# Patient Record
Sex: Female | Born: 1935
Health system: Southern US, Community
[De-identification: ages and names within clinical notes are randomized; demographics above are authoritative.]

## PROBLEM LIST (undated history)

## (undated) DIAGNOSIS — J40 Bronchitis, not specified as acute or chronic: Secondary | ICD-10-CM

## (undated) DIAGNOSIS — I10 Essential (primary) hypertension: Secondary | ICD-10-CM

## (undated) DIAGNOSIS — F419 Anxiety disorder, unspecified: Secondary | ICD-10-CM

## (undated) DIAGNOSIS — I779 Disorder of arteries and arterioles, unspecified: Secondary | ICD-10-CM

## (undated) DIAGNOSIS — I340 Nonrheumatic mitral (valve) insufficiency: Secondary | ICD-10-CM

## (undated) DIAGNOSIS — K449 Diaphragmatic hernia without obstruction or gangrene: Secondary | ICD-10-CM

## (undated) DIAGNOSIS — I739 Peripheral vascular disease, unspecified: Secondary | ICD-10-CM

## (undated) DIAGNOSIS — I350 Nonrheumatic aortic (valve) stenosis: Secondary | ICD-10-CM

## (undated) DIAGNOSIS — J189 Pneumonia, unspecified organism: Secondary | ICD-10-CM

## (undated) DIAGNOSIS — M1990 Unspecified osteoarthritis, unspecified site: Secondary | ICD-10-CM

## (undated) DIAGNOSIS — L309 Dermatitis, unspecified: Secondary | ICD-10-CM

## (undated) DIAGNOSIS — E785 Hyperlipidemia, unspecified: Secondary | ICD-10-CM

## (undated) DIAGNOSIS — Z888 Allergy status to other drugs, medicaments and biological substances status: Secondary | ICD-10-CM

## (undated) DIAGNOSIS — I208 Other forms of angina pectoris: Secondary | ICD-10-CM

## (undated) DIAGNOSIS — I2510 Atherosclerotic heart disease of native coronary artery without angina pectoris: Secondary | ICD-10-CM

## (undated) DIAGNOSIS — I219 Acute myocardial infarction, unspecified: Secondary | ICD-10-CM

## (undated) DIAGNOSIS — R011 Cardiac murmur, unspecified: Secondary | ICD-10-CM

## (undated) DIAGNOSIS — I480 Paroxysmal atrial fibrillation: Secondary | ICD-10-CM

## (undated) DIAGNOSIS — I371 Nonrheumatic pulmonary valve insufficiency: Secondary | ICD-10-CM

## (undated) DIAGNOSIS — I5189 Other ill-defined heart diseases: Secondary | ICD-10-CM

## (undated) DIAGNOSIS — M419 Scoliosis, unspecified: Secondary | ICD-10-CM

## (undated) DIAGNOSIS — M797 Fibromyalgia: Secondary | ICD-10-CM

## (undated) DIAGNOSIS — I071 Rheumatic tricuspid insufficiency: Secondary | ICD-10-CM

## (undated) DIAGNOSIS — C44601 Unspecified malignant neoplasm of skin of unspecified upper limb, including shoulder: Secondary | ICD-10-CM

## (undated) DIAGNOSIS — I639 Cerebral infarction, unspecified: Secondary | ICD-10-CM

## (undated) DIAGNOSIS — K219 Gastro-esophageal reflux disease without esophagitis: Secondary | ICD-10-CM

## (undated) HISTORY — DX: Scoliosis, unspecified: M41.9

## (undated) HISTORY — DX: Fibromyalgia: M79.7

## (undated) HISTORY — DX: Nonrheumatic aortic (valve) stenosis: I35.0

## (undated) HISTORY — DX: Other forms of angina pectoris: I20.8

## (undated) HISTORY — DX: Nonrheumatic mitral (valve) insufficiency: I34.0

## (undated) HISTORY — DX: Paroxysmal atrial fibrillation: I48.0

## (undated) HISTORY — DX: Other ill-defined heart diseases: I51.89

## (undated) HISTORY — PX: TONSILLECTOMY: SUR1361

## (undated) HISTORY — DX: Allergy status to other drugs, medicaments and biological substances: Z88.8

## (undated) HISTORY — DX: Hyperlipidemia, unspecified: E78.5

## (undated) HISTORY — DX: Unspecified osteoarthritis, unspecified site: M19.90

## (undated) HISTORY — DX: Rheumatic tricuspid insufficiency: I07.1

## (undated) HISTORY — DX: Diaphragmatic hernia without obstruction or gangrene: K44.9

## (undated) HISTORY — PX: CORONARY ANGIOPLASTY WITH STENT PLACEMENT: SHX49

## (undated) HISTORY — PX: TUMOR REMOVAL: SHX12

## (undated) HISTORY — DX: Acute myocardial infarction, unspecified: I21.9

## (undated) HISTORY — DX: Nonrheumatic pulmonary valve insufficiency: I37.1

## (undated) HISTORY — DX: Peripheral vascular disease, unspecified: I73.9

## (undated) HISTORY — DX: Dermatitis, unspecified: L30.9

## (undated) HISTORY — DX: Atherosclerotic heart disease of native coronary artery without angina pectoris: I25.10

## (undated) HISTORY — DX: Essential (primary) hypertension: I10

## (undated) HISTORY — DX: Gastro-esophageal reflux disease without esophagitis: K21.9

## (undated) HISTORY — DX: Disorder of arteries and arterioles, unspecified: I77.9

## (undated) HISTORY — PX: EYE SURGERY: SHX253

---

## 1974-05-23 HISTORY — PX: ABDOMINAL HYSTERECTOMY: SHX81

## 2001-05-23 DIAGNOSIS — I219 Acute myocardial infarction, unspecified: Secondary | ICD-10-CM

## 2001-05-23 HISTORY — DX: Acute myocardial infarction, unspecified: I21.9

## 2001-06-23 DIAGNOSIS — I2510 Atherosclerotic heart disease of native coronary artery without angina pectoris: Secondary | ICD-10-CM

## 2001-06-23 HISTORY — DX: Atherosclerotic heart disease of native coronary artery without angina pectoris: I25.10

## 2001-06-30 ENCOUNTER — Inpatient Hospital Stay (HOSPITAL_COMMUNITY): Admission: EM | Admit: 2001-06-30 | Discharge: 2001-07-02 | Payer: Self-pay

## 2001-07-11 ENCOUNTER — Inpatient Hospital Stay (HOSPITAL_COMMUNITY): Admission: EM | Admit: 2001-07-11 | Discharge: 2001-07-14 | Payer: Self-pay | Admitting: Emergency Medicine

## 2001-07-11 ENCOUNTER — Encounter: Payer: Self-pay | Admitting: Emergency Medicine

## 2001-07-12 ENCOUNTER — Encounter: Payer: Self-pay | Admitting: Family Medicine

## 2002-02-09 ENCOUNTER — Inpatient Hospital Stay (HOSPITAL_COMMUNITY): Admission: EM | Admit: 2002-02-09 | Discharge: 2002-02-11 | Payer: Self-pay | Admitting: *Deleted

## 2002-02-11 ENCOUNTER — Encounter: Payer: Self-pay | Admitting: *Deleted

## 2002-07-09 ENCOUNTER — Encounter: Payer: Self-pay | Admitting: Cardiology

## 2002-07-09 ENCOUNTER — Ambulatory Visit (HOSPITAL_COMMUNITY): Admission: RE | Admit: 2002-07-09 | Discharge: 2002-07-09 | Payer: Self-pay | Admitting: Cardiology

## 2002-11-18 ENCOUNTER — Ambulatory Visit (HOSPITAL_COMMUNITY): Admission: RE | Admit: 2002-11-18 | Discharge: 2002-11-18 | Payer: Self-pay | Admitting: Family Medicine

## 2003-03-21 ENCOUNTER — Ambulatory Visit (HOSPITAL_COMMUNITY): Admission: RE | Admit: 2003-03-21 | Discharge: 2003-03-21 | Payer: Self-pay | Admitting: Cardiology

## 2003-07-25 ENCOUNTER — Ambulatory Visit (HOSPITAL_COMMUNITY): Admission: RE | Admit: 2003-07-25 | Discharge: 2003-07-25 | Payer: Self-pay | Admitting: Cardiology

## 2004-02-13 ENCOUNTER — Encounter: Admission: RE | Admit: 2004-02-13 | Discharge: 2004-02-13 | Payer: Self-pay | Admitting: Family Medicine

## 2004-04-19 ENCOUNTER — Ambulatory Visit: Payer: Self-pay | Admitting: Internal Medicine

## 2004-05-18 ENCOUNTER — Ambulatory Visit: Payer: Self-pay | Admitting: Cardiology

## 2004-06-14 ENCOUNTER — Ambulatory Visit: Payer: Self-pay | Admitting: Internal Medicine

## 2004-07-12 ENCOUNTER — Ambulatory Visit: Payer: Self-pay | Admitting: *Deleted

## 2004-08-09 ENCOUNTER — Ambulatory Visit: Payer: Self-pay | Admitting: *Deleted

## 2004-08-16 ENCOUNTER — Ambulatory Visit: Payer: Self-pay | Admitting: Pulmonary Disease

## 2004-08-23 ENCOUNTER — Ambulatory Visit: Payer: Self-pay | Admitting: Cardiology

## 2004-09-13 ENCOUNTER — Ambulatory Visit: Payer: Self-pay | Admitting: Cardiology

## 2004-10-11 ENCOUNTER — Ambulatory Visit: Payer: Self-pay | Admitting: Cardiology

## 2004-11-08 ENCOUNTER — Ambulatory Visit: Payer: Self-pay | Admitting: Cardiology

## 2004-11-19 ENCOUNTER — Ambulatory Visit: Payer: Self-pay | Admitting: Pulmonary Disease

## 2004-12-07 ENCOUNTER — Ambulatory Visit: Payer: Self-pay | Admitting: Cardiology

## 2004-12-10 ENCOUNTER — Ambulatory Visit: Payer: Self-pay | Admitting: Cardiology

## 2004-12-24 ENCOUNTER — Ambulatory Visit: Payer: Self-pay

## 2005-01-03 ENCOUNTER — Ambulatory Visit: Payer: Self-pay | Admitting: Cardiology

## 2005-01-11 ENCOUNTER — Ambulatory Visit: Payer: Self-pay | Admitting: Pulmonary Disease

## 2005-01-21 HISTORY — PX: CORONARY ARTERY BYPASS GRAFT: SHX141

## 2005-01-25 ENCOUNTER — Ambulatory Visit: Payer: Self-pay | Admitting: Cardiology

## 2005-01-31 ENCOUNTER — Inpatient Hospital Stay (HOSPITAL_BASED_OUTPATIENT_CLINIC_OR_DEPARTMENT_OTHER): Admission: RE | Admit: 2005-01-31 | Discharge: 2005-01-31 | Payer: Self-pay | Admitting: Cardiology

## 2005-02-04 ENCOUNTER — Inpatient Hospital Stay (HOSPITAL_COMMUNITY): Admission: AD | Admit: 2005-02-04 | Discharge: 2005-02-18 | Payer: Self-pay | Admitting: Cardiology

## 2005-02-06 ENCOUNTER — Ambulatory Visit: Payer: Self-pay | Admitting: Cardiology

## 2005-02-18 ENCOUNTER — Ambulatory Visit: Payer: Self-pay | Admitting: Physical Medicine & Rehabilitation

## 2005-02-18 ENCOUNTER — Inpatient Hospital Stay (HOSPITAL_COMMUNITY)
Admission: RE | Admit: 2005-02-18 | Discharge: 2005-02-25 | Payer: Self-pay | Admitting: Physical Medicine & Rehabilitation

## 2005-03-04 ENCOUNTER — Ambulatory Visit: Payer: Self-pay | Admitting: Cardiology

## 2005-03-11 ENCOUNTER — Ambulatory Visit: Payer: Self-pay | Admitting: Cardiology

## 2005-04-13 ENCOUNTER — Ambulatory Visit: Payer: Self-pay | Admitting: Cardiology

## 2005-04-20 ENCOUNTER — Ambulatory Visit: Payer: Self-pay | Admitting: Cardiology

## 2005-04-27 ENCOUNTER — Ambulatory Visit: Payer: Self-pay | Admitting: *Deleted

## 2005-05-11 ENCOUNTER — Ambulatory Visit: Payer: Self-pay | Admitting: Internal Medicine

## 2005-06-01 ENCOUNTER — Ambulatory Visit: Payer: Self-pay | Admitting: *Deleted

## 2005-06-07 ENCOUNTER — Ambulatory Visit: Payer: Self-pay | Admitting: Cardiology

## 2005-06-22 ENCOUNTER — Ambulatory Visit: Payer: Self-pay | Admitting: Cardiology

## 2005-07-06 ENCOUNTER — Ambulatory Visit: Payer: Self-pay | Admitting: Internal Medicine

## 2005-08-03 ENCOUNTER — Ambulatory Visit: Payer: Self-pay | Admitting: Cardiology

## 2005-09-19 ENCOUNTER — Ambulatory Visit: Payer: Self-pay | Admitting: Pulmonary Disease

## 2005-10-26 ENCOUNTER — Ambulatory Visit: Payer: Self-pay | Admitting: Pulmonary Disease

## 2005-11-25 ENCOUNTER — Ambulatory Visit: Payer: Self-pay | Admitting: Internal Medicine

## 2005-11-25 ENCOUNTER — Ambulatory Visit: Payer: Self-pay | Admitting: Family Medicine

## 2005-12-16 ENCOUNTER — Ambulatory Visit: Payer: Self-pay

## 2006-02-03 ENCOUNTER — Ambulatory Visit: Payer: Self-pay | Admitting: Family Medicine

## 2006-04-01 ENCOUNTER — Emergency Department (HOSPITAL_COMMUNITY): Admission: EM | Admit: 2006-04-01 | Discharge: 2006-04-01 | Payer: Self-pay | Admitting: Emergency Medicine

## 2006-04-07 ENCOUNTER — Ambulatory Visit: Payer: Self-pay | Admitting: Family Medicine

## 2006-04-07 ENCOUNTER — Ambulatory Visit: Payer: Self-pay | Admitting: Pulmonary Disease

## 2006-04-24 ENCOUNTER — Ambulatory Visit: Payer: Self-pay | Admitting: Cardiology

## 2006-05-12 ENCOUNTER — Ambulatory Visit: Payer: Self-pay | Admitting: Emergency Medicine

## 2006-05-26 ENCOUNTER — Ambulatory Visit: Payer: Self-pay

## 2006-08-02 IMAGING — CR DG CHEST 1V PORT
1 series · 1 of 1 positions shown · non-contrast
Comparison: 02/15/05.

CLINICAL DATA: Chest pain.  CABG. 
 PORTABLE CHEST - 1 VIEW - 02/16/05 AT 1911 HOURS:

[view not recorded]
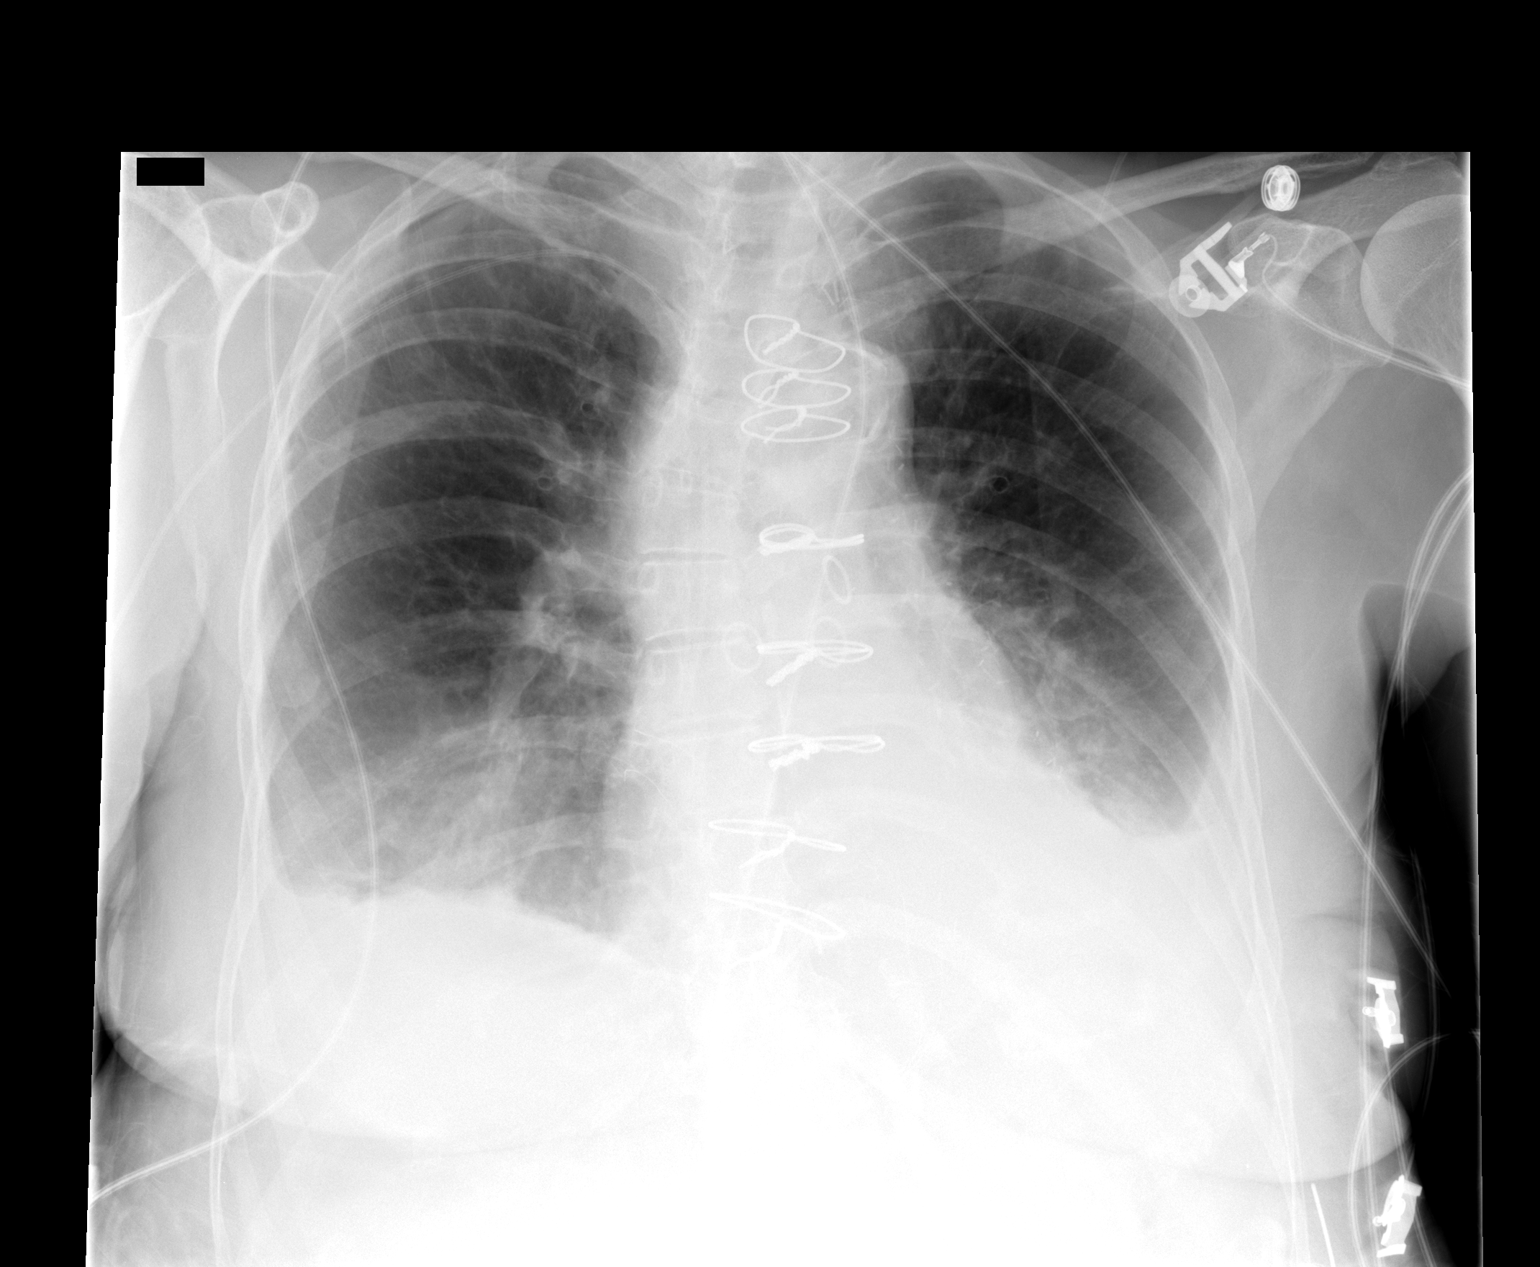

[1 of 1 positions shown; findings below may reference images not displayed]

FINDINGS: Left lower lobe atelectasis with bilateral effusions again noted and stable.  The right chest tube has been removed.   No pneumothorax.  Cardiac size is stable status post CABG.  Bones are unremarkable.
IMPRESSION: 1.  Right chest removed.  No pneumothorax. 
 2.  Stable left lower lobe atelectasis and bilateral effusions.

## 2006-09-08 ENCOUNTER — Ambulatory Visit: Payer: Self-pay | Admitting: Internal Medicine

## 2006-10-11 ENCOUNTER — Ambulatory Visit: Payer: Self-pay | Admitting: Family Medicine

## 2006-12-15 ENCOUNTER — Ambulatory Visit: Payer: Self-pay | Admitting: Internal Medicine

## 2006-12-15 ENCOUNTER — Ambulatory Visit: Payer: Self-pay | Admitting: Pulmonary Disease

## 2007-03-07 ENCOUNTER — Ambulatory Visit: Payer: Self-pay | Admitting: Family Medicine

## 2007-04-13 ENCOUNTER — Ambulatory Visit: Payer: Self-pay | Admitting: Pulmonary Disease

## 2007-04-17 ENCOUNTER — Ambulatory Visit: Payer: Self-pay | Admitting: Family Medicine

## 2007-04-27 ENCOUNTER — Ambulatory Visit: Payer: Self-pay | Admitting: Internal Medicine

## 2007-05-07 ENCOUNTER — Encounter: Admission: RE | Admit: 2007-05-07 | Discharge: 2007-05-07 | Payer: Self-pay | Admitting: Family Medicine

## 2007-06-01 ENCOUNTER — Ambulatory Visit: Payer: Self-pay

## 2007-10-19 ENCOUNTER — Ambulatory Visit: Payer: Self-pay | Admitting: Pulmonary Disease

## 2007-10-19 DIAGNOSIS — J45909 Unspecified asthma, uncomplicated: Secondary | ICD-10-CM | POA: Insufficient documentation

## 2007-10-19 DIAGNOSIS — R0602 Shortness of breath: Secondary | ICD-10-CM | POA: Insufficient documentation

## 2007-10-19 DIAGNOSIS — R059 Cough, unspecified: Secondary | ICD-10-CM | POA: Insufficient documentation

## 2007-10-19 DIAGNOSIS — R05 Cough: Secondary | ICD-10-CM | POA: Insufficient documentation

## 2007-10-23 ENCOUNTER — Encounter: Payer: Self-pay | Admitting: Emergency Medicine

## 2007-11-30 ENCOUNTER — Ambulatory Visit: Payer: Self-pay | Admitting: Family Medicine

## 2007-11-30 ENCOUNTER — Ambulatory Visit: Payer: Self-pay

## 2007-12-04 ENCOUNTER — Ambulatory Visit: Payer: Self-pay | Admitting: Family Medicine

## 2007-12-07 ENCOUNTER — Encounter (HOSPITAL_COMMUNITY): Admission: RE | Admit: 2007-12-07 | Discharge: 2008-02-13 | Payer: Self-pay | Admitting: Gastroenterology

## 2008-01-25 ENCOUNTER — Ambulatory Visit: Payer: Self-pay | Admitting: Internal Medicine

## 2008-03-07 ENCOUNTER — Ambulatory Visit: Payer: Self-pay | Admitting: Family Medicine

## 2008-03-18 ENCOUNTER — Telehealth (INDEPENDENT_AMBULATORY_CARE_PROVIDER_SITE_OTHER): Payer: Self-pay | Admitting: *Deleted

## 2008-04-04 ENCOUNTER — Ambulatory Visit: Payer: Self-pay | Admitting: Pulmonary Disease

## 2008-05-21 ENCOUNTER — Telehealth: Payer: Self-pay | Admitting: Pulmonary Disease

## 2008-05-23 HISTORY — PX: CAROTID ENDARTERECTOMY: SUR193

## 2008-06-06 ENCOUNTER — Ambulatory Visit: Payer: Self-pay | Admitting: Internal Medicine

## 2008-07-18 ENCOUNTER — Ambulatory Visit: Payer: Self-pay | Admitting: Family Medicine

## 2008-08-07 ENCOUNTER — Ambulatory Visit: Payer: Self-pay | Admitting: Family Medicine

## 2008-10-10 ENCOUNTER — Telehealth: Payer: Self-pay | Admitting: Adult Health

## 2008-10-10 ENCOUNTER — Ambulatory Visit: Payer: Self-pay | Admitting: Pulmonary Disease

## 2008-12-05 ENCOUNTER — Encounter: Payer: Self-pay | Admitting: Cardiovascular Disease

## 2008-12-05 ENCOUNTER — Ambulatory Visit: Payer: Self-pay | Admitting: Internal Medicine

## 2008-12-08 ENCOUNTER — Telehealth: Payer: Self-pay | Admitting: Internal Medicine

## 2008-12-09 ENCOUNTER — Ambulatory Visit: Payer: Self-pay | Admitting: Vascular Surgery

## 2008-12-16 ENCOUNTER — Ambulatory Visit: Payer: Self-pay | Admitting: Internal Medicine

## 2008-12-22 ENCOUNTER — Telehealth (INDEPENDENT_AMBULATORY_CARE_PROVIDER_SITE_OTHER): Payer: Self-pay | Admitting: *Deleted

## 2008-12-23 ENCOUNTER — Encounter: Payer: Self-pay | Admitting: Cardiovascular Disease

## 2008-12-23 ENCOUNTER — Encounter: Payer: Self-pay | Admitting: Internal Medicine

## 2008-12-23 ENCOUNTER — Ambulatory Visit: Payer: Self-pay

## 2008-12-25 ENCOUNTER — Telehealth: Payer: Self-pay | Admitting: Internal Medicine

## 2008-12-31 ENCOUNTER — Inpatient Hospital Stay (HOSPITAL_COMMUNITY): Admission: RE | Admit: 2008-12-31 | Discharge: 2009-01-01 | Payer: Self-pay | Admitting: Vascular Surgery

## 2008-12-31 ENCOUNTER — Ambulatory Visit: Payer: Self-pay | Admitting: Vascular Surgery

## 2008-12-31 ENCOUNTER — Encounter: Payer: Self-pay | Admitting: Vascular Surgery

## 2009-01-09 ENCOUNTER — Ambulatory Visit: Payer: Self-pay | Admitting: Family Medicine

## 2009-01-20 ENCOUNTER — Ambulatory Visit: Payer: Self-pay | Admitting: Vascular Surgery

## 2009-03-12 ENCOUNTER — Telehealth: Payer: Self-pay | Admitting: Internal Medicine

## 2009-05-01 ENCOUNTER — Ambulatory Visit: Payer: Self-pay | Admitting: Pulmonary Disease

## 2009-07-17 ENCOUNTER — Ambulatory Visit: Payer: Self-pay | Admitting: Vascular Surgery

## 2009-08-28 ENCOUNTER — Ambulatory Visit: Payer: Self-pay | Admitting: Family Medicine

## 2009-09-22 ENCOUNTER — Ambulatory Visit: Payer: Self-pay | Admitting: Vascular Surgery

## 2009-09-22 ENCOUNTER — Encounter: Payer: Self-pay | Admitting: Pulmonary Disease

## 2009-11-06 ENCOUNTER — Ambulatory Visit: Payer: Self-pay | Admitting: Pulmonary Disease

## 2010-02-05 ENCOUNTER — Ambulatory Visit: Payer: Self-pay | Admitting: Family Medicine

## 2010-02-09 ENCOUNTER — Ambulatory Visit (HOSPITAL_COMMUNITY): Admission: RE | Admit: 2010-02-09 | Discharge: 2010-02-09 | Payer: Self-pay | Admitting: Family Medicine

## 2010-04-26 ENCOUNTER — Ambulatory Visit: Payer: Self-pay | Admitting: Family Medicine

## 2010-04-27 ENCOUNTER — Encounter: Admission: RE | Admit: 2010-04-27 | Discharge: 2010-04-27 | Payer: Self-pay | Admitting: Family Medicine

## 2010-05-03 ENCOUNTER — Telehealth (INDEPENDENT_AMBULATORY_CARE_PROVIDER_SITE_OTHER): Payer: Self-pay | Admitting: *Deleted

## 2010-05-28 ENCOUNTER — Ambulatory Visit
Admission: RE | Admit: 2010-05-28 | Discharge: 2010-05-28 | Payer: Self-pay | Source: Home / Self Care | Attending: Pulmonary Disease | Admitting: Pulmonary Disease

## 2010-06-03 ENCOUNTER — Encounter: Payer: Self-pay | Admitting: Internal Medicine

## 2010-06-03 ENCOUNTER — Ambulatory Visit
Admission: RE | Admit: 2010-06-03 | Discharge: 2010-06-03 | Payer: Self-pay | Source: Home / Self Care | Attending: Internal Medicine | Admitting: Internal Medicine

## 2010-06-11 ENCOUNTER — Ambulatory Visit: Admission: RE | Admit: 2010-06-11 | Discharge: 2010-06-11 | Payer: Self-pay | Source: Home / Self Care

## 2010-06-11 ENCOUNTER — Ambulatory Visit (HOSPITAL_COMMUNITY)
Admission: RE | Admit: 2010-06-11 | Discharge: 2010-06-11 | Payer: Self-pay | Source: Home / Self Care | Attending: Internal Medicine | Admitting: Internal Medicine

## 2010-06-16 ENCOUNTER — Encounter: Payer: Self-pay | Admitting: Internal Medicine

## 2010-06-24 NOTE — Assessment & Plan Note (Signed)
Summary: per pt call/cb   Visit Type:  Follow-up Primary Provider/Referring Provider:  Dr. Sharlot Gowda  CC:  Pt c/o SOB with exertion, productive cough with yellow/green/clear mucus, and choking.  History of Present Illness: 77/F with kyphoscoliosis & chronic productive cough since 2003.  This is productive of mostly clear phlegm and Mucinex helps somewhat.  PFTs in March 2004 have shown significant improvement in FEV1 with bronchodilator, hence maintained on inhaled  steroids.  She also has hiatal hernia with severe GERD symptoms and takes PPI twice a day with some breakthrough episodes of reflux. She is being treated as mild perssitent asthma , Spirometry 5/09 >> showed nml lung function. Did not tolerate advair. Off singulair - body aches, on claritin instead She describes an occasional episode of strangling in her throat, lasting a few minutes, that has been ongoing for many years, note negative ENT exam.  An ACE inhibitor was discontinued in April 2008.   Her dyspnea is felt to be multifactorial - kyphoscoliosis, AS & mild asthma + deconditioning Her chronic cough is also multifactorial - hiatal hernia/ GERD, mild asthma , sinus drainage.    November 06, 2009-- Needs 100mg  of tessalon-insurance will cover.   May 28, 2010 4:30 PM  c/o pressure on her 'throat', CT neck - cx degen disease, Ba swallow confirmed reflux, sliding hernia  - started nexium, has appt with dr Loreta Ave. ESM 3/6, AVA noted 1.1 cm2 on echo 8/10 Denies chest pain, orthopnea, hemoptysis, fever, n/v/d, edema, headache.   Preventive Screening-Counseling & Management  Alcohol-Tobacco     Smoking Status: never  Current Medications (verified): 1)  Cardizem Cd 120 Mg Cp24 (Diltiazem Hcl Coated Beads) .... Take One Tablet By Mouth Once Daily. 2)  Plavix 75 Mg Tabs (Clopidogrel Bisulfate) .... Take One Tablet By Mouth Once Daily. 3)  Omeprazole 20 Mg  Tbec (Omeprazole) .Marland Kitchen.. 1 By Mouth Two Times A Day 4)  Crestor 40 Mg  Tabs  (Rosuvastatin Calcium) .Marland Kitchen.. 1 By Mouth Daily 5)  Isosorbide Dinitrate 30 Mg  Tabs (Isosorbide Dinitrate) .Marland Kitchen.. 1 Tab By Mouth Two Times A Day 6)  Diovan 40 Mg  Tabs (Valsartan) .Marland Kitchen.. 1 By Mouth Daily 7)  Toprol Xl 25 Mg  Tb24 (Metoprolol Succinate) .Marland Kitchen.. 1 By Mouth Daily 8)  Claritin 10 Mg Tabs (Loratadine) .... As Needed 9)  Nasonex 50 Mcg/act Susp (Mometasone Furoate) .... 2 Sprays Each Nostril Daily 10)  Diazepam 2 Mg Tabs (Diazepam) .... As Needed 11)  Flovent Hfa 110 Mcg/act  Aero (Fluticasone Propionate  Hfa) .... Two  Puffs Twice Daily 12)  Mucinex Dm 30-600 Mg Xr12h-Tab (Dextromethorphan-Guaifenesin) .Marland Kitchen.. 1-2 Every 12 Hours As Needed 13)  Guaifenesin-Dm 100-10 Mg/84ml Syrp (Dextromethorphan-Guaifenesin) .... Per Bottle 14)  Ventolin Hfa 108 (90 Base) Mcg/act  Aers (Albuterol Sulfate) .Marland Kitchen.. 1-2 Puffs Every 4-6 Hours As Needed 15)  Nitrostat 0.4 Mg Subl (Nitroglycerin) .Marland Kitchen.. 1 Tablet Under Tongue At Onset of Chest Pain; You May Repeat Every 5 Minutes For Up To 3 Doses. 16)  Flovent Hfa 110 Mcg/act Aero (Fluticasone Propionate  Hfa) .... Daily 17)  Benzonatate 100 Mg Caps (Benzonatate) .... Take 1 Tablet By Mouth Three Times A Day As Needed Cough 18)  Orajel 10 % Gel (Benzocaine) .... As Needed  Allergies (verified): 1)  ! Asa 2)  ! * Amitriptylline 3)  ! 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  The patient denies anorexia, fever, weight loss, weight gain, vision loss, decreased hearing, hoarseness, chest pain, syncope, peripheral edema, headaches, hemoptysis,  abdominal pain, melena, hematochezia, severe indigestion/heartburn, hematuria, muscle weakness, suspicious skin lesions, difficulty walking, depression, unusual weight change, abnormal bleeding, enlarged lymph nodes, and angioedema.    Vital Signs:  Patient profile:   77 year old female Height:      55 inches Weight:      145 pounds BMI:     33.82 O2 Sat:      98 % on Room air Temp:     98.8 degrees F oral Pulse rate:   66 / minute BP sitting:   110 / 60  (left arm) Cuff size:   regular  Vitals Entered By: Zackery Barefoot CMA (May 28, 2010 4:09 PM)  O2 Flow:  Room air CC: Pt c/o SOB with exertion, productive cough with yellow/green/clear mucus, choking Comments Medications reviewed with patient Verified contact number and pharmacy with patient Zackery Barefoot CMA  May 28, 2010 4:09 PM    Physical Exam  Additional Exam:  GEN: A/Ox3; pleasant , NAD HEENT:  [place]/AT, , EACs-clear, TMs-wnl, NOSE-clear, THROAT-clear NECK:  Supple w/ fair ROM; no JVD; normal carotid impulses w/o bruits; no thyromegaly or nodules palpated; no lymphadenopathy. RESP  hyphotic chest,Clear to P & A; w/o, wheezes/ rales/ or rhonchi, decreased in bases CARD:  RRR, no m/r/g   [place]: Warm bil,  no calf tenderness edema, clubbing, pulses intact    Impression & Recommendations:  Problem # 1:  ASTHMA, PERSISTENT, MILD (ICD-493.90) Dyspne ais multifactorial - mild asthma controlled on flovent, pro air for rescue only  Problem # 2:  COUGH (ICD-786.2)  Predominant GERD some element of chronic bronchitis, non smoker. ct tessalon, ct mucinex upper airway control with nasonex ,claritin & astepro (trial)  Orders: Est. Patient Level III ([address]) Prescription Created Electronically [phone])  Medications Added to Medication List This Visit: 1)  Proair Hfa 108 (90 Base) Mcg/act Aers (Albuterol sulfate) .... 2 puffs three times a day as needed 2)  Orajel 10 % Gel (Benzocaine) .... As needed  Patient  Instructions: 1)  Please schedule a follow-up appointment in 6 months with TP 2)  Refills sent 3)  Ok to use pro-air instead of ventolin 4)  Copy sent [id number] Prescriptions: FLOVENT HFA 110 MCG/ACT  AERO (FLUTICASONE PROPIONATE  HFA) Two  puffs twice daily  #1 x 5   Entered and Authorized by:   Comer Locket Vassie Loll MD   Signed by:   Comer Locket Vassie Loll MD on 05/28/2010   Method used:   Electronically to        [facility] [phone]* (retail)       [address]       [place], [place]  [address]       Ph: [phone] or [phone]       Fax: [phone]   RxID:   [id number] PROAIR HFA 108 (90 BASE) MCG/ACT AERS (ALBUTEROL SULFATE) 2 puffs three times a day as needed  #1 x 5   Entered and Authorized by:   Comer Locket. Vassie Loll MD  Signed by:   Comer Locket Vassie Loll MD on 05/28/2010   Method used:   Electronically to        [facility] [phone]* (retail)       [address]       [place], [place]  [address]       Ph: [phone] or [phone]       Fax: [phone]   RxID:   [id number]

## 2010-06-24 NOTE — Assessment & Plan Note (Signed)
Summary: Nikia.Burrow. gd  Medications Added VENTOLIN HFA 108 (90 BASE) MCG/ACT AERS (ALBUTEROL SULFATE) as needed      Allergies Added:   Visit Type:  Follow-up Primary Provider:  Dr. Sharlot Gowda  CC:  chest pain ocasionally.  History of Present Illness: Ms. Minch is a 77 year old woman with multiple medical problems including coronary artery disease, status post bypass surgery in September 2006 as well as a Cox-Maze procedure for atrial fibrillation.  She also has carotid artery stenosis s/p R CEA.    Echo 8/10: Echo EF 50-55% with mild AS (mean gradient 9) and moderate MR Myoview 8/10: EF 70% small lateral infarct no ischemia  Remainder of medical history notable for fibromyalgia, severe kyphoscoliosis with a huge hiatal hernia and gastroesophageal reflux disease.  Here with f/u with her daughter. Gets around slowly with cane. Occasional mild CP about 1x/week. Unchanged. Worse with stress. Takes NTG which helps some. Chronic dyspnea. Occasional edema a end of day.     Current Medications (verified): 1)  Cardizem Cd 120 Mg Cp24 (Diltiazem Hcl Coated Beads) .... Take One Tablet By Mouth Once Daily. 2)  Plavix 75 Mg Tabs (Clopidogrel Bisulfate) .... Take One Tablet By Mouth Once Daily. 3)  Omeprazole 20 Mg  Tbec (Omeprazole) .Marland Kitchen.. 1 By Mouth Two Times A Day 4)  Crestor 40 Mg  Tabs (Rosuvastatin Calcium) .Marland Kitchen.. 1 By Mouth Daily 5)  Isosorbide Dinitrate 30 Mg  Tabs (Isosorbide Dinitrate) .Marland Kitchen.. 1 Tab By Mouth Two Times A Day 6)  Diovan 40 Mg  Tabs (Valsartan) .Marland Kitchen.. 1 By Mouth Daily 7)  Toprol Xl 25 Mg  Tb24 (Metoprolol Succinate) .Marland Kitchen.. 1 By Mouth Daily 8)  Claritin 10 Mg Tabs (Loratadine) .... As Needed 9)  Nasonex 50 Mcg/act Susp (Mometasone Furoate) .... 2 Sprays Each Nostril Daily 10)  Diazepam 2 Mg Tabs (Diazepam) .... As Needed 11)  Flovent Hfa 110 Mcg/act  Aero (Fluticasone Propionate  Hfa) .... Two  Puffs Twice Daily 12)  Mucinex Dm 30-600 Mg Xr12h-Tab (Dextromethorphan-Guaifenesin) .Marland Kitchen..  1-2 Every 12 Hours As Needed 13)  Guaifenesin-Dm 100-10 Mg/57ml Syrp (Dextromethorphan-Guaifenesin) .... Per Bottle 14)  Ventolin Hfa 108 (90 Base) Mcg/act Aers (Albuterol Sulfate) .... As Needed 15)  Nitrostat 0.4 Mg Subl (Nitroglycerin) .Marland Kitchen.. 1 Tablet Under Tongue At Onset of Chest Pain; You May Repeat Every 5 Minutes For Up To 3 Doses. 16)  Flovent Hfa 110 Mcg/act Aero (Fluticasone Propionate  Hfa) .... Daily 17)  Benzonatate 100 Mg Caps (Benzonatate) .... Take 1 Tablet By Mouth Three Times A Day As Needed Cough 18)  Orajel 10 % Gel (Benzocaine) .... As Needed  Allergies (verified): 1)  ! Asa 2)  ! * Amitriptylline 3)  ! Elavil  Past History:  Past Medical History: Last updated: 12/16/2008 DYSPNEA (ICD-786.05) ASTHMA, PERSISTENT, MILD (ICD-493.90) COUGH (ICD-786.2) CAD s/p CABG   --s/p lateral MI Carotid stenosis Large hiatal hernia  Review of Systems       As per HPI and past medical history; otherwise all systems negative.   Vital Signs:  Patient profile:   77 year old female Height:      55 inches Weight:      143 pounds BMI:     33.36 Pulse rate:   66 / minute BP sitting:   122 / 64  (left arm) Cuff size:   regular  Vitals Entered By: Hardin Negus, RMA (June 03, 2010 3:41 PM)  Physical Exam  General:  elderly frail in WC no resp difficulty HEENT: normal  Neck: supple. no JVD. Carotids 2+ bilat; B bruits. R CEA scar No lymphadenopathy or thryomegaly appreciated. Cor: PMI nondisplaced. Regular rate & rhythm. No rubs, gallops. soft SEM at RSB and apex Lungs: clear.. no wheeze Abdomen: soft, nontender, nondistended. No hepatosplenomegaly.  Extremities: no cyanosis, clubbing, rash, edema Neuro: alert & orientedx3, cranial nerves grossly intact. moves all 4 extremities w/o difficulty. affect pleasant    Impression & Recommendations:  Problem # 1:  CAD, NATIVE VESSEL (ICD-414.01) Stable. Doubt CP is ischemic given normal myoview. Agree with GI work-up.   Otherwise continue current regimen.  Problem # 2:  AORTIC STENOSIS, CALCIFIC (ICD-424.1)  Mild by echo and on exam. Will get f/u echo to continue surveillance.   Orders: Echocardiogram (Echo)  Other Orders: EKG w/ Interpretation (93000)  Patient Instructions: 1)  Your physician recommends that you schedule a follow-up appointment in: 1 year 2)  Your physician recommends that you continue on your current medications as directed. Please refer to the Current Medication list given to you today. 3)  Your physician has requested that you have an echocardiogram.  Echocardiography is a painless test that uses sound waves to create images of your heart. It provides your doctor with information about the size and shape of your heart and how well your heart's chambers and valves are working.  This procedure takes approximately one hour. There are no restrictions for this procedure.

## 2010-06-24 NOTE — Letter (Signed)
Summary: [facility]   Imported By: Lester [place] 10/15/2009 08:25:52  _____________________________________________________________________  External Attachment:    Type:   Image     Comment:   External Document

## 2010-06-24 NOTE — Progress Notes (Signed)
Summary: refill on benzonatate  Phone Note Call from Patient Call back at Home Phone [phone]   Caller: Patient Call For: [name] Reason for Call: Refill Medication Summary of Call: Requests refill on benzonatate 100mg .//[facility] [address] Initial call taken by: Darletta Moll,  May 03, 2010 9:32 AM  Follow-up for Phone Call        Spoke with pt and she states that she still has a cough on and off and takes the benzonatate as needd for this. COugh is mostly dry and is no worse then last OV.  SHe has an appt to see Dr. Vassie Loll on 05-28-10.  Please advise if ok to give refill.Carron Curie CMA  May 03, 2010 11:28 AM   Additional Follow-up for Phone Call Additional follow up Details #1::        that is fine     Additional Follow-up for Phone Call Additional follow up Details #2::    Called, spoke with pt.  She is aware rx sent to pharm and verbalized understanding  Follow-up by: Gweneth Dimitri RN,  May 03, 2010 1:12 PM  Prescriptions: BENZONATATE 100 MG CAPS (BENZONATATE) Take 1 tablet by mouth three times a day as needed cough  #45 Capsule x 3   Entered and Authorized by:   Rubye Oaks NP   Signed by:   Rubye Oaks NP on 05/03/2010   Method used:   Electronically to        [facility] [phone]* (retail)       [address]       [place], [place]  [address]       Ph: [phone] or [phone]       Fax: [phone]   RxID:   [id number]

## 2010-06-24 NOTE — Assessment & Plan Note (Signed)
Summary: NP follow up - asthma   Primary Provider/Referring Provider:  Dr. Sharlot Gowda  CC:  6 month follow up .  History of Present Illness: 77/F with kyphoscoliosis & chronic productive cough since 2003.  This is productive of mostly clear phlegm and Mucinex helps somewhat.  PFTs in March 2004 have shown significant improvement in FEV1 with bronchodilator, hence maintained on inhaled  steroids.  She also has hiatal hernia with severe GERD symptoms and takes PPI twice a day with some breakthrough episodes of reflux. She is being treated as mild perssitent asthma , Spirometry 5/09 >> shows nml lung function. Did not tolerate advair. Off singulair - body aches, on claritin instead She describes an occasional episode of strangling in her throat, lasting a few minutes, that has been ongoing for many years, note negative ENT exam.  An ACE inhibitor was discontinued in April 2008.   Her dyspnea is felt to be multifactorial - kyphoscoliosis, cadriac issues & mild asthma + deconditioning Her chronic cough is also multifactorial - hiatal hernia/ GERD, mild asthma , sinus drainage.  11/09 >>at baseline, yellow mucus in am then clear for the rest of the day.Afraid to use albuterol MDI  Oct 10, 2008 --Presents for a 6 month follow up - states breathing is "fine" except still coughing with yellow sputum in the mornings that clears as day goes on. Had bad episode in 3/10 now resolved. not taking singulair anymore-this was stopped.  May 01, 2009 10:45 AM  uses wheelchair & walking cane. CEA in 8/10. c/ocough, uses mucinex tab/ syrup all the time.   November 06, 2009--Presents for 6 month follow up -  She has been doing fairly well . Does have intermittent cough in am that comes and goes. Mainly clear mucus. Wants refills. of meds. Needs 100mg  of tessalon-insurance will cover. Denies chest pain, orthopnea, hemoptysis, fever, n/v/d, edema, headache.   Medications Prior to Update: 1)  Cardizem Cd 120 Mg  Cp24 (Diltiazem Hcl Coated Beads) .... Take One Tablet By Mouth Once Daily. 2)  Plavix 75 Mg Tabs (Clopidogrel Bisulfate) .... Take One Tablet By Mouth Once Daily. 3)  Omeprazole 20 Mg  Tbec (Omeprazole) .Marland Kitchen.. 1 By Mouth Two Times A Day 4)  Crestor 40 Mg  Tabs (Rosuvastatin Calcium) .Marland Kitchen.. 1 By Mouth Daily 5)  Isosorbide Dinitrate 30 Mg  Tabs (Isosorbide Dinitrate) .Marland Kitchen.. 1 Tab By Mouth Two Times A Day 6)  Diovan 40 Mg  Tabs (Valsartan) .Marland Kitchen.. 1 By Mouth Daily 7)  Toprol Xl 25 Mg  Tb24 (Metoprolol Succinate) .Marland Kitchen.. 1 By Mouth Daily 8)  Claritin 10 Mg Tabs (Loratadine) .... As Needed 9)  Nasonex 50 Mcg/act Susp (Mometasone Furoate) .... 2 Sprays Each Nostril Daily 10)  Diazepam 2 Mg Tabs (Diazepam) .... As Needed 11)  Flovent Hfa 110 Mcg/act  Aero (Fluticasone Propionate  Hfa) .... Two  Puffs Twice Daily 12)  Mucinex Dm 30-600 Mg Xr12h-Tab (Dextromethorphan-Guaifenesin) .Marland Kitchen.. 1-2 Every 12 Hours As Needed 13)  Guaifenesin-Dm 100-10 Mg/44ml Syrp (Dextromethorphan-Guaifenesin) .... Per Bottle 14)  Ventolin Hfa 108 (90 Base) Mcg/act  Aers (Albuterol Sulfate) .Marland Kitchen.. 1-2 Puffs Every 4-6 Hours As Needed 15)  Nitrostat 0.4 Mg Subl (Nitroglycerin) .Marland Kitchen.. 1 Tablet Under Tongue At Onset of Chest Pain; You May Repeat Every 5 Minutes For Up To 3 Doses. 16)  Flovent Hfa 110 Mcg/act Aero (Fluticasone Propionate  Hfa) .... Daily  Current Medications (verified): 1)  Cardizem Cd 120 Mg Cp24 (Diltiazem Hcl Coated Beads) .... Take One Tablet By  Mouth Once Daily. 2)  Plavix 75 Mg Tabs (Clopidogrel Bisulfate) .... Take One Tablet By Mouth Once Daily. 3)  Omeprazole 20 Mg  Tbec (Omeprazole) .Marland Kitchen.. 1 By Mouth Two Times A Day 4)  Crestor 40 Mg  Tabs (Rosuvastatin Calcium) .Marland Kitchen.. 1 By Mouth Daily 5)  Isosorbide Dinitrate 30 Mg  Tabs (Isosorbide Dinitrate) .Marland Kitchen.. 1 Tab By Mouth Two Times A Day 6)  Diovan 40 Mg  Tabs (Valsartan) .Marland Kitchen.. 1 By Mouth Daily 7)  Toprol Xl 25 Mg  Tb24 (Metoprolol Succinate) .Marland Kitchen.. 1 By Mouth Daily 8)  Claritin 10 Mg  Tabs (Loratadine) .... As Needed 9)  Nasonex 50 Mcg/act Susp (Mometasone Furoate) .... 2 Sprays Each Nostril Daily 10)  Diazepam 2 Mg Tabs (Diazepam) .... As Needed 11)  Flovent Hfa 110 Mcg/act  Aero (Fluticasone Propionate  Hfa) .... Two  Puffs Twice Daily 12)  Mucinex Dm 30-600 Mg Xr12h-Tab (Dextromethorphan-Guaifenesin) .Marland Kitchen.. 1-2 Every 12 Hours As Needed 13)  Guaifenesin-Dm 100-10 Mg/48ml Syrp (Dextromethorphan-Guaifenesin) .... Per Bottle 14)  Ventolin Hfa 108 (90 Base) Mcg/act  Aers (Albuterol Sulfate) .Marland Kitchen.. 1-2 Puffs Every 4-6 Hours As Needed 15)  Nitrostat 0.4 Mg Subl (Nitroglycerin) .Marland Kitchen.. 1 Tablet Under Tongue At Onset of Chest Pain; You May Repeat Every 5 Minutes For Up To 3 Doses. 16)  Flovent Hfa 110 Mcg/act Aero (Fluticasone Propionate  Hfa) .... Daily 17)  Benzonatate 100 Mg Caps (Benzonatate) .... Take 1 Tablet By Mouth Three Times A Day As Needed Cough  Allergies (verified): 1)  ! Asa 2)  ! * Amitriptylline 3)  ! Elavil  Past History:  Past Medical History: Last updated: 12/16/2008 DYSPNEA (ICD-786.05) ASTHMA, PERSISTENT, MILD (ICD-493.90) COUGH (ICD-786.2) CAD s/p CABG   --s/p lateral MI Carotid stenosis Large hiatal hernia  Past Surgical History: Last updated: 05/01/2009  Kerin Perna, M.D. 02/04/2005  OPERATION:  1.  Coronary artery bypass grafting x4 (left internal mammary artery to      diagonal, saphenous vein graft to left anterior descending, saphenous      vein graft to obtuse marginal, saphenous vein graft to posterior      descending).  2.  Modified maze procedure.  3.  Placement of intra-aortic balloon pump.   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  Risk Factors: Smoking Status: never (10/10/2008)  Family History: heart disease - mother rheumatism - mother cancer - brother (melanoma) DM - mother stroke - mat aunt  emphysema - brother alzheimer's - mat aunt  Social History: never smoked no alcohol quit dipping snuff 06/2001, x70years 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 No Comments Medications reviewed with patient. Daytime contact number verified with patient. Boone Master CNA/MA  November 06, 2009 2:36 PM    Physical Exam  Additional Exam:  GEN: A/Ox3; pleasant , NAD HEENT:  [place]/AT, , EACs-clear, TMs-wnl, NOSE-clear, THROAT-clear NECK:  Supple w/ fair ROM; no JVD; normal carotid impulses w/o bruits; no thyromegaly or nodules palpated; no lymphadenopathy. RESP  Clear to P & A; w/o, wheezes/ rales/ or rhonchi, decreased in bases CARD:  RRR, no m/r/g   [place]co: Warm bil,  no calf tenderness edema, clubbing, pulses intact    Impression & Recommendations:  Problem # 1:  ASTHMA, PERSISTENT, MILD (ICD-493.90) Controlled on meds  follow up Dr. Vassie Loll in 6 months   Medications Added to Medication List This Visit: 1)  Flovent Hfa 110 Mcg/act Aero (Fluticasone propionate  hfa) .... Two  puffs twice daily 2)  Ventolin Hfa 108 (90 Base) Mcg/act Aers (Albuterol sulfate) .Marland Kitchen.. 1-2 puffs every 4-6 hours as needed 3)  Benzonatate 100 Mg Caps (Benzonatate) .... Take 1 tablet by mouth three times a day as needed cough  Complete Medication List: 1)  Cardizem  Cd 120 Mg Cp24 (Diltiazem hcl coated beads) .... Take one tablet by mouth once daily. 2)  Plavix 75 Mg Tabs (Clopidogrel bisulfate) .... Take one tablet by mouth once daily. 3)  Omeprazole 20 Mg Tbec (Omeprazole) .Marland Kitchen.. 1 by mouth two times a day 4)  Crestor 40 Mg Tabs (Rosuvastatin calcium) .Marland Kitchen.. 1 by mouth daily 5)  Isosorbide Dinitrate 30 Mg Tabs (Isosorbide dinitrate) .Marland Kitchen.. 1 tab by mouth two times a day 6)  Diovan 40 Mg Tabs (Valsartan) .Marland Kitchen.. 1 by mouth daily 7)  Toprol Xl 25 Mg Tb24 (Metoprolol succinate) .Marland Kitchen.. 1 by mouth daily 8)  Claritin 10 Mg Tabs (Loratadine) .... As needed 9)  Nasonex 50 Mcg/act Susp (Mometasone furoate) .... 2 sprays each nostril daily 10)  Diazepam 2 Mg Tabs (Diazepam) .... As needed 11)  Flovent Hfa 110 Mcg/act Aero (Fluticasone propionate  hfa) .... Two  puffs twice daily 12)  Mucinex Dm 30-600 Mg Xr12h-tab (Dextromethorphan-guaifenesin) .Marland Kitchen.. 1-2 every 12 hours as needed 13)  Guaifenesin-dm 100-10 Mg/4ml Syrp (Dextromethorphan-guaifenesin) .... Per bottle 14)  Ventolin Hfa 108 (90 Base) Mcg/act Aers (Albuterol sulfate) .Marland Kitchen.. 1-2 puffs every 4-6 hours as needed 15)  Nitrostat 0.4 Mg Subl (Nitroglycerin) .Marland Kitchen.. 1 tablet under tongue at onset of chest pain; you may repeat every 5 minutes for up to 3 doses. 16)  Flovent Hfa 110 Mcg/act Aero (Fluticasone propionate  hfa) .... Daily 17)  Benzonatate 100 Mg Caps (Benzonatate) .... Take 1 tablet by mouth three times a day as needed cough  Patient Instructions: 1)  Continue on same meds.  2)  follow up Dr. Vassie Loll in 6 months  Prescriptions: BENZONATATE 100 MG CAPS (BENZONATATE) Take 1 tablet by mouth three times a day as needed cough  #45 x 2   Entered and Authorized by:   Rubye Oaks NP   Signed by:   Rubye Oaks NP on 11/06/2009   Method used:   Electronically to        [facility]. [phone]* (retail)       [address]       [place], [place]  [address]       Ph: [phone] or  [phone]  Fax: [phone]   RxID:   [id number] VENTOLIN HFA 108 (90 BASE) MCG/ACT  AERS (ALBUTEROL SULFATE) 1-2 puffs every 4-6 hours as needed  #1 x 2   Entered and Authorized by:   Rubye Oaks NP   Signed by:   Rubye Oaks NP on 11/06/2009   Method used:   Electronically to        [facility]. [phone]* (retail)       [address]       [place], [place]  [address]       Ph: [phone] or [phone]       Fax: [phone]   RxID:   [id number] FLOVENT HFA 110 MCG/ACT  AERO (FLUTICASONE PROPIONATE  HFA) Two  puffs twice daily  #1 x 5   Entered and Authorized by:   Rubye Oaks NP   Signed by:   Rubye Oaks NP on 11/06/2009   Method used:   Electronically to        [facility]. [phone]* (retail)       [address]       [place], [place]  [address]       Ph: [phone] or [phone]       Fax: [phone]   RxID:   [id number]     Appended Document: Orders Update     Clinical Lists Changes  Orders: Added new Service order of Est. Patient Level II ([id number]) - Signed

## 2010-07-01 ENCOUNTER — Other Ambulatory Visit (HOSPITAL_COMMUNITY): Payer: Self-pay | Admitting: Gastroenterology

## 2010-07-05 ENCOUNTER — Ambulatory Visit (HOSPITAL_COMMUNITY)
Admission: RE | Admit: 2010-07-05 | Discharge: 2010-07-05 | Disposition: A | Discharge status: Home / Self Care | Payer: Medicare Other | Source: Ambulatory Visit | Attending: Gastroenterology | Admitting: Gastroenterology

## 2010-07-05 DIAGNOSIS — K228 Other specified diseases of esophagus: Secondary | ICD-10-CM | POA: Insufficient documentation

## 2010-07-05 DIAGNOSIS — K2289 Other specified disease of esophagus: Secondary | ICD-10-CM | POA: Insufficient documentation

## 2010-07-05 DIAGNOSIS — K449 Diaphragmatic hernia without obstruction or gangrene: Secondary | ICD-10-CM | POA: Insufficient documentation

## 2010-07-05 DIAGNOSIS — R1310 Dysphagia, unspecified: Secondary | ICD-10-CM | POA: Insufficient documentation

## 2010-07-14 NOTE — Letter (Signed)
Summary: Dental Request for Medical Clearance   Dental Request for Medical Clearance   Imported By: Roderic Ovens 07/09/2010 14:54:17  _____________________________________________________________________  External Attachment:    Type:   Image     Comment:   External Document

## 2010-08-05 ENCOUNTER — Ambulatory Visit (INDEPENDENT_AMBULATORY_CARE_PROVIDER_SITE_OTHER): Payer: Medicare Other | Admitting: Family Medicine

## 2010-08-05 DIAGNOSIS — K219 Gastro-esophageal reflux disease without esophagitis: Secondary | ICD-10-CM

## 2010-08-26 ENCOUNTER — Other Ambulatory Visit: Payer: Self-pay | Admitting: Adult Health

## 2010-08-28 LAB — COMPREHENSIVE METABOLIC PANEL
ALT: 27 U/L (ref 0–35)
AST: 25 U/L (ref 0–37)
CO2: 30 mEq/L (ref 19–32)
Calcium: 9.6 mg/dL (ref 8.4–10.5)
Creatinine, Ser: 0.5 mg/dL (ref 0.4–1.2)
GFR calc Af Amer: >60 mL/min (ref >60)
GFR calc non Af Amer: >60 mL/min (ref >60)
Sodium: 141 mEq/L (ref 135–145)
Total Protein: 6.8 g/dL (ref 6.0–8.3)

## 2010-08-28 LAB — CBC
HCT: 30.9 % — ABNORMAL LOW (ref 36.0–46.0)
MCHC: 34.1 g/dL (ref 30.0–36.0)
MCV: 91.5 fL (ref 78.0–100.0)
MCV: 92 fL (ref 78.0–100.0)
Platelets: 190 10*3/uL (ref 150–400)
Platelets: 235 10*3/uL (ref 150–400)
RBC: 3.95 MIL/uL (ref 3.87–5.11)
RDW: 13.7 % (ref 11.5–15.5)
RDW: 13.9 % (ref 11.5–15.5)
WBC: 8.1 10*3/uL (ref 4.0–10.5)

## 2010-08-28 LAB — URINALYSIS, ROUTINE W REFLEX MICROSCOPIC
Nitrite: NEGATIVE
Protein, ur: NEGATIVE mg/dL
Specific Gravity, Urine: 1.01 (ref 1.005–1.030)
Urobilinogen, UA: 0.2 mg/dL (ref 0.0–1.0)

## 2010-08-28 LAB — CROSSMATCH
ABO/RH(D): A POS
Antibody Screen: NEGATIVE

## 2010-08-28 LAB — PROTIME-INR: INR: 0.9 (ref 0.00–1.49)

## 2010-08-28 LAB — BASIC METABOLIC PANEL
BUN: 7 mg/dL (ref 6–23)
Chloride: 103 mEq/L (ref 96–112)
Creatinine, Ser: 0.49 mg/dL (ref 0.4–1.2)
Glucose, Bld: 115 mg/dL — ABNORMAL HIGH (ref 70–99)

## 2010-08-28 LAB — APTT: aPTT: 33 seconds (ref 24–37)

## 2010-10-05 ENCOUNTER — Ambulatory Visit (INDEPENDENT_AMBULATORY_CARE_PROVIDER_SITE_OTHER): Payer: Medicare Other | Admitting: Family Medicine

## 2010-10-05 ENCOUNTER — Encounter: Payer: Self-pay | Admitting: Family Medicine

## 2010-10-05 VITALS — BP 116/58 | HR 64 | Wt 144.0 lb

## 2010-10-05 DIAGNOSIS — J309 Allergic rhinitis, unspecified: Secondary | ICD-10-CM | POA: Insufficient documentation

## 2010-10-05 DIAGNOSIS — E785 Hyperlipidemia, unspecified: Secondary | ICD-10-CM | POA: Insufficient documentation

## 2010-10-05 DIAGNOSIS — I6350 Cerebral infarction due to unspecified occlusion or stenosis of unspecified cerebral artery: Secondary | ICD-10-CM

## 2010-10-05 DIAGNOSIS — Z79899 Other long term (current) drug therapy: Secondary | ICD-10-CM

## 2010-10-05 DIAGNOSIS — I639 Cerebral infarction, unspecified: Secondary | ICD-10-CM

## 2010-10-05 DIAGNOSIS — J45909 Unspecified asthma, uncomplicated: Secondary | ICD-10-CM

## 2010-10-05 LAB — COMPREHENSIVE METABOLIC PANEL
ALT: 21 U/L (ref 0–35)
AST: 20 U/L (ref 0–37)
Albumin: 4.6 g/dL (ref 3.5–5.2)
Alkaline Phosphatase: 49 U/L (ref 39–117)
BUN: 18 mg/dL (ref 6–23)
Calcium: 9.6 mg/dL (ref 8.4–10.5)
Chloride: 105 mEq/L (ref 96–112)
Potassium: 3.9 mEq/L (ref 3.5–5.3)
Sodium: 142 mEq/L (ref 135–145)
Total Protein: 6.9 g/dL (ref 6.0–8.3)

## 2010-10-05 LAB — LIPID PANEL
HDL: 52 mg/dL (ref >39)
LDL Cholesterol: 69 mg/dL (ref 0–99)

## 2010-10-05 MED ORDER — FLUTICASONE PROPIONATE HFA 220 MCG/ACT IN AERO: 1.0000 | INHALATION_SPRAY | Freq: Two times a day (BID) | RESPIRATORY_TRACT | Status: DC

## 2010-10-05 NOTE — H&P (Signed)
HISTORY AND PHYSICAL EXAMINATION   December 09, 2008   Re:  [name], [name]                 DOB:  Sep 21, 1928   CHIEF COMPLAINT:  Severe right internal carotid stenosis - asymptomatic.   HISTORY OF PRESENT ILLNESS:  This 77 year old female patient has been  known to have carotid occlusive disease since undergoing coronary artery  bypass grafting in 2006.  Repeat carotid duplex exam at this time  revealed a 90% right internal carotid stenosis and a 50% left internal  carotid stenosis.  She was referred for further evaluation.  She denies  any history of stroke, hemispheric TIAs, amaurosis fugax, diplopia,  blurred vision or syncope.  She is now scheduled for a right carotid  endarterectomy following cardiac clearance.   PAST MEDICAL HISTORY:  1. Hypertension.  2. Hyperlipidemia.  3. Coronary artery disease with previous myocardial infarction in 2003      and coronary artery bypass grafting.  4. COPD with asthma.  5. Negative for diabetes or stroke.   PAST SURGICAL HISTORY:  1. Coronary artery bypass grafting with Cox-maze procedure by Dr. Donata Clay in 2006.  2. Hysterectomy.   FAMILY HISTORY:  Positive for coronary artery disease, diabetes and  stroke in her mother.   SOCIAL HISTORY:  She is widowed.  Does not use tobacco for 50 years.  Does not use alcohol.   REVIEW OF SYSTEMS:  Positive for dyspnea on exertion, productive cough  with occasional wheezing.  History of hiatal hernia with reflux,  esophagitis, chronic constipation, urinary frequency, occasional  dizziness without syncope, arthritis and joint pain.   ALLERGIES:  Aspirin and amitriptyline.   MEDICATIONS:  1. Claritin 10 mg one a day.  2. Diltiazem 120 mg one a day.  3. Crestor 40 mg one a day.  4. Diovan 40 mg one a day.  5. Metoprolol 25 mg one a day.  6. Plavix 75 mg one a day.  7. Omeprazole 20 mg b.i.d.  8. Isosorbide 30 half tablet twice a day.  9. Nasonex nasal spray.  10.Flovent HFA 120 mcg inhaler.  11.Diazepam 2 mg p.r.n.  12.Nitroglycerin sublingual p.r.n.  13.Robitussin and guaifenesin p.r.n.  14.Mucinex tablets p.r.n.   PHYSICAL EXAMINATION:  Vital signs:  Blood pressure 108/60, heart rate  70, respirations 14.  General:  She is an elderly female in no apparent  distress, alert and oriented x3.  Neck:  Supple, 3+ carotid pulses  palpable.  Harsh bruit on the right, soft bruit on the left.  Neurologic:  Normal.  Neck:  No palpable adenopathy in the neck.  Chest:  Clear to auscultation.  Cardiovascular:  Regular rhythm.  No murmurs.  Abdomen:  Soft, nontender with no masses.  She has 3+ femoral pulses  bilaterally with well-perfused lower extremities.   Carotid duplex exam was performed at [facility] and repeated at  the [facility] office today for confirmation and the right internal carotid  appears to have at least 90% stenosis with approximate 50% stenosis on  the left side.   PLAN:  To admit the patient on August 11 for elective right carotid  endarterectomy.  Risks and benefits have been thoroughly discussed with  the patient.  The patient is to see Dr. Gala Romney for cardiac clearance  on July 27.   Quita Skye. Hart Rochester, M.D.  Electronically Signed   JDL/MEDQ  D:  12/09/2008  T:  12/10/2008  Job:  [id number]   cc:   [name], MD  Sharlot Gowda, M.D.

## 2010-10-05 NOTE — Op Note (Signed)
NAMECHANDLER, [name]                 ACCOUNT NO.:  [id number]   MEDICAL RECORD NO.:  [id number]          PATIENT TYPE:  INP   LOCATION:  3314                         FACILITY:  [facility]   PHYSICIAN:  Quita Skye. Hart Rochester, M.D.  DATE OF BIRTH:  1929-03-28   DATE OF PROCEDURE:  12/31/2008  DATE OF DISCHARGE:                               OPERATIVE REPORT   PREOPERATIVE DIAGNOSIS:  Severe right internal carotid stenosis -  asymptomatic.   POSTOPERATIVE DIAGNOSIS:  Severe right internal carotid stenosis -  asymptomatic.   OPERATION:  1. Right carotid endarterectomy with Dacron patch angioplasties  2. Resection of redundant carotid artery with primary reanastomosis of      common carotid artery.   SURGEON:  Quita Skye. Hart Rochester, MD   FIRST ASSISTANT:  Jerold Coombe, PA   ANESTHESIA:  General endotracheal.   BRIEF HISTORY:  This patient was found to have carotid occlusive disease  which has been followed by duplex scanning and is now progressed to 90%  severity on the right side with mild disease on the left side.  She is  scheduled for right carotid endarterectomy.   PROCEDURE:  The patient was taken to the operating room and placed in  the supine position at which time satisfactory general endotracheal  anesthesia was administered.  The right neck was prepped with Betadine  scrub and solution, draped in a routine sterile manner.  Incision was  made along the anterior border of the sternocleidomastoid muscle and  carried down through the subcutaneous tissue and platysma using Bovie.  The common facial vein and external jugular veins were ligated with 3-0  silk ties and divided exposing the common internal and external carotid  arteries.  Care was taken not to injure the vagus or hypoglossal nerves  both which were exposed.  There was redundancy and mild kinking of the  internal carotid artery above the bifurcation which was significant and  it was clear that resection would be necessary  to straighten this vessel  and avoid a postoperative kink.  A #10 shunt was prepared and the  patient was heparinized.  The carotid vessels were occluded with  vascular clamps.  Longitudinal opening was made in the common carotid  with 15 blade and extended up the internal carotid with Potts scissors  to a point distal to the disease.  Plaque was about 90% stenotic in  severity with excellent backbleeding coming from distal vessel.  A #10  shunt was inserted without difficulty reestablishing flow in about 2  minutes.  Standard endarterectomy was then performed using elevator and  Potts scissors with eversion endarterectomy of the external carotid.  Plaque feathered off distal internal carotid artery nicely not requiring  any tacking sutures.  The lumen was thoroughly irrigated with heparin  saline.  All loose debris was carefully removed.  Marking sutures of 6-0  Prolene were then placed to resect about a 1.5-2 cm segment of the  common carotid to straighten the artery.  After this was resected,  primary reanastomosis was done with 6-0 Prolene.  Arteriotomy was then  closed with  a patch.  Prior to completion of closure, shunt was removed  and following antegrade and retrograde flushing, closure was completed  reestablishing flow initially up the external and internal branch.  Carotid was occluded for less than 2 minutes for  removal of shunt.  Protamine was then given to reverse the heparin.  Following adequate hemostasis, wound was irrigated with saline and  closed in layers with Vicryl in a subcuticular fashion.  Sterile  dressing was applied.  The patient was taken to the recovery room in  satisfactory condition.      Quita Skye Hart Rochester, M.D.  Electronically Signed     JDL/MEDQ  D:  12/31/2008  T:  12/31/2008  Job:  [id number]

## 2010-10-05 NOTE — Procedures (Signed)
CAROTID DUPLEX EXAM   INDICATION:  Followup known carotid artery disease.   HISTORY:  Diabetes:  No.  Cardiac:  CABG.  Hypertension:  Yes.  Smoking:  Quit about 15 years ago.  Previous Surgery:  No.  CV History:  No.  Amaurosis Fugax No, Paresthesias No, Hemiparesis No                                       RIGHT             LEFT  Brachial systolic pressure:  Brachial Doppler waveforms:  Vertebral direction of flow:        Antegrade  DUPLEX VELOCITIES (cm/sec)  CCA peak systolic                   101  ECA peak systolic                   196  ICA peak systolic                   432  ICA end diastolic                   129  PLAQUE MORPHOLOGY:                  Calcified  PLAQUE AMOUNT:                      Severe  PLAQUE LOCATION:                    ICA and ECA   IMPRESSION:  1. Limited study.  2. 80-99% stenosis noted in the right ICA.  3. Antegrade right vertebral arteries.   ___________________________________________  Quita Skye Hart Rochester, M.D.   MG/MEDQ  D:  12/09/2008  T:  12/10/2008  Job:  [id number]

## 2010-10-05 NOTE — Discharge Summary (Signed)
[name], [name]                 ACCOUNT NO.:  [id number]   MEDICAL RECORD NO.:  [id number]          PATIENT TYPE:  INP   LOCATION:  3314                         FACILITY:  [facility]   PHYSICIAN:  Quita Skye. Hart Rochester, M.D.  DATE OF BIRTH:  [birth year]   DATE OF ADMISSION:  12/31/2008  DATE OF DISCHARGE:  01/01/2009                               DISCHARGE SUMMARY   ADMISSION DIAGNOSIS:  Severe right internal carotid artery stenosis,  asymptomatic.   FINAL DISCHARGE DIAGNOSES:  1. Severe right internal carotid artery stenosis, asymptomatic status      post right carotid endarterectomy.  2. Postoperative hypokalemia, supplemented.  3. Hypertension.  4. Hyperlipidemia.  5. Coronary artery disease with history of coronary artery bypass      grafting with [name] by Dr. Kathlee Nations Trigt, in 2006      myocardial infarction.  6. Chronic obstructive pulmonary disease with asthma.  7. History of hysterectomy.  8. Remote tobacco use.   ALLERGIES:  ASPIRIN, AMITRIPTYLINE, and SIMVASTATIN.   PROCEDURES:  December 31, 2008:  Right carotid endarterectomy with Dacron  patch angioplasty and resection of redundant carotid artery with primary  reanastomosis of the common carotid artery by Dr. Josephina Gip.   BRIEF HISTORY:  [name] is a 77 year old Caucasian female who was  found to have carotid occlusive disease, which has been followed by  duplex scanning, is now progressed to 90% in severity on the right side  with mild disease on the left.  Dr. Hart Rochester recommended right carotid  endarterectomy to reduce her risk for future stroke.   HOSPITAL COURSE:  [name] was electively admitted to [facility] on December 31, 2008.  She underwent the previously mentioned  [name].  Postoperatively, she was extubated, neurologically intact,  and had a short stay in recovery unit, was transferred to step-down unit  3300, where it was anticipated she will remain until discharge.  At the  time of this dictation, she has had an uneventful postoperative course.  She was supplemented per standing orders for postoperative hypokalemia  with a potassium of 3.2.  Otherwise, her labs were stable showing a  white count of 8.1, hemoglobin 10.6, hematocrit 30.9, platelet count of  190.  Sodium 138, BUN of 7, creatinine 0.49, and blood glucose of 115.  She has overall been afebrile with stable blood pressure.  She has been  maintaining sinus rhythm in 60s and 70s. She has been weaning from  supplemental oxygen.   On the morning of postop day #1, she remained neurologically intact.  Her incision clean and dry without evidence of hematoma or infection.  Morning, her diet was advanced.  She began to mobilize.  We anticipate  that if she is voiding and ambulating without difficulty and tolerating  regular food that she will be ready for discharge to home on postop day  #1, January 01, 2009.  She currently denies dysphagia and pain has been  controlled.  She is in stable and improving condition.   DISCHARGE MEDICATIONS:  1. Plavix 75 mg p.o. daily.  2. Claritin  10 mg p.o. daily.  3. Diltiazem 120 mg daily.  4. Crestor 40 mg p.o. daily.  5. Diovan 40 mg daily.  6. Metoprolol 25 mg daily.  7. Omeprazole 20 mg b.i.d.  8. Isosorbide mononitrate 30 mg one-half tablet b.i.d.  9. Nasonex spray at bedtime.  10.Diazepam 2 mg p.r.n. per home regimen.  11.Nitroglycerin p.r.n. per home regimen.  12.Robitussin p.r.n. per home regimen.  13.Mucinex p.r.n. per home regimen.  14.Percocet 5/325 mg 1-2 tablets p.o. q.4 h p.r.n. pain.   DISCHARGE INSTRUCTIONS:  Continue heart-healthy diet.  Shower and clean  incisions gently with soap and water.  Call if she has fever greater  than 101, redness, or prolonged drainage from her incision site, severe  headache or new neurologic changes.  She should avoid driving or heavy  lifting for the next couple of weeks.  Dr. Hart Rochester will see her in 2-3  weeks.  She  should call sooner if needed.      Jerold Coombe, P.A.      Quita Skye Hart Rochester, M.D.  Electronically Signed    AWZ/MEDQ  D:  01/01/2009  T:  01/01/2009  Job:  [id number]   cc:   [name], MD  Kerin Perna, M.D.  Sharlot Gowda, M.D.

## 2010-10-05 NOTE — Patient Instructions (Signed)
We will call you with an appointment to see the neurologist. Use the new Flovent medication and if you need albuterol more than once or twice per week, call me.

## 2010-10-05 NOTE — Assessment & Plan Note (Signed)
[facility]                            CARDIOLOGY OFFICE NOTE   [name], [name]                        MRN:          [id number]  DATE:01/25/2008                            DOB:          09-Nov-1928    PRIMARY CARE PHYSICIAN:  Lori Gowda, MD   PULMONOLOGIST:  Lori Locket. Vassie Loll, MD   INTERVAL HISTORY:  [name] is a 77 year old woman with multiple  medical problems including coronary artery disease, status post bypass  surgery in September 2006 as well as a Cox-Maze procedure for atrial  fibrillation.  She also has carotid artery stenosis as well as  fibromyalgia severe kyphoscoliosis with a huge hiatal hernia and  gastroesophageal reflux disease.   She returns today for routine followup.  She continues to have chronic  chest wall soreness from her bypass surgery.  She also says about once a  week she has some chest pain over the left side of her chest.  She takes  nitroglycerin and it resolves.  There is no change in the pattern.  She  denies any dyspnea with it.  It is not exertional or reproducible.  It  can just happen at any time.  She also has continued problems with  chronic shortness of breath and cough.  She was recently found to be  anemic with hemoglobin of 7.  She received 2 units of blood and she had  an EGD and a colonoscopy with [name] that showed polyps, but no  obvious source of bleeding.  She is pending a capsule endoscopy.   CURRENT MEDICATIONS:  1. Asmanex.  2. Toprol-XL 25.  3. Cartia XT 120 a day.  4. Plavix 75.  5. Nasonex.  6. Singulair 10 a day.  7. Diovan 40 a day.  8. Crestor 40 a day./  9. Omeprazole 20 b.i.d.  10.Imdur 15 b.i.d.   PHYSICAL EXAM:  GENERAL:  An elderly woman sitting in wheelchair in no  acute distress.  VITAL SIGNS:  Blood pressure is 119/63, heart rate 60, weighs 141.  HEENT:  Normal.  NECK:  Supple.  No JVD.  Carotids are 2+ bilaterally with soft bruit on  the left.  CARDIAC:  PMI is  nondisplaced.  Regular rate and rhythm.  No murmurs,  rubs, or gallops.  LUNGS:  Clear.  ABDOMEN:  Soft, nontender, and nondistended.  No obvious  hepatosplenomegaly.  No bruits.  No masses.  Good bowel sounds.  EXTREMITIES:  Warm with no cyanosis, clubbing, or edema.  No rash.  NEURO:  Alert and oriented x3.  Cranial nerves II through XII are  intact.  Moves all 4 extremities without difficulty.  Affect is  pleasant.   EKG shows sinus arrhythmia at a rate of 60, no ST-T wave abnormalities.   ASSESSMENT AND PLAN:  1. Coronary artery disease.  She continues to have occasional chest      pain this has both typical and atypical features.  There has been      no progression.  We have discussed stress testing and cardiac      catheterization and  she is not interested in either at this time.      Continue current therapy.  2. Dyspnea.  This is stable followed by pulmonary.  3. Carotid artery stenosis.  She has 60% to 79% blockage in the right      and 40% to 59% blockage in the left.  She is due for repeat      ultrasound.  4. Hyperlipidemia is followed by Dr. Susann [name] and goal LDL is less than      70.  Continue Crestor.   DISPOSITION:  We will see her back in 9 months for routine followup.     [name], MD  Electronically Signed    DRB/MedQ  DD: 01/25/2008  DT: 01/26/2008  Job #: [id number]   cc:   [name], M.D.  Lori Milch, MD

## 2010-10-05 NOTE — Assessment & Plan Note (Signed)
OFFICE VISIT   Lori, [name]  DOB:  12-Oct-1928                                       09/22/2009  CHART#:[id number]   The patient returns for continued followup regarding her right carotid  endarterectomy which I performed in August of last year for an  asymptomatic severe right internal carotid stenosis.  She had an  endarterectomy performed as well as resection of redundant carotid  artery with primary reanastomosis.  She has done well from a neurologic  standpoint.  She had a followup carotid duplex exam 2 months ago which  revealed no evidence of restenosis, widely patent carotid arteries.  She  has been having some neck discomfort on the right side over the last 4  or 5 months which she is concerned about.  Occasionally she states her  throat is sore and occasionally she states her swallowing is sluggish.  She denies any hemiparesis, aphasia, amaurosis fugax, diplopia, blurred  vision or syncope or other neurologic symptoms.   CHRONIC MEDICAL PROBLEMS:  1. Hypertension.  2. Hyperlipidemia.  3. Coronary artery disease.  4. Chronic bronchitis.   SOCIAL HISTORY:  She is widowed, has three children, is a retired  [employer] trader, quit smoking in 1960.   REVIEW OF SYSTEMS:  Positive for occasional dyspnea on exertion,  productive cough, chronic bronchitis, asthma, reflux esophagitis,  dysphagia.   PHYSICAL EXAMINATION:  Vital signs:  Blood pressure 132/67, heart rate  71, respirations 14.  General:  She is a well-developed, well-nourished  female in no apparent distress, alert and oriented x3.  HEENT:  Exam is  normal.  EOMs intact.  Neck:  Her neck is supple with 3+ carotid pulses  palpable.  No bruits audible.  Right neck incision is well-healed.  There is no palpable adenopathy in the neck.  No masses palpable.  No  asymmetry is noted on inspection.  Lungs:  The lungs are clear to  auscultation with no wheezing.  Cardiovascular:  Regular rhythm, no  murmurs.  Abdomen:  Soft, nontender with no masses.   I do not think that her symptoms are due to her carotid surgery and she  appears to have no evidence of restenosis or any symptoms.  She will  return in another 4 or 5 months for continued followup of her carotid  artery on duplex scanning and she will be seeing Dr. Vassie Loll soon, he  manages her pulmonary problems, who can further evaluate this neck  discomfort.  I discussed this at length with her and her daughter.     [name], M.D.  Electronically Signed   JDL/MEDQ  D:  09/22/2009  T:  09/23/2009  Job:  [id number]   cc:   [name], M.D.  Lori Milch, MD

## 2010-10-05 NOTE — Procedures (Signed)
CAROTID DUPLEX EXAM   INDICATION:  Followup of carotid disease.   HISTORY:  Diabetes:  no  Cardiac:  CABG  Hypertension:  yes  Smoking:  Previous  Previous Surgery:  Right carotid endarterectomy 12/31/2008  CV History:  Currently Asymptomatic  Amaurosis Fugax  , Paresthesias  , Hemiparesis                                       RIGHT             LEFT  Brachial systolic pressure:         120               122  Brachial Doppler waveforms:         triphasic         triphasic  Vertebral direction of flow:        Antegrade         Antegrade  DUPLEX VELOCITIES (cm/sec)  CCA peak systolic                   78                95  ECA peak systolic                   95                90  ICA peak systolic                   135               165 (BIF)  ICA end diastolic                   45                46  PLAQUE MORPHOLOGY:                  mixed             mixed  PLAQUE AMOUNT:                      mild              mild  PLAQUE LOCATION:                    CCA               Bifurcation, ICA,  CCA   IMPRESSION:  1. 40% to 59% stenosis noted in right internal carotid artery.  2. 40% to 59% stenosis noted in left bifurcation.   ___________________________________________  Quita Skye Hart [place], M.D.   CJ/MEDQ  D:  07/17/2009  T:  07/17/2009  Job:  [id number]

## 2010-10-05 NOTE — Assessment & Plan Note (Signed)
[facility]                             PULMONARY OFFICE NOTE   NAME:[name], [name]                        MRN:          [id number]  DATE:04/13/2007                            DOB:          [birth year]    Ms. Megill is a pleasant 77 year old Caucasian woman who returns for a  followup of her chronic productive cough.  This has been ongoing since  2003 when she had a cardiac procedure done.  This is productive of  mostly clear phlegm and Mucinex helps somewhat.  PFTs in March 2004 have  shown significant improvement in FEV1 with bronchodilator, hence she has  been maintained on a regimen of Singulair and Asmanex which does seem to  help.  She also has hiatal hernia with severe GERD symptoms and takes  Protonix twice a day and still has some breakthrough episodes of reflux.  She describes an occasional episode of strangling in her throat, lasting  a few minutes, that has been ongoing for many years and I wonder if this  is related to her reflux.  And ACE inhibitor was discontinued in April  2008.  A striking part of her physical exam is kyphoscoliosis which is  pronounced when she stands up.   She describes a bad episode of bronchitis which required antibiotics 2-3  weeks ago and an EMS squad had to be called to her place, she is much  better now.   PAST MEDICAL HISTORY:  1. Atrial fibrillation, status post Maze procedure.  2. Right carotid stenosis.  3. Fibromyalgia.  4. CABG in 2006.  5. Paroxysmal atrial fibrillation.  6. Hyperlipidemia.   CURRENT MEDICATIONS:  1. Asmanex 2 puffs daily.  2. Singulair 10 mg daily.  3. Toprol XL 25 mg daily.  4. Diltiazem ER 120 mg daily.  5. Nasonex 1 spray in each naris nightly.  6. Plavix 75 mg daily.  7. Protonix 40 mg b.i.d.  8. Singulair 10 mg daily.  9. Diovan 40 mg daily.  10.Diazepam 0.2 mg p.r.n.  11.NitroQuick 0.4 mg p.r.n.  12.Mucinex p.r.n.  13.Robitussin p.r.n.   PHYSICAL EXAMINATION:   Weight 150 pounds, temperature 98.2, blood  pressure 126/72, heart rate 69, oxygen saturation 97% on room air.  HEENT:  Normal.  CHEST:  Kyphoscoliotic deformity, decreased breath sounds both bases, no  rhonchi, no crackles.  ABDOMEN:  Soft, nontender.  EXTREMITIES:  With 1+ edema.   Last chest x-ray from December 2007 showed thoracolumbar scoliosis, no  infiltrates.   IMPRESSION:  1. Mild persistent asthma.  2. Hiatal hernia with gastroesophageal reflux disease.  3. Severe kyphoscoliosis.  4. Chronic cough.   RECOMMENDATIONS:  1. She will continue Asmanex 2 puffs nightly 220 mcg.  2. Ventolin HFA sample was provided and MDI technique was      demonstrated.  She will use this for breakthrough episodes of      dyspnea and/or wheezing.  3. She will continue her anti-reflux regimen.  4. Mucinex will be used on a p.r.n. basis.     Oretha Milch, MD  Electronically Signed  RVA/MedQ  DD: 04/13/2007  DT: 04/14/2007  Job #: [id number]   cc:   Sharlot Gowda, M.D.

## 2010-10-05 NOTE — Assessment & Plan Note (Signed)
OFFICE VISIT   [name], [name]  DOB:  Oct 26, 1928                                       01/20/2009  CHART#:[id number]   The patient is 3 weeks post right carotid endarterectomy with Dacron  patch angioplasty for a severe but asymptomatic right internal carotid  stenosis.  This required resection of her carotid with primary  reanastomosis because of redundancy.  She has done very well with no  neurologic complications or specific lateralizing complaints.  She is  taking Plavix.  She did have one episode 8 days postop where she became  confused and could not answer questions appropriately for a short period  of time with her daughter but that cleared.  She has had no lateralizing  signs or other symptoms.   PHYSICAL EXAMINATION:  Blood pressure 130/69, heart rate 71,  respirations 14.  Carotid pulses are 3+ with no bruits.  Right neck  incision is well-healed.  Neurologic exam is normal.   I am pleased with her early result and plan to see her in 6 months with  a followup carotid duplex exam at that time unless she develops any  specific neurologic symptoms in the interim.   Quita Skye Hart [place], M.D.  Electronically Signed   JDL/MEDQ  D:  01/20/2009  T:  01/21/2009  Job:  [id number]

## 2010-10-05 NOTE — Progress Notes (Signed)
  Subjective:    Patient ID: [name], female    DOB: [birth year], 77 y.o.   MRN: [id number]  HPI he is here for consultation strain continued difficulty with swallowing. She has been seen by ENT. She was placed on a PPI for this which she says does help minimally. She was also recently seen by Dr. Jeral Fruit. His note was reviewed and he recommended neurology referral for possible CVA. Due to record indicates she's also been seen by GI as well as pulmonary. He continues on medications listed in the chart. She is using albuterol on a daily basis.    Review of Systems    not done. The patient usually has multiple nonspecific complaints Objective:   Physical Exam alert and in no distress otherwise not examined        Assessment & Plan:  Dysphasia. GERD. Hypertension. ASHD. Asthma. Allergic rhinitis. I will increase her Flovent. She is to let me know if she has to continue using albuterol. Neurology referral for evaluation for her swallowing symptoms being CVA related.

## 2010-10-05 NOTE — Assessment & Plan Note (Signed)
[facility]                            CARDIOLOGY OFFICE NOTE   MEENAKSHI, SAZAMA                        MRN:          [id number]  DATE:04/27/2007                            DOB:          [birth year]    PRIMARY CARE PHYSICIAN:  Sharlot Gowda, MD.   PULMONOLOGIST:  Comer Locket. Vassie Loll, MD.   INTERVAL HISTORY:  [name] is a 76 year old woman with multiple  medical problems including coronary artery disease status post bypass  surgery in September of 2006 as well as a Cox-Maze procedure for atrial  fibrillation.  She also has carotid artery stenosis as well as  fibromyalgia, severe kyphoscoliosis, with a huge hiatal hernia and  gastroesophageal reflux disease.  She returns today for routine  followup.  Overall, she is doing fairly well, she says she continues to  have occasional chest pains about once a week.  This can happen with  exertion or at rest, she takes a nitroglycerin and it resolves quickly.  She also has chronic shortness of breath, which is unchanged.  She has  not had any orthopnea or PND, no focal neurologic events.   CURRENT MEDICATIONS:  1. Asthmanex.  2. Toprol-XL 25 a day.  3. Cartia-XT 120 mg daily.  4. Nasonex.  5. Plavix 75 a day.  6. Protonix 40 b.i.d.  7. Singulair 10 a day.  8. Diovan 40 a day.  9. Crestor 40.   PHYSICAL EXAMINATION:  GENERAL:  She is an elderly lady sitting in a  wheelchair in no acute distress.  There is no respiratory difficulty.  VITAL SIGNS:  Blood pressure is 122/72, heart rate 64, weight is 141.  HEENT:  Normal.  NECK:  Supple, no obvious JVD.  Carotids are 2+ bilaterally with a soft  bruit on the left.  CARDIAC:  PMI is nondisplaced, she is regular, no murmurs, rubs, or  gallops.  LUNGS:  Clear with decreased breath sounds at the bases.  ABDOMEN:  Soft, nontender, and nondistended, there is no  hepatosplenomegaly, no bruits, no masses.  EXTREMITIES:  Warm with no cyanosis, clubbing, or edema,  and no rash.  NEURO:  Alert and oriented x3, cranial nerves II-XII are grossly intact,  moves all four extremities without difficulty, affect is appropriate.   EKG shows normal sinus rhythm with occasional PACs at a rate of 64 and  no significant ST or T-wave abnormalities.   ASSESSMENT AND PLAN:  1. Coronary artery disease.  This is stable.  She does appear to have      some chronic stable angina.  I did discuss with her the possibility      of stress testing, but she once again adamantly refuses.  We will      put her on long-acting Imdur to see how she tolerates this.  2. Dyspnea.  This is stable.  She does have severe kyphoscoliosis with      a large hiatal hernia, and this is followed by Pulmonary.  3. Carotid artery stenosis.  She is due for her repeat ultrasound.  4. Hyperlipidemia.  This is followed by  Dr. Susann Givens.  A goal LDL is      less than 70.   DISPOSITION:  Return to clinic in nine months for routine followup.     [name], MD  Electronically Signed    DRB/MedQ  DD: 04/27/2007  DT: 04/27/2007  Job #: [id number]   cc:   Sharlot Gowda, M.D.  Oretha Milch, MD

## 2010-10-06 LAB — CBC WITH DIFFERENTIAL/PLATELET
Basophils Absolute: 0 10*3/uL (ref 0.0–0.1)
Basophils Relative: 0 % (ref 0–1)
HCT: 39.4 % (ref 36.0–46.0)
Lymphocytes Relative: 28 % (ref 12–46)
MCHC: 31 g/dL (ref 30.0–36.0)
Monocytes Absolute: 0.4 10*3/uL (ref 0.1–1.0)
Neutro Abs: 4.1 10*3/uL (ref 1.7–7.7)
Neutrophils Relative %: 64 % (ref 43–77)
Platelets: 223 10*3/uL (ref 150–400)
RDW: 14.1 % (ref 11.5–15.5)
WBC: 6.4 10*3/uL (ref 4.0–10.5)

## 2010-10-07 ENCOUNTER — Telehealth: Payer: Self-pay

## 2010-10-07 NOTE — Telephone Encounter (Signed)
Pt informed of labs Mertice Uffelman 

## 2010-10-08 NOTE — Assessment & Plan Note (Signed)
[facility]                            CARDIOLOGY OFFICE NOTE   [name], Lori                        MRN:          [id number]  DATE:09/08/2006                            DOB:          07/02/1928    PRIMARY CARE PHYSICIAN:  Dr. Sharlot Gowda.   PULMONOLOGIST:  Dr. Danice Goltz.   INTERVAL HISTORY:  Ms. [name] is a 77 year old woman with multiple  medical problems including coronary artery disease, status post bypass  surgery in September of 2006 as well as a Cox-Maze procedure for atrial  fibrillation.  Also has carotid artery stenosis as well as fibromyalgia,  scoliosis, and a chronic cough.  She returns today for routine followup.   The last time we saw her we switched her over from an ACE inhibitor to  ARB to see if this would help with the cough.  Dr. Delton Coombes in the  Pulmonary Clinic also made a few changes but she continues to have a  cough which she says keeps her up at night.  She also says that related  she has some chest pain, but she denies any angina and she has chronic  shortness of breath which has not changed.  She denies any orthopnea,  PND, or lower extremity edema.  She does have diffuse muscle aches and  joint aches which she relates to fibromyalgia.   CURRENT MEDICATIONS:  1. Asmanex 2 puffs a day.  2. Toprol XL 25 a day.  3. Diltiazem ER 120 a day.  4. Plavix 75 a day.  5. Protonix 40 b.i.d.  6. Diovan 40 a day.  7. Celebrex.  8. Lipitor 80 nightly.   PHYSICAL EXAMINATION:  She is an elderly woman, in no acute distress,  she ambulates with a cane, no respiratory difficulty.  Blood pressure is  125/58, heart rate 75, weight is 141.  HEENT:  Normal.  NECK:  Supple, there is no JVD, carotids are 2+ bilaterally, there is a  soft bruit on the left.  CARDIAC:  She is mildly irregular with an S4 and a soft systolic  ejection murmur at the left sternal border. No rub.  LUNGS:  Clear with no wheezing.  ABDOMEN:  Soft,  nontender, nondistended, there is no hepatosplenomegaly,  no bruits, no masses.  EXTREMITIES:  Are warm with no cyanosis, clubbing, or edema. No rash.  Alert and oriented x3, cranial nerves II-XII grossly intact, move all 4  extremities without difficulty.  Affect is appropriate.   EKG shows a sinus rhythm with a first degree AV block and frequent PACs.  PR interval is 210 milliseconds, no significant ST-T wave abnormalities.   1. Coronary artery disease.  She appears quite stable without any      obvious angina.  However, given her various aches and pains it is a      bit difficult to sort this out.  We did discuss the possibility of      doing a followup Myoview but she said she was uninterested in      repeat a stress test at this time.  We  will continue current      therapy.  2. Hypertension, well-controlled, followup by Dr. Susann Givens.  3. Hyperlipidemia, continue Lipitor.  This is also followed by Dr.      Susann Givens.  We will titrate as necessary to keep her LDL below 70.  4. Carotis artery stenosis.  She has a 60% - 79 stenosis on the right      which is stable and a 40% -59% stenosis on the left which has      progressed over the past year or so.  We will follow up again in 6      months.   DISPOSITION:  Return to Clinic in 9 months for routine followup.     [name], MD  Electronically Signed    DRB/MedQ  DD: 09/08/2006  DT: 09/09/2006  Job #: [id number]   cc:   Lori Shelter, MD  Sharlot Gowda, M.D.

## 2010-10-08 NOTE — Op Note (Signed)
[name], [name] NO.:  [id number]   MEDICAL RECORD NO.:  [id number]          PATIENT TYPE:  INP   LOCATION:  2303                         FACILITY:  [facility]   PHYSICIAN:  Kerin Perna, M.D.  DATE OF BIRTH:  1929/01/15   DATE OF PROCEDURE:  02/04/2005  DATE OF DISCHARGE:                                 OPERATIVE REPORT   OPERATION:  1.  Coronary artery bypass grafting x4 (left internal mammary artery to      diagonal, saphenous vein graft to left anterior descending, saphenous      vein graft to obtuse marginal, saphenous vein graft to posterior      descending).  2.  Modified maze procedure.  3.  Placement of intra-aortic balloon pump.   PREOPERATIVE DIAGNOSES:  1.  Severe two-vessel coronary disease.  2.  Moderate left ventricular dysfunction.  3.  Paroxysmal atrial fibrillation.   POSTOPERATIVE DIAGNOSES:  1.  Severe two-vessel coronary disease.  2.  Moderate left ventricular dysfunction.  3.  Paroxysmal atrial fibrillation.   SURGEON:  Kerin Perna, M.D.   ASSISTANT:  Jerold Coombe, P.A.-C.   ANESTHESIA:  General.   INDICATIONS:  The patient is a 77 year old female with a history of coronary  disease, status post prior PCI with a stent in the circumflex at the time of  a prior MI.  She presented with symptoms of unstable angina and cardiac  catheterization demonstrated left main and three-vessel coronary disease  with EF of 40%.  She also had a history of paroxysmal atrial fibrillation  has been on Coumadin for the past 3 years and the atrial fibrillation had  been difficult to control.  She was initially felt to be a potential  candidate for PCI; again, however, her left main stenosis was felt to be  significant and surgical evaluation was felt to be indicated.   Prior to surgery, I examined the patient several times in her hospital room  and reviewed the results of the cardiac cath with the patient and her  daughter [name].  I  discussed the indications and expected benefits of  coronary artery bypass surgery for treatment of her coronary artery disease.  I reviewed the alternatives to surgical therapy as well.  I discussed the  major plan of the operation including the choice of conduit for grafts, the  use of general anesthesia and cardiopulmonary bypass, and the expected  postoperative hospital recovery.  I discussed with her the risks to her of  coronary artery bypass surgery including risks of MI, CVA, bleeding, blood  transfusion requirement, infection, and death.  I also reviewed with her the  maze procedure, which she strongly wished because of her difficulty with  atrial fibrillation and her Coumadin requirement.  I discussed with her that  the maze procedure would add an additional time of surgery to her procedure,  but I agreed with her that the benefits of the procedure would be justified  by the extra risk and time of surgery in order to free her of long-term  Coumadin requirement as she gets older.  After reviewing all these issues  with the patient and her daughter [name], the patient expressed her  understanding and agreed to proceed with the operation as planned under what  I felt was an informed consent.   OPERATIVE FINDINGS:  The saphenous vein was harvested endoscopically from  the right leg.  It was of adequate quality, but somewhat small below the  knee.  The internal mammary artery was a small 1-mm vessel and had  suboptimal flow and for that reason, was placed to the diagonal  instead of  the LAD graft.  The patient initially was separated from bypass and stable  after protamine was administered; however, she developed hypotension and  required reinstitution of cardiopulmonary bypass and a balloon pump was  placed in order to was successfully separate her from bypass the second  time.   PROCEDURE:  The patient was brought to operating room and placed supine on  the operating table,  where general anesthesia was induced under invasive  hemodynamic monitoring.  The chest, abdomen and legs were prepped with  Betadine and draped as a sterile field.  A sternal incision was made as the  saphenous vein was harvested endoscopically from the large right leg.  The  left internal mammary artery was harvested as a pedicle graft from its  origin at the subclavian vessels.  It was a small vessel and had adequate,  but not great flow.  Heparin was administered and the ACT was documented as  being therapeutic for the aprotinin protocol.  The sternal retractor was  placed and the pericardium was opened and suspended.  Pursestrings were  placed in the ascending aorta and right atrium, and the patient was  cannulated and placed on bypass.  A second pursestring was placed in the  lower right atrium for bicaval atrial drainage.  The coronaries were  identified for grafting and cardioplegia catheters were placed for both  antegrade aortic and retrograde coronary sinus cardioplegia.   After institution of bypass, the external portion of the modified maze  procedure was performed.  The left pulmonary veins were dissected and  encircled with a Vesseloop.  The bipolar Medtronic radiofrequency ablation  clamp was then placed around the left-sided pulmonary veins at the junction  with the left atrium in order to create an ablation line, which was  completed according to the bipolar algorithm.  The left atrial appendage was  then ablated using the bipolar Medtronic radiofrequency ablation clamp  according to protocol.  Finally, the right atrial appendage was ablated with  the bipolar radiofrequency Medtronic device.  The left atrial junction to  the right atrium in the interatrial groove was dissected out to expose the  roof of the left atrium for later left atriotomy.   When the saphenous vein was ready, the mammary artery and veins were prepared for the distal anastomoses and the patient was  cooled to 30  degrees.  The aortic crossclamp was applied.  Eight hundred milliliters of  cold blood cardioplegia were delivered in split doses between the antegrade  aortic and retrograde coronary sinus catheter.  Cardioplegia was then again  delivered every 20 minutes during the procedure while the aorta was  crossclamped.   The distal coronary anastomoses were performed first.  The posterior  descending was a 1.5-mm vessel that had a proximal 95% stenosis.  A reversed  saphenous vein was sewn end-to-side with a running 7-0 Prolene and there was  good flow through graft.  The second distal anastomosis  was to the LAD.  This was a 1.5-mm vessel with high-grade severe proximal stenosis.  A 1-mm  probe could probe proximally.  A reversed saphenous vein was sewn end-to-  side with running 7-0 Prolene and there was good flow through graft.  Cardioplegia was redosed.  The third distal anastomosis was to the OM.  This  is a 1.5-mm vessel with a proximal stent and proximal 80% stenosis.  A  reversed saphenous vein was sewn end-to-side with a running 7-0 Prolene and  there was good flow through graft.  Cardioplegia was redosed.  The fourth  distal anastomosis was to the diagonal branch of the LAD.  This was a 1.4-mm  vessel, heavily diseased proximally, and a 1-mm vessel distally.  The left  IMA pedicle was brought through an opening in the left pericardium and was  brought down onto the diagonal and sewn end-to-side with a running 8-0  Prolene.  There was good flow through the anastomosis after briefly flashing  the pedicle bulldog on the mammary pedicle.  The pedicle was secured to the  epicardium and cardioplegia was redosed.   A left atriotomy was then performed.  This created the anterior ablation  line around the right-sided pulmonary veins.  The Medtronic bipolar  radiofrequency ablation device was then placed on the inferior aspect of the  left atrium through the atriotomy incision to  complete the ablation line on  the inferior aspect of the left atrium around the right-sided pulmonary  veins.  The bipolar device was then used to create an ablation line  extending from the right-sided pulmonary vein ablation line to the left-  sided pulmonary vein ablation line on the floor of the left atrium with 1  jaw of the bipolar clamp outside the atrium and 1 jaw inside the atrium.  Next, the unipolar ablation pen was used to complete the ablation line  connection between the left-sided pulmonary veins and the mitral valve  annulus in the P3 segment.  A second unipolar ablation line was completed  from the left-sided pulmonary vein ablation line to the ablation line around  the left atrial appendage.  This completed the modified maze procedure.  The  atriotomy was then closed in 2 layers using running 4-0 Prolene.   While the crossclamp was still in place, 3 proximal vein anastomoses were placed on the ascending aorta using a 4.0-mm punch with running 6-0 Prolene.  Prior to releasing the clamp, air was vented from the coronaries and the  left side of the heart using a dose of retrograde warm blood cardioplegia  and the usual de-airing maneuvers on bypass.   The patient was reperfused and rewarmed.  Temporary pacing wires were  applied.  The lungs re-expanded.  When the patient had been adequately  reperfused and rewarmed, she was weaned from bypass on low-dose dopamine.  The patient initially separated from bypass and was hemodynamically stable.  Protamine was administered without adverse reaction.  Several minutes after  protamine administration, the patient's blood pressure began to fall and did  not respond to increasing the dopamine or pacing intervention.  Heparin was  re-administered and the patient was placed back on bypass.  The arterial  cannula was still in place and through the venous pursestring, a new venous  two-stage cannula was placed.  The patient was then  rested on bypass.  The  transesophageal 2-D echo showed some lateral wall dysfunction.  The EKG  showed no real ischemic changes.  An attempt  to separate from bypass was  then made with increased inotropic support; however, this was not successful  and so a right femoral artery balloon pump was placed and with balloon pump  support and low-dose inotropes, the patient successfully separated from  bypass with actually high blood pressures and stable cardiac output.  The 2-  D echo showed improved global LV function.  The patient was in a sinus or  atrially paced rhythm during this period.  The patient, after separation  bypass, was given protamine again without adverse reaction and the cannulas  were all removed and the mediastinum was irrigated with warm antibiotic  irrigation.  The leg incision was irrigated and closed in a standard  fashion.  The superior pericardial fat was closed over the aortic and vein  graft.  Two mediastinal and a left pleural chest tube were placed and  brought through separate incisions.  The sternum was closed with interrupted  steel wire.  The pectoralis fascia was closed with a running Vicryl.  The  subcutaneous and skin layers were closed with a running Vicryl and sterile  dressings were applied.  Total bypass time including the first period of the  bypass and a second period of bypass was 285 minutes.      Kerin Perna, M.D.  Electronically Signed     PV/MEDQ  D:  02/11/2005  T:  02/12/2005  Job:  [id number]   cc:   [facility] Office    Cardiology

## 2010-10-08 NOTE — Assessment & Plan Note (Signed)
[facility]                             PULMONARY OFFICE NOTE   NAME:Goldenstein, MILTON STREICHER                        MRN:          [id number]  DATE:04/07/2006                            DOB:          [birth year]    This is a 77 year old white female who has severe gastroesophageal  reflux with a laryngopharyngeal component. She has severe  kyphoscoliosis, which aggravates the above. The patient also has  coronary artery disease and is status post MI, as well as status post  cardiac catheterization. She presents today having had difficulties with  streaky hemoptysis, approximately a week ago. She was seen at the  emergency room and the patient states that no x-ray was done. She was  given antibiotics and instructions to followup with her primary  physician, Dr. Susann Givens. The patient states that the hemoptysis occurred  only one time, again it was streaky hemoptysis, intermixed with  mucopurulent sputum. The patient denies any symptomatology today, other  than her chronic with dyspnea on exertion which she has had of  longstanding. She also has noted some persistent cough since the  incident.   She again, has no other issues noted.   CURRENT MEDICATIONS:  As noted on the intake sheet. These have been  reviewed and are accurate.   PHYSICAL EXAMINATION:  VITAL SIGNS: Noted oxygen saturation 96%.  GENERAL: This is a chronically ill-appearing female who is no acute  distress.  HEENT: Unremarkable.  NECK: Supple, no adenopathy noted. No JVD.  LUNGS: Clear to auscultation bilaterally.  CARDIAC: Regular rate and rhythm. No rubs, murmurs or gallops heard.  EXTREMITIES: No cyanosis, clubbing, or edema noted. Note is made that  the patient has severe kyphosis, which keeps her almost in a bent over  position. This of course, aggravates her gastroesophageal reflux.   IMPRESSION:  1. Severe gastroesophageal reflux with chronic silent aspiration and a  laryngopharyngeal component. This is a main component of patient's      cough.  2. Isolated episode of streaky hemoptysis. The patient already treated      by EDP and primary care physician. I suspect this was due to      tracheobronchitis.   PLAN:  1. The patient to continue her medications as they are. She requested      samples of Protonix which we did not have. I did, however, provide      her with some samples of AcipHex.  2. Followup will be in 4-6 weeks. A chest x-ray will be performed      either today or on return      appointment. This will be the patient's choice.  3. The patient is to contact us again should hemoptysis recur prior to      her followup appointment.     Gailen Shelter, MD  Electronically Signed    CLG/MedQ  DD: 04/13/2006  DT: 04/13/2006  Job #: [id number]

## 2010-10-08 NOTE — Op Note (Signed)
[name], [name] NO.:  [id number]   MEDICAL RECORD NO.:  [id number]          PATIENT TYPE:  INP   LOCATION:  2303                         FACILITY:  [facility]   PHYSICIAN:  Kerin Perna, M.D.  DATE OF BIRTH:  04/01/1929   DATE OF PROCEDURE:  02/14/2005  DATE OF DISCHARGE:                                 OPERATIVE REPORT   OPERATION:  Placement of right chest tube.   PREOPERATIVE DIAGNOSIS:  Postoperative right pleural effusion.   POSTOPERATIVE DIAGNOSIS:  Postoperative right pleural effusion.   SURGEON:  Kerin Perna, M.D.   ANESTHESIA:  Local 1% lidocaine.   PROCEDURE:  The patient is a 77 year old female who had underwent CABG and a  modified maze procedure on February 10, 2005.  A postoperative chest x-ray  had shown progressive increase in right pleural effusion and the patient is  still oxygen-dependent with somewhat decreased mobility due to her  degenerative arthritis.  Drainage of the effusion was felt to be indicated  to optimize her pulmonary status and help promote her recovery.  I discussed  the procedure with the patient prior to surgery and she understood the  indications, benefits and risks.   PROCEDURE:  The patient was placed supine in her ICU bed and premedicated  with 2 mg of intravenous morphine.  The right chest was prepped and draped  as a sterile field.  1% lidocaine was infiltrated in the right breast  crease.  A 2-cm incision was made.  Lidocaine was infiltrated in the  subcutaneous tissue and the intercostal muscle.  The right pleural space was  entered in the 5th interspace and serosanguineous fluid drained immediately.  A 20-French chest tube was placed into the right pleural space and directed  to the apex.  It was secured in the skin with 2 silk sutures.  A sterile  dressings was applied.  A post-procedure chest x-ray showed the chest tube  to be in good position with significant decrease in the right pleural  effusion.   There were no complications.      Kerin Perna, M.D.  Electronically Signed     PV/MEDQ  D:  02/14/2005  T:  02/15/2005  Job:  [id number]

## 2010-10-08 NOTE — Cardiovascular Report (Signed)
[place]  Patient:    [name], [name] Visit Number: [id number] MRN: [id number]          Service Type: MED Location: [facility] [id number] Attending Physician:  Jonelle Sidle Dictated by:   Everardo Beals Juanda Chance, M.D. [facility] Proc. Date: 06/30/01 Admit Date:  06/30/2001   CC:         Ronnald Nian, M.D.  Jonelle Sidle, M.D. [facility]  Cardiac Catheterization Laboratory   Cardiac Catheterization  CLINICAL HISTORY:  Ms. [name] is 77 years old and has a history of hypertension and hyperlipidemia, but no known prior heart disease.  She had intermittent chest pain yesterday and today at 11 a.m. she developed continuous pain and came into the emergency room at 12:40 where an EKG showed an acute inferolateral and posterior wall myocardial infarction.  She was seen by Dr. Nona Dell and given heparin and Plavix and Integrilin and transported to the cardiac catheterization laboratory for intervention.  She was consented for the EMERALD trial.  DESCRIPTION OF PROCEDURE:  The procedure was performed via the right femoral artery using arterial sheath and 6-French preformed coronary catheters.  A front wall arterial puncture was performed and Omnipaque contrast was used. After completion of the diagnostic study, we made the decision to proceed with intervention on the circumflex artery.  The patient was randomized to no distal protection as part of the EMERALD trial.  She was given additional heparin to prolong the ACT to greater than 200 seconds and the Integrilin was continued.  We used a 3.0 7-French Voda guiding catheter with side-holes and a short luge wire.  We crossed the lesion in the mid circumflex with the wire without much difficulty.  We initially predilated with a 2.5 x 15 mm Quantum Maverick, performing one inflation of 8 atm for 30 seconds.  We then deployed a 2.25 x 13 mm Pixel stent, deploying this with two inflations of 8 atm and 12 atm  for 30 seconds.  A repeat diagnostic study was then performed through the guiding catheter.  The patient tolerated the procedure well and left the laboratory in satisfactory condition.  RESULTS: Left main coronary artery:  The left main coronary artery was free of significant disease.  Left anterior descending artery:  The left anterior descending artery gave rise to a large diagonal branch and two septal perforators.  There was 40% narrowing in the proximal LAD and 40% and 50% narrowings in the mid LAD. There was 50% ostial narrowing in the first diagonal branch and 50% narrowing in the proximal portion of this vessel and an 80% narrowing in the mid portion of this vessel.  Circumflex artery:  The circumflex artery gave rise to a marginal branch and a small AV branch.  There was 40% ostial stenosis.  There was complete occlusion just before the marginal branch.  Right coronary artery:  The right coronary artery was a moderately large vessel that gave rise to a conus branch, a right ventricular branch, a posterior descending branch, and three posterolateral branches.  There was 30% proximal, 40% mid, and 30% distal stenosis in the right coronary artery and there was 40% narrowing in the proximal portion of the posterior descending branch.  LEFT VENTRICULOGRAM:  The left ventriculogram performed in the RAO projection showed hypokinesis in the posterior wall as evidenced in the RAO ventriculogram as a mid density which did not change.  The estimated ejection fraction in the RAO projection was 55%.  The left  ventriculogram performed in the LAO projection showed akinesis of the posterior wall.  The septum and apex moved well.  Following stenting of the lesion in the mid circumflex artery, the stenosis improved from 100% to 0% and the flow improved from TIMI-0 to TIMI-3 flow. Blush was somewhat difficulty to assess despite a long cine run.  The patient had the onset of chest pain  at 11 a.m. and presented to [facility] Emergency Room at 12:40.  The first balloon inflation was at 1533. this gave a door to balloon time of 2 hours and 53 minutes and a reperfusion time of 4 hours and 33 minutes.  CONCLUSIONS: 1. Acute inferior, posterior, and lateral wall myocardial infarction with 40%    ostial and total occlusion in the mid circumflex artery; 40% proximal and    50% stenosis in the left anterior descending artery with an 80% stenosis in    the diagonal branch; 40% mid and 30% distal stenosis in the right coronary    artery with 40% narrowing in a posterior descending branch and posterior    wall akinesis. 2. Successful stenting of the circumflex artery with improvement in the    percent of diameter narrowing from 100% to 0% and improvement in the flow    from TIMI-0 to TIMI-3 flow.  DISPOSITION:  The patient is returned to the postangioplasty unit for further observation.  We will classify the patient as low-risk and target a discharge on day #2, which will be on Monday. Dictated by:   Everardo Beals Juanda Chance, M.D. [facility] Attending Physician:  Jonelle Sidle DD:  06/30/01 TD:  07/01/01 Job: [id number]

## 2010-10-08 NOTE — H&P (Signed)
[place]. [facility]  Patient:    [name], [name] Visit Number: [id number] MRN: [id number]          Service Type: MED Location: 2000 2007 01 Attending Physician:  Jonelle Sidle Dictated by:   Jonelle Sidle, M.D. [facility] Admit Date:  06/30/2001 Discharge Date: 07/02/2001   CC:         Geraldo Pitter, M.D.   History and Physical  DATE OF BIRTH:  [birth year]  PRIMARY CARE PHYSICIAN:  Geraldo Pitter, M.D.  CHIEF COMPLAINT:  Chest and back discomfort.  HISTORY OF PRESENT ILLNESS:  [name] is a 77 year old woman with a past medical history outlined below, who presents with a two-day history of intermittent chest and mid back discomfort.  She began to develop symptoms at midday on June 29, 2001, without specific precipitant.  She described an "ache" in her mid back and lower chest with some associated nausea and dizziness.  She had no specific palpitations, diaphoresis, or radiation of discomfort at that time.  Her symptoms waxed and waned throughout the day, finally subsiding by the evening.  She went to bed and awoke this morning without symptoms, but developed recurrence at 11 a.m.  She has had persistent symptoms since that time, which became more intense, prompting evaluation in the [facility] Emergency Department.  A 12-lead electrocardiogram performed initially revealed minor ST elevation inferiorly. However, a subsequent 12-lead electrocardiogram with persistent symptoms despite heparin and nitroglycerin showed clear evidence of an inferior and posterolateral myocardial infarction.  The patient has no known history of coronary artery disease or previous myocardial infarction.  ALLERGIES:  ASPIRIN and AMITRIPTYLINE.  The patient specifically states that aspirin caused a rash.  It is unclear whether she has had any respiratory difficulties with aspirin previously.  PAST MEDICAL HISTORY: 1. Hypertension reportedly of  at least 20 years duration. 2. Reported borderline type 2 diabetes mellitus.  The patient states that she    has never been on medical therapy for this and has been managed with diet    alone. 3. Reported history of dyslipidemia, currently not on medical therapy and    being managed by diet at this time.  I have no data regarding fasting lipid    profile currently. 4. History of gastroesophageal reflux disease and hiatal hernia. 5. Osteoporosis and osteoarthritis. 6. Status post history of hysterectomy with bilateral salpingo-oophorectomy.  CURRENT MEDICATIONS:  The patient does not have a current list with her, but through discussions with the patients daughter, it appears that she is taking labetalol, Prempro, diazepam, Tums p.r.n., and Aleve p.r.n..  SOCIAL HISTORY:  The patient lives in [place], [place], with her daughter.  She denies alcohol use and has a remote history of tobacco use.  FAMILY HISTORY:  The patients mother died of a myocardial infarction at age 20.  There is no specific history of premature coronary atherosclerosis noted.  REVIEW OF SYSTEMS:  As described in the history of present illness.  Otherwise the patient complains of occasional sweats and dyspnea with exertion and also at rest.  Currently she also has nausea and has some minor emesis.  She also complains of reflux symptoms.  Otherwise the review of systems is negative.  PHYSICAL EXAMINATION:  Temperature 96.9 degrees, heart rate 80-90 and regular, respiratory rate 25 and mildly labored, blood pressure 150/88, oxygen saturation 100% on room air.  GENERAL APPEARANCE:  This is a well-nourished woman in mild distress secondary to pain.  HEENT:  The conjunctivae and lids are normal.  The oropharynx is clear.  NECK:  Supple without elevated jugular venous pressure or carotid bruits.  No thyroid tenderness or enlargement is noted.  LUNGS:  Clear to auscultation bilaterally without wheezes or  rales. Respiratory effort is mildly labored at rest.  CARDIOVASCULAR:  A nondisplaced PMI with normal S1 and S2.  There is no S3 or significant murmur noted.  No pericardial rub is noted.  ABDOMEN:  Soft and nontender without obvious hepatomegaly or bruits.  EXTREMITIES:  No clubbing, cyanosis, or edema.  Peripheral pulses are 2+ without bruits.  SKIN:  No ulcerative lesions noted.  MUSCULOSKELETAL:  No kyphoscoliosis noted.  NEUROPSYCHIATRIC:  The patient is alert and oriented x 3.  Affect is normal.  LABORATORY DATA:  The chest x-ray shows normal cardiac silhouette with no acute infiltrate or edema.  The 12-lead electrocardiogram shows normal sinus rhythm at 83 beats per minute with 1-2 mm of ST elevation in leads 2, 3, and aVF, 1-2 mm ST elevation in leads V5 and V6, and 1-2 mm of ST segment depression in leads V1-V3.  These changes are consistent with an acute inferior posterolateral myocardial infarction.  Right-sided leads show ST segment depression of approximately 1 mm in lead V4R.  This suggests a circumflex lesion as the culprit.  Sodium 135, potassium 3.8, BUN 14, creatinine 0.6, glucose 159.  WBC 9.6, hemoglobin 12.1, hematocrit 35.3, platelets 342.  INR 1.0.  Total CK 350, CK-MB 30, troponin I 0.21.  ASSESSMENT AND PLAN: 1. Acute inferior posterolateral myocardial infarction in a 77 year old woman    with a history of longstanding hypertension, reported dyslipidemia, and    reported borderline type 2 diabetes mellitus.  The patient is continuing to    have chest discomfort, although it has improved with treatment.  Currently    the patient is on heparin, nitroglycerin, and Integrilin and we plan to    load with Plavix and treat with morphine and Lopressor.  I have discussed    the need for emergent cardiac catheterization with the patient and her    daughter and they agree to proceed.  Catheterization lab personnel,    research nurse, and Everardo Beals. Juanda Chance, M.D.,  have been apprised of the    situation.  2. Hypertension, presumably managed with labetalol.  Will most likely need to    adjust the patients medical regimen to include a beta blocker and ACE    inhibitor. 3. Reported history of dyslipidemia.  Will start statin therapy and check a    fasting profile as a baseline.  It may in fact be falsely low if in fact    her acute coronary syndrome has been present over the last 48 hours as her    history might suggest. 4. Reported aspirin allergy that sounds to be significant.  Will treat with    Plavix as discussed above. 5. Reported history of borderline type 2 diabetes mellitus, controlled with    diet.  Will check a hemoglobin A1C. 6. Reported history of gastroesophageal reflux disease and hiatal hernia.    Currently the patient is stable in terms of her hemoglobin and hematocrit. Dictated by:   Jonelle Sidle, M.D. [facility] Attending Physician:  Jonelle Sidle DD:  06/30/01 TD:  06/30/01 Job: [id number]

## 2010-10-08 NOTE — Discharge Summary (Signed)
[name], [name]                 ACCOUNT NO.:  [id number]   MEDICAL RECORD NO.:  [id number]          PATIENT TYPE:  IPS   LOCATION:  4039                         FACILITY:  [facility]   PHYSICIAN:  Ellwood Dense, M.D.   DATE OF BIRTH:  1928/10/17   DATE OF ADMISSION:  02/18/2005  DATE OF DISCHARGE:  02/25/2005                                 DISCHARGE SUMMARY   DISCHARGE DIAGNOSES:  1.  Deconditioning, status post coronary artery bypass graft.  2.  Coronary artery disease.  3.  Atrial fibrillation.  4.  Congestive heart failure compensated.  5.  Hypokalemia, resolved.  6.  Asthma.   HISTORY OF PRESENT ILLNESS:  [name] is a 77 year old female with history  of coronary artery disease with stent and residual disease admitted for  cardiac catheterization secondary to abnormal Cardiolite on September 11.  Cardiac catheterization showed progression of disease with EF of 40% and the  patient underwent CABG x4 on September 21, by Dr. Donata Clay.  Postop, has  had issues with PAF as well as fluid overload.  Follow up chest x-ray did  show right pleural effusion requiring chest tube placement on September 25.  This was discontinued on September 26, with last chest x-ray of September  28, showing improvement in aeration.  The patient has had some problems with  diarrhea earlier in the week and C. difficile check was negative.  Currently, the patient is noted to be deconditioned with shortness of breath  with activity requiring frequent rest breaks for ambulation.  Continues to  require frequent cueing for posture.  Also of note, the patient was noted to  have abnormal LFTs and abnormal amylase with last check showing AST 52, ALT  132, Alk phos 125, amylase 181.  These are noted to be resolving.  Currently, the patient's heart rate is controlled off of amiodarone and  Cardizem drip.  She is requiring assist for transfers and mobility.  Rehabilitation was consulted for further therapies.   PAST MEDICAL HISTORY:  1.  Glaucoma, left eye.  2.  Eczema.  3.  Kyphosis.  4.  Balance problems.  5.  Hypoglycemia.   PAST SURGICAL HISTORY:  1.  Right carotid artery stenosis 60-70%.  2.  Hysterectomy and BSO.  3.  Asthma.  4.  History of panic attacks.   ALLERGIES:  ELAVIL, ASPIRIN and ZOCOR.   SOCIAL HISTORY:  The patient lives with daughter.  She was independent with  cane prior to admission.  She does not use any tobacco or alcohol.  She  lives in one-level home with two steps at entry.   LABORATORY DATA AND X-RAY FINDINGS:  CBC on September 30, reveals hemoglobin  of 10.6, hematocrit 31.0, white count 7.2, platelets 345.  Last check of  electrolytes on October 5, revealed sodium 135, potassium 4.2, chloride 102,  CO2 24, BUN 17, creatinine 0.6, glucose 115.  Albumin 3.4.  AST 26, ALT 44,  Alk phos 0.8, total bilirubin 0.9.   HOSPITAL COURSE:  [name] was admitted to rehabilitation on  February 18, 2005, for inpatient therapies  to consist of PT/OT daily.  Past  admission, the patient was continued on Coumadin for her PAF.  Lovenox was  added as Coumadin subtherapeutic.  At time of discharge, INR is finally  therapeutic at 2.3 on 5 mg of Coumadin a day.  Home health will draw next  pro time on October 9, with results to Coumadin Clinic at [facility].  The  patient's leg graft sites have healed well without any signs or symptoms of  infection.  Edema in right lower extremity has almost resolved.  Prior chest  tube sites healing well without any signs or symptoms of infection.   The patient's endurance is slowly improving.  No symptoms of angina with  increased activity.  By the time of discharge, the patient had progressed to  being at modified independent level for transfers, modified independent for  ambulating 50-60 feet with walker.  She is modified independent for ADLs and  toileting.  Further followup home therapies to include home health PT/OT by  [facility] services.   DISCHARGE MEDICATIONS:  1.  Toprol XL 25 mg a day.  2.  Protonix 40 mg a day.  3.  Coumadin 5 mg a day.  4.  K-Dur 20 mEq b.i.d.  5.  Lasix 40 mg a day.  6.  Altace 2.5 mg a day.  7.  Plavix 75 mg a day.  8.  Ultram 50 mg 1/2 to 1 p.o. daily.  9.  Cardizem CD 180 mg a day.  10. Benefiber/Metamucil daily.  11. Mucinex as needed for congestion.  12. MDI one puff per day.   ACTIVITY:  Up with walker with precautions.   DIET:  Regular, low salt diet.   FOLLOW UP:  Follow up with Dr. Donata Clay on November 10, at 1 p.m.  Follow  up with Dr. Susann Givens and Dr. Riley Kill in the next few weeks.  Follow up with  Dr. Ellwood Dense as needed.      Greg Cutter, P.A.    ______________________________  Ellwood Dense, M.D.    PP/MEDQ  D:  02/25/2005  T:  02/26/2005  Job:  [id number]   cc:   Kerin Perna, M.D.  [address]  [place]  [place] [address]   Sharlot Gowda, M.D.  Fax: [phone]   Arturo Morton. Riley Kill, M.D. [facility]  [address]  [address]  [place]  [place] [address]   Danice Goltz, M.D. [facility]  [address] [place], [place] [address]

## 2010-10-08 NOTE — Discharge Summary (Signed)
[place]. [facility]  Patient:    [name], [name] Visit Number: [id number] MRN: [id number]          Service Type: Attending:  Ronnald Nian, M.D. Dictated by:   Ronnald Nian, M.D. Adm. Date:  07/11/01 Disc. Date: 07/14/01                             Discharge Summary  ADMISSION DIAGNOSES: 1. Acute bronchitis. 2. Possible laryngospasm or bronchospasm. 3. Status post myocardial infarction. 4. Reflux esophagitis. 5. Hypertension. 6. History of hyperlipidemia. 7. History of panic attacks. 8. Osteoarthritis. 9. Osteoporosis.  FINAL DIAGNOSES:  Admission probably warranted because of the laryngospasm, although the diagnoses are unchanged.  HISTORY OF PRESENT ILLNESS:  This is a 77 year old lady who was diagnosed with bronchitis because of the productive cough and placed on a Z-Pak at [facility] approximately 2 days prior to admission.  On the day of admission she had noted episodes of choking sensation and had a very bad one which prompted an EMS visit and a trip to the emergency department.  In the emergency department she was treated for bronchospasm, given bronchodilators as well as steroids and admitted for further evaluation of possible bronchospasm versus laryngospasm.  HOSPITAL COURSE:  She was admitted to the hospital and kept on her outpatient medications and also given Rocephin and Zithromax as well as started on Solu-Medrol 125 mg intravenous q.6h. and albuterol inhaler.  Since there was a question of whether she was truly having bronchospasm or laryngospasm the nurses watched her closely and it was determined that her symptoms were more laryngospasm since her pulse oximetry and breathing did not necessarily change.  There was also thought to be a psychological overlay to this.  Her Solu-Medrol was discontinued as was the albuterol.  She continued to improve and was able to be discharged on July 14, 2001.  Of note also is the  fact that her blood sugars were elevated and this was decided to be followed as an outpatient since she was really asymptomatic from this.  CONDITION ON DISCHARGE:  Improved.  FOLLOW UP:  Follow up in my office in approximately one week. Dictated by:   Ronnald Nian, M.D. Attending:  Ronnald Nian, M.D. DD:  09/12/01 TD:  09/12/01 Job: [id number]

## 2010-10-08 NOTE — Discharge Summary (Signed)
[place]. [facility]  Patient:    [name], [name] Visit Number: [id number] MRN: [id number]          Service Type: MED Location: 2000 2007 01 Attending Physician:  Jonelle Sidle Dictated by:   Guy Franco, P.A.-C. [facility] Admit Date:  06/30/2001 Discharge Date: 07/02/2001   CC:         Jonelle Sidle, M.D. [facility]  Ronnald Nian, M.D.   Discharge Summary  DISCHARGE DIAGNOSES 1. Acute posterior myocardial infarction, status post stent to the    circumflex coronary artery on June 30, 2001. 2. Coronary artery disease. 3. Paroxysmal atrial fibrillation. 4. Hyperlipidemia. 5. Hypertension. 6. Gastroesophageal reflux disease.  HISTORY OF PRESENT ILLNESS:  Ms. [name] is a 77 year old female who presented to . [facility] on June 30, 2001, with a 24-hour history of intermittent substernal chest pain.  HOSPITAL COURSE:  In the emergency room her electrocardiogram revealed an acute posterior myocardial infarction, and the patient was taken immediately to the cardiac catheterization laboratory by  Dr. Everardo Beals. [place].  She was found to have:  The LAD with a 50% midlesion, an 80% diagonal-I, circumflex with a 40% ostial which was totally occluded in the midportion, right coronary artery 30% proximal, 40% mid, and 30% distal.  An LV-gram revealed posterior akinesis with an ejection fraction of 55%.  Dr. Juanda Chance then performed a stent placement to the midcircumflex lesion from a 100% stenosis to 0%, going from TIMI-0 to TIMI-3 flow.  During the remainder of the patients hospitalization she remained stable; however, prior to discharge she did develop paroxysmal atrial fibrillation, but by the time she was ready for discharge this had ceased, with an increase of her beta blocker.  Because she was on Plavix, Dr. Juanda Chance and Dr. Lewayne Bunting felt that she would have an increased risk of bleeding if we placed her on Coumadin.  Therefore once  the Plavix has been stopped after one month, we will switch her over to Coumadin.  DISCHARGE LABORATORY DATA:  BUN 7, creatinine 0.6.  Hemoglobin 12.1, hematocrit 35.3, platelets 342.  Total cholesterol 198, triglycerides 158, LDL 117, HDL 49.  Her maximum CPK-MB revealed a CPK of 2105 with MB fraction of 250.4, with a troponin of 0.21.  DISCHARGE MEDICATIONS 1. Lopressor 50 mg p.o. b.i.d. 2. Plavix 75 mg p.o. q.d. 3. Zocor 20 mg q.h.s. 4. Nitroglycerin sublingual p.r.n. chest pain. 5. She may resume her Prempro. 6. She may resume her diazepam.  She is to stop her labetalol.  DIET:  To remain on a low-fat, low-salt, low-cholesterol diet.  FOLLOWUP:  She will follow up with a P.A. visit in one week, and then follow up with Dr. Andee Lineman in one month.  At that time she will need to be switched from Plavix over to Coumadin.  In the meantime she will also have an echocardiogram to assess her LV function and left atrial size. Dictated by:   Guy Franco, P.A.-C. [facility] Attending Physician:  Jonelle Sidle DD:  07/02/01 TD:  07/02/01 Job: [id number]

## 2010-10-08 NOTE — Assessment & Plan Note (Signed)
[facility]                             PULMONARY OFFICE NOTE   NAME:Urbas, [name]                        MRN:          [id number]  DATE:05/12/2006                            DOB:          1929-01-16    SUBJECTIVE:  Ms. Taber is a pleasant 77 year old woman followed by Dr.  Jayme Cloud for cough and dyspnea.  She has a history of severe  kyphoscoliosis as well as GERD that have made her cough and sputum  production difficult to manage.  Today she complains of continued cough  that can be productive at times of clear phlegm, although she also sees  yellowish-green sputum.  She becomes short of breath when she has  paroxysms of cough.  Her Protonix was increased to b.i.d. at her last  visit, but she states that her cough has not really improved.  It  bothers her particularly at night, but also occurs during the day.  She  denies any significant post nasal drip, but she does have occasional  hoarseness.  Her ACE inhibitor has been changed to an Angiotensin  receptor blocker by Dr. Gala Romney.  She is using Asmanex b.i.d. and also  Nasonex.   MEDICATIONS:  1. Asmanex 2 puffs daily.  2. Toprol XL 25 mg daily.  3. Diltiazem ER 120 mg daily.  4. Nasonex just one spray to each nostril q.h.s.  5. Plavix 75 mg daily.  6. Lipitor 40 mg daily.  7. Protonix 40 mg b.i.d.  8. Singulair 10 mg daily.  9. Diovan 40 mg daily.  10.Ambien q.h.s.  11.Meloxicam 15 mg daily.  12.Diazepam p.r.n.  13.NitroQuick p.r.n.  14.Mucinex p.r.n.  15.Robitussin p.r.n.  16.Benzonatate p.r.n.   EXAMINATION:  GENERAL:  This is a pleasant woman who is in no distress.  Her weight is 142 pounds, temperature 97.9, blood pressure 112/66, heart  rate 69, SPO2 98% on room air.  HEENT:  She has a hoarse voice, there is no stridor.  LUNGS:  Significant for decreased breath sounds, particularly right  base, there are no crackles or wheezes heard.  CHEST:  Significant for severe  kyphoscoliosis.  HEART:  Regular rate and rhythm without murmur.  ABDOMEN:  Benign.  EXTREMITIES:  No cyanosis, clubbing, or edema.  Chest x-ray performed today shows some questionable left lower lobe  scarring without any other abnormalities and no overt infiltrates.   IMPRESSION:  1. Chronic cough.  2. Gastroesophageal reflux disease.  3. Probable contribution of post nasal drip or allergic rhinitis to      her cough.   PLANS:  1. I will add Clarinex once daily to her Nasonex to attempt to better      treat any contribution of allergies to her cough.  2. She will continue her proton pump inhibitor b.i.d.  3. She will use guaiphenesin on a p.r.n. basis.  4. Continue Asmanex b.i.d.  5. Follow up with Dr. Jayme Cloud in 2 months to assess improvement in      her cough on this regimen.     Leslye Peer, MD  Electronically Signed  RSB/MedQ  DD: 05/15/2006  DT: 05/15/2006  Job #: [id number]

## 2010-10-08 NOTE — H&P (Signed)
[place]. [facility]  Patient:    [name], [name] Visit Number: [id number] MRN: [id number]          Service Type: MED Location: 2000 2007 01 Attending Physician:  Jonelle Sidle Dictated by:   Reuben Likes, M.D. Admit Date:  06/30/2001 Discharge Date: 07/02/2001   CC:         Ronnald Nian, M.D.   History and Physical  CHIEF COMPLAINT:  "Cough."  HISTORY OF PRESENT ILLNESS:  The patient is a 77 year old female who has had a four-day history of cough productive of yellow-green sputum.  She was given a Z-Pak by [name] two days ago and told to take Robitussin DM, however, the cough is no better.  Today, the patient had episodes of choking. She had three spells like this today and around 4:30, she had a severe spell that was the worst one she had ever had, where it felt like her throat closed up and she could not breathe.  EMTs were called and upon their arrival, they noted some -- what appeared to be -- wheezing.  She was then transported to the emergency room.  The patient states that she has had choking off and on for the past 38 years.  She had an episode of choking noted in the emergency room and stridor was noted.  She has had a temperature of 99.9 and irritated throat.  There is no history of asthma or COPD.  She denies any chest pain or cardiovascular complaints.  PAST MEDICAL HISTORY: Surgeries:  The patient has had a cataract extraction from her right eye.  She had an appendectomy in 1956 and uterine suspension and in 1974, she had a hysterectomy and ovariectomy due to a "tumor the size of a basketball."  Other hospitalizations:  The patient was hospitalized here from February 8th to February 10th of 2003 with an acute MI which was angioplastied and stented by Dr. Everardo Beals. [place].  Other illnesses:  The patient has had a hiatal hernia for the past 38 years, she has had hypertension for the past 30 years and hyperlipidemia  for 20 years.  She suffers from osteoarthritis and osteoporosis and also has had occasional panic attacks.  MEDICATIONS: 1. Premarin 0.625 mg per day on days 1 through 25 of the month. 2. Provera 10 mg per day on days 16 through 25 of the month. 3. Diazepam 2 mg once a day as needed. 4. Metoprolol 50 mg b.i.d. 5. Plavix 75 mg once a day. 6. Zocor 20 mg once a day. 7. Nitroglycerin 1/150 sublingual on a p.r.n. basis. 8. Zithromax.  ALLERGIES:  She is allergic to ASPIRIN, which causes her to break out in a rash, and AMITRIPTYLINE, which also causes a rash.  FAMILY HISTORY:  Mother died at age 78 of a heart problem but also had diabetes.  Her father died at an unknown age of unknown cause.  She had a brother who died at age 84 of malignant melanoma and a daughter who died at age 32 with asthma and epilepsy.  Another daughter has allergies and a son is alive and well.  SOCIAL HISTORY:  The patients husband died 14 years ago.  She lives with her daughter in a single-story home in [place].  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  She smoked cigarettes briefly about 40 years ago but has quit since then but she has used snuff up until a couple of years ago.  She has never used alcohol at all.  REVIEW OF SYSTEMS:  She notes frequent headaches.  Her vision is somewhat blurred.  She has had some diarrheal stools since she started the Zithromax and she has noted urinary incontinence.  She denies any other systemic, skin, eye, ENT, respiratory, cardiovascular, GI, GU or musculoskeletal complaints.  PHYSICAL EXAMINATION:  VITAL SIGNS:  Blood pressure 120/70, pulse 72 and regular, respirations 20, temperature 99.1.  SAO2 on room air was 94%.  GENERAL:  She is alert.  She had frequent coughing episodes.  There was no respiratory distress.  There was no wheezing or stridor noted by me.  SKIN:  Clear, warm and dry.  No rash or breakdown.  EYES:  Pupils are equal, round and reactive to  light.  Full EOMs.  Fundi benign.  She is status post cataract extraction on the right and has a cataract on the left.  Sclerae nonicteric.  ENT:  TMs are normal.  No intraoral lesions.  Mucous membranes were moist. Pharynx was clear.  NECK:  Supple.  No adenopathy or tenderness.  No JVD or bruit.  Thyroid was normal.  LUNGS:  Clear to auscultation and percussion.  No wheezes, rales or rhonchi.  HEART:  Regular rhythm.  No gallop or murmur.  BREASTS:  No masses.  ABDOMEN:  Soft, nontender.  No organomegaly or mass.  Bowel sounds normally active.  No pulsatile midline abdominal mass or bruit.  EXTREMITIES:  No pedal edema.  Pedal pulses were full.  No calf tenderness and Homans sign was negative.  NEUROLOGICAL:  Alert and oriented x3.  Speech was clear and appropriate.  No extremity weakness or tremor.  DTRs were 2+ and symmetrical.  Babinskis were downgoing.  Cranial nerves were intact.  LABORATORY AND ACCESSORY DATA:  Chest x-ray was within normal limits as were soft tissue views of the neck.  An EKG showed normal sinus rhythm with nonspecific ST-T wave changes.  IMPRESSION: 1. Acute bronchitis. 2. Laryngospasm or bronchospasm. 3. Status post myocardial infarction. 4. Reflux esophagitis. 5. Hypertension. 6. History of hyperlipidemia. 7. History of panic attacks. 8. Osteoarthritis. 9. Osteoporosis.  PLAN: 1. Admit to regular floor. 2. IV Rocephin, Solu-Medrol and p.o. Zithromax. 3. Albuterol by nebulizer. 4. Will hold the Lopressor for right now. 5. Protonix to control her reflux symptoms. Dictated by:   Reuben Likes, M.D. Attending Physician:  Jonelle Sidle DD:  07/11/01 TD:  07/12/01 Job: [id number]

## 2010-10-08 NOTE — Discharge Summary (Signed)
[name], [name] NO.:  [id number]   MEDICAL RECORD NO.:  [id number]          PATIENT TYPE:  INP   LOCATION:  2011                         FACILITY:  [facility]   PHYSICIAN:  Kerin Perna, M.D.  DATE OF BIRTH:  07/07/1928   DATE OF ADMISSION:  02/04/2005  DATE OF DISCHARGE:  02/18/2005                                 DISCHARGE SUMMARY   HISTORY OF PRESENT ILLNESS:  The patient is a 77 year old female with a  history of lateral wall myocardial infarction and percutaneous coronary  intervention to the circumflex in 2003.  The patient had residual disease in  the left anterior descending of approximately 40-50% as well as 80% diagonal  lesion in the 40% right coronary artery lesion.  The patient had a  Cardiolite study done in 2005 which showed minimal ischemia.  She was  recently seen in July 2006 at the [facility] office and at that time she  complained of chest pain which was somewhat atypical without an exertional  component.  It appeared that her symptoms were slightly worse in the  evening.  She saw Dr. Andee [name] on January 25, 2005 and noted that on several  occasions up to two to three times a week she had substernal chest pressure  which she described as a heaviness and resolved with nitroglycerin.  The  patient was felt to require further evaluation and treatment and was  scheduled for cardiac catheterization this admission.   MEDICATIONS PRIOR TO ADMISSION:  1.  Coumadin.  2.  Digoxin 125 mcg daily.  3.  Protonix 40 mg daily.  4.  Lipitor 20 mg daily.  5.  Toprol XL 25 mg daily.  6.  Imdur 30 mg daily.   PAST MEDICAL HISTORY:  1.  Coronary artery disease as described.  2.  Aortic stenosis, mild.  3.  Bilateral carotid bruits with moderate ICA disease.  4.  History of paroxysmal atrial fibrillation.  5.  Dyslipidemia.  6.  COPD.  7.  Asthma.  8.  Degenerative joint disease.   PAST SURGICAL HISTORY:  1.  Inclusive of hysterectomy.  2.  Right eye  cataract.  3.  Tonsillectomy.   ALLERGIES:  ASPIRIN, AMITRIPTYLINE.  Patient does tolerate Plavix.   HOSPITAL COURSE:  The patient was admitted for cardiac catheterization which  was done by Dr. Riley [name] on February 04, 2005.  Findings included a severe  three vessel coronary disease with high-grade LAD and left main lesion.  For  details of this study please see the cardiac catheterization report.  The  patient was felt to be a candidate for surgical revascularization and  consultation was obtained with Dr. Kathlee Nations [name].  He evaluated the  patient's studies and recommended a waiting period prior to the surgery due  to the use of a Plavix load.  The patient was monitored closely and deemed  to be acceptable for proceeding with surgery and this was scheduled and on  February 10, 2005 she was taken to the operating room where she underwent  the following procedure:  Coronary artery bypass grafting x4.  The following  grafts were placed:  Left internal mammary artery to the diagonal.  Note,  this was done due to a very small internal mammary artery.  Next graft was a  saphenous vein graft to the LAD.  Next was saphenous vein graft to the  posterior descending and the final was a saphenous vein graft to the  circumflex.  Additionally, during the procedure the patient underwent a  modified Cox-Maze procedure for atrial fibrillation.  The patient had  significant left ventricular dysfunction following the procedure and  required placement of an intra-aortic balloon pump for maintenance of  hemodynamics.  Noted during the procedure was that the coronaries were very  small in size with severe disease.   POSTOPERATIVE HOSPITAL COURSE:  Patient's postoperative course was somewhat  prolonged due to her initial difficulties with hemodynamics.  She required  multiple inotropic agents including epinephrine, dopamine, milrinone, and  dobutamine in addition to the balloon pump.  These were all  weaned without  significant difficulty.  Additionally, she was weaned from the ventilator.  The patient's routine lines, monitors, and drainage devices including the  intra-aortic balloon pump were discontinued.  She showed a good and gradual  progress in her rehabilitation; however, she is significantly deconditioned  and will require further rehabilitation stay.  Of note, additionally during  the postoperative period her atrial fibrillation did return.  She has been  started on Coumadin and amiodarone as well as other agents for rate control.  Additionally, she has required aggressive diuresis.  She was also noted to  have a postoperative elevation in liver function studies and amylase but  these showed no clinical problems and they trended down over time.  Her  overall status is felt to be adequate for transfer to rehabilitation for  further physical therapies.   CURRENT MEDICATIONS:  1.  Nasonex one spray daily.  2.  Asmanex one puff daily.  3.  Cardizem 180 mg daily.  4.  Toprol XL 25 mg daily.  5.  Coumadin dose to be determined daily.  6.  Altace 2.5 mg daily.  7.  Lasix 40 mg daily.  8.  Plavix 75 mg daily.  9.  Guaifenesin 600 mg daily.  10. Colace 200 mg q.24h.  11. Dulcolax 10 mg q.24h.  12. Protonix 40 mg daily.   Note, patient's Coumadin dose at home previously was 2.5 mg daily.   ACTIVITY:  The patient will be transferred to subacute care unit for further  rehabilitation activities.   WOUND CARE:  The patient may clean her incisions gently with soap and water.  She should continue pulmonary toilet and aggressive use of her incentive  spirometry.   FINAL DIAGNOSES:  1.  Severe coronary artery disease with presentation of unstable angina.  2.  History of paroxysmal atrial fibrillation on Coumadin.  3.  Postoperative atrial fibrillation.  4.  Status post surgical revascularization and Cox-Maze procedure as     described.  5.  History of mild aortic stenosis.  6.   History of moderate bilateral carotid disease.  7.  History of dyslipidemia.   CONDITION ON TRANSFER:  Stable and improving.      [name], P.A.-C.      Kerin Perna, M.D.  Electronically Signed    WEG/MEDQ  D:  02/18/2005  T:  02/18/2005  Job:  [id number]   cc:   Learta Codding, M.D. [facility]  [address]  [address]  [place]  [place] [address]

## 2010-10-08 NOTE — Cardiovascular Report (Signed)
[name], [name]                 ACCOUNT NO.:  [id number]   MEDICAL RECORD NO.:  [id number]          PATIENT TYPE:  OIB   LOCATION:  1961                         FACILITY:  [facility]   PHYSICIAN:  Arturo Morton. Riley Kill, M.D. [facility] OF BIRTH:  11/16/1928   DATE OF PROCEDURE:  01/31/2005  DATE OF DISCHARGE:                              CARDIAC CATHETERIZATION   INDICATIONS:  [name] is a delightful 77 year old woman who previously  had a lateral wall MI.  This was treated with stenting.  She had residual  disease in the LAD and right.  Recently she has had some recurrent chest  pain.  A Cardiolite shows some questionable ischemia in the circumflex  territory of the previously stented area.  She was brought to the  catheterization laboratory today for further evaluation.   PROCEDURE:  1.  Left heart catheterization.  2.  Selective coronary arteriography.  3.  Selective left ventriculography.   DESCRIPTION OF PROCEDURE:  The patient was brought to the catheterization  laboratory and prepped and draped in the usual fashion.  Through an anterior  puncture the right femoral artery was easily entered.  A 4-French sheath was  placed.  Views of the left and right coronaries were obtained in multiple  angiographic projections.  Central aortic and left ventricular pressures  were measured with a pigtail.  We had no difficulty crossing the aortic  valve despite findings on echocardiography.  Overall systolic function was  preserved.  Pressure pullback was performed which demonstrated no  significant gradient.  All catheters were subsequently removed and the  patient was held at the procedure site.  There were no complications noted.   HEMODYNAMIC DATA:  1.  Central aortic pressure 172/66, mean 105.  2.  Left ventricular pressure 183/80.  3.  On pullback across aortic valve there is no significant functional      gradient.   ANGIOGRAPHIC DATA:  1.  Ventriculography was performed in the RAO  projection.  No segmental wall      motion abnormalities are identified.  2.  The left main demonstrates calcification segmentally throughout the left      main course but there is no significant focal narrowing.  3.  The LAD is also fairly heavily calcified.  The calcification occurs      proximally.  There is no significant narrowing.  The diagonal itself has      about 50% narrowing at its ostium and then about 60% area in the mid      vessel.  This does not appear to be flow-limiting.  The LAD just after      the diagonal of the septal takeoff demonstrates tandem stenoses of about      80-90%.  Beyond this the vessel is a somewhat smaller caliber vessel      that then has a stenosis of about 50% beyond this and then opens back      up.  The distal LAD wraps the apex.  4.  The circumflex has a previously stented vessel.  There is a tiny  insignificant first marginal branch with probably 70-80% proximal      narrowing.  The AV circumflex itself has about 30-40% segmental plaque      at the ostium and then there is a 40% area of narrowing just before a      second marginal branch.  The second marginal branch has about 50% ostial      narrowing, but is a small caliber vessel.  The third large marginal      branch is a vessel that is previously stented and it has probably 30-40%      in-stent area narrowing, but without significant flow-limiting issues.      The distal vessel which represents the third OM is without critical      narrowing.  5.  The right coronary artery is a dominant branch providing a posterior      descending and a smaller posterolateral system.  The right coronary      artery demonstrates a smooth area of about 70-75% narrowing near the      proximal mid junction.  There is minor luminal irregularity beyond this      of about 30% in the mid vessel and about 30-50% narrowing near the      junction of the mid and distal vessel.  The ostium of the PDA has about       70-75% narrowing also.  The posterolateral branch is without critical      disease.   CONCLUSIONS:  1.  Previously stented circumflex coronary artery without significant      restenosis.  2.  Well-preserved left ventricular function.  3.  Progression of disease in the left anterior descending system as noted      above.  4.  Tandem multiple smooth-appearing lesions in the right coronary artery.   DISPOSITION:  I plan to review the films with Dr. Andee Lineman tomorrow.  Final  disposition will be made but she tentatively will be considered for  percutaneous intervention with a non drug-eluting stent.      Arturo Morton. Riley Kill, M.D. [facility]  Electronically Signed     TDS/MEDQ  D:  01/31/2005  T:  01/31/2005  Job:  [id number]   cc:   Ellin Saba., M.D.  [address] [place]  [place] [address]   Learta Codding, M.D. [facility]  [address]  [address]  [place]  [place] [address]   CV Lab

## 2010-10-08 NOTE — Cardiovascular Report (Signed)
NAMEDEANNAH, [name]                 ACCOUNT NO.:  [id number]   MEDICAL RECORD NO.:  [id number]          PATIENT TYPE:  OIB   LOCATION:  6531                         FACILITY:  [facility]   PHYSICIAN:  Arturo Morton. Riley Kill, M.D. [facility] OF BIRTH:  1928-08-04   DATE OF PROCEDURE:  02/04/2005  DATE OF DISCHARGE:                              CARDIAC CATHETERIZATION   INDICATIONS:  Ms. Labombard is a delightful 77 year old woman who has  previously undergone cardiac catheterization; this was done on Monday.  At  that time, she had a patent stent with a high-grade lesion of the left  anterior descending artery.  She also had moderately high-grade disease of  the right coronary artery.  We discussed the possible strategies and it was  my initial recommendation that she undergo percutaneous intervention of the  left anterior descending artery.  There are some limitations related to  this, specifically the patient is on Coumadin.  The LAD after the diagonal  was not amendable to a DES stent.  Finally, she cannot take Plavix on a long-  term basis because of her chronic atrial fibrillation requiring Coumadin.  Based upon these findings, our initial plan, however, was to treat the LAD.  She was brought to the catheterization laboratory after a full discussion of  the risks and benefits.   PROCEDURE:  The patient was brought to the cath lab and prepped and draped  in the usual fashion.  Through an anterior puncture, the left femoral artery  was easily entered.  A 6-French sheath was placed.  The patient was then  given bivalirudin; she had previously received Plavix.  We used a 6-French  JL-3.5 guiding catheter to engage the left main.  With each engagement of  the left main, there was damping.  We were able to get views of the left  main by sitting just outside the left main and the left main is calcified  and slightly more significant than appreciated initially with the 4-French  catheters.  With the  large vascular distribution supplied by both the  circumflex and the LAD, I discussed the case with Dr. Andee Lineman in detail.  Given the left main disease and it is greater severity than previously  appreciated, it was felt that the best approach at this point in time would  be to strongly consider revascularization surgery as the primary approach.  Her femoral sheath was sewn into place and all catheters were removed.  I  have reviewed the case with the patient and also with her daughter.  The  procedure was completed and she was taken to the holding area in  satisfactory clinical condition.   ANGIOGRAPHIC DATA:  The left main is calcified.  There is a calcified shelf  just outside the left main laying over the top of its origin.  The origin  itself is not critical, but it compared to the proximal LAD probably  represents about 60% reduction in lumen diameter with an MLD probably in the  range of about 1.6 to 1.8.  The left anterior descending artery itself is  unchanged from the  previous study with tandem lesions of 70% and 90%,  followed by diffuse LAD disease.  There is also involvement of 60% and 50%  involvement of the diagonal.  There is probably 60% eccentric proximal  narrowing of the circumflex leading into the stent, which is widely patent.   As previously noted, the right coronary has significant disease and is noted  on the previous catheterization study from January 31, 2005.   DISPOSITION:  1.  The patient's sheath will be removed.  2.  CVTS consult will be obtained.      Arturo Morton. Riley Kill, M.D. [facility]  Electronically Signed     TDS/MEDQ  D:  02/04/2005  T:  02/05/2005  Job:  [id number]

## 2010-10-08 NOTE — Discharge Summary (Signed)
NAME:  [name], [name]                           ACCOUNT NO.:  [id number]   MEDICAL RECORD NO.:  [id number]                   PATIENT TYPE:  INP   LOCATION:  3709                                 FACILITY:  [facility]   PHYSICIAN:  Veneda Melter, M.D. [facility]               DATE OF BIRTH:  10/03/1928   DATE OF ADMISSION:  02/09/2002  DATE OF DISCHARGE:  02/11/2002                                 DISCHARGE SUMMARY   DISCHARGE DIAGNOSIS:  1. Atrial fibrillation, rapid ventricular response.  2. Coronary artery disease status post acute myocardial infarction February     2003.  3. Hypertension.  4. Hyperlipidemia.  5. Osteoarthritis.   HOSPITAL COURSE:  The patient is a 77 year old female who had an acute MI in  February 2003 followed by emergent angioplasty.  On the day of admission,  the patient awoke and felt her heart racing with palpitations.  This was  accompanied by sharp chest pain. She presented to [facility]  Emergency Room and was found to be in atrial fibrillation with a rapid  ventricular response in the 120s.   The next day, the patient was seen by Dr. Veneda Melter.  She had no further  pain and remained in sinus bradycardia at 55.  He also noted serial cardiac  enzymes were negative.   On 02/11/2002, the patient was seen by Dr. Andee Lineman.  She had no chest pain or  shortness of breath and was maintaining sinus rhythm.  He noted the patient  had been started on Cardizem; however, he discontinued this.  Her labetalol  was changed to Toprol XL 100 q.d. and digoxin 0.25 mg added.  Later that  day, the patient underwent an adenosine Cardiolite stress test.  Nuclear  imaging revealed no ischemia, but there were signs of lateral and  inferolateral infarction.  The ejection fraction was 63%.  As a result, Dr.  Andee Lineman felt the patient was stable for discharge.   DISCHARGE MEDICATIONS:  1. Zocor 20 mg q.h.s.  2. Toprol XL 100 mg q.d.  3. Digoxin 0.25 mg q.d.  4. Coumadin Monday,  Wednesday, and Friday at 5 mg, otherwise 2.5 mg.  5. Premarin as previous taken.  6. Medroxyprogesterone as previously taken.   LABORATORY DATA:  White count 6.7, hemoglobin 13.0, hematocrit 38, platelets  230.  INR 2.4.  Sodium 140, potassium3.5, chloride 107, CO2 28, BUN 13,  creatinine 0.6, glucose 102.  Calcium 9.1, BNP 90.3. Serum iron studies as  well as a C-reactive protein were pending at time of discharge.   DISCHARGE INSTRUCTIONS:   ACTIVITY:  The patient is to slowly return to her normal level of activity.   DIET:  She is to follow a low-fat, low-cholesterol diet.    FOLLOW UP:  1. She is to follow up with the [facility]madin [facility] at scheduled.  2. She is to follow up with Dr. Andee Lineman in  approximately three weeks; the     office will call.  3. She is to be set up for a Holter monitor, and again the office will call.     Annett Fabian, P.A. [facility]                  Veneda Melter, M.D. [facility]    CKM/MEDQ  D:  02/11/2002  T:  02/14/2002  Job:  [id number]   cc:   Sharlot Gowda, M.D.  [address]  [place], [place] [address]  Fax: [phone]   Learta Codding, M.D. [facility]

## 2010-10-08 NOTE — H&P (Signed)
NAME:  [name], [name]                           ACCOUNT NO.:  [id number]   MEDICAL RECORD NO.:  [id number]                   PATIENT TYPE:  INP   LOCATION:  3709                                 FACILITY:  [facility]   PHYSICIAN:  Veneda Melter, M.D. [facility]               DATE OF BIRTH:  1928-07-20   DATE OF ADMISSION:  02/09/2002  DATE OF DISCHARGE:                                HISTORY & PHYSICAL   CHIEF COMPLAINT:  Palpitations.   HISTORY:  The patient is a 77 year-old white female with a history of  coronary artery disease who suffered a posterior wall myocardial infarction  on June 30, 2001 treated with urgent percutaneous intervention. Her  hospitalization was complicated by a brief episode of atrial fibrillation.  She was subsequently Coumadin after a Plavix course was completed and she  has done well in the interim.  She has not had any recurrence of chest pain,  shortness of breath or palpitations until the day of presentation. She had  noted her heart racing and a discomfort under her left axilla.  She did feel  somewhat lightheaded and had some blurred vision and presented to the  emergency room where she was found to be in atrial fibrillation with rapid  ventricular rate.  Subsequent rate control was initiated and the patient  converted to normal sinus rhythm and has had no further chest pain.  She has  not had any shortness of breath, orthopnea or paroxysmal nocturnal dyspnea.   REVIEW OF SYMPTOMS:  Otherwise noncontributory.Marland Kitchen   PAST MEDICAL HISTORY:  1. Coronary artery disease status post percutaneous intervention of the left     circumflex artery on June 30, 2001 by Dr. Juanda Chance with reduction of     100% narrowing to 0% with moderate disease in the RCA and LAD and well     preserved left ventricular function.  2. Status post hysterectomy.  3. Paroxysmal atrial fibrillation.  4. Dyslipidemia.  5. Osteoarthritis.   ALLERGIES:  AMITRIPTYLINE and ASPIRIN.   CURRENT  MEDICATIONS:  1. Coumadin as directed.  2. Labetalol 100 mg b.i.d.  3. Zocor 20 mg q.d.  4. Medroxyprogesterone days 16 through 25.  5. Premarin on days one through 25.   SOCIAL HISTORY:  The patient lives in [place] with her daughter.  She  denies alcohol or tobacco use.   FAMILY HISTORY:  Noncontributory.   PHYSICAL EXAMINATION:  GENERAL:  This is a well developed, well nourished  white female in no acute distress.  VITAL SIGNS:  Temperature 98.0, pulse 60, respirations 16, blood pressure  130/80, 02 saturation is 98% on room air.  HEENT: Pupils equal, round and reactive to light. Extraocular muscles are  intact.  Oropharynx shows no lesions. The neck is supple.  CARDIAC EXAM:  Irregular rate with no bruits.  LUNGS:  Clear to auscultation.  ABDOMEN:  Soft and non-tender.  EXTREMITIES: No  edema. Peripheral pulses are palpable and equal.  Motor  strength is 5/5.  Sensory is intact to touch.   Chest x-ray shows no acute infiltrates or effusions, changes of obstructive  lung disease as noted.   EKG on admission was atrial fibrillation at 122.  After resolution of  fibrillation she had normal sinus rhythm with upper R wave and T wave in V1  consistent with old posterior infarct.  No acute ischemic change.   LABORATORY DATA:  White count 6.7, hemoglobin 12.1, hematocrit 35.5,  platelets 230,000. Sodium 139, potassium 3.4, chloride 106, bicarb 26, BUN  16, creatinine 0.7, glucose 106.  PT is 25.5, INR is 2.8, PTT 41.0.  Cardiac  enzymes are negative for injury.   ASSESSMENT AND PLAN:  The patient is a 77 year-old female with known history  of coronary artery disease and paroxysmal atrial fibrillation in the setting  of a myocardial infarction who presents with recurrence of atrial  fibrillation.  The patient was mildly symptomatic with this with  lightheadedness, blurred vision and left sided chest discomfort. This pain  was different than her prior cardiac pain and thus is not  clearly ischemic  in origin.  She also has no evidence of acute ischemia or injury by enzymes  or electrocardiogram.  We will continue medical therapy for the patient's  atrial fibrillation and consider further assessment.  Rule out advanced  coronary artery disease with either stress imaging study or repeat angiogram  based on the patient's clinical course.                                               Veneda Melter, M.D. [facility]    NG/MEDQ  D:  02/10/2002  T:  02/12/2002  Job:  [id number]   cc:   Sharlot Gowda, M.D.  [address] [place], [place] [address]  Fax: [phone]

## 2010-10-11 ENCOUNTER — Telehealth: Payer: Self-pay | Admitting: Family Medicine

## 2010-10-11 NOTE — Telephone Encounter (Signed)
Pt wants to be switched back to  Make sure this goes in chart

## 2010-10-12 ENCOUNTER — Other Ambulatory Visit: Payer: Self-pay | Admitting: Internal Medicine

## 2010-10-14 ENCOUNTER — Other Ambulatory Visit: Payer: Self-pay

## 2010-10-14 NOTE — Telephone Encounter (Signed)
Pt called and said she couldn't take the new flovent Dr.Lalonde had put her on wanted to go back to the and wanted protonix 40 mg 1 bid so I called them in to [facility] [phone]

## 2010-10-19 ENCOUNTER — Telehealth: Payer: Self-pay | Admitting: Family Medicine

## 2010-10-19 ENCOUNTER — Telehealth: Payer: Self-pay

## 2010-10-19 NOTE — Telephone Encounter (Signed)
She needs an appointment for reck and call anything in for her bronchitis

## 2010-10-19 NOTE — Telephone Encounter (Signed)
Called pt she is comeing in June 7 for follow up an dI will call her an antibioctic in per [employer]

## 2010-10-19 NOTE — Telephone Encounter (Signed)
Dr.Lalonde And I had miss co

## 2010-10-21 ENCOUNTER — Encounter: Payer: Self-pay | Admitting: Family Medicine

## 2010-10-22 ENCOUNTER — Encounter: Payer: Self-pay | Admitting: Family Medicine

## 2010-10-22 ENCOUNTER — Ambulatory Visit (INDEPENDENT_AMBULATORY_CARE_PROVIDER_SITE_OTHER): Payer: Medicare Other | Admitting: Family Medicine

## 2010-10-22 ENCOUNTER — Telehealth: Payer: Self-pay | Admitting: Family Medicine

## 2010-10-22 VITALS — BP 110/60 | HR 72 | Temp 98.0°F | Ht 65.0 in | Wt 144.0 lb

## 2010-10-22 DIAGNOSIS — J40 Bronchitis, not specified as acute or chronic: Secondary | ICD-10-CM

## 2010-10-22 MED ORDER — AMOXICILLIN 875 MG PO TABS: 875.0000 mg | ORAL_TABLET | Freq: Two times a day (BID) | ORAL | Status: AC

## 2010-10-22 MED ORDER — CHLORPHENIRAMINE-HYDROCODONE 8-10 MG/5ML PO LQCR: 5.0000 mL | Freq: Two times a day (BID) | ORAL | Status: AC | PRN

## 2010-10-22 NOTE — Patient Instructions (Signed)
take the antibiotic and use the Tussionex and if you have any further problems give me a call

## 2010-10-22 NOTE — Progress Notes (Signed)
  Subjective:    Patient ID: [name], female    DOB: [birth year], 77 y.o.   MRN: [id number]  HPI approximately 10 days ago she had the onset of a cough, slight sore throat and low-grade fever. The cough became productive a couple of days later. She is better but still having a productive cough. She continues on medications listed in the chart.    Review of Systems     Objective:   Physical Examalert and in no distress. Tympanic membranes and canals are normal. Throat is clear. Tonsils are normal. Neck is supple without adenopathy or thyromegaly. Cardiac exam shows a regular sinus rhythm without murmurs or gallops. Lungs are clear to auscultation.         Assessment & Plan:  Bronchitis Will place her on Amoxil. And give her Tussionex

## 2010-10-22 NOTE — Telephone Encounter (Signed)
DONE

## 2010-10-28 ENCOUNTER — Other Ambulatory Visit: Payer: Self-pay | Admitting: *Deleted

## 2010-10-28 ENCOUNTER — Ambulatory Visit: Payer: Medicare Other | Admitting: Family Medicine

## 2010-10-28 ENCOUNTER — Other Ambulatory Visit: Payer: Self-pay | Admitting: Family Medicine

## 2010-10-29 ENCOUNTER — Other Ambulatory Visit: Payer: Self-pay

## 2010-10-29 MED ORDER — ROSUVASTATIN CALCIUM 40 MG PO TABS: 40.0000 mg | ORAL_TABLET | Freq: Every day | ORAL | Status: DC

## 2010-11-11 ENCOUNTER — Other Ambulatory Visit: Payer: Self-pay | Admitting: Family Medicine

## 2010-11-11 ENCOUNTER — Other Ambulatory Visit: Payer: Self-pay | Admitting: Internal Medicine

## 2010-11-26 ENCOUNTER — Ambulatory Visit: Payer: Self-pay | Admitting: Adult Health

## 2010-12-10 ENCOUNTER — Other Ambulatory Visit: Payer: Self-pay | Admitting: *Deleted

## 2010-12-10 MED ORDER — CLOPIDOGREL BISULFATE 75 MG PO TABS: 75.0000 mg | ORAL_TABLET | Freq: Every day | ORAL | Status: DC

## 2010-12-23 ENCOUNTER — Other Ambulatory Visit: Payer: Self-pay | Admitting: Internal Medicine

## 2011-01-16 ENCOUNTER — Other Ambulatory Visit: Payer: Self-pay | Admitting: Family Medicine

## 2011-01-17 ENCOUNTER — Other Ambulatory Visit: Payer: Self-pay

## 2011-01-17 NOTE — Telephone Encounter (Signed)
Called in valium

## 2011-01-17 NOTE — Telephone Encounter (Signed)
Is this ok?

## 2011-02-18 LAB — CROSSMATCH
ABO/RH(D): A POS
Antibody Screen: NEGATIVE

## 2011-03-10 ENCOUNTER — Other Ambulatory Visit: Payer: Self-pay | Admitting: Adult Health

## 2011-03-11 NOTE — Telephone Encounter (Signed)
Need to contact Dr Jola Babinski office for refills

## 2011-03-14 ENCOUNTER — Telehealth: Payer: Self-pay | Admitting: Pulmonary Disease

## 2011-03-14 ENCOUNTER — Encounter: Payer: Self-pay | Admitting: Family Medicine

## 2011-03-14 MED ORDER — ALBUTEROL SULFATE HFA 108 (90 BASE) MCG/ACT IN AERS: 2.0000 | INHALATION_SPRAY | Freq: Four times a day (QID) | RESPIRATORY_TRACT | Status: DC | PRN

## 2011-03-14 NOTE — Telephone Encounter (Signed)
Called and spoke with pt.  Pt requesting refill on Ventolin.  Pt last saw RX on 05/28/10 and has pending appt scheduled for 04/28/11.  Rx sent to pharmacy.  Pt awrae.

## 2011-03-15 ENCOUNTER — Other Ambulatory Visit: Payer: Medicare Other

## 2011-03-15 DIAGNOSIS — Z23 Encounter for immunization: Secondary | ICD-10-CM

## 2011-03-15 DIAGNOSIS — Z79899 Other long term (current) drug therapy: Secondary | ICD-10-CM

## 2011-03-15 LAB — HEMOGLOBIN: Hemoglobin: 11.7 g/dL — ABNORMAL LOW (ref 12.0–15.0)

## 2011-04-09 ENCOUNTER — Other Ambulatory Visit: Payer: Self-pay | Admitting: Family Medicine

## 2011-04-28 ENCOUNTER — Ambulatory Visit (INDEPENDENT_AMBULATORY_CARE_PROVIDER_SITE_OTHER): Payer: Medicare Other | Admitting: Pulmonary Disease

## 2011-04-28 ENCOUNTER — Encounter: Payer: Self-pay | Admitting: Pulmonary Disease

## 2011-04-28 DIAGNOSIS — R059 Cough, unspecified: Secondary | ICD-10-CM

## 2011-04-28 DIAGNOSIS — R05 Cough: Secondary | ICD-10-CM

## 2011-04-28 DIAGNOSIS — J45909 Unspecified asthma, uncomplicated: Secondary | ICD-10-CM

## 2011-04-28 NOTE — Patient Instructions (Signed)
Refills on benzonatate & VENTOLIN will be sent

## 2011-04-28 NOTE — Progress Notes (Signed)
  Subjective:    Patient ID: [name], female    DOB: 1929/03/01, 77 y.o.   MRN: [id number]  HPI  82/F, never smoker with kyphoscoliosis & chronic productive cough since 2003.  She has focal encephalomalacia on MRI PFTs in March 2004 have shown significant improvement in FEV1 with bronchodilator, hence maintained on inhaled steroids. She also has hiatal hernia with severe GERD symptoms and takes PPI twice a day with some breakthrough episodes of reflux. She is treated as mild perssitent asthma , Spirometry 5/09 >> shows nml lung function. Did not tolerate advair. Off singulair - body aches, on claritin instead  She describes an occasional episode of strangling in her throat, lasting a few minutes, that has been ongoing for many years, note negative ENT exam. An ACE inhibitor was discontinued in April 2008.  Her dyspnea is felt to be multifactorial - kyphoscoliosis, cadriac issues & mild asthma + deconditioning  Her chronic cough is also multifactorial - hiatal hernia/ GERD, mild asthma , sinus drainage.  CT chest in '05 & morerecently CT neck in 2011 have shown biapical parenchymal scarring   04/28/2011  c/o productive cough with clear/green/yellow mucus, takes mucinex all the time, tessalon helped some Denies chest pain, orthopnea, hemoptysis, fever, n/v/d, edema, headache.    Review of Systems     Objective:   Physical Exam        Assessment & Plan:

## 2011-05-02 ENCOUNTER — Telehealth: Payer: Self-pay | Admitting: Pulmonary Disease

## 2011-05-02 MED ORDER — ALBUTEROL SULFATE HFA 108 (90 BASE) MCG/ACT IN AERS: 2.0000 | INHALATION_SPRAY | Freq: Four times a day (QID) | RESPIRATORY_TRACT | Status: DC | PRN

## 2011-05-02 MED ORDER — BENZONATATE 100 MG PO CAPS: ORAL_CAPSULE | ORAL | Status: DC

## 2011-05-02 NOTE — Telephone Encounter (Signed)
RX's have been called in since this was not done on 04/28/11 and ra gave the okay. Pt is aware.

## 2011-05-07 NOTE — Assessment & Plan Note (Signed)
Ct flovent No exacerbations for last 2-3 years

## 2011-05-07 NOTE — Assessment & Plan Note (Signed)
OK for  mucinex as needed Tessalon prn

## 2011-05-09 ENCOUNTER — Other Ambulatory Visit: Payer: Self-pay | Admitting: Family Medicine

## 2011-05-26 ENCOUNTER — Other Ambulatory Visit: Payer: Self-pay | Admitting: Family Medicine

## 2011-06-09 ENCOUNTER — Other Ambulatory Visit: Payer: Self-pay | Admitting: Family Medicine

## 2011-06-18 ENCOUNTER — Other Ambulatory Visit: Payer: Self-pay | Admitting: Internal Medicine

## 2011-06-21 ENCOUNTER — Other Ambulatory Visit: Payer: Self-pay | Admitting: Adult Health

## 2011-06-24 ENCOUNTER — Other Ambulatory Visit: Payer: Self-pay | Admitting: Family Medicine

## 2011-06-24 NOTE — Telephone Encounter (Signed)
Patient needs to schedule an office visit. CLS

## 2011-07-11 ENCOUNTER — Other Ambulatory Visit: Payer: Self-pay | Admitting: Family Medicine

## 2011-07-11 ENCOUNTER — Other Ambulatory Visit: Payer: Self-pay | Admitting: Cardiology

## 2011-07-21 ENCOUNTER — Ambulatory Visit (INDEPENDENT_AMBULATORY_CARE_PROVIDER_SITE_OTHER): Payer: Medicare Other | Admitting: Cardiovascular Disease

## 2011-07-21 ENCOUNTER — Encounter: Payer: Self-pay | Admitting: Cardiovascular Disease

## 2011-07-21 VITALS — BP 134/66 | HR 70 | Ht 65.0 in | Wt 140.0 lb

## 2011-07-21 DIAGNOSIS — I2510 Atherosclerotic heart disease of native coronary artery without angina pectoris: Secondary | ICD-10-CM | POA: Diagnosis not present

## 2011-07-21 DIAGNOSIS — I059 Rheumatic mitral valve disease, unspecified: Secondary | ICD-10-CM

## 2011-07-21 DIAGNOSIS — Z951 Presence of aortocoronary bypass graft: Secondary | ICD-10-CM | POA: Insufficient documentation

## 2011-07-21 DIAGNOSIS — I779 Disorder of arteries and arterioles, unspecified: Secondary | ICD-10-CM | POA: Diagnosis not present

## 2011-07-21 DIAGNOSIS — I340 Nonrheumatic mitral (valve) insufficiency: Secondary | ICD-10-CM

## 2011-07-21 MED ORDER — ISOSORBIDE DINITRATE 30 MG PO TABS: 30.0000 mg | ORAL_TABLET | Freq: Two times a day (BID) | ORAL | Status: DC

## 2011-07-21 MED ORDER — DILTIAZEM HCL ER COATED BEADS 120 MG PO CP24: 120.0000 mg | ORAL_CAPSULE | Freq: Every day | ORAL | Status: DC

## 2011-07-21 MED ORDER — VALSARTAN 40 MG PO TABS: 40.0000 mg | ORAL_TABLET | Freq: Every day | ORAL | Status: DC

## 2011-07-21 MED ORDER — CLOPIDOGREL BISULFATE 75 MG PO TABS: 75.0000 mg | ORAL_TABLET | Freq: Every day | ORAL | Status: DC

## 2011-07-21 NOTE — Assessment & Plan Note (Signed)
Will repeat carotid artery dopplers.

## 2011-07-21 NOTE — Assessment & Plan Note (Signed)
Stable. Continue medical therapy 

## 2011-07-21 NOTE — Patient Instructions (Signed)
Your physician wants you to follow-up in:  6 months. You will receive a reminder letter in the mail two months in advance. If you don't receive a letter, please call our office to schedule the follow-up appointment.  Your physician has requested that you have a carotid duplex. This test is an ultrasound of the carotid arteries in your neck. It looks at blood flow through these arteries that supply the brain with blood. Allow one hour for this exam. There are no restrictions or special instructions.     Your physician has requested that you have an echocardiogram. Echocardiography is a painless test that uses sound waves to create images of your heart. It provides your doctor with information about the size and shape of your heart and how well your heart's chambers and valves are working. This procedure takes approximately one hour. There are no restrictions for this procedure.   

## 2011-07-21 NOTE — Assessment & Plan Note (Signed)
Will repeat echo

## 2011-07-21 NOTE — Progress Notes (Signed)
History of Present Illness: 78 yo female with history of CAD status post bypass surgery in September 2006 (Dr. Donata Clay)  as well as a Cox-Maze procedure for atrial fibrillation,  CVA, carotid artery stenosis s/p R CEA, fibromyalgia, GERD, hiatal hernia, severe kyphoscoloisis, moderate MR, mild AS here today for cardiac follow up. She has been followed in the past by Dr. Gala Romney. Last Echo 8/10: Echo EF 50-55% with mild AS (mean gradient 9) and moderate MR  Myoview 8/10: EF 70% small lateral infarct no ischemia.   She is here today for follow up. She tells me that she has been having chest pains with radiation to her back that occurs once per week. She uses NTG and this helps. She has no change in her breathing.   Primary Care Physician: Sharlot Gowda   Past Medical History  Diagnosis Date  . Arthritis   . Asthma   . GERD (gastroesophageal reflux disease)   . Hyperlipidemia   . Myocardial infarction   . Allergy   . Hypertension   . Eczema   . ASHD (arteriosclerotic heart disease)   . CAD (coronary artery disease) 06/2001    Past Surgical History  Procedure Date  . Abdominal hysterectomy 1976  . Carotid endarterectomy 2010  . Coronary artery bypass graft 01/2002  . Tonsillectomy     Current Outpatient Prescriptions  Medication Sig Dispense Refill  . benzocaine (ORAJEL) 10 % mucosal gel Use as directed in the mouth or throat as needed.      . benzonatate (TESSALON) 100 MG capsule Take 1 capsule by mouth 3 times a day as needed for cough  45 capsule  0  . chlorpheniramine-hydrocodone (TUSSIONEX) 8-10 MG/5ML suspension Take 5 mLs by mouth every 12 (twelve) hours as needed for cough.  60 mL  0  . clopidogrel (PLAVIX) 75 MG tablet take 1 tablet by mouth once daily  30 tablet  6  . CRESTOR 40 MG tablet take 1 tablet by mouth once daily  30 tablet  0  . diazepam (VALIUM) 2 MG tablet        . diltiazem (CARDIZEM CD) 120 MG 24 hr capsule take 1 capsule by mouth once daily  30  capsule  6  . DIOVAN 40 MG tablet take 1 tablet by mouth once daily  30 tablet  2  . fluticasone (FLOVENT HFA) 220 MCG/ACT inhaler Inhale 1 puff into the lungs 2 (two) times daily.  1 Inhaler  12  . guaiFENesin (MUCINEX) 600 MG 12 hr tablet Take 600 mg by mouth 2 (two) times daily.        . isosorbide dinitrate (ISORDIL) 30 MG tablet TAKE 1 TABLET BY MOUTH TWICE A DAY  60 tablet  10  . loratadine (CLARITIN) 10 MG tablet Take 10 mg by mouth daily.        . metoprolol succinate (TOPROL-XL) 25 MG 24 hr tablet take 1 tablet by mouth once daily  30 tablet  0  . NASONEX 50 MCG/ACT nasal spray instill 1 spray into each nostril once daily  17 g  0  . NITROSTAT 0.4 MG SL tablet PLACE 1 TABLET UNDER THE TONGUE IF NEEDED EVERY 5 MINUTES FOR CHEST PAIN FOR 3 DOSES  50 tablet  11  . pantoprazole (PROTONIX) 40 MG tablet Take 40 mg by mouth 2 (two) times daily.       . Pseudoephedrine-DM-GG (ROBITUSSIN CF PO) Take by mouth. As needed -pt usually takes every day      .  VENTOLIN HFA 108 (90 BASE) MCG/ACT inhaler INHALE 1-2 PUFFS EVERY 4-6 HOURS AS NEEDED  18 g  2    Allergies  Allergen Reactions  . Amitriptyline Hcl   . Aspirin   . Zetia (Ezetimibe)     History   Social History  . Marital Status: Widowed    Spouse Name: N/A    Number of Children: 3  . Years of Education: N/A   Occupational History  .     Social History Main Topics  . Smoking status: Never Smoker   . Smokeless tobacco: Former Neurosurgeon    Types: Snuff    Quit date: 05/23/2001  . Alcohol Use: No  . Drug Use: No  . Sexually Active: Not Currently   Other Topics Concern  . Not on file   Social History Narrative  . No narrative on file    Family History  Problem Relation Age of Onset  . Heart failure Mother     Review of Systems:  As stated in the HPI and otherwise negative.   BP 134/66  Pulse 70  Ht 5\' 5"  (1.651 m)  Wt 140 lb (63.504 kg)  BMI 23.30 kg/m2  Physical Examination: General: Well developed, well  nourished, NAD HEENT: OP clear, mucus membranes moist SKIN: warm, dry. No rashes. Neuro: No focal deficits Musculoskeletal: Muscle strength 5/5 all ext Psychiatric: Mood and affect normal Neck: No JVD, no carotid bruits, no thyromegaly, no lymphadenopathy. Lungs:Clear bilaterally, no wheezes, rhonci, crackles Cardiovascular: Regular rate and rhythm. No murmurs, gallops or rubs. Abdomen:Soft. Bowel sounds present. Non-tender.  Extremities: No lower extremity edema. Pulses are 2 + in the bilateral DP/PT.  EKG: NSR with 1st degree AV block.

## 2011-07-24 ENCOUNTER — Other Ambulatory Visit: Payer: Self-pay | Admitting: Family Medicine

## 2011-08-09 ENCOUNTER — Other Ambulatory Visit: Payer: Self-pay | Admitting: Family Medicine

## 2011-08-09 ENCOUNTER — Other Ambulatory Visit: Payer: Self-pay | Admitting: *Deleted

## 2011-08-09 DIAGNOSIS — I779 Disorder of arteries and arterioles, unspecified: Secondary | ICD-10-CM

## 2011-08-17 ENCOUNTER — Other Ambulatory Visit: Payer: Self-pay

## 2011-08-17 ENCOUNTER — Ambulatory Visit (HOSPITAL_COMMUNITY): Payer: Medicare Other | Attending: Cardiology

## 2011-08-17 DIAGNOSIS — I059 Rheumatic mitral valve disease, unspecified: Secondary | ICD-10-CM

## 2011-08-17 DIAGNOSIS — I4891 Unspecified atrial fibrillation: Secondary | ICD-10-CM | POA: Diagnosis not present

## 2011-08-17 DIAGNOSIS — I359 Nonrheumatic aortic valve disorder, unspecified: Secondary | ICD-10-CM

## 2011-08-17 DIAGNOSIS — I779 Disorder of arteries and arterioles, unspecified: Secondary | ICD-10-CM

## 2011-08-17 DIAGNOSIS — M4120 Other idiopathic scoliosis, site unspecified: Secondary | ICD-10-CM | POA: Insufficient documentation

## 2011-08-17 DIAGNOSIS — I252 Old myocardial infarction: Secondary | ICD-10-CM | POA: Insufficient documentation

## 2011-08-17 DIAGNOSIS — I2510 Atherosclerotic heart disease of native coronary artery without angina pectoris: Secondary | ICD-10-CM

## 2011-08-17 DIAGNOSIS — I340 Nonrheumatic mitral (valve) insufficiency: Secondary | ICD-10-CM

## 2011-08-18 ENCOUNTER — Other Ambulatory Visit (HOSPITAL_COMMUNITY): Payer: Medicare Other

## 2011-08-22 ENCOUNTER — Encounter: Payer: Self-pay | Admitting: Cardiovascular Disease

## 2011-08-22 NOTE — Telephone Encounter (Signed)
error 

## 2011-08-26 ENCOUNTER — Other Ambulatory Visit: Payer: Self-pay | Admitting: Family Medicine

## 2011-08-31 ENCOUNTER — Other Ambulatory Visit: Payer: Self-pay | Admitting: *Deleted

## 2011-08-31 DIAGNOSIS — I6529 Occlusion and stenosis of unspecified carotid artery: Secondary | ICD-10-CM

## 2011-09-02 ENCOUNTER — Telehealth: Payer: Self-pay | Admitting: Cardiovascular Disease

## 2011-09-02 ENCOUNTER — Encounter (INDEPENDENT_AMBULATORY_CARE_PROVIDER_SITE_OTHER): Payer: Medicare Other

## 2011-09-02 DIAGNOSIS — I6529 Occlusion and stenosis of unspecified carotid artery: Secondary | ICD-10-CM

## 2011-09-02 NOTE — Telephone Encounter (Signed)
Pt Signed ROI, she Picked Up Copy of Echo 09/02/11/KM

## 2011-09-26 ENCOUNTER — Other Ambulatory Visit: Payer: Self-pay | Admitting: Family Medicine

## 2011-10-27 ENCOUNTER — Other Ambulatory Visit: Payer: Self-pay | Admitting: Family Medicine

## 2011-10-27 NOTE — Telephone Encounter (Signed)
THIS IS THE LAST REFILL. PATIENT NEEDS A OFFICE VISIT IN ORDER TO RECEIVE ANYMORE REFILLS.

## 2011-11-04 ENCOUNTER — Ambulatory Visit: Payer: Medicare Other | Admitting: Adult Health

## 2011-11-07 ENCOUNTER — Other Ambulatory Visit: Payer: Self-pay | Admitting: Family Medicine

## 2011-11-12 ENCOUNTER — Encounter (HOSPITAL_COMMUNITY): Payer: Self-pay | Admitting: Emergency Medicine

## 2011-11-12 ENCOUNTER — Emergency Department (HOSPITAL_COMMUNITY): Payer: Medicare Other

## 2011-11-12 ENCOUNTER — Emergency Department (HOSPITAL_COMMUNITY)
Admission: EM | Admit: 2011-11-12 | Discharge: 2011-11-13 | Disposition: A | Discharge status: Home / Self Care | Payer: Medicare Other | Attending: Emergency Medicine | Admitting: Emergency Medicine

## 2011-11-12 DIAGNOSIS — M129 Arthropathy, unspecified: Secondary | ICD-10-CM | POA: Insufficient documentation

## 2011-11-12 DIAGNOSIS — I209 Angina pectoris, unspecified: Secondary | ICD-10-CM | POA: Diagnosis not present

## 2011-11-12 DIAGNOSIS — E785 Hyperlipidemia, unspecified: Secondary | ICD-10-CM | POA: Insufficient documentation

## 2011-11-12 DIAGNOSIS — R079 Chest pain, unspecified: Secondary | ICD-10-CM | POA: Diagnosis not present

## 2011-11-12 DIAGNOSIS — I2510 Atherosclerotic heart disease of native coronary artery without angina pectoris: Secondary | ICD-10-CM | POA: Insufficient documentation

## 2011-11-12 DIAGNOSIS — I10 Essential (primary) hypertension: Secondary | ICD-10-CM | POA: Diagnosis not present

## 2011-11-12 DIAGNOSIS — K219 Gastro-esophageal reflux disease without esophagitis: Secondary | ICD-10-CM | POA: Insufficient documentation

## 2011-11-12 DIAGNOSIS — I252 Old myocardial infarction: Secondary | ICD-10-CM | POA: Diagnosis not present

## 2011-11-12 DIAGNOSIS — R0789 Other chest pain: Secondary | ICD-10-CM | POA: Diagnosis not present

## 2011-11-12 DIAGNOSIS — J449 Chronic obstructive pulmonary disease, unspecified: Secondary | ICD-10-CM | POA: Diagnosis not present

## 2011-11-12 HISTORY — DX: Bronchitis, not specified as acute or chronic: J40

## 2011-11-12 LAB — CBC
Hemoglobin: 11.9 g/dL — ABNORMAL LOW (ref 12.0–15.0)
MCH: 30.8 pg (ref 26.0–34.0)
RBC: 3.86 MIL/uL — ABNORMAL LOW (ref 3.87–5.11)
WBC: 7.2 10*3/uL (ref 4.0–10.5)

## 2011-11-12 LAB — DIFFERENTIAL
Eosinophils Absolute: 0.1 10*3/uL (ref 0.0–0.7)
Lymphocytes Relative: 24 % (ref 12–46)
Lymphs Abs: 1.7 10*3/uL (ref 0.7–4.0)
Monocytes Relative: 8 % (ref 3–12)
Neutrophils Relative %: 67 % (ref 43–77)

## 2011-11-12 LAB — BASIC METABOLIC PANEL
BUN: 15 mg/dL (ref 6–23)
CO2: 25 mEq/L (ref 19–32)
Chloride: 106 mEq/L (ref 96–112)
GFR calc non Af Amer: >90 mL/min (ref >90)
Glucose, Bld: 105 mg/dL — ABNORMAL HIGH (ref 70–99)
Potassium: 3.6 mEq/L (ref 3.5–5.1)

## 2011-11-12 LAB — POCT I-STAT TROPONIN I: Troponin i, poc: 0 ng/mL (ref 0.00–0.08)

## 2011-11-12 MED ORDER — DIPHENHYDRAMINE HCL 50 MG/ML IJ SOLN
INTRAMUSCULAR | Status: AC
  Filled 2011-11-12: qty 1

## 2011-11-12 NOTE — ED Notes (Signed)
Pt reports pulled a lazy boy recliner now having lower chest pain going across with nausea.

## 2011-11-12 NOTE — ED Provider Notes (Signed)
History     CSN: [id number]  Arrival date & time 11/12/11  1633   First MD Initiated Contact with Patient 11/12/11 1842      Chief Complaint  Patient presents with  . Chest Pain   [facility] [place]  (Consider location/radiation/quality/duration/timing/severity/associated sxs/prior treatment) HPI This 78 year old female has known coronary artery disease with a stable anginal pattern approximately once per week of chest discomfort relieved with nitroglycerin. Today she overexerted herself. She pushed a La-Z-Boy recliner and after she was done pushing a recliner had a spell for about half an hour to 45 minutes of her typical chest discomfort which lasted longer than usual. She also had some mild nausea. She is no vomiting no sweats no shortness of breath no cough no back pain.  Her discomfort was in the lower chest and upper abdominal area. It did radiate a little bit towards her left upper back. She had no lateralizing or focal weakness or numbness. She is asymptomatic now and does not want to stay in the hospital tonight. She wants to be discharged soon as possible. There was no treatment prior to arrival her discomfort resolves spontaneously. Past Medical History  Diagnosis Date  . Arthritis   . Asthma   . GERD (gastroesophageal reflux disease)   . Hyperlipidemia   . Myocardial infarction   . Allergy   . Hypertension   . Eczema   . ASHD (arteriosclerotic heart disease)   . CAD (coronary artery disease) 06/2001  . Bronchitis     Past Surgical History  Procedure Date  . Abdominal hysterectomy 1976  . Carotid endarterectomy 2010  . Coronary artery bypass graft 01/2002  . Tonsillectomy   . Cardiac surgery stents  . Tumor removal     Family History  Problem Relation Age of Onset  . Heart failure Mother     History  Substance Use Topics  . Smoking status: Never Smoker   . Smokeless tobacco: Former Neurosurgeon    Types: Snuff    Quit date: 05/23/2001  . Alcohol Use: No    OB  History    Grav Para Term Preterm Abortions TAB SAB Ect Mult Living                  Review of Systems  Constitutional: Negative for fever.       10 Systems reviewed and are negative for acute change except as noted in the HPI.  HENT: Negative for congestion.   Eyes: Negative for discharge and redness.  Respiratory: Negative for cough and shortness of breath.   Cardiovascular: Positive for chest pain.  Gastrointestinal: Positive for nausea and abdominal pain. Negative for vomiting.  Musculoskeletal: Negative for back pain.  Skin: Negative for rash.  Neurological: Negative for syncope, numbness and headaches.  Psychiatric/Behavioral:       No behavior change.    Allergies  Amitriptyline hcl; Aspirin; and Zetia  Home Medications   Current Outpatient Rx  Name Route Sig Dispense Refill  . ALBUTEROL SULFATE HFA 108 (90 BASE) MCG/ACT IN AERS Inhalation Inhale 2 puffs into the lungs every 4 (four) hours as needed. For shortness of breath    . BENZONATATE 100 MG PO CAPS Oral Take 100 mg by mouth 3 (three) times daily as needed. For cough    . CLOPIDOGREL BISULFATE 75 MG PO TABS Oral Take 75 mg by mouth daily.    Marland Kitchen DIAZEPAM 2 MG PO TABS Oral Take 2 mg by mouth at bedtime as needed.     Marland Kitchen  DILTIAZEM HCL ER COATED BEADS 120 MG PO CP24 Oral Take 120 mg by mouth daily.    Marland Kitchen FLUTICASONE PROPIONATE  HFA 220 MCG/ACT IN AERO Inhalation Inhale 1 puff into the lungs 2 (two) times daily.    . GUAIFENESIN ER 600 MG PO TB12 Oral Take 600 mg by mouth 2 (two) times daily.      . ISOSORBIDE DINITRATE 30 MG PO TABS Oral Take 30 mg by mouth 2 (two) times daily.    Marland Kitchen LORATADINE 10 MG PO TABS Oral Take 10 mg by mouth daily.      Marland Kitchen METOPROLOL SUCCINATE ER 25 MG PO TB24 Oral Take 25 mg by mouth daily.    . MOMETASONE FUROATE 50 MCG/ACT NA SUSP Nasal Place 1 spray into the nose daily as needed.    Marland Kitchen NITROGLYCERIN 0.4 MG SL SUBL Sublingual Place 0.4 mg under the tongue every 5 (five) minutes as needed. Chest  pain    . PANTOPRAZOLE SODIUM 40 MG PO TBEC Oral Take 40 mg by mouth daily.    Marland Kitchen ROSUVASTATIN CALCIUM 40 MG PO TABS Oral Take 40 mg by mouth daily.    Marland Kitchen VALSARTAN 40 MG PO TABS Oral Take 40 mg by mouth daily.    Marland Kitchen FLOVENT HFA 110 MCG/ACT IN AERO  INHALE 1 PUFF BY MOUTH TWICE A DAY 12 g PRN    BP 149/67  Pulse 68  Temp 98.9 F (37.2 C) (Oral)  Resp 19  SpO2 97%  Physical Exam  Nursing note and vitals reviewed. Constitutional:       Awake, alert, nontoxic appearance.  HENT:  Head: Atraumatic.  Eyes: Right eye exhibits no discharge. Left eye exhibits no discharge.  Neck: Neck supple.  Cardiovascular: Normal rate and regular rhythm.   No murmur heard. Pulmonary/Chest: Effort normal and breath sounds normal. No respiratory distress. She has no wheezes. She has no rales. She exhibits no tenderness.  Abdominal: Soft. Bowel sounds are normal. She exhibits no mass. There is no tenderness. There is no rebound and no guarding.  Musculoskeletal: She exhibits no tenderness.       Baseline ROM, no obvious new focal weakness.  Neurological:       Mental status and motor strength appears baseline for patient and situation.  Skin: No rash noted.  Psychiatric: She has a normal mood and affect.    ED Course  Procedures (including critical care time) ECG: Sinus rhythm with first degree AV block, ventricular rate 75, normal axis, no acute ischemic changes noted, no comparison ECG available Labs Reviewed  CBC - Abnormal; Notable for the following:    RBC 3.86 (*)     Hemoglobin 11.9 (*)     HCT 35.9 (*)     All other components within normal limits  BASIC METABOLIC PANEL - Abnormal; Notable for the following:    Glucose, Bld 105 (*)     Creatinine, Ser 0.42 (*)     All other components within normal limits  DIFFERENTIAL  POCT I-STAT TROPONIN I  POCT I-STAT TROPONIN I  POCT I-STAT TROPONIN I  LAB REPORT - SCANNED   No results found.   1. Angina pectoris       MDM  Pt stable in  ED with no significant deterioration in condition.Patient / Family / Caregiver informed of clinical course, understand medical decision-making process, and agree with plan.        Hurman Horn, MD 11/17/11 [phone]

## 2011-11-12 NOTE — ED Notes (Signed)
EKG given to Dr Bednar 

## 2011-11-12 NOTE — ED Provider Notes (Signed)
History     CSN: [id number]  Arrival date & time 11/12/11  1633   First MD Initiated Contact with Patient 11/12/11 1842      Chief Complaint  Patient presents with  . Chest Pain    (Consider location/radiation/quality/duration/timing/severity/associated sxs/prior treatment) HPI  Past Medical History  Diagnosis Date  . Arthritis   . Asthma   . GERD (gastroesophageal reflux disease)   . Hyperlipidemia   . Myocardial infarction   . Allergy   . Hypertension   . Eczema   . ASHD (arteriosclerotic heart disease)   . CAD (coronary artery disease) 06/2001  . Bronchitis     Past Surgical History  Procedure Date  . Abdominal hysterectomy 1976  . Carotid endarterectomy 2010  . Coronary artery bypass graft 01/2002  . Tonsillectomy   . Cardiac surgery stents  . Tumor removal     Family History  Problem Relation Age of Onset  . Heart failure Mother     History  Substance Use Topics  . Smoking status: Never Smoker   . Smokeless tobacco: Former Neurosurgeon    Types: Snuff    Quit date: 05/23/2001  . Alcohol Use: No    OB History    Grav Para Term Preterm Abortions TAB SAB Ect Mult Living                  Review of Systems  Allergies  Amitriptyline hcl; Aspirin; and Zetia  Home Medications   Current Outpatient Rx  Name Route Sig Dispense Refill  . ALBUTEROL SULFATE HFA 108 (90 BASE) MCG/ACT IN AERS Inhalation Inhale 2 puffs into the lungs every 4 (four) hours as needed. For shortness of breath    . BENZONATATE 100 MG PO CAPS Oral Take 100 mg by mouth 3 (three) times daily as needed. For cough    . CLOPIDOGREL BISULFATE 75 MG PO TABS Oral Take 75 mg by mouth daily.    Marland Kitchen DIAZEPAM 2 MG PO TABS Oral Take 2 mg by mouth at bedtime as needed.     Marland Kitchen DILTIAZEM HCL ER COATED BEADS 120 MG PO CP24 Oral Take 120 mg by mouth daily.    Marland Kitchen FLUTICASONE PROPIONATE  HFA 220 MCG/ACT IN AERO Inhalation Inhale 1 puff into the lungs 2 (two) times daily.    . GUAIFENESIN ER 600 MG PO TB12  Oral Take 600 mg by mouth 2 (two) times daily.      . ISOSORBIDE DINITRATE 30 MG PO TABS Oral Take 30 mg by mouth 2 (two) times daily.    Marland Kitchen LORATADINE 10 MG PO TABS Oral Take 10 mg by mouth daily.      Marland Kitchen METOPROLOL SUCCINATE ER 25 MG PO TB24 Oral Take 25 mg by mouth daily.    . MOMETASONE FUROATE 50 MCG/ACT NA SUSP Nasal Place 1 spray into the nose daily as needed.    Marland Kitchen NITROGLYCERIN 0.4 MG SL SUBL Sublingual Place 0.4 mg under the tongue every 5 (five) minutes as needed. Chest pain    . PANTOPRAZOLE SODIUM 40 MG PO TBEC Oral Take 40 mg by mouth daily.    Marland Kitchen ROSUVASTATIN CALCIUM 40 MG PO TABS Oral Take 40 mg by mouth daily.    Marland Kitchen VALSARTAN 40 MG PO TABS Oral Take 40 mg by mouth daily.      BP 124/52  Pulse 66  Temp 98.8 F (37.1 C) (Oral)  Resp 20  SpO2 96%  Physical Exam  ED Course  Procedures (including critical  care time)  Labs Reviewed  CBC - Abnormal; Notable for the following:    RBC 3.86 (*)     Hemoglobin 11.9 (*)     HCT 35.9 (*)     All other components within normal limits  BASIC METABOLIC PANEL - Abnormal; Notable for the following:    Glucose, Bld 105 (*)     Creatinine, Ser 0.42 (*)     All other components within normal limits  DIFFERENTIAL  POCT I-STAT TROPONIN I   Dg Chest Port 1 View  11/12/2011  *RADIOLOGY REPORT*  Clinical Data: Chest pain, hypertension.  PORTABLE CHEST - 1 VIEW  Comparison: 10/19/2007  Findings: Prior CABG.  Mild hyperinflation of the lungs.  No confluent airspace opacities or effusions.  No acute bony abnormality.  Heart is normal size.  IMPRESSION: COPD.  No active disease.  Original Report Authenticated By: Cyndie Chime, M.D.     No diagnosis found.  9:31 PM Patient from Pod A -- chest pain today after moving furniture. History of MI, CABG. History of stable angina. Plan: 2nd and 3rd cardiac markers at 9:30PM and 11:30PM. Likely dispo to home if these are negative.    Vital signs reviewed and are as follows: Filed Vitals:    11/12/11 1959  BP: 124/52  Pulse: 66  Temp: 98.8 F (37.1 C)  Resp: 20   9:34 PM Exam:  Gen NAD; Heart RRR, nml S1,S2, III/VI systolic murmur; Lungs CTAB; Abd soft, NT, no rebound or guarding; Ext 2+ pedal pulses bilaterally, no edema.  10:59 PM Second marker neg. Handoff to Dr. Erlene Senters.  Plan: d/c home if last marker neg.   MDM  Stable angina. Work-up neg.         Renne Crigler, [place] 11/12/11 2259

## 2011-11-13 LAB — POCT I-STAT TROPONIN I: Troponin i, poc: 0 ng/mL (ref 0.00–0.08)

## 2011-11-13 NOTE — ED Provider Notes (Signed)
Medical screening examination/treatment/procedure(s) were conducted as a shared visit with non-physician practitioner(s) and myself.  I personally evaluated the patient during the encounter  Hurman Horn, MD 11/13/11 1243

## 2011-11-13 NOTE — Discharge Instructions (Signed)
You did not have a heart attack but may have had a longer spell than usual of angina due to over-exerting yourself.  If you have the onset of chest pain at rest, or recurrent chest pain (or other concerns) that is worse than your usual angina symptoms, then call 911 and return immediately.  SEEK IMMEDIATE MEDICAL ATTENTION IF: You have severe chest pain, especially if the pain is crushing or pressure-like and spreads to the arms, back, neck, or jaw, or if you have sweating, nausea (feeling sick to your stomach), or shortness of breath. THIS IS AN EMERGENCY. Don't wait to see if the pain will go away. Get medical help at once. Call 911 or 0 (operator). DO NOT drive yourself to the hospital.  Your chest pain gets worse and does not go away with rest.  You have an attack of chest pain lasting longer than usual, despite rest and treatment with the medications your caregiver has prescribed.  You wake from sleep with chest pain or shortness of breath.  You feel dizzy or faint.  You have chest pain not typical of your usual pain for which you originally saw your caregiver.

## 2011-11-15 ENCOUNTER — Other Ambulatory Visit: Payer: Self-pay | Admitting: Family Medicine

## 2011-11-25 ENCOUNTER — Other Ambulatory Visit: Payer: Self-pay | Admitting: Family Medicine

## 2011-12-05 ENCOUNTER — Ambulatory Visit (INDEPENDENT_AMBULATORY_CARE_PROVIDER_SITE_OTHER): Payer: Medicare Other | Admitting: Family Medicine

## 2011-12-05 ENCOUNTER — Telehealth: Payer: Self-pay | Admitting: Family Medicine

## 2011-12-05 ENCOUNTER — Encounter: Payer: Self-pay | Admitting: Family Medicine

## 2011-12-05 VITALS — BP 110/58 | Wt 140.0 lb

## 2011-12-05 DIAGNOSIS — R350 Frequency of micturition: Secondary | ICD-10-CM | POA: Diagnosis not present

## 2011-12-05 DIAGNOSIS — J309 Allergic rhinitis, unspecified: Secondary | ICD-10-CM

## 2011-12-05 DIAGNOSIS — K219 Gastro-esophageal reflux disease without esophagitis: Secondary | ICD-10-CM | POA: Diagnosis not present

## 2011-12-05 DIAGNOSIS — E785 Hyperlipidemia, unspecified: Secondary | ICD-10-CM

## 2011-12-05 DIAGNOSIS — R319 Hematuria, unspecified: Secondary | ICD-10-CM

## 2011-12-05 DIAGNOSIS — I2510 Atherosclerotic heart disease of native coronary artery without angina pectoris: Secondary | ICD-10-CM

## 2011-12-05 DIAGNOSIS — M25539 Pain in unspecified wrist: Secondary | ICD-10-CM

## 2011-12-05 DIAGNOSIS — M25532 Pain in left wrist: Secondary | ICD-10-CM

## 2011-12-05 LAB — POCT URINALYSIS DIPSTICK
Bilirubin, UA: NEGATIVE
Glucose, UA: NEGATIVE
Ketones, UA: NEGATIVE
Spec Grav, UA: 1.01

## 2011-12-05 LAB — LIPID PANEL: Cholesterol: 161 mg/dL (ref 0–200)

## 2011-12-05 MED ORDER — ROSUVASTATIN CALCIUM 40 MG PO TABS: 40.0000 mg | ORAL_TABLET | Freq: Every day | ORAL | Status: DC

## 2011-12-05 MED ORDER — FLUTICASONE PROPIONATE 50 MCG/ACT NA SUSP: 2.0000 | Freq: Every day | NASAL | Status: DC

## 2011-12-05 MED ORDER — PANTOPRAZOLE SODIUM 40 MG PO TBEC: 40.0000 mg | DELAYED_RELEASE_TABLET | Freq: Every day | ORAL | Status: DC

## 2011-12-05 MED ORDER — METOPROLOL SUCCINATE ER 25 MG PO TB24: 25.0000 mg | ORAL_TABLET | Freq: Every day | ORAL | Status: DC

## 2011-12-05 NOTE — Patient Instructions (Signed)
Use 2 Tylenol 4 times per day and see if this will help your shoulder pain.

## 2011-12-05 NOTE — Telephone Encounter (Signed)
Ok have envelope ready

## 2011-12-05 NOTE — Progress Notes (Signed)
  Subjective:    Patient ID: [name], female    DOB: 26-Jan-1929, 78 y.o.   MRN: [id number]  HPI She is here for general medication check. She would like to let her medications renewed. She is on Crestor but having no difficulty with this. Her last blood work was in May of last year. She states that her steroid nasal spray is not working and she would like to try something different. Her reflux seems to be under good control with the use of Protonix. She does complain of difficulty with urinary frequency but no dysuria, urgency or incontinence. She apparently injured her left wrist approximately one year ago and has been using a splint on the since then. Apparently one year ago she was seen in an urgent care Center for this. She also complains of bilateral shoulder pain and states this is been going on for several years. She has intermittently used Tylenol for this but in very small doses.   Review of Systems     Objective:   Physical Exam Alert and in no distress. Left wrist exam does show pain on palpation over the carpals. Pain on motion of the wrist. Urine dipstick was positive and microscopic did show scattered white cells.       Assessment & Plan:   1. HYPERLIPIDEMIA  Lipid panel, rosuvastatin (CRESTOR) 40 MG tablet  2. ALLERGIC RHINITIS  fluticasone (FLONASE) 50 MCG/ACT nasal spray  3. CAD, NATIVE VESSEL  metoprolol succinate (TOPROL-XL) 25 MG 24 hr tablet  4. GERD (gastroesophageal reflux disease)  pantoprazole (PROTONIX) 40 MG tablet  5. Left wrist pain  Ambulatory referral to Orthopedic Surgery  6. Blood in urine  POCT Urinalysis Dipstick  7. Frequency of urination  CULTURE, URINE COMPREHENSIVE   the urinalysis was questionable. I will get a culture on this. This could easily be OAB. Since she's been having difficulty with wrist pain for over a year, I will refer to Dr. Amanda Pea. We'll also switch her to Flonase to see if this will help with the symptoms. May possibly need to  use Astelin.

## 2011-12-08 LAB — CULTURE, URINE COMPREHENSIVE: Colony Count: 15000

## 2011-12-12 ENCOUNTER — Telehealth: Payer: Self-pay | Admitting: Internal Medicine

## 2011-12-12 DIAGNOSIS — K219 Gastro-esophageal reflux disease without esophagitis: Secondary | ICD-10-CM

## 2011-12-12 MED ORDER — PANTOPRAZOLE SODIUM 40 MG PO TBEC: 40.0000 mg | DELAYED_RELEASE_TABLET | Freq: Two times a day (BID) | ORAL | Status: DC

## 2011-12-12 NOTE — Telephone Encounter (Signed)
Resent med per pt . 2 times not once a day

## 2011-12-23 ENCOUNTER — Other Ambulatory Visit: Payer: Self-pay | Admitting: Dermatology

## 2011-12-23 DIAGNOSIS — L820 Inflamed seborrheic keratosis: Secondary | ICD-10-CM | POA: Diagnosis not present

## 2011-12-23 DIAGNOSIS — D2330 Other benign neoplasm of skin of unspecified part of face: Secondary | ICD-10-CM | POA: Diagnosis not present

## 2011-12-23 DIAGNOSIS — L578 Other skin changes due to chronic exposure to nonionizing radiation: Secondary | ICD-10-CM | POA: Diagnosis not present

## 2011-12-23 DIAGNOSIS — D485 Neoplasm of uncertain behavior of skin: Secondary | ICD-10-CM | POA: Diagnosis not present

## 2011-12-27 ENCOUNTER — Other Ambulatory Visit: Payer: Self-pay | Admitting: Cardiovascular Disease

## 2011-12-27 MED ORDER — NITROGLYCERIN 0.4 MG SL SUBL: 0.4000 mg | SUBLINGUAL_TABLET | SUBLINGUAL | Status: DC | PRN

## 2011-12-28 ENCOUNTER — Telehealth: Payer: Self-pay | Admitting: Family Medicine

## 2011-12-28 NOTE — Telephone Encounter (Signed)
Pt called and stated that her ins would no longer pay for Protonix.  Pt states they will pay for generic Prilosec. Pt was informed that she might need an appointment to go over this and pt stated she was just in and had been on medication before. Pt uses [facility] on [address]

## 2011-12-29 MED ORDER — OMEPRAZOLE 40 MG PO CPDR: 40.0000 mg | DELAYED_RELEASE_CAPSULE | Freq: Every day | ORAL | Status: DC

## 2011-12-29 NOTE — Telephone Encounter (Signed)
Tell her that I switch her to Prilosec but one per day and see how that does. If that does not hold her ,have her call us back in I will double the dosing

## 2011-12-29 NOTE — Telephone Encounter (Signed)
Pt informed word for word  

## 2012-01-02 ENCOUNTER — Telehealth: Payer: Self-pay | Admitting: Family Medicine

## 2012-01-03 ENCOUNTER — Other Ambulatory Visit: Payer: Self-pay

## 2012-01-03 MED ORDER — DEXLANSOPRAZOLE 60 MG PO CPDR: 60.0000 mg | DELAYED_RELEASE_CAPSULE | Freq: Every day | ORAL | Status: DC

## 2012-01-03 NOTE — Telephone Encounter (Signed)
Changed pt to dexilant 60 mg per jcl due to ins.wouldnt cover omeprazole anymore called pt has been informed

## 2012-01-06 ENCOUNTER — Encounter: Payer: Self-pay | Admitting: Cardiovascular Disease

## 2012-01-06 ENCOUNTER — Ambulatory Visit (INDEPENDENT_AMBULATORY_CARE_PROVIDER_SITE_OTHER): Payer: Medicare Other | Admitting: Cardiovascular Disease

## 2012-01-06 VITALS — BP 122/76 | HR 78 | Ht 65.0 in | Wt 140.0 lb

## 2012-01-06 DIAGNOSIS — I2510 Atherosclerotic heart disease of native coronary artery without angina pectoris: Secondary | ICD-10-CM

## 2012-01-06 DIAGNOSIS — I779 Disorder of arteries and arterioles, unspecified: Secondary | ICD-10-CM | POA: Diagnosis not present

## 2012-01-06 DIAGNOSIS — I340 Nonrheumatic mitral (valve) insufficiency: Secondary | ICD-10-CM

## 2012-01-06 DIAGNOSIS — I059 Rheumatic mitral valve disease, unspecified: Secondary | ICD-10-CM

## 2012-01-06 DIAGNOSIS — I359 Nonrheumatic aortic valve disorder, unspecified: Secondary | ICD-10-CM

## 2012-01-06 MED ORDER — NITROGLYCERIN 0.4 MG SL SUBL: 0.4000 mg | SUBLINGUAL_TABLET | SUBLINGUAL | Status: DC | PRN

## 2012-01-06 MED ORDER — CLOPIDOGREL BISULFATE 75 MG PO TABS: 75.0000 mg | ORAL_TABLET | Freq: Every day | ORAL | Status: DC

## 2012-01-06 NOTE — Progress Notes (Signed)
History of Present Illness: 78 yo female with history of CAD status post bypass surgery in September 2006 (Dr. Donata Clay) as well as a Cox-Maze procedure for atrial fibrillation, CVA, carotid artery stenosis s/p R CEA, fibromyalgia, GERD, hiatal hernia, severe kyphoscoloisis, moderate MR, mild AS here today for cardiac follow up. She has been followed in the past by Dr. Gala Romney. Myoview 8/10: EF 70% small lateral infarct no ischemia. I saw her in February 2013 and she had c/o occasional chest pains which were atypical. Echo 08/17/11 with normal LV size and function with LVEF of 55%, moderate AS (mean gradient )  with heavily calcified AV, mild to moderate MR. Carotid artery dopplers April 2013 with 40-59% bilateral stenosis with stable RCEA site.   She is here today for follow up. She moved a chair on 11/12/11 and had some nausea. She was seen in the ED on 11/12/11 and had negative cardiac markers and no EKG changes. She tells me that she continues to have chest pains several times per week that are mild and resolve with NTG SL.  She has no change in her breathing.   Primary Care Physician: Sharlot Gowda  Last Lipid Profile:  Lipid Panel     Component Value Date/Time   CHOL 161 12/05/2011 1553   TRIG 133 12/05/2011 1553   HDL 58 12/05/2011 1553   CHOLHDL 2.8 12/05/2011 1553   VLDL 27 12/05/2011 1553   LDLCALC 76 12/05/2011 1553     Past Medical History  Diagnosis Date  . Arthritis   . Asthma   . GERD (gastroesophageal reflux disease)   . Hyperlipidemia   . Myocardial infarction   . Allergy   . Hypertension   . Eczema   . ASHD (arteriosclerotic heart disease)   . CAD (coronary artery disease) 06/2001  . Bronchitis     Past Surgical History  Procedure Date  . Abdominal hysterectomy 1976  . Carotid endarterectomy 2010  . Coronary artery bypass graft 01/2002  . Tonsillectomy   . Cardiac surgery stents  . Tumor removal     Current Outpatient Prescriptions  Medication Sig  Dispense Refill  . albuterol (PROVENTIL HFA;VENTOLIN HFA) 108 (90 BASE) MCG/ACT inhaler Inhale 2 puffs into the lungs every 4 (four) hours as needed. For shortness of breath      . benzonatate (TESSALON) 100 MG capsule Take 100 mg by mouth 3 (three) times daily as needed. For cough      . clopidogrel (PLAVIX) 75 MG tablet Take 75 mg by mouth daily.      Marland Kitchen dexlansoprazole (DEXILANT) 60 MG capsule Take 1 capsule (60 mg total) by mouth daily.  30 capsule  5  . diazepam (VALIUM) 2 MG tablet Take 2 mg by mouth at bedtime as needed.       . diltiazem (CARDIZEM CD) 120 MG 24 hr capsule Take 120 mg by mouth daily.      Marland Kitchen FLOVENT HFA 110 MCG/ACT inhaler INHALE 1 PUFF BY MOUTH TWICE A DAY  12 g  PRN  . fluticasone (FLONASE) 50 MCG/ACT nasal spray Place 2 sprays into the nose daily.  16 g  11  . fluticasone (FLOVENT HFA) 220 MCG/ACT inhaler Inhale 1 puff into the lungs 2 (two) times daily.      Marland Kitchen guaiFENesin (MUCINEX) 600 MG 12 hr tablet Take 600 mg by mouth 2 (two) times daily.        . isosorbide dinitrate (ISORDIL) 30 MG tablet Take 30 mg by mouth  2 (two) times daily.      Marland Kitchen loratadine (CLARITIN) 10 MG tablet Take 10 mg by mouth daily.        . metoprolol succinate (TOPROL-XL) 25 MG 24 hr tablet Take 1 tablet (25 mg total) by mouth daily.  30 tablet  11  . mometasone (NASONEX) 50 MCG/ACT nasal spray Place 1 spray into the nose daily as needed.      . nitroGLYCERIN (NITROSTAT) 0.4 MG SL tablet Place 1 tablet (0.4 mg total) under the tongue every 5 (five) minutes as needed. Chest pain  25 tablet  11  . omeprazole (PRILOSEC) 40 MG capsule Take 1 capsule (40 mg total) by mouth daily.  30 capsule  11  . rosuvastatin (CRESTOR) 40 MG tablet Take 1 tablet (40 mg total) by mouth daily.  30 tablet  11  . valsartan (DIOVAN) 40 MG tablet Take 40 mg by mouth daily.        Allergies  Allergen Reactions  . Amitriptyline Hcl   . Aspirin   . Zetia (Ezetimibe)     History   Social History  . Marital Status:  Widowed    Spouse Name: N/A    Number of Children: 3  . Years of Education: N/A   Occupational History  .     Social History Main Topics  . Smoking status: Never Smoker   . Smokeless tobacco: Former Neurosurgeon    Types: Snuff    Quit date: 05/23/2001  . Alcohol Use: No  . Drug Use: No  . Sexually Active: Not Currently   Other Topics Concern  . Not on file   Social History Narrative  . No narrative on file    Family History  Problem Relation Age of Onset  . Heart failure Mother     Review of Systems:  As stated in the HPI and otherwise negative.   BP 122/76  Pulse 78  Ht 5\' 5"  (1.651 m)  Wt 140 lb (63.504 kg)  BMI 23.30 kg/m2  Physical Examination: General: Well developed, well nourished, NAD HEENT: OP clear, mucus membranes moist SKIN: warm, dry. No rashes. Neuro: No focal deficits Musculoskeletal: Muscle strength 5/5 all ext Psychiatric: Mood and affect normal Neck: No JVD, no carotid bruits, no thyromegaly, no lymphadenopathy. Lungs:Clear bilaterally, no wheezes, rhonci, crackles Cardiovascular: Regular rate and rhythm. Systolic murmur. No  gallops or rubs. Abdomen:Soft. Bowel sounds present. Non-tender.  Extremities: No lower extremity edema. Pulses are 2 + in the bilateral DP/PT.  Echo 08/17/11:  Left ventricle: The cavity size was normal. Wall thickness was increased in a pattern of mild LVH. Systolic function was normal. The estimated ejection fraction was in the range of 55% to 60%. Wall motion was normal; there were no regional wall motion abnormalities. Features are consistent with a pseudonormal left ventricular filling pattern, with concomitant abnormal relaxation and increased filling pressure (grade 2 diastolic dysfunction). - Aortic valve: Probably trileaflet; severely calcified leaflets. Mean gradient: 16mm Hg (S). Peak gradient:75mm Hg (S). Valve area: 0.86cm^2(VTI). Visually and by calculated valve area, severe aortic stenosis. Gradient  is only mild, however. - Mitral valve: Moderately calcified annulus. Moderately calcified leaflets . Mild to moderate regurgitation. - Left atrium: The atrium was mildly dilated. - Right ventricle: The cavity size was normal. Systolic function was normal. - Tricuspid valve: Peak RV-RA gradient: 30mm Hg (S). - Pulmonary arteries: PA peak pressure: 35mm Hg (S). - Inferior vena cava: The vessel was normal in size; the respirophasic diameter changes were in  the normal range (= 50%); findings are consistent with normal central venous pressure. Impressions:  - Normal LV size and systolic function, EF 55-60%. Mild LV hypertrophy. Severe aortic valve calcification. By mean gradient, mild aortic stenosis but by calculated valve area, severe stenosis. Based on the appearance of the valve, I would be concerned that this could potentially be low gradient severe aortic stenosis (this phenomenon can be present even with normal LV systolic function). Normal RV size and systolic function. Mild to moderate MR.

## 2012-01-06 NOTE — Patient Instructions (Addendum)
Your physician wants you to follow-up in:  6 months. You will receive a reminder letter in the mail two months in advance. If you don't receive a letter, please call our office to schedule the follow-up appointment.   

## 2012-01-06 NOTE — Addendum Note (Signed)
Addended by: Dossie Arbour on: 01/06/2012 12:01 PM   Modules accepted: Orders

## 2012-01-06 NOTE — Assessment & Plan Note (Signed)
Stable moderate bilateral disease. Repeat dopplers April 2014.

## 2012-01-06 NOTE — Assessment & Plan Note (Signed)
Mild to moderate by echo March 2013. Will folllow.

## 2012-01-06 NOTE — Assessment & Plan Note (Signed)
Moderate by echo 3/13. Follow. NO symptoms. Repeat echo one year.

## 2012-01-06 NOTE — Assessment & Plan Note (Signed)
She is s/p bypass and doing well. She has chronic, stable angina. I have instructed her to let me know if her chest pain changes in severity or frequency. I have suggested a stress test but she refuses to ever have a stress test again. I do not think that cardiac cath is indicated at this time with her current symptoms. Continue current meds. She is intolerant to ASA but takes clopidogrel daily. Lipids and BP well controlled. I have written a letter regarding importance of dental extraction for her overall cardiac health with valvular disease and CAD.

## 2012-01-11 NOTE — Telephone Encounter (Signed)
PT'S INS WOULD PAY FOR DEXILANT. THIS WAS SWITCHED PER CHERI & PT WAS ADVISED-LM

## 2012-02-08 ENCOUNTER — Other Ambulatory Visit: Payer: Self-pay

## 2012-02-08 MED ORDER — OMEPRAZOLE 20 MG PO CPDR: DELAYED_RELEASE_CAPSULE | ORAL | Status: DC

## 2012-02-08 NOTE — Telephone Encounter (Signed)
Sent in med for pt. 

## 2012-03-28 ENCOUNTER — Encounter: Payer: Self-pay | Admitting: Pulmonary Disease

## 2012-03-28 ENCOUNTER — Ambulatory Visit (INDEPENDENT_AMBULATORY_CARE_PROVIDER_SITE_OTHER): Payer: Medicare Other | Admitting: Pulmonary Disease

## 2012-03-28 VITALS — BP 120/58 | HR 66 | Temp 97.9°F | Ht 65.0 in | Wt 140.4 lb

## 2012-03-28 DIAGNOSIS — Z23 Encounter for immunization: Secondary | ICD-10-CM

## 2012-03-28 DIAGNOSIS — R059 Cough, unspecified: Secondary | ICD-10-CM

## 2012-03-28 DIAGNOSIS — R05 Cough: Secondary | ICD-10-CM | POA: Diagnosis not present

## 2012-03-28 MED ORDER — ALBUTEROL SULFATE HFA 108 (90 BASE) MCG/ACT IN AERS: 2.0000 | INHALATION_SPRAY | RESPIRATORY_TRACT | Status: DC | PRN

## 2012-03-28 MED ORDER — GLYCOPYRROLATE 2 MG PO TABS: 2.0000 mg | ORAL_TABLET | Freq: Every day | ORAL | Status: DC

## 2012-03-28 MED ORDER — BENZONATATE 100 MG PO CAPS: 100.0000 mg | ORAL_CAPSULE | Freq: Three times a day (TID) | ORAL | Status: DC | PRN

## 2012-03-28 NOTE — Patient Instructions (Addendum)
Trial of robinul daily for secretions If this does not work, we will get you nebuliser with medications Flu shot Sample of albuterol Tessalon refill x 3

## 2012-03-28 NOTE — Progress Notes (Signed)
  Subjective:    Patient ID: [name], female    DOB: [birth year], 78 y.o.   MRN: [id number]  HPI 78/F, never smoker with kyphoscoliosis & chronic productive cough since 2003.  She has focal encephalomalacia on MRI  PFTs in March 2004 have shown significant improvement in FEV1 with bronchodilator, hence maintained on inhaled steroids. She also has hiatal hernia with severe GERD symptoms and takes PPI twice a day with some breakthrough episodes of reflux. She is treated as mild perssitent asthma , Spirometry 5/09 >> shows nml lung function. Did not tolerate advair. Off singulair - body aches, on claritin instead  She describes an occasional episode of strangling in her throat, lasting a few minutes, that has been ongoing for many years, note negative ENT exam. An ACE inhibitor was discontinued in April 2008.  Her dyspnea is felt to be multifactorial - kyphoscoliosis, cadriac issues & mild asthma + deconditioning  Her chronic cough is also multifactorial - hiatal hernia/ GERD, mild asthma , sinus drainage.  CT chest in '05 & morerecently CT neck in 2011 have shown biapical parenchymal scarring     03/28/2012 78 yr FU - accompanied by daughter cough is little worse. coughs up yellow-clear phlem-it is like glue. DOE no wheezing, chest tx,  Stringy mucus - called 911 on one occasion due to choking on secretions No heartburn or obvious sinus congestion - limited benefit with mucinex & tessalon Denies chest pain, orthopnea, hemoptysis, fever, n/v/d, edema, headache. Not related to swallowing 'Is there any medication that can decrease mucus production ?' CXR 6/13 clear seen by neuro  Past Medical History  Diagnosis Date  . Arthritis   . Asthma   . GERD (gastroesophageal reflux disease)   . Hyperlipidemia   . Myocardial infarction   . Allergy   . Hypertension   . Eczema   . ASHD (arteriosclerotic heart disease)   . CAD (coronary artery disease) 06/2001  . Bronchitis      Review of  Systems Patient denies significant dyspnea,cough, hemoptysis,  chest pain, palpitations, pedal edema, orthopnea, paroxysmal nocturnal dyspnea, lightheadedness, nausea, vomiting, abdominal or  leg pains      Objective:   Physical Exam  Gen. Pleasant, in no distress, normal affect ENT - no lesions, no post nasal drip, class 2 airway Neck: No JVD, no thyromegaly, no carotid bruits Lungs: no use of accessory muscles, no dullness to percussion, decreased without rales or rhonchi  Cardiovascular: Rhythm regular, heart sounds  normal, no murmurs or gallops, no peripheral edema Abdomen: soft and non-tender, no hepatosplenomegaly, BS normal. Musculoskeletal: No deformities, no cyanosis or clubbing Neuro:  alert, non focal, no tremors       Assessment & Plan:

## 2012-03-29 ENCOUNTER — Telehealth: Payer: Self-pay | Admitting: Pulmonary Disease

## 2012-03-29 NOTE — Assessment & Plan Note (Signed)
Her chronic cough is also multifactorial - hiatal hernia/ GERD, mild asthma , sinus drainage. Secretions major issue  Trial of robinul daily for secretions Consider swallow evaluation If this does not work, we will get her nebuliser with albuterol prn to use for emergency Flu shot Sample of albuterol Tessalon refill x 3

## 2012-03-29 NOTE — Telephone Encounter (Signed)
Pl let her know I have ordered swallow evaluation to check her hiatal hernia & to see whether this is contributing to mucus production

## 2012-03-30 NOTE — Telephone Encounter (Signed)
I spoke with pt and is aware swallowing evaluation is order to check her hiatal hernia and see whether this is contributing to mucus production. Nothing further was needed

## 2012-04-02 ENCOUNTER — Telehealth: Payer: Self-pay | Admitting: Pulmonary Disease

## 2012-04-02 NOTE — Telephone Encounter (Signed)
That is OK I note ba swallow in 2012 showed hiatal hernia CT nech in 2011 showed cervical spondylosis

## 2012-04-02 NOTE — Telephone Encounter (Signed)
Spoke with pt. She states calling to let RA know that she is cancelling her swallowing eval b/c she has had 2 MBS tests "pretty recent" she thinks within the past yr. She states that they were ordered by Dr. Loreta Ave and "showed that there is something there, but they said nothing can be done about it".  Will forward to RA to let him know.

## 2012-05-17 DIAGNOSIS — M659 Synovitis and tenosynovitis, unspecified: Secondary | ICD-10-CM | POA: Diagnosis not present

## 2012-06-28 ENCOUNTER — Other Ambulatory Visit: Payer: Self-pay | Admitting: Family Medicine

## 2012-07-30 ENCOUNTER — Observation Stay (HOSPITAL_COMMUNITY)
Admission: EM | Admit: 2012-07-30 | Discharge: 2012-07-31 | Disposition: A | Discharge status: Home / Self Care | Payer: Medicare Other | Attending: Cardiology | Admitting: Cardiology

## 2012-07-30 ENCOUNTER — Encounter (HOSPITAL_COMMUNITY): Payer: Self-pay | Admitting: *Deleted

## 2012-07-30 ENCOUNTER — Emergency Department (HOSPITAL_COMMUNITY): Payer: Medicare Other

## 2012-07-30 ENCOUNTER — Other Ambulatory Visit: Payer: Self-pay

## 2012-07-30 DIAGNOSIS — M549 Dorsalgia, unspecified: Secondary | ICD-10-CM | POA: Diagnosis not present

## 2012-07-30 DIAGNOSIS — R1013 Epigastric pain: Secondary | ICD-10-CM | POA: Diagnosis not present

## 2012-07-30 DIAGNOSIS — R0789 Other chest pain: Secondary | ICD-10-CM | POA: Diagnosis not present

## 2012-07-30 DIAGNOSIS — M546 Pain in thoracic spine: Secondary | ICD-10-CM | POA: Diagnosis not present

## 2012-07-30 DIAGNOSIS — I359 Nonrheumatic aortic valve disorder, unspecified: Secondary | ICD-10-CM | POA: Insufficient documentation

## 2012-07-30 DIAGNOSIS — R0781 Pleurodynia: Secondary | ICD-10-CM

## 2012-07-30 DIAGNOSIS — I2510 Atherosclerotic heart disease of native coronary artery without angina pectoris: Secondary | ICD-10-CM | POA: Diagnosis not present

## 2012-07-30 DIAGNOSIS — R079 Chest pain, unspecified: Secondary | ICD-10-CM

## 2012-07-30 DIAGNOSIS — E785 Hyperlipidemia, unspecified: Secondary | ICD-10-CM | POA: Diagnosis not present

## 2012-07-30 DIAGNOSIS — J449 Chronic obstructive pulmonary disease, unspecified: Secondary | ICD-10-CM | POA: Diagnosis not present

## 2012-07-30 DIAGNOSIS — R072 Precordial pain: Secondary | ICD-10-CM | POA: Diagnosis not present

## 2012-07-30 DIAGNOSIS — F411 Generalized anxiety disorder: Secondary | ICD-10-CM | POA: Diagnosis not present

## 2012-07-30 DIAGNOSIS — R6889 Other general symptoms and signs: Secondary | ICD-10-CM | POA: Diagnosis not present

## 2012-07-30 HISTORY — DX: Cerebral infarction, unspecified: I63.9

## 2012-07-30 LAB — URINALYSIS, ROUTINE W REFLEX MICROSCOPIC
Glucose, UA: NEGATIVE mg/dL
Hgb urine dipstick: NEGATIVE
Specific Gravity, Urine: 1.012 (ref 1.005–1.030)
pH: 7 (ref 5.0–8.0)

## 2012-07-30 LAB — COMPREHENSIVE METABOLIC PANEL
AST: 22 U/L (ref 0–37)
Albumin: 3.9 g/dL (ref 3.5–5.2)
BUN: 12 mg/dL (ref 6–23)
Calcium: 9.3 mg/dL (ref 8.4–10.5)
Chloride: 105 mEq/L (ref 96–112)
Creatinine, Ser: 0.34 mg/dL — ABNORMAL LOW (ref 0.50–1.10)
Total Bilirubin: 0.3 mg/dL (ref 0.3–1.2)
Total Protein: 7 g/dL (ref 6.0–8.3)

## 2012-07-30 LAB — URINE MICROSCOPIC-ADD ON

## 2012-07-30 LAB — CBC
HCT: 37.7 % (ref 36.0–46.0)
Hemoglobin: 12.5 g/dL (ref 12.0–15.0)
MCHC: 33.2 g/dL (ref 30.0–36.0)
RDW: 13.3 % (ref 11.5–15.5)
WBC: 9.5 10*3/uL (ref 4.0–10.5)

## 2012-07-30 LAB — CREATININE, SERUM
Creatinine, Ser: 0.39 mg/dL — ABNORMAL LOW (ref 0.50–1.10)
GFR calc Af Amer: >90 mL/min (ref >90)
GFR calc non Af Amer: >90 mL/min (ref >90)

## 2012-07-30 LAB — CBC WITH DIFFERENTIAL/PLATELET
Basophils Absolute: 0 10*3/uL (ref 0.0–0.1)
Basophils Relative: 0 % (ref 0–1)
Eosinophils Relative: 1 % (ref 0–5)
Lymphocytes Relative: 10 % — ABNORMAL LOW (ref 12–46)
MCHC: 33.7 g/dL (ref 30.0–36.0)
MCV: 92.3 fL (ref 78.0–100.0)
Platelets: 189 10*3/uL (ref 150–400)
RDW: 13.3 % (ref 11.5–15.5)
WBC: 10.2 10*3/uL (ref 4.0–10.5)

## 2012-07-30 LAB — POCT I-STAT TROPONIN I

## 2012-07-30 LAB — LIPASE, BLOOD: Lipase: 16 U/L (ref 11–59)

## 2012-07-30 MED ORDER — ACETAMINOPHEN 325 MG PO TABS: 650.0000 mg | ORAL_TABLET | ORAL | Status: DC | PRN

## 2012-07-30 MED ORDER — SODIUM CHLORIDE 0.9 % IJ SOLN: 3.0000 mL | INTRAMUSCULAR | Status: DC | PRN

## 2012-07-30 MED ORDER — ATORVASTATIN CALCIUM 80 MG PO TABS
80.0000 mg | ORAL_TABLET | Freq: Every day | ORAL | Status: DC
  Administered 2012-07-30: 80 mg via ORAL
  Filled 2012-07-30 (×3): qty 1

## 2012-07-30 MED ORDER — ONDANSETRON HCL 4 MG/2ML IJ SOLN: 4.0000 mg | Freq: Four times a day (QID) | INTRAMUSCULAR | Status: DC | PRN

## 2012-07-30 MED ORDER — LORATADINE 10 MG PO TABS
10.0000 mg | ORAL_TABLET | Freq: Every day | ORAL | Status: DC
  Administered 2012-07-30 – 2012-07-31 (×2): 10 mg via ORAL
  Filled 2012-07-30 (×2): qty 1

## 2012-07-30 MED ORDER — SODIUM CHLORIDE 0.9 % IV SOLN: 250.0000 mL | INTRAVENOUS | Status: DC | PRN

## 2012-07-30 MED ORDER — ENOXAPARIN SODIUM 40 MG/0.4ML ~~LOC~~ SOLN
40.0000 mg | SUBCUTANEOUS | Status: DC
  Administered 2012-07-30: 40 mg via SUBCUTANEOUS
  Filled 2012-07-30 (×2): qty 0.4

## 2012-07-30 MED ORDER — NITROGLYCERIN 0.4 MG SL SUBL: 0.4000 mg | SUBLINGUAL_TABLET | SUBLINGUAL | Status: DC | PRN

## 2012-07-30 MED ORDER — PANTOPRAZOLE SODIUM 40 MG PO TBEC
40.0000 mg | DELAYED_RELEASE_TABLET | Freq: Every day | ORAL | Status: DC
  Administered 2012-07-30 – 2012-07-31 (×2): 40 mg via ORAL
  Filled 2012-07-30: qty 1

## 2012-07-30 MED ORDER — IRBESARTAN 75 MG PO TABS
75.0000 mg | ORAL_TABLET | Freq: Every day | ORAL | Status: DC
  Administered 2012-07-30 – 2012-07-31 (×2): 75 mg via ORAL
  Filled 2012-07-30 (×3): qty 1

## 2012-07-30 MED ORDER — FLUTICASONE PROPIONATE 50 MCG/ACT NA SUSP
2.0000 | Freq: Every day | NASAL | Status: DC
  Administered 2012-07-30: 2 via NASAL
  Filled 2012-07-30: qty 16

## 2012-07-30 MED ORDER — DILTIAZEM HCL ER COATED BEADS 120 MG PO CP24: 120.0000 mg | ORAL_CAPSULE | Freq: Every day | ORAL | Status: DC

## 2012-07-30 MED ORDER — ALPRAZOLAM 0.25 MG PO TABS: 0.2500 mg | ORAL_TABLET | Freq: Two times a day (BID) | ORAL | Status: DC | PRN

## 2012-07-30 MED ORDER — DILTIAZEM HCL ER COATED BEADS 120 MG PO CP24
120.0000 mg | ORAL_CAPSULE | Freq: Every day | ORAL | Status: DC
  Administered 2012-07-30 – 2012-07-31 (×2): 120 mg via ORAL
  Filled 2012-07-30 (×2): qty 1

## 2012-07-30 MED ORDER — METOPROLOL SUCCINATE ER 25 MG PO TB24
25.0000 mg | ORAL_TABLET | Freq: Every day | ORAL | Status: DC
  Administered 2012-07-30 – 2012-07-31 (×2): 25 mg via ORAL
  Filled 2012-07-30 (×2): qty 1

## 2012-07-30 MED ORDER — CLOPIDOGREL BISULFATE 75 MG PO TABS
75.0000 mg | ORAL_TABLET | Freq: Every day | ORAL | Status: DC
  Administered 2012-07-30 – 2012-07-31 (×2): 75 mg via ORAL
  Filled 2012-07-30 (×2): qty 1

## 2012-07-30 MED ORDER — ALBUTEROL SULFATE HFA 108 (90 BASE) MCG/ACT IN AERS: 2.0000 | INHALATION_SPRAY | RESPIRATORY_TRACT | Status: DC | PRN

## 2012-07-30 MED ORDER — SODIUM CHLORIDE 0.9 % IJ SOLN: 3.0000 mL | Freq: Two times a day (BID) | INTRAMUSCULAR | Status: DC

## 2012-07-30 MED ORDER — ZOLPIDEM TARTRATE 5 MG PO TABS: 5.0000 mg | ORAL_TABLET | Freq: Every evening | ORAL | Status: DC | PRN

## 2012-07-30 MED ORDER — SODIUM CHLORIDE 0.9 % IV SOLN
INTRAVENOUS | Status: DC
  Administered 2012-07-30: 08:00:00 via INTRAVENOUS

## 2012-07-30 NOTE — H&P (Signed)
History and Physical   Patient ID: [name] MRN: [id number], DOB/AGE: October 25, 1928 79 y.o. Date of Encounter: 07/30/2012  Primary Physician: Carollee Herter, MD Primary Cardiologist: CM  Chief Complaint:  Chest pain  HPI: [name] is a 79 y.o. female with a history of CAD. She has not had power/heat for 3 days. She feels stressed by this but had been doing OK. Today about 4 am, she was warm and in bed and had onset of right lateral rib pain. It reached a 5 or 6/10. She had some nausea with it. She took an Alka-Seltzer which helped the nausea. The rib pain was worse with deep inspiration. She then developed mid scapular back pain which also reached a 5 or 6/10. She took a nitroglycerin but the nitroglycerin did not change the pain at all. There was no change in the pain with deep inspiration or movement. The pain continued and she came to the hospital by EMS because the pain was lasting longer than usual and she was still nauseated. In the emergency room, she still has some minor mid scapular pain at a 1/10 but her rib pain has resolved although her lower ribs edges are tender to palpation. She was trembling a great deal when EMS transported her but feels this was because of being cold and anxious. She is currently resting comfortably.   Past Medical History  Diagnosis Date  . Arthritis   . Asthma   . GERD (gastroesophageal reflux disease)   . Hyperlipidemia   . Myocardial infarction 2003    Stent to CFX  . Allergy   . Hypertension   . Eczema   . ASHD (arteriosclerotic heart disease)   . CAD (coronary artery disease) 06/2001  . Bronchitis   . Stroke     Surgical History:  Past Surgical History  Procedure Laterality Date  . Abdominal hysterectomy  1976  . Carotid endarterectomy  2010  . Coronary artery bypass graft  01/2005    LIMA-D1; SVG-LAD; SVG-OM; SVG-PDA  . Tonsillectomy    . Cardiac surgery  stents  . Tumor removal       I have reviewed the patient's  current medications. Prior to Admission medications   Medication Sig Start Date End Date Taking? Authorizing Provider  albuterol (PROAIR HFA) 108 (90 BASE) MCG/ACT inhaler Inhale 2 puffs into the lungs every 4 (four) hours as needed for wheezing. 03/28/12  Yes Oretha Milch, MD  clopidogrel (PLAVIX) 75 MG tablet Take 1 tablet (75 mg total) by mouth daily. 01/06/12  Yes Kathleene Hazel, MD  diltiazem (CARDIZEM CD) 120 MG 24 hr capsule Take 120 mg by mouth daily.   Yes Historical Provider, MD  fluticasone (FLONASE) 50 MCG/ACT nasal spray Place 2 sprays into the nose daily. 12/05/11 12/04/12 Yes Ronnald Nian, MD  fluticasone (FLOVENT HFA) 110 MCG/ACT inhaler Inhale 1 puff into the lungs 2 (two) times daily.   Yes Historical Provider, MD  guaiFENesin (MUCINEX) 600 MG 12 hr tablet Take 600 mg by mouth 2 (two) times daily.     Yes Historical Provider, MD  isosorbide dinitrate (ISORDIL) 30 MG tablet Take 30 mg by mouth 2 (two) times daily.   Yes Historical Provider, MD  loratadine (CLARITIN) 10 MG tablet Take 10 mg by mouth daily.     Yes Historical Provider, MD  metoprolol succinate (TOPROL-XL) 25 MG 24 hr tablet Take 1 tablet (25 mg total) by mouth daily. 12/05/11  Yes Ronnald Nian, MD  nitroGLYCERIN (NITROSTAT) 0.4 MG SL tablet Place 1 tablet (0.4 mg total) under the tongue every 5 (five) minutes as needed. Chest pain 01/06/12  Yes Kathleene Hazel, MD  omeprazole (PRILOSEC) 20 MG capsule take 2 capsules by mouth daily 06/28/12  Yes Ronnald Nian, MD  rosuvastatin (CRESTOR) 40 MG tablet Take 1 tablet (40 mg total) by mouth daily. 12/05/11  Yes Ronnald Nian, MD  valsartan (DIOVAN) 40 MG tablet Take 40 mg by mouth daily.   Yes Historical Provider, MD   Scheduled Meds:  Continuous Infusions: . sodium chloride 50 mL/hr at 07/30/12 0802   PRN Meds:.  Allergies:  Allergies  Allergen Reactions  . Amitriptyline Hcl Rash  . Aspirin Rash    Swelling  . Zetia (Ezetimibe) Rash    History     Social History  . Marital Status: Widowed    Spouse Name: N/A    Number of Children: 3  . Years of Education: N/A   Occupational History  .     Social History Main Topics  . Smoking status: Never Smoker   . Smokeless tobacco: Former Neurosurgeon    Types: Snuff    Quit date: 05/23/2001  . Alcohol Use: No  . Drug Use: No  . Sexually Active: Not Currently   Other Topics Concern  . Not on file   Social History Narrative  .  patient lives with her daughter.     Family History  Problem Relation Age of Onset  . Heart failure Mother    Family Status  Relation Status Death Age  . Mother Deceased 38    MI  . Father Deceased     Review of Systems: She has struggled because of being without power for 3 days. She has chronic musculoskeletal aches and pains. She has some abdominal tenderness that is not new. She has not been sick recently. Full 14-point review of systems otherwise negative except as noted above.  Physical Exam: Blood pressure 145/64, pulse 79, temperature 98.5 F (36.9 C), temperature source Oral, resp. rate 26, SpO2 94.00%. General: Well developed, well nourished,female in no acute distress. Head: Normocephalic, atraumatic, sclera non-icteric, no xanthomas, nares are without discharge. Dentition:  good  Neck: No carotid bruits - murmur radiates to them. JVD not elevated. No thyromegally Lungs: Good expansion bilaterally. without wheezes or rhonchi.  Heart:slightly IRRegular rate and rhythm with S1 S2.  No S3 or S4.  3/6 murmur, no rubs, or gallops appreciated. Abdomen: Soft, diffusely tender, non-distended with normoactive bowel sounds. No hepatomegaly. No rebound/guarding. No obvious abdominal masses. Msk:  Strength and tone appear a little weak for age. No joint deformities or effusions, no spine or costo-vertebral angle tenderness. Extremities: No clubbing or cyanosis. No edema.  Distal pedal pulses are 2+ in 4 extrem Neuro: Alert and oriented X 3. Moves all  extremities spontaneously. No focal deficits noted. Psych:  Responds to questions appropriately with a normal affect. Skin: No rashes or lesions noted  Labs:   Lab Results  Component Value Date   WBC 10.2 07/30/2012   HGB 12.6 07/30/2012   HCT 37.4 07/30/2012   MCV 92.3 07/30/2012   PLT 189 07/30/2012   No results found for this basename: INR,  in the last 72 hours   Recent Labs Lab 07/30/12 0800  NA 141  K 3.6  CL 105  CO2 24  BUN 12  CREATININE 0.34*  CALCIUM 9.3  PROT 7.0  BILITOT 0.3  ALKPHOS 54  ALT 20  AST 22  GLUCOSE 120*   No results found for this basename: CKTOTAL, CKMB, TROPONINI,  in the last 72 hours  Recent Labs  07/30/12 0804  TROPIPOC 0.00   Radiology/Studies: Dg Chest 2 View 07/30/2012  *RADIOLOGY REPORT*  Clinical Data: Back pain.  CHEST - 2 VIEW  Comparison: 11/12/2011  Findings: Hyperinflation of the lungs.  Biapical and bibasilar scarring.  Prior CABG.  No acute airspace opacities or effusions. Heart is normal size.  No acute bony abnormality.  IMPRESSION: COPD/chronic changes.  No active disease.   Original Report Authenticated By: Charlett Nose, M.D.      Cardiac Cath: None since CABG 2006  Echo: 08/17/2011 Conclusions - Left ventricle: The cavity size was normal. Wall thickness was increased in a pattern of mild LVH. Systolic function was normal. The estimated ejection fraction was in the range of 55% to 60%. Wall motion was normal; there were no regional wall motion abnormalities. Features are consistent with a pseudonormal left ventricular filling pattern, with concomitant abnormal relaxation and increased filling pressure (grade 2 diastolic dysfunction). - Aortic valve: Probably trileaflet; severely calcified leaflets. Mean gradient: 16mm Hg (S). Peak gradient:32mm Hg (S). Valve area: 0.86cm^2(VTI). Visually and by calculated valve area, severe aortic stenosis. Gradient is only mild, however. - Mitral valve: Moderately calcified  annulus. Moderately calcified leaflets . Mild to moderate regurgitation. - Left atrium: The atrium was mildly dilated. - Right ventricle: The cavity size was normal. Systolic function was normal. - Tricuspid valve: Peak RV-RA gradient: 30mm Hg (S). - Pulmonary arteries: PA peak pressure: 35mm Hg (S). - Inferior vena cava: The vessel was normal in size; the respirophasic diameter changes were in the normal range (= 50%); findings are consistent with normal central venous pressure. Impressions: - Normal LV size and systolic function, EF 55-60%. Mild LV hypertrophy. Severe aortic valve calcification. By mean gradient, mild aortic stenosis but by calculated valve area, severe stenosis. Based on the appearance of the valve, I would be concerned that this could potentially be low gradient severe aortic stenosis (this phenomenon can be present even with normal LV systolic function). Normal RV size and systolic function. Mild to moderate MR.  ECG:  SINUS RHYTHM, RATE 86, NO ST ELEVATION OR DEPRESSION NOTED.  ASSESSMENT AND PLAN:  Principal Problem:   Chest pain, mid sternal - admit, r/o MI, add heparin/IV NTG if enzymes elevate. Plan medical therapy for now with no objective evidence of ischemia. If ez negative, consider d/c and f/u as OP.  Otherwise, continue home Rx. Active Problems:   HYPERLIPIDEMIA   CAD, NATIVE VESSEL   AORTIC STENOSIS, CALCIFIC   Signed, Maryagnes Amos 07/30/2012, 12:39 PM  Patient seen and examined.  She has not had heat for the past few days, and been stressed by electrical issues as well.  Developed some chest discomfort, with some atypical and typical features.  She has right sided rib, tender type pain, central pain with some radiation to the back.  She has been somewhat stressed by the situation.  She underwent cath last by me 2006 which led to CABG from which she has done well.  She has SEM and apical systolic murmur as well, but 2-3/6 with no DM.  Last  echo one year ago consistent with moderately severe AS.  See ER ECG.  No acute changes.    Plan overnight observation at this point, but no intervention if stable.   Patient agreeable.

## 2012-07-30 NOTE — ED Provider Notes (Signed)
History     CSN: [id number]  Arrival date & time 07/30/12  0711   First MD Initiated Contact with Patient 07/30/12 [phone]      Chief Complaint  Patient presents with  . Back Pain    (Consider location/radiation/quality/duration/timing/severity/associated sxs/prior treatment) HPI Comments: [name] is a 79 y.o. female w hx of CAD (2003-stent; 2006-CABG, current cardiologist Dr. Jetta Lout), HTN, Strokes, & hiatal hernia presents to the ER c/o acute onset of right sided pain and thoracic pain. Onset of pain was just prior to arrival. Pain began on side associated with nausea and about half an hour later the thoracic pain developed. Pain is described as an "achey pain", rated at 4/10 in severity, 7/10 at its worst this morning. Pain is gradually improving, but constant since onset.  Pt states concern bc her presentation for her last MI was thoracic back pian. Prior to arrival pt took Nitro and Equities trader which did not relieve pain. No known exacerbating or alleviating factors. Pain not positional or exertional (slow ambulation with cane). She describes an epigastric "discomfort" with nausea. Pt denies CP, worsened SOB, leg swelling, unilateral weakness, fall/recent trauma, V/D, abdominal pain, F/NS/Chills.   ASA allergy- Hives  Patient is a 79 y.o. female presenting with back pain. The history is provided by the patient and medical records.  Back Pain Associated symptoms: no abdominal pain, no chest pain ("epigastric discomfort"), no dysuria, no fever and no headaches     Past Medical History  Diagnosis Date  . Arthritis   . Asthma   . GERD (gastroesophageal reflux disease)   . Hyperlipidemia   . Myocardial infarction   . Allergy   . Hypertension   . Eczema   . ASHD (arteriosclerotic heart disease)   . CAD (coronary artery disease) 06/2001  . Bronchitis   . Stroke     Past Surgical History  Procedure Laterality Date  . Abdominal hysterectomy  1976  . Carotid endarterectomy   2010  . Coronary artery bypass graft  01/2002  . Tonsillectomy    . Cardiac surgery  stents  . Tumor removal      Family History  Problem Relation Age of Onset  . Heart failure Mother     History  Substance Use Topics  . Smoking status: Never Smoker   . Smokeless tobacco: Former Neurosurgeon    Types: Snuff    Quit date: 05/23/2001  . Alcohol Use: No    OB History   Grav Para Term Preterm Abortions TAB SAB Ect Mult Living                  Review of Systems  Constitutional: Negative for fever, chills, diaphoresis, fatigue and unexpected weight change.  HENT: Negative for congestion, mouth sores, trouble swallowing, neck pain and neck stiffness.   Eyes: Negative for visual disturbance.  Respiratory: Positive for cough (chronic since 2003) and shortness of breath (chronic COPD). Negative for apnea, chest tightness, wheezing and stridor.   Cardiovascular: Negative for chest pain ("epigastric discomfort"), palpitations and leg swelling.  Gastrointestinal: Positive for nausea. Negative for vomiting, abdominal pain, diarrhea and blood in stool.  Genitourinary: Negative for dysuria, urgency, hematuria and flank pain.  Musculoskeletal: Positive for back pain (thoracic adn right side). Negative for myalgias and gait problem.  Skin: Negative for pallor.  Neurological: Negative for dizziness, syncope, light-headedness and headaches.  All other systems reviewed and are negative.    Allergies  Amitriptyline hcl; Aspirin; and Zetia  Home Medications  Current Outpatient Rx  Name  Route  Sig  Dispense  Refill  . albuterol (PROAIR HFA) 108 (90 BASE) MCG/ACT inhaler   Inhalation   Inhale 2 puffs into the lungs every 4 (four) hours as needed for wheezing.   1 Inhaler   6   . benzonatate (TESSALON) 100 MG capsule   Oral   Take 1 capsule (100 mg total) by mouth 3 (three) times daily as needed. For cough   90 capsule   3   . clopidogrel (PLAVIX) 75 MG tablet   Oral   Take 1 tablet  (75 mg total) by mouth daily.   30 tablet   11   . dexlansoprazole (DEXILANT) 60 MG capsule   Oral   Take 1 capsule (60 mg total) by mouth daily.   30 capsule   5   . diazepam (VALIUM) 2 MG tablet   Oral   Take 2 mg by mouth at bedtime as needed.          . diltiazem (CARDIZEM CD) 120 MG 24 hr capsule   Oral   Take 120 mg by mouth daily.         Marland Kitchen FLOVENT HFA 110 MCG/ACT inhaler      INHALE 1 PUFF BY MOUTH TWICE A DAY   12 g   PRN   . fluticasone (FLONASE) 50 MCG/ACT nasal spray   Nasal   Place 2 sprays into the nose daily.   16 g   11   . glycopyrrolate (ROBINUL) 2 MG tablet   Oral   Take 1 tablet (2 mg total) by mouth daily.   30 tablet   0   . guaiFENesin (MUCINEX) 600 MG 12 hr tablet   Oral   Take 600 mg by mouth 2 (two) times daily.           . isosorbide dinitrate (ISORDIL) 30 MG tablet   Oral   Take 30 mg by mouth 2 (two) times daily.         Marland Kitchen loratadine (CLARITIN) 10 MG tablet   Oral   Take 10 mg by mouth daily.           . metoprolol succinate (TOPROL-XL) 25 MG 24 hr tablet   Oral   Take 1 tablet (25 mg total) by mouth daily.   30 tablet   11   . nitroGLYCERIN (NITROSTAT) 0.4 MG SL tablet   Sublingual   Place 1 tablet (0.4 mg total) under the tongue every 5 (five) minutes as needed. Chest pain   50 tablet   6   . omeprazole (PRILOSEC) 20 MG capsule      take 2 capsules by mouth daily   60 capsule   3   . rosuvastatin (CRESTOR) 40 MG tablet   Oral   Take 1 tablet (40 mg total) by mouth daily.   30 tablet   11   . valsartan (DIOVAN) 40 MG tablet   Oral   Take 40 mg by mouth daily.           BP 157/82  Pulse 85  Temp(Src) 98.5 F (36.9 C) (Oral)  Resp 20  SpO2 99%  Physical Exam  Nursing note and vitals reviewed. Constitutional: She appears well-developed and well-nourished. No distress.  HENT:  Head: Normocephalic and atraumatic.  Eyes: Conjunctivae and EOM are normal. Pupils are equal, round, and reactive  to light.  Neck: Normal range of motion. Neck supple. Normal carotid pulses and no JVD  present. Carotid bruit is not present. No rigidity. Normal range of motion present.  Cardiovascular: Normal rate, regular rhythm, S1 normal, S2 normal, normal heart sounds, intact distal pulses and normal pulses.  Exam reveals no gallop and no friction rub.   No murmur heard. No pitting edema bilaterally, RRR, no aberrant sounds on auscultations, distal pulses intact, no carotid bruit or JVD.   Pulmonary/Chest: No accessory muscle usage or stridor. No respiratory distress. She exhibits no tenderness and no bony tenderness.  Decreased breath sounds in lower fields, normal effort  Abdominal: Bowel sounds are normal.  Soft non tender. Non pulsatile aorta.   Musculoskeletal:  Paraspinal thoracic ttp.   Skin: Skin is warm, dry and intact. No rash noted. She is not diaphoretic. No cyanosis. Nails show no clubbing.    ED Course  Procedures (including critical care time)  Labs Reviewed - No data to display No results found. Discussion with patient in regards to what she anticipates treatment plan will be.  She states that she will not have cardiac stress testing because of the way to be in a mixer feel and that she did not want to stay in the hospital.  Patient reports that she's a followup appointment scheduled with her cardiologist at the end of the month and that she just wanted a quick evaluation to make sure nothing severe was happening.  Plan is once labs have resulted consult cardiologist to evaluate patient emergency department and assist with disposition plan.  Consult cardiology, Adolph Pollack.  Spoke with Rosann Auerbach who is to see patient in the emergency department for further evaluation on whether or not to followup as outpatient versus admit for observation.  Note that in reviewing patient's chart it is found that she has had chronic pain in both neck back and chest.   No diagnosis found.    MDM  Atypical  chest pain, back pain, right rib pain  Patient is an 79 year old female with a history of CAD (MI with stenting and CABG), stroke and hypertension presents emergency department with an atypical presentation of chest back and rib pain.  Patient evaluated as above.  Consult to cardiology who has evaluated patient in the emergency department and has decided to admit for observation.  Plan discussed with patient who is agreeable.  Note this case was discussed and seen with attending Dr. Lynelle Doctor who is agreeable with treatment plan.  Labs and imaging reviewed without any acute abnormalities seen including chest x-ray, troponin, CBC, CMP & UA. No acute abnormalities found on EKG and first round of cardiac enzymes negative.          Jaci Carrel, [place] 07/30/12 1119

## 2012-07-30 NOTE — ED Notes (Addendum)
C/o sudden onset thoracic area pain while sitting on couch. Denies injury. Pt ambulatory at scene, has generalized weakness from previous CVA's. Pt also reports has been really anxious past weekend due eto having no power at house.

## 2012-07-30 NOTE — ED Notes (Signed)
Cardiology PA at the bedside

## 2012-07-30 NOTE — ED Provider Notes (Signed)
Pt presents with thoracic pain.  Somewhat similar to her anginal symptoms.  I have reviewed her cardiology notes in the record.  Pt has chest pain frequently for which she takes ntg.  She also has AS.  This pain today occurred at rest.   Patient has not had a recent stress test. She refuses to get them since she had her bypass because of the reaction she has. We will check enzymes and consult with the cardiology service.   Rate: 86  Rhythm: normal sinus rhythm  QRS Axis: normal  Intervals: First degree AV block  ST/T Wave abnormalities: normal  Conduction Disutrbances:none  Narrative Interpretation: Normal except first degree AV block  Old EKG Reviewed: No significant change since prior EKG   Celene Kras, MD 07/30/12 [phone]

## 2012-07-30 NOTE — ED Notes (Signed)
Lisette, PA at the bedside.  

## 2012-07-30 NOTE — ED Notes (Signed)
Dr. Knapp at the bedside.  

## 2012-07-31 DIAGNOSIS — R072 Precordial pain: Secondary | ICD-10-CM | POA: Diagnosis not present

## 2012-07-31 DIAGNOSIS — I2510 Atherosclerotic heart disease of native coronary artery without angina pectoris: Secondary | ICD-10-CM

## 2012-07-31 DIAGNOSIS — R1013 Epigastric pain: Secondary | ICD-10-CM | POA: Diagnosis not present

## 2012-07-31 LAB — URINE CULTURE
Colony Count: NO GROWTH
Culture: NO GROWTH

## 2012-07-31 LAB — COMPREHENSIVE METABOLIC PANEL
ALT: 17 U/L (ref 0–35)
AST: 19 U/L (ref 0–37)
Calcium: 9.3 mg/dL (ref 8.4–10.5)
Creatinine, Ser: 0.48 mg/dL — ABNORMAL LOW (ref 0.50–1.10)
GFR calc Af Amer: >90 mL/min (ref >90)
GFR calc non Af Amer: 90 mL/min — ABNORMAL LOW (ref >90)
Sodium: 141 mEq/L (ref 135–145)
Total Protein: 6.4 g/dL (ref 6.0–8.3)

## 2012-07-31 LAB — TROPONIN I
Troponin I: <0.3 ng/mL (ref <0.30)
Troponin I: <0.3 ng/mL (ref <0.30)

## 2012-07-31 MED ORDER — POTASSIUM CHLORIDE CRYS ER 20 MEQ PO TBCR
40.0000 meq | EXTENDED_RELEASE_TABLET | Freq: Once | ORAL | Status: AC
  Administered 2012-07-31: 40 meq via ORAL
  Filled 2012-07-31: qty 2

## 2012-07-31 NOTE — Progress Notes (Deleted)
Patient ID: [name],  MRN: [id number], DOB/AGE: 07/25/1928 79 y.o.  Admit date: 07/30/2012 Discharge date: 07/31/2012  Primary Care Provider: LALONDE,JOHN CHARLES Primary Cardiologist: C. McAlhany, MD  Discharge Diagnoses Principal Problem:   Rib lateral rib and mid-scapular back pain Active Problems:   CAD, NATIVE VESSEL   HYPERLIPIDEMIA   AORTIC STENOSIS, CALCIFIC  Allergies Allergies  Allergen Reactions  . Amitriptyline Hcl Rash  . Aspirin Rash    Swelling  . Zetia (Ezetimibe) Rash   Procedures  None  History of Present Illness  79 y/o female with prior h/o CAD who was in her usoh until the morning of admission when she awoke with right lateral rib pain that was worse with deep inspiration and was also associated with nausea. This was followed by mid-scapular back pain that was unrelieved with nitroglycerin.  She called EMS and was taken to the [facility] where ECG was non-acute and initial troponin was normal.  She was placed in observation for further evaluation.  Hospital Course  Pt r/o for MI.  She had no further rib or scapular pain and also no objective evidence of ischemia.  She has been ambulating without difficulty and will be discharged home today in good condition.  Discharge Vitals Blood pressure 101/63, pulse 73, temperature 98.3 F (36.8 C), temperature source Oral, resp. rate 18, height 5' 5" (1.651 m), weight 143 lb 4.8 oz (65 kg), SpO2 95.00%.  Filed Weights   07/30/12 1732 07/31/12 0500  Weight: 143 lb 4.8 oz (65 kg) 143 lb 4.8 oz (65 kg)   Labs  CBC  Recent Labs  07/30/12 0800 07/30/12 1726  WBC 10.2 9.5  NEUTROABS 8.6*  --   HGB 12.6 12.5  HCT 37.4 37.7  MCV 92.3 92.6  PLT 189 195   Basic Metabolic Panel  Recent Labs  07/30/12 0800 07/30/12 1726 07/31/12 0410  NA 141  --  141  K 3.6  --  3.2*  CL 105  --  104  CO2 24  --  28  GLUCOSE 120*  --  101*  BUN 12  --  13  CREATININE 0.34* 0.39* 0.48*  CALCIUM 9.3  --  9.3    Liver Function Tests  Recent Labs  07/30/12 0800 07/31/12 0410  AST 22 19  ALT 20 17  ALKPHOS 54 53  BILITOT 0.3 0.6  PROT 7.0 6.4  ALBUMIN 3.9 3.5    Recent Labs  07/30/12 0800  LIPASE 16   Cardiac Enzymes  Recent Labs  07/30/12 1726 07/30/12 2321 07/31/12 0430  TROPONINI <0.30 <0.30 <0.30   Disposition  Pt is being discharged home today in good condition.  Follow-up Plans & Appointments  Follow-up Information   Follow up with MCALHANY,Shinichi Anguiano, MD On 08/17/2012. (3:15 PM)    Contact information:   [address] [place] [address] [phone]       Follow up with [facility] On 08/17/2012. (2:00 PM - for echocardiogram (ultrasound of heart))    Contact information:   [address] [place] [address] [phone]      Discharge Medications    Medication List    TAKE these medications       albuterol 108 (90 BASE) MCG/ACT inhaler  Commonly known as:  PROAIR HFA  Inhale 2 puffs into the lungs every 4 (four) hours as needed for wheezing.     clopidogrel 75 MG tablet  Commonly known as:  PLAVIX  Take 1   tablet (75 mg total) by mouth daily.     diltiazem 120 MG 24 hr capsule  Commonly known as:  CARDIZEM CD  Take 1 capsule (120 mg total) by mouth daily.     fluticasone 110 MCG/ACT inhaler  Commonly known as:  FLOVENT HFA  Inhale 1 puff into the lungs 2 (two) times daily.     fluticasone 50 MCG/ACT nasal spray  Commonly known as:  FLONASE  Place 2 sprays into the nose daily.     guaiFENesin 600 MG 12 hr tablet  Commonly known as:  MUCINEX  Take 600 mg by mouth 2 (two) times daily.     guaiFENesin 100 MG/5ML liquid  Commonly known as:  ROBITUSSIN  Take 100 mg by mouth 3 (three) times daily as needed for cough.     isosorbide dinitrate 30 MG tablet  Commonly known as:  ISORDIL  Take 30 mg by mouth 2 (two) times daily.     loratadine 10 MG tablet  Commonly known as:  CLARITIN  Take 10 mg by mouth  daily.     metoprolol succinate 25 MG 24 hr tablet  Commonly known as:  TOPROL-XL  Take 1 tablet (25 mg total) by mouth daily.     nitroGLYCERIN 0.4 MG SL tablet  Commonly known as:  NITROSTAT  Place 1 tablet (0.4 mg total) under the tongue every 5 (five) minutes as needed. Chest pain     omeprazole 20 MG capsule  Commonly known as:  PRILOSEC  take 2 capsules by mouth daily     rosuvastatin 40 MG tablet  Commonly known as:  CRESTOR  Take 1 tablet (40 mg total) by mouth daily.     valsartan 40 MG tablet  Commonly known as:  DIOVAN  Take 40 mg by mouth daily.      Outstanding Labs/Studies  Echocardiogram 3/28 @ 3:15 PM.  Duration of Discharge Encounter   Greater than 30 minutes including physician time.  Signed, Aunisty Reali NP 07/31/2012, 11:17 AM    

## 2012-07-31 NOTE — Progress Notes (Signed)
Patient Name: [name] Date of Encounter: 07/31/2012   Principal Problem:   Chest pain, mid sternal Active Problems:   CAD, NATIVE VESSEL   HYPERLIPIDEMIA   AORTIC STENOSIS, CALCIFIC   SUBJECTIVE  No chest pain or sob.  Eager to go home.  CURRENT MEDS . atorvastatin  80 mg Oral q1800  . clopidogrel  75 mg Oral Daily  . diltiazem  120 mg Oral Daily  . enoxaparin (LOVENOX) injection  40 mg Subcutaneous Q24H  . fluticasone  2 spray Each Nare Daily  . irbesartan  75 mg Oral Daily  . loratadine  10 mg Oral Daily  . metoprolol succinate  25 mg Oral Daily  . pantoprazole  40 mg Oral Daily   OBJECTIVE  Filed Vitals:   07/30/12 1732 07/30/12 2100 07/31/12 0500 07/31/12 1028  BP: 143/64 100/58 121/63 101/63  Pulse: 83 76 74 73  Temp: 99.7 F (37.6 C) 99.5 F (37.5 C) 98.3 F (36.8 C)   TempSrc: Oral Oral Oral   Resp: 18 18 18    Height: 5\' 5"  (1.651 m)     Weight: 143 lb 4.8 oz (65 kg)  143 lb 4.8 oz (65 kg)   SpO2: 95% 96% 95%     Intake/Output Summary (Last 24 hours) at 07/31/12 1049 Last data filed at 07/31/12 0900  Gross per 24 hour  Intake    350 ml  Output      0 ml  Net    350 ml   Filed Weights   07/30/12 1732 07/31/12 0500  Weight: 143 lb 4.8 oz (65 kg) 143 lb 4.8 oz (65 kg)   PHYSICAL EXAM  General: Pleasant, NAD. Neuro: Alert and oriented X 3. Moves all extremities spontaneously. Psych: Normal affect. HEENT:  Normal  Neck: Supple without bruits or JVD. Lungs:  Resp regular and unlabored, CTA. Heart: RRR 2/6 sem rusb.  No s3, s4. Abdomen: Soft, non-tender, non-distended, BS + x 4.  Extremities: No clubbing, cyanosis or edema. DP/PT/Radials 2+ and equal bilaterally.  Accessory Clinical Findings  CBC  Recent Labs  07/30/12 0800 07/30/12 1726  WBC 10.2 9.5  NEUTROABS 8.6*  --   HGB 12.6 12.5  HCT 37.4 37.7  MCV 92.3 92.6  PLT 189 195   Basic Metabolic Panel  Recent Labs  07/30/12 0800 07/30/12 1726 07/31/12 0410  NA 141  --   141  K 3.6  --  3.2*  CL 105  --  104  CO2 24  --  28  GLUCOSE 120*  --  101*  BUN 12  --  13  CREATININE 0.34* 0.39* 0.48*  CALCIUM 9.3  --  9.3   Liver Function Tests  Recent Labs  07/30/12 0800 07/31/12 0410  AST 22 19  ALT 20 17  ALKPHOS 54 53  BILITOT 0.3 0.6  PROT 7.0 6.4  ALBUMIN 3.9 3.5    Recent Labs  07/30/12 0800  LIPASE 16   Cardiac Enzymes  Recent Labs  07/30/12 1726 07/30/12 2321 07/31/12 0430  TROPONINI <0.30 <0.30 <0.30   TELE  Sinus, occas block pac's.  ECG  pending  Radiology/Studies  Dg Chest 2 View  07/30/2012  *RADIOLOGY REPORT*  Clinical Data: Back pain.  CHEST - 2 VIEW  Comparison: 11/12/2011  Findings: Hyperinflation of the lungs.  Biapical and bibasilar scarring.  Prior CABG.  No acute airspace opacities or effusions. Heart is normal size.  No acute bony abnormality.  IMPRESSION: COPD/chronic changes.  No active  disease.   Original Report Authenticated By: Charlett Nose, M.D.     ASSESSMENT AND PLAN  1.  Rib and scapular pain: resolved.  No obj evidence of ischemia.  Repeat ecg this AM.  If ok, plan to d/c.  Cont home meds.  2.  Aortic Stenosis:  She is due for f/u echo.  Will arrange as outpt.  3.  HTN:  Stable.  Signed, Nicolasa Ducking NP  I have personally seen and examined this patient with Ward Givens, NP. I agree with the assessment and plan as outlined above. No objective evidence of ischemia. Pain resolved. Will d/c home.   MCALHANY,CHRISTOPHER 11:59 AM 07/31/2012

## 2012-07-31 NOTE — Discharge Summary (Signed)
Patient ID: [name],  MRN: [id number], DOB/AGE: 77/17/1930 77 y.o.  Admit date: 07/30/2012 Discharge date: 07/31/2012  Primary Care Provider: Carollee Herter Primary Cardiologist: C. McAlhany, MD  Discharge Diagnoses Principal Problem:   Rib lateral rib and mid-scapular back pain Active Problems:   CAD, NATIVE VESSEL   HYPERLIPIDEMIA   AORTIC STENOSIS, CALCIFIC  Allergies Allergies  Allergen Reactions  . Amitriptyline Hcl Rash  . Aspirin Rash    Swelling  . Zetia (Ezetimibe) Rash   Procedures  None  History of Present Illness  79 y/o female with prior h/o CAD who was in her usoh until the morning of admission when she awoke with right lateral rib pain that was worse with deep inspiration and was also associated with nausea. This was followed by mid-scapular back pain that was unrelieved with nitroglycerin.  She called EMS and was taken to the [facility] ED where ECG was non-acute and initial troponin was normal.  She was placed in observation for further evaluation.  Hospital Course  Pt r/o for MI.  She had no further rib or scapular pain and also no objective evidence of ischemia.  She has been ambulating without difficulty and will be discharged home today in good condition.  Discharge Vitals Blood pressure 101/63, pulse 73, temperature 98.3 F (36.8 C), temperature source Oral, resp. rate 18, height 5\' 5"  (1.651 m), weight 143 lb 4.8 oz (65 kg), SpO2 95.00%.  Filed Weights   07/30/12 1732 07/31/12 0500  Weight: 143 lb 4.8 oz (65 kg) 143 lb 4.8 oz (65 kg)   Labs  CBC  Recent Labs  07/30/12 0800 07/30/12 1726  WBC 10.2 9.5  NEUTROABS 8.6*  --   HGB 12.6 12.5  HCT 37.4 37.7  MCV 92.3 92.6  PLT 189 195   Basic Metabolic Panel  Recent Labs  07/30/12 0800 07/30/12 1726 07/31/12 0410  NA 141  --  141  K 3.6  --  3.2*  CL 105  --  104  CO2 24  --  28  GLUCOSE 120*  --  101*  BUN 12  --  13  CREATININE 0.34* 0.39* 0.48*  CALCIUM 9.3  --  9.3    Liver Function Tests  Recent Labs  07/30/12 0800 07/31/12 0410  AST 22 19  ALT 20 17  ALKPHOS 54 53  BILITOT 0.3 0.6  PROT 7.0 6.4  ALBUMIN 3.9 3.5    Recent Labs  07/30/12 0800  LIPASE 16   Cardiac Enzymes  Recent Labs  07/30/12 1726 07/30/12 2321 07/31/12 0430  TROPONINI <0.30 <0.30 <0.30   Disposition  Pt is being discharged home today in good condition.  Follow-up Plans & Appointments  Follow-up Information   Follow up with [facility], MD On 08/17/2012. (3:15 PM)    Contact information:   [address] [place] [address] [phone]       Follow up with [facility] On 08/17/2012. (2:00 PM - for echocardiogram (ultrasound of heart))    Contact information:   [address] [place] [address] [phone]      Discharge Medications    Medication List    TAKE these medications       albuterol 108 (90 BASE) MCG/ACT inhaler  Commonly known as:  PROAIR HFA  Inhale 2 puffs into the lungs every 4 (four) hours as needed for wheezing.     clopidogrel 75 MG tablet  Commonly known as:  PLAVIX  Take 1  tablet (75 mg total) by mouth daily.     diltiazem 120 MG 24 hr capsule  Commonly known as:  CARDIZEM CD  Take 1 capsule (120 mg total) by mouth daily.     fluticasone 110 MCG/ACT inhaler  Commonly known as:  FLOVENT HFA  Inhale 1 puff into the lungs 2 (two) times daily.     fluticasone 50 MCG/ACT nasal spray  Commonly known as:  FLONASE  Place 2 sprays into the nose daily.     guaiFENesin 600 MG 12 hr tablet  Commonly known as:  MUCINEX  Take 600 mg by mouth 2 (two) times daily.     guaiFENesin 100 MG/5ML liquid  Commonly known as:  ROBITUSSIN  Take 100 mg by mouth 3 (three) times daily as needed for cough.     isosorbide dinitrate 30 MG tablet  Commonly known as:  ISORDIL  Take 30 mg by mouth 2 (two) times daily.     loratadine 10 MG tablet  Commonly known as:  CLARITIN  Take 10 mg by mouth  daily.     metoprolol succinate 25 MG 24 hr tablet  Commonly known as:  TOPROL-XL  Take 1 tablet (25 mg total) by mouth daily.     nitroGLYCERIN 0.4 MG SL tablet  Commonly known as:  NITROSTAT  Place 1 tablet (0.4 mg total) under the tongue every 5 (five) minutes as needed. Chest pain     omeprazole 20 MG capsule  Commonly known as:  PRILOSEC  take 2 capsules by mouth daily     rosuvastatin 40 MG tablet  Commonly known as:  CRESTOR  Take 1 tablet (40 mg total) by mouth daily.     valsartan 40 MG tablet  Commonly known as:  DIOVAN  Take 40 mg by mouth daily.      Outstanding Labs/Studies  Echocardiogram 3/28 @ 3:15 PM.  Duration of Discharge Encounter   Greater than 30 minutes including physician time.  Signed, Nicolasa Ducking NP 07/31/2012, 11:17 AM

## 2012-08-01 ENCOUNTER — Telehealth: Payer: Self-pay | Admitting: *Deleted

## 2012-08-01 NOTE — Discharge Summary (Signed)
See full note.cdm 

## 2012-08-01 NOTE — Telephone Encounter (Signed)
TCM pt.  Spoke with pt.  States she is doing great.  Has all meds and is aware of appt with Dr. Clifton James on 08/17/2012.

## 2012-08-15 ENCOUNTER — Other Ambulatory Visit: Payer: Self-pay | Admitting: Cardiology

## 2012-08-15 MED ORDER — VALSARTAN 40 MG PO TABS: 40.0000 mg | ORAL_TABLET | Freq: Every day | ORAL | Status: DC

## 2012-08-15 MED ORDER — ISOSORBIDE DINITRATE 30 MG PO TABS: 30.0000 mg | ORAL_TABLET | Freq: Two times a day (BID) | ORAL | Status: DC

## 2012-08-17 ENCOUNTER — Ambulatory Visit (HOSPITAL_COMMUNITY): Payer: Medicare Other | Attending: Family Medicine

## 2012-08-17 ENCOUNTER — Encounter: Payer: Self-pay | Admitting: Cardiovascular Disease

## 2012-08-17 ENCOUNTER — Other Ambulatory Visit (HOSPITAL_COMMUNITY): Payer: Self-pay | Admitting: Cardiovascular Disease

## 2012-08-17 ENCOUNTER — Ambulatory Visit (INDEPENDENT_AMBULATORY_CARE_PROVIDER_SITE_OTHER): Payer: Medicare Other | Admitting: Cardiovascular Disease

## 2012-08-17 VITALS — BP 118/58 | HR 69 | Ht 65.0 in | Wt 139.1 lb

## 2012-08-17 DIAGNOSIS — I6529 Occlusion and stenosis of unspecified carotid artery: Secondary | ICD-10-CM | POA: Insufficient documentation

## 2012-08-17 DIAGNOSIS — I4891 Unspecified atrial fibrillation: Secondary | ICD-10-CM | POA: Insufficient documentation

## 2012-08-17 DIAGNOSIS — I10 Essential (primary) hypertension: Secondary | ICD-10-CM | POA: Insufficient documentation

## 2012-08-17 DIAGNOSIS — I25810 Atherosclerosis of coronary artery bypass graft(s) without angina pectoris: Secondary | ICD-10-CM

## 2012-08-17 DIAGNOSIS — J45909 Unspecified asthma, uncomplicated: Secondary | ICD-10-CM | POA: Insufficient documentation

## 2012-08-17 DIAGNOSIS — I359 Nonrheumatic aortic valve disorder, unspecified: Secondary | ICD-10-CM

## 2012-08-17 DIAGNOSIS — I350 Nonrheumatic aortic (valve) stenosis: Secondary | ICD-10-CM

## 2012-08-17 DIAGNOSIS — I059 Rheumatic mitral valve disease, unspecified: Secondary | ICD-10-CM | POA: Diagnosis not present

## 2012-08-17 DIAGNOSIS — E785 Hyperlipidemia, unspecified: Secondary | ICD-10-CM | POA: Diagnosis not present

## 2012-08-17 DIAGNOSIS — I340 Nonrheumatic mitral (valve) insufficiency: Secondary | ICD-10-CM

## 2012-08-17 DIAGNOSIS — I779 Disorder of arteries and arterioles, unspecified: Secondary | ICD-10-CM

## 2012-08-17 DIAGNOSIS — M4120 Other idiopathic scoliosis, site unspecified: Secondary | ICD-10-CM | POA: Diagnosis not present

## 2012-08-17 DIAGNOSIS — I2510 Atherosclerotic heart disease of native coronary artery without angina pectoris: Secondary | ICD-10-CM | POA: Insufficient documentation

## 2012-08-17 NOTE — Progress Notes (Signed)
History of Present Illness: 79 yo female with history of CAD status post bypass surgery in September 2006 (Dr. Donata Clay) as well as a Cox-Maze procedure for atrial fibrillation, CVA, carotid artery stenosis s/p R CEA, fibromyalgia, GERD, hiatal hernia, severe kyphoscoloisis, moderate MR, mild AS here today for cardiac follow up. She has been followed in the past by Dr. Gala Romney. Myoview 8/10: EF 70% small lateral infarct no ischemia. I saw her in February 2013 and she had c/o occasional chest pains which were atypical. Echo 08/17/11 with normal LV size and function with LVEF of 55%, moderate AS (mean gradient ) with heavily calcified AV, mild to moderate MR. Carotid artery dopplers April 2013 with 40-59% bilateral stenosis with stable RCEA site. She was seen in the ED on 11/12/11 and had negative cardiac markers and no EKG changes. Admitted to [facility] 07/30/12 with lateral wall rib pain. Cardiac markers negative.   She is here today for follow up.  She is feeling well. No chest pains or SOB. Lateral chest wall pain has resolved. No dizziness, near syncope or syncope.   Primary Care Physician: Sharlot Gowda  Last Lipid Profile:Lipid Panel     Component Value Date/Time   CHOL 161 12/05/2011 1553   TRIG 133 12/05/2011 1553   HDL 58 12/05/2011 1553   CHOLHDL 2.8 12/05/2011 1553   VLDL 27 12/05/2011 1553   LDLCALC 76 12/05/2011 1553     Past Medical History  Diagnosis Date  . Arthritis   . Asthma   . GERD (gastroesophageal reflux disease)   . Hyperlipidemia   . Myocardial infarction 2003    Stent to CFX  . Allergy   . Hypertension   . Eczema   . ASHD (arteriosclerotic heart disease)   . CAD (coronary artery disease) 06/2001  . Bronchitis   . Stroke     Past Surgical History  Procedure Laterality Date  . Abdominal hysterectomy  1976  . Carotid endarterectomy  2010  . Coronary artery bypass graft  01/2005    LIMA-D1; SVG-LAD; SVG-OM; SVG-PDA  . Tonsillectomy    . Cardiac surgery   stents  . Tumor removal      Current Outpatient Prescriptions  Medication Sig Dispense Refill  . albuterol (PROAIR HFA) 108 (90 BASE) MCG/ACT inhaler Inhale 2 puffs into the lungs every 4 (four) hours as needed for wheezing.  1 Inhaler  6  . clopidogrel (PLAVIX) 75 MG tablet Take 1 tablet (75 mg total) by mouth daily.  30 tablet  11  . diltiazem (CARDIZEM CD) 120 MG 24 hr capsule Take 1 capsule (120 mg total) by mouth daily.  30 capsule  6  . fluticasone (FLONASE) 50 MCG/ACT nasal spray Place 2 sprays into the nose daily.  16 g  11  . fluticasone (FLOVENT HFA) 110 MCG/ACT inhaler Inhale 1 puff into the lungs 2 (two) times daily.      Marland Kitchen guaiFENesin (MUCINEX) 600 MG 12 hr tablet Take 600 mg by mouth 2 (two) times daily.        Marland Kitchen guaiFENesin (ROBITUSSIN) 100 MG/5ML liquid Take 100 mg by mouth 3 (three) times daily as needed for cough.      . isosorbide dinitrate (ISORDIL) 30 MG tablet Take 1 tablet (30 mg total) by mouth 2 (two) times daily.  60 tablet  5  . loratadine (CLARITIN) 10 MG tablet Take 10 mg by mouth daily.        . metoprolol succinate (TOPROL-XL) 25 MG 24 hr tablet  Take 1 tablet (25 mg total) by mouth daily.  30 tablet  11  . nitroGLYCERIN (NITROSTAT) 0.4 MG SL tablet Place 1 tablet (0.4 mg total) under the tongue every 5 (five) minutes as needed. Chest pain  50 tablet  6  . omeprazole (PRILOSEC) 20 MG capsule take 2 capsules by mouth daily  60 capsule  3  . rosuvastatin (CRESTOR) 40 MG tablet Take 1 tablet (40 mg total) by mouth daily.  30 tablet  11  . valsartan (DIOVAN) 40 MG tablet Take 1 tablet (40 mg total) by mouth daily.  30 tablet  5   No current facility-administered medications for this visit.    Allergies  Allergen Reactions  . Amitriptyline Hcl Rash  . Aspirin Rash    Swelling  . Zetia (Ezetimibe) Rash    History   Social History  . Marital Status: Widowed    Spouse Name: N/A    Number of Children: 3  . Years of Education: N/A   Occupational History  .      Social History Main Topics  . Smoking status: Never Smoker   . Smokeless tobacco: Former Neurosurgeon    Types: Snuff    Quit date: 05/23/2001  . Alcohol Use: No  . Drug Use: No  . Sexually Active: Not Currently   Other Topics Concern  . Not on file   Social History Narrative  . No narrative on file    Family History  Problem Relation Age of Onset  . Heart failure Mother     Review of Systems:  As stated in the HPI and otherwise negative.   BP 118/58  Pulse 69  Ht 5\' 5"  (1.651 m)  Wt 139 lb 1.9 oz (63.104 kg)  BMI 23.15 kg/m2  SpO2 98%  Physical Examination: General: Well developed, well nourished, NAD HEENT: OP clear, mucus membranes moist SKIN: warm, dry. No rashes. Neuro: No focal deficits Musculoskeletal: Muscle strength 5/5 all ext Psychiatric: Mood and affect normal Neck: No JVD, no carotid bruits, no thyromegaly, no lymphadenopathy. Lungs:Clear bilaterally, no wheezes, rhonci, crackles Cardiovascular: Regular rate and rhythm. Loud harsh systolic murmur. No gallops or rubs. Abdomen:Soft. Bowel sounds present. Non-tender.  Extremities: No lower extremity edema. Pulses are 2 + in the bilateral DP/PT.  Echo 08/17/12: Left ventricle: The cavity size was normal. Wall thickness was increased in a pattern of mild LVH. Systolic function was normal. The estimated ejection fraction was in the range of 60% to 65%. Wall motion was normal; there were no regional wall motion abnormalities. Features are consistent with a pseudonormal left ventricular filling pattern, with concomitant abnormal relaxation and increased filling pressure (grade 2 diastolic dysfunction). E/medial e' > 15 suggests LV end diastolic pressure at least 20 mmHg. - Aortic valve: Trileaflet; severely calcified leaflets. There was moderate to severe stenosis. Mean gradient: 28mm Hg (S). Peak gradient: 41mm Hg (S). Valve area: 0.86cm^2(VTI). - Mitral valve: Mildly to moderately calcified  annulus. Mildly calcified leaflets . Mild to moderate regurgitation. - Left atrium: The atrium was mildly dilated. - Right ventricle: The cavity size was normal. Systolic function was normal. - Tricuspid valve: Peak RV-RA gradient: 25mm Hg (S). - Pulmonary arteries: PA peak pressure: 30mm Hg (S). - Inferior vena cava: The vessel was normal in size; the respirophasic diameter changes were in the normal range (= 50%); findings are consistent with normal central venous pressure. Impressions:  - Normal LV size with mild LV hypertrophy. EF 60-65%. Moderate diastolic dysfunction with elevated LV end  diastolic pressure. Normal RV size and systolic function. Moderate to severe aortic stenosis: moderate by mean gradient, severe by calculated valve area.Mild to moderate MR.  Assessment and Plan:   1. Carotid artery disease:  Stable moderate bilateral disease with patent right CEA site and 40-59% bilateral ICA stenosis. Repeat dopplers are due but she is refusing repeat testing at this time as "I am too old for more surgery". Will discuss at f/u appt.      2. Mitral regurgitation: Mild to moderate by echo today. Will folllow.     3. CAD: She is s/p bypass and doing well. She has chronic, stable angina. I have instructed her to let me know if her chest pain changes in severity or frequency. Continue current meds. She is intolerant to ASA but takes clopidogrel daily. Lipids and BP well controlled.     4. AORTIC STENOSIS:  Moderately severe by echo today. NO symptoms. I will see her back in 6 months. Will discuss repeat echo at that time. She is aware of the symptoms that she should be looking for. Will call if her clinical status changes.

## 2012-08-17 NOTE — Patient Instructions (Addendum)
Your physician wants you to follow-up in:  6 months. You will receive a reminder letter in the mail two months in advance. If you don't receive a letter, please call our office to schedule the follow-up appointment.   

## 2012-08-17 NOTE — Progress Notes (Signed)
Echocardiogram performed.  

## 2012-09-14 ENCOUNTER — Telehealth: Payer: Self-pay | Admitting: Pulmonary Disease

## 2012-09-14 NOTE — Telephone Encounter (Signed)
Called pt to schd follow up apt. Pt does not want to schd a appointment at this time. Will call us back when she is ready

## 2012-09-24 ENCOUNTER — Telehealth: Payer: Self-pay | Admitting: Family Medicine

## 2012-09-24 NOTE — Telephone Encounter (Signed)
ERROR

## 2012-10-25 ENCOUNTER — Other Ambulatory Visit: Payer: Self-pay | Admitting: Family Medicine

## 2012-11-21 ENCOUNTER — Other Ambulatory Visit: Payer: Self-pay | Admitting: Family Medicine

## 2012-11-27 ENCOUNTER — Other Ambulatory Visit: Payer: Self-pay | Admitting: Family Medicine

## 2012-12-24 ENCOUNTER — Other Ambulatory Visit: Payer: Self-pay | Admitting: Family Medicine

## 2012-12-25 ENCOUNTER — Ambulatory Visit (INDEPENDENT_AMBULATORY_CARE_PROVIDER_SITE_OTHER): Payer: Medicare Other | Admitting: Family Medicine

## 2012-12-25 VITALS — BP 114/72 | HR 64 | Wt 146.0 lb

## 2012-12-25 DIAGNOSIS — M25519 Pain in unspecified shoulder: Secondary | ICD-10-CM

## 2012-12-25 DIAGNOSIS — R4789 Other speech disturbances: Secondary | ICD-10-CM | POA: Diagnosis not present

## 2012-12-25 DIAGNOSIS — R4702 Dysphasia: Secondary | ICD-10-CM

## 2012-12-25 MED ORDER — FLUTICASONE PROPIONATE 50 MCG/ACT NA SUSP: NASAL | Status: DC

## 2012-12-25 MED ORDER — DIAZEPAM 2 MG PO TABS: 2.0000 mg | ORAL_TABLET | Freq: Four times a day (QID) | ORAL | Status: DC | PRN

## 2012-12-25 MED ORDER — TRAMADOL HCL 50 MG PO TABS: 50.0000 mg | ORAL_TABLET | Freq: Three times a day (TID) | ORAL | Status: DC | PRN

## 2012-12-25 NOTE — Progress Notes (Signed)
  Subjective:    Patient ID: [name], female    DOB: [birth year], 79 y.o.   MRN: [id number]  HPI She is here for multiple reasons. Again very difficult to get her stay on task however her concern is of her swallowing. She again complains of difficulty with swallowing stating liquids go down fairly easily but solids are much more difficulty. She has had previous evaluation including GI as well as barium swallow. She states that the symptoms have gotten worse. She also complains of bilateral shoulder pain as well as generalized pain in her arms and in her hands. She was seen by Dr. Amanda Pea again in December and he apparently gave her a shot. She states that x-rays were done which did show arthritis. She apparently is scheduled for a cardiac procedure and would like to wait before further evaluation of her shoulder because of this.  Review of Systems     Objective:   Physical Exam Alert and in no distress. Exam of her throat shows no lesions. Neck exam shows no adenopathy or thyromegaly. Shoulder exam not done.       Assessment & Plan:  Pain in joint, shoulder region, unspecified laterality - Plan: traMADol (ULTRAM) 50 MG tablet  Dysphasia - Plan: DG Esophagus  her main concern today was pain relief. I will try her on tramadol which she has apparently tried in the past. Also order another barium swallow and possibly refer back to Dr. Ezzard Standing for her dysphasia. She we'll set up an appointment with her orthopedic surgeon after the cardiac issue is resolved.

## 2013-01-01 ENCOUNTER — Other Ambulatory Visit: Payer: Medicare Other

## 2013-01-03 ENCOUNTER — Ambulatory Visit
Admission: RE | Admit: 2013-01-03 | Discharge: 2013-01-03 | Disposition: A | Discharge status: Home / Self Care | Payer: Medicare Other | Source: Ambulatory Visit | Attending: Family Medicine | Admitting: Family Medicine

## 2013-01-03 DIAGNOSIS — K228 Other specified diseases of esophagus: Secondary | ICD-10-CM | POA: Diagnosis not present

## 2013-01-03 DIAGNOSIS — R4702 Dysphasia: Secondary | ICD-10-CM

## 2013-01-03 NOTE — Progress Notes (Signed)
Quick Note:  PT HAS ALREADY TALKED WITH GI ALREADY ______

## 2013-01-11 ENCOUNTER — Other Ambulatory Visit: Payer: Self-pay | Admitting: *Deleted

## 2013-01-11 MED ORDER — NITROGLYCERIN 0.4 MG SL SUBL: 0.4000 mg | SUBLINGUAL_TABLET | SUBLINGUAL | Status: DC | PRN

## 2013-01-14 ENCOUNTER — Telehealth: Payer: Self-pay

## 2013-01-14 MED ORDER — CLOPIDOGREL BISULFATE 75 MG PO TABS: 75.0000 mg | ORAL_TABLET | Freq: Every day | ORAL | Status: DC

## 2013-01-14 NOTE — Telephone Encounter (Signed)
refill 

## 2013-01-23 ENCOUNTER — Ambulatory Visit (INDEPENDENT_AMBULATORY_CARE_PROVIDER_SITE_OTHER): Payer: Medicare Other | Admitting: Cardiovascular Disease

## 2013-01-23 ENCOUNTER — Encounter: Payer: Self-pay | Admitting: Cardiovascular Disease

## 2013-01-23 VITALS — BP 134/58 | HR 72 | Ht 65.0 in | Wt 141.0 lb

## 2013-01-23 DIAGNOSIS — I340 Nonrheumatic mitral (valve) insufficiency: Secondary | ICD-10-CM

## 2013-01-23 DIAGNOSIS — I779 Disorder of arteries and arterioles, unspecified: Secondary | ICD-10-CM

## 2013-01-23 DIAGNOSIS — I359 Nonrheumatic aortic valve disorder, unspecified: Secondary | ICD-10-CM

## 2013-01-23 DIAGNOSIS — I059 Rheumatic mitral valve disease, unspecified: Secondary | ICD-10-CM

## 2013-01-23 DIAGNOSIS — I350 Nonrheumatic aortic (valve) stenosis: Secondary | ICD-10-CM

## 2013-01-23 DIAGNOSIS — I2510 Atherosclerotic heart disease of native coronary artery without angina pectoris: Secondary | ICD-10-CM

## 2013-01-23 NOTE — Patient Instructions (Addendum)

## 2013-01-23 NOTE — Progress Notes (Signed)
History of Present Illness: 79 yo female with history of CAD status post bypass surgery in September 2006 (Dr. Donata Clay) as well as a Cox-Maze procedure for atrial fibrillation, CVA, carotid artery stenosis s/p R CEA, fibromyalgia, GERD, hiatal hernia, severe kyphoscoloisis, moderate MR, moderately severe AS here today for cardiac follow up. She has been followed in the past by Dr. Gala Romney. Myoview 8/10: EF 70% small lateral infarct no ischemia. I saw her in February 2013 and she had c/o occasional chest pains which were atypical. Carotid artery dopplers April 2013 with 40-59% bilateral stenosis with stable RCEA site (she has refused repeat testing).  She was seen in the ED on 11/12/11 and had negative cardiac markers and no EKG changes. Admitted to [facility] 07/30/12 with lateral wall rib pain. Cardiac markers negative. Last echo March 2014 with normal LV function, moderately severe AS with mean gradient 28 mmHg, mild MR. She has seen Dr. Loreta Ave with GI and had a swallowing study for "choking sensation". She has seen an ENT as well.   She is here today for follow up. She is feeling well. Stable left chest wall pain, responsive to NTG. Some dizziness and fatigue, SOB. No syncope.   Primary Care Physician: Sharlot Gowda  Last Lipid Profile:Lipid Panel     Component Value Date/Time   CHOL 161 12/05/2011 1553   TRIG 133 12/05/2011 1553   HDL 58 12/05/2011 1553   CHOLHDL 2.8 12/05/2011 1553   VLDL 27 12/05/2011 1553   LDLCALC 76 12/05/2011 1553     Past Medical History  Diagnosis Date  . Arthritis   . Asthma   . GERD (gastroesophageal reflux disease)   . Hyperlipidemia   . Myocardial infarction 2003    Stent to CFX  . Allergy   . Hypertension   . Eczema   . ASHD (arteriosclerotic heart disease)   . CAD (coronary artery disease) 06/2001  . Bronchitis   . Stroke     Past Surgical History  Procedure Laterality Date  . Abdominal hysterectomy  1976  . Carotid endarterectomy  2010  . Coronary  artery bypass graft  01/2005    LIMA-D1; SVG-LAD; SVG-OM; SVG-PDA  . Tonsillectomy    . Cardiac surgery  stents  . Tumor removal      Current Outpatient Prescriptions  Medication Sig Dispense Refill  . albuterol (PROAIR HFA) 108 (90 BASE) MCG/ACT inhaler Inhale 2 puffs into the lungs every 4 (four) hours as needed for wheezing.  1 Inhaler  6  . clopidogrel (PLAVIX) 75 MG tablet Take 1 tablet (75 mg total) by mouth daily.  30 tablet  6  . CRESTOR 40 MG tablet take 1 tablet by mouth once daily  30 tablet  0  . diazepam (VALIUM) 2 MG tablet Take 1 tablet (2 mg total) by mouth every 6 (six) hours as needed for anxiety.  30 tablet  0  . diltiazem (CARDIZEM CD) 120 MG 24 hr capsule Take 1 capsule (120 mg total) by mouth daily.  30 capsule  6  . FLOVENT HFA 110 MCG/ACT inhaler INHALE 1 PUFF BY MOUTH TWICE A DAY  12 g  0  . fluticasone (FLONASE) 50 MCG/ACT nasal spray instill 2 sprays INTO THE NOSE DAILY  16 g  0  . guaiFENesin (MUCINEX) 600 MG 12 hr tablet Take 600 mg by mouth 2 (two) times daily.        Marland Kitchen guaiFENesin (ROBITUSSIN) 100 MG/5ML liquid Take 100 mg by mouth 3 (three) times  daily as needed for cough.      . isosorbide dinitrate (ISORDIL) 30 MG tablet Take 1 tablet (30 mg total) by mouth 2 (two) times daily.  60 tablet  5  . loratadine (CLARITIN) 10 MG tablet Take 10 mg by mouth daily.        . metoprolol succinate (TOPROL-XL) 25 MG 24 hr tablet TAKE 1 TABLET BY MOUTH ONCE DAILY  30 tablet  0  . nitroGLYCERIN (NITROSTAT) 0.4 MG SL tablet Place 1 tablet (0.4 mg total) under the tongue every 5 (five) minutes as needed. Chest pain  50 tablet  6  . omeprazole (PRILOSEC) 20 MG capsule take 2 capsules by mouth daily  60 capsule  3  . traMADol (ULTRAM) 50 MG tablet Take 1 tablet (50 mg total) by mouth every 8 (eight) hours as needed for pain.  30 tablet  0  . valsartan (DIOVAN) 40 MG tablet Take 1 tablet (40 mg total) by mouth daily.  30 tablet  5   No current facility-administered medications  for this visit.    Allergies  Allergen Reactions  . Amitriptyline Hcl Rash  . Aspirin Rash    Swelling  . Zetia [Ezetimibe] Rash    History   Social History  . Marital Status: Widowed    Spouse Name: N/A    Number of Children: 3  . Years of Education: N/A   Occupational History  .     Social History Main Topics  . Smoking status: Never Smoker   . Smokeless tobacco: Former Neurosurgeon    Types: Snuff    Quit date: 05/23/2001  . Alcohol Use: No  . Drug Use: No  . Sexual Activity: Not Currently   Other Topics Concern  . Not on file   Social History Narrative  . No narrative on file    Family History  Problem Relation Age of Onset  . Heart failure Mother     Review of Systems:  As stated in the HPI and otherwise negative.   BP 134/58  Pulse 72  Ht 5\' 5"  (1.651 m)  Wt 141 lb (63.957 kg)  BMI 23.46 kg/m2  Physical Examination: General: Well developed, well nourished, NAD HEENT: OP clear, mucus membranes moist SKIN: warm, dry. No rashes. Neuro: No focal deficits Musculoskeletal: Muscle strength 5/5 all ext Psychiatric: Mood and affect normal Neck: No JVD, no carotid bruits, no thyromegaly, no lymphadenopathy. Lungs:Clear bilaterally, no wheezes, rhonci, crackles Cardiovascular: Regular rate and rhythm. No murmurs, gallops or rubs. Abdomen:Soft. Bowel sounds present. Non-tender.  Extremities: No lower extremity edema. Pulses are 2 + in the bilateral DP/PT.  Echo March 2014: Left ventricle: The cavity size was normal. Wall thickness was increased in a pattern of mild LVH. Systolic function was normal. The estimated ejection fraction was in the range of 60% to 65%. Wall motion was normal; there were no regional wall motion abnormalities. Features are consistent with a pseudonormal left ventricular filling pattern, with concomitant abnormal relaxation and increased filling pressure (grade 2 diastolic dysfunction). E/medial e' > 15 suggests LV end  diastolic pressure at least 20 mmHg. - Aortic valve: Trileaflet; severely calcified leaflets. There was moderate to severe stenosis. Mean gradient: 28mm Hg (S). Peak gradient: 41mm Hg (S). Valve area: 0.86cm^2(VTI). - Mitral valve: Mildly to moderately calcified annulus. Mildly calcified leaflets . Mild to moderate regurgitation. - Left atrium: The atrium was mildly dilated. - Right ventricle: The cavity size was normal. Systolic function was normal. - Tricuspid valve: Peak RV-RA  gradient: 25mm Hg (S). - Pulmonary arteries: PA peak pressure: 30mm Hg (S). - Inferior vena cava: The vessel was normal in size; the respirophasic diameter changes were in the normal range (= 50%); findings are consistent with normal central venous pressure. Impressions:  - Normal LV size with mild LV hypertrophy. EF 60-65%. Moderate diastolic dysfunction with elevated LV end diastolic pressure. Normal RV size and systolic function. Moderate to severe aortic stenosis: moderate by mean gradient, severe by calculated valve area.Mild to moderate MR.  Assessment and Plan:   1. Carotid artery disease: Stable moderate bilateral disease with patent right CEA site and 40-59% bilateral ICA stenosis. Repeat dopplers are due but she is refusing repeat testing at this time as "I am too old for more surgery". Will discuss at f/u appt.   2. Mitral regurgitation: Mild to moderate by echo March 2014. Will folllow.   3. CAD: She is s/p bypass and doing well. She has chronic, stable angina. I have instructed her to let me know if her chest pain changes in severity or frequency. Continue current meds. She is intolerant to ASA but takes clopidogrel daily. Lipids and BP well controlled.   4. AORTIC STENOSIS: Moderately severe by echo March 2014 with mean gradient of 28 mmHg. Now with some dizziness and fatigue. Will repeat echo.

## 2013-01-26 ENCOUNTER — Other Ambulatory Visit: Payer: Self-pay | Admitting: Family Medicine

## 2013-02-12 ENCOUNTER — Other Ambulatory Visit: Payer: Self-pay

## 2013-02-12 MED ORDER — VALSARTAN 40 MG PO TABS: 40.0000 mg | ORAL_TABLET | Freq: Every day | ORAL | Status: DC

## 2013-02-12 MED ORDER — ISOSORBIDE DINITRATE 30 MG PO TABS: 30.0000 mg | ORAL_TABLET | Freq: Two times a day (BID) | ORAL | Status: DC

## 2013-02-15 ENCOUNTER — Other Ambulatory Visit (HOSPITAL_COMMUNITY): Payer: Medicare Other

## 2013-02-15 ENCOUNTER — Ambulatory Visit (HOSPITAL_COMMUNITY): Payer: Medicare Other | Attending: Cardiovascular Disease

## 2013-02-15 DIAGNOSIS — I359 Nonrheumatic aortic valve disorder, unspecified: Secondary | ICD-10-CM

## 2013-02-15 DIAGNOSIS — I379 Nonrheumatic pulmonary valve disorder, unspecified: Secondary | ICD-10-CM | POA: Diagnosis not present

## 2013-02-15 DIAGNOSIS — I350 Nonrheumatic aortic (valve) stenosis: Secondary | ICD-10-CM

## 2013-02-15 DIAGNOSIS — I059 Rheumatic mitral valve disease, unspecified: Secondary | ICD-10-CM | POA: Insufficient documentation

## 2013-02-15 DIAGNOSIS — I079 Rheumatic tricuspid valve disease, unspecified: Secondary | ICD-10-CM | POA: Insufficient documentation

## 2013-02-15 DIAGNOSIS — I252 Old myocardial infarction: Secondary | ICD-10-CM | POA: Insufficient documentation

## 2013-02-15 NOTE — Progress Notes (Signed)
Echocardiogram performed.  

## 2013-02-21 ENCOUNTER — Telehealth: Payer: Self-pay | Admitting: Cardiovascular Disease

## 2013-02-21 NOTE — Telephone Encounter (Signed)
Pt would like a copy of echo mailed to her Copy mailed. Mylo Red RN

## 2013-02-21 NOTE — Telephone Encounter (Signed)
New problem   Pt want results of echocardiogram.

## 2013-02-26 ENCOUNTER — Other Ambulatory Visit: Payer: Self-pay | Admitting: Family Medicine

## 2013-02-26 ENCOUNTER — Other Ambulatory Visit: Payer: Self-pay | Admitting: *Deleted

## 2013-02-26 MED ORDER — DILTIAZEM HCL ER COATED BEADS 120 MG PO CP24: 120.0000 mg | ORAL_CAPSULE | Freq: Every day | ORAL | Status: DC

## 2013-03-14 ENCOUNTER — Other Ambulatory Visit (INDEPENDENT_AMBULATORY_CARE_PROVIDER_SITE_OTHER): Payer: Medicare Other

## 2013-03-14 DIAGNOSIS — Z23 Encounter for immunization: Secondary | ICD-10-CM | POA: Diagnosis not present

## 2013-04-15 ENCOUNTER — Other Ambulatory Visit: Payer: Self-pay | Admitting: Family Medicine

## 2013-04-24 ENCOUNTER — Other Ambulatory Visit: Payer: Self-pay | Admitting: Family Medicine

## 2013-04-24 ENCOUNTER — Other Ambulatory Visit: Payer: Self-pay | Admitting: Pulmonary Disease

## 2013-06-04 ENCOUNTER — Encounter: Payer: Self-pay | Admitting: Family Medicine

## 2013-06-04 ENCOUNTER — Ambulatory Visit (INDEPENDENT_AMBULATORY_CARE_PROVIDER_SITE_OTHER): Payer: Medicare Other | Admitting: Family Medicine

## 2013-06-04 VITALS — BP 138/72 | HR 62 | Wt 139.0 lb

## 2013-06-04 DIAGNOSIS — E785 Hyperlipidemia, unspecified: Secondary | ICD-10-CM | POA: Diagnosis not present

## 2013-06-04 DIAGNOSIS — K219 Gastro-esophageal reflux disease without esophagitis: Secondary | ICD-10-CM

## 2013-06-04 DIAGNOSIS — J309 Allergic rhinitis, unspecified: Secondary | ICD-10-CM | POA: Diagnosis not present

## 2013-06-04 DIAGNOSIS — R42 Dizziness and giddiness: Secondary | ICD-10-CM

## 2013-06-04 DIAGNOSIS — R059 Cough, unspecified: Secondary | ICD-10-CM | POA: Diagnosis not present

## 2013-06-04 DIAGNOSIS — Z79899 Other long term (current) drug therapy: Secondary | ICD-10-CM

## 2013-06-04 DIAGNOSIS — J45909 Unspecified asthma, uncomplicated: Secondary | ICD-10-CM | POA: Diagnosis not present

## 2013-06-04 DIAGNOSIS — M1990 Unspecified osteoarthritis, unspecified site: Secondary | ICD-10-CM

## 2013-06-04 DIAGNOSIS — R05 Cough: Secondary | ICD-10-CM

## 2013-06-04 DIAGNOSIS — J209 Acute bronchitis, unspecified: Secondary | ICD-10-CM

## 2013-06-04 DIAGNOSIS — M129 Arthropathy, unspecified: Secondary | ICD-10-CM

## 2013-06-04 LAB — LIPID PANEL
Cholesterol: 149 mg/dL (ref 0–200)
HDL: 60 mg/dL (ref >39)
LDL Cholesterol: 67 mg/dL (ref 0–99)
Total CHOL/HDL Ratio: 2.5 Ratio
Triglycerides: 108 mg/dL (ref <150)
VLDL: 22 mg/dL (ref 0–40)

## 2013-06-04 LAB — CBC WITH DIFFERENTIAL/PLATELET
BASOS PCT: 0 % (ref 0–1)
Basophils Absolute: 0 10*3/uL (ref 0.0–0.1)
EOS PCT: 1 % (ref 0–5)
Eosinophils Absolute: 0.1 10*3/uL (ref 0.0–0.7)
HEMATOCRIT: 34.4 % — AB (ref 36.0–46.0)
Hemoglobin: 11.3 g/dL — ABNORMAL LOW (ref 12.0–15.0)
LYMPHS PCT: 14 % (ref 12–46)
Lymphs Abs: 1.2 10*3/uL (ref 0.7–4.0)
MCH: 29.9 pg (ref 26.0–34.0)
MCHC: 32.8 g/dL (ref 30.0–36.0)
MCV: 91 fL (ref 78.0–100.0)
MONO ABS: 0.6 10*3/uL (ref 0.1–1.0)
Monocytes Relative: 7 % (ref 3–12)
Neutro Abs: 6.5 10*3/uL (ref 1.7–7.7)
Neutrophils Relative %: 78 % — ABNORMAL HIGH (ref 43–77)
PLATELETS: 205 10*3/uL (ref 150–400)
RBC: 3.78 MIL/uL — ABNORMAL LOW (ref 3.87–5.11)
RDW: 13.8 % (ref 11.5–15.5)
WBC: 8.4 10*3/uL (ref 4.0–10.5)

## 2013-06-04 LAB — COMPREHENSIVE METABOLIC PANEL
ALT: 21 U/L (ref 0–35)
AST: 22 U/L (ref 0–37)
Albumin: 4.2 g/dL (ref 3.5–5.2)
Alkaline Phosphatase: 54 U/L (ref 39–117)
BILIRUBIN TOTAL: 0.5 mg/dL (ref 0.3–1.2)
BUN: 12 mg/dL (ref 6–23)
CALCIUM: 9.4 mg/dL (ref 8.4–10.5)
CHLORIDE: 107 meq/L (ref 96–112)
CO2: 28 mEq/L (ref 19–32)
Creat: 0.43 mg/dL — ABNORMAL LOW (ref 0.50–1.10)
Glucose, Bld: 94 mg/dL (ref 70–99)
Potassium: 3.9 mEq/L (ref 3.5–5.3)
Sodium: 140 mEq/L (ref 135–145)
Total Protein: 6.6 g/dL (ref 6.0–8.3)

## 2013-06-04 MED ORDER — OMEPRAZOLE 20 MG PO CPDR: DELAYED_RELEASE_CAPSULE | ORAL | Status: DC

## 2013-06-04 MED ORDER — ROSUVASTATIN CALCIUM 40 MG PO TABS: ORAL_TABLET | ORAL | Status: DC

## 2013-06-04 MED ORDER — ACETAMINOPHEN 325 MG PO TABS
650.0000 mg | ORAL_TABLET | ORAL | Status: DC | PRN
Start: 2013-06-04 — End: 2014-01-06

## 2013-06-04 MED ORDER — METOPROLOL SUCCINATE ER 25 MG PO TB24: ORAL_TABLET | ORAL | Status: DC

## 2013-06-04 MED ORDER — AMOXICILLIN 875 MG PO TABS: 875.0000 mg | ORAL_TABLET | Freq: Two times a day (BID) | ORAL | Status: DC

## 2013-06-04 MED ORDER — DIAZEPAM 2 MG PO TABS: 2.0000 mg | ORAL_TABLET | Freq: Four times a day (QID) | ORAL | Status: DC | PRN

## 2013-06-04 MED ORDER — TRAMADOL HCL 50 MG PO TABS: ORAL_TABLET | ORAL | Status: DC

## 2013-06-04 MED ORDER — FLUTICASONE PROPIONATE HFA 110 MCG/ACT IN AERO: INHALATION_SPRAY | RESPIRATORY_TRACT | Status: DC

## 2013-06-04 MED ORDER — ALBUTEROL SULFATE HFA 108 (90 BASE) MCG/ACT IN AERS: 2.0000 | INHALATION_SPRAY | RESPIRATORY_TRACT | Status: DC | PRN

## 2013-06-04 MED ORDER — FLUTICASONE PROPIONATE 50 MCG/ACT NA SUSP: NASAL | Status: DC

## 2013-06-04 NOTE — Progress Notes (Signed)
   Subjective:    Patient ID: [name], female    DOB: 28-Aug-1928, 80 y.o.   MRN: [id number]  HPI She is here for evaluation of a one-week history of continued difficulty with cough and now productive cough but no fever, chills, sore throat or earache. He was very difficult for her to stay on task concerning this and all of her other medical issues. She does have underlying allergies and needs a refill on her Flovent. Review of record indicates she has multiple reasons for having a chronic cough. She was seen by pulmonology last her concerning this. She continues on Crestor and needs a refill on this. She also would like a refill of her diazepam which he uses for her chronic vertigo. She uses this when necessary. She also has arthritic type pain although it again it was quite difficult to get her to stay focused on exactly where all her pain was. Apparently 50 mg Amytal is not successful., All is not helpful and end-stage apparently not useful and there are other relative contraindications to them.   Review of Systems     Objective:   Physical Exam alert and in no distress. Tympanic membranes and canals are normal. Throat is clear. Tonsils are normal. Neck is supple without adenopathy or thyromegaly. Cardiac exam shows a regular sinus rhythm without murmurs or gallops. Lungs are clear to auscultation.        Assessment & Plan:  ALLERGIC RHINITIS - Plan: fluticasone (FLOVENT HFA) 110 MCG/ACT inhaler, fluticasone (FLONASE) 50 MCG/ACT nasal spray  ASTHMA, PERSISTENT, MILD - Plan: albuterol (PROAIR HFA) 108 (90 BASE) MCG/ACT inhaler  Hyperlipidemia - Plan: rosuvastatin (CRESTOR) 40 MG tablet, Lipid panel  GERD (gastroesophageal reflux disease) - Plan: omeprazole (PRILOSEC) 20 MG capsule  Cough - Plan: CBC with Differential, Comprehensive metabolic panel  Acute bronchitis - Plan: amoxicillin (AMOXIL) 875 MG tablet  Arthritis - Plan: traMADol (ULTRAM) 50 MG tablet, acetaminophen  (TYLENOL) tablet 650 mg  Vertigo - Plan: diazepam (VALIUM) 2 MG tablet  Encounter for long-term (current) use of other medications - Plan: CBC with Differential, Comprehensive metabolic panel, Lipid panel  this is a very difficult lady to take care of because she tends to ramble and gives vague responses to any question. Her coughing is multifactorial but I feel it is safe to place her on an antibiotic to see if this will help. I tried to explain the fact that her cough can be from multiple causes. I will use several of her medications and get blood work. Have her increase her tramadol to 100 mg to see if this will help with her vague aches and pains that are probably arthritis in nature.

## 2013-06-14 ENCOUNTER — Other Ambulatory Visit (INDEPENDENT_AMBULATORY_CARE_PROVIDER_SITE_OTHER): Payer: Medicare Other

## 2013-06-14 DIAGNOSIS — Z23 Encounter for immunization: Secondary | ICD-10-CM | POA: Diagnosis not present

## 2013-06-14 NOTE — Progress Notes (Signed)
I spoke with Lori Ogle PA-C that I had a patient here wanting the pneumonia vaccine. I told him that she had Pneumonia 23 vaccine 12/2008. He states that you are given the Pneumonia vaccine every 5 years but she could go ahead and get the vaccine but make her aware that insurance may not pay since it has not been 5 years yet. I explain this to the patient and told her she may be billed $290.00 if insurance does not pay. Patient states that she wanted the vaccine and that she would pay. CLS

## 2013-06-21 ENCOUNTER — Telehealth: Payer: Self-pay | Admitting: Family Medicine

## 2013-06-21 NOTE — Telephone Encounter (Signed)
Pt aware.

## 2013-06-21 NOTE — Telephone Encounter (Signed)
Tell her that it is not unusual to get like that but should clear up. If he gets worse have her call me back

## 2013-07-26 ENCOUNTER — Encounter: Payer: Self-pay | Admitting: Cardiovascular Disease

## 2013-07-26 ENCOUNTER — Ambulatory Visit (INDEPENDENT_AMBULATORY_CARE_PROVIDER_SITE_OTHER): Payer: Medicare Other | Admitting: Cardiovascular Disease

## 2013-07-26 VITALS — BP 139/76 | HR 75 | Ht 65.0 in | Wt 146.1 lb

## 2013-07-26 DIAGNOSIS — I2510 Atherosclerotic heart disease of native coronary artery without angina pectoris: Secondary | ICD-10-CM

## 2013-07-26 DIAGNOSIS — I350 Nonrheumatic aortic (valve) stenosis: Secondary | ICD-10-CM

## 2013-07-26 DIAGNOSIS — I359 Nonrheumatic aortic valve disorder, unspecified: Secondary | ICD-10-CM | POA: Diagnosis not present

## 2013-07-26 MED ORDER — CLOPIDOGREL BISULFATE 75 MG PO TABS: 75.0000 mg | ORAL_TABLET | Freq: Every day | ORAL | Status: DC

## 2013-07-26 MED ORDER — VALSARTAN 40 MG PO TABS: 40.0000 mg | ORAL_TABLET | Freq: Every day | ORAL | Status: DC

## 2013-07-26 MED ORDER — ISOSORBIDE DINITRATE 30 MG PO TABS: 30.0000 mg | ORAL_TABLET | Freq: Two times a day (BID) | ORAL | Status: DC

## 2013-07-26 NOTE — Patient Instructions (Signed)
Your physician wants you to follow-up in:  6 months.  You will receive a reminder letter in the mail two months in advance. If you don't receive a letter, please call our office to schedule the follow-up appointment.  Your physician has requested that you have an echocardiogram. Echocardiography is a painless test that uses sound waves to create images of your heart. It provides your doctor with information about the size and shape of your heart and how well your heart's chambers and valves are working. This procedure takes approximately one hour. There are no restrictions for this procedure. To be done in 6 months--week or so prior to appt with Dr. McAlhany    

## 2013-07-26 NOTE — Progress Notes (Signed)
History of Present Illness: 80 yo female with history of CAD status post bypass surgery in September 2006 (Dr. Prescott Gum) as well as a Cox-Maze procedure for atrial fibrillation, CVA, carotid artery stenosis s/p R CEA, fibromyalgia, GERD, hiatal hernia, severe kyphoscoloisis, moderate MR, moderately severe AS here today for cardiac follow up. She has been followed in the past by Dr. Haroldine Laws. Myoview 8/10: EF 70% small lateral infarct no ischemia. I saw her in February 2013 and she had c/o occasional chest pains which were atypical. Carotid artery dopplers April 2013 with 40-59% bilateral stenosis with stable RCEA site (she has refused repeat testing).  She was seen in the ED on 11/12/11 and had negative cardiac markers and no EKG changes. Admitted to [facility] 07/30/12 with lateral wall rib pain. Cardiac markers negative. Last echo September 2014 with normal LV function, moderately severe AS with mean gradient 28 mmHg, mild MR. She has seen Dr. Collene Mares with GI and had a swallowing study for "choking sensation". She has seen an ENT as well.   She is here today for follow up. She is feeling well. She has stable dyspnea with exertion. She is limited by her joint issus. Only has rare chest pains. No syncope. No lower ext edema.  Primary Care Physician: Jill Alexanders  Last Lipid Profile:Lipid Panel     Component Value Date/Time   CHOL 149 06/04/2013 1033   TRIG 108 06/04/2013 1033   HDL 60 06/04/2013 1033   CHOLHDL 2.5 06/04/2013 1033   VLDL 22 06/04/2013 1033   LDLCALC 67 06/04/2013 1033    Past Medical History  Diagnosis Date  . Arthritis   . Asthma   . GERD (gastroesophageal reflux disease)   . Hyperlipidemia   . Myocardial infarction 2003    Stent to CFX  . Allergy   . Hypertension   . Eczema   . ASHD (arteriosclerotic heart disease)   . CAD (coronary artery disease) 06/2001  . Bronchitis   . Stroke     Past Surgical History  Procedure Laterality Date  . Abdominal hysterectomy  1976  .  Carotid endarterectomy  2010  . Coronary artery bypass graft  01/2005    LIMA-D1; SVG-LAD; SVG-OM; SVG-PDA  . Tonsillectomy    . Cardiac surgery  stents  . Tumor removal      Current Outpatient Prescriptions  Medication Sig Dispense Refill  . albuterol (PROAIR HFA) 108 (90 BASE) MCG/ACT inhaler Inhale 2 puffs into the lungs every 4 (four) hours as needed for wheezing.  1 Inhaler  6  . amoxicillin (AMOXIL) 875 MG tablet Take 1 tablet (875 mg total) by mouth 2 (two) times daily.  20 tablet  0  . clopidogrel (PLAVIX) 75 MG tablet Take 1 tablet (75 mg total) by mouth daily.  30 tablet  6  . diazepam (VALIUM) 2 MG tablet Take 1 tablet (2 mg total) by mouth every 6 (six) hours as needed for anxiety.  30 tablet  1  . diltiazem (CARDIZEM CD) 120 MG 24 hr capsule Take 1 capsule (120 mg total) by mouth daily.  30 capsule  11  . fluticasone (FLONASE) 50 MCG/ACT nasal spray instill 2 sprays into each nostril once daily  16 g  11  . fluticasone (FLOVENT HFA) 110 MCG/ACT inhaler inhale 1 puff by mouth twice a day  12 g  11  . guaiFENesin (MUCINEX) 600 MG 12 hr tablet Take 600 mg by mouth 2 (two) times daily.        Marland Kitchen  guaiFENesin (ROBITUSSIN) 100 MG/5ML liquid Take 100 mg by mouth 3 (three) times daily as needed for cough.      . isosorbide dinitrate (ISORDIL) 30 MG tablet Take 1 tablet (30 mg total) by mouth 2 (two) times daily.  60 tablet  5  . loratadine (CLARITIN) 10 MG tablet Take 10 mg by mouth daily.        . metoprolol succinate (TOPROL-XL) 25 MG 24 hr tablet take 1 tablet by mouth once daily  30 tablet  11  . nitroGLYCERIN (NITROSTAT) 0.4 MG SL tablet Place 1 tablet (0.4 mg total) under the tongue every 5 (five) minutes as needed. Chest pain  50 tablet  6  . omeprazole (PRILOSEC) 20 MG capsule take 2 capsules by mouth once daily  60 capsule  11  . rosuvastatin (CRESTOR) 40 MG tablet take 1 tablet by mouth once daily  30 tablet  11  . traMADol (ULTRAM) 50 MG tablet Take 2 pills 3 times per day for  pain  50 tablet  0  . valsartan (DIOVAN) 40 MG tablet Take 1 tablet (40 mg total) by mouth daily.  30 tablet  5   Current Facility-Administered Medications  Medication Dose Route Frequency Provider Last Rate Last Dose  . acetaminophen (TYLENOL) tablet 650 mg  650 mg Oral Q4H PRN Denita Lung, MD        Allergies  Allergen Reactions  . Amitriptyline Hcl Rash  . Aspirin Rash    Swelling  . Zetia [Ezetimibe] Rash    History   Social History  . Marital Status: Widowed    Spouse Name: N/A    Number of Children: 3  . Years of Education: N/A   Occupational History  .     Social History Main Topics  . Smoking status: Never Smoker   . Smokeless tobacco: Former Systems developer    Types: Snuff    Quit date: 05/23/2001  . Alcohol Use: No  . Drug Use: No  . Sexual Activity: Not Currently   Other Topics Concern  . Not on file   Social History Narrative  . No narrative on file    Family History  Problem Relation Age of Onset  . Heart failure Mother     Review of Systems:  As stated in the HPI and otherwise negative.   BP 139/76  Pulse 75  Ht 5\' 5"  (1.651 m)  Wt 146 lb 1.9 oz (66.28 kg)  BMI 24.32 kg/m2  Physical Examination: General: Well developed, well nourished, NAD HEENT: OP clear, mucus membranes moist SKIN: warm, dry. No rashes. Neuro: No focal deficits Musculoskeletal: Muscle strength 5/5 all ext Psychiatric: Mood and affect normal Neck: No JVD, no carotid bruits, no thyromegaly, no lymphadenopathy. Lungs:Clear bilaterally, no wheezes, rhonci, crackles Cardiovascular: Regular rate and rhythm. Loud systolic murmur. No gallops or rubs. Abdomen:Soft. Bowel sounds present. Non-tender.  Extremities: No lower extremity edema. Pulses are 2 + in the bilateral DP/PT.  Echo 02/15/13: Left ventricle: The cavity size was normal. Wall thickness was increased in a pattern of mild LVH. Systolic function was normal. The estimated ejection fraction was in the range of 60% to  65%. Wall motion was normal; there were no regional wall motion abnormalities. Features are consistent with a pseudonormal left ventricular filling pattern, with concomitant abnormal relaxation and increased filling pressure (grade 2 diastolic dysfunction). E/medial e' > 15, suggesting LV end diastolic pressure at least 20 mmHg. - Aortic valve: Trileaflet; severely calcified leaflets. At least moderate stenosis,  cannot rule out severe. Moderate by mean gradient but severe by calculated valve area. Mean gradient: 18mm Hg (S). Peak gradient: 33mm Hg (S). Valve area: 0.6cm^2(VTI). - Mitral valve: Mildly to moderately calcified annulus. Moderately calcified leaflets . Mild to moderate regurgitation. - Left atrium: The atrium was mildly dilated. - Right ventricle: The cavity size was normal. Systolic function was normal. - Tricuspid valve: Mild-moderate regurgitation. Peak RV-RA gradient: 57mm Hg (S). - Pulmonary arteries: PA peak pressure: 46mm Hg (S). - Inferior vena cava: The vessel was normal in size; the respirophasic diameter changes were in the normal range (= 50%); findings are consistent with normal central venous pressure. Impressions:  - Normal LV size with mild LV hypertrophy. EF 60-65%. Moderate diastolic dysfunction with evidence for elevated LV filling pressure. The aortic valve was heavily calcified with at least moderate and possibly severe aortic stenosis (moderate by mean gradient, severe by calculated valve area). Would consider TEE to further assess the aortic valve. Normal RV size and systolic function. Mild pulmonary hypertension.  EKG: Sinus, 1st degree AV block.   Assessment and Plan:   1. Carotid artery disease: Stable moderate bilateral disease with patent right CEA site and 40-59% bilateral ICA stenosis. Repeat dopplers are due but she is refusing repeat testing at this time as "I am too old for more surgery". Will discuss at f/u appt.   2. Mitral  regurgitation: Mild to moderate by echo September 2014. Will folllow.   3. CAD: She is s/p bypass and doing well. She has chronic, stable angina. I have instructed her to let me know if her chest pain changes in severity or frequency. Continue current meds. She is intolerant to ASA but takes clopidogrel daily. Lipids and BP well controlled.   4. AORTIC STENOSIS: Moderately severe by echo September 2014 with mean gradient of 28 mmHg. At this time, she does not meet criteria for consideration for AVR. She would not be a candidate for open surgical replacement. She could be considered for TAVR in the future. Repeat echo September 2015.

## 2013-09-04 ENCOUNTER — Other Ambulatory Visit: Payer: Self-pay | Admitting: Family Medicine

## 2013-09-04 NOTE — Telephone Encounter (Signed)
Is this okay to refill? 

## 2013-09-05 NOTE — Telephone Encounter (Signed)
Medication called in 

## 2013-10-29 ENCOUNTER — Telehealth: Payer: Self-pay | Admitting: Family Medicine

## 2013-10-29 NOTE — Telephone Encounter (Signed)
Pt's daughter dropped off a parking placard to be filled out. This is a renewal not a new one. Please call natalie at [phone] or [phone] when complete. I am sending this back in your folder.

## 2013-10-29 NOTE — Telephone Encounter (Signed)
Form was completed and pt was called to pick up.

## 2013-12-04 DIAGNOSIS — L02519 Cutaneous abscess of unspecified hand: Secondary | ICD-10-CM | POA: Diagnosis not present

## 2013-12-04 DIAGNOSIS — M25539 Pain in unspecified wrist: Secondary | ICD-10-CM | POA: Diagnosis not present

## 2013-12-04 DIAGNOSIS — M79609 Pain in unspecified limb: Secondary | ICD-10-CM | POA: Diagnosis not present

## 2014-01-06 ENCOUNTER — Encounter: Payer: Self-pay | Admitting: Family Medicine

## 2014-01-06 ENCOUNTER — Ambulatory Visit (INDEPENDENT_AMBULATORY_CARE_PROVIDER_SITE_OTHER): Payer: Medicare Other | Admitting: Family Medicine

## 2014-01-06 VITALS — BP 116/60 | HR 70

## 2014-01-06 DIAGNOSIS — L03818 Cellulitis of other sites: Secondary | ICD-10-CM | POA: Diagnosis not present

## 2014-01-06 DIAGNOSIS — I2510 Atherosclerotic heart disease of native coronary artery without angina pectoris: Secondary | ICD-10-CM

## 2014-01-06 DIAGNOSIS — L02818 Cutaneous abscess of other sites: Secondary | ICD-10-CM

## 2014-01-06 MED ORDER — SULFAMETHOXAZOLE-TMP DS 800-160 MG PO TABS: 1.0000 | ORAL_TABLET | Freq: Two times a day (BID) | ORAL | Status: DC

## 2014-01-06 NOTE — Progress Notes (Signed)
   Subjective:    Patient ID: [name], female    DOB: [birth year], 80 y.o.   MRN: [id number]  HPI She is here for evaluation of continued difficulty with infection of the right index fingernail. She was seen in an urgent care Center in mid July and placed on doxycycline. He apparently has not helped. She states that she has been able to express some pus from it and also will bleed intermittently.   Review of Systems     Objective:   Physical Exam The lateral aspect of the right fingernail does show some slight erythema and granulation tissue. No evidence of abscess is noted.       Assessment & Plan:  Cellulitis of other specified site - Plan: sulfamethoxazole-trimethoprim (BACTRIM DS) 800-160 MG per tablet  I will treat her with a different antibiotic however if this does not work, I will refer to dermatology for more definitive care.

## 2014-01-16 DIAGNOSIS — L03019 Cellulitis of unspecified finger: Secondary | ICD-10-CM | POA: Diagnosis not present

## 2014-01-16 DIAGNOSIS — L608 Other nail disorders: Secondary | ICD-10-CM | POA: Diagnosis not present

## 2014-01-21 ENCOUNTER — Other Ambulatory Visit: Payer: Self-pay | Admitting: *Deleted

## 2014-01-21 MED ORDER — NITROGLYCERIN 0.4 MG SL SUBL: 0.4000 mg | SUBLINGUAL_TABLET | SUBLINGUAL | Status: DC | PRN

## 2014-01-24 ENCOUNTER — Other Ambulatory Visit (HOSPITAL_COMMUNITY): Payer: Medicare Other

## 2014-01-24 ENCOUNTER — Ambulatory Visit (HOSPITAL_COMMUNITY): Payer: Medicare Other | Attending: Cardiology | Admitting: Cardiology

## 2014-01-24 DIAGNOSIS — E785 Hyperlipidemia, unspecified: Secondary | ICD-10-CM | POA: Insufficient documentation

## 2014-01-24 DIAGNOSIS — I359 Nonrheumatic aortic valve disorder, unspecified: Secondary | ICD-10-CM

## 2014-01-24 DIAGNOSIS — I2510 Atherosclerotic heart disease of native coronary artery without angina pectoris: Secondary | ICD-10-CM | POA: Insufficient documentation

## 2014-01-24 DIAGNOSIS — Z951 Presence of aortocoronary bypass graft: Secondary | ICD-10-CM | POA: Insufficient documentation

## 2014-01-24 DIAGNOSIS — I10 Essential (primary) hypertension: Secondary | ICD-10-CM | POA: Diagnosis not present

## 2014-01-24 DIAGNOSIS — I350 Nonrheumatic aortic (valve) stenosis: Secondary | ICD-10-CM

## 2014-01-24 NOTE — Progress Notes (Signed)
Echo performed. 

## 2014-02-07 ENCOUNTER — Encounter: Payer: Self-pay | Admitting: *Deleted

## 2014-02-07 ENCOUNTER — Ambulatory Visit (INDEPENDENT_AMBULATORY_CARE_PROVIDER_SITE_OTHER): Payer: Medicare Other | Admitting: Cardiovascular Disease

## 2014-02-07 ENCOUNTER — Encounter: Payer: Self-pay | Admitting: Cardiovascular Disease

## 2014-02-07 VITALS — BP 124/70 | HR 65 | Ht 65.0 in | Wt 145.8 lb

## 2014-02-07 DIAGNOSIS — I059 Rheumatic mitral valve disease, unspecified: Secondary | ICD-10-CM

## 2014-02-07 DIAGNOSIS — I2510 Atherosclerotic heart disease of native coronary artery without angina pectoris: Secondary | ICD-10-CM | POA: Diagnosis not present

## 2014-02-07 DIAGNOSIS — I779 Disorder of arteries and arterioles, unspecified: Secondary | ICD-10-CM

## 2014-02-07 DIAGNOSIS — I359 Nonrheumatic aortic valve disorder, unspecified: Secondary | ICD-10-CM | POA: Diagnosis not present

## 2014-02-07 DIAGNOSIS — I340 Nonrheumatic mitral (valve) insufficiency: Secondary | ICD-10-CM

## 2014-02-07 DIAGNOSIS — I739 Peripheral vascular disease, unspecified: Secondary | ICD-10-CM

## 2014-02-07 DIAGNOSIS — I350 Nonrheumatic aortic (valve) stenosis: Secondary | ICD-10-CM

## 2014-02-07 DIAGNOSIS — E785 Hyperlipidemia, unspecified: Secondary | ICD-10-CM

## 2014-02-07 MED ORDER — DILTIAZEM HCL ER COATED BEADS 120 MG PO CP24: 120.0000 mg | ORAL_CAPSULE | Freq: Every day | ORAL | Status: DC

## 2014-02-07 NOTE — Patient Instructions (Signed)
Your physician wants you to follow-up in:  6 months. You will receive a reminder letter in the mail two months in advance. If you don't receive a letter, please call our office to schedule the follow-up appointment.  Your physician has requested that you have a carotid duplex. This test is an ultrasound of the carotid arteries in your neck. It looks at blood flow through these arteries that supply the brain with blood. Allow one hour for this exam. There are no restrictions or special instructions.

## 2014-02-07 NOTE — Progress Notes (Signed)
History of Present Illness: 80 yo female with history of CAD status post bypass surgery in September 2006 (Dr. Prescott Gum) as well as a Cox-Maze procedure for atrial fibrillation, CVA, carotid artery stenosis s/p R CEA, fibromyalgia, GERD, hiatal hernia, severe kyphoscoloisis, moderate MR, moderately severe AS here today for cardiac follow up. She has been followed in the past by Dr. Haroldine Laws. Myoview 8/10: EF 70% small lateral infarct no ischemia. I saw her in February 2013 and she had c/o occasional chest pains which were atypical. Carotid artery dopplers April 2013 with 40-59% bilateral stenosis with stable RCEA site (she has refused repeat testing).  She was seen in the ED on 11/12/11 and had negative cardiac markers and no EKG changes. Admitted to [facility] 07/30/12 with lateral wall rib pain. Cardiac markers negative. Last echo September 2014 with normal LV function, moderately severe AS with mean gradient 28 mmHg, mild MR. She has seen Dr. Collene Mares with GI and had a swallowing study for "choking sensation". She has seen an ENT as well.   She is here today for follow up. She is feeling well. She has stable dyspnea with exertion. She is limited by her joint issus. Only has rare chest pains. No syncope. No lower ext edema.  Primary Care Physician: Jill Alexanders  Last Lipid Profile:Lipid Panel     Component Value Date/Time   CHOL 149 06/04/2013 1033   TRIG 108 06/04/2013 1033   HDL 60 06/04/2013 1033   CHOLHDL 2.5 06/04/2013 1033   VLDL 22 06/04/2013 1033   LDLCALC 67 06/04/2013 1033    Past Medical History  Diagnosis Date  . Arthritis   . Asthma   . GERD (gastroesophageal reflux disease)   . Hyperlipidemia   . Myocardial infarction 2003    Stent to CFX  . Allergy   . Hypertension   . Eczema   . ASHD (arteriosclerotic heart disease)   . CAD (coronary artery disease) 06/2001  . Bronchitis   . Stroke     Past Surgical History  Procedure Laterality Date  . Abdominal hysterectomy  1976  .  Carotid endarterectomy  2010  . Coronary artery bypass graft  01/2005    LIMA-D1; SVG-LAD; SVG-OM; SVG-PDA  . Tonsillectomy    . Cardiac surgery  stents  . Tumor removal      Current Outpatient Prescriptions  Medication Sig Dispense Refill  . albuterol (PROAIR HFA) 108 (90 BASE) MCG/ACT inhaler Inhale 2 puffs into the lungs every 4 (four) hours as needed for wheezing.  1 Inhaler  6  . clopidogrel (PLAVIX) 75 MG tablet Take 1 tablet (75 mg total) by mouth daily.  30 tablet  11  . diazepam (VALIUM) 2 MG tablet Take 2 mg by mouth every 6 (six) hours as needed (dizziness).      Marland Kitchen diltiazem (CARDIZEM CD) 120 MG 24 hr capsule Take 1 capsule (120 mg total) by mouth daily.  30 capsule  11  . fluticasone (FLONASE) 50 MCG/ACT nasal spray instill 2 sprays into each nostril once daily  16 g  11  . fluticasone (FLOVENT HFA) 110 MCG/ACT inhaler inhale 1 puff by mouth twice a day  12 g  11  . guaiFENesin (MUCINEX) 600 MG 12 hr tablet Take 600 mg by mouth 2 (two) times daily.        Marland Kitchen guaiFENesin (ROBITUSSIN) 100 MG/5ML liquid Take 200 mg by mouth 3 (three) times daily as needed (Chronic bronchitis).      . isosorbide dinitrate (  ISORDIL) 30 MG tablet Take 1 tablet (30 mg total) by mouth 2 (two) times daily.  60 tablet  11  . loratadine (CLARITIN) 10 MG tablet Take 10 mg by mouth daily.        . metoprolol succinate (TOPROL-XL) 25 MG 24 hr tablet take 1 tablet by mouth once daily  30 tablet  11  . nitroGLYCERIN (NITROSTAT) 0.4 MG SL tablet Place 0.4 mg under the tongue every 5 (five) minutes as needed for chest pain (MAX 3 Tablets).      Marland Kitchen omeprazole (PRILOSEC) 20 MG capsule take 2 capsules by mouth once daily  60 capsule  11  . rosuvastatin (CRESTOR) 40 MG tablet take 1 tablet by mouth once daily  30 tablet  11  . traMADol (ULTRAM) 50 MG tablet TAKE 2 TABLETS BY MOUTH 3 TIMES A DAY AS NEEDED FOR PAIN  50 tablet  5  . valsartan (DIOVAN) 40 MG tablet Take 1 tablet (40 mg total) by mouth daily.  30 tablet  11     No current facility-administered medications for this visit.    Allergies  Allergen Reactions  . Bactrim [Sulfamethoxazole-Tmp Ds] Other (See Comments)    Makes her very strange   . Amitriptyline Hcl Rash  . Aspirin Swelling and Rash  . Zetia [Ezetimibe] Rash    History   Social History  . Marital Status: Widowed    Spouse Name: N/A    Number of Children: 3  . Years of Education: N/A   Occupational History  .     Social History Main Topics  . Smoking status: Never Smoker   . Smokeless tobacco: Former Systems developer    Types: Snuff    Quit date: 05/23/2001  . Alcohol Use: No  . Drug Use: No  . Sexual Activity: Not Currently   Other Topics Concern  . Not on file   Social History Narrative  . No narrative on file    Family History  Problem Relation Age of Onset  . Heart failure Mother     Review of Systems:  As stated in the HPI and otherwise negative.   BP 124/70  Pulse 65  Ht 5\' 5"  (1.651 m)  Wt 145 lb 12.8 oz (66.134 kg)  BMI 24.26 kg/m2  Physical Examination: General: Well developed, well nourished, NAD HEENT: OP clear, mucus membranes moist SKIN: warm, dry. No rashes. Neuro: No focal deficits Musculoskeletal: Muscle strength 5/5 all ext Psychiatric: Mood and affect normal Neck: No JVD, no carotid bruits, no thyromegaly, no lymphadenopathy. Lungs:Clear bilaterally, no wheezes, rhonci, crackles Cardiovascular: Regular rate and rhythm. Loud systolic murmur. No gallops or rubs. Abdomen:Soft. Bowel sounds present. Non-tender.  Extremities: No lower extremity edema. Pulses are 2 + in the bilateral DP/PT.  Echo 01/24/14:  Left ventricle: The cavity size was normal. Wall thickness was increased in a pattern of mild LVH. There was mild concentric hypertrophy. Systolic function was normal. The estimated ejection fraction was in the range of 55% to 65%. Wall motion was normal; there were no regional wall motion abnormalities. Doppler parameters are consistent  with a reversible restrictive pattern, indicative of decreased left ventricular diastolic compliance and/or increased left atrial pressure (grade 3 diastolic dysfunction). - Aortic valve: Valve mobility was restricted. There was moderate stenosis. Peak velocity (S): 354 cm/s. (prior echo 347 cm/s). Mean gradient (S): 28 mm Hg. - Mitral valve: Calcified annulus. Mildly thickened, moderately calcified leaflets . There was moderate regurgitation.  Assessment and Plan:   1. Carotid  artery disease: Stable moderate bilateral disease with patent right CEA site and 40-59% bilateral ICA stenosis by dopplers April 2013. Will arrange repeat testing now. She refused repeat tests last year.    2. Mitral regurgitation: Moderate by echo September 2015. Will folllow.   3. CAD: She is s/p bypass and doing well. She has chronic, stable angina. I have instructed her to let me know if her chest pain changes in severity or frequency. Continue current meds. She is intolerant to ASA but takes clopidogrel daily. Lipids and BP well controlled.   4. AORTIC STENOSIS: Moderately severe by echo September 2015 with mean gradient of 28 mmHg. At this time, she does not meet criteria for consideration for AVR. She would not be a candidate for open surgical replacement. She could be considered for TAVR in the future. Repeat echo September 2015.    5. Hyperlipidemia: She is on a statin. Lipids well controlled.

## 2014-02-22 DIAGNOSIS — M79673 Pain in unspecified foot: Secondary | ICD-10-CM | POA: Diagnosis not present

## 2014-02-22 DIAGNOSIS — S99922A Unspecified injury of left foot, initial encounter: Secondary | ICD-10-CM | POA: Diagnosis not present

## 2014-02-28 ENCOUNTER — Ambulatory Visit (HOSPITAL_COMMUNITY): Payer: Medicare Other | Attending: Internal Medicine | Admitting: Cardiology

## 2014-02-28 DIAGNOSIS — I6523 Occlusion and stenosis of bilateral carotid arteries: Secondary | ICD-10-CM

## 2014-02-28 DIAGNOSIS — R0989 Other specified symptoms and signs involving the circulatory and respiratory systems: Secondary | ICD-10-CM | POA: Diagnosis not present

## 2014-02-28 DIAGNOSIS — I10 Essential (primary) hypertension: Secondary | ICD-10-CM | POA: Insufficient documentation

## 2014-02-28 DIAGNOSIS — E785 Hyperlipidemia, unspecified: Secondary | ICD-10-CM | POA: Diagnosis not present

## 2014-02-28 DIAGNOSIS — J449 Chronic obstructive pulmonary disease, unspecified: Secondary | ICD-10-CM | POA: Diagnosis not present

## 2014-02-28 DIAGNOSIS — M19041 Primary osteoarthritis, right hand: Secondary | ICD-10-CM | POA: Diagnosis not present

## 2014-02-28 DIAGNOSIS — I2510 Atherosclerotic heart disease of native coronary artery without angina pectoris: Secondary | ICD-10-CM | POA: Diagnosis not present

## 2014-02-28 DIAGNOSIS — E119 Type 2 diabetes mellitus without complications: Secondary | ICD-10-CM | POA: Diagnosis not present

## 2014-02-28 DIAGNOSIS — I739 Peripheral vascular disease, unspecified: Secondary | ICD-10-CM

## 2014-02-28 DIAGNOSIS — Z951 Presence of aortocoronary bypass graft: Secondary | ICD-10-CM | POA: Diagnosis not present

## 2014-02-28 DIAGNOSIS — I6529 Occlusion and stenosis of unspecified carotid artery: Secondary | ICD-10-CM | POA: Diagnosis not present

## 2014-02-28 DIAGNOSIS — I779 Disorder of arteries and arterioles, unspecified: Secondary | ICD-10-CM

## 2014-02-28 DIAGNOSIS — S92355A Nondisplaced fracture of fifth metatarsal bone, left foot, initial encounter for closed fracture: Secondary | ICD-10-CM | POA: Diagnosis not present

## 2014-02-28 NOTE — Progress Notes (Signed)
Carotid duplex performed 

## 2014-03-14 ENCOUNTER — Other Ambulatory Visit: Payer: Medicare Other

## 2014-03-14 DIAGNOSIS — S92355D Nondisplaced fracture of fifth metatarsal bone, left foot, subsequent encounter for fracture with routine healing: Secondary | ICD-10-CM | POA: Diagnosis not present

## 2014-03-14 DIAGNOSIS — M19041 Primary osteoarthritis, right hand: Secondary | ICD-10-CM | POA: Diagnosis not present

## 2014-04-22 ENCOUNTER — Telehealth: Payer: Self-pay | Admitting: Family Medicine

## 2014-04-22 DIAGNOSIS — J4520 Mild intermittent asthma, uncomplicated: Secondary | ICD-10-CM

## 2014-04-22 MED ORDER — ALBUTEROL SULFATE HFA 108 (90 BASE) MCG/ACT IN AERS: 2.0000 | INHALATION_SPRAY | RESPIRATORY_TRACT | Status: DC | PRN

## 2014-04-22 NOTE — Telephone Encounter (Signed)
ProAir called in

## 2014-05-19 ENCOUNTER — Encounter (HOSPITAL_COMMUNITY): Payer: Self-pay | Admitting: Emergency Medicine

## 2014-05-19 ENCOUNTER — Telehealth: Payer: Self-pay | Admitting: Cardiovascular Disease

## 2014-05-19 ENCOUNTER — Emergency Department (HOSPITAL_COMMUNITY): Payer: Medicare Other

## 2014-05-19 ENCOUNTER — Inpatient Hospital Stay (HOSPITAL_COMMUNITY)
Admission: EM | Admit: 2014-05-19 | Discharge: 2014-05-25 | DRG: 193 | Disposition: A | Discharge status: Home / Self Care | Payer: Medicare Other | Attending: Internal Medicine | Admitting: Internal Medicine

## 2014-05-19 DIAGNOSIS — I482 Chronic atrial fibrillation: Secondary | ICD-10-CM

## 2014-05-19 DIAGNOSIS — J4541 Moderate persistent asthma with (acute) exacerbation: Secondary | ICD-10-CM | POA: Diagnosis not present

## 2014-05-19 DIAGNOSIS — Z87891 Personal history of nicotine dependence: Secondary | ICD-10-CM

## 2014-05-19 DIAGNOSIS — J45909 Unspecified asthma, uncomplicated: Secondary | ICD-10-CM | POA: Diagnosis present

## 2014-05-19 DIAGNOSIS — R0902 Hypoxemia: Secondary | ICD-10-CM

## 2014-05-19 DIAGNOSIS — I350 Nonrheumatic aortic (valve) stenosis: Secondary | ICD-10-CM

## 2014-05-19 DIAGNOSIS — Z955 Presence of coronary angioplasty implant and graft: Secondary | ICD-10-CM

## 2014-05-19 DIAGNOSIS — J4520 Mild intermittent asthma, uncomplicated: Secondary | ICD-10-CM | POA: Diagnosis not present

## 2014-05-19 DIAGNOSIS — I340 Nonrheumatic mitral (valve) insufficiency: Secondary | ICD-10-CM

## 2014-05-19 DIAGNOSIS — M797 Fibromyalgia: Secondary | ICD-10-CM | POA: Diagnosis present

## 2014-05-19 DIAGNOSIS — J189 Pneumonia, unspecified organism: Secondary | ICD-10-CM | POA: Diagnosis present

## 2014-05-19 DIAGNOSIS — I252 Old myocardial infarction: Secondary | ICD-10-CM | POA: Diagnosis not present

## 2014-05-19 DIAGNOSIS — K219 Gastro-esophageal reflux disease without esophagitis: Secondary | ICD-10-CM | POA: Diagnosis present

## 2014-05-19 DIAGNOSIS — J42 Unspecified chronic bronchitis: Secondary | ICD-10-CM | POA: Diagnosis present

## 2014-05-19 DIAGNOSIS — J918 Pleural effusion in other conditions classified elsewhere: Secondary | ICD-10-CM

## 2014-05-19 DIAGNOSIS — Z7951 Long term (current) use of inhaled steroids: Secondary | ICD-10-CM

## 2014-05-19 DIAGNOSIS — R0609 Other forms of dyspnea: Secondary | ICD-10-CM | POA: Diagnosis not present

## 2014-05-19 DIAGNOSIS — E785 Hyperlipidemia, unspecified: Secondary | ICD-10-CM | POA: Diagnosis present

## 2014-05-19 DIAGNOSIS — J9811 Atelectasis: Secondary | ICD-10-CM | POA: Diagnosis not present

## 2014-05-19 DIAGNOSIS — J90 Pleural effusion, not elsewhere classified: Secondary | ICD-10-CM

## 2014-05-19 DIAGNOSIS — I080 Rheumatic disorders of both mitral and aortic valves: Secondary | ICD-10-CM | POA: Diagnosis present

## 2014-05-19 DIAGNOSIS — I4891 Unspecified atrial fibrillation: Secondary | ICD-10-CM

## 2014-05-19 DIAGNOSIS — I10 Essential (primary) hypertension: Secondary | ICD-10-CM | POA: Diagnosis present

## 2014-05-19 DIAGNOSIS — Z79899 Other long term (current) drug therapy: Secondary | ICD-10-CM

## 2014-05-19 DIAGNOSIS — Z951 Presence of aortocoronary bypass graft: Secondary | ICD-10-CM

## 2014-05-19 DIAGNOSIS — Z7902 Long term (current) use of antithrombotics/antiplatelets: Secondary | ICD-10-CM | POA: Diagnosis not present

## 2014-05-19 DIAGNOSIS — I2510 Atherosclerotic heart disease of native coronary artery without angina pectoris: Secondary | ICD-10-CM | POA: Diagnosis present

## 2014-05-19 DIAGNOSIS — I25812 Atherosclerosis of bypass graft of coronary artery of transplanted heart without angina pectoris: Secondary | ICD-10-CM | POA: Diagnosis not present

## 2014-05-19 DIAGNOSIS — R0602 Shortness of breath: Secondary | ICD-10-CM

## 2014-05-19 DIAGNOSIS — J9601 Acute respiratory failure with hypoxia: Secondary | ICD-10-CM | POA: Diagnosis present

## 2014-05-19 DIAGNOSIS — Z8673 Personal history of transient ischemic attack (TIA), and cerebral infarction without residual deficits: Secondary | ICD-10-CM

## 2014-05-19 DIAGNOSIS — J984 Other disorders of lung: Secondary | ICD-10-CM | POA: Diagnosis not present

## 2014-05-19 DIAGNOSIS — I480 Paroxysmal atrial fibrillation: Secondary | ICD-10-CM | POA: Diagnosis present

## 2014-05-19 DIAGNOSIS — R000 Tachycardia, unspecified: Secondary | ICD-10-CM | POA: Diagnosis not present

## 2014-05-19 LAB — CREATININE, SERUM
CREATININE: 0.49 mg/dL — AB (ref 0.50–1.10)
GFR, EST NON AFRICAN AMERICAN: 88 mL/min — AB (ref >90)

## 2014-05-19 LAB — CBC WITH DIFFERENTIAL/PLATELET
BASOS PCT: 0 % (ref 0–1)
Basophils Absolute: 0 10*3/uL (ref 0.0–0.1)
EOS PCT: 1 % (ref 0–5)
Eosinophils Absolute: 0.1 10*3/uL (ref 0.0–0.7)
HEMATOCRIT: 34.1 % — AB (ref 36.0–46.0)
HEMOGLOBIN: 10.8 g/dL — AB (ref 12.0–15.0)
Lymphocytes Relative: 13 % (ref 12–46)
Lymphs Abs: 1 10*3/uL (ref 0.7–4.0)
MCH: 30 pg (ref 26.0–34.0)
MCHC: 31.7 g/dL (ref 30.0–36.0)
MCV: 94.7 fL (ref 78.0–100.0)
MONO ABS: 0.5 10*3/uL (ref 0.1–1.0)
MONOS PCT: 6 % (ref 3–12)
Neutro Abs: 6.4 10*3/uL (ref 1.7–7.7)
Neutrophils Relative %: 80 % — ABNORMAL HIGH (ref 43–77)
Platelets: 188 10*3/uL (ref 150–400)
RBC: 3.6 MIL/uL — ABNORMAL LOW (ref 3.87–5.11)
RDW: 13.7 % (ref 11.5–15.5)
WBC: 8.1 10*3/uL (ref 4.0–10.5)

## 2014-05-19 LAB — CBC
HCT: 35.2 % — ABNORMAL LOW (ref 36.0–46.0)
HEMOGLOBIN: 11.1 g/dL — AB (ref 12.0–15.0)
MCH: 29.7 pg (ref 26.0–34.0)
MCHC: 31.5 g/dL (ref 30.0–36.0)
MCV: 94.1 fL (ref 78.0–100.0)
Platelets: 205 10*3/uL (ref 150–400)
RBC: 3.74 MIL/uL — ABNORMAL LOW (ref 3.87–5.11)
RDW: 14 % (ref 11.5–15.5)
WBC: 6.3 10*3/uL (ref 4.0–10.5)

## 2014-05-19 LAB — URINALYSIS, ROUTINE W REFLEX MICROSCOPIC
BILIRUBIN URINE: NEGATIVE
GLUCOSE, UA: NEGATIVE mg/dL
HGB URINE DIPSTICK: NEGATIVE
Ketones, ur: NEGATIVE mg/dL
Leukocytes, UA: NEGATIVE
Nitrite: NEGATIVE
PROTEIN: NEGATIVE mg/dL
Specific Gravity, Urine: 1.013 (ref 1.005–1.030)
Urobilinogen, UA: 0.2 mg/dL (ref 0.0–1.0)
pH: 5 (ref 5.0–8.0)

## 2014-05-19 LAB — BASIC METABOLIC PANEL
ANION GAP: 9 (ref 5–15)
BUN: 9 mg/dL (ref 6–23)
CALCIUM: 9 mg/dL (ref 8.4–10.5)
CO2: 23 mmol/L (ref 19–32)
CREATININE: 0.4 mg/dL — AB (ref 0.50–1.10)
Chloride: 108 mEq/L (ref 96–112)
Glucose, Bld: 112 mg/dL — ABNORMAL HIGH (ref 70–99)
Potassium: 3.9 mmol/L (ref 3.5–5.1)
Sodium: 140 mmol/L (ref 135–145)

## 2014-05-19 LAB — BRAIN NATRIURETIC PEPTIDE: B Natriuretic Peptide: 228.9 pg/mL — ABNORMAL HIGH (ref 0.0–100.0)

## 2014-05-19 LAB — I-STAT TROPONIN, ED: Troponin i, poc: 0.01 ng/mL (ref 0.00–0.08)

## 2014-05-19 LAB — TSH: TSH: 2.936 u[IU]/mL (ref 0.350–4.500)

## 2014-05-19 LAB — I-STAT CG4 LACTIC ACID, ED
Lactic Acid, Venous: 1.03 mmol/L (ref 0.5–2.2)
Lactic Acid, Venous: 1.51 mmol/L (ref 0.5–2.2)

## 2014-05-19 MED ORDER — ALBUTEROL SULFATE HFA 108 (90 BASE) MCG/ACT IN AERS: 2.0000 | INHALATION_SPRAY | RESPIRATORY_TRACT | Status: DC | PRN

## 2014-05-19 MED ORDER — SODIUM CHLORIDE 0.9 % IJ SOLN
3.0000 mL | Freq: Two times a day (BID) | INTRAMUSCULAR | Status: DC
  Administered 2014-05-20 – 2014-05-25 (×6): 3 mL via INTRAVENOUS

## 2014-05-19 MED ORDER — DEXTROSE 5 % IV SOLN: 1.0000 g | INTRAVENOUS | Status: DC

## 2014-05-19 MED ORDER — PANTOPRAZOLE SODIUM 40 MG PO TBEC
40.0000 mg | DELAYED_RELEASE_TABLET | Freq: Every day | ORAL | Status: DC
  Administered 2014-05-20 – 2014-05-25 (×6): 40 mg via ORAL
  Filled 2014-05-19 (×6): qty 1

## 2014-05-19 MED ORDER — GUAIFENESIN ER 600 MG PO TB12
600.0000 mg | ORAL_TABLET | Freq: Two times a day (BID) | ORAL | Status: DC
  Administered 2014-05-19 – 2014-05-25 (×12): 600 mg via ORAL
  Filled 2014-05-19 (×13): qty 1

## 2014-05-19 MED ORDER — NITROGLYCERIN 0.4 MG SL SUBL: 0.4000 mg | SUBLINGUAL_TABLET | SUBLINGUAL | Status: DC | PRN

## 2014-05-19 MED ORDER — AZITHROMYCIN 500 MG PO TABS: 500.0000 mg | ORAL_TABLET | ORAL | Status: DC

## 2014-05-19 MED ORDER — CLOPIDOGREL BISULFATE 75 MG PO TABS
75.0000 mg | ORAL_TABLET | Freq: Every day | ORAL | Status: DC
Start: 2014-05-19 — End: 2014-05-19

## 2014-05-19 MED ORDER — ROSUVASTATIN CALCIUM 40 MG PO TABS
40.0000 mg | ORAL_TABLET | Freq: Every day | ORAL | Status: DC
  Administered 2014-05-20 – 2014-05-24 (×5): 40 mg via ORAL
  Filled 2014-05-19 (×6): qty 1

## 2014-05-19 MED ORDER — SODIUM CHLORIDE 0.9 % IV SOLN: 250.0000 mL | INTRAVENOUS | Status: DC | PRN

## 2014-05-19 MED ORDER — DIAZEPAM 2 MG PO TABS: 2.0000 mg | ORAL_TABLET | Freq: Four times a day (QID) | ORAL | Status: DC | PRN

## 2014-05-19 MED ORDER — SODIUM CHLORIDE 0.9 % IJ SOLN: 3.0000 mL | INTRAMUSCULAR | Status: DC | PRN

## 2014-05-19 MED ORDER — ONDANSETRON HCL 4 MG PO TABS: 4.0000 mg | ORAL_TABLET | Freq: Four times a day (QID) | ORAL | Status: DC | PRN

## 2014-05-19 MED ORDER — HEPARIN (PORCINE) IN NACL 100-0.45 UNIT/ML-% IJ SOLN
950.0000 [IU]/h | INTRAMUSCULAR | Status: DC
  Administered 2014-05-19: 950 [IU]/h via INTRAVENOUS
  Filled 2014-05-19 (×2): qty 250

## 2014-05-19 MED ORDER — ALBUTEROL SULFATE (2.5 MG/3ML) 0.083% IN NEBU
2.5000 mg | INHALATION_SOLUTION | RESPIRATORY_TRACT | Status: DC | PRN
  Administered 2014-05-20 (×2): 2.5 mg via RESPIRATORY_TRACT
  Filled 2014-05-19 (×2): qty 3

## 2014-05-19 MED ORDER — HEPARIN SODIUM (PORCINE) 5000 UNIT/ML IJ SOLN: 5000.0000 [IU] | Freq: Three times a day (TID) | INTRAMUSCULAR | Status: DC

## 2014-05-19 MED ORDER — ISOSORBIDE DINITRATE 30 MG PO TABS
30.0000 mg | ORAL_TABLET | Freq: Two times a day (BID) | ORAL | Status: DC
  Administered 2014-05-19 – 2014-05-25 (×12): 30 mg via ORAL
  Filled 2014-05-19 (×13): qty 1

## 2014-05-19 MED ORDER — ONDANSETRON HCL 4 MG/2ML IJ SOLN: 4.0000 mg | Freq: Four times a day (QID) | INTRAMUSCULAR | Status: DC | PRN

## 2014-05-19 MED ORDER — POLYETHYLENE GLYCOL 3350 17 G PO PACK
17.0000 g | PACK | Freq: Every day | ORAL | Status: DC | PRN
  Administered 2014-05-23: 17 g via ORAL
  Filled 2014-05-19 (×3): qty 1

## 2014-05-19 MED ORDER — SODIUM CHLORIDE 0.9 % IV SOLN
INTRAVENOUS | Status: AC
  Administered 2014-05-19: 19:00:00 via INTRAVENOUS

## 2014-05-19 MED ORDER — ACETAMINOPHEN 325 MG PO TABS: 650.0000 mg | ORAL_TABLET | Freq: Four times a day (QID) | ORAL | Status: DC | PRN

## 2014-05-19 MED ORDER — CEFTRIAXONE SODIUM IN DEXTROSE 20 MG/ML IV SOLN
1.0000 g | INTRAVENOUS | Status: DC
  Administered 2014-05-20 – 2014-05-21 (×2): 1 g via INTRAVENOUS
  Filled 2014-05-19 (×3): qty 50

## 2014-05-19 MED ORDER — HEPARIN BOLUS VIA INFUSION
3000.0000 [IU] | Freq: Once | INTRAVENOUS | Status: AC
  Administered 2014-05-19: 3000 [IU] via INTRAVENOUS
  Filled 2014-05-19: qty 3000

## 2014-05-19 MED ORDER — GUAIFENESIN 100 MG/5ML PO SYRP
200.0000 mg | ORAL_SOLUTION | Freq: Three times a day (TID) | ORAL | Status: DC | PRN
  Filled 2014-05-19: qty 10

## 2014-05-19 MED ORDER — METOPROLOL SUCCINATE ER 25 MG PO TB24
25.0000 mg | ORAL_TABLET | Freq: Every day | ORAL | Status: DC
  Administered 2014-05-20 – 2014-05-24 (×5): 25 mg via ORAL
  Filled 2014-05-19 (×5): qty 1

## 2014-05-19 MED ORDER — CLOPIDOGREL BISULFATE 75 MG PO TABS
75.0000 mg | ORAL_TABLET | Freq: Every day | ORAL | Status: DC
  Administered 2014-05-20: 75 mg via ORAL
  Filled 2014-05-19: qty 1

## 2014-05-19 MED ORDER — IRBESARTAN 75 MG PO TABS
75.0000 mg | ORAL_TABLET | Freq: Every day | ORAL | Status: DC
  Administered 2014-05-20: 75 mg via ORAL
  Filled 2014-05-19: qty 1

## 2014-05-19 MED ORDER — ACETAMINOPHEN 650 MG RE SUPP: 650.0000 mg | Freq: Four times a day (QID) | RECTAL | Status: DC | PRN

## 2014-05-19 MED ORDER — AZITHROMYCIN 500 MG PO TABS
500.0000 mg | ORAL_TABLET | ORAL | Status: DC
  Administered 2014-05-20 – 2014-05-21 (×2): 500 mg via ORAL
  Filled 2014-05-19 (×3): qty 1

## 2014-05-19 MED ORDER — TRAMADOL HCL 50 MG PO TABS: 100.0000 mg | ORAL_TABLET | Freq: Three times a day (TID) | ORAL | Status: DC | PRN

## 2014-05-19 MED ORDER — SODIUM CHLORIDE 0.9 % IV BOLUS (SEPSIS)
250.0000 mL | Freq: Once | INTRAVENOUS | Status: AC
  Administered 2014-05-19: 250 mL via INTRAVENOUS

## 2014-05-19 MED ORDER — FLUTICASONE PROPIONATE HFA 110 MCG/ACT IN AERO
2.0000 | INHALATION_SPRAY | Freq: Two times a day (BID) | RESPIRATORY_TRACT | Status: DC
  Administered 2014-05-20 – 2014-05-25 (×11): 2 via RESPIRATORY_TRACT
  Filled 2014-05-19: qty 12

## 2014-05-19 MED ORDER — CETYLPYRIDINIUM CHLORIDE 0.05 % MT LIQD
7.0000 mL | Freq: Two times a day (BID) | OROMUCOSAL | Status: DC
  Administered 2014-05-19 – 2014-05-25 (×11): 7 mL via OROMUCOSAL

## 2014-05-19 MED ORDER — VITAMIN D3 25 MCG (1000 UNIT) PO TABS
1000.0000 [IU] | ORAL_TABLET | Freq: Every day | ORAL | Status: DC
  Administered 2014-05-19 – 2014-05-25 (×7): 1000 [IU] via ORAL
  Filled 2014-05-19 (×7): qty 1

## 2014-05-19 MED ORDER — DEXTROSE 5 % IV SOLN
500.0000 mg | Freq: Once | INTRAVENOUS | Status: AC
  Administered 2014-05-19: 500 mg via INTRAVENOUS
  Filled 2014-05-19: qty 500

## 2014-05-19 MED ORDER — SODIUM CHLORIDE 0.9 % IJ SOLN
3.0000 mL | Freq: Two times a day (BID) | INTRAMUSCULAR | Status: DC
  Administered 2014-05-22 – 2014-05-25 (×3): 3 mL via INTRAVENOUS

## 2014-05-19 MED ORDER — DILTIAZEM HCL ER COATED BEADS 120 MG PO CP24
120.0000 mg | ORAL_CAPSULE | Freq: Every day | ORAL | Status: DC
  Administered 2014-05-20 – 2014-05-23 (×4): 120 mg via ORAL
  Filled 2014-05-19 (×4): qty 1

## 2014-05-19 MED ORDER — DEXTROSE 5 % IV SOLN
1.0000 g | Freq: Once | INTRAVENOUS | Status: AC
  Administered 2014-05-19: 1 g via INTRAVENOUS
  Filled 2014-05-19: qty 10

## 2014-05-19 MED ORDER — CALCIUM CARBONATE-VITAMIN D 500-200 MG-UNIT PO TABS
1.0000 | ORAL_TABLET | Freq: Every day | ORAL | Status: DC
  Administered 2014-05-20 – 2014-05-25 (×6): 1 via ORAL
  Filled 2014-05-19 (×7): qty 1

## 2014-05-19 MED ORDER — GUAIFENESIN 100 MG/5ML PO LIQD: 200.0000 mg | Freq: Three times a day (TID) | ORAL | Status: DC | PRN

## 2014-05-19 NOTE — ED Notes (Signed)
Heart healthy tray ordered 

## 2014-05-19 NOTE — H&P (Signed)
[facility] History and Physical  [name] XBD:[id number] DOB: January 31, 1929 DOA: 05/19/2014  Referring physician: Dr. Eulis Foster PCP: Wyatt Haste, MD   Chief Complaint: SOB  HPI: [name] is a 80 y.o. female with past medical history of asthma by PFTs followed by Dr. Elsworth Soho, past medical history of coronary artery sees that comes in for worsening cough and shortness of breath that started 3-4 days prior to admission. She relates her cough is productive and yellow in color and used to be clear. She relates shortness of breath with ambulation. She relates she cannot walk to the door without being short of breath. She denies any chest pain, weakness, dizziness, nausea, or vomiting.  In the ED: She is found to be satting 90%, chest x-ray showed results as above so we are consulted for further evaluation.  Review of Systems:  Constitutional:  No weight loss, night sweats, Fevers, chills, fatigue.  HEENT:  No headaches, Difficulty swallowing,Tooth/dental problems,Sore throat,  No sneezing, itching, ear ache, nasal congestion, post nasal drip,  Cardio-vascular:  No chest pain, Orthopnea, PND, swelling in lower extremities, anasarca, dizziness, palpitations  GI:  No heartburn, indigestion, abdominal pain, nausea, vomiting, diarrhea, change in bowel habits, loss of appetite  Skin:  no rash or lesions.  GU:  no dysuria, change in color of urine, no urgency or frequency. No flank pain.  Musculoskeletal:  No joint pain or swelling. No decreased range of motion. No back pain.  Psych:  No change in mood or affect. No depression or anxiety. No memory loss.   Past Medical History  Diagnosis Date  . Arthritis   . Asthma   . GERD (gastroesophageal reflux disease)   . Hyperlipidemia   . Myocardial infarction 2003    Stent to CFX  . Allergy   . Hypertension   . Eczema   . ASHD (arteriosclerotic heart disease)   . CAD (coronary artery disease) 06/2001  . Bronchitis   .  Stroke    Past Surgical History  Procedure Laterality Date  . Abdominal hysterectomy  1976  . Carotid endarterectomy  2010  . Coronary artery bypass graft  01/2005    LIMA-D1; SVG-LAD; SVG-OM; SVG-PDA  . Tonsillectomy    . Cardiac surgery  stents  . Tumor removal     Social History:  reports that she has never smoked. She quit smokeless tobacco use about 12 years ago. Her smokeless tobacco use included Snuff. She reports that she does not drink alcohol or use illicit drugs.  Allergies  Allergen Reactions  . Bactrim [Sulfamethoxazole-Trimethoprim] Other (See Comments)    Makes her very strange   . Amitriptyline Hcl Rash  . Aspirin Swelling and Rash  . Zetia [Ezetimibe] Rash    Family History  Problem Relation Age of Onset  . Heart failure Mother   . Other Father      Prior to Admission medications   Medication Sig Start Date End Date Taking? Authorizing Provider  albuterol (PROAIR HFA) 108 (90 BASE) MCG/ACT inhaler Inhale 2 puffs into the lungs every 4 (four) hours as needed for wheezing. 04/22/14  Yes Denita Lung, MD  calcium-vitamin D (OSCAL WITH D) 500-200 MG-UNIT per tablet Take 1 tablet by mouth daily with breakfast.   Yes Historical Provider, MD  cholecalciferol (VITAMIN D) 1000 UNITS tablet Take 1,000 Units by mouth daily.   Yes Historical Provider, MD  clopidogrel (PLAVIX) 75 MG tablet Take 1 tablet (75 mg total) by mouth daily. 07/26/13  Yes Harrell Gave  Santina Evans, MD  diazepam (VALIUM) 2 MG tablet Take 2 mg by mouth every 6 (six) hours as needed (dizziness). 06/04/13  Yes Denita Lung, MD  diltiazem (CARDIZEM CD) 120 MG 24 hr capsule Take 1 capsule (120 mg total) by mouth daily. 02/07/14  Yes Burnell Blanks, MD  fluticasone Asencion Islam) 50 MCG/ACT nasal spray instill 2 sprays into each nostril once daily 06/04/13  Yes Denita Lung, MD  fluticasone [facility] HFA) 110 MCG/ACT inhaler inhale 1 puff by mouth twice a day 06/04/13  Yes Denita Lung, MD  guaiFENesin  (MUCINEX) 600 MG 12 hr tablet Take 600 mg by mouth 2 (two) times daily.     Yes Historical Provider, MD  guaiFENesin (ROBITUSSIN) 100 MG/5ML liquid Take 200 mg by mouth 3 (three) times daily as needed (Chronic bronchitis).   Yes Historical Provider, MD  isosorbide dinitrate (ISORDIL) 30 MG tablet Take 1 tablet (30 mg total) by mouth 2 (two) times daily. 07/26/13  Yes Burnell Blanks, MD  loratadine (CLARITIN) 10 MG tablet Take 10 mg by mouth daily.     Yes Historical Provider, MD  metoprolol succinate (TOPROL-XL) 25 MG 24 hr tablet take 1 tablet by mouth once daily 06/04/13  Yes Denita Lung, MD  omeprazole (PRILOSEC) 20 MG capsule take 2 capsules by mouth once daily 06/04/13  Yes Denita Lung, MD  rosuvastatin (CRESTOR) 40 MG tablet take 1 tablet by mouth once daily 06/04/13  Yes Denita Lung, MD  traMADol (ULTRAM) 50 MG tablet TAKE 2 TABLETS BY MOUTH 3 TIMES A DAY AS NEEDED FOR PAIN 09/04/13  Yes Denita Lung, MD  traMADol (ULTRAM) 50 MG tablet Take 100 mg by mouth 3 (three) times daily as needed (pain).   Yes Historical Provider, MD  valsartan (DIOVAN) 40 MG tablet Take 1 tablet (40 mg total) by mouth daily. 07/26/13  Yes Burnell Blanks, MD  nitroGLYCERIN (NITROSTAT) 0.4 MG SL tablet Place 0.4 mg under the tongue every 5 (five) minutes as needed for chest pain (MAX 3 Tablets). 01/21/14   Burnell Blanks, MD   Physical Exam: Filed Vitals:   05/19/14 1430 05/19/14 1440 05/19/14 1551 05/19/14 1600  BP: 118/51  122/67 109/53  Pulse: 58 57 57 55  Temp:  98.2 F (36.8 C) 98.1 F (36.7 C)   TempSrc:  Oral Oral   Resp: 24 22 20 24   Height:   5\' 5"  (1.651 m)   Weight:   64.411 kg (142 lb)   SpO2: 96% 97% 96% 98%    Wt Readings from Last 3 Encounters:  05/19/14 64.411 kg (142 lb)  02/07/14 66.134 kg (145 lb 12.8 oz)  07/26/13 66.28 kg (146 lb 1.9 oz)    General:  Appears calm and comfortable Eyes: PERRL, normal lids, irises & conjunctiva ENT: grossly normal hearing,  lips & tongue Neck: no LAD, masses or thyromegaly Cardiovascular: Urinary rhythm with a holosystolic murmur in the aortic area. Telemetry: SR, no arrhythmias  Respiratory: Moderate air movement with crackles on the left. No wheezing. Abdomen: soft, ntnd Skin: no rash or induration seen on limited exam Musculoskeletal: grossly normal tone BUE/BLE Psychiatric: grossly normal mood and affect, speech fluent and appropriate Neurologic: grossly non-focal.          Labs on Admission:  Basic Metabolic Panel:  Recent Labs Lab 05/19/14 1103  NA 140  K 3.9  CL 108  CO2 23  GLUCOSE 112*  BUN 9  CREATININE 0.40*  CALCIUM  9.0   Liver Function Tests: No results for input(s): AST, ALT, ALKPHOS, BILITOT, PROT, ALBUMIN in the last 168 hours. No results for input(s): LIPASE, AMYLASE in the last 168 hours. No results for input(s): AMMONIA in the last 168 hours. CBC:  Recent Labs Lab 05/19/14 1103  WBC 8.1  NEUTROABS 6.4  HGB 10.8*  HCT 34.1*  MCV 94.7  PLT 188   Cardiac Enzymes: No results for input(s): CKTOTAL, CKMB, CKMBINDEX, TROPONINI in the last 168 hours.  BNP (last 3 results) No results for input(s): PROBNP in the last 8760 hours. CBG: No results for input(s): GLUCAP in the last 168 hours.  Radiological Exams on Admission: Dg Chest 2 View  05/19/2014   CLINICAL DATA:  Chronic bronchitis, shortness of breath for 3-4 days, cough.  EXAM: CHEST  2 VIEW  COMPARISON:  07/30/2012.  FINDINGS: Trachea is midline. Heart size within normal limits. Thoracic aorta is calcified. There is right middle lobe consolidation with a small right pleural effusion. Probable mild scarring in the left lung base. Biapical pleural thickening. Scoliosis.  IMPRESSION: 1. Airspace consolidation in the right middle lobe is most likely due to pneumonia, with a small right parapneumonic effusion. Follow-up to clearing is recommended, to exclude underlying malignancy. 2. Favor scarring at the left lung base.  Difficult to exclude early/mild pneumonia.   Electronically Signed   By: Lorin Picket M.D.   On: 05/19/2014 12:12    EKG: Independently reviewed. Atrial fibrillation rate controlled  Assessment/Plan CAP (community acquired pneumonia) - I agree with Rocephin and azithromycin. - Blood cultures have already been ordered in the emergency room. - Continue albuterol when necessary. - Check an influenza PCR start her on Tamiflu empirically if negative can be DC in the morning.  Asthma - Currently not wheezing and not hypoxic continue inhaled steroids.  Aortic stenosis, moderate: - Stable.  Paroxysmal Atrial fibrillation: - She's had a Maze procedure for atrial fibrillation, her previous EKG showed sinus rhythm with first-degree AV block. - She's currently not on anticoagulation. - Continue diltiazem and metoprolol. - Admitted to telemetry. Currently weight controlled. - I will consult cardiology.   Code Status: presumed full DVT Prophylaxis:heparin Family Communication: daughter Disposition Plan: Inpatient  Time spent: 33 minutes  Charlynne Cousins [facility] Pager [phone]

## 2014-05-19 NOTE — ED Notes (Signed)
Pt O2 at 90% with RR at 28. Dr. Eulis Foster informed and 2L placed via nasal cannula on pt per Dr. Eulis Foster.

## 2014-05-19 NOTE — Telephone Encounter (Signed)
New message    Pt c/o Shortness Of Breath: STAT if SOB developed within the last 24 hours or pt is noticeably SOB on the phone  1. Are you currently SOB (can you hear that pt is SOB on the phone)? Yes . H/o asthma , no oxygen at home.    2. How long have you been experiencing SOB?  Several days worse been in a long time   3. Are you SOB when sitting or when up moving around? both  4. Are you currently experiencing any other symptoms?  No, only coughing.

## 2014-05-19 NOTE — ED Notes (Signed)
Removed [facility]xygen per request of Dr. Eulis Foster to evaluate Room Air O2. Pt monitored by continuous Pulse Ox and in NAD. Primary RN Elmyra Ricks aware.

## 2014-05-19 NOTE — Telephone Encounter (Signed)
Agree. cdm 

## 2014-05-19 NOTE — ED Provider Notes (Addendum)
CSN: [id number]     Arrival date & time 05/19/14  1041 History   First MD Initiated Contact with Patient 05/19/14 1244     Chief Complaint  Patient presents with  . Shortness of Breath     (Consider location/radiation/quality/duration/timing/severity/associated sxs/prior Treatment) HPI   [name] is a 80 y.o. female who presents for evaluation of shortness of breath associated with cough for several days.  Cough is productive of yellow sputum.  Shortness of breath is worse with ambulation.  This prevents her from walking more than a few feet.  She denies chest pain, weakness or dizziness.  She is taking her usual medications, without relief.  She is here, hematuria, by private vehicle, with her daughter.  There are no other known modifying factors.  Past Medical History  Diagnosis Date  . Arthritis   . Asthma   . GERD (gastroesophageal reflux disease)   . Hyperlipidemia   . Myocardial infarction 2003    Stent to CFX  . Allergy   . Hypertension   . Eczema   . ASHD (arteriosclerotic heart disease)   . CAD (coronary artery disease) 06/2001  . Bronchitis   . Stroke    Past Surgical History  Procedure Laterality Date  . Abdominal hysterectomy  1976  . Carotid endarterectomy  2010  . Coronary artery bypass graft  01/2005    LIMA-D1; SVG-LAD; SVG-OM; SVG-PDA  . Tonsillectomy    . Cardiac surgery  stents  . Tumor removal     Family History  Problem Relation Age of Onset  . Heart failure Mother   . Other Father    History  Substance Use Topics  . Smoking status: Never Smoker   . Smokeless tobacco: Former Systems developer    Types: Snuff    Quit date: 05/23/2001  . Alcohol Use: No   OB History    No data available     Review of Systems  All other systems reviewed and are negative.     Allergies  Bactrim; Amitriptyline hcl; Aspirin; and Zetia  Home Medications   Prior to Admission medications   Medication Sig Start Date End Date Taking? Authorizing Provider   albuterol (PROAIR HFA) 108 (90 BASE) MCG/ACT inhaler Inhale 2 puffs into the lungs every 4 (four) hours as needed for wheezing. 04/22/14  Yes Denita Lung, MD  calcium-vitamin D (OSCAL WITH D) 500-200 MG-UNIT per tablet Take 1 tablet by mouth daily with breakfast.   Yes Historical Provider, MD  cholecalciferol (VITAMIN D) 1000 UNITS tablet Take 1,000 Units by mouth daily.   Yes Historical Provider, MD  clopidogrel (PLAVIX) 75 MG tablet Take 1 tablet (75 mg total) by mouth daily. 07/26/13  Yes Burnell Blanks, MD  diazepam (VALIUM) 2 MG tablet Take 2 mg by mouth every 6 (six) hours as needed (dizziness). 06/04/13  Yes Denita Lung, MD  diltiazem (CARDIZEM CD) 120 MG 24 hr capsule Take 1 capsule (120 mg total) by mouth daily. 02/07/14  Yes Burnell Blanks, MD  fluticasone [employer]) 50 MCG/ACT nasal spray instill 2 sprays into each nostril once daily 06/04/13  Yes Denita Lung, MD  fluticasone [facility] HFA) 110 MCG/ACT inhaler inhale 1 puff by mouth twice a day 06/04/13  Yes Denita Lung, MD  guaiFENesin (MUCINEX) 600 MG 12 hr tablet Take 600 mg by mouth 2 (two) times daily.     Yes Historical Provider, MD  guaiFENesin (ROBITUSSIN) 100 MG/5ML liquid Take 200 mg by mouth 3 (three)  times daily as needed (Chronic bronchitis).   Yes Historical Provider, MD  isosorbide dinitrate (ISORDIL) 30 MG tablet Take 1 tablet (30 mg total) by mouth 2 (two) times daily. 07/26/13  Yes Burnell Blanks, MD  loratadine (CLARITIN) 10 MG tablet Take 10 mg by mouth daily.     Yes Historical Provider, MD  metoprolol succinate (TOPROL-XL) 25 MG 24 hr tablet take 1 tablet by mouth once daily 06/04/13  Yes Denita Lung, MD  omeprazole (PRILOSEC) 20 MG capsule take 2 capsules by mouth once daily 06/04/13  Yes Denita Lung, MD  rosuvastatin (CRESTOR) 40 MG tablet take 1 tablet by mouth once daily 06/04/13  Yes Denita Lung, MD  traMADol (ULTRAM) 50 MG tablet TAKE 2 TABLETS BY MOUTH 3 TIMES A DAY AS NEEDED FOR  PAIN 09/04/13  Yes Denita Lung, MD  traMADol (ULTRAM) 50 MG tablet Take 100 mg by mouth 3 (three) times daily as needed (pain).   Yes Historical Provider, MD  valsartan (DIOVAN) 40 MG tablet Take 1 tablet (40 mg total) by mouth daily. 07/26/13  Yes Burnell Blanks, MD  nitroGLYCERIN (NITROSTAT) 0.4 MG SL tablet Place 0.4 mg under the tongue every 5 (five) minutes as needed for chest pain (MAX 3 Tablets). 01/21/14   Burnell Blanks, MD   BP 142/71 mmHg  Pulse 83  Temp(Src) 98.1 F (36.7 C) (Oral)  Resp 22  Ht 5\' 5"  (1.651 m)  Wt 141 lb 15.6 oz (64.4 kg)  BMI 23.63 kg/m2  SpO2 95% Physical Exam  Constitutional: She is oriented to person, place, and time. She appears well-developed. No distress.  Elderly, frail  HENT:  Head: Normocephalic and atraumatic.  Right Ear: External ear normal.  Left Ear: External ear normal.  Eyes: Conjunctivae and EOM are normal. Pupils are equal, round, and reactive to light.  Neck: Normal range of motion and phonation normal. Neck supple.  Cardiovascular: Normal rate, regular rhythm and normal heart sounds.   Pulmonary/Chest: Effort normal. She exhibits no bony tenderness.  Decreased air movement, right greater than left.  Rales, right sided.  Abdominal: Soft. There is no tenderness.  Musculoskeletal: Normal range of motion.  Neurological: She is alert and oriented to person, place, and time. No cranial nerve deficit or sensory deficit. She exhibits normal muscle tone. Coordination normal.  Skin: Skin is warm, dry and intact.  Psychiatric: She has a normal mood and affect. Her behavior is normal. Judgment and thought content normal.  Nursing note and vitals reviewed.   ED Course  Procedures (including critical care time)  Medications  calcium-vitamin D (OSCAL WITH D) 500-200 MG-UNIT per tablet 1 tablet (1 tablet Oral Given 05/20/14 0758)  cholecalciferol (VITAMIN D) tablet 1,000 Units (1,000 Units Oral Given 05/19/14 2147)  traMADol  (ULTRAM) tablet 100 mg (not administered)  diazepam (VALIUM) tablet 2 mg (not administered)  diltiazem (CARDIZEM CD) 24 hr capsule 120 mg (120 mg Oral Not Given 05/19/14 1815)  nitroGLYCERIN (NITROSTAT) SL tablet 0.4 mg (not administered)  isosorbide dinitrate (ISORDIL) tablet 30 mg (30 mg Oral Given 05/19/14 2147)  irbesartan (AVAPRO) tablet 75 mg (75 mg Oral Not Given 05/19/14 1815)  fluticasone (FLOVENT HFA) 110 MCG/ACT inhaler 2 puff (2 puffs Inhalation Given 05/20/14 0752)  metoprolol succinate (TOPROL-XL) 24 hr tablet 25 mg (25 mg Oral Not Given 05/19/14 1815)  pantoprazole (PROTONIX) EC tablet 40 mg (40 mg Oral Not Given 05/19/14 1815)  rosuvastatin (CRESTOR) tablet 40 mg (not administered)  guaiFENesin ([place])  12 hr tablet 600 mg (600 mg Oral Given 05/19/14 2146)  sodium chloride 0.9 % injection 3 mL (3 mLs Intravenous Not Given 05/19/14 2200)  sodium chloride 0.9 % injection 3 mL (not administered)  0.9 %  sodium chloride infusion (not administered)  acetaminophen (TYLENOL) tablet 650 mg (not administered)    Or  acetaminophen (TYLENOL) suppository 650 mg (not administered)  polyethylene glycol (MIRALAX / GLYCOLAX) packet 17 g (not administered)  ondansetron (ZOFRAN) tablet 4 mg (not administered)    Or  ondansetron (ZOFRAN) injection 4 mg (not administered)  sodium chloride 0.9 % injection 3 mL (3 mLs Intravenous Not Given 05/19/14 2200)  heparin ADULT infusion 100 units/mL (25000 units/250 mL) (950 Units/hr Intravenous New Bag/Given 05/19/14 2052)  guaifenesin (ROBITUSSIN) 100 MG/5ML syrup 200 mg (not administered)  albuterol (PROVENTIL) (2.5 MG/3ML) 0.083% nebulizer solution 2.5 mg (2.5 mg Nebulization Given 05/20/14 0459)  clopidogrel (PLAVIX) tablet 75 mg (not administered)  cefTRIAXone (ROCEPHIN) 1 g in dextrose 5 % 50 mL IVPB - Premix (not administered)  azithromycin (ZITHROMAX) tablet 500 mg (not administered)  antiseptic oral rinse (CPC / CETYLPYRIDINIUM CHLORIDE  0.05%) solution 7 mL (7 mLs Mouth Rinse Given 05/19/14 2200)  cefTRIAXone (ROCEPHIN) 1 g in dextrose 5 % 50 mL IVPB (0 g Intravenous Stopped 05/19/14 1408)  azithromycin (ZITHROMAX) 500 mg in dextrose 5 % 250 mL IVPB (0 mg Intravenous Stopped 05/19/14 1640)  sodium chloride 0.9 % bolus 250 mL (0 mLs Intravenous Stopped 05/19/14 1443)  0.9 %  sodium chloride infusion ( Intravenous New Bag/Given 05/19/14 1857)  heparin bolus via infusion 3,000 Units (3,000 Units Intravenous Given 05/19/14 2049)    Patient Vitals for the past 24 hrs:  BP Temp Temp src Pulse Resp SpO2 Height Weight  05/20/14 0752 - - - - - 95 % - -  05/20/14 0550 - - - - (!) 22 - - -  05/20/14 0522 (!) 142/71 mmHg 98.1 F (36.7 C) Oral 83 (!) 34 94 % - -  05/20/14 0459 - - - - - 95 % - -  05/19/14 2135 (!) 121/48 mmHg 98.1 F (36.7 C) Oral 76 - 95 % - -  05/19/14 1848 (!) 151/56 mmHg 99.3 F (37.4 C) Oral - 18 96 % - -  05/19/14 1847 - - - - - - 5\' 5"  (1.651 m) 141 lb 15.6 oz (64.4 kg)  05/19/14 1700 - - - (!) 55 22 96 % - -  05/19/14 1615 (!) 102/51 mmHg - - (!) 55 20 93 % - -  05/19/14 1600 (!) 109/53 mmHg - - (!) 55 24 98 % - -  05/19/14 1551 122/67 mmHg 98.1 F (36.7 C) Oral (!) 57 20 96 % 5\' 5"  (1.651 m) 142 lb (64.411 kg)  05/19/14 1440 - 98.2 F (36.8 C) Oral (!) 57 22 97 % - -  05/19/14 1430 (!) 118/51 mmHg - - (!) 58 24 96 % - -  05/19/14 1415 121/59 mmHg - - (!) 57 26 97 % - -  05/19/14 1400 137/64 mmHg - - (!) 56 19 97 % - -  05/19/14 1345 111/70 mmHg - - (!) 54 24 96 % - -  05/19/14 1340 128/62 mmHg - - (!) 59 17 96 % - -  05/19/14 1300 116/59 mmHg - - (!) 58 19 95 % - -  05/19/14 1101 - - - - - 95 % - -  05/19/14 1100 121/66 mmHg 98.2 F (36.8 C) Oral 80 18 92 % - -  4:14 PM Reevaluation with update and discussion. After initial assessment and treatment, an updated evaluation reveals she remains a somewhat dyspneic despite being on oxygen.  Initial oxygen saturation was 92% on room air.  While on  nasal cannula oxygen, serious 98%.  Trial off oxygen reveals oxygen saturation dropped to 90%, and she became To 28/m.  This indicates mild respiratory distress. Adaleah Forget L    4:39 PM-Consult complete with Dr. Aileen Fass. Patient case explained and discussed. He agrees to admit patient for further evaluation and treatment. Call ended at 16:55    Labs Review Labs Reviewed  BASIC METABOLIC PANEL - Abnormal; Notable for the following:    Glucose, Bld 112 (*)    Creatinine, Ser 0.40 (*)    All other components within normal limits  BRAIN NATRIURETIC PEPTIDE - Abnormal; Notable for the following:    B Natriuretic Peptide 228.9 (*)    All other components within normal limits  CBC WITH DIFFERENTIAL - Abnormal; Notable for the following:    RBC 3.60 (*)    Hemoglobin 10.8 (*)    HCT 34.1 (*)    Neutrophils Relative % 80 (*)    All other components within normal limits  CBC - Abnormal; Notable for the following:    RBC 3.74 (*)    Hemoglobin 11.1 (*)    HCT 35.2 (*)    All other components within normal limits  CREATININE, SERUM - Abnormal; Notable for the following:    Creatinine, Ser 0.49 (*)    GFR calc non Af Amer 88 (*)    All other components within normal limits  CBC - Abnormal; Notable for the following:    RBC 3.60 (*)    Hemoglobin 10.6 (*)    HCT 34.0 (*)    All other components within normal limits  CULTURE, BLOOD (ROUTINE X 2)  CULTURE, BLOOD (ROUTINE X 2)  URINE CULTURE  CULTURE, EXPECTORATED SPUTUM-ASSESSMENT  GRAM STAIN  URINALYSIS, ROUTINE W REFLEX MICROSCOPIC  STREP PNEUMONIAE URINARY ANTIGEN  HIV ANTIBODY (ROUTINE TESTING)  INFLUENZA PANEL BY PCR (TYPE A & B, H1N1)  TSH  PROTIME-INR  HEPARIN LEVEL (UNFRACTIONATED)  LEGIONELLA ANTIGEN, URINE  I-STAT TROPOININ, ED  I-STAT CG4 LACTIC ACID, ED  I-STAT CG4 LACTIC ACID, ED    Imaging Review Dg Chest 2 View  05/19/2014   CLINICAL DATA:  Chronic bronchitis, shortness of breath for 3-4 days, cough.   EXAM: CHEST  2 VIEW  COMPARISON:  07/30/2012.  FINDINGS: Trachea is midline. Heart size within normal limits. Thoracic aorta is calcified. There is right middle lobe consolidation with a small right pleural effusion. Probable mild scarring in the left lung base. Biapical pleural thickening. Scoliosis.  IMPRESSION: 1. Airspace consolidation in the right middle lobe is most likely due to pneumonia, with a small right parapneumonic effusion. Follow-up to clearing is recommended, to exclude underlying malignancy. 2. Favor scarring at the left lung base. Difficult to exclude early/mild pneumonia.   Electronically Signed   By: Lorin Picket M.D.   On: 05/19/2014 12:12     EKG Interpretation   Date/Time:  Monday May 19 2014 10:50:42 EST Ventricular Rate:  82 PR Interval:    QRS Duration: 82 QT Interval:  402 QTC Calculation: 469 R Axis:   72 Text Interpretation:  Atrial fibrillation Abnormal ECG Since last tracing  Atrial fibrillation is new Confirmed by Eulis Foster  MD, Vira Agar ([id number]) on  05/20/2014 9:17:26 AM      MDM   Final diagnoses:  Community  acquired pneumonia  Hypoxia  Pleural effusion associated with pulmonary infection  Atrial fibrillation, unspecified    Shortness of breath and disability appears to be secondary to right-sided pneumonia with effusion.  Oxygenation is tenuous.  She will be admission for evaluation and treatment.  I doubt sepsis, metabolic instability, or impending vascular collapse.  Nursing Notes Reviewed/ Care Coordinated, and agree without changes. Applicable Imaging Reviewed.  Interpretation of Laboratory Data incorporated into ED treatment  Plan: Admit    Richarda Blade, MD 05/19/14 [place], MD 05/20/14 [phone]

## 2014-05-19 NOTE — ED Notes (Signed)
Admitting at bedside 

## 2014-05-19 NOTE — ED Notes (Signed)
Patient was given a [place] sandwich bag with a sprite.

## 2014-05-19 NOTE — ED Notes (Signed)
Pt attempted to use bedside commode, unable to obtain sample. In and out performed with Opal Sidles, NT at bedside to assist.

## 2014-05-19 NOTE — Progress Notes (Signed)
ANTICOAGULATION CONSULT NOTE - Initial Consult  Pharmacy Consult for heparin Indication: atrial fibrillation  Allergies  Allergen Reactions  . Bactrim [Sulfamethoxazole-Trimethoprim] Other (See Comments)    Makes her very strange   . Amitriptyline Hcl Rash  . Aspirin Swelling and Rash  . Zetia [Ezetimibe] Rash    Patient Measurements: Height: 5\' 5"  (165.1 cm) Weight: 141 lb 15.6 oz (64.4 kg) IBW/kg (Calculated) : 57 Heparin Dosing Weight: 64kg  Vital Signs: Temp: 99.3 F (37.4 C) (12/28 1848) Temp Source: Oral (12/28 1848) BP: 151/56 mmHg (12/28 1848) Pulse Rate: 55 (12/28 1700)  Labs:  Recent Labs  05/19/14 1103  HGB 10.8*  HCT 34.1*  PLT 188  CREATININE 0.40*    Estimated Creatinine Clearance: 47.9 mL/min (by C-G formula based on Cr of 0.4).   Medical History: Past Medical History  Diagnosis Date  . Arthritis   . Asthma   . GERD (gastroesophageal reflux disease)   . Hyperlipidemia   . Myocardial infarction 2003    Stent to CFX  . Allergy   . Hypertension   . Eczema   . ASHD (arteriosclerotic heart disease)   . CAD (coronary artery disease) 06/2001  . Bronchitis   . Stroke     Medications:  Scheduled:  . sodium chloride   Intravenous STAT  . azithromycin  500 mg Oral Q24H  . [START ON 05/20/2014] calcium-vitamin D  1 tablet Oral Q breakfast  . cefTRIAXone (ROCEPHIN)  IV  1 g Intravenous Q24H  . cholecalciferol  1,000 Units Oral Daily  . clopidogrel  75 mg Oral Daily  . diltiazem  120 mg Oral Daily  . fluticasone  2 puff Inhalation BID  . guaiFENesin  600 mg Oral BID  . irbesartan  75 mg Oral Daily  . isosorbide dinitrate  30 mg Oral BID  . metoprolol succinate  25 mg Oral Daily  . pantoprazole  40 mg Oral Daily  . [START ON 05/20/2014] rosuvastatin  40 mg Oral q1800  . sodium chloride  3 mL Intravenous Q12H  . sodium chloride  3 mL Intravenous Q12H   Infusions:    Assessment: 80 yo who was admitted for afib. This is new since 3/15.  Plan is to use IV heparin then eval for NOAC.   Goal of Therapy:  Heparin level 0.3-0.7 units/ml Monitor platelets by anticoagulation protocol: Yes   Plan:   Heparin boluse 3000 units x1 Heparin at 950 units/hr F/u with 8 hr level Daily level and CBC  Onnie Boer, PharmD Pager: [phone] 05/19/2014 7:37 PM

## 2014-05-19 NOTE — Telephone Encounter (Signed)
Received direct call from operator and spoke with pt.   At last office visit shortness of breath with exertion was noted but pt reports for last 4-5 days she has been short of breath all the time. Occurs at rest. Productive yellow cough. No fever. No increase in swelling--notes chronic swelling of left foot which has been evaluated by ortho. She is short of breath while speaking with me on the phone.  Reports she has asthma and bronchitis but has not seen pulmonary recently. I instructed pt to go to ED for evaluation. She will have daughter take her.

## 2014-05-19 NOTE — ED Notes (Signed)
Pt c/o SOB and cough; pt sts hx of chronic bronchitis

## 2014-05-19 NOTE — Consult Note (Signed)
CARDIOLOGY CONSULT NOTE     Patient ID: [name] MRN: [id number] DOB/AGE: 26-Dec-1928 80 y.o.  Admit date: 05/19/2014 Referring Physician Marguarite Arbour MD Primary Physician Wyatt Haste, MD Primary Cardiologist Darlina Guys MD Reason for Consultation Atrial fibrillation.  HPI: 80 yo WF seen at the request of the Hospitalist service for evaluation of atrial fibrillation. She has a history of chronic bronchitis and asthma. She presents with worsening SOB. CXR shows evidence of CAP involving the RML. On admission she was noted to be in Afib with controlled ventricular response. This is new since March 2015. She notes that her pulse felt irregular but otherwise denies any palpitations, chest pain, or dizziness. She has a history of  CAD status post bypass surgery in September 2006 (Dr. Prescott Gum) as well as a Cox-Maze procedure for atrial fibrillation, CVA, carotid artery stenosis s/p R CEA, fibromyalgia, GERD, hiatal hernia, severe kyphoscoloisis, moderate MR, moderate AS.  Myoview 8/10: EF 70% small lateral infarct no ischemia. I saw her in February 2013 and she had c/o occasional chest pains which were atypical. Carotid artery dopplers April 2013 with 40-59% bilateral stenosis with stable RCEA site (she has refused repeat testing). She was seen in the ED on 11/12/11 and had negative cardiac markers and no EKG changes. Admitted to [facility] 07/30/12 with lateral wall rib pain. She had an Echo in September 2015 which showed moderate AS and moderate MR. EF was normal.  Past Medical History  Diagnosis Date  . Arthritis   . Asthma   . GERD (gastroesophageal reflux disease)   . Hyperlipidemia   . Myocardial infarction 2003    Stent to CFX  . Allergy   . Hypertension   . Eczema   . ASHD (arteriosclerotic heart disease)   . CAD (coronary artery disease) 06/2001  . Bronchitis   . Stroke     Family History  Problem Relation Age of Onset  . Heart failure Mother   . Other Father       History   Social History  . Marital Status: Widowed    Spouse Name: N/A    Number of Children: 3  . Years of Education: N/A   Occupational History  .     Social History Main Topics  . Smoking status: Never Smoker   . Smokeless tobacco: Former Systems developer    Types: Snuff    Quit date: 05/23/2001  . Alcohol Use: No  . Drug Use: No  . Sexual Activity: Not Currently   Other Topics Concern  . Not on file   Social History Narrative    Past Surgical History  Procedure Laterality Date  . Abdominal hysterectomy  1976  . Carotid endarterectomy  2010  . Coronary artery bypass graft  01/2005    LIMA-D1; SVG-LAD; SVG-OM; SVG-PDA  . Tonsillectomy    . Cardiac surgery  stents  . Tumor removal         Medication List    ASK your doctor about these medications        albuterol 108 (90 BASE) MCG/ACT inhaler  Commonly known as:  PROAIR HFA  Inhale 2 puffs into the lungs every 4 (four) hours as needed for wheezing.     calcium-vitamin D 500-200 MG-UNIT per tablet  Commonly known as:  OSCAL WITH D  Take 1 tablet by mouth daily with breakfast.     cholecalciferol 1000 UNITS tablet  Commonly known as:  VITAMIN D  Take 1,000 Units by mouth daily.  clopidogrel 75 MG tablet  Commonly known as:  PLAVIX  Take 1 tablet (75 mg total) by mouth daily.     diazepam 2 MG tablet  Commonly known as:  VALIUM  Take 2 mg by mouth every 6 (six) hours as needed (dizziness).     diltiazem 120 MG 24 hr capsule  Commonly known as:  CARDIZEM CD  Take 1 capsule (120 mg total) by mouth daily.     fluticasone 110 MCG/ACT inhaler  Commonly known as:  FLOVENT HFA  inhale 1 puff by mouth twice a day     fluticasone 50 MCG/ACT nasal spray  Commonly known as:  FLONASE  instill 2 sprays into each nostril once daily     guaiFENesin 100 MG/5ML liquid  Commonly known as:  ROBITUSSIN  Take 200 mg by mouth 3 (three) times daily as needed (Chronic bronchitis).     guaiFENesin 600 MG 12 hr tablet   Commonly known as:  MUCINEX  Take 600 mg by mouth 2 (two) times daily.     isosorbide dinitrate 30 MG tablet  Commonly known as:  ISORDIL  Take 1 tablet (30 mg total) by mouth 2 (two) times daily.     loratadine 10 MG tablet  Commonly known as:  CLARITIN  Take 10 mg by mouth daily.     metoprolol succinate 25 MG 24 hr tablet  Commonly known as:  TOPROL-XL  take 1 tablet by mouth once daily     nitroGLYCERIN 0.4 MG SL tablet  Commonly known as:  NITROSTAT  Place 0.4 mg under the tongue every 5 (five) minutes as needed for chest pain (MAX 3 Tablets).     omeprazole 20 MG capsule  Commonly known as:  PRILOSEC  take 2 capsules by mouth once daily     rosuvastatin 40 MG tablet  Commonly known as:  CRESTOR  take 1 tablet by mouth once daily     traMADol 50 MG tablet  Commonly known as:  ULTRAM  Take 100 mg by mouth 3 (three) times daily as needed (pain).     traMADol 50 MG tablet  Commonly known as:  ULTRAM  TAKE 2 TABLETS BY MOUTH 3 TIMES A DAY AS NEEDED FOR PAIN     valsartan 40 MG tablet  Commonly known as:  DIOVAN  Take 1 tablet (40 mg total) by mouth daily.         ROS: As noted in HPI. All other systems are reviewed and are negative unless otherwise mentioned.   Physical Exam: Blood pressure 102/51, pulse 55, temperature 98.1 F (36.7 C), temperature source Oral, resp. rate 22, height 5\' 5"  (1.651 m), weight 142 lb (64.411 kg), SpO2 96 %. Current Weight  05/19/14 142 lb (64.411 kg)  02/07/14 145 lb 12.8 oz (66.134 kg)  07/26/13 146 lb 1.9 oz (66.28 kg)    GENERAL:  Elderly WF in NAD HEENT:  PERRL, EOMI, sclera are clear. Oropharynx is clear. NECK:  No jugular venous distention, carotid upstroke brisk and symmetric, no bruits, no thyromegaly or adenopathy LUNGS:  Bilateral coarse crackles. CHEST:  Unremarkable HEART:  IRRR,  PMI not displaced or sustained,S1 and S2 within normal limits, no S3, no S4: Harsh gr 3/6 systolic murmur RUSB and apex. ABD:  Soft,  nontender. BS +, no masses or bruits. No hepatomegaly, no splenomegaly EXT:  2 + pulses throughout, no edema, no cyanosis no clubbing SKIN:  Warm and dry.  No rashes NEURO:  Alert and oriented x  3. Cranial nerves II through XII intact. PSYCH:  Cognitively intact    Labs:   Lab Results  Component Value Date   WBC 8.1 05/19/2014   HGB 10.8* 05/19/2014   HCT 34.1* 05/19/2014   MCV 94.7 05/19/2014   PLT 188 05/19/2014    Recent Labs Lab 05/19/14 1103  NA 140  K 3.9  CL 108  CO2 23  BUN 9  CREATININE 0.40*  CALCIUM 9.0  GLUCOSE 112*   Lab Results  Component Value Date   TROPONINI <0.30 07/31/2012   TROPONINI <0.30 07/30/2012   TROPONINI <0.30 07/30/2012   Lab Results  Component Value Date   CHOL 149 06/04/2013   CHOL 161 12/05/2011   CHOL 148 10/05/2010   Lab Results  Component Value Date   HDL 60 06/04/2013   HDL 58 12/05/2011   HDL 52 10/05/2010   Lab Results  Component Value Date   LDLCALC 67 06/04/2013   LDLCALC 76 12/05/2011   LDLCALC 69 10/05/2010   Lab Results  Component Value Date   TRIG 108 06/04/2013   TRIG 133 12/05/2011   TRIG 137 10/05/2010   Lab Results  Component Value Date   CHOLHDL 2.5 06/04/2013   CHOLHDL 2.8 12/05/2011   CHOLHDL 2.8 10/05/2010   No results found for: LDLDIRECT  No results found for: PROBNP No results found for: TSH No results found for: HGBA1C  Radiology: Dg Chest 2 View  05/19/2014   CLINICAL DATA:  Chronic bronchitis, shortness of breath for 3-4 days, cough.  EXAM: CHEST  2 VIEW  COMPARISON:  07/30/2012.  FINDINGS: Trachea is midline. Heart size within normal limits. Thoracic aorta is calcified. There is right middle lobe consolidation with a small right pleural effusion. Probable mild scarring in the left lung base. Biapical pleural thickening. Scoliosis.  IMPRESSION: 1. Airspace consolidation in the right middle lobe is most likely due to pneumonia, with a small right parapneumonic effusion. Follow-up to  clearing is recommended, to exclude underlying malignancy. 2. Favor scarring at the left lung base. Difficult to exclude early/mild pneumonia.   Electronically Signed   By: Lorin Picket M.D.   On: 05/19/2014 12:12    EKG: Atrial fibrillation rate 82. No acute changes.  Echo: 01/24/14:Study Conclusions  - Left ventricle: The cavity size was normal. Wall thickness was increased in a pattern of mild LVH. There was mild concentric hypertrophy. Systolic function was normal. The estimated ejection fraction was in the range of 55% to 65%. Wall motion was normal; there were no regional wall motion abnormalities. Doppler parameters are consistent with a reversible restrictive pattern, indicative of decreased left ventricular diastolic compliance and/or increased left atrial pressure (grade 3 diastolic dysfunction). - Aortic valve: Valve mobility was restricted. There was moderate stenosis. Peak velocity (S): 354 cm/s. (prior echo 347 cm/s). Mean gradient (S): 28 mm Hg. - Mitral valve: Calcified annulus. Mildly thickened, moderately calcified leaflets . There was moderate regurgitation.  ASSESSMENT AND PLAN:  1. Atrial fibrillation. Paroxysmal. New since March 2015 but otherwise duration unknown. Possibly triggered by CAP. Rate is well controlled on metoprolol and diltiazem. Recent Echo noted above. Would check TSH. Will observe on telemetry. She is asymptomatic so I don't think DCCV is indicated at this time. We do need to consider anticoagulation. CHAD-VAsc score is 6-7 so she is at high risk of stroke. Will start heparin IV. Will ask Case mangement to see. It may be preferable to use a NOAC if she can afford. Although she has  moderate MR and AS I don't think her afib is valvular with MAZE procedure dating back to 2006.  2. CAP- per the hospital team 3. Chronic bronchitis/asthma. 4. CAD s/p CABG 5. Moderate AS 6 Moderate MR. 7. History of CVA 8. Carotid arterial disease  s/p right CEA.   Signed: Tylan Briguglio [place], [place]  05/19/2014, 6:26 PM

## 2014-05-20 DIAGNOSIS — I480 Paroxysmal atrial fibrillation: Secondary | ICD-10-CM | POA: Insufficient documentation

## 2014-05-20 DIAGNOSIS — I4891 Unspecified atrial fibrillation: Secondary | ICD-10-CM

## 2014-05-20 LAB — PROTIME-INR
INR: 1.17 (ref 0.00–1.49)
PROTHROMBIN TIME: 15 s (ref 11.6–15.2)

## 2014-05-20 LAB — HEPARIN LEVEL (UNFRACTIONATED): HEPARIN UNFRACTIONATED: 0.35 [IU]/mL (ref 0.30–0.70)

## 2014-05-20 LAB — URINE CULTURE
COLONY COUNT: NO GROWTH
Culture: NO GROWTH

## 2014-05-20 LAB — LEGIONELLA ANTIGEN, URINE

## 2014-05-20 LAB — HIV ANTIBODY (ROUTINE TESTING W REFLEX): HIV: NONREACTIVE

## 2014-05-20 LAB — CBC
HCT: 34 % — ABNORMAL LOW (ref 36.0–46.0)
HEMOGLOBIN: 10.6 g/dL — AB (ref 12.0–15.0)
MCH: 29.4 pg (ref 26.0–34.0)
MCHC: 31.2 g/dL (ref 30.0–36.0)
MCV: 94.4 fL (ref 78.0–100.0)
Platelets: 199 10*3/uL (ref 150–400)
RBC: 3.6 MIL/uL — ABNORMAL LOW (ref 3.87–5.11)
RDW: 14 % (ref 11.5–15.5)
WBC: 7.7 10*3/uL (ref 4.0–10.5)

## 2014-05-20 LAB — INFLUENZA PANEL BY PCR (TYPE A & B)
H1N1 flu by pcr: NOT DETECTED
INFLAPCR: NEGATIVE
INFLBPCR: NEGATIVE

## 2014-05-20 LAB — STREP PNEUMONIAE URINARY ANTIGEN: Strep Pneumo Urinary Antigen: NEGATIVE

## 2014-05-20 MED ORDER — APIXABAN 5 MG PO TABS
5.0000 mg | ORAL_TABLET | Freq: Two times a day (BID) | ORAL | Status: DC
  Administered 2014-05-20 – 2014-05-25 (×11): 5 mg via ORAL
  Filled 2014-05-20 (×12): qty 1

## 2014-05-20 NOTE — Plan of Care (Signed)
Problem: Phase I Progression Outcomes Goal: OOB as tolerated unless otherwise ordered Outcome: Completed/Met Date Met:  05/20/14 Up with assist with walker to bathroom

## 2014-05-20 NOTE — Progress Notes (Signed)
[place] for heparin Indication: atrial fibrillation  Allergies  Allergen Reactions  . Bactrim [Sulfamethoxazole-Trimethoprim] Other (See Comments)    Makes her very strange   . Amitriptyline Hcl Rash  . Aspirin Swelling and Rash  . Zetia [Ezetimibe] Rash    Patient Measurements: Height: 5\' 5"  (165.1 cm) Weight: 141 lb 15.6 oz (64.4 kg) IBW/kg (Calculated) : 57 Heparin Dosing Weight: 64kg  Vital Signs: Temp: 98.1 F (36.7 C) (12/29 0522) Temp Source: Oral (12/29 0522) BP: 142/71 mmHg (12/29 0522) Pulse Rate: 83 (12/29 0522)  Labs:  Recent Labs  05/19/14 1103 05/19/14 1937 05/20/14 0438  HGB 10.8* 11.1* 10.6*  HCT 34.1* 35.2* 34.0*  PLT 188 205 199  LABPROT  --   --  15.0  INR  --   --  1.17  HEPARINUNFRC  --   --  0.35  CREATININE 0.40* 0.49*  --     Estimated Creatinine Clearance: 47.9 mL/min (by C-G formula based on Cr of 0.49).  Assessment: 80 yo female with Afib for heparin  Goal of Therapy:  Heparin level 0.3-0.7 units/ml Monitor platelets by anticoagulation protocol: Yes   Plan:  Continue Heparin at current rate F/U plan for oral anticoagulation  Phillis Knack, PharmD, BCPS  05/20/2014 5:39 AM

## 2014-05-20 NOTE — Progress Notes (Signed)
[facility] PROGRESS NOTE  [name]:[id number] DOB: April 18, 1929 DOA: 05/19/2014 PCP: Wyatt Haste, MD  Assessment/Plan: 1-PNA; continue with ceftriaxone and azithromycin.   2-Asthma; continue with nebulizer treatments.   3-A fib started on eliquis.  Cardiology following.  Continue with metoprolol and Cardizem. Holder parameter for BP.   Code Status: full code.  Family Communication: Care discussed with daughter  Disposition Plan: remain inpatient.    Consultants:  cardiology  Procedures:  none  Antibiotics:  Ceftriaxone 12-28  Azithromycin 12-29  HPI/Subjective: Relates feeling better today, she is breathing better.   Objective: Filed Vitals:   05/20/14 0550  BP:   Pulse:   Temp:   Resp: 22    Intake/Output Summary (Last 24 hours) at 05/20/14 0851 Last data filed at 05/20/14 0542  Gross per 24 hour  Intake  537.5 ml  Output    350 ml  Net  187.5 ml   Filed Weights   05/19/14 1551 05/19/14 1847  Weight: 64.411 kg (142 lb) 64.4 kg (141 lb 15.6 oz)    Exam:   General:  Alert in no distress.   Cardiovascular: S 1, S 2 IRR  Respiratory: bilateral wheezing.   Abdomen: BS present , soft , no tender.   Musculoskeletal: no edema.   Data Reviewed: Basic Metabolic Panel:  Recent Labs Lab 05/19/14 1103 05/19/14 1937  NA 140  --   K 3.9  --   CL 108  --   CO2 23  --   GLUCOSE 112*  --   BUN 9  --   CREATININE 0.40* 0.49*  CALCIUM 9.0  --    Liver Function Tests: No results for input(s): AST, ALT, ALKPHOS, BILITOT, PROT, ALBUMIN in the last 168 hours. No results for input(s): LIPASE, AMYLASE in the last 168 hours. No results for input(s): AMMONIA in the last 168 hours. CBC:  Recent Labs Lab 05/19/14 1103 05/19/14 1937 05/20/14 0438  WBC 8.1 6.3 7.7  NEUTROABS 6.4  --   --   HGB 10.8* 11.1* 10.6*  HCT 34.1* 35.2* 34.0*  MCV 94.7 94.1 94.4  PLT 188 205 199   Cardiac Enzymes: No results for input(s):  CKTOTAL, CKMB, CKMBINDEX, TROPONINI in the last 168 hours. BNP (last 3 results) No results for input(s): PROBNP in the last 8760 hours. CBG: No results for input(s): GLUCAP in the last 168 hours.  No results found for this or any previous visit (from the past 240 hour(s)).   Studies: Dg Chest 2 View  05/19/2014   CLINICAL DATA:  Chronic bronchitis, shortness of breath for 3-4 days, cough.  EXAM: CHEST  2 VIEW  COMPARISON:  07/30/2012.  FINDINGS: Trachea is midline. Heart size within normal limits. Thoracic aorta is calcified. There is right middle lobe consolidation with a small right pleural effusion. Probable mild scarring in the left lung base. Biapical pleural thickening. Scoliosis.  IMPRESSION: 1. Airspace consolidation in the right middle lobe is most likely due to pneumonia, with a small right parapneumonic effusion. Follow-up to clearing is recommended, to exclude underlying malignancy. 2. Favor scarring at the left lung base. Difficult to exclude early/mild pneumonia.   Electronically Signed   By: Lorin Picket M.D.   On: 05/19/2014 12:12    Scheduled Meds: . antiseptic oral rinse  7 mL Mouth Rinse BID  . azithromycin  500 mg Oral Q24H  . calcium-vitamin D  1 tablet Oral Q breakfast  . cefTRIAXone (ROCEPHIN)  IV  1 g Intravenous Q24H  .  cholecalciferol  1,000 Units Oral Daily  . clopidogrel  75 mg Oral Daily  . diltiazem  120 mg Oral Daily  . fluticasone  2 puff Inhalation BID  . guaiFENesin  600 mg Oral BID  . irbesartan  75 mg Oral Daily  . isosorbide dinitrate  30 mg Oral BID  . metoprolol succinate  25 mg Oral Daily  . pantoprazole  40 mg Oral Daily  . rosuvastatin  40 mg Oral q1800  . sodium chloride  3 mL Intravenous Q12H  . sodium chloride  3 mL Intravenous Q12H   Continuous Infusions: . heparin 950 Units/hr (05/19/14 2052)    Active Problems:   Asthma   CAP (community acquired pneumonia)   Aortic stenosis, moderate   Community acquired pneumonia    Time  spent: 35 minutes.     Niel Hummer A  [facility] Pager [phone]. If 7PM-7AM, please contact night-coverage at [email or web address], password [facility] 05/20/2014, 8:51 AM  LOS: 1 day

## 2014-05-20 NOTE — Progress Notes (Signed)
Utilization review completed. Shaia Porath, RN, BSN. 

## 2014-05-20 NOTE — Progress Notes (Addendum)
    Subjective:  Still with cough and SOB but improved.  No CP.   Objective:  Vital Signs in the last 24 hours: Temp:  [98.1 F (36.7 C)-99.3 F (37.4 C)] 98.9 F (37.2 C) (12/29 1316) Pulse Rate:  [55-83] 73 (12/29 1316) Resp:  [18-34] 25 (12/29 1316) BP: (91-151)/(48-71) 91/54 mmHg (12/29 1316) SpO2:  [93 %-98 %] 94 % (12/29 1316) Weight:  [141 lb 15.6 oz (64.4 kg)-142 lb (64.411 kg)] 141 lb 15.6 oz (64.4 kg) (12/28 1847)  Intake/Output from previous day: 12/28 0701 - 12/29 0700 In: 537.5 [I.V.:537.5] Out: 350 [Urine:350]   Physical Exam: General: Elderly, in no acute distress. Head:  Normocephalic and atraumatic. Lungs: Mild rhonchi, wheeze. Marland Kitchen Heart: Normal S1 and S2.  3/6 S murmur, no rubs or gallops.  Abdomen: soft, non-tender, positive bowel sounds. Extremities: No clubbing or cyanosis. No edema. Neurologic: Alert and oriented x 3. Right arm hematoma at prior IV site.     Lab Results:  Recent Labs  05/19/14 1937 05/20/14 0438  WBC 6.3 7.7  HGB 11.1* 10.6*  PLT 205 199    Recent Labs  05/19/14 1103 05/19/14 1937  NA 140  --   K 3.9  --   CL 108  --   CO2 23  --   GLUCOSE 112*  --   BUN 9  --   CREATININE 0.40* 0.49*  Imaging: Dg Chest 2 View  05/19/2014   CLINICAL DATA:  Chronic bronchitis, shortness of breath for 3-4 days, cough.  EXAM: CHEST  2 VIEW  COMPARISON:  07/30/2012.  FINDINGS: Trachea is midline. Heart size within normal limits. Thoracic aorta is calcified. There is right middle lobe consolidation with a small right pleural effusion. Probable mild scarring in the left lung base. Biapical pleural thickening. Scoliosis.  IMPRESSION: 1. Airspace consolidation in the right middle lobe is most likely due to pneumonia, with a small right parapneumonic effusion. Follow-up to clearing is recommended, to exclude underlying malignancy. 2. Favor scarring at the left lung base. Difficult to exclude early/mild pneumonia.   Electronically Signed   By:  Lorin Picket M.D.   On: 05/19/2014 12:12   Personally viewed.   Telemetry:afib rate controlled. No significant pauses.  ECHO - normal EF, mod to severe AS.     Personally viewed.  Assessment/Plan:  Active Problems:   Asthma   CAP (community acquired pneumonia)   Aortic stenosis, moderate   Community acquired pneumonia  AFIB  - will start Eliquis 5mg  PO BID, CHAD-VAsc score is 6-7  - stop IV heparin  - If can not afford Eliquis will need to transition to coumadin. She used to be on this and she does not really want to take this. "Hard on my daughter" who has to drive her to appts.   - Agree with Dr. Martinique - preferable to use NOAC, likely non valvular afib. Prior MAZE.   - Risk of bleeding discussed.   CAD  - stable post CABG  Moderate AS/MR  - following with ECHO.   CAP  - per primary team.    [name], [place] 05/20/2014, 2:03 PM

## 2014-05-21 MED ORDER — ALBUTEROL SULFATE (2.5 MG/3ML) 0.083% IN NEBU: 2.5000 mg | INHALATION_SOLUTION | RESPIRATORY_TRACT | Status: DC | PRN

## 2014-05-21 MED ORDER — METHYLPREDNISOLONE SODIUM SUCC 40 MG IJ SOLR
40.0000 mg | Freq: Two times a day (BID) | INTRAMUSCULAR | Status: DC
  Administered 2014-05-21 – 2014-05-22 (×3): 40 mg via INTRAVENOUS
  Filled 2014-05-21 (×5): qty 1

## 2014-05-21 MED ORDER — IPRATROPIUM BROMIDE 0.02 % IN SOLN: 0.5000 mg | Freq: Four times a day (QID) | RESPIRATORY_TRACT | Status: DC

## 2014-05-21 MED ORDER — ALBUTEROL SULFATE (2.5 MG/3ML) 0.083% IN NEBU
2.5000 mg | INHALATION_SOLUTION | Freq: Four times a day (QID) | RESPIRATORY_TRACT | Status: DC
  Administered 2014-05-21 – 2014-05-23 (×11): 2.5 mg via RESPIRATORY_TRACT
  Filled 2014-05-21 (×9): qty 3

## 2014-05-21 NOTE — Progress Notes (Signed)
CARE MANAGEMENT NOTE 05/21/2014  Patient:  [name], [name]   Account Number:  [id number]  Date Initiated:  05/21/2014  Documentation initiated by:  [facility]  Subjective/Objective Assessment:   afib, asthma     Action/Plan:   lives at home with dtr.   Anticipated DC Date:     Anticipated DC Plan:  [place]  CM consult      Choice offered to / List presented to:             Status of service:  In process, will continue to follow Medicare Important Message given?   (If response is "NO", the following Medicare IM given date fields will be blank) Date Medicare IM given:   Medicare IM given by:   Date Additional Medicare IM given:   Additional Medicare IM given by:    Discharge Disposition:    Per UR Regulation:    If discussed at Long Length of Stay Meetings, dates discussed:    Comments:  05/21/2014 1430 Provided pt with Eliquis 30 day free trial card to take with her to the pharmacy with her Rx. Will send for benefit check for copay price of medication. Pt states her copay usually run $2.00 at pharmacy. Uses [facility] on [address]. Pt will possible need oxygen for home. Will need qualifying sats prior to dc. Jonnie Finner RN CCM Case Mgmt phone [phone]

## 2014-05-21 NOTE — Progress Notes (Signed)
  Cards: Dr. Julianne Handler  Subjective:  improved.  No CP.   Objective:  Vital Signs in the last 24 hours: Temp:  [98.2 F (36.8 C)-98.3 F (36.8 C)] 98.2 F (36.8 C) (12/30 0549) Pulse Rate:  [93-109] 109 (12/30 0900) Resp:  [18-31] 18 (12/30 0900) BP: (100-125)/(46-80) 125/80 mmHg (12/30 0900) SpO2:  [93 %-96 %] 95 % (12/30 0900)  Intake/Output from previous day: 12/29 0701 - 12/30 0700 In: 783.4 [P.O.:580; I.V.:153.4; IV Piggyback:50] Out: 1050 [Urine:1050]   Physical Exam: General: Elderly, in no acute distress. Head:  Normocephalic and atraumatic. Lungs: Mild rhonchi, wheeze. Marland Kitchen Heart: Normal S1 and S2.  3/6 S murmur, no rubs or gallops.  Abdomen: soft, non-tender, positive bowel sounds. Extremities: No clubbing or cyanosis. No edema. Neurologic: Alert and oriented x 3. Right arm hematoma at prior IV site.     Lab Results:  Recent Labs  05/19/14 1937 05/20/14 0438  WBC 6.3 7.7  HGB 11.1* 10.6*  PLT 205 199    Recent Labs  05/19/14 1103 05/19/14 1937  NA 140  --   K 3.9  --   CL 108  --   CO2 23  --   GLUCOSE 112*  --   BUN 9  --   CREATININE 0.40* 0.49*  Imaging: No results found. Personally viewed.   Telemetry:afib rate controlled. No significant pauses.  ECHO - normal EF, mod to severe AS.    Scheduled Meds: . albuterol  2.5 mg Nebulization Q6H  . antiseptic oral rinse  7 mL Mouth Rinse BID  . apixaban  5 mg Oral BID  . azithromycin  500 mg Oral Q24H  . calcium-vitamin D  1 tablet Oral Q breakfast  . cefTRIAXone (ROCEPHIN)  IV  1 g Intravenous Q24H  . cholecalciferol  1,000 Units Oral Daily  . diltiazem  120 mg Oral Daily  . fluticasone  2 puff Inhalation BID  . guaiFENesin  600 mg Oral BID  . isosorbide dinitrate  30 mg Oral BID  . methylPREDNISolone (SOLU-MEDROL) injection  40 mg Intravenous Q12H  . metoprolol succinate  25 mg Oral Daily  . pantoprazole  40 mg Oral Daily  . rosuvastatin  40 mg Oral q1800  . sodium chloride  3 mL  Intravenous Q12H  . sodium chloride  3 mL Intravenous Q12H   Continuous Infusions:  PRN Meds:.sodium chloride, acetaminophen **OR** acetaminophen, albuterol, diazepam, guaifenesin, nitroGLYCERIN, ondansetron **OR** ondansetron (ZOFRAN) IV, polyethylene glycol, sodium chloride, traMADol    Personally viewed.  Assessment/Plan:  Active Problems:   Asthma   CAP (community acquired pneumonia)   Aortic stenosis, moderate   Community acquired pneumonia   Atrial fibrillation, unspecified  AFIB  - rate controlled, dilt, metop  - Eliquis 5mg  PO BID, CHAD-VAsc score is 6-7  - If can not afford Eliquis will need to transition to coumadin. She used to be on this and she does not really want to take this. "Hard on my daughter" who has to drive her to appts.   - Agree with Dr. Martinique - preferable to use NOAC, likely non valvular afib. Prior MAZE.   - Risk of bleeding discussed.   CAD  - stable post CABG  Moderate AS/MR  - following with ECHO.   CAP  - per primary team.    [name], [place] 05/21/2014, 1:25 PM

## 2014-05-21 NOTE — Progress Notes (Signed)
[facility] PROGRESS NOTE  [name] YTK:[id number] DOB: 1929/04/10 DOA: 05/19/2014 PCP: Wyatt Haste, MD  Assessment/Plan: 1-PNA; continue with ceftriaxone and azithromycin day 2. .  Repeat chest x ray tomorrow.   2-Asthma; continue with nebulizer treatments. Add solumedrol.   3-A fib started on eliquis.  Cardiology following.  Continue with metoprolol and Cardizem. Holder parameter for BP.   4-Acute hypoxic Respiratory Failure;  In setting of PNA, and Asthma, chronic bronchitis.  Will schedule nebulizer, add solumedrol.   Code Status: full code.  Family Communication: Care discussed with daughter  Disposition Plan: remain inpatient.    Consultants:  cardiology  Procedures:  none  Antibiotics:  Ceftriaxone 12-28  Azithromycin 12-29  HPI/Subjective: Breathing better, but still with significant SOB on exertion.    Objective: Filed Vitals:   05/21/14 0900  BP: 125/80  Pulse: 109  Temp:   Resp: 18    Intake/Output Summary (Last 24 hours) at 05/21/14 1307 Last data filed at 05/21/14 1108  Gross per 24 hour  Intake    630 ml  Output   1250 ml  Net   -620 ml   Filed Weights   05/19/14 1551 05/19/14 1847  Weight: 64.411 kg (142 lb) 64.4 kg (141 lb 15.6 oz)    Exam:   General:  Alert in no distress.   Cardiovascular: S 1, S 2 IRR  Respiratory: bilateral wheezing.   Abdomen: BS present , soft , no tender.   Musculoskeletal: no edema.   Data Reviewed: Basic Metabolic Panel:  Recent Labs Lab 05/19/14 1103 05/19/14 1937  NA 140  --   K 3.9  --   CL 108  --   CO2 23  --   GLUCOSE 112*  --   BUN 9  --   CREATININE 0.40* 0.49*  CALCIUM 9.0  --    Liver Function Tests: No results for input(s): AST, ALT, ALKPHOS, BILITOT, PROT, ALBUMIN in the last 168 hours. No results for input(s): LIPASE, AMYLASE in the last 168 hours. No results for input(s): AMMONIA in the last 168 hours. CBC:  Recent Labs Lab 05/19/14 1103  05/19/14 1937 05/20/14 0438  WBC 8.1 6.3 7.7  NEUTROABS 6.4  --   --   HGB 10.8* 11.1* 10.6*  HCT 34.1* 35.2* 34.0*  MCV 94.7 94.1 94.4  PLT 188 205 199   Cardiac Enzymes: No results for input(s): CKTOTAL, CKMB, CKMBINDEX, TROPONINI in the last 168 hours. BNP (last 3 results) No results for input(s): PROBNP in the last 8760 hours. CBG: No results for input(s): GLUCAP in the last 168 hours.  Recent Results (from the past 240 hour(s))  Blood Culture (routine x 2)     Status: None (Preliminary result)   Collection Time: 05/19/14  1:06 PM  Result Value Ref Range Status   Specimen Description BLOOD RIGHT FOREARM  Final   Special Requests BOTTLES DRAWN AEROBIC ONLY [place]  Final   Culture   Final           BLOOD CULTURE RECEIVED NO GROWTH TO DATE CULTURE WILL BE HELD FOR 5 DAYS BEFORE ISSUING A FINAL NEGATIVE REPORT Performed at [facility]    Report Status PENDING  Incomplete  Blood Culture (routine x 2)     Status: None (Preliminary result)   Collection Time: 05/19/14  1:25 PM  Result Value Ref Range Status   Specimen Description BLOOD RIGHT FOREARM  Final   Special Requests BOTTLES DRAWN AEROBIC AND ANAEROBIC 5CC  Final  Culture   Final           BLOOD CULTURE RECEIVED NO GROWTH TO DATE CULTURE WILL BE HELD FOR 5 DAYS BEFORE ISSUING A FINAL NEGATIVE REPORT Performed at [facility]    Report Status PENDING  Incomplete  Urine culture     Status: None   Collection Time: 05/19/14  4:01 PM  Result Value Ref Range Status   Specimen Description URINE, CATHETERIZED  Final   Special Requests NONE  Final   Colony Count NO GROWTH Performed at [facility]   Final   Culture NO GROWTH Performed at [facility]   Final   Report Status 05/20/2014 FINAL  Final     Studies: No results found.  Scheduled Meds: . albuterol  2.5 mg Nebulization Q6H  . antiseptic oral rinse  7 mL Mouth Rinse BID  . apixaban  5 mg Oral BID  . azithromycin  500 mg Oral  Q24H  . calcium-vitamin D  1 tablet Oral Q breakfast  . cefTRIAXone (ROCEPHIN)  IV  1 g Intravenous Q24H  . cholecalciferol  1,000 Units Oral Daily  . diltiazem  120 mg Oral Daily  . fluticasone  2 puff Inhalation BID  . guaiFENesin  600 mg Oral BID  . isosorbide dinitrate  30 mg Oral BID  . methylPREDNISolone (SOLU-MEDROL) injection  40 mg Intravenous Q12H  . metoprolol succinate  25 mg Oral Daily  . pantoprazole  40 mg Oral Daily  . rosuvastatin  40 mg Oral q1800  . sodium chloride  3 mL Intravenous Q12H  . sodium chloride  3 mL Intravenous Q12H   Continuous Infusions:    Active Problems:   Asthma   CAP (community acquired pneumonia)   Aortic stenosis, moderate   Community acquired pneumonia   Atrial fibrillation, unspecified    Time spent: 35 minutes.     Niel Hummer A  [facility] Pager [phone] . If 7PM-7AM, please contact night-coverage at [email or web address], password [facility] 05/21/2014, 1:07 PM  LOS: 2 days

## 2014-05-21 NOTE — Progress Notes (Signed)
05/21/2014 1630 S/W AMANDA @ [facility] #  [phone]  ELIQUIS 5 MG BID 30 DAY SUPPLY  COVER- YES CO-PAY- $ 37.00  30 DAY SUPPLY TIER- 2 DRUG LEVEL PRIOR APPROVAL - NO PHARMACY- ANY RETAIL Jonnie Finner RN CCM Case Mgmt phone [phone]

## 2014-05-22 ENCOUNTER — Inpatient Hospital Stay (HOSPITAL_COMMUNITY): Payer: Medicare Other

## 2014-05-22 LAB — CBC
HCT: 32.2 % — ABNORMAL LOW (ref 36.0–46.0)
Hemoglobin: 10.2 g/dL — ABNORMAL LOW (ref 12.0–15.0)
MCH: 29.5 pg (ref 26.0–34.0)
MCHC: 31.7 g/dL (ref 30.0–36.0)
MCV: 93.1 fL (ref 78.0–100.0)
Platelets: 188 10*3/uL (ref 150–400)
RBC: 3.46 MIL/uL — ABNORMAL LOW (ref 3.87–5.11)
RDW: 13.9 % (ref 11.5–15.5)
WBC: 4.9 10*3/uL (ref 4.0–10.5)

## 2014-05-22 LAB — BASIC METABOLIC PANEL
ANION GAP: 10 (ref 5–15)
BUN: 14 mg/dL (ref 6–23)
CO2: 23 mmol/L (ref 19–32)
Calcium: 9.4 mg/dL (ref 8.4–10.5)
Chloride: 106 mEq/L (ref 96–112)
Creatinine, Ser: 0.49 mg/dL — ABNORMAL LOW (ref 0.50–1.10)
GFR calc Af Amer: >90 mL/min (ref >90)
GFR calc non Af Amer: 88 mL/min — ABNORMAL LOW (ref >90)
GLUCOSE: 175 mg/dL — AB (ref 70–99)
POTASSIUM: 4.1 mmol/L (ref 3.5–5.1)
Sodium: 139 mmol/L (ref 135–145)

## 2014-05-22 MED ORDER — LEVOFLOXACIN 500 MG PO TABS
500.0000 mg | ORAL_TABLET | Freq: Every day | ORAL | Status: DC
  Administered 2014-05-22 – 2014-05-25 (×4): 500 mg via ORAL
  Filled 2014-05-22 (×4): qty 1

## 2014-05-22 MED ORDER — APIXABAN 5 MG PO TABS: 5.0000 mg | ORAL_TABLET | Freq: Two times a day (BID) | ORAL | Status: DC

## 2014-05-22 MED ORDER — METHYLPREDNISOLONE SODIUM SUCC 40 MG IJ SOLR
40.0000 mg | Freq: Every day | INTRAMUSCULAR | Status: DC
  Administered 2014-05-23: 40 mg via INTRAVENOUS
  Filled 2014-05-22: qty 1

## 2014-05-22 NOTE — Discharge Instructions (Signed)

## 2014-05-22 NOTE — Progress Notes (Signed)
SATURATION QUALIFICATIONS: (This note is used to comply with regulatory documentation for home oxygen)  Patient Saturations on Room Air at Rest = 94%  Patient Saturations on Room Air while Ambulating = 91%  Patient Saturations on 2 Liters of oxygen while Ambulating = 95%  Please briefly explain why patient needs home oxygen: patient doesn't need oxygen at home

## 2014-05-22 NOTE — Progress Notes (Signed)
Patient Name: [name] Date of Encounter: 05/22/2014  Active Problems:   Asthma   CAP (community acquired pneumonia)   Aortic stenosis, moderate   Community acquired pneumonia   Atrial fibrillation, unspecified   Primary Cardiologist: Dr. Angelena Form  Patient Profile: 80 yo female w/ hx afib, mod AS (mean gradient 28, EF 55% 01/2014), chronic bronchitis and asthma was admitted 12/28 w/ CAP, Afib.  SUBJECTIVE: Feels heart pound at times, generally doing better, gets SOB with exertion, anxious about Eliquis (we worked that out).  OBJECTIVE Filed Vitals:   05/21/14 2149 05/22/14 0147 05/22/14 0531 05/22/14 0809  BP: 109/61  118/66   Pulse: 113  95 95  Temp: 98.2 F (36.8 C)  98.5 F (36.9 C)   TempSrc: Oral  Oral   Resp: 16  18 18   Height:      Weight:      SpO2: 93% 91% 91% 91%    Intake/Output Summary (Last 24 hours) at 05/22/14 1116 Last data filed at 05/22/14 1000  Gross per 24 hour  Intake    780 ml  Output    450 ml  Net    330 ml   Filed Weights   05/19/14 1551 05/19/14 1847  Weight: 142 lb (64.411 kg) 141 lb 15.6 oz (64.4 kg)    PHYSICAL EXAM General: Well developed, well nourished, female in no acute distress. Head: Normocephalic, atraumatic.  Neck: Supple without bruits, JVD minimal elevation. Lungs:  Resp regular and unlabored, dry rales bases. Heart: Irreg irreg, S1, S2, no S3, S4, or murmur; no rub. Abdomen: Soft, non-tender, non-distended, BS + x 4.  Extremities: No clubbing, cyanosis, edema.  Neuro: Alert and oriented X 3. Moves all extremities spontaneously. Psych: Normal affect.  LABS: CBC: Recent Labs  05/20/14 0438 05/22/14 0515  WBC 7.7 4.9  HGB 10.6* 10.2*  HCT 34.0* 32.2*  MCV 94.4 93.1  PLT 199 188   INR: Recent Labs  05/20/14 0438  INR 0.92   Basic Metabolic Panel: Recent Labs  05/19/14 1937 05/22/14 0515  NA  --  139  K  --  4.1  CL  --  106  CO2  --  23  GLUCOSE  --  175*  BUN  --  14  CREATININE  0.49* 0.49*  CALCIUM  --  9.4    Recent Labs  05/19/14 1356  TROPIPOC 0.01    Thyroid Function Tests: Recent Labs  05/19/14 1937  TSH 2.936    TELE:   Atrial fib, rate generally close to 100    Radiology/Studies: Dg Chest 2 View 05/22/2014   CLINICAL DATA:  Shortness of breath and cough  EXAM: CHEST  2 VIEW  COMPARISON:  May 19, 2014  FINDINGS: There is underlying emphysematous change. There are pleural effusions bilaterally, slightly larger on the right than on the left with bibasilar patchy atelectasis, stable. There is no new opacity. Heart is upper normal in size with pulmonary vascularity within normal limits. No adenopathy. There is lower thoracic and lumbar levoscoliosis. There is atherosclerotic change in the aorta. Patient is status post coronary artery bypass grafting.  IMPRESSION: No appreciable change compared to recent prior study. Underlying emphysema. Bilateral effusions with bibasilar atelectatic change. No new opacity. No change in cardiac silhouette. Extensive atherosclerotic calcification.   Electronically Signed   By: Lowella Grip M.D.   On: 05/22/2014 08:02     Current Medications:  . albuterol  2.5 mg Nebulization Q6H  . antiseptic oral  rinse  7 mL Mouth Rinse BID  . apixaban  5 mg Oral BID  . azithromycin  500 mg Oral Q24H  . calcium-vitamin D  1 tablet Oral Q breakfast  . cefTRIAXone (ROCEPHIN)  IV  1 g Intravenous Q24H  . cholecalciferol  1,000 Units Oral Daily  . diltiazem  120 mg Oral Daily  . fluticasone  2 puff Inhalation BID  . guaiFENesin  600 mg Oral BID  . isosorbide dinitrate  30 mg Oral BID  . methylPREDNISolone (SOLU-MEDROL) injection  40 mg Intravenous Q12H  . metoprolol succinate  25 mg Oral Daily  . pantoprazole  40 mg Oral Daily  . rosuvastatin  40 mg Oral q1800  . sodium chloride  3 mL Intravenous Q12H  . sodium chloride  3 mL Intravenous Q12H      ASSESSMENT AND PLAN: Active Problems:   Asthma - per IM    CAP  (community acquired pneumonia) - abx per IM    Aortic stenosis, moderate - Mean gradient 28 by echo 01/2014, follow    Community acquired pneumonia - per IM     AFIB - started on Eliquis 5mg  PO BID, 12/30; CHAD-VAsc score is 6-7 - Can afford Eliquis.  - Agree with Dr. [place] - preferable to use NOAC, likely non valvular afib. Prior MAZE.  - Risk of bleeding discussed.   Added daily weights, watch for volume overload with effusions on CXR but with AS, do not want her too dry. May need Lasix, will leave to MD.  Signed, Rosaria Ferries , PA-C 11:16 AM 05/22/2014  Agree with above. Did not get a chance to see her prior to DC.  Candee Furbish, MD

## 2014-05-22 NOTE — Progress Notes (Signed)
[facility] PROGRESS NOTE  [name] WFU:[id number] DOB: 03-Jan-1929 DOA: 05/19/2014 PCP: Wyatt Haste, MD  Assessment/Plan: 1-PNA; received ceftriaxone and azithromycin day 3. Will start Levaquin.  Repeat chest x ray PNA stable.   2-Asthma; continue with nebulizer treatments. Taper  solumedrol.   3-A fib started on eliquis.  Cardiology following.  Continue with metoprolol and Cardizem. Holder parameter for BP.   4-Acute hypoxic Respiratory Failure;  In setting of PNA, and Asthma, chronic bronchitis.  schedule nebulizer, taper solumedrol.   Code Status: full code.  Family Communication: Care discussed with daughter  Disposition Plan: remain inpatient.    Consultants:  cardiology  Procedures:  none  Antibiotics:  Ceftriaxone 12-28  Azithromycin 12-29  HPI/Subjective: She is feeling better. She has always have SOB on exertion.     Objective: Filed Vitals:   05/22/14 0809  BP:   Pulse: 95  Temp:   Resp: 18    Intake/Output Summary (Last 24 hours) at 05/22/14 1207 Last data filed at 05/22/14 1201  Gross per 24 hour  Intake    780 ml  Output    750 ml  Net     30 ml   Filed Weights   05/19/14 1551 05/19/14 1847  Weight: 64.411 kg (142 lb) 64.4 kg (141 lb 15.6 oz)    Exam:   General:  Alert in no distress.   Cardiovascular: S 1, S 2 IRR  Respiratory: CTA  Abdomen: BS present , soft , no tender.   Musculoskeletal: no edema.   Data Reviewed: Basic Metabolic Panel:  Recent Labs Lab 05/19/14 1103 05/19/14 1937 05/22/14 0515  NA 140  --  139  K 3.9  --  4.1  CL 108  --  106  CO2 23  --  23  GLUCOSE 112*  --  175*  BUN 9  --  14  CREATININE 0.40* 0.49* 0.49*  CALCIUM 9.0  --  9.4   Liver Function Tests: No results for input(s): AST, ALT, ALKPHOS, BILITOT, PROT, ALBUMIN in the last 168 hours. No results for input(s): LIPASE, AMYLASE in the last 168 hours. No results for input(s): AMMONIA in the last 168  hours. CBC:  Recent Labs Lab 05/19/14 1103 05/19/14 1937 05/20/14 0438 05/22/14 0515  WBC 8.1 6.3 7.7 4.9  NEUTROABS 6.4  --   --   --   HGB 10.8* 11.1* 10.6* 10.2*  HCT 34.1* 35.2* 34.0* 32.2*  MCV 94.7 94.1 94.4 93.1  PLT 188 205 199 188   Cardiac Enzymes: No results for input(s): CKTOTAL, CKMB, CKMBINDEX, TROPONINI in the last 168 hours. BNP (last 3 results) No results for input(s): PROBNP in the last 8760 hours. CBG: No results for input(s): GLUCAP in the last 168 hours.  Recent Results (from the past 240 hour(s))  Blood Culture (routine x 2)     Status: None (Preliminary result)   Collection Time: 05/19/14  1:06 PM  Result Value Ref Range Status   Specimen Description BLOOD RIGHT FOREARM  Final   Special Requests BOTTLES DRAWN AEROBIC ONLY [place]  Final   Culture   Final           BLOOD CULTURE RECEIVED NO GROWTH TO DATE CULTURE WILL BE HELD FOR 5 DAYS BEFORE ISSUING A FINAL NEGATIVE REPORT Performed at [facility]    Report Status PENDING  Incomplete  Blood Culture (routine x 2)     Status: None (Preliminary result)   Collection Time: 05/19/14  1:25 PM  Result  Value Ref Range Status   Specimen Description BLOOD RIGHT FOREARM  Final   Special Requests BOTTLES DRAWN AEROBIC AND ANAEROBIC 5CC  Final   Culture   Final           BLOOD CULTURE RECEIVED NO GROWTH TO DATE CULTURE WILL BE HELD FOR 5 DAYS BEFORE ISSUING A FINAL NEGATIVE REPORT Performed at [facility]    Report Status PENDING  Incomplete  Urine culture     Status: None   Collection Time: 05/19/14  4:01 PM  Result Value Ref Range Status   Specimen Description URINE, CATHETERIZED  Final   Special Requests NONE  Final   Colony Count NO GROWTH Performed at [facility]   Final   Culture NO GROWTH Performed at [facility]   Final   Report Status 05/20/2014 FINAL  Final     Studies: Dg Chest 2 View  05/22/2014   CLINICAL DATA:  Shortness of breath and cough  EXAM:  CHEST  2 VIEW  COMPARISON:  May 19, 2014  FINDINGS: There is underlying emphysematous change. There are pleural effusions bilaterally, slightly larger on the right than on the left with bibasilar patchy atelectasis, stable. There is no new opacity. Heart is upper normal in size with pulmonary vascularity within normal limits. No adenopathy. There is lower thoracic and lumbar levoscoliosis. There is atherosclerotic change in the aorta. Patient is status post coronary artery bypass grafting.  IMPRESSION: No appreciable change compared to recent prior study. Underlying emphysema. Bilateral effusions with bibasilar atelectatic change. No new opacity. No change in cardiac silhouette. Extensive atherosclerotic calcification.   Electronically Signed   By: Lowella Grip M.D.   On: 05/22/2014 08:02    Scheduled Meds: . albuterol  2.5 mg Nebulization Q6H  . antiseptic oral rinse  7 mL Mouth Rinse BID  . apixaban  5 mg Oral BID  . azithromycin  500 mg Oral Q24H  . calcium-vitamin D  1 tablet Oral Q breakfast  . cefTRIAXone (ROCEPHIN)  IV  1 g Intravenous Q24H  . cholecalciferol  1,000 Units Oral Daily  . diltiazem  120 mg Oral Daily  . fluticasone  2 puff Inhalation BID  . guaiFENesin  600 mg Oral BID  . isosorbide dinitrate  30 mg Oral BID  . [START ON 05/23/2014] methylPREDNISolone (SOLU-MEDROL) injection  40 mg Intravenous Daily  . metoprolol succinate  25 mg Oral Daily  . pantoprazole  40 mg Oral Daily  . rosuvastatin  40 mg Oral q1800  . sodium chloride  3 mL Intravenous Q12H  . sodium chloride  3 mL Intravenous Q12H   Continuous Infusions:    Active Problems:   Asthma   CAP (community acquired pneumonia)   Aortic stenosis, moderate   Community acquired pneumonia   Atrial fibrillation, unspecified    Time spent: 25 minutes.     Niel Hummer A  [facility] Pager [phone] . If 7PM-7AM, please contact night-coverage at [email or web address], password [facility] 05/22/2014, 12:07  PM  LOS: 3 days

## 2014-05-23 MED ORDER — DILTIAZEM HCL ER COATED BEADS 180 MG PO CP24
180.0000 mg | ORAL_CAPSULE | Freq: Every day | ORAL | Status: DC
  Administered 2014-05-24 – 2014-05-25 (×2): 180 mg via ORAL
  Filled 2014-05-23 (×2): qty 1

## 2014-05-23 MED ORDER — DILTIAZEM HCL 60 MG PO TABS
60.0000 mg | ORAL_TABLET | Freq: Once | ORAL | Status: AC
  Administered 2014-05-23: 60 mg via ORAL
  Filled 2014-05-23: qty 1

## 2014-05-23 MED ORDER — PREDNISONE 50 MG PO TABS
50.0000 mg | ORAL_TABLET | Freq: Every day | ORAL | Status: DC
  Administered 2014-05-24 – 2014-05-25 (×2): 50 mg via ORAL
  Filled 2014-05-23 (×3): qty 1

## 2014-05-23 MED ORDER — ALBUTEROL SULFATE (2.5 MG/3ML) 0.083% IN NEBU
2.5000 mg | INHALATION_SOLUTION | Freq: Three times a day (TID) | RESPIRATORY_TRACT | Status: DC
  Administered 2014-05-24: 2.5 mg via RESPIRATORY_TRACT
  Filled 2014-05-23: qty 3

## 2014-05-23 NOTE — Progress Notes (Signed)
[facility] PROGRESS NOTE  [name] PZW:[id number] DOB: 1928-07-20 DOA: 05/19/2014 PCP: Wyatt Haste, MD  Assessment/Plan: 1-PNA; received ceftriaxone and azithromycin day 3.  Levaquin 12-31 total day antibiotics 4.  Repeat chest x ray PNA stable.   2-Asthma; continue with nebulizer treatments. Change  Solumedrol to prednisone.   3-A fib started on eliquis.  HR increasing this morning. Discussed with dr Radford Pax. Will increase Cardizem to 180. Monitor overnight.  Cardiology following.  Continue with metoprolol and Cardizem.   4-Acute hypoxic Respiratory Failure;  In setting of PNA, and Asthma, chronic bronchitis.  schedule nebulizer, taper solumedrol.   Code Status: full code.  Family Communication: Care discussed with daughter  Disposition Plan: remain inpatient.    Consultants:  cardiology  Procedures:  none  Antibiotics:  Ceftriaxone 12-28  Azithromycin 12-29  HPI/Subjective: She is feeling better. Wants to go home.     Objective: Filed Vitals:   05/23/14 1348  BP:   Pulse: 98  Temp:   Resp: 18    Intake/Output Summary (Last 24 hours) at 05/23/14 1404 Last data filed at 05/23/14 1319  Gross per 24 hour  Intake   1280 ml  Output   1450 ml  Net   -170 ml   Filed Weights   05/19/14 1847 05/22/14 1226 05/23/14 0619  Weight: 64.4 kg (141 lb 15.6 oz) 67 kg (147 lb 11.3 oz) 67.5 kg (148 lb 13 oz)    Exam:   General:  Alert in no distress.   Cardiovascular: S 1, S 2 IRR  Respiratory: CTA  Abdomen: BS present , soft , no tender.   Musculoskeletal: no edema.   Data Reviewed: Basic Metabolic Panel:  Recent Labs Lab 05/19/14 1103 05/19/14 1937 05/22/14 0515  NA 140  --  139  K 3.9  --  4.1  CL 108  --  106  CO2 23  --  23  GLUCOSE 112*  --  175*  BUN 9  --  14  CREATININE 0.40* 0.49* 0.49*  CALCIUM 9.0  --  9.4   Liver Function Tests: No results for input(s): AST, ALT, ALKPHOS, BILITOT, PROT, ALBUMIN in the last  168 hours. No results for input(s): LIPASE, AMYLASE in the last 168 hours. No results for input(s): AMMONIA in the last 168 hours. CBC:  Recent Labs Lab 05/19/14 1103 05/19/14 1937 05/20/14 0438 05/22/14 0515  WBC 8.1 6.3 7.7 4.9  NEUTROABS 6.4  --   --   --   HGB 10.8* 11.1* 10.6* 10.2*  HCT 34.1* 35.2* 34.0* 32.2*  MCV 94.7 94.1 94.4 93.1  PLT 188 205 199 188   Cardiac Enzymes: No results for input(s): CKTOTAL, CKMB, CKMBINDEX, TROPONINI in the last 168 hours. BNP (last 3 results) No results for input(s): PROBNP in the last 8760 hours. CBG: No results for input(s): GLUCAP in the last 168 hours.  Recent Results (from the past 240 hour(s))  Blood Culture (routine x 2)     Status: None (Preliminary result)   Collection Time: 05/19/14  1:06 PM  Result Value Ref Range Status   Specimen Description BLOOD RIGHT FOREARM  Final   Special Requests BOTTLES DRAWN AEROBIC ONLY [place]  Final   Culture   Final           BLOOD CULTURE RECEIVED NO GROWTH TO DATE CULTURE WILL BE HELD FOR 5 DAYS BEFORE ISSUING A FINAL NEGATIVE REPORT Performed at [facility]    Report Status PENDING  Incomplete  Blood Culture (  routine x 2)     Status: None (Preliminary result)   Collection Time: 05/19/14  1:25 PM  Result Value Ref Range Status   Specimen Description BLOOD RIGHT FOREARM  Final   Special Requests BOTTLES DRAWN AEROBIC AND ANAEROBIC 5CC  Final   Culture   Final           BLOOD CULTURE RECEIVED NO GROWTH TO DATE CULTURE WILL BE HELD FOR 5 DAYS BEFORE ISSUING A FINAL NEGATIVE REPORT Performed at [facility]    Report Status PENDING  Incomplete  Urine culture     Status: None   Collection Time: 05/19/14  4:01 PM  Result Value Ref Range Status   Specimen Description URINE, CATHETERIZED  Final   Special Requests NONE  Final   Colony Count NO GROWTH Performed at [facility]   Final   Culture NO GROWTH Performed at [facility]   Final   Report Status  05/20/2014 FINAL  Final     Studies: Dg Chest 2 View  05/22/2014   CLINICAL DATA:  Shortness of breath and cough  EXAM: CHEST  2 VIEW  COMPARISON:  May 19, 2014  FINDINGS: There is underlying emphysematous change. There are pleural effusions bilaterally, slightly larger on the right than on the left with bibasilar patchy atelectasis, stable. There is no new opacity. Heart is upper normal in size with pulmonary vascularity within normal limits. No adenopathy. There is lower thoracic and lumbar levoscoliosis. There is atherosclerotic change in the aorta. Patient is status post coronary artery bypass grafting.  IMPRESSION: No appreciable change compared to recent prior study. Underlying emphysema. Bilateral effusions with bibasilar atelectatic change. No new opacity. No change in cardiac silhouette. Extensive atherosclerotic calcification.   Electronically Signed   By: Lowella Grip M.D.   On: 05/22/2014 08:02    Scheduled Meds: . albuterol  2.5 mg Nebulization Q6H  . antiseptic oral rinse  7 mL Mouth Rinse BID  . apixaban  5 mg Oral BID  . calcium-vitamin D  1 tablet Oral Q breakfast  . cholecalciferol  1,000 Units Oral Daily  . [START ON 05/24/2014] diltiazem  180 mg Oral Daily  . fluticasone  2 puff Inhalation BID  . guaiFENesin  600 mg Oral BID  . isosorbide dinitrate  30 mg Oral BID  . levofloxacin  500 mg Oral Daily  . methylPREDNISolone (SOLU-MEDROL) injection  40 mg Intravenous Daily  . metoprolol succinate  25 mg Oral Daily  . pantoprazole  40 mg Oral Daily  . rosuvastatin  40 mg Oral q1800  . sodium chloride  3 mL Intravenous Q12H  . sodium chloride  3 mL Intravenous Q12H   Continuous Infusions:    Active Problems:   Asthma   CAP (community acquired pneumonia)   Aortic stenosis, moderate   Community acquired pneumonia   Atrial fibrillation, unspecified    Time spent: 25 minutes.     Niel Hummer A  [facility] Pager [phone] . If 7PM-7AM, please  contact night-coverage at [email or web address], password [facility] 05/23/2014, 2:04 PM  LOS: 4 days

## 2014-05-23 NOTE — Progress Notes (Signed)
Patient with HR down on 40 s and after few minutes went back to 80 s; patient asymptomatic; denies any discomfort. MD notified. Will continue to monitor the patient.

## 2014-05-23 NOTE — Progress Notes (Signed)
Physical Therapy Evaluation Patient Details Name: [name] MRN: [id number] DOB: 07-Nov-1928 Today's Date: 05/23/2014   History of Present Illness  Patient is an 81 yo female admitted 05/19/14 with DOE.  Patient with CAP.  PMH:  Asthma, CAD, MI, cardiac stent, CABG, CVA, HTN, PAF, back pain  Clinical Impression  Patient presents with problems listed below.  Will benefit from acute PT to maximize independence prior to return home with daughter.  Patient reports she is about at her baseline functional status.  No f/u PT anticipated at d/c.    Follow Up Recommendations No PT follow up;Supervision/Assistance - 24 hour    Equipment Recommendations  None recommended by PT    Recommendations for Other Services       Precautions / Restrictions Precautions Precautions: Fall Restrictions Weight Bearing Restrictions: No      Mobility  Bed Mobility Overal bed mobility: Modified Independent                Transfers Overall transfer level: Needs assistance Equipment used: Rolling walker (2 wheeled) Transfers: Sit to/from Stand Sit to Stand: Min guard         General transfer comment: Patient relies on UE's to push herself up to standing.  Unable to reach fully upright standing position, with flexed hips/trunk, leaning on RW on forearms.  Patient reports she has been standing/walking like this for 16 years.  Ambulation/Gait Ambulation/Gait assistance: Min guard Ambulation Distance (Feet): 30 Feet Assistive device: Rolling walker (2 wheeled) Gait Pattern/deviations: Step-through pattern;Decreased stride length;Decreased step length - right;Decreased step length - left;Shuffle;Trunk flexed Gait velocity: Decreased Gait velocity interpretation: Below normal speed for age/gender General Gait Details: Patient leans on RW on her forearms, with very flexed posture.  Unable to stand upright.  Able to walk short distances with RW.  Patient reports she has been walking like this for  16 years.  Stairs            Wheelchair Mobility    Modified Rankin (Stroke Patients Only)       Balance                                             Pertinent Vitals/Pain Pain Assessment: No/denies pain    Home Living Family/patient expects to be discharged to:: Private residence Living Arrangements: Children Available Help at Discharge: Family;Available 24 hours/day (Daughter) Type of Home: House Home Access: Stairs to enter Entrance Stairs-Rails: Psychiatric nurse of Steps: 2 Home Layout: One level Home Equipment: Cane - single point;Walker - 2 wheels;Bedside commode;Wheelchair - power      Prior Function Level of Independence: Independent with assistive device(s);Needs assistance   Gait / Transfers Assistance Needed: Uses RW for gait.  Daughter assists patient on stairs.  ADL's / Homemaking Assistance Needed: Daughter assists with meal prep, heavy housekeeping, and transportation.        Hand Dominance   Dominant Hand: Right    Extremity/Trunk Assessment   Upper Extremity Assessment: Generalized weakness (Decreased shoulder ROM due to pain)           Lower Extremity Assessment: Generalized weakness      Cervical / Trunk Assessment: Other exceptions  Communication   Communication: No difficulties  Cognition Arousal/Alertness: Awake/alert Behavior During Therapy: WFL for tasks assessed/performed Overall Cognitive Status: Within Functional Limits for tasks assessed  General Comments      Exercises        Assessment/Plan    PT Assessment Patient needs continued PT services  PT Diagnosis Difficulty walking;Abnormality of gait;Generalized weakness   PT Problem List Decreased strength;Decreased activity tolerance;Decreased balance;Decreased mobility;Cardiopulmonary status limiting activity  PT Treatment Interventions DME instruction;Gait training;Stair training;Functional  mobility training;Therapeutic activities;Therapeutic exercise;Patient/family education   PT Goals (Current goals can be found in the Care Plan section) Acute Rehab PT Goals Patient Stated Goal: To go home tomorrow PT Goal Formulation: With patient Time For Goal Achievement: 05/30/14 Potential to Achieve Goals: Good    Frequency Min 3X/week   Barriers to discharge        Co-evaluation               End of Session Equipment Utilized During Treatment: Gait belt Activity Tolerance: Patient limited by fatigue Patient left: in bed;with call bell/phone within reach;with nursing/sitter in room (sitting EOB) Nurse Communication: Mobility status         Time: 1710-1745 PT Time Calculation (min) (ACUTE ONLY): 35 min   Charges:   PT Evaluation $Initial PT Evaluation Tier I: 1 Procedure PT Treatments $Gait Training: 23-37 mins   PT G CodesDespina Pole Jun 09, 2014, 6:37 PM Carita Pian. Sanjuana Kava, [place] Pager [phone]

## 2014-05-24 ENCOUNTER — Inpatient Hospital Stay (HOSPITAL_COMMUNITY): Payer: Medicare Other

## 2014-05-24 DIAGNOSIS — R000 Tachycardia, unspecified: Secondary | ICD-10-CM

## 2014-05-24 DIAGNOSIS — J4541 Moderate persistent asthma with (acute) exacerbation: Secondary | ICD-10-CM

## 2014-05-24 DIAGNOSIS — I25812 Atherosclerosis of bypass graft of coronary artery of transplanted heart without angina pectoris: Secondary | ICD-10-CM

## 2014-05-24 DIAGNOSIS — J90 Pleural effusion, not elsewhere classified: Secondary | ICD-10-CM

## 2014-05-24 DIAGNOSIS — I10 Essential (primary) hypertension: Secondary | ICD-10-CM

## 2014-05-24 DIAGNOSIS — R0609 Other forms of dyspnea: Secondary | ICD-10-CM

## 2014-05-24 LAB — BASIC METABOLIC PANEL
Anion gap: 9 (ref 5–15)
BUN: 17 mg/dL (ref 6–23)
CALCIUM: 9.6 mg/dL (ref 8.4–10.5)
CHLORIDE: 105 meq/L (ref 96–112)
CO2: 26 mmol/L (ref 19–32)
CREATININE: 0.58 mg/dL (ref 0.50–1.10)
GFR, EST NON AFRICAN AMERICAN: 83 mL/min — AB (ref >90)
GLUCOSE: 174 mg/dL — AB (ref 70–99)
POTASSIUM: 3.7 mmol/L (ref 3.5–5.1)
Sodium: 140 mmol/L (ref 135–145)

## 2014-05-24 LAB — CBC
HCT: 35.7 % — ABNORMAL LOW (ref 36.0–46.0)
Hemoglobin: 11.6 g/dL — ABNORMAL LOW (ref 12.0–15.0)
MCH: 30.7 pg (ref 26.0–34.0)
MCHC: 32.5 g/dL (ref 30.0–36.0)
MCV: 94.4 fL (ref 78.0–100.0)
Platelets: 293 10*3/uL (ref 150–400)
RBC: 3.78 MIL/uL — ABNORMAL LOW (ref 3.87–5.11)
RDW: 14.1 % (ref 11.5–15.5)
WBC: 9.1 10*3/uL (ref 4.0–10.5)

## 2014-05-24 MED ORDER — FUROSEMIDE 10 MG/ML IJ SOLN
40.0000 mg | Freq: Once | INTRAMUSCULAR | Status: AC
  Administered 2014-05-24: 40 mg via INTRAVENOUS
  Filled 2014-05-24: qty 4

## 2014-05-24 MED ORDER — METOPROLOL SUCCINATE ER 25 MG PO TB24
25.0000 mg | ORAL_TABLET | Freq: Once | ORAL | Status: AC
  Administered 2014-05-24: 25 mg via ORAL
  Filled 2014-05-24: qty 1

## 2014-05-24 MED ORDER — METOPROLOL SUCCINATE ER 50 MG PO TB24
50.0000 mg | ORAL_TABLET | Freq: Every day | ORAL | Status: DC
  Administered 2014-05-25: 50 mg via ORAL
  Filled 2014-05-24 (×2): qty 1

## 2014-05-24 MED ORDER — DILTIAZEM HCL 25 MG/5ML IV SOLN
5.0000 mg | Freq: Once | INTRAVENOUS | Status: AC
  Administered 2014-05-24: 5 mg via INTRAVENOUS
  Filled 2014-05-24: qty 5

## 2014-05-24 MED ORDER — ALBUTEROL SULFATE (2.5 MG/3ML) 0.083% IN NEBU
2.5000 mg | INHALATION_SOLUTION | Freq: Two times a day (BID) | RESPIRATORY_TRACT | Status: DC
Start: 2014-05-24 — End: 2014-05-25
  Administered 2014-05-24: 2.5 mg via RESPIRATORY_TRACT
  Filled 2014-05-24 (×2): qty 3

## 2014-05-24 NOTE — Progress Notes (Signed)
Pts HR elevated in the 120s. On call provider notified, Cardizem 5mg  IV ordered. Will continue to monitor.  Filed Vitals:   05/24/14 0044  BP: 121/70  Pulse:   Temp:   Resp:

## 2014-05-24 NOTE — Progress Notes (Signed)
[facility] PROGRESS NOTE  [name] HUT:[id number] DOB: 08/22/1928 DOA: 05/19/2014 PCP: Wyatt Haste, MD  Assessment/Plan: 1-PNA; received ceftriaxone and azithromycin day 3.  Levaquin 12-31 total day antibiotics 5.  Repeat chest x ray PNA stable.   2-Asthma; continue with nebulizer treatments. Change  Solumedrol to prednisone.   3-A fib started on eliquis.  HR increased  the morning 1-01. Discussed with dr Radford Pax. Cardizem to 180. Monitor overnight.  HR decrease once yesterday at 40 after increase dose of Cardizem.  HR still elevated but patient just came from bathroom she said.  Await cardiology recommendation>  Continue with metoprolol and Cardizem.   4-Acute hypoxic Respiratory Failure;  In setting of PNA, and Asthma, chronic bronchitis.  schedule nebulizer, taper solumedrol.   Code Status: full code.  Family Communication: Care discussed with daughter  Disposition Plan: home when ok by cardio   Consultants:  cardiology  Procedures:  none  Antibiotics:  Ceftriaxone 12-28  Azithromycin 12-29  HPI/Subjective: She is feeling better. Wants to go home.  Just came from bathroom 5 minutes ago.    Objective: Filed Vitals:   05/24/14 0932  BP: 146/82  Pulse:   Temp:   Resp:     Intake/Output Summary (Last 24 hours) at 05/24/14 1039 Last data filed at 05/24/14 0900  Gross per 24 hour  Intake   1300 ml  Output   2450 ml  Net  -1150 ml   Filed Weights   05/22/14 1226 05/23/14 0619 05/24/14 0536  Weight: 67 kg (147 lb 11.3 oz) 67.5 kg (148 lb 13 oz) 64.6 kg (142 lb 6.7 oz)    Exam:   General:  Alert in no distress.   Cardiovascular: S 1, S 2 IRR  Respiratory: CTA  Abdomen: BS present , soft , no tender.   Musculoskeletal: no edema.   Data Reviewed: Basic Metabolic Panel:  Recent Labs Lab 05/19/14 1103 05/19/14 1937 05/22/14 0515  NA 140  --  139  K 3.9  --  4.1  CL 108  --  106  CO2 23  --  23  GLUCOSE 112*  --   175*  BUN 9  --  14  CREATININE 0.40* 0.49* 0.49*  CALCIUM 9.0  --  9.4   Liver Function Tests: No results for input(s): AST, ALT, ALKPHOS, BILITOT, PROT, ALBUMIN in the last 168 hours. No results for input(s): LIPASE, AMYLASE in the last 168 hours. No results for input(s): AMMONIA in the last 168 hours. CBC:  Recent Labs Lab 05/19/14 1103 05/19/14 1937 05/20/14 0438 05/22/14 0515  WBC 8.1 6.3 7.7 4.9  NEUTROABS 6.4  --   --   --   HGB 10.8* 11.1* 10.6* 10.2*  HCT 34.1* 35.2* 34.0* 32.2*  MCV 94.7 94.1 94.4 93.1  PLT 188 205 199 188   Cardiac Enzymes: No results for input(s): CKTOTAL, CKMB, CKMBINDEX, TROPONINI in the last 168 hours. BNP (last 3 results) No results for input(s): PROBNP in the last 8760 hours. CBG: No results for input(s): GLUCAP in the last 168 hours.  Recent Results (from the past 240 hour(s))  Blood Culture (routine x 2)     Status: None (Preliminary result)   Collection Time: 05/19/14  1:06 PM  Result Value Ref Range Status   Specimen Description BLOOD RIGHT FOREARM  Final   Special Requests BOTTLES DRAWN AEROBIC ONLY [place]  Final   Culture   Final           BLOOD CULTURE RECEIVED  NO GROWTH TO DATE CULTURE WILL BE HELD FOR 5 DAYS BEFORE ISSUING A FINAL NEGATIVE REPORT Performed at [facility]    Report Status PENDING  Incomplete  Blood Culture (routine x 2)     Status: None (Preliminary result)   Collection Time: 05/19/14  1:25 PM  Result Value Ref Range Status   Specimen Description BLOOD RIGHT FOREARM  Final   Special Requests BOTTLES DRAWN AEROBIC AND ANAEROBIC 5CC  Final   Culture   Final           BLOOD CULTURE RECEIVED NO GROWTH TO DATE CULTURE WILL BE HELD FOR 5 DAYS BEFORE ISSUING A FINAL NEGATIVE REPORT Performed at [facility]    Report Status PENDING  Incomplete  Urine culture     Status: None   Collection Time: 05/19/14  4:01 PM  Result Value Ref Range Status   Specimen Description URINE, CATHETERIZED  Final    Special Requests NONE  Final   Colony Count NO GROWTH Performed at [facility]   Final   Culture NO GROWTH Performed at [facility]   Final   Report Status 05/20/2014 FINAL  Final     Studies: No results found.  Scheduled Meds: . albuterol  2.5 mg Nebulization TID  . antiseptic oral rinse  7 mL Mouth Rinse BID  . apixaban  5 mg Oral BID  . calcium-vitamin D  1 tablet Oral Q breakfast  . cholecalciferol  1,000 Units Oral Daily  . diltiazem  180 mg Oral Daily  . fluticasone  2 puff Inhalation BID  . guaiFENesin  600 mg Oral BID  . isosorbide dinitrate  30 mg Oral BID  . levofloxacin  500 mg Oral Daily  . metoprolol succinate  25 mg Oral Daily  . pantoprazole  40 mg Oral Daily  . predniSONE  50 mg Oral Q breakfast  . rosuvastatin  40 mg Oral q1800  . sodium chloride  3 mL Intravenous Q12H  . sodium chloride  3 mL Intravenous Q12H   Continuous Infusions:    Active Problems:   Asthma   CAP (community acquired pneumonia)   Aortic stenosis, moderate   Community acquired pneumonia   Atrial fibrillation, unspecified    Time spent: 25 minutes.     Niel Hummer A  [facility] Pager [phone] . If 7PM-7AM, please contact night-coverage at [email or web address], password [facility] 05/24/2014, 10:39 AM  LOS: 5 days

## 2014-05-24 NOTE — Progress Notes (Addendum)
SUBJECTIVE: Pt eager to go home. Daughter present. Becomes dyspneic and fatigued with exertion (walking to bathroom). Denies chest pain.     Intake/Output Summary (Last 24 hours) at 05/24/14 1136 Last data filed at 05/24/14 0900  Gross per 24 hour  Intake   1240 ml  Output   2450 ml  Net  -1210 ml    Current Facility-Administered Medications  Medication Dose Route Frequency Provider Last Rate Last Dose  . 0.9 %  sodium chloride infusion  250 mL Intravenous PRN Charlynne Cousins, MD      . acetaminophen (TYLENOL) tablet 650 mg  650 mg Oral Q6H PRN Charlynne Cousins, MD       Or  . acetaminophen (TYLENOL) suppository 650 mg  650 mg Rectal Q6H PRN Charlynne Cousins, MD      . albuterol (PROVENTIL) (2.5 MG/3ML) 0.083% nebulizer solution 2.5 mg  2.5 mg Nebulization Q2H PRN Belkys A Regalado, MD      . albuterol (PROVENTIL) (2.5 MG/3ML) 0.083% nebulizer solution 2.5 mg  2.5 mg Nebulization TID Belkys A Regalado, MD   2.5 mg at 05/24/14 0808  . antiseptic oral rinse (CPC / CETYLPYRIDINIUM CHLORIDE 0.05%) solution 7 mL  7 mL Mouth Rinse BID Charlynne Cousins, MD   7 mL at 05/24/14 0936  . apixaban (ELIQUIS) tablet 5 mg  5 mg Oral BID Candee Furbish, MD   5 mg at 05/24/14 0934  . calcium-vitamin D (OSCAL WITH D) 500-200 MG-UNIT per tablet 1 tablet  1 tablet Oral Q breakfast Charlynne Cousins, MD   1 tablet at 05/24/14 0747  . cholecalciferol (VITAMIN D) tablet 1,000 Units  1,000 Units Oral Daily Charlynne Cousins, MD   1,000 Units at 05/24/14 [phone]  . diazepam (VALIUM) tablet 2 mg  2 mg Oral Q6H PRN Charlynne Cousins, MD      . diltiazem [facility] CD) 24 hr capsule 180 mg  180 mg Oral Daily Belkys A Regalado, MD   180 mg at 05/24/14 0934  . fluticasone (FLOVENT HFA) 110 MCG/ACT inhaler 2 puff  2 puff Inhalation BID Charlynne Cousins, MD   2 puff at 05/24/14 [phone]  . guaiFENesin (MUCINEX) 12 hr tablet 600 mg  600 mg Oral BID Charlynne Cousins, MD   600 mg at 05/24/14 0934    . guaifenesin (ROBITUSSIN) 100 MG/5ML syrup 200 mg  200 mg Oral Q8H PRN Charlynne Cousins, MD      . isosorbide dinitrate (ISORDIL) tablet 30 mg  30 mg Oral BID Belkys A Regalado, MD   30 mg at 05/24/14 0934  . levofloxacin (LEVAQUIN) tablet 500 mg  500 mg Oral Daily Belkys A Regalado, MD   500 mg at 05/24/14 0934  . metoprolol succinate (TOPROL-XL) 24 hr tablet 25 mg  25 mg Oral Daily Charlynne Cousins, MD   25 mg at 05/24/14 0934  . nitroGLYCERIN (NITROSTAT) SL tablet 0.4 mg  0.4 mg Sublingual Q5 min PRN Charlynne Cousins, MD      . ondansetron [facility]) tablet 4 mg  4 mg Oral Q6H PRN Charlynne Cousins, MD       Or  . ondansetron [facility]) injection 4 mg  4 mg Intravenous Q6H PRN Charlynne Cousins, MD      . pantoprazole (PROTONIX) EC tablet 40 mg  40 mg Oral Daily Charlynne Cousins, MD   40 mg at 05/24/14 0933  . polyethylene glycol (MIRALAX / GLYCOLAX)  packet 17 g  17 g Oral Daily PRN Charlynne Cousins, MD   17 g at 05/23/14 1529  . predniSONE (DELTASONE) tablet 50 mg  50 mg Oral Q breakfast Belkys A Regalado, MD   50 mg at 05/24/14 0747  . rosuvastatin (CRESTOR) tablet 40 mg  40 mg Oral q1800 Charlynne Cousins, MD   40 mg at 05/23/14 1733  . sodium chloride 0.9 % injection 3 mL  3 mL Intravenous Q12H Charlynne Cousins, MD   3 mL at 05/22/14 1000  . sodium chloride 0.9 % injection 3 mL  3 mL Intravenous PRN Charlynne Cousins, MD      . sodium chloride 0.9 % injection 3 mL  3 mL Intravenous Q12H Charlynne Cousins, MD   3 mL at 05/23/14 2131  . traMADol (ULTRAM) tablet 100 mg  100 mg Oral TID PRN Charlynne Cousins, MD        Filed Vitals:   05/24/14 0200 05/24/14 0536 05/24/14 0808 05/24/14 0932  BP:  127/76  146/82  Pulse: 110 115    Temp:  99.3 F (37.4 C)    TempSrc:  Oral    Resp:  16    Height:      Weight:  142 lb 6.7 oz (64.6 kg)    SpO2:  91% 93%     PHYSICAL EXAM General: NAD HEENT: Normal. Neck: No JVD, no thyromegaly.  Lungs: Faint crackles at  bases, diminished sounds at bases. CV: Nondisplaced PMI.  Tachycardic, regular rhythm, normal S1/S2, no S3/S4, 3/6 harsh ejection systolic murmur over RUSB.  No pretibial edema.  No carotid bruit.  Normal pedal pulses.  Abdomen: Soft, nontender, no hepatosplenomegaly, no distention.  Neurologic: Alert and oriented x 3.  Psych: Normal affect. Musculoskeletal: Normal range of motion. No gross deformities. Extremities: No clubbing or cyanosis.   TELEMETRY: Reviewed telemetry pt in atrial fibrillation at times as well as sinus tachycardia with PAC's at times.   LABS: Basic Metabolic Panel:  Recent Labs  05/22/14 0515  NA 139  K 4.1  CL 106  CO2 23  GLUCOSE 175*  BUN 14  CREATININE 0.49*  CALCIUM 9.4   Liver Function Tests: No results for input(s): AST, ALT, ALKPHOS, BILITOT, PROT, ALBUMIN in the last 72 hours. No results for input(s): LIPASE, AMYLASE in the last 72 hours. CBC:  Recent Labs  05/22/14 0515 05/24/14 1040  WBC 4.9 9.1  HGB 10.2* 11.6*  HCT 32.2* 35.7*  MCV 93.1 94.4  PLT 188 293   Cardiac Enzymes: No results for input(s): CKTOTAL, CKMB, CKMBINDEX, TROPONINI in the last 72 hours. BNP: Invalid input(s): POCBNP D-Dimer: No results for input(s): DDIMER in the last 72 hours. Hemoglobin A1C: No results for input(s): HGBA1C in the last 72 hours. Fasting Lipid Panel: No results for input(s): CHOL, HDL, LDLCALC, TRIG, CHOLHDL, LDLDIRECT in the last 72 hours. Thyroid Function Tests: No results for input(s): TSH, T4TOTAL, T3FREE, THYROIDAB in the last 72 hours.  Invalid input(s): FREET3 Anemia Panel: No results for input(s): VITAMINB12, FOLATE, FERRITIN, TIBC, IRON, RETICCTPCT in the last 72 hours.  RADIOLOGY: Dg Chest 2 View  05/22/2014   CLINICAL DATA:  Shortness of breath and cough  EXAM: CHEST  2 VIEW  COMPARISON:  May 19, 2014  FINDINGS: There is underlying emphysematous change. There are pleural effusions bilaterally, slightly larger on the right  than on the left with bibasilar patchy atelectasis, stable. There is no new opacity. Heart is upper normal in  size with pulmonary vascularity within normal limits. No adenopathy. There is lower thoracic and lumbar levoscoliosis. There is atherosclerotic change in the aorta. Patient is status post coronary artery bypass grafting.  IMPRESSION: No appreciable change compared to recent prior study. Underlying emphysema. Bilateral effusions with bibasilar atelectatic change. No new opacity. No change in cardiac silhouette. Extensive atherosclerotic calcification.   Electronically Signed   By: Lowella Grip M.D.   On: 05/22/2014 08:02   Dg Chest 2 View  05/19/2014   CLINICAL DATA:  Chronic bronchitis, shortness of breath for 3-4 days, cough.  EXAM: CHEST  2 VIEW  COMPARISON:  07/30/2012.  FINDINGS: Trachea is midline. Heart size within normal limits. Thoracic aorta is calcified. There is right middle lobe consolidation with a small right pleural effusion. Probable mild scarring in the left lung base. Biapical pleural thickening. Scoliosis.  IMPRESSION: 1. Airspace consolidation in the right middle lobe is most likely due to pneumonia, with a small right parapneumonic effusion. Follow-up to clearing is recommended, to exclude underlying malignancy. 2. Favor scarring at the left lung base. Difficult to exclude early/mild pneumonia.   Electronically Signed   By: Lorin Picket M.D.   On: 05/19/2014 12:12      ASSESSMENT AND PLAN: 1. Tachycardia/atrial fibrillation: Rhythm is regular at present. Will obtain 12-lead ECG to confirm. Currently on Toprol-XL 25 mg and Cardizem CD 180 mg. BP stable. Will increase Toprol-XL to 50 mg q am to better control HR. Anticoagulated with Eliquis. 2. Essential HTN: Reasonably controlled for age and in context of moderate aortic stenosis. Toprol-XL is being increased. 3. CAP: Currently on Levaquin. Continue additional breathing treatments with albuterol for bronchitis. 4.  Dyspnea on exertion: Likely multifactorial given pleural effusions, tachycardia, and moderate aortic stenosis. Breath sounds diminished at bases. Will obtain CXR to make certain worsening heart failure has not developed. Will increase Toprol-XL. 5. Valvular heart disease with moderate mitral regurgitation and moderate aortic stenosis: Most recently evaluated by echo in 01/2014. Will obtain CXR to make certain pleural effusions have not increased in size, given tachycardia and dyspnea with exertion. 6. CAD/CABG: Stable. Continue beta blocker, nitrates, and Crestor. No ASA as on Eliquis.   Kate Sable, M.D., F.A.C.C.  ADDENDUM: ECG demonstrates rapid atrial fibrillation. CXR showed moderate right and small left pleural effusions. Will give IV Lasix as HR most certainly exacerbated by respiratory status. Will reevaluate in a.m.

## 2014-05-25 MED ORDER — PREDNISONE 20 MG PO TABS: ORAL_TABLET | ORAL | Status: DC

## 2014-05-25 MED ORDER — METOPROLOL SUCCINATE ER 50 MG PO TB24: 50.0000 mg | ORAL_TABLET | Freq: Every day | ORAL | Status: DC

## 2014-05-25 MED ORDER — GUAIFENESIN ER 600 MG PO TB12: 600.0000 mg | ORAL_TABLET | Freq: Two times a day (BID) | ORAL | Status: DC

## 2014-05-25 MED ORDER — DILTIAZEM HCL ER COATED BEADS 240 MG PO CP24: 240.0000 mg | ORAL_CAPSULE | Freq: Every day | ORAL | Status: DC

## 2014-05-25 MED ORDER — LEVOFLOXACIN 500 MG PO TABS: 500.0000 mg | ORAL_TABLET | Freq: Every day | ORAL | Status: DC

## 2014-05-25 MED ORDER — ALBUTEROL SULFATE (2.5 MG/3ML) 0.083% IN NEBU
2.5000 mg | INHALATION_SOLUTION | Freq: Four times a day (QID) | RESPIRATORY_TRACT | Status: DC | PRN
Start: 2014-05-25 — End: 2014-05-25

## 2014-05-25 NOTE — Progress Notes (Signed)
Pt. Received discharge instructions and prescriptions. Educated pt. On follow-up appointments and importance of tapering off prednisone. Daughter at bedside. Removed IV and tele. Box. No skin issues noted. All questions answered. No further needs noted at this time.

## 2014-05-25 NOTE — Discharge Summary (Signed)
Physician Discharge Summary  [name]:[id number] DOB: 1928-06-20 DOA: 05/19/2014  PCP: Wyatt Haste, MD  Admit date: 05/19/2014 Discharge date: 05/25/2014  Time spent: 35 minutes  Recommendations for Outpatient Follow-up:  1. Follow with cardio for possible cardioversion.   Discharge Diagnoses:    Community acquired pneumonia   Atrial fibrillation, unspecified   Asthma   CAP (community acquired pneumonia)   Aortic stenosis, moderate    Discharge Condition: stable.   Diet recommendation: heart healthy  Filed Weights   05/23/14 0619 05/24/14 0536 05/25/14 0609  Weight: 67.5 kg (148 lb 13 oz) 64.6 kg (142 lb 6.7 oz) 62.46 kg (137 lb 11.2 oz)    History of present illness:  [name] is a 81 y.o. female with past medical history of asthma by PFTs followed by Dr. Elsworth Soho, past medical history of coronary artery sees that comes in for worsening cough and shortness of breath that started 3-4 days prior to admission. She relates her cough is productive and yellow in color and used to be clear. She relates shortness of breath with ambulation. She relates she cannot walk to the door without being short of breath. She denies any chest pain, weakness, dizziness, nausea, or vomiting.  Hospital Course:  1-PNA; received ceftriaxone and azithromycin day 3. Levaquin 12-31 total day antibiotics 6. discharge on 2 more days of antibiotics.  Repeat chest x ray PNA stable.   2-Asthma; continue with nebulizer treatments. Change Solumedrol to prednisone.   3-A fib started on eliquis.  HR increased. Her Cardizem and metoprolol has been increased. Ok to discharge today per Dr Cathie Olden.  Continue with metoprolol and Cardizem.   4-Acute hypoxic Respiratory Failure;  In setting of PNA, and Asthma, chronic bronchitis.  schedule nebulizer, taper solumedrol.   Procedures:  none  Consultations:  cardiology  Discharge Exam: Filed Vitals:   05/25/14 1037  BP: 114/83  Pulse:    Temp:   Resp:     General: Alert in no distress.  Cardiovascular: S 1 S 2 IRR Respiratory: CTA  Discharge Instructions   Discharge Instructions    Diet - low sodium heart healthy    Complete by:  As directed      Increase activity slowly    Complete by:  As directed           Current Discharge Medication List    START taking these medications   Details  apixaban (ELIQUIS) 5 MG TABS tablet Take 1 tablet (5 mg total) by mouth 2 (two) times daily. Qty: 60 tablet, Refills: 0    levofloxacin (LEVAQUIN) 500 MG tablet Take 1 tablet (500 mg total) by mouth daily. Qty: 3 tablet, Refills: 0    predniSONE (DELTASONE) 20 MG tablet Take 3 tablets for 3 days then 2 tablet for 3 days then 1 tablet then stop. Qty: 16 tablet, Refills: 0      CONTINUE these medications which have CHANGED   Details  diltiazem (CARDIZEM CD) 240 MG 24 hr capsule Take 1 capsule (240 mg total) by mouth daily. Qty: 30 capsule, Refills: 0    !! guaiFENesin (MUCINEX) 600 MG 12 hr tablet Take 1 tablet (600 mg total) by mouth 2 (two) times daily. Qty: 30 tablet, Refills: 0    metoprolol succinate (TOPROL-XL) 50 MG 24 hr tablet Take 1 tablet (50 mg total) by mouth daily with breakfast. Take with or immediately following a meal. Qty: 30 tablet, Refills: 0     !! - Potential duplicate medications found.  Please discuss with provider.    CONTINUE these medications which have NOT CHANGED   Details  albuterol (PROAIR HFA) 108 (90 BASE) MCG/ACT inhaler Inhale 2 puffs into the lungs every 4 (four) hours as needed for wheezing. Qty: 1 Inhaler, Refills: 1   Associated Diagnoses: Asthma, chronic, mild intermittent, uncomplicated    calcium-vitamin D (OSCAL WITH D) 500-200 MG-UNIT per tablet Take 1 tablet by mouth daily with breakfast.    cholecalciferol (VITAMIN D) 1000 UNITS tablet Take 1,000 Units by mouth daily.    diazepam (VALIUM) 2 MG tablet Take 2 mg by mouth every 6 (six) hours as needed (dizziness).     fluticasone (FLONASE) 50 MCG/ACT nasal spray instill 2 sprays into each nostril once daily Qty: 16 g, Refills: 11   Associated Diagnoses: Allergic rhinitis, cause unspecified    fluticasone (FLOVENT HFA) 110 MCG/ACT inhaler inhale 1 puff by mouth twice a day Qty: 12 g, Refills: 11   Associated Diagnoses: Allergic rhinitis, cause unspecified    !! guaiFENesin (MUCINEX) 600 MG 12 hr tablet Take 600 mg by mouth 2 (two) times daily.      isosorbide dinitrate (ISORDIL) 30 MG tablet Take 1 tablet (30 mg total) by mouth 2 (two) times daily. Qty: 60 tablet, Refills: 11    loratadine (CLARITIN) 10 MG tablet Take 10 mg by mouth daily.      omeprazole (PRILOSEC) 20 MG capsule take 2 capsules by mouth once daily Qty: 60 capsule, Refills: 11   Associated Diagnoses: GERD (gastroesophageal reflux disease)    rosuvastatin (CRESTOR) 40 MG tablet take 1 tablet by mouth once daily Qty: 30 tablet, Refills: 11   Associated Diagnoses: Hyperlipidemia    traMADol (ULTRAM) 50 MG tablet Take 100 mg by mouth 3 (three) times daily as needed (pain).    nitroGLYCERIN (NITROSTAT) 0.4 MG SL tablet Place 0.4 mg under the tongue every 5 (five) minutes as needed for chest pain (MAX 3 Tablets).     !! - Potential duplicate medications found. Please discuss with provider.    STOP taking these medications     clopidogrel (PLAVIX) 75 MG tablet      guaiFENesin (ROBITUSSIN) 100 MG/5ML liquid      valsartan (DIOVAN) 40 MG tablet        Allergies  Allergen Reactions  . Bactrim [Sulfamethoxazole-Trimethoprim] Other (See Comments)    Makes her very strange   . Amitriptyline Hcl Rash  . Aspirin Swelling and Rash  . Zetia [Ezetimibe] Rash   Follow-up Information    Follow up with Wyatt Haste, MD In 1 week.   Specialty:  Family Medicine   Contact information:   [place] [address] [phone]       Follow up with Lauree Chandler, MD In 2 weeks.   Specialty:   Cardiology   Contact information:   [address] [place] [address] [phone]        The results of significant diagnostics from this hospitalization (including imaging, microbiology, ancillary and laboratory) are listed below for reference.    Significant Diagnostic Studies: Dg Chest 2 View  05/24/2014   CLINICAL DATA:  Pleural effusion.  EXAM: CHEST  2 VIEW  COMPARISON:  05/22/2014  FINDINGS: Moderate right and small left pleural effusions with passive atelectasis. The overall similar to prior.  Prior CABG. Heart size within normal limits. Atherosclerotic aortic arch. Interstitial accentuation is still present but is mildly improved compared to prior.  IMPRESSION: 1. Improved interstitial accentuation.  Otherwise stable.   Electronically Signed   By: Sherryl Barters M.D.   On: 05/24/2014 13:31   Dg Chest 2 View  05/22/2014   CLINICAL DATA:  Shortness of breath and cough  EXAM: CHEST  2 VIEW  COMPARISON:  May 19, 2014  FINDINGS: There is underlying emphysematous change. There are pleural effusions bilaterally, slightly larger on the right than on the left with bibasilar patchy atelectasis, stable. There is no new opacity. Heart is upper normal in size with pulmonary vascularity within normal limits. No adenopathy. There is lower thoracic and lumbar levoscoliosis. There is atherosclerotic change in the aorta. Patient is status post coronary artery bypass grafting.  IMPRESSION: No appreciable change compared to recent prior study. Underlying emphysema. Bilateral effusions with bibasilar atelectatic change. No new opacity. No change in cardiac silhouette. Extensive atherosclerotic calcification.   Electronically Signed   By: Lowella Grip M.D.   On: 05/22/2014 08:02   Dg Chest 2 View  05/19/2014   CLINICAL DATA:  Chronic bronchitis, shortness of breath for 3-4 days, cough.  EXAM: CHEST  2 VIEW  COMPARISON:  07/30/2012.  FINDINGS: Trachea is midline. Heart size within  normal limits. Thoracic aorta is calcified. There is right middle lobe consolidation with a small right pleural effusion. Probable mild scarring in the left lung base. Biapical pleural thickening. Scoliosis.  IMPRESSION: 1. Airspace consolidation in the right middle lobe is most likely due to pneumonia, with a small right parapneumonic effusion. Follow-up to clearing is recommended, to exclude underlying malignancy. 2. Favor scarring at the left lung base. Difficult to exclude early/mild pneumonia.   Electronically Signed   By: Lorin Picket M.D.   On: 05/19/2014 12:12    Microbiology: Recent Results (from the past 240 hour(s))  Blood Culture (routine x 2)     Status: None (Preliminary result)   Collection Time: 05/19/14  1:06 PM  Result Value Ref Range Status   Specimen Description BLOOD RIGHT FOREARM  Final   Special Requests BOTTLES DRAWN AEROBIC ONLY Alexander  Final   Culture   Final           BLOOD CULTURE RECEIVED NO GROWTH TO DATE CULTURE WILL BE HELD FOR 5 DAYS BEFORE ISSUING A FINAL NEGATIVE REPORT Performed at [facility]    Report Status PENDING  Incomplete  Blood Culture (routine x 2)     Status: None (Preliminary result)   Collection Time: 05/19/14  1:25 PM  Result Value Ref Range Status   Specimen Description BLOOD RIGHT FOREARM  Final   Special Requests BOTTLES DRAWN AEROBIC AND ANAEROBIC 5CC  Final   Culture   Final           BLOOD CULTURE RECEIVED NO GROWTH TO DATE CULTURE WILL BE HELD FOR 5 DAYS BEFORE ISSUING A FINAL NEGATIVE REPORT Performed at [facility]    Report Status PENDING  Incomplete  Urine culture     Status: None   Collection Time: 05/19/14  4:01 PM  Result Value Ref Range Status   Specimen Description URINE, CATHETERIZED  Final   Special Requests NONE  Final   Colony Count NO GROWTH Performed at [facility]   Final   Culture NO GROWTH Performed at [facility]   Final   Report Status 05/20/2014 FINAL  Final      Labs: Basic Metabolic Panel:  Recent Labs Lab 05/19/14 1103 05/19/14 1937 05/22/14 0515 05/24/14 1040  NA 140  --  139 140  K 3.9  --  4.1 3.7  CL 108  --  106 105  CO2 23  --  23 26  GLUCOSE 112*  --  175* 174*  BUN 9  --  14 17  CREATININE 0.40* 0.49* 0.49* 0.58  CALCIUM 9.0  --  9.4 9.6   Liver Function Tests: No results for input(s): AST, ALT, ALKPHOS, BILITOT, PROT, ALBUMIN in the last 168 hours. No results for input(s): LIPASE, AMYLASE in the last 168 hours. No results for input(s): AMMONIA in the last 168 hours. CBC:  Recent Labs Lab 05/19/14 1103 05/19/14 1937 05/20/14 0438 05/22/14 0515 05/24/14 1040  WBC 8.1 6.3 7.7 4.9 9.1  NEUTROABS 6.4  --   --   --   --   HGB 10.8* 11.1* 10.6* 10.2* 11.6*  HCT 34.1* 35.2* 34.0* 32.2* 35.7*  MCV 94.7 94.1 94.4 93.1 94.4  PLT 188 205 199 188 293   Cardiac Enzymes: No results for input(s): CKTOTAL, CKMB, CKMBINDEX, TROPONINI in the last 168 hours. BNP: BNP (last 3 results) No results for input(s): PROBNP in the last 8760 hours. CBG: No results for input(s): GLUCAP in the last 168 hours.     SignedNiel Hummer A  [facility] 05/25/2014, 11:55 AM

## 2014-05-25 NOTE — Progress Notes (Signed)
SUBJECTIVE: 81 yo with hx of asthma, CAD admitted with shortness of breath. Found to have atrial fibrillation Is now on Eliquis      Pt eager to go home. Daughter present. Becomes dyspneic and fatigued with exertion (walking to bathroom). Denies chest pain.  She is in atrial fib.  She has had a-fib in the past and tolerated it very well.     Intake/Output Summary (Last 24 hours) at 05/25/14 1052 Last data filed at 05/25/14 1047  Gross per 24 hour  Intake    440 ml  Output   3600 ml  Net  -3160 ml    Current Facility-Administered Medications  Medication Dose Route Frequency Provider Last Rate Last Dose  . 0.9 %  sodium chloride infusion  250 mL Intravenous PRN Charlynne Cousins, MD      . acetaminophen (TYLENOL) tablet 650 mg  650 mg Oral Q6H PRN Charlynne Cousins, MD       Or  . acetaminophen (TYLENOL) suppository 650 mg  650 mg Rectal Q6H PRN Charlynne Cousins, MD      . albuterol (PROVENTIL) (2.5 MG/3ML) 0.083% nebulizer solution 2.5 mg  2.5 mg Nebulization Q6H PRN Belkys A Regalado, MD      . antiseptic oral rinse (CPC / CETYLPYRIDINIUM CHLORIDE 0.05%) solution 7 mL  7 mL Mouth Rinse BID Charlynne Cousins, MD   7 mL at 05/25/14 1043  . apixaban (ELIQUIS) tablet 5 mg  5 mg Oral BID Candee Furbish, MD   5 mg at 05/25/14 1038  . calcium-vitamin D (OSCAL WITH D) 500-200 MG-UNIT per tablet 1 tablet  1 tablet Oral Q breakfast Charlynne Cousins, MD   1 tablet at 05/25/14 1037  . cholecalciferol (VITAMIN D) tablet 1,000 Units  1,000 Units Oral Daily Charlynne Cousins, MD   1,000 Units at 05/25/14 1038  . diazepam (VALIUM) tablet 2 mg  2 mg Oral Q6H PRN Charlynne Cousins, MD      . diltiazem [facility] CD) 24 hr capsule 180 mg  180 mg Oral Daily Belkys A Regalado, MD   180 mg at 05/25/14 1039  . fluticasone (FLOVENT HFA) 110 MCG/ACT inhaler 2 puff  2 puff Inhalation BID Charlynne Cousins, MD   2 puff at 05/25/14 [phone]  . guaiFENesin (MUCINEX) 12 hr tablet 600 mg   600 mg Oral BID Charlynne Cousins, MD   600 mg at 05/25/14 1038  . guaifenesin (ROBITUSSIN) 100 MG/5ML syrup 200 mg  200 mg Oral Q8H PRN Charlynne Cousins, MD      . isosorbide dinitrate (ISORDIL) tablet 30 mg  30 mg Oral BID Belkys A Regalado, MD   30 mg at 05/25/14 1039  . levofloxacin (LEVAQUIN) tablet 500 mg  500 mg Oral Daily Belkys A Regalado, MD   500 mg at 05/25/14 1038  . metoprolol succinate (TOPROL-XL) 24 hr tablet 50 mg  50 mg Oral Q breakfast Herminio Commons, MD   50 mg at 05/25/14 1039  . nitroGLYCERIN (NITROSTAT) SL tablet 0.4 mg  0.4 mg Sublingual Q5 min PRN Charlynne Cousins, MD      . ondansetron [facility]) tablet 4 mg  4 mg Oral Q6H PRN Charlynne Cousins, MD       Or  . ondansetron [facility]) injection 4 mg  4 mg Intravenous Q6H PRN Charlynne Cousins, MD      . pantoprazole (PROTONIX) EC tablet 40 mg  40 mg Oral  Daily Charlynne Cousins, MD   40 mg at 05/25/14 1038  . polyethylene glycol (MIRALAX / GLYCOLAX) packet 17 g  17 g Oral Daily PRN Charlynne Cousins, MD   17 g at 05/23/14 1529  . predniSONE (DELTASONE) tablet 50 mg  50 mg Oral Q breakfast Belkys A Regalado, MD   50 mg at 05/25/14 1037  . rosuvastatin (CRESTOR) tablet 40 mg  40 mg Oral q1800 Charlynne Cousins, MD   40 mg at 05/24/14 1756  . sodium chloride 0.9 % injection 3 mL  3 mL Intravenous Q12H Charlynne Cousins, MD   3 mL at 05/25/14 1042  . sodium chloride 0.9 % injection 3 mL  3 mL Intravenous PRN Charlynne Cousins, MD      . sodium chloride 0.9 % injection 3 mL  3 mL Intravenous Q12H Charlynne Cousins, MD   3 mL at 05/25/14 1042  . traMADol (ULTRAM) tablet 100 mg  100 mg Oral TID PRN Charlynne Cousins, MD        Filed Vitals:   05/24/14 2102 05/25/14 3875 05/25/14 0948 05/25/14 1037  BP: 130/79 137/88  114/83  Pulse: 119 106    Temp: 98.4 F (36.9 C) 98.2 F (36.8 C)    TempSrc: Oral Oral    Resp: 18 18    Height:      Weight:  137 lb 11.2 oz (62.46 kg)    SpO2: 94% 95% 96%      PHYSICAL EXAM General: NAD HEENT: Normal. Neck: No JVD, no thyromegaly.  Lungs: clear  CV: Nondisplaced PMI.  Tachycardic, Irreg. Irreg. , normal S1/S2, no S3/S4, 3/6 harsh ejection systolic murmur over RUSB.  No pretibial edema.  No carotid bruit.  Normal pedal pulses.  Abdomen: Soft, nontender, no hepatosplenomegaly, no distention.  Neurologic: Alert and oriented x 3.  Psych: Normal affect. Musculoskeletal: Normal range of motion. No gross deformities. Extremities: No clubbing or cyanosis.   TELEMETRY: atrial fib   LABS: Basic Metabolic Panel:  Recent Labs  05/24/14 1040  NA 140  K 3.7  CL 105  CO2 26  GLUCOSE 174*  BUN 17  CREATININE 0.58  CALCIUM 9.6   Liver Function Tests: No results for input(s): AST, ALT, ALKPHOS, BILITOT, PROT, ALBUMIN in the last 72 hours. No results for input(s): LIPASE, AMYLASE in the last 72 hours. CBC:  Recent Labs  05/24/14 1040  WBC 9.1  HGB 11.6*  HCT 35.7*  MCV 94.4  PLT 293   Cardiac Enzymes: No results for input(s): CKTOTAL, CKMB, CKMBINDEX, TROPONINI in the last 72 hours. BNP: Invalid input(s): POCBNP D-Dimer: No results for input(s): DDIMER in the last 72 hours. Hemoglobin A1C: No results for input(s): HGBA1C in the last 72 hours. Fasting Lipid Panel: No results for input(s): CHOL, HDL, LDLCALC, TRIG, CHOLHDL, LDLDIRECT in the last 72 hours. Thyroid Function Tests: No results for input(s): TSH, T4TOTAL, T3FREE, THYROIDAB in the last 72 hours.  Invalid input(s): FREET3 Anemia Panel: No results for input(s): VITAMINB12, FOLATE, FERRITIN, TIBC, IRON, RETICCTPCT in the last 72 hours.  RADIOLOGY: Dg Chest 2 View  05/24/2014   CLINICAL DATA:  Pleural effusion.  EXAM: CHEST  2 VIEW  COMPARISON:  05/22/2014  FINDINGS: Moderate right and small left pleural effusions with passive atelectasis. The overall similar to prior.  Prior CABG. Heart size within normal limits. Atherosclerotic aortic arch. Interstitial accentuation  is still present but is mildly improved compared to prior.  IMPRESSION: 1. Improved interstitial accentuation.  Otherwise stable.   Electronically Signed   By: Sherryl Barters M.D.   On: 05/24/2014 13:31   Dg Chest 2 View  05/22/2014   CLINICAL DATA:  Shortness of breath and cough  EXAM: CHEST  2 VIEW  COMPARISON:  May 19, 2014  FINDINGS: There is underlying emphysematous change. There are pleural effusions bilaterally, slightly larger on the right than on the left with bibasilar patchy atelectasis, stable. There is no new opacity. Heart is upper normal in size with pulmonary vascularity within normal limits. No adenopathy. There is lower thoracic and lumbar levoscoliosis. There is atherosclerotic change in the aorta. Patient is status post coronary artery bypass grafting.  IMPRESSION: No appreciable change compared to recent prior study. Underlying emphysema. Bilateral effusions with bibasilar atelectatic change. No new opacity. No change in cardiac silhouette. Extensive atherosclerotic calcification.   Electronically Signed   By: Lowella Grip M.D.   On: 05/22/2014 08:02   Dg Chest 2 View  05/19/2014   CLINICAL DATA:  Chronic bronchitis, shortness of breath for 3-4 days, cough.  EXAM: CHEST  2 VIEW  COMPARISON:  07/30/2012.  FINDINGS: Trachea is midline. Heart size within normal limits. Thoracic aorta is calcified. There is right middle lobe consolidation with a small right pleural effusion. Probable mild scarring in the left lung base. Biapical pleural thickening. Scoliosis.  IMPRESSION: 1. Airspace consolidation in the right middle lobe is most likely due to pneumonia, with a small right parapneumonic effusion. Follow-up to clearing is recommended, to exclude underlying malignancy. 2. Favor scarring at the left lung base. Difficult to exclude early/mild pneumonia.   Electronically Signed   By: Lorin Picket M.D.   On: 05/19/2014 12:12      ASSESSMENT AND PLAN: 1. Tachycardia/atrial  fibrillation:  She is still in atrial fib by exam.   Anticoagulated with Eliquis. She has tolerated atrial fib in the past without much difficulty and I think she can go home today. She will need to see our PA or NP in the office in several weeks to set up outpatient cardioversion. Will increase Diltizem to 240  a day to help with rate control.   She can be discharged today and see PA in the office  2. Essential HTN: Reasonably controlled for age and in context of moderate aortic stenosis. Toprol-XL is being increased. 3. CAP: Currently on Levaquin. Continue additional breathing treatments with albuterol for bronchitis. 4. Dyspnea on exertion: Likely multifactorial given pleural effusions, tachycardia, and moderate aortic stenosis. Breath sounds diminished at bases. Will obtain CXR to make certain worsening heart failure has not developed. Will increase Toprol-XL. 5. Valvular heart disease with moderate mitral regurgitation and moderate aortic stenosis: Most recently evaluated by echo in 01/2014. Will obtain CXR to make certain pleural effusions have not increased in size, given tachycardia and dyspnea with exertion. 6. CAD/CABG: Stable. Continue beta blocker, nitrates, and Crestor. No ASA as on Eliquis.   Thayer Headings, Brooke Bonito., MD, [facility] 05/25/2014, 11:11 AM [address],  [place] Pager [phone]

## 2014-05-26 ENCOUNTER — Telehealth: Payer: Self-pay | Admitting: Cardiovascular Disease

## 2014-05-26 LAB — CULTURE, BLOOD (ROUTINE X 2)
CULTURE: NO GROWTH
Culture: NO GROWTH

## 2014-05-26 NOTE — Telephone Encounter (Signed)
Agree that she should stop taking Eliquis. If she has further bleeding then she should go into the ED for examination and CBC. Can we put her on the schedule to see me or an office APP this week? Thanks, chris

## 2014-05-26 NOTE — Telephone Encounter (Signed)
Patient states she noticed drops of blood on the bathroom floor this am. Has been on Eliquis 5mg  bid for 3 days. Only has a little bit on tissue now. She is adamant about not taking Eliquis any more.

## 2014-05-26 NOTE — Telephone Encounter (Signed)
New message     Pt c/o medication issue: 1. Name of Medication: eliquis 2. How are you currently taking this medication (dosage and times per day)? Twice a day--5mg ----pt started taking rx 3 days ago for the first time 3. Are you having a reaction (difficulty breathing--STAT)?  no 4. What is your medication issue? Pt has blood in urine

## 2014-05-26 NOTE — Telephone Encounter (Signed)
Spoke with patient, and she states she can't possibly come in this week. i asked her if next week was an option and she states she was unsure. Can you work her in somewhere?

## 2014-05-27 NOTE — Telephone Encounter (Signed)
Spoke with pt. She has no transportation this week.  Appt made for pt to see Kerin Ransom, [place] on June 02, 2014 at 2:30

## 2014-06-02 ENCOUNTER — Encounter: Payer: Self-pay | Admitting: Cardiology

## 2014-06-02 ENCOUNTER — Ambulatory Visit (INDEPENDENT_AMBULATORY_CARE_PROVIDER_SITE_OTHER): Payer: Medicare Other | Admitting: Cardiology

## 2014-06-02 VITALS — BP 116/56 | HR 64 | Ht 65.0 in | Wt 148.0 lb

## 2014-06-02 DIAGNOSIS — J189 Pneumonia, unspecified organism: Secondary | ICD-10-CM

## 2014-06-02 DIAGNOSIS — I4819 Other persistent atrial fibrillation: Secondary | ICD-10-CM

## 2014-06-02 DIAGNOSIS — J4540 Moderate persistent asthma, uncomplicated: Secondary | ICD-10-CM

## 2014-06-02 DIAGNOSIS — Z888 Allergy status to other drugs, medicaments and biological substances status: Secondary | ICD-10-CM

## 2014-06-02 DIAGNOSIS — I481 Persistent atrial fibrillation: Secondary | ICD-10-CM | POA: Diagnosis not present

## 2014-06-02 DIAGNOSIS — E785 Hyperlipidemia, unspecified: Secondary | ICD-10-CM

## 2014-06-02 DIAGNOSIS — I350 Nonrheumatic aortic (valve) stenosis: Secondary | ICD-10-CM

## 2014-06-02 DIAGNOSIS — Z951 Presence of aortocoronary bypass graft: Secondary | ICD-10-CM | POA: Diagnosis not present

## 2014-06-02 DIAGNOSIS — Z886 Allergy status to analgesic agent status: Secondary | ICD-10-CM | POA: Insufficient documentation

## 2014-06-02 NOTE — Assessment & Plan Note (Signed)
Recent hospitalization with CAP and new AF with RVR

## 2014-06-02 NOTE — Assessment & Plan Note (Signed)
Resume Plavix 

## 2014-06-02 NOTE — Assessment & Plan Note (Signed)
2006, no angina

## 2014-06-02 NOTE — Assessment & Plan Note (Signed)
Currently rate controlled. She had bleeding with Eliquis and declined this.

## 2014-06-02 NOTE — Assessment & Plan Note (Addendum)
Moderate AS/MR with normal LVF Sept 2015

## 2014-06-02 NOTE — Assessment & Plan Note (Signed)
PFTs in March 2004 have shown significant improvement in FEV1 with bronchodilator, hence maintained on inhaled steroids. She is treated as mild perssitent asthma  Spirometry 5/09 >> shows nml lung function. O2 sats in the office 98

## 2014-06-02 NOTE — Assessment & Plan Note (Signed)
On statin Rx 

## 2014-06-02 NOTE — Patient Instructions (Addendum)
Your physician has recommended you make the following change in your medication:    CONTINUE TO TAKE PLAVIX 75MG    IF HEART RATE CONTINUES TO BELOW 55 CONSISTENTLY CUT METOPROLOL TO 1/2 TABLET     KEEP CURRENT FOLLOW UP AS SCHEDULED

## 2014-06-02 NOTE — Progress Notes (Signed)
06/02/2014 [name]   01/21/1929  [id number]  Primary Physician Wyatt Haste, MD Primary Cardiologist: Dr Trudi Ida  HPI:  81 y/o female followed by our group with CAD s/p CABG x4 with modified Maze 02/04/2005. She has had PAF in the past and at one point she was on Coumadin. She has a history of a rash with ASA and most recently had been on Plavix. She was seen in consult at the hospital 05/19/14 when she presented with multifactorial respiratory failure and was found to be in atrial fibrillation with RVR. Her Toprol and Diltiazem were increased for rate control. Her Plavix was stopped and she was placed on Eliquis with plans for an OP cardioversion. After discharge she developed gross hematuria nad stopped the Eliquis, she is not interested on going back on anticoagulation. In the office today her rate is controlled-65. She is weak but it does not sound like she is having any symptoms from her AF at this time.    Current Outpatient Prescriptions  Medication Sig Dispense Refill  . albuterol (PROAIR HFA) 108 (90 BASE) MCG/ACT inhaler Inhale 2 puffs into the lungs every 4 (four) hours as needed for wheezing. 1 Inhaler 1  . calcium-vitamin D (OSCAL WITH D) 500-200 MG-UNIT per tablet Take 1 tablet by mouth daily with breakfast.    . cholecalciferol (VITAMIN D) 1000 UNITS tablet Take 1,000 Units by mouth daily.    . clopidogrel (PLAVIX) 75 MG tablet Take 75 mg by mouth daily.    . diazepam (VALIUM) 2 MG tablet Take 2 mg by mouth every 6 (six) hours as needed (dizziness).    Marland Kitchen diltiazem (CARDIZEM CD) 240 MG 24 hr capsule Take 1 capsule (240 mg total) by mouth daily. 30 capsule 0  . fluticasone (FLONASE) 50 MCG/ACT nasal spray instill 2 sprays into each nostril once daily 16 g 11  . fluticasone (FLOVENT HFA) 110 MCG/ACT inhaler inhale 1 puff by mouth twice a day 12 g 11  . guaiFENesin (MUCINEX) 600 MG 12 hr tablet Take 1 tablet (600 mg total) by mouth 2 (two) times daily. 30 tablet 0    . isosorbide dinitrate (ISORDIL) 30 MG tablet Take 1 tablet (30 mg total) by mouth 2 (two) times daily. 60 tablet 11  . levofloxacin (LEVAQUIN) 500 MG tablet Take 1 tablet (500 mg total) by mouth daily. 3 tablet 0  . loratadine (CLARITIN) 10 MG tablet Take 10 mg by mouth daily.      . metoprolol succinate (TOPROL-XL) 50 MG 24 hr tablet Take 1 tablet (50 mg total) by mouth daily with breakfast. Take with or immediately following a meal. 30 tablet 0  . nitroGLYCERIN (NITROSTAT) 0.4 MG SL tablet Place 0.4 mg under the tongue every 5 (five) minutes as needed for chest pain (MAX 3 Tablets).    Marland Kitchen omeprazole (PRILOSEC) 20 MG capsule take 2 capsules by mouth once daily 60 capsule 11  . predniSONE (DELTASONE) 20 MG tablet Take 3 tablets for 3 days then 2 tablet for 3 days then 1 tablet then stop. 16 tablet 0  . rosuvastatin (CRESTOR) 40 MG tablet take 1 tablet by mouth once daily 30 tablet 11  . traMADol (ULTRAM) 50 MG tablet Take 100 mg by mouth 3 (three) times daily as needed (pain).    . valsartan (DIOVAN) 40 MG tablet Take 1 tablet by mouth daily.  1   No current facility-administered medications for this visit.    Allergies  Allergen Reactions  . Bactrim [  Sulfamethoxazole-Trimethoprim] Other (See Comments)    Makes her very strange   . Amitriptyline Hcl Rash  . Aspirin Swelling and Rash  . Zetia [Ezetimibe] Rash    History   Social History  . Marital Status: Widowed    Spouse Name: N/A    Number of Children: 3  . Years of Education: N/A   Occupational History  .     Social History Main Topics  . Smoking status: Never Smoker   . Smokeless tobacco: Former Systems developer    Types: Snuff    Quit date: 05/23/2001  . Alcohol Use: No  . Drug Use: No  . Sexual Activity: Not Currently   Other Topics Concern  . Not on file   Social History Narrative     Review of Systems: General: negative for chills, fever, night sweats or weight changes.  Cardiovascular: negative for chest pain,  dyspnea on exertion, edema, orthopnea, palpitations, paroxysmal nocturnal dyspnea or shortness of breath Dermatological: negative for rash Respiratory: negative for cough or wheezing Urologic: negative for hematuria Abdominal: negative for nausea, vomiting, diarrhea, bright red blood per rectum, melena, or hematemesis Neurologic: negative for visual changes, syncope, or dizziness All other systems reviewed and are otherwise negative except as noted above.    Blood pressure 116/56, pulse 64, height 5\' 5"  (1.651 m), weight 148 lb (67.132 kg).  General appearance: alert, cooperative, no distress and presents to the office in a wheelchair Neck: no JVD Lungs: decreased breath sounds, no wheezing Heart: irregularly irregular rhythm and 2/6 honking systolic murmur Extremities: no edema  EKG AF with VR 64  ASSESSMENT AND PLAN:   Atrial fibrillation Currently rate controlled. She had bleeding with Eliquis and declined this.  History of allergy to aspirin Resume Plavix  Community acquired pneumonia Recent hospitalization with CAP and new AF with RVR  S/P CABG (coronary artery bypass graft) 2006, no angina  Aortic stenosis, moderate Moderate AS/MR with normal LVF Sept 2015  Asthma PFTs in March 2004 have shown significant improvement in FEV1 with bronchodilator, hence maintained on inhaled steroids. She is treated as mild perssitent asthma  Spirometry 5/09 >> shows nml lung function. O2 sats in the office 98  Hyperlipidemia On statin Rx   PLAN  I suggested she resume her Plavix. I discussed the risks of atrial fibrillation with the pt and her daughter. She is not interested in going back on anticoagulation. She has a follow up with Dr Julianne Handler in March. If her HR is consistently less than 55 I instructed her daughter to cut her Toprol back to 25 mg daily.   Dennie Moltz KPA-C 06/02/2014 3:13 PM

## 2014-06-10 ENCOUNTER — Ambulatory Visit: Payer: Medicare Other | Admitting: Physician Assistant

## 2014-06-17 ENCOUNTER — Other Ambulatory Visit: Payer: Self-pay | Admitting: Family Medicine

## 2014-06-23 ENCOUNTER — Other Ambulatory Visit: Payer: Self-pay | Admitting: Family Medicine

## 2014-06-26 ENCOUNTER — Encounter: Payer: Self-pay | Admitting: Family Medicine

## 2014-06-26 ENCOUNTER — Other Ambulatory Visit: Payer: Self-pay

## 2014-06-26 ENCOUNTER — Ambulatory Visit (INDEPENDENT_AMBULATORY_CARE_PROVIDER_SITE_OTHER): Payer: Medicare Other | Admitting: Family Medicine

## 2014-06-26 VITALS — BP 122/60 | HR 74 | Wt 138.0 lb

## 2014-06-26 DIAGNOSIS — I4819 Other persistent atrial fibrillation: Secondary | ICD-10-CM

## 2014-06-26 DIAGNOSIS — E785 Hyperlipidemia, unspecified: Secondary | ICD-10-CM | POA: Diagnosis not present

## 2014-06-26 DIAGNOSIS — I481 Persistent atrial fibrillation: Secondary | ICD-10-CM | POA: Diagnosis not present

## 2014-06-26 DIAGNOSIS — I350 Nonrheumatic aortic (valve) stenosis: Secondary | ICD-10-CM | POA: Diagnosis not present

## 2014-06-26 DIAGNOSIS — J4540 Moderate persistent asthma, uncomplicated: Secondary | ICD-10-CM | POA: Diagnosis not present

## 2014-06-26 LAB — LIPID PANEL
CHOLESTEROL: 153 mg/dL (ref 0–200)
HDL: 65 mg/dL (ref >39)
LDL CALC: 62 mg/dL (ref 0–99)
TRIGLYCERIDES: 132 mg/dL (ref <150)
Total CHOL/HDL Ratio: 2.4 Ratio
VLDL: 26 mg/dL (ref 0–40)

## 2014-06-26 MED ORDER — OMEPRAZOLE 20 MG PO CPDR: 40.0000 mg | DELAYED_RELEASE_CAPSULE | Freq: Every day | ORAL | Status: DC

## 2014-06-26 MED ORDER — TRAMADOL HCL 50 MG PO TABS: 100.0000 mg | ORAL_TABLET | Freq: Three times a day (TID) | ORAL | Status: DC | PRN

## 2014-06-26 NOTE — Progress Notes (Signed)
   Subjective:    Patient ID: [name], female    DOB: 10/09/1928, 81 y.o.   MRN: [id number]  HPI She is here for a post hospital follow-up. The hospital record was reviewed. She did have CAP and also developed atrial fibrillation. She was placed on echo list but did have bleeding and at this point is not interested in getting back on another like medication. She presently is on Plavix. He does note shortness of breath with very little physical activity and is concerned of where this is coming from. Her physical activities in general are quite minimal and she spends most of her time sitting and in her wheelchair. She continues on medications listed in the chart and does need several renewed.   Review of Systems     Objective:   Physical Exam Alert and in no distress. Pulse ox sedentary was 96. With minimal physical activity she did drop to 92 but did not go below this. Lungs are clear to auscultation. Cardiac exam does show a systolic murmur.       Assessment & Plan:  Hyperlipidemia - Plan: Lipid panel  Persistent atrial fibrillation  Aortic stenosis, moderate  Asthma, moderate persistent, uncomplicated  she will follow-up with her cardiologist. I explained that she has multiple reasons behind her pulse ox dropping. I explained that she does not qualify for oxygen at this point. She will continue on her present medications. I will renew her tramadol and Prilosec.

## 2014-06-27 MED ORDER — ROSUVASTATIN CALCIUM 40 MG PO TABS: 40.0000 mg | ORAL_TABLET | Freq: Every day | ORAL | Status: DC

## 2014-06-27 MED ORDER — DILTIAZEM HCL ER COATED BEADS 240 MG PO CP24: 240.0000 mg | ORAL_CAPSULE | Freq: Every day | ORAL | Status: DC

## 2014-06-27 MED ORDER — METOPROLOL SUCCINATE ER 50 MG PO TB24: ORAL_TABLET | ORAL | Status: DC

## 2014-06-27 NOTE — Addendum Note (Signed)
Addended by: Denita Lung on: 06/27/2014 10:21 AM   Modules accepted: Orders

## 2014-07-03 ENCOUNTER — Other Ambulatory Visit: Payer: Self-pay

## 2014-07-03 MED ORDER — DILTIAZEM HCL ER COATED BEADS 240 MG PO CP24: 240.0000 mg | ORAL_CAPSULE | Freq: Every day | ORAL | Status: DC

## 2014-07-16 ENCOUNTER — Telehealth: Payer: Self-pay | Admitting: Family Medicine

## 2014-07-16 NOTE — Telephone Encounter (Signed)
Dtr dropped off letter

## 2014-07-16 NOTE — Telephone Encounter (Signed)
Pt says daughter will bring letter from to our office today from [employer] asking if ProAir can be substituted with another med. Pt requested that Dr Redmond School complete this form and let [employer] know that pt can not use anything else because she has tried other meds

## 2014-07-17 ENCOUNTER — Other Ambulatory Visit: Payer: Self-pay | Admitting: Family Medicine

## 2014-07-17 NOTE — Telephone Encounter (Signed)
Is this the right inhaler

## 2014-07-22 MED ORDER — ALBUTEROL SULFATE HFA 108 (90 BASE) MCG/ACT IN AERS
2.0000 | INHALATION_SPRAY | Freq: Four times a day (QID) | RESPIRATORY_TRACT | Status: DC | PRN
Start: 2014-07-22 — End: 2015-04-09

## 2014-07-22 NOTE — Telephone Encounter (Signed)
[employer] denied, covered alternatives are Proventil HFA & Xopenex HFA.  Pt's history shows she has been on Proventil before and I called pt and she said that would be fine.  Please switch pt to Proventil HFA. Thanks

## 2014-07-30 ENCOUNTER — Other Ambulatory Visit: Payer: Self-pay

## 2014-07-30 MED ORDER — ISOSORBIDE DINITRATE 30 MG PO TABS: 30.0000 mg | ORAL_TABLET | Freq: Two times a day (BID) | ORAL | Status: DC

## 2014-07-30 MED ORDER — VALSARTAN 40 MG PO TABS: 40.0000 mg | ORAL_TABLET | Freq: Every day | ORAL | Status: DC

## 2014-08-12 ENCOUNTER — Ambulatory Visit (INDEPENDENT_AMBULATORY_CARE_PROVIDER_SITE_OTHER): Payer: Medicare Other | Admitting: Cardiovascular Disease

## 2014-08-12 ENCOUNTER — Encounter: Payer: Self-pay | Admitting: Cardiovascular Disease

## 2014-08-12 VITALS — BP 130/68 | HR 65 | Ht 65.0 in | Wt 137.8 lb

## 2014-08-12 DIAGNOSIS — I340 Nonrheumatic mitral (valve) insufficiency: Secondary | ICD-10-CM | POA: Diagnosis not present

## 2014-08-12 DIAGNOSIS — I25810 Atherosclerosis of coronary artery bypass graft(s) without angina pectoris: Secondary | ICD-10-CM

## 2014-08-12 DIAGNOSIS — I739 Peripheral vascular disease, unspecified: Secondary | ICD-10-CM

## 2014-08-12 DIAGNOSIS — I779 Disorder of arteries and arterioles, unspecified: Secondary | ICD-10-CM

## 2014-08-12 DIAGNOSIS — I480 Paroxysmal atrial fibrillation: Secondary | ICD-10-CM | POA: Diagnosis not present

## 2014-08-12 DIAGNOSIS — I350 Nonrheumatic aortic (valve) stenosis: Secondary | ICD-10-CM | POA: Diagnosis not present

## 2014-08-12 MED ORDER — CLOPIDOGREL BISULFATE 75 MG PO TABS: 75.0000 mg | ORAL_TABLET | Freq: Every day | ORAL | Status: DC

## 2014-08-12 NOTE — Progress Notes (Signed)
CC: Dyspnea.   History of Present Illness: 81 yo female with history of CAD status post bypass surgery in September 2006 (Dr. Prescott Gum) as well as a Cox-Maze procedure for atrial fibrillation, CVA, carotid artery stenosis s/p R CEA, fibromyalgia, GERD, hiatal hernia, severe kyphoscoloisis, moderate MR, moderately severe AS here today for cardiac follow up. She has been followed in the past by Dr. Haroldine Laws. Myoview 8/10: EF 70% small lateral infarct no ischemia. I saw her in February 2013 and she had c/o occasional chest pains which were atypical. Carotid artery dopplers April 2013 with 40-59% bilateral stenosis with stable RCEA site (she has refused repeat testing).  She was seen in the ED on 11/12/11 and had negative cardiac markers and no EKG changes. Admitted to [facility] 07/30/12 with lateral wall rib pain. Cardiac markers negative. Last echo September 2014 with normal LV function, moderately severe AS with mean gradient 28 mmHg, mild MR. She has seen Dr. Collene Mares with GI and had a swallowing study for "choking sensation". She has seen an ENT as well. Admitted to [facility] December 2015 with pneumonia and atrial fib with RVR. Her Toprol and Diltiazem were increased for rate control. Her Plavix was stopped and she was placed on Eliquis with plans for an OP cardioversion. After discharge she developed gross hematuria and stopped the Eliquis and stated that she was not interested in going back on anticoagulation. She was started back on Plavix.   She is here today for follow up. She is feeling well. She has stable dyspnea with exertion. She is limited by her joint issus. Only has rare chest pains. No syncope. No lower ext edema. She does not wish to start anti-coagulation.   Primary Care Physician: Jill Alexanders  Last Lipid Profile:Lipid Panel     Component Value Date/Time   CHOL 153 06/26/2014 1548   TRIG 132 06/26/2014 1548   HDL 65 06/26/2014 1548   CHOLHDL 2.4 06/26/2014 1548   VLDL 26 06/26/2014 1548   LDLCALC 62 06/26/2014 1548    Past Medical History  Diagnosis Date  . Arthritis   . Asthma   . GERD (gastroesophageal reflux disease)   . Hyperlipidemia   . Myocardial infarction 2003    Stent to CFX  . Aspirin allergy     on Plavix  . Hypertension   . Eczema   . ASHD (arteriosclerotic heart disease)   . CAD (coronary artery disease) 06/2001    CABG '06  . Bronchitis   . Stroke   . Atrial fibrillation 04/2014    declined anticoagulation     Past Surgical History  Procedure Laterality Date  . Abdominal hysterectomy  1976  . Carotid endarterectomy  2010  . Coronary artery bypass graft  01/2005    LIMA-D1; SVG-LAD; SVG-OM; SVG-PDA  . Tonsillectomy    . Cardiac surgery  stents  . Tumor removal      Current Outpatient Prescriptions  Medication Sig Dispense Refill  . albuterol (PROVENTIL HFA;VENTOLIN HFA) 108 (90 BASE) MCG/ACT inhaler Inhale 2 puffs into the lungs every 6 (six) hours as needed for wheezing or shortness of breath. 1 Inhaler 3  . calcium-vitamin D (OSCAL WITH D) 500-200 MG-UNIT per tablet Take 1 tablet by mouth daily with breakfast.    . cholecalciferol (VITAMIN D) 1000 UNITS tablet Take 1,000 Units by mouth daily.    . clopidogrel (PLAVIX) 75 MG tablet Take 1 tablet (75 mg total) by mouth daily. 30 tablet 11  . diazepam (VALIUM) 2 MG  tablet Take 2 mg by mouth every 6 (six) hours as needed (dizziness).    Marland Kitchen diltiazem (CARDIZEM CD) 240 MG 24 hr capsule Take 1 capsule (240 mg total) by mouth daily. 90 capsule 3  . FLOVENT HFA 110 MCG/ACT inhaler inhale 1 puff by mouth twice a day 12 g 11  . fluticasone (FLONASE) 50 MCG/ACT nasal spray instill 2 sprays into each nostril once daily 16 g 11  . guaiFENesin (MUCINEX) 600 MG 12 hr tablet Take 1 tablet (600 mg total) by mouth 2 (two) times daily. 30 tablet 0  . isosorbide dinitrate (ISORDIL) 30 MG tablet Take 1 tablet (30 mg total) by mouth 2 (two) times daily. 60 tablet 1  . loratadine (CLARITIN) 10 MG tablet Take 10  mg by mouth daily.      . metoprolol succinate (TOPROL-XL) 50 MG 24 hr tablet take 1 tablet by mouth once daily WITH BREAKFAST TAKE WITH OR IMMEDIATELY FOLLOWING A MEAL 90 tablet 3  . nitroGLYCERIN (NITROSTAT) 0.4 MG SL tablet Place 0.4 mg under the tongue every 5 (five) minutes as needed for chest pain (MAX 3 Tablets).    Marland Kitchen omeprazole (PRILOSEC) 20 MG capsule Take 2 capsules (40 mg total) by mouth daily. 90 capsule 3  . rosuvastatin (CRESTOR) 40 MG tablet Take 1 tablet (40 mg total) by mouth daily. 90 tablet 3  . traMADol (ULTRAM) 50 MG tablet Take 2 tablets (100 mg total) by mouth 3 (three) times daily as needed (pain). 60 tablet 5  . valsartan (DIOVAN) 40 MG tablet Take 1 tablet (40 mg total) by mouth daily. 30 tablet 1   No current facility-administered medications for this visit.    Allergies  Allergen Reactions  . Bactrim [Sulfamethoxazole-Trimethoprim] Other (See Comments)    Makes her very strange   . Amitriptyline Hcl Rash  . Aspirin Swelling and Rash  . Zetia [Ezetimibe] Rash    History   Social History  . Marital Status: Widowed    Spouse Name: N/A  . Number of Children: 3  . Years of Education: N/A   Occupational History  .     Social History Main Topics  . Smoking status: Never Smoker   . Smokeless tobacco: Former Systems developer    Types: Snuff    Quit date: 05/23/2001  . Alcohol Use: No  . Drug Use: No  . Sexual Activity: Not Currently   Other Topics Concern  . Not on file   Social History Narrative    Family History  Problem Relation Age of Onset  . Heart failure Mother   . Other Father     Review of Systems:  As stated in the HPI and otherwise negative.   BP 130/68 mmHg  Pulse 65  Ht 5\' 5"  (1.651 m)  Wt 137 lb 12.8 oz (62.506 kg)  BMI 22.93 kg/m2  Physical Examination: General: Well developed, well nourished, NAD HEENT: OP clear, mucus membranes moist SKIN: warm, dry. No rashes. Neuro: No focal deficits Musculoskeletal: Muscle strength 5/5 all  ext Psychiatric: Mood and affect normal Neck: No JVD, no carotid bruits, no thyromegaly, no lymphadenopathy. Lungs:Clear bilaterally, no wheezes, rhonci, crackles Cardiovascular: Regular rate and rhythm. Loud systolic murmur. No gallops or rubs. Abdomen:Soft. Bowel sounds present. Non-tender.  Extremities: No lower extremity edema. Pulses are 2 + in the bilateral DP/PT.  Echo 01/24/14:  Left ventricle: The cavity size was normal. Wall thickness was increased in a pattern of mild LVH. There was mild concentric hypertrophy. Systolic function was  normal. The estimated ejection fraction was in the range of 55% to 65%. Wall motion was normal; there were no regional wall motion abnormalities. Doppler parameters are consistent with a reversible restrictive pattern, indicative of decreased left ventricular diastolic compliance and/or increased left atrial pressure (grade 3 diastolic dysfunction). - Aortic valve: Valve mobility was restricted. There was moderate stenosis. Peak velocity (S): 354 cm/s. (prior echo 347 cm/s). Mean gradient (S): 28 mm Hg. - Mitral valve: Calcified annulus. Mildly thickened, moderately calcified leaflets . There was moderate regurgitation.  Assessment and Plan:   1. Carotid artery disease: Stable moderate bilateral disease with patent right CEA site and 40-59% bilateral ICA stenosis by dopplers October 2015.   2. Mitral regurgitation: Moderate by echo September 2015. Will folllow.   3. CAD: She is s/p bypass and doing well. She has chronic, stable angina. I have instructed her to let me know if her chest pain changes in severity or frequency. Continue current meds. She is intolerant to ASA but takes clopidogrel daily.   4. AORTIC STENOSIS: Moderately severe by echo September 2015 with mean gradient of 28 mmHg. At this time, she does not meet criteria for consideration for AVR. She would not be a candidate for open surgical replacement. She could be considered for  TAVR in the future. Repeat echo September 2016  5. Hyperlipidemia: She is on a statin. Lipids well controlled.   6. Atrial fibrillation: Sinus today. She does not wish to consider anti-coagulation due to prior bleeding. Will continue beta blocker and Cardizem.

## 2014-08-12 NOTE — Patient Instructions (Signed)
Your physician wants you to follow-up in:  6 months. You will receive a reminder letter in the mail two months in advance. If you don't receive a letter, please call our office to schedule the follow-up appointment.  Your physician has requested that you have an echocardiogram. Echocardiography is a painless test that uses sound waves to create images of your heart. It provides your doctor with information about the size and shape of your heart and how well your heart's chambers and valves are working. This procedure takes approximately one hour. There are no restrictions for this procedure. To be done in September 2016

## 2014-08-20 ENCOUNTER — Other Ambulatory Visit: Payer: Self-pay | Admitting: Cardiovascular Disease

## 2014-08-20 MED ORDER — VALSARTAN 40 MG PO TABS: 40.0000 mg | ORAL_TABLET | Freq: Every day | ORAL | Status: DC

## 2014-09-08 ENCOUNTER — Telehealth: Payer: Self-pay | Admitting: Family Medicine

## 2014-09-08 NOTE — Telephone Encounter (Signed)
I have no clue what she is talking about so find out what medicine it is

## 2014-09-08 NOTE — Telephone Encounter (Signed)
Pt is talking about prilosec

## 2014-09-08 NOTE — Telephone Encounter (Signed)
Let her try just one a day and see how she tolerates this.

## 2014-09-08 NOTE — Telephone Encounter (Signed)
Pt called and stated that she takes 2 20mg  a day. Her medication was sent in for one 20mg   once a day. She would like to try just taking one a day. Please advise pt. She can be reached at [phone].

## 2014-09-08 NOTE — Telephone Encounter (Signed)
Called and left message on pt cell about trying 1 tablet and seeing how she does

## 2014-09-11 ENCOUNTER — Telehealth: Payer: Self-pay | Admitting: Family Medicine

## 2014-09-11 MED ORDER — OMEPRAZOLE 40 MG PO CPDR
40.0000 mg | DELAYED_RELEASE_CAPSULE | Freq: Every day | ORAL | Status: DC
Start: 2014-09-11 — End: 2014-09-15

## 2014-09-11 NOTE — Telephone Encounter (Signed)
Recv'd fax from [employer] that Omeprazole 40mg  is not covered by insurance but will cover 20mg  2 caps a day.  Can you switch?

## 2014-09-11 NOTE — Telephone Encounter (Signed)
Tell her that I'm calling in the 40 mg strength

## 2014-09-11 NOTE — Telephone Encounter (Signed)
Pt is aware to do 40mg 

## 2014-09-11 NOTE — Telephone Encounter (Signed)
Sent in omeprazole 40mg  to pharmacy

## 2014-09-11 NOTE — Telephone Encounter (Signed)
Go ahead and switched to two of the20 mg tabs

## 2014-09-11 NOTE — Telephone Encounter (Signed)
Pt tried taking 1 of the Omeprazole 20mg  daily and that does not help her so she does need 2 of the Omeprazole to get relief. She only has 5 pills left so need a refill and would like a month refill

## 2014-09-15 MED ORDER — OMEPRAZOLE 20 MG PO CPDR: 40.0000 mg | DELAYED_RELEASE_CAPSULE | Freq: Every day | ORAL | Status: DC

## 2014-09-15 NOTE — Telephone Encounter (Signed)
done

## 2014-10-13 ENCOUNTER — Other Ambulatory Visit: Payer: Self-pay

## 2014-10-13 MED ORDER — ISOSORBIDE DINITRATE 30 MG PO TABS: 30.0000 mg | ORAL_TABLET | Freq: Two times a day (BID) | ORAL | Status: DC

## 2014-10-23 ENCOUNTER — Emergency Department (HOSPITAL_COMMUNITY): Payer: Medicare Other

## 2014-10-23 ENCOUNTER — Observation Stay (HOSPITAL_COMMUNITY): Payer: Medicare Other

## 2014-10-23 ENCOUNTER — Observation Stay (HOSPITAL_COMMUNITY)
Admission: EM | Admit: 2014-10-23 | Discharge: 2014-10-23 | Disposition: A | Discharge status: Home / Self Care | Payer: Medicare Other | Attending: Internal Medicine | Admitting: Internal Medicine

## 2014-10-23 ENCOUNTER — Encounter (HOSPITAL_COMMUNITY): Payer: Self-pay | Admitting: *Deleted

## 2014-10-23 ENCOUNTER — Observation Stay (HOSPITAL_BASED_OUTPATIENT_CLINIC_OR_DEPARTMENT_OTHER): Payer: Medicare Other

## 2014-10-23 DIAGNOSIS — Z951 Presence of aortocoronary bypass graft: Secondary | ICD-10-CM | POA: Diagnosis not present

## 2014-10-23 DIAGNOSIS — K219 Gastro-esophageal reflux disease without esophagitis: Secondary | ICD-10-CM | POA: Insufficient documentation

## 2014-10-23 DIAGNOSIS — I10 Essential (primary) hypertension: Secondary | ICD-10-CM | POA: Diagnosis not present

## 2014-10-23 DIAGNOSIS — R0602 Shortness of breath: Secondary | ICD-10-CM | POA: Diagnosis not present

## 2014-10-23 DIAGNOSIS — Z7951 Long term (current) use of inhaled steroids: Secondary | ICD-10-CM | POA: Diagnosis not present

## 2014-10-23 DIAGNOSIS — M1990 Unspecified osteoarthritis, unspecified site: Secondary | ICD-10-CM | POA: Insufficient documentation

## 2014-10-23 DIAGNOSIS — R600 Localized edema: Secondary | ICD-10-CM | POA: Insufficient documentation

## 2014-10-23 DIAGNOSIS — R011 Cardiac murmur, unspecified: Secondary | ICD-10-CM | POA: Diagnosis not present

## 2014-10-23 DIAGNOSIS — I480 Paroxysmal atrial fibrillation: Secondary | ICD-10-CM | POA: Diagnosis present

## 2014-10-23 DIAGNOSIS — Z79899 Other long term (current) drug therapy: Secondary | ICD-10-CM | POA: Insufficient documentation

## 2014-10-23 DIAGNOSIS — Z8673 Personal history of transient ischemic attack (TIA), and cerebral infarction without residual deficits: Secondary | ICD-10-CM | POA: Diagnosis not present

## 2014-10-23 DIAGNOSIS — I2510 Atherosclerotic heart disease of native coronary artery without angina pectoris: Secondary | ICD-10-CM | POA: Diagnosis not present

## 2014-10-23 DIAGNOSIS — I482 Chronic atrial fibrillation: Secondary | ICD-10-CM

## 2014-10-23 DIAGNOSIS — I340 Nonrheumatic mitral (valve) insufficiency: Secondary | ICD-10-CM | POA: Diagnosis present

## 2014-10-23 DIAGNOSIS — E785 Hyperlipidemia, unspecified: Secondary | ICD-10-CM | POA: Insufficient documentation

## 2014-10-23 DIAGNOSIS — I350 Nonrheumatic aortic (valve) stenosis: Secondary | ICD-10-CM | POA: Diagnosis not present

## 2014-10-23 DIAGNOSIS — I200 Unstable angina: Secondary | ICD-10-CM

## 2014-10-23 DIAGNOSIS — M549 Dorsalgia, unspecified: Secondary | ICD-10-CM | POA: Diagnosis not present

## 2014-10-23 DIAGNOSIS — Z792 Long term (current) use of antibiotics: Secondary | ICD-10-CM | POA: Insufficient documentation

## 2014-10-23 DIAGNOSIS — I252 Old myocardial infarction: Secondary | ICD-10-CM | POA: Diagnosis not present

## 2014-10-23 DIAGNOSIS — R609 Edema, unspecified: Secondary | ICD-10-CM

## 2014-10-23 DIAGNOSIS — I4891 Unspecified atrial fibrillation: Secondary | ICD-10-CM | POA: Diagnosis not present

## 2014-10-23 DIAGNOSIS — J45901 Unspecified asthma with (acute) exacerbation: Secondary | ICD-10-CM | POA: Insufficient documentation

## 2014-10-23 DIAGNOSIS — R079 Chest pain, unspecified: Principal | ICD-10-CM | POA: Insufficient documentation

## 2014-10-23 DIAGNOSIS — Z872 Personal history of diseases of the skin and subcutaneous tissue: Secondary | ICD-10-CM | POA: Insufficient documentation

## 2014-10-23 DIAGNOSIS — R0600 Dyspnea, unspecified: Secondary | ICD-10-CM

## 2014-10-23 DIAGNOSIS — I6523 Occlusion and stenosis of bilateral carotid arteries: Secondary | ICD-10-CM

## 2014-10-23 LAB — CBC
HEMATOCRIT: 36.5 % (ref 36.0–46.0)
Hemoglobin: 11.7 g/dL — ABNORMAL LOW (ref 12.0–15.0)
MCH: 29.7 pg (ref 26.0–34.0)
MCHC: 32.1 g/dL (ref 30.0–36.0)
MCV: 92.6 fL (ref 78.0–100.0)
Platelets: 198 10*3/uL (ref 150–400)
RBC: 3.94 MIL/uL (ref 3.87–5.11)
RDW: 14 % (ref 11.5–15.5)
WBC: 5.7 10*3/uL (ref 4.0–10.5)

## 2014-10-23 LAB — NM MYOCAR MULTI W/SPECT W/WALL MOTION / EF
CSEPHR: 60 %
CSEPPHR: 83 {beats}/min
Estimated workload: 1 METS
Exercise duration (min): 0 min
Exercise duration (sec): 0 s
LV sys vol: 22 mL
LVDIAVOL: 63 mL
NUC STRESS EF: 64 %
Rest HR: 70 {beats}/min

## 2014-10-23 LAB — BASIC METABOLIC PANEL
Anion gap: 10 (ref 5–15)
BUN: 16 mg/dL (ref 6–20)
CALCIUM: 9.6 mg/dL (ref 8.9–10.3)
CO2: 23 mmol/L (ref 22–32)
Chloride: 106 mmol/L (ref 101–111)
Creatinine, Ser: 0.66 mg/dL (ref 0.44–1.00)
GFR calc non Af Amer: >60 mL/min (ref >60)
Glucose, Bld: 118 mg/dL — ABNORMAL HIGH (ref 65–99)
POTASSIUM: 3.6 mmol/L (ref 3.5–5.1)
Sodium: 139 mmol/L (ref 135–145)

## 2014-10-23 LAB — I-STAT TROPONIN, ED: Troponin i, poc: 0.02 ng/mL (ref 0.00–0.08)

## 2014-10-23 LAB — TROPONIN I: Troponin I: <0.03 ng/mL (ref <0.031)

## 2014-10-23 LAB — BRAIN NATRIURETIC PEPTIDE: B NATRIURETIC PEPTIDE 5: 289.2 pg/mL — AB (ref 0.0–100.0)

## 2014-10-23 MED ORDER — ROSUVASTATIN CALCIUM 40 MG PO TABS
40.0000 mg | ORAL_TABLET | Freq: Every day | ORAL | Status: DC
  Administered 2014-10-23: 40 mg via ORAL
  Filled 2014-10-23: qty 2
  Filled 2014-10-23: qty 1

## 2014-10-23 MED ORDER — ISOSORBIDE DINITRATE 30 MG PO TABS
30.0000 mg | ORAL_TABLET | Freq: Two times a day (BID) | ORAL | Status: DC
  Administered 2014-10-23: 30 mg via ORAL
  Filled 2014-10-23 (×3): qty 1

## 2014-10-23 MED ORDER — LORATADINE 10 MG PO TABS
10.0000 mg | ORAL_TABLET | Freq: Every day | ORAL | Status: DC
  Administered 2014-10-23: 10 mg via ORAL
  Filled 2014-10-23: qty 1

## 2014-10-23 MED ORDER — ACETAMINOPHEN 325 MG PO TABS
650.0000 mg | ORAL_TABLET | ORAL | Status: DC | PRN
Start: 2014-10-23 — End: 2014-10-23

## 2014-10-23 MED ORDER — CALCIUM CARBONATE-VITAMIN D 500-200 MG-UNIT PO TABS
1.0000 | ORAL_TABLET | Freq: Every day | ORAL | Status: DC
  Administered 2014-10-23: 1 via ORAL
  Filled 2014-10-23: qty 1

## 2014-10-23 MED ORDER — IRBESARTAN 75 MG PO TABS
37.5000 mg | ORAL_TABLET | Freq: Every day | ORAL | Status: DC
  Administered 2014-10-23: 37.5 mg via ORAL
  Filled 2014-10-23: qty 1

## 2014-10-23 MED ORDER — REGADENOSON 0.4 MG/5ML IV SOLN
INTRAVENOUS | Status: AC
  Filled 2014-10-23: qty 5

## 2014-10-23 MED ORDER — DIAZEPAM 2 MG PO TABS: 2.0000 mg | ORAL_TABLET | Freq: Four times a day (QID) | ORAL | Status: DC | PRN

## 2014-10-23 MED ORDER — TRAMADOL HCL 50 MG PO TABS: 100.0000 mg | ORAL_TABLET | Freq: Three times a day (TID) | ORAL | Status: DC | PRN

## 2014-10-23 MED ORDER — ONDANSETRON HCL 4 MG/2ML IJ SOLN
4.0000 mg | Freq: Four times a day (QID) | INTRAMUSCULAR | Status: DC | PRN
Start: 2014-10-23 — End: 2014-10-23

## 2014-10-23 MED ORDER — PANTOPRAZOLE SODIUM 40 MG PO TBEC
80.0000 mg | DELAYED_RELEASE_TABLET | Freq: Every day | ORAL | Status: DC
  Administered 2014-10-23: 80 mg via ORAL
  Filled 2014-10-23: qty 2

## 2014-10-23 MED ORDER — METOPROLOL SUCCINATE ER 50 MG PO TB24
50.0000 mg | ORAL_TABLET | Freq: Every day | ORAL | Status: DC
  Administered 2014-10-23: 50 mg via ORAL
  Filled 2014-10-23: qty 1

## 2014-10-23 MED ORDER — NITROGLYCERIN 0.4 MG SL SUBL: 0.4000 mg | SUBLINGUAL_TABLET | SUBLINGUAL | Status: DC | PRN

## 2014-10-23 MED ORDER — VITAMIN D 1000 UNITS PO TABS
1000.0000 [IU] | ORAL_TABLET | Freq: Every day | ORAL | Status: DC
  Administered 2014-10-23: 1000 [IU] via ORAL
  Filled 2014-10-23: qty 1

## 2014-10-23 MED ORDER — DILTIAZEM HCL ER COATED BEADS 240 MG PO CP24
240.0000 mg | ORAL_CAPSULE | Freq: Every day | ORAL | Status: DC
  Administered 2014-10-23: 240 mg via ORAL
  Filled 2014-10-23: qty 1

## 2014-10-23 MED ORDER — ALBUTEROL SULFATE (2.5 MG/3ML) 0.083% IN NEBU: 2.5000 mg | INHALATION_SOLUTION | Freq: Four times a day (QID) | RESPIRATORY_TRACT | Status: DC | PRN

## 2014-10-23 MED ORDER — REGADENOSON 0.4 MG/5ML IV SOLN
0.4000 mg | Freq: Once | INTRAVENOUS | Status: AC
  Administered 2014-10-23: 0.4 mg via INTRAVENOUS
  Filled 2014-10-23: qty 5

## 2014-10-23 MED ORDER — FLUTICASONE PROPIONATE 50 MCG/ACT NA SUSP
2.0000 | Freq: Every day | NASAL | Status: DC
  Administered 2014-10-23: 2 via NASAL
  Filled 2014-10-23: qty 16

## 2014-10-23 MED ORDER — ALBUTEROL SULFATE HFA 108 (90 BASE) MCG/ACT IN AERS: 2.0000 | INHALATION_SPRAY | Freq: Four times a day (QID) | RESPIRATORY_TRACT | Status: DC | PRN

## 2014-10-23 MED ORDER — GUAIFENESIN ER 600 MG PO TB12
600.0000 mg | ORAL_TABLET | Freq: Two times a day (BID) | ORAL | Status: DC
  Administered 2014-10-23: 600 mg via ORAL
  Filled 2014-10-23: qty 1

## 2014-10-23 MED ORDER — HEPARIN SODIUM (PORCINE) 5000 UNIT/ML IJ SOLN
5000.0000 [IU] | Freq: Three times a day (TID) | INTRAMUSCULAR | Status: DC
  Administered 2014-10-23 (×2): 5000 [IU] via SUBCUTANEOUS
  Filled 2014-10-23 (×2): qty 1

## 2014-10-23 MED ORDER — BUDESONIDE 0.25 MG/2ML IN SUSP
0.2500 mg | Freq: Two times a day (BID) | RESPIRATORY_TRACT | Status: DC
  Administered 2014-10-23: 0.25 mg via RESPIRATORY_TRACT
  Filled 2014-10-23: qty 2

## 2014-10-23 MED ORDER — CLOPIDOGREL BISULFATE 75 MG PO TABS
75.0000 mg | ORAL_TABLET | Freq: Every day | ORAL | Status: DC
  Administered 2014-10-23: 75 mg via ORAL
  Filled 2014-10-23: qty 1

## 2014-10-23 MED ORDER — FLUTICASONE PROPIONATE HFA 110 MCG/ACT IN AERO: 1.0000 | INHALATION_SPRAY | Freq: Two times a day (BID) | RESPIRATORY_TRACT | Status: DC

## 2014-10-23 MED ORDER — TECHNETIUM TC 99M SESTAMIBI GENERIC - CARDIOLITE
30.0000 | Freq: Once | INTRAVENOUS | Status: AC | PRN
  Administered 2014-10-23: 30 via INTRAVENOUS

## 2014-10-23 MED ORDER — TECHNETIUM TC 99M SESTAMIBI GENERIC - CARDIOLITE
10.0000 | Freq: Once | INTRAVENOUS | Status: AC | PRN
  Administered 2014-10-23: 10 via INTRAVENOUS

## 2014-10-23 NOTE — ED Provider Notes (Signed)
CSN: [id number]     Arrival date & time 10/23/14  0144 History  This chart was scribed for Lori Flemings, MD by Rayfield Citizen, ED Scribe. This patient was seen in room D36C/D36C and the patient's care was started at 2:55 AM.    Chief Complaint  Patient presents with  . Chest Pain   The history is provided by the patient and a relative. No language interpreter was used.     HPI Comments: BLEN RANSOME is a 81 y.o. female with past medical history of MI (2003), HTN, HLD, arteriosclerotic heart disease, CAD (CABG [LIMA-D1; SVG-LAD; SVG-OM; SVG-PDA] 2006), a-fib, stroke, bronchitis who presents to the Emergency Department complaining of three hours of "dull" left-sided chest pain, originating in left shoulder. This pain did not resolve as expected with three NTG tablets; patient reports her previous episodes of chest pain have felt similar, including her prior MI, but all have improved with NTG. She also reports gradually worsening lower extremity swelling and SOB over the last few weeks.   Cardiology care provided by Dr. Shawn Stall.   Past Medical History  Diagnosis Date  . Arthritis   . Asthma   . GERD (gastroesophageal reflux disease)   . Hyperlipidemia   . Myocardial infarction 2003    Stent to CFX  . Aspirin allergy     on Plavix  . Hypertension   . Eczema   . ASHD (arteriosclerotic heart disease)   . CAD (coronary artery disease) 06/2001    CABG '06  . Bronchitis   . Stroke   . Atrial fibrillation 04/2014    declined anticoagulation    Past Surgical History  Procedure Laterality Date  . Abdominal hysterectomy  1976  . Carotid endarterectomy  2010  . Coronary artery bypass graft  01/2005    LIMA-D1; SVG-LAD; SVG-OM; SVG-PDA  . Tonsillectomy    . Cardiac surgery  stents  . Tumor removal     Family History  Problem Relation Age of Onset  . Heart failure Mother   . Other Father    History  Substance Use Topics  . Smoking status: Never Smoker   . Smokeless tobacco: Former  Systems developer    Types: Snuff    Quit date: 05/23/2001  . Alcohol Use: No   OB History    No data available     Review of Systems  Respiratory: Positive for shortness of breath.   Cardiovascular: Positive for chest pain and leg swelling.  Musculoskeletal: Positive for back pain.  All other systems reviewed and are negative.  Allergies  Bactrim; Amitriptyline hcl; Aspirin; and Zetia  Home Medications   Prior to Admission medications   Medication Sig Start Date End Date Taking? Authorizing Provider  albuterol (PROVENTIL HFA;VENTOLIN HFA) 108 (90 BASE) MCG/ACT inhaler Inhale 2 puffs into the lungs every 6 (six) hours as needed for wheezing or shortness of breath. 07/22/14   Denita Lung, MD  calcium-vitamin D (OSCAL WITH D) 500-200 MG-UNIT per tablet Take 1 tablet by mouth daily with breakfast.    Historical Provider, MD  cholecalciferol (VITAMIN D) 1000 UNITS tablet Take 1,000 Units by mouth daily.    Historical Provider, MD  clopidogrel (PLAVIX) 75 MG tablet Take 1 tablet (75 mg total) by mouth daily. 08/12/14   Burnell Blanks, MD  diazepam (VALIUM) 2 MG tablet Take 2 mg by mouth every 6 (six) hours as needed (dizziness). 06/04/13   Denita Lung, MD  diltiazem (CARDIZEM CD) 240 MG 24 hr capsule  Take 1 capsule (240 mg total) by mouth daily. 07/03/14   Burnell Blanks, MD  FLOVENT HFA 110 MCG/ACT inhaler inhale 1 puff by mouth twice a day 07/17/14   Denita Lung, MD  fluticasone [facility]) 50 MCG/ACT nasal spray instill 2 sprays into each nostril once daily 06/23/14   Denita Lung, MD  guaiFENesin (MUCINEX) 600 MG 12 hr tablet Take 1 tablet (600 mg total) by mouth 2 (two) times daily. 05/25/14   Belkys A Regalado, MD  isosorbide dinitrate (ISORDIL) 30 MG tablet Take 1 tablet (30 mg total) by mouth 2 (two) times daily. 10/13/14   Burnell Blanks, MD  loratadine (CLARITIN) 10 MG tablet Take 10 mg by mouth daily.      Historical Provider, MD  metoprolol succinate (TOPROL-XL) 50 MG  24 hr tablet take 1 tablet by mouth once daily WITH BREAKFAST TAKE WITH OR IMMEDIATELY FOLLOWING A MEAL 06/27/14   Denita Lung, MD  nitroGLYCERIN (NITROSTAT) 0.4 MG SL tablet Place 0.4 mg under the tongue every 5 (five) minutes as needed for chest pain (MAX 3 Tablets). 01/21/14   Burnell Blanks, MD  omeprazole (PRILOSEC) 20 MG capsule Take 2 capsules (40 mg total) by mouth daily. 09/15/14   Denita Lung, MD  rosuvastatin (CRESTOR) 40 MG tablet Take 1 tablet (40 mg total) by mouth daily. 06/27/14   Denita Lung, MD  traMADol (ULTRAM) 50 MG tablet Take 2 tablets (100 mg total) by mouth 3 (three) times daily as needed (pain). 06/26/14   Denita Lung, MD  valsartan (DIOVAN) 40 MG tablet Take 1 tablet (40 mg total) by mouth daily. 08/20/14   Burnell Blanks, MD   BP 153/68 mmHg  Pulse 67  Temp(Src) 97.9 F (36.6 C)  Resp 20  SpO2 94% Physical Exam  Constitutional: She is oriented to person, place, and time. She appears well-developed and well-nourished.  HENT:  Head: Normocephalic and atraumatic.  Nose: Nose normal.  Mouth/Throat: Oropharynx is clear and moist.  Eyes: Conjunctivae and EOM are normal. Pupils are equal, round, and reactive to light.  Neck: Normal range of motion. Neck supple. No JVD present. No tracheal deviation present. No thyromegaly present.  Cardiovascular: Normal rate, regular rhythm, normal heart sounds and intact distal pulses.  Exam reveals no gallop and no friction rub.   No murmur heard. Pulmonary/Chest: Effort normal and breath sounds normal. No stridor. No respiratory distress. She has no wheezes. She has no rales. She exhibits no tenderness.  Abdominal: Soft. Bowel sounds are normal. She exhibits no distension and no mass. There is no tenderness. There is no rebound and no guarding.  Musculoskeletal: Normal range of motion. She exhibits edema (1+ pitting edema to knees). She exhibits no tenderness.  Lymphadenopathy:    She has no cervical adenopathy.   Neurological: She is alert and oriented to person, place, and time. She displays normal reflexes. She exhibits normal muscle tone. Coordination normal.  Skin: Skin is warm and dry. No rash noted. No erythema. No pallor.  Psychiatric: She has a normal mood and affect. Her behavior is normal. Judgment and thought content normal.  Nursing note and vitals reviewed.   ED Course  Procedures   DIAGNOSTIC STUDIES: Oxygen Saturation is 94% on RA, low by my interpretation.    COORDINATION OF CARE: 3:01 AM Discussed treatment plan with pt at bedside and pt agreed to plan.   Labs Review Labs Reviewed  CBC - Abnormal; Notable for the following:  Hemoglobin 11.7 (*)    All other components within normal limits  BASIC METABOLIC PANEL - Abnormal; Notable for the following:    Glucose, Bld 118 (*)    All other components within normal limits  BRAIN NATRIURETIC PEPTIDE - Abnormal; Notable for the following:    B Natriuretic Peptide 289.2 (*)    All other components within normal limits  I-STAT TROPOININ, ED    Imaging Review Dg Chest 2 View  10/23/2014   CLINICAL DATA:  Acute onset of left-sided chest pain and back pain. Initial encounter.  EXAM: CHEST  2 VIEW  COMPARISON:  Chest radiograph from 05/24/2014  FINDINGS: The lungs are well-aerated. Peribronchial thickening is noted. Increased interstitial markings raise concern for mild pulmonary edema. A small right pleural effusion is seen. No pneumothorax is identified. Mild scarring is noted at the right lung apex.  The heart is normal in size; the patient is status post median sternotomy, with evidence of prior CABG. No acute osseous abnormalities are seen.  IMPRESSION: Peribronchial thickening noted. Interstitial markings raise concern for mild interstitial edema. Small right pleural effusion noted.   Electronically Signed   By: Garald Balding M.D.   On: 10/23/2014 02:23     EKG Interpretation   Date/Time:  Thursday October 23 2014 01:50:17  EDT Ventricular Rate:  69 PR Interval:  252 QRS Duration: 84 QT Interval:  410 QTC Calculation: 439 R Axis:   76 Text Interpretation:  Sinus rhythm with 1st degree A-V block Otherwise  normal ECG Confirmed by Yoshino Broccoli  MD, Gasper Hopes ([id number]) on 10/23/2014 2:11:21 AM      MDM   Final diagnoses:  Chest pain, unspecified chest pain type  Dyspnea  Peripheral edema    I personally performed the services described in this documentation, which was scribed in my presence. The recorded information has been reviewed and is accurate.  81 year old female with history of coronary disease, valvular disease, history of CABG presents with back pain and chest pain similar to her prior MI.  No ischemia noted on EKG, initial troponin is negative.  Patient also with 2-3 weeks of worsening shortness of breath and peripheral edema.  No history of CHF.  Plan for admission for further workup of chest pain.       Lori Flemings, MD 10/23/14 [phone]

## 2014-10-23 NOTE — Discharge Summary (Signed)
Physician Discharge Summary  [name] IRC:[id number] DOB: 01/11/1929 DOA: 10/23/2014  PCP: Wyatt Haste, MD  Admit date: 10/23/2014 Discharge date: 10/23/2014  Time spent: Less than 30 minutes  Recommendations for Outpatient Follow-up:  1. Dr. Jill Alexanders, PCP in 1 week 2. Dr. Lauree Chandler, Cardiology: MDs office will arrange follow-up appointment.  Discharge Diagnoses:  Principal Problem:   Chest pain Active Problems:   S/P CABG (coronary artery bypass graft)   Mitral regurgitation   Aortic stenosis, moderate   Atrial fibrillation   Pain in the chest   Severe aortic stenosis   Discharge Condition: Improved & Stable  Diet recommendation: Heart healthy diet.  Filed Weights   10/23/14 0502  Weight: 63.1 kg (139 lb 1.8 oz)    History of present illness & Hospital course:  81 year old female patient with history of CAD, status post MI and stent 2003, then bypass surgery in 2006, Cox-Maze procedure for A. fib, CVA, carotid artery stenosis status post right CEA, fibromyalgia, GERD, hiatal hernia, severe kyphoscoliosis, moderate MR, moderately severe AS presented to the ED with chest pain. Chest pain was described as precordial ache radiating to left shoulder and required 3 sublingual NTG rather than the usual 1 tablet. She has chronic exertional dyspnea. She was hospitalized for further evaluation and management.   Chest pain: Telemetry showed sinus rhythm-sinus bradycardia in the 50s but no arrhythmias. Troponin was cycled 3 and negative. EKG without acute changes. Chest pain resolved overnight. Cardiology was consulted and discussed conservative versus invasive initial approach for evaluation of her anginal type of chest pain. In the absence of ongoing angina, low risk cardiac markers, she preferred nuclear perfusion imaging as first approach. Nuclear stress test results are as below and indicates low risk/findings of prior MI. Discussed with Dr. Sallyanne Kuster, Cardiology  who has cleared her for discharge home and will arrange early OP follow-up with her primary cardiologist for consideration for cardiac cath and possible TAVR. Although chest x-ray was reported as pulmonary edema, no overt features of CHF on exam.   Moderate-Severe aortic stenosis: 2-D echo results as below. As stated above, outpatient follow-up with cardiology to consider cardiac cath and possible TAVR  Hyperlipidemia: Continue statins  Paroxysmal A. fib: In sinus rhythm-sinus bradycardia on monitor. Continue beta blockers, Cardizem and Plavix. Patient does not wish to consider anticoagulation due to prior bleeding.  Carotid artery disease: Status post right CEA  Chronic anemia: Stable  CAD status post CABG: Management as above  Mitral regurgitation   Consultations:  Cardiology  Procedures:   2-D echo 10/23/14:  Study Conclusions  - Left ventricle: The cavity size was normal. Wall thickness was normal. Systolic function was normal. The estimated ejection fraction was in the range of 55% to 60%. Wall motion was normal; there were no regional wall motion abnormalities. Features are consistent with a pseudonormal left ventricular filling pattern, with concomitant abnormal relaxation and increased filling pressure (grade 2 diastolic dysfunction). - Aortic valve: There was moderate to severe stenosis. Valve area (VTI): 0.9 cm^2. Valve area (Vmax): 0.8 cm^2. Valve area (Vmean): 0.83 cm^2. Gradients are probably underestimated. - Mitral valve: Calcified annulus. - Tricuspid valve: There was moderate regurgitation.    Discharge Exam:  Complaints:  patient seen this morning and denied any further chest pains. Denied any other complaints. Was anxious to go home. Denies history of chest wall trauma or lifting anything heavy.   Filed Vitals:   10/23/14 1227 10/23/14 1228 10/23/14 1319 10/23/14 1658  BP: 142/55  142/64  110/50  Pulse: 82 81 73 65  Temp:   99.4 F  (37.4 C) 98.1 F (36.7 C)  TempSrc:   Oral Oral  Resp:   18   Height:      Weight:      SpO2:   94% 94%    General exam: moderately built and frail elderly female sitting up comfortably at edge of bed.  Respiratory system: Clear. No increased work of breathing. Mildly reproducible precordial chest tenderness Cardiovascular system: S1 & S2 heard, RRR. No JVD, murmurs, gallops, clicks or pedal edema. telemetry: SR-SB in the 50s.  Gastrointestinal system: Abdomen is nondistended, soft and nontender. Normal bowel sounds heard. Central nervous system: Alert and oriented. No focal neurological deficits. Extremities: Symmetric 5 x 5 power.  Discharge Instructions      Discharge Instructions    Call MD for:  difficulty breathing, headache or visual disturbances    Complete by:  As directed      Call MD for:  extreme fatigue    Complete by:  As directed      Call MD for:  persistant dizziness or light-headedness    Complete by:  As directed      Call MD for:  severe uncontrolled pain    Complete by:  As directed      Diet - low sodium heart healthy    Complete by:  As directed      Increase activity slowly    Complete by:  As directed             Medication List    TAKE these medications        albuterol 108 (90 BASE) MCG/ACT inhaler  Commonly known as:  PROVENTIL HFA;VENTOLIN HFA  Inhale 2 puffs into the lungs every 6 (six) hours as needed for wheezing or shortness of breath.     calcium-vitamin D 500-200 MG-UNIT per tablet  Commonly known as:  OSCAL WITH D  Take 1 tablet by mouth daily with breakfast.     cholecalciferol 1000 UNITS tablet  Commonly known as:  VITAMIN D  Take 1,000 Units by mouth daily.     clopidogrel 75 MG tablet  Commonly known as:  PLAVIX  Take 1 tablet (75 mg total) by mouth daily.     diazepam 2 MG tablet  Commonly known as:  VALIUM  Take 2 mg by mouth every 6 (six) hours as needed (dizziness).     diltiazem 240 MG 24 hr capsule  Commonly  known as:  CARDIZEM CD  Take 1 capsule (240 mg total) by mouth daily.     FLOVENT HFA 110 MCG/ACT inhaler  Generic drug:  fluticasone  inhale 1 puff by mouth twice a day     fluticasone 50 MCG/ACT nasal spray  Commonly known as:  FLONASE  instill 2 sprays into each nostril once daily     guaiFENesin 100 MG/5ML liquid  Commonly known as:  ROBITUSSIN  Take 100 mg by mouth 3 (three) times daily as needed for cough.     guaiFENesin 600 MG 12 hr tablet  Commonly known as:  MUCINEX  Take 1 tablet (600 mg total) by mouth 2 (two) times daily.     isosorbide dinitrate 30 MG tablet  Commonly known as:  ISORDIL  Take 1 tablet (30 mg total) by mouth 2 (two) times daily.     loratadine 10 MG tablet  Commonly known as:  CLARITIN  Take 10 mg by mouth daily.  metoprolol succinate 50 MG 24 hr tablet  Commonly known as:  TOPROL-XL  take 1 tablet by mouth once daily WITH BREAKFAST TAKE WITH OR IMMEDIATELY FOLLOWING A MEAL     nitroGLYCERIN 0.4 MG SL tablet  Commonly known as:  NITROSTAT  Place 0.4 mg under the tongue every 5 (five) minutes as needed for chest pain (MAX 3 Tablets).     omeprazole 20 MG capsule  Commonly known as:  PRILOSEC  Take 2 capsules (40 mg total) by mouth daily.     rosuvastatin 40 MG tablet  Commonly known as:  CRESTOR  Take 1 tablet (40 mg total) by mouth daily.     traMADol 50 MG tablet  Commonly known as:  ULTRAM  Take 2 tablets (100 mg total) by mouth 3 (three) times daily as needed (pain).     valsartan 40 MG tablet  Commonly known as:  DIOVAN  Take 1 tablet (40 mg total) by mouth daily.       Follow-up Information    Follow up with Wyatt Haste, MD. Schedule an appointment as soon as possible for a visit in 1 week.   Specialty:  Family Medicine   Contact information:   [address] [place] [phone]        The results of significant diagnostics from this hospitalization (including imaging, microbiology,  ancillary and laboratory) are listed below for reference.    Significant Diagnostic Studies: Dg Chest 2 View  10/23/2014   CLINICAL DATA:  Acute onset of left-sided chest pain and back pain. Initial encounter.  EXAM: CHEST  2 VIEW  COMPARISON:  Chest radiograph from 05/24/2014  FINDINGS: The lungs are well-aerated. Peribronchial thickening is noted. Increased interstitial markings raise concern for mild pulmonary edema. A small right pleural effusion is seen. No pneumothorax is identified. Mild scarring is noted at the right lung apex.  The heart is normal in size; the patient is status post median sternotomy, with evidence of prior CABG. No acute osseous abnormalities are seen.  IMPRESSION: Peribronchial thickening noted. Interstitial markings raise concern for mild interstitial edema. Small right pleural effusion noted.   Electronically Signed   By: Garald Balding M.D.   On: 10/23/2014 02:23   Nm Myocar Multi W/spect W/wall Motion / Ef  10/23/2014    There was no ST segment deviation noted during stress.  No T wave inversion was noted during stress.  Findings consistent with prior myocardial infarction.  This is a low risk study.  The left ventricular ejection fraction is normal (55-65%).  Defect 1: There is a medium defect of moderate severity present in the  apical lateral location.     Microbiology: No results found for this or any previous visit (from the past 240 hour(s)).   Labs: Basic Metabolic Panel:  Recent Labs Lab 10/23/14 0154  NA 139  K 3.6  CL 106  CO2 23  GLUCOSE 118*  BUN 16  CREATININE 0.66  CALCIUM 9.6   Liver Function Tests: No results for input(s): AST, ALT, ALKPHOS, BILITOT, PROT, ALBUMIN in the last 168 hours. No results for input(s): LIPASE, AMYLASE in the last 168 hours. No results for input(s): AMMONIA in the last 168 hours. CBC:  Recent Labs Lab 10/23/14 0154  WBC 5.7  HGB 11.7*  HCT 36.5  MCV 92.6  PLT 198   Cardiac Enzymes:  Recent  Labs Lab 10/23/14 0154 10/23/14 0603 10/23/14 1435  TROPONINI <0.03 <0.03 <0.03   BNP: BNP (last 3 results)  Recent Labs  05/19/14 1103 10/23/14 0155  BNP 228.9* 289.2*    ProBNP (last 3 results) No results for input(s): PROBNP in the last 8760 hours.  CBG: No results for input(s): GLUCAP in the last 168 hours.     Signed:  Vernell Leep, MD, FACP, FHM. [facility] Pager [phone]  If 7PM-7AM, please contact night-coverage [email or web address] Password [facility] 10/23/2014, 5:04 PM

## 2014-10-23 NOTE — ED Notes (Signed)
Attempted to give report 

## 2014-10-23 NOTE — ED Notes (Signed)
Pt c/o left chest pain radiating to left shoulder x 3 hours.

## 2014-10-23 NOTE — Discharge Instructions (Signed)

## 2014-10-23 NOTE — H&P (Signed)
[facility] History and Physical  DAE ANTONUCCI GHW:[id number] DOB: 01/24/1929 DOA: 10/23/2014  Referring physician: EDP PCP: Wyatt Haste, MD   Chief Complaint: Chest pain   HPI: [name] is a 81 y.o. female with extensive cardiac history including MI in 2003, stent to CFX, CABG in 2006, moderate aortic stenosis followed by cards with yearly echos, mitral regurg.  Patient presents to the ED with c/o chest pain.  Pain is located in her left chest, radiates to left shoulder.  "Dull" and "like prior MI" in quality.  Onset 3 hours ago, not improved with NTG.  She also reports BLE swelling onset gradually over the past 4-5 weeks, and SOB with exertion worsening over the last few weeks.  Review of Systems: Systems reviewed.  As above, otherwise negative  Past Medical History  Diagnosis Date  . Arthritis   . Asthma   . GERD (gastroesophageal reflux disease)   . Hyperlipidemia   . Myocardial infarction 2003    Stent to CFX  . Aspirin allergy     on Plavix  . Hypertension   . Eczema   . ASHD (arteriosclerotic heart disease)   . CAD (coronary artery disease) 06/2001    CABG '06  . Bronchitis   . Stroke   . Atrial fibrillation 04/2014    declined anticoagulation    Past Surgical History  Procedure Laterality Date  . Abdominal hysterectomy  1976  . Carotid endarterectomy  2010  . Coronary artery bypass graft  01/2005    LIMA-D1; SVG-LAD; SVG-OM; SVG-PDA  . Tonsillectomy    . Cardiac surgery  stents  . Tumor removal     Social History:  reports that she has never smoked. She quit smokeless tobacco use about 13 years ago. Her smokeless tobacco use included Snuff. She reports that she does not drink alcohol or use illicit drugs.  Allergies  Allergen Reactions  . Bactrim [Sulfamethoxazole-Trimethoprim] Other (See Comments)    Makes her very strange   . Amitriptyline Hcl Rash  . Aspirin Swelling and Rash  . Zetia [Ezetimibe] Rash    Family History  Problem  Relation Age of Onset  . Heart failure Mother   . Other Father      Prior to Admission medications   Medication Sig Start Date End Date Taking? Authorizing Provider  albuterol (PROVENTIL HFA;VENTOLIN HFA) 108 (90 BASE) MCG/ACT inhaler Inhale 2 puffs into the lungs every 6 (six) hours as needed for wheezing or shortness of breath. 07/22/14  Yes Denita Lung, MD  calcium-vitamin D (OSCAL WITH D) 500-200 MG-UNIT per tablet Take 1 tablet by mouth daily with breakfast.   Yes Historical Provider, MD  cholecalciferol (VITAMIN D) 1000 UNITS tablet Take 1,000 Units by mouth daily.   Yes Historical Provider, MD  clopidogrel (PLAVIX) 75 MG tablet Take 1 tablet (75 mg total) by mouth daily. 08/12/14  Yes Burnell Blanks, MD  diltiazem (CARDIZEM CD) 240 MG 24 hr capsule Take 1 capsule (240 mg total) by mouth daily. 07/03/14  Yes Burnell Blanks, MD  FLOVENT [facility] 110 MCG/ACT inhaler inhale 1 puff by mouth twice a day 07/17/14  Yes Denita Lung, MD  fluticasone [facility]) 50 MCG/ACT nasal spray instill 2 sprays into each nostril once daily 06/23/14  Yes Denita Lung, MD  guaiFENesin (MUCINEX) 600 MG 12 hr tablet Take 1 tablet (600 mg total) by mouth 2 (two) times daily. 05/25/14  Yes Belkys A Regalado, MD  guaiFENesin (ROBITUSSIN) 100 MG/5ML liquid  Take 100 mg by mouth 3 (three) times daily as needed for cough.   Yes Historical Provider, MD  isosorbide dinitrate (ISORDIL) 30 MG tablet Take 1 tablet (30 mg total) by mouth 2 (two) times daily. 10/13/14  Yes Burnell Blanks, MD  loratadine (CLARITIN) 10 MG tablet Take 10 mg by mouth daily.     Yes Historical Provider, MD  metoprolol succinate (TOPROL-XL) 50 MG 24 hr tablet take 1 tablet by mouth once daily WITH BREAKFAST TAKE WITH OR IMMEDIATELY FOLLOWING A MEAL 06/27/14  Yes Denita Lung, MD  nitroGLYCERIN (NITROSTAT) 0.4 MG SL tablet Place 0.4 mg under the tongue every 5 (five) minutes as needed for chest pain (MAX 3 Tablets). 01/21/14  Yes  Burnell Blanks, MD  omeprazole (PRILOSEC) 20 MG capsule Take 2 capsules (40 mg total) by mouth daily. 09/15/14  Yes Denita Lung, MD  rosuvastatin (CRESTOR) 40 MG tablet Take 1 tablet (40 mg total) by mouth daily. 06/27/14  Yes Denita Lung, MD  traMADol (ULTRAM) 50 MG tablet Take 2 tablets (100 mg total) by mouth 3 (three) times daily as needed (pain). 06/26/14  Yes Denita Lung, MD  valsartan (DIOVAN) 40 MG tablet Take 1 tablet (40 mg total) by mouth daily. 08/20/14  Yes Burnell Blanks, MD  diazepam (VALIUM) 2 MG tablet Take 2 mg by mouth every 6 (six) hours as needed (dizziness). 06/04/13   Denita Lung, MD   Physical Exam: Filed Vitals:   10/23/14 0315  BP: 154/74  Pulse: 65  Temp:   Resp: 20    BP 154/74 mmHg  Pulse 65  Temp(Src) 97.9 F (36.6 C)  Resp 20  SpO2 96%  General Appearance:    Alert, oriented, no distress, appears stated age  Head:    Normocephalic, atraumatic  Eyes:    PERRL, EOMI, sclera non-icteric        Nose:   Nares without drainage or epistaxis. Mucosa, turbinates normal  Throat:   Moist mucous membranes. Oropharynx without erythema or exudate.  Neck:   Supple. No carotid bruits.  No thyromegaly.  No lymphadenopathy.   Back:     No CVA tenderness, no spinal tenderness  Lungs:     Clear to auscultation bilaterally, without wheezes, rhonchi or rales  Chest wall:    No tenderness to palpitation  Heart:    Irr, Irr, SEM radiating to carotids  Abdomen:     Soft, non-tender, nondistended, normal bowel sounds, no organomegaly  Genitalia:    deferred  Rectal:    deferred  Extremities:   No clubbing, cyanosis or edema.  Pulses:   2+ and symmetric all extremities  Skin:   Skin color, texture, turgor normal, no rashes or lesions  Lymph nodes:   Cervical, supraclavicular, and axillary nodes normal  Neurologic:   CNII-XII intact. Normal strength, sensation and reflexes      throughout    Labs on Admission:  Basic Metabolic Panel:  Recent  Labs Lab 10/23/14 0154  NA 139  K 3.6  CL 106  CO2 23  GLUCOSE 118*  BUN 16  CREATININE 0.66  CALCIUM 9.6   Liver Function Tests: No results for input(s): AST, ALT, ALKPHOS, BILITOT, PROT, ALBUMIN in the last 168 hours. No results for input(s): LIPASE, AMYLASE in the last 168 hours. No results for input(s): AMMONIA in the last 168 hours. CBC:  Recent Labs Lab 10/23/14 0154  WBC 5.7  HGB 11.7*  HCT 36.5  MCV 92.6  PLT 198   Cardiac Enzymes: No results for input(s): CKTOTAL, CKMB, CKMBINDEX, TROPONINI in the last 168 hours.  BNP (last 3 results) No results for input(s): PROBNP in the last 8760 hours. CBG: No results for input(s): GLUCAP in the last 168 hours.  Radiological Exams on Admission: Dg Chest 2 View  10/23/2014   CLINICAL DATA:  Acute onset of left-sided chest pain and back pain. Initial encounter.  EXAM: CHEST  2 VIEW  COMPARISON:  Chest radiograph from 05/24/2014  FINDINGS: The lungs are well-aerated. Peribronchial thickening is noted. Increased interstitial markings raise concern for mild pulmonary edema. A small right pleural effusion is seen. No pneumothorax is identified. Mild scarring is noted at the right lung apex.  The heart is normal in size; the patient is status post median sternotomy, with evidence of prior CABG. No acute osseous abnormalities are seen.  IMPRESSION: Peribronchial thickening noted. Interstitial markings raise concern for mild interstitial edema. Small right pleural effusion noted.   Electronically Signed   By: Garald Balding M.D.   On: 10/23/2014 02:23    EKG: Independently reviewed.  Assessment/Plan Principal Problem:   Chest pain Active Problems:   S/P CABG (coronary artery bypass graft)   Mitral regurgitation   Aortic stenosis, moderate   Atrial fibrillation   1. Chest pain - given patients history high suspicion of cardiac etiology of some form (new onset CHF due to valvular disease vs ischemic disease). 1. Serial  trops 2. Chest pain obs pathway 3. 2d echo ordered given the peripheral edema and known valvular disease 4. Needs cards eval in AM 2. A.Fib - 1. Continue rate control 2. Not on anticoagulants other than plavix 3. HLD - continue statin    Code Status: Full  Family Communication: Daughter at bedside Disposition Plan: Admit to obs   Time spent: 70 min  Dierks Wach M. [facility] Pager [phone]  If 7AM-7PM, please contact the day team taking care of the patient [email or web address] Password [facility] 10/23/2014, 3:37 AM

## 2014-10-23 NOTE — Progress Notes (Signed)
   [name] presented for a lexiscan cardiolite today.  No immediate complications.  Stress imaging pending.  Murray Hodgkins, NP 10/23/2014, 12:32 PM

## 2014-10-23 NOTE — Progress Notes (Signed)
  Echocardiogram 2D Echocardiogram has been performed.  [name] 10/23/2014, 4:24 PM

## 2014-10-23 NOTE — Consult Note (Signed)
Reason for Consult: Unstable angina  Requesting Physician: Algis Liming  Cardiologist: Angelena Form  HPI: This is a 81 y.o. female with a past medical history significant for CAD s/p MI and stent 2003 and then bypass surgery in September 2006 (Dr. Prescott Gum, LIMA-D1; SVG-LAD; SVG-OM; SVG-PDA) as well as a Cox-Maze procedure for atrial fibrillation, CVA, carotid artery stenosis s/p R CEA, fibromyalgia, GERD, hiatal hernia, severe kyphoscoloisis, moderate MR, moderately severe AS.   She presents today with accelerated angina: her typical discomfort (precordial ache radiating to L shoulder) occurred at rest and required 3 SL NTG rather than the usual one tab. Has minimal residual discomfort now. ECG and 2 sets of enzymes are normal. NSR since admission. She has chronic exertional dyspnea NYHA class 2-3 and reports recent worsening leg swelling (although none seen right now). BNP close to previous value.  Myoview 8/10: EF 70% small lateral infarct no ischemia. Carotid artery dopplers April 2013 with 40-59% bilateral stenosis with stable RCEA site (she has refused repeat testing).Last echo September 2015 with normal LV function, moderately severe AS with mean gradient 28 mmHg, moderate MR. She has seen Dr. Collene Mares with GI and had a swallowing study for "choking sensation". Admitted to [facility] December 2015 with pneumonia and atrial fib with RVR. Her Plavix was stopped and she was placed on Eliquis with plans for an OP cardioversion. After discharge she developed gross hematuria and stopped the Eliquis and stated that she was not interested in going back on anticoagulation. She was started back on Plavix. She is allergic to ASA ("break out like measles").  PMHx:  Past Medical History  Diagnosis Date  . Arthritis   . Asthma   . GERD (gastroesophageal reflux disease)   . Hyperlipidemia   . Myocardial infarction 2003    Stent to CFX  . Aspirin allergy     on Plavix  . Hypertension   . Eczema   .  ASHD (arteriosclerotic heart disease)   . CAD (coronary artery disease) 06/2001    CABG '06  . Bronchitis   . Stroke   . Atrial fibrillation 04/2014    declined anticoagulation    Past Surgical History  Procedure Laterality Date  . Abdominal hysterectomy  1976  . Carotid endarterectomy  2010  . Coronary artery bypass graft  01/2005    LIMA-D1; SVG-LAD; SVG-OM; SVG-PDA  . Tonsillectomy    . Cardiac surgery  stents  . Tumor removal      FAMHx: Family History  Problem Relation Age of Onset  . Heart failure Mother   . Other Father     SOCHx:  reports that she has never smoked. She quit smokeless tobacco use about 13 years ago. Her smokeless tobacco use included Snuff. She reports that she does not drink alcohol or use illicit drugs.  ALLERGIES: Allergies  Allergen Reactions  . Bactrim [Sulfamethoxazole-Trimethoprim] Other (See Comments)    Makes her very strange   . Amitriptyline Hcl Rash  . Aspirin Swelling and Rash  . Zetia [Ezetimibe] Rash    ROS: Constitutional: positive for malaise, negative for chills, fevers and weight loss Eyes: negative Ears, nose, mouth, throat, and face: negative Respiratory: positive for cough, dyspnea on exertion and wheezing, negative for hemoptysis and sputum Cardiovascular: positive for chest pressure/discomfort, dyspnea, irregular heart beat and palpitations, negative for lower extremity edema, orthopnea, paroxysmal nocturnal dyspnea and syncope Gastrointestinal: negative Genitourinary:negative Integument/breast: negative for rash, skin color change and skin lesion(s) Hematologic/lymphatic: negative for bleeding, easy bruising  and petechiae Musculoskeletal:positive for arthralgias, neck pain and stiff joints Neurological: negative Behavioral/Psych: negative Endocrine: negative Allergic/Immunologic: positive for seasonal allergies  HOME MEDICATIONS: Prescriptions prior to admission  Medication Sig Dispense Refill Last Dose  .  albuterol (PROVENTIL HFA;VENTOLIN HFA) 108 (90 BASE) MCG/ACT inhaler Inhale 2 puffs into the lungs every 6 (six) hours as needed for wheezing or shortness of breath. 1 Inhaler 3 10/22/2014 at Unknown time  . calcium-vitamin D (OSCAL WITH D) 500-200 MG-UNIT per tablet Take 1 tablet by mouth daily with breakfast.   10/22/2014 at Unknown time  . cholecalciferol (VITAMIN D) 1000 UNITS tablet Take 1,000 Units by mouth daily.   10/22/2014 at Unknown time  . clopidogrel (PLAVIX) 75 MG tablet Take 1 tablet (75 mg total) by mouth daily. 30 tablet 11 10/22/2014 at Unknown time  . diltiazem (CARDIZEM CD) 240 MG 24 hr capsule Take 1 capsule (240 mg total) by mouth daily. 90 capsule 3 10/22/2014 at Unknown time  . FLOVENT HFA 110 MCG/ACT inhaler inhale 1 puff by mouth twice a day 12 g 11 10/22/2014 at Unknown time  . fluticasone (FLONASE) 50 MCG/ACT nasal spray instill 2 sprays into each nostril once daily 16 g 11 10/22/2014 at Unknown time  . guaiFENesin (MUCINEX) 600 MG 12 hr tablet Take 1 tablet (600 mg total) by mouth 2 (two) times daily. 30 tablet 0 10/22/2014 at Unknown time  . guaiFENesin (ROBITUSSIN) 100 MG/5ML liquid Take 100 mg by mouth 3 (three) times daily as needed for cough.   Past Week at Unknown time  . isosorbide dinitrate (ISORDIL) 30 MG tablet Take 1 tablet (30 mg total) by mouth 2 (two) times daily. 60 tablet 6 10/22/2014 at Unknown time  . loratadine (CLARITIN) 10 MG tablet Take 10 mg by mouth daily.     10/22/2014 at Unknown time  . metoprolol succinate (TOPROL-XL) 50 MG 24 hr tablet take 1 tablet by mouth once daily WITH BREAKFAST TAKE WITH OR IMMEDIATELY FOLLOWING A MEAL 90 tablet 3 10/22/2014 at 0730  . nitroGLYCERIN (NITROSTAT) 0.4 MG SL tablet Place 0.4 mg under the tongue every 5 (five) minutes as needed for chest pain (MAX 3 Tablets).   10/23/2014 at Unknown time  . omeprazole (PRILOSEC) 20 MG capsule Take 2 capsules (40 mg total) by mouth daily. 60 capsule 2 10/22/2014 at Unknown time  . rosuvastatin (CRESTOR)  40 MG tablet Take 1 tablet (40 mg total) by mouth daily. 90 tablet 3 10/22/2014 at Unknown time  . traMADol (ULTRAM) 50 MG tablet Take 2 tablets (100 mg total) by mouth 3 (three) times daily as needed (pain). 60 tablet 5 Past Week at Unknown time  . valsartan (DIOVAN) 40 MG tablet Take 1 tablet (40 mg total) by mouth daily. 90 tablet 1 10/22/2014 at Unknown time  . diazepam (VALIUM) 2 MG tablet Take 2 mg by mouth every 6 (six) hours as needed (dizziness).   unknown    HOSPITAL MEDICATIONS: Scheduled: . budesonide (PULMICORT) nebulizer solution  0.25 mg Nebulization BID  . calcium-vitamin D  1 tablet Oral Q breakfast  . cholecalciferol  1,000 Units Oral Daily  . clopidogrel  75 mg Oral Daily  . diltiazem  240 mg Oral Daily  . fluticasone  2 spray Each Nare Daily  . guaiFENesin  600 mg Oral BID  . heparin  5,000 Units Subcutaneous 3 times per day  . irbesartan  37.5 mg Oral Daily  . isosorbide dinitrate  30 mg Oral BID WC  . loratadine  10 mg Oral Daily  . metoprolol succinate  50 mg Oral Daily  . pantoprazole  80 mg Oral Daily  . rosuvastatin  40 mg Oral Daily    VITALS: Blood pressure 147/61, pulse 63, temperature 97.3 F (36.3 C), temperature source Oral, resp. rate 20, height 5\' 4"  (1.626 m), weight 63.1 kg (139 lb 1.8 oz), SpO2 94 %.  PHYSICAL EXAM:  General: Alert, oriented x3, no distress Head: no evidence of trauma, PERRL, EOMI, no exophtalmos or lid lag, no myxedema, no xanthelasma; normal ears, nose and oropharynx Neck: normal jugular venous pulsations and no hepatojugular reflux; brisk carotid pulses without delay and bilateral carotid bruits Chest: clear to auscultation, no signs of consolidation by percussion or palpation, normal fremitus, symmetrical and full respiratory excursions Cardiovascular: normal position and quality of the apical impulse, regular rhythm, normal first heart sound and distinct second heart sound, no rubs or gallops, mid peaking systolic ejection  aortic murmur Abdomen: no tenderness or distention, no masses by palpation, no abnormal pulsatility or arterial bruits, normal bowel sounds, no hepatosplenomegaly Extremities: no clubbing, cyanosis;  no edema; 2+ radial, ulnar and brachial pulses bilaterally; 2+ right femoral, posterior tibial and dorsalis pedis pulses; 2+ left femoral, posterior tibial and dorsalis pedis pulses; no subclavian or femoral bruits Neurological: grossly nonfocal   LABS  CBC  Recent Labs  10/23/14 0154  WBC 5.7  HGB 11.7*  HCT 36.5  MCV 92.6  PLT 102   Basic Metabolic Panel  Recent Labs  10/23/14 0154  NA 139  K 3.6  CL 106  CO2 23  GLUCOSE 118*  BUN 16  CREATININE 0.66  CALCIUM 9.6   Liver Function Tests No results for input(s): AST, ALT, ALKPHOS, BILITOT, PROT, ALBUMIN in the last 72 hours. No results for input(s): LIPASE, AMYLASE in the last 72 hours. Cardiac Enzymes  Recent Labs  10/23/14 0154 10/23/14 0603  TROPONINI <0.03 <0.03     IMAGING: Dg Chest 2 View  10/23/2014   CLINICAL DATA:  Acute onset of left-sided chest pain and back pain. Initial encounter.  EXAM: CHEST  2 VIEW  COMPARISON:  Chest radiograph from 05/24/2014  FINDINGS: The lungs are well-aerated. Peribronchial thickening is noted. Increased interstitial markings raise concern for mild pulmonary edema. A small right pleural effusion is seen. No pneumothorax is identified. Mild scarring is noted at the right lung apex.  The heart is normal in size; the patient is status post median sternotomy, with evidence of prior CABG. No acute osseous abnormalities are seen.  IMPRESSION: Peribronchial thickening noted. Interstitial markings raise concern for mild interstitial edema. Small right pleural effusion noted.   Electronically Signed   By: Garald Balding M.D.   On: 10/23/2014 02:23    ECG: NSR, 1st degree AVB, no repol changes  TELEMETRY: NSR  IMPRESSION/RECOMMENDATION:  1. CAD s/p CABG with possible unstable angina -  low risk biochemical and ECG markers. Discussed conservative versus invasive initial approach. In the absence of ongoing angina and low risk markers, she prefers nuclear perfusion imaging as first approach. If there is a large reversible defect or another change from previous scan will need coronary and graft angiography.  2. Mitral regurgitation: Moderate by echo September 2015. Echo today  3. Aortic stenosis: Moderately severe by echo September 2015 with mean gradient of 28 mmHg. At this time, she does not meet criteria for consideration for AVR. She would not be a candidate for open surgical replacement. She could be considered for TAVR in the  future. Repeat echo today.  5. Hyperlipidemia: She is on a statin.    6. Atrial fibrillation: Sinus today. She does not wish to consider anti-coagulation due to prior bleeding. Will continue beta blocker and Cardizem. Clopidogrel.  7. Carotid artery disease: Stable moderate bilateral disease with patent right CEA site and 40-59% bilateral ICA stenosis by dopplers October 2015.   Time Spent Directly with Patient: 60 minutes  Sanda Klein, MD, [facility] [phone] office [phone] pager   10/23/2014, 8:48 AM

## 2014-10-24 ENCOUNTER — Telehealth: Payer: Self-pay

## 2014-10-24 ENCOUNTER — Telehealth: Payer: Self-pay | Admitting: *Deleted

## 2014-10-24 NOTE — Telephone Encounter (Signed)
Received message from Dr. Angelena Form that pt needed post hospital follow up in our office. Dr. Angelena Form can see pt on October 30, 2014 at 10:30. I spoke with pt and she cannot be here for this appt due to transportation issues.  I told her I would have our scheduling department contact her to schedule appt.  Pt made aware appt will be with NP or PA.

## 2014-10-24 NOTE — Telephone Encounter (Signed)
1) How is she doing? Patient states she is still having same problems as she went in to hospital   2) Any medication changes? Patient states meds are all the same    3) Make appointment.and make note of hospital follow up continuity of care visit. Patient has to wait till daughter gets home and will call back for an appointment

## 2014-10-27 NOTE — Telephone Encounter (Signed)
Appt has been scheduled for pt to see Cecilie Kicks, NP on November 12, 2014

## 2014-10-30 ENCOUNTER — Ambulatory Visit (INDEPENDENT_AMBULATORY_CARE_PROVIDER_SITE_OTHER): Payer: Medicare Other | Admitting: Family Medicine

## 2014-10-30 ENCOUNTER — Encounter: Payer: Self-pay | Admitting: Family Medicine

## 2014-10-30 VITALS — BP 100/62 | HR 66 | Wt 139.0 lb

## 2014-10-30 DIAGNOSIS — I350 Nonrheumatic aortic (valve) stenosis: Secondary | ICD-10-CM

## 2014-10-30 DIAGNOSIS — J4540 Moderate persistent asthma, uncomplicated: Secondary | ICD-10-CM

## 2014-10-30 DIAGNOSIS — K219 Gastro-esophageal reflux disease without esophagitis: Secondary | ICD-10-CM

## 2014-10-30 DIAGNOSIS — R609 Edema, unspecified: Secondary | ICD-10-CM

## 2014-10-30 NOTE — Progress Notes (Signed)
   Subjective:    Patient ID: [name], female    DOB: 1928-08-29, 81 y.o.   MRN: [id number]  HPI She is here for transition of care management. She was recently hospitalized and evaluated for chest pain. The emergency room and hospital record including discharge summary was reviewed. She responded to medical management. She does have an appointment set up to see her cardiologist. Her medications were reviewed. I want over the discharge instructions with her in detail as well as with her daughter. Other than the cardiologist no other services are required. She did ask multiple questions in regard to other issues including visual symptoms of brightness last less than 10 minutes followed by a very vague sensation again last for short period of time. She also has difficulty with dependent edema with her leg swelling later on in the day. She will occasionally have nausea but last less than 5 minutes. She continues to have difficulty with choking sensation that is secondary to her last carotid surgery.   Review of Systems     Objective:   Physical Exam Alert and in no distress. Cardiac exam shows a regular rhythm with a 2-3/SEM assisted with her aortic stenosis. Lungs are clear to auscultation.       Assessment & Plan:  Aortic stenosis, moderate  Asthma, moderate persistent, uncomplicated  Gastroesophageal reflux disease without esophagitis  Dependent edema I discussed the other symptoms that she is having. No particular intervention concerning the visual changes or the nausea other than possibly using Maalox. Did recommend she keep her feet elevated and use support stockings. She will follow-up with the cardiologist. Over 25 minutes greater than 50%spent in coordinating of care the patient and her daughter

## 2014-10-30 NOTE — Patient Instructions (Signed)
Use support stockings when you get out of bed in the morning and keep your feet elevated

## 2014-11-12 ENCOUNTER — Ambulatory Visit (INDEPENDENT_AMBULATORY_CARE_PROVIDER_SITE_OTHER): Payer: Medicare Other | Admitting: Cardiology

## 2014-11-12 ENCOUNTER — Encounter: Payer: Self-pay | Admitting: Cardiology

## 2014-11-12 VITALS — BP 115/53 | HR 54 | Ht 64.0 in | Wt 140.2 lb

## 2014-11-12 DIAGNOSIS — I480 Paroxysmal atrial fibrillation: Secondary | ICD-10-CM | POA: Diagnosis not present

## 2014-11-12 DIAGNOSIS — I25810 Atherosclerosis of coronary artery bypass graft(s) without angina pectoris: Secondary | ICD-10-CM | POA: Diagnosis not present

## 2014-11-12 DIAGNOSIS — I350 Nonrheumatic aortic (valve) stenosis: Secondary | ICD-10-CM

## 2014-11-12 DIAGNOSIS — Z951 Presence of aortocoronary bypass graft: Secondary | ICD-10-CM | POA: Diagnosis not present

## 2014-11-12 MED ORDER — DILTIAZEM HCL ER COATED BEADS 180 MG PO CP24: 180.0000 mg | ORAL_CAPSULE | Freq: Every day | ORAL | Status: DC

## 2014-11-12 NOTE — Patient Instructions (Addendum)
Medication Instructions:  Your physician has recommended you make the following change in your medication: Decrease Cardizem CD to 180 mg by mouth daily.    Labwork: none  Testing/Procedures: Your physician has requested that you have an echocardiogram. Echocardiography is a painless test that uses sound waves to create images of your heart. It provides your doctor with information about the size and shape of your heart and how well your heart's chambers and valves are working. This procedure takes approximately one hour. There are no restrictions for this procedure. To be done in early September 2016    Follow-Up: Your physician recommends that you schedule a follow-up appointment in: September 2016 with Dr. Azucena Freed for February 20, 2015 at 9:00

## 2014-11-12 NOTE — Progress Notes (Signed)
Cardiology Office Note   Date:  11/13/2014   ID:  [name], DOB 22-Sep-1928, MRN [id number]  PCP:  Wyatt Haste, MD  Cardiologist:  Dr. Angelena Form    Chief Complaint  Patient presents with  . Hospitalization Follow-up    no further chest pain no significant sob      History of Present Illness: [name] is a 81 y.o. female who presents for hospital follow up.  She has a past medical history significant for CAD s/p MI and stent 2003 and then bypass surgery in September 2006 (Dr. Prescott Gum, LIMA-D1; SVG-LAD; SVG-OM; SVG-PDA) as well as a Cox-Maze procedure for atrial fibrillation, CVA, carotid artery stenosis s/p R CEA, fibromyalgia, GERD, hiatal hernia, severe kyphoscoloisis, moderate MR, moderately severe AS.  She has been followed in the past by Dr. Haroldine Laws. Myoview 8/10: EF 70% small lateral infarct no ischemia.  Previous echo September 2014 with normal LV function, moderately severe AS with mean gradient 28 mmHg, mild MR. She has seen Dr. Collene Mares with GI and had a swallowing study for "choking sensation". She has seen an ENT as well. Admitted to [facility] December 2015 with pneumonia and atrial fib with RVR. Her Toprol and Diltiazem were increased for rate control. Her Plavix was stopped and she was placed on Eliquis with plans for an OP cardioversion. After discharge she developed gross hematuria and stopped the Eliquis and stated that she was not interested in going back on anticoagulation. She was started back on Plavix.   She was recently admitted 10/23/2014 for chest pain.  She preferred to have a nuc study to determine her cardiac status.  nuc study was negative for ischemia.  She was noted to be in Sr in hospital and she is here today.  Her Echo 10/23/14 with mod to severe AS EF 55-60% with normal wall motion. G2DD, mod TR   Since discharge she has been doing well no further chest pain but very tired and some nausea.  Her HR today is 54 and BP 115/53 .  She is asking about  aortic valve repair with TAVR, she will see Dr. Angelena Form to discuss.    Past Medical History  Diagnosis Date  . Arthritis   . Asthma   . GERD (gastroesophageal reflux disease)   . Hyperlipidemia   . Myocardial infarction 2003    Stent to CFX  . Aspirin allergy     on Plavix  . Hypertension   . Eczema   . ASHD (arteriosclerotic heart disease)   . CAD (coronary artery disease) 06/2001    CABG '06  . Bronchitis   . Stroke   . Atrial fibrillation 04/2014    declined anticoagulation     Past Surgical History  Procedure Laterality Date  . Abdominal hysterectomy  1976  . Carotid endarterectomy  2010  . Coronary artery bypass graft  01/2005    LIMA-D1; SVG-LAD; SVG-OM; SVG-PDA  . Tonsillectomy    . Cardiac surgery  stents  . Tumor removal       Current Outpatient Prescriptions  Medication Sig Dispense Refill  . albuterol (PROVENTIL HFA;VENTOLIN HFA) 108 (90 BASE) MCG/ACT inhaler Inhale 2 puffs into the lungs every 6 (six) hours as needed for wheezing or shortness of breath. 1 Inhaler 3  . calcium-vitamin D (OSCAL WITH D) 500-200 MG-UNIT per tablet Take 1 tablet by mouth daily with breakfast.    . cholecalciferol (VITAMIN D) 1000 UNITS tablet Take 1,000 Units by mouth daily.    Marland Kitchen  clopidogrel (PLAVIX) 75 MG tablet Take 1 tablet (75 mg total) by mouth daily. 30 tablet 11  . diazepam (VALIUM) 2 MG tablet Take 2 mg by mouth every 6 (six) hours as needed (dizziness).    Marland Kitchen diltiazem (CARDIZEM CD) 180 MG 24 hr capsule Take 1 capsule (180 mg total) by mouth daily. 30 capsule 6  . FLOVENT HFA 110 MCG/ACT inhaler inhale 1 puff by mouth twice a day 12 g 11  . fluticasone (FLONASE) 50 MCG/ACT nasal spray instill 2 sprays into each nostril once daily 16 g 11  . guaiFENesin (MUCINEX) 600 MG 12 hr tablet Take 1 tablet (600 mg total) by mouth 2 (two) times daily. 30 tablet 0  . guaiFENesin (ROBITUSSIN) 100 MG/5ML liquid Take 100 mg by mouth 3 (three) times daily as needed for cough.    .  isosorbide dinitrate (ISORDIL) 30 MG tablet Take 1 tablet (30 mg total) by mouth 2 (two) times daily. 60 tablet 6  . loratadine (CLARITIN) 10 MG tablet Take 10 mg by mouth daily.      . metoprolol succinate (TOPROL-XL) 50 MG 24 hr tablet take 1 tablet by mouth once daily WITH BREAKFAST TAKE WITH OR IMMEDIATELY FOLLOWING A MEAL 90 tablet 3  . nitroGLYCERIN (NITROSTAT) 0.4 MG SL tablet Place 0.4 mg under the tongue every 5 (five) minutes as needed for chest pain (MAX 3 Tablets).    Marland Kitchen omeprazole (PRILOSEC) 20 MG capsule Take 2 capsules (40 mg total) by mouth daily. 60 capsule 2  . rosuvastatin (CRESTOR) 40 MG tablet Take 1 tablet (40 mg total) by mouth daily. 90 tablet 3  . traMADol (ULTRAM) 50 MG tablet Take 2 tablets (100 mg total) by mouth 3 (three) times daily as needed (pain). 60 tablet 5  . valsartan (DIOVAN) 40 MG tablet Take 1 tablet (40 mg total) by mouth daily. 90 tablet 1   No current facility-administered medications for this visit.    Allergies:   Bactrim; Amitriptyline hcl; Aspirin; and Zetia    Social History:  The patient  reports that she has never smoked. She quit smokeless tobacco use about 13 years ago. Her smokeless tobacco use included Snuff. She reports that she does not drink alcohol or use illicit drugs.   Family History:  The patient's family history includes Heart failure in her mother; Other in her father.    ROS:  General:no colds or fevers, no weight changes, + fatigue  Skin:no rashes or ulcers HEENT:no blurred vision, no congestion CV:see HPI PUL:see HPI GI:no diarrhea constipation or melena, no indigestion GU:no hematuria, no dysuria MS:no joint pain, no claudication Neuro:no syncope, no lightheadedness Endo:no diabetes, no thyroid disease  Wt Readings from Last 3 Encounters:  11/12/14 140 lb 3.2 oz (63.594 kg)  10/30/14 139 lb (63.05 kg)  10/23/14 139 lb 1.8 oz (63.1 kg)     PHYSICAL EXAM: VS:  BP 115/53 mmHg  Pulse 54  Ht 5\' 4"  (1.626 m)  Wt 140  lb 3.2 oz (63.594 kg)  BMI 24.05 kg/m2  SpO2 97% , BMI Body mass index is 24.05 kg/(m^2). General:Pleasant affect, NAD Skin:Warm and dry, brisk capillary refill HEENT:normocephalic, sclera clear, mucus membranes moist Neck:supple, no JVD, no bruits  Heart:S1S2 RRR with 3/6 systolic murmur, no gallup, rub or click Lungs:clear without rales, rhonchi, or wheezes QQI:WLNL, non tender, + BS, do not palpate liver spleen or masses GXQ:JJHER to 1+  lower ext edema, 2+ pedal pulses, 2+ radial pulses Neuro:alert and oriented X 3, MAE,  follows commands, + facial symmetry    EKG:  EKG is ordered today. The ekg ordered today demonstrates SB with PACs, PR 252ms 1st degree AV block.  HR 54. Rate is slower than previous. Other wise no acute changes.   Recent Labs: 05/19/2014: TSH 2.936 10/23/2014: B Natriuretic Peptide 289.2*; BUN 16; Creatinine, Ser 0.66; Hemoglobin 11.7*; Platelets 198; Potassium 3.6; Sodium 139    Lipid Panel    Component Value Date/Time   CHOL 153 06/26/2014 1548   TRIG 132 06/26/2014 1548   HDL 65 06/26/2014 1548   CHOLHDL 2.4 06/26/2014 1548   VLDL 26 06/26/2014 1548   LDLCALC 62 06/26/2014 1548       Other studies Reviewed: Additional studies/ records that were reviewed today include: Echo:.10/23/14 Study Conclusions  - Left ventricle: The cavity size was normal. Wall thickness was normal. Systolic function was normal. The estimated ejection fraction was in the range of 55% to 60%. Wall motion was normal; there were no regional wall motion abnormalities. Features are consistent with a pseudonormal left ventricular filling pattern, with concomitant abnormal relaxation and increased filling pressure (grade 2 diastolic dysfunction). - Aortic valve: There was moderate to severe stenosis. Valve area (VTI): 0.9 cm^2. Valve area (Vmax): 0.8 cm^2. Valve area (Vmean): 0.83 cm^2. Gradients are probably underestimated. - Mitral valve: Calcified annulus. -  Tricuspid valve: There was moderate regurgitation.  NUC Study: 10/23/14  There was no ST segment deviation noted during stress.  No T wave inversion was noted during stress.  Findings consistent with prior myocardial infarction.  This is a low risk study.  The left ventricular ejection fraction is normal (55-65%).  Defect 1: There is a medium defect of moderate severity present in the apical lateral location.        ASSESSMENT AND PLAN:  1. CAD s/p CABG recent chest pain but neg nuc study - low risk biochemical and ECG markers. Discussed conservative versus invasive initial approach. In the absence of ongoing angina and low risk markers,  No further pain since discharge.  2. Mitral regurgitation: Moderate by echo September 2015. No regurg noted on echo 10/2014   3. Aortic stenosis: Moderately severe by echo September 2015 with mean gradient of 28 mmHg. At this time, she does not meet criteria for consideration for AVR. She would not be a candidate for open surgical replacement. She could be considered for TAVR in the future. See above for report. Follow up with Dr. Angelena Form in [place].  May need repeat echo prior - will ask Dr. Angelena Form to review.   5. Hyperlipidemia: She is on a statin.   6. Atrial fibrillation: Sinus today. She does not wish to consider anti-coagulation due to prior bleeding. Will continue beta blocker and Cardizem. Clopidogrel.  7.  Bradycardia: Now with fatigue and some lower ext edema, will decrease cardizem to 180 mg daily.  i explained she may go back into a fib.  But with decreased HR and her fatigue.    8.. Carotid artery disease: Stable moderate bilateral disease with patent right CEA site and 40-59% bilateral ICA stenosis by dopplers October 2015.     Current medicines are reviewed with the patient today.  The patient Has no concerns regarding medicines.  The following changes have been made:  See above Labs/ tests ordered today include:see  above  Disposition:   FU:  see above  Signed, Isaiah Serge, NP  11/13/2014 7:02 PM    [address] [place],  [place], [place] Phone: [phone]; Fax: [phone]

## 2014-11-20 ENCOUNTER — Telehealth: Payer: Self-pay | Admitting: *Deleted

## 2014-11-20 NOTE — Telephone Encounter (Signed)
Received message from Dr. Angelena Form that pt did not need echo in September. I spoke with pt and gave her this information. Echo appt cancelled and pt will keep scheduled appt with Dr. Angelena Form on February 10, 2015 for follow up.

## 2014-11-20 NOTE — Addendum Note (Signed)
Addended by: Thompson Grayer on: 11/20/2014 12:22 PM   Modules accepted: Orders

## 2015-01-11 ENCOUNTER — Other Ambulatory Visit: Payer: Self-pay | Admitting: Family Medicine

## 2015-01-23 ENCOUNTER — Other Ambulatory Visit (HOSPITAL_COMMUNITY): Payer: Medicare Other

## 2015-02-16 ENCOUNTER — Other Ambulatory Visit: Payer: Self-pay

## 2015-02-16 ENCOUNTER — Other Ambulatory Visit: Payer: Self-pay | Admitting: Family Medicine

## 2015-02-16 NOTE — Telephone Encounter (Signed)
IS THIS OKAY 

## 2015-02-19 NOTE — Progress Notes (Signed)
Chief Complaint  Patient presents with  . Chest Pain     History of Present Illness: 81 yo female with history of CAD status post bypass surgery in September 2006 (Dr. Prescott Gum) as well as a Cox-Maze procedure for atrial fibrillation, CVA, carotid artery stenosis s/p R CEA, fibromyalgia, GERD, hiatal hernia, severe kyphoscoloisis, moderate MR, moderately severe AS here today for cardiac follow up.  Myoview 8/10: EF 70% small lateral infarct no ischemia. I saw her in February 2013 and she had c/o occasional chest pains which were atypical. Carotid artery dopplers April 2013 with 40-59% bilateral stenosis with stable RCEA site (she has refused repeat testing).  She was seen in the ED on 11/12/11 and had negative cardiac markers and no EKG changes. Admitted to [facility] 07/30/12 with lateral wall rib pain. Cardiac markers negative. Last echo September 2014 with normal LV function, moderately severe AS with mean gradient 28 mmHg, mild MR. She has seen Dr. Collene Mares with GI and had a swallowing study for "choking sensation". She has seen an ENT as well. Admitted to [facility] December 2015 with pneumonia and atrial fib with RVR. Her Toprol and Diltiazem were increased for rate control. Her Plavix was stopped and she was placed on Eliquis with plans for an OP cardioversion. After discharge she developed gross hematuria and stopped the Eliquis and stated that she was not interested in going back on anticoagulation. She was started back on Plavix. She was admitted to [facility] 10/23/2014 for chest pain. Stress myoview was negative for ischemia. She was in sinus during her hospitalization. Echo 10/23/14 with moderate to severe AS, LVEF 55-60% with normal wall motion. G2 diastolic dysfunction, moderate TR. She was seen in our office 11/12/14 by Cecilie Kicks, NP and was doing well.   She is here today for follow up. She is feeling well. She has stable dyspnea with exertion. She has rare chest pains. These only last for a few  seconds. No syncope. No lower ext edema. She does not wish to start anti-coagulation.   Primary Care Physician: Jill Alexanders  Last Lipid Profile:Lipid Panel     Component Value Date/Time   CHOL 153 06/26/2014 1548   TRIG 132 06/26/2014 1548   HDL 65 06/26/2014 1548   CHOLHDL 2.4 06/26/2014 1548   VLDL 26 06/26/2014 1548   LDLCALC 62 06/26/2014 1548    Past Medical History  Diagnosis Date  . Arthritis   . Asthma   . GERD (gastroesophageal reflux disease)   . Hyperlipidemia   . Myocardial infarction 2003    Stent to CFX  . Aspirin allergy     on Plavix  . Hypertension   . Eczema   . ASHD (arteriosclerotic heart disease)   . CAD (coronary artery disease) 06/2001    CABG '06  . Bronchitis   . Stroke   . Atrial fibrillation 04/2014    declined anticoagulation     Past Surgical History  Procedure Laterality Date  . Abdominal hysterectomy  1976  . Carotid endarterectomy  2010  . Coronary artery bypass graft  01/2005    LIMA-D1; SVG-LAD; SVG-OM; SVG-PDA  . Tonsillectomy    . Cardiac surgery  stents  . Tumor removal      Current Outpatient Prescriptions  Medication Sig Dispense Refill  . albuterol (PROVENTIL HFA;VENTOLIN HFA) 108 (90 BASE) MCG/ACT inhaler Inhale 2 puffs into the lungs every 6 (six) hours as needed for wheezing or shortness of breath. 1 Inhaler 3  . calcium-vitamin D (  OSCAL WITH D) 500-200 MG-UNIT per tablet Take 1 tablet by mouth daily with breakfast.    . cholecalciferol (VITAMIN D) 1000 UNITS tablet Take 1,000 Units by mouth daily.    . clopidogrel (PLAVIX) 75 MG tablet Take 1 tablet (75 mg total) by mouth daily. 30 tablet 11  . diazepam (VALIUM) 2 MG tablet Take 2 mg by mouth every 6 (six) hours as needed (dizziness).    Marland Kitchen diltiazem (CARDIZEM CD) 180 MG 24 hr capsule Take 1 capsule (180 mg total) by mouth daily. 30 capsule 6  . FLOVENT HFA 110 MCG/ACT inhaler inhale 1 puff by mouth twice a day 12 g 11  . fluticasone (FLONASE) 50 MCG/ACT nasal spray  instill 2 sprays into each nostril once daily 16 g 11  . guaiFENesin (MUCINEX) 600 MG 12 hr tablet Take 1 tablet (600 mg total) by mouth 2 (two) times daily. 30 tablet 0  . guaiFENesin (ROBITUSSIN) 100 MG/5ML liquid Take 100 mg by mouth 3 (three) times daily as needed for cough.    . isosorbide dinitrate (ISORDIL) 30 MG tablet Take 1 tablet (30 mg total) by mouth 2 (two) times daily. 60 tablet 6  . loratadine (CLARITIN) 10 MG tablet Take 10 mg by mouth daily.      . metoprolol succinate (TOPROL-XL) 50 MG 24 hr tablet take 1 tablet by mouth once daily WITH BREAKFAST TAKE WITH OR IMMEDIATELY FOLLOWING A MEAL 90 tablet 3  . nitroGLYCERIN (NITROSTAT) 0.4 MG SL tablet Place 1 tablet (0.4 mg total) under the tongue every 5 (five) minutes as needed for chest pain (MAX 3 Tablets). 25 tablet 6  . omeprazole (PRILOSEC) 20 MG capsule take 2 capsules by mouth daily 60 capsule 2  . rosuvastatin (CRESTOR) 40 MG tablet Take 1 tablet (40 mg total) by mouth daily. 90 tablet 3  . traMADol (ULTRAM) 50 MG tablet take 2 tablets by mouth three times a day if needed for pain 60 tablet 5  . valsartan (DIOVAN) 40 MG tablet Take 1 tablet (40 mg total) by mouth daily. 90 tablet 1   No current facility-administered medications for this visit.    Allergies  Allergen Reactions  . Bactrim [Sulfamethoxazole-Trimethoprim] Other (See Comments)    Makes her very strange   . Amitriptyline Hcl Rash  . Aspirin Swelling and Rash  . Zetia [Ezetimibe] Rash    Social History   Social History  . Marital Status: Widowed    Spouse Name: N/A  . Number of Children: 3  . Years of Education: N/A   Occupational History  .     Social History Main Topics  . Smoking status: Never Smoker   . Smokeless tobacco: Former Systems developer    Types: Snuff    Quit date: 05/23/2001  . Alcohol Use: No  . Drug Use: No  . Sexual Activity: Not Currently   Other Topics Concern  . Not on file   Social History Narrative    Family History    Problem Relation Age of Onset  . Heart failure Mother   . Other Father     Review of Systems:  As stated in the HPI and otherwise negative.   BP 98/64 mmHg  Pulse 70  Ht 5\' 4"  (1.626 m)  Wt 110 lb (49.896 kg)  BMI 18.87 kg/m2  Physical Examination: General: Well developed, well nourished, NAD HEENT: OP clear, mucus membranes moist SKIN: warm, dry. No rashes. Neuro: No focal deficits Musculoskeletal: Muscle strength 5/5 all ext Psychiatric: Mood  and affect normal Neck: No JVD, no carotid bruits, no thyromegaly, no lymphadenopathy. Lungs:Clear bilaterally, no wheezes, rhonci, crackles Cardiovascular: Regular rate and rhythm. Loud systolic murmur. No gallops or rubs. Abdomen:Soft. Bowel sounds present. Non-tender.  Extremities: No lower extremity edema. Pulses are 2 + in the bilateral DP/PT.  Echo 10/23/14: Left ventricle: The cavity size was normal. Wall thickness was normal. Systolic function was normal. The estimated ejection fraction was in the range of 55% to 60%. Wall motion was normal; there were no regional wall motion abnormalities. Features are consistent with a pseudonormal left ventricular filling pattern, with concomitant abnormal relaxation and increased filling pressure (grade 2 diastolic dysfunction). - Aortic valve: There was moderate to severe stenosis. Valve area (VTI): 0.9 cm^2. Valve area (Vmax): 0.8 cm^2. Valve area (Vmean): 0.83 cm^2. Gradients are probably underestimated. - Mitral valve: Calcified annulus. - Tricuspid valve: There was moderate regurgitation.  EKG:  EKG is not ordered today. The ekg ordered today demonstrates   Recent Labs: 05/19/2014: TSH 2.936 10/23/2014: B Natriuretic Peptide 289.2*; BUN 16; Creatinine, Ser 0.66; Hemoglobin 11.7*; Platelets 198; Potassium 3.6; Sodium 139   Lipid Panel    Component Value Date/Time   CHOL 153 06/26/2014 1548   TRIG 132 06/26/2014 1548   HDL 65 06/26/2014 1548   CHOLHDL 2.4  06/26/2014 1548   VLDL 26 06/26/2014 1548   LDLCALC 62 06/26/2014 1548     Wt Readings from Last 3 Encounters:  02/20/15 110 lb (49.896 kg)  11/12/14 140 lb 3.2 oz (63.594 kg)  10/30/14 139 lb (63.05 kg)     Other studies Reviewed: Additional studies/ records that were reviewed today include: . Review of the above records demonstrates:    Assessment and Plan:   1. Carotid artery disease: Stable moderate bilateral disease with patent right CEA site and 40-59% bilateral ICA stenosis by dopplers October 2015. Repeat Oct 2017.  2. Mitral regurgitation: Mild by echo June 2016. Will folllow.   3. CAD: She is s/p bypass and doing well. She has chronic, stable angina. Stress myoview without ischemia June 2016. Continue current meds. She is intolerant to ASA but takes clopidogrel daily.   4. AORTIC STENOSIS: Moderately severe by echo mean gradient of 21 mmHg, peak gradient 40 mmHg June 2016. At this time, she does not meet criteria for consideration for AVR. She would not be a candidate for open surgical replacement. She could be considered for TAVR in the future. Will follow and repeat echo in one year.  5. Hyperlipidemia: She is on a statin. Lipids well controlled.   6. Atrial fibrillation: Appears to be in sinus today. She does not wish to consider anti-coagulation due to prior bleeding. We have had another long discussion regarding risk of CVA off of anti-coagulation with PAF. Will continue beta blocker and Cardizem.   Current medicines are reviewed at length with the patient today.  The patient does not have concerns regarding medicines.  The following changes have been made:  no change  Labs/ tests ordered today include:  No orders of the defined types were placed in this encounter.    Disposition:   FU with me in 6 months  Signed, Lauree Chandler, MD 02/20/2015 9:33 AM    [facility] [place], [place], [place]  [address] Phone: [phone];  Fax: [phone]

## 2015-02-20 ENCOUNTER — Encounter: Payer: Self-pay | Admitting: Cardiovascular Disease

## 2015-02-20 ENCOUNTER — Ambulatory Visit (INDEPENDENT_AMBULATORY_CARE_PROVIDER_SITE_OTHER): Payer: Medicare Other | Admitting: Cardiovascular Disease

## 2015-02-20 VITALS — BP 98/64 | HR 70 | Ht 64.0 in | Wt 136.0 lb

## 2015-02-20 DIAGNOSIS — I25810 Atherosclerosis of coronary artery bypass graft(s) without angina pectoris: Secondary | ICD-10-CM | POA: Diagnosis not present

## 2015-02-20 DIAGNOSIS — I25119 Atherosclerotic heart disease of native coronary artery with unspecified angina pectoris: Secondary | ICD-10-CM | POA: Diagnosis not present

## 2015-02-20 DIAGNOSIS — I779 Disorder of arteries and arterioles, unspecified: Secondary | ICD-10-CM | POA: Diagnosis not present

## 2015-02-20 DIAGNOSIS — E785 Hyperlipidemia, unspecified: Secondary | ICD-10-CM

## 2015-02-20 DIAGNOSIS — I739 Peripheral vascular disease, unspecified: Secondary | ICD-10-CM

## 2015-02-20 DIAGNOSIS — I480 Paroxysmal atrial fibrillation: Secondary | ICD-10-CM | POA: Diagnosis not present

## 2015-02-20 DIAGNOSIS — I340 Nonrheumatic mitral (valve) insufficiency: Secondary | ICD-10-CM

## 2015-02-20 DIAGNOSIS — I350 Nonrheumatic aortic (valve) stenosis: Secondary | ICD-10-CM | POA: Diagnosis not present

## 2015-02-20 MED ORDER — NITROGLYCERIN 0.4 MG SL SUBL: 0.4000 mg | SUBLINGUAL_TABLET | SUBLINGUAL | Status: DC | PRN

## 2015-02-20 NOTE — Patient Instructions (Signed)

## 2015-02-23 ENCOUNTER — Other Ambulatory Visit: Payer: Self-pay

## 2015-02-23 MED ORDER — NITROGLYCERIN 0.4 MG SL SUBL: 0.4000 mg | SUBLINGUAL_TABLET | SUBLINGUAL | Status: DC | PRN

## 2015-02-23 NOTE — Telephone Encounter (Signed)
Per Gilford Rile RN ok to refill 100 tabs

## 2015-03-03 ENCOUNTER — Other Ambulatory Visit: Payer: Self-pay | Admitting: Cardiovascular Disease

## 2015-03-03 MED ORDER — VALSARTAN 40 MG PO TABS: 40.0000 mg | ORAL_TABLET | Freq: Every day | ORAL | Status: DC

## 2015-03-12 DIAGNOSIS — L821 Other seborrheic keratosis: Secondary | ICD-10-CM | POA: Diagnosis not present

## 2015-03-12 DIAGNOSIS — D1801 Hemangioma of skin and subcutaneous tissue: Secondary | ICD-10-CM | POA: Diagnosis not present

## 2015-03-12 DIAGNOSIS — D0462 Carcinoma in situ of skin of left upper limb, including shoulder: Secondary | ICD-10-CM | POA: Diagnosis not present

## 2015-03-12 DIAGNOSIS — D485 Neoplasm of uncertain behavior of skin: Secondary | ICD-10-CM | POA: Diagnosis not present

## 2015-03-12 DIAGNOSIS — L918 Other hypertrophic disorders of the skin: Secondary | ICD-10-CM | POA: Diagnosis not present

## 2015-03-16 DIAGNOSIS — D049 Carcinoma in situ of skin, unspecified: Secondary | ICD-10-CM | POA: Insufficient documentation

## 2015-03-18 DIAGNOSIS — L603 Nail dystrophy: Secondary | ICD-10-CM | POA: Diagnosis not present

## 2015-03-18 DIAGNOSIS — D0462 Carcinoma in situ of skin of left upper limb, including shoulder: Secondary | ICD-10-CM | POA: Diagnosis not present

## 2015-03-19 ENCOUNTER — Encounter: Payer: Self-pay | Admitting: Family Medicine

## 2015-03-19 DIAGNOSIS — D049 Carcinoma in situ of skin, unspecified: Secondary | ICD-10-CM

## 2015-03-24 ENCOUNTER — Encounter: Payer: Self-pay | Admitting: Family Medicine

## 2015-04-07 ENCOUNTER — Other Ambulatory Visit: Payer: Self-pay | Admitting: Family Medicine

## 2015-04-09 ENCOUNTER — Telehealth: Payer: Self-pay | Admitting: Family Medicine

## 2015-04-09 MED ORDER — ALBUTEROL SULFATE HFA 108 (90 BASE) MCG/ACT IN AERS: 2.0000 | INHALATION_SPRAY | Freq: Four times a day (QID) | RESPIRATORY_TRACT | Status: DC | PRN

## 2015-04-09 NOTE — Telephone Encounter (Signed)
Pt called and states insurance will not pay for Proventil.  Insurance WILL PAY for Ventolin spray.  Please send in to [facility] on [address].

## 2015-04-13 ENCOUNTER — Telehealth: Payer: Self-pay | Admitting: Family Medicine

## 2015-04-13 NOTE — Telephone Encounter (Signed)
Pt state insurance only pays for Ventolin, not Proventil.  I called pharmacy and changed

## 2015-04-25 DIAGNOSIS — Z23 Encounter for immunization: Secondary | ICD-10-CM | POA: Diagnosis not present

## 2015-05-12 ENCOUNTER — Other Ambulatory Visit: Payer: Self-pay | Admitting: *Deleted

## 2015-05-12 MED ORDER — ISOSORBIDE DINITRATE 30 MG PO TABS: 30.0000 mg | ORAL_TABLET | Freq: Two times a day (BID) | ORAL | Status: DC

## 2015-06-03 ENCOUNTER — Other Ambulatory Visit: Payer: Self-pay | Admitting: Cardiology

## 2015-06-03 NOTE — Telephone Encounter (Signed)
Rx request sent to pharmacy.  

## 2015-07-15 ENCOUNTER — Other Ambulatory Visit: Payer: Self-pay | Admitting: Family Medicine

## 2015-07-15 ENCOUNTER — Telehealth: Payer: Self-pay | Admitting: Family Medicine

## 2015-07-15 MED ORDER — ROSUVASTATIN CALCIUM 40 MG PO TABS: 40.0000 mg | ORAL_TABLET | Freq: Every day | ORAL | Status: DC

## 2015-07-15 NOTE — Telephone Encounter (Signed)
Refilled and called pt and set up cpe

## 2015-07-15 NOTE — Telephone Encounter (Signed)
Rcvd refill request for Rosuvastatin 40mg  #90

## 2015-07-16 ENCOUNTER — Telehealth: Payer: Self-pay | Admitting: Family Medicine

## 2015-07-16 MED ORDER — FLUTICASONE PROPIONATE 50 MCG/ACT NA SUSP: NASAL | Status: DC

## 2015-07-16 NOTE — Telephone Encounter (Signed)
Filled for one month's worth of Flonase, called patient and let her know. Also let her know that Dr.Lalonde will refill at her [facility] month x 1 year. Patient verbalized understanding.

## 2015-07-16 NOTE — Telephone Encounter (Signed)
Pt called and was requesting a refill on her Flonase said she called the pharmacy, pt uses [facility], [place]

## 2015-07-30 ENCOUNTER — Telehealth: Payer: Self-pay | Admitting: Family Medicine

## 2015-07-30 MED ORDER — METOPROLOL SUCCINATE ER 50 MG PO TB24: ORAL_TABLET | ORAL | Status: DC

## 2015-07-30 NOTE — Telephone Encounter (Signed)
Rcvd refill request for Metoprolol Succinate ER 50mg  #90

## 2015-07-30 NOTE — Telephone Encounter (Signed)
done

## 2015-07-31 ENCOUNTER — Other Ambulatory Visit: Payer: Self-pay | Admitting: Cardiovascular Disease

## 2015-07-31 ENCOUNTER — Encounter: Payer: Self-pay | Admitting: *Deleted

## 2015-08-03 ENCOUNTER — Telehealth: Payer: Self-pay | Admitting: Family Medicine

## 2015-08-03 NOTE — Telephone Encounter (Signed)
[facility] called & states Proventil not covered, but Ventolin is.  Called pharmacy and switched again to Ventolin

## 2015-08-10 ENCOUNTER — Other Ambulatory Visit: Payer: Self-pay | Admitting: Cardiovascular Disease

## 2015-08-17 ENCOUNTER — Telehealth: Payer: Self-pay | Admitting: Family Medicine

## 2015-08-17 MED ORDER — FLUTICASONE PROPIONATE HFA 110 MCG/ACT IN AERO: INHALATION_SPRAY | RESPIRATORY_TRACT | Status: DC

## 2015-08-17 NOTE — Telephone Encounter (Signed)
Refill request from [facility] [address]  flovent hfa 110 mcg inhaler 1puff by mouth 2 times daily

## 2015-08-21 ENCOUNTER — Encounter: Payer: Self-pay | Admitting: Family Medicine

## 2015-08-21 ENCOUNTER — Ambulatory Visit (INDEPENDENT_AMBULATORY_CARE_PROVIDER_SITE_OTHER): Payer: Medicare Other | Admitting: Family Medicine

## 2015-08-21 VITALS — BP 124/62 | HR 74 | Resp 12 | Ht 64.0 in | Wt 134.0 lb

## 2015-08-21 DIAGNOSIS — I340 Nonrheumatic mitral (valve) insufficiency: Secondary | ICD-10-CM

## 2015-08-21 DIAGNOSIS — J4540 Moderate persistent asthma, uncomplicated: Secondary | ICD-10-CM

## 2015-08-21 DIAGNOSIS — I350 Nonrheumatic aortic (valve) stenosis: Secondary | ICD-10-CM | POA: Diagnosis not present

## 2015-08-21 DIAGNOSIS — K219 Gastro-esophageal reflux disease without esophagitis: Secondary | ICD-10-CM

## 2015-08-21 DIAGNOSIS — R609 Edema, unspecified: Secondary | ICD-10-CM

## 2015-08-21 DIAGNOSIS — E785 Hyperlipidemia, unspecified: Secondary | ICD-10-CM | POA: Diagnosis not present

## 2015-08-21 DIAGNOSIS — I348 Other nonrheumatic mitral valve disorders: Secondary | ICD-10-CM | POA: Diagnosis not present

## 2015-08-21 DIAGNOSIS — J45909 Unspecified asthma, uncomplicated: Secondary | ICD-10-CM | POA: Diagnosis not present

## 2015-08-21 DIAGNOSIS — I359 Nonrheumatic aortic valve disorder, unspecified: Secondary | ICD-10-CM | POA: Diagnosis not present

## 2015-08-21 DIAGNOSIS — J302 Other seasonal allergic rhinitis: Secondary | ICD-10-CM

## 2015-08-21 LAB — CBC WITH DIFFERENTIAL/PLATELET
BASOS PCT: 0 % (ref 0–1)
Basophils Absolute: 0 10*3/uL (ref 0.0–0.1)
EOS ABS: 0.1 10*3/uL (ref 0.0–0.7)
EOS PCT: 2 % (ref 0–5)
HCT: 36.5 % (ref 36.0–46.0)
Hemoglobin: 12.2 g/dL (ref 12.0–15.0)
Lymphocytes Relative: 22 % (ref 12–46)
Lymphs Abs: 1.4 10*3/uL (ref 0.7–4.0)
MCH: 30.7 pg (ref 26.0–34.0)
MCHC: 33.4 g/dL (ref 30.0–36.0)
MCV: 91.7 fL (ref 78.0–100.0)
MONO ABS: 0.4 10*3/uL (ref 0.1–1.0)
MONOS PCT: 6 % (ref 3–12)
MPV: 9.1 fL (ref 8.6–12.4)
Neutro Abs: 4.4 10*3/uL (ref 1.7–7.7)
Neutrophils Relative %: 70 % (ref 43–77)
PLATELETS: 198 10*3/uL (ref 150–400)
RBC: 3.98 MIL/uL (ref 3.87–5.11)
RDW: 13.8 % (ref 11.5–15.5)
WBC: 6.3 10*3/uL (ref 4.0–10.5)

## 2015-08-21 LAB — COMPREHENSIVE METABOLIC PANEL
ALBUMIN: 4.3 g/dL (ref 3.6–5.1)
ALK PHOS: 44 U/L (ref 33–130)
ALT: 10 U/L (ref 6–29)
AST: 14 U/L (ref 10–35)
BILIRUBIN TOTAL: 0.5 mg/dL (ref 0.2–1.2)
BUN: 15 mg/dL (ref 7–25)
CALCIUM: 9.4 mg/dL (ref 8.6–10.4)
CO2: 27 mmol/L (ref 20–31)
Chloride: 103 mmol/L (ref 98–110)
Creat: 0.41 mg/dL — ABNORMAL LOW (ref 0.60–0.88)
GLUCOSE: 104 mg/dL — AB (ref 65–99)
Potassium: 4.1 mmol/L (ref 3.5–5.3)
SODIUM: 140 mmol/L (ref 135–146)
Total Protein: 6.6 g/dL (ref 6.1–8.1)

## 2015-08-21 LAB — LIPID PANEL
CHOLESTEROL: 144 mg/dL (ref 125–200)
HDL: 73 mg/dL (ref >=46)
LDL Cholesterol: 54 mg/dL (ref <130)
Total CHOL/HDL Ratio: 2 Ratio (ref <=5.0)
Triglycerides: 87 mg/dL (ref <150)
VLDL: 17 mg/dL (ref <30)

## 2015-08-21 NOTE — Progress Notes (Signed)
[name] is a 82 y.o. female who presents for annual wellness visit and follow-up on chronic medical conditions.  She has the following concerns:Does have underlying cardiac disease and is followed regularly by her cardiologist. At this point she seems to be relatively stable. She does have reflux disease and does use her PPI regularly. Presently she has had no chest pain, shortness of breath. She continues on her allergy and asthma medications and seems to be fairly stable on this. She does not complain of any cough. She does not complain of any leg edema. Overall things seem to be relatively status quo.   Immunization History  Administered Date(s) Administered  . Influenza Split 03/28/2012, 04/28/2015  . Influenza Whole 02/23/2009  . Influenza, High Dose Seasonal PF 03/14/2013  . Pneumococcal Conjugate-13 06/14/2013  . Pneumococcal Polysaccharide-23 12/22/2008  . Tdap 10/11/2006  . Zoster 01/01/2009   Last Pap smear: Patient does not remember Last mammogram: Patient does not remember Last colonoscopy: 2009- Dr Collene Mares Last DEXA: Not done Dentist: over 6 years  Ophtho:over 10 years.Dr Gershon Crane Exercise:Patient is not able to exercise  Other doctors caring for patient include: Dr. Angelena Form   Depression screen:  See questionnaire below.  Depression screen PHQ 2/9 01/06/2014  Decreased Interest 0  Down, Depressed, Hopeless 0  PHQ - 2 Score 0    Fall Risk Screen: see questionnaire below. Fall Risk  01/06/2014  Falls in the past year? No    ADL screen:  See questionnaire below Functional Status Survey:Her physical activity level is quite limited due to her underlying cardiac condition as well as general deconditioning.     End of Life Discussion:  Patient does not have a living will and medical power of attorney.Information given.  Review of Systems Constitutional: -fever, -chills, -sweats, -unexpected weight change, -anorexia, -fatigue Allergy: -sneezing, -itching,  -congestion Psychology:  -depressed mood, -agitation, -sleep problems    PHYSICAL EXAM:  General Appearance: Alert, cooperative, no distress, appears stated age Head: Normocephalic, without obvious abnormality, atraumatic Eyes: PERRL, conjunctiva/corneas clear, EOM's intact, fundi benign Ears: Normal TM's and external ear canals Nose: Nares normal, mucosa normal, no drainage or sinus   tenderness Throat: Lips, mucosa, and tongue normal; teeth and gums normal Neck: Supple, no lymphadenopathy; thyroid: no enlargement/tenderness/nodules; no carotid bruit or JVD Lungs: Clear to auscultation bilaterally without wheezes, rales or ronchi; respirations unlabored Chest Wall: No tenderness or deformity Heart: Regular rate and rhythm, S1 and S2 normal, Systolic murmur is noted. Rectal: Normal tone, no masses or tenderness; guaiac negative stool Extremities: No clubbing, cyanosis or edema Pulses: 2+ and symmetric all extremities Skin: Skin color, texture, turgor normal, no rashes or lesions Lymph nodes: Cervical, supraclavicular, and axillary nodes normal Neurologic: CNII-XII intact, normal strength, sensation and gait; reflexes 2+ and symmetric throughout Psych: Normal mood, affect, hygiene and grooming.  ASSESSMENT/PLAN: Aortic stenosis, moderate - Plan: CBC with Differential/Platelet, Comprehensive metabolic panel, Lipid panel  Asthma, moderate persistent, uncomplicated - Plan: CBC with Differential/Platelet, Comprehensive metabolic panel, Lipid panel  Gastroesophageal reflux disease without esophagitis  Dependent edema - Plan: CBC with Differential/Platelet, Comprehensive metabolic panel, Lipid panel  Hyperlipidemia - Plan: CBC with Differential/Platelet, Comprehensive metabolic panel, Lipid panel  Other seasonal allergic rhinitis  Severe aortic stenosis  Mitral regurgitation - Plan: CBC with Differential/Platelet, Comprehensive metabolic panel, Lipid panel Recommend she cut back on  her PPI and use it on an as-needed basis. Encouraged her to be, is physically active as possible although I doubt she will change her lifestyle.  Discussed the use of rescue inhaler and to let me know if she uses it more than twice per day during the week or twice per month at night. Also recommend getting a med alert.    Discussed monthly self breast exams and yearly mammograms; at least 30 minutes of aerobic activity at least 5 days/week and weight-bearing exercise 2x/week; proper sunscreen use reviewed; healthy diet, including goals of calcium and vitamin D intake and alcohol recommendations (less than or equal to 1 drink/day) reviewed; regular seatbelt use; changing batteries in smoke detectors.  Immunization recommendations discussed.  Colonoscopy recommendations reviewed   Medicare Attestation I have personally reviewed: The patient's medical and social history Their use of alcohol, tobacco or illicit drugs Their current medications and supplements The patient's functional ability including ADLs,fall risks, home safety risks, cognitive, and hearing and visual impairment Diet and physical activities Evidence for depression or mood disorders  The patient's weight, height, and BMI have been recorded in the chart.  I have made referrals, counseling, and provided education to the patient based on review of the above and I have provided the patient with a written personalized care plan for preventive services.     Wyatt Haste, MD   08/21/2015

## 2015-08-21 NOTE — Patient Instructions (Addendum)
Try to take Prilosec every other day and then every third day to controlhe reflux, If you use your rescue inhaler more than twice a week during the day or twice a month at night I need to know  Ms. [name] , Thank you for taking time to come for your Medicare Wellness Visit. I appreciate your ongoing commitment to your health goals. Please review the following plan we discussed and let me know if I can assist you in the future.   These are the goals we discussed: Back on the PPI based on reflux symptoms.  This is a list of the screening recommended for you and due dates:  Health Maintenance  Topic Date Due  . DEXA scan (bone density measurement)  03/04/1996  . Flu Shot  12/22/2015  . Tetanus Vaccine  10/10/2016  . Shingles Vaccine  Completed  . Pneumonia vaccines  Completed  Do not feel a DEXA scan would be appropriate.

## 2015-08-27 ENCOUNTER — Telehealth: Payer: Self-pay | Admitting: Family Medicine

## 2015-08-27 MED ORDER — TRAMADOL HCL 50 MG PO TABS: ORAL_TABLET | ORAL | Status: DC

## 2015-08-27 NOTE — Telephone Encounter (Signed)
Phoned in #[phone] with 3 refills.

## 2015-08-27 NOTE — Telephone Encounter (Signed)
Pt's daughter called for refill of Tramadol. Send to [place]

## 2015-08-27 NOTE — Telephone Encounter (Signed)
Okay to renew with several refills

## 2015-09-01 ENCOUNTER — Other Ambulatory Visit: Payer: Self-pay

## 2015-09-01 ENCOUNTER — Other Ambulatory Visit: Payer: Self-pay | Admitting: Cardiovascular Disease

## 2015-09-01 ENCOUNTER — Telehealth: Payer: Self-pay

## 2015-09-01 MED ORDER — FLUTICASONE PROPIONATE 50 MCG/ACT NA SUSP: NASAL | Status: DC

## 2015-09-01 NOTE — Telephone Encounter (Signed)
Sent in flonase  

## 2015-09-01 NOTE — Telephone Encounter (Signed)
Received refill request for fluticasone nasal spray

## 2015-09-09 ENCOUNTER — Other Ambulatory Visit: Payer: Self-pay | Admitting: Cardiovascular Disease

## 2015-09-29 ENCOUNTER — Other Ambulatory Visit: Payer: Self-pay | Admitting: Cardiovascular Disease

## 2015-09-29 ENCOUNTER — Other Ambulatory Visit: Payer: Self-pay

## 2015-09-29 ENCOUNTER — Telehealth: Payer: Self-pay | Admitting: Family Medicine

## 2015-09-29 MED ORDER — OMEPRAZOLE 20 MG PO CPDR: 40.0000 mg | DELAYED_RELEASE_CAPSULE | Freq: Every day | ORAL | Status: DC

## 2015-09-29 NOTE — Telephone Encounter (Signed)
Rcvd refill request for Omeprazole 20 mg #60

## 2015-09-29 NOTE — Telephone Encounter (Signed)
Med sent in.

## 2015-10-13 ENCOUNTER — Telehealth: Payer: Self-pay | Admitting: Internal Medicine

## 2015-10-13 MED ORDER — ROSUVASTATIN CALCIUM 40 MG PO TABS: 40.0000 mg | ORAL_TABLET | Freq: Every day | ORAL | Status: DC

## 2015-10-13 NOTE — Telephone Encounter (Signed)
Refill request for crestor 40mg 

## 2015-10-30 DIAGNOSIS — M654 Radial styloid tenosynovitis [de Quervain]: Secondary | ICD-10-CM | POA: Diagnosis not present

## 2015-10-30 DIAGNOSIS — M25531 Pain in right wrist: Secondary | ICD-10-CM | POA: Diagnosis not present

## 2015-10-30 DIAGNOSIS — M25532 Pain in left wrist: Secondary | ICD-10-CM | POA: Diagnosis not present

## 2015-11-02 ENCOUNTER — Telehealth: Payer: Self-pay

## 2015-11-02 MED ORDER — METOPROLOL SUCCINATE ER 50 MG PO TB24: ORAL_TABLET | ORAL | Status: DC

## 2015-11-02 NOTE — Telephone Encounter (Signed)
Fax rcvd requesting refill of metoprolol #90 to [place].

## 2015-11-02 NOTE — Telephone Encounter (Signed)
done

## 2015-11-13 ENCOUNTER — Encounter: Payer: Self-pay | Admitting: Cardiovascular Disease

## 2015-11-13 ENCOUNTER — Ambulatory Visit (INDEPENDENT_AMBULATORY_CARE_PROVIDER_SITE_OTHER): Payer: Medicare Other | Admitting: Cardiovascular Disease

## 2015-11-13 VITALS — BP 92/64 | HR 60 | Ht 64.0 in | Wt 134.4 lb

## 2015-11-13 DIAGNOSIS — I25119 Atherosclerotic heart disease of native coronary artery with unspecified angina pectoris: Secondary | ICD-10-CM

## 2015-11-13 DIAGNOSIS — G5602 Carpal tunnel syndrome, left upper limb: Secondary | ICD-10-CM | POA: Diagnosis not present

## 2015-11-13 DIAGNOSIS — I779 Disorder of arteries and arterioles, unspecified: Secondary | ICD-10-CM

## 2015-11-13 DIAGNOSIS — I739 Peripheral vascular disease, unspecified: Principal | ICD-10-CM

## 2015-11-13 DIAGNOSIS — I350 Nonrheumatic aortic (valve) stenosis: Secondary | ICD-10-CM | POA: Diagnosis not present

## 2015-11-13 DIAGNOSIS — S62001A Unspecified fracture of navicular [scaphoid] bone of right wrist, initial encounter for closed fracture: Secondary | ICD-10-CM | POA: Diagnosis not present

## 2015-11-13 DIAGNOSIS — I480 Paroxysmal atrial fibrillation: Secondary | ICD-10-CM | POA: Diagnosis not present

## 2015-11-13 NOTE — Progress Notes (Signed)
Chief Complaint  Patient presents with  . Follow-up    6 months      History of Present Illness: 82 yo female with history of CAD status post bypass surgery in September 2006 (Dr. Prescott Gum) as well as a Cox-Maze procedure for atrial fibrillation, CVA, carotid artery stenosis s/p R CEA, fibromyalgia, GERD, hiatal hernia, severe kyphoscoloisis, moderate MR, moderately severe AS here today for cardiac follow up. Myoview 8/10: EF 70% small lateral infarct no ischemia. I saw her in February 2013 and she had c/o occasional chest pains which were atypical. Carotid artery dopplers April 2013 with 40-59% bilateral stenosis with stable RCEA site (she has refused repeat testing). She was seen in the ED on 11/12/11 and had negative cardiac markers and no EKG changes. Admitted to [facility] 07/30/12 with lateral wall rib pain. Cardiac markers negative. Last echo September 2014 with normal LV function, moderately severe AS with mean gradient 28 mmHg, mild MR. She has seen Dr. Collene Mares with GI and had a swallowing study for "choking sensation". She has seen an ENT as well. Admitted to [facility] December 2015 with pneumonia and atrial fib with RVR. Her Toprol and Diltiazem were increased for rate control. Her Plavix was stopped and she was placed on Eliquis with plans for an OP cardioversion. After discharge she developed gross hematuria and stopped the Eliquis and stated that she was not interested in going back on anticoagulation. She was started back on Plavix. She was admitted to [facility] 10/23/2014 for chest pain. Stress myoview was negative for ischemia. She was in sinus during her hospitalization. Echo 10/23/14 with moderate to severe AS, LVEF 55-60% with normal wall motion. G2 diastolic dysfunction, moderate TR. She was seen in our office 11/12/14 by Cecilie Kicks, NP and was doing well.   She is here today for follow up. She is a very poor historian. She is feeling well overall. She has stable dyspnea with exertion. She  has rare chest pains and these always resolved with NTG. These only last for a few seconds. No syncope. No lower ext edema. She does not wish to start anti-coagulation. She has chronic back pain, chronic diffuse joint aches. She refuses repeat echo or carotid dopplers.   Primary Care Physician: Wyatt Haste, MD   Past Medical History  Diagnosis Date  . Arthritis   . Asthma   . GERD (gastroesophageal reflux disease)   . Hyperlipidemia   . Myocardial infarction [facility]) 2003    Stent to CFX  . Aspirin allergy     on Plavix  . Hypertension   . Eczema   . ASHD (arteriosclerotic heart disease)   . CAD (coronary artery disease) 06/2001    CABG '06  . Bronchitis   . Stroke ([place])   . Atrial fibrillation ([place]) 04/2014    declined anticoagulation     Past Surgical History  Procedure Laterality Date  . Abdominal hysterectomy  1976  . Carotid endarterectomy  2010  . Coronary artery bypass graft  01/2005    LIMA-D1; SVG-LAD; SVG-OM; SVG-PDA  . Tonsillectomy    . Cardiac surgery  stents  . Tumor removal      Current Outpatient Prescriptions  Medication Sig Dispense Refill  . albuterol (PROVENTIL HFA;VENTOLIN HFA) 108 (90 BASE) MCG/ACT inhaler Inhale 2 puffs into the lungs every 6 (six) hours as needed for wheezing or shortness of breath. 1 Inhaler 3  . calcium-vitamin D (OSCAL WITH D) 500-200 MG-UNIT per tablet Take 1 tablet by mouth daily  with breakfast.    . cholecalciferol (VITAMIN D) 1000 UNITS tablet Take 1,000 Units by mouth daily.    . clopidogrel (PLAVIX) 75 MG tablet TAKE 1 TABLET BY MOUTH DAILY 30 tablet 5  . diazepam (VALIUM) 2 MG tablet Take 2 mg by mouth every 6 (six) hours as needed (dizziness).    Marland Kitchen diltiazem (CARDIZEM CD) 180 MG 24 hr capsule take 1 capsule by mouth once daily 30 capsule 3  . fluticasone (FLONASE) 50 MCG/ACT nasal spray instill 2 sprays into each nostril once daily 16 g 3  . fluticasone (FLOVENT HFA) 110 MCG/ACT inhaler inhale 1 puff by mouth  twice a day 12 g 11  . guaiFENesin (MUCINEX) 600 MG 12 hr tablet Take 1 tablet (600 mg total) by mouth 2 (two) times daily. 30 tablet 0  . guaiFENesin (ROBITUSSIN) 100 MG/5ML liquid Take 100 mg by mouth 3 (three) times daily as needed for cough.    . isosorbide dinitrate (ISORDIL) 30 MG tablet Take 1 tablet (30 mg total) by mouth 2 (two) times daily. 60 tablet 11  . loratadine (CLARITIN) 10 MG tablet Take 10 mg by mouth daily.      . metoprolol succinate (TOPROL-XL) 50 MG 24 hr tablet take 1 tablet by mouth once daily WITH BREAKFAST TAKE WITH OR IMMEDIATELY FOLLOWING A MEAL 90 tablet 0  . nitroGLYCERIN (NITROSTAT) 0.4 MG SL tablet PLACE 1 TABLET UNDER THE TONGUE EVERY 5 MINUTES AS NEEDED FOR CHEST PAIN( MAX 3 TABLETS) 100 tablet 1  . omeprazole (PRILOSEC) 20 MG capsule Take 2 capsules (40 mg total) by mouth daily. 60 capsule 5  . rosuvastatin (CRESTOR) 40 MG tablet Take 1 tablet (40 mg total) by mouth daily. 90 tablet 1  . traMADol (ULTRAM) 50 MG tablet take 2 tablets by mouth three times a day if needed for pain 60 tablet 3  . valsartan (DIOVAN) 40 MG tablet take 1 tablet by mouth once daily 90 tablet 1   No current facility-administered medications for this visit.    Allergies  Allergen Reactions  . Bactrim [Sulfamethoxazole-Trimethoprim] Other (See Comments)    Makes her very strange   . Amitriptyline Hcl Rash  . Aspirin Swelling and Rash  . Zetia [Ezetimibe] Rash    Social History   Social History  . Marital Status: Widowed    Spouse Name: N/A  . Number of Children: 3  . Years of Education: N/A   Occupational History  .     Social History Main Topics  . Smoking status: Never Smoker   . Smokeless tobacco: Former Systems developer    Types: Snuff    Quit date: 05/23/2001  . Alcohol Use: No  . Drug Use: No  . Sexual Activity: Not Currently   Other Topics Concern  . Not on file   Social History Narrative    Family History  Problem Relation Age of Onset  . Heart failure Mother    . Other Father     Review of Systems:  As stated in the HPI and otherwise negative.   BP 92/64 mmHg  Pulse 60  Ht 5\' 4"  (1.626 m)  Wt 134 lb 6.4 oz (60.963 kg)  BMI 23.06 kg/m2  Physical Examination: General: Well developed, well nourished, NAD HEENT: OP clear, mucus membranes moist SKIN: warm, dry. No rashes. Neuro: No focal deficits Musculoskeletal: Muscle strength 5/5 all ext Psychiatric: Mood and affect normal Neck: No JVD, no carotid bruits, no thyromegaly, no lymphadenopathy. Lungs:Clear bilaterally, no wheezes, rhonci, crackles  Cardiovascular: Regular rate and rhythm. Loud systolic murmur. No gallops or rubs. Abdomen:Soft. Bowel sounds present. Non-tender.  Extremities: No lower extremity edema. Pulses are 2 + in the bilateral DP/PT.  Echo June 2016: Left ventricle: The cavity size was normal. Wall thickness was  normal. Systolic function was normal. The estimated ejection  fraction was in the range of 55% to 60%. Wall motion was normal;  there were no regional wall motion abnormalities. Features are  consistent with a pseudonormal left ventricular filling pattern,  with concomitant abnormal relaxation and increased filling  pressure (grade 2 diastolic dysfunction). - Aortic valve: There was moderate to severe stenosis. Valve area  (VTI): 0.9 cm^2. Valve area (Vmax): 0.8 cm^2. Valve area (Vmean):  0.83 cm^2. Gradients are probably underestimated. - Mitral valve: Calcified annulus. - Tricuspid valve: There was moderate regurgitation.  EKG:  EKG is ordered today. The ekg ordered today demonstrates Sinus, rate 60 bpm. 1st degree AV block  Recent Labs: 08/21/2015: ALT 10; BUN 15; Creat 0.41*; Hemoglobin 12.2; Platelets 198; Potassium 4.1; Sodium 140   Lipid Panel    Component Value Date/Time   CHOL 144 08/21/2015 0001   TRIG 87 08/21/2015 0001   HDL 73 08/21/2015 0001   CHOLHDL 2.0 08/21/2015 0001   VLDL 17 08/21/2015 0001   LDLCALC 54 08/21/2015 0001       Wt Readings from Last 3 Encounters:  11/13/15 134 lb 6.4 oz (60.963 kg)  08/21/15 134 lb (60.782 kg)  02/20/15 136 lb (61.689 kg)     Other studies Reviewed: Additional studies/ records that were reviewed today include: . Review of the above records demonstrates:    Assessment and Plan:   1. Carotid artery disease: Stable moderate bilateral disease with patent right CEA site and 40-59% bilateral ICA stenosis by dopplers October 2015. She refuses repeat dopplers now.   2. Mitral regurgitation: Trivial by echo June 2016. Will folllow.   3. CAD: She is s/p bypass and doing well. She has chronic, stable angina. I have instructed her to let me know if her chest pain changes in severity or frequency. Continue current meds. She is intolerant to ASA but takes clopidogrel daily.   4. AORTIC STENOSIS: Moderately severe by echo June 2016. She would not be a candidate for open surgical replacement. She could be considered for TAVR in the future. She refuses repeat echo now.   5. Atrial fibrillation: Sinus today. She does not wish to consider anti-coagulation due to prior bleeding. Will continue beta blocker and Cardizem.   Current medicines are reviewed at length with the patient today.  The patient does not have concerns regarding medicines.  The following changes have been made:  no change  Labs/ tests ordered today include:  No orders of the defined types were placed in this encounter.     Disposition:   FU with me in 6 months   Signed, Lauree Chandler, MD 11/13/2015 3:21 PM    [facility] [place], [place], [place]  [address] Phone: [phone]; Fax: [phone]

## 2015-11-13 NOTE — Patient Instructions (Signed)

## 2015-11-26 DIAGNOSIS — S62001A Unspecified fracture of navicular [scaphoid] bone of right wrist, initial encounter for closed fracture: Secondary | ICD-10-CM | POA: Diagnosis not present

## 2015-11-26 DIAGNOSIS — M25531 Pain in right wrist: Secondary | ICD-10-CM | POA: Diagnosis not present

## 2015-11-26 DIAGNOSIS — M6281 Muscle weakness (generalized): Secondary | ICD-10-CM | POA: Diagnosis not present

## 2015-11-30 ENCOUNTER — Emergency Department (HOSPITAL_COMMUNITY): Payer: Medicare Other

## 2015-11-30 ENCOUNTER — Observation Stay (HOSPITAL_COMMUNITY)
Admission: EM | Admit: 2015-11-30 | Discharge: 2015-12-03 | Disposition: A | Discharge status: Home / Self Care | Payer: Medicare Other | Attending: Internal Medicine | Admitting: Internal Medicine

## 2015-11-30 ENCOUNTER — Encounter (HOSPITAL_COMMUNITY): Payer: Self-pay | Admitting: *Deleted

## 2015-11-30 DIAGNOSIS — I959 Hypotension, unspecified: Secondary | ICD-10-CM | POA: Diagnosis not present

## 2015-11-30 DIAGNOSIS — Z951 Presence of aortocoronary bypass graft: Secondary | ICD-10-CM | POA: Diagnosis not present

## 2015-11-30 DIAGNOSIS — Z87891 Personal history of nicotine dependence: Secondary | ICD-10-CM | POA: Insufficient documentation

## 2015-11-30 DIAGNOSIS — Z7902 Long term (current) use of antithrombotics/antiplatelets: Secondary | ICD-10-CM | POA: Diagnosis not present

## 2015-11-30 DIAGNOSIS — J449 Chronic obstructive pulmonary disease, unspecified: Secondary | ICD-10-CM | POA: Insufficient documentation

## 2015-11-30 DIAGNOSIS — I350 Nonrheumatic aortic (valve) stenosis: Secondary | ICD-10-CM | POA: Insufficient documentation

## 2015-11-30 DIAGNOSIS — J45909 Unspecified asthma, uncomplicated: Secondary | ICD-10-CM | POA: Diagnosis present

## 2015-11-30 DIAGNOSIS — Z79899 Other long term (current) drug therapy: Secondary | ICD-10-CM | POA: Insufficient documentation

## 2015-11-30 DIAGNOSIS — Z886 Allergy status to analgesic agent status: Secondary | ICD-10-CM | POA: Diagnosis not present

## 2015-11-30 DIAGNOSIS — Z955 Presence of coronary angioplasty implant and graft: Secondary | ICD-10-CM | POA: Insufficient documentation

## 2015-11-30 DIAGNOSIS — R001 Bradycardia, unspecified: Secondary | ICD-10-CM | POA: Insufficient documentation

## 2015-11-30 DIAGNOSIS — J4540 Moderate persistent asthma, uncomplicated: Secondary | ICD-10-CM | POA: Diagnosis not present

## 2015-11-30 DIAGNOSIS — M5489 Other dorsalgia: Secondary | ICD-10-CM | POA: Diagnosis not present

## 2015-11-30 DIAGNOSIS — K219 Gastro-esophageal reflux disease without esophagitis: Secondary | ICD-10-CM | POA: Insufficient documentation

## 2015-11-30 DIAGNOSIS — I480 Paroxysmal atrial fibrillation: Secondary | ICD-10-CM | POA: Diagnosis not present

## 2015-11-30 DIAGNOSIS — Z8673 Personal history of transient ischemic attack (TIA), and cerebral infarction without residual deficits: Secondary | ICD-10-CM | POA: Insufficient documentation

## 2015-11-30 DIAGNOSIS — M25512 Pain in left shoulder: Secondary | ICD-10-CM | POA: Diagnosis not present

## 2015-11-30 DIAGNOSIS — I252 Old myocardial infarction: Secondary | ICD-10-CM | POA: Insufficient documentation

## 2015-11-30 DIAGNOSIS — R079 Chest pain, unspecified: Secondary | ICD-10-CM | POA: Diagnosis not present

## 2015-11-30 DIAGNOSIS — I739 Peripheral vascular disease, unspecified: Secondary | ICD-10-CM

## 2015-11-30 DIAGNOSIS — Z7951 Long term (current) use of inhaled steroids: Secondary | ICD-10-CM | POA: Insufficient documentation

## 2015-11-30 DIAGNOSIS — I119 Hypertensive heart disease without heart failure: Secondary | ICD-10-CM | POA: Insufficient documentation

## 2015-11-30 DIAGNOSIS — E785 Hyperlipidemia, unspecified: Secondary | ICD-10-CM | POA: Insufficient documentation

## 2015-11-30 DIAGNOSIS — I779 Disorder of arteries and arterioles, unspecified: Secondary | ICD-10-CM | POA: Diagnosis present

## 2015-11-30 DIAGNOSIS — M549 Dorsalgia, unspecified: Secondary | ICD-10-CM | POA: Diagnosis present

## 2015-11-30 DIAGNOSIS — R0602 Shortness of breath: Secondary | ICD-10-CM | POA: Diagnosis not present

## 2015-11-30 DIAGNOSIS — I25118 Atherosclerotic heart disease of native coronary artery with other forms of angina pectoris: Secondary | ICD-10-CM

## 2015-11-30 DIAGNOSIS — Z888 Allergy status to other drugs, medicaments and biological substances status: Secondary | ICD-10-CM

## 2015-11-30 HISTORY — DX: Unspecified malignant neoplasm of skin of unspecified upper limb, including shoulder: C44.601

## 2015-11-30 HISTORY — DX: Pneumonia, unspecified organism: J18.9

## 2015-11-30 HISTORY — DX: Cardiac murmur, unspecified: R01.1

## 2015-11-30 LAB — CBC
HCT: 37.9 % (ref 36.0–46.0)
Hemoglobin: 11.9 g/dL — ABNORMAL LOW (ref 12.0–15.0)
MCH: 29.6 pg (ref 26.0–34.0)
MCHC: 31.4 g/dL (ref 30.0–36.0)
MCV: 94.3 fL (ref 78.0–100.0)
Platelets: 188 10*3/uL (ref 150–400)
RBC: 4.02 MIL/uL (ref 3.87–5.11)
RDW: 14 % (ref 11.5–15.5)
WBC: 6.3 10*3/uL (ref 4.0–10.5)

## 2015-11-30 LAB — BASIC METABOLIC PANEL
Anion gap: 8 (ref 5–15)
BUN: 15 mg/dL (ref 6–20)
CALCIUM: 9.6 mg/dL (ref 8.9–10.3)
CO2: 24 mmol/L (ref 22–32)
Chloride: 108 mmol/L (ref 101–111)
Creatinine, Ser: 0.61 mg/dL (ref 0.44–1.00)
GFR calc Af Amer: >60 mL/min (ref >60)
GLUCOSE: 126 mg/dL — AB (ref 65–99)
Potassium: 4 mmol/L (ref 3.5–5.1)
Sodium: 140 mmol/L (ref 135–145)

## 2015-11-30 LAB — I-STAT TROPONIN, ED: TROPONIN I, POC: 0 ng/mL (ref 0.00–0.08)

## 2015-11-30 NOTE — ED Provider Notes (Signed)
CSN: [id number]     Arrival date & time 11/30/15  1719 History   First MD Initiated Contact with Patient 11/30/15 2146     Chief Complaint  Patient presents with  . Chest Pain     (Consider location/radiation/quality/duration/timing/severity/associated sxs/prior Treatment) HPI   Blood pressure 118/56, pulse 62, temperature 98.1 F (36.7 C), temperature source Oral, resp. rate 18, SpO2 99 %.  [name] is a 82 y.o. female complaining of left-sided posterior shoulder pain acute onset at 3:30 PM like someone hit her associated with nausea and shortness of breath, patient denies anterior chest pain (note that this contradicts triage note) ports associated symptoms of feeling "out of it", states she does not feel like she was going to pass out. States that she took 3 sublingual nitroglycerin with improvement of her pain, now completely resolved. Patient has history of A. fib, she's not anticoagulated, she takes Plavix, she has anaphylactic reaction to aspirin. Patient has COPD, states that cough and sputum production is at her baseline, she denies increasing peripheral edema, orthopnea, PND, history of DVT or PE.  Cardiology: Julianne Handler   Past Medical History  Diagnosis Date  . Arthritis   . Asthma   . GERD (gastroesophageal reflux disease)   . Hyperlipidemia   . Myocardial infarction [facility]) 2003    Stent to CFX  . Aspirin allergy     on Plavix  . Hypertension   . Eczema   . ASHD (arteriosclerotic heart disease)   . CAD (coronary artery disease) 06/2001    CABG '06  . Bronchitis   . Stroke ([place])   . Atrial fibrillation ([place]) 04/2014    declined anticoagulation    Past Surgical History  Procedure Laterality Date  . Abdominal hysterectomy  1976  . Carotid endarterectomy  2010  . Coronary artery bypass graft  01/2005    LIMA-D1; SVG-LAD; SVG-OM; SVG-PDA  . Tonsillectomy    . Cardiac surgery  stents  . Tumor removal     Family History  Problem Relation Age of Onset  .  Heart failure Mother   . Other Father    Social History  Substance Use Topics  . Smoking status: Never Smoker   . Smokeless tobacco: Former Systems developer    Types: Snuff    Quit date: 05/23/2001  . Alcohol Use: No   OB History    No data available     Review of Systems  10 systems reviewed and found to be negative, except as noted in the HPI.  Allergies  Bactrim; Amitriptyline hcl; Aspirin; and Zetia  Home Medications   Prior to Admission medications   Medication Sig Start Date End Date Taking? Authorizing Provider  albuterol (PROVENTIL HFA;VENTOLIN HFA) 108 (90 BASE) MCG/ACT inhaler Inhale 2 puffs into the lungs every 6 (six) hours as needed for wheezing or shortness of breath. 04/09/15  Yes Denita Lung, MD  calcium-vitamin D (OSCAL WITH D) 500-200 MG-UNIT per tablet Take 1 tablet by mouth daily with breakfast.   Yes Historical Provider, MD  cholecalciferol (VITAMIN D) 1000 UNITS tablet Take 1,000 Units by mouth daily.   Yes Historical Provider, MD  clopidogrel (PLAVIX) 75 MG tablet TAKE 1 TABLET BY MOUTH DAILY 08/12/15  Yes Burnell Blanks, MD  diazepam (VALIUM) 2 MG tablet Take 2 mg by mouth every 6 (six) hours as needed (dizziness). 06/04/13  Yes Denita Lung, MD  diltiazem (CARDIZEM CD) 180 MG 24 hr capsule take 1 capsule by mouth once daily 09/29/15  Yes Burnell Blanks, MD  fluticasone [facility]) 50 MCG/ACT nasal spray instill 2 sprays into each nostril once daily 09/01/15  Yes Denita Lung, MD  fluticasone [facility] HFA) 110 MCG/ACT inhaler inhale 1 puff by mouth twice a day 08/17/15  Yes Denita Lung, MD  guaiFENesin (MUCINEX) 600 MG 12 hr tablet Take 1 tablet (600 mg total) by mouth 2 (two) times daily. 05/25/14  Yes Belkys A Regalado, MD  guaiFENesin (ROBITUSSIN) 100 MG/5ML liquid Take 100 mg by mouth 3 (three) times daily as needed for cough.   Yes Historical Provider, MD  isosorbide dinitrate (ISORDIL) 30 MG tablet Take 1 tablet (30 mg total) by mouth 2 (two)  times daily. 09/09/15  Yes Burnell Blanks, MD  loratadine (CLARITIN) 10 MG tablet Take 10 mg by mouth daily.     Yes Historical Provider, MD  metoprolol succinate (TOPROL-XL) 50 MG 24 hr tablet take 1 tablet by mouth once daily WITH BREAKFAST TAKE WITH OR IMMEDIATELY FOLLOWING A MEAL 11/02/15  Yes Denita Lung, MD  nitroGLYCERIN (NITROSTAT) 0.4 MG SL tablet PLACE 1 TABLET UNDER THE TONGUE EVERY 5 MINUTES AS NEEDED FOR CHEST PAIN( MAX 3 TABLETS) 07/31/15  Yes Burnell Blanks, MD  omeprazole (PRILOSEC) 20 MG capsule Take 2 capsules (40 mg total) by mouth daily. 09/29/15  Yes Denita Lung, MD  rosuvastatin (CRESTOR) 40 MG tablet Take 1 tablet (40 mg total) by mouth daily. 10/13/15  Yes Denita Lung, MD  traMADol Veatrice Bourbon) 50 MG tablet take 2 tablets by mouth three times a day if needed for pain 08/27/15  Yes Denita Lung, MD  valsartan (DIOVAN) 40 MG tablet take 1 tablet by mouth once daily 09/01/15  Yes Burnell Blanks, MD   BP 149/60 mmHg  Pulse 63  Temp(Src) 98.1 F (36.7 C) (Oral)  Resp 21  SpO2 96% Physical Exam  Constitutional: She is oriented to person, place, and time. She appears well-developed and well-nourished. No distress.  HENT:  Head: Normocephalic and atraumatic.  Mouth/Throat: Oropharynx is clear and moist.  Eyes: Conjunctivae and EOM are normal.  Cardiovascular: Normal rate.   Murmur heard. Systolic murmur, irregular rhythm.  Pulmonary/Chest: Effort normal and breath sounds normal. No stridor. No respiratory distress. She has no wheezes. She has no rales. She exhibits no tenderness.  Abdominal: Soft.  Musculoskeletal: Normal range of motion.  Neurological: She is alert and oriented to person, place, and time.  Psychiatric: She has a normal mood and affect.  Nursing note and vitals reviewed.   ED Course  Procedures (including critical care time) Labs Review Labs Reviewed  BASIC METABOLIC PANEL - Abnormal; Notable for the following:    Glucose,  Bld 126 (*)    All other components within normal limits  CBC - Abnormal; Notable for the following:    Hemoglobin 11.9 (*)    All other components within normal limits  I-STAT TROPOININ, ED    Imaging Review Dg Chest 2 View  11/30/2015  CLINICAL DATA:  Chest pain and shortness of breath for 1 day. Initial encounter. EXAM: CHEST  2 VIEW COMPARISON:  PA and lateral chest 10/23/2014. FINDINGS: The patient is status post CABG. Heart size is upper normal. Calcified granuloma right lower lobe is unchanged. No pneumothorax or pleural effusion. Scoliosis is again seen. IMPRESSION: No acute disease.  Stable compared to prior exam. Electronically Signed   By: Inge Rise M.D.   On: 11/30/2015 18:39   I have personally reviewed and  evaluated these images and lab results as part of my medical decision-making.   EKG Interpretation   Date/Time:  Monday November 30 2015 17:29:27 EDT Ventricular Rate:  54 PR Interval:    QRS Duration: 88 QT Interval:  434 QTC Calculation: 411 R Axis:   77 Text Interpretation:  Atrial fibrillation with slow ventricular response  Abnormal ECG Confirmed by Alvino Chapel  MD, NATHAN [phone]) on 11/30/2015  11:05:53 PM      MDM   Final diagnoses:  SOB (shortness of breath)  Left shoulder pain    Filed Vitals:   11/30/15 2315 11/30/15 2330 12/01/15 0000 12/01/15 0015  BP: 132/60 131/58 141/60 149/60  Pulse: 59 64 60 63  Temp:      TempSrc:      Resp: 19 22 20 21   SpO2: 97% 95% 97% 96%   [name] is 82 y.o. female presenting with Left posterior shoulder pain with associated shortness of breath, acute onset alleviated by 3 sublingual nitroglycerin. Patient states that this feels similar to prior MI just not as severe. EKG with atrial fibrillation, troponin negative. Given her extensive cardiac history think she would benefit from a chest pain admission. Patient has anaphylactic reaction to aspirin, she taking Plavix.  Discussed with [facility] hospitalist Dr.  Harlin Heys who accepts admission.  Monico Blitz, PA-C 12/01/15 0033  Davonna Belling, MD 12/01/15 [phone]

## 2015-11-30 NOTE — H&P (Signed)
[name] [id number] DOB: [birth year] DOA: 11/30/2015     PCP: Wyatt Haste, MD   Outpatient Specialists: [facility]   Patient coming from: home Lives  With family   Chief Complaint: back pain associated shortness of breath  HPI: [name] is a 82 y.o. female with medical history significant of CAD status post bypass surgery in September 2006 (Dr. Prescott Gum) as well as a Cox-Maze procedure for atrial fibrillation, CVA, carotid artery stenosis s/p R CEA, fibromyalgia, GERD, hiatal hernia, severe kyphoscoloisis, moderate MR, moderately severe AS     Presented with Back pain similar to prior MI but much milder. It started around 3 PM she just finished washing dishes. This was associated with mild shortness of breath, some nausea. No vomiting. Pain  Did not radiate. She took 3 Nitro's and her pain improved by the time she presented to ER she was pain free.  Denies any fever no chills. Pain was not changed by breathing or position.   Regarding pertinent Chronic problems: Regarding  History of coronary artery disease Myoview 8/10: EF 70% small lateral infarct no ischemia, REPEAT 20016 NO ISCHEMIA. RRECURRENT ADMISSIONS FOR CHEST PAIN Regarding carotid aRTERY DISEASE Carotid artery dopplers April 2013 with 40-59% bilateral stenosis with stable RCEA site   IN ER: Afebrile heart rate 63 questionable transiently 40 a symptomatic blood pressure 149/60 WBC 6.3 hemoglobin 11.9 creatinine 0.61 trop 0.0 Chest x-ray nonacute     Hospitalist was called for admission for back pain possible anginal equivalent  Review of Systems:    Pertinent positives include: back pain, shortness of breath at rest.  Constitutional:  No weight loss, night sweats, Fevers, chills, fatigue, weight loss  HEENT:  No headaches, Difficulty swallowing,Tooth/dental problems,Sore throat,  No sneezing, itching, ear ache, nasal congestion, post nasal drip,  Cardio-vascular:  No  Orthopnea,  PND, anasarca, dizziness, palpitations.no Bilateral lower extremity swelling  GI:  No heartburn, indigestion, abdominal pain, nausea, vomiting, diarrhea, change in bowel habits, loss of appetite, melena, blood in stool, hematemesis Resp:  no  No dyspnea on exertion, No excess mucus, no productive cough, No non-productive cough, No coughing up of blood.No change in color of mucus.No wheezing. Skin:  no rash or lesions. No jaundice GU:  no dysuria, change in color of urine, no urgency or frequency. No straining to urinate.  No flank pain.  Musculoskeletal:  No joint pain or no joint swelling. No decreased range of motion. No back pain.  Psych:  No change in mood or affect. No depression or anxiety. No memory loss.  Neuro: no localizing neurological complaints, no tingling, no weakness, no double vision, no gait abnormality, no slurred speech, no confusion  As per HPI otherwise 10 point review of systems negative.   Past Medical History: Past Medical History  Diagnosis Date  . Arthritis   . Asthma   . GERD (gastroesophageal reflux disease)   . Hyperlipidemia   . Myocardial infarction [facility]) 2003    Stent to CFX  . Aspirin allergy     on Plavix  . Hypertension   . Eczema   . ASHD (arteriosclerotic heart disease)   . CAD (coronary artery disease) 06/2001    CABG '06  . Bronchitis   . Stroke ([place])   . Atrial fibrillation ([place]) 04/2014    declined anticoagulation    Past Surgical History  Procedure Laterality Date  . Abdominal hysterectomy  1976  . Carotid endarterectomy  2010  . Coronary artery bypass graft  01/2005    LIMA-D1; SVG-LAD; SVG-OM; SVG-PDA  . Tonsillectomy    . Cardiac surgery  stents  . Tumor removal       Social History:  Ambulatory  walker or wheelchair bound     reports that she has never smoked. She quit smokeless tobacco use about 14 years ago. Her smokeless tobacco use included Snuff. She reports that she does not drink alcohol or use illicit  drugs.  Allergies:   Allergies  Allergen Reactions  . Bactrim [Sulfamethoxazole-Trimethoprim] Other (See Comments)    Makes her very strange   . Amitriptyline Hcl Rash  . Aspirin Swelling and Rash  . Zetia [Ezetimibe] Rash       Family History:    Family History  Problem Relation Age of Onset  . Heart failure Mother   . Other Father     Medications: Prior to Admission medications   Medication Sig Start Date End Date Taking? Authorizing Provider  albuterol (PROVENTIL HFA;VENTOLIN HFA) 108 (90 BASE) MCG/ACT inhaler Inhale 2 puffs into the lungs every 6 (six) hours as needed for wheezing or shortness of breath. 04/09/15  Yes Denita Lung, MD  calcium-vitamin D (OSCAL WITH D) 500-200 MG-UNIT per tablet Take 1 tablet by mouth daily with breakfast.   Yes Historical Provider, MD  cholecalciferol (VITAMIN D) 1000 UNITS tablet Take 1,000 Units by mouth daily.   Yes Historical Provider, MD  clopidogrel (PLAVIX) 75 MG tablet TAKE 1 TABLET BY MOUTH DAILY 08/12/15  Yes Burnell Blanks, MD  diazepam (VALIUM) 2 MG tablet Take 2 mg by mouth every 6 (six) hours as needed (dizziness). 06/04/13  Yes Denita Lung, MD  diltiazem (CARDIZEM CD) 180 MG 24 hr capsule take 1 capsule by mouth once daily 09/29/15  Yes Burnell Blanks, MD  fluticasone [facility]) 50 MCG/ACT nasal spray instill 2 sprays into each nostril once daily 09/01/15  Yes Denita Lung, MD  fluticasone [facility] HFA) 110 MCG/ACT inhaler inhale 1 puff by mouth twice a day 08/17/15  Yes Denita Lung, MD  guaiFENesin (MUCINEX) 600 MG 12 hr tablet Take 1 tablet (600 mg total) by mouth 2 (two) times daily. 05/25/14  Yes Belkys A Regalado, MD  guaiFENesin (ROBITUSSIN) 100 MG/5ML liquid Take 100 mg by mouth 3 (three) times daily as needed for cough.   Yes Historical Provider, MD  isosorbide dinitrate (ISORDIL) 30 MG tablet Take 1 tablet (30 mg total) by mouth 2 (two) times daily. 09/09/15  Yes Burnell Blanks, MD  loratadine  (CLARITIN) 10 MG tablet Take 10 mg by mouth daily.     Yes Historical Provider, MD  metoprolol succinate (TOPROL-XL) 50 MG 24 hr tablet take 1 tablet by mouth once daily WITH BREAKFAST TAKE WITH OR IMMEDIATELY FOLLOWING A MEAL 11/02/15  Yes Denita Lung, MD  nitroGLYCERIN (NITROSTAT) 0.4 MG SL tablet PLACE 1 TABLET UNDER THE TONGUE EVERY 5 MINUTES AS NEEDED FOR CHEST PAIN( MAX 3 TABLETS) 07/31/15  Yes Burnell Blanks, MD  omeprazole (PRILOSEC) 20 MG capsule Take 2 capsules (40 mg total) by mouth daily. 09/29/15  Yes Denita Lung, MD  rosuvastatin (CRESTOR) 40 MG tablet Take 1 tablet (40 mg total) by mouth daily. 10/13/15  Yes Denita Lung, MD  traMADol Veatrice Bourbon) 50 MG tablet take 2 tablets by mouth three times a day if needed for pain 08/27/15  Yes Denita Lung, MD  valsartan (DIOVAN) 40 MG tablet take 1 tablet by mouth once daily 09/01/15  Yes Burnell Blanks, MD    Physical Exam: Patient Vitals for the past 24 hrs:  BP Temp Temp src Pulse Resp SpO2  11/30/15 2330 131/58 mmHg - - 64 22 95 %  11/30/15 2315 132/60 mmHg - - (!) 59 19 97 %  11/30/15 2230 134/60 mmHg - - 62 19 96 %  11/30/15 2215 107/93 mmHg - - 61 18 95 %  11/30/15 2200 (!) 141/121 mmHg - - 64 17 96 %  11/30/15 2051 118/56 mmHg - - 62 18 99 %  11/30/15 1726 (!) 121/50 mmHg 98.1 F (36.7 C) Oral (!) 40 18 99 %    1. General:  in No Acute distress 2. Psychological: Alert and  Oriented 3. Head/ENT:   Moist   Mucous Membranes                          Head Non traumatic, neck supple                            Poor Dentition 4. SKIN:   decreased Skin turgor,  Skin clean Dry and intact no rash 5. Heart: Regular rate and rhythm Present systolic Murmur, no Rub or gallop 6. Lungs:   no wheezes or crackles   7. Abdomen: Soft, non-tender, Non distended 8. Lower extremities: no clubbing, cyanosis, or edema 9. Neurologically Grossly intact, moving all 4 extremities equally, back pain reproducible by palpation 10. MSK:  Normal range of motion   body mass index is unknown because there is no weight on file.  Labs on Admission:   Labs on Admission: I have personally reviewed following labs and imaging studies  CBC:  Recent Labs Lab 11/30/15 1730  WBC 6.3  HGB 11.9*  HCT 37.9  MCV 94.3  PLT 0000000   Basic Metabolic Panel:  Recent Labs Lab 11/30/15 1730  NA 140  K 4.0  CL 108  CO2 24  GLUCOSE 126*  BUN 15  CREATININE 0.61  CALCIUM 9.6   GFR: CrCl cannot be calculated (Unknown ideal weight.). Liver Function Tests: No results for input(s): AST, ALT, ALKPHOS, BILITOT, PROT, ALBUMIN in the last 168 hours. No results for input(s): LIPASE, AMYLASE in the last 168 hours. No results for input(s): AMMONIA in the last 168 hours. Coagulation Profile: No results for input(s): INR, PROTIME in the last 168 hours. Cardiac Enzymes: No results for input(s): CKTOTAL, CKMB, CKMBINDEX, TROPONINI in the last 168 hours. BNP (last 3 results) No results for input(s): PROBNP in the last 8760 hours. HbA1C: No results for input(s): HGBA1C in the last 72 hours. CBG: No results for input(s): GLUCAP in the last 168 hours. Lipid Profile: No results for input(s): CHOL, HDL, LDLCALC, TRIG, CHOLHDL, LDLDIRECT in the last 72 hours. Thyroid Function Tests: No results for input(s): TSH, T4TOTAL, FREET4, T3FREE, THYROIDAB in the last 72 hours. Anemia Panel: No results for input(s): VITAMINB12, FOLATE, FERRITIN, TIBC, IRON, RETICCTPCT in the last 72 hours. Urine analysis:    Component Value Date/Time   COLORURINE YELLOW 05/19/2014 1601   APPEARANCEUR CLEAR 05/19/2014 1601   LABSPEC 1.013 05/19/2014 1601   PHURINE 5.0 05/19/2014 1601   GLUCOSEU NEGATIVE 05/19/2014 1601   HGBUR NEGATIVE 05/19/2014 1601   BILIRUBINUR NEGATIVE 05/19/2014 1601   BILIRUBINUR n 12/05/2011 1627   KETONESUR NEGATIVE 05/19/2014 1601   PROTEINUR NEGATIVE 05/19/2014 1601   PROTEINUR trace 12/05/2011 1627   UROBILINOGEN 0.2 05/19/2014  1601   UROBILINOGEN negative 12/05/2011 1627   NITRITE NEGATIVE 05/19/2014 1601   NITRITE n 12/05/2011 1627   LEUKOCYTESUR NEGATIVE 05/19/2014 1601   Sepsis Labs: @LABRCNTIP (procalcitonin:4,lacticidven:4) )No results found for this or any previous visit (from the past 240 hour(s)).     UA not obtained  No results found for: HGBA1C  CrCl cannot be calculated (Unknown ideal weight.).  BNP (last 3 results) No results for input(s): PROBNP in the last 8760 hours.   ECG REPORT  Independently reviewed Rate: 54  Rhythm: Atrial fibrillation ST&T Change: No acute ischemic changes   QTC  411  There were no vitals filed for this visit.   Cultures:    Component Value Date/Time   SDES URINE, CATHETERIZED 05/19/2014 1601   SDES URINE, CLEAN CATCH 05/19/2014 1601   SPECREQUEST NONE 05/19/2014 1601   SPECREQUEST NONE 05/19/2014 1601   CULT NO GROWTH Performed at [facility]  05/19/2014 1601   REPTSTATUS 05/20/2014 FINAL 05/19/2014 1601   REPTSTATUS 05/20/2014 FINAL 05/19/2014 1601     Radiological Exams on Admission: Dg Chest 2 View  11/30/2015  CLINICAL DATA:  Chest pain and shortness of breath for 1 day. Initial encounter. EXAM: CHEST  2 VIEW COMPARISON:  PA and lateral chest 10/23/2014. FINDINGS: The patient is status post CABG. Heart size is upper normal. Calcified granuloma right lower lobe is unchanged. No pneumothorax or pleural effusion. Scoliosis is again seen. IMPRESSION: No acute disease.  Stable compared to prior exam. Electronically Signed   By: Inge Rise M.D.   On: 11/30/2015 18:39    Chart has been reviewed    Assessment/Plan 82 y.o. female with medical history significant of CAD status post bypass surgery in September 2006 (Dr. Prescott Gum) as well as a Cox-Maze procedure for atrial fibrillation, CVA, carotid artery stenosis s/p R CEA, fibromyalgia, GERD, hiatal hernia, severe kyphoscoloisis, moderate MR, moderately severe AS  Here with back pain  possibly angina equivalent.  Present on Admission:  . Back pain Given in the past patient had backache pain is anginal equivalent in a setting of a mildly will admit for telemetry observation history otherwise in exam was more consistent musculoskeletal pain reproducible by palpation. We'll treat with Robaxin, will cycle cardiac enzymes for completion  . Asthma/COPD - currently stable continue albuterol as needed  . Atrial fibrillation ([place]) - given transiently bradycardia will change metoprolol to metoprolol decrease dose and observe withholding parameters   patient has declined anticoagulation in a past CHA2DS2Vasc score - 6   Other plan as per orders.  DVT prophylaxis:    Lovenox     Code Status:  FULL CODE  as per patient    Family Communication:   Family   at  Bedside  plan of care was discussed with   Daughter [name] [phone]  Disposition Plan:      To home once workup is complete and patient is stable   Consults called: none   Admission status:    obs    Level of care     tele          I have spent a total of 56 min on this admission    Tresha Muzio 12/01/2015, 1:19 AM    [facility]  Pager [phone]   after 2 AM please page floor coverage PA If 7AM-7PM, please contact the day team taking care of the patient  [email or web address]  Password [email or web address]

## 2015-11-30 NOTE — ED Notes (Signed)
PT is here with awful pain to upper back, pt reports some chest pain and reports sob. Marland Kitchen  Pt reports she took 3 ntg in the last hour and reports it did help.

## 2015-12-01 ENCOUNTER — Encounter (HOSPITAL_COMMUNITY): Payer: Self-pay | Admitting: Internal Medicine

## 2015-12-01 DIAGNOSIS — E785 Hyperlipidemia, unspecified: Secondary | ICD-10-CM | POA: Diagnosis not present

## 2015-12-01 DIAGNOSIS — I480 Paroxysmal atrial fibrillation: Secondary | ICD-10-CM | POA: Diagnosis not present

## 2015-12-01 DIAGNOSIS — M549 Dorsalgia, unspecified: Secondary | ICD-10-CM | POA: Diagnosis present

## 2015-12-01 DIAGNOSIS — I110 Hypertensive heart disease with heart failure: Secondary | ICD-10-CM | POA: Diagnosis not present

## 2015-12-01 DIAGNOSIS — M5489 Other dorsalgia: Secondary | ICD-10-CM | POA: Diagnosis not present

## 2015-12-01 DIAGNOSIS — Z951 Presence of aortocoronary bypass graft: Secondary | ICD-10-CM | POA: Diagnosis not present

## 2015-12-01 DIAGNOSIS — J4540 Moderate persistent asthma, uncomplicated: Secondary | ICD-10-CM | POA: Diagnosis not present

## 2015-12-01 DIAGNOSIS — I779 Disorder of arteries and arterioles, unspecified: Secondary | ICD-10-CM | POA: Diagnosis present

## 2015-12-01 DIAGNOSIS — I739 Peripheral vascular disease, unspecified: Secondary | ICD-10-CM

## 2015-12-01 LAB — CREATININE, SERUM: CREATININE: 0.48 mg/dL (ref 0.44–1.00)

## 2015-12-01 LAB — TROPONIN I: Troponin I: <0.03 ng/mL (ref <0.03)

## 2015-12-01 LAB — CBC
HEMATOCRIT: 38.3 % (ref 36.0–46.0)
HEMOGLOBIN: 12.2 g/dL (ref 12.0–15.0)
MCH: 29.8 pg (ref 26.0–34.0)
MCHC: 31.9 g/dL (ref 30.0–36.0)
MCV: 93.6 fL (ref 78.0–100.0)
Platelets: 177 10*3/uL (ref 150–400)
RBC: 4.09 MIL/uL (ref 3.87–5.11)
RDW: 13.9 % (ref 11.5–15.5)
WBC: 6.3 10*3/uL (ref 4.0–10.5)

## 2015-12-01 MED ORDER — SODIUM CHLORIDE 0.9 % IV SOLN
INTRAVENOUS | Status: AC
  Administered 2015-12-01: 04:00:00 via INTRAVENOUS

## 2015-12-01 MED ORDER — CLOPIDOGREL BISULFATE 75 MG PO TABS
75.0000 mg | ORAL_TABLET | Freq: Every day | ORAL | Status: DC
  Administered 2015-12-01 – 2015-12-03 (×3): 75 mg via ORAL
  Filled 2015-12-01 (×3): qty 1

## 2015-12-01 MED ORDER — ACETAMINOPHEN 325 MG PO TABS
650.0000 mg | ORAL_TABLET | ORAL | Status: DC | PRN
Start: 2015-12-01 — End: 2015-12-03

## 2015-12-01 MED ORDER — DILTIAZEM HCL ER COATED BEADS 180 MG PO CP24
180.0000 mg | ORAL_CAPSULE | Freq: Every day | ORAL | Status: DC
  Filled 2015-12-01: qty 1

## 2015-12-01 MED ORDER — SODIUM CHLORIDE 0.9 % IV SOLN
INTRAVENOUS | Status: AC
  Administered 2015-12-01: 50 mL/h via INTRAVENOUS
  Administered 2015-12-02: 10:00:00 via INTRAVENOUS

## 2015-12-01 MED ORDER — DILTIAZEM HCL ER COATED BEADS 120 MG PO CP24
120.0000 mg | ORAL_CAPSULE | Freq: Every day | ORAL | Status: DC
Start: 2015-12-02 — End: 2015-12-01

## 2015-12-01 MED ORDER — IRBESARTAN 75 MG PO TABS
37.5000 mg | ORAL_TABLET | Freq: Every day | ORAL | Status: DC
  Administered 2015-12-01 – 2015-12-03 (×3): 37.5 mg via ORAL
  Filled 2015-12-01 (×3): qty 0.5

## 2015-12-01 MED ORDER — PANTOPRAZOLE SODIUM 40 MG PO TBEC
40.0000 mg | DELAYED_RELEASE_TABLET | Freq: Every day | ORAL | Status: DC
  Administered 2015-12-01 – 2015-12-03 (×3): 40 mg via ORAL
  Filled 2015-12-01 (×3): qty 1

## 2015-12-01 MED ORDER — ONDANSETRON HCL 4 MG/2ML IJ SOLN: 4.0000 mg | Freq: Four times a day (QID) | INTRAMUSCULAR | Status: DC | PRN

## 2015-12-01 MED ORDER — ROSUVASTATIN CALCIUM 10 MG PO TABS
40.0000 mg | ORAL_TABLET | Freq: Every day | ORAL | Status: DC
  Administered 2015-12-01 – 2015-12-03 (×3): 40 mg via ORAL
  Filled 2015-12-01 (×3): qty 4

## 2015-12-01 MED ORDER — SODIUM CHLORIDE 0.9 % IV BOLUS (SEPSIS)
500.0000 mL | Freq: Once | INTRAVENOUS | Status: AC
  Administered 2015-12-01: 500 mL via INTRAVENOUS

## 2015-12-01 MED ORDER — ISOSORBIDE DINITRATE 30 MG PO TABS
30.0000 mg | ORAL_TABLET | Freq: Two times a day (BID) | ORAL | Status: DC
  Administered 2015-12-01 – 2015-12-03 (×4): 30 mg via ORAL
  Filled 2015-12-01 (×6): qty 1

## 2015-12-01 MED ORDER — LORATADINE 10 MG PO TABS
10.0000 mg | ORAL_TABLET | Freq: Every day | ORAL | Status: DC
  Administered 2015-12-01 – 2015-12-03 (×3): 10 mg via ORAL
  Filled 2015-12-01 (×3): qty 1

## 2015-12-01 MED ORDER — METOPROLOL TARTRATE 12.5 MG HALF TABLET
12.5000 mg | ORAL_TABLET | Freq: Two times a day (BID) | ORAL | Status: DC
  Administered 2015-12-02 – 2015-12-03 (×3): 12.5 mg via ORAL
  Filled 2015-12-01 (×3): qty 1

## 2015-12-01 MED ORDER — ALBUTEROL SULFATE (2.5 MG/3ML) 0.083% IN NEBU: 2.5000 mg | INHALATION_SOLUTION | Freq: Four times a day (QID) | RESPIRATORY_TRACT | Status: DC | PRN

## 2015-12-01 MED ORDER — TRAMADOL HCL 50 MG PO TABS: 50.0000 mg | ORAL_TABLET | Freq: Four times a day (QID) | ORAL | Status: DC | PRN

## 2015-12-01 MED ORDER — MORPHINE SULFATE (PF) 2 MG/ML IV SOLN: 2.0000 mg | INTRAVENOUS | Status: DC | PRN

## 2015-12-01 MED ORDER — ENOXAPARIN SODIUM 40 MG/0.4ML ~~LOC~~ SOLN
40.0000 mg | Freq: Every day | SUBCUTANEOUS | Status: DC
  Administered 2015-12-01 – 2015-12-02 (×2): 40 mg via SUBCUTANEOUS
  Filled 2015-12-01 (×2): qty 0.4

## 2015-12-01 MED ORDER — METOPROLOL TARTRATE 12.5 MG HALF TABLET
12.5000 mg | ORAL_TABLET | Freq: Two times a day (BID) | ORAL | Status: DC
  Administered 2015-12-01: 12.5 mg via ORAL
  Filled 2015-12-01 (×2): qty 1

## 2015-12-01 MED ORDER — METHOCARBAMOL 500 MG PO TABS
500.0000 mg | ORAL_TABLET | Freq: Three times a day (TID) | ORAL | Status: DC
  Administered 2015-12-01 – 2015-12-03 (×7): 500 mg via ORAL
  Filled 2015-12-01 (×7): qty 1

## 2015-12-01 NOTE — Progress Notes (Signed)
Patients bp running low 74/59 heart rate 60. Rechecked bp was low called MD new orders given. 500 cc Bolus started and BP rechecked now 91/53. Will continue to monitor.  Makesha Belitz, Mervin Kung RN

## 2015-12-01 NOTE — Consult Note (Signed)
Reason for Consult:   Back pain similar to prior angina  Requesting Physician: [facility] Primary Cardiologist Dr Julianne Handler  HPI:   Pleasant 82 y/o female followed by Dr Julianne Handler with a history of CABG in 2006, low risk Myoview June 2016, moderate to severe AS by echo June 2016, HTN with HCVD, normal LVF with grade 2 DD, HLD, and PVD s/p prior RCE. She is a CHADs VASc 7 but declines anticoagulation. She has a history of ASA allergy and is on Plavix. LOV with Dr Julianne Handler was 11/13/15 and she was doing well then. The pt lives at home with her daughter. She ambulates with a walker, she does not go outside. On the day of admission she was standing at the sink doing dishes when she developed Lt sub scapular pain that she felt was similar to her pre PCI symptoms. She took 3 NTG at home with partial relief and came to the ED for further evaluation. Her Troponin have been negative. Her EKG shows AF with slow VR, she was in NSR with 1st degree AVB on her last OV.   PMHx:  Past Medical History  Diagnosis Date  . Asthma   . GERD (gastroesophageal reflux disease)   . Hyperlipidemia   . Aspirin allergy     on Plavix  . Hypertension   . Eczema   . ASHD (arteriosclerotic heart disease)   . CAD (coronary artery disease) 06/2001    CABG '06  . Bronchitis   . Stroke ([place])   . Atrial fibrillation ([place]) 04/2014    declined anticoagulation   . Anginal pain ([place])   . Heart murmur   . Pneumonia 2015 ?  Marland Kitchen Arthritis     OSTEO  . Skin cancer of arm   . Myocardial infarction [facility]) 2003    Stent to CFX    Past Surgical History  Procedure Laterality Date  . Abdominal hysterectomy  1976  . Carotid endarterectomy  2010  . Coronary artery bypass graft  01/2005    LIMA-D1; SVG-LAD; SVG-OM; SVG-PDA  . Tonsillectomy    . Cardiac surgery  stents  . Tumor removal      SOCHx:  reports that she has never smoked. She quit smokeless tobacco use about 14 years ago. Her smokeless tobacco use  included Snuff. She reports that she does not drink alcohol or use illicit drugs.  FAMHx: Family History  Problem Relation Age of Onset  . Heart failure Mother   . Other Father     ALLERGIES: Allergies  Allergen Reactions  . Bactrim [Sulfamethoxazole-Trimethoprim] Other (See Comments)    Makes her very strange   . Amitriptyline Hcl Rash  . Aspirin Swelling and Rash  . Zetia [Ezetimibe] Rash    ROS: Review of Systems: General: negative for chills, fever, night sweats or weight changes.  Cardiovascular: negative for dyspnea on exertion, edema, orthopnea, palpitations, paroxysmal nocturnal dyspnea or shortness of breath HEENT: negative for any visual disturbances, blindness, glaucoma Dermatological: negative for rash Respiratory: negative for cough, hemoptysis, or wheezing Urologic: negative for hematuria or dysuria Abdominal: negative for nausea, vomiting, diarrhea, bright red blood per rectum, melena, or hematemesis Neurologic: negative for visual changes, syncope, or dizziness Musculoskeletal: negative for back pain, joint pain, or swelling Psych: cooperative and appropriate All other systems reviewed and are otherwise negative except as noted above.   HOME MEDICATIONS: Prior to Admission medications   Medication Sig Start Date End Date Taking? Authorizing Provider  albuterol (PROVENTIL HFA;VENTOLIN HFA) 108 (90 BASE) MCG/ACT inhaler Inhale 2 puffs into the lungs every 6 (six) hours as needed for wheezing or shortness of breath. 04/09/15  Yes Denita Lung, MD  calcium-vitamin D (OSCAL WITH D) 500-200 MG-UNIT per tablet Take 1 tablet by mouth daily with breakfast.   Yes Historical Provider, MD  cholecalciferol (VITAMIN D) 1000 UNITS tablet Take 1,000 Units by mouth daily.   Yes Historical Provider, MD  clopidogrel (PLAVIX) 75 MG tablet TAKE 1 TABLET BY MOUTH DAILY 08/12/15  Yes Burnell Blanks, MD  diazepam (VALIUM) 2 MG tablet Take 2 mg by mouth every 6 (six) hours  as needed (dizziness). 06/04/13  Yes Denita Lung, MD  diltiazem (CARDIZEM CD) 180 MG 24 hr capsule take 1 capsule by mouth once daily 09/29/15  Yes Burnell Blanks, MD  fluticasone [facility]) 50 MCG/ACT nasal spray instill 2 sprays into each nostril once daily 09/01/15  Yes Denita Lung, MD  fluticasone [facility] HFA) 110 MCG/ACT inhaler inhale 1 puff by mouth twice a day 08/17/15  Yes Denita Lung, MD  guaiFENesin (MUCINEX) 600 MG 12 hr tablet Take 1 tablet (600 mg total) by mouth 2 (two) times daily. 05/25/14  Yes Belkys A Regalado, MD  guaiFENesin (ROBITUSSIN) 100 MG/5ML liquid Take 100 mg by mouth 3 (three) times daily as needed for cough.   Yes Historical Provider, MD  isosorbide dinitrate (ISORDIL) 30 MG tablet Take 1 tablet (30 mg total) by mouth 2 (two) times daily. 09/09/15  Yes Burnell Blanks, MD  loratadine (CLARITIN) 10 MG tablet Take 10 mg by mouth daily.     Yes Historical Provider, MD  metoprolol succinate (TOPROL-XL) 50 MG 24 hr tablet take 1 tablet by mouth once daily WITH BREAKFAST TAKE WITH OR IMMEDIATELY FOLLOWING A MEAL 11/02/15  Yes Denita Lung, MD  nitroGLYCERIN (NITROSTAT) 0.4 MG SL tablet PLACE 1 TABLET UNDER THE TONGUE EVERY 5 MINUTES AS NEEDED FOR CHEST PAIN( MAX 3 TABLETS) 07/31/15  Yes Burnell Blanks, MD  omeprazole (PRILOSEC) 20 MG capsule Take 2 capsules (40 mg total) by mouth daily. 09/29/15  Yes Denita Lung, MD  rosuvastatin (CRESTOR) 40 MG tablet Take 1 tablet (40 mg total) by mouth daily. 10/13/15  Yes Denita Lung, MD  traMADol Veatrice Bourbon) 50 MG tablet take 2 tablets by mouth three times a day if needed for pain 08/27/15  Yes Denita Lung, MD  valsartan (DIOVAN) 40 MG tablet take 1 tablet by mouth once daily 09/01/15  Yes Burnell Blanks, MD    HOSPITAL MEDICATIONS: I have reviewed the patient's current medications.  VITALS: Blood pressure 74/50, pulse 62, temperature 98.2 F (36.8 C), temperature source Oral, resp. rate 18, height 5'  5" (1.651 m), weight 134 lb 3.2 oz (60.873 kg), SpO2 95 %.  PHYSICAL EXAM: General appearance: alert, cooperative and no distress Neck: no JVD and bilateral tranmitted murmur, RCE scar Lungs: few crackles Lt base-tender to palpation Lt scapula Heart: regular rate and rhythm and 2/6 sytolic murmur AOV Abdomen: obese, non tender Extremities: no edema  Pulses: 2+ and symmetric Skin: pale, cool, dry Neurologic: Grossly normal  LABS: Results for orders placed or performed during the hospital encounter of 11/30/15 (from the past 24 hour(s))  Basic metabolic panel     Status: Abnormal   Collection Time: 11/30/15  5:30 PM  Result Value Ref Range   Sodium 140 135 - 145 mmol/L   Potassium 4.0 3.5 - 5.1  mmol/L   Chloride 108 101 - 111 mmol/L   CO2 24 22 - 32 mmol/L   Glucose, Bld 126 (H) 65 - 99 mg/dL   BUN 15 6 - 20 mg/dL   Creatinine, Ser 0.61 0.44 - 1.00 mg/dL   Calcium 9.6 8.9 - 10.3 mg/dL   GFR calc non Af Amer >60 >60 mL/min   GFR calc Af Amer >60 >60 mL/min   Anion gap 8 5 - 15  CBC     Status: Abnormal   Collection Time: 11/30/15  5:30 PM  Result Value Ref Range   WBC 6.3 4.0 - 10.5 K/uL   RBC 4.02 3.87 - 5.11 MIL/uL   Hemoglobin 11.9 (L) 12.0 - 15.0 g/dL   HCT 37.9 36.0 - 46.0 %   MCV 94.3 78.0 - 100.0 fL   MCH 29.6 26.0 - 34.0 pg   MCHC 31.4 30.0 - 36.0 g/dL   RDW 14.0 11.5 - 15.5 %   Platelets 188 150 - 400 K/uL  I-stat troponin, ED     Status: None   Collection Time: 11/30/15  5:41 PM  Result Value Ref Range   Troponin i, poc 0.00 0.00 - 0.08 ng/mL   Comment 3          Troponin I-serum (0, 3, 6 hours)     Status: None   Collection Time: 12/01/15  4:06 AM  Result Value Ref Range   Troponin I <0.03 <0.03 ng/mL  CBC     Status: None   Collection Time: 12/01/15  4:06 AM  Result Value Ref Range   WBC 6.3 4.0 - 10.5 K/uL   RBC 4.09 3.87 - 5.11 MIL/uL   Hemoglobin 12.2 12.0 - 15.0 g/dL   HCT 38.3 36.0 - 46.0 %   MCV 93.6 78.0 - 100.0 fL   MCH 29.8 26.0 - 34.0 pg    MCHC 31.9 30.0 - 36.0 g/dL   RDW 13.9 11.5 - 15.5 %   Platelets 177 150 - 400 K/uL  Creatinine, serum     Status: None   Collection Time: 12/01/15  4:06 AM  Result Value Ref Range   Creatinine, Ser 0.48 0.44 - 1.00 mg/dL   GFR calc non Af Amer >60 >60 mL/min   GFR calc Af Amer >60 >60 mL/min  Troponin I-serum (0, 3, 6 hours)     Status: None   Collection Time: 12/01/15  6:46 AM  Result Value Ref Range   Troponin I <0.03 <0.03 ng/mL  Troponin I-serum (0, 3, 6 hours)     Status: None   Collection Time: 12/01/15  8:55 AM  Result Value Ref Range   Troponin I <0.03 <0.03 ng/mL    EKG: AF with VR 54  IMAGING: Dg Chest 2 View  11/30/2015  CLINICAL DATA:  Chest pain and shortness of breath for 1 day. Initial encounter. EXAM: CHEST  2 VIEW COMPARISON:  PA and lateral chest 10/23/2014. FINDINGS: The patient is status post CABG. Heart size is upper normal. Calcified granuloma right lower lobe is unchanged. No pneumothorax or pleural effusion. Scoliosis is again seen. IMPRESSION: No acute disease.  Stable compared to prior exam. Electronically Signed   By: Inge Rise M.D.   On: 11/30/2015 18:39    IMPRESSION: Principal Problem:   Back pain-similar to pt's angina Active Problems:   Hx of CABG 2006 with Maze   PAF (paroxysmal atrial fibrillation) (HCC)   moderate to severe aortic stenosis   HCVD- grade 2 DD. normal  LVF   Hyperlipidemia   History of allergy to aspirin   Carotid artery disease- s/p RCE   Asthma   RECOMMENDATION: Agree with changing Toprol 50 mg- metoprolol 12.5 mg BID (start tomorrow), also change Diltiazem to 120 mg daily. Her B/P has been on the low side- she is probably better off on low dose beta blocker and lower dose Diltiazem than on an ARB- this may need to be stopped at discharge. MD to see  Time Spent Directly with Patient: [address] minutes  Kerin Ransom, [place] beeper 12/01/2015, 2:05 PM  The patient has been seen in conjunction with Kerin Ransom,  PA-C. All aspects of care have been considered and discussed. The patient has been personally interviewed, examined, and all clinical data has been reviewed.   Atypical presentation with bilateral left greater than right shoulder discomfort lasting for greater than an hour with negative cardiac markers. Prior history of acute infarction and shoulder symptoms as the presenting complaint. She has subsequently undergone coronary bypass grafting and has moderately severe aortic stenosis and atrial fibrillation. She has refused anticoagulation.  Clinical exam reveals a systolic murmur compatible with aortic stenosis. There is no JVD. Does no palpable shoulder and neck tenderness. She does complain of soreness with movement of her neck. Decreased breath sounds are heard at both bases. Extremities reveal no edema. EKGs have not demonstrated evidence of acute injury but bradycardia was present and there appeared to be intermittent sinus rhythm and atrial fibrillation.  I am not convinced that the presentation was due to myocardial ischemia. I will recommend that we repeat her echo to exclude progression of aortic stenosis. Cycle cardiac markers. Ambulate the patient. If no recurrence of symptoms, I would choose a conservative approach at this time. She is against having nuclear perfusion imaging because of bad prior experience.  We will reassess her in the a.m. and determine if any further evaluation is necessary. In the meantime I will keep her nothing by mouth.

## 2015-12-01 NOTE — Progress Notes (Signed)
Patient recd from ED and oriented to room.  Daughter at bedside. Tele monitoring and skin assessment completed. Call light within reach.

## 2015-12-01 NOTE — Progress Notes (Signed)
PROGRESS NOTE    SAMEEHA WICHMANN  [id number] DOB: 03-03-1929 DOA: 11/30/2015 PCP: Wyatt Haste, MD    Brief Narrative: [name] is a 82 y.o. female with medical history significant of CAD status post bypass surgery in September 2006 (Dr. Prescott Gum) as well as a Cox-Maze procedure for atrial fibrillation, CVA, carotid artery stenosis s/p R CEA, fibromyalgia, GERD, hiatal hernia, severe kyphoscoloisis, moderate MR, moderately severe AS presented  with Back pain associated with some sob.    Assessment & Plan:   Principal Problem:   Back pain-similar to pt's angina Active Problems:   Asthma   Hyperlipidemia   Hx of CABG 2006 with Maze   PAF (paroxysmal atrial fibrillation) (HCC)   History of allergy to aspirin   moderate to severe aortic stenosis   HCVD- grade 2 DD. normal LVF   Carotid artery disease- s/p RCE   Back pain associated with some sob: Pt reports her last MI presented in the same way,. Admitted for  ACS.  Hypotensive and bradycardic earlier today. Tele monitor showing pauses and brady in 30's. Held diltiazem and metoprolol. Gave her a bolus of 574ml bolus and her bp normalized. Cardiology consulted, awaiting recommendations. Pain control for back pain.  PAF: Not on anti coagulation. Rate in low 40's to 60's.  CAD s/p CABG s/p cox Maze procedure: No chest pain on admission. Not on anti coagulation.   Asthma : No wheezing.   Hyperlipidemia: Resume crestor.    DVT prophylaxis: (Lovenox) Code Status: (Full/) Family Communication: daughter at bedside.  Disposition Plan: pending further investigation.    Consultants:   Cardiology   Procedures: none  Antimicrobials: none  Subjective: Reports some back pain.   Objective: Filed Vitals:   12/01/15 0245 12/01/15 0347 12/01/15 1133 12/01/15 1300  BP: 119/44 156/64 113/64 74/50  Pulse: 63 68  62  Temp:  98.4 F (36.9 C)  98.2 F (36.8 C)  TempSrc:  Oral  Oral  Resp: 16 16  18     Height:  5\' 5"  (1.651 m)    Weight:  60.873 kg (134 lb 3.2 oz)    SpO2: 98% 95%  95%    Intake/Output Summary (Last 24 hours) at 12/01/15 1647 Last data filed at 12/01/15 1314  Gross per 24 hour  Intake    600 ml  Output    450 ml  Net    150 ml   Filed Weights   12/01/15 0347  Weight: 60.873 kg (134 lb 3.2 oz)    Examination:  General exam: Appears calm and comfortable  Respiratory system: Clear to auscultation. Respiratory effort normal. Cardiovascular system: S1 & S2 heard, RRR. No JVD, murmurs, rubs, gallops or clicks. No pedal edema. Gastrointestinal system: Abdomen is nondistended, soft and nontender. No organomegaly or masses felt. Normal bowel sounds heard. Central nervous system: Alert and oriented. No focal neurological deficits. Extremities: Symmetric 5 x 5 power. Skin: No rashes, lesions or ulcers Psychiatry: Judgement and insight appear normal. Mood & affect appropriate.     Data Reviewed: I have personally reviewed following labs and imaging studies  CBC:  Recent Labs Lab 11/30/15 1730 12/01/15 0406  WBC 6.3 6.3  HGB 11.9* 12.2  HCT 37.9 38.3  MCV 94.3 93.6  PLT 188 [id number]   Basic Metabolic Panel:  Recent Labs Lab 11/30/15 1730 12/01/15 0406  NA 140  --   K 4.0  --   CL 108  --   CO2 24  --   GLUCOSE  126*  --   BUN 15  --   CREATININE 0.61 0.48  CALCIUM 9.6  --    GFR: Estimated Creatinine Clearance: 47.1 mL/min (by C-G formula based on Cr of 0.48). Liver Function Tests: No results for input(s): AST, ALT, ALKPHOS, BILITOT, PROT, ALBUMIN in the last 168 hours. No results for input(s): LIPASE, AMYLASE in the last 168 hours. No results for input(s): AMMONIA in the last 168 hours. Coagulation Profile: No results for input(s): INR, PROTIME in the last 168 hours. Cardiac Enzymes:  Recent Labs Lab 12/01/15 0406 12/01/15 0646 12/01/15 0855  TROPONINI <0.03 <0.03 <0.03   BNP (last 3 results) No results for input(s): PROBNP in the last  8760 hours. HbA1C: No results for input(s): HGBA1C in the last 72 hours. CBG: No results for input(s): GLUCAP in the last 168 hours. Lipid Profile: No results for input(s): CHOL, HDL, LDLCALC, TRIG, CHOLHDL, LDLDIRECT in the last 72 hours. Thyroid Function Tests: No results for input(s): TSH, T4TOTAL, FREET4, T3FREE, THYROIDAB in the last 72 hours. Anemia Panel: No results for input(s): VITAMINB12, FOLATE, FERRITIN, TIBC, IRON, RETICCTPCT in the last 72 hours. Sepsis Labs: No results for input(s): PROCALCITON, LATICACIDVEN in the last 168 hours.  No results found for this or any previous visit (from the past 240 hour(s)).       Radiology Studies: Dg Chest 2 View  11/30/2015  CLINICAL DATA:  Chest pain and shortness of breath for 1 day. Initial encounter. EXAM: CHEST  2 VIEW COMPARISON:  PA and lateral chest 10/23/2014. FINDINGS: The patient is status post CABG. Heart size is upper normal. Calcified granuloma right lower lobe is unchanged. No pneumothorax or pleural effusion. Scoliosis is again seen. IMPRESSION: No acute disease.  Stable compared to prior exam. Electronically Signed   By: Inge Rise M.D.   On: 11/30/2015 18:39        Scheduled Meds: . clopidogrel  75 mg Oral Daily  . [START ON 12/02/2015] diltiazem  120 mg Oral Daily  . enoxaparin (LOVENOX) injection  40 mg Subcutaneous Daily  . irbesartan  37.5 mg Oral Daily  . isosorbide dinitrate  30 mg Oral BID WC  . loratadine  10 mg Oral Daily  . methocarbamol  500 mg Oral Q8H  . [START ON 12/02/2015] metoprolol tartrate  12.5 mg Oral BID  . pantoprazole  40 mg Oral Daily  . rosuvastatin  40 mg Oral Daily   Continuous Infusions: . sodium chloride 50 mL/hr (12/01/15 1515)        Time spent: 30 minutes.    Hosie Poisson, MD [facility] Pager [phone]  If 7PM-7AM, please contact night-coverage [email or web address] Password [facility] 12/01/2015, 4:47 PM

## 2015-12-01 NOTE — Care Management Obs Status (Signed)
[facility] NOTIFICATION   Patient Details  Name: [name] MRN: [id number] Date of Birth: [birth year]   Medicare Observation Status Notification Given:  Yes    Dawayne Patricia, RN 12/01/2015, 2:53 PM

## 2015-12-02 DIAGNOSIS — J449 Chronic obstructive pulmonary disease, unspecified: Secondary | ICD-10-CM | POA: Diagnosis not present

## 2015-12-02 DIAGNOSIS — J4540 Moderate persistent asthma, uncomplicated: Secondary | ICD-10-CM

## 2015-12-02 DIAGNOSIS — E785 Hyperlipidemia, unspecified: Secondary | ICD-10-CM | POA: Diagnosis not present

## 2015-12-02 DIAGNOSIS — I350 Nonrheumatic aortic (valve) stenosis: Secondary | ICD-10-CM

## 2015-12-02 DIAGNOSIS — M5489 Other dorsalgia: Secondary | ICD-10-CM | POA: Diagnosis not present

## 2015-12-02 DIAGNOSIS — I480 Paroxysmal atrial fibrillation: Secondary | ICD-10-CM | POA: Diagnosis not present

## 2015-12-02 DIAGNOSIS — Z951 Presence of aortocoronary bypass graft: Secondary | ICD-10-CM | POA: Diagnosis not present

## 2015-12-02 DIAGNOSIS — M549 Dorsalgia, unspecified: Secondary | ICD-10-CM | POA: Diagnosis not present

## 2015-12-02 DIAGNOSIS — M25512 Pain in left shoulder: Secondary | ICD-10-CM | POA: Diagnosis not present

## 2015-12-02 DIAGNOSIS — R0602 Shortness of breath: Secondary | ICD-10-CM | POA: Diagnosis not present

## 2015-12-02 DIAGNOSIS — K219 Gastro-esophageal reflux disease without esophagitis: Secondary | ICD-10-CM | POA: Diagnosis not present

## 2015-12-02 DIAGNOSIS — Z955 Presence of coronary angioplasty implant and graft: Secondary | ICD-10-CM | POA: Diagnosis not present

## 2015-12-02 DIAGNOSIS — I119 Hypertensive heart disease without heart failure: Secondary | ICD-10-CM | POA: Diagnosis not present

## 2015-12-02 DIAGNOSIS — I252 Old myocardial infarction: Secondary | ICD-10-CM | POA: Diagnosis not present

## 2015-12-02 NOTE — Progress Notes (Signed)
       Patient Name: [name] Date of Encounter: 12/02/2015    SUBJECTIVE: Still with some mild aching in her shoulders bilaterally. No chest discomfort. Denies dyspnea.  TELEMETRY:  Normal sinus rhythm versus atrial fibrillation Filed Vitals:   12/01/15 1817 12/01/15 2033 12/02/15 0339 12/02/15 1004  BP: 103/50 130/45 159/60 154/83  Pulse: 66 66 72   Temp:  97.7 F (36.5 C) 97.9 F (36.6 C)   TempSrc:  Oral Oral   Resp:  18 18   Height:      Weight:      SpO2:  95% 97%     Intake/Output Summary (Last 24 hours) at 12/02/15 1124 Last data filed at 12/02/15 0842  Gross per 24 hour  Intake    480 ml  Output      0 ml  Net    480 ml   LABS: Basic Metabolic Panel:  Recent Labs  11/30/15 1730 12/01/15 0406  NA 140  --   K 4.0  --   CL 108  --   CO2 24  --   GLUCOSE 126*  --   BUN 15  --   CREATININE 0.61 0.48  CALCIUM 9.6  --    CBC:  Recent Labs  11/30/15 1730 12/01/15 0406  WBC 6.3 6.3  HGB 11.9* 12.2  HCT 37.9 38.3  MCV 94.3 93.6  PLT 188 177   Cardiac Enzymes:  Recent Labs  12/01/15 0406 12/01/15 0646 12/01/15 0855  TROPONINI <0.03 <0.03 <0.03     Radiology/Studies:  No new data  Physical Exam: Blood pressure 154/83, pulse 72, temperature 97.9 F (36.6 C), temperature source Oral, resp. rate 18, height 5\' 5"  (1.651 m), weight 134 lb 3.2 oz (60.873 kg), SpO2 97 %. Weight change:   Wt Readings from Last 3 Encounters:  12/01/15 134 lb 3.2 oz (60.873 kg)  11/13/15 134 lb 6.4 oz (60.963 kg)  08/21/15 134 lb ([id number] kg)    Systolic murmur consistent with aortic stenosis. No palpable chest wall or vertebral triggerpoints for pain. Neck veins are flat.  ASSESSMENT:  1. Persisting bilateral shoulder discomfort, likely musculoskeletal and possibly related to cervical disc disease. Myocardial infarction is been excluded. I do not recommend a myocardial ischemia workup. She will refuse a nuclear study. Data does not support coronary  angiography in this elderly lady. 2. Aortic stenosis perhaps severe at this time.  Plan:  1. No further ischemic evaluation at this time. She should follow-up with Dr. Angelena Form within the next 4 weeks. 2. Please complete echocardiogram prior to discharge. 3. Please call if we may be of further assistance  Signed, Belva Crome III 12/02/2015, 11:24 AM

## 2015-12-02 NOTE — Progress Notes (Signed)
PROGRESS NOTE    [name]  [id number] DOB: March 07, 1929 DOA: 11/30/2015 PCP: Lori Haste, MD   Brief Narrative:  HPI on 11/30/2015 by Dr. Toy Baker [name] is a 82 y.o. female with medical history significant of CAD status post bypass surgery in September 2006 (Dr. Prescott Gum) as well as a Cox-Maze procedure for atrial fibrillation, CVA, carotid artery stenosis s/p R CEA, fibromyalgia, GERD, hiatal hernia, severe kyphoscoloisis, moderate MR, moderately severe AS  Presented with Back pain similar to prior MI but much milder. It started around 3 PM she just finished washing dishes. This was associated with mild shortness of breath, some nausea. No vomiting. Pain Did not radiate. She took 3 Nitro's and her pain improved by the time she presented to ER she was pain free.  Denies any fever no chills. Pain was not changed by breathing or position.  Regarding pertinent Chronic problems: Regarding History of coronary artery disease Myoview 8/10: EF 70% small lateral infarct no ischemia, REPEAT 20016 NO ISCHEMIA. RRECURRENT ADMISSIONS FOR CHEST PAIN Regarding carotid aRTERY DISEASE Carotid artery dopplers April 2013 with 40-59% bilateral stenosis with stable RCEA site  Assessment & Plan   Back pain associated with some SOB- Ruled out ACS -Patient reported her last MI presented in the same way, Admitted for ACS.  -patient was hypotensive and bradycardic yesterday -Held diltiazem and metoprolol. Gave her a bolus of 527ml bolus and her bp normalized. -Pain control for back pain. -Cardiology consulted and appreciated- does not feel this is ACS related, troponins negative, feels this may be musculoskeletal related to cervical disc disease, nuc scan refused. No further ischemic evaluation at this time. Follow up with Dr. Angelena Form within 4 weeks.  -Echocardiogram pending  PAF -Not on anti coagulation due to bleeding with Xarelto and dislike of coumadin -currently rate  controlled -Will restart diltiazem -CHADSVASC 6 (age, gender, HTN, h/o CVA)  CAD s/p CABG  -s/p cox Maze procedure:  -No chest pain on admission. -Not on anti coagulation.   Asthma -No wheezing  Hyperlipidemia -Resume crestor  DVT Prophylaxis  Lovenox  Code Status: Full  Family Communication: Daughter at bedside  Disposition Plan: Admitted, pending echocardiogram  Consultants Cardiology  Procedures  None  Antibiotics   Anti-infectives    None      Subjective:   [name] seen and examined today.  Patient states she is feeling mildly better. Continues to have back pain and aching. Denies chest pain, shortness of breath, abdominal pain, nausea, vomiting, dizziness, headache.   Objective:   Filed Vitals:   12/01/15 1817 12/01/15 2033 12/02/15 0339 12/02/15 1004  BP: 103/50 130/45 159/60 154/83  Pulse: 66 66 72   Temp:  97.7 F (36.5 C) 97.9 F (36.6 C)   TempSrc:  Oral Oral   Resp:  18 18   Height:      Weight:      SpO2:  95% 97%     Intake/Output Summary (Last 24 hours) at 12/02/15 1219 Last data filed at 12/02/15 [id number]  Gross per 24 hour  Intake    480 ml  Output      0 ml  Net    480 ml   Filed Weights   12/01/15 0347  Weight: 60.873 kg (134 lb 3.2 oz)    Exam  General: Well developed, well nourished, NAD, appears stated age  53: NCAT, mucous membranes moist.   Cardiovascular: S1 S2 auscultated, 2/6SEM, regular  Respiratory: Clear to auscultation bilaterally with equal  chest rise  Abdomen: Soft, nontender, nondistended, + bowel sounds  Extremities: warm dry without cyanosis clubbing or edema  Neuro: AAOx3, nonfocal  Skin: Without rashes exudates or nodules  Psych: Normal affect and demeanor with intact judgement and insight   Data Reviewed: I have personally reviewed following labs and imaging studies  CBC:  Recent Labs Lab 11/30/15 1730 12/01/15 0406  WBC 6.3 6.3  HGB 11.9* 12.2  HCT 37.9 38.3  MCV 94.3 93.6    PLT 188 [id number]   Basic Metabolic Panel:  Recent Labs Lab 11/30/15 1730 12/01/15 0406  NA 140  --   K 4.0  --   CL 108  --   CO2 24  --   GLUCOSE 126*  --   BUN 15  --   CREATININE 0.61 0.48  CALCIUM 9.6  --    GFR: Estimated Creatinine Clearance: 47.1 mL/min (by C-G formula based on Cr of 0.48). Liver Function Tests: No results for input(s): AST, ALT, ALKPHOS, BILITOT, PROT, ALBUMIN in the last 168 hours. No results for input(s): LIPASE, AMYLASE in the last 168 hours. No results for input(s): AMMONIA in the last 168 hours. Coagulation Profile: No results for input(s): INR, PROTIME in the last 168 hours. Cardiac Enzymes:  Recent Labs Lab 12/01/15 0406 12/01/15 0646 12/01/15 0855  TROPONINI <0.03 <0.03 <0.03   BNP (last 3 results) No results for input(s): PROBNP in the last 8760 hours. HbA1C: No results for input(s): HGBA1C in the last 72 hours. CBG: No results for input(s): GLUCAP in the last 168 hours. Lipid Profile: No results for input(s): CHOL, HDL, LDLCALC, TRIG, CHOLHDL, LDLDIRECT in the last 72 hours. Thyroid Function Tests: No results for input(s): TSH, T4TOTAL, FREET4, T3FREE, THYROIDAB in the last 72 hours. Anemia Panel: No results for input(s): VITAMINB12, FOLATE, FERRITIN, TIBC, IRON, RETICCTPCT in the last 72 hours. Urine analysis:    Component Value Date/Time   COLORURINE YELLOW 05/19/2014 1601   APPEARANCEUR CLEAR 05/19/2014 1601   LABSPEC 1.013 05/19/2014 1601   PHURINE 5.0 05/19/2014 1601   GLUCOSEU NEGATIVE 05/19/2014 1601   HGBUR NEGATIVE 05/19/2014 1601   BILIRUBINUR NEGATIVE 05/19/2014 1601   BILIRUBINUR n 12/05/2011 1627   KETONESUR NEGATIVE 05/19/2014 1601   PROTEINUR NEGATIVE 05/19/2014 1601   PROTEINUR trace 12/05/2011 1627   UROBILINOGEN 0.2 05/19/2014 1601   UROBILINOGEN negative 12/05/2011 1627   NITRITE NEGATIVE 05/19/2014 1601   NITRITE n 12/05/2011 1627   LEUKOCYTESUR NEGATIVE 05/19/2014 1601   Sepsis  Labs: @LABRCNTIP (procalcitonin:4,lacticidven:4)  )No results found for this or any previous visit (from the past 240 hour(s)).    Radiology Studies: Dg Chest 2 View  11/30/2015  CLINICAL DATA:  Chest pain and shortness of breath for 1 day. Initial encounter. EXAM: CHEST  2 VIEW COMPARISON:  PA and lateral chest 10/23/2014. FINDINGS: The patient is status post CABG. Heart size is upper normal. Calcified granuloma right lower lobe is unchanged. No pneumothorax or pleural effusion. Scoliosis is again seen. IMPRESSION: No acute disease.  Stable compared to prior exam. Electronically Signed   By: Inge Rise M.D.   On: 11/30/2015 18:39     Scheduled Meds: . clopidogrel  75 mg Oral Daily  . enoxaparin (LOVENOX) injection  40 mg Subcutaneous Daily  . irbesartan  37.5 mg Oral Daily  . isosorbide dinitrate  30 mg Oral BID WC  . loratadine  10 mg Oral Daily  . methocarbamol  500 mg Oral Q8H  . metoprolol tartrate  12.5 mg Oral BID  .  pantoprazole  40 mg Oral Daily  . rosuvastatin  40 mg Oral Daily   Continuous Infusions: . sodium chloride 50 mL/hr at 12/02/15 1022       Time Spent in minutes   30 minutes  Jobie Popp D.O. on 12/02/2015 at 12:19 PM  Between 7am to 7pm - Pager - [phone]  After 7pm go to [email or web address] - password [email or web address]  And look for the night coverage person covering for me after hours  [facility] Office  [phone]

## 2015-12-03 ENCOUNTER — Observation Stay (HOSPITAL_BASED_OUTPATIENT_CLINIC_OR_DEPARTMENT_OTHER): Payer: Medicare Other

## 2015-12-03 DIAGNOSIS — R079 Chest pain, unspecified: Secondary | ICD-10-CM | POA: Diagnosis not present

## 2015-12-03 DIAGNOSIS — Z888 Allergy status to other drugs, medicaments and biological substances status: Secondary | ICD-10-CM

## 2015-12-03 DIAGNOSIS — J4540 Moderate persistent asthma, uncomplicated: Secondary | ICD-10-CM | POA: Diagnosis not present

## 2015-12-03 DIAGNOSIS — I110 Hypertensive heart disease with heart failure: Secondary | ICD-10-CM | POA: Diagnosis not present

## 2015-12-03 DIAGNOSIS — M5489 Other dorsalgia: Secondary | ICD-10-CM | POA: Diagnosis not present

## 2015-12-03 DIAGNOSIS — R0602 Shortness of breath: Secondary | ICD-10-CM | POA: Diagnosis not present

## 2015-12-03 LAB — ECHOCARDIOGRAM COMPLETE
Height: 65 in
Weight: 2147.2 oz

## 2015-12-03 MED ORDER — PERFLUTREN LIPID MICROSPHERE
1.0000 mL | INTRAVENOUS | Status: AC | PRN
  Administered 2015-12-03: 2 mL via INTRAVENOUS
  Filled 2015-12-03: qty 10

## 2015-12-03 MED ORDER — METOPROLOL TARTRATE 25 MG PO TABS: 12.5000 mg | ORAL_TABLET | Freq: Two times a day (BID) | ORAL | Status: DC

## 2015-12-03 MED ORDER — PERFLUTREN LIPID MICROSPHERE
INTRAVENOUS | Status: AC
  Filled 2015-12-03: qty 10

## 2015-12-03 NOTE — Progress Notes (Signed)
Echocardiogram 2D Echocardiogram has been performed with definity.  Aggie Cosier 12/03/2015, 8:59 AM

## 2015-12-03 NOTE — Discharge Instructions (Signed)
Aortic Stenosis Aortic stenosis is a narrowing of the aortic valve. The aortic valve is a gate-like structure that is located between the lower left chamber of the heart (left ventricle) and the blood vessel that leads away from the heart (aorta). When the aortic valve is narrowed, it does not open all the way. This makes it hard for the heart to pump blood into the aorta and causes the heart to work harder. The extra work can weaken the heart over time and lead to heart failure. CAUSES  Causes of aortic stenosis include:  Calcium deposits on the aortic valve that have made the valve stiff. This condition generally affects those over the age of 68. It is the most common cause of aortic stenosis.  A birth defect.  Rheumatic fever. This is a problem that may occur after a strep throat infection that was not treated adequately. Rheumatic fever can cause permanent damage to heart valves. SIGNS AND SYMPTOMS  People with aortic stenosis usually have no symptoms until the condition becomes severe. It may take 10-20 years for mild or moderate aortic stenosis to become severe. Symptoms may include:   Shortness of breath, especially with physical activity.   Feeling weak and tired (fatigued) or getting tired easily.  Chest discomfort (angina). This may occur with minimal activity if the aortic stenosis is severe.  An irregular or faster-than-normal heartbeat.  Dizziness or fainting that happens with exertion or after taking certain heart medicines (such as nitroglycerin). DIAGNOSIS  Aortic stenosis is usually diagnosed with a physical exam and with a type of imaging test called echocardiography. During echocardiography, sound waves are used to evaluate how blood flows through the heart. If your health care provider suspects aortic stenosis but the test does not clearly show it, a procedure called cardiac catheterization may be done to diagnose the condition. Tests may also be done to evaluate heart  function. They may include:  Electrocardiography. During this test, the electrical impulses of the heart are recorded while you are lying down and sticky patches are placed on your chest, arms, and legs.  Stress tests. There is more than one type of stress test. If a stress test is needed, ask your health care provider about which type is best for you.  Blood tests. TREATMENT  Treatment depends on how severe the aortic stenosis is, your symptoms, and the problems it is causing.   Observation. If the aortic stenosis is mild, no treatment may be needed. However, you will need to have the condition checked regularly to make sure it is not getting worse or causing serious problems.  Surgery. Surgery to repair or replace the aortic valve is the most common treatment for aortic stenosis. Several types of surgeries are available. The most common are open-heart surgery and transcutaneous aortic valve replacement (TAVR). TAVR does not require that the chest be opened. It is usually performed on elderly patients and those who are not able to have open-heart surgery.  Medicines. Medicines may be given to keep symptoms from getting worse. Medicines cannot reverse aortic stenosis. HOME CARE INSTRUCTIONS   You may need to avoid certain types of physical activity. If your aortic stenosis is mild, you may need to avoid only strenuous activity. The more severe your aortic stenosis, the more activities you will need to avoid. Talk with your health care provider about the types of activity you should avoid.  Take medicines only as directed by your health care provider.  If you are a woman with  aortic valve stenosis and want to get pregnant, talk to your health care provider before you become pregnant.  If you are a woman with aortic valve stenosis and are pregnant, keep all follow-up visits with all recommended health care providers.  Keep all follow-up visits for tests, exams, and treatments as directed by  your health care provider. SEEK IMMEDIATE MEDICAL CARE IF:  You develop chest pain or tightness.   You develop shortness of breath or difficulty breathing.   You develop light-headedness or faint.   It feels like your heartbeat is irregular or faster than normal.  You have a fever.   This information is not intended to replace advice given to you by your health care provider. Make sure you discuss any questions you have with your health care provider.   Document Released: 02/05/2003 Document Revised: 01/28/2015 Document Reviewed: 05/03/2012 Elsevier Interactive Patient Education 2016 [place]. Chest Pain Observation It is often hard to give a specific diagnosis for the cause of chest pain. Among other possibilities your symptoms might be caused by inadequate oxygen delivery to your heart (angina). Angina that is not treated or evaluated can lead to a heart attack (myocardial infarction) or death. Blood tests, electrocardiograms, and X-rays may have been done to help determine a possible cause of your chest pain. After evaluation and observation, your health care provider has determined that it is unlikely your pain was caused by an unstable condition that requires hospitalization. However, a full evaluation of your pain may need to be completed, with additional diagnostic testing as directed. It is very important to keep your follow-up appointments. Not keeping your follow-up appointments could result in permanent heart damage, disability, or death. If there is any problem keeping your follow-up appointments, you must call your health care provider. HOME CARE INSTRUCTIONS  Due to the slight chance that your pain could be angina, it is important to follow your health care provider's treatment plan and also maintain a healthy lifestyle:  Maintain or work toward achieving a healthy weight.  Stay physically active and exercise regularly.  Decrease your salt intake.  Eat a balanced,  healthy diet. Talk to a dietitian to learn about heart-healthy foods.  Increase your fiber intake by including whole grains, vegetables, fruits, and nuts in your diet.  Avoid situations that cause stress, anger, or depression.  Take medicines as advised by your health care provider. Report any side effects to your health care provider. Do not stop medicines or adjust the dosages on your own.  Quit smoking. Do not use nicotine patches or gum until you check with your health care provider.  Keep your blood pressure, blood sugar, and cholesterol levels within normal limits.  Limit alcohol intake to no more than 1 drink per day for women who are not pregnant and 2 drinks per day for men.  Do not abuse drugs. SEEK IMMEDIATE MEDICAL CARE IF: You have severe chest pain or pressure which may include symptoms such as:  You feel pain or pressure in your arms, neck, jaw, or back.  You have severe back or abdominal pain, feel sick to your stomach (nauseous), or throw up (vomit).  You are sweating profusely.  You are having a fast or irregular heartbeat.  You feel short of breath while at rest.  You notice increasing shortness of breath during rest, sleep, or with activity.  You have chest pain that does not get better after rest or after taking your usual medicine.  You wake from sleep  with chest pain.  You are unable to sleep because you cannot breathe.  You develop a frequent cough or you are coughing up blood.  You feel dizzy, faint, or experience extreme fatigue.  You develop severe weakness, dizziness, fainting, or chills. Any of these symptoms may represent a serious problem that is an emergency. Do not wait to see if the symptoms will go away. Call your local emergency services (911 in the [place] Do not drive yourself to the hospital. MAKE SURE YOU:  Understand these instructions.  Will watch your condition.  Will get help right away if you are not doing well or get  worse.   This information is not intended to replace advice given to you by your health care provider. Make sure you discuss any questions you have with your health care provider.   Document Released: 06/11/2010 Document Revised: 05/14/2013 Document Reviewed: 11/08/2012 Elsevier Interactive Patient Education [employer].

## 2015-12-03 NOTE — Discharge Summary (Signed)
Physician Discharge Summary  [name] [id number] DOB: November 22, 1928 DOA: 11/30/2015  PCP: Wyatt Haste, MD  Admit date: 11/30/2015 Discharge date: 12/03/2015  Time spent: 45 minutes  Recommendations for Outpatient Follow-up:  Patient will be discharged to home.  Patient will need to follow up with primary care provider within one week of discharge.  Follow up with Dr. Angelena Form within 4 weeks.  Patient should continue medications as prescribed.  Patient should follow a heart healthy diet.   Discharge Diagnoses:  Principal Problem:   Back pain-similar to pt's angina Active Problems:   Asthma   Hyperlipidemia   Hx of CABG 2006 with [facility]   PAF (paroxysmal atrial fibrillation) (HCC)   History of allergy to aspirin   moderate to severe aortic stenosis   HCVD- grade 2 DD. normal LVF   Carotid artery disease- s/p RCE   Left shoulder pain   SOB (shortness of breath)   Discharge Condition: Stable  Diet recommendation: Heart healthy  Filed Weights   12/01/15 0347  Weight: 60.873 kg (134 lb 3.2 oz)    History of present illness:  on 11/30/2015 by Dr. Toy Baker [name] is a 82 y.o. female with medical history significant of CAD status post bypass surgery in September 2006 (Dr. Prescott Gum) as well as a Cox-Maze procedure for atrial fibrillation, CVA, carotid artery stenosis s/p R CEA, fibromyalgia, GERD, hiatal hernia, severe kyphoscoloisis, moderate MR, moderately severe AS  Presented with Back pain similar to prior MI but much milder. It started around 3 PM she just finished washing dishes. This was associated with mild shortness of breath, some nausea. No vomiting. Pain Did not radiate. She took 3 Nitro's and her pain improved by the time she presented to ER she was pain free.  Denies any fever no chills. Pain was not changed by breathing or position.  Regarding pertinent Chronic problems: Regarding History of coronary artery disease Myoview 8/10: EF 70%  small lateral infarct no ischemia, REPEAT 20016 NO ISCHEMIA. RRECURRENT ADMISSIONS FOR CHEST PAIN Regarding carotid aRTERY DISEASE Carotid artery dopplers April 2013 with 40-59% bilateral stenosis with stable RCEA site  Hospital Course:  Back pain associated with some SOB- Ruled out ACS -Patient reported her last MI presented in the same way, Admitted for ACS.  -patient was hypotensive and bradycardic yesterday -Held diltiazem and metoprolol. Gave her a bolus of 516ml bolus and her bp normalized. -Pain control for back pain. -Cardiology consulted and appreciated- does not feel this is ACS related, troponins negative, feels this may be musculoskeletal related to cervical disc disease, nuc scan refused. No further ischemic evaluation at this time. Follow up with Dr. Angelena Form within 4 weeks.  -Echocardiogram: Aortic stenosis has advanced when compared to prior echo, peak velocity 446cm/s, mean gradient 56mmHg.  Ef 123456, grade 3 diastolic dysfunction. -Discussed findings of echo with Dr. Tamala Julian, recommended outpatient follow up with patient's primary cardiologist.  Moderate to severe Aortic stenosis -Worse on echocardiogram, follow up with cardiology as an outpatient   PAF -Not on anti coagulation due to bleeding with Xarelto and dislike of coumadin -currently rate controlled -Continue diltiazem -CHADSVASC 6 (age, gender, HTN, h/o CVA) -metoprolol decreased to 12.5mg  BID by cardiology   CAD s/p CABG  -s/p cox Maze procedure:  -No chest pain on admission. -Not on anti coagulation.   Asthma -No wheezingchest   Hyperlipidemia -Resume crestor  Consultants Cardiology  Procedures  Echocardiogram  Discharge Exam: Filed Vitals:   12/03/15 0950 12/03/15 1142  BP:  143/118 136/79  Pulse: 69   Temp:    Resp:      Exam  General: Well developed, well nourished, NAD  HEENT: NCAT, mucous membranes moist.   Cardiovascular: S1 S2 auscultated, 2/6SEM, regular  Respiratory:  Clear to auscultation bilaterally with equal chest rise  Abdomen: Soft, nontender, nondistended, + bowel sounds  Extremities: warm dry without cyanosis clubbing or edema  Neuro: AAOx3, nonfocal  Psych: Normal affect and demeanor   Discharge Instructions      Discharge Instructions    Discharge instructions    Complete by:  As directed   Patient will be discharged to home.  Patient will need to follow up with primary care provider within one week of discharge.  Follow up with Dr. Angelena Form within 4 weeks.  Patient should continue medications as prescribed.  Patient should follow a heart healthy diet.            Medication List    STOP taking these medications        metoprolol succinate 50 MG 24 hr tablet  Commonly known as:  TOPROL-XL      TAKE these medications        albuterol 108 (90 Base) MCG/ACT inhaler  Commonly known as:  PROVENTIL HFA;VENTOLIN HFA  Inhale 2 puffs into the lungs every 6 (six) hours as needed for wheezing or shortness of breath.     calcium-vitamin D 500-200 MG-UNIT tablet  Commonly known as:  OSCAL WITH D  Take 1 tablet by mouth daily with breakfast.     cholecalciferol 1000 units tablet  Commonly known as:  VITAMIN D  Take 1,000 Units by mouth daily.     clopidogrel 75 MG tablet  Commonly known as:  PLAVIX  TAKE 1 TABLET BY MOUTH DAILY     diazepam 2 MG tablet  Commonly known as:  VALIUM  Take 2 mg by mouth every 6 (six) hours as needed (dizziness).     diltiazem 180 MG 24 hr capsule  Commonly known as:  CARDIZEM CD  take 1 capsule by mouth once daily     fluticasone 110 MCG/ACT inhaler  Commonly known as:  FLOVENT HFA  inhale 1 puff by mouth twice a day     fluticasone 50 MCG/ACT nasal spray  Commonly known as:  FLONASE  instill 2 sprays into each nostril once daily     guaiFENesin 100 MG/5ML liquid  Commonly known as:  ROBITUSSIN  Take 100 mg by mouth 3 (three) times daily as needed for cough.     guaiFENesin 600 MG 12 hr  tablet  Commonly known as:  MUCINEX  Take 1 tablet (600 mg total) by mouth 2 (two) times daily.     isosorbide dinitrate 30 MG tablet  Commonly known as:  ISORDIL  Take 1 tablet (30 mg total) by mouth 2 (two) times daily.     loratadine 10 MG tablet  Commonly known as:  CLARITIN  Take 10 mg by mouth daily.     metoprolol tartrate 25 MG tablet  Commonly known as:  LOPRESSOR  Take 0.5 tablets (12.5 mg total) by mouth 2 (two) times daily.     nitroGLYCERIN 0.4 MG SL tablet  Commonly known as:  NITROSTAT  PLACE 1 TABLET UNDER THE TONGUE EVERY 5 MINUTES AS NEEDED FOR CHEST PAIN( MAX 3 TABLETS)     omeprazole 20 MG capsule  Commonly known as:  PRILOSEC  Take 2 capsules (40 mg total) by mouth daily.  rosuvastatin 40 MG tablet  Commonly known as:  CRESTOR  Take 1 tablet (40 mg total) by mouth daily.     traMADol 50 MG tablet  Commonly known as:  ULTRAM  take 2 tablets by mouth three times a day if needed for pain     valsartan 40 MG tablet  Commonly known as:  DIOVAN  take 1 tablet by mouth once daily       Allergies  Allergen Reactions  . Bactrim [Sulfamethoxazole-Trimethoprim] Other (See Comments)    Makes her very strange   . Amitriptyline Hcl Rash  . Aspirin Swelling and Rash  . Zetia [Ezetimibe] Rash   Follow-up Information    Follow up with Wyatt Haste, MD. Schedule an appointment as soon as possible for a visit in 1 week.   Specialty:  Family Medicine   Why:  Hospital follow up   Contact information:   [address] [address] [phone]       Follow up with Lauree Chandler, MD. Schedule an appointment as soon as possible for a visit in 2 weeks.   Specialty:  Cardiology   Why:  Hospital follow up   Contact information:   [place] [place] [address] [phone]        The results of significant diagnostics from this hospitalization (including imaging, microbiology, ancillary and laboratory)  are listed below for reference.    Significant Diagnostic Studies: Dg Chest 2 View  11/30/2015  CLINICAL DATA:  Chest pain and shortness of breath for 1 day. Initial encounter. EXAM: CHEST  2 VIEW COMPARISON:  PA and lateral chest 10/23/2014. FINDINGS: The patient is status post CABG. Heart size is upper normal. Calcified granuloma right lower lobe is unchanged. No pneumothorax or pleural effusion. Scoliosis is again seen. IMPRESSION: No acute disease.  Stable compared to prior exam. Electronically Signed   By: Inge Rise M.D.   On: 11/30/2015 18:39    Microbiology: No results found for this or any previous visit (from the past 240 hour(s)).   Labs: Basic Metabolic Panel:  Recent Labs Lab 11/30/15 1730 12/01/15 0406  NA 140  --   K 4.0  --   CL 108  --   CO2 24  --   GLUCOSE 126*  --   BUN 15  --   CREATININE 0.61 0.48  CALCIUM 9.6  --    Liver Function Tests: No results for input(s): AST, ALT, ALKPHOS, BILITOT, PROT, ALBUMIN in the last 168 hours. No results for input(s): LIPASE, AMYLASE in the last 168 hours. No results for input(s): AMMONIA in the last 168 hours. CBC:  Recent Labs Lab 11/30/15 1730 12/01/15 0406  WBC 6.3 6.3  HGB 11.9* 12.2  HCT 37.9 38.3  MCV 94.3 93.6  PLT 188 177   Cardiac Enzymes:  Recent Labs Lab 12/01/15 0406 12/01/15 0646 12/01/15 0855  TROPONINI <0.03 <0.03 <0.03   BNP: BNP (last 3 results) No results for input(s): BNP in the last 8760 hours.  ProBNP (last 3 results) No results for input(s): PROBNP in the last 8760 hours.  CBG: No results for input(s): GLUCAP in the last 168 hours.     SignedJALANI, BURSE  [facility] 12/03/2015, 11:45 AM

## 2015-12-04 ENCOUNTER — Telehealth: Payer: Self-pay | Admitting: Family Medicine

## 2015-12-04 NOTE — Telephone Encounter (Signed)
Pt called for hospital follow TOC. Pt stated that she is doing ok. When pressed that's all she would say. Daughter was then put on phone and she stated that she was tired. Medication were discussed and meds stayed the same with the exception of Metoprolol. That dose was changed, she now is prescribed 25 mg and she takes 12.5 in the am and 12.5 pm. She also stated that all medications were filled at home. Pt's daughter made an appt to see JCL on Tuesday next week. After hours protocol was discussed and daughter verbalized understanding. She was also informed to bring all medications with her.

## 2015-12-08 ENCOUNTER — Ambulatory Visit (INDEPENDENT_AMBULATORY_CARE_PROVIDER_SITE_OTHER): Payer: Medicare Other | Admitting: Family Medicine

## 2015-12-08 ENCOUNTER — Encounter: Payer: Self-pay | Admitting: Family Medicine

## 2015-12-08 VITALS — BP 110/60 | HR 70 | Wt 134.0 lb

## 2015-12-08 DIAGNOSIS — I350 Nonrheumatic aortic (valve) stenosis: Secondary | ICD-10-CM | POA: Diagnosis not present

## 2015-12-08 DIAGNOSIS — M5489 Other dorsalgia: Secondary | ICD-10-CM | POA: Diagnosis not present

## 2015-12-08 DIAGNOSIS — Z951 Presence of aortocoronary bypass graft: Secondary | ICD-10-CM | POA: Diagnosis not present

## 2015-12-08 DIAGNOSIS — R7309 Other abnormal glucose: Secondary | ICD-10-CM

## 2015-12-08 DIAGNOSIS — R739 Hyperglycemia, unspecified: Secondary | ICD-10-CM

## 2015-12-08 NOTE — Progress Notes (Signed)
   Subjective:    Patient ID: [name], female    DOB: Jul 13, 1928, 82 y.o.   MRN: [id number]  HPI She is here for follow-up visit after recent hospital visit. She was admitted and evaluated for chest and back pain that she states was very similar to when she had her heart attack. The hospital record including lab and x-ray and discharge summary was reviewed. She did have her metoprolol reduced to 12.5 mg twice a day due to some hypotension. Also of note was her blood sugar being slightly elevated. She also has a previous history of right hand fracture and this scheduled to see orthopedics tomorrow. At the present time she is having no new shortness of breath, chest pain, DOE. She does occasionally have chest pain and does use NTG but states this is her normal pattern.   Review of Systems     Objective:   Physical Exam Alert and in no distress. Cardiac exam does show a systolic murmur. Lungs are clear to auscultation. Hemoglobin A1c is 5.8        Assessment & Plan:  Other back pain  Aortic stenosis, moderate  Elevated blood sugar - Plan: POCT glycosylated hemoglobin (Hb A1C)  Hx of CABG 2006 with [facility]  moderate to severe aortic stenosis She will continue as in present medication regimen, follow-up with orthopedics and with cardiology as previously scheduled.

## 2015-12-10 LAB — POCT GLYCOSYLATED HEMOGLOBIN (HGB A1C): HEMOGLOBIN A1C: 5.8

## 2015-12-11 DIAGNOSIS — G5602 Carpal tunnel syndrome, left upper limb: Secondary | ICD-10-CM | POA: Diagnosis not present

## 2015-12-11 DIAGNOSIS — S62001D Unspecified fracture of navicular [scaphoid] bone of right wrist, subsequent encounter for fracture with routine healing: Secondary | ICD-10-CM | POA: Diagnosis not present

## 2015-12-29 ENCOUNTER — Other Ambulatory Visit: Payer: Self-pay | Admitting: Family Medicine

## 2015-12-29 ENCOUNTER — Other Ambulatory Visit: Payer: Self-pay | Admitting: Cardiovascular Disease

## 2016-01-01 DIAGNOSIS — S62001D Unspecified fracture of navicular [scaphoid] bone of right wrist, subsequent encounter for fracture with routine healing: Secondary | ICD-10-CM | POA: Diagnosis not present

## 2016-01-01 DIAGNOSIS — G5602 Carpal tunnel syndrome, left upper limb: Secondary | ICD-10-CM | POA: Diagnosis not present

## 2016-01-26 ENCOUNTER — Other Ambulatory Visit: Payer: Self-pay | Admitting: Family Medicine

## 2016-01-28 ENCOUNTER — Other Ambulatory Visit: Payer: Self-pay | Admitting: Cardiovascular Disease

## 2016-01-28 MED ORDER — DILTIAZEM HCL ER COATED BEADS 180 MG PO CP24: 180.0000 mg | ORAL_CAPSULE | Freq: Every day | ORAL | 8 refills | Status: DC

## 2016-02-11 ENCOUNTER — Other Ambulatory Visit: Payer: Self-pay | Admitting: Cardiovascular Disease

## 2016-02-11 ENCOUNTER — Other Ambulatory Visit: Payer: Self-pay | Admitting: Family Medicine

## 2016-02-17 DIAGNOSIS — M654 Radial styloid tenosynovitis [de Quervain]: Secondary | ICD-10-CM | POA: Diagnosis not present

## 2016-03-01 ENCOUNTER — Other Ambulatory Visit: Payer: Self-pay | Admitting: Cardiovascular Disease

## 2016-03-22 ENCOUNTER — Other Ambulatory Visit: Payer: Self-pay | Admitting: Family Medicine

## 2016-04-01 DIAGNOSIS — Z23 Encounter for immunization: Secondary | ICD-10-CM | POA: Diagnosis not present

## 2016-04-04 ENCOUNTER — Other Ambulatory Visit: Payer: Self-pay | Admitting: Family Medicine

## 2016-04-06 DIAGNOSIS — B351 Tinea unguium: Secondary | ICD-10-CM | POA: Diagnosis not present

## 2016-04-11 ENCOUNTER — Other Ambulatory Visit: Payer: Self-pay | Admitting: Family Medicine

## 2016-04-18 ENCOUNTER — Encounter: Payer: Self-pay | Admitting: Podiatry

## 2016-04-18 ENCOUNTER — Ambulatory Visit (INDEPENDENT_AMBULATORY_CARE_PROVIDER_SITE_OTHER): Payer: Medicare Other | Admitting: Podiatry

## 2016-04-18 DIAGNOSIS — M79675 Pain in left toe(s): Secondary | ICD-10-CM

## 2016-04-18 DIAGNOSIS — L601 Onycholysis: Secondary | ICD-10-CM

## 2016-04-18 DIAGNOSIS — I25119 Atherosclerotic heart disease of native coronary artery with unspecified angina pectoris: Secondary | ICD-10-CM | POA: Diagnosis not present

## 2016-04-18 DIAGNOSIS — L603 Nail dystrophy: Secondary | ICD-10-CM

## 2016-04-18 NOTE — Patient Instructions (Signed)
Betadine Soak Instructions  Purchase an 8 oz. bottle of BETADINE solution (Povidone)  THE DAY AFTER THE PROCEDURE  Place 1 tablespoon of betadine solution in a quart of warm tap water.  Submerge your foot or feet with outer bandage intact for the initial soak; this will allow the bandage to become moist and wet for easy lift off.  Once you remove your bandage, continue to soak in the solution for 20 minutes.  This soak should be done twice a day.  Next, remove your foot or feet from solution, blot dry the affected area and cover.  You may use a band aid large enough to cover the area or use gauze and tape.  Apply other medications to the area as directed by the doctor such as cortisporin otic solution (ear drops) or neosporin.   IF YOUR SKIN BECOMES IRRITATED WHILE USING THESE INSTRUCTIONS, IT IS OKAY TO SWITCH TO EPSOM SALTS AND WATER.   Place 1/4 cup of epsom salts in a quart of warm tap water.  Submerge your foot or feet with outer bandage intact for the initial soak; this will allow the bandage to become moist and wet for easy lift off.  Once you remove your bandage, continue to soak in the solution for 20 minutes.  This soak should be done twice a day.  Next, remove your foot or feet from solution, blot dry the affected area and cover.  You may use a band aid large enough to cover the area or use gauze and tape.  Apply other medications to the area as directed by the doctor such as polysporin neosporin.  IF YOUR SKIN BECOMES IRRITATED WHILE USING THESE INSTRUCTIONS, IT IS OKAY TO SWITCH TO  WHITE VINEGAR AND WATER.    Long Term Care Instructions-Post Nail Surgery  You have had your ingrown toenail and root treated with a chemical.  This chemical causes a burn that will drain and ooze like a blister.  This can drain for 6-8 weeks or longer.  It is important to keep this area clean, covered, and follow the soaking instructions dispensed at the time of your surgery.  This area will eventually dry  and form a scab.  Once the scab forms you no longer need to soak or apply a dressing.  If at any time you experience an increase in pain, redness, swelling, or drainage, you should contact the office as soon as possible.   Monitor for any signs/symptoms of infection. Call the office immediately if any occur or go directly to the emergency room. Call with any questions/concerns.  

## 2016-04-22 ENCOUNTER — Telehealth: Payer: Self-pay | Admitting: *Deleted

## 2016-04-22 NOTE — Telephone Encounter (Signed)
Patient's daughter called back and stated that the toe is not draining and wanted to know do they still need to soak the toe and I stated that they did not need to unless it was draining and we would see them on 05/02/16 and to call the office if any concerns or questions. Lattie Haw

## 2016-04-22 NOTE — Telephone Encounter (Signed)
Called and left a message with the patient's daughter [name] and stated to call the office with her concerns. [name]

## 2016-04-26 ENCOUNTER — Other Ambulatory Visit: Payer: Self-pay | Admitting: Family Medicine

## 2016-05-01 NOTE — Progress Notes (Signed)
Subjective: Patient presents today as a new patient for evaluation of pain to the left great toenail. Patient states the pain is constant and she is unable to walk with shoe gear without pain. Patient complains of a thickened, yellow, discolored toenail to the left great toe. Patient states the pains been ongoing greater than 1 month. Patient presents today for further treatment and evaluation  Objective:  General: Well developed, nourished, in no acute distress, alert and oriented x3   Dermatology: Hyperkeratotic, elongated, discolored, onychodystrophy nail noted to the left great toe. This toenail is painful with direct compression. There is partial detachment of the distal 50% of the toenail noted. Periungual nail folds appear to be minimally inflamed.  Vascular: Dorsalis Pedis artery and Posterior Tibial artery pedal pulses palpable. No lower extremity edema noted.   Neruologic: Grossly intact via light touch bilateral.  Musculoskeletal: Muscular strength within normal limits in all groups bilateral. Normal range of motion noted to all pedal and ankle joints.   Assesement: #1 onychodystrophy nail left great toe #2 pain in left great toenail  Plan of Care:  1. Patient evaluated.  2. Discussed treatment alternatives and plan of care. Explained nail avulsion procedure and post procedure course to patient. 3. Patient opted for permanent total nail avulsion.  4. Prior to procedure, local anesthesia infiltration utilized using 3 ml of a 50:50 mixture of 2% plain lidocaine and 0.5% plain marcaine in a normal hallux block fashion and a betadine prep performed.  5. Total permanent nail avulsion with chemical matrixectomy performed using [id number] applications of phenol followed by alcohol flush.  6. Light dressing applied. 7. Return to clinic in 2 weeks.   Dr. Edrick Kins [facility]

## 2016-05-02 ENCOUNTER — Ambulatory Visit (INDEPENDENT_AMBULATORY_CARE_PROVIDER_SITE_OTHER): Payer: Medicare Other | Admitting: Podiatry

## 2016-05-02 DIAGNOSIS — S91209D Unspecified open wound of unspecified toe(s) with damage to nail, subsequent encounter: Secondary | ICD-10-CM | POA: Diagnosis not present

## 2016-05-02 DIAGNOSIS — S91109D Unspecified open wound of unspecified toe(s) without damage to nail, subsequent encounter: Secondary | ICD-10-CM

## 2016-05-02 DIAGNOSIS — M79676 Pain in unspecified toe(s): Secondary | ICD-10-CM

## 2016-05-05 ENCOUNTER — Encounter: Payer: Self-pay | Admitting: Physician Assistant

## 2016-05-08 NOTE — Progress Notes (Signed)
Subjective: Patient presents today 2 weeks post total permanent nail avulsion procedure. Patient states that the toe and nail fold is feeling much better.  Objective: Skin is warm, dry and supple. Nail and respective nail fold appears to be healing appropriately. Open wound to the associated nail fold with a granular wound base and moderate amount of fibrotic tissue. Minimal drainage noted. Mild erythema around the periungual region likely due to phenol chemical matricectomy.  Assessment: #1 postop permanent total nail avulsion #2 open wound periungual nail fold of respective digit.  #3 open wound of toe secondary to total nail avulsion  Plan of care: #1 patient was evaluated  #2 debridement of open wound was performed to the periungual border of the respective toe using a currette. Antibiotic ointment and Band-Aid was applied. #3 patient is to return to clinic on a PRN  basis.

## 2016-05-09 ENCOUNTER — Other Ambulatory Visit: Payer: Self-pay | Admitting: Cardiovascular Disease

## 2016-05-16 ENCOUNTER — Encounter: Payer: Self-pay | Admitting: Physician Assistant

## 2016-05-16 DIAGNOSIS — I2510 Atherosclerotic heart disease of native coronary artery without angina pectoris: Secondary | ICD-10-CM | POA: Insufficient documentation

## 2016-05-16 DIAGNOSIS — I371 Nonrheumatic pulmonary valve insufficiency: Secondary | ICD-10-CM | POA: Insufficient documentation

## 2016-05-16 DIAGNOSIS — I25810 Atherosclerosis of coronary artery bypass graft(s) without angina pectoris: Secondary | ICD-10-CM | POA: Insufficient documentation

## 2016-05-16 DIAGNOSIS — I5189 Other ill-defined heart diseases: Secondary | ICD-10-CM | POA: Insufficient documentation

## 2016-05-16 DIAGNOSIS — I208 Other forms of angina pectoris: Secondary | ICD-10-CM | POA: Insufficient documentation

## 2016-05-16 DIAGNOSIS — I5032 Chronic diastolic (congestive) heart failure: Secondary | ICD-10-CM | POA: Insufficient documentation

## 2016-05-16 DIAGNOSIS — I071 Rheumatic tricuspid insufficiency: Secondary | ICD-10-CM | POA: Insufficient documentation

## 2016-05-16 DIAGNOSIS — I350 Nonrheumatic aortic (valve) stenosis: Secondary | ICD-10-CM | POA: Insufficient documentation

## 2016-05-16 NOTE — Progress Notes (Signed)
Cardiology Office Note    Date:  05/17/2016  ID:  [name], DOB [birth year], MRN [id number] PCP:  Wyatt Haste, MD  Cardiologist:  Angelena Form   Chief Complaint: f/u SOB, heart disease  History of Present Illness:  [name] is a 82 y.o. female with history of CAD s/p CABG September 2006 (Dr. Prescott Gum) as well as a Cox-Maze procedure for atrial fibrillation (patient refuses anticoagulation), CVA, carotid artery stenosis s/p R CEA, fibromyalgia, GERD, hiatal hernia, severe kyphoscoloisis, valvular heart disease (severe AS, moderate MR/TR/PR) who presents for f/u. Carotid artery dopplers in 2015 were stable (she has refused repeat testing). She has history of atypical chest pain as well as chronic stable angina since her bypass with negative nuc in 2010 as well as 10/2014. She has seen Dr. Collene Mares with GI and had a swallowing study for "choking sensation". She has seen an ENT as well. She was admitted to [facility] December 2015 with pneumonia and atrial fib with RVR. Her Toprol and Diltiazem were increased for rate control. Her Plavix was stopped and she was placed on Eliquis with plans for an OP cardioversion. After discharge she developed gross hematuria and stopped the Eliquis and stated that she was not interested in going back on anticoagulation. She was started back on Plavix. In 11/2015 she was admitted for back pain/SOB - the back pain was felt possibly MSK due to cervical disc disease. She was also hypotensive and bradycardic, prompting adjustment of her AVN blocking agents. Hgb 12.2, trops negative, Cr 0.61. Echo during that admission (11/2015): EF 60-65%, hypokinesis of inferior myocardium, grade 3 DD, severe aortic stenosis, moderate mitral regurg/tricuspid regurg/pulmonic regurg, PASP 45.   She presents back to clinic today for routine follow-up with her daughter. Overall she feels stable. She has a lot of chronic complaints including chronic shoulder issues, a broken hand that occurred  over the summer, and carpal tunnel. She states she is proud that she's still alive after everything she's been through. She has occasional brief chest pains in different places on her chest but no recent change in this. She does note exertional dyspnea when walking from one room to another, but says this is something she's "learned to live with."  Past Medical History:  Diagnosis Date  . Arthritis    OSTEO  . Aspirin allergy    on Plavix  . Asthma   . Bronchitis   . CAD (coronary artery disease)    a. s/p CABG 2006 with Cox-Maze procedure.  . Carotid artery disease ([place])    a. s/p R CEA.  . Chronic stable angina ([place])   . Diastolic dysfunction   . Eczema   . Fibromyalgia   . GERD (gastroesophageal reflux disease)   . Heart murmur   . Hiatal hernia   . Hyperlipidemia   . Hypertension   . Kyphoscoliosis   . Mitral regurgitation   . Myocardial infarction 2003   Stent to CFX  . PAF (paroxysmal atrial fibrillation) ([place])    a. pt has h/o hematuria on Eliquis and has since refused anticoagulation  . Pneumonia 2015 ?  Marland Kitchen Pulmonary regurgitation   . Severe aortic stenosis   . Skin cancer of arm   . Stroke ([place])   . Tricuspid regurgitation     Past Surgical History:  Procedure Laterality Date  . ABDOMINAL HYSTERECTOMY  1976  . CARDIAC SURGERY  stents  . CAROTID ENDARTERECTOMY  2010  . CORONARY ARTERY BYPASS GRAFT  01/2005  LIMA-D1; SVG-LAD; SVG-OM; SVG-PDA  . TONSILLECTOMY    . TUMOR REMOVAL      Current Medications: Current Outpatient Prescriptions  Medication Sig Dispense Refill  . calcium-vitamin D (OSCAL WITH D) 500-200 MG-UNIT per tablet Take 1 tablet by mouth daily with breakfast.    . cholecalciferol (VITAMIN D) 1000 UNITS tablet Take 1,000 Units by mouth daily.    . clopidogrel (PLAVIX) 75 MG tablet take 1 tablet by mouth once daily 30 tablet 5  . diazepam (VALIUM) 2 MG tablet Take 2 mg by mouth every 6 (six) hours as needed (dizziness). Reported on 12/08/2015      . diltiazem (CARDIZEM CD) 180 MG 24 hr capsule take 1 capsule by mouth once daily 30 capsule 3  . fluticasone (FLONASE) 50 MCG/ACT nasal spray instill 2 sprays into each nostril once daily 16 g 3  . fluticasone (FLOVENT HFA) 110 MCG/ACT inhaler inhale 1 puff by mouth twice a day 12 g 11  . guaiFENesin (MUCINEX) 600 MG 12 hr tablet Take 1 tablet (600 mg total) by mouth 2 (two) times daily. 30 tablet 0  . guaiFENesin (ROBITUSSIN) 100 MG/5ML liquid Take 100 mg by mouth 3 (three) times daily as needed for cough. Reported on 12/08/2015    . isosorbide dinitrate (ISORDIL) 30 MG tablet Take 1 tablet (30 mg total) by mouth 2 (two) times daily. 60 tablet 11  . loratadine (CLARITIN) 10 MG tablet Take 10 mg by mouth daily.      . metoprolol tartrate (LOPRESSOR) 25 MG tablet TAKE 1/2 TABLET BY MOUTH 2 TIMES DAILY 60 tablet 1  . nitroGLYCERIN (NITROSTAT) 0.4 MG SL tablet PLACE 1 TABLET UNDER THE TONGUE EVERY 5 MINUTES AS NEEDED FOR CHEST PAIN (MAXIMUM OF 3 TABS) 25 tablet 1  . omeprazole (PRILOSEC) 20 MG capsule take 2 capsules by mouth daily 60 capsule 5  . rosuvastatin (CRESTOR) 40 MG tablet take 1 tablet by mouth once daily 90 tablet 1  . traMADol (ULTRAM) 50 MG tablet take 2 tablets by mouth three times a day if needed for pain 60 tablet 3  . valsartan (DIOVAN) 40 MG tablet take 1 tablet by mouth once daily 90 tablet 0  . VENTOLIN HFA 108 (90 Base) MCG/ACT inhaler INHALE 2 PUFFS BY MOUTH EVERY 6 HOURS AS NEEDED FOR WHEEZING OR SHORTNESS OF BREATH 18 Inhaler 5   No current facility-administered medications for this visit.      Allergies:   Bactrim [sulfamethoxazole-trimethoprim]; Amitriptyline hcl; Aspirin; and Zetia [ezetimibe]   Social History   Social History  . Marital status: Widowed    Spouse name: N/A  . Number of children: 3  . Years of education: N/A   Occupational History  .  Retired   Social History Main Topics  . Smoking status: Never Smoker  . Smokeless tobacco: Former Systems developer     Types: Snuff    Quit date: 05/23/2001  . Alcohol use No  . Drug use: No  . Sexual activity: Not Currently   Other Topics Concern  . None   Social History Narrative  . None     Family History:  The patient's family history includes Heart failure in her mother; Other in her father.   ROS:   Please see the history of present illness.  All other systems are reviewed and otherwise negative.    PHYSICAL EXAM:   VS:  BP (!) 122/52   Pulse 64   Ht 5\' 5"  (1.651 m)   Wt 136 lb  1.9 oz (61.7 kg)   BMI 22.65 kg/m   BMI: Body mass index is 22.65 kg/m. GEN: Well nourished, well developed elderly WF in no acute distress  HEENT: normocephalic, atraumatic Neck: no JVD, carotid bruits, or masses Cardiac: RRR, 2/6 SEM RUSB, diminished S2, no rubs or gallops, no edema  Respiratory:  clear to auscultation bilaterally, normal work of breathing GI: soft, nontender, nondistended, + BS MS: kyphotic posture, bilateral hands in soft splints Skin: warm and dry, no rash  Neuro:  Alert and Oriented x 3, Strength and sensation are intact, follows commands Psych: euthymic mood, full affect  Wt Readings from Last 3 Encounters:  05/17/16 136 lb 1.9 oz (61.7 kg)  12/08/15 134 lb (60.8 kg)  12/01/15 134 lb 3.2 oz (60.9 kg)      Studies/Labs Reviewed:   EKG:  EKG was ordered today and personally reviewed by me and demonstrates NSR 64bpm 1st degree AVB otherwise unremarkable.  Recent Labs: 08/21/2015: ALT 10 11/30/2015: BUN 15; Potassium 4.0; Sodium 140 12/01/2015: Creatinine, Ser 0.48; Hemoglobin 12.2; Platelets 177   Lipid Panel    Component Value Date/Time   CHOL 144 08/21/2015 0001   TRIG 87 08/21/2015 0001   HDL 73 08/21/2015 0001   CHOLHDL 2.0 08/21/2015 0001   VLDL 17 08/21/2015 0001   LDLCALC 54 08/21/2015 0001    Additional studies/ records that were reviewed today include: Summarized above.    ASSESSMENT & PLAN:   1. CAD with chronic angina - stable. She is maintained on  Plavix. Will switch Prilosec to equivalent dose of Protonix to reduce chance of interaction. 2. Valvular heart disease with severe AS & moderate TR, MR, PR - we reviewed her echo results today, as well as the fact that her AV has progressed to severe stenosis. I broached the idea of TAVR with her given her exertional dyspnea. She recently had a friend die from surgery and has been traditionally hesitant to undergo any procedures "unless absolutely necessary." She would like to table this for now and revisit in the spring. I will arrange for her to see Dr. Angelena Form back in March. 3. Paroxysmal atrial fib - maintaining NSR on present regimen, no further symptoms of bradycardia. Not on anticoagulation as above. She had hematuria on Eliquis and Xarelto and also reports being "incredibly accident prone." 4. Diastolic dysfunction - appears euvolemic. Continue current regimen. 5. Carotid disease - she declines recurrent duplex testing at this time.  Disposition: F/u with Dr. Angelena Form in March 2017.   Medication Adjustments/Labs and Tests Ordered: Current medicines are reviewed at length with the patient today.  Concerns regarding medicines are outlined above. Medication changes, Labs and Tests ordered today are summarized above and listed in the Patient Instructions accessible in Encounters.   Raechel Ache PA-C  05/17/2016 9:59 AM    [facility] [address], [place], [place]  [address] Phone: [phone]; Fax: [phone]

## 2016-05-17 ENCOUNTER — Ambulatory Visit (INDEPENDENT_AMBULATORY_CARE_PROVIDER_SITE_OTHER): Payer: Medicare Other | Admitting: Physician Assistant

## 2016-05-17 ENCOUNTER — Encounter: Payer: Self-pay | Admitting: Physician Assistant

## 2016-05-17 VITALS — BP 122/52 | HR 64 | Ht 65.0 in | Wt 136.1 lb

## 2016-05-17 DIAGNOSIS — I350 Nonrheumatic aortic (valve) stenosis: Secondary | ICD-10-CM | POA: Diagnosis not present

## 2016-05-17 DIAGNOSIS — I480 Paroxysmal atrial fibrillation: Secondary | ICD-10-CM

## 2016-05-17 DIAGNOSIS — I2510 Atherosclerotic heart disease of native coronary artery without angina pectoris: Secondary | ICD-10-CM | POA: Diagnosis not present

## 2016-05-17 DIAGNOSIS — I209 Angina pectoris, unspecified: Secondary | ICD-10-CM

## 2016-05-17 DIAGNOSIS — I071 Rheumatic tricuspid insufficiency: Secondary | ICD-10-CM

## 2016-05-17 DIAGNOSIS — I519 Heart disease, unspecified: Secondary | ICD-10-CM

## 2016-05-17 DIAGNOSIS — I779 Disorder of arteries and arterioles, unspecified: Secondary | ICD-10-CM

## 2016-05-17 DIAGNOSIS — I5189 Other ill-defined heart diseases: Secondary | ICD-10-CM

## 2016-05-17 DIAGNOSIS — I340 Nonrheumatic mitral (valve) insufficiency: Secondary | ICD-10-CM

## 2016-05-17 DIAGNOSIS — I371 Nonrheumatic pulmonary valve insufficiency: Secondary | ICD-10-CM

## 2016-05-17 DIAGNOSIS — I739 Peripheral vascular disease, unspecified: Secondary | ICD-10-CM

## 2016-05-17 DIAGNOSIS — I208 Other forms of angina pectoris: Secondary | ICD-10-CM | POA: Diagnosis not present

## 2016-05-17 MED ORDER — PANTOPRAZOLE SODIUM 40 MG PO TBEC: 40.0000 mg | DELAYED_RELEASE_TABLET | Freq: Two times a day (BID) | ORAL | 3 refills | Status: DC

## 2016-05-17 NOTE — Patient Instructions (Addendum)
Medication Instructions:  Your physician has recommended you make the following change in your medication:  1.  STOP the Omeprazole 2.  START the Protonix 40 mg taking 1 tablet twice a day  Labwork: None ordered  Testing/Procedures: None ordered  Follow-Up: Your physician recommends that you schedule a follow-up appointment in: 3 MONTHS WITH DR. Angelena Form   Any Other Special Instructions Will Be Listed Below (If Applicable).    If you need a refill on your cardiac medications before your next appointment, please call your pharmacy.

## 2016-05-31 ENCOUNTER — Other Ambulatory Visit: Payer: Self-pay | Admitting: Cardiovascular Disease

## 2016-07-26 ENCOUNTER — Other Ambulatory Visit: Payer: Self-pay | Admitting: Family Medicine

## 2016-08-04 ENCOUNTER — Encounter: Payer: Self-pay | Admitting: Cardiovascular Disease

## 2016-08-09 ENCOUNTER — Other Ambulatory Visit: Payer: Self-pay | Admitting: Cardiovascular Disease

## 2016-08-15 ENCOUNTER — Ambulatory Visit: Payer: Medicare Other | Admitting: Cardiovascular Disease

## 2016-08-24 ENCOUNTER — Other Ambulatory Visit: Payer: Self-pay | Admitting: Family Medicine

## 2016-08-29 ENCOUNTER — Other Ambulatory Visit: Payer: Self-pay | Admitting: Cardiovascular Disease

## 2016-09-12 ENCOUNTER — Other Ambulatory Visit: Payer: Self-pay | Admitting: Cardiovascular Disease

## 2016-09-22 ENCOUNTER — Other Ambulatory Visit: Payer: Self-pay | Admitting: Family Medicine

## 2016-09-27 ENCOUNTER — Other Ambulatory Visit: Payer: Self-pay | Admitting: Family Medicine

## 2016-09-28 NOTE — Telephone Encounter (Signed)
Is this okay to refill? 

## 2016-09-30 ENCOUNTER — Ambulatory Visit (INDEPENDENT_AMBULATORY_CARE_PROVIDER_SITE_OTHER): Payer: Medicare Other | Admitting: Cardiovascular Disease

## 2016-09-30 ENCOUNTER — Encounter: Payer: Self-pay | Admitting: Cardiovascular Disease

## 2016-09-30 VITALS — BP 118/70 | HR 65 | Ht 65.0 in | Wt 130.0 lb

## 2016-09-30 DIAGNOSIS — I350 Nonrheumatic aortic (valve) stenosis: Secondary | ICD-10-CM | POA: Diagnosis not present

## 2016-09-30 DIAGNOSIS — I25119 Atherosclerotic heart disease of native coronary artery with unspecified angina pectoris: Secondary | ICD-10-CM

## 2016-09-30 DIAGNOSIS — I480 Paroxysmal atrial fibrillation: Secondary | ICD-10-CM

## 2016-09-30 DIAGNOSIS — I209 Angina pectoris, unspecified: Secondary | ICD-10-CM | POA: Diagnosis not present

## 2016-09-30 DIAGNOSIS — I779 Disorder of arteries and arterioles, unspecified: Secondary | ICD-10-CM | POA: Diagnosis not present

## 2016-09-30 DIAGNOSIS — I739 Peripheral vascular disease, unspecified: Secondary | ICD-10-CM

## 2016-09-30 DIAGNOSIS — I340 Nonrheumatic mitral (valve) insufficiency: Secondary | ICD-10-CM | POA: Diagnosis not present

## 2016-09-30 MED ORDER — NITROGLYCERIN 0.4 MG SL SUBL: SUBLINGUAL_TABLET | SUBLINGUAL | 6 refills | Status: DC

## 2016-09-30 NOTE — Patient Instructions (Signed)
Medication Instructions:  Your physician recommends that you continue on your current medications as directed. Please refer to the Current Medication list given to you today.   Labwork: none  Testing/Procedures: Your physician has requested that you have an echocardiogram. Echocardiography is a painless test that uses sound waves to create images of your heart. It provides your doctor with information about the size and shape of your heart and how well your heart's chambers and valves are working. This procedure takes approximately one hour. There are no restrictions for this procedure.    Follow-Up: Your physician recommends that you schedule a follow-up appointment in: about 3-4 weeks. Scheduled for June 4,2018 at 12:00    Any Other Special Instructions Will Be Listed Below (If Applicable).     If you need a refill on your cardiac medications before your next appointment, please call your pharmacy.

## 2016-09-30 NOTE — Progress Notes (Signed)
Chief Complaint  Patient presents with  . Follow-up    History of Present Illness: 83 yo female with history of CAD s/p CABG, atrial fib s/p MAZE procedure, CVA, carotid artery disease s/p right CEA, fibromyalgia, GERD, moderate MR, severe AS who is here today for follow up. She underwent CABG in 2006 with MAZE procedure. She is known to have moderate carotid disease by last dopplers in October 2015 but she has refused repeat testing. Last echo July 2017 with normal LV function, severe AS (mean gradient 47 mmHg), moderate MR. She had recurrent atrial fib in 2015 in the setting of pneumonia and was started on Eliquis but she developed severe hematuria and stopped the Eliquis. She refused to restart Eliquis and restarted Plavix. Negative nuclear stress test at [facility] June 2016 during admission for chest pain. [name] is here today for follow up. She notes dizziness and blurred vision but no syncope. She has chest pain several times per week and this resolves with NTG. She has dyspnea with minimal exertion. She had LE edema during the day that resolves at night. No palpitations. She does not wish to start anti-coagulation. She has chronic back pain and chronic diffuse joint aches.    Primary Care Physician: Denita Lung, MD   Past Medical History:  Diagnosis Date  . Arthritis    OSTEO  . Aspirin allergy    on Plavix  . Asthma   . Bronchitis   . CAD (coronary artery disease)    a. s/p CABG 2006 with Cox-Maze procedure.  . Carotid artery disease ([place])    a. s/p R CEA.  . Chronic stable angina ([place])   . Diastolic dysfunction   . Eczema   . Fibromyalgia   . GERD (gastroesophageal reflux disease)   . Heart murmur   . Hiatal hernia   . Hyperlipidemia   . Hypertension   . Kyphoscoliosis   . Mitral regurgitation   . Myocardial infarction [facility]) 2003   Stent to CFX  . PAF (paroxysmal atrial fibrillation) ([place])    a. pt has h/o hematuria on Eliquis and has since refused anticoagulation    . Pneumonia 2015 ?  Marland Kitchen Pulmonary regurgitation   . Severe aortic stenosis   . Skin cancer of arm   . Stroke ([place])   . Tricuspid regurgitation     Past Surgical History:  Procedure Laterality Date  . ABDOMINAL HYSTERECTOMY  1976  . CARDIAC SURGERY  stents  . CAROTID ENDARTERECTOMY  2010  . CORONARY ARTERY BYPASS GRAFT  01/2005   LIMA-D1; SVG-LAD; SVG-OM; SVG-PDA  . TONSILLECTOMY    . TUMOR REMOVAL      Current Outpatient Prescriptions  Medication Sig Dispense Refill  . calcium-vitamin D (OSCAL WITH D) 500-200 MG-UNIT per tablet Take 1 tablet by mouth daily with breakfast.    . cholecalciferol (VITAMIN D) 1000 UNITS tablet Take 1,000 Units by mouth daily.    . clopidogrel (PLAVIX) 75 MG tablet TAKE 1 TABLET BY MOUTH ONCE DAILY 30 tablet 8  . diazepam (VALIUM) 2 MG tablet Take 2 mg by mouth every 6 (six) hours as needed (dizziness). Reported on 12/08/2015    . diltiazem (CARDIZEM CD) 180 MG 24 hr capsule take 1 capsule by mouth once daily 30 capsule 3  . FLOVENT HFA 110 MCG/ACT inhaler inhale 1 puff by mouth twice a day 12 g 1  . fluticasone (FLONASE) 50 MCG/ACT nasal spray instill 2 sprays into each nostril once daily 16  g 3  . guaiFENesin (MUCINEX) 600 MG 12 hr tablet Take 1 tablet (600 mg total) by mouth 2 (two) times daily. 30 tablet 0  . guaiFENesin (ROBITUSSIN) 100 MG/5ML liquid Take 100 mg by mouth 3 (three) times daily as needed for cough. Reported on 12/08/2015    . isosorbide dinitrate (ISORDIL) 30 MG tablet TAKE 1 TABLET BY MOUTH 2 TIMES DAILY 60 tablet 7  . loratadine (CLARITIN) 10 MG tablet Take 10 mg by mouth daily.      . metoprolol tartrate (LOPRESSOR) 25 MG tablet TAKE 1/2 TABLET BY MOUTH 2 TIMES DAILY 60 tablet 0  . nitroGLYCERIN (NITROSTAT) 0.4 MG SL tablet place 1 tablet UNDER THE TONGUE EVERY 5 MINUTES if needed for chest pain MAX OF 3 TABS 25 tablet 6  . pantoprazole (PROTONIX) 40 MG tablet Take 1 tablet (40 mg total) by mouth 2 (two) times daily. 180 tablet 3  .  rosuvastatin (CRESTOR) 40 MG tablet take 1 tablet by mouth once daily 90 tablet 1  . traMADol (ULTRAM) 50 MG tablet take 2 tablets by mouth three times a day if needed for pain 60 tablet 3  . valsartan (DIOVAN) 40 MG tablet TAKE 1 TABLET BY MOUTH ONCE DAILY 90 tablet 3  . VENTOLIN HFA 108 (90 Base) MCG/ACT inhaler INHALE 2 PUFFS BY MOUTH EVERY 6 HOURS AS NEEDED FOR WHEEZING OR SHORTNESS OF BREATH 18 Inhaler 5   No current facility-administered medications for this visit.     Allergies  Allergen Reactions  . Bactrim [Sulfamethoxazole-Trimethoprim] Other (See Comments)    Makes her very strange   . Amitriptyline Hcl Rash  . Aspirin Swelling and Rash  . Zetia [Ezetimibe] Rash    Social History   Social History  . Marital status: Widowed    Spouse name: N/A  . Number of children: 3  . Years of education: N/A   Occupational History  .  Retired   Social History Main Topics  . Smoking status: Never Smoker  . Smokeless tobacco: Former Systems developer    Types: Snuff    Quit date: 05/23/2001  . Alcohol use No  . Drug use: No  . Sexual activity: Not Currently   Other Topics Concern  . Not on file   Social History Narrative  . No narrative on file    Family History  Problem Relation Age of Onset  . Heart failure Mother   . Other Father     Review of Systems:  As stated in the HPI and otherwise negative.   BP 118/70   Pulse 65   Ht 5\' 5"  (1.651 m)   Wt 130 lb (59 kg)   SpO2 98%   BMI 21.63 kg/m   Physical Examination: General: Well developed, well nourished, NAD  HEENT: OP clear, mucus membranes moist  SKIN: warm, dry. No rashes. Neuro: No focal deficits  Musculoskeletal: Muscle strength 5/5 all ext  Psychiatric: Mood and affect normal  Neck: No JVD, no carotid bruits, no thyromegaly, no lymphadenopathy.  Lungs:Clear bilaterally, no wheezes, rhonci, crackles Cardiovascular: Regular rate and rhythm. Loud harsh systolic murmur. No gallops or rubs. Abdomen:Soft. Bowel  sounds present. Non-tender.  Extremities: No lower extremity edema. Pulses are 2 + in the bilateral DP/PT.  Echo July 2017: Left ventricle: The cavity size was normal. Wall thickness was   normal. Systolic function was normal. The estimated ejection   fraction was in the range of 60% to 65%. There is hypokinesis of   the inferior myocardium.  Doppler parameters are consistent with a   reversible restrictive pattern, indicative of decreased left   ventricular diastolic compliance and/or increased left atrial   pressure (grade 3 diastolic dysfunction). - Aortic valve: Valve mobility was restricted. There was severe   stenosis. Peak velocity (S): 446 cm/s. Mean gradient (S): 47 mm   Hg. - Mitral valve: Mildly thickened, mildly calcified leaflets . There   was moderate regurgitation directed posteriorly. - Right atrium: The atrium was mildly dilated. - Tricuspid valve: There was moderate regurgitation. - Pulmonic valve: There was moderate regurgitation. - Pulmonary arteries: Systolic pressure was mildly to moderately   increased. PA peak pressure: 45 mm Hg (S).  Impressions:  - Aortic stenosis has advanced when compared to prior echo.   EKG:  EKG is not ordered today. The ekg ordered today demonstrates  Recent Labs: 11/30/2015: BUN 15; Potassium 4.0; Sodium 140 12/01/2015: Creatinine, Ser 0.48; Hemoglobin 12.2; Platelets 177   Lipid Panel    Component Value Date/Time   CHOL 144 08/21/2015 0001   TRIG 87 08/21/2015 0001   HDL 73 08/21/2015 0001   CHOLHDL 2.0 08/21/2015 0001   VLDL 17 08/21/2015 0001   LDLCALC 54 08/21/2015 0001     Wt Readings from Last 3 Encounters:  09/30/16 130 lb (59 kg)  05/17/16 136 lb 1.9 oz (61.7 kg)  12/08/15 134 lb (60.8 kg)     Other studies Reviewed: Additional studies/ records that were reviewed today include: . Review of the above records demonstrates:    Assessment and Plan:   1. Carotid artery disease: She has moderate carotid  disease but does not wish to have further testing.    2. Mitral regurgitation: Moderate by echo July 2017.    3. CAD with stable angina: She has chronic stable angina. Will continue Plavix, Imdur, statin, metoprolol and diltiazem.   4. AORTIC STENOSIS: Severe by echo July 2017. She has been reluctant to consider TAVR. I have spent 25 minutes today discussing the TAVR procedure. She would not be a surgical AVR candidate. She is willing to consider TAVR. I will arrange a repeat echo now to assess her valve and LV function. I will see her back in 2-3 weeks to discuss further. She will review the literature I have given her in the meantime.   5. Atrial fibrillation,paroxysmal: She appears to be in sinus. Will continue metoprolol and Cardizem. She does not wish to consider anti-coagulation.   Current medicines are reviewed at length with the patient today.  The patient does not have concerns regarding medicines.  The following changes have been made:  no change  Labs/ tests ordered today include:   Orders Placed This Encounter  Procedures  . ECHOCARDIOGRAM COMPLETE     Disposition:   FU with me in 3 weeks.    Signed, Lauree Chandler, MD 09/30/2016 1:25 PM    [facility] [address], [place], [place]  [address] Phone: [phone]; Fax: [phone]

## 2016-10-14 ENCOUNTER — Ambulatory Visit (HOSPITAL_COMMUNITY): Payer: Medicare Other | Attending: Cardiology

## 2016-10-14 ENCOUNTER — Other Ambulatory Visit: Payer: Self-pay

## 2016-10-14 DIAGNOSIS — I350 Nonrheumatic aortic (valve) stenosis: Secondary | ICD-10-CM | POA: Diagnosis not present

## 2016-10-14 LAB — ECHOCARDIOGRAM COMPLETE
AOPV: 0.14 m/s
AV Area VTI index: 0.27 cm2/m2
AV Area VTI: 0.44 cm2
AV Peak grad: 101 mmHg
AV VEL mean LVOT/AV: 0.14
AV pk vel: 502 cm/s
AV vel: 0.45
AVAREAMEANV: 0.44 cm2
AVAREAMEANVIN: 0.27 cm2/m2
AVG: 54 mmHg
Area-P 1/2: 3.38 cm2
CHL CUP AV PEAK INDEX: 0.27
CHL CUP DOP CALC LVOT VTI: 20 cm
DOP CAL AO MEAN VELOCITY: 341 cm/s
E decel time: 222 msec
E/e' ratio: 24
FS: 24 % — AB (ref 28–44)
IV/PV OW: 1.22
LA ID, A-P, ES: 32 mm
LA diam end sys: 32 mm
LA vol A4C: 50.1 ml
LADIAMINDEX: 1.94 cm/m2
LV E/e'average: 24
LVEEMED: 24
LVELAT: 6.25 cm/s
LVOT area: 3.14 cm2
LVOT diameter: 20 mm
LVOTPV: 71.1 cm/s
LVOTSV: 63 mL
LVOTVTI: 0.14 cm
MV Dec: 222
MV Peak grad: 9 mmHg
MV pk E vel: 150 m/s
MVPKAVEL: 41.9 m/s
MVSPHT: 65 ms
PV Reg vel dias: 92 cm/s
PW: 9 mm — AB (ref 0.6–1.1)
RV LATERAL S' VELOCITY: 7.99 cm/s
RV TAPSE: 15.1 mm
Reg peak vel: 300 cm/s
TDI e' lateral: 6.25
TDI e' medial: 5.7
TRMAXVEL: 300 cm/s
VTI: 139 cm
Valve area index: 0.27
Valve area: 0.45 cm2

## 2016-10-24 ENCOUNTER — Ambulatory Visit (INDEPENDENT_AMBULATORY_CARE_PROVIDER_SITE_OTHER): Payer: Medicare Other | Admitting: Cardiovascular Disease

## 2016-10-24 ENCOUNTER — Encounter: Payer: Self-pay | Admitting: *Deleted

## 2016-10-24 ENCOUNTER — Encounter (INDEPENDENT_AMBULATORY_CARE_PROVIDER_SITE_OTHER): Payer: Self-pay

## 2016-10-24 VITALS — BP 104/52 | HR 74 | Ht 65.0 in | Wt 128.8 lb

## 2016-10-24 DIAGNOSIS — I739 Peripheral vascular disease, unspecified: Secondary | ICD-10-CM

## 2016-10-24 DIAGNOSIS — I350 Nonrheumatic aortic (valve) stenosis: Secondary | ICD-10-CM

## 2016-10-24 DIAGNOSIS — I200 Unstable angina: Secondary | ICD-10-CM

## 2016-10-24 DIAGNOSIS — I779 Disorder of arteries and arterioles, unspecified: Secondary | ICD-10-CM

## 2016-10-24 DIAGNOSIS — I340 Nonrheumatic mitral (valve) insufficiency: Secondary | ICD-10-CM | POA: Diagnosis not present

## 2016-10-24 DIAGNOSIS — I480 Paroxysmal atrial fibrillation: Secondary | ICD-10-CM | POA: Diagnosis not present

## 2016-10-24 DIAGNOSIS — I25119 Atherosclerotic heart disease of native coronary artery with unspecified angina pectoris: Secondary | ICD-10-CM | POA: Diagnosis not present

## 2016-10-24 DIAGNOSIS — I25110 Atherosclerotic heart disease of native coronary artery with unstable angina pectoris: Secondary | ICD-10-CM

## 2016-10-24 NOTE — Patient Instructions (Signed)
Medication Instructions:  Your physician recommends that you continue on your current medications as directed. Please refer to the Current Medication list given to you today  Labwork: Lab work to be done today--BMP, CBC, PT  Testing/Procedures: Your physician has requested that you have a cardiac catheterization. Cardiac catheterization is used to diagnose and/or treat various heart conditions. Doctors may recommend this procedure for a number of different reasons. The most common reason is to evaluate chest pain. Chest pain can be a symptom of coronary artery disease (CAD), and cardiac catheterization can show whether plaque is narrowing or blocking your heart's arteries. This procedure is also used to evaluate the valves, as well as measure the blood flow and oxygen levels in different parts of your heart. For further information please visit [email or web address]. Please follow instruction sheet, as given. Scheduled for June 7,2018  Follow-Up: To be determined after catheterization  Any Other Special Instructions Will Be Listed Below (If Applicable).     If you need a refill on your cardiac medications before your next appointment, please call your pharmacy.

## 2016-10-24 NOTE — Progress Notes (Signed)
Chief Complaint  Patient presents with  . Chest Pain    History of Present Illness: 83 yo female with history of CAD s/p CABG, atrial fib s/p MAZE procedure, CVA, carotid artery disease s/p right CEA, fibromyalgia, GERD, moderate MR, severe AS who is here today for follow up. She underwent CABG in 2006 with MAZE procedure. She is known to have moderate carotid disease by last dopplers in October 2015 but she has refused repeat testing. She was found to have severe AS by echo in July 2017 along with moderate MR. We discussed her aortic valve disease at that time but she did not wish to pursue further evaluation. She is known to have atrial fibrillation but has refused anticoagulation due to hematuria when on Eliquis in 2015. Nuclear stress test at [facility] June 2016 during admission for chest pain did not show ischemia. I saw her in May 2018 and she c/o dizziness, blurred vision, chest pain and dyspnea. Echo 10/14/16 with normal LV systolic function, mild mitral regurgitation and severe aortic stenosis (mean gradient 54 mm Hg peak gradient 101 mmHg).   She is here today for follow up. She continues to have chest pain with exertion, dyspnea with minimal exertion, dizziness and blurred vision. Her symptoms have worsened over the past 6 months. No near syncope or syncope.  No LE edema.    Primary Care Physician: Denita Lung, MD   Past Medical History:  Diagnosis Date  . Arthritis    OSTEO  . Aspirin allergy    on Plavix  . Asthma   . Bronchitis   . CAD (coronary artery disease)    a. s/p CABG 2006 with Cox-Maze procedure.  . Carotid artery disease ([place])    a. s/p R CEA.  . Chronic stable angina ([place])   . Diastolic dysfunction   . Eczema   . Fibromyalgia   . GERD (gastroesophageal reflux disease)   . Heart murmur   . Hiatal hernia   . Hyperlipidemia   . Hypertension   . Kyphoscoliosis   . Mitral regurgitation   . Myocardial infarction [facility]) 2003   Stent to CFX  . PAF (paroxysmal  atrial fibrillation) ([place])    a. pt has h/o hematuria on Eliquis and has since refused anticoagulation  . Pneumonia 2015 ?  Marland Kitchen Pulmonary regurgitation   . Severe aortic stenosis   . Skin cancer of arm   . Stroke ([place])   . Tricuspid regurgitation     Past Surgical History:  Procedure Laterality Date  . ABDOMINAL HYSTERECTOMY  1976  . CARDIAC SURGERY  stents  . CAROTID ENDARTERECTOMY  2010  . CORONARY ARTERY BYPASS GRAFT  01/2005   LIMA-D1; SVG-LAD; SVG-OM; SVG-PDA  . TONSILLECTOMY    . TUMOR REMOVAL      Current Outpatient Prescriptions  Medication Sig Dispense Refill  . calcium-vitamin D (OSCAL WITH D) 500-200 MG-UNIT per tablet Take 1 tablet by mouth daily with breakfast.    . cholecalciferol (VITAMIN D) 1000 UNITS tablet Take 1,000 Units by mouth daily.    . clopidogrel (PLAVIX) 75 MG tablet TAKE 1 TABLET BY MOUTH ONCE DAILY 30 tablet 8  . diazepam (VALIUM) 2 MG tablet Take 2 mg by mouth every 6 (six) hours as needed (dizziness). Reported on 12/08/2015    . diltiazem (CARDIZEM CD) 180 MG 24 hr capsule take 1 capsule by mouth once daily 30 capsule 3  . FLOVENT HFA 110 MCG/ACT inhaler inhale 1 puff by mouth twice a day  12 g 1  . fluticasone (FLONASE) 50 MCG/ACT nasal spray instill 2 sprays into each nostril once daily 16 g 3  . guaiFENesin (MUCINEX) 600 MG 12 hr tablet Take 1 tablet (600 mg total) by mouth 2 (two) times daily. 30 tablet 0  . guaiFENesin (ROBITUSSIN) 100 MG/5ML liquid Take 100 mg by mouth 3 (three) times daily as needed for cough. Reported on 12/08/2015    . isosorbide dinitrate (ISORDIL) 30 MG tablet TAKE 1 TABLET BY MOUTH 2 TIMES DAILY 60 tablet 7  . loratadine (CLARITIN) 10 MG tablet Take 10 mg by mouth daily.      . metoprolol tartrate (LOPRESSOR) 25 MG tablet TAKE 1/2 TABLET BY MOUTH 2 TIMES DAILY 60 tablet 0  . nitroGLYCERIN (NITROSTAT) 0.4 MG SL tablet place 1 tablet UNDER THE TONGUE EVERY 5 MINUTES if needed for chest pain MAX OF 3 TABS 25 tablet 6  .  pantoprazole (PROTONIX) 40 MG tablet Take 1 tablet (40 mg total) by mouth 2 (two) times daily. 180 tablet 3  . rosuvastatin (CRESTOR) 40 MG tablet take 1 tablet by mouth once daily 90 tablet 1  . traMADol (ULTRAM) 50 MG tablet take 2 tablets by mouth three times a day if needed for pain 60 tablet 3  . valsartan (DIOVAN) 40 MG tablet TAKE 1 TABLET BY MOUTH ONCE DAILY 90 tablet 3  . VENTOLIN HFA 108 (90 Base) MCG/ACT inhaler INHALE 2 PUFFS BY MOUTH EVERY 6 HOURS AS NEEDED FOR WHEEZING OR SHORTNESS OF BREATH 18 Inhaler 5   No current facility-administered medications for this visit.     Allergies  Allergen Reactions  . Bactrim [Sulfamethoxazole-Trimethoprim] Other (See Comments)    Makes her very strange   . Amitriptyline Hcl Rash  . Aspirin Swelling and Rash  . Zetia [Ezetimibe] Rash    Social History   Social History  . Marital status: Widowed    Spouse name: N/A  . Number of children: 3  . Years of education: N/A   Occupational History  .  Retired   Social History Main Topics  . Smoking status: Never Smoker  . Smokeless tobacco: Former Systems developer    Types: Snuff    Quit date: 05/23/2001  . Alcohol use No  . Drug use: No  . Sexual activity: Not Currently   Other Topics Concern  . Not on file   Social History Narrative  . No narrative on file    Family History  Problem Relation Age of Onset  . Heart failure Mother   . Other Father     Review of Systems:  As stated in the HPI and otherwise negative.   BP (!) 104/52   Pulse 74   Ht 5\' 5"  (1.651 m)   Wt 128 lb 12.8 oz (58.4 kg)   SpO2 94%   BMI 21.43 kg/m   Physical Examination: General: Well developed, well nourished, NAD  HEENT: OP clear, mucus membranes moist  SKIN: warm, dry. No rashes. Neuro: No focal deficits  Musculoskeletal: Muscle strength 5/5 all ext  Psychiatric: Mood and affect normal  Neck: No JVD, no carotid bruits, no thyromegaly, no lymphadenopathy.  Lungs:Clear bilaterally, no wheezes, rhonci,  crackles Cardiovascular: Regular rate and rhythm. Loud harsh systolic murmur. No gallops or rubs. Abdomen:Soft. Bowel sounds present. Non-tender.  Extremities: No lower extremity edema. Pulses are 2 + in the bilateral DP/PT.  Echo 10/14/16: Left ventricle: The cavity size was normal. Wall thickness was   increased in a pattern of mild  LVH. Systolic function was normal.   The estimated ejection fraction was in the range of 60% to 65%.   Wall motion was normal; there were no regional wall motion   abnormalities. Features are consistent with a pseudonormal left   ventricular filling pattern, with concomitant abnormal relaxation   and increased filling pressure (grade 2 diastolic dysfunction). - Aortic valve: Trileaflet; severely calcified leaflets. There was   severe stenosis. Mean gradient (S): 54 mm Hg. Peak gradient (S):   101 mm Hg. Valve area (VTI): 0.45 cm^2. - Mitral valve: Mildly to moderately calcified annulus. Moderately   calcified leaflets . There was mild regurgitation. - Left atrium: The atrium was mildly dilated. - Right ventricle: The cavity size was normal. Systolic function   was normal. - Tricuspid valve: Peak RV-RA gradient (S): 36 mm Hg. - Pulmonary arteries: PA peak pressure: 44 mm Hg (S). - Systemic veins: IVC measured 1.7 cm with < 50% respirophasic   variation, suggesting RA pressure 8 mmHg.  Impressions:  - Normal LV size with mild LV hypertrophy. EF 60-65%. Moderate   diastolic dysfunction. Severe aortic stenosis. Normal RV size and   systolic function. Mild MR. Mild pulmonary hypertension.   EKG:  EKG is not  ordered today. The ekg ordered today demonstrates  Recent Labs: 11/30/2015: BUN 15; Potassium 4.0; Sodium 140 12/01/2015: Creatinine, Ser 0.48; Hemoglobin 12.2; Platelets 177   Lipid Panel    Component Value Date/Time   CHOL 144 08/21/2015 0001   TRIG 87 08/21/2015 0001   HDL 73 08/21/2015 0001   CHOLHDL 2.0 08/21/2015 0001   VLDL 17  08/21/2015 0001   LDLCALC 54 08/21/2015 0001     Wt Readings from Last 3 Encounters:  10/24/16 128 lb 12.8 oz (58.4 kg)  09/30/16 130 lb (59 kg)  05/17/16 136 lb 1.9 oz (61.7 kg)     Other studies Reviewed: Additional studies/ records that were reviewed today include: . Review of the above records demonstrates:   STS Risk Score:  Risk of Mortality: 5.21%  Morbidity or Mortality: 20.24%  Long Length of Stay: 9.042%  Short Length of Stay: 18.961%  Permanent Stroke: 2.727%  Prolonged Ventilation: 15.083%  DSW Infection: 0.229%  Renal Failure: 4.431%  Reoperation: 8.363%     Assessment and Plan:   1. Carotid artery disease: She has moderate disease but has refused repeat testing recently.     2. Mitral regurgitation: Mild by echo may 2018.     3. CAD with unstable angina: She has chronic stable angina. She has prior CABG. Will continue Plavix, Imdur, statin and beta blocker.    4. Severe Aortic stenosis: She has stage D symptomatic aortic valve stenosis. I have personally reviewed the echo images. The aortic valve is thickened, calcified with limited leaflet mobility. I think she would benefit from AVR. Given advanced age, she is not a good candidate for conventional AVR by surgical approach. I think she may be a good candidate for TAVR.  She is mostly limited by her joint aches and pains and now worsened angina and dyspnea, likely due to AS. I have reviewed the TAVR procedure in detail today with the patient and her daughter. They would like to proceed with planning for TAVR. I will arrange a right and left heart catheterization at [facility] 10/27/16. Risks and benefits of procedure reviewed with the patient. After the cath, she will be referred to see one of the CT surgeons on our TAVR team. She will then have Cardiac CT  and CTA of the chest/abd/pelvis, PFTs and carotid dopplers.   5. Atrial fibrillation,paroxysmal: She is in sinus. Continue beta blocker and Cardizem. She refuses  anti-coagulation therapy.    Current medicines are reviewed at length with the patient today.  The patient does not have concerns regarding medicines.  The following changes have been made:  no change  Labs/ tests ordered today include:   Orders Placed This Encounter  Procedures  . CBC  . Basic Metabolic Panel (BMET)  . INR/PT     Disposition:   FU with TAVR team following his cath.     Signed, Lauree Chandler, MD 10/24/2016 4:03 PM    [facility] [address], [place], [place]  [address] Phone: [phone]; Fax: [phone]

## 2016-10-25 ENCOUNTER — Other Ambulatory Visit: Payer: Self-pay | Admitting: Family Medicine

## 2016-10-25 ENCOUNTER — Telehealth: Payer: Self-pay

## 2016-10-25 ENCOUNTER — Other Ambulatory Visit: Payer: Self-pay | Admitting: Cardiovascular Disease

## 2016-10-25 LAB — CBC
HEMATOCRIT: 35.1 % (ref 34.0–46.6)
Hemoglobin: 11.5 g/dL (ref 11.1–15.9)
MCH: 29.8 pg (ref 26.6–33.0)
MCHC: 32.8 g/dL (ref 31.5–35.7)
MCV: 91 fL (ref 79–97)
PLATELETS: 214 10*3/uL (ref 150–379)
RBC: 3.86 x10E6/uL (ref 3.77–5.28)
RDW: 14.1 % (ref 12.3–15.4)
WBC: 5.8 10*3/uL (ref 3.4–10.8)

## 2016-10-25 LAB — BASIC METABOLIC PANEL
BUN/Creatinine Ratio: 30 — ABNORMAL HIGH (ref 12–28)
BUN: 13 mg/dL (ref 8–27)
CALCIUM: 10 mg/dL (ref 8.7–10.3)
CHLORIDE: 103 mmol/L (ref 96–106)
CO2: 27 mmol/L (ref 18–29)
Creatinine, Ser: 0.44 mg/dL — ABNORMAL LOW (ref 0.57–1.00)
GFR calc Af Amer: 106 mL/min/{1.73_m2} (ref >59)
GFR, EST NON AFRICAN AMERICAN: 92 mL/min/{1.73_m2} (ref >59)
GLUCOSE: 105 mg/dL — AB (ref 65–99)
POTASSIUM: 4.5 mmol/L (ref 3.5–5.2)
SODIUM: 143 mmol/L (ref 134–144)

## 2016-10-25 LAB — PROTIME-INR
INR: 1 (ref 0.8–1.2)
PROTHROMBIN TIME: 10.2 s (ref 9.1–12.0)

## 2016-10-25 NOTE — Telephone Encounter (Signed)
Pre cath call made to Pt.  Call went to VM.   Left message requesting call back if any questions/concerns regarding procedure on Thursday.  This nurse name and # left.  Will await call.

## 2016-10-26 ENCOUNTER — Other Ambulatory Visit: Payer: Self-pay | Admitting: *Deleted

## 2016-10-26 DIAGNOSIS — I350 Nonrheumatic aortic (valve) stenosis: Secondary | ICD-10-CM

## 2016-10-27 ENCOUNTER — Encounter (HOSPITAL_COMMUNITY)
Admission: RE | Disposition: A | Discharge status: Home / Self Care | Payer: Self-pay | Source: Ambulatory Visit | Attending: Cardiovascular Disease

## 2016-10-27 ENCOUNTER — Ambulatory Visit (HOSPITAL_COMMUNITY)
Admission: RE | Admit: 2016-10-27 | Discharge: 2016-10-27 | Disposition: A | Discharge status: Home / Self Care | Payer: Medicare Other | Source: Ambulatory Visit | Attending: Cardiovascular Disease | Admitting: Cardiovascular Disease

## 2016-10-27 DIAGNOSIS — I080 Rheumatic disorders of both mitral and aortic valves: Secondary | ICD-10-CM | POA: Insufficient documentation

## 2016-10-27 DIAGNOSIS — Z7902 Long term (current) use of antithrombotics/antiplatelets: Secondary | ICD-10-CM | POA: Diagnosis not present

## 2016-10-27 DIAGNOSIS — I252 Old myocardial infarction: Secondary | ICD-10-CM | POA: Diagnosis not present

## 2016-10-27 DIAGNOSIS — Z87891 Personal history of nicotine dependence: Secondary | ICD-10-CM | POA: Insufficient documentation

## 2016-10-27 DIAGNOSIS — Z955 Presence of coronary angioplasty implant and graft: Secondary | ICD-10-CM | POA: Diagnosis not present

## 2016-10-27 DIAGNOSIS — Z882 Allergy status to sulfonamides status: Secondary | ICD-10-CM | POA: Diagnosis not present

## 2016-10-27 DIAGNOSIS — K219 Gastro-esophageal reflux disease without esophagitis: Secondary | ICD-10-CM | POA: Diagnosis not present

## 2016-10-27 DIAGNOSIS — I2510 Atherosclerotic heart disease of native coronary artery without angina pectoris: Secondary | ICD-10-CM

## 2016-10-27 DIAGNOSIS — E785 Hyperlipidemia, unspecified: Secondary | ICD-10-CM | POA: Diagnosis not present

## 2016-10-27 DIAGNOSIS — Z7951 Long term (current) use of inhaled steroids: Secondary | ICD-10-CM | POA: Insufficient documentation

## 2016-10-27 DIAGNOSIS — I10 Essential (primary) hypertension: Secondary | ICD-10-CM | POA: Insufficient documentation

## 2016-10-27 DIAGNOSIS — I25720 Atherosclerosis of autologous artery coronary artery bypass graft(s) with unstable angina pectoris: Secondary | ICD-10-CM | POA: Diagnosis not present

## 2016-10-27 DIAGNOSIS — Z8673 Personal history of transient ischemic attack (TIA), and cerebral infarction without residual deficits: Secondary | ICD-10-CM | POA: Insufficient documentation

## 2016-10-27 DIAGNOSIS — I480 Paroxysmal atrial fibrillation: Secondary | ICD-10-CM | POA: Diagnosis not present

## 2016-10-27 DIAGNOSIS — M797 Fibromyalgia: Secondary | ICD-10-CM | POA: Insufficient documentation

## 2016-10-27 DIAGNOSIS — I2582 Chronic total occlusion of coronary artery: Secondary | ICD-10-CM | POA: Diagnosis not present

## 2016-10-27 DIAGNOSIS — I350 Nonrheumatic aortic (valve) stenosis: Secondary | ICD-10-CM | POA: Diagnosis present

## 2016-10-27 DIAGNOSIS — J45909 Unspecified asthma, uncomplicated: Secondary | ICD-10-CM | POA: Insufficient documentation

## 2016-10-27 HISTORY — PX: RIGHT/LEFT HEART CATH AND CORONARY/GRAFT ANGIOGRAPHY: CATH118267

## 2016-10-27 LAB — POCT I-STAT 3, VENOUS BLOOD GAS (G3P V)
ACID-BASE EXCESS: 2 mmol/L (ref 0.0–2.0)
Bicarbonate: 27.9 mmol/L (ref 20.0–28.0)
O2 SAT: 72 %
PCO2 VEN: 47.9 mmHg (ref 44.0–60.0)
TCO2: 29 mmol/L (ref 0–100)
pH, Ven: 7.374 (ref 7.250–7.430)
pO2, Ven: 39 mmHg (ref 32.0–45.0)

## 2016-10-27 LAB — POCT I-STAT 3, ART BLOOD GAS (G3+)
Acid-Base Excess: 2 mmol/L (ref 0.0–2.0)
Bicarbonate: 26.4 mmol/L (ref 20.0–28.0)
O2 Saturation: 99 %
PH ART: 7.41 (ref 7.350–7.450)
TCO2: 28 mmol/L (ref 0–100)
pCO2 arterial: 41.7 mmHg (ref 32.0–48.0)
pO2, Arterial: 149 mmHg — ABNORMAL HIGH (ref 83.0–108.0)

## 2016-10-27 SURGERY — RIGHT/LEFT HEART CATH AND CORONARY/GRAFT ANGIOGRAPHY
Anesthesia: LOCAL

## 2016-10-27 MED ORDER — IOPAMIDOL (ISOVUE-370) INJECTION 76%
INTRAVENOUS | Status: AC
  Filled 2016-10-27: qty 50

## 2016-10-27 MED ORDER — SODIUM CHLORIDE 0.9% FLUSH: 3.0000 mL | INTRAVENOUS | Status: DC | PRN

## 2016-10-27 MED ORDER — IOPAMIDOL (ISOVUE-370) INJECTION 76%
INTRAVENOUS | Status: AC
  Filled 2016-10-27: qty 100

## 2016-10-27 MED ORDER — LIDOCAINE HCL (PF) 1 % IJ SOLN
INTRAMUSCULAR | Status: AC
  Filled 2016-10-27: qty 30

## 2016-10-27 MED ORDER — FENTANYL CITRATE (PF) 100 MCG/2ML IJ SOLN
INTRAMUSCULAR | Status: AC
  Filled 2016-10-27: qty 2

## 2016-10-27 MED ORDER — SODIUM CHLORIDE 0.9 % IV SOLN: 250.0000 mL | INTRAVENOUS | Status: DC | PRN

## 2016-10-27 MED ORDER — SODIUM CHLORIDE 0.9 % IV SOLN
INTRAVENOUS | Status: AC
  Administered 2016-10-27: 09:00:00 via INTRAVENOUS

## 2016-10-27 MED ORDER — HEPARIN (PORCINE) IN NACL 2-0.9 UNIT/ML-% IJ SOLN
INTRAMUSCULAR | Status: AC
  Filled 2016-10-27: qty 1000

## 2016-10-27 MED ORDER — SODIUM CHLORIDE 0.9% FLUSH: 3.0000 mL | Freq: Two times a day (BID) | INTRAVENOUS | Status: DC

## 2016-10-27 MED ORDER — IOPAMIDOL (ISOVUE-370) INJECTION 76%
INTRAVENOUS | Status: DC | PRN
  Administered 2016-10-27: 100 mL via INTRA_ARTERIAL

## 2016-10-27 MED ORDER — SODIUM CHLORIDE 0.9 % IV SOLN: INTRAVENOUS | Status: AC

## 2016-10-27 MED ORDER — HEPARIN (PORCINE) IN NACL 2-0.9 UNIT/ML-% IJ SOLN
INTRAMUSCULAR | Status: DC | PRN
  Administered 2016-10-27: 12:00:00

## 2016-10-27 MED ORDER — FENTANYL CITRATE (PF) 100 MCG/2ML IJ SOLN
INTRAMUSCULAR | Status: DC | PRN
  Administered 2016-10-27: 25 ug via INTRAVENOUS

## 2016-10-27 MED ORDER — MIDAZOLAM HCL 2 MG/2ML IJ SOLN
INTRAMUSCULAR | Status: AC
  Filled 2016-10-27: qty 2

## 2016-10-27 MED ORDER — MIDAZOLAM HCL 2 MG/2ML IJ SOLN
INTRAMUSCULAR | Status: DC | PRN
  Administered 2016-10-27: 1 mg via INTRAVENOUS

## 2016-10-27 MED ORDER — LIDOCAINE HCL (PF) 1 % IJ SOLN
INTRAMUSCULAR | Status: DC | PRN
  Administered 2016-10-27: 15 mL

## 2016-10-27 SURGICAL SUPPLY — 13 items
CATH INFINITI 5 FR LCB (CATHETERS) IMPLANT
CATH INFINITI 5FR AL1 (CATHETERS) ×1 IMPLANT
CATH INFINITI 5FR MULTPACK ANG (CATHETERS) ×1 IMPLANT
CATH SWAN GANZ 7F STRAIGHT (CATHETERS) ×1 IMPLANT
KIT HEART LEFT (KITS) ×2 IMPLANT
KIT HEART RIGHT NAMIC (KITS) ×2 IMPLANT
PACK CARDIAC CATHETERIZATION (CUSTOM PROCEDURE TRAY) ×2 IMPLANT
SHEATH PINNACLE 5F 10CM (SHEATH) ×2 IMPLANT
SHEATH PINNACLE 7F 10CM (SHEATH) ×1 IMPLANT
TRANSDUCER W/STOPCOCK (MISCELLANEOUS) ×2 IMPLANT
TUBING CIL FLEX 10 FLL-RA (TUBING) ×2 IMPLANT
WIRE EMERALD 3MM-J .035X150CM (WIRE) ×1 IMPLANT
WIRE EMERALD ST .035X150CM (WIRE) ×1 IMPLANT

## 2016-10-27 NOTE — Progress Notes (Signed)
Groin site rebleed. Manual pressure applied for 20 minutes. Groin site soft, no hematoma. Slight bruising present medial to stick site. Tegaderm dressing applied.   Bedrest now begins at 13:25:00

## 2016-10-27 NOTE — H&P (View-Only) (Signed)
Chief Complaint  Patient presents with  . Chest Pain    History of Present Illness: 83 yo female with history of CAD s/p CABG, atrial fib s/p MAZE procedure, CVA, carotid artery disease s/p right CEA, fibromyalgia, GERD, moderate MR, severe AS who is here today for follow up. She underwent CABG in 2006 with MAZE procedure. She is known to have moderate carotid disease by last dopplers in October 2015 but she has refused repeat testing. She was found to have severe AS by echo in July 2017 along with moderate MR. We discussed her aortic valve disease at that time but she did not wish to pursue further evaluation. She is known to have atrial fibrillation but has refused anticoagulation due to hematuria when on Eliquis in 2015. Nuclear stress test at [facility] June 2016 during admission for chest pain did not show ischemia. I saw her in May 2018 and she c/o dizziness, blurred vision, chest pain and dyspnea. Echo 10/14/16 with normal LV systolic function, mild mitral regurgitation and severe aortic stenosis (mean gradient 54 mm Hg peak gradient 101 mmHg).   She is here today for follow up. She continues to have chest pain with exertion, dyspnea with minimal exertion, dizziness and blurred vision. Her symptoms have worsened over the past 6 months. No near syncope or syncope.  No LE edema.    Primary Care Physician: Denita Lung, MD   Past Medical History:  Diagnosis Date  . Arthritis    OSTEO  . Aspirin allergy    on Plavix  . Asthma   . Bronchitis   . CAD (coronary artery disease)    a. s/p CABG 2006 with Cox-Maze procedure.  . Carotid artery disease ([place])    a. s/p R CEA.  . Chronic stable angina ([place])   . Diastolic dysfunction   . Eczema   . Fibromyalgia   . GERD (gastroesophageal reflux disease)   . Heart murmur   . Hiatal hernia   . Hyperlipidemia   . Hypertension   . Kyphoscoliosis   . Mitral regurgitation   . Myocardial infarction [facility]) 2003   Stent to CFX  . PAF (paroxysmal  atrial fibrillation) ([place])    a. pt has h/o hematuria on Eliquis and has since refused anticoagulation  . Pneumonia 2015 ?  Marland Kitchen Pulmonary regurgitation   . Severe aortic stenosis   . Skin cancer of arm   . Stroke ([place])   . Tricuspid regurgitation     Past Surgical History:  Procedure Laterality Date  . ABDOMINAL HYSTERECTOMY  1976  . CARDIAC SURGERY  stents  . CAROTID ENDARTERECTOMY  2010  . CORONARY ARTERY BYPASS GRAFT  01/2005   LIMA-D1; SVG-LAD; SVG-OM; SVG-PDA  . TONSILLECTOMY    . TUMOR REMOVAL      Current Outpatient Prescriptions  Medication Sig Dispense Refill  . calcium-vitamin D (OSCAL WITH D) 500-200 MG-UNIT per tablet Take 1 tablet by mouth daily with breakfast.    . cholecalciferol (VITAMIN D) 1000 UNITS tablet Take 1,000 Units by mouth daily.    . clopidogrel (PLAVIX) 75 MG tablet TAKE 1 TABLET BY MOUTH ONCE DAILY 30 tablet 8  . diazepam (VALIUM) 2 MG tablet Take 2 mg by mouth every 6 (six) hours as needed (dizziness). Reported on 12/08/2015    . diltiazem (CARDIZEM CD) 180 MG 24 hr capsule take 1 capsule by mouth once daily 30 capsule 3  . FLOVENT HFA 110 MCG/ACT inhaler inhale 1 puff by mouth twice a day  12 g 1  . fluticasone (FLONASE) 50 MCG/ACT nasal spray instill 2 sprays into each nostril once daily 16 g 3  . guaiFENesin (MUCINEX) 600 MG 12 hr tablet Take 1 tablet (600 mg total) by mouth 2 (two) times daily. 30 tablet 0  . guaiFENesin (ROBITUSSIN) 100 MG/5ML liquid Take 100 mg by mouth 3 (three) times daily as needed for cough. Reported on 12/08/2015    . isosorbide dinitrate (ISORDIL) 30 MG tablet TAKE 1 TABLET BY MOUTH 2 TIMES DAILY 60 tablet 7  . loratadine (CLARITIN) 10 MG tablet Take 10 mg by mouth daily.      . metoprolol tartrate (LOPRESSOR) 25 MG tablet TAKE 1/2 TABLET BY MOUTH 2 TIMES DAILY 60 tablet 0  . nitroGLYCERIN (NITROSTAT) 0.4 MG SL tablet place 1 tablet UNDER THE TONGUE EVERY 5 MINUTES if needed for chest pain MAX OF 3 TABS 25 tablet 6  .  pantoprazole (PROTONIX) 40 MG tablet Take 1 tablet (40 mg total) by mouth 2 (two) times daily. 180 tablet 3  . rosuvastatin (CRESTOR) 40 MG tablet take 1 tablet by mouth once daily 90 tablet 1  . traMADol (ULTRAM) 50 MG tablet take 2 tablets by mouth three times a day if needed for pain 60 tablet 3  . valsartan (DIOVAN) 40 MG tablet TAKE 1 TABLET BY MOUTH ONCE DAILY 90 tablet 3  . VENTOLIN HFA 108 (90 Base) MCG/ACT inhaler INHALE 2 PUFFS BY MOUTH EVERY 6 HOURS AS NEEDED FOR WHEEZING OR SHORTNESS OF BREATH 18 Inhaler 5   No current facility-administered medications for this visit.     Allergies  Allergen Reactions  . Bactrim [Sulfamethoxazole-Trimethoprim] Other (See Comments)    Makes her very strange   . Amitriptyline Hcl Rash  . Aspirin Swelling and Rash  . Zetia [Ezetimibe] Rash    Social History   Social History  . Marital status: Widowed    Spouse name: N/A  . Number of children: 3  . Years of education: N/A   Occupational History  .  Retired   Social History Main Topics  . Smoking status: Never Smoker  . Smokeless tobacco: Former Systems developer    Types: Snuff    Quit date: 05/23/2001  . Alcohol use No  . Drug use: No  . Sexual activity: Not Currently   Other Topics Concern  . Not on file   Social History Narrative  . No narrative on file    Family History  Problem Relation Age of Onset  . Heart failure Mother   . Other Father     Review of Systems:  As stated in the HPI and otherwise negative.   BP (!) 104/52   Pulse 74   Ht 5\' 5"  (1.651 m)   Wt 128 lb 12.8 oz (58.4 kg)   SpO2 94%   BMI 21.43 kg/m   Physical Examination: General: Well developed, well nourished, NAD  HEENT: OP clear, mucus membranes moist  SKIN: warm, dry. No rashes. Neuro: No focal deficits  Musculoskeletal: Muscle strength 5/5 all ext  Psychiatric: Mood and affect normal  Neck: No JVD, no carotid bruits, no thyromegaly, no lymphadenopathy.  Lungs:Clear bilaterally, no wheezes, rhonci,  crackles Cardiovascular: Regular rate and rhythm. Loud harsh systolic murmur. No gallops or rubs. Abdomen:Soft. Bowel sounds present. Non-tender.  Extremities: No lower extremity edema. Pulses are 2 + in the bilateral DP/PT.  Echo 10/14/16: Left ventricle: The cavity size was normal. Wall thickness was   increased in a pattern of mild  LVH. Systolic function was normal.   The estimated ejection fraction was in the range of 60% to 65%.   Wall motion was normal; there were no regional wall motion   abnormalities. Features are consistent with a pseudonormal left   ventricular filling pattern, with concomitant abnormal relaxation   and increased filling pressure (grade 2 diastolic dysfunction). - Aortic valve: Trileaflet; severely calcified leaflets. There was   severe stenosis. Mean gradient (S): 54 mm Hg. Peak gradient (S):   101 mm Hg. Valve area (VTI): 0.45 cm^2. - Mitral valve: Mildly to moderately calcified annulus. Moderately   calcified leaflets . There was mild regurgitation. - Left atrium: The atrium was mildly dilated. - Right ventricle: The cavity size was normal. Systolic function   was normal. - Tricuspid valve: Peak RV-RA gradient (S): 36 mm Hg. - Pulmonary arteries: PA peak pressure: 44 mm Hg (S). - Systemic veins: IVC measured 1.7 cm with < 50% respirophasic   variation, suggesting RA pressure 8 mmHg.  Impressions:  - Normal LV size with mild LV hypertrophy. EF 60-65%. Moderate   diastolic dysfunction. Severe aortic stenosis. Normal RV size and   systolic function. Mild MR. Mild pulmonary hypertension.   EKG:  EKG is not  ordered today. The ekg ordered today demonstrates  Recent Labs: 11/30/2015: BUN 15; Potassium 4.0; Sodium 140 12/01/2015: Creatinine, Ser 0.48; Hemoglobin 12.2; Platelets 177   Lipid Panel    Component Value Date/Time   CHOL 144 08/21/2015 0001   TRIG 87 08/21/2015 0001   HDL 73 08/21/2015 0001   CHOLHDL 2.0 08/21/2015 0001   VLDL 17  08/21/2015 0001   LDLCALC 54 08/21/2015 0001     Wt Readings from Last 3 Encounters:  10/24/16 128 lb 12.8 oz (58.4 kg)  09/30/16 130 lb (59 kg)  05/17/16 136 lb 1.9 oz (61.7 kg)     Other studies Reviewed: Additional studies/ records that were reviewed today include: . Review of the above records demonstrates:   STS Risk Score:  Risk of Mortality: 5.21%  Morbidity or Mortality: 20.24%  Long Length of Stay: 9.042%  Short Length of Stay: 18.961%  Permanent Stroke: 2.727%  Prolonged Ventilation: 15.083%  DSW Infection: 0.229%  Renal Failure: 4.431%  Reoperation: 8.363%     Assessment and Plan:   1. Carotid artery disease: She has moderate disease but has refused repeat testing recently.     2. Mitral regurgitation: Mild by echo may 2018.     3. CAD with unstable angina: She has chronic stable angina. She has prior CABG. Will continue Plavix, Imdur, statin and beta blocker.    4. Severe Aortic stenosis: She has stage D symptomatic aortic valve stenosis. I have personally reviewed the echo images. The aortic valve is thickened, calcified with limited leaflet mobility. I think she would benefit from AVR. Given advanced age, she is not a good candidate for conventional AVR by surgical approach. I think she may be a good candidate for TAVR.  She is mostly limited by her joint aches and pains and now worsened angina and dyspnea, likely due to AS. I have reviewed the TAVR procedure in detail today with the patient and her daughter. They would like to proceed with planning for TAVR. I will arrange a right and left heart catheterization at [facility] 10/27/16. Risks and benefits of procedure reviewed with the patient. After the cath, she will be referred to see one of the CT surgeons on our TAVR team. She will then have Cardiac CT  and CTA of the chest/abd/pelvis, PFTs and carotid dopplers.   5. Atrial fibrillation,paroxysmal: She is in sinus. Continue beta blocker and Cardizem. She refuses  anti-coagulation therapy.    Current medicines are reviewed at length with the patient today.  The patient does not have concerns regarding medicines.  The following changes have been made:  no change  Labs/ tests ordered today include:   Orders Placed This Encounter  Procedures  . CBC  . Basic Metabolic Panel (BMET)  . INR/PT     Disposition:   FU with TAVR team following his cath.     Signed, Lauree Chandler, MD 10/24/2016 4:03 PM    [facility] [address], [place], [place]  [address] Phone: [phone]; Fax: [phone]

## 2016-10-27 NOTE — Interval H&P Note (Signed)
History and Physical Interval Note:  10/27/2016 10:01 AM  [name]  has presented today for cardiac cath with the diagnosis of severe aortic stenosis, CAD s/p CABG with chest pain and dyspnea. The various methods of treatment have been discussed with the patient and family. After consideration of risks, benefits and other options for treatment, the patient has consented to  Procedure(s): Right/Left Heart Cath and Coronary/Graft Angiography (N/A) as a surgical intervention .  The patient's history has been reviewed, patient examined, no change in status, stable for surgery.  I have reviewed the patient's chart and labs.  Questions were answered to the patient's satisfaction.    Cath Lab Visit (complete for each Cath Lab visit)  Clinical Evaluation Leading to the Procedure:   ACS: No.  Non-ACS:    Anginal Classification: CCS III  Anti-ischemic medical therapy: Maximal Therapy (2 or more classes of medications)  Non-Invasive Test Results: No non-invasive testing performed  Prior CABG: Previous CABG         [name]

## 2016-10-27 NOTE — Progress Notes (Signed)
Patient had re bleed after being placed on bedpan at approximately 14:25 of a size of about a ping pong ball  bleeding under the skin on the right groin found after patient report of pain in the area of the the right groin.  Pressure held collectively for 24 minutes at a steady hold between this Probation officer and Massie Maroon RN.  Aaron Edelman from cath lab came over after notifying to compare site to previous report and patient bed red resumed to start back at 3 pm after turning off bedpan.   Patient right groin currently found with bruising though soft when palpated.  Patient currently resting with no complaint.  Reported off to Massie Maroon RN at this time.

## 2016-10-27 NOTE — Progress Notes (Signed)
Site area: rt groin fa sheath Site Prior to Removal:  Level 0 Pressure Applied For: 20 minutes Manual:   yes Patient Status During Pull:  stable Post Pull Site:  Level  0 Post Pull Instructions Given:  yes Post Pull Pulses Present: palpable Dressing Applied:  Gauze and tegaderm Bedrest begins @ [id number] Comments:

## 2016-10-27 NOTE — Discharge Instructions (Signed)

## 2016-10-28 ENCOUNTER — Encounter (HOSPITAL_COMMUNITY): Payer: Self-pay | Admitting: Cardiovascular Disease

## 2016-11-01 ENCOUNTER — Ambulatory Visit (HOSPITAL_COMMUNITY)
Admit: 2016-11-01 | Discharge: 2016-11-01 | Disposition: A | Discharge status: Home / Self Care | Payer: Medicare Other | Attending: Cardiovascular Disease | Admitting: Cardiovascular Disease

## 2016-11-01 ENCOUNTER — Ambulatory Visit (HOSPITAL_COMMUNITY)
Admission: RE | Admit: 2016-11-01 | Discharge: 2016-11-01 | Disposition: A | Discharge status: Home / Self Care | Payer: Medicare Other | Source: Ambulatory Visit | Attending: Cardiovascular Disease | Admitting: Cardiovascular Disease

## 2016-11-01 ENCOUNTER — Encounter: Payer: Self-pay | Admitting: Physical Therapy

## 2016-11-01 ENCOUNTER — Telehealth: Payer: Self-pay | Admitting: Cardiovascular Disease

## 2016-11-01 ENCOUNTER — Encounter (HOSPITAL_COMMUNITY): Payer: Self-pay

## 2016-11-01 ENCOUNTER — Telehealth: Payer: Self-pay

## 2016-11-01 ENCOUNTER — Ambulatory Visit (HOSPITAL_COMMUNITY): Payer: Medicare Other

## 2016-11-01 ENCOUNTER — Ambulatory Visit: Payer: Medicare Other | Attending: Cardiovascular Disease | Admitting: Physical Therapy

## 2016-11-01 DIAGNOSIS — R293 Abnormal posture: Secondary | ICD-10-CM | POA: Diagnosis not present

## 2016-11-01 DIAGNOSIS — K5730 Diverticulosis of large intestine without perforation or abscess without bleeding: Secondary | ICD-10-CM | POA: Diagnosis not present

## 2016-11-01 DIAGNOSIS — M6281 Muscle weakness (generalized): Secondary | ICD-10-CM | POA: Insufficient documentation

## 2016-11-01 DIAGNOSIS — I700 Atherosclerosis of aorta: Secondary | ICD-10-CM | POA: Diagnosis not present

## 2016-11-01 DIAGNOSIS — I6523 Occlusion and stenosis of bilateral carotid arteries: Secondary | ICD-10-CM | POA: Diagnosis not present

## 2016-11-01 DIAGNOSIS — I350 Nonrheumatic aortic (valve) stenosis: Secondary | ICD-10-CM | POA: Diagnosis not present

## 2016-11-01 DIAGNOSIS — I2510 Atherosclerotic heart disease of native coronary artery without angina pectoris: Secondary | ICD-10-CM | POA: Insufficient documentation

## 2016-11-01 DIAGNOSIS — R2689 Other abnormalities of gait and mobility: Secondary | ICD-10-CM | POA: Diagnosis not present

## 2016-11-01 DIAGNOSIS — Z01818 Encounter for other preprocedural examination: Secondary | ICD-10-CM | POA: Diagnosis not present

## 2016-11-01 LAB — VAS US CAROTID
LCCAPDIAS: 13 cm/s
LCCAPSYS: 74 cm/s
LEFT ECA DIAS: -3 cm/s
LEFT VERTEBRAL DIAS: -1 cm/s
Left CCA dist dias: -24 cm/s
Left CCA dist sys: -92 cm/s
Left ICA dist dias: -20 cm/s
Left ICA dist sys: -105 cm/s
Left ICA prox dias: -15 cm/s
Left ICA prox sys: -119 cm/s
RCCAPDIAS: 13 cm/s
RIGHT ECA DIAS: -2 cm/s
RIGHT VERTEBRAL DIAS: -11 cm/s
Right CCA prox sys: 92 cm/s
Right cca dist sys: -105 cm/s

## 2016-11-01 LAB — PULMONARY FUNCTION TEST
DL/VA % pred: 70 %
DL/VA: 3.45 ml/min/mmHg/L
DLCO UNC: 10.85 ml/min/mmHg
DLCO cor % pred: 45 %
DLCO cor: 11.59 ml/min/mmHg
DLCO unc % pred: 42 %
FEF 25-75 POST: 0.46 L/s
FEF 25-75 Pre: 0.39 L/sec
FEF2575-%Change-Post: 17 %
FEF2575-%PRED-POST: 38 %
FEF2575-%Pred-Pre: 33 %
FEV1-%CHANGE-POST: -8 %
FEV1-%PRED-POST: 45 %
FEV1-%Pred-Pre: 49 %
FEV1-POST: 0.83 L
FEV1-PRE: 0.91 L
FEV1FVC-%Change-Post: -8 %
FEV1FVC-%PRED-PRE: 62 %
FEV6-%Change-Post: 3 %
FEV6-%Pred-Post: 82 %
FEV6-%Pred-Pre: 80 %
FEV6-POST: 1.93 L
FEV6-PRE: 1.87 L
FEV6FVC-%Change-Post: 3 %
FEV6FVC-%PRED-POST: 102 %
FEV6FVC-%Pred-Pre: 98 %
FVC-%CHANGE-POST: 0 %
FVC-%PRED-PRE: 81 %
FVC-%Pred-Post: 80 %
FVC-POST: 2.01 L
FVC-PRE: 2.02 L
PRE FEV6/FVC RATIO: 93 %
Post FEV1/FVC ratio: 42 %
Post FEV6/FVC ratio: 96 %
Pre FEV1/FVC ratio: 45 %

## 2016-11-01 MED ORDER — ALBUTEROL SULFATE (2.5 MG/3ML) 0.083% IN NEBU
2.5000 mg | INHALATION_SOLUTION | Freq: Once | RESPIRATORY_TRACT | Status: AC
  Administered 2016-11-01: 2.5 mg via RESPIRATORY_TRACT

## 2016-11-01 MED ORDER — IOPAMIDOL (ISOVUE-370) INJECTION 76%
INTRAVENOUS | Status: AC
  Administered 2016-11-01: 50 mL
  Filled 2016-11-01: qty 50

## 2016-11-01 MED ORDER — APIXABAN 2.5 MG PO TABS: 2.5000 mg | ORAL_TABLET | Freq: Two times a day (BID) | ORAL | 2 refills | Status: DC

## 2016-11-01 MED ORDER — IOPAMIDOL (ISOVUE-370) INJECTION 76%
INTRAVENOUS | Status: AC
Start: 2016-11-01 — End: 2016-11-01
  Administered 2016-11-01: 100 mL
  Filled 2016-11-01: qty 100

## 2016-11-01 NOTE — Progress Notes (Signed)
VASCULAR LAB PRELIMINARY  PRELIMINARY  PRELIMINARY  PRELIMINARY  Carotid duplex completed.    Preliminary report:  Bilateral :  40-59% internal carotid artery stenosis.  Vertebral artery flow is antegrade.  Sandee Bernath, RVS 11/01/2016, 12:38 PM

## 2016-11-01 NOTE — Telephone Encounter (Addendum)
Received incoming call from [place] at [facility]. Pre-TAVR pt. Report from CT Angiogram - Impression #3 - left atrial thrombus. Dr. Angelena Form out this week for vacation. Dr. Curt Bears (DOD) stated pt should be seen this week. Discussed with Theodosia Quay, RN. Information will be forwarded to Dr. Angelena Form.

## 2016-11-01 NOTE — Telephone Encounter (Signed)
Dr Burt Knack is aware of CTA findings and is in the process of having Dr Johnsie Cancel review CTA for further recommendations.

## 2016-11-01 NOTE — Telephone Encounter (Signed)
Did not need this encounter °

## 2016-11-01 NOTE — Addendum Note (Signed)
Addended by: Barkley Boards on: 11/01/2016 06:14 PM   Modules accepted: Orders

## 2016-11-01 NOTE — Telephone Encounter (Signed)
Discussed case with Dr Burt Knack and he would like the pt to STOP Plavix and START Eliquis 2.5mg  take one tablet by mouth twice a day. I spoke with the pt and daughter [name]) and made her aware of CTA findings. I discussed medication changes in depth with the pt and made her aware of reason for needing to take an anticoagulant.  Previously the pt developed hematuria after 2 doses of Eliquis 5mg .  I advised her that due to age and weight she needs a lower dosage.  Pt agrees to stop plavix and start Eliquis and was advised to contact the office if she develops an issues with bleeding. The pt will contact the office with any additional questions or concerns.

## 2016-11-01 NOTE — Therapy (Signed)
[place], [place], [address] Phone: [phone]   Fax:  [phone]  Physical Therapy Evaluation  Patient Details  Name: [name] MRN: [id number] Date of Birth: Nov 25, 1928 Referring Provider: Dr. Lauree Chandler  Encounter Date: 11/01/2016      PT End of Session - 11/01/16 1642    Visit Number 1   PT Start Time 0348   PT Stop Time 0430   PT Time Calculation (min) 42 min   Equipment Utilized During Treatment Gait belt      Past Medical History:  Diagnosis Date  . Arthritis    OSTEO  . Aspirin allergy    on Plavix  . Asthma   . Bronchitis   . CAD (coronary artery disease)    a. s/p CABG 2006 with Cox-Maze procedure.  . Carotid artery disease ([place])    a. s/p R CEA.  . Chronic stable angina ([place])   . Diastolic dysfunction   . Eczema   . Fibromyalgia   . GERD (gastroesophageal reflux disease)   . Heart murmur   . Hiatal hernia   . Hyperlipidemia   . Hypertension   . Kyphoscoliosis   . Mitral regurgitation   . Myocardial infarction [facility]) 2003   Stent to CFX  . PAF (paroxysmal atrial fibrillation) ([place])    a. pt has h/o hematuria on Eliquis and has since refused anticoagulation  . Pneumonia 2015 ?  Marland Kitchen Pulmonary regurgitation   . Severe aortic stenosis   . Skin cancer of arm   . Stroke ([place])   . Tricuspid regurgitation     Past Surgical History:  Procedure Laterality Date  . ABDOMINAL HYSTERECTOMY  1976  . CARDIAC SURGERY  stents  . CAROTID ENDARTERECTOMY  2010  . CORONARY ARTERY BYPASS GRAFT  01/2005   LIMA-D1; SVG-LAD; SVG-OM; SVG-PDA  . RIGHT/LEFT HEART CATH AND CORONARY/GRAFT ANGIOGRAPHY N/A 10/27/2016   Procedure: Right/Left Heart Cath and Coronary/Graft Angiography;  Surgeon: Burnell Blanks, MD;  Location: [place] [facility];  Service: Cardiovascular;  Laterality: N/A;  . TONSILLECTOMY    . TUMOR REMOVAL      There were no vitals filed for this visit.        Subjective Assessment - 11/01/16 1551    Subjective Pt is here with daughter, [name]. Pt/daughter report a slight worsening of symptoms over past year or so. Symptoms include shortness of breath and chest pain with minimal exertion and uses nitroglycerin routinely.    Patient Stated Goals to fix heart   Currently in Pain? Yes  Will monitor but outside scope of referral for tx   Pain Score 6   arthritic - all over            [facility] PT Assessment - 11/01/16 0001      Assessment   Medical Diagnosis severe aortic stenosis   Referring Provider Dr. Lauree Chandler   Onset Date/Surgical Date 10/24/16     Precautions   Precautions Fall     Restrictions   Weight Bearing Restrictions No     Balance Screen   Has the patient fallen in the past 6 months No   Has the patient had a decrease in activity level because of a fear of falling?  No   Is the patient reluctant to leave their home because of a fear of falling?  No     Home Social worker Private residence   Home Access Stairs to enter  daughter always assist   Entrance Stairs-Number of Steps 2   Entrance Stairs-Rails Right;Left   Home Layout One level     Prior Function   Level of Independence Independent with household mobility with device;Independent with community mobility with device     Posture/Postural Control   Posture/Postural Control Postural limitations   Postural Limitations Forward head;Rounded Shoulders   Posture Comments Standing posture severely limited. Pt unable to stand upright and is forward flexed.      ROM / Strength   AROM / PROM / Strength AROM;Strength     AROM   Overall AROM Comments shoulders limited by 40% otherwise [facility]     Strength   Overall Strength Comments UE grossly 3+/5 within available range, lower extremity 3+/5 hips and ankles, knees 4/5   Strength Assessment Site Hand   Right/Left hand Right;Left   Right Hand Grip (lbs) 15  R hand dominant   Left Hand  Grip (lbs) 10     Ambulation/Gait   Gait Comments Pt ambulates with severe forward flexed posture and is unable to stand upright. Gait distance is limited by 95% for her age/gender.           [facility] Pre-Surgical Assessment - Nov 05, 2016 0001    5 Meter Walk Test- trial 1 24 sec  with single point cane and hand held assist   comment unable without UE support   Comment unable without UE support   ADL/IADL Independent with: Bathing;Finances   ADL/IADL Needs Assistance with: Yard work;Dressing;Meal prep   ADL/IADL Fraility Index Vulnerable   6 Minute Walk- Baseline yes   BP (mmHg) 141/55   HR (bpm) 61   02 Sat (%RA) 93 %   Modified Borg Scale for Dyspnea 0- Nothing at all   Perceived Rate of Exertion (Borg) 6-   6 Minute Walk Post Test yes   BP (mmHg) 135/57   HR (bpm) 69   02 Sat (%RA) 99 %   Modified Borg Scale for Dyspnea 3- Moderate shortness of breath or breathing difficulty   Perceived Rate of Exertion (Borg) 13- Somewhat hard   Aerobic Endurance Distance Walked 60  with 2 wheeled RW          Objective measurements completed on examination: See above findings.                               Plan - 2016/11/05 1642    Clinical Impression Statement see below   PT Frequency One time visit   Consulted and Agree with Plan of Care Patient;Family member/caregiver     Clinical Impression Statement: Pt is an 83 yo female presenting to OP PT for evaluation prior to possible TAVR surgery due to severe aortic stenosis. Pt reports onset of worsening shortness of breath and chest pain with exertion over the past year. Pt presents with poor shoulder ROM - otherwise WFL, fair strength, poor balance and very poor aerobic endurance per 6 minute walk test. Pt ambulated a total of 60 feet in 6 minute walk and is only able to ambulate about 30' at one time. Based on the Short Physical Performance Battery, patient has a frailty rating of 1/12 with </= 5/12 considered frail.    Patient demonstrated the following deficits and impairments:     Visit Diagnosis: Other abnormalities of gait and mobility  Abnormal posture  Muscle weakness (generalized)      G-Codes - 2016-11-05 1643  Functional Assessment Tool Used (Outpatient Only) 6 minute walk 55' with RW   Functional Limitation Mobility: Walking and moving around   Mobility: Walking and Moving Around Current Status [phone]) At least 80 percent but less than 100 percent impaired, limited or restricted   Mobility: Walking and Moving Around Goal Status [phone]) At least 80 percent but less than 100 percent impaired, limited or restricted   Mobility: Walking and Moving Around Discharge Status [phone]) At least 80 percent but less than 100 percent impaired, limited or restricted       Problem List Patient Active Problem List   Diagnosis Date Noted  . Severe aortic stenosis   . Diastolic dysfunction   . Chronic stable angina ([place])   . CAD (coronary artery disease)   . Tricuspid regurgitation   . Pulmonary regurgitation   . Left shoulder pain   . SOB (shortness of breath)   . Back pain-similar to pt's angina 12/01/2015  . Carotid artery disease- s/p RCE 12/01/2015  . Bowen's disease 03/16/2015  . Dependent edema 10/30/2014  . Aortic stenosis   . History of allergy to aspirin 06/02/2014  . PAF (paroxysmal atrial fibrillation) ([place])   . GERD (gastroesophageal reflux disease) 12/05/2011  . Hx of CABG 2006 with Maze 07/21/2011  . Mitral regurgitation 07/21/2011  . Allergic rhinitis 10/05/2010  . Hyperlipidemia 10/05/2010  . Asthma 10/19/2007  . Cough 10/19/2007    Keziyah Kneale, PT 11/01/2016, 4:43 PM  [facility] [address] [place], [place], [address] Phone: [phone]   Fax:  [phone]  Name: [name] MRN: [id number] Date of Birth: [birth year]

## 2016-11-06 NOTE — Telephone Encounter (Signed)
Thanks Lauren!

## 2016-11-08 ENCOUNTER — Institutional Professional Consult (permissible substitution) (INDEPENDENT_AMBULATORY_CARE_PROVIDER_SITE_OTHER): Payer: Medicare Other | Admitting: Cardiothoracic Surgery

## 2016-11-08 ENCOUNTER — Encounter: Payer: Self-pay | Admitting: Cardiothoracic Surgery

## 2016-11-08 VITALS — BP 131/60 | HR 61 | Resp 16 | Ht 65.0 in | Wt 128.0 lb

## 2016-11-08 DIAGNOSIS — I25119 Atherosclerotic heart disease of native coronary artery with unspecified angina pectoris: Secondary | ICD-10-CM

## 2016-11-08 DIAGNOSIS — Z951 Presence of aortocoronary bypass graft: Secondary | ICD-10-CM | POA: Diagnosis not present

## 2016-11-08 DIAGNOSIS — I350 Nonrheumatic aortic (valve) stenosis: Secondary | ICD-10-CM | POA: Diagnosis not present

## 2016-11-08 DIAGNOSIS — Z9889 Other specified postprocedural states: Secondary | ICD-10-CM | POA: Diagnosis not present

## 2016-11-08 NOTE — Progress Notes (Signed)
PCP is Denita Lung, MD Referring Provider is Burnell Blanks*  Chief Complaint  Patient presents with  . Aortic Stenosis    severe..eval for REDO vs TAVR...all testing completed   Patient examined, coronary angiogram images, right heart cath data, and transthoracic echocardiogram images all personally reviewed and counseled with patient.  HPI: Very nice 83 year old Caucasian female presents to the office today for evaluation of recently diagnosed severe aortic stenosis. She is in a wheelchair accompanied by her daughter. Over the past 3 months she has had progressing dyspnea on exertion, fatigue with minimal activity and inability to do her ADLs without problem. She has had some shortness of breath but no discrete chest pain. She has had no syncopal episodes. She has some intermittent palpitations. An echocardiogram performed May 2018 showed severe aortic stenosis with a calcified aortic valve, valve area 0.7 with calculated peak transvalvular gradient greater than 100 mmHg. LV systolic function was preserved. She has moderate MR.  The patient has severe coronary disease and is status post CABG 4 with combined maze procedure in 2006. Her recent left heart cath demonstrated widely patent grafts to her LAD, circumflex marginal, and posterior descending. Both her LAD and RCA are totally occluded proximally. The patient initially was in sinus rhythm for several years after her CABG-maze but recently has developed atrial fibrillation. She is on ELIQUIS 2.5 twice a day for possible mural thrombus. Patient has normal right heart pressures and normal cardiac index.  The patient denies ankle edema ascites or orthopnea. Her exercise tolerance has dramatically decreased over the past 2-3 months.  The patient has active dental complaints with some tenderness and swelling over her left gumline. She has sought dental therapy however could not afford the cost.  The patient had cardiac  catheterization via right femoral artery and has a small hematoma with some ecchymoses.  Patient has had recent carotid Dopplers which show mild-moderate stenosis less than 70% bilaterally.  The patient appears very frail and deconditioned and would not benefit from surgical aVR and would be very high risk for poor outcome.  Past Medical History:  Diagnosis Date  . Arthritis    OSTEO  . Aspirin allergy    on Plavix  . Asthma   . Bronchitis   . CAD (coronary artery disease)    a. s/p CABG 2006 with Cox-Maze procedure.  . Carotid artery disease ([place])    a. s/p R CEA.  . Chronic stable angina ([place])   . Diastolic dysfunction   . Eczema   . Fibromyalgia   . GERD (gastroesophageal reflux disease)   . Heart murmur   . Hiatal hernia   . Hyperlipidemia   . Hypertension   . Kyphoscoliosis   . Mitral regurgitation   . Myocardial infarction [facility]) 2003   Stent to CFX  . PAF (paroxysmal atrial fibrillation) ([place])    a. pt has h/o hematuria on Eliquis and has since refused anticoagulation  . Pneumonia 2015 ?  Marland Kitchen Pulmonary regurgitation   . Severe aortic stenosis   . Skin cancer of arm   . Stroke ([place])   . Tricuspid regurgitation     Past Surgical History:  Procedure Laterality Date  . ABDOMINAL HYSTERECTOMY  1976  . CARDIAC SURGERY  stents  . CAROTID ENDARTERECTOMY  2010  . CORONARY ARTERY BYPASS GRAFT  01/2005   LIMA-D1; SVG-LAD; SVG-OM; SVG-PDA  . RIGHT/LEFT HEART CATH AND CORONARY/GRAFT ANGIOGRAPHY N/A 10/27/2016   Procedure: Right/Left Heart Cath and Coronary/Graft Angiography;  Surgeon:  Burnell Blanks, MD;  Location: [place] [facility];  Service: Cardiovascular;  Laterality: N/A;  . TONSILLECTOMY    . TUMOR REMOVAL      Family History  Problem Relation Age of Onset  . Heart failure Mother   . Other Father     Social History Social History  Substance Use Topics  . Smoking status: Never Smoker  . Smokeless tobacco: Former Systems developer    Types: Snuff    Quit date:  05/23/2001  . Alcohol use No    Current Outpatient Prescriptions  Medication Sig Dispense Refill  . acetaminophen (TYLENOL) 650 MG CR tablet Take 650-1,300 mg by mouth 2 (two) times daily as needed for pain.    Marland Kitchen apixaban (ELIQUIS) 2.5 MG TABS tablet Take 1 tablet (2.5 mg total) by mouth 2 (two) times daily. 60 tablet 2  . calcium-vitamin D (OSCAL WITH D) 500-200 MG-UNIT per tablet Take 1 tablet by mouth daily with breakfast.    . cholecalciferol (VITAMIN D) 1000 UNITS tablet Take 1,000 Units by mouth daily.    Marland Kitchen diltiazem (CARDIZEM CD) 180 MG 24 hr capsule TAKE 1 CAPSULE BY MOUTH DAILY 30 capsule 11  . FLOVENT HFA 110 MCG/ACT inhaler inhale 1 puff by mouth twice a day 12 g 1  . fluticasone (FLONASE) 50 MCG/ACT nasal spray instill 2 sprays into each nostril once daily 16 g 3  . guaiFENesin (MUCINEX) 600 MG 12 hr tablet Take 1 tablet (600 mg total) by mouth 2 (two) times daily. 30 tablet 0  . guaiFENesin (ROBITUSSIN) 100 MG/5ML liquid Take 100 mg by mouth 3 (three) times daily as needed for cough. Reported on 12/08/2015    . isosorbide dinitrate (ISORDIL) 30 MG tablet TAKE 1 TABLET BY MOUTH 2 TIMES DAILY 60 tablet 7  . loratadine (CLARITIN) 10 MG tablet Take 10 mg by mouth daily.      . metoprolol tartrate (LOPRESSOR) 25 MG tablet take 1/2 tablet by mouth twice a day 60 tablet 0  . nitroGLYCERIN (NITROSTAT) 0.4 MG SL tablet place 1 tablet UNDER THE TONGUE EVERY 5 MINUTES if needed for chest pain MAX OF 3 TABS (Patient taking differently: Place 0.4 mg under the tongue every 5 (five) minutes as needed for chest pain. place 1 tablet UNDER THE TONGUE EVERY 5 MINUTES if needed for chest pain MAX OF 3 TABS) 25 tablet 6  . pantoprazole (PROTONIX) 40 MG tablet Take 1 tablet (40 mg total) by mouth 2 (two) times daily. 180 tablet 3  . rosuvastatin (CRESTOR) 40 MG tablet take 1 tablet by mouth once daily (Patient taking differently: take 1 tablet by mouth once daily in the evening) 90 tablet 1  . traMADol  (ULTRAM) 50 MG tablet take 2 tablets by mouth three times a day if needed for pain (Patient taking differently: Take 100 mg by mouth 3 (three) times daily as needed (for pain (typically in the evening)). take 2 tablets by mouth three times a day if needed for pain) 60 tablet 3  . valsartan (DIOVAN) 40 MG tablet TAKE 1 TABLET BY MOUTH ONCE DAILY 90 tablet 3  . VENTOLIN HFA 108 (90 Base) MCG/ACT inhaler INHALE 2 PUFFS BY MOUTH EVERY 6 HOURS AS NEEDED FOR WHEEZING OR SHORTNESS OF BREATH 18 Inhaler 5  . diazepam (VALIUM) 2 MG tablet Take 2 mg by mouth every 6 (six) hours as needed (dizziness). Reported on 12/08/2015     No current facility-administered medications for this visit.     Allergies  Allergen Reactions  . Bactrim [Sulfamethoxazole-Trimethoprim] Other (See Comments)    "makes her feel funny" or "unsteady"   . Amitriptyline Hcl Rash  . Aspirin Swelling and Rash  . Zetia [Ezetimibe] Rash    Review of Systems         Review of Systems :  [ y ] = yes, [  ] = no        General :  Weight gain Florencio.Farrier   ]    Weight loss  [ y  ]  Fatigue Blue.Reese  ]  Fever Florencio.Farrier  ]  Chills  [ n ]                                Weakness  [ y ]           HEENT    Headache [  ]  Dizziness [  ]  Blurred vision [  ] Glaucoma  [  ]                          Nosebleeds [  ] Painful or loose teeth [ y ]        Cardiac :  Chest pain/ pressure [  ]  Resting SOB [  ] exertional SOB Blue.Reese  ]                        Orthopnea [  ]  Pedal edema  [  ]  Palpitations [  ] Syncope/presyncope [ ]                         Paroxysmal nocturnal dyspnea [  ] CABG 4 with maze procedure 2006        Pulmonary : cough [ y ]  wheezing [  ]  Hemoptysis [  ] Sputum [  ] Snoring [  ]                              Pneumothorax [  ]  Sleep apnea [  ]        GI : Vomiting [  ]  Dysphagia [  ]  Melena  [  ]  Abdominal pain [  ] BRBPR [  ]              Heart burn [  ]  Constipation [  ] Diarrhea  [  ] Colonoscopy [   ]        GU : Hematuria [ y ]   Dysuria [  ]  Nocturia [  ] UTI's [  ]        Vascular : Claudication [  ]  Rest pain [  ]  DVT [  ] Vein stripping [  ] leg ulcers [  ]                          TIA [  ] Stroke [  ]  Varicose veins [  ]        NEURO :  Headaches  [  ] Seizures [  ] Vision changes [  ] Paresthesias [  ]  Seizures [  ]        Musculoskeletal :  Arthritis Blue.Reese  ] Gout  [  ]  Back pain [  ]  Joint pain [  ]        Skin :  Rash [  ]  Melanoma [  ] Sores [  ]        Heme : Bleeding problems [y  ]Clotting Disorders [  ] Anemia [  ]Blood Transfusion [ ]         Endocrine : Diabetes [  ] Heat or Cold intolerance [  ] Polyuria [  ]excessive thirst [ ]         Psych : Depression [  ]  Anxiety [  ]  Psych hospitalizations [  ] Memory change [  ]                                               BP 131/60 (BP Location: Left Arm, Patient Position: Sitting, Cuff Size: Normal)   Pulse 61   Resp 16   Ht 5\' 5"  (1.651 m)   Wt 128 lb (58.1 kg)   SpO2 94% Comment: ON RA  BMI 21.30 kg/m  Physical Exam      Physical Exam  General: Elderly frail but oriented female no acute distress sitting in a wheelchair accompanied by her daughter HEENT: Normocephalic pupils equal , dentition poor with significant gum recession, discolored teeth Neck: Supple without JVD, adenopathy, or bruit Chest: Clear to auscultation, symmetrical breath sounds, no rhonchi, no tenderness             or deformity. Well-healed sternal incision Cardiovascular: Regular rate and rhythm, 4/6 AS murmur, no gallop, peripheral pulses             palpable in all extremities Abdomen:  Soft, nontender, no palpable mass or organomegaly Extremities: Warm, well-perfused, no clubbing cyanosis edema or tenderness,              no venous stasis changes of the legs Rectal/GU: Deferred Neuro: Grossly non--focal and symmetrical throughout Skin: Clean and dry without rash or ulceration   Diagnostic Tests: Cardiac cath,  echocardiogram, right heart cath data all demonstrate severe aortic stenosis, good LV systolic function, widely patent functional bypass grafts to the LAD circumflex and RCA, and preserved right heart function with atrial fibrillation rate controlled  Impression: Patient has severe aortic stenosis The patient would not survive or benefit from surgical aVR because of her advanced age, fragility, and previous cardiac surgery.  Plan: TAVR would be her best long-term therapy for severe aortic stenosis. She will need dental evaluation prior to valve surgery.   Len Childs, MD [facility] [phone]

## 2016-11-15 ENCOUNTER — Other Ambulatory Visit (HOSPITAL_COMMUNITY): Payer: Medicare Other | Admitting: Dentistry

## 2016-11-15 NOTE — Telephone Encounter (Signed)
error 

## 2016-11-16 ENCOUNTER — Encounter (HOSPITAL_COMMUNITY): Payer: Self-pay | Admitting: Dentistry

## 2016-11-16 ENCOUNTER — Ambulatory Visit (HOSPITAL_COMMUNITY): Payer: Self-pay | Admitting: Dentistry

## 2016-11-16 VITALS — BP 121/51 | HR 56 | Temp 98.2°F

## 2016-11-16 DIAGNOSIS — K031 Abrasion of teeth: Secondary | ICD-10-CM

## 2016-11-16 DIAGNOSIS — Z9189 Other specified personal risk factors, not elsewhere classified: Secondary | ICD-10-CM

## 2016-11-16 DIAGNOSIS — Z01818 Encounter for other preprocedural examination: Secondary | ICD-10-CM

## 2016-11-16 DIAGNOSIS — K011 Impacted teeth: Secondary | ICD-10-CM

## 2016-11-16 DIAGNOSIS — K045 Chronic apical periodontitis: Secondary | ICD-10-CM

## 2016-11-16 DIAGNOSIS — K08409 Partial loss of teeth, unspecified cause, unspecified class: Secondary | ICD-10-CM

## 2016-11-16 DIAGNOSIS — I350 Nonrheumatic aortic (valve) stenosis: Secondary | ICD-10-CM | POA: Diagnosis not present

## 2016-11-16 DIAGNOSIS — K06020 Generalized gingival recession, unspecified: Secondary | ICD-10-CM

## 2016-11-16 DIAGNOSIS — M264 Malocclusion, unspecified: Secondary | ICD-10-CM

## 2016-11-16 DIAGNOSIS — K0820 Unspecified atrophy of edentulous alveolar ridge: Secondary | ICD-10-CM

## 2016-11-16 DIAGNOSIS — K036 Deposits [accretions] on teeth: Secondary | ICD-10-CM

## 2016-11-16 DIAGNOSIS — M898X0 Other specified disorders of bone, multiple sites: Secondary | ICD-10-CM

## 2016-11-16 DIAGNOSIS — K083 Retained dental root: Secondary | ICD-10-CM

## 2016-11-16 DIAGNOSIS — K029 Dental caries, unspecified: Secondary | ICD-10-CM

## 2016-11-16 DIAGNOSIS — K0401 Reversible pulpitis: Secondary | ICD-10-CM

## 2016-11-16 DIAGNOSIS — K0889 Other specified disorders of teeth and supporting structures: Secondary | ICD-10-CM

## 2016-11-16 DIAGNOSIS — K0530 Chronic periodontitis, unspecified: Secondary | ICD-10-CM

## 2016-11-16 NOTE — Patient Instructions (Signed)
[place]    Department of Dental Medicine     DR. Jaid Quirion      HEART VALVES AND MOUTH CARE:  FACTS:   If you have any infection in your mouth, it can infect your heart valve.  If you heart valve is infected, you will be seriously ill.  Infections in the mouth can be SILENT and do not always cause pain.  Examples of infections in the mouth are gum disease, dental cavities, and abscesses.  Some possible signs of infection are: Bad breath, bleeding gums, or teeth that are sensitive to sweets, hot, and/or cold. There are many other signs as well.  WHAT YOU HAVE TO DO:   Brush your teeth after meals and at bedtime. Spend at least 2 minutes brushing well, especially behind your back teeth and all around your teeth that stand alone. Brush at the gumline also.  Do not go to bed without brushing your teeth and flossing.  If you gums bleed when you brush or floss, do NOT stop brushing or flossing. It usually means that your gums need more attention and better cleaning.   If your Dentist or Dr. Artavis Cowie gave you a prescription mouthwash to use, make sure to use it as directed. If you run out of the medication, get a refill at the pharmacy.   If you were given any other medications or directions by your Dentist, please follow them. If you did not understand the directions or forget what you were told, please call. We will be happy to refresh her memory.  If you need antibiotics before dental procedures, make sure you take them one hour prior to every dental visit as directed.   Get a dental checkup every 4-6 months in order to keep your mouth healthy, or to find and treat any new infection. You will most likely need your teeth cleaned or gums treated at the same time.  If you are not able to come in for your scheduled appointment, call your Dentist as soon as possible to reschedule.  If you have a problem in between dental visits, call your Dentist.  

## 2016-11-16 NOTE — Progress Notes (Signed)
DENTAL CONSULTATION  Date of Consultation:  11/16/2016 Patient Name:   [name] Date of Birth:   November 22, 1928 Medical Record Number: [id number]  VITALS: BP (!) 121/51 (BP Location: Left Arm)   Pulse (!) 56   Temp 98.2 F (36.8 C) (Oral)   CHIEF COMPLAINT: The patient is referred by Dr. Tharon Aquas Trigt for a dental consultation.  HPI: [name] is an 83 year old female with severe aortic stenosis. Patient with anticipatedTAVR procedure with Dr. Tharon Aquas Trigt. Patient is now seen as part of a medically necessary pre-heart valve surgery dental protocol examination to rule out dental infection that may affect the patient's systemic health and anticipated heart valve surgery.  Patient currently denies acute toothaches, swellings, or abscesses. Patient does have a history of some tooth pain coming from her lower left molar #19 previously. She indicates this is a dull, achy pain that lasts for minutes to hours at a time. Patient indicates that it did reach an intensity of "12 out of 10" previously. Patient has not seen a dentist for 5-10 years. Patient saw a dentist in [place] but that [name] has since retired. Patient denies having any partial dentures. Patient denies having any dental phobia.  PROBLEM LIST: Patient Active Problem List   Diagnosis Date Noted  . Severe aortic stenosis     Priority: High  . Diastolic dysfunction   . Chronic stable angina ([place])   . CAD (coronary artery disease)   . Tricuspid regurgitation   . Pulmonary regurgitation   . Left shoulder pain   . SOB (shortness of breath)   . Back pain-similar to pt's angina 12/01/2015  . Carotid artery disease- s/p RCE 12/01/2015  . Bowen's disease 03/16/2015  . Dependent edema 10/30/2014  . Aortic stenosis   . History of allergy to aspirin 06/02/2014  . PAF (paroxysmal atrial fibrillation) ([place])   . GERD (gastroesophageal reflux disease) 12/05/2011  . Hx of CABG 2006 with Maze 07/21/2011  . Mitral  regurgitation 07/21/2011  . Allergic rhinitis 10/05/2010  . Hyperlipidemia 10/05/2010  . Asthma 10/19/2007  . Cough 10/19/2007    PMH: Past Medical History:  Diagnosis Date  . Arthritis    OSTEO  . Aspirin allergy    on Plavix  . Asthma   . Bronchitis   . CAD (coronary artery disease)    a. s/p CABG 2006 with Cox-Maze procedure.  . Carotid artery disease ([place])    a. s/p R CEA.  . Chronic stable angina ([place])   . Diastolic dysfunction   . Eczema   . Fibromyalgia   . GERD (gastroesophageal reflux disease)   . Heart murmur   . Hiatal hernia   . Hyperlipidemia   . Hypertension   . Kyphoscoliosis   . Mitral regurgitation   . Myocardial infarction [facility]) 2003   Stent to CFX  . PAF (paroxysmal atrial fibrillation) ([place])    a. pt has h/o hematuria on Eliquis and has since refused anticoagulation  . Pneumonia 2015 ?  Marland Kitchen Pulmonary regurgitation   . Severe aortic stenosis   . Skin cancer of arm   . Stroke ([place])   . Tricuspid regurgitation     PSH: Past Surgical History:  Procedure Laterality Date  . ABDOMINAL HYSTERECTOMY  1976  . CARDIAC SURGERY  stents  . CAROTID ENDARTERECTOMY  2010  . CORONARY ARTERY BYPASS GRAFT  01/2005   LIMA-D1; SVG-LAD; SVG-OM; SVG-PDA  . RIGHT/LEFT HEART CATH AND CORONARY/GRAFT ANGIOGRAPHY N/A 10/27/2016   Procedure: Right/Left  Heart Cath and Coronary/Graft Angiography;  Surgeon: Burnell Blanks, MD;  Location: [facility];  Service: Cardiovascular;  Laterality: N/A;  . TONSILLECTOMY    . TUMOR REMOVAL      ALLERGIES: Allergies  Allergen Reactions  . Bactrim [Sulfamethoxazole-Trimethoprim] Other (See Comments)    "makes her feel funny" or "unsteady"   . Amitriptyline Hcl Rash  . Aspirin Swelling and Rash  . Zetia [Ezetimibe] Rash    MEDICATIONS: Current Outpatient Prescriptions  Medication Sig Dispense Refill  . acetaminophen (TYLENOL) 650 MG CR tablet Take 650-1,300 mg by mouth 2 (two) times daily as needed for pain.     Marland Kitchen apixaban (ELIQUIS) 2.5 MG TABS tablet Take 1 tablet (2.5 mg total) by mouth 2 (two) times daily. 60 tablet 2  . calcium-vitamin D (OSCAL WITH D) 500-200 MG-UNIT per tablet Take 1 tablet by mouth daily with breakfast.    . cholecalciferol (VITAMIN D) 1000 UNITS tablet Take 1,000 Units by mouth daily.    Marland Kitchen diltiazem (CARDIZEM CD) 180 MG 24 hr capsule TAKE 1 CAPSULE BY MOUTH DAILY 30 capsule 11  . FLOVENT HFA 110 MCG/ACT inhaler inhale 1 puff by mouth twice a day 12 g 1  . fluticasone (FLONASE) 50 MCG/ACT nasal spray instill 2 sprays into each nostril once daily 16 g 3  . guaiFENesin (MUCINEX) 600 MG 12 hr tablet Take 1 tablet (600 mg total) by mouth 2 (two) times daily. 30 tablet 0  . guaiFENesin (ROBITUSSIN) 100 MG/5ML liquid Take 100 mg by mouth 3 (three) times daily as needed for cough. Reported on 12/08/2015    . isosorbide dinitrate (ISORDIL) 30 MG tablet TAKE 1 TABLET BY MOUTH 2 TIMES DAILY 60 tablet 7  . loratadine (CLARITIN) 10 MG tablet Take 10 mg by mouth daily.      . metoprolol tartrate (LOPRESSOR) 25 MG tablet take 1/2 tablet by mouth twice a day 60 tablet 0  . nitroGLYCERIN (NITROSTAT) 0.4 MG SL tablet place 1 tablet UNDER THE TONGUE EVERY 5 MINUTES if needed for chest pain MAX OF 3 TABS (Patient taking differently: Place 0.4 mg under the tongue every 5 (five) minutes as needed for chest pain. place 1 tablet UNDER THE TONGUE EVERY 5 MINUTES if needed for chest pain MAX OF 3 TABS) 25 tablet 6  . pantoprazole (PROTONIX) 40 MG tablet Take 1 tablet (40 mg total) by mouth 2 (two) times daily. 180 tablet 3  . rosuvastatin (CRESTOR) 40 MG tablet take 1 tablet by mouth once daily (Patient taking differently: take 1 tablet by mouth once daily in the evening) 90 tablet 1  . traMADol (ULTRAM) 50 MG tablet take 2 tablets by mouth three times a day if needed for pain (Patient taking differently: Take 100 mg by mouth 3 (three) times daily as needed (for pain (typically in the evening)). take 2 tablets  by mouth three times a day if needed for pain) 60 tablet 3  . valsartan (DIOVAN) 40 MG tablet TAKE 1 TABLET BY MOUTH ONCE DAILY 90 tablet 3  . VENTOLIN HFA 108 (90 Base) MCG/ACT inhaler INHALE 2 PUFFS BY MOUTH EVERY 6 HOURS AS NEEDED FOR WHEEZING OR SHORTNESS OF BREATH 18 Inhaler 5  . diazepam (VALIUM) 2 MG tablet Take 2 mg by mouth every 6 (six) hours as needed (dizziness). Reported on 12/08/2015     No current facility-administered medications for this visit.     LABS: Lab Results  Component Value Date   WBC 5.8 10/24/2016  HGB 11.5 10/24/2016   HCT 35.1 10/24/2016   MCV 91 10/24/2016   PLT 214 10/24/2016      Component Value Date/Time   NA 143 10/24/2016 1240   K 4.5 10/24/2016 1240   CL 103 10/24/2016 1240   CO2 27 10/24/2016 1240   GLUCOSE 105 (H) 10/24/2016 1240   GLUCOSE 126 (H) 11/30/2015 1730   BUN 13 10/24/2016 1240   CREATININE 0.44 (L) 10/24/2016 1240   CREATININE 0.41 (L) 08/21/2015 0001   CALCIUM 10.0 10/24/2016 1240   GFRNONAA 92 10/24/2016 1240   GFRAA 106 10/24/2016 1240   Lab Results  Component Value Date   INR 1.0 10/24/2016   INR 1.17 05/20/2014   INR 0.9 12/29/2008   No results found for: PTT  SOCIAL HISTORY: Social History   Social History  . Marital status: Widowed    Spouse name: N/A  . Number of children: 3  . Years of education: N/A   Occupational History  .  Retired   Social History Main Topics  . Smoking status: Never Smoker  . Smokeless tobacco: Former Systems developer    Types: Snuff    Quit date: 05/23/2001  . Alcohol use No  . Drug use: No  . Sexual activity: Not Currently   Other Topics Concern  . Not on file   Social History Narrative  . No narrative on file    FAMILY HISTORY: Family History  Problem Relation Age of Onset  . Heart failure Mother   . Other Father     REVIEW OF SYSTEMS: Reviewed with the patient as per history of present illness. Psych: Patient denies having dental phobia.  DENTAL HISTORY: CHIEF  COMPLAINT: The patient is referred by Dr. Tharon Aquas Trigt for a dental consultation.  HPI: MEDINA DEGRAFFENREID is an 83 year old female with severe aortic stenosis. Patient with anticipatedTAVR procedure with Dr. Tharon Aquas Trigt. Patient is now seen as part of a medically necessary pre-heart valve surgery dental protocol examination to rule out dental infection that may affect the patient's systemic health and anticipated heart valve surgery.  Patient currently denies acute toothaches, swellings, or abscesses. Patient does have a history of some tooth pain coming from her lower left molar #19 previously. She indicates this is a dull, achy pain that lasts for minutes to hours at a time. Patient indicates that it did reach an intensity of "12 out of 10" previously. Patient has not seen a dentist for 5-10 years. Patient saw a dentist in [place] but that [name] has since retired. Patient denies having any partial dentures. Patient denies having any dental phobia.  DENTAL EXAMINATION: GENERAL: The patient appears to be a well-developed, well-nourished female in no acute distress. Patient presents in a wheelchair for ambulation. HEAD AND NECK: There is no palpable neck lymphadenopathy. The patient denies acute TMJ symptoms. INTRAORAL EXAM: The patient has xerostomia. There is no evidence of oral abscess formation. There are facial exostoses in the area of tooth #6 through 11.  There are periodontal defects involving the facial vestibule in the area of tooth numbers 22 and 24. DENTITION: The patient is missing tooth numbers 1, 14-16, 21, and 30-32. Patient has impacted tooth numbers 6 and 11. These impacted teeth are not noted clinically. Patient has multiple retained root segments in the area of tooth numbers 2, 5, 8, 9, 22, and 24. PERIODONTAL: Patient has chronic periodontitis with plaque and calculus accumulations, generalized gingival recession, and generalized tooth mobility. There is moderate to severe  bone loss noted. DENTAL CARIES/SUBOPTIMAL RESTORATIONS: Multiple dental caries and abfraction lesions are noted as per dental charting form. ENDODONTIC: The patient has a history of acute pulpitis symptoms but none today. There are multiple areas of periapical pathology and radiolucency. CROWN AND BRIDGE: There are no crown or bridge restorations. PROSTHODONTIC: Patient denies ever having had partial dentures. OCCLUSION: Patient has a poor occlusal scheme secondary to multiple missing teeth, multiple retained root segments, and lack replacement of missing teeth with dental prostheses.  RADIOGRAPHIC INTERPRETATION: An orthopantogram was taken and supplemented with a full series of dental radiographs. There are multiple missing teeth. There are impacted tooth numbers 6 and 11. There multiple retained root segments. There is moderate to severe bone loss noted. There is supra-eruption and drifting of the unopposed teeth into the edentulous areas. Multiple dental caries and flexure lesions are noted. There are multiple areas of periapical pathology and radiolucency.   ASSESSMENTS: 1. Severe aortic stenosis 2. Pre-heart valve surgery dental protocol 3. Chronic apical periodontitis 4. History of acute pulpitis 5. Dental caries 6. Multiple retained root segments 7. Multiple flexure lesions 8. Facial exostoses in the area of tooth #6 through 11 9. Chronic periodontitis of bone loss 10. Generalized gingival recession 11. Accretions 12. Loose teeth 13. Multiple missing teeth 14. Impacted tooth numbers 6 and 11 15. Poor occlusal scheme and malocclusion 16. Risk for bleeding with invasive dental procedures due to current Eliquis therapy 17. Risk for complications up to and including death with anticipated invasive dental procedures in the operating room secondary to significant cardiovascular and respiratory compromise.   PLAN/RECOMMENDATIONS: 1. I discussed the risks, benefits, and complications  of various treatment options with the patient and her daughter in relationship to her medical and dental conditions, anticipated heart valve surgery, and risk for endocarditis. We discussed various treatment options to include no treatment, multiple extractions with alveoloplasty, pre-prosthetic surgery as indicated, periodontal therapy, dental restorations, root canal therapy, crown and bridge therapy, implant therapy, and replacement of missing teeth as indicated. We also discussed referral to an oral surgeon for extraction of teeth including the impacted tooth numbers 6 and 11. The patient and daughter currently refuse referral to an oral surgeon.  The patient currently wishes to proceed with extraction of remaining teeth with the exception of impacted tooth numbers 6 and 11 with alveoloplasty and pre-prosthetic surgery as needed in the operating room with general anesthesia. Patient will need to be off of her Eliquis therapy for 3 days prior to the oral surgery. I will need to consult with Dr. Angelena Form and Dr. Prescott Gum concerning the ability to discontinue Eliquis therapy at this time. Alternatively, the patient may be scheduled in the future to allow for additional Eliquis therapy before discontinuing the medication. In the meantime, the patient is to follow up with Dr. Cyndia Bent for his consultation on 11/22/2016.  After the oral surgery, the patient will be scheduled for TAVR procedure as indicated at the discretion of Dr. Prescott Gum. Patient will then proceed with fabrication of upper and lower complete dentures after adequate healing and once medically stable from the anticipated heart valve surgery.  2. Discussion of findings with medical team and coordination of future medical and dental care as needed.  I spent in excess of  120 minutes during the conduct of this consultation and >50% of this time involved direct face-to-face encounter for counseling and/or coordination of the patient's  care.    Lenn Cal, DDS

## 2016-11-22 ENCOUNTER — Encounter: Payer: Medicare Other | Admitting: Surgery

## 2016-12-05 ENCOUNTER — Telehealth: Payer: Self-pay | Admitting: Family Medicine

## 2016-12-05 ENCOUNTER — Other Ambulatory Visit: Payer: Self-pay | Admitting: Family Medicine

## 2016-12-05 NOTE — Telephone Encounter (Signed)
Patient called about her metoprolol rx Apparently she received call that she needs appointment, she states that she can not come in right now  She has multiple health issues going on  She is going into hospital to have all her teeth removed, she has to have  Aortic valve replaced and she has a blood clot  She would like to get refills and hold off on coming in for a while since she has all these other issues going on

## 2016-12-05 NOTE — Telephone Encounter (Signed)
Is this okay to refill? This pt hasn't been in, in almost a year

## 2016-12-05 NOTE — Telephone Encounter (Signed)
Called and left message for pt to call back and schedule med check as its almost been a year since being in.

## 2016-12-05 NOTE — Telephone Encounter (Signed)
Dr.lalonde already refilled her meds

## 2016-12-07 ENCOUNTER — Encounter: Payer: Self-pay | Admitting: Surgery

## 2016-12-07 ENCOUNTER — Institutional Professional Consult (permissible substitution) (INDEPENDENT_AMBULATORY_CARE_PROVIDER_SITE_OTHER): Payer: Medicare Other | Admitting: Surgery

## 2016-12-07 VITALS — BP 135/72 | HR 60 | Resp 16 | Ht 65.0 in | Wt 128.0 lb

## 2016-12-07 DIAGNOSIS — I25119 Atherosclerotic heart disease of native coronary artery with unspecified angina pectoris: Secondary | ICD-10-CM | POA: Diagnosis not present

## 2016-12-07 DIAGNOSIS — Z9889 Other specified postprocedural states: Secondary | ICD-10-CM | POA: Diagnosis not present

## 2016-12-07 DIAGNOSIS — Z951 Presence of aortocoronary bypass graft: Secondary | ICD-10-CM

## 2016-12-07 DIAGNOSIS — I350 Nonrheumatic aortic (valve) stenosis: Secondary | ICD-10-CM | POA: Diagnosis not present

## 2016-12-12 ENCOUNTER — Encounter: Payer: Self-pay | Admitting: Surgery

## 2016-12-12 NOTE — Progress Notes (Signed)
Patient ID: [name], female   DOB: 11-11-1928, 83 y.o.   MRN: [id number]  [facility]GERY CONSULTATION REPORT  Referring Provider is Burnell Blanks* PCP is Denita Lung, MD  Chief Complaint  Patient presents with  . Aortic Stenosis    2nd TAVR eval, review all studies, dental consultation 11/16/16    HPI:  The patient is an 83 year old woman with hypertension, hyperlipidemia, atrial fibrillation and CAD s/p CABG and MAZE in 2006 by Dr. Prescott Gum. She was found to have severe AS by cho in 11/2015 with a mean gradient of 47 mm Hg. She did not want to pursue further evaluation at that time. She has had atrial fibrillation bu refused anticoagulation due to hematuria on Eliquis in 2015. Her most recent echo on 10/14/2016 showed progression of her AS with an increase in the mean gradient to 54 mm Hg with a peak of 101. The LVEF was 60-65% with grade 2 diastolic dysfunction. Cardiac cath on 10/27/2016 showed severe native 3 vessel CAD with 3/4 patent bypass grafts. The mean AV gradient was 41.5 mm Hg with a peak to peak of 41 mm Hg. She is not very active due to difficulty with balance requiring a walker but reports exertional dyspnea with minimal activity. She has had some chest pain with exertion and dizziness with blurred vision. She feels like this has worsened over the past 6 months.  A filling defect was seen at the tip of the left atrial appendage on CTA of the chest concerning for possible left atrial thrombus. After discussion with the team she was started on Eliquis for a month preop.  She has been evaluated by Dr. Enrique Sack and will require extraction of multiple teeth preop. She is currently on Eliquis and that will have to be stopped prior to the extractions.  Past Medical History:  Diagnosis Date  . Arthritis    OSTEO  . Aspirin allergy    on Plavix  . Asthma   . Bronchitis   . CAD (coronary  artery disease)    a. s/p CABG 2006 with Cox-Maze procedure.  . Carotid artery disease ([place])    a. s/p R CEA.  . Chronic stable angina ([place])   . Diastolic dysfunction   . Eczema   . Fibromyalgia   . GERD (gastroesophageal reflux disease)   . Heart murmur   . Hiatal hernia   . Hyperlipidemia   . Hypertension   . Kyphoscoliosis   . Mitral regurgitation   . Myocardial infarction [facility]) 2003   Stent to CFX  . PAF (paroxysmal atrial fibrillation) ([place])    a. pt has h/o hematuria on Eliquis and has since refused anticoagulation  . Pneumonia 2015 ?  Marland Kitchen Pulmonary regurgitation   . Severe aortic stenosis   . Skin cancer of arm   . Stroke ([place])   . Tricuspid regurgitation     Past Surgical History:  Procedure Laterality Date  . ABDOMINAL HYSTERECTOMY  1976  . CARDIAC SURGERY  stents  . CAROTID ENDARTERECTOMY  2010  . CORONARY ARTERY BYPASS GRAFT  01/2005   LIMA-D1; SVG-LAD; SVG-OM; SVG-PDA  . RIGHT/LEFT HEART CATH AND CORONARY/GRAFT ANGIOGRAPHY N/A 10/27/2016   Procedure: Right/Left Heart Cath and Coronary/Graft Angiography;  Surgeon: Burnell Blanks, MD;  Location: [facility];  Service: Cardiovascular;  Laterality: N/A;  . TONSILLECTOMY    . TUMOR REMOVAL      Family  History  Problem Relation Age of Onset  . Heart failure Mother   . Other Father     Social History   Social History  . Marital status: Widowed    Spouse name: N/A  . Number of children: 3  . Years of education: N/A   Occupational History  .  Retired   Social History Main Topics  . Smoking status: Never Smoker  . Smokeless tobacco: Former Systems developer    Types: Snuff    Quit date: 05/23/2001  . Alcohol use No  . Drug use: No  . Sexual activity: Not Currently   Other Topics Concern  . Not on file   Social History Narrative  . No narrative on file    Current Outpatient Prescriptions  Medication Sig Dispense Refill  . acetaminophen (TYLENOL) 650 MG CR tablet Take 650-1,300 mg by mouth 2  (two) times daily as needed for pain.    Marland Kitchen apixaban (ELIQUIS) 2.5 MG TABS tablet Take 1 tablet (2.5 mg total) by mouth 2 (two) times daily. 60 tablet 2  . calcium-vitamin D (OSCAL WITH D) 500-200 MG-UNIT per tablet Take 1 tablet by mouth daily with breakfast.    . cholecalciferol (VITAMIN D) 1000 UNITS tablet Take 1,000 Units by mouth daily.    . diazepam (VALIUM) 2 MG tablet Take 2 mg by mouth every 6 (six) hours as needed (dizziness). Reported on 12/08/2015    . diltiazem (CARDIZEM CD) 180 MG 24 hr capsule TAKE 1 CAPSULE BY MOUTH DAILY 30 capsule 11  . FLOVENT HFA 110 MCG/ACT inhaler inhale 1 puff by mouth twice a day 12 g 1  . fluticasone (FLONASE) 50 MCG/ACT nasal spray instill 2 sprays into each nostril once daily 16 g 3  . guaiFENesin (MUCINEX) 600 MG 12 hr tablet Take 1 tablet (600 mg total) by mouth 2 (two) times daily. 30 tablet 0  . guaiFENesin (ROBITUSSIN) 100 MG/5ML liquid Take 100 mg by mouth 3 (three) times daily as needed for cough. Reported on 12/08/2015    . isosorbide dinitrate (ISORDIL) 30 MG tablet TAKE 1 TABLET BY MOUTH 2 TIMES DAILY 60 tablet 7  . loratadine (CLARITIN) 10 MG tablet Take 10 mg by mouth daily.      . metoprolol tartrate (LOPRESSOR) 25 MG tablet take 1/2 tablet by mouth twice a day 60 tablet 11  . nitroGLYCERIN (NITROSTAT) 0.4 MG SL tablet place 1 tablet UNDER THE TONGUE EVERY 5 MINUTES if needed for chest pain MAX OF 3 TABS (Patient taking differently: Place 0.4 mg under the tongue every 5 (five) minutes as needed for chest pain. place 1 tablet UNDER THE TONGUE EVERY 5 MINUTES if needed for chest pain MAX OF 3 TABS) 25 tablet 6  . pantoprazole (PROTONIX) 40 MG tablet Take 1 tablet (40 mg total) by mouth 2 (two) times daily. 180 tablet 3  . rosuvastatin (CRESTOR) 40 MG tablet take 1 tablet by mouth once daily (Patient taking differently: take 1 tablet by mouth once daily in the evening) 90 tablet 1  . traMADol (ULTRAM) 50 MG tablet take 2 tablets by mouth three times  a day if needed for pain (Patient taking differently: Take 100 mg by mouth 3 (three) times daily as needed (for pain (typically in the evening)). take 2 tablets by mouth three times a day if needed for pain) 60 tablet 3  . valsartan (DIOVAN) 40 MG tablet TAKE 1 TABLET BY MOUTH ONCE DAILY 90 tablet 3  . VENTOLIN HFA 108 (90  Base) MCG/ACT inhaler INHALE 2 PUFFS BY MOUTH EVERY 6 HOURS AS NEEDED FOR WHEEZING OR SHORTNESS OF BREATH 18 Inhaler 5   No current facility-administered medications for this visit.     Allergies  Allergen Reactions  . Bactrim [Sulfamethoxazole-Trimethoprim] Other (See Comments)    "makes her feel funny" or "unsteady"   . Amitriptyline Hcl Rash  . Aspirin Swelling and Rash  . Zetia [Ezetimibe] Rash      Review of Systems:   General:  normal appetite, reduced energy, no weight gain, no weight loss, no fever  Cardiac:  has chest pain with exertion, no chest pain at rest, has SOB with minimal exertion, no resting SOB, no PND, no orthopnea, has palpitations, has arrhythmia, has atrial fibrillation, no LE edema, has dizzy spells, no syncope  Respiratory:  exertional shortness of breath, no home oxygen, no productive cough, no dry cough, no bronchitis, no wheezing, no hemoptysis, no asthma, no pain with inspiration or cough, no sleep apnea, no CPAP at night  GI:   no difficulty swallowing, has reflux, no frequent heartburn, has hiatal hernia, no abdominal pain, no constipation, no diarrhea, no hematochezia, no hematemesis, no melena  GU:   no dysuria,  no frequency, no urinary tract infection, no hematuria, no kidney stones, no kidney disease  Vascular:  no pain suggestive of claudication, no pain in feet, no leg cramps, no varicose veins, no DVT, no non-healing foot ulcer  Neuro:   prior stroke, no TIA's, no seizures, no headaches, no temporary blindness one eye,  no slurred speech, no peripheral neuropathy, no chronic pain, has instability of gait, no memory/cognitive  dysfunction  Musculoskeletal: has arthritis, no joint swelling, has myalgias, has difficulty walking, reduced mobility   Skin:   no rash, no itching, no skin infections, no pressure sores or ulcerations, skin cancer  Psych:   no anxiety, no depression, no nervousness, no unusual recent stress  Eyes:   no blurry vision, no floaters, no recent vision changes,  wears glasses   ENT:   has hearing loss, has loose or painful teeth, no dentures, saw Dr. Enrique Sack recently and requires extractions.  Hematologic:  no easy bruising, no abnormal bleeding, no clotting disorder, no frequent epistaxis  Endocrine:  no diabetes, does not check CBG's at home       Physical Exam:   BP 135/72 (BP Location: Left Arm, Patient Position: Sitting, Cuff Size: Normal)   Pulse 60   Resp 16   Ht 5\' 5"  (1.651 m)   Wt 128 lb (58.1 kg)   SpO2 97% Comment: RA  BMI 21.30 kg/m   General:  Elderly and frail but  well-appearing, in a wheel chair  HEENT:  Unremarkable, NCAT, PERLA, EOMI, oropharynx clear. Teeth in poor condition  Neck:   no JVD, no bruits, no adenopathy or thyromegaly  Chest:   clear to auscultation, symmetrical breath sounds, no wheezes, no rhonchi   CV:   RRR, grade III/VI crescendo/decrescendo murmur heard best at RSB,  no diastolic murmur  Abdomen:  soft, non-tender, no masses or organomegaly  Extremities:  warm, well-perfused, pulses palpable, no LE edema  Rectal/GU  Deferred  Neuro:   Grossly non-focal and symmetrical throughout  Skin:   Clean and dry, no rashes, no breakdown   Diagnostic Tests:    [name]                        [address]  [place], [place] [address]                            [phone]  ------------------------------------------------------------------- Transthoracic Echocardiography  Patient:    Suzan, Manon MR #:       [id number] Study Date: 10/14/2016 Gender:     F Age:        24 Height:     165.1 cm Weight:     59  kg BSA:        1.65 m^2 Pt. Status: Room:   ATTENDING    Loralie Champagne, M.D.  SONOGRAPHER  Wyatt Mage, RDCS  ORDERING     Mina, Christopher  REFERRING    McAlhany, Christopher  PERFORMING   [facility], Outpatient  cc:  ------------------------------------------------------------------- LV EF: 60% -   65%  ------------------------------------------------------------------- Indications:      Aortic stenosis (I35).  ------------------------------------------------------------------- History:   PMH:   Dyspnea.  Atrial fibrillation.  Coronary artery disease.  Aortic valve disease.  Risk factors:  Edema. Carotid stenosis. Dyslipidemia.  ------------------------------------------------------------------- Study Conclusions  - Left ventricle: The cavity size was normal. Wall thickness was   increased in a pattern of mild LVH. Systolic function was normal.   The estimated ejection fraction was in the range of 60% to 65%.   Wall motion was normal; there were no regional wall motion   abnormalities. Features are consistent with a pseudonormal left   ventricular filling pattern, with concomitant abnormal relaxation   and increased filling pressure (grade 2 diastolic dysfunction). - Aortic valve: Trileaflet; severely calcified leaflets. There was   severe stenosis. Mean gradient (S): 54 mm Hg. Peak gradient (S):   101 mm Hg. Valve area (VTI): 0.45 cm^2. - Mitral valve: Mildly to moderately calcified annulus. Moderately   calcified leaflets . There was mild regurgitation. - Left atrium: The atrium was mildly dilated. - Right ventricle: The cavity size was normal. Systolic function   was normal. - Tricuspid valve: Peak RV-RA gradient (S): 36 mm Hg. - Pulmonary arteries: PA peak pressure: 44 mm Hg (S). - Systemic veins: IVC measured 1.7 cm with < 50% respirophasic   variation, suggesting RA pressure 8 mmHg.  Impressions:  - Normal LV size with mild LV hypertrophy. EF 60-65%.  Moderate   diastolic dysfunction. Severe aortic stenosis. Normal RV size and   systolic function. Mild MR. Mild pulmonary hypertension.  ------------------------------------------------------------------- Labs, prior tests, procedures, and surgery: Transthoracic echocardiography (12/03/2015).    The aortic valve showed severe stenosis.  EF was 60% and PA pressure was 45 (systolic). Aortic valve: peak gradient of 80 mm Hg and mean gradient of 47 mm Hg.  Coronary artery bypass grafting.  ------------------------------------------------------------------- Study data:  Comparison was made to the study of 12/20/2015.  Study status:  Routine.  Procedure:  The patient reported no pain pre or post test. Transthoracic echocardiography. Image quality was adequate. The study was technically difficult, as a result of poor patient compliance.  Study completion:  There were no complications.          Transthoracic echocardiography.  M-mode, complete 2D, spectral Doppler, and color Doppler.  Birthdate: Patient birthdate: [birth year].  Age:  Patient is 83 yr old.  Sex: Gender: female.    BMI: 21.6 kg/m^2.  Blood pressure:     118/70 Patient status:  Outpatient.  Study date:  Study date: 10/14/2016. Study time: 01:01 PM.  Location:  [facility]  -------------------------------------------------------------------  ------------------------------------------------------------------- Left ventricle:  The cavity size was normal. Wall thickness was increased in a pattern of mild LVH. Systolic function was normal. The estimated ejection fraction was in the range of 60% to 65%. Wall motion was normal; there were no regional wall motion abnormalities. Features are consistent with a pseudonormal left ventricular filling pattern, with concomitant abnormal relaxation and increased filling pressure (grade 2 diastolic  dysfunction).  ------------------------------------------------------------------- Aortic valve:   Trileaflet; severely calcified leaflets.  Doppler:  There was severe stenosis.   There was no regurgitation.    VTI ratio of LVOT to aortic valve: 0.14. Valve area (VTI): 0.45 cm^2. Indexed valve area (VTI): 0.27 cm^2/m^2. Peak velocity ratio of LVOT to aortic valve: 0.14. Valve area (Vmax): 0.44 cm^2. Indexed valve area (Vmax): 0.27 cm^2/m^2. Mean velocity ratio of LVOT to aortic valve: 0.14. Valve area (Vmean): 0.44 cm^2. Indexed valve area (Vmean): 0.27 cm^2/m^2.    Mean gradient (S): 54 mm Hg. Peak gradient (S): 101 mm Hg.  ------------------------------------------------------------------- Aorta:  Aortic root: The aortic root was normal in size. Ascending aorta: The ascending aorta was normal in size.  ------------------------------------------------------------------- Mitral valve:   Mildly to moderately calcified annulus. Moderately calcified leaflets .  Doppler:   There was no evidence for stenosis.   There was mild regurgitation.    Valve area by pressure half-time: 3.38 cm^2. Indexed valve area by pressure half-time: 2.05 cm^2/m^2.    Peak gradient (D): 9 mm Hg.  ------------------------------------------------------------------- Left atrium:  The atrium was mildly dilated.  ------------------------------------------------------------------- Right ventricle:  The cavity size was normal. Systolic function was normal.  ------------------------------------------------------------------- Pulmonic valve:    Structurally normal valve.   Cusp separation was normal.  Doppler:  Transvalvular velocity was within the normal range. There was trivial regurgitation.  ------------------------------------------------------------------- Tricuspid valve:   Doppler:  There was trivial regurgitation.   ------------------------------------------------------------------- Right atrium:   The atrium was normal in size.  ------------------------------------------------------------------- Pericardium:  There was no pericardial effusion.  ------------------------------------------------------------------- Systemic veins:  IVC measured 1.7 cm with < 50% respirophasic variation, suggesting RA pressure 8 mmHg.  ------------------------------------------------------------------- Measurements   Left ventricle                            Value          Reference  LV ID, ED, PLAX chordal           (L)     37    mm       43 - 52  LV ID, ES, PLAX chordal                   28    mm       23 - 38  LV fx shortening, PLAX chordal    (L)     24    %        >=29  LV PW thickness, ED                       9     mm       ---------  IVS/LV PW ratio, ED                       1.22           <=1.3  Stroke volume, 2D  63    ml       ---------  Stroke volume/bsa, 2D                     38    ml/m^2   ---------  LV e&', lateral                            6.25  cm/s     ---------  LV E/e&', lateral                          24             ---------  LV e&', medial                             5.7   cm/s     ---------  LV E/e&', medial                           26.32          ---------  LV e&', average                            5.98  cm/s     ---------  LV E/e&', average                          25.1           ---------    Ventricular septum                        Value          Reference  IVS thickness, ED                         11    mm       ---------    LVOT                                      Value          Reference  LVOT ID, S                                20    mm       ---------  LVOT area                                 3.14  cm^2     ---------  LVOT peak velocity, S                     71.1  cm/s     ---------  LVOT mean velocity, S                     48.2  cm/s     ---------  LVOT VTI, S  20    cm       ---------    Aortic  valve                              Value          Reference  Aortic valve peak velocity, S             502   cm/s     ---------  Aortic valve mean velocity, S             341   cm/s     ---------  Aortic valve VTI, S                       139   cm       ---------  Aortic mean gradient, S                   54    mm Hg    ---------  Aortic peak gradient, S                   101   mm Hg    ---------  VTI ratio, LVOT/AV                        0.14           ---------  Aortic valve area, VTI                    0.45  cm^2     ---------  Aortic valve area/bsa, VTI                0.27  cm^2/m^2 ---------  Velocity ratio, peak, LVOT/AV             0.14           ---------  Aortic valve area, peak velocity          0.44  cm^2     ---------  Aortic valve area/bsa, peak               0.27  cm^2/m^2 ---------  velocity  Velocity ratio, mean, LVOT/AV             0.14           ---------  Aortic valve area, mean velocity          0.44  cm^2     ---------  Aortic valve area/bsa, mean               0.27  cm^2/m^2 ---------  velocity    Aorta                                     Value          Reference  Aortic root ID, ED                        30    mm       ---------    Left atrium                               Value  Reference  LA ID, A-P, ES                            32    mm       ---------  LA ID/bsa, A-P                            1.94  cm/m^2   <=2.2  LA volume, ES, 1-p A4C                    50.1  ml       ---------  LA volume/bsa, ES, 1-p A4C                30.4  ml/m^2   ---------    Mitral valve                              Value          Reference  Mitral E-wave peak velocity               150   cm/s     ---------  Mitral A-wave peak velocity               41.9  cm/s     ---------  Mitral deceleration time                  222   ms       150 - 230  Mitral pressure half-time                 65    ms       ---------  Mitral peak gradient, D                   9     mm Hg     ---------  Mitral E/A ratio, peak                    3.6            ---------  Mitral valve area, PHT, DP                3.38  cm^2     ---------  Mitral valve area/bsa, PHT, DP            2.05  cm^2/m^2 ---------    Pulmonary arteries                        Value          Reference  PA pressure, S, DP                (H)     44    mm Hg    <=30    Tricuspid valve                           Value          Reference  Tricuspid regurg peak velocity            300   cm/s     ---------  Tricuspid peak RV-RA gradient             36    mm Hg    ---------  Systemic veins                            Value          Reference  Estimated CVP                             8     mm Hg    ---------    Pulmonic valve                            Value          Reference  Pulmonic regurg velocity, ED              92    cm/s     ---------  Legend: (L)  and  (H)  mark values outside specified reference range.  ------------------------------------------------------------------- Prepared and Electronically Authenticated by  Loralie Champagne, M.D. 2018-05-25T17:56:22  Physicians   Panel Physicians Referring Physician Case Authorizing Physician  Burnell Blanks, MD (Primary)    Procedures   Right/Left Heart Cath and Coronary/Graft Angiography  Conclusion     Ost Cx to Prox Cx lesion, 50 %stenosed.  Ost 2nd Mrg to 2nd Mrg lesion, 10 %stenosed.  SVG graft was visualized by angiography and is normal in caliber.  Mid RCA to Dist RCA lesion, 100 %stenosed.  SVG graft was visualized by angiography and is normal in caliber.  SVG graft was visualized by angiography and is normal in caliber.  Mid LAD lesion, 100 %stenosed.  1st Diag lesion, 50 %stenosed.  LIMA graft was visualized by non-selective angiography and is small.  Dist LAD lesion, 40 %stenosed.   1. Severe triple vessel CAD s/p 4V CABG with 3/4 patent bypass grafts 2. The mid LAD is chronically occluded. The vein graft to  the mid LAD is patent 3. The proximal Circumflex has moderate stenosis. The mid Circumflex stent is patent. The vein graft to the second OM is patent 4. The mid RCA is chronically occluded. The vein graft to the distal RCA is patent 5. The LIMA to the Diagonal is atretic. The Diagonal branch as moderate non-obstructive disease.  6. Severe aortic stenosis (mean gradient 41.5 mmHg, peak to peak gradient 41 mmHg, AVA 0.76cm2).   Recommendations: Will continue planning for TAVR. She will be referred to see the CT surgeons on our TAVR team and will need to have her pre op testing arranged    Indications   Severe aortic stenosis [I35.0 (ICD-10-CM)]  Severe aortic valve stenosis [I35.0 (ICD-10-CM)]  Coronary artery disease of native artery of native heart with stable angina pectoris (HCC) [G81.157 (ICD-10-CM)]  Procedural Details/Technique   Technical Details Indication: 83 yo female with history of severe AS, mild to moderate MR, CAD s/p CABG in 2006 with recent worsened dyspnea and chest pain.   Procedure: The risks, benefits, complications, treatment options, and expected outcomes were discussed with the patient. The patient and/or family concurred with the proposed plan, giving informed consent. The patient was brought to the cath lab after IV hydration was begun and oral premedication was given. The patient was further sedated with Versed and Fentanyl. The right groin was prepped and draped in the usual manner. Using the modified Seldinger access technique, a 5 French sheath was placed in the right femoral artery. A 7 French sheath was placed in the right femoral  vein. Right heart cath performed with a balloon tipped catheter. Standard diagnostic catheters were used to perform selective coronary angiography. I crossed the aortic valve with an AL-1 catheter and a straight wire.  There were no immediate complications. The patient was taken to the recovery area in stable condition.    Estimated  blood loss <50 mL.  During this procedure the patient was administered the following to achieve and maintain moderate conscious sedation: Versed 1 mg, Fentanyl 25 mcg, while the patient's heart rate, blood pressure, and oxygen saturation were continuously monitored. The period of conscious sedation was 32 minutes, of which I was present face-to-face 100% of this time.    Complications   Complications documented before study signed (10/27/2016 1:04 PM EDT)    RIGHT/LEFT HEART CATH AND CORONARY/GRAFT ANGIOGRAPHY   None Documented by Burnell Blanks, MD 10/27/2016 1:03 PM EDT  Time Range: Intra-procedure      Coronary Findings   Dominance: Right  Left Anterior Descending  Mid LAD lesion, 100% stenosed. The lesion is chronically occluded.  Dist LAD lesion, 40% stenosed.  First Diagonal Branch  Vessel is moderate in size.  1st Diag lesion, 50% stenosed.  First Septal Branch  Vessel is small in size.  Second Diagonal Branch  Vessel is small in size.  Second Septal Branch  Vessel is small in size.  Third Diagonal Branch  Vessel is small in size.  Third Septal Branch  Vessel is small in size.  Left Circumflex  Ost Cx to Prox Cx lesion, 50% stenosed.  First Obtuse Marginal Branch  Vessel is small in size.  Second Obtuse Marginal Branch  Vessel is moderate in size.  Ost 2nd Mrg to 2nd Mrg lesion, 10% stenosed. The lesion was previously treated using a stent (unknown type) over 2 years ago.  Third Obtuse Marginal Branch  Vessel is small in size.  Right Coronary Artery  Mid RCA to Dist RCA lesion, 100% stenosed. The lesion is chronically occluded.  Graft Angiography  saphenous Graft to 2nd Mrg  SVG graft was visualized by angiography and is normal in caliber.  saphenous Graft to RPDA  SVG graft was visualized by angiography and is normal in caliber.  saphenous Graft to Mid LAD  SVG graft was visualized by angiography and is normal in caliber.  Free LIMA Graft to 1st Diag   LIMA graft was visualized by non-selective angiography and is small.  Right Heart   Right Heart Pressures Elevated LV EDP consistent with volume overload.    Coronary Diagrams   Diagnostic Diagram       Implants     No implant documentation for this case.  PACS Images   Show images for Cardiac catheterization   Link to Procedure Log   Procedure Log    Hemo Data    Most Recent Value  Fick Cardiac Output 5.13 L/min  Fick Cardiac Output Index 3.15 (L/min)/BSA  Aortic Mean Gradient 41.5 mmHg  Aortic Peak Gradient 41 mmHg  Aortic Valve Area 0.76  Aortic Value Area Index 0.46 cm2/BSA  RA A Wave 7 mmHg  RA V Wave 7 mmHg  RA Mean 6 mmHg  RV Systolic Pressure 33 mmHg  RV Diastolic Pressure 2 mmHg  RV EDP 7 mmHg  PA Systolic Pressure 31 mmHg  PA Diastolic Pressure 13 mmHg  PA Mean 21 mmHg  PW A Wave 13 mmHg  PW V Wave 24 mmHg  PW Mean 14 mmHg  AO Systolic Pressure 616 mmHg  AO Diastolic  Pressure 55 mmHg  AO Mean 85 mmHg  LV Systolic Pressure 381 mmHg  LV Diastolic Pressure 9 mmHg  LV EDP 20 mmHg  Arterial Occlusion Pressure Extended Systolic Pressure 829 mmHg  Arterial Occlusion Pressure Extended Diastolic Pressure 64 mmHg  Arterial Occlusion Pressure Extended Mean Pressure 100 mmHg  Left Ventricular Apex Extended Systolic Pressure 937 mmHg  Left Ventricular Apex Extended Diastolic Pressure 10 mmHg  Left Ventricular Apex Extended EDP Pressure 20 mmHg  QP/QS 1  TPVR Index 6.66 HRUI  TSVR Index 26.99 HRUI  PVR SVR Ratio 0.09  TPVR/TSVR Ratio 0.25    CT CORONARY MORPH W/CTA COR W/SCORE W/CA W/CM &/OR WO/CM (Accession [id number]) (Order [id number])  Imaging  Date: 11/01/2016 Department: [facility] CT IMAGING Released By: Garth Bigness D Authorizing: Burnell Blanks, MD  Exam Information   Status Exam Begun  Exam Ended   Final [99] 11/01/2016 1:30 PM 11/01/2016 2:12 PM  PACS Images   Show images for CT CORONARY MORPH W/CTA COR  W/SCORE W/CA W/CM &/OR WO/CM  Addendum   ADDENDUM REPORT: 11/01/2016 17:29  ADDENDUM: The LAA is dilated. There is thrombus in the mid and apical LAA seen in multiple gated phases   Electronically Signed   By: Jenkins Rouge M.D.   On: 11/01/2016 17:29   Addended by Josue Hector, MD on 11/01/2016 5:32 PM    ADDENDUM REPORT: 11/01/2016 16:26  CLINICAL DATA:  Aortic stenosis  EXAM: Cardiac TAVR CT  TECHNIQUE: The patient was scanned on a Siemens 128 scanner. A 120 kV retrospective scan was triggered in the ascending thoracic aorta at 111 HU's. Gantry rotation speed was 300 msecs and collimation was .22mm. No beta blockade or nitro were given. The 3D data set was reconstructed in 10% intervals of the R-R cycle. Systolic and diastolic phases were analyzed on a dedicated work station using MPR, MIP and VRT modes. The patient received 80 cc of contrast.  FINDINGS: Aortic Valve: Tri -leaflet with severe calcification and restricted motion  Aorta: Normal root size. Moderate calcification of the STJ, root, arch and descending thoracic aorta  Mitral annular calcification  Three patent SVG;s noted as well as patent LIMA  Sinotubular Junction:  24 mm  Ascending Thoracic Aorta:  31 mm  Aortic Arch:  28 mm  Descending Thoracic Aorta:  24 mm  Sinus of Valsalva Measurements:  Non-coronary:  28 mm  Right -coronary:  25 mm  Left -coronary:  28 mm  Coronary Artery Height above Annulus:  Left Main:  13 mm above annulus  Right Coronary:  15 mm above annulus  Virtual Basal Annulus Measurements:  Maximum/Minimum Diameter:  17.  X 25.5 mm  Perimeter:  70 mm  Area:  354 mm2  Coronary Arteries: Sufficient height above annulus for deployment and patent SVG;s and patent LIMA  Optimum Fluoroscopic Angle for Delivery: LAO 9 degrees CAU 8 degrees  IMPRESSION: 1) Calcified tri leaflet aortic valve with annular area 354 mm2 suitable for a  23 mm Sapien 3 valve  2) Optimum angiographic angle for deployment LAO 9 CAU 8  3) Patent SVG;s and LIMA with native coronary arteries sufficient height above annulus for deployment  4) Significant calcification of the aorta and STJ  5) Mitral annular calcification  Jenkins Rouge   Electronically Signed   By: Jenkins Rouge M.D.   On: 11/01/2016 16:26       CT Angio Abd/Pel w/ and/or w/o (Accession [id number]) (Order [id number])  Imaging  Date: 11/01/2016  Department: [facility] CT IMAGING Released By: Sheffield Slider Authorizing: Burnell Blanks, MD  Exam Information   Status Exam Begun  Exam Ended   Final [99] 11/01/2016 1:32 PM 11/01/2016 2:13 PM  PACS Images   Show images for CT Angio Abd/Pel w/ and/or w/o  Study Result   CLINICAL DATA:  83 year old female with history of severe aortic stenosis. Preprocedural study prior to potential transcatheter aortic valve replacement (TAVR) procedure.  EXAM: CT ANGIOGRAPHY CHEST, ABDOMEN AND PELVIS  TECHNIQUE: Multidetector CT imaging through the chest, abdomen and pelvis was performed using the standard protocol during bolus administration of intravenous contrast. Multiplanar reconstructed images and MIPs were obtained and reviewed to evaluate the vascular anatomy.  CONTRAST:  70 mL of Isovue 370  COMPARISON:  CT the abdomen and pelvis 04/24/2006. Chest CT 07/25/2003.  FINDINGS: CTA CHEST FINDINGS  Cardiovascular: Heart size is mildly enlarged. There is no significant pericardial fluid, thickening or pericardial calcification. There is aortic atherosclerosis, as well as atherosclerosis of the great vessels of the mediastinum and the coronary arteries, including calcified atherosclerotic plaque in the left main, left anterior descending, left circumflex and right coronary arteries. Status post median sternotomy for CABG including LIMA to the LAD. Filling defect in the tip of  the left atrial appendage concerning for potential thrombus. Thickening calcification of the aortic valve.  Mediastinum/Lymph Nodes: No pathologically enlarged mediastinal or hilar lymph nodes. Esophagus is unremarkable in appearance. No axillary lymphadenopathy.  Lungs/Pleura: Nodular areas of pleuroparenchymal thickening are noted in the apices of the lungs bilaterally, most compatible with areas of chronic post infectious or inflammatory scarring. Small calcified granuloma in the right lower lobe. No other larger more suspicious appearing pulmonary nodules or masses are noted. Small left-sided Bochdalek's hernia. No acute consolidative airspace disease. No pleural effusions.  Musculoskeletal/Soft Tissues: Liver has a shrunken appearance and nodular contour, suggestive of underlying cirrhosis. No definite cystic or solid hepatic lesions. No intra or extrahepatic biliary ductal dilatation. Tiny calcified gallstones lie dependently in the gallbladder. No findings to suggest an acute cholecystitis at this time.  CTA ABDOMEN AND PELVIS FINDINGS  Hepatobiliary: No pancreatic mass. No pancreatic ductal dilatation. No pancreatic or peripancreatic fluid or inflammatory changes.  Pancreas: No pancreatic mass. No pancreatic ductal dilatation. No pancreatic or peripancreatic fluid or inflammatory changes.  Spleen: Unremarkable.  Adrenals/Urinary Tract: 4.6 cm exophytic simple cyst in the interpolar region of the right kidney. Several areas of mild cortical scarring the left kidney. subcentimeter low-attenuation lesion in the medial aspect of the interpolar region of the left kidney is too small to definitively characterize, but is statistically likely a tiny cyst. Bilateral adrenal glands are normal in appearance. No hydroureteronephrosis. Urinary bladder is normal in appearance.  Stomach/Bowel: Normal appearance of the stomach. No pathologic dilatation of small bowel or  colon. Numerous colonic diverticulae are noted, without surrounding inflammatory changes to suggest an acute diverticulitis at this time. The appendix is not confidently identified and may be surgically absent. Regardless, there are no inflammatory changes noted adjacent to the cecum to suggest the presence of an acute appendicitis at this time.  Vascular/Lymphatic: Aortic atherosclerosis. Vascular findings and measurements pertinent to potential TAVR procedure, as detailed below. Celiac axis, superior mesenteric artery and inferior mesenteric artery are all patent. 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 Single renal arteries bilaterally are widely patent. No lymphadenopathy noted in  the abdomen or pelvis.  Reproductive: Status post hysterectomy. Ovaries are not confidently identified may be surgically absent or atrophic.  Other: Right inguinal hernia containing only fat. Tiny umbilical hernia containing only fat. No significant volume of ascites. No pneumoperitoneum.  Musculoskeletal: There are no aggressive appearing lytic or blastic lesions noted in the visualized portions of the skeleton.  VASCULAR MEASUREMENTS PERTINENT TO TAVR:  AORTA:  Minimal Aortic Diameter -  10 x 9 mm  Severity of Aortic Calcification -  severe  RIGHT PELVIS:  Right Common Iliac Artery -  Minimal Diameter - 6.0 x 3.5 mm  Tortuosity - mild  Calcification - severe  Right External Iliac Artery -  Minimal Diameter - 5.4 x 5.2 mm  Tortuosity - mild  Calcification - none  Right Common Femoral Artery -  Minimal Diameter - 5.4 x 4.9 mm  Tortuosity - mild  Calcification - mild  LEFT PELVIS:  Left Common Iliac Artery -  Minimal Diameter - 2.1 x 6.7 mm  Tortuosity - mild  Calcification - severe  Left External Iliac Artery -  Minimal Diameter - 5.6 x  6.0 mm  Tortuosity - mild  Calcification - none  Left Common Femoral Artery -  Minimal Diameter - 6.0 x 6.2 mm  Tortuosity - mild  Calcification - mild  Review of the MIP images confirms the above findings.  IMPRESSION: 1. Vascular findings and measurements pertinent to potential TAVR procedure, as detailed above. This patient does not appear to have suitable pelvic arterial access on either side. 2. Severe thickening calcification of the aortic valve, compatible with the reported clinical history of severe aortic stenosis. 3. **An incidental finding of potential clinical significance has been found. Filling defect in the tip of the left atrial appendage, concerning for left atrial thrombus. Correlation with transesophageal echocardiography is recommended if clinically appropriate, as this places the patient at risk for systemic embolization if thrombus is present.** 4. 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 5. Aortic atherosclerosis, in addition to left main three-vessel coronary artery disease. Please note that although the presence of coronary artery calcium documents the presence of coronary artery disease, the severity of this disease and any potential stenosis cannot be assessed on this non-gated CT examination. Assessment for potential risk factor modification, dietary therapy or pharmacologic therapy may be warranted, if clinically indicated. Status post median sternotomy for CABG including LIMA to the LAD. 6. Colonic diverticulosis without evidence of acute diverticulitis at this time. 7. Additional incidental findings, as above. These results will be called to the ordering clinician or representative by the Radiologist Assistant, and communication documented in the PACS or zVision Dashboard.   Electronically Signed   By: Vinnie Langton M.D.   On: 11/01/2016 15:34     STS Risk Score:  Risk of Mortality: 5.21%  Morbidity or Mortality: 20.24%  Long Length of Stay: 9.042%  Short Length of Stay: 18.961%  Permanent Stroke: 2.727%  Prolonged Ventilation: 15.083%  DSW Infection: 0.229%  Renal Failure: 4.431%  Reoperation: 8.363%    Impression:  This 83 year old woman has stage D severe symptomatic aortic stenosis with NYHA class III symptoms of fatigue and shortness of breath with minimal exertion. She has also had exertional chest pressure and dizziness. This is consistent with chronic diastolic heart failure. Her symptoms have progressed over the past 6 months and she is very limited in her activity at this time. I have  personally reviewed her echo, cath, and CT studies. Her echo shows severe AS with a trileaflet aortic valve with severe leaflet calcification and a mean gradient of 54 mm Hg.  I agree that AVR is indicated in this patient who has remained independent. I don't think she is a candidate for open surgical AVR due to her age and frailty but TAVR is a reasonable alternative. Cardiac cath shows 3/4 patent bypass grafts and no significant areas of potential ischemia. Her cardiac CT shows anatomy favorable for a Sapien 3 valve. Her abdominal and pelvic CT shows severe aorto-iliac vascular disease but I think access may be adequate on the right for transfemoral insertion. The left side does not appear adequate for valve insertion but is adequate for diagnostic catheters. If right transfemoral access is not adequate then transapical insertion is probably the best option.   The patient and her family were counseled at length regarding treatment alternatives for management of severe symptomatic aortic stenosis. The risks and benefits of surgical intervention has been discussed in detail. Long-term prognosis with medical therapy was discussed. Alternative approaches such as conventional surgical aortic valve replacement, transcatheter aortic valve replacement,  and palliative medical therapy were compared and contrasted at length. This discussion was placed in the context of the patient's own specific clinical presentation and past medical history. All of their questions been addressed. The patient is eager to proceed with surgical management as soon as possible.  Following the decision to proceed with transcatheter aortic valve replacement, a discussion was held regarding what types of management strategies would be attempted intraoperatively in the event of life-threatening complications, including whether or not the patient would be considered a candidate for the use of cardiopulmonary bypass and/or conversion to open sternotomy for attempted surgical intervention.   The patient has been advised of a variety of complications that might develop including but not limited to risks of death, stroke, paravalvular leak, aortic dissection or other major vascular complications, aortic annulus rupture, device embolization, cardiac rupture or perforation, mitral regurgitation, acute myocardial infarction, arrhythmia, heart block or bradycardia requiring permanent pacemaker placement, congestive heart failure, respiratory failure, renal failure, pneumonia, infection, other late complications related to structural valve deterioration or migration, or other complications that might ultimately cause a temporary or permanent loss of functional independence or other long term morbidity. The patient provides full informed consent for the procedure as described and all questions were answered.    Plan:  Transfemoral TAVR. If transfemoral access is no adequate we will plan transapical insertion. She will stop her Eliquis prior to dental extractions which will be done late in the week prior to TAVR and then we will proceed with TAVR on the following Tuesday. This will be coordinated with Dr. Enrique Sack.  I spent 60 minutes performing this consultation and > 50% of this time was  spent face to face counseling and coordinating the care of this patient's severe aortic stenosis.  Gaye Pollack, MD 12/07/2016

## 2016-12-13 ENCOUNTER — Other Ambulatory Visit: Payer: Self-pay

## 2016-12-13 ENCOUNTER — Other Ambulatory Visit: Payer: Self-pay | Admitting: *Deleted

## 2016-12-13 DIAGNOSIS — I350 Nonrheumatic aortic (valve) stenosis: Secondary | ICD-10-CM

## 2016-12-14 ENCOUNTER — Other Ambulatory Visit (HOSPITAL_COMMUNITY): Payer: Self-pay | Admitting: Dentistry

## 2016-12-23 NOTE — Pre-Procedure Instructions (Signed)
[name]  12/23/2016      [facility] [address], [place] [address] Phone: [phone] Fax: [phone]  [facility] [address], [place] [address] [facility] [place] [address] Phone: [phone] Fax: [phone]  [facility] [place], [place] - [address] [place] [address] Phone: [phone] Fax: [phone]   HEART SURGERY   Your procedure is scheduled on August 14  Report to [place] at [place].M.  Call this number if you have problems the morning of surgery:  [phone]   Remember:  Do not eat food or drink liquids after midnight.   Take these medicines the morning of surgery with A SIP OF WATER acetaminophen (TYLENOL),  diltiazem (CARDIZEM CD), FLOVENT, fluticasone (FLONASE) , isosorbide dinitrate (ISORDIL), loratadine (CLARITIN) ,  nitroGLYCERIN (NITROSTAT)  If needed, pantoprazole (PROTONIX), VENTOLIN   7 days prior to surgery STOP taking any Aspirin, Aleve, Naproxen, Ibuprofen, Motrin, Advil, Goody's, BC's, all herbal medications, fish oil, and all vitamins  FOLLOW PHYSICIANS INSTRUCTIONS ABOUT ELIQUIS    Do not wear jewelry, make-up or nail polish.  Do not wear lotions, powders, or perfumes, or deoderant.  Do not shave 48 hours prior to surgery.  Men may shave face and neck.  Do not bring valuables to the hospital.  [facility] is not responsible for any belongings or valuables.  Contacts, dentures or bridgework may not be worn into surgery.  Leave your suitcase in the car.  After surgery it may be brought to your room.  For patients admitted to the hospital, discharge time will be determined by your treatment team.  Patients discharged the day of surgery will not be allowed to drive home.    Special instructions:   [facility]- Preparing For Surgery  Before surgery, you can play an important role. Because skin is not sterile, your skin needs to be as free of germs as possible. You can reduce the number of germs on your skin by washing with CHG (chlorahexidine gluconate) Soap before surgery.  CHG is an antiseptic cleaner which kills germs and bonds with the skin to continue killing germs even after washing.  Please do not use if you have an allergy to CHG or antibacterial soaps. If your skin becomes reddened/irritated stop using the CHG.  Do not shave (including legs and underarms) for at least 48 hours prior to first CHG shower. It is OK to shave your face.  Please follow these instructions carefully.   1. Shower the NIGHT BEFORE SURGERY and the MORNING OF SURGERY with CHG.   2. If you chose to wash your hair, wash your hair first as usual with your normal shampoo.  3. After you shampoo, rinse your hair and body thoroughly to remove the shampoo.  4. Use CHG as you would any other liquid soap. You can apply CHG directly to the skin and wash gently with a scrungie or a clean washcloth.   5. Apply the CHG Soap to your body ONLY FROM THE NECK DOWN.  Do not use on open wounds or open sores. Avoid contact with your eyes, ears, mouth and genitals (private parts). Wash genitals (private parts) with your normal soap.  6. Wash thoroughly, paying special attention to the area where your surgery will be  performed.  7. Thoroughly rinse your body with warm water from the neck down.  8. DO NOT shower/wash with your normal soap after using and rinsing off the CHG Soap.  9. Pat yourself dry with a CLEAN TOWEL.   10. Wear CLEAN PAJAMAS   11. Place CLEAN SHEETS on your bed the night of your first shower and DO NOT SLEEP WITH PETS.    Day of Surgery: Do not apply any deodorants/lotions. Please wear clean clothes to the hospital/surgery center.      Please read over the following fact sheets that you were  given.

## 2016-12-23 NOTE — Pre-Procedure Instructions (Signed)
[name]  12/23/2016      [facility] [address], [place] [address] Phone: [phone] Fax: [phone]  [facility] [address], [place] [address] [place] [address] [facility] [place] [address] Phone: [phone] Fax: [phone]  [facility] [place], [place] - [address] [place] [address] Phone: [phone] Fax: [phone]   DENTAL SURGERY   Your procedure is scheduled on August 8  Report to [place] at [place].M.  Call this number if you have problems the morning of surgery:  [phone]   Remember:  Do not eat food or drink liquids after midnight.   Take these medicines the morning of surgery with A SIP OF WATER acetaminophen (TYLENOL),  diltiazem (CARDIZEM CD), FLOVENT, fluticasone (FLONASE) , isosorbide dinitrate (ISORDIL), loratadine (CLARITIN) , metoprolol tartrate (LOPRESSOR), nitroGLYCERIN (NITROSTAT)  If needed, pantoprazole (PROTONIX), VENTOLIN   7 days prior to surgery STOP taking any Aspirin, Aleve, Naproxen, Ibuprofen, Motrin, Advil, Goody's, BC's, all herbal medications, fish oil, and all vitamins  FOLLOW PHYSICIANS INSTRUCTIONS ABOUT ELIQUIS    Do not wear jewelry, make-up or nail polish.  Do not wear lotions, powders, or perfumes, or deoderant.  Do not shave 48 hours prior to surgery.  Men may shave face and neck.  Do not bring valuables to the hospital.  [facility] is not responsible for any belongings or valuables.  Contacts, dentures or bridgework may not be worn into surgery.  Leave your suitcase in the car.  After surgery it may be brought to your room.  For patients admitted to the hospital, discharge time will be determined by your treatment team.  Patients discharged the day of surgery will not be allowed to drive home.     Special instructions:   [facility]- Preparing For Surgery  Before surgery, you can play an important role. Because skin is not sterile, your skin needs to be as free of germs as possible. You can reduce the number of germs on your skin by washing with CHG (chlorahexidine gluconate) Soap before surgery.  CHG is an antiseptic cleaner which kills germs and bonds with the skin to continue killing germs even after washing.  Please do not use if you have an allergy to CHG or antibacterial soaps. If your skin becomes reddened/irritated stop using the CHG.  Do not shave (including legs and underarms) for at least 48 hours prior to first CHG shower. It is OK to shave your face.  Please follow these instructions carefully.   1. Shower the NIGHT BEFORE SURGERY and the MORNING OF SURGERY with CHG.   2. If you chose to wash your hair, wash your hair first as usual with your normal shampoo.  3. After you shampoo, rinse your hair and body thoroughly to remove the shampoo.  4. Use CHG as you would any other liquid soap. You can apply CHG directly to the skin and wash gently with a scrungie or a clean washcloth.   5. Apply the CHG Soap to your body ONLY FROM THE NECK DOWN.  Do not use on open wounds or open sores. Avoid contact with your eyes, ears, mouth and genitals (private parts). Wash genitals (private parts) with your normal soap.  6. Wash thoroughly, paying special attention to the area where your surgery  will be performed.  7. Thoroughly rinse your body with warm water from the neck down.  8. DO NOT shower/wash with your normal soap after using and rinsing off the CHG Soap.  9. Pat yourself dry with a CLEAN TOWEL.   10. Wear CLEAN PAJAMAS   11. Place CLEAN SHEETS on your bed the night of your first shower and DO NOT SLEEP WITH PETS.    Day of Surgery: Do not apply any deodorants/lotions. Please wear clean clothes to the hospital/surgery center.      Please read over the following  fact sheets that you were given.

## 2016-12-26 ENCOUNTER — Ambulatory Visit (HOSPITAL_COMMUNITY)
Admission: RE | Admit: 2016-12-26 | Discharge: 2016-12-26 | Disposition: A | Discharge status: Home / Self Care | Payer: Medicare Other | Source: Ambulatory Visit | Attending: Cardiovascular Disease | Admitting: Cardiovascular Disease

## 2016-12-26 ENCOUNTER — Encounter (HOSPITAL_COMMUNITY)
Admission: RE | Admit: 2016-12-26 | Discharge: 2016-12-26 | Disposition: A | Discharge status: Home / Self Care | Payer: Medicare Other | Source: Ambulatory Visit | Attending: Dentistry | Admitting: Dentistry

## 2016-12-26 ENCOUNTER — Encounter (HOSPITAL_COMMUNITY): Payer: Self-pay

## 2016-12-26 DIAGNOSIS — I350 Nonrheumatic aortic (valve) stenosis: Secondary | ICD-10-CM | POA: Diagnosis not present

## 2016-12-26 DIAGNOSIS — I10 Essential (primary) hypertension: Secondary | ICD-10-CM | POA: Insufficient documentation

## 2016-12-26 DIAGNOSIS — Z01812 Encounter for preprocedural laboratory examination: Secondary | ICD-10-CM | POA: Diagnosis not present

## 2016-12-26 DIAGNOSIS — Z01810 Encounter for preprocedural cardiovascular examination: Secondary | ICD-10-CM | POA: Diagnosis not present

## 2016-12-26 DIAGNOSIS — Z90710 Acquired absence of both cervix and uterus: Secondary | ICD-10-CM | POA: Diagnosis not present

## 2016-12-26 DIAGNOSIS — I208 Other forms of angina pectoris: Secondary | ICD-10-CM | POA: Insufficient documentation

## 2016-12-26 DIAGNOSIS — J45909 Unspecified asthma, uncomplicated: Secondary | ICD-10-CM | POA: Insufficient documentation

## 2016-12-26 DIAGNOSIS — I517 Cardiomegaly: Secondary | ICD-10-CM | POA: Insufficient documentation

## 2016-12-26 DIAGNOSIS — Z951 Presence of aortocoronary bypass graft: Secondary | ICD-10-CM | POA: Insufficient documentation

## 2016-12-26 DIAGNOSIS — Z8673 Personal history of transient ischemic attack (TIA), and cerebral infarction without residual deficits: Secondary | ICD-10-CM | POA: Diagnosis not present

## 2016-12-26 DIAGNOSIS — K219 Gastro-esophageal reflux disease without esophagitis: Secondary | ICD-10-CM | POA: Insufficient documentation

## 2016-12-26 DIAGNOSIS — Z87891 Personal history of nicotine dependence: Secondary | ICD-10-CM | POA: Insufficient documentation

## 2016-12-26 DIAGNOSIS — F419 Anxiety disorder, unspecified: Secondary | ICD-10-CM | POA: Diagnosis not present

## 2016-12-26 DIAGNOSIS — Z9889 Other specified postprocedural states: Secondary | ICD-10-CM | POA: Diagnosis not present

## 2016-12-26 DIAGNOSIS — I6529 Occlusion and stenosis of unspecified carotid artery: Secondary | ICD-10-CM | POA: Diagnosis not present

## 2016-12-26 DIAGNOSIS — M797 Fibromyalgia: Secondary | ICD-10-CM | POA: Insufficient documentation

## 2016-12-26 DIAGNOSIS — I252 Old myocardial infarction: Secondary | ICD-10-CM | POA: Insufficient documentation

## 2016-12-26 DIAGNOSIS — I2510 Atherosclerotic heart disease of native coronary artery without angina pectoris: Secondary | ICD-10-CM | POA: Diagnosis not present

## 2016-12-26 DIAGNOSIS — R0602 Shortness of breath: Secondary | ICD-10-CM | POA: Diagnosis not present

## 2016-12-26 HISTORY — DX: Anxiety disorder, unspecified: F41.9

## 2016-12-26 LAB — COMPREHENSIVE METABOLIC PANEL
ALT: 68 U/L — ABNORMAL HIGH (ref 14–54)
ANION GAP: 11 (ref 5–15)
AST: 35 U/L (ref 15–41)
Albumin: 4.2 g/dL (ref 3.5–5.0)
Alkaline Phosphatase: 78 U/L (ref 38–126)
BUN: 11 mg/dL (ref 6–20)
CHLORIDE: 108 mmol/L (ref 101–111)
CO2: 22 mmol/L (ref 22–32)
Calcium: 9.6 mg/dL (ref 8.9–10.3)
Creatinine, Ser: 0.43 mg/dL — ABNORMAL LOW (ref 0.44–1.00)
GFR calc Af Amer: >60 mL/min (ref >60)
Glucose, Bld: 111 mg/dL — ABNORMAL HIGH (ref 65–99)
POTASSIUM: 3.9 mmol/L (ref 3.5–5.1)
Sodium: 141 mmol/L (ref 135–145)
TOTAL PROTEIN: 7 g/dL (ref 6.5–8.1)
Total Bilirubin: 0.7 mg/dL (ref 0.3–1.2)

## 2016-12-26 LAB — CBC
HCT: 35 % — ABNORMAL LOW (ref 36.0–46.0)
Hemoglobin: 11.3 g/dL — ABNORMAL LOW (ref 12.0–15.0)
MCH: 30.3 pg (ref 26.0–34.0)
MCHC: 32.3 g/dL (ref 30.0–36.0)
MCV: 93.8 fL (ref 78.0–100.0)
PLATELETS: 162 10*3/uL (ref 150–400)
RBC: 3.73 MIL/uL — ABNORMAL LOW (ref 3.87–5.11)
RDW: 15 % (ref 11.5–15.5)
WBC: 6.4 10*3/uL (ref 4.0–10.5)

## 2016-12-26 LAB — URINALYSIS, ROUTINE W REFLEX MICROSCOPIC
BILIRUBIN URINE: NEGATIVE
GLUCOSE, UA: NEGATIVE mg/dL
HGB URINE DIPSTICK: NEGATIVE
Ketones, ur: NEGATIVE mg/dL
NITRITE: NEGATIVE
PH: 5 (ref 5.0–8.0)
Protein, ur: NEGATIVE mg/dL
SPECIFIC GRAVITY, URINE: 1.008 (ref 1.005–1.030)

## 2016-12-26 LAB — BLOOD GAS, ARTERIAL
ACID-BASE EXCESS: 1.1 mmol/L (ref 0.0–2.0)
BICARBONATE: 24.3 mmol/L (ref 20.0–28.0)
DRAWN BY: 4705911
FIO2: 21
O2 SAT: 99 %
PCO2 ART: 33 mmHg (ref 32.0–48.0)
Patient temperature: 98.6
pH, Arterial: 7.48 — ABNORMAL HIGH (ref 7.350–7.450)
pO2, Arterial: 146 mmHg — ABNORMAL HIGH (ref 83.0–108.0)

## 2016-12-26 LAB — APTT: aPTT: 31 seconds (ref 24–36)

## 2016-12-26 LAB — SURGICAL PCR SCREEN
MRSA, PCR: NEGATIVE
Staphylococcus aureus: NEGATIVE

## 2016-12-26 LAB — PROTIME-INR
INR: 1.07
PROTHROMBIN TIME: 13.9 s (ref 11.4–15.2)

## 2016-12-26 MED ORDER — CHLORHEXIDINE GLUCONATE 4 % EX LIQD: 60.0000 mL | Freq: Once | CUTANEOUS | Status: DC

## 2016-12-26 MED ORDER — CHLORHEXIDINE GLUCONATE 0.12 % MT SOLN: 15.0000 mL | Freq: Once | OROMUCOSAL | Status: DC

## 2016-12-26 MED ORDER — SODIUM CHLORIDE 0.9 % IV SOLN: INTRAVENOUS | Status: DC

## 2016-12-26 NOTE — Progress Notes (Addendum)
Anesthesia Chart Review: Patient is a 83 year old female scheduled for multiple teeth extraction with alveoloplasty on 12/28/16 by Dr. Enrique Sack. (Notes indicate extraction of all remaining teeth except impacted tooth # 6 and 11). Patient has severe AS and needs dental extractions prior to TAVR scheduled for 01/03/17 with Dr. Angelena Form and CT surgery.   History includes former smoker (quit '03), CAD (MI s/p CX stent 07/01/01, MAZE/CABG 02/04/05 LIMA-DIAG, SVG-LAD, SVG-OM, SVG-PDA 02/04/05), chronic stable angina, ASA allergy, carotid artery stenosis s/p right carotid endarterectomy 3/47/42, PAF, diastolic dysfunction, murmur (severe AS, mild MR, tirival TR/PR 09/2016), left atrial thrombus (Rx: Eliquis for one month prior to TAVR) 10/2016, HTN, HLD, CVA, asthma, GERD, hiatal hernia, fibromyalgia, anxiety, kyphoscoliosis, hysterectomy, tonsillectomy.  PCP is Dr. Jill Alexanders. Cardiologist is Dr. Lauree Chandler.  Meds include diltiazem, Flovent HFA, Flonase, Mucinex, Isordil, loratadine, Lopressor, Nitro, Protonix, Crestor, tramadol, valsartan, Ventolin HFA, Eliquis (hold 3 days prior to surgery).   BP (!) 137/57   Pulse 65   Temp 36.7 C   Resp 18   Ht 5\' 5"  (1.651 m)   Wt 123 lb 11.2 oz (56.1 kg)   SpO2 97%   BMI 20.58 kg/m   EKG 12/26/16: NSR with first degree AV block, minimal voltage for LVH, may be normal variant.  Cardiac cath 10/27/16:  Ost Cx to Prox Cx lesion, 50 %stenosed.  Ost 2nd Mrg to 2nd Mrg lesion, 10 %stenosed.  SVG graft was visualized by angiography and is normal in caliber.  Mid RCA to Dist RCA lesion, 100 %stenosed.  SVG graft was visualized by angiography and is normal in caliber.  SVG graft was visualized by angiography and is normal in caliber.  Mid LAD lesion, 100 %stenosed.  1st Diag lesion, 50 %stenosed.  LIMA graft was visualized by non-selective angiography and is small.  Dist LAD lesion, 40 %stenosed. 1. Severe triple vessel CAD s/p 4V CABG with 3/4  patent bypass grafts 2. The mid LAD is chronically occluded. The vein graft to the mid LAD is patent 3. The proximal Circumflex has moderate stenosis. The mid Circumflex stent is patent. The vein graft to the second OM is patent 4. The mid RCA is chronically occluded. The vein graft to the distal RCA is patent 5. The LIMA to the Diagonal is atretic. The Diagonal branch as moderate non-obstructive disease.  6. Severe aortic stenosis (mean gradient 41.5 mmHg, peak to peak gradient 41 mmHg, AVA 0.76cm2).  Recommendations: Will continue planning for TAVR. She will be referred to see the CT surgeons on our TAVR team and will need to have her pre op testing arranged   Echo 10/14/16: Study Conclusions - Left ventricle: The cavity size was normal. Wall thickness was   increased in a pattern of mild LVH. Systolic function was normal.   The estimated ejection fraction was in the range of 60% to 65%.   Wall motion was normal; there were no regional wall motion   abnormalities. Features are consistent with a pseudonormal left   ventricular filling pattern, with concomitant abnormal relaxation   and increased filling pressure (grade 2 diastolic dysfunction). - Aortic valve: Trileaflet; severely calcified leaflets. There was   severe stenosis. Mean gradient (S): 54 mm Hg. Peak gradient (S):   101 mm Hg. Valve area (VTI): 0.45 cm^2. - Mitral valve: Mildly to moderately calcified annulus. Moderately   calcified leaflets . There was mild regurgitation. - Left atrium: The atrium was mildly dilated. - Right ventricle: The cavity size was normal.  Systolic function   was normal. - Tricuspid valve: Peak RV-RA gradient (S): 36 mm Hg. - Pulmonary arteries: PA peak pressure: 44 mm Hg (S). - Systemic veins: IVC measured 1.7 cm with < 50% respirophasic   variation, suggesting RA pressure 8 mmHg. Impressions: - Normal LV size with mild LV hypertrophy. EF 60-65%. Moderate   diastolic dysfunction. Severe aortic  stenosis. Normal RV size and   systolic function. Mild MR. Mild pulmonary hypertension.  Carotid U/S 11/01/16: Summary: Bilateral - 40% to 59% ICA stenosis. Vertebral artey flow is antegrade.  CT cardiac 11/01/16: IMPRESSION: 1) Calcified tri leaflet aortic valve with annular area 354 mm2 suitable for a 23 mm Sapien 3 valve 2) Optimum angiographic angle for deployment LAO 9 CAU 8 3) Patent SVG;s and LIMA with native coronary arteries sufficient height above annulus for deployment 4) Significant calcification of the aorta and STJ 5) Mitral annular calcification 6) The LAA is dilated. There is thrombus in the mid and apical LAA seen in multiple gated phases  CTA chest/abd/pelvis 11/01/16: IMPRESSION: 1. Vascular findings and measurements pertinent to potential TAVR procedure, as detailed above. This patient does not appear to have suitable pelvic arterial access on either side. 2. Severe thickening calcification of the aortic valve, compatible with the reported clinical history of severe aortic stenosis. 3. **An incidental finding of potential clinical significance has been found. Filling defect in the tip of the left atrial appendage, concerning for left atrial thrombus. Correlation with transesophageal echocardiography is recommended if clinically appropriate, as this places the patient at risk for systemic embolization if thrombus is present.** 4. 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 5. Aortic atherosclerosis, in addition to left main three-vessel coronary artery disease.  6. Colonic diverticulosis without evidence of acute diverticulitis at this time. 7. Additional incidental findings, as above.  CXR 12/26/16: FINDINGS: The heart is normal in size. Postop changes from sternotomy and CABG. Lungs are hyperaerated. Calcified granuloma at the right lung base. Mild patchy densities at the left  base are worrisome for airspace disease. No pneumothorax. No pleural effusion. Dextroscoliosis in the midthoracic spine.  IMPRESSION: Patchy root left basilar airspace disease.  PFTs 11/01/16: FVC 2.02 (81%), FEV1 0.91 (49%), DLCO unc 10.85 (42%).   Preoperative labs noted. Cr 043. AST 35, ALT 68. WBC 6.4. H/H 11.3/35.0.  PLT, PT/PTT WNL. Glucose 111, A1c pending. UA small amount leukocytes, negative nitrites. RA ABG showed pH 7.48, pO2 146, pCO2 33.0, HCO3 24.3, O2 sat 99%.  I was not consulted to evaluate patient at her PAT RN evaluation, but nursing staff did forward chart for review. I will forward CXR results to TCTS.   [facility] Short Stay Center/Anesthesiology Phone [phone] 12/26/2016 6:12 PM  Addendum: I called and spoke with patient. I spoke with her for over 10 minutes. She is a very pleasant and talkative female. There was no conversational dyspnea noted. She reports she has chronic SOB, even at rest, but without acute changes. She denied acute or recent URI/cold, fever, sore throat. She did have emesis X 2 a couple of weeks ago, but this has since resolved. She does have "chronic bronchitis" for at least 14 years, so does have an intermittent productive cough. She uses her albuterol inhaler whenever she is having difficulty with secretions, which is helpful. She reports history of kyphosis. Last had PNA ~ 3 years ago. Eliquis on hold since 12/24/16. One of her  daughters lives with patient. Subjectively, she does not report any symptoms of pneumonia. Encouraged deep breathing exercises. She will be evaluated on arrival prior to dental surgery. If no acute cardiopulmonary issues or worrisome lung sounds then I would anticipate that she could proceed as planned. TCTS RN Ryan to review CXR results with Dr. Angelena Form and Dr. Cyndia Bent to clarify if any additional recommendations prior to TAVR.   [facility] Short Stay Center/Anesthesiology Phone [phone] 12/27/2016 11:21 AM

## 2016-12-27 LAB — HEMOGLOBIN A1C
Hgb A1c MFr Bld: 5.7 % — ABNORMAL HIGH (ref 4.8–5.6)
Mean Plasma Glucose: 117 mg/dL

## 2016-12-28 ENCOUNTER — Encounter (HOSPITAL_COMMUNITY)
Admission: RE | Disposition: A | Discharge status: Home / Self Care | Payer: Self-pay | Source: Ambulatory Visit | Attending: Cardiovascular Disease

## 2016-12-28 ENCOUNTER — Ambulatory Visit (HOSPITAL_COMMUNITY): Payer: Medicare Other | Admitting: Vascular Surgery

## 2016-12-28 ENCOUNTER — Ambulatory Visit (HOSPITAL_COMMUNITY): Payer: Medicare Other | Admitting: Certified Registered Nurse Anesthetist

## 2016-12-28 ENCOUNTER — Observation Stay (HOSPITAL_COMMUNITY)
Admission: RE | Admit: 2016-12-28 | Discharge: 2016-12-29 | Disposition: A | Discharge status: Home / Self Care | Payer: Medicare Other | Source: Ambulatory Visit | Attending: Cardiovascular Disease | Admitting: Cardiovascular Disease

## 2016-12-28 ENCOUNTER — Encounter (HOSPITAL_COMMUNITY): Payer: Self-pay | Admitting: *Deleted

## 2016-12-28 DIAGNOSIS — Z7951 Long term (current) use of inhaled steroids: Secondary | ICD-10-CM | POA: Insufficient documentation

## 2016-12-28 DIAGNOSIS — I2510 Atherosclerotic heart disease of native coronary artery without angina pectoris: Secondary | ICD-10-CM | POA: Diagnosis not present

## 2016-12-28 DIAGNOSIS — M278 Other specified diseases of jaws: Secondary | ICD-10-CM | POA: Diagnosis not present

## 2016-12-28 DIAGNOSIS — M797 Fibromyalgia: Secondary | ICD-10-CM | POA: Diagnosis not present

## 2016-12-28 DIAGNOSIS — K0530 Chronic periodontitis, unspecified: Secondary | ICD-10-CM

## 2016-12-28 DIAGNOSIS — Z79899 Other long term (current) drug therapy: Secondary | ICD-10-CM | POA: Diagnosis not present

## 2016-12-28 DIAGNOSIS — Z87891 Personal history of nicotine dependence: Secondary | ICD-10-CM | POA: Diagnosis not present

## 2016-12-28 DIAGNOSIS — Z85828 Personal history of other malignant neoplasm of skin: Secondary | ICD-10-CM | POA: Insufficient documentation

## 2016-12-28 DIAGNOSIS — K219 Gastro-esophageal reflux disease without esophagitis: Secondary | ICD-10-CM | POA: Diagnosis not present

## 2016-12-28 DIAGNOSIS — K083 Retained dental root: Secondary | ICD-10-CM | POA: Diagnosis not present

## 2016-12-28 DIAGNOSIS — I252 Old myocardial infarction: Secondary | ICD-10-CM | POA: Insufficient documentation

## 2016-12-28 DIAGNOSIS — I350 Nonrheumatic aortic (valve) stenosis: Secondary | ICD-10-CM | POA: Diagnosis not present

## 2016-12-28 DIAGNOSIS — K068 Other specified disorders of gingiva and edentulous alveolar ridge: Secondary | ICD-10-CM | POA: Diagnosis present

## 2016-12-28 DIAGNOSIS — K91840 Postprocedural hemorrhage and hematoma of a digestive system organ or structure following a digestive system procedure: Secondary | ICD-10-CM | POA: Diagnosis not present

## 2016-12-28 DIAGNOSIS — Z955 Presence of coronary angioplasty implant and graft: Secondary | ICD-10-CM | POA: Insufficient documentation

## 2016-12-28 DIAGNOSIS — Z951 Presence of aortocoronary bypass graft: Secondary | ICD-10-CM | POA: Diagnosis not present

## 2016-12-28 DIAGNOSIS — K0889 Other specified disorders of teeth and supporting structures: Secondary | ICD-10-CM | POA: Diagnosis not present

## 2016-12-28 DIAGNOSIS — K045 Chronic apical periodontitis: Principal | ICD-10-CM

## 2016-12-28 DIAGNOSIS — I739 Peripheral vascular disease, unspecified: Secondary | ICD-10-CM | POA: Insufficient documentation

## 2016-12-28 DIAGNOSIS — J45909 Unspecified asthma, uncomplicated: Secondary | ICD-10-CM | POA: Diagnosis not present

## 2016-12-28 DIAGNOSIS — I4891 Unspecified atrial fibrillation: Secondary | ICD-10-CM | POA: Diagnosis not present

## 2016-12-28 DIAGNOSIS — K046 Periapical abscess with sinus: Secondary | ICD-10-CM

## 2016-12-28 DIAGNOSIS — I480 Paroxysmal atrial fibrillation: Secondary | ICD-10-CM | POA: Diagnosis not present

## 2016-12-28 DIAGNOSIS — Y838 Other surgical procedures as the cause of abnormal reaction of the patient, or of later complication, without mention of misadventure at the time of the procedure: Secondary | ICD-10-CM | POA: Insufficient documentation

## 2016-12-28 DIAGNOSIS — I10 Essential (primary) hypertension: Secondary | ICD-10-CM | POA: Diagnosis not present

## 2016-12-28 DIAGNOSIS — K029 Dental caries, unspecified: Secondary | ICD-10-CM | POA: Diagnosis not present

## 2016-12-28 DIAGNOSIS — Z8673 Personal history of transient ischemic attack (TIA), and cerebral infarction without residual deficits: Secondary | ICD-10-CM | POA: Diagnosis not present

## 2016-12-28 DIAGNOSIS — E785 Hyperlipidemia, unspecified: Secondary | ICD-10-CM | POA: Diagnosis not present

## 2016-12-28 DIAGNOSIS — Z01818 Encounter for other preprocedural examination: Secondary | ICD-10-CM

## 2016-12-28 HISTORY — PX: MULTIPLE EXTRACTIONS WITH ALVEOLOPLASTY: SHX5342

## 2016-12-28 SURGERY — MULTIPLE EXTRACTION WITH ALVEOLOPLASTY
Anesthesia: General | Site: Mouth

## 2016-12-28 MED ORDER — OXYMETAZOLINE HCL 0.05 % NA SOLN
NASAL | Status: AC
  Filled 2016-12-28: qty 15

## 2016-12-28 MED ORDER — ONDANSETRON HCL 4 MG/2ML IJ SOLN
INTRAMUSCULAR | Status: DC | PRN
  Administered 2016-12-28: 4 mg via INTRAVENOUS

## 2016-12-28 MED ORDER — HYDROMORPHONE HCL 1 MG/ML IJ SOLN: 0.2500 mg | INTRAMUSCULAR | Status: DC | PRN

## 2016-12-28 MED ORDER — ROCURONIUM BROMIDE 100 MG/10ML IV SOLN
INTRAVENOUS | Status: DC | PRN
  Administered 2016-12-28: 50 mg via INTRAVENOUS

## 2016-12-28 MED ORDER — PANTOPRAZOLE SODIUM 40 MG PO TBEC: 40.0000 mg | DELAYED_RELEASE_TABLET | Freq: Two times a day (BID) | ORAL | Status: DC

## 2016-12-28 MED ORDER — SUGAMMADEX SODIUM 200 MG/2ML IV SOLN
INTRAVENOUS | Status: DC | PRN
  Administered 2016-12-28: 112 mg via INTRAVENOUS

## 2016-12-28 MED ORDER — LIDOCAINE 2% (20 MG/ML) 5 ML SYRINGE
INTRAMUSCULAR | Status: AC
  Filled 2016-12-28: qty 5

## 2016-12-28 MED ORDER — ETOMIDATE 2 MG/ML IV SOLN
INTRAVENOUS | Status: DC | PRN
  Administered 2016-12-28: 10 mg via INTRAVENOUS
  Administered 2016-12-28: 2 mg via INTRAVENOUS

## 2016-12-28 MED ORDER — LIDOCAINE-EPINEPHRINE 2 %-1:100000 IJ SOLN
INTRAMUSCULAR | Status: DC | PRN
  Administered 2016-12-28: 10.2 mL via INTRADERMAL

## 2016-12-28 MED ORDER — CEFAZOLIN SODIUM-DEXTROSE 1-4 GM/50ML-% IV SOLN
1.0000 g | Freq: Three times a day (TID) | INTRAVENOUS | Status: DC
  Administered 2016-12-28 – 2016-12-29 (×3): 1 g via INTRAVENOUS
  Filled 2016-12-28 (×6): qty 50

## 2016-12-28 MED ORDER — ACETAMINOPHEN 325 MG PO TABS
650.0000 mg | ORAL_TABLET | Freq: Three times a day (TID) | ORAL | Status: DC
  Administered 2016-12-28 – 2016-12-29 (×2): 650 mg via ORAL
  Filled 2016-12-28 (×2): qty 2

## 2016-12-28 MED ORDER — CEFAZOLIN SODIUM-DEXTROSE 2-4 GM/100ML-% IV SOLN
2.0000 g | Freq: Once | INTRAVENOUS | Status: AC
  Administered 2016-12-28: 2 g via INTRAVENOUS
  Filled 2016-12-28: qty 100

## 2016-12-28 MED ORDER — LORATADINE 10 MG PO TABS: 10.0000 mg | ORAL_TABLET | Freq: Every day | ORAL | Status: DC

## 2016-12-28 MED ORDER — WHITE PETROLATUM GEL
Status: AC
  Administered 2016-12-28: 23:00:00
  Filled 2016-12-28: qty 1

## 2016-12-28 MED ORDER — 0.9 % SODIUM CHLORIDE (POUR BTL) OPTIME
TOPICAL | Status: DC | PRN
  Administered 2016-12-28: 1000 mL

## 2016-12-28 MED ORDER — ETOMIDATE 2 MG/ML IV SOLN
INTRAVENOUS | Status: AC
  Filled 2016-12-28: qty 20

## 2016-12-28 MED ORDER — GUAIFENESIN ER 600 MG PO TB12
600.0000 mg | ORAL_TABLET | Freq: Two times a day (BID) | ORAL | Status: DC
  Administered 2016-12-28: 600 mg via ORAL
  Filled 2016-12-28: qty 1

## 2016-12-28 MED ORDER — LIDOCAINE-EPINEPHRINE 2 %-1:100000 IJ SOLN
INTRAMUSCULAR | Status: AC
  Filled 2016-12-28: qty 10.2

## 2016-12-28 MED ORDER — SODIUM CHLORIDE 0.9% FLUSH: 3.0000 mL | Freq: Two times a day (BID) | INTRAVENOUS | Status: DC

## 2016-12-28 MED ORDER — ROCURONIUM BROMIDE 10 MG/ML (PF) SYRINGE
PREFILLED_SYRINGE | INTRAVENOUS | Status: AC
  Filled 2016-12-28: qty 5

## 2016-12-28 MED ORDER — ARTIFICIAL TEARS OPHTHALMIC OINT
TOPICAL_OINTMENT | OPHTHALMIC | Status: DC | PRN
  Administered 2016-12-28: 1 via OPHTHALMIC

## 2016-12-28 MED ORDER — ROSUVASTATIN CALCIUM 40 MG PO TABS
40.0000 mg | ORAL_TABLET | Freq: Every evening | ORAL | Status: DC
  Administered 2016-12-28: 40 mg via ORAL
  Filled 2016-12-28: qty 1

## 2016-12-28 MED ORDER — NITROGLYCERIN 0.4 MG SL SUBL: 0.4000 mg | SUBLINGUAL_TABLET | SUBLINGUAL | Status: DC | PRN

## 2016-12-28 MED ORDER — BUPIVACAINE-EPINEPHRINE 0.5% -1:200000 IJ SOLN
INTRAMUSCULAR | Status: DC | PRN
Start: 2016-12-28 — End: 2016-12-28
  Administered 2016-12-28: 3.6 mL

## 2016-12-28 MED ORDER — FENTANYL CITRATE (PF) 100 MCG/2ML IJ SOLN
INTRAMUSCULAR | Status: DC | PRN
  Administered 2016-12-28: 25 ug via INTRAVENOUS
  Administered 2016-12-28: 50 ug via INTRAVENOUS
  Administered 2016-12-28: 25 ug via INTRAVENOUS

## 2016-12-28 MED ORDER — TRAMADOL HCL 50 MG PO TABS
100.0000 mg | ORAL_TABLET | Freq: Three times a day (TID) | ORAL | Status: DC | PRN
  Administered 2016-12-28: 100 mg via ORAL
  Filled 2016-12-28: qty 2

## 2016-12-28 MED ORDER — LACTATED RINGERS IV SOLN
INTRAVENOUS | Status: DC
  Administered 2016-12-28: 18:00:00 via INTRAVENOUS

## 2016-12-28 MED ORDER — GUAIFENESIN 100 MG/5ML PO SOLN
100.0000 mg | Freq: Three times a day (TID) | ORAL | Status: DC | PRN
Start: 2016-12-28 — End: 2016-12-29

## 2016-12-28 MED ORDER — METOPROLOL TARTRATE 12.5 MG HALF TABLET
12.5000 mg | ORAL_TABLET | Freq: Two times a day (BID) | ORAL | Status: DC
  Administered 2016-12-28: 12.5 mg via ORAL
  Filled 2016-12-28: qty 1

## 2016-12-28 MED ORDER — DEXAMETHASONE SODIUM PHOSPHATE 10 MG/ML IJ SOLN
INTRAMUSCULAR | Status: AC
  Filled 2016-12-28: qty 1

## 2016-12-28 MED ORDER — AMINOCAPROIC ACID SOLUTION 5% (50 MG/ML)
10.0000 mL | ORAL | Status: DC
  Filled 2016-12-28: qty 100

## 2016-12-28 MED ORDER — LIDOCAINE HCL (CARDIAC) 20 MG/ML IV SOLN
INTRAVENOUS | Status: DC | PRN
  Administered 2016-12-28: 50 mg via INTRAVENOUS

## 2016-12-28 MED ORDER — IRBESARTAN 75 MG PO TABS: 37.5000 mg | ORAL_TABLET | Freq: Every day | ORAL | Status: DC

## 2016-12-28 MED ORDER — FENTANYL CITRATE (PF) 100 MCG/2ML IJ SOLN: 25.0000 ug | INTRAMUSCULAR | Status: DC | PRN

## 2016-12-28 MED ORDER — HYDROCODONE-ACETAMINOPHEN 7.5-325 MG/15ML PO SOLN: ORAL | 0 refills | Status: DC

## 2016-12-28 MED ORDER — DILTIAZEM HCL ER COATED BEADS 180 MG PO CP24: 180.0000 mg | ORAL_CAPSULE | Freq: Every day | ORAL | Status: DC

## 2016-12-28 MED ORDER — SUGAMMADEX SODIUM 200 MG/2ML IV SOLN
INTRAVENOUS | Status: AC
  Filled 2016-12-28: qty 2

## 2016-12-28 MED ORDER — DEXAMETHASONE SODIUM PHOSPHATE 10 MG/ML IJ SOLN
INTRAMUSCULAR | Status: DC | PRN
  Administered 2016-12-28: 10 mg via INTRAVENOUS

## 2016-12-28 MED ORDER — LACTATED RINGERS IV SOLN
INTRAVENOUS | Status: DC | PRN
  Administered 2016-12-28 (×2): via INTRAVENOUS

## 2016-12-28 MED ORDER — ISOSORBIDE DINITRATE 30 MG PO TABS
30.0000 mg | ORAL_TABLET | Freq: Two times a day (BID) | ORAL | Status: DC
  Filled 2016-12-28: qty 1

## 2016-12-28 MED ORDER — ALBUTEROL SULFATE (2.5 MG/3ML) 0.083% IN NEBU: 3.0000 mL | INHALATION_SOLUTION | Freq: Four times a day (QID) | RESPIRATORY_TRACT | Status: DC | PRN

## 2016-12-28 MED ORDER — PHENYLEPHRINE HCL 10 MG/ML IJ SOLN
INTRAVENOUS | Status: DC | PRN
  Administered 2016-12-28: 15 ug/min via INTRAVENOUS

## 2016-12-28 MED ORDER — BUPIVACAINE-EPINEPHRINE (PF) 0.5% -1:200000 IJ SOLN
INTRAMUSCULAR | Status: AC
  Filled 2016-12-28: qty 3.6

## 2016-12-28 MED ORDER — SUCCINYLCHOLINE CHLORIDE 200 MG/10ML IV SOSY
PREFILLED_SYRINGE | INTRAVENOUS | Status: DC | PRN
  Administered 2016-12-28: 50 mg via INTRAVENOUS

## 2016-12-28 MED ORDER — PROPOFOL 10 MG/ML IV BOLUS
INTRAVENOUS | Status: AC
Start: 2016-12-28 — End: ?
  Filled 2016-12-28: qty 20

## 2016-12-28 MED ORDER — BOOST / RESOURCE BREEZE PO LIQD
1.0000 | Freq: Three times a day (TID) | ORAL | Status: DC
  Administered 2016-12-28: 1 via ORAL

## 2016-12-28 MED ORDER — FENTANYL CITRATE (PF) 250 MCG/5ML IJ SOLN
INTRAMUSCULAR | Status: AC
  Filled 2016-12-28: qty 5

## 2016-12-28 MED ORDER — ONDANSETRON HCL 4 MG/2ML IJ SOLN
INTRAMUSCULAR | Status: AC
Start: 2016-12-28 — End: ?
  Filled 2016-12-28: qty 2

## 2016-12-28 MED ORDER — OXYMETAZOLINE HCL 0.05 % NA SOLN
NASAL | Status: DC | PRN
  Administered 2016-12-28 (×2): 2 via NASAL

## 2016-12-28 MED ORDER — HYDROCODONE-ACETAMINOPHEN 7.5-325 MG/15ML PO SOLN: 10.0000 mL | Freq: Four times a day (QID) | ORAL | Status: DC | PRN

## 2016-12-28 SURGICAL SUPPLY — 38 items
ALCOHOL 70% 16 OZ (MISCELLANEOUS) ×3 IMPLANT
ATTRACTOMAT 16X20 MAGNETIC DRP (DRAPES) ×3 IMPLANT
BLADE SURG 15 STRL LF DISP TIS (BLADE) ×2 IMPLANT
BLADE SURG 15 STRL SS (BLADE) ×6
COVER SURGICAL LIGHT HANDLE (MISCELLANEOUS) ×3 IMPLANT
GAUZE PACKING FOLDED 2  STR (GAUZE/BANDAGES/DRESSINGS) ×2
GAUZE PACKING FOLDED 2 STR (GAUZE/BANDAGES/DRESSINGS) ×1 IMPLANT
GAUZE SPONGE 4X4 12PLY STRL LF (GAUZE/BANDAGES/DRESSINGS) ×3 IMPLANT
GAUZE SPONGE 4X4 16PLY XRAY LF (GAUZE/BANDAGES/DRESSINGS) ×6 IMPLANT
GLOVE BIOGEL PI IND STRL 6 (GLOVE) ×1 IMPLANT
GLOVE BIOGEL PI INDICATOR 6 (GLOVE) ×2
GLOVE SURG ORTHO 8.0 STRL STRW (GLOVE) ×3 IMPLANT
GLOVE SURG SS PI 6.0 STRL IVOR (GLOVE) ×3 IMPLANT
GOWN STRL REUS W/ TWL LRG LVL3 (GOWN DISPOSABLE) ×1 IMPLANT
GOWN STRL REUS W/TWL 2XL LVL3 (GOWN DISPOSABLE) ×3 IMPLANT
GOWN STRL REUS W/TWL LRG LVL3 (GOWN DISPOSABLE) ×3
HEMOSTAT SURGICEL 2X14 (HEMOSTASIS) ×3 IMPLANT
KIT BASIN OR (CUSTOM PROCEDURE TRAY) ×3 IMPLANT
KIT ROOM TURNOVER OR (KITS) ×3 IMPLANT
MANIFOLD NEPTUNE WASTE (CANNULA) ×3 IMPLANT
NDL BLUNT 16X1.5 OR ONLY (NEEDLE) ×1 IMPLANT
NEEDLE BLUNT 16X1.5 OR ONLY (NEEDLE) ×3 IMPLANT
NS IRRIG 1000ML POUR BTL (IV SOLUTION) ×3 IMPLANT
PACK EENT II TURBAN DRAPE (CUSTOM PROCEDURE TRAY) ×3 IMPLANT
PAD ARMBOARD 7.5X6 YLW CONV (MISCELLANEOUS) ×3 IMPLANT
SPONGE SURGIFOAM ABS GEL 100 (HEMOSTASIS) IMPLANT
SPONGE SURGIFOAM ABS GEL 12-7 (HEMOSTASIS) IMPLANT
SPONGE SURGIFOAM ABS GEL SZ50 (HEMOSTASIS) IMPLANT
SUCTION FRAZIER HANDLE 10FR (MISCELLANEOUS) ×2
SUCTION TUBE FRAZIER 10FR DISP (MISCELLANEOUS) ×1 IMPLANT
SUT CHROMIC 3 0 PS 2 (SUTURE) ×6 IMPLANT
SUT CHROMIC 4 0 P 3 18 (SUTURE) ×10 IMPLANT
SYR 50ML SLIP (SYRINGE) ×3 IMPLANT
TOWEL OR 17X26 10 PK STRL BLUE (TOWEL DISPOSABLE) ×3 IMPLANT
TUBE CONNECTING 12'X1/4 (SUCTIONS) ×1
TUBE CONNECTING 12X1/4 (SUCTIONS) ×2 IMPLANT
WATER TABLETS ICX (MISCELLANEOUS) ×3 IMPLANT
YANKAUER SUCT BULB TIP NO VENT (SUCTIONS) ×3 IMPLANT

## 2016-12-28 NOTE — Progress Notes (Signed)
Pt bladder scanned for 150 cc , will walk to BR when pt gets room,

## 2016-12-28 NOTE — Discharge Instructions (Signed)

## 2016-12-28 NOTE — Anesthesia Postprocedure Evaluation (Signed)
Anesthesia Post Note  Patient: [name]  Procedure(s) Performed: Procedure(s) (LRB): Extraction of tooth #'s 2- 5,7-10, 12,13,17-20,and 22-29 with alveoloplasty and maxillary right and left lateral exostoses reductions (N/A)     Patient location during evaluation: PACU Anesthesia Type: General Level of consciousness: awake Pain management: pain level controlled Vital Signs Assessment: post-procedure vital signs reviewed and stable Respiratory status: spontaneous breathing Cardiovascular status: stable Anesthetic complications: no    Last Vitals:  Vitals:   12/28/16 0649 12/28/16 1129  BP: (!) 148/53   Pulse: 73 60  Resp: 16   Temp: 36.8 C (!) 36.1 C    Last Pain: There were no vitals filed for this visit.               Hiroshi Krummel

## 2016-12-28 NOTE — Anesthesia Postprocedure Evaluation (Signed)
Anesthesia Post Note  Patient: [name]  Procedure(s) Performed: Procedure(s) (LRB): Extraction of tooth #'s 2- 5,7-10, 12,13,17-20,and 22-29 with alveoloplasty and maxillary right and left lateral exostoses reductions (N/A)     Patient location during evaluation: PACU Anesthesia Type: General Level of consciousness: awake Pain management: pain level controlled Vital Signs Assessment: post-procedure vital signs reviewed and stable Respiratory status: spontaneous breathing Cardiovascular status: stable Anesthetic complications: no    Last Vitals:  Vitals:   12/28/16 0649 12/28/16 1129  BP: (!) 148/53   Pulse: 73   Resp: 16   Temp: 36.8 C (!) (P) 36.1 C    Last Pain: There were no vitals filed for this visit.               Darriel Sinquefield

## 2016-12-28 NOTE — Anesthesia Procedure Notes (Signed)
Arterial Line Insertion Start/End8/12/2016 8:10 AM, 12/28/2016 8:16 AM Performed by: Belinda Block, Rafaelita Foister P, anesthesiologist, CRNA  Preanesthetic checklist: patient identified, IV checked, site marked, risks and benefits discussed, surgical consent, monitors and equipment checked, pre-op evaluation and timeout performed Lidocaine 1% used for infiltration Right, radial was placed Catheter size: 20 G  Attempts: 1 Procedure performed without using ultrasound guided technique. Following insertion, Biopatch and dressing applied. Post procedure assessment: normal  Patient tolerated the procedure well with no immediate complications.

## 2016-12-28 NOTE — Op Note (Signed)
OPERATIVE REPORT  Patient:            [name] Date of Birth:  1928-06-03 MRN:                [id number]   DATE OF PROCEDURE:  12/28/2016  PREOPERATIVE DIAGNOSES: 1. Severe aortic stenosis 2. Pre-heart valve surgery dental protocol 3. Chronic apical periodontitis 4. Retained root segments 5. Dental caries 6. Chronic periodontitis 7. Loose teeth 8. Bilateral maxillary lateral exostoses  POSTOPERATIVE DIAGNOSES: 1. Severe aortic stenosis 2. Pre-heart valve surgery dental protocol 3. Chronic apical periodontitis 4. Retained root segments 5. Dental caries 6. Chronic periodontitis 7. Loose teeth 8. Bilateral maxillary lateral exostoses  OPERATIONS: 1. Multiple extraction of tooth numbers 2-5, 7-10, 12,13,17-20, and 22-29 2. 4 quadrants of alveoloplasty 3. Bilateral maxillary lateral exostoses reductions   SURGEON: Lenn Cal, DDS  ASSISTANT: Camie Patience, (dental assistant)  ANESTHESIA: General anesthesia via nasoendotracheal tube.  MEDICATIONS: 1. Ancef 2 g IV prior to invasive dental procedures. 2. Local anesthesia with a total utilization of 6 carpules each containing 34 mg of lidocaine with 0.017 mg of epinephrine as well as 2 carpules each containing 9 mg of bupivacaine with 0.009 mg of epinephrine. 3. Amicar 5% oral rinse  SPECIMENS: There are 22 teeth that were discarded.  DRAINS: None  CULTURES: None  COMPLICATIONS: None  ESTIMATED BLOOD LOSS: 150 mLs.  INTRAVENOUS FLUIDS: 800 mLs of Lactated ringers solution.  INDICATIONS: The patient was recently diagnosed with severe aortic stenosis.  A medically necessary dental consultation was then requested to evaluate poor dentition.  The patient was examined and treatment planned for multiple dental extractions with alveoloplasty and pre-prosthetic surgery as needed in the operating room with general anesthesia. This treatment plan was formulated to decrease the risks and complications associated  with dental infection from affecting the patient's systemic health and anticipated heart valve surgery.  OPERATIVE FINDINGS: Patient was examined operating room number 10.  The teeth were identified for extraction. All remaining teeth with the exception of the impacted tooth numbers 6 and 11 would be extracted at this time. The patient was noted to be affected by chronic apical periodontitis, multiple retained root segments, dental caries, chronic periodontitis, loose teeth, and the presence of bilateral maxillary lateral exostosis in the area of tooth numbers 2 through 14.   DESCRIPTION OF PROCEDURE: Patient was brought to the main operating room number 10. Patient was then placed in the supine position on the operating table. General anesthesia was then induced per the anesthesia team. The patient was then prepped and draped in the usual manner for dental medicine procedure. A timeout was performed. The patient was identified and procedures were verified. A throat pack was placed at this time. The oral cavity was then thoroughly examined with the findings noted above. The patient was then ready for dental medicine procedure as follows:  Local anesthesia was then administered sequentially with a total utilization of 6 carpules each containing 34 mg of lidocaine with 0.017 mg of epinephrine as well as 2 carpules  each containing 9 mg bupivacaine with 0.009 mg of epinephrine.  The Maxillary left and right quadrants were first approached. Anesthesia was then delivered utilizing infiltration with lidocaine with epinephrine. A #15 blade incision was then made from the maxillary right tuberosity and extended to the distal of #15.  A  surgical flap was then carefully reflected. The maxillary teeth were then subluxated with a series of straight elevators. Appropriate amounts of buccal and  interseptal bone were then removed utilizing a surgical handpiece and bur and copious amounts of sterile water around tooth  numbers 3, 4, 5, 12, and 13.  The teeth were then resubluxated with a series of straight elevators. Tooth numbers 2, 3, 4, 5, 7, 8, 9, 10, 12, and 13 were then removed with a 150 forceps.  The bilateral maxillary lateral exostoses were then removed utilizing a rongeurs and bone file. Further alveoloplasty was then performed utilizing a ronguers and bone file to help achieve closure. The surgical site was then irrigated with copious amounts of sterile saline 4.  The tissues were approximated and trimmed appropriately. The surgical site was then closed from the maxillary right tuberosity and extended the mesial #8 utilizing 3-0 chromic gut suture in a continuous interrupted suture technique 1. The maxillary left surgical site was then closed from the distal of #15 extended the mesial #9 utilizing 3-0 chromic gut suture in a continuous interrupted suture technique 1. 2 individual interrupted sutures are then placed to further close the surgical site as needed with 4-0 chromic gut material.  At this point time, the mandibular quadrants were approached. The patient was given bilateral inferior alveolar nerve blocks and long buccal nerve blocks utilizing the bupivacaine with epinephrine. Further infiltration was then achieved utilizing the lidocaine with epinephrine. A 15 blade incision was then made from the distal of number 17 and extended to the distal of #30.   A surgical flap was then carefully reflected. The lower teeth were then subluxated with a series of straight elevators. Appropriate amounts of buccal and interseptal bone were then removed utilizing a surgical handpiece and copious amount of sterile water around tooth numbers 17, 18, 19, 20, 23, 25, 26, 27, 28, and 29. Tooth numbers  17, 18, 19 were then removed with a 23 forceps.  Tooth numbers 20, 22, 23, 24, 25, 26, 27, 28, 29 were then removed with a 151 forceps. Alveoloplasty was then performed utilizing a rongeurs and bone file to help achieve  primary closure. The tissues were approximated and trimmed appropriately. The surgical sites were then irrigated with copious amounts of sterile saline 4. The mandibular left surgical site was then closed from the distal of 17 and extended the mesial #24 utilizing 3-0 chromic gut suture in a continuous interrupted suture technique 1. The mandibular right surgical site was then closed from the distal of #31 and extended the mesial #25 utilizing 3-0 chromic gut suture in a continuous interrupted suture technique 1.     At this point time, the entire mouth was irrigated with copious amounts of sterile saline. The patient was examined for complications, seeing none, the dental medicine procedure was deemed to be complete. Please note: There was a periodontal defect in the facial vestibule area #23/24 that I was unable to repair and this was left in the preoperative state.  The throat pack was removed at this time. An oral airway was then placed at the request of the anesthesia team. A series of 4 x 4 gauze moistened with Amicar 5% rinse were placed in the mouth to aid hemostasis. The patient was then handed over to the anesthesia team for final disposition. After an appropriate amount of time, the patient was extubated and taken to the postanesthsia care unit in good condition. All counts were correct for the dental medicine procedure. Patient is to continue the Amicar 5% rinse post operatively. Patient is rinse with 10 ML's every hour for the next 10 hours in  a swish and spit manner. Due to the extensive nature of the oral surgery, the patient will be kept in the hospital for overnight observation and then discharged tomorrow morning as indicated by the cardiology team.    Lenn Cal, DDS.

## 2016-12-28 NOTE — H&P (Signed)
Please refer to H&P from earlier today by Dr. Enrique Sack.     Cardiology H&P:   Patient ID: [name]; [id number]; 19-Jan-1929   Admit date: 12/28/2016 Date of Consult: 12/28/2016  Primary Care Provider: Denita Lung, MD Primary Cardiologist: Angelena Form   Patient Profile:   [name] is a 83 y.o. female with a hx of severe AS who is being admitted for observation due to protracted bleeding after multiple dental extractions in anticipation of TAVR.  History of Present Illness:   [name] has advanced and symptomatic AS and history of CAD and previous CABG, atrial fibrillation and suspected LA thrombus, Eliquis stopped 4 days ago. She had extractions of teeth 2-5, 7-10, 12,13,17-20, and 22-29 today and reduction of maxillary exostoses.  She still has gingival bleeding and will be observed overnight.  Currently denies angina and dyspnea. Communicating in writing, gauze in mouth.  Past Medical History:  Diagnosis Date  . Anxiety   . Arthritis    OSTEO  . Aspirin allergy    on Plavix  . Asthma   . Bronchitis   . CAD (coronary artery disease)    a. s/p CABG 2006 with Cox-Maze procedure.  . Carotid artery disease ([place])    a. s/p R CEA.  . Chronic stable angina ([place])   . Diastolic dysfunction   . Eczema   . Fibromyalgia   . GERD (gastroesophageal reflux disease)   . Heart murmur   . Hiatal hernia   . Hyperlipidemia   . Hypertension   . Kyphoscoliosis   . Mitral regurgitation   . Myocardial infarction [facility]) 2003   Stent to CFX  . PAF (paroxysmal atrial fibrillation) ([place])    a. pt has h/o hematuria on Eliquis and has since refused anticoagulation  . Pneumonia 2015 ?  Marland Kitchen Pulmonary regurgitation   . Severe aortic stenosis   . Skin cancer of arm   . Stroke ([place])   . Tricuspid regurgitation     Past Surgical History:  Procedure Laterality Date  . ABDOMINAL HYSTERECTOMY  1976  . CARDIAC SURGERY  stents  . CAROTID ENDARTERECTOMY  2010  . CORONARY ARTERY BYPASS  GRAFT  01/2005   LIMA-D1; SVG-LAD; SVG-OM; SVG-PDA  . EYE SURGERY    . RIGHT/LEFT HEART CATH AND CORONARY/GRAFT ANGIOGRAPHY N/A 10/27/2016   Procedure: Right/Left Heart Cath and Coronary/Graft Angiography;  Surgeon: Burnell Blanks, MD;  Location: [place] [facility];  Service: Cardiovascular;  Laterality: N/A;  . TONSILLECTOMY    . TUMOR REMOVAL       Inpatient Medications: Scheduled Meds:  Continuous Infusions: .  ceFAZolin (ANCEF) IV    . lactated ringers     PRN Meds: HYDROcodone-acetaminophen  Allergies:    Allergies  Allergen Reactions  . Bactrim [Sulfamethoxazole-Trimethoprim] Other (See Comments)    "makes her feel funny" or "unsteady"   . Amitriptyline Hcl Rash  . Aspirin Swelling and Rash  . Zetia [Ezetimibe] Rash    Social History:   Social History   Social History  . Marital status: Widowed    Spouse name: N/A  . Number of children: 3  . Years of education: N/A   Occupational History  .  Retired   Social History Main Topics  . Smoking status: Never Smoker  . Smokeless tobacco: Former Systems developer    Types: Snuff    Quit date: 05/23/2001  . Alcohol use No  . Drug use: No  . Sexual activity: Not Currently   Other Topics Concern  .  Not on file   Social History Narrative  . No narrative on file    Family History:    Family History  Problem Relation Age of Onset  . Heart failure Mother   . Other Father      ROS:  Please see the history of present illness.  ROS  All other ROS reviewed and negative.     Physical Exam/Data:   Vitals:   12/28/16 1406 12/28/16 1413 12/28/16 1420 12/28/16 1440  BP: 100/62  116/81 (!) 145/60  Pulse: 68 67 68 69  Resp: (!) 23 (!) 24 (!) 25 20  Temp:  (!) 97 F (36.1 C)  98.2 F (36.8 C)  TempSrc:    Axillary  SpO2: 94% 96% 95% 94%  Weight:    128 lb (58.1 kg)  Height:    5\' 5"  (1.651 m)    Intake/Output Summary (Last 24 hours) at 12/28/16 1740 Last data filed at 12/28/16 1300  Gross per 24 hour    Intake             1000 ml  Output              150 ml  Net              850 ml   Filed Weights   12/28/16 1440  Weight: 128 lb (58.1 kg)   Body mass index is 21.3 kg/m.  General:  Well nourished, well developed, in no acute distress, but anxious HEENT: normal Lymph: no adenopathy Neck: no JVD Endocrine:  No thryomegaly Vascular: No carotid bruits; FA pulses 2+ bilaterally; radiated carotid bruits  Cardiac:  normal S1, S2; RRR; no diastolic murmur and 3/6 aortic ejection murmur, mid peaking Lungs:  clear to auscultation bilaterally, no wheezing, rhonchi or rales  Abd: soft, nontender, no hepatomegaly  Ext: no edema Musculoskeletal:  No deformities, BUE and BLE strength normal and equal Skin: warm and dry  Neuro:  CNs 2-12 intact, no focal abnormalities noted Psych:  Normal affect   EKG:  None today Telemetry:  Telemetry was personally reviewed and demonstrates:  NSR  Relevant CV Studies: Echo 10/14/16: Left ventricle: The cavity size was normal. Wall thickness was increased in a pattern of mild LVH. Systolic function was normal. The estimated ejection fraction was in the range of 60% to 65%. Wall motion was normal; there were no regional wall motion abnormalities. Features are consistent with a pseudonormal left ventricular filling pattern, with concomitant abnormal relaxation and increased filling pressure (grade 2 diastolic dysfunction). - Aortic valve: Trileaflet; severely calcified leaflets. There was severe stenosis. Mean gradient (S): 54 mm Hg. Peak gradient (S): 101 mm Hg. Valve area (VTI): 0.45 cm^2. - Mitral valve: Mildly to moderately calcified annulus. Moderately calcified leaflets . There was mild regurgitation. - Left atrium: The atrium was mildly dilated. - Right ventricle: The cavity size was normal. Systolic function was normal. - Tricuspid valve: Peak RV-RA gradient (S): 36 mm Hg. - Pulmonary arteries: PA peak pressure: 44 mm Hg  (S). - Systemic veins: IVC measured 1.7 cm with < 50% respirophasic variation, suggesting RA pressure 8 mmHg.  Impressions:  - Normal LV size with mild LV hypertrophy. EF 60-65%. Moderate diastolic dysfunction. Severe aortic stenosis. Normal RV size and systolic function. Mild MR. Mild pulmonary hypertension.  CATH 10/27/2016  Ost Cx to Prox Cx lesion, 50 %stenosed.  Ost 2nd Mrg to 2nd Mrg lesion, 10 %stenosed.  SVG graft was visualized by angiography and is normal in caliber.  Mid RCA to Dist RCA lesion, 100 %stenosed.  SVG graft was visualized by angiography and is normal in caliber.  SVG graft was visualized by angiography and is normal in caliber.  Mid LAD lesion, 100 %stenosed.  1st Diag lesion, 50 %stenosed.  LIMA graft was visualized by non-selective angiography and is small.  Dist LAD lesion, 40 %stenosed.   1. Severe triple vessel CAD s/p 4V CABG with 3/4 patent bypass grafts 2. The mid LAD is chronically occluded. The vein graft to the mid LAD is patent 3. The proximal Circumflex has moderate stenosis. The mid Circumflex stent is patent. The vein graft to the second OM is patent 4. The mid RCA is chronically occluded. The vein graft to the distal RCA is patent 5. The LIMA to the Diagonal is atretic. The Diagonal branch as moderate non-obstructive disease.  6. Severe aortic stenosis (mean gradient 41.5 mmHg, peak to peak gradient 41 mmHg, AVA 0.76cm2).   Recommendations: Will continue planning for TAVR. She will be referred to see the CT surgeons on our TAVR team and will need to have her pre op testing arranged    Laboratory Data:  Chemistry Recent Labs Lab 12/26/16 1423  NA 141  K 3.9  CL 108  CO2 22  GLUCOSE 111*  BUN 11  CREATININE 0.43*  CALCIUM 9.6  GFRNONAA >60  GFRAA >60  ANIONGAP 11     Recent Labs Lab 12/26/16 1423  PROT 7.0  ALBUMIN 4.2  AST 35  ALT 68*  ALKPHOS 78  BILITOT 0.7   Hematology Recent Labs Lab  12/26/16 1423  WBC 6.4  RBC 3.73*  HGB 11.3*  HCT 35.0*  MCV 93.8  MCH 30.3  MCHC 32.3  RDW 15.0  PLT 162   Cardiac EnzymesNo results for input(s): TROPONINI in the last 168 hours. No results for input(s): TROPIPOC in the last 168 hours.  BNPNo results for input(s): BNP, PROBNP in the last 168 hours.  DDimer No results for input(s): DDIMER in the last 168 hours.  Radiology/Studies:  Dg Chest 2 View  Result Date: 12/26/2016 CLINICAL DATA:  Short of breath and weakness EXAM: CHEST  2 VIEW COMPARISON:  11/30/2015 FINDINGS: The heart is normal in size. Postop changes from sternotomy and CABG. Lungs are hyperaerated. Calcified granuloma at the right lung base. Mild patchy densities at the left base are worrisome for airspace disease. No pneumothorax. No pleural effusion. Dextroscoliosis in the midthoracic spine. IMPRESSION: Patchy root left basilar airspace disease. Electronically Signed   By: Marybelle Killings M.D.   On: 12/26/2016 16:03    Assessment and Plan:   1. S/P multiple tooth extraction: antibiotics, amicar mouth rinse, pressure for hemostasis 2. Severe AS: with plan for TAVR in near future. 3. Parox AFib:  Currently NSR, off Elquis since Saturday, to restart hopefully in next 24-48 hours.   Signed, Sanda Klein, MD  12/28/2016 5:40 PM

## 2016-12-28 NOTE — Anesthesia Preprocedure Evaluation (Addendum)
Anesthesia Evaluation  Patient identified by MRN, date of birth, ID band Patient awake    Reviewed: Allergy & Precautions, NPO status , Patient's Chart, lab work & pertinent test results  Airway Mallampati: II  TM Distance: >3 FB     Dental   Pulmonary asthma , pneumonia,    breath sounds clear to auscultation       Cardiovascular hypertension, + angina + CAD, + Past MI and + Peripheral Vascular Disease   Rhythm:Regular Rate:Normal     Neuro/Psych    GI/Hepatic Neg liver ROS, hiatal hernia, GERD  ,  Endo/Other  negative endocrine ROS  Renal/GU negative Renal ROS     Musculoskeletal  (+) Arthritis , Fibromyalgia -  Abdominal   Peds  Hematology   Anesthesia Other Findings   Reproductive/Obstetrics                             Anesthesia Physical Anesthesia Plan  ASA: III  Anesthesia Plan: General   Post-op Pain Management:    Induction: Intravenous  PONV Risk Score and Plan: 3 and Ondansetron, Dexamethasone, Midazolam and Propofol infusion  Airway Management Planned: Oral ETT  Additional Equipment:   Intra-op Plan:   Post-operative Plan: Possible Post-op intubation/ventilation  Informed Consent: I have reviewed the patients History and Physical, chart, labs and discussed the procedure including the risks, benefits and alternatives for the proposed anesthesia with the patient or authorized representative who has indicated his/her understanding and acceptance.   Dental advisory given  Plan Discussed with: CRNA and Anesthesiologist  Anesthesia Plan Comments:         Anesthesia Quick Evaluation

## 2016-12-28 NOTE — Progress Notes (Signed)
PRE-OPERATIVE NOTE:  12/28/2016 [name] [id number]  VITALS: BP (!) 148/53   Pulse 73   Temp 98.2 F (36.8 C)   Resp 16   Lab Results  Component Value Date   WBC 6.4 12/26/2016   HGB 11.3 (L) 12/26/2016   HCT 35.0 (L) 12/26/2016   MCV 93.8 12/26/2016   PLT 162 12/26/2016   BMET    Component Value Date/Time   NA 141 12/26/2016 1423   NA 143 10/24/2016 1240   K 3.9 12/26/2016 1423   CL 108 12/26/2016 1423   CO2 22 12/26/2016 1423   GLUCOSE 111 (H) 12/26/2016 1423   BUN 11 12/26/2016 1423   BUN 13 10/24/2016 1240   CREATININE 0.43 (L) 12/26/2016 1423   CREATININE 0.41 (L) 08/21/2015 0001   CALCIUM 9.6 12/26/2016 1423   GFRNONAA >60 12/26/2016 1423   GFRAA >60 12/26/2016 1423    Lab Results  Component Value Date   INR 1.07 12/26/2016   INR 1.0 10/24/2016   INR 1.17 05/20/2014   No results found for: PTT   [name] presents for multiple dental extractions with alveoloplasty and pre-prosthetic surgery as needed in the operating room with general anesthesia.     SUBJECTIVE: The patient denies any acute medical or dental changes and agrees to proceed with treatment as planned.  EXAM: No sign of acute dental changes.  ASSESSMENT: Patient is affected by severe aortic stenosis, chronic apical periodontitis, multiple dental caries, multiple retained root segments, chronic periodontitis, loose teeth, and lateral exostoses.  PLAN: Patient agrees to proceed with treatment as planned in the operating room as previously discussed and accepts the risks, benefits, and complications of the proposed treatment. Patient is aware of the risk for bleeding, bruising, swelling, infection, pain, nerve damage, soft tissue damage, future infection of the impacted teeth, sinus involvement, root tip fracture, mandible fracture, and the risks of complications associated with the anesthesia. Patient also is aware of the potential for other complications up to and including death due  to her overall cardiovascular and respiratory compromise.    Lenn Cal, DDS

## 2016-12-28 NOTE — Transfer of Care (Signed)
Immediate Anesthesia Transfer of Care Note  Patient: [name]  Procedure(s) Performed: Procedure(s): Extraction of tooth #'s 2- 5,7-10, [phone] 22-29 with alveoloplasty and maxillary right and left lateral exostoses reductions (N/A)  Patient Location: PACU  Anesthesia Type:General  Level of Consciousness: awake, drowsy and patient cooperative  Airway & Oxygen Therapy: Patient Spontanous Breathing and Patient connected to face mask oxygen  Post-op Assessment: Report given to RN, Post -op Vital signs reviewed and stable and Patient moving all extremities X 4  Post vital signs: Reviewed and stable  Last Vitals:  Vitals:   12/28/16 0649  BP: (!) 148/53  Pulse: 73  Resp: 16  Temp: 36.8 C    Last Pain: There were no vitals filed for this visit.       Complications: No apparent anesthesia complications

## 2016-12-28 NOTE — H&P (Signed)
12/28/2016  Patient:            [name] Date of Birth:  June 30, 1928 MRN:                [id number]   BP (!) 148/53   Pulse 73   Temp 98.2 F (36.8 C)   Resp [name] is an 83 year old female with severe aortic stenosis and anticipated heart valve surgery for early next week.  Patient now presents for multiple dental extractions with alveoloplasty and pre-prosthetic surgery as needed in the operating room with general anesthesia. Patient has been off of her anticoagulant therapy for 3 days and will remain off of the anticoagulant therapy until after the heart valve surgery next week. The patient currently denies any acute medical or dental changes.  Patient denies any significant symptoms of shortness of breath at this time. Please see Dr. Vivi Martens note of 12/07/2016 to act as the history and physical for the dental operating room procedure.  Lenn Cal, DDS  Gaye Pollack, MD  Cardiothoracic Surgery    [] Hide copied text Patient ID: [name], female   DOB: [birth year], 83 y.o.   MRN: [id number]  [facility] REPORT  Referring Provider is Burnell Blanks* PCP is Denita Lung, MD      Chief Complaint  Patient presents with  . Aortic Stenosis    2nd TAVR eval, review all studies, dental consultation 11/16/16    HPI:  The patient is an 83 year old woman with hypertension, hyperlipidemia, atrial fibrillation and CAD s/p CABG and MAZE in 2006 by Dr. Prescott Gum. She was found to have severe AS by cho in 11/2015 with a mean gradient of 47 mm Hg. She did not want to pursue further evaluation at that time. She has had atrial fibrillation bu refused anticoagulation due to hematuria on Eliquis in 2015. Her most recent echo on 10/14/2016 showed progression of her AS with an increase in the mean gradient to 54 mm Hg with a peak of 101. The LVEF was 60-65% with grade  2 diastolic dysfunction. Cardiac cath on 10/27/2016 showed severe native 3 vessel CAD with 3/4 patent bypass grafts. The mean AV gradient was 41.5 mm Hg with a peak to peak of 41 mm Hg. She is not very active due to difficulty with balance requiring a walker but reports exertional dyspnea with minimal activity. She has had some chest pain with exertion and dizziness with blurred vision. She feels like this has worsened over the past 6 months.  A filling defect was seen at the tip of the left atrial appendage on CTA of the chest concerning for possible left atrial thrombus. After discussion with the team she was started on Eliquis for a month preop.  She has been evaluated by Dr. Enrique Sack and will require extraction of multiple teeth preop. She is currently on Eliquis and that will have to be stopped prior to the extractions.      Past Medical History:  Diagnosis Date  . Arthritis    OSTEO  . Aspirin allergy    on Plavix  . Asthma   . Bronchitis   . CAD (coronary artery disease)    a. s/p CABG 2006 with Cox-Maze procedure.  . Carotid artery disease ([place])    a. s/p R CEA.  . Chronic stable angina ([place])   . Diastolic dysfunction   .  Eczema   . Fibromyalgia   . GERD (gastroesophageal reflux disease)   . Heart murmur   . Hiatal hernia   . Hyperlipidemia   . Hypertension   . Kyphoscoliosis   . Mitral regurgitation   . Myocardial infarction [facility]) 2003   Stent to CFX  . PAF (paroxysmal atrial fibrillation) ([place])    a. pt has h/o hematuria on Eliquis and has since refused anticoagulation  . Pneumonia 2015 ?  Marland Kitchen Pulmonary regurgitation   . Severe aortic stenosis   . Skin cancer of arm   . Stroke ([place])   . Tricuspid regurgitation          Past Surgical History:  Procedure Laterality Date  . ABDOMINAL HYSTERECTOMY  1976  . CARDIAC SURGERY  stents  . CAROTID ENDARTERECTOMY  2010  . CORONARY ARTERY BYPASS GRAFT  01/2005   LIMA-D1; SVG-LAD;  SVG-OM; SVG-PDA  . RIGHT/LEFT HEART CATH AND CORONARY/GRAFT ANGIOGRAPHY N/A 10/27/2016   Procedure: Right/Left Heart Cath and Coronary/Graft Angiography;  Surgeon: Burnell Blanks, MD;  Location: [facility];  Service: Cardiovascular;  Laterality: N/A;  . TONSILLECTOMY    . TUMOR REMOVAL           Family History  Problem Relation Age of Onset  . Heart failure Mother   . Other Father     Social History        Social History  . Marital status: Widowed    Spouse name: N/A  . Number of children: 3  . Years of education: N/A       Occupational History  .  Retired        Social History Main Topics  . Smoking status: Never Smoker  . Smokeless tobacco: Former Systems developer    Types: Snuff    Quit date: 05/23/2001  . Alcohol use No  . Drug use: No  . Sexual activity: Not Currently       Other Topics Concern  . Not on file      Social History Narrative  . No narrative on file          Current Outpatient Prescriptions  Medication Sig Dispense Refill  . acetaminophen (TYLENOL) 650 MG CR tablet Take 650-1,300 mg by mouth 2 (two) times daily as needed for pain.    Marland Kitchen apixaban (ELIQUIS) 2.5 MG TABS tablet Take 1 tablet (2.5 mg total) by mouth 2 (two) times daily. 60 tablet 2  . calcium-vitamin D (OSCAL WITH D) 500-200 MG-UNIT per tablet Take 1 tablet by mouth daily with breakfast.    . cholecalciferol (VITAMIN D) 1000 UNITS tablet Take 1,000 Units by mouth daily.    . diazepam (VALIUM) 2 MG tablet Take 2 mg by mouth every 6 (six) hours as needed (dizziness). Reported on 12/08/2015    . diltiazem (CARDIZEM CD) 180 MG 24 hr capsule TAKE 1 CAPSULE BY MOUTH DAILY 30 capsule 11  . FLOVENT HFA 110 MCG/ACT inhaler inhale 1 puff by mouth twice a day 12 g 1  . fluticasone (FLONASE) 50 MCG/ACT nasal spray instill 2 sprays into each nostril once daily 16 g 3  . guaiFENesin (MUCINEX) 600 MG 12 hr tablet Take 1 tablet (600 mg total) by mouth 2 (two) times  daily. 30 tablet 0  . guaiFENesin (ROBITUSSIN) 100 MG/5ML liquid Take 100 mg by mouth 3 (three) times daily as needed for cough. Reported on 12/08/2015    . isosorbide dinitrate (ISORDIL) 30 MG tablet TAKE 1 TABLET BY MOUTH 2  TIMES DAILY 60 tablet 7  . loratadine (CLARITIN) 10 MG tablet Take 10 mg by mouth daily.      . metoprolol tartrate (LOPRESSOR) 25 MG tablet take 1/2 tablet by mouth twice a day 60 tablet 11  . nitroGLYCERIN (NITROSTAT) 0.4 MG SL tablet place 1 tablet UNDER THE TONGUE EVERY 5 MINUTES if needed for chest pain MAX OF 3 TABS (Patient taking differently: Place 0.4 mg under the tongue every 5 (five) minutes as needed for chest pain. place 1 tablet UNDER THE TONGUE EVERY 5 MINUTES if needed for chest pain MAX OF 3 TABS) 25 tablet 6  . pantoprazole (PROTONIX) 40 MG tablet Take 1 tablet (40 mg total) by mouth 2 (two) times daily. 180 tablet 3  . rosuvastatin (CRESTOR) 40 MG tablet take 1 tablet by mouth once daily (Patient taking differently: take 1 tablet by mouth once daily in the evening) 90 tablet 1  . traMADol (ULTRAM) 50 MG tablet take 2 tablets by mouth three times a day if needed for pain (Patient taking differently: Take 100 mg by mouth 3 (three) times daily as needed (for pain (typically in the evening)). take 2 tablets by mouth three times a day if needed for pain) 60 tablet 3  . valsartan (DIOVAN) 40 MG tablet TAKE 1 TABLET BY MOUTH ONCE DAILY 90 tablet 3  . VENTOLIN HFA 108 (90 Base) MCG/ACT inhaler INHALE 2 PUFFS BY MOUTH EVERY 6 HOURS AS NEEDED FOR WHEEZING OR SHORTNESS OF BREATH 18 Inhaler 5   No current facility-administered medications for this visit.          Allergies  Allergen Reactions  . Bactrim [Sulfamethoxazole-Trimethoprim] Other (See Comments)    "makes her feel funny" or "unsteady"   . Amitriptyline Hcl Rash  . Aspirin Swelling and Rash  . Zetia [Ezetimibe] Rash      Review of Systems:              General:                       normal appetite, reduced energy, no weight gain, no weight loss, no fever             Cardiac:                       has chest pain with exertion, no chest pain at rest, has SOB with minimal exertion, no resting SOB, no PND, no orthopnea, has palpitations, has arrhythmia, has atrial fibrillation, no LE edema, has dizzy spells, no syncope             Respiratory:                 exertional shortness of breath, no home oxygen, no productive cough, no dry cough, no bronchitis, no wheezing, no hemoptysis, no asthma, no pain with inspiration or cough, no sleep apnea, no CPAP at night             GI:                               no difficulty swallowing, has reflux, no frequent heartburn, has hiatal hernia, no abdominal pain, no constipation, no diarrhea, no hematochezia, no hematemesis, no melena             GU:  no dysuria,  no frequency, no urinary tract infection, no hematuria, no kidney stones, no kidney disease             Vascular:                     no pain suggestive of claudication, no pain in feet, no leg cramps, no varicose veins, no DVT, no non-healing foot ulcer             Neuro:                         prior stroke, no TIA's, no seizures, no headaches, no temporary blindness one eye,  no slurred speech, no peripheral neuropathy, no chronic pain, has instability of gait, no memory/cognitive dysfunction             Musculoskeletal:         has arthritis, no joint swelling, has myalgias, has difficulty walking, reduced mobility              Skin:                            no rash, no itching, no skin infections, no pressure sores or ulcerations, skin cancer             Psych:                         no anxiety, no depression, no nervousness, no unusual recent stress             Eyes:                           no blurry vision, no floaters, no recent vision changes,  wears glasses              ENT:                            has hearing loss, has loose or painful  teeth, no dentures, saw Dr. Enrique Sack recently and requires extractions.             Hematologic:               no easy bruising, no abnormal bleeding, no clotting disorder, no frequent epistaxis             Endocrine:                   no diabetes, does not check CBG's at home                             Physical Exam:              BP 135/72 (BP Location: Left Arm, Patient Position: Sitting, Cuff Size: Normal)   Pulse 60   Resp 16   Ht 5\' 5"  (1.651 m)   Wt 128 lb (58.1 kg)   SpO2 97% Comment: RA  BMI 21.30 kg/m              General:                      Elderly and frail but  well-appearing, in a wheel chair  HEENT:                       Unremarkable, NCAT, PERLA, EOMI, oropharynx clear. Teeth in poor condition             Neck:                           no JVD, no bruits, no adenopathy or thyromegaly             Chest:                          clear to auscultation, symmetrical breath sounds, no wheezes, no rhonchi              CV:                              RRR, grade III/VI crescendo/decrescendo murmur heard best at RSB,  no diastolic murmur             Abdomen:                    soft, non-tender, no masses or organomegaly             Extremities:                 warm, well-perfused, pulses palpable, no LE edema             Rectal/GU                   Deferred             Neuro:                         Grossly non-focal and symmetrical throughout             Skin:                            Clean and dry, no rashes, no breakdown   Diagnostic Tests:  [name] [address], [place] [address] [phone]  ------------------------------------------------------------------- Transthoracic Echocardiography  Patient: Shequita, Peplinski MR #: [id number] Study Date: 10/14/2016 Gender: F Age: 89 Height: 165.1 cm Weight: 59 kg BSA:  1.65 m^2 Pt. Status: Room:  ATTENDING Loralie Champagne, M.D. SONOGRAPHER Wyatt Mage, RDCS ORDERING [place], Christopher REFERRING [place], Christopher PERFORMING [facility], Outpatient  cc:  ------------------------------------------------------------------- LV EF: 60% - 65%  ------------------------------------------------------------------- Indications: Aortic stenosis (I35).  ------------------------------------------------------------------- History: PMH: Dyspnea. Atrial fibrillation. Coronary artery disease. Aortic valve disease. Risk factors: Edema. Carotid stenosis. Dyslipidemia.  ------------------------------------------------------------------- Study Conclusions  - Left ventricle: The cavity size was normal. Wall thickness was increased in a pattern of mild LVH. Systolic function was normal. The estimated ejection fraction was in the range of 60% to 65%. Wall motion was normal; there were no regional wall motion abnormalities. Features are consistent with a pseudonormal left ventricular filling pattern, with concomitant abnormal relaxation and increased filling pressure (grade 2 diastolic dysfunction). - Aortic valve: Trileaflet; severely calcified leaflets. There was severe stenosis. Mean gradient (S): 54 mm Hg. Peak gradient (S): 101 mm Hg. Valve area (VTI): 0.45 cm^2. - Mitral valve: Mildly to moderately calcified annulus. Moderately calcified leaflets . There was mild regurgitation. - Left atrium: The  atrium was mildly dilated. - Right ventricle: The cavity size was normal. Systolic function was normal. - Tricuspid valve: Peak RV-RA gradient (S): 36 mm Hg. - Pulmonary arteries: PA peak pressure: 44 mm Hg (S). - Systemic veins: IVC measured 1.7 cm with < 50% respirophasic variation, suggesting RA pressure 8 mmHg.  Impressions:  - Normal LV size with mild LV hypertrophy. EF 60-65%.  Moderate diastolic dysfunction. Severe aortic stenosis. Normal RV size and systolic function. Mild MR. Mild pulmonary hypertension.  ------------------------------------------------------------------- Labs, prior tests, procedures, and surgery: Transthoracic echocardiography (12/03/2015). The aortic valve showed severe stenosis. EF was 60% and PA pressure was 45 (systolic). Aortic valve: peak gradient of 80 mm Hg and mean gradient of 47 mm Hg.  Coronary artery bypass grafting.  ------------------------------------------------------------------- Study data: Comparison was made to the study of 12/20/2015. Study status: Routine. Procedure: The patient reported no pain pre or post test. Transthoracic echocardiography. Image quality was adequate. The study was technically difficult, as a result of poor patient compliance. Study completion: There were no complications. Transthoracic echocardiography. M-mode, complete 2D, spectral Doppler, and color Doppler. Birthdate: Patient birthdate: 1928-10-29. Age: Patient is 83 yr old. Sex: Gender: female. BMI: 21.6 kg/m^2. Blood pressure: 118/70 Patient status: Outpatient. Study date: Study date: 10/14/2016. Study time: 01:01 PM. Location: [facility]  -------------------------------------------------------------------  ------------------------------------------------------------------- Left ventricle: The cavity size was normal. Wall thickness was increased in a pattern of mild LVH. Systolic function was normal. The estimated ejection fraction was in the range of 60% to 65%. Wall motion was normal; there were no regional wall motion abnormalities. Features are consistent with a pseudonormal left ventricular filling pattern, with concomitant abnormal relaxation and increased filling pressure (grade 2 diastolic  dysfunction).  ------------------------------------------------------------------- Aortic valve: Trileaflet; severely calcified leaflets. Doppler: There was severe stenosis. There was no regurgitation. VTI ratio of LVOT to aortic valve: 0.14. Valve area (VTI): 0.45 cm^2. Indexed valve area (VTI): 0.27 cm^2/m^2. Peak velocity ratio of LVOT to aortic valve: 0.14. Valve area (Vmax): 0.44 cm^2. Indexed valve area (Vmax): 0.27 cm^2/m^2. Mean velocity ratio of LVOT to aortic valve: 0.14. Valve area (Vmean): 0.44 cm^2. Indexed valve area (Vmean): 0.27 cm^2/m^2. Mean gradient (S): 54 mm Hg. Peak gradient (S): 101 mm Hg.  ------------------------------------------------------------------- Aorta: Aortic root: The aortic root was normal in size. Ascending aorta: The ascending aorta was normal in size.  ------------------------------------------------------------------- Mitral valve: Mildly to moderately calcified annulus. Moderately calcified leaflets . Doppler: There was no evidence for stenosis. There was mild regurgitation. Valve area by pressure half-time: 3.38 cm^2. Indexed valve area by pressure half-time: 2.05 cm^2/m^2. Peak gradient (D): 9 mm Hg.  ------------------------------------------------------------------- Left atrium: The atrium was mildly dilated.  ------------------------------------------------------------------- Right ventricle: The cavity size was normal. Systolic function was normal.  ------------------------------------------------------------------- Pulmonic valve: Structurally normal valve. Cusp separation was normal. Doppler: Transvalvular velocity was within the normal range. There was trivial regurgitation.  ------------------------------------------------------------------- Tricuspid valve: Doppler: There was trivial regurgitation.  ------------------------------------------------------------------- Right atrium:  The atrium was normal in size.  ------------------------------------------------------------------- Pericardium: There was no pericardial effusion.  ------------------------------------------------------------------- Systemic veins: IVC measured 1.7 cm with <50% respirophasic variation, suggesting RA pressure 8 mmHg.  ------------------------------------------------------------------- Measurements  Left ventricle Value Reference LV ID, ED, PLAX chordal (L) 37 mm 43 - 52 LV ID, ES, PLAX chordal 28 mm 23 - 38 LV fx shortening, PLAX chordal (L) 24 % >=29 LV PW thickness, ED 9 mm --------- IVS/LV PW ratio, ED 1.22 <=1.3 Stroke volume, 2D 63 ml --------- Stroke volume/bsa, 2D  38 ml/m^2 --------- LV e&', lateral 6.25 cm/s --------- LV E/e&', lateral 24 --------- LV e&', medial 5.7 cm/s --------- LV E/e&', medial 26.32 --------- LV e&', average 5.98 cm/s --------- LV E/e&', average 25.1 ---------  Ventricular septum Value Reference IVS thickness, ED 11 mm ---------  LVOT Value Reference LVOT ID, S 20 mm --------- LVOT area 3.14 cm^2 --------- LVOT peak velocity, S 71.1 cm/s --------- LVOT mean velocity, S 48.2 cm/s --------- LVOT VTI, S 20 cm ---------  Aortic  valve Value Reference Aortic valve peak velocity, S 502 cm/s --------- Aortic valve mean velocity, S 341 cm/s --------- Aortic valve VTI, S 139 cm --------- Aortic mean gradient, S 54 mm Hg --------- Aortic peak gradient, S 101 mm Hg --------- VTI ratio, LVOT/AV 0.14 --------- Aortic valve area, VTI 0.45 cm^2 --------- Aortic valve area/bsa, VTI 0.27 cm^2/m^2 --------- Velocity ratio, peak, LVOT/AV 0.14 --------- Aortic valve area, peak velocity 0.44 cm^2 --------- Aortic valve area/bsa, peak 0.27 cm^2/m^2 --------- velocity Velocity ratio, mean, LVOT/AV 0.14 --------- Aortic valve area, mean velocity 0.44 cm^2 --------- Aortic valve area/bsa, mean 0.27 cm^2/m^2 --------- velocity  Aorta Value Reference Aortic root ID, ED 30 mm ---------  Left atrium Value Reference LA ID, A-P, ES 32 mm --------- LA ID/bsa, A-P 1.94 cm/m^2 <=2.2 LA volume, ES, 1-p A4C 50.1 ml --------- LA volume/bsa, ES, 1-p A4C 30.4 ml/m^2 ---------  Mitral valve Value Reference Mitral E-wave peak velocity 150 cm/s --------- Mitral A-wave peak velocity 41.9 cm/s --------- Mitral deceleration time 222 ms 150 - 230 Mitral pressure half-time 65 ms --------- Mitral peak gradient, D 9 mm Hg  --------- Mitral E/A ratio, peak 3.6 --------- Mitral valve area, PHT, DP 3.38 cm^2 --------- Mitral valve area/bsa, PHT, DP 2.05 cm^2/m^2 ---------  Pulmonary arteries Value Reference PA pressure, S, DP (H) 44 mm Hg <=30  Tricuspid valve Value Reference Tricuspid regurg peak velocity 300 cm/s --------- Tricuspid peak RV-RA gradient 36 mm Hg ---------  Systemic veins Value Reference Estimated CVP 8 mm Hg ---------  Pulmonic valve Value Reference Pulmonic regurg velocity, ED 92 cm/s ---------  Legend: (L) and (H) mark values outside specified reference range.  ------------------------------------------------------------------- Prepared and Electronically Authenticated by  Loralie Champagne, M.D. 2018-05-25T17:56:22  Physicians   Panel Physicians Referring Physician Case Authorizing Physician  Burnell Blanks, MD (Primary)    Procedures   Right/Left Heart Cath and Coronary/Graft Angiography  Conclusion     Ost Cx to Prox Cx lesion, 50 %stenosed.  Ost 2nd Mrg to 2nd Mrg lesion, 10 %stenosed.  SVG graft was visualized by angiography and is normal in caliber.  Mid RCA to Dist RCA lesion, 100 %stenosed.  SVG graft was visualized by angiography and is normal in caliber.  SVG graft was visualized by angiography and is normal in caliber.  Mid LAD lesion, 100 %stenosed.  1st Diag lesion, 50 %stenosed.  LIMA graft was visualized by non-selective angiography and is small.  Dist LAD lesion, 40 %stenosed.  1. Severe triple vessel CAD s/p 4V CABG with 3/4 patent bypass grafts 2. The mid LAD is chronically occluded. The vein  graft to the mid LAD is patent 3. The proximal Circumflex has moderate stenosis. The mid Circumflex stent is patent. The vein graft to the second OM is patent 4. The mid RCA is chronically occluded. The vein graft to the distal RCA is patent 5. The LIMA to the Diagonal is atretic. The Diagonal branch as moderate non-obstructive disease.  6. Severe aortic stenosis (mean gradient 41.5 mmHg, peak  to peak gradient 41 mmHg, AVA 0.76cm2).   Recommendations: Will continue planning for TAVR. She will be referred to see the CT surgeons on our TAVR team and will need to have her pre op testing arranged    Indications   Severe aortic stenosis [I35.0 (ICD-10-CM)]  Severe aortic valve stenosis [I35.0 (ICD-10-CM)]  Coronary artery disease of native artery of native heart with stable angina pectoris (HCC) [U93.235 (ICD-10-CM)]  Procedural Details/Technique   Technical Details Indication: 83 yo female with history of severe AS, mild to moderate MR, CAD s/p CABG in 2006 with recent worsened dyspnea and chest pain.   Procedure: The risks, benefits, complications, treatment options, and expected outcomes were discussed with the patient. The patient and/or family concurred with the proposed plan, giving informed consent. The patient was brought to the cath lab after IV hydration was begun and oral premedication was given. The patient was further sedated with Versed and Fentanyl. The right groin was prepped and draped in the usual manner. Using the modified Seldinger access technique, a 5 French sheath was placed in the right femoral artery. A 7 French sheath was placed in the right femoral vein. Right heart cath performed with a balloon tipped catheter. Standard diagnostic catheters were used to perform selective coronary angiography. I crossed the aortic valve with an AL-1 catheter and a straight wire.  There were no immediate complications. The patient was taken to the recovery area in stable condition.     Estimated blood loss <50 mL.  During this procedure the patient was administered the following to achieve and maintain moderate conscious sedation: Versed 1 mg, Fentanyl 25 mcg, while the patient's heart rate, blood pressure, and oxygen saturation were continuously monitored. The period of conscious sedation was 32 minutes, of which I was present face-to-face 100% of this time.    Complications   Complications documented before study signed (10/27/2016 1:04 PM EDT)    RIGHT/LEFT HEART CATH AND CORONARY/GRAFT ANGIOGRAPHY   None Documented by Burnell Blanks, MD 10/27/2016 1:03 PM EDT  Time Range: Intra-procedure      Coronary Findings   Dominance: Right  Left Anterior Descending  Mid LAD lesion, 100% stenosed. The lesion is chronically occluded.  Dist LAD lesion, 40% stenosed.  First Diagonal Branch  Vessel is moderate in size.  1st Diag lesion, 50% stenosed.  First Septal Branch  Vessel is small in size.  Second Diagonal Branch  Vessel is small in size.  Second Septal Branch  Vessel is small in size.  Third Diagonal Branch  Vessel is small in size.  Third Septal Branch  Vessel is small in size.  Left Circumflex  Ost Cx to Prox Cx lesion, 50% stenosed.  First Obtuse Marginal Branch  Vessel is small in size.  Second Obtuse Marginal Branch  Vessel is moderate in size.  Ost 2nd Mrg to 2nd Mrg lesion, 10% stenosed. The lesion was previously treated using a stent (unknown type) over 2 years ago.  Third Obtuse Marginal Branch  Vessel is small in size.  Right Coronary Artery  Mid RCA to Dist RCA lesion, 100% stenosed. The lesion is chronically occluded.  Graft Angiography  saphenous Graft to 2nd Mrg  SVG graft was visualized by angiography and is normal in caliber.  saphenous Graft to RPDA  SVG graft was visualized by angiography and is normal in caliber.  saphenous Graft to Mid LAD  SVG graft was visualized by angiography and is normal in caliber.  Free  LIMA Graft to  1st Diag  LIMA graft was visualized by non-selective angiography and is small.  Right Heart   Right Heart Pressures Elevated LV EDP consistent with volume overload.    Coronary Diagrams   Diagnostic Diagram       Implants        No implant documentation for this case.  PACS Images   Show images for Cardiac catheterization   Link to Procedure Log   Procedure Log    Hemo Data    Most Recent Value  Fick Cardiac Output 5.13 L/min  Fick Cardiac Output Index 3.15 (L/min)/BSA  Aortic Mean Gradient 41.5 mmHg  Aortic Peak Gradient 41 mmHg  Aortic Valve Area 0.76  Aortic Value Area Index 0.46 cm2/BSA  RA A Wave 7 mmHg  RA V Wave 7 mmHg  RA Mean 6 mmHg  RV Systolic Pressure 33 mmHg  RV Diastolic Pressure 2 mmHg  RV EDP 7 mmHg  PA Systolic Pressure 31 mmHg  PA Diastolic Pressure 13 mmHg  PA Mean 21 mmHg  PW A Wave 13 mmHg  PW V Wave 24 mmHg  PW Mean 14 mmHg  AO Systolic Pressure 545 mmHg  AO Diastolic Pressure 55 mmHg  AO Mean 85 mmHg  LV Systolic Pressure 625 mmHg  LV Diastolic Pressure 9 mmHg  LV EDP 20 mmHg  Arterial Occlusion Pressure Extended Systolic Pressure 638 mmHg  Arterial Occlusion Pressure Extended Diastolic Pressure 64 mmHg  Arterial Occlusion Pressure Extended Mean Pressure 100 mmHg  Left Ventricular Apex Extended Systolic Pressure 937 mmHg  Left Ventricular Apex Extended Diastolic Pressure 10 mmHg  Left Ventricular Apex Extended EDP Pressure 20 mmHg  QP/QS 1  TPVR Index 6.66 HRUI  TSVR Index 26.99 HRUI  PVR SVR Ratio 0.09  TPVR/TSVR Ratio 0.25    CT CORONARY MORPH W/CTA COR W/SCORE W/CA W/CM &/OR WO/CM (Accession [id number]) (Order [id number])  Imaging  Date: 11/01/2016 Department: [facility] CT IMAGING Released By: Garth Bigness D Authorizing: Burnell Blanks, MD  Exam Information   Status Exam Begun  Exam Ended   Final [99] 11/01/2016 1:30 PM 11/01/2016 2:12 PM  PACS Images   Show  images for CT CORONARY MORPH W/CTA COR W/SCORE W/CA W/CM &/OR WO/CM  Addendum   ADDENDUM REPORT: 11/01/2016 17:29  ADDENDUM: The LAA is dilated. There is thrombus in the mid and apical LAA seen in multiple gated phases   Electronically Signed By: Jenkins Rouge M.D. On: 11/01/2016 17:29   Addended by Josue Hector, MD on 11/01/2016 5:32 PM    ADDENDUM REPORT: 11/01/2016 16:26  CLINICAL DATA: Aortic stenosis  EXAM: Cardiac TAVR CT  TECHNIQUE: The patient was scanned on a Siemens 128 scanner. A 120 kV retrospective scan was triggered in the ascending thoracic aorta at 111 HU's. Gantry rotation speed was 300 msecs and collimation was .73mm. No beta blockade or nitro were given. The 3D data set was reconstructed in 10% intervals of the R-R cycle. Systolic and diastolic phases were analyzed on a dedicated work station using MPR, MIP and VRT modes. The patient received 80 cc of contrast.  FINDINGS: Aortic Valve: Tri -leaflet with severe calcification and restricted motion  Aorta: Normal root size. Moderate calcification of the STJ, root, arch and descending thoracic aorta  Mitral annular calcification  Three patent SVG;s noted as well as patent LIMA  Sinotubular Junction: 24 mm  Ascending Thoracic Aorta: 31 mm  Aortic Arch: 28 mm  Descending Thoracic Aorta: 24 mm  Sinus of Valsalva Measurements:  Non-coronary: 28 mm  Right -coronary: 25 mm  Left -coronary: 28 mm  Coronary Artery Height above Annulus:  Left Main: 13 mm above annulus  Right Coronary: 15 mm above annulus  Virtual Basal Annulus Measurements:  Maximum/Minimum Diameter: 17. X 25.5 mm  Perimeter: 70 mm  Area: 354 mm2  Coronary Arteries: Sufficient height above annulus for deployment and patent SVG;s and patent LIMA  Optimum Fluoroscopic Angle for Delivery: LAO 9 degrees CAU 8 degrees  IMPRESSION: 1) Calcified tri leaflet aortic valve  with annular area 354 mm2 suitable for a 23 mm Sapien 3 valve  2) Optimum angiographic angle for deployment LAO 9 CAU 8  3) Patent SVG;s and LIMA with native coronary arteries sufficient height above annulus for deployment  4) Significant calcification of the aorta and STJ  5) Mitral annular calcification  Jenkins Rouge   Electronically Signed By: Jenkins Rouge M.D. On: 11/01/2016 16:26       CT Angio Abd/Pel w/ and/or w/o (Accession [id number]) (Order [id number])  Imaging  Date: 11/01/2016 Department: [facility] CT IMAGING Released By: Sheffield Slider Authorizing: Burnell Blanks, MD  Exam Information   Status Exam Begun  Exam Ended   Final [99] 11/01/2016 1:32 PM 11/01/2016 2:13 PM  PACS Images   Show images for CT Angio Abd/Pel w/ and/or w/o  Study Result   CLINICAL DATA: 83 year old female with history of severe aortic stenosis. Preprocedural study prior to potential transcatheter aortic valve replacement (TAVR) procedure.  EXAM: CT ANGIOGRAPHY CHEST, ABDOMEN AND PELVIS  TECHNIQUE: Multidetector CT imaging through the chest, abdomen and pelvis was performed using the standard protocol during bolus administration of intravenous contrast. Multiplanar reconstructed images and MIPs were obtained and reviewed to evaluate the vascular anatomy.  CONTRAST: 70 mL of Isovue 370  COMPARISON: CT the abdomen and pelvis 04/24/2006. Chest CT 07/25/2003.  FINDINGS: CTA CHEST FINDINGS  Cardiovascular: Heart size is mildly enlarged. There is no significant pericardial fluid, thickening or pericardial calcification. There is aortic atherosclerosis, as well as atherosclerosis of the great vessels of the mediastinum and the coronary arteries, including calcified atherosclerotic plaque in the left main, left anterior descending, left circumflex and right coronary arteries. Status post median sternotomy for CABG  including LIMA to the LAD. Filling defect in the tip of the left atrial appendage concerning for potential thrombus. Thickening calcification of the aortic valve.  Mediastinum/Lymph Nodes: No pathologically enlarged mediastinal or hilar lymph nodes. Esophagus is unremarkable in appearance. No axillary lymphadenopathy.  Lungs/Pleura: Nodular areas of pleuroparenchymal thickening are noted in the apices of the lungs bilaterally, most compatible with areas of chronic post infectious or inflammatory scarring. Small calcified granuloma in the right lower lobe. No other larger more suspicious appearing pulmonary nodules or masses are noted. Small left-sided Bochdalek's hernia. No acute consolidative airspace disease. No pleural effusions.  Musculoskeletal/Soft Tissues: Liver has a shrunken appearance and nodular contour, suggestive of underlying cirrhosis. No definite cystic or solid hepatic lesions. No intra or extrahepatic biliary ductal dilatation. Tiny calcified gallstones lie dependently in the gallbladder. No findings to suggest an acute cholecystitis at this time.  CTA ABDOMEN AND PELVIS FINDINGS  Hepatobiliary: No pancreatic mass. No pancreatic ductal dilatation. No pancreatic or peripancreatic fluid or inflammatory changes.  Pancreas: No pancreatic mass. No pancreatic ductal dilatation. No pancreatic or peripancreatic fluid or inflammatory changes.  Spleen: Unremarkable.  Adrenals/Urinary Tract: 4.6 cm exophytic simple cyst in the interpolar region of the right kidney. Several areas of mild cortical scarring the  left kidney. subcentimeter low-attenuation lesion in the medial aspect of the interpolar region of the left kidney is too small to definitively characterize, but is statistically likely a tiny cyst. Bilateral adrenal glands are normal in appearance. No hydroureteronephrosis. Urinary bladder is normal in appearance.  Stomach/Bowel: Normal appearance of  the stomach. No pathologic dilatation of small bowel or colon. Numerous colonic diverticulae are noted, without surrounding inflammatory changes to suggest an acute diverticulitis at this time. The appendix is not confidently identified and may be surgically absent. Regardless, there are no inflammatory changes noted adjacent to the cecum to suggest the presence of an acute appendicitis at this time.  Vascular/Lymphatic: Aortic atherosclerosis. Vascular findings and measurements pertinent to potential TAVR procedure, as detailed below. Celiac axis, superior mesenteric artery and inferior mesenteric artery are all patent. 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 Single renal arteries bilaterally are widely patent. No lymphadenopathy noted in the abdomen or pelvis.  Reproductive: Status post hysterectomy. Ovaries are not confidently identified may be surgically absent or atrophic.  Other: Right inguinal hernia containing only fat. Tiny umbilical hernia containing only fat. No significant volume of ascites. No pneumoperitoneum.  Musculoskeletal: There are no aggressive appearing lytic or blastic lesions noted in the visualized portions of the skeleton.  VASCULAR MEASUREMENTS PERTINENT TO TAVR:  AORTA:  Minimal Aortic Diameter - 10 x 9 mm  Severity of Aortic Calcification - severe  RIGHT PELVIS:  Right Common Iliac Artery -  Minimal Diameter - 6.0 x 3.5 mm  Tortuosity - mild  Calcification - severe  Right External Iliac Artery -  Minimal Diameter - 5.4 x 5.2 mm  Tortuosity - mild  Calcification - none  Right Common Femoral Artery -  Minimal Diameter - 5.4 x 4.9 mm  Tortuosity - mild  Calcification - mild  LEFT PELVIS:  Left Common Iliac Artery -  Minimal Diameter - 2.1 x 6.7 mm  Tortuosity - mild  Calcification -  severe  Left External Iliac Artery -  Minimal Diameter - 5.6 x 6.0 mm  Tortuosity - mild  Calcification - none  Left Common Femoral Artery -  Minimal Diameter - 6.0 x 6.2 mm  Tortuosity - mild  Calcification - mild  Review of the MIP images confirms the above findings.  IMPRESSION: 1. Vascular findings and measurements pertinent to potential TAVR procedure, as detailed above. This patient does not appear to have suitable pelvic arterial access on either side. 2. Severe thickening calcification of the aortic valve, compatible with the reported clinical history of severe aortic stenosis. 3. **An incidental finding of potential clinical significance has been found. Filling defect in the tip of the left atrial appendage, concerning for left atrial thrombus. Correlation with transesophageal echocardiography is recommended if clinically appropriate, as this places the patient at risk for systemic embolization if thrombus is present.** 4. 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 5. Aortic atherosclerosis, in addition to left main three-vessel coronary artery disease. Please note that although the presence of coronary artery calcium documents the presence of coronary artery disease, the severity of this disease and any potential stenosis cannot be assessed on this non-gated CT examination. Assessment for potential risk factor modification, dietary therapy or pharmacologic therapy may be warranted, if clinically indicated. Status post median sternotomy for CABG including LIMA to the LAD. 6. Colonic  diverticulosis without evidence of acute diverticulitis at this time. 7. Additional incidental findings, as above. These results will be called to the ordering clinician or representative by the Radiologist Assistant, and communication documented in the PACS or zVision  Dashboard.   Electronically Signed By: Vinnie Langton M.D. On: 11/01/2016 15:34    STS Risk Score:  Risk of Mortality: 5.21%  Morbidity or Mortality: 20.24%  Long Length of Stay: 9.042%  Short Length of Stay: 18.961%  Permanent Stroke: 2.727%  Prolonged Ventilation: 15.083%  DSW Infection: 0.229%  Renal Failure: 4.431%  Reoperation: 8.363%    Impression:  This 83 year old woman has stage D severe symptomatic aortic stenosis with NYHA class III symptoms of fatigue and shortness of breath with minimal exertion. She has also had exertional chest pressure and dizziness. This is consistent with chronic diastolic heart failure. Her symptoms have progressed over the past 6 months and she is very limited in her activity at this time. I have personally reviewed her echo, cath, and CT studies. Her echo shows severe AS with a trileaflet aortic valve with severe leaflet calcification and a mean gradient of 54 mm Hg.  I agree that AVR is indicated in this patient who has remained independent. I don't think she is a candidate for open surgical AVR due to her age and frailty but TAVR is a reasonable alternative. Cardiac cath shows 3/4 patent bypass grafts and no significant areas of potential ischemia. Her cardiac CT shows anatomy favorable for a Sapien 3 valve. Her abdominal and pelvic CT shows severe aorto-iliac vascular disease but I think access may be adequate on the right for transfemoral insertion. The left side does not appear adequate for valve insertion but is adequate for diagnostic catheters. If right transfemoral access is not adequate then transapical insertion is probably the best option.   The patient and her family were counseled at length regarding treatment alternatives for management of severe symptomatic aortic stenosis. The risks and benefits of surgical intervention has been discussed in detail. Long-term prognosis with medical therapy was discussed. Alternative  approaches such as conventional surgical aortic valve replacement, transcatheter aortic valve replacement, and palliative medical therapy were compared and contrasted at length. This discussion was placed in the context of the patient's own specific clinical presentation and past medical history. All of their questions been addressed. The patient is eager to proceed with surgical management as soon as possible.  Following the decision to proceed with transcatheter aortic valve replacement, a discussion was held regarding what types of management strategies would be attempted intraoperatively in the event of life-threatening complications, including whether or not the patient would be considered a candidate for the use of cardiopulmonary bypass and/or conversion to open sternotomy for attempted surgical intervention.   The patient has been advised of a variety of complications that might develop including but not limited to risks of death, stroke, paravalvular leak, aortic dissection or other major vascular complications, aortic annulus rupture, device embolization, cardiac rupture or perforation, mitral regurgitation, acute myocardial infarction, arrhythmia, heart block or bradycardia requiring permanent pacemaker placement, congestive heart failure, respiratory failure, renal failure, pneumonia, infection, other late complications related to structural valve deterioration or migration, or other complications that might ultimately cause a temporary or permanent loss of functional independence or other long term morbidity. The patient provides full informed consent for the procedure as described and all questions were answered.    Plan:  Transfemoral TAVR. If transfemoral access is no adequate we will plan transapical  insertion. She will stop her Eliquis prior to dental extractions which will be done late in the week prior to TAVR and then we will proceed with TAVR on the following Tuesday. This will be  coordinated with Dr. Enrique Sack.  I spent 60 minutes performing this consultation and > 50% of this time was spent face to face counseling and coordinating the care of this patient's severe aortic stenosis.  Gaye Pollack, MD 12/07/2016      Electronically signed by Gaye Pollack, MD at 12/12/2016 8:20 PM

## 2016-12-28 NOTE — Anesthesia Procedure Notes (Signed)
Procedure Name: Intubation Date/Time: 12/28/2016 8:45 AM Performed by: Willeen Cass P Pre-anesthesia Checklist: Patient identified, Emergency Drugs available, Suction available and Patient being monitored Patient Re-evaluated:Patient Re-evaluated prior to induction Oxygen Delivery Method: Circle System Utilized Preoxygenation: Pre-oxygenation with 100% oxygen Induction Type: IV induction Ventilation: Mask ventilation without difficulty Laryngoscope Size: Mac and 3 Grade View: Grade I Nasal Tubes: Right and Nasal prep performed Tube size: 7.5 mm Number of attempts: 2 (d/t cuff leak -- easy airway) Placement Confirmation: ETT inserted through vocal cords under direct vision,  positive ETCO2 and breath sounds checked- equal and bilateral Secured at: 27 cm Tube secured with: Tape Dental Injury: Teeth and Oropharynx as per pre-operative assessment  Comments: B nares highly calcified, R-sided nasal tube advanced by Dr Oletta Lamas. Cuff leak noted and tube exchanged with Dr Deatra Canter.

## 2016-12-29 ENCOUNTER — Encounter (HOSPITAL_COMMUNITY): Payer: Self-pay | Admitting: Dentistry

## 2016-12-29 DIAGNOSIS — K91840 Postprocedural hemorrhage and hematoma of a digestive system organ or structure following a digestive system procedure: Secondary | ICD-10-CM | POA: Diagnosis not present

## 2016-12-29 DIAGNOSIS — K08199 Complete loss of teeth due to other specified cause, unspecified class: Secondary | ICD-10-CM

## 2016-12-29 DIAGNOSIS — K029 Dental caries, unspecified: Secondary | ICD-10-CM | POA: Diagnosis not present

## 2016-12-29 DIAGNOSIS — K045 Chronic apical periodontitis: Secondary | ICD-10-CM | POA: Diagnosis not present

## 2016-12-29 DIAGNOSIS — M278 Other specified diseases of jaws: Secondary | ICD-10-CM | POA: Diagnosis not present

## 2016-12-29 DIAGNOSIS — K083 Retained dental root: Secondary | ICD-10-CM | POA: Diagnosis not present

## 2016-12-29 DIAGNOSIS — K0889 Other specified disorders of teeth and supporting structures: Secondary | ICD-10-CM | POA: Diagnosis not present

## 2016-12-29 DIAGNOSIS — I350 Nonrheumatic aortic (valve) stenosis: Secondary | ICD-10-CM

## 2016-12-29 DIAGNOSIS — Z01818 Encounter for other preprocedural examination: Secondary | ICD-10-CM

## 2016-12-29 LAB — CBC
HCT: 33.8 % — ABNORMAL LOW (ref 36.0–46.0)
Hemoglobin: 10.8 g/dL — ABNORMAL LOW (ref 12.0–15.0)
MCH: 30 pg (ref 26.0–34.0)
MCHC: 32 g/dL (ref 30.0–36.0)
MCV: 93.9 fL (ref 78.0–100.0)
Platelets: 183 10*3/uL (ref 150–400)
RBC: 3.6 MIL/uL — ABNORMAL LOW (ref 3.87–5.11)
RDW: 14.7 % (ref 11.5–15.5)
WBC: 7.6 10*3/uL (ref 4.0–10.5)

## 2016-12-29 NOTE — Discharge Planning (Signed)
Patient discharged home in stable condition. Verbalizes understanding of all discharge instructions, including home medications and follow up appointments. 

## 2016-12-29 NOTE — Discharge Summary (Signed)
Discharge Summary    Patient ID: [name],  MRN: [id number], DOB/AGE: 08/24/1928 83 y.o.  Admit date: 12/28/2016 Discharge date: 12/29/2016   Primary Care Provider: [facility] Primary Cardiologist: Dr. Angelena Form  Discharge Diagnoses    Principal Problem:   Chronic apical periodontitis Active Problems:   Severe aortic stenosis   Retained dental roots   Dental caries   Bilateral maxillary lateral exostoses   Chronic periodontitis   Loose, teeth   Gingival bleeding   Allergies Allergies  Allergen Reactions  . Bactrim [Sulfamethoxazole-Trimethoprim] Other (See Comments)    "makes her feel funny" or "unsteady"   . Amitriptyline Hcl Rash  . Aspirin Swelling and Rash  . Zetia [Ezetimibe] Rash     History of Present Illness     [name] is a 83 y.o. female with a hx of severe AS who is being admitted for observation due to protracted bleeding after multiple dental extractions in anticipation of TAVR  [name] has advanced and symptomatic AS and history of CAD and previous CABG, atrial fibrillation and suspected LA thrombus, Eliquis stopped 4 days ago. [name] had extractions of teeth 2-5, 7-10, 12,13,17-20, and 22-29 on 12/28/16 and reduction of maxillary exostoses.  On admission, [name] denied angina and dyspnea and was communicating in writing, gauze in mouth.  Hospital Course     Consultants: Dentistry  20 teeth were removed by Dentistry yesterday. [name] tolerated the procedure well; however, [name] had protracted bleeding. [name] was monitored overnight for bleeding, which subsided.  [name] was instructed to continue holding her eliquis until after her TAVR procedure. [name] was instructed to use salt water rinses every 2 hrs and brush tongue daily.  Patient seen and examined by Dr. Sallyanne Kuster today and was stable for discharge. All follow up has been arranged.  _____________  Discharge Vitals Blood pressure (!) 144/60, pulse 74, temperature 98.2 F (36.8 C),  temperature source Oral, resp. rate 16, height 5\' 5"  (1.651 m), weight 128 lb (58.1 kg), SpO2 96 %.  Filed Weights   12/28/16 1440  Weight: 128 lb (58.1 kg)    Physical Exam  Constitutional: [name] is well-developed, well-nourished, and in no distress.  HENT:  Clinically edentulous  Cardiovascular: Normal rate and regular rhythm.   Murmur heard. Pulmonary/Chest: Effort normal and breath sounds normal.  Abdominal: Soft. Bowel sounds are normal.  Skin: Skin is warm and dry.  Psychiatric: Affect normal.     Labs & Radiologic Studies    CBC  Recent Labs  12/26/16 1423  WBC 6.4  HGB 11.3*  HCT 35.0*  MCV 93.8  PLT 476   Basic Metabolic Panel  Recent Labs  12/26/16 1423  NA 141  K 3.9  CL 108  CO2 22  GLUCOSE 111*  BUN 11  CREATININE 0.43*  CALCIUM 9.6   Liver Function Tests  Recent Labs  12/26/16 1423  AST 35  ALT 68*  ALKPHOS 78  BILITOT 0.7  PROT 7.0  ALBUMIN 4.2   No results for input(s): LIPASE, AMYLASE in the last 72 hours. Cardiac Enzymes No results for input(s): CKTOTAL, CKMB, CKMBINDEX, TROPONINI in the last 72 hours. BNP Invalid input(s): POCBNP D-Dimer No results for input(s): DDIMER in the last 72 hours. Hemoglobin A1C  Recent Labs  12/26/16 1424  HGBA1C 5.7*   Fasting Lipid Panel No results for input(s): CHOL, HDL, LDLCALC, TRIG, CHOLHDL, LDLDIRECT in the last 72 hours. Thyroid Function Tests No results for input(s): TSH, T4TOTAL, T3FREE, THYROIDAB  in the last 72 hours.  Invalid input(s): FREET3 _____________  Dg Chest 2 View  Result Date: 12/26/2016 CLINICAL DATA:  Short of breath and weakness EXAM: CHEST  2 VIEW COMPARISON:  11/30/2015 FINDINGS: The heart is normal in size. Postop changes from sternotomy and CABG. Lungs are hyperaerated. Calcified granuloma at the right lung base. Mild patchy densities at the left base are worrisome for airspace disease. No pneumothorax. No pleural effusion. Dextroscoliosis in the midthoracic spine.  IMPRESSION: Patchy root left basilar airspace disease. Electronically Signed   By: Marybelle Killings M.D.   On: 12/26/2016 16:03     Diagnostic Studies/Procedures    Right and left heart cath 10/27/16  Ost Cx to Prox Cx lesion, 50 %stenosed.  Ost 2nd Mrg to 2nd Mrg lesion, 10 %stenosed.  SVG graft was visualized by angiography and is normal in caliber.  Mid RCA to Dist RCA lesion, 100 %stenosed.  SVG graft was visualized by angiography and is normal in caliber.  SVG graft was visualized by angiography and is normal in caliber.  Mid LAD lesion, 100 %stenosed.  1st Diag lesion, 50 %stenosed.  LIMA graft was visualized by non-selective angiography and is small.  Dist LAD lesion, 40 %stenosed.   1. Severe triple vessel CAD s/p 4V CABG with 3/4 patent bypass grafts 2. The mid LAD is chronically occluded. The vein graft to the mid LAD is patent 3. The proximal Circumflex has moderate stenosis. The mid Circumflex stent is patent. The vein graft to the second OM is patent 4. The mid RCA is chronically occluded. The vein graft to the distal RCA is patent 5. The LIMA to the Diagonal is atretic. The Diagonal branch as moderate non-obstructive disease.  6. Severe aortic stenosis (mean gradient 41.5 mmHg, peak to peak gradient 41 mmHg, AVA 0.76cm2).   Recommendations: Will continue planning for TAVR. [name] will be referred to see the CT surgeons on our TAVR team and will need to have her pre op testing arranged    Disposition   Pt is being discharged home today in good condition.  Follow-up Plans & Appointments    Follow-up Information    Call Lenn Cal, DDS.   Specialty:  Dentistry Why:  For suture removal, For wound re-check once discharged after admission for heart valve surgery Contact information: [place] [id number] [phone]          Discharge Instructions    Consult to dietitian    Complete by:  As directed    Assessment of nutritional  status and add  nutritional supplements as needed   Patient is edentulous.   Diet - low sodium heart healthy    Complete by:  As directed    Gauze    Complete by:  As directed    4 x 4 gauze to oral bleeding sites until oozing stops.   Increase activity slowly    Complete by:  As directed    Nursing communication    Complete by:  As directed    Patient is to continue Amicar 5% rinses postoperatively. Patient is to rinse with 10 ML's every hour for 10 hours in a swish and spit manner.      Discharge Medications   Current Discharge Medication List    START taking these medications   Details  HYDROcodone-acetaminophen (HYCET) 7.5-325 mg/15 ml solution Take 10-15 ML's by mouth every 6 hours as needed for moderate to severe pain. Qty: 180 mL, Refills: 0  CONTINUE these medications which have NOT CHANGED   Details  acetaminophen (TYLENOL) 650 MG CR tablet Take 1,300 mg by mouth 2 (two) times daily as needed for pain.     calcium-vitamin D (OSCAL WITH D) 500-200 MG-UNIT per tablet Take 1 tablet by mouth daily with breakfast.    cholecalciferol (VITAMIN D) 1000 UNITS tablet Take 1,000 Units by mouth daily.    diltiazem (CARDIZEM CD) 180 MG 24 hr capsule TAKE 1 CAPSULE BY MOUTH DAILY Qty: 30 capsule, Refills: 11    FLOVENT HFA 110 MCG/ACT inhaler inhale 1 puff by mouth twice a day Qty: 12 g, Refills: 1    fluticasone (FLONASE) 50 MCG/ACT nasal spray instill 2 sprays into each nostril once daily Qty: 16 g, Refills: 3    guaiFENesin (MUCINEX) 600 MG 12 hr tablet Take 1 tablet (600 mg total) by mouth 2 (two) times daily. Qty: 30 tablet, Refills: 0    guaiFENesin (ROBITUSSIN) 100 MG/5ML liquid Take 100 mg by mouth 3 (three) times daily as needed for cough. Reported on 12/08/2015    isosorbide dinitrate (ISORDIL) 30 MG tablet TAKE 1 TABLET BY MOUTH 2 TIMES DAILY Qty: 60 tablet, Refills: 7    loratadine (CLARITIN) 10 MG tablet Take 10 mg by mouth daily.      metoprolol  tartrate (LOPRESSOR) 25 MG tablet take 1/2 tablet by mouth twice a day Qty: 60 tablet, Refills: 11    nitroGLYCERIN (NITROSTAT) 0.4 MG SL tablet place 1 tablet UNDER THE TONGUE EVERY 5 MINUTES if needed for chest pain MAX OF 3 TABS Qty: 25 tablet, Refills: 6    pantoprazole (PROTONIX) 40 MG tablet Take 1 tablet (40 mg total) by mouth 2 (two) times daily. Qty: 180 tablet, Refills: 3    rosuvastatin (CRESTOR) 40 MG tablet take 1 tablet by mouth once daily Qty: 90 tablet, Refills: 1    valsartan (DIOVAN) 40 MG tablet TAKE 1 TABLET BY MOUTH ONCE DAILY Qty: 90 tablet, Refills: 3    VENTOLIN HFA 108 (90 Base) MCG/ACT inhaler INHALE 2 PUFFS BY MOUTH EVERY 6 HOURS AS NEEDED FOR WHEEZING OR SHORTNESS OF BREATH Qty: 18 Inhaler, Refills: 5      STOP taking these medications     apixaban (ELIQUIS) 2.5 MG TABS tablet      traMADol (ULTRAM) 50 MG tablet           Outstanding Labs/Studies   TAVR scheduled for next week  Duration of Discharge Encounter   Greater than 30 minutes including physician time.  Signed, Tami Lin Duke PA-C 12/29/2016, 8:44 AM

## 2016-12-29 NOTE — Progress Notes (Addendum)
POST OPERATIVE NOTE: Patient admitted for observation by cardiology team. Postop day 1.  12/29/2016 [name] [id number]  VITALS: BP (!) 144/60 (BP Location: Right Arm)   Pulse 74   Temp 98.2 F (36.8 C) (Oral)   Resp 16   Ht 5\' 5"  (1.651 m)   Wt 128 lb (58.1 kg)   SpO2 96%   BMI 21.30 kg/m   LABS:  Lab Results  Component Value Date   WBC 6.4 12/26/2016   HGB 11.3 (L) 12/26/2016   HCT 35.0 (L) 12/26/2016   MCV 93.8 12/26/2016   PLT 162 12/26/2016   BMET    Component Value Date/Time   NA 141 12/26/2016 1423   NA 143 10/24/2016 1240   K 3.9 12/26/2016 1423   CL 108 12/26/2016 1423   CO2 22 12/26/2016 1423   GLUCOSE 111 (H) 12/26/2016 1423   BUN 11 12/26/2016 1423   BUN 13 10/24/2016 1240   CREATININE 0.43 (L) 12/26/2016 1423   CREATININE 0.41 (L) 08/21/2015 0001   CALCIUM 9.6 12/26/2016 1423   GFRNONAA >60 12/26/2016 1423   GFRAA >60 12/26/2016 1423    Lab Results  Component Value Date   INR 1.07 12/26/2016   INR 1.0 10/24/2016   INR 1.17 05/20/2014   No results found for: PTT   [name] is status post multiple dental extractions with alveoloplasty and pre-prosthetic surgery as needed in the operating room with general anesthesia.  SUBJECTIVE: Patient with some discomfort from dental extraction sites. The patient denies having any active bleeding. The gauze was removed from the minimal heme noted.  EXAM: There is no sign of infection, heme, or ooze. Sutures are intact. Patient will heal in by primary closure. There is xerostomia noted. Intraoral and extraoral bruising is noted. The patient is now clinically edentulous.  ASSESSMENT: Post operative course is consistent with dental procedures performed in the operating room with general anesthesia. Patient is clinically edentulous but does have impacted tooth #6 and 11 remaining.   PLAN: 1. Use salt water rinses every 2 hours while awake to aid healing. 2. Brush tongue daily. 3. Advance diet as  tolerated 4. Patient is okay for discharge from a dental standpoint.  Patient is cleared for heart valve surgery this coming Tuesday if no postop complications occur. 5. Use pain medication as prescribed. 6. We will discontinue IV Ancef antibiotic therapy and no oral antibiotic therapy is required at this time. 7. Patient is to call if problems arise.   Lenn Cal, DDS

## 2016-12-29 NOTE — Care Management Obs Status (Signed)
[place] NOTIFICATION   Patient Details  Name: [name] MRN: [id number] Date of Birth: 17-Dec-1928   Medicare Observation Status Notification Given:       Marilu Favre, RN 12/29/2016, 8:34 AM

## 2017-01-02 MED ORDER — NITROGLYCERIN IN D5W 200-5 MCG/ML-% IV SOLN
2.0000 ug/min | INTRAVENOUS | Status: DC
  Filled 2017-01-02: qty 250

## 2017-01-02 MED ORDER — NOREPINEPHRINE BITARTRATE 1 MG/ML IV SOLN
0.0000 ug/min | INTRAVENOUS | Status: AC
  Administered 2017-01-03: 1 ug/min via INTRAVENOUS
  Filled 2017-01-02: qty 4

## 2017-01-02 MED ORDER — POTASSIUM CHLORIDE 2 MEQ/ML IV SOLN
80.0000 meq | INTRAVENOUS | Status: DC
Start: 2017-01-03 — End: 2017-01-03
  Filled 2017-01-02: qty 40

## 2017-01-02 MED ORDER — VANCOMYCIN HCL 10 G IV SOLR
1250.0000 mg | INTRAVENOUS | Status: AC
  Administered 2017-01-03: 1250 mg via INTRAVENOUS
  Filled 2017-01-02: qty 1250

## 2017-01-02 MED ORDER — EPINEPHRINE PF 1 MG/ML IJ SOLN
0.0000 ug/min | INTRAVENOUS | Status: DC
  Filled 2017-01-02: qty 4

## 2017-01-02 MED ORDER — MAGNESIUM SULFATE 50 % IJ SOLN
40.0000 meq | INTRAMUSCULAR | Status: DC
  Filled 2017-01-02: qty 10

## 2017-01-02 MED ORDER — SODIUM CHLORIDE 0.9 % IV SOLN
30.0000 ug/min | INTRAVENOUS | Status: DC
  Filled 2017-01-02: qty 2

## 2017-01-02 MED ORDER — SODIUM CHLORIDE 0.9 % IV SOLN
INTRAVENOUS | Status: DC
  Filled 2017-01-02: qty 30

## 2017-01-02 MED ORDER — DOPAMINE-DEXTROSE 3.2-5 MG/ML-% IV SOLN
0.0000 ug/kg/min | INTRAVENOUS | Status: DC
  Filled 2017-01-02: qty 250

## 2017-01-02 MED ORDER — INSULIN REGULAR HUMAN 100 UNIT/ML IJ SOLN
INTRAMUSCULAR | Status: DC
  Filled 2017-01-02: qty 1

## 2017-01-02 MED ORDER — DEXMEDETOMIDINE HCL IN NACL 400 MCG/100ML IV SOLN
0.1000 ug/kg/h | INTRAVENOUS | Status: AC
  Administered 2017-01-03: 1 ug/kg/h via INTRAVENOUS
  Filled 2017-01-02: qty 100

## 2017-01-02 MED ORDER — DEXTROSE 5 % IV SOLN
1.5000 g | INTRAVENOUS | Status: AC
  Administered 2017-01-03: 1.5 g via INTRAVENOUS
  Filled 2017-01-02 (×2): qty 1.5

## 2017-01-02 NOTE — H&P (Signed)
[address]       ,[place] [address]             [phone]      Cardiothoracic Surgery Admission History and Physical   Referring Provider is Burnell Blanks* PCP is Denita Lung, MD      Chief Complaint  Patient presents with  . Aortic Stenosis        HPI:  The patient is an 83 year old woman with hypertension, hyperlipidemia, atrial fibrillation and CAD s/p CABG and MAZE in 2006 by Dr. Prescott Gum. She was found to have severe AS by cho in 11/2015 with a mean gradient of 47 mm Hg. She did not want to pursue further evaluation at that time. She has had atrial fibrillation bu refused anticoagulation due to hematuria on Eliquis in 2015. Her most recent echo on 10/14/2016 showed progression of her AS with an increase in the mean gradient to 54 mm Hg with a peak of 101. The LVEF was 60-65% with grade 2 diastolic dysfunction. Cardiac cath on 10/27/2016 showed severe native 3 vessel CAD with 3/4 patent bypass grafts. The mean AV gradient was 41.5 mm Hg with a peak to peak of 41 mm Hg. She is not very active due to difficulty with balance requiring a walker but reports exertional dyspnea with minimal activity. She has had some chest pain with exertion and dizziness with blurred vision. She feels like this has worsened over the past 6 months.  A filling defect was seen at the tip of the left atrial appendage on CTA of the chest concerning for possible left atrial thrombus. After discussion with the team she was started on Eliquis for a month preop.  She has been evaluated by Dr. Enrique Sack and underwent multiple extractions on 12/28/2016     Past Medical History:  Diagnosis Date  . Arthritis    OSTEO  . Aspirin allergy    on Plavix  . Asthma   . Bronchitis   . CAD (coronary artery disease)    a. s/p CABG 2006 with Cox-Maze procedure.  . Carotid artery disease ([place])    a. s/p R CEA.  . Chronic stable angina ([place])   . Diastolic  dysfunction   . Eczema   . Fibromyalgia   . GERD (gastroesophageal reflux disease)   . Heart murmur   . Hiatal hernia   . Hyperlipidemia   . Hypertension   . Kyphoscoliosis   . Mitral regurgitation   . Myocardial infarction [facility]) 2003   Stent to CFX  . PAF (paroxysmal atrial fibrillation) ([place])    a. pt has h/o hematuria on Eliquis and has since refused anticoagulation  . Pneumonia 2015 ?  Marland Kitchen Pulmonary regurgitation   . Severe aortic stenosis   . Skin cancer of arm   . Stroke ([place])   . Tricuspid regurgitation     Past Surgical History:  Procedure Laterality Date  . ABDOMINAL HYSTERECTOMY  1976  . CARDIAC SURGERY  stents  . CAROTID ENDARTERECTOMY  2010  . CORONARY ARTERY BYPASS GRAFT  01/2005   LIMA-D1; SVG-LAD; SVG-OM; SVG-PDA  . RIGHT/LEFT HEART CATH AND CORONARY/GRAFT ANGIOGRAPHY N/A 10/27/2016   Procedure: Right/Left Heart Cath and Coronary/Graft Angiography;  Surgeon: Burnell Blanks, MD;  Location: [facility];  Service: Cardiovascular;  Laterality: N/A;  . TONSILLECTOMY    . TUMOR REMOVAL           Family History  Problem  Relation Age of Onset  . Heart failure Mother   . Other Father     Social History        Social History  . Marital status: Widowed    Spouse name: N/A  . Number of children: 3  . Years of education: N/A       Occupational History  .  Retired        Social History Main Topics  . Smoking status: Never Smoker  . Smokeless tobacco: Former Systems developer    Types: Snuff    Quit date: 05/23/2001  . Alcohol use No  . Drug use: No  . Sexual activity: Not Currently       Other Topics Concern  . Not on file      Social History Narrative  . No narrative on file          Current Outpatient Prescriptions  Medication Sig Dispense Refill  . acetaminophen (TYLENOL) 650 MG CR tablet Take 650-1,300 mg by mouth 2 (two) times daily as needed for pain.    Marland Kitchen apixaban (ELIQUIS) 2.5 MG  TABS tablet Take 1 tablet (2.5 mg total) by mouth 2 (two) times daily. 60 tablet 2  . calcium-vitamin D (OSCAL WITH D) 500-200 MG-UNIT per tablet Take 1 tablet by mouth daily with breakfast.    . cholecalciferol (VITAMIN D) 1000 UNITS tablet Take 1,000 Units by mouth daily.    . diazepam (VALIUM) 2 MG tablet Take 2 mg by mouth every 6 (six) hours as needed (dizziness). Reported on 12/08/2015    . diltiazem (CARDIZEM CD) 180 MG 24 hr capsule TAKE 1 CAPSULE BY MOUTH DAILY 30 capsule 11  . FLOVENT HFA 110 MCG/ACT inhaler inhale 1 puff by mouth twice a day 12 g 1  . fluticasone (FLONASE) 50 MCG/ACT nasal spray instill 2 sprays into each nostril once daily 16 g 3  . guaiFENesin (MUCINEX) 600 MG 12 hr tablet Take 1 tablet (600 mg total) by mouth 2 (two) times daily. 30 tablet 0  . guaiFENesin (ROBITUSSIN) 100 MG/5ML liquid Take 100 mg by mouth 3 (three) times daily as needed for cough. Reported on 12/08/2015    . isosorbide dinitrate (ISORDIL) 30 MG tablet TAKE 1 TABLET BY MOUTH 2 TIMES DAILY 60 tablet 7  . loratadine (CLARITIN) 10 MG tablet Take 10 mg by mouth daily.      . metoprolol tartrate (LOPRESSOR) 25 MG tablet take 1/2 tablet by mouth twice a day 60 tablet 11  . nitroGLYCERIN (NITROSTAT) 0.4 MG SL tablet place 1 tablet UNDER THE TONGUE EVERY 5 MINUTES if needed for chest pain MAX OF 3 TABS (Patient taking differently: Place 0.4 mg under the tongue every 5 (five) minutes as needed for chest pain. place 1 tablet UNDER THE TONGUE EVERY 5 MINUTES if needed for chest pain MAX OF 3 TABS) 25 tablet 6  . pantoprazole (PROTONIX) 40 MG tablet Take 1 tablet (40 mg total) by mouth 2 (two) times daily. 180 tablet 3  . rosuvastatin (CRESTOR) 40 MG tablet take 1 tablet by mouth once daily (Patient taking differently: take 1 tablet by mouth once daily in the evening) 90 tablet 1  . traMADol (ULTRAM) 50 MG tablet take 2 tablets by mouth three times a day if needed for pain (Patient taking differently: Take  100 mg by mouth 3 (three) times daily as needed (for pain (typically in the evening)). take 2 tablets by mouth three times a day if needed for pain) 60 tablet  3  . valsartan (DIOVAN) 40 MG tablet TAKE 1 TABLET BY MOUTH ONCE DAILY 90 tablet 3  . VENTOLIN HFA 108 (90 Base) MCG/ACT inhaler INHALE 2 PUFFS BY MOUTH EVERY 6 HOURS AS NEEDED FOR WHEEZING OR SHORTNESS OF BREATH 18 Inhaler 5   No current facility-administered medications for this visit.          Allergies  Allergen Reactions  . Bactrim [Sulfamethoxazole-Trimethoprim] Other (See Comments)    "makes her feel funny" or "unsteady"   . Amitriptyline Hcl Rash  . Aspirin Swelling and Rash  . Zetia [Ezetimibe] Rash      Review of Systems:              General:                      normal appetite, reduced energy, no weight gain, no weight loss, no fever             Cardiac:                       has chest pain with exertion, no chest pain at rest, has SOB with minimal exertion, no resting SOB, no PND, no orthopnea, has palpitations, has arrhythmia, has atrial fibrillation, no LE edema, has dizzy spells, no syncope             Respiratory:                 exertional shortness of breath, no home oxygen, no productive cough, no dry cough, no bronchitis, no wheezing, no hemoptysis, no asthma, no pain with inspiration or cough, no sleep apnea, no CPAP at night             GI:                               no difficulty swallowing, has reflux, no frequent heartburn, has hiatal hernia, no abdominal pain, no constipation, no diarrhea, no hematochezia, no hematemesis, no melena             GU:                              no dysuria,  no frequency, no urinary tract infection, no hematuria, no kidney stones, no kidney disease             Vascular:                     no pain suggestive of claudication, no pain in feet, no leg cramps, no varicose veins, no DVT, no non-healing foot ulcer             Neuro:                         prior  stroke, no TIA's, no seizures, no headaches, no temporary blindness one eye,  no slurred speech, no peripheral neuropathy, no chronic pain, has instability of gait, no memory/cognitive dysfunction             Musculoskeletal:         has arthritis, no joint swelling, has myalgias, has difficulty walking, reduced mobility              Skin:  no rash, no itching, no skin infections, no pressure sores or ulcerations, skin cancer             Psych:                         no anxiety, no depression, no nervousness, no unusual recent stress             Eyes:                           no blurry vision, no floaters, no recent vision changes,  wears glasses              ENT:                            has hearing loss, has loose or painful teeth, no dentures, saw Dr. Enrique Sack recently and requires extractions.             Hematologic:               no easy bruising, no abnormal bleeding, no clotting disorder, no frequent epistaxis             Endocrine:                   no diabetes, does not check CBG's at home                             Physical Exam:              BP 135/72 (BP Location: Left Arm, Patient Position: Sitting, Cuff Size: Normal)   Pulse 60   Resp 16   Ht 5\' 5"  (1.651 m)   Wt 128 lb (58.1 kg)   SpO2 97% Comment: RA  BMI 21.30 kg/m              General:                      Elderly and frail but  well-appearing, in a wheel chair             HEENT:                       Unremarkable, NCAT, PERLA, EOMI, oropharynx clear. Teeth in poor condition             Neck:                           no JVD, no bruits, no adenopathy or thyromegaly             Chest:                          clear to auscultation, symmetrical breath sounds, no wheezes, no rhonchi              CV:                              RRR, grade III/VI crescendo/decrescendo murmur heard best at RSB,  no diastolic murmur             Abdomen:  soft, non-tender, no masses or  organomegaly             Extremities:                 warm, well-perfused, pulses palpable, no LE edema             Rectal/GU                   Deferred             Neuro:                         Grossly non-focal and symmetrical throughout             Skin:                            Clean and dry, no rashes, no breakdown   Diagnostic Tests:  [name] [address], [place] [address] [phone]  ------------------------------------------------------------------- Transthoracic Echocardiography  Patient: Kapri, Nero MR #: [id number] Study Date: 10/14/2016 Gender: F Age: 46 Height: 165.1 cm Weight: 59 kg BSA: 1.65 m^2 Pt. Status: Room:  ATTENDING Loralie Champagne, M.D. SONOGRAPHER Wyatt Mage, RDCS ORDERING [place], Christopher REFERRING [place], Christopher PERFORMING [facility], Outpatient  cc:  ------------------------------------------------------------------- LV EF: 60% - 65%  ------------------------------------------------------------------- Indications: Aortic stenosis (I35).  ------------------------------------------------------------------- History: PMH: Dyspnea. Atrial fibrillation. Coronary artery disease. Aortic valve disease. Risk factors: Edema. Carotid stenosis. Dyslipidemia.  ------------------------------------------------------------------- Study Conclusions  - Left ventricle: The cavity size was normal. Wall thickness was increased in a pattern of mild LVH. Systolic function was normal. The estimated ejection fraction was in the range of 60% to 65%. Wall motion was normal; there were no regional wall motion abnormalities. Features are consistent with a pseudonormal left ventricular filling pattern, with concomitant abnormal relaxation and increased filling  pressure (grade 2 diastolic dysfunction). - Aortic valve: Trileaflet; severely calcified leaflets. There was severe stenosis. Mean gradient (S): 54 mm Hg. Peak gradient (S): 101 mm Hg. Valve area (VTI): 0.45 cm^2. - Mitral valve: Mildly to moderately calcified annulus. Moderately calcified leaflets . There was mild regurgitation. - Left atrium: The atrium was mildly dilated. - Right ventricle: The cavity size was normal. Systolic function was normal. - Tricuspid valve: Peak RV-RA gradient (S): 36 mm Hg. - Pulmonary arteries: PA peak pressure: 44 mm Hg (S). - Systemic veins: IVC measured 1.7 cm with < 50% respirophasic variation, suggesting RA pressure 8 mmHg.  Impressions:  - Normal LV size with mild LV hypertrophy. EF 60-65%. Moderate diastolic dysfunction. Severe aortic stenosis. Normal RV size and systolic function. Mild MR. Mild pulmonary hypertension.  ------------------------------------------------------------------- Labs, prior tests, procedures, and surgery: Transthoracic echocardiography (12/03/2015). The aortic valve showed severe stenosis. EF was 60% and PA pressure was 45 (systolic). Aortic valve: peak gradient of 80 mm Hg and mean gradient of 47 mm Hg.  Coronary artery bypass grafting.  ------------------------------------------------------------------- Study data: Comparison was made to the study of 12/20/2015. Study status: Routine. Procedure: The patient reported no pain pre or post test. Transthoracic echocardiography. Image quality was adequate. The study was technically difficult, as a result of poor patient compliance. Study completion: There were no complications. Transthoracic echocardiography. M-mode, complete 2D, spectral Doppler, and color Doppler. Birthdate: Patient birthdate: 04-21-1929. Age: Patient is 83 yr old. Sex: Gender: female. BMI: 21.6 kg/m^2. Blood pressure: 118/70 Patient status:  Outpatient. Study date: Study date:  10/14/2016. Study time: 01:01 PM. Location:  [facility]  -------------------------------------------------------------------  ------------------------------------------------------------------- Left ventricle: The cavity size was normal. Wall thickness was increased in a pattern of mild LVH. Systolic function was normal. The estimated ejection fraction was in the range of 60% to 65%. Wall motion was normal; there were no regional wall motion abnormalities. Features are consistent with a pseudonormal left ventricular filling pattern, with concomitant abnormal relaxation and increased filling pressure (grade 2 diastolic dysfunction).  ------------------------------------------------------------------- Aortic valve: Trileaflet; severely calcified leaflets. Doppler: There was severe stenosis. There was no regurgitation. VTI ratio of LVOT to aortic valve: 0.14. Valve area (VTI): 0.45 cm^2. Indexed valve area (VTI): 0.27 cm^2/m^2. Peak velocity ratio of LVOT to aortic valve: 0.14. Valve area (Vmax): 0.44 cm^2. Indexed valve area (Vmax): 0.27 cm^2/m^2. Mean velocity ratio of LVOT to aortic valve: 0.14. Valve area (Vmean): 0.44 cm^2. Indexed valve area (Vmean): 0.27 cm^2/m^2. Mean gradient (S): 54 mm Hg. Peak gradient (S): 101 mm Hg.  ------------------------------------------------------------------- Aorta: Aortic root: The aortic root was normal in size. Ascending aorta: The ascending aorta was normal in size.  ------------------------------------------------------------------- Mitral valve: Mildly to moderately calcified annulus. Moderately calcified leaflets . Doppler: There was no evidence for stenosis. There was mild regurgitation. Valve area by pressure half-time: 3.38 cm^2. Indexed valve area by pressure half-time: 2.05 cm^2/m^2. Peak gradient (D): 9 mm  Hg.  ------------------------------------------------------------------- Left atrium: The atrium was mildly dilated.  ------------------------------------------------------------------- Right ventricle: The cavity size was normal. Systolic function was normal.  ------------------------------------------------------------------- Pulmonic valve: Structurally normal valve. Cusp separation was normal. Doppler: Transvalvular velocity was within the normal range. There was trivial regurgitation.  ------------------------------------------------------------------- Tricuspid valve: Doppler: There was trivial regurgitation.  ------------------------------------------------------------------- Right atrium: The atrium was normal in size.  ------------------------------------------------------------------- Pericardium: There was no pericardial effusion.  ------------------------------------------------------------------- Systemic veins: IVC measured 1.7 cm with <50% respirophasic variation, suggesting RA pressure 8 mmHg.  ------------------------------------------------------------------- Measurements  Left ventricle Value Reference LV ID, ED, PLAX chordal (L) 37 mm 43 - 52 LV ID, ES, PLAX chordal 28 mm 23 - 38 LV fx shortening, PLAX chordal (L) 24 % >=29 LV PW thickness, ED 9 mm --------- IVS/LV PW ratio, ED 1.22 <=1.3 Stroke volume, 2D 63 ml --------- Stroke volume/bsa, 2D 38 ml/m^2 --------- LV e&', lateral 6.25 cm/s --------- LV E/e&', lateral 24 --------- LV e&', medial 5.7 cm/s --------- LV E/e&', medial  26.32 --------- LV e&', average 5.98 cm/s --------- LV E/e&', average 25.1 ---------  Ventricular septum Value Reference IVS thickness, ED 11 mm ---------  LVOT Value Reference LVOT ID, S 20 mm --------- LVOT area 3.14 cm^2 --------- LVOT peak velocity, S 71.1 cm/s --------- LVOT mean velocity, S 48.2 cm/s --------- LVOT VTI, S 20 cm ---------  Aortic valve Value Reference Aortic valve peak velocity, S 502 cm/s --------- Aortic valve mean velocity, S 341 cm/s --------- Aortic valve VTI, S 139 cm --------- Aortic mean gradient, S 54 mm Hg --------- Aortic peak gradient, S 101 mm Hg --------- VTI ratio, LVOT/AV 0.14 --------- Aortic valve area, VTI 0.45 cm^2 --------- Aortic valve area/bsa, VTI 0.27 cm^2/m^2 --------- Velocity ratio, peak, LVOT/AV 0.14 --------- Aortic valve area, peak velocity 0.44 cm^2 --------- Aortic valve area/bsa, peak 0.27 cm^2/m^2 --------- velocity Velocity ratio, mean, LVOT/AV 0.14 --------- Aortic valve area, mean velocity 0.44 cm^2 --------- Aortic valve area/bsa, mean 0.27 cm^2/m^2 --------- velocity  Aorta Value Reference Aortic root ID, ED 30 mm  ---------  Left atrium Value Reference LA ID, A-P, ES 32 mm --------- LA ID/bsa,  A-P 1.94 cm/m^2 <=2.2 LA volume, ES, 1-p A4C 50.1 ml --------- LA volume/bsa, ES, 1-p A4C 30.4 ml/m^2 ---------  Mitral valve Value Reference Mitral E-wave peak velocity 150 cm/s --------- Mitral A-wave peak velocity 41.9 cm/s --------- Mitral deceleration time 222 ms 150 - 230 Mitral pressure half-time 65 ms --------- Mitral peak gradient, D 9 mm Hg --------- Mitral E/A ratio, peak 3.6 --------- Mitral valve area, PHT, DP 3.38 cm^2 --------- Mitral valve area/bsa, PHT, DP 2.05 cm^2/m^2 ---------  Pulmonary arteries Value Reference PA pressure, S, DP (H) 44 mm Hg <=30  Tricuspid valve Value Reference Tricuspid regurg peak velocity 300 cm/s --------- Tricuspid peak RV-RA gradient 36 mm Hg ---------  Systemic veins Value Reference Estimated CVP 8 mm Hg ---------  Pulmonic valve Value Reference Pulmonic regurg velocity, ED 92 cm/s ---------  Legend: (L) and (H) mark values outside specified reference range.  ------------------------------------------------------------------- Prepared and Electronically Authenticated by  Loralie Champagne, M.D. 2018-05-25T17:56:22  Physicians   Panel Physicians Referring Physician Case Authorizing Physician  Burnell Blanks, MD (Primary)    Procedures   Right/Left Heart Cath and Coronary/Graft Angiography  Conclusion     Ost Cx to Prox Cx lesion, 50 %stenosed.  Ost 2nd Mrg to 2nd Mrg lesion, 10 %stenosed.  SVG graft was visualized by angiography and is normal in caliber.  Mid RCA to Dist RCA lesion, 100 %stenosed.  SVG graft was visualized by angiography and is normal in caliber.  SVG graft was visualized by angiography and is normal in caliber.  Mid LAD lesion, 100 %stenosed.  1st Diag lesion, 50 %stenosed.  LIMA graft was visualized by non-selective angiography and is small.  Dist LAD lesion, 40 %stenosed.  1. Severe triple vessel CAD s/p 4V CABG with 3/4 patent bypass grafts 2. The mid LAD is chronically occluded. The vein graft to the mid LAD is patent 3. The proximal Circumflex has moderate stenosis. The mid Circumflex stent is patent. The vein graft to the second OM is patent 4. The mid RCA is chronically occluded. The vein graft to the distal RCA is patent 5. The LIMA to the Diagonal is atretic. The Diagonal branch as moderate non-obstructive disease.  6. Severe aortic stenosis (mean gradient 41.5 mmHg, peak to peak gradient 41 mmHg, AVA 0.76cm2).   Recommendations: Will continue planning for TAVR. She will be referred to see the CT surgeons on our TAVR team and will need to have her pre op testing arranged    Indications   Severe aortic stenosis [I35.0 (ICD-10-CM)]  Severe aortic valve stenosis [I35.0 (ICD-10-CM)]  Coronary artery disease of native artery of native heart with stable angina pectoris (HCC) [F79.024 (ICD-10-CM)]  Procedural Details/Technique   Technical Details Indication: 83 yo female with history of severe AS, mild to moderate MR, CAD s/p CABG in 2006 with recent worsened dyspnea and chest pain.   Procedure: The risks, benefits, complications, treatment options, and expected outcomes were discussed with the patient. The patient and/or family  concurred with the proposed plan, giving informed consent. The patient was brought to the cath lab after IV hydration was begun and oral premedication was given. The patient was further sedated with Versed and Fentanyl. The right groin was prepped and draped in the usual manner. Using the modified Seldinger access technique, a 5 French sheath was placed in the right femoral artery. A 7 French sheath was placed in the right femoral vein. Right heart cath performed with a balloon tipped catheter. Standard diagnostic catheters were used to  perform selective coronary angiography. I crossed the aortic valve with an AL-1 catheter and a straight wire.  There were no immediate complications. The patient was taken to the recovery area in stable condition.    Estimated blood loss <50 mL.  During this procedure the patient was administered the following to achieve and maintain moderate conscious sedation: Versed 1 mg, Fentanyl 25 mcg, while the patient's heart rate, blood pressure, and oxygen saturation were continuously monitored. The period of conscious sedation was 32 minutes, of which I was present face-to-face 100% of this time.    Complications   Complications documented before study signed (10/27/2016 1:04 PM EDT)    RIGHT/LEFT HEART CATH AND CORONARY/GRAFT ANGIOGRAPHY   None Documented by Burnell Blanks, MD 10/27/2016 1:03 PM EDT  Time Range: Intra-procedure      Coronary Findings   Dominance: Right  Left Anterior Descending  Mid LAD lesion, 100% stenosed. The lesion is chronically occluded.  Dist LAD lesion, 40% stenosed.  First Diagonal Branch  Vessel is moderate in size.  1st Diag lesion, 50% stenosed.  First Septal Branch  Vessel is small in size.  Second Diagonal Branch  Vessel is small in size.  Second Septal Branch  Vessel is small in size.  Third Diagonal Branch  Vessel is small in size.  Third Septal Branch  Vessel is small in size.  Left Circumflex  Ost Cx to  Prox Cx lesion, 50% stenosed.  First Obtuse Marginal Branch  Vessel is small in size.  Second Obtuse Marginal Branch  Vessel is moderate in size.  Ost 2nd Mrg to 2nd Mrg lesion, 10% stenosed. The lesion was previously treated using a stent (unknown type) over 2 years ago.  Third Obtuse Marginal Branch  Vessel is small in size.  Right Coronary Artery  Mid RCA to Dist RCA lesion, 100% stenosed. The lesion is chronically occluded.  Graft Angiography  saphenous Graft to 2nd Mrg  SVG graft was visualized by angiography and is normal in caliber.  saphenous Graft to RPDA  SVG graft was visualized by angiography and is normal in caliber.  saphenous Graft to Mid LAD  SVG graft was visualized by angiography and is normal in caliber.  Free LIMA Graft to 1st Diag  LIMA graft was visualized by non-selective angiography and is small.  Right Heart   Right Heart Pressures Elevated LV EDP consistent with volume overload.    Coronary Diagrams   Diagnostic Diagram       Implants        No implant documentation for this case.  PACS Images   Show images for Cardiac catheterization   Link to Procedure Log   Procedure Log    Hemo Data    Most Recent Value  Fick Cardiac Output 5.13 L/min  Fick Cardiac Output Index 3.15 (L/min)/BSA  Aortic Mean Gradient 41.5 mmHg  Aortic Peak Gradient 41 mmHg  Aortic Valve Area 0.76  Aortic Value Area Index 0.46 cm2/BSA  RA A Wave 7 mmHg  RA V Wave 7 mmHg  RA Mean 6 mmHg  RV Systolic Pressure 33 mmHg  RV Diastolic Pressure 2 mmHg  RV EDP 7 mmHg  PA Systolic Pressure 31 mmHg  PA Diastolic Pressure 13 mmHg  PA Mean 21 mmHg  PW A Wave 13 mmHg  PW V Wave 24 mmHg  PW Mean 14 mmHg  AO Systolic Pressure 469 mmHg  AO Diastolic Pressure 55 mmHg  AO Mean 85 mmHg  LV Systolic Pressure 629 mmHg  LV Diastolic Pressure 9 mmHg  LV EDP 20 mmHg  Arterial Occlusion Pressure Extended Systolic Pressure 244 mmHg  Arterial Occlusion Pressure Extended  Diastolic Pressure 64 mmHg  Arterial Occlusion Pressure Extended Mean Pressure 100 mmHg  Left Ventricular Apex Extended Systolic Pressure 010 mmHg  Left Ventricular Apex Extended Diastolic Pressure 10 mmHg  Left Ventricular Apex Extended EDP Pressure 20 mmHg  QP/QS 1  TPVR Index 6.66 HRUI  TSVR Index 26.99 HRUI  PVR SVR Ratio 0.09  TPVR/TSVR Ratio 0.25    CT CORONARY MORPH W/CTA COR W/SCORE W/CA W/CM &/OR WO/CM (Accession [id number]) (Order [id number])  Imaging  Date: 11/01/2016 Department: [facility] CT IMAGING Released By: Garth Bigness D Authorizing: Burnell Blanks, MD  Exam Information   Status Exam Begun  Exam Ended   Final [99] 11/01/2016 1:30 PM 11/01/2016 2:12 PM  PACS Images   Show images for CT CORONARY MORPH W/CTA COR W/SCORE W/CA W/CM &/OR WO/CM  Addendum   ADDENDUM REPORT: 11/01/2016 17:29  ADDENDUM: The LAA is dilated. There is thrombus in the mid and apical LAA seen in multiple gated phases   Electronically Signed By: Jenkins Rouge M.D. On: 11/01/2016 17:29   Addended by Josue Hector, MD on 11/01/2016 5:32 PM    ADDENDUM REPORT: 11/01/2016 16:26  CLINICAL DATA: Aortic stenosis  EXAM: Cardiac TAVR CT  TECHNIQUE: The patient was scanned on a Siemens 128 scanner. A 120 kV retrospective scan was triggered in the ascending thoracic aorta at 111 HU's. Gantry rotation speed was 300 msecs and collimation was .59mm. No beta blockade or nitro were given. The 3D data set was reconstructed in 10% intervals of the R-R cycle. Systolic and diastolic phases were analyzed on a dedicated work station using MPR, MIP and VRT modes. The patient received 80 cc of contrast.  FINDINGS: Aortic Valve: Tri -leaflet with severe calcification and restricted motion  Aorta: Normal root size. Moderate calcification of the STJ, root, arch and descending thoracic aorta  Mitral annular calcification  Three patent SVG;s  noted as well as patent LIMA  Sinotubular Junction: 24 mm  Ascending Thoracic Aorta: 31 mm  Aortic Arch: 28 mm  Descending Thoracic Aorta: 24 mm  Sinus of Valsalva Measurements:  Non-coronary: 28 mm  Right -coronary: 25 mm  Left -coronary: 28 mm  Coronary Artery Height above Annulus:  Left Main: 13 mm above annulus  Right Coronary: 15 mm above annulus  Virtual Basal Annulus Measurements:  Maximum/Minimum Diameter: 17. X 25.5 mm  Perimeter: 70 mm  Area: 354 mm2  Coronary Arteries: Sufficient height above annulus for deployment and patent SVG;s and patent LIMA  Optimum Fluoroscopic Angle for Delivery: LAO 9 degrees CAU 8 degrees  IMPRESSION: 1) Calcified tri leaflet aortic valve with annular area 354 mm2 suitable for a 23 mm Sapien 3 valve  2) Optimum angiographic angle for deployment LAO 9 CAU 8  3) Patent SVG;s and LIMA with native coronary arteries sufficient height above annulus for deployment  4) Significant calcification of the aorta and STJ  5) Mitral annular calcification  Jenkins Rouge   Electronically Signed By: Jenkins Rouge M.D. On: 11/01/2016 16:26       CT Angio Abd/Pel w/ and/or w/o (Accession [id number]) (Order [id number])  Imaging  Date: 11/01/2016 Department: [facility] CT IMAGING Released By: Sheffield Slider Authorizing: Burnell Blanks, MD  Exam Information   Status Exam Begun  Exam Ended   Final [99] 11/01/2016 1:32 PM 11/01/2016 2:13 PM  PACS Images   Show images for CT Angio Abd/Pel w/ and/or w/o  Study Result   CLINICAL DATA: 83 year old female with history of severe aortic stenosis. Preprocedural study prior to potential transcatheter aortic valve replacement (TAVR) procedure.  EXAM: CT ANGIOGRAPHY CHEST, ABDOMEN AND PELVIS  TECHNIQUE: Multidetector CT imaging through the chest, abdomen and pelvis was performed using the standard  protocol during bolus administration of intravenous contrast. Multiplanar reconstructed images and MIPs were obtained and reviewed to evaluate the vascular anatomy.  CONTRAST: 70 mL of Isovue 370  COMPARISON: CT the abdomen and pelvis 04/24/2006. Chest CT 07/25/2003.  FINDINGS: CTA CHEST FINDINGS  Cardiovascular: Heart size is mildly enlarged. There is no significant pericardial fluid, thickening or pericardial calcification. There is aortic atherosclerosis, as well as atherosclerosis of the great vessels of the mediastinum and the coronary arteries, including calcified atherosclerotic plaque in the left main, left anterior descending, left circumflex and right coronary arteries. Status post median sternotomy for CABG including LIMA to the LAD. Filling defect in the tip of the left atrial appendage concerning for potential thrombus. Thickening calcification of the aortic valve.  Mediastinum/Lymph Nodes: No pathologically enlarged mediastinal or hilar lymph nodes. Esophagus is unremarkable in appearance. No axillary lymphadenopathy.  Lungs/Pleura: Nodular areas of pleuroparenchymal thickening are noted in the apices of the lungs bilaterally, most compatible with areas of chronic post infectious or inflammatory scarring. Small calcified granuloma in the right lower lobe. No other larger more suspicious appearing pulmonary nodules or masses are noted. Small left-sided Bochdalek's hernia. No acute consolidative airspace disease. No pleural effusions.  Musculoskeletal/Soft Tissues: Liver has a shrunken appearance and nodular contour, suggestive of underlying cirrhosis. No definite cystic or solid hepatic lesions. No intra or extrahepatic biliary ductal dilatation. Tiny calcified gallstones lie dependently in the gallbladder. No findings to suggest an acute cholecystitis at this time.  CTA ABDOMEN AND PELVIS FINDINGS  Hepatobiliary: No pancreatic mass. No pancreatic  ductal dilatation. No pancreatic or peripancreatic fluid or inflammatory changes.  Pancreas: No pancreatic mass. No pancreatic ductal dilatation. No pancreatic or peripancreatic fluid or inflammatory changes.  Spleen: Unremarkable.  Adrenals/Urinary Tract: 4.6 cm exophytic simple cyst in the interpolar region of the right kidney. Several areas of mild cortical scarring the left kidney. subcentimeter low-attenuation lesion in the medial aspect of the interpolar region of the left kidney is too small to definitively characterize, but is statistically likely a tiny cyst. Bilateral adrenal glands are normal in appearance. No hydroureteronephrosis. Urinary bladder is normal in appearance.  Stomach/Bowel: Normal appearance of the stomach. No pathologic dilatation of small bowel or colon. Numerous colonic diverticulae are noted, without surrounding inflammatory changes to suggest an acute diverticulitis at this time. The appendix is not confidently identified and may be surgically absent. Regardless, there are no inflammatory changes noted adjacent to the cecum to suggest the presence of an acute appendicitis at this time.  Vascular/Lymphatic: Aortic atherosclerosis. Vascular findings and measurements pertinent to potential TAVR procedure, as detailed below. Celiac axis, superior mesenteric artery and inferior mesenteric artery are all patent. 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 Single renal arteries bilaterally are widely patent. No lymphadenopathy noted in the abdomen or pelvis.  Reproductive: Status post hysterectomy. Ovaries are not confidently identified may be surgically absent or atrophic.  Other: Right inguinal hernia containing only fat. Tiny umbilical hernia containing only fat. No significant volume of ascites. No pneumoperitoneum.  Musculoskeletal: There are  no aggressive appearing lytic or blastic lesions noted in the visualized portions of the skeleton.  VASCULAR MEASUREMENTS PERTINENT TO TAVR:  AORTA:  Minimal Aortic Diameter - 10 x 9 mm  Severity of Aortic Calcification - severe  RIGHT PELVIS:  Right Common Iliac Artery -  Minimal Diameter - 6.0 x 3.5 mm  Tortuosity - mild  Calcification - severe  Right External Iliac Artery -  Minimal Diameter - 5.4 x 5.2 mm  Tortuosity - mild  Calcification - none  Right Common Femoral Artery -  Minimal Diameter - 5.4 x 4.9 mm  Tortuosity - mild  Calcification - mild  LEFT PELVIS:  Left Common Iliac Artery -  Minimal Diameter - 2.1 x 6.7 mm  Tortuosity - mild  Calcification - severe  Left External Iliac Artery -  Minimal Diameter - 5.6 x 6.0 mm  Tortuosity - mild  Calcification - none  Left Common Femoral Artery -  Minimal Diameter - 6.0 x 6.2 mm  Tortuosity - mild  Calcification - mild  Review of the MIP images confirms the above findings.  IMPRESSION: 1. Vascular findings and measurements pertinent to potential TAVR procedure, as detailed above. This patient does not appear to have suitable pelvic arterial access on either side. 2. Severe thickening calcification of the aortic valve, compatible with the reported clinical history of severe aortic stenosis. 3. **An incidental finding of potential clinical significance has been found. Filling defect in the tip of the left atrial appendage, concerning for left atrial thrombus. Correlation with transesophageal echocardiography is recommended if clinically appropriate, as this places the patient at risk for systemic embolization if thrombus is present.** 4. 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 5. Aortic atherosclerosis, in addition to left main three-vessel coronary artery disease.  Please note that although the presence of coronary artery calcium documents the presence of coronary artery disease, the severity of this disease and any potential stenosis cannot be assessed on this non-gated CT examination. Assessment for potential risk factor modification, dietary therapy or pharmacologic therapy may be warranted, if clinically indicated. Status post median sternotomy for CABG including LIMA to the LAD. 6. Colonic diverticulosis without evidence of acute diverticulitis at this time. 7. Additional incidental findings, as above. These results will be called to the ordering clinician or representative by the Radiologist Assistant, and communication documented in the PACS or zVision Dashboard.   Electronically Signed By: Vinnie Langton M.D. On: 11/01/2016 15:34    STS Risk Score:  Risk of Mortality: 5.21%  Morbidity or Mortality: 20.24%  Long Length of Stay: 9.042%  Short Length of Stay: 18.961%  Permanent Stroke: 2.727%  Prolonged Ventilation: 15.083%  DSW Infection: 0.229%  Renal Failure: 4.431%  Reoperation: 8.363%    Impression:  This 83 year old woman has stage D severe symptomatic aortic stenosis with NYHA class III symptoms of fatigue and shortness of breath with minimal exertion. She has also had exertional chest pressure and dizziness. This is consistent with chronic diastolic heart failure. Her symptoms have progressed over the past 6 months and she is very limited in her activity at this time. I have personally reviewed her echo, cath, and CT studies. Her echo shows severe AS with a trileaflet aortic valve with severe leaflet calcification and a mean gradient of 54 mm Hg.  I agree that AVR is indicated in this patient who has remained independent. I don't think she is  a candidate for open surgical AVR due to her age and frailty but TAVR is a reasonable alternative. Cardiac cath shows 3/4 patent bypass grafts and no significant areas of  potential ischemia. Her cardiac CT shows anatomy favorable for a Sapien 3 valve. Her abdominal and pelvic CT shows severe aorto-iliac vascular disease but I think access may be adequate on the right for transfemoral insertion. The left side does not appear adequate for valve insertion but is adequate for diagnostic catheters. If right transfemoral access is not adequate then transapical insertion is probably the best option.   The patient and her family were counseled at length regarding treatment alternatives for management of severe symptomatic aortic stenosis. The risks and benefits of surgical intervention has been discussed in detail. Long-term prognosis with medical therapy was discussed. Alternative approaches such as conventional surgical aortic valve replacement, transcatheter aortic valve replacement, and palliative medical therapy were compared and contrasted at length. This discussion was placed in the context of the patient's own specific clinical presentation and past medical history. All of their questions been addressed. The patient is eager to proceed with surgical management as soon as possible.  Following the decision to proceed with transcatheter aortic valve replacement, a discussion was held regarding what types of management strategies would be attempted intraoperatively in the event of life-threatening complications, including whether or not the patient would be considered a candidate for the use of cardiopulmonary bypass and/or conversion to open sternotomy for attempted surgical intervention.   The patient has been advised of a variety of complications that might develop including but not limited to risks of death, stroke, paravalvular leak, aortic dissection or other major vascular complications, aortic annulus rupture, device embolization, cardiac rupture or perforation, mitral regurgitation, acute myocardial infarction, arrhythmia, heart block or bradycardia requiring permanent  pacemaker placement, congestive heart failure, respiratory failure, renal failure, pneumonia, infection, other late complications related to structural valve deterioration or migration, or other complications that might ultimately cause a temporary or permanent loss of functional independence or other long term morbidity. The patient provides full informed consent for the procedure as described and all questions were answered.    Plan:  Transfemoral TAVR.   Gaye Pollack, MD

## 2017-01-02 NOTE — Discharge Summary (Signed)
I have seen and examined the patient along with Minette Brine, PA.  I have reviewed the chart, notes and new data.  I agree with PA's note.  Key new complaints: bleeding has virtually completely stopped. Key examination changes: minimal oozing from gums, systolic 2/6 ejection murmur unchanged Key new findings / data: Hgb virtually unchanged at 10.8  PLAN: DC home today. Stay off anticoagulation until TAVR in a few days.  Sanda Klein, MD, [place] [phone] 12/29/2016

## 2017-01-02 NOTE — Anesthesia Preprocedure Evaluation (Addendum)
Anesthesia Evaluation  Patient identified by MRN, date of birth, ID band Patient awake    Reviewed: Allergy & Precautions, NPO status , Patient's Chart, lab work & pertinent test results, reviewed documented beta blocker date and time   Airway Mallampati: II  TM Distance: >3 FB     Dental   Pulmonary asthma , pneumonia,    breath sounds clear to auscultation       Cardiovascular hypertension, Pt. on medications and Pt. on home beta blockers + angina + CAD, + Past MI, + CABG and + Peripheral Vascular Disease  + Valvular Problems/Murmurs AS  Rhythm:Regular Rate:Normal  - Normal LV size with mild LV hypertrophy. EF 60-65%. Moderatediastolic dysfunction. Severe aortic stenosis. Normal RV size andsystolic function. Mild MR. Mild pulmonary hypertension.   Neuro/Psych CVA    GI/Hepatic Neg liver ROS, hiatal hernia, GERD  ,  Endo/Other  negative endocrine ROS  Renal/GU negative Renal ROS     Musculoskeletal  (+) Arthritis , Fibromyalgia -  Abdominal   Peds  Hematology  (+) anemia ,   Anesthesia Other Findings   Reproductive/Obstetrics                            Lab Results  Component Value Date   WBC 7.6 12/29/2016   HGB 10.8 (L) 12/29/2016   HCT 33.8 (L) 12/29/2016   MCV 93.9 12/29/2016   PLT 183 12/29/2016   Lab Results  Component Value Date   CREATININE 0.43 (L) 12/26/2016   BUN 11 12/26/2016   NA 141 12/26/2016   K 3.9 12/26/2016   CL 108 12/26/2016   CO2 22 12/26/2016   Lab Results  Component Value Date   INR 1.07 12/26/2016   INR 1.0 10/24/2016   INR 1.17 05/20/2014    Anesthesia Physical  Anesthesia Plan  ASA: IV  Anesthesia Plan: MAC   Post-op Pain Management:    Induction: Intravenous  PONV Risk Score and Plan: 2 and Ondansetron, Dexamethasone and Treatment may vary due to age or medical condition  Airway Management Planned: Natural Airway and Simple Face  Mask  Additional Equipment: Arterial line, CVP and Ultrasound Guidance Line Placement  Intra-op Plan:   Post-operative Plan: Possible Post-op intubation/ventilation  Informed Consent: I have reviewed the patients History and Physical, chart, labs and discussed the procedure including the risks, benefits and alternatives for the proposed anesthesia with the patient or authorized representative who has indicated his/her understanding and acceptance.   Dental advisory given  Plan Discussed with: CRNA  Anesthesia Plan Comments:       Anesthesia Quick Evaluation

## 2017-01-03 ENCOUNTER — Inpatient Hospital Stay (HOSPITAL_COMMUNITY): Payer: Medicare Other

## 2017-01-03 ENCOUNTER — Inpatient Hospital Stay (HOSPITAL_COMMUNITY): Payer: Medicare Other | Admitting: Vascular Surgery

## 2017-01-03 ENCOUNTER — Encounter (HOSPITAL_COMMUNITY): Payer: Self-pay | Admitting: *Deleted

## 2017-01-03 ENCOUNTER — Inpatient Hospital Stay (HOSPITAL_COMMUNITY)
Admission: RE | Admit: 2017-01-03 | Discharge: 2017-01-05 | DRG: 267 | Disposition: A | Discharge status: Home / Self Care | Payer: Medicare Other | Source: Ambulatory Visit | Attending: Cardiovascular Disease | Admitting: Cardiovascular Disease

## 2017-01-03 ENCOUNTER — Encounter (HOSPITAL_COMMUNITY)
Admission: RE | Disposition: A | Discharge status: Home / Self Care | Payer: Self-pay | Source: Ambulatory Visit | Attending: Cardiovascular Disease

## 2017-01-03 DIAGNOSIS — I083 Combined rheumatic disorders of mitral, aortic and tricuspid valves: Secondary | ICD-10-CM | POA: Diagnosis not present

## 2017-01-03 DIAGNOSIS — K219 Gastro-esophageal reflux disease without esophagitis: Secondary | ICD-10-CM | POA: Diagnosis present

## 2017-01-03 DIAGNOSIS — J45909 Unspecified asthma, uncomplicated: Secondary | ICD-10-CM | POA: Diagnosis present

## 2017-01-03 DIAGNOSIS — E785 Hyperlipidemia, unspecified: Secondary | ICD-10-CM | POA: Diagnosis present

## 2017-01-03 DIAGNOSIS — I480 Paroxysmal atrial fibrillation: Secondary | ICD-10-CM | POA: Diagnosis not present

## 2017-01-03 DIAGNOSIS — Z886 Allergy status to analgesic agent status: Secondary | ICD-10-CM | POA: Diagnosis not present

## 2017-01-03 DIAGNOSIS — Z953 Presence of xenogenic heart valve: Secondary | ICD-10-CM | POA: Diagnosis not present

## 2017-01-03 DIAGNOSIS — Z8673 Personal history of transient ischemic attack (TIA), and cerebral infarction without residual deficits: Secondary | ICD-10-CM

## 2017-01-03 DIAGNOSIS — Z951 Presence of aortocoronary bypass graft: Secondary | ICD-10-CM

## 2017-01-03 DIAGNOSIS — M419 Scoliosis, unspecified: Secondary | ICD-10-CM | POA: Diagnosis present

## 2017-01-03 DIAGNOSIS — Z7901 Long term (current) use of anticoagulants: Secondary | ICD-10-CM

## 2017-01-03 DIAGNOSIS — Z882 Allergy status to sulfonamides status: Secondary | ICD-10-CM | POA: Diagnosis not present

## 2017-01-03 DIAGNOSIS — M797 Fibromyalgia: Secondary | ICD-10-CM | POA: Diagnosis present

## 2017-01-03 DIAGNOSIS — I252 Old myocardial infarction: Secondary | ICD-10-CM | POA: Diagnosis not present

## 2017-01-03 DIAGNOSIS — Z006 Encounter for examination for normal comparison and control in clinical research program: Secondary | ICD-10-CM | POA: Diagnosis not present

## 2017-01-03 DIAGNOSIS — Z7951 Long term (current) use of inhaled steroids: Secondary | ICD-10-CM | POA: Diagnosis not present

## 2017-01-03 DIAGNOSIS — E876 Hypokalemia: Secondary | ICD-10-CM | POA: Diagnosis not present

## 2017-01-03 DIAGNOSIS — Z954 Presence of other heart-valve replacement: Secondary | ICD-10-CM | POA: Diagnosis not present

## 2017-01-03 DIAGNOSIS — I371 Nonrheumatic pulmonary valve insufficiency: Secondary | ICD-10-CM | POA: Diagnosis present

## 2017-01-03 DIAGNOSIS — I110 Hypertensive heart disease with heart failure: Secondary | ICD-10-CM | POA: Diagnosis present

## 2017-01-03 DIAGNOSIS — I350 Nonrheumatic aortic (valve) stenosis: Secondary | ICD-10-CM

## 2017-01-03 DIAGNOSIS — I447 Left bundle-branch block, unspecified: Secondary | ICD-10-CM | POA: Diagnosis not present

## 2017-01-03 DIAGNOSIS — I5032 Chronic diastolic (congestive) heart failure: Secondary | ICD-10-CM | POA: Diagnosis present

## 2017-01-03 DIAGNOSIS — I700 Atherosclerosis of aorta: Secondary | ICD-10-CM | POA: Diagnosis not present

## 2017-01-03 DIAGNOSIS — I10 Essential (primary) hypertension: Secondary | ICD-10-CM | POA: Diagnosis not present

## 2017-01-03 DIAGNOSIS — Z955 Presence of coronary angioplasty implant and graft: Secondary | ICD-10-CM

## 2017-01-03 DIAGNOSIS — Z90710 Acquired absence of both cervix and uterus: Secondary | ICD-10-CM | POA: Diagnosis not present

## 2017-01-03 DIAGNOSIS — Z888 Allergy status to other drugs, medicaments and biological substances status: Secondary | ICD-10-CM

## 2017-01-03 DIAGNOSIS — I639 Cerebral infarction, unspecified: Secondary | ICD-10-CM | POA: Diagnosis present

## 2017-01-03 DIAGNOSIS — M1990 Unspecified osteoarthritis, unspecified site: Secondary | ICD-10-CM | POA: Diagnosis present

## 2017-01-03 DIAGNOSIS — Z8249 Family history of ischemic heart disease and other diseases of the circulatory system: Secondary | ICD-10-CM | POA: Diagnosis not present

## 2017-01-03 DIAGNOSIS — Z79899 Other long term (current) drug therapy: Secondary | ICD-10-CM

## 2017-01-03 DIAGNOSIS — I25118 Atherosclerotic heart disease of native coronary artery with other forms of angina pectoris: Secondary | ICD-10-CM | POA: Diagnosis present

## 2017-01-03 DIAGNOSIS — I2510 Atherosclerotic heart disease of native coronary artery without angina pectoris: Secondary | ICD-10-CM | POA: Diagnosis not present

## 2017-01-03 HISTORY — PX: TEE WITHOUT CARDIOVERSION: SHX5443

## 2017-01-03 HISTORY — PX: TRANSCATHETER AORTIC VALVE REPLACEMENT, TRANSFEMORAL: SHX6400

## 2017-01-03 LAB — ECHOCARDIOGRAM LIMITED
AV Area VTI: 1.79 cm2
AV Area mean vel: 1.84 cm2
AV Mean grad: 5 mmHg
AV Peak grad: 10 mmHg
AV VEL mean LVOT/AV: 0.72
AV area mean vel ind: 1.12 cm2/m2
AV vel: 1.7
AVA: 1.7 cm2
AVAREAVTIIND: 1.04 cm2/m2
AVLVOTPG: 5 mmHg
AVPKVEL: 160 cm/s
Ao pk vel: 0.71 m/s
CHL CUP AV PEAK INDEX: 1.09
CHL CUP AV VALUE AREA INDEX: 1.04
CHL CUP DOP CALC LVOT VTI: 25.8 cm
DOP CAL AO MEAN VELOCITY: 102 cm/s
LDCA: 2.54 cm2
LVOT SV: 66 mL
LVOTD: 18 mm
LVOTPV: 113 cm/s
LVOTVTI: 0.67 cm
VTI: 38.6 cm

## 2017-01-03 LAB — POCT I-STAT, CHEM 8
BUN: 10 mg/dL (ref 6–20)
BUN: 9 mg/dL (ref 6–20)
BUN: 9 mg/dL (ref 6–20)
CALCIUM ION: 1.25 mmol/L (ref 1.15–1.40)
CHLORIDE: 104 mmol/L (ref 101–111)
CREATININE: 0.3 mg/dL — AB (ref 0.44–1.00)
Calcium, Ion: 1.3 mmol/L (ref 1.15–1.40)
Calcium, Ion: 1.26 mmol/L (ref 1.15–1.40)
Chloride: 102 mmol/L (ref 101–111)
Chloride: 104 mmol/L (ref 101–111)
Creatinine, Ser: 0.4 mg/dL — ABNORMAL LOW (ref 0.44–1.00)
Creatinine, Ser: 0.3 mg/dL — ABNORMAL LOW (ref 0.44–1.00)
GLUCOSE: 124 mg/dL — AB (ref 65–99)
Glucose, Bld: 162 mg/dL — ABNORMAL HIGH (ref 65–99)
Glucose, Bld: 165 mg/dL — ABNORMAL HIGH (ref 65–99)
HCT: 29 % — ABNORMAL LOW (ref 36.0–46.0)
HCT: 25 % — ABNORMAL LOW (ref 36.0–46.0)
HEMATOCRIT: 24 % — AB (ref 36.0–46.0)
HEMOGLOBIN: 9.9 g/dL — AB (ref 12.0–15.0)
HEMOGLOBIN: 8.2 g/dL — AB (ref 12.0–15.0)
Hemoglobin: 8.5 g/dL — ABNORMAL LOW (ref 12.0–15.0)
POTASSIUM: 3.9 mmol/L (ref 3.5–5.1)
POTASSIUM: 3.8 mmol/L (ref 3.5–5.1)
POTASSIUM: 3.7 mmol/L (ref 3.5–5.1)
SODIUM: 142 mmol/L (ref 135–145)
Sodium: 141 mmol/L (ref 135–145)
Sodium: 142 mmol/L (ref 135–145)
TCO2: 27 mmol/L (ref 0–100)
TCO2: 28 mmol/L (ref 0–100)
TCO2: 27 mmol/L (ref 0–100)

## 2017-01-03 LAB — POCT I-STAT 3, ART BLOOD GAS (G3+)
ACID-BASE EXCESS: 4 mmol/L — AB (ref 0.0–2.0)
Acid-Base Excess: 4 mmol/L — ABNORMAL HIGH (ref 0.0–2.0)
BICARBONATE: 28.3 mmol/L — AB (ref 20.0–28.0)
BICARBONATE: 29.5 mmol/L — AB (ref 20.0–28.0)
O2 SAT: 100 %
O2 Saturation: 94 %
PCO2 ART: 42.6 mmHg (ref 32.0–48.0)
PH ART: 7.451 — AB (ref 7.350–7.450)
PO2 ART: 168 mmHg — AB (ref 83.0–108.0)
Patient temperature: 96
TCO2: 30 mmol/L (ref 0–100)
TCO2: 31 mmol/L (ref 0–100)
pCO2 arterial: 40.7 mmHg (ref 32.0–48.0)
pH, Arterial: 7.442 (ref 7.350–7.450)
pO2, Arterial: 69 mmHg — ABNORMAL LOW (ref 83.0–108.0)

## 2017-01-03 LAB — CBC
HEMATOCRIT: 28.2 % — AB (ref 36.0–46.0)
HEMOGLOBIN: 9.1 g/dL — AB (ref 12.0–15.0)
MCH: 30.1 pg (ref 26.0–34.0)
MCHC: 32.3 g/dL (ref 30.0–36.0)
MCV: 93.4 fL (ref 78.0–100.0)
Platelets: 147 10*3/uL — ABNORMAL LOW (ref 150–400)
RBC: 3.02 MIL/uL — AB (ref 3.87–5.11)
RDW: 14.7 % (ref 11.5–15.5)
WBC: 6.4 10*3/uL (ref 4.0–10.5)

## 2017-01-03 LAB — PROTIME-INR
INR: 1.31
Prothrombin Time: 16.4 seconds — ABNORMAL HIGH (ref 11.4–15.2)

## 2017-01-03 LAB — APTT: APTT: 39 s — AB (ref 24–36)

## 2017-01-03 LAB — POCT I-STAT 4, (NA,K, GLUC, HGB,HCT)
GLUCOSE: 156 mg/dL — AB (ref 65–99)
HCT: 25 % — ABNORMAL LOW (ref 36.0–46.0)
Hemoglobin: 8.5 g/dL — ABNORMAL LOW (ref 12.0–15.0)
POTASSIUM: 3.7 mmol/L (ref 3.5–5.1)
SODIUM: 142 mmol/L (ref 135–145)

## 2017-01-03 LAB — TYPE AND SCREEN
ABO/RH(D): A POS
ANTIBODY SCREEN: NEGATIVE

## 2017-01-03 SURGERY — IMPLANTATION, AORTIC VALVE, TRANSCATHETER, FEMORAL APPROACH
Anesthesia: Monitor Anesthesia Care

## 2017-01-03 MED ORDER — ISOSORBIDE DINITRATE 30 MG PO TABS
30.0000 mg | ORAL_TABLET | Freq: Two times a day (BID) | ORAL | Status: DC
  Administered 2017-01-03 – 2017-01-05 (×4): 30 mg via ORAL
  Filled 2017-01-03 (×2): qty 1
  Filled 2017-01-03: qty 3
  Filled 2017-01-03 (×2): qty 1
  Filled 2017-01-03: qty 3
  Filled 2017-01-03 (×2): qty 1

## 2017-01-03 MED ORDER — IRBESARTAN 150 MG PO TABS
75.0000 mg | ORAL_TABLET | Freq: Every day | ORAL | Status: DC
  Administered 2017-01-04 – 2017-01-05 (×2): 75 mg via ORAL
  Filled 2017-01-03 (×3): qty 1

## 2017-01-03 MED ORDER — ROSUVASTATIN CALCIUM 10 MG PO TABS
40.0000 mg | ORAL_TABLET | Freq: Every evening | ORAL | Status: DC
  Administered 2017-01-03 – 2017-01-04 (×2): 40 mg via ORAL
  Filled 2017-01-03: qty 2
  Filled 2017-01-03 (×2): qty 1
  Filled 2017-01-03: qty 2

## 2017-01-03 MED ORDER — ENSURE ENLIVE PO LIQD
237.0000 mL | Freq: Two times a day (BID) | ORAL | Status: DC
  Administered 2017-01-04: 237 mL via ORAL

## 2017-01-03 MED ORDER — MIDAZOLAM HCL 2 MG/2ML IJ SOLN: 2.0000 mg | INTRAMUSCULAR | Status: DC | PRN

## 2017-01-03 MED ORDER — MORPHINE SULFATE (PF) 4 MG/ML IV SOLN: 2.0000 mg | INTRAVENOUS | Status: DC | PRN

## 2017-01-03 MED ORDER — ONDANSETRON HCL 4 MG/2ML IJ SOLN: 4.0000 mg | Freq: Four times a day (QID) | INTRAMUSCULAR | Status: DC | PRN

## 2017-01-03 MED ORDER — VANCOMYCIN HCL IN DEXTROSE 1-5 GM/200ML-% IV SOLN
1000.0000 mg | Freq: Once | INTRAVENOUS | Status: AC
  Administered 2017-01-03: 1000 mg via INTRAVENOUS
  Filled 2017-01-03: qty 200

## 2017-01-03 MED ORDER — MORPHINE SULFATE (PF) 2 MG/ML IV SOLN: 2.0000 mg | INTRAVENOUS | Status: DC | PRN

## 2017-01-03 MED ORDER — GUAIFENESIN ER 600 MG PO TB12
600.0000 mg | ORAL_TABLET | Freq: Two times a day (BID) | ORAL | Status: DC
  Administered 2017-01-03 – 2017-01-05 (×4): 600 mg via ORAL
  Filled 2017-01-03 (×4): qty 1

## 2017-01-03 MED ORDER — DEXAMETHASONE SODIUM PHOSPHATE 10 MG/ML IJ SOLN
INTRAMUSCULAR | Status: DC | PRN
  Administered 2017-01-03: 8 mg via INTRAVENOUS

## 2017-01-03 MED ORDER — BUPIVACAINE HCL (PF) 0.5 % IJ SOLN
INTRAMUSCULAR | Status: AC
  Filled 2017-01-03: qty 30

## 2017-01-03 MED ORDER — CHLORHEXIDINE GLUCONATE 4 % EX LIQD
30.0000 mL | CUTANEOUS | Status: DC
Start: 2017-01-03 — End: 2017-01-03

## 2017-01-03 MED ORDER — EPINEPHRINE PF 1 MG/ML IJ SOLN
INTRAMUSCULAR | Status: AC
  Filled 2017-01-03: qty 2

## 2017-01-03 MED ORDER — PROPOFOL 10 MG/ML IV BOLUS
INTRAVENOUS | Status: AC
  Filled 2017-01-03: qty 20

## 2017-01-03 MED ORDER — 0.9 % SODIUM CHLORIDE (POUR BTL) OPTIME
TOPICAL | Status: DC | PRN
  Administered 2017-01-03: 1000 mL

## 2017-01-03 MED ORDER — ACETAMINOPHEN 650 MG RE SUPP: 650.0000 mg | Freq: Once | RECTAL | Status: AC

## 2017-01-03 MED ORDER — POTASSIUM CHLORIDE 10 MEQ/50ML IV SOLN
10.0000 meq | INTRAVENOUS | Status: AC
  Administered 2017-01-03 (×3): 10 meq via INTRAVENOUS
  Filled 2017-01-03 (×2): qty 50

## 2017-01-03 MED ORDER — HEPARIN SODIUM (PORCINE) 5000 UNIT/ML IJ SOLN
INTRAMUSCULAR | Status: DC | PRN
  Administered 2017-01-03: 1500 mL

## 2017-01-03 MED ORDER — LACTATED RINGERS IV SOLN
INTRAVENOUS | Status: DC | PRN
  Administered 2017-01-03: 07:00:00 via INTRAVENOUS

## 2017-01-03 MED ORDER — LIDOCAINE HCL 1 % IJ SOLN
INTRAMUSCULAR | Status: DC | PRN
  Administered 2017-01-03: 12 mL

## 2017-01-03 MED ORDER — SODIUM CHLORIDE 0.9 % IV SOLN
0.0125 ug/kg/min | INTRAVENOUS | Status: DC
  Filled 2017-01-03: qty 2000

## 2017-01-03 MED ORDER — HEPARIN SODIUM (PORCINE) 1000 UNIT/ML IJ SOLN
INTRAMUSCULAR | Status: DC | PRN
  Administered 2017-01-03: 8000 [IU] via INTRAVENOUS

## 2017-01-03 MED ORDER — DOPAMINE-DEXTROSE 3.2-5 MG/ML-% IV SOLN
INTRAVENOUS | Status: AC
  Filled 2017-01-03: qty 250

## 2017-01-03 MED ORDER — OXYCODONE HCL 5 MG PO TABS: 5.0000 mg | ORAL_TABLET | ORAL | Status: DC | PRN

## 2017-01-03 MED ORDER — FENTANYL CITRATE (PF) 250 MCG/5ML IJ SOLN
INTRAMUSCULAR | Status: AC
  Filled 2017-01-03: qty 5

## 2017-01-03 MED ORDER — CHLORHEXIDINE GLUCONATE CLOTH 2 % EX PADS
6.0000 | MEDICATED_PAD | Freq: Every day | CUTANEOUS | Status: DC
  Administered 2017-01-04: 6 via TOPICAL

## 2017-01-03 MED ORDER — FENTANYL CITRATE (PF) 250 MCG/5ML IJ SOLN
INTRAMUSCULAR | Status: DC | PRN
  Administered 2017-01-03 (×2): 25 ug via INTRAVENOUS

## 2017-01-03 MED ORDER — MIDAZOLAM HCL 2 MG/2ML IJ SOLN
INTRAMUSCULAR | Status: AC
  Filled 2017-01-03: qty 2

## 2017-01-03 MED ORDER — ACETAMINOPHEN 500 MG PO TABS
1000.0000 mg | ORAL_TABLET | Freq: Four times a day (QID) | ORAL | Status: DC
  Administered 2017-01-04 – 2017-01-05 (×3): 1000 mg via ORAL
  Filled 2017-01-03 (×3): qty 2

## 2017-01-03 MED ORDER — FAMOTIDINE IN NACL 20-0.9 MG/50ML-% IV SOLN
20.0000 mg | Freq: Two times a day (BID) | INTRAVENOUS | Status: AC
  Administered 2017-01-03 (×2): 20 mg via INTRAVENOUS
  Filled 2017-01-03 (×2): qty 50

## 2017-01-03 MED ORDER — ONDANSETRON HCL 4 MG/2ML IJ SOLN
INTRAMUSCULAR | Status: DC | PRN
  Administered 2017-01-03: 4 mg via INTRAVENOUS

## 2017-01-03 MED ORDER — DEXTROSE 5 % IV SOLN
1.5000 g | Freq: Two times a day (BID) | INTRAVENOUS | Status: DC
  Administered 2017-01-03 – 2017-01-04 (×2): 1.5 g via INTRAVENOUS
  Filled 2017-01-03 (×6): qty 1.5

## 2017-01-03 MED ORDER — IODIXANOL 320 MG/ML IV SOLN
INTRAVENOUS | Status: DC | PRN
  Administered 2017-01-03: 85.8 mL via INTRAVENOUS

## 2017-01-03 MED ORDER — SODIUM CHLORIDE 0.9% FLUSH: 10.0000 mL | INTRAVENOUS | Status: DC | PRN

## 2017-01-03 MED ORDER — MIDAZOLAM HCL 2 MG/2ML IJ SOLN
INTRAMUSCULAR | Status: DC | PRN
  Administered 2017-01-03: 0.5 mg via INTRAVENOUS

## 2017-01-03 MED ORDER — PROTAMINE SULFATE 10 MG/ML IV SOLN
INTRAVENOUS | Status: DC | PRN
  Administered 2017-01-03: 80 mg via INTRAVENOUS

## 2017-01-03 MED ORDER — MAGNESIUM SULFATE 4 GM/100ML IV SOLN
4.0000 g | Freq: Once | INTRAVENOUS | Status: AC
  Administered 2017-01-03: 4 g via INTRAVENOUS
  Filled 2017-01-03: qty 100

## 2017-01-03 MED ORDER — FLUTICASONE PROPIONATE 50 MCG/ACT NA SUSP
1.0000 | Freq: Every day | NASAL | Status: DC
  Administered 2017-01-04 – 2017-01-05 (×2): 1 via NASAL
  Filled 2017-01-03: qty 16

## 2017-01-03 MED ORDER — ACETAMINOPHEN 160 MG/5ML PO SOLN
1000.0000 mg | Freq: Four times a day (QID) | ORAL | Status: DC
  Administered 2017-01-04 – 2017-01-05 (×3): 1000 mg
  Filled 2017-01-03 (×3): qty 40.6

## 2017-01-03 MED ORDER — ALBUMIN HUMAN 5 % IV SOLN: 250.0000 mL | INTRAVENOUS | Status: DC | PRN

## 2017-01-03 MED ORDER — FENTANYL CITRATE (PF) 100 MCG/2ML IJ SOLN: 25.0000 ug | INTRAMUSCULAR | Status: DC | PRN

## 2017-01-03 MED ORDER — LIDOCAINE HCL (PF) 1 % IJ SOLN
INTRAMUSCULAR | Status: AC
  Filled 2017-01-03: qty 30

## 2017-01-03 MED ORDER — PANTOPRAZOLE SODIUM 40 MG PO TBEC
40.0000 mg | DELAYED_RELEASE_TABLET | Freq: Two times a day (BID) | ORAL | Status: DC
  Administered 2017-01-03 – 2017-01-05 (×4): 40 mg via ORAL
  Filled 2017-01-03 (×4): qty 1

## 2017-01-03 MED ORDER — TRAMADOL HCL 50 MG PO TABS: 50.0000 mg | ORAL_TABLET | ORAL | Status: DC | PRN

## 2017-01-03 MED ORDER — DOPAMINE-DEXTROSE 3.2-5 MG/ML-% IV SOLN
0.0000 ug/kg/min | INTRAVENOUS | Status: DC
  Administered 2017-01-03: 10 ug/kg/min via INTRAVENOUS

## 2017-01-03 MED ORDER — SODIUM CHLORIDE 0.9 % IV SOLN
INTRAVENOUS | Status: AC
  Administered 2017-01-03: 12:00:00 via INTRAVENOUS

## 2017-01-03 MED ORDER — LORATADINE 10 MG PO TABS
10.0000 mg | ORAL_TABLET | Freq: Every day | ORAL | Status: DC
  Administered 2017-01-04 – 2017-01-05 (×2): 10 mg via ORAL
  Filled 2017-01-03 (×2): qty 1

## 2017-01-03 MED ORDER — ACETAMINOPHEN 160 MG/5ML PO SOLN
650.0000 mg | Freq: Once | ORAL | Status: AC
  Administered 2017-01-03: 650 mg
  Filled 2017-01-03: qty 20.3

## 2017-01-03 MED ORDER — LACTATED RINGERS IV SOLN: 500.0000 mL | Freq: Once | INTRAVENOUS | Status: DC | PRN

## 2017-01-03 MED FILL — Potassium Chloride Inj 2 mEq/ML: INTRAVENOUS | Qty: 40 | Status: AC

## 2017-01-03 MED FILL — Heparin Sodium (Porcine) Inj 1000 Unit/ML: INTRAMUSCULAR | Qty: 30 | Status: AC

## 2017-01-03 MED FILL — Magnesium Sulfate Inj 50%: INTRAMUSCULAR | Qty: 10 | Status: AC

## 2017-01-03 SURGICAL SUPPLY — 101 items
ADAPTER UNIV SWAN GANZ BIP (ADAPTER) ×1 IMPLANT
ADAPTER UNV SWAN GANZ BIP (ADAPTER) ×2
ADH SKN CLS APL DERMABOND .7 (GAUZE/BANDAGES/DRESSINGS) ×1
ADPR CATH UNV NS SG CATH (ADAPTER) ×1
BAG BANDED W/RUBBER/TAPE 36X54 (MISCELLANEOUS) ×3 IMPLANT
BAG DECANTER FOR FLEXI CONT (MISCELLANEOUS) IMPLANT
BAG EQP BAND 135X91 W/RBR TAPE (MISCELLANEOUS) ×1
BAG SNAP BAND KOVER 36X36 (MISCELLANEOUS) ×6 IMPLANT
BLADE CLIPPER SURG (BLADE) IMPLANT
BLADE OSCILLATING /SAGITTAL (BLADE) IMPLANT
BLADE STERNUM SYSTEM 6 (BLADE) ×3 IMPLANT
CABLE ADAPT CONN TEMP 6FT (ADAPTER) ×3 IMPLANT
CABLE PACING FASLOC BIEGE (MISCELLANEOUS) ×3 IMPLANT
CABLE PACING FASLOC BLUE (MISCELLANEOUS) ×3 IMPLANT
CANNULA FEM VENOUS REMOTE 22FR (CANNULA) IMPLANT
CANNULA OPTISITE PERFUSION 16F (CANNULA) IMPLANT
CANNULA OPTISITE PERFUSION 18F (CANNULA) IMPLANT
CATH DIAG EXPO 6F VENT PIG 145 (CATHETERS) ×6 IMPLANT
CATH EXPO 5FR AL1 (CATHETERS) ×3 IMPLANT
CATH S G BIP PACING (SET/KITS/TRAYS/PACK) ×6 IMPLANT
CLIP VESOCCLUDE MED 24/CT (CLIP) ×3 IMPLANT
CLIP VESOCCLUDE SM WIDE 24/CT (CLIP) ×3 IMPLANT
CONT SPEC 4OZ CLIKSEAL STRL BL (MISCELLANEOUS) ×6 IMPLANT
COVER BACK TABLE 24X17X13 BIG (DRAPES) ×3 IMPLANT
COVER BACK TABLE 60X90IN (DRAPES) ×3 IMPLANT
COVER BACK TABLE 80X110 HD (DRAPES) ×3 IMPLANT
COVER DOME SNAP 22 D (MISCELLANEOUS) ×3 IMPLANT
COVER MAYO STAND STRL (DRAPES) ×3 IMPLANT
CRADLE DONUT ADULT HEAD (MISCELLANEOUS) ×3 IMPLANT
DERMABOND ADVANCED (GAUZE/BANDAGES/DRESSINGS) ×2
DERMABOND ADVANCED .7 DNX12 (GAUZE/BANDAGES/DRESSINGS) ×1 IMPLANT
DEVICE CLOSURE PERCLS PRGLD 6F (VASCULAR PRODUCTS) ×2 IMPLANT
DRAPE INCISE IOBAN 66X45 STRL (DRAPES) IMPLANT
DRAPE SLUSH MACHINE 52X66 (DRAPES) ×3 IMPLANT
DRSG ADAPTIC 3X8 NADH LF (GAUZE/BANDAGES/DRESSINGS) ×2 IMPLANT
DRSG TEGADERM 4X4.75 (GAUZE/BANDAGES/DRESSINGS) ×3 IMPLANT
ELECT REM PT RETURN 9FT ADLT (ELECTROSURGICAL) ×6
ELECTRODE REM PT RTRN 9FT ADLT (ELECTROSURGICAL) ×2 IMPLANT
FELT TEFLON 6X6 (MISCELLANEOUS) ×3 IMPLANT
FEMORAL VENOUS CANN RAP (CANNULA) IMPLANT
GAUZE SPONGE 4X4 12PLY STRL (GAUZE/BANDAGES/DRESSINGS) ×3 IMPLANT
GAUZE SPONGE 4X4 12PLY STRL LF (GAUZE/BANDAGES/DRESSINGS) ×2 IMPLANT
GLOVE BIO SURGEON STRL SZ8 (GLOVE) ×6 IMPLANT
GLOVE EUDERMIC 7 POWDERFREE (GLOVE) ×6 IMPLANT
GLOVE ORTHO TXT STRL SZ7.5 (GLOVE) ×6 IMPLANT
GOWN STRL REUS W/ TWL LRG LVL3 (GOWN DISPOSABLE) ×3 IMPLANT
GOWN STRL REUS W/ TWL XL LVL3 (GOWN DISPOSABLE) ×6 IMPLANT
GOWN STRL REUS W/TWL LRG LVL3 (GOWN DISPOSABLE) ×9
GOWN STRL REUS W/TWL XL LVL3 (GOWN DISPOSABLE) ×18
GUIDEWIRE SAF TJ AMPL .035X180 (WIRE) ×3 IMPLANT
GUIDEWIRE SAFE TJ AMPLATZ EXST (WIRE) ×3 IMPLANT
GUIDEWIRE STRAIGHT .035 260CM (WIRE) ×3 IMPLANT
INSERT FOGARTY 61MM (MISCELLANEOUS) ×3 IMPLANT
INSERT FOGARTY SM (MISCELLANEOUS) IMPLANT
INSERT FOGARTY XLG (MISCELLANEOUS) IMPLANT
KIT BASIN OR (CUSTOM PROCEDURE TRAY) ×3 IMPLANT
KIT DILATOR VASC 18G NDL (KITS) IMPLANT
KIT HEART LEFT (KITS) ×3 IMPLANT
KIT ROOM TURNOVER OR (KITS) ×3 IMPLANT
KIT SUCTION CATH 14FR (SUCTIONS) ×6 IMPLANT
NDL PERC 18GX7CM (NEEDLE) ×1 IMPLANT
NEEDLE PERC 18GX7CM (NEEDLE) ×3 IMPLANT
NS IRRIG 1000ML POUR BTL (IV SOLUTION) ×9 IMPLANT
PACK AORTA (CUSTOM PROCEDURE TRAY) ×3 IMPLANT
PAD ARMBOARD 7.5X6 YLW CONV (MISCELLANEOUS) ×6 IMPLANT
PAD ELECT DEFIB RADIOL ZOLL (MISCELLANEOUS) ×3 IMPLANT
PERCLOSE PROGLIDE 6F (VASCULAR PRODUCTS) ×9
SET MICROPUNCTURE 5F STIFF (MISCELLANEOUS) ×3 IMPLANT
SHEATH AVANTI 11CM 8FR (MISCELLANEOUS) ×3 IMPLANT
SHEATH BRITE TIP 6FR 35CM (SHEATH) ×2 IMPLANT
SHEATH PINNACLE 6F 10CM (SHEATH) ×6 IMPLANT
SHEATH PINNACLE R/O II 6F 4CM (SHEATH) ×2 IMPLANT
SLEEVE REPOSITIONING LENGTH 30 (MISCELLANEOUS) ×3 IMPLANT
SPONGE LAP 4X18 X RAY DECT (DISPOSABLE) ×3 IMPLANT
STOPCOCK MORSE 400PSI 3WAY (MISCELLANEOUS) ×18 IMPLANT
SUT ETHIBOND X763 2 0 SH 1 (SUTURE) IMPLANT
SUT GORETEX CV 4 TH 22 36 (SUTURE) IMPLANT
SUT GORETEX CV4 TH-18 (SUTURE) IMPLANT
SUT GORETEX TH-18 36 INCH (SUTURE) IMPLANT
SUT PROLENE 3 0 SH1 36 (SUTURE) IMPLANT
SUT PROLENE 4 0 RB 1 (SUTURE)
SUT PROLENE 4-0 RB1 .5 CRCL 36 (SUTURE) IMPLANT
SUT PROLENE 5 0 C 1 36 (SUTURE) IMPLANT
SUT PROLENE 6 0 C 1 30 (SUTURE) IMPLANT
SUT SILK  1 MH (SUTURE) ×2
SUT SILK 1 MH (SUTURE) ×1 IMPLANT
SUT SILK 2 0 SH CR/8 (SUTURE) IMPLANT
SUT VIC AB 2-0 CT1 27 (SUTURE)
SUT VIC AB 2-0 CT1 TAPERPNT 27 (SUTURE) IMPLANT
SUT VIC AB 2-0 CTX 36 (SUTURE) IMPLANT
SUT VIC AB 3-0 SH 8-18 (SUTURE) IMPLANT
SYR 10ML LL (SYRINGE) ×9 IMPLANT
SYR 30ML LL (SYRINGE) ×6 IMPLANT
SYR 50ML LL SCALE MARK (SYRINGE) ×3 IMPLANT
TOWEL OR 17X26 10 PK STRL BLUE (TOWEL DISPOSABLE) ×6 IMPLANT
TRANSDUCER W/STOPCOCK (MISCELLANEOUS) ×6 IMPLANT
TRAY FOLEY SILVER 16FR TEMP (SET/KITS/TRAYS/PACK) ×3 IMPLANT
TUBING HIGH PRESSURE 120CM (CONNECTOR) ×3 IMPLANT
VALVE HEART TRANSCATH SZ3 23MM (Prosthesis & Implant Heart) ×2 IMPLANT
WIRE AMPLATZ SS-J .035X180CM (WIRE) ×3 IMPLANT
WIRE BENTSON .035X145CM (WIRE) ×3 IMPLANT

## 2017-01-03 NOTE — Anesthesia Procedure Notes (Addendum)
Arterial Line Insertion Start/End8/14/2018 6:45 AM, 01/03/2017 6:55 AM Performed by: Mervyn Gay, CRNA  Patient location: Pre-op. Preanesthetic checklist: patient identified, IV checked, site marked, risks and benefits discussed, surgical consent, monitors and equipment checked, pre-op evaluation, timeout performed and anesthesia consent Lidocaine 1% used for infiltration Right, radial was placed Catheter size: 20 G Hand hygiene performed  and maximum sterile barriers used   Attempts: 1 Procedure performed without using ultrasound guided technique. Following insertion, dressing applied and Biopatch. Post procedure assessment: normal  Patient tolerated the procedure well with no immediate complications.

## 2017-01-03 NOTE — Anesthesia Procedure Notes (Signed)
Central Venous Catheter Insertion Performed by: Suzette Battiest, anesthesiologist Start/End8/14/2018 7:15 AM, 01/03/2017 7:25 AM Patient location: Pre-op. Preanesthetic checklist: patient identified, IV checked, site marked, risks and benefits discussed, surgical consent, monitors and equipment checked, pre-op evaluation, timeout performed and anesthesia consent Position: Trendelenburg Lidocaine 1% used for infiltration and patient sedated Hand hygiene performed , maximum sterile barriers used  and Seldinger technique used Catheter size: 8 Fr Total catheter length 16. Central line was placed.Double lumen Procedure performed using ultrasound guided technique. Ultrasound Notes:anatomy identified, needle tip was noted to be adjacent to the nerve/plexus identified, no ultrasound evidence of intravascular and/or intraneural injection and image(s) printed for medical record Attempts: 3 Following insertion, dressing applied, line sutured and Biopatch. Post procedure assessment: blood return through all ports  Post procedure complications: local hematoma. Patient tolerated the procedure well with no immediate complications.

## 2017-01-03 NOTE — Discharge Summary (Signed)
Discharge Summary    Patient ID: [name],  MRN: [id number], DOB/AGE: [birth year] 81 y.o.  Admit date: 01/03/2017 Discharge date: 01/05/2017  Primary Care Provider: Denita Lung Primary Cardiologist: Angelena Form  Discharge Diagnoses    Active Problems:   Status post transcatheter aortic valve replacement (TAVR) using bioprosthesis   Allergies Allergies  Allergen Reactions  . Bactrim [Sulfamethoxazole-Trimethoprim] Other (See Comments)    "makes her feel funny" or "unsteady"   . Amitriptyline Hcl Rash  . Aspirin Swelling and Rash    SWELLING REACTION UNSPECIFIED   . Zetia [Ezetimibe] Rash    Diagnostic Studies/Procedures    TAVR: 01/03/17  Procedure:        Transcatheter Aortic Valve Replacement - Transfemoral Approach             Edwards Sapien 3 THV (size 23 mm, model # F048547, serial # [id number])              Co-Surgeons:     Gilford Raid, MD and Lauree Chandler, MD  Anesthesiologist:    Ola Spurr  Echocardiographer:    Johnsie Cancel  Pre-operative Echo Findings: ? Severe aortic stenosis ? Normal left ventricular systolic function  Post-operative Echo Findings: ? No paravalvular leak ? Normal left ventricular systolic function   TTE: 4/81/85  Study Conclusions  - Left ventricle: The cavity size was normal. Wall thickness was   increased in a pattern of mild LVH. Systolic function was normal.   The estimated ejection fraction was in the range of 60% to 65%.   Wall motion was normal; there were no regional wall motion   abnormalities. Features are consistent with a pseudonormal left   ventricular filling pattern, with concomitant abnormal relaxation   and increased filling pressure (grade 2 diastolic dysfunction). - Aortic valve: Bioprosthetic aortic valve s/p TAVR. No significant   bioprosthetic valvular stenosis. There was no significant   regurgitation. Mean gradient (S): 11 mm Hg. Valve area (VTI):   1.53 cm^2. - Mitral valve:  Mildly calcified annulus. Mildly calcified leaflets   . There was trivial regurgitation. - Right ventricle: The cavity size was normal. Systolic function   was normal. - Tricuspid valve: Peak RV-RA gradient (S): 34 mm Hg. - Pulmonary arteries: PA peak pressure: 37 mm Hg (S). - Inferior vena cava: The vessel was normal in size. The   respirophasic diameter changes were in the normal range (= 50%),   consistent with normal central venous pressure.  Impressions:  - Normal LV size with mild LV hypertrophy. EF 60-65%. Moderate   diastolic dysfunction. Normal RV size and systolic function.   Bioprosthetic aortic valve s/p TAVR with no significant   regurgitation or stenosis. Mild pulmonary hypertension. _____________   History of Present Illness     83 yo female with history of CAD s/p CABG, atrial fib s/p MAZE procedure, CVA, carotid artery disease s/p right CEA, fibromyalgia, GERD, mild to moderate MR, severe AS who was referred for TAVR. She underwent CABG in 2006 with MAZE procedure. She is known to have moderate carotid disease. She was found to have severe AS by echo in July 2017 along with moderate MR. Dr. Angelena Form discussed her aortic valve disease at that time but she did not wish to pursue further evaluation. She is known to have atrial fibrillation but has refused anticoagulation due to hematuria when on Eliquis in 2015. [facility] stress test at [facility] June 2016 during admission for chest pain did not show ischemia. She was seen by  Dr. Angelena Form in May 2018 and she c/o dizziness, blurred vision, chest pain and dyspnea. Echo 10/14/16 with normal LV systolic function, mild mitral regurgitation and severe aortic stenosis (mean gradient 54 mm Hg peak gradient 101 mmHg). She underwent cardiac cath with 3/4 patent bypass grafts. Carotid stenosis was moderate. She has had progressive dyspnea and chest pain felt to be due to her AS. She underwent clinical work up and felt to be appropriate for TAVR.    Hospital Course     Consultants: TCTS  Underwent successful TAVR with [place] (size35mm) with Dr. Angelena Form and Dr. Cyndia Bent on 01/03/17. Felt well post op. She was transferred to telemetry on 01/04/17. Developed some post op bradycardia, therefore her metoprolol and Cardizem were held, but blood pressure improved. Metoprolol and Cardiezm were resumed. She has a hx of ASA allergy. Plan for Eliquis only. Follow up echo showed stable valve placement. She worked well with cardiac rehab. Of note her valsartan was switched to avapro this admission.   She was seen by Dr. Angelena Form and determined stable for discharge home. Follow up in the office has been arranged. Medications are listed below.  _____________  Discharge Vitals Blood pressure 136/67, pulse 79, temperature 98.4 F (36.9 C), temperature source Oral, resp. rate (!) 23, height 5\' 5"  (1.651 m), weight 119 lb 4.8 oz (54.1 kg), SpO2 97 %.  Filed Weights   01/04/17 0300 01/04/17 2033 01/05/17 0427  Weight: 121 lb 4.1 oz (55 kg) 133 lb 4.8 oz (60.5 kg) 119 lb 4.8 oz (54.1 kg)    Labs & Radiologic Studies    CBC  Recent Labs  01/05/17 0049 01/05/17 0344  WBC 10.2 7.3  HGB 10.5* 9.7*  HCT 32.9* 30.4*  MCV 92.9 92.4  PLT 187 546   Basic Metabolic Panel  Recent Labs  01/04/17 0500 01/05/17 0049 01/05/17 0344  NA 139 141 141  K 4.0 3.5 3.3*  CL 106 107 109  CO2 23 25 27   GLUCOSE 129* 133* 111*  BUN 8 11 11   CREATININE 0.50 0.54 0.46  CALCIUM 8.9 8.9 8.7*  MG 2.3  --   --    Liver Function Tests No results for input(s): AST, ALT, ALKPHOS, BILITOT, PROT, ALBUMIN in the last 72 hours. No results for input(s): LIPASE, AMYLASE in the last 72 hours. Cardiac Enzymes No results for input(s): CKTOTAL, CKMB, CKMBINDEX, TROPONINI in the last 72 hours. BNP Invalid input(s): POCBNP D-Dimer No results for input(s): DDIMER in the last 72 hours. Hemoglobin A1C No results for input(s): HGBA1C in the last 72  hours. Fasting Lipid Panel No results for input(s): CHOL, HDL, LDLCALC, TRIG, CHOLHDL, LDLDIRECT in the last 72 hours. Thyroid Function Tests No results for input(s): TSH, T4TOTAL, T3FREE, THYROIDAB in the last 72 hours.  Invalid input(s): FREET3 _____________  Dg Chest 2 View  Result Date: 12/26/2016 CLINICAL DATA:  Short of breath and weakness EXAM: CHEST  2 VIEW COMPARISON:  11/30/2015 FINDINGS: The heart is normal in size. Postop changes from sternotomy and CABG. Lungs are hyperaerated. Calcified granuloma at the right lung base. Mild patchy densities at the left base are worrisome for airspace disease. No pneumothorax. No pleural effusion. Dextroscoliosis in the midthoracic spine. IMPRESSION: Patchy root left basilar airspace disease. Electronically Signed   By: Marybelle Killings M.D.   On: 12/26/2016 16:03   Dg Chest Port 1 View  Result Date: 01/03/2017 CLINICAL DATA:  Status post transcatheter aortic valve replacement. History of asthma, coronary artery disease and  previous MI EXAM: PORTABLE CHEST 1 VIEW COMPARISON:  PA and lateral chest x-ray of December 28, 2016 FINDINGS: The lungs are well-expanded. The lung markings are coarse in the left infrahilar region and are slightly more conspicuous than on the previous study. There is no significant pleural effusion and there is no pneumothorax or pneumomediastinum. The heart and pulmonary vascularity are normal. The aortic valve cage appears to be in reasonable position. There are stable changes of previous CABG. There is calcification in the wall of the thoracic aorta. The right internal jugular venous catheter tip projects over the distal third of the SVC. IMPRESSION: No immediate postprocedure complication. Mild chronic bronchitic -reactive airway changes. Probable subsegmental atelectasis in the left infrahilar region. No CHF. A follow-up PA and lateral chest x-ray would be useful when the patient can tolerate the procedure. Thoracic aortic  atherosclerosis. Electronically Signed   By: David  Martinique M.D.   On: 01/03/2017 11:36   Disposition   Pt is being discharged home today in good condition.  Follow-up Plans & Appointments    Follow-up Information    Consuelo Pandy, PA-C Follow up on 01/12/2017.   Specialties:  Cardiology, Radiology Why:  at 11am for your follow up appt.  Contact information: [address] [place] [address] [phone]          Discharge Instructions    Call MD for:  redness, tenderness, or signs of infection (pain, swelling, redness, odor or green/yellow discharge around incision site)    Complete by:  As directed    Diet - low sodium heart healthy    Complete by:  As directed    Discharge instructions    Complete by:  As directed    ACTIVITY AND EXERCISE . Daily activity and exercise are an important part of your recovery. People recover at different rates depending on their general health and type of valve procedure. . Most people require six to 10 weeks to feel recovered. . No lifting, pushing, pulling more than 10 pounds (examples to avoid: groceries, vacuuming, gardening, golfing):  - For one week with a procedure through the groin.  - For six weeks for procedures through the chest wall.  - For three months for procedures through the breast-bone. NOTE: You will typically see one of our providers 7-10 days after your procedure to discuss [place] the above activities.    DRIVING . Do not drive for four weeks after the date of your procedure. . If you have been told by your doctor in the past that you may not drive, you must talk with him/her before you begin driving again. . When you resume driving, you must have someone with you.   DRESSING . Groin site: you may leave the clear dressing over the site for up to one week or until it falls off.   HYGIENE . If you had a femoral (leg) procedure, you may take a shower when you return home. After the shower,  pat the site dry. Do NOT use powder, oils or lotions in your groin area until the site has completely healed. . If you had a chest procedure, you may shower when you return home unless specifically instructed not to by your discharging practitioner.  - DO NOT scrub incision; pat dry with a towel  - DO NOT apply any lotions, oils, powders to the incision  - No tub baths / swimming for at least six weeks. . If you notice any fevers, chills,  increased pain, swelling, bleeding or pus, please contact your doctor.  ADDITIONAL INFORMATION . If you are going to have an upcoming dental procedure, please contact our office as you may require antibiotics ahead of time to prevent infection on your heart valve.    Please resume your home Eliquis. We also switched your valsartan to Avapro as valsartan is on recall at this time. Please call the office with any questions.   Increase activity slowly    Complete by:  As directed       Discharge Medications   Current Discharge Medication List    START taking these medications   Details  apixaban (ELIQUIS) 2.5 MG TABS tablet Take 1 tablet (2.5 mg total) by mouth 2 (two) times daily. Qty: 60 tablet, Refills: 3    irbesartan (AVAPRO) 75 MG tablet Take 1 tablet (75 mg total) by mouth daily. Qty: 90 tablet, Refills: 1      CONTINUE these medications which have NOT CHANGED   Details  acetaminophen (TYLENOL) 650 MG CR tablet Take 1,300 mg by mouth 2 (two) times daily as needed for pain.     calcium-vitamin D (OSCAL WITH D) 500-200 MG-UNIT per tablet Take 1 tablet by mouth daily with breakfast.    cholecalciferol (VITAMIN D) 1000 UNITS tablet Take 1,000 Units by mouth daily.    diltiazem (CARDIZEM CD) 180 MG 24 hr capsule TAKE 1 CAPSULE BY MOUTH DAILY Qty: 30 capsule, Refills: 11    FLOVENT HFA 110 MCG/ACT inhaler inhale 1 puff by mouth twice a day Qty: 12 g, Refills: 1    fluticasone (FLONASE) 50 MCG/ACT nasal spray instill 2 sprays into each  nostril once daily Qty: 16 g, Refills: 3    guaiFENesin (MUCINEX) 600 MG 12 hr tablet Take 1 tablet (600 mg total) by mouth 2 (two) times daily. Qty: 30 tablet, Refills: 0    guaiFENesin (ROBITUSSIN) 100 MG/5ML liquid Take 100 mg by mouth 3 (three) times daily as needed for cough. Reported on 12/08/2015    HYDROcodone-acetaminophen (HYCET) 7.5-325 mg/15 ml solution Take 10-15 ML's by mouth every 6 hours as needed for moderate to severe pain. Qty: 180 mL, Refills: 0    isosorbide dinitrate (ISORDIL) 30 MG tablet TAKE 1 TABLET BY MOUTH 2 TIMES DAILY Qty: 60 tablet, Refills: 7    loratadine (CLARITIN) 10 MG tablet Take 10 mg by mouth daily.      metoprolol tartrate (LOPRESSOR) 25 MG tablet take 1/2 tablet by mouth twice a day Qty: 60 tablet, Refills: 11    nitroGLYCERIN (NITROSTAT) 0.4 MG SL tablet place 1 tablet UNDER THE TONGUE EVERY 5 MINUTES if needed for chest pain MAX OF 3 TABS Qty: 25 tablet, Refills: 6    pantoprazole (PROTONIX) 40 MG tablet Take 1 tablet (40 mg total) by mouth 2 (two) times daily. Qty: 180 tablet, Refills: 3    rosuvastatin (CRESTOR) 40 MG tablet take 1 tablet by mouth once daily Qty: 90 tablet, Refills: 1    VENTOLIN HFA 108 (90 Base) MCG/ACT inhaler INHALE 2 PUFFS BY MOUTH EVERY 6 HOURS AS NEEDED FOR WHEEZING OR SHORTNESS OF BREATH Qty: 18 Inhaler, Refills: 5      STOP taking these medications     valsartan (DIOVAN) 40 MG tablet           Outstanding Labs/Studies   Follow up echo in one month.   Duration of Discharge Encounter   Greater than 30 minutes including physician time.  Signed, Reino Bellis NP-C 01/05/2017,  11:54 AM

## 2017-01-03 NOTE — Interval H&P Note (Signed)
History and Physical Interval Note:  01/03/2017 5:39 AM  [name]  has presented today for surgery, with the diagnosis of SEVERE AS  The various methods of treatment have been discussed with the patient and family. After consideration of risks, benefits and other options for treatment, the patient has consented to  Procedure(s): TRANSCATHETER AORTIC VALVE REPLACEMENT, TRANSFEMORAL (N/A) possible TRANSCATHETER AORTIC VALVE REPLACEMENT, TRANSAPICAL (N/A) TRANSESOPHAGEAL ECHOCARDIOGRAM (TEE) (N/A) as a surgical intervention .  The patient's history has been reviewed, patient examined, no change in status, stable for surgery.  I have reviewed the patient's chart and labs.  Questions were answered to the patient's satisfaction.     Gaye Pollack

## 2017-01-03 NOTE — CV Procedure (Signed)
[facility]  TAVR OPERATIVE NOTE   Date of Procedure:  01/03/2017  Preoperative Diagnosis: Severe Aortic Stenosis   Postoperative Diagnosis: Same   Procedure:    Transcatheter Aortic Valve Replacement - Transfemoral Approach  Edwards Sapien 3 THV (size 23 mm, model # [id number], serial # [id number])   Co-Surgeons:  Gilford Raid, MD and Lauree Chandler, MD  Anesthesiologist:  Ola Spurr  Echocardiographer:  Johnsie Cancel  Pre-operative Echo Findings:  Severe aortic stenosis  Normal left ventricular systolic function  Post-operative Echo Findings:  No paravalvular leak  Normal left ventricular systolic function  BRIEF CLINICAL NOTE AND INDICATIONS FOR SURGERY  83 yo female with history of CAD s/p CABG, atrial fib s/p MAZE procedure, CVA, carotid artery disease s/p right CEA, fibromyalgia, GERD, mild to moderate MR, severe AS who is here today for TAVR. She underwent CABG in 2006 with MAZE procedure. She is known to have moderate carotid disease. She was found to have severe AS by echo in July 2017 along with moderate MR. We discussed her aortic valve disease at that time but she did not wish to pursue further evaluation. She is known to have atrial fibrillation but has refused anticoagulation due to hematuria when on Eliquis in 2015. Nuclear stress test at [facility] June 2016 during admission for chest pain did not show ischemia. I saw her in May 2018 and she c/o dizziness, blurred vision, chest pain and dyspnea. Echo 10/14/16 with normal LV systolic function, mild mitral regurgitation and severe aortic stenosis (mean gradient 54 mm Hg peak gradient 101 mmHg). She has undergone cardiac cath with 3/4 patent bypass grafts. Carotid stenosis is still moderate. She has had progressive dyspnea and chest pain felt to be due to her AS.   During the course of the patient's preoperative work up they have been evaluated comprehensively by a multidisciplinary team of specialists  coordinated through the [facility] in the [facility].  They have been demonstrated to suffer from symptomatic severe aortic stenosis as noted above. The patient has been counseled extensively as to the relative risks and benefits of all options for the treatment of severe aortic stenosis including long term medical therapy, conventional surgery for aortic valve replacement, and transcatheter aortic valve replacement.  The patient has been independently evaluated by two cardiac surgeons including Dr Prescott Gum and Dr. Cyndia Bent and they are felt to be at high risk for conventional surgical aortic valve replacement. Both surgeons indicated the patient would be a poor candidate for conventional surgery. Based upon review of all of the patient's preoperative diagnostic tests they are felt to be candidate for transcatheter aortic valve replacement using the transfemoral approach as an alternative to high risk conventional surgery.    Following the decision to proceed with transcatheter aortic valve replacement, a discussion has been held regarding what types of management strategies would be attempted intraoperatively in the event of life-threatening complications, including whether or not the patient would be considered a candidate for the use of cardiopulmonary bypass and/or conversion to open sternotomy for attempted surgical intervention.  The patient has been advised of a variety of complications that might develop peculiar to this approach including but not limited to risks of death, stroke, paravalvular leak, aortic dissection or other major vascular complications, aortic annulus rupture, device embolization, cardiac rupture or perforation, acute myocardial infarction, arrhythmia, heart block or bradycardia requiring permanent pacemaker placement, congestive heart failure, respiratory failure, renal failure, pneumonia, infection, other late complications related  to structural valve deterioration or migration, or other complications that might ultimately cause a temporary or permanent loss of functional independence or other long term morbidity.  The patient provides full informed consent for the procedure as described and all questions were answered preoperatively.    DETAILS OF THE OPERATIVE PROCEDURE  PREPARATION:   The patient is brought to the operating room on the above mentioned date and central monitoring was established by the anesthesia team including placement of a radial arterial line. The patient is placed in the supine position on the operating table.  Intravenous antibiotics are administered. Conscious sedation is used. A Foley catheter is placed.  Baseline transthoracic echocardiogram was performed. The patient's chest, abdomen, both groins, and both lower extremities are prepared and draped in a sterile manner. A time out procedure is performed.   PERIPHERAL ACCESS:   Using the modified Seldinger technique, femoral arterial and venous access were obtained with placement of 6 Fr sheaths on the left side.  A pigtail diagnostic catheter was passed through the femoral arterial sheath under fluoroscopic guidance into the aortic root.  A temporary transvenous pacemaker catheter was passed through the femoral venous sheath under fluoroscopic guidance into the right ventricle.  The pacemaker was tested to ensure stable lead placement and pacemaker capture. Aortic root angiography was performed in order to determine the optimal angiographic angle for valve deployment.  TRANSFEMORAL ACCESS:  A micropuncture kit was used to access the right femoral artery under u/s guidance. Angiography confirmed the location of the arterial entry point above the bifurcation. Pre-closure with double ProGlide closure devices. The patient was heparinized systemically and ACT verified > 250 seconds.    A 14 Fr transfemoral E-sheath was introduced into the right femoral  artery after progressively dilating over an Amplatz superstiff wire. An AL-1 catheter was used to direct a straight-tip exchange length wire across the native aortic valve into the left ventricle. This was exchanged out for a pigtail catheter and position was confirmed in the LV apex. The pigtail catheter was then exchanged for an Amplatz Extra-stiff wire in the LV apex.   TRANSCATHETER HEART VALVE DEPLOYMENT:  An Edwards Sapien 3 THV (size 23 mm) was prepared and crimped per manufacturer's guidelines, and the proper orientation of the valve is confirmed on the [employer] delivery system. The valve was advanced through the introducer sheath using normal technique until in an appropriate position in the abdominal aorta beyond the sheath tip. The balloon was then retracted and using the fine-tuning wheel was centered on the valve. The valve was then advanced across the aortic arch using appropriate flexion of the catheter. The valve was carefully positioned across the aortic valve annulus. The Commander catheter was retracted using normal technique. Once final position of the valve has been confirmed by angiographic assessment, the valve is deployed while temporarily holding ventilation and during rapid ventricular pacing to maintain systolic blood pressure < 50 mmHg and pulse pressure < 10 mmHg. The balloon inflation is held for >3 seconds after reaching full deployment volume. Once the balloon has fully deflated the balloon is retracted into the ascending aorta and valve function is assessed using TEE. There is felt to be no paravalvular leak and no central aortic insufficiency.  The patient's hemodynamic recovery following valve deployment is good.  The deployment balloon and guidewire are both removed. Echo demostrated acceptable post-procedural gradients, stable mitral valve function, and no AI.    PROCEDURE COMPLETION:  The sheath was removed and the closure device  was completed. Protamine was  administered once femoral arterial repair was complete. The temporary pacemaker, pigtail catheters and femoral sheaths were removed with manual pressure used for hemostasis.   The patient tolerated the procedure well and is transported to the CV intensive care in stable condition. There were no immediate intraoperative complications. All sponge instrument and needle counts are verified correct at completion of the operation.   No blood products were administered during the operation.  The patient received a total of 85.8 mL of intravenous contrast during the procedure.  Lauree Chandler MD 01/03/2017 8:06 AM

## 2017-01-03 NOTE — Anesthesia Postprocedure Evaluation (Signed)
Anesthesia Post Note  Patient: [name]  Procedure(s) Performed: Procedure(s) (LRB): TRANSCATHETER AORTIC VALVE REPLACEMENT, TRANSFEMORAL (N/A) TRANSESOPHAGEAL ECHOCARDIOGRAM (TEE) (N/A)     Patient location during evaluation: SICU Anesthesia Type: MAC Level of consciousness: awake and alert Pain management: pain level controlled Vital Signs Assessment: post-procedure vital signs reviewed and stable Respiratory status: spontaneous breathing, nonlabored ventilation, respiratory function stable and patient connected to nasal cannula oxygen Cardiovascular status: stable and blood pressure returned to baseline Anesthetic complications: no    Last Vitals:  Vitals:   01/03/17 1115 01/03/17 1300  BP: (!) 106/43   Pulse: (!) 54   Resp: 16   Temp:  36.6 C  SpO2: 100%     Last Pain:  Vitals:   01/03/17 1300  TempSrc: Oral                 Tiajuana Amass

## 2017-01-03 NOTE — Op Note (Signed)
[facility]   MULTIDISCIPLINARY HEART VALVE TEAM   TAVR OPERATIVE NOTE   Date of Procedure:  01/03/2017  Preoperative Diagnosis: Severe Aortic Stenosis   Postoperative Diagnosis: Same   Procedure:    Transcatheter Aortic Valve Replacement - Percutaneous right Transfemoral Approach  Edwards Sapien 3 THV (size 23 mm, model # 9600TFX, serial # [id number])   Co-Surgeons: Gaye Pollack, MD and Lauree Chandler, MD   Anesthesiologist:  Suzette Battiest, MD  Echocardiographer:  Jenkins Rouge, MD  Pre-operative Echo Findings:  Severe aortic stenosis  Normal left ventricular systolic function  Post-operative Echo Findings:  no paravalvular leak  normal left ventricular systolic function   BRIEF CLINICAL NOTE AND INDICATIONS FOR SURGERY  The patient is an 83 year old woman with hypertension, hyperlipidemia, atrial fibrillation and CAD s/p CABG and MAZE in 2006 by Dr. Prescott Gum. She was found to have severe AS by cho in 11/2015 with a mean gradient of 47 mm Hg. She did not want to pursue further evaluation at that time. She has had atrial fibrillation bu refused anticoagulation due to hematuria on Eliquis in 2015. Her most recent echo on 10/14/2016 showed progression of her AS with an increase in the mean gradient to 54 mm Hg with a peak of 101. The LVEF was 60-65% with grade 2 diastolic dysfunction. Cardiac cath on 10/27/2016 showed severe native 3 vessel CAD with 3/4 patent bypass grafts. The mean AV gradient was 41.5 mm Hg with a peak to peak of 41 mm Hg. She is not very active due to difficulty with balance requiring a walker but reports exertional dyspnea with minimal activity. She has had some chest pain with exertion and dizziness with blurred vision. She feels like this has worsened over the past 6 months.  A filling defect was seen at the tip of the left atrial appendage on CTA of the chest concerning for possible left atrial thrombus. After discussion with the  team she was started on Eliquis for a month preop.  She has been evaluated by Dr. Enrique Sack and underwent multiple extractions on 12/28/2016  She has stage D severe symptomatic aortic stenosis with NYHA class III symptoms of fatigue and shortness of breath with minimal exertion. She has also had exertional chest pressure and dizziness. This is consistent with chronic diastolic heart failure. Her symptoms have progressed over the past 6 months and she is very limited in her activity at this time. I have personally reviewed her echo, cath, and CT studies. Her echo shows severe AS with a trileaflet aortic valve with severe leaflet calcification and a mean gradient of 54 mm Hg. I agree that AVR is indicated in this patient who has remained independent. I don't think she is a candidate for open surgical AVR due to her age and frailty but TAVR is a reasonable alternative. Cardiac cath shows 3/4 patent bypass grafts and no significant areas of potential ischemia. Her cardiac CT shows anatomy favorable for a Sapien 3 valve. Her abdominal and pelvic CT shows severe aorto-iliac vascular disease but I think access may be adequate on the right for transfemoral insertion. The left side does not appear adequate for valve insertion but is adequate for diagnostic catheters. If right transfemoral access is not adequate then transapical insertion is probably the best option.   The patient and her daughter werecounseled at length regarding treatment alternatives for management of severe symptomatic aortic stenosis. The risks and benefits of surgical intervention has been discussed in detail. Long-term  prognosis with medical therapy was discussed. Alternative approaches such as conventional surgical aortic valve replacement, transcatheter aortic valve replacement, and palliative medical therapy were compared and contrasted at length. This discussion was placed in the context of the patient's own specific clinical presentation  and past medical history. All of their questions been addressed. Following the decision to proceed with transcatheter aortic valve replacement, a discussion was held regarding what types of management strategies would be attempted intraoperatively in the event of life-threatening complications, including whether or not the patient would be considered a candidate for the use of cardiopulmonary bypass and/or conversion to open sternotomy for attempted surgical intervention.   The patient has been advised of a variety of complications that might develop including but not limited to risks of death, stroke, paravalvular leak, aortic dissection or other major vascular complications, aortic annulus rupture, device embolization, cardiac rupture or perforation, mitral regurgitation, acute myocardial infarction, arrhythmia, heart block or bradycardia requiring permanent pacemaker placement, congestive heart failure, respiratory failure, renal failure, pneumonia, infection, other late complications related to structural valve deterioration or migration, or other complications that might ultimately cause a temporary or permanent loss of functional independence or other long term morbidity. The patient provides full informed consent for the procedure as described and all questions were answered.     DETAILS OF THE OPERATIVE PROCEDURE  PREPARATION:    The patient is brought to the operating room on the above mentioned date and central monitoring was established by the anesthesia team including placement of a central venous line and radial arterial line. The patient is placed in the supine position on the operating table.  Intravenous antibiotics are administered. The patient is monitored closely throughout the procedure under conscious sedation. A Foley catheter is placed.  Baseline transthoracic echocardiogram was performed. The patient's chest, abdomen, both groins, and both lower extremities are prepared and  draped in a sterile manner. A time out procedure is performed.   PERIPHERAL ACCESS:    Using the modified Seldinger technique, femoral arterial and venous access was obtained with placement of 6 Fr sheaths on the left side.  A pigtail diagnostic catheter was passed through the left arterial sheath under fluoroscopic guidance into the aortic root.  A temporary transvenous pacemaker catheter was passed through the left femoral venous sheath under fluoroscopic guidance into the right ventricle.  The pacemaker was tested to ensure stable lead placement and pacemaker capture. Aortic root angiography was performed in order to determine the optimal angiographic angle for valve deployment.   TRANSFEMORAL ACCESS:   Percutaneous transfemoral access and sheath placement was performed by Dr. Angelena Form using ultrasound guidance.  The right common femoral artery was cannulated using a micropuncture needle and appropriate location was verified using hand injection angiogram.  A pair of Abbott Perclose percutaneous closure devices were placed and a 6 French sheath replaced into the femoral artery.  The patient was heparinized systemically and ACT verified > 250 seconds.    A 14 Fr transfemoral E-sheath was introduced into the right femoral artery after progressively dilating over an Amplatz superstiff wire. An AL-1 catheter was used to direct a straight-tip exchange length wire across the native aortic valve into the left ventricle. This was exchanged out for a pigtail catheter and position was confirmed in the LV apex. Simultaneous LV and Ao pressures were recorded.  The pigtail catheter was exchanged for an Amplatz Extra-stiff wire in the LV apex.  Echocardiography was utilized to confirm appropriate wire position and no sign of entanglement  in the mitral subvalvular apparatus.   BALLOON AORTIC VALVULOPLASTY:   Not performed   TRANSCATHETER HEART VALVE DEPLOYMENT:   An Edwards Sapien 3 transcatheter heart  valve (size 23 mm, model #9600TFX, serial #[id number]) was prepared and crimped per manufacturer's guidelines, and the proper orientation of the valve is confirmed on the [employer] delivery system. The valve was advanced through the introducer sheath using normal technique until in an appropriate position in the abdominal aorta beyond the sheath tip. The balloon was then retracted and using the fine-tuning wheel was centered on the valve. The valve was then advanced across the aortic arch using appropriate flexion of the catheter. The valve was carefully positioned across the aortic valve annulus. The Commander catheter was retracted using normal technique. Once final position of the valve has been confirmed by angiographic assessment, the valve is deployed while temporarily holding ventilation and during rapid ventricular pacing to maintain systolic blood pressure < 50 mmHg and pulse pressure < 10 mmHg. The balloon inflation is held for >3 seconds after reaching full deployment volume. Once the balloon has fully deflated the balloon is retracted into the ascending aorta and valve function is assessed using echocardiography. There is felt to be no paravalvular leak and no central aortic insufficiency.  The patient's hemodynamic recovery following valve deployment is good.  The deployment balloon and guidewire are both removed.    PROCEDURE COMPLETION:   The sheath was removed and femoral artery closure performed by Dr Angelena Form.  Protamine was administered once femoral arterial repair was complete. The temporary pacemaker, pigtail catheters and femoral sheaths were removed with manual pressure used for hemostasis.   The patient tolerated the procedure well and is transported to the surgical intensive care in stable condition. There were no immediate intraoperative complications. All sponge instrument and needle counts are verified correct at completion of the operation.   No blood products were  administered during the operation.  The patient received a total of 85.8 mL of intravenous contrast during the procedure.   Gaye Pollack, MD 01/03/2017

## 2017-01-03 NOTE — Transfer of Care (Signed)
Immediate Anesthesia Transfer of Care Note  Patient: [name]  Procedure(s) Performed: Procedure(s): TRANSCATHETER AORTIC VALVE REPLACEMENT, TRANSFEMORAL (N/A) TRANSESOPHAGEAL ECHOCARDIOGRAM (TEE) (N/A)  Patient Location: ICU  Anesthesia Type:MAC  Level of Consciousness: drowsy and patient cooperative  Airway & Oxygen Therapy: Patient Spontanous Breathing and Patient connected to face mask oxygen  Post-op Assessment: Report given to RN and Post -op Vital signs reviewed and stable  Post vital signs: Reviewed and stable  Last Vitals:  Vitals:   01/03/17 0729 01/03/17 0730  BP:    Pulse: 63 61  Resp: (!) 28 18  Temp:    SpO2: 100% 100%    Last Pain:  Vitals:   01/03/17 0553  TempSrc: Oral      Patients Stated Pain Goal: 2 (82/50/03 7048)  Complications: No apparent anesthesia complications

## 2017-01-04 ENCOUNTER — Encounter (HOSPITAL_COMMUNITY): Payer: Self-pay | Admitting: Cardiovascular Disease

## 2017-01-04 ENCOUNTER — Inpatient Hospital Stay (HOSPITAL_COMMUNITY): Payer: Medicare Other

## 2017-01-04 DIAGNOSIS — I447 Left bundle-branch block, unspecified: Secondary | ICD-10-CM

## 2017-01-04 DIAGNOSIS — I480 Paroxysmal atrial fibrillation: Secondary | ICD-10-CM

## 2017-01-04 DIAGNOSIS — I2510 Atherosclerotic heart disease of native coronary artery without angina pectoris: Secondary | ICD-10-CM

## 2017-01-04 DIAGNOSIS — Z954 Presence of other heart-valve replacement: Secondary | ICD-10-CM

## 2017-01-04 DIAGNOSIS — Z953 Presence of xenogenic heart valve: Secondary | ICD-10-CM

## 2017-01-04 DIAGNOSIS — I10 Essential (primary) hypertension: Secondary | ICD-10-CM

## 2017-01-04 DIAGNOSIS — I350 Nonrheumatic aortic (valve) stenosis: Secondary | ICD-10-CM

## 2017-01-04 LAB — CBC
HEMATOCRIT: 30.9 % — AB (ref 36.0–46.0)
HEMOGLOBIN: 10 g/dL — AB (ref 12.0–15.0)
MCH: 29.7 pg (ref 26.0–34.0)
MCHC: 32.4 g/dL (ref 30.0–36.0)
MCV: 91.7 fL (ref 78.0–100.0)
Platelets: 184 10*3/uL (ref 150–400)
RBC: 3.37 MIL/uL — ABNORMAL LOW (ref 3.87–5.11)
RDW: 14.2 % (ref 11.5–15.5)
WBC: 8.4 10*3/uL (ref 4.0–10.5)

## 2017-01-04 LAB — ECHOCARDIOGRAM COMPLETE
HEIGHTINCHES: 65 in
WEIGHTICAEL: 1940.05 [oz_av]

## 2017-01-04 LAB — BASIC METABOLIC PANEL
Anion gap: 10 (ref 5–15)
BUN: 8 mg/dL (ref 6–20)
CHLORIDE: 106 mmol/L (ref 101–111)
CO2: 23 mmol/L (ref 22–32)
CREATININE: 0.5 mg/dL (ref 0.44–1.00)
Calcium: 8.9 mg/dL (ref 8.9–10.3)
GFR calc Af Amer: >60 mL/min (ref >60)
GFR calc non Af Amer: >60 mL/min (ref >60)
Glucose, Bld: 129 mg/dL — ABNORMAL HIGH (ref 65–99)
Potassium: 4 mmol/L (ref 3.5–5.1)
SODIUM: 139 mmol/L (ref 135–145)

## 2017-01-04 LAB — MAGNESIUM: Magnesium: 2.3 mg/dL (ref 1.7–2.4)

## 2017-01-04 MED ORDER — DEXTROSE 5 % IV SOLN
1.5000 g | Freq: Two times a day (BID) | INTRAVENOUS | Status: AC
  Administered 2017-01-04 – 2017-01-05 (×2): 1.5 g via INTRAVENOUS
  Filled 2017-01-04 (×3): qty 1.5

## 2017-01-04 NOTE — Progress Notes (Signed)
  Echocardiogram 2D Echocardiogram has been performed.  Rayneisha Bouza 01/04/2017, 11:38 AM

## 2017-01-04 NOTE — Progress Notes (Signed)
Report called to [place] on 4E.  IV team to replace IV when patient gets to 4E.

## 2017-01-04 NOTE — Progress Notes (Addendum)
Progress Note  Patient Name: [name] Date of Encounter: 01/04/2017  Primary Cardiologist: Angelena Form  Subjective   No chest pain or dyspnea. She is c/o the SCDs being tight, no sleep and Medicare.   Inpatient Medications    Scheduled Meds: . acetaminophen  1,000 mg Oral Q6H   Or  . acetaminophen (TYLENOL) oral liquid 160 mg/5 mL  1,000 mg Per Tube Q6H  . Chlorhexidine Gluconate Cloth  6 each Topical Daily  . feeding supplement (ENSURE ENLIVE)  237 mL Oral BID BM  . fluticasone  1 spray Each Nare Daily  . guaiFENesin  600 mg Oral BID  . irbesartan  75 mg Oral Daily  . isosorbide dinitrate  30 mg Oral BID  . loratadine  10 mg Oral Daily  . pantoprazole  40 mg Oral BID  . rosuvastatin  40 mg Oral QPM   Continuous Infusions: . albumin human    . cefUROXime (ZINACEF)  IV Stopped (01/04/17 0536)  . DOPamine Stopped (01/03/17 2015)  . lactated ringers     PRN Meds: albumin human, lactated ringers, midazolam, morphine injection, ondansetron (ZOFRAN) IV, oxyCODONE, sodium chloride flush, traMADol   Vital Signs    Vitals:   01/04/17 0300 01/04/17 0400 01/04/17 0500 01/04/17 0600  BP: (!) 130/56 134/86 (!) 128/91 (!) 134/56  Pulse: 81 84 82 74  Resp: (!) 23 (!) 24 (!) 28 (!) 25  Temp: 98.5 F (36.9 C)     TempSrc: Oral     SpO2: 97% 96% 97% 97%  Weight: 121 lb 4.1 oz (55 kg)     Height:        Intake/Output Summary (Last 24 hours) at 01/04/17 0655 Last data filed at 01/04/17 0600  Gross per 24 hour  Intake           2024.5 ml  Output             2570 ml  Net           -545.5 ml   Filed Weights   01/03/17 1045 01/04/17 0300  Weight: 125 lb 3.5 oz (56.8 kg) 121 lb 4.1 oz (55 kg)    Telemetry    sinus - Personally Reviewed  ECG     - Personally Reviewed  Physical Exam   GEN: No acute distress.   Neck: No JVD Cardiac: RRR with soft systolic murmur. No rubs, or gallops.  Respiratory: Clear to auscultation bilaterally. GI: Soft, nontender,  non-distended  Ext: No edema; No deformity. Neuro:  Nonfocal  Psych: Normal affect   Labs    Chemistry Recent Labs Lab 01/03/17 0924 01/03/17 1001 01/03/17 1024 01/04/17 0500  NA 142 142 142 139  K 3.8 3.7 3.7 4.0  CL 104 104  --  106  CO2  --   --   --  23  GLUCOSE 162* 165* 156* 129*  BUN 9 9  --  8  CREATININE 0.30* 0.30*  --  0.50  CALCIUM  --   --   --  8.9  GFRNONAA  --   --   --  >60  GFRAA  --   --   --  >60  ANIONGAP  --   --   --  10     Hematology Recent Labs Lab 12/29/16 0329  01/03/17 1001 01/03/17 1024 01/04/17 0500  WBC 7.6  --   --  6.4 8.4  RBC 3.60*  --   --  3.02* 3.37*  HGB  10.8*  < > 8.2* 8.5*  9.1* 10.0*  HCT 33.8*  < > 24.0* 25.0*  28.2* 30.9*  MCV 93.9  --   --  93.4 91.7  MCH 30.0  --   --  30.1 29.7  MCHC 32.0  --   --  32.3 32.4  RDW 14.7  --   --  14.7 14.2  PLT 183  --   --  147* 184  < > = values in this interval not displayed.    Radiology    Dg Chest Port 1 View  Result Date: 01/03/2017 CLINICAL DATA:  Status post transcatheter aortic valve replacement. History of asthma, coronary artery disease and previous MI EXAM: PORTABLE CHEST 1 VIEW COMPARISON:  PA and lateral chest x-ray of December 28, 2016 FINDINGS: The lungs are well-expanded. The lung markings are coarse in the left infrahilar region and are slightly more conspicuous than on the previous study. There is no significant pleural effusion and there is no pneumothorax or pneumomediastinum. The heart and pulmonary vascularity are normal. The aortic valve cage appears to be in reasonable position. There are stable changes of previous CABG. There is calcification in the wall of the thoracic aorta. The right internal jugular venous catheter tip projects over the distal third of the SVC. IMPRESSION: No immediate postprocedure complication. Mild chronic bronchitic -reactive airway changes. Probable subsegmental atelectasis in the left infrahilar region. No CHF. A follow-up PA and  lateral chest x-ray would be useful when the patient can tolerate the procedure. Thoracic aortic atherosclerosis. Electronically Signed   By: David  Martinique M.D.   On: 01/03/2017 11:36    Cardiac Studies     Patient Profile     83 y.o. female with atrial fib, CAD s/p CABG, severe aortic valve stenosis admitted following TAVR 01/03/17.   Assessment & Plan    1. Severe aortic valve stenosis: She is now one day post TAVR from the right transfemoral approach with placement of a 23 mm Edwards Sapien 3 valve. She is doing well this am. Pt has ASA allergy. Plan on restarting Eliquis tomorrow if groin sites stable. Echo later today to assess valve.   2. Atrial fibrillation, paroxysmal: sinus today. Bradycardia resolved. Will hold beta blocker and Cardizem today and will restart tomorrow if HR stable. Restart Eliquis tomorrow.   3. CAD s/p CABG: No chest pain.   D/c arterial line, central line and foley. Transfer to telemetry unit.   Signed, Lauree Chandler, MD  01/04/2017, 6:55 AM

## 2017-01-04 NOTE — Care Management Note (Signed)
Case Management Note Marvetta Gibbons RN, BSN Unit 4E-Case Manager-- [place] coverage [phone]  Patient Details  Name: [name] MRN: [id number] Date of Birth: 04/20/1929  Subjective/Objective:    Pt admitted s/p TAVR on 01/03/17                Action/Plan: PTA pt lived at home- plan to return home with daughter, - CM to follow for d/c needs.   Expected Discharge Date:                  Expected Discharge Plan:  Home/Self Care  In-House Referral:     Discharge planning Services  CM Consult  Post Acute Care Choice:    Choice offered to:     DME Arranged:    DME Agency:     HH Arranged:    HH Agency:     Status of Service:  In process, will continue to follow  If discussed at Long Length of Stay Meetings, dates discussed:    Discharge Disposition:   Additional Comments:  Dawayne Patricia, RN 01/04/2017, 10:19 AM

## 2017-01-04 NOTE — Progress Notes (Signed)
1 Day Post-Op Procedure(s) (LRB): TRANSCATHETER AORTIC VALVE REPLACEMENT, TRANSFEMORAL (N/A) TRANSESOPHAGEAL ECHOCARDIOGRAM (TEE) (N/A) Subjective: No specific complaints  Objective: Vital signs in last 24 hours: Temp:  [97.8 F (36.6 C)-98.6 F (37 C)] 98.5 F (36.9 C) (08/15 0300) Pulse Rate:  [51-86] 86 (08/15 0700) Cardiac Rhythm: Heart block (08/14 2000) Resp:  [15-29] 25 (08/15 0700) BP: (100-143)/(32-99) 143/60 (08/15 0700) SpO2:  [96 %-100 %] 98 % (08/15 0700) Arterial Line BP: (95-169)/(29-55) 169/55 (08/15 0700) Weight:  [55 kg (121 lb 4.1 oz)-56.8 kg (125 lb 3.5 oz)] 55 kg (121 lb 4.1 oz) (08/15 0300)  Hemodynamic parameters for last 24 hours:    Intake/Output from previous day: 08/14 0701 - 08/15 0700 In: 2024.5 [P.O.:480; I.V.:1144.5; IV Piggyback:300] Out: 2570 [Urine:2545; Blood:25] Intake/Output this shift: No intake/output data recorded.  General appearance: alert and cooperative Neurologic: intact Heart: regular rate and rhythm, S1, S2 normal, slight systolic flow murmur Lungs: clear to auscultation bilaterally Extremities: extremities normal, atraumatic, no cyanosis or edema Wound: groin access sites ok  Lab Results:  Recent Labs  01/03/17 1024 01/04/17 0500  WBC 6.4 8.4  HGB 8.5*  9.1* 10.0*  HCT 25.0*  28.2* 30.9*  PLT 147* 184   BMET:  Recent Labs  01/03/17 1001 01/03/17 1024 01/04/17 0500  NA 142 142 139  K 3.7 3.7 4.0  CL 104  --  106  CO2  --   --  23  GLUCOSE 165* 156* 129*  BUN 9  --  8  CREATININE 0.30*  --  0.50  CALCIUM  --   --  8.9    PT/INR:  Recent Labs  01/03/17 1024  LABPROT 16.4*  INR 1.31   ABG    Component Value Date/Time   PHART 7.442 01/03/2017 1029   HCO3 29.5 (H) 01/03/2017 1029   TCO2 31 01/03/2017 1029   O2SAT 100.0 01/03/2017 1029   CBG (last 3)  No results for input(s): GLUCAP in the last 72 hours.  Assessment/Plan: S/P Procedure(s) (LRB): TRANSCATHETER AORTIC VALVE REPLACEMENT,  TRANSFEMORAL (N/A) TRANSESOPHAGEAL ECHOCARDIOGRAM (TEE) (N/A)  POD 1 doing well  2D echo today  Resume Eliquis tomorrow for PAF  DC arterial line, central line and foley  Transfer to 4E and plan home tomorrow with her daughter.   LOS: 1 day    Gaye Pollack 01/04/2017

## 2017-01-05 ENCOUNTER — Other Ambulatory Visit: Payer: Self-pay | Admitting: *Deleted

## 2017-01-05 DIAGNOSIS — I350 Nonrheumatic aortic (valve) stenosis: Secondary | ICD-10-CM

## 2017-01-05 DIAGNOSIS — E876 Hypokalemia: Secondary | ICD-10-CM

## 2017-01-05 LAB — CBC
HCT: 32.9 % — ABNORMAL LOW (ref 36.0–46.0)
HCT: 30.4 % — ABNORMAL LOW (ref 36.0–46.0)
Hemoglobin: 10.5 g/dL — ABNORMAL LOW (ref 12.0–15.0)
Hemoglobin: 9.7 g/dL — ABNORMAL LOW (ref 12.0–15.0)
MCH: 29.5 pg (ref 26.0–34.0)
MCH: 29.7 pg (ref 26.0–34.0)
MCHC: 31.9 g/dL (ref 30.0–36.0)
MCHC: 31.9 g/dL (ref 30.0–36.0)
MCV: 92.9 fL (ref 78.0–100.0)
MCV: 92.4 fL (ref 78.0–100.0)
PLATELETS: 187 10*3/uL (ref 150–400)
Platelets: 187 10*3/uL (ref 150–400)
RBC: 3.54 MIL/uL — ABNORMAL LOW (ref 3.87–5.11)
RBC: 3.29 MIL/uL — AB (ref 3.87–5.11)
RDW: 14.5 % (ref 11.5–15.5)
RDW: 14.5 % (ref 11.5–15.5)
WBC: 10.2 10*3/uL (ref 4.0–10.5)
WBC: 7.3 10*3/uL (ref 4.0–10.5)

## 2017-01-05 LAB — BASIC METABOLIC PANEL
Anion gap: 9 (ref 5–15)
Anion gap: 5 (ref 5–15)
BUN: 11 mg/dL (ref 6–20)
BUN: 11 mg/dL (ref 6–20)
CALCIUM: 8.9 mg/dL (ref 8.9–10.3)
CHLORIDE: 109 mmol/L (ref 101–111)
CO2: 25 mmol/L (ref 22–32)
CO2: 27 mmol/L (ref 22–32)
CREATININE: 0.46 mg/dL (ref 0.44–1.00)
CREATININE: 0.54 mg/dL (ref 0.44–1.00)
Calcium: 8.7 mg/dL — ABNORMAL LOW (ref 8.9–10.3)
Chloride: 107 mmol/L (ref 101–111)
GFR calc Af Amer: >60 mL/min (ref >60)
GFR calc non Af Amer: >60 mL/min (ref >60)
GLUCOSE: 111 mg/dL — AB (ref 65–99)
Glucose, Bld: 133 mg/dL — ABNORMAL HIGH (ref 65–99)
POTASSIUM: 3.3 mmol/L — AB (ref 3.5–5.1)
Potassium: 3.5 mmol/L (ref 3.5–5.1)
SODIUM: 141 mmol/L (ref 135–145)
Sodium: 141 mmol/L (ref 135–145)

## 2017-01-05 MED ORDER — POTASSIUM CHLORIDE 20 MEQ PO PACK
60.0000 meq | PACK | Freq: Once | ORAL | Status: AC
  Administered 2017-01-05: 60 meq via ORAL
  Filled 2017-01-05: qty 3

## 2017-01-05 MED ORDER — DILTIAZEM HCL ER COATED BEADS 180 MG PO CP24
180.0000 mg | ORAL_CAPSULE | Freq: Every day | ORAL | Status: DC
  Administered 2017-01-05: 180 mg via ORAL
  Filled 2017-01-05: qty 1

## 2017-01-05 MED ORDER — APIXABAN 2.5 MG PO TABS
2.5000 mg | ORAL_TABLET | Freq: Two times a day (BID) | ORAL | 3 refills | Status: DC
Start: 2017-01-05 — End: 2017-04-23

## 2017-01-05 MED ORDER — METOPROLOL TARTRATE 12.5 MG HALF TABLET
12.5000 mg | ORAL_TABLET | Freq: Two times a day (BID) | ORAL | Status: DC
  Administered 2017-01-05: 12.5 mg via ORAL
  Filled 2017-01-05: qty 1

## 2017-01-05 MED ORDER — APIXABAN 2.5 MG PO TABS
2.5000 mg | ORAL_TABLET | Freq: Two times a day (BID) | ORAL | Status: DC
  Administered 2017-01-05: 2.5 mg via ORAL
  Filled 2017-01-05: qty 1

## 2017-01-05 MED ORDER — IRBESARTAN 75 MG PO TABS: 75.0000 mg | ORAL_TABLET | Freq: Every day | ORAL | 1 refills | Status: DC

## 2017-01-05 NOTE — Progress Notes (Signed)
Initial Nutrition Assessment  INTERVENTION:   Continue Ensure Enlive po BID, each supplement provides 350 kcal and 20 grams of protein  Reviewed protein sources, discussed importance of adequate kcal/protein after d/c to prevent further weight loss.  Instructions added to after visit summary.   NUTRITION DIAGNOSIS:   Inadequate oral intake related to mouth pain, poor appetite as evidenced by per patient/family report, 7 percent weight loss x 3-4 months.   GOAL:   Patient will meet greater than or equal to 90% of their needs  MONITOR:   PO intake, I & O's  REASON FOR ASSESSMENT:   Malnutrition Screening Tool    ASSESSMENT:   83 y.o. female with atrial fib, CAD s/p CABG, severe aortic valve stenosis admitted following TAVR 01/03/17.  Spoke with pt and her daughter. Daughter lives with pt. They both cook. Pt reports decreased appetite, problems with her teeth and weight loss starting 3-4 mo ago. She was eating same meals just smaller. She just had her teeth removed. She is now using a chopper at home to make food very fine to pureed consistency because she tolerates this best.  She also drinks 1-2 ensures each day.  She reports losing 10 lb during this time. From 138 lb to 128 lb. 7% x 3-4 months.  Nutrition-Focused physical exam completed. Findings are no fat depletion, mild/moderate muscle depletion of temples and patellar regions, and no edema.  Pt does not meet criteria at this time for malnutrition but is at high risk if poor intake and weight loss continue.   Labs and medications reviewed   Diet Order:  Diet full liquid Room service appropriate? Yes; Fluid consistency: Thin Diet - low sodium heart healthy  Skin:   (groin incisions and ecchymosis)  Last BM:  8/14  Height:   Ht Readings from Last 1 Encounters:  01/04/17 5\' 5"  (1.651 m)    Weight:   Wt Readings from Last 1 Encounters:  01/05/17 119 lb 4.8 oz (54.1 kg)    Ideal Body Weight:  56.8 kg  BMI:   Body mass index is 19.85 kg/m.  Estimated Nutritional Needs:   Kcal:  1500-1700  Protein:  70-80 grams  Fluid:  >/= 1.5 L/day  EDUCATION NEEDS:   Education needs addressed  Maylon Peppers RD, [place], [place] Pager [phone] After Hours Pager

## 2017-01-05 NOTE — Discharge Instructions (Signed)
[facility] Stay Proper nutrition can help your body recover from illness and injury.   Foods and beverages high in protein, vitamins, and minerals help rebuild muscle loss, promote healing, & reduce fall risk.   In addition to eating healthy foods, a nutrition shake is an easy, delicious way to get the nutrition you need during and after your hospital stay  It is recommended that you continue to drink 2 bottles per day of:       Ensure Plus or Boost Plus or Equate Plus for at least 1 month (30 days) after your hospital stay   Tips for adding a nutrition shake into your routine: As allowed, drink one with vitamins or medications instead of water or juice Enjoy one as a tasty mid-morning or afternoon snack Drink cold or make a milkshake out of it Drink one instead of milk with cereal or snacks Use as a coffee creamer   Available at the following grocery stores and pharmacies:           * [facility] [facility] [phone]            For COUPONS visit: www.[email or web address].com/join or [email or web address]   Suggested Substitutions Ensure Plus = Boost Plus = Carnation Breakfast Essentials = Boost Compact Ensure Active Clear = Boost Breeze Glucerna Shake = Boost Glucose Control = Carnation Breakfast Essentials SUGAR FREE

## 2017-01-05 NOTE — Progress Notes (Signed)
Progress Note  Patient Name: [name] Date of Encounter: 01/05/2017  Primary Cardiologist: Angelena Form  Subjective   No chest pain or dyspnea.   Inpatient Medications    Scheduled Meds: . acetaminophen  1,000 mg Oral Q6H   Or  . acetaminophen (TYLENOL) oral liquid 160 mg/5 mL  1,000 mg Per Tube Q6H  . feeding supplement (ENSURE ENLIVE)  237 mL Oral BID BM  . fluticasone  1 spray Each Nare Daily  . guaiFENesin  600 mg Oral BID  . irbesartan  75 mg Oral Daily  . isosorbide dinitrate  30 mg Oral BID  . loratadine  10 mg Oral Daily  . pantoprazole  40 mg Oral BID  . rosuvastatin  40 mg Oral QPM   Continuous Infusions: . cefUROXime (ZINACEF)  IV Stopped (01/04/17 2244)   PRN Meds: midazolam, morphine injection, ondansetron (ZOFRAN) IV, oxyCODONE, sodium chloride flush, traMADol   Vital Signs    Vitals:   01/04/17 1900 01/04/17 1939 01/04/17 2033 01/05/17 0427  BP:   (!) 154/66 (!) 143/74  Pulse:   78 82  Resp: (!) 21  (!) 28 (!) 23  Temp:  98 F (36.7 C) 98.7 F (37.1 C) 98.4 F (36.9 C)  TempSrc:  Oral Oral Oral  SpO2:   98% 97%  Weight:   133 lb 4.8 oz (60.5 kg) 119 lb 4.8 oz (54.1 kg)  Height:   5\' 5"  (1.651 m)     Intake/Output Summary (Last 24 hours) at 01/05/17 0651 Last data filed at 01/04/17 1818  Gross per 24 hour  Intake              840 ml  Output              900 ml  Net              -60 ml   Filed Weights   01/04/17 0300 01/04/17 2033 01/05/17 0427  Weight: 121 lb 4.1 oz (55 kg) 133 lb 4.8 oz (60.5 kg) 119 lb 4.8 oz (54.1 kg)    Telemetry    Sinus  - Personally Reviewed  ECG       Physical Exam   General: Well developed, well nourished, NAD  Neuro: No focal deficits  Musculoskeletal: Muscle strength 5/5 all ext  Psychiatric: Mood and affect normal  Neck: No JVD Lungs:Clear bilaterally, no wheezes, rhonci, crackles Cardiovascular: Regular rate and rhythm. No murmurs, gallops or rubs. Abdomen:Soft. Bowel sounds present.  Non-tender.  Extremities: No lower extremity edema. Pulses are 2 + in the bilateral DP/PT. Bilateral groins without hematoma    Labs    Chemistry  Recent Labs Lab 01/04/17 0500 01/05/17 0049 01/05/17 0344  NA 139 141 141  K 4.0 3.5 3.3*  CL 106 107 109  CO2 23 25 27   GLUCOSE 129* 133* 111*  BUN 8 11 11   CREATININE 0.50 0.54 0.46  CALCIUM 8.9 8.9 8.7*  GFRNONAA >60 >60 >60  GFRAA >60 >60 >60  ANIONGAP 10 9 5      Hematology  Recent Labs Lab 01/04/17 0500 01/05/17 0049 01/05/17 0344  WBC 8.4 10.2 7.3  RBC 3.37* 3.54* 3.29*  HGB 10.0* 10.5* 9.7*  HCT 30.9* 32.9* 30.4*  MCV 91.7 92.9 92.4  MCH 29.7 29.7 29.5  MCHC 32.4 31.9 31.9  RDW 14.2 14.5 14.5  PLT 184 187 187      Radiology    Dg Chest Port 1 View  Result Date: 01/03/2017 CLINICAL DATA:  Status post transcatheter aortic valve replacement. History of asthma, coronary artery disease and previous MI EXAM: PORTABLE CHEST 1 VIEW COMPARISON:  PA and lateral chest x-ray of December 28, 2016 FINDINGS: The lungs are well-expanded. The lung markings are coarse in the left infrahilar region and are slightly more conspicuous than on the previous study. There is no significant pleural effusion and there is no pneumothorax or pneumomediastinum. The heart and pulmonary vascularity are normal. The aortic valve cage appears to be in reasonable position. There are stable changes of previous CABG. There is calcification in the wall of the thoracic aorta. The right internal jugular venous catheter tip projects over the distal third of the SVC. IMPRESSION: No immediate postprocedure complication. Mild chronic bronchitic -reactive airway changes. Probable subsegmental atelectasis in the left infrahilar region. No CHF. A follow-up PA and lateral chest x-ray would be useful when the patient can tolerate the procedure. Thoracic aortic atherosclerosis. Electronically Signed   By: David  Martinique M.D.   On: 01/03/2017 11:36    Cardiac Studies    Echo 01/04/17: - Left ventricle: The cavity size was normal. Wall thickness was   increased in a pattern of mild LVH. Systolic function was normal.   The estimated ejection fraction was in the range of 60% to 65%.   Wall motion was normal; there were no regional wall motion   abnormalities. Features are consistent with a pseudonormal left   ventricular filling pattern, with concomitant abnormal relaxation   and increased filling pressure (grade 2 diastolic dysfunction). - Aortic valve: Bioprosthetic aortic valve s/p TAVR. No significant   bioprosthetic valvular stenosis. There was no significant   regurgitation. Mean gradient (S): 11 mm Hg. Valve area (VTI):   1.53 cm^2. - Mitral valve: Mildly calcified annulus. Mildly calcified leaflets   . There was trivial regurgitation. - Right ventricle: The cavity size was normal. Systolic function   was normal. - Tricuspid valve: Peak RV-RA gradient (S): 34 mm Hg. - Pulmonary arteries: PA peak pressure: 37 mm Hg (S). - Inferior vena cava: The vessel was normal in size. The   respirophasic diameter changes were in the normal range (= 50%),   consistent with normal central venous pressure.  Impressions:  - Normal LV size with mild LV hypertrophy. EF 60-65%. Moderate   diastolic dysfunction. Normal RV size and systolic function.   Bioprosthetic aortic valve s/p TAVR with no significant   regurgitation or stenosis. Mild pulmonary hypertension.   Patient Profile     83 y.o. female with atrial fib, CAD s/p CABG, severe aortic valve stenosis admitted following TAVR 01/03/17.   Assessment & Plan    1. Severe aortic valve stenosis: She is now two days post TAVR. She is doing well. BP stable. Will resume home Cardizem and metoprolol. Echo with normal LV function and normally functioning AVR.  -Restart Eliquis today.   2. Atrial fibrillation, paroxysmal: Sinus today. Resume home dose of Cardizem and metoprolol. Restart Eliquis today.   3. CAD  s/p CABG: stable.   4. Hypokalemia: replace potassium this am  Discharge home today. She will need follow up in one week with office APP and then one month with me at [facility] office with echo day of visit with me.    Signed, Lauree Chandler, MD  01/05/2017, 6:51 AM

## 2017-01-05 NOTE — Progress Notes (Signed)
CARDIAC REHAB PHASE I   PRE:  Rate/Rhythm: 82 SR  BP:  Sitting: 138/67        SaO2: 97 RA  MODE:  Ambulation: 40 ft   POST:  Rate/Rhythm: 90 SR  BP:  Sitting: 148/76         SaO2: 98 RA  Pt assisted to bedside commode prior to ambulation. Pt ambulated 40 ft on RA, rolling walker, gait belt, assist x1, slow, mildly unsteady gait at baseline, tolerated fair. Pt c/o mild DOE, fatigue with distance, bent posture due to kyphoscoliosis. Pt fairly sedentary at baseline, declines any PT services. Completed cardiac surgery discharge education with pt and daughter at bedside. Reviewed restrictions, groin site care, activity progression, heart healthy diet handout, daily weights, and phase 2 cardiac rehab. Pt and daughter verbalized understanding. Pt declines phase 2 cardiac rehab referral. Pt to recliner after walk, call bell within reach.   [id number] Lenna Sciara, RN, BSN 01/05/2017 10:58 AM

## 2017-01-05 NOTE — Progress Notes (Signed)
Order received to discharge pt. Telemetry monitor removed and CCMD notified. PIV access removed. Discharge instructions, follow-up, medications and instructions for their use discussed with patient and family.

## 2017-01-06 MED FILL — Phenylephrine HCl Inj 10 MG/ML: INTRAMUSCULAR | Qty: 2 | Status: AC

## 2017-01-06 MED FILL — Sodium Chloride IV Soln 0.9%: INTRAVENOUS | Qty: 250 | Status: AC

## 2017-01-11 ENCOUNTER — Encounter: Payer: Self-pay | Admitting: Cardiology

## 2017-01-12 ENCOUNTER — Ambulatory Visit (INDEPENDENT_AMBULATORY_CARE_PROVIDER_SITE_OTHER): Payer: Medicare Other | Admitting: Cardiology

## 2017-01-12 ENCOUNTER — Encounter: Payer: Self-pay | Admitting: Cardiology

## 2017-01-12 VITALS — BP 98/52 | HR 66 | Ht 65.0 in | Wt 118.1 lb

## 2017-01-12 DIAGNOSIS — I25119 Atherosclerotic heart disease of native coronary artery with unspecified angina pectoris: Secondary | ICD-10-CM

## 2017-01-12 DIAGNOSIS — Z952 Presence of prosthetic heart valve: Secondary | ICD-10-CM

## 2017-01-12 NOTE — Progress Notes (Signed)
01/12/2017 [name]   1929/04/05  [id number]  Primary Physician Denita Lung, MD Primary Cardiologist: Dr. Angelena Form    Reason for Visit/CC: [facility] f/u s/p TAVR for Severe AS  HPI:  [name] is a 83 y.o. female who presents to clinic today for post hospital f/u. She was recently admitted for TAVR for severe AS.   She also has a h/o CAD /p CABG in 2006, atrial fib s/p MAZE procedure, CVA, carotid artery disease s/p right CEA, fibromyalgia, GERD and mild to moderate MR. Recent LHC prior to valve replacement showed 3/4 patent grafts.   Her recent TAVR was performed 01/03/17 by Dr. Angelena Form and Dr. Cyndia Bent, using an [place] (size28mm). She tolerated procedure well w/o any significant complications, Follow up echo showed normal LVEF and normally functioning AVR. She was continued on Eliquis only. No ASA given h/o ASA allergy.   She is here today with her daughter for f/u. She denies CP and dyspnea. Her femoral access sites are stable. Her only complaints are noncardiac. She notes changes in urination this morning. She normally goes frequently each morning, however this morning she has not urinated much , very little urine when she goes, despite drinking plenty of fluids. She notes mild occasional dysuria (not new) but no pelvic, flank or low back pain. No changes in odor or appearance of urine. No fever or chills.  Her BP is WNL but lower than her normal. She is currently asymptomatic but has had some mild occasional bouts of brief dizziness and "glassy vision". BP is 98/52 today. Her daughter notes that her SBPs are usually in the 110s-120s.   Also of note, EKG shows new LBBB but pt w/o CP and dyspnea. She is in SR with 1st degree AVB. HR is 66 bpm.     Current Meds  Medication Sig  . acetaminophen (TYLENOL) 650 MG CR tablet Take 1,300 mg by mouth 2 (two) times daily as needed for pain.   Marland Kitchen apixaban (ELIQUIS) 2.5 MG TABS tablet Take 1 tablet (2.5 mg total) by  mouth 2 (two) times daily.  . calcium-vitamin D (OSCAL WITH D) 500-200 MG-UNIT per tablet Take 1 tablet by mouth daily with breakfast.  . cholecalciferol (VITAMIN D) 1000 UNITS tablet Take 1,000 Units by mouth daily.  Marland Kitchen diltiazem (CARDIZEM CD) 180 MG 24 hr capsule TAKE 1 CAPSULE BY MOUTH DAILY  . FLOVENT HFA 110 MCG/ACT inhaler inhale 1 puff by mouth twice a day  . fluticasone (FLONASE) 50 MCG/ACT nasal spray instill 2 sprays into each nostril once daily  . guaiFENesin (MUCINEX) 600 MG 12 hr tablet Take 1 tablet (600 mg total) by mouth 2 (two) times daily.  Marland Kitchen guaiFENesin (ROBITUSSIN) 100 MG/5ML liquid Take 100 mg by mouth 3 (three) times daily as needed for cough. Reported on 12/08/2015  . HYDROcodone-acetaminophen (HYCET) 7.5-325 mg/15 ml solution Take 10-15 ML's by mouth every 6 hours as needed for moderate to severe pain.  Marland Kitchen irbesartan (AVAPRO) 75 MG tablet Take 1 tablet (75 mg total) by mouth daily.  . isosorbide dinitrate (ISORDIL) 30 MG tablet TAKE 1 TABLET BY MOUTH 2 TIMES DAILY  . loratadine (CLARITIN) 10 MG tablet Take 10 mg by mouth daily.    . metoprolol tartrate (LOPRESSOR) 25 MG tablet take 1/2 tablet by mouth twice a day  . nitroGLYCERIN (NITROSTAT) 0.4 MG SL tablet place 1 tablet UNDER THE TONGUE EVERY 5 MINUTES if needed for chest pain MAX OF 3 TABS (Patient  taking differently: Place 0.4 mg under the tongue every 5 (five) minutes as needed for chest pain. place 1 tablet UNDER THE TONGUE EVERY 5 MINUTES if needed for chest pain MAX OF 3 TABS)  . pantoprazole (PROTONIX) 40 MG tablet Take 1 tablet (40 mg total) by mouth 2 (two) times daily.  . rosuvastatin (CRESTOR) 40 MG tablet take 1 tablet by mouth once daily (Patient taking differently: take 1 tablet by mouth once daily in the evening)  . VENTOLIN HFA 108 (90 Base) MCG/ACT inhaler INHALE 2 PUFFS BY MOUTH EVERY 6 HOURS AS NEEDED FOR WHEEZING OR SHORTNESS OF BREATH   Allergies  Allergen Reactions  . Bactrim  [Sulfamethoxazole-Trimethoprim] Other (See Comments)    "makes her feel funny" or "unsteady"   . Amitriptyline Hcl Rash  . Aspirin Swelling and Rash    SWELLING REACTION UNSPECIFIED   . Zetia [Ezetimibe] Rash   Past Medical History:  Diagnosis Date  . Anxiety   . Arthritis    OSTEO  . Aspirin allergy    on Plavix  . Asthma   . Bronchitis   . CAD (coronary artery disease)    a. s/p CABG 2006 with Cox-Maze procedure.  . Carotid artery disease ([place])    a. s/p R CEA.  . Chronic stable angina ([place])   . Diastolic dysfunction   . Eczema   . Fibromyalgia   . GERD (gastroesophageal reflux disease)   . Heart murmur   . Hiatal hernia   . Hyperlipidemia   . Hypertension   . Kyphoscoliosis   . Mitral regurgitation   . Myocardial infarction [facility]) 2003   Stent to CFX  . PAF (paroxysmal atrial fibrillation) ([place])    a. pt has h/o hematuria on Eliquis and has since refused anticoagulation  . Pneumonia 2015 ?  Marland Kitchen Pulmonary regurgitation   . Severe aortic stenosis   . Skin cancer of arm   . Stroke ([place])   . Tricuspid regurgitation    Family History  Problem Relation Age of Onset  . Heart failure Mother   . Other Father    Past Surgical History:  Procedure Laterality Date  . ABDOMINAL HYSTERECTOMY  1976  . CAROTID ENDARTERECTOMY  2010  . CORONARY ANGIOPLASTY WITH STENT PLACEMENT    . CORONARY ARTERY BYPASS GRAFT  01/2005   LIMA-D1; SVG-LAD; SVG-OM; SVG-PDA  . EYE SURGERY    . MULTIPLE EXTRACTIONS WITH ALVEOLOPLASTY  12/28/2016   Extraction of tooth #'s 2- 5,7-10, 12,13,17-20,and 22-29 with alveoloplasty and maxillary right and left lateral exostoses reductions  . MULTIPLE EXTRACTIONS WITH ALVEOLOPLASTY N/A 12/28/2016   Procedure: Extraction of tooth #'s 2- 5,7-10, 12,13,17-20,and 22-29 with alveoloplasty and maxillary right and left lateral exostoses reductions;  Surgeon: Lenn Cal, DDS;  Location: [place];  Service: Oral Surgery;  Laterality: N/A;  . RIGHT/LEFT HEART CATH  AND CORONARY/GRAFT ANGIOGRAPHY N/A 10/27/2016   Procedure: Right/Left Heart Cath and Coronary/Graft Angiography;  Surgeon: Burnell Blanks, MD;  Location: [place] [facility];  Service: Cardiovascular;  Laterality: N/A;  . TEE WITHOUT CARDIOVERSION N/A 01/03/2017   Procedure: TRANSESOPHAGEAL ECHOCARDIOGRAM (TEE);  Surgeon: Burnell Blanks, MD;  Location: [place];  Service: Open Heart Surgery;  Laterality: N/A;  . TONSILLECTOMY    . TRANSCATHETER AORTIC VALVE REPLACEMENT, TRANSFEMORAL N/A 01/03/2017   Procedure: TRANSCATHETER AORTIC VALVE REPLACEMENT, TRANSFEMORAL;  Surgeon: Burnell Blanks, MD;  Location: [place];  Service: Open Heart Surgery;  Laterality: N/A;  . TUMOR REMOVAL  Social History   Social History  . Marital status: Widowed    Spouse name: N/A  . Number of children: 3  . Years of education: N/A   Occupational History  .  Retired   Social History Main Topics  . Smoking status: Never Smoker  . Smokeless tobacco: Former Systems developer    Types: Snuff    Quit date: 05/23/2001  . Alcohol use No  . Drug use: No  . Sexual activity: Not Currently   Other Topics Concern  . Not on file   Social History Narrative  . No narrative on file     Review of Systems: General: negative for chills, fever, night sweats or weight changes.  Cardiovascular: negative for chest pain, dyspnea on exertion, edema, orthopnea, palpitations, paroxysmal nocturnal dyspnea or shortness of breath Dermatological: negative for rash Respiratory: negative for cough or wheezing Urologic: negative for hematuria Abdominal: negative for nausea, vomiting, diarrhea, bright red blood per rectum, melena, or hematemesis Neurologic: negative for visual changes, syncope, or dizziness All other systems reviewed and are otherwise negative except as noted above.   Physical Exam:  Blood pressure (!) 98/52, pulse 66, height 5\' 5"  (1.651 m), weight 118 lb 1.9 oz (53.6 kg).  General appearance: alert,  cooperative and no distress Neck: no carotid bruit and no JVD Lungs: clear to auscultation bilaterally Heart: regular rate and rhythm, S1, S2 normal, no murmur, click, rub or gallop Extremities: extremities normal, atraumatic, no cyanosis or edema Pulses: 2+ and symmetric Skin: Skin color, texture, turgor normal. No rashes or lesions Neurologic: Grossly normal  EKG NSR with 1st degree HB, LBBB 66 bpm-- personally reviewed   ASSESSMENT AND PLAN:   1. H/o Severe AS s/p TAVR: s/p TAVR 01/03/17 by Dr. Angelena Form. Stable w/o issues. Post op Echo with normally functioning valve and normal LVEF. She is on Eliquis only. No ASA given allergy. She is scheduled for 1 month post op echo and f/u with Dr. Angelena Form on 02/06/17.  2. CAD: h/o CABG in 2006 with 3/4 patent grafts on recent LHC. Stable w/o symptoms.  3. Atrial Fibrillation:  H/o Maze procedure at time of CABG. On rate control with Cardizem. we will d/c metoprolol for now (see below). On Eliquis for a/c.   4. H/o CVA: on Eliquis for secondary prevention.   5. Carotid Artery Disease: s/p prior right CEA. Stable w/o symptoms.   6. New LBBB: I have reviewed EKG with Dr. Angelena Form. She is w/o CP or dyspnea. HR is stable. No additional w/u needed.   7. Urinary: pt notes mild change in urination. Less UOP today compared to her baseline but no pelvic/flank/back pain, no changes in odor or appearance. No notable dysuria. No fever or chills. Pt however did have a foley last week during hospitalization. I've discussed with Dr. Angelena Form. Pt advised to f/u with PCP later today or tomorrow if symptoms worsen.   8. Soft BP: BP is normal but lower than her baseline at 98/52. She had has occasional bouts of mild dizziness. No syncope/ near syncope. Dr. Angelena Form has recommended that we stop her metoprolol for now. Monitor at home. He will reassess BP and HR at her f/u visit in 4 weeks.    PLAN  Pt is 1 week s/p TAVR for severe AS. She is doing well w/ major  cardiac issues. Stop metoprolol for soft BP. Keep appt for 1 month f/u echo and office appt with Dr. Angelena Form on 02/06/17.    Liseth Wann Ladoris Gene, MHS [facility] 01/12/2017 11:23 AM

## 2017-01-12 NOTE — Patient Instructions (Addendum)
Medication Instructions:  Your physician has recommended you make the following change in your medication:  1. STOP Metoprolol  Labwork: None ordered  Testing/Procedures: None ordered  Follow-Up: Keep your currently scheduled follow up with Dr. Angelena Form on 9/17 @ 4 pm.  Any Other Special Instructions Will Be Listed Below (If Applicable). Follow up with your primary doctor about your urinary issue   If you need a refill on your cardiac medications before your next appointment, please call your pharmacy.  Thank you for choosing [facility]!!

## 2017-01-13 ENCOUNTER — Encounter: Payer: Self-pay | Admitting: Family Medicine

## 2017-01-13 ENCOUNTER — Ambulatory Visit (INDEPENDENT_AMBULATORY_CARE_PROVIDER_SITE_OTHER): Payer: Medicare Other | Admitting: Family Medicine

## 2017-01-13 VITALS — BP 110/68 | HR 102 | Temp 98.8°F

## 2017-01-13 DIAGNOSIS — R82998 Other abnormal findings in urine: Secondary | ICD-10-CM

## 2017-01-13 DIAGNOSIS — R350 Frequency of micturition: Secondary | ICD-10-CM

## 2017-01-13 DIAGNOSIS — N3001 Acute cystitis with hematuria: Secondary | ICD-10-CM

## 2017-01-13 DIAGNOSIS — R8299 Other abnormal findings in urine: Secondary | ICD-10-CM | POA: Diagnosis not present

## 2017-01-13 DIAGNOSIS — I25119 Atherosclerotic heart disease of native coronary artery with unspecified angina pectoris: Secondary | ICD-10-CM | POA: Diagnosis not present

## 2017-01-13 LAB — POCT URINALYSIS DIP (PROADVANTAGE DEVICE)
BILIRUBIN UA: NEGATIVE mg/dL
Bilirubin, UA: NEGATIVE
GLUCOSE UA: NEGATIVE mg/dL
Nitrite, UA: NEGATIVE
SPECIFIC GRAVITY, URINE: 1.03
Urobilinogen, Ur: NEGATIVE
pH, UA: 6 (ref 5.0–8.0)

## 2017-01-13 MED ORDER — NITROFURANTOIN MONOHYD MACRO 100 MG PO CAPS: 100.0000 mg | ORAL_CAPSULE | Freq: Two times a day (BID) | ORAL | 0 refills | Status: DC

## 2017-01-13 NOTE — Progress Notes (Signed)
Subjective: Chief Complaint  Patient presents with  . urinary issue    urinary issues. started this morning. no pain, feeling she has to go but can't. drank alot of fluid    [name] is a 83 y.o. female who complains of possible urinary tract infection.  She has had symptoms for 2 days.  Symptoms include urinary frequency, urgency, dysuria, hesitency, suprapubic pressure. Patient denies fever, chills, nausea, vomiting, diarrhea, back pain, abdominal pain.  Last UTI was years ago.   Using nothing for current symptoms.  She had a recent surgery that required a urinary catheter for one day.   Patient does not have a history of recurrent UTI. Patient does not have a history of pyelonephritis.  No other aggravating or relieving factors.  No other c/o.  Past Medical History:  Diagnosis Date  . Anxiety   . Arthritis    OSTEO  . Aspirin allergy    on Plavix  . Asthma   . Bronchitis   . CAD (coronary artery disease)    a. s/p CABG 2006 with Cox-Maze procedure.  . Carotid artery disease ([place])    a. s/p R CEA.  . Chronic stable angina ([place])   . Diastolic dysfunction   . Eczema   . Fibromyalgia   . GERD (gastroesophageal reflux disease)   . Heart murmur   . Hiatal hernia   . Hyperlipidemia   . Hypertension   . Kyphoscoliosis   . Mitral regurgitation   . Myocardial infarction [facility]) 2003   Stent to CFX  . PAF (paroxysmal atrial fibrillation) ([place])    a. pt has h/o hematuria on Eliquis and has since refused anticoagulation  . Pneumonia 2015 ?  Marland Kitchen Pulmonary regurgitation   . Severe aortic stenosis   . Skin cancer of arm   . Stroke ([place])   . Tricuspid regurgitation     ROS as in subjective  Reviewed allergies, medications, past medical, surgical, and social history.    Objective: Vitals:   01/13/17 1541  BP: 110/68  Pulse: (!) 102  Temp: 98.8 F (37.1 C)    General appearance: alert, no distress, WD/WN, female Abdomen: +bs, soft, non tender, non distended, no masses,  no hepatomegaly, no splenomegaly, no bruits Back: no CVA tenderness GU: declined      Laboratory:  Urine dipstick: dark, spec grav 1.030, 3+ leuk, moderate blood.       Assessment: Acute cystitis with hematuria - Plan: nitrofurantoin, macrocrystal-monohydrate, (MACROBID) 100 MG capsule, Urine Culture, POCT Urinalysis DIP (Proadvantage Device)  Urinary frequency - Plan: POCT Urinalysis DIP (Proadvantage Device)  Dark urine    Plan: Discussed symptoms, diagnosis, possible complications, and usual course of illness.  Begin Macrobid. Allergy to Bactrim.   Advised increased water intake, can use OTC Tylenol for pain.    Advised to return in 2 weeks for a repeat UA due to hematuria.     Urine culture sent.   Recent 1 day urinary catheter.   Call or return if worse or not improving.  She is aware that if she has urinary retention over the weekend and is not voiding for several hours in spite of drinking plenty of fluids to go to the ED.

## 2017-01-13 NOTE — Patient Instructions (Addendum)
Take the antibiotic as prescribed. Increase water intake and if you are unable to urinate for several hours in spite of drinking plenty of fluids or if you develop fever, chills or start having worsening pain you will need to go to the emergency room.   We will call you with your urine culture results.  Follow up for a repeat urine check if your symptoms do not completely resolve by the time you finish the antibiotic.  Return for a lab visit for repeat urine check in 2-3 weeks due to blood in your urine.

## 2017-01-16 LAB — URINE CULTURE

## 2017-01-18 ENCOUNTER — Telehealth: Payer: Self-pay | Admitting: Family Medicine

## 2017-01-18 ENCOUNTER — Ambulatory Visit: Payer: Medicare Other | Admitting: Family Medicine

## 2017-01-18 NOTE — Telephone Encounter (Signed)
Pt informed to call us back on Tuesday to see how she is doing then

## 2017-01-18 NOTE — Telephone Encounter (Signed)
Pt has a new blood pressure machine. BP was check 3 times today and readings were:  1st time  82/48 2nd time 104/65 3rd time  101/56  Pt has also been very tired. Should she be concerned?

## 2017-01-18 NOTE — Telephone Encounter (Signed)
I wouldn't blade and the fatigue necessarily on that blood pressure. Monitor this through the weekend and let us know.

## 2017-02-01 ENCOUNTER — Other Ambulatory Visit (INDEPENDENT_AMBULATORY_CARE_PROVIDER_SITE_OTHER): Payer: Medicare Other

## 2017-02-01 DIAGNOSIS — N3001 Acute cystitis with hematuria: Secondary | ICD-10-CM

## 2017-02-01 LAB — POCT URINALYSIS DIP (PROADVANTAGE DEVICE)
BILIRUBIN UA: NEGATIVE mg/dL
Bilirubin, UA: NEGATIVE
Blood, UA: NEGATIVE
GLUCOSE UA: NEGATIVE mg/dL
LEUKOCYTES UA: NEGATIVE
Nitrite, UA: NEGATIVE
PH UA: 7 (ref 5.0–8.0)
Protein Ur, POC: NEGATIVE mg/dL
Specific Gravity, Urine: 1.01
Urobilinogen, Ur: NEGATIVE

## 2017-02-06 ENCOUNTER — Encounter: Payer: Self-pay | Admitting: Cardiovascular Disease

## 2017-02-06 ENCOUNTER — Other Ambulatory Visit: Payer: Self-pay

## 2017-02-06 ENCOUNTER — Ambulatory Visit (HOSPITAL_COMMUNITY): Payer: Medicare Other | Attending: Cardiology

## 2017-02-06 ENCOUNTER — Ambulatory Visit (INDEPENDENT_AMBULATORY_CARE_PROVIDER_SITE_OTHER): Payer: Medicare Other | Admitting: Cardiovascular Disease

## 2017-02-06 ENCOUNTER — Encounter (INDEPENDENT_AMBULATORY_CARE_PROVIDER_SITE_OTHER): Payer: Self-pay

## 2017-02-06 VITALS — BP 110/60 | HR 48 | Ht 65.0 in | Wt 116.8 lb

## 2017-02-06 DIAGNOSIS — I4891 Unspecified atrial fibrillation: Secondary | ICD-10-CM | POA: Insufficient documentation

## 2017-02-06 DIAGNOSIS — I340 Nonrheumatic mitral (valve) insufficiency: Secondary | ICD-10-CM

## 2017-02-06 DIAGNOSIS — Z952 Presence of prosthetic heart valve: Secondary | ICD-10-CM | POA: Diagnosis not present

## 2017-02-06 DIAGNOSIS — I779 Disorder of arteries and arterioles, unspecified: Secondary | ICD-10-CM | POA: Diagnosis not present

## 2017-02-06 DIAGNOSIS — I209 Angina pectoris, unspecified: Secondary | ICD-10-CM | POA: Diagnosis not present

## 2017-02-06 DIAGNOSIS — I480 Paroxysmal atrial fibrillation: Secondary | ICD-10-CM

## 2017-02-06 DIAGNOSIS — I081 Rheumatic disorders of both mitral and tricuspid valves: Secondary | ICD-10-CM | POA: Insufficient documentation

## 2017-02-06 DIAGNOSIS — E785 Hyperlipidemia, unspecified: Secondary | ICD-10-CM | POA: Diagnosis not present

## 2017-02-06 DIAGNOSIS — I371 Nonrheumatic pulmonary valve insufficiency: Secondary | ICD-10-CM | POA: Insufficient documentation

## 2017-02-06 DIAGNOSIS — I25119 Atherosclerotic heart disease of native coronary artery with unspecified angina pectoris: Secondary | ICD-10-CM | POA: Diagnosis not present

## 2017-02-06 DIAGNOSIS — R42 Dizziness and giddiness: Secondary | ICD-10-CM

## 2017-02-06 DIAGNOSIS — I2510 Atherosclerotic heart disease of native coronary artery without angina pectoris: Secondary | ICD-10-CM | POA: Diagnosis not present

## 2017-02-06 DIAGNOSIS — I739 Peripheral vascular disease, unspecified: Secondary | ICD-10-CM

## 2017-02-06 DIAGNOSIS — I447 Left bundle-branch block, unspecified: Secondary | ICD-10-CM | POA: Diagnosis not present

## 2017-02-06 DIAGNOSIS — I350 Nonrheumatic aortic (valve) stenosis: Secondary | ICD-10-CM

## 2017-02-06 MED ORDER — DILTIAZEM HCL ER COATED BEADS 120 MG PO CP24: 120.0000 mg | ORAL_CAPSULE | Freq: Every day | ORAL | 6 refills | Status: DC

## 2017-02-06 MED ORDER — NITROGLYCERIN 0.4 MG SL SUBL: SUBLINGUAL_TABLET | SUBLINGUAL | 6 refills | Status: DC

## 2017-02-06 NOTE — Patient Instructions (Addendum)
Medication Instructions:  Your physician has recommended you make the following change in your medication:  Decrease Cardizem CD to 120 mg by mouth daily.    Labwork: none  Testing/Procedures: Your physician has recommended that you wear a holter monitor. Holter monitors are medical devices that record the heart's electrical activity. Doctors most often use these monitors to diagnose arrhythmias. Arrhythmias are problems with the speed or rhythm of the heartbeat. The monitor is a small, portable device. You can wear one while you do your normal daily activities. This is usually used to diagnose what is causing palpitations/syncope (passing out).    Follow-Up: Your physician recommends that you schedule a follow-up appointment in: about 6 weeks.  --03/24/17 at 4:00    Any Other Special Instructions Will Be Listed Below (If Applicable).     If you need a refill on your cardiac medications before your next appointment, please call your pharmacy.

## 2017-02-06 NOTE — Progress Notes (Signed)
Chief Complaint  Patient presents with  . Follow-up    one month post tavr    History of Present Illness: 82 yo female with history of CAD s/p CABG, atrial fib s/p MAZE procedure, CVA, carotid artery disease s/p right CEA, fibromyalgia, GERD, moderate MR, severe AS s/p TAVR who is here today for one month TAVR follow up. She underwent CABG in 2006 with MAZE procedure. She is known to have moderate carotid disease by last dopplers in June 2018. She was found to have severe AS by echo in July 2017 along with moderate MR. We discussed her aortic valve disease at that time but she did not wish to pursue further evaluation. She is known to have atrial fibrillation but refused anticoagulation due to hematuria when on Eliquis in 2015. In the spring of 2018 she began to have dyspnea and chest pain. Echo 10/14/16 with normal LV systolic function, mild mitral regurgitation and severe aortic stenosis (mean gradient 54 mm Hg peak gradient 101 mmHg). I was able to talk her into considering TAVR. Cardiac cath 10/27/16 with 3/4 patent bypass grafts. The LIMA to the Diagonal was atretic but vein grafts were open to the LAD, Circumflex and distal RCA. She was found to have a LAA thrombus on pre-operative testing and was started on Eliquis. She underwent TAVR on 01/03/17 with placement of a 23 mm Edwards Sapien 3 valve from the right femoral artery.   She is here today for one month TAVR follow up. The patient denies any chest pain, dyspnea, palpitations, lower extremity edema, orthopnea, PND. She has had several episodes of dizziness over the past few weeks. Heart rates in the 50-60's at home. BP low 100s at home. No near syncope or syncope. Breathing is much better post valve replacement.   Primary Care Physician: Denita Lung, MD   Past Medical History:  Diagnosis Date  . Anxiety   . Arthritis    OSTEO  . Aspirin allergy    on Plavix  . Asthma   . Bronchitis   . CAD (coronary artery disease)    a. s/p  CABG 2006 with Cox-Maze procedure.  . Carotid artery disease ([place])    a. s/p R CEA.  . Chronic stable angina ([place])   . Diastolic dysfunction   . Eczema   . Fibromyalgia   . GERD (gastroesophageal reflux disease)   . Heart murmur   . Hiatal hernia   . Hyperlipidemia   . Hypertension   . Kyphoscoliosis   . Mitral regurgitation   . Myocardial infarction [facility]) 2003   Stent to CFX  . PAF (paroxysmal atrial fibrillation) ([place])    a. pt has h/o hematuria on Eliquis and has since refused anticoagulation  . Pneumonia 2015 ?  Marland Kitchen Pulmonary regurgitation   . Severe aortic stenosis   . Skin cancer of arm   . Stroke ([place])   . Tricuspid regurgitation     Past Surgical History:  Procedure Laterality Date  . ABDOMINAL HYSTERECTOMY  1976  . CAROTID ENDARTERECTOMY  2010  . CORONARY ANGIOPLASTY WITH STENT PLACEMENT    . CORONARY ARTERY BYPASS GRAFT  01/2005   LIMA-D1; SVG-LAD; SVG-OM; SVG-PDA  . EYE SURGERY    . MULTIPLE EXTRACTIONS WITH ALVEOLOPLASTY  12/28/2016   Extraction of tooth #'s 2- 5,7-10, 12,13,17-20,and 22-29 with alveoloplasty and maxillary right and left lateral exostoses reductions  . MULTIPLE EXTRACTIONS WITH ALVEOLOPLASTY N/A 12/28/2016   Procedure: Extraction of tooth #'s 2- 5,7-10, 53,66,44-03,KVQ 22-29 with  alveoloplasty and maxillary right and left lateral exostoses reductions;  Surgeon: Lenn Cal, DDS;  Location: [place];  Service: Oral Surgery;  Laterality: N/A;  . RIGHT/LEFT HEART CATH AND CORONARY/GRAFT ANGIOGRAPHY N/A 10/27/2016   Procedure: Right/Left Heart Cath and Coronary/Graft Angiography;  Surgeon: Burnell Blanks, MD;  Location: [place] [facility];  Service: Cardiovascular;  Laterality: N/A;  . TEE WITHOUT CARDIOVERSION N/A 01/03/2017   Procedure: TRANSESOPHAGEAL ECHOCARDIOGRAM (TEE);  Surgeon: Burnell Blanks, MD;  Location: [place];  Service: Open Heart Surgery;  Laterality: N/A;  . TONSILLECTOMY    . TRANSCATHETER AORTIC VALVE REPLACEMENT,  TRANSFEMORAL N/A 01/03/2017   Procedure: TRANSCATHETER AORTIC VALVE REPLACEMENT, TRANSFEMORAL;  Surgeon: Burnell Blanks, MD;  Location: [place];  Service: Open Heart Surgery;  Laterality: N/A;  . TUMOR REMOVAL      Current Outpatient Prescriptions  Medication Sig Dispense Refill  . acetaminophen (TYLENOL) 650 MG CR tablet Take 1,300 mg by mouth 2 (two) times daily as needed for pain.     Marland Kitchen apixaban (ELIQUIS) 2.5 MG TABS tablet Take 1 tablet (2.5 mg total) by mouth 2 (two) times daily. 60 tablet 3  . calcium-vitamin D (OSCAL WITH D) 500-200 MG-UNIT per tablet Take 1 tablet by mouth daily with breakfast.    . cholecalciferol (VITAMIN D) 1000 UNITS tablet Take 1,000 Units by mouth daily.    Marland Kitchen FLOVENT HFA 110 MCG/ACT inhaler inhale 1 puff by mouth twice a day 12 g 1  . fluticasone (FLONASE) 50 MCG/ACT nasal spray instill 2 sprays into each nostril once daily 16 g 3  . guaiFENesin (MUCINEX) 600 MG 12 hr tablet Take 1 tablet (600 mg total) by mouth 2 (two) times daily. 30 tablet 0  . guaiFENesin (ROBITUSSIN) 100 MG/5ML liquid Take 100 mg by mouth 3 (three) times daily as needed for cough. Reported on 12/08/2015    . HYDROcodone-acetaminophen (HYCET) 7.5-325 mg/15 ml solution Take 10-15 ML's by mouth every 6 hours as needed for moderate to severe pain. 180 mL 0  . irbesartan (AVAPRO) 75 MG tablet Take 1 tablet (75 mg total) by mouth daily. 90 tablet 1  . isosorbide dinitrate (ISORDIL) 30 MG tablet TAKE 1 TABLET BY MOUTH 2 TIMES DAILY 60 tablet 7  . loratadine (CLARITIN) 10 MG tablet Take 10 mg by mouth daily.      . nitroGLYCERIN (NITROSTAT) 0.4 MG SL tablet place 1 tablet UNDER THE TONGUE EVERY 5 MINUTES if needed for chest pain MAX OF 3 TABS 25 tablet 6  . pantoprazole (PROTONIX) 40 MG tablet Take 1 tablet (40 mg total) by mouth 2 (two) times daily. 180 tablet 3  . rosuvastatin (CRESTOR) 40 MG tablet take 1 tablet by mouth once daily (Patient taking differently: take 1 tablet by mouth once daily  in the evening) 90 tablet 1  . VENTOLIN HFA 108 (90 Base) MCG/ACT inhaler INHALE 2 PUFFS BY MOUTH EVERY 6 HOURS AS NEEDED FOR WHEEZING OR SHORTNESS OF BREATH 18 Inhaler 5  . diltiazem (CARDIZEM CD) 120 MG 24 hr capsule Take 1 capsule (120 mg total) by mouth daily. 30 capsule 6   No current facility-administered medications for this visit.     Allergies  Allergen Reactions  . Bactrim [Sulfamethoxazole-Trimethoprim] Other (See Comments)    "makes her feel funny" or "unsteady"   . Amitriptyline Hcl Rash  . Aspirin Swelling and Rash    SWELLING REACTION UNSPECIFIED   . Zetia [Ezetimibe] Rash    Social History   Social  History  . Marital status: Widowed    Spouse name: N/A  . Number of children: 3  . Years of education: N/A   Occupational History  .  Retired   Social History Main Topics  . Smoking status: Never Smoker  . Smokeless tobacco: Former Systems developer    Types: Snuff    Quit date: 05/23/2001  . Alcohol use No  . Drug use: No  . Sexual activity: Not Currently   Other Topics Concern  . Not on file   Social History Narrative  . No narrative on file    Family History  Problem Relation Age of Onset  . Heart failure Mother   . Other Father     Review of Systems:  As stated in the HPI and otherwise negative.   BP 110/60   Pulse (!) 48   Ht 5\' 5"  (1.651 m)   Wt 116 lb 12.8 oz (53 kg)   SpO2 98%   BMI 19.44 kg/m   Physical Examination:  General: Well developed, well nourished, NAD  HEENT: OP clear, mucus membranes moist  SKIN: warm, dry. No rashes. Neuro: No focal deficits  Musculoskeletal: Muscle strength 5/5 all ext  Psychiatric: Mood and affect normal  Neck: No JVD, no carotid bruits, no thyromegaly, no lymphadenopathy.  Lungs:Clear bilaterally, no wheezes, rhonci, crackles Cardiovascular: Bradycardic, regular. Soft systolic murmur. No gallops or rubs. Abdomen:Soft. Bowel sounds present. Non-tender.  Extremities: No lower extremity edema. Pulses are 2 + in  the bilateral DP/PT.  Echo 02/03/17: Left ventricle: The cavity size was normal. Systolic function was   normal. The estimated ejection fraction was in the range of 60%   to 65%. Wall motion was normal; there were no regional wall   motion abnormalities. The study was not technically sufficient to   allow evaluation of LV diastolic dysfunction due to atrial   fibrillation. - Aortic valve: A TAVR bioprosthetic valve is present (23 mm   Edwards-SAPIEN). There was no regurgitation. Mean gradient (S): 8   mm Hg. Peak gradient (S): 19 mm Hg. - Aortic root: The aortic root was normal in size. - Mitral valve: Calcified annulus. Moderately thickened, moderately   calcified leaflets . There was mild regurgitation. - Left atrium: The atrium was normal in size. - Right ventricle: Systolic function was normal. - Tricuspid valve: There was moderate regurgitation. - Pulmonic valve: There was mild regurgitation. - Pulmonary arteries: Systolic pressure was mildly increased. PA   peak pressure: 38 mm Hg (S). - Inferior vena cava: The vessel was normal in size. - Pericardium, extracardiac: There was no pericardial effusion.  Impressions:  - Ni significant change since the prior study on 01/04/2017,   transaortic gradients across the bioprosthetic valve remain   unchanged and are normal for this type of prosthesis. There is no   central aortic regurgitation or paravalvular leak.  EKG:  EKG is not ordered today. The ekg ordered today demonstrates  Recent Labs: 12/26/2016: ALT 68 01/04/2017: Magnesium 2.3 01/05/2017: BUN 11; Creatinine, Ser 0.46; Hemoglobin 9.7; Platelets 187; Potassium 3.3; Sodium 141   Lipid Panel    Component Value Date/Time   CHOL 144 08/21/2015 0001   TRIG 87 08/21/2015 0001   HDL 73 08/21/2015 0001   CHOLHDL 2.0 08/21/2015 0001   VLDL 17 08/21/2015 0001   LDLCALC 54 08/21/2015 0001     Wt Readings from Last 3 Encounters:  02/06/17 116 lb 12.8 oz (53 kg)  01/12/17 118  lb 1.9 oz (53.6 kg)  01/05/17 119 lb 4.8 oz (54.1 kg)     Other studies Reviewed: Additional studies/ records that were reviewed today include: . Review of the above records demonstrates:   Assessment and Plan:   1. Severe aortic valve stenosis: She is one month post TAVR with a 23 mm Edwards Sapien 3 valve placed from the right femoral artery. Her post procedure course was not complicated. She is on Eliquis. She is NYHA class 2. Echo today shows normal LV function with normally functioning bioprosthetic aortic valve. Expected gradient of 31mmHg across bioprosthetic valve. She does have a new LBBB post TAVR. The significance of this is not clear.She is bradycardic. Will arrange 48 hour monitor to assess heart rate at home.  Continue Eliquis. She will need antibiotic prophylaxis prior to dental visits.   2. Carotid artery disease: Moderate bilateral disease by dopplers 2018.    3. Mitral regurgitation: Mild echo 2018.   4. CAD with stable angina: She is s/p CABG. She has stable angina, perhaps improved post TAVR. Will continue Continue Isordil, statin.     5. Atrial fibrillation,paroxysmal: She appears to be in sinus today. Continue Cardizem but given bradycardia, will lower Cardizem dose to 120 mg daily. Continue Eliquis.   6. LBBB: new post TAVR. She has dizziness at times but this is unchanged from the symptoms she was having prior to TAVR when she had a normal QRS duration.     7. Dizziness/bradycardia: Will lower Cardizem CD to 120 mg daily. Will arrange 48 hour cardiac monitor.   Current medicines are reviewed at length with the patient today.  The patient does not have concerns regarding medicines.  The following changes have been made:  no change  Labs/ tests ordered today include:   Orders Placed This Encounter  Procedures  . Holter monitor - 48 hour     Disposition:   FU with me 6 months   Signed, Lauree Chandler, MD 02/06/2017 4:52 PM    [facility] [address], [place], [place]  [address] Phone: [phone]; Fax: [phone]

## 2017-02-07 ENCOUNTER — Encounter: Payer: Self-pay | Admitting: Thoracic Surgery (Cardiothoracic Vascular Surgery)

## 2017-02-10 ENCOUNTER — Ambulatory Visit (INDEPENDENT_AMBULATORY_CARE_PROVIDER_SITE_OTHER): Payer: Medicare Other

## 2017-02-10 DIAGNOSIS — I480 Paroxysmal atrial fibrillation: Secondary | ICD-10-CM

## 2017-02-17 ENCOUNTER — Encounter: Payer: Self-pay | Admitting: Cardiology

## 2017-02-21 ENCOUNTER — Telehealth: Payer: Self-pay | Admitting: Physician Assistant

## 2017-02-21 ENCOUNTER — Telehealth: Payer: Self-pay | Admitting: Family Medicine

## 2017-02-21 NOTE — Telephone Encounter (Signed)
Find out what exactly the PA told her.

## 2017-02-21 NOTE — Telephone Encounter (Signed)
She can take 2 of the over the counter Zantac daily and have her let us know if that helps her reflux symptoms

## 2017-02-21 NOTE — Telephone Encounter (Signed)
Received fax from patients insurance regarding chronic protonix use and warning of using >12 weeks unless clinically indicated. This was previously prescribed by our office due to h/o GERD and concomitant Plavix use as an alternative to Prilosec. Please ask patient to f/u with her PCP to discuss alternatives long-term to Protonix (such as Zantac/Pepcid). Dayna Dunn PA-C

## 2017-02-21 NOTE — Telephone Encounter (Signed)
Reached out to the pt to let her know that she needs to follow up with her pcp re: longterm use of Protonix and see if they have an alternative for her. She verbalized understanding.

## 2017-02-21 NOTE — Telephone Encounter (Signed)
Pt called and states Dayna Dunn PA-C at the cardiologist office advised pt insurance advised she needs to stop the Protonix immediately.  Please advise pt what to take [phone].  [facility] [address] of [place]

## 2017-02-21 NOTE — Telephone Encounter (Signed)
She was told by the [facility]  Not to take this meds pass 12 weeks because of research that is out  and that she will need to use otc zantac.

## 2017-02-22 NOTE — Telephone Encounter (Signed)
Pt was notified of Dr. Redmond School Recommendations

## 2017-02-23 ENCOUNTER — Telehealth: Payer: Self-pay | Admitting: Physician Assistant

## 2017-02-23 NOTE — Telephone Encounter (Signed)
New Message     Pt c/o medication issue:  1. Name of Medication:  protonix   2. How are you currently taking this medication (dosage and times per day)?  Not taking it   3. Are you having a reaction (difficulty breathing--STAT)? no  4. What is your medication issue? Pt daughter wants to know why they told her mother to stop taking this medication , she has been taking it for awhile and they do not understand why you are stopping it now

## 2017-02-24 NOTE — Telephone Encounter (Signed)
Returned [employer], DPR on file, pts daughter. She has been advised to speak with pt pcp re: chronic use of Protonix and discuss an alternative like Zantac or Pepcid.  She thanked me for the call back and verbalized understanding.

## 2017-02-28 ENCOUNTER — Ambulatory Visit (INDEPENDENT_AMBULATORY_CARE_PROVIDER_SITE_OTHER): Payer: Medicare Other | Admitting: Cardiology

## 2017-02-28 ENCOUNTER — Encounter: Payer: Self-pay | Admitting: Cardiology

## 2017-02-28 ENCOUNTER — Encounter (INDEPENDENT_AMBULATORY_CARE_PROVIDER_SITE_OTHER): Payer: Self-pay

## 2017-02-28 VITALS — BP 122/60 | HR 93 | Ht 65.0 in | Wt 115.2 lb

## 2017-02-28 DIAGNOSIS — I480 Paroxysmal atrial fibrillation: Secondary | ICD-10-CM

## 2017-02-28 DIAGNOSIS — I447 Left bundle-branch block, unspecified: Secondary | ICD-10-CM | POA: Diagnosis not present

## 2017-02-28 DIAGNOSIS — I440 Atrioventricular block, first degree: Secondary | ICD-10-CM | POA: Diagnosis not present

## 2017-02-28 DIAGNOSIS — I350 Nonrheumatic aortic (valve) stenosis: Secondary | ICD-10-CM | POA: Diagnosis not present

## 2017-02-28 DIAGNOSIS — I25708 Atherosclerosis of coronary artery bypass graft(s), unspecified, with other forms of angina pectoris: Secondary | ICD-10-CM | POA: Diagnosis not present

## 2017-02-28 MED ORDER — METOPROLOL TARTRATE 25 MG PO TABS: 12.5000 mg | ORAL_TABLET | Freq: Two times a day (BID) | ORAL | 3 refills | Status: DC

## 2017-02-28 NOTE — Patient Instructions (Signed)
Medication Instructions:  Your physician has recommended you make the following change in your medication:   1.)  Stop Diltiazem  2.)  Start Lopressor 12.5mg  twice daily   -- If you need a refill on your cardiac medications before your next appointment, please call your pharmacy. --  Labwork: None ordered  Testing/Procedures: None ordered  Follow-Up: Your physician wants you to follow-up in: 3 months Dr. Curt Bears.  You will receive a reminder letter in the mail two months in advance. If you don't receive a letter, please call our office to schedule the follow-up appointment.  Thank you for choosing [facility]!!    Any Other Special Instructions Will Be Listed Below (If Applicable).

## 2017-02-28 NOTE — Progress Notes (Signed)
Electrophysiology Office Note   Date:  02/28/2017   ID:  Lizandra, Zakrzewski 1928-11-28, MRN [id number]  PCP:  Denita Lung, MD  Cardiologist:  Angelena Form Primary Electrophysiologist: Lejend Dalby Meredith Leeds, MD    Chief Complaint  Patient presents with  . Advice Only    PAF/Heart block     History of Present Illness: [name] is a 83 y.o. female who is being seen today for the evaluation of atrial fibrillation at the request of Denita Lung, MD. Presenting today for electrophysiology evaluation. History of severe aortic stenosis status post TAVR, moderate carotid artery disease status post right CEA, coronary disease with stable angina status post CABG, paroxysmal atrial fibrillation status post maze, left bundle branch block after valve replacement. She was found to have a left atrial appendage thrombus on pre-TAVR workup and was thus put on Eliquis. She had refused this in the past.    Today, she denies symptoms of palpitations, chest pain, shortness of breath, orthopnea, PND, lower extremity edema, claudication, dizziness, presyncope, syncope, bleeding, or neurologic sequela. The patient is tolerating medications without difficulties. Her main symptoms are of dizziness and blurred vision. She says that her vision is blurred at times when she is seated, not changing position. She does not have presyncope or syncope. She were cardiac monitor that showed evidence of sinus pauses and possible Mobitz AV block   Past Medical History:  Diagnosis Date  . Anxiety   . Arthritis    OSTEO  . Aspirin allergy    on Plavix  . Asthma   . Bronchitis   . CAD (coronary artery disease)    a. s/p CABG 2006 with Cox-Maze procedure.  . Carotid artery disease ([place])    a. s/p R CEA.  . Chronic stable angina ([place])   . Diastolic dysfunction   . Eczema   . Fibromyalgia   . GERD (gastroesophageal reflux disease)   . Heart murmur   . Hiatal hernia   . Hyperlipidemia   . Hypertension   .  Kyphoscoliosis   . Mitral regurgitation   . Myocardial infarction [facility]) 2003   Stent to CFX  . PAF (paroxysmal atrial fibrillation) ([place])    a. pt has h/o hematuria on Eliquis and has since refused anticoagulation  . Pneumonia 2015 ?  Marland Kitchen Pulmonary regurgitation   . Severe aortic stenosis   . Skin cancer of arm   . Stroke ([place])   . Tricuspid regurgitation    Past Surgical History:  Procedure Laterality Date  . ABDOMINAL HYSTERECTOMY  1976  . CAROTID ENDARTERECTOMY  2010  . CORONARY ANGIOPLASTY WITH STENT PLACEMENT    . CORONARY ARTERY BYPASS GRAFT  01/2005   LIMA-D1; SVG-LAD; SVG-OM; SVG-PDA  . EYE SURGERY    . MULTIPLE EXTRACTIONS WITH ALVEOLOPLASTY  12/28/2016   Extraction of tooth #'s 2- 5,7-10, 12,13,17-20,and 22-29 with alveoloplasty and maxillary right and left lateral exostoses reductions  . MULTIPLE EXTRACTIONS WITH ALVEOLOPLASTY N/A 12/28/2016   Procedure: Extraction of tooth #'s 2- 5,7-10, 12,13,17-20,and 22-29 with alveoloplasty and maxillary right and left lateral exostoses reductions;  Surgeon: Lenn Cal, DDS;  Location: [facility];  Service: Oral Surgery;  Laterality: N/A;  . RIGHT/LEFT HEART CATH AND CORONARY/GRAFT ANGIOGRAPHY N/A 10/27/2016   Procedure: Right/Left Heart Cath and Coronary/Graft Angiography;  Surgeon: Burnell Blanks, MD;  Location: [facility] CV LAB;  Service: Cardiovascular;  Laterality: N/A;  . TEE WITHOUT CARDIOVERSION N/A 01/03/2017   Procedure: TRANSESOPHAGEAL ECHOCARDIOGRAM (TEE);  Surgeon: Burnell Blanks, MD;  Location: [facility];  Service: Open Heart Surgery;  Laterality: N/A;  . TONSILLECTOMY    . TRANSCATHETER AORTIC VALVE REPLACEMENT, TRANSFEMORAL N/A 01/03/2017   Procedure: TRANSCATHETER AORTIC VALVE REPLACEMENT, TRANSFEMORAL;  Surgeon: Burnell Blanks, MD;  Location: [facility];  Service: Open Heart Surgery;  Laterality: N/A;  . TUMOR REMOVAL       Current Outpatient Prescriptions  Medication Sig Dispense Refill  .  acetaminophen (TYLENOL) 650 MG CR tablet Take 1,300 mg by mouth 2 (two) times daily as needed for pain.     Marland Kitchen apixaban (ELIQUIS) 2.5 MG TABS tablet Take 1 tablet (2.5 mg total) by mouth 2 (two) times daily. 60 tablet 3  . calcium-vitamin D (OSCAL WITH D) 500-200 MG-UNIT per tablet Take 1 tablet by mouth daily with breakfast.    . cholecalciferol (VITAMIN D) 1000 UNITS tablet Take 1,000 Units by mouth daily.    Marland Kitchen diltiazem (CARDIZEM CD) 120 MG 24 hr capsule Take 1 capsule (120 mg total) by mouth daily. 30 capsule 6  . FLOVENT HFA 110 MCG/ACT inhaler inhale 1 puff by mouth twice a day 12 g 1  . fluticasone (FLONASE) 50 MCG/ACT nasal spray instill 2 sprays into each nostril once daily 16 g 3  . guaiFENesin (MUCINEX) 600 MG 12 hr tablet Take 1 tablet (600 mg total) by mouth 2 (two) times daily. 30 tablet 0  . guaiFENesin (ROBITUSSIN) 100 MG/5ML liquid Take 100 mg by mouth 3 (three) times daily as needed for cough. Reported on 12/08/2015    . HYDROcodone-acetaminophen (HYCET) 7.5-325 mg/15 ml solution Take 10-15 ML's by mouth every 6 hours as needed for moderate to severe pain. 180 mL 0  . irbesartan (AVAPRO) 75 MG tablet Take 1 tablet (75 mg total) by mouth daily. 90 tablet 1  . isosorbide dinitrate (ISORDIL) 30 MG tablet TAKE 1 TABLET BY MOUTH 2 TIMES DAILY 60 tablet 7  . loratadine (CLARITIN) 10 MG tablet Take 10 mg by mouth daily.      . nitroGLYCERIN (NITROSTAT) 0.4 MG SL tablet place 1 tablet UNDER THE TONGUE EVERY 5 MINUTES if needed for chest pain MAX OF 3 TABS 25 tablet 6  . pantoprazole (PROTONIX) 40 MG tablet Take 1 tablet (40 mg total) by mouth 2 (two) times daily. 180 tablet 3  . rosuvastatin (CRESTOR) 40 MG tablet take 1 tablet by mouth once daily (Patient taking differently: take 1 tablet by mouth once daily in the evening) 90 tablet 1  . VENTOLIN HFA 108 (90 Base) MCG/ACT inhaler INHALE 2 PUFFS BY MOUTH EVERY 6 HOURS AS NEEDED FOR WHEEZING OR SHORTNESS OF BREATH 18 Inhaler 5   No current  facility-administered medications for this visit.     Allergies:   Bactrim [sulfamethoxazole-trimethoprim]; Amitriptyline hcl; Aspirin; and Zetia [ezetimibe]   Social History:  The patient  reports that she has never smoked. She quit smokeless tobacco use about 15 years ago. Her smokeless tobacco use included Snuff. She reports that she does not drink alcohol or use drugs.   Family History:  The patient's family history includes Heart failure in her mother; Other in her father.    ROS:  Please see the history of present illness.   Otherwise, review of systems is positive for weight loss, chest pain, leg pain, palpitations, hearing loss, visual changes, cough, constipation, anxiety, back pain, muscle pain, joint problems, headaches, dizziness, easy bruising, balance problems.   All other systems are reviewed and negative.  PHYSICAL EXAM: VS:  BP 122/60   Pulse 93   Ht 5\' 5"  (1.651 m)   Wt 115 lb 3.2 oz (52.3 kg)   SpO2 96%   BMI 19.17 kg/m  , BMI Body mass index is 19.17 kg/m. GEN: Well nourished, well developed, in no acute distress  HEENT: normal  Neck: no JVD, carotid bruits, or masses Cardiac: RRR; no murmurs, rubs, or gallops,no edema  Respiratory:  clear to auscultation bilaterally, normal work of breathing GI: soft, nontender, nondistended, + BS MS: no deformity or atrophy  Skin: warm and dry Neuro:  Strength and sensation are intact Psych: euthymic mood, full affect  EKG:  EKG is not ordered today. Personal review of the ekg ordered 01/12/17 shows SR, LBBB, 1 degree AV block  Recent Labs: 12/26/2016: ALT 68 01/04/2017: Magnesium 2.3 01/05/2017: BUN 11; Creatinine, Ser 0.46; Hemoglobin 9.7; Platelets 187; Potassium 3.3; Sodium 141    Lipid Panel     Component Value Date/Time   CHOL 144 08/21/2015 0001   TRIG 87 08/21/2015 0001   HDL 73 08/21/2015 0001   CHOLHDL 2.0 08/21/2015 0001   VLDL 17 08/21/2015 0001   LDLCALC 54 08/21/2015 0001     Wt Readings from Last  3 Encounters:  02/28/17 115 lb 3.2 oz (52.3 kg)  02/06/17 116 lb 12.8 oz (53 kg)  01/12/17 118 lb 1.9 oz (53.6 kg)      Other studies Reviewed: Additional studies/ records that were reviewed today include: TTE 02/06/17  Review of the above records today demonstrates:  - Left ventricle: The cavity size was normal. Systolic function was   normal. The estimated ejection fraction was in the range of 60%   to 65%. Wall motion was normal; there were no regional wall   motion abnormalities. The study was not technically sufficient to   allow evaluation of LV diastolic dysfunction due to atrial   fibrillation. - Aortic valve: A TAVR bioprosthetic valve is present (23 mm   Edwards-SAPIEN). There was no regurgitation. Mean gradient (S): 8   mm Hg. Peak gradient (S): 19 mm Hg. - Aortic root: The aortic root was normal in size. - Mitral valve: Calcified annulus. Moderately thickened, moderately   calcified leaflets . There was mild regurgitation. - Left atrium: The atrium was normal in size. - Right ventricle: Systolic function was normal. - Tricuspid valve: There was moderate regurgitation. - Pulmonic valve: There was mild regurgitation. - Pulmonary arteries: Systolic pressure was mildly increased. PA   peak pressure: 38 mm Hg (S). - Inferior vena cava: The vessel was normal in size. - Pericardium, extracardiac: There was no pericardial effusion.  Holter 02/15/17 - personally reviewed Sinus bradycardia with lowest heart rate 48 bpm Premature ventricular contractions Premature atrial contractions Transient second degree AV block.   ASSESSMENT AND PLAN:  1.  Paroxysmal atrial fibrillation: Currently on Eliquis and diltiazem 120 mg status post maze procedure.  This patients CHA2DS2-VASc Score and unadjusted Ischemic Stroke Rate (% per year) is equal to 10.8 % stroke rate/year from a score of 8  Above score calculated as 1 point each if present [CHF, HTN, DM, Vascular=MI/PAD/Aortic Plaque,  Age if 65-74, or Female] Above score calculated as 2 points each if present [Age > 75, or Stroke/TIA/TE]  2. Severe aortic stenosis: s/p TAVR  3. Coronary artery disease status post CABG with stable angina: No current chest pain. Continue current management.  4. Dizziness/sinus bradycardia: On therapy for atrial fibrillation with diltiazem 120 mg. Cardiac monitor showed  heart rate of 48 at the lowest. It is unclear as to whether or not her symptoms are due to slow heart rates. Based on the monitor, she is having episodes of sinus pauses lasting up to second and a half. Due to that, would plan to stop diltiazem and start 12.5 mg of metoprolol. She does have many complaints of anxiety with medical bills and does not wish to have further procedures at this time unless absolutely necessary. We'll see her back in 3 months.    Current medicines are reviewed at length with the patient today.   The patient does not have concerns regarding her medicines.  The following changes were made today:  Diltiazem, start metoprolol  Labs/ tests ordered today include:  No orders of the defined types were placed in this encounter.    Disposition:   FU with Mykenna Viele 3 months  Signed, Jazsmin Couse Meredith Leeds, MD  02/28/2017 3:26 PM     [facility] [address] [place] [address] [phone] (office) [phone] (fax)

## 2017-03-03 DIAGNOSIS — Z23 Encounter for immunization: Secondary | ICD-10-CM | POA: Diagnosis not present

## 2017-03-06 DIAGNOSIS — I639 Cerebral infarction, unspecified: Secondary | ICD-10-CM | POA: Diagnosis present

## 2017-03-09 ENCOUNTER — Other Ambulatory Visit: Payer: Self-pay | Admitting: Family Medicine

## 2017-03-24 ENCOUNTER — Ambulatory Visit (INDEPENDENT_AMBULATORY_CARE_PROVIDER_SITE_OTHER): Payer: Medicare Other | Admitting: Cardiovascular Disease

## 2017-03-24 VITALS — BP 120/60 | HR 76 | Ht 65.0 in | Wt 115.0 lb

## 2017-03-24 DIAGNOSIS — I480 Paroxysmal atrial fibrillation: Secondary | ICD-10-CM

## 2017-03-24 DIAGNOSIS — R42 Dizziness and giddiness: Secondary | ICD-10-CM

## 2017-03-24 DIAGNOSIS — Z952 Presence of prosthetic heart valve: Secondary | ICD-10-CM

## 2017-03-24 DIAGNOSIS — I447 Left bundle-branch block, unspecified: Secondary | ICD-10-CM | POA: Diagnosis not present

## 2017-03-24 DIAGNOSIS — I209 Angina pectoris, unspecified: Secondary | ICD-10-CM

## 2017-03-24 DIAGNOSIS — I25119 Atherosclerotic heart disease of native coronary artery with unspecified angina pectoris: Secondary | ICD-10-CM

## 2017-03-24 DIAGNOSIS — I340 Nonrheumatic mitral (valve) insufficiency: Secondary | ICD-10-CM

## 2017-03-24 DIAGNOSIS — I6523 Occlusion and stenosis of bilateral carotid arteries: Secondary | ICD-10-CM

## 2017-03-24 DIAGNOSIS — I350 Nonrheumatic aortic (valve) stenosis: Secondary | ICD-10-CM

## 2017-03-24 MED ORDER — AMOXICILLIN 500 MG PO TABS: ORAL_TABLET | ORAL | 4 refills | Status: DC

## 2017-03-24 NOTE — Patient Instructions (Addendum)
Medication Instructions:  Your physician recommends that you continue on your current medications as directed. Please refer to the Current Medication list given to you today.  Take amoxicillin prior to dental appointments.    Labwork: none  Testing/Procedures: none  Follow-Up: Your physician recommends that you schedule a follow-up appointment in: 4 months. --Scheduled for March 1,2019 at 2:20    Any Other Special Instructions Will Be Listed Below (If Applicable).     If you need a refill on your cardiac medications before your next appointment, please call your pharmacy.

## 2017-03-24 NOTE — Progress Notes (Addendum)
Chief Complaint  Patient presents with  . Coronary Artery Disease    History of Present Illness: 83 yo female with history of CAD s/p CABG, atrial fib s/p MAZE procedure, CVA, carotid artery disease s/p right CEA, fibromyalgia, GERD, moderate MR, severe AS s/p TAVR who is here today for one month TAVR follow up. She underwent CABG in 2006 with MAZE procedure. She is known to have moderate carotid disease by last dopplers in June 2018. She was found to have severe AS by echo in July 2017 along with moderate MR. We discussed her aortic valve disease at that time but she did not wish to pursue further evaluation. She is known to have atrial fibrillation but refused anticoagulation due to hematuria when on Eliquis in 2015. In the spring of 2018 she began to have dyspnea and chest pain. Echo 10/14/16 with normal LV systolic function, mild mitral regurgitation and severe aortic stenosis (mean gradient 54 mm Hg peak gradient 101 mmHg). I was able to talk her into considering TAVR. Cardiac cath 10/27/16 with 3/4 patent bypass grafts. The LIMA to the Diagonal was atretic but vein grafts were open to the LAD, Circumflex and distal RCA. She was found to have a LAA thrombus on pre-operative testing and was started on Eliquis. She underwent TAVR on 01/03/17 with placement of a 23 mm Edwards Sapien 3 valve from the right femoral artery. She reported episodes of dizziness at her f/u with me in September 2018 and cardiac monitor showed sinus pauses and possible second degree AV block. She was seen in EP clinic by Dr. Lennie Odor and her Cardizem was changed to Lopressor.   She is here today for follow up. The patient denies any chest pain, dyspnea, palpitations, lower extremity edema, orthopnea, PND, dizziness, near syncope or syncope.    Primary Care Physician: Denita Lung, MD   Past Medical History:  Diagnosis Date  . Anxiety   . Arthritis    OSTEO  . Aspirin allergy    on Plavix  . Asthma   . Bronchitis     . CAD (coronary artery disease)    a. s/p CABG 2006 with Cox-Maze procedure.  . Carotid artery disease ([place])    a. s/p R CEA.  . Chronic stable angina ([place])   . Diastolic dysfunction   . Eczema   . Fibromyalgia   . GERD (gastroesophageal reflux disease)   . Heart murmur   . Hiatal hernia   . Hyperlipidemia   . Hypertension   . Kyphoscoliosis   . Mitral regurgitation   . Myocardial infarction [facility]) 2003   Stent to CFX  . PAF (paroxysmal atrial fibrillation) ([place])    a. pt has h/o hematuria on Eliquis and has since refused anticoagulation  . Pneumonia 2015 ?  Marland Kitchen Pulmonary regurgitation   . Severe aortic stenosis   . Skin cancer of arm   . Stroke ([place])   . Tricuspid regurgitation     Past Surgical History:  Procedure Laterality Date  . ABDOMINAL HYSTERECTOMY  1976  . CAROTID ENDARTERECTOMY  2010  . CORONARY ANGIOPLASTY WITH STENT PLACEMENT    . CORONARY ARTERY BYPASS GRAFT  01/2005   LIMA-D1; SVG-LAD; SVG-OM; SVG-PDA  . EYE SURGERY    . MULTIPLE EXTRACTIONS WITH ALVEOLOPLASTY  12/28/2016   Extraction of tooth #'s 2- 5,7-10, 12,13,17-20,and 22-29 with alveoloplasty and maxillary right and left lateral exostoses reductions  . MULTIPLE EXTRACTIONS WITH ALVEOLOPLASTY N/A 12/28/2016   Procedure: Extraction of tooth #'s 2-  5,7-10, 12,13,17-20,and 22-29 with alveoloplasty and maxillary right and left lateral exostoses reductions;  Surgeon: Lenn Cal, DDS;  Location: [facility];  Service: Oral Surgery;  Laterality: N/A;  . RIGHT/LEFT HEART CATH AND CORONARY/GRAFT ANGIOGRAPHY N/A 10/27/2016   Procedure: Right/Left Heart Cath and Coronary/Graft Angiography;  Surgeon: Burnell Blanks, MD;  Location: [facility] CV LAB;  Service: Cardiovascular;  Laterality: N/A;  . TEE WITHOUT CARDIOVERSION N/A 01/03/2017   Procedure: TRANSESOPHAGEAL ECHOCARDIOGRAM (TEE);  Surgeon: Burnell Blanks, MD;  Location: [facility];  Service: Open Heart Surgery;  Laterality: N/A;  . TONSILLECTOMY     . TRANSCATHETER AORTIC VALVE REPLACEMENT, TRANSFEMORAL N/A 01/03/2017   Procedure: TRANSCATHETER AORTIC VALVE REPLACEMENT, TRANSFEMORAL;  Surgeon: Burnell Blanks, MD;  Location: [facility];  Service: Open Heart Surgery;  Laterality: N/A;  . TUMOR REMOVAL      Current Outpatient Prescriptions  Medication Sig Dispense Refill  . acetaminophen (TYLENOL) 650 MG CR tablet Take 1,300 mg by mouth 2 (two) times daily as needed for pain.     Marland Kitchen apixaban (ELIQUIS) 2.5 MG TABS tablet Take 1 tablet (2.5 mg total) by mouth 2 (two) times daily. 60 tablet 3  . calcium-vitamin D (OSCAL WITH D) 500-200 MG-UNIT per tablet Take 1 tablet by mouth daily with breakfast.    . cholecalciferol (VITAMIN D) 1000 UNITS tablet Take 1,000 Units by mouth daily.    Marland Kitchen FLOVENT HFA 110 MCG/ACT inhaler INHALE 1 PUFF BY MOUTH TWICE A DAY 12 g 0  . fluticasone (FLONASE) 50 MCG/ACT nasal spray INSTILL 2 SPRAYS INTO EACH NOSTRIL ONCE DAILY 1 g 0  . guaiFENesin (MUCINEX) 600 MG 12 hr tablet Take 1 tablet (600 mg total) by mouth 2 (two) times daily. 30 tablet 0  . guaiFENesin (ROBITUSSIN) 100 MG/5ML liquid Take 100 mg by mouth 3 (three) times daily as needed for cough. Reported on 12/08/2015    . irbesartan (AVAPRO) 75 MG tablet Take 1 tablet (75 mg total) by mouth daily. 90 tablet 1  . isosorbide dinitrate (ISORDIL) 30 MG tablet TAKE 1 TABLET BY MOUTH 2 TIMES DAILY 60 tablet 7  . loratadine (CLARITIN) 10 MG tablet Take 10 mg by mouth daily.      . metoprolol tartrate (LOPRESSOR) 25 MG tablet Take 0.5 tablets (12.5 mg total) by mouth 2 (two) times daily. 30 tablet 3  . nitroGLYCERIN (NITROSTAT) 0.4 MG SL tablet place 1 tablet UNDER THE TONGUE EVERY 5 MINUTES if needed for chest pain MAX OF 3 TABS 25 tablet 6  . pantoprazole (PROTONIX) 40 MG tablet Take 1 tablet (40 mg total) by mouth 2 (two) times daily. 180 tablet 3  . rosuvastatin (CRESTOR) 40 MG tablet take 1 tablet by mouth once daily (Patient taking differently: take 1 tablet by  mouth once daily in the evening) 90 tablet 1  . VENTOLIN HFA 108 (90 Base) MCG/ACT inhaler INHALE 2 PUFFS BY MOUTH EVERY 6 HOURS AS NEEDED FOR WHEEZING OR SHORTNESS OF BREATH 18 Inhaler 5   No current facility-administered medications for this visit.     Allergies  Allergen Reactions  . Bactrim [Sulfamethoxazole-Trimethoprim] Other (See Comments)    "makes her feel funny" or "unsteady"   . Amitriptyline Hcl Rash  . Aspirin Swelling and Rash    SWELLING REACTION UNSPECIFIED   . Zetia [Ezetimibe] Rash    Social History   Social History  . Marital status: Widowed    Spouse name: N/A  . Number of children: 3  . Years  of education: N/A   Occupational History  .  Retired   Social History Main Topics  . Smoking status: Never Smoker  . Smokeless tobacco: Former Systems developer    Types: Snuff    Quit date: 05/23/2001  . Alcohol use No  . Drug use: No  . Sexual activity: Not Currently   Other Topics Concern  . Not on file   Social History Narrative  . No narrative on file    Family History  Problem Relation Age of Onset  . Heart failure Mother   . Other Father     Review of Systems:  As stated in the HPI and otherwise negative.   BP 120/60   Pulse 76   Ht 5\' 5"  (1.651 m)   Wt 115 lb (52.2 kg)   SpO2 97%   BMI 19.14 kg/m   Physical Examination: General: Well developed, well nourished, NAD  HEENT: OP clear, mucus membranes moist  SKIN: warm, dry. No rashes. Neuro: No focal deficits  Musculoskeletal: Muscle strength 5/5 all ext  Psychiatric: Mood and affect normal  Neck: No JVD, no carotid bruits, no thyromegaly, no lymphadenopathy.  Lungs:Clear bilaterally, no wheezes, rhonci, crackles Cardiovascular: Regular rate and rhythm. Systolic murmur noted. No gallops or rubs. Abdomen:Soft. Bowel sounds present. Non-tender.  Extremities: No lower extremity edema. Pulses are 2 + in the bilateral DP/PT.  Echo 02/03/17: Left ventricle: The cavity size was normal. Systolic  function was   normal. The estimated ejection fraction was in the range of 60%   to 65%. Wall motion was normal; there were no regional wall   motion abnormalities. The study was not technically sufficient to   allow evaluation of LV diastolic dysfunction due to atrial   fibrillation. - Aortic valve: A TAVR bioprosthetic valve is present (23 mm   Edwards-SAPIEN). There was no regurgitation. Mean gradient (S): 8   mm Hg. Peak gradient (S): 19 mm Hg. - Aortic root: The aortic root was normal in size. - Mitral valve: Calcified annulus. Moderately thickened, moderately   calcified leaflets . There was mild regurgitation. - Left atrium: The atrium was normal in size. - Right ventricle: Systolic function was normal. - Tricuspid valve: There was moderate regurgitation. - Pulmonic valve: There was mild regurgitation. - Pulmonary arteries: Systolic pressure was mildly increased. PA   peak pressure: 38 mm Hg (S). - Inferior vena cava: The vessel was normal in size. - Pericardium, extracardiac: There was no pericardial effusion.  Impressions:  - Ni significant change since the prior study on 01/04/2017,   transaortic gradients across the bioprosthetic valve remain   unchanged and are normal for this type of prosthesis. There is no   central aortic regurgitation or paravalvular leak.  EKG:  EKG is not ordered today. The ekg ordered today demonstrates  Recent Labs: 12/26/2016: ALT 68 01/04/2017: Magnesium 2.3 01/05/2017: BUN 11; Creatinine, Ser 0.46; Hemoglobin 9.7; Platelets 187; Potassium 3.3; Sodium 141   Lipid Panel    Component Value Date/Time   CHOL 144 08/21/2015 0001   TRIG 87 08/21/2015 0001   HDL 73 08/21/2015 0001   CHOLHDL 2.0 08/21/2015 0001   VLDL 17 08/21/2015 0001   LDLCALC 54 08/21/2015 0001     Wt Readings from Last 3 Encounters:  03/24/17 115 lb (52.2 kg)  02/28/17 115 lb 3.2 oz (52.3 kg)  02/06/17 116 lb 12.8 oz (53 kg)     Other studies Reviewed: Additional  studies/ records that were reviewed today include: . Review of  the above records demonstrates:   Assessment and Plan:   1. Severe aortic valve stenosis: She is doing well post TAVR in August 2018 with a 23 mm Edwards Sapien 3 valve placed from the right femoral artery. She is on Eliquis. She has new LBBB post TAVR. 48 hour cardiac monitor in September 2018 showed sinus pauses and possible second degree AV block. She was seen in EP and her Cardizem was changed to Lopressor. She has had improvement in dizziness. No inidication for pacemaker. She wishes to follow up with Dr. Lennie Odor as planned.   She will need antibiotic prophylaxis prior to dental visits.   2. Carotid artery disease: Moderate bilateral disease by dopplers 2018. -Repeat in 2019  3. Mitral regurgitation: Mild by echo in 2018. Will follow  4. CAD with stable angina: No change in chest pain c/w her angina. CAD stable by cath in June 2018.  -Continue beta blocker, Isordil and statin.      5. Atrial fibrillation,paroxysmal: She is in sinus today. Will continue Lopressor and Eliquis.    6. LBBB: new post TAVR. Unclear clinical significance.   7. Dizziness/bradycardia: She has been seen in EP following a cardiac monitor that showed sinus pauses and possible second degree AV block. Cardizem stopped and Lopressor started. Dizziness improved. She wishes to follow up as planned with Dr. Lennie Odor in EP clinic.    Current medicines are reviewed at length with the patient today.  The patient does not have concerns regarding medicines.  The following changes have been made:  no change  Labs/ tests ordered today include:   No orders of the defined types were placed in this encounter.    Disposition:   FU with me 6 months   Signed, Lauree Chandler, MD 03/24/2017 3:46 PM    [address], [place], [place]  [id number] Phone: [phone]; Fax: [phone]

## 2017-03-27 ENCOUNTER — Encounter: Payer: Self-pay | Admitting: Family Medicine

## 2017-03-27 ENCOUNTER — Telehealth: Payer: Self-pay

## 2017-03-27 ENCOUNTER — Ambulatory Visit (INDEPENDENT_AMBULATORY_CARE_PROVIDER_SITE_OTHER): Payer: Medicare Other | Admitting: Family Medicine

## 2017-03-27 VITALS — BP 124/70 | HR 78 | Resp 16 | Wt 110.8 lb

## 2017-03-27 DIAGNOSIS — M1990 Unspecified osteoarthritis, unspecified site: Secondary | ICD-10-CM

## 2017-03-27 DIAGNOSIS — R42 Dizziness and giddiness: Secondary | ICD-10-CM | POA: Diagnosis not present

## 2017-03-27 DIAGNOSIS — I6523 Occlusion and stenosis of bilateral carotid arteries: Secondary | ICD-10-CM

## 2017-03-27 DIAGNOSIS — I480 Paroxysmal atrial fibrillation: Secondary | ICD-10-CM

## 2017-03-27 DIAGNOSIS — K219 Gastro-esophageal reflux disease without esophagitis: Secondary | ICD-10-CM | POA: Diagnosis not present

## 2017-03-27 MED ORDER — PANTOPRAZOLE SODIUM 40 MG PO TBEC
40.0000 mg | DELAYED_RELEASE_TABLET | Freq: Every day | ORAL | 3 refills | Status: DC
Start: 2017-03-27 — End: 2017-05-11

## 2017-03-27 MED ORDER — DIAZEPAM 2 MG PO TABS: 2.0000 mg | ORAL_TABLET | Freq: Four times a day (QID) | ORAL | 0 refills | Status: DC | PRN

## 2017-03-27 MED ORDER — TRAMADOL HCL 50 MG PO TABS: 50.0000 mg | ORAL_TABLET | Freq: Three times a day (TID) | ORAL | 1 refills | Status: DC | PRN

## 2017-03-27 NOTE — Progress Notes (Signed)
[name] is a 83 y.o. female who presents for annual wellness visit and follow-up on chronic medical conditions.  She has the following concerns: She has had all of teeth pulled prior to having valvular surgery. She has had difficulty maintaining her weight. She does have underlying reflux disease and presently is on twice a day dosing of pantoprazole but has not had any GI symptoms in quite some time. She would also like a refill on her tramadol to help with generalized arthritic aches and pains. She also has difficulty with dizziness and finds that Valium and low doses works quite well for that. She has been stable on this for years.  Immunizations and Health Maintenance Immunization History  Administered Date(s) Administered  . Influenza Split 03/28/2012, 04/28/2015  . Influenza Whole 02/23/2009  . Influenza, High Dose Seasonal PF 03/14/2013  . Pneumococcal Conjugate-13 06/14/2013  . Pneumococcal Polysaccharide-23 12/22/2008  . Tdap 10/11/2006  . Zoster 01/01/2009   Health Maintenance Due  Topic Date Due  . DEXA SCAN  03/04/1996  . TETANUS/TDAP  10/10/2016  . INFLUENZA VACCINE  12/21/2016    Last Pap smear:  No longer gets Last mammogram:  No longer gets  Last colonoscopy:  4-5 yrs ago - Dr. Collene Mares  Last DEXA: No longer gets  Dentist: Dr. Dorothyann Gibbs.  Ophtho:  Has not been in a while  Exercise: no routine exercise   Other doctors caring for patient include: Dr. Rosine Beat - cardio  Advanced directives: none on file. Patient not interested in this. Her daughter is her primary caregiver.    Depression screen:  See questionnaire below.  Depression screen [facility] 2/9 03/27/2017 08/21/2015 01/06/2014  Decreased Interest 0 0 0  Down, Depressed, Hopeless 0 0 0  PHQ - 2 Score 0 0 0  Some recent data might be hidden    Fall Risk Screen: see questionnaire below. Fall Risk  03/27/2017 08/21/2015 01/06/2014  Falls in the past year? No Yes No  Number falls in past yr: - 1 -  Injury with Fall?  - No -  Risk for fall due to : History of fall(s) - -  Follow up - Falls evaluation completed -  She is in a wheelchair.  ADL screen:  See questionnaire below Functional Status Survey:  physical activity limited due to general health condition.   Review of Systems Negative except as above   PHYSICAL EXAM:  BP 124/70   Pulse 78   Resp 16   Wt 110 lb 12.8 oz (50.3 kg)   SpO2 98%   BMI 18.44 kg/m   General Appearance: Alert, cooperative, no distress, appears stated age Head: Normocephalic, without obvious abnormality, atraumatic Eyes: PERRL, conjunctiva/corneas clear, EOM's intact,  Ears: Normal TM's and external ear canals Nose: Nares normal, mucosa normal, no drainage or sinus tenderness Throat: Lips, mucosa, and tongue normal; teeth and gums normal  Lungs: Clear to auscultation bilaterally without wheezes, rales or ronchi; respirations unlabored Heart: Regular rate and rhythm, S1 and S2 normal, no murmur, rubor gallop Skin:  Skin color, texture, turgor normal, no rashes or lesions l Neurologic:  CNII-XII intact, normal strength, sensation and gait; reflexes 2+ and symmetric throughout Psych: Normal mood, affect, hygiene and grooming.  ASSESSMENT/PLAN: PAF (paroxysmal atrial fibrillation) (HCC)  Arthritis - Plan: traMADol (ULTRAM) 50 MG tablet  Gastroesophageal reflux disease without esophagitis - Plan: pantoprazole (PROTONIX) 40 MG tablet  Vertigo - Plan: diazepam (VALIUM) 2 MG tablet At the present time, she was in a regular rhythm and  I heard no murmurs. I will renew her medications. Overall she seems to be holding her own. Suggested she get the shingles as well as TDaP     Medicare Attestation I have personally reviewed: The patient's medical and social history Their use of alcohol, tobacco or illicit drugs Their current medications and supplements The patient's functional ability including ADLs,fall risks, home safety risks, cognitive, and hearing and  visual impairment Diet and physical activities Evidence for depression or mood disorders  The patient's weight, height, and BMI have been recorded in the chart.  I have made referrals, counseling, and provided education to the patient based on review of the above and I have provided the patient with a written personalized care plan for preventive services.     Wyatt Haste, MD   03/27/2017

## 2017-03-27 NOTE — Patient Instructions (Addendum)
Cut down on the pantoprazole to once a day and if that controls her symptoms and you can take it every other day  Ms. [name] , Thank you for taking time to come for your Medicare Wellness Visit. I appreciate your ongoing commitment to your health goals. Please review the following plan we discussed and let me know if I can assist you in the future.   These are the goals we discussed: Goals    None      This is a list of the screening recommended for you and due dates:  Health Maintenance  Topic Date Due  . DEXA scan (bone density measurement)  03/04/1996  . Tetanus Vaccine  10/10/2016  . Flu Shot  12/21/2016  . Pneumonia vaccines  Completed

## 2017-03-27 NOTE — Telephone Encounter (Signed)
Called in tramadol and diazepam to [facility].

## 2017-04-01 ENCOUNTER — Telehealth: Payer: Self-pay | Admitting: Family Medicine

## 2017-04-01 NOTE — Telephone Encounter (Signed)
P.A. DIAZEPAM

## 2017-04-03 NOTE — Telephone Encounter (Signed)
P.A. Denied for diagnosis of vertigo.  Called pt and spoke with daughter [name] and she stated they already paid out of pocket was not expensive.  And she only takes as needed.

## 2017-04-05 ENCOUNTER — Other Ambulatory Visit: Payer: Self-pay | Admitting: Family Medicine

## 2017-04-05 ENCOUNTER — Telehealth: Payer: Self-pay | Admitting: Family Medicine

## 2017-04-05 DIAGNOSIS — N3001 Acute cystitis with hematuria: Secondary | ICD-10-CM

## 2017-04-05 MED ORDER — NITROFURANTOIN MONOHYD MACRO 100 MG PO CAPS: 100.0000 mg | ORAL_CAPSULE | Freq: Two times a day (BID) | ORAL | 0 refills | Status: DC

## 2017-04-05 NOTE — Telephone Encounter (Signed)
Let her know that I called the antibiotic in

## 2017-04-05 NOTE — Telephone Encounter (Signed)
Pt called & states she is having urinary symptoms again same as she had in August (burning and small amounts and frequency) when she saw Vickie & has the same thing again and wants to know if she can have a refill on the NITROFURANTOIN MONO/MAC 100MG CAPS to [facility]

## 2017-04-05 NOTE — Telephone Encounter (Signed)
Pt aware that rx called in. [name]

## 2017-04-09 ENCOUNTER — Other Ambulatory Visit: Payer: Self-pay | Admitting: Family Medicine

## 2017-04-19 ENCOUNTER — Encounter: Payer: Self-pay | Admitting: Cardiology

## 2017-04-21 ENCOUNTER — Other Ambulatory Visit: Payer: Self-pay | Admitting: Cardiovascular Disease

## 2017-04-23 ENCOUNTER — Other Ambulatory Visit: Payer: Self-pay | Admitting: Cardiology

## 2017-04-28 ENCOUNTER — Other Ambulatory Visit: Payer: Self-pay | Admitting: Cardiovascular Disease

## 2017-05-06 ENCOUNTER — Other Ambulatory Visit: Payer: Self-pay | Admitting: Family Medicine

## 2017-05-07 ENCOUNTER — Other Ambulatory Visit: Payer: Self-pay | Admitting: Physician Assistant

## 2017-05-07 DIAGNOSIS — K219 Gastro-esophageal reflux disease without esophagitis: Secondary | ICD-10-CM

## 2017-05-08 NOTE — Telephone Encounter (Signed)
Order Providers   Prescribing Provider Encounter Provider  Denita Lung, MD Denita Lung, MD  Medication Detail    Disp Refills Start End   pantoprazole (PROTONIX) 40 MG tablet 90 tablet 3 03/27/2017    Sig - Route: Take 1 tablet (40 mg total) daily by mouth. - Oral   Sent to pharmacy as: pantoprazole (PROTONIX) 40 MG tablet   E-Prescribing Status: Receipt confirmed by pharmacy (03/27/2017 3:12 PM EST)   Associated Diagnoses   Gastroesophageal reflux disease without esophagitis     Pharmacy   [id number] - [place], [place] - [address] AT [address]

## 2017-05-11 ENCOUNTER — Telehealth: Payer: Self-pay | Admitting: Family Medicine

## 2017-05-11 DIAGNOSIS — K219 Gastro-esophageal reflux disease without esophagitis: Secondary | ICD-10-CM

## 2017-05-11 MED ORDER — PANTOPRAZOLE SODIUM 40 MG PO TBEC: 40.0000 mg | DELAYED_RELEASE_TABLET | Freq: Two times a day (BID) | ORAL | 1 refills | Status: DC

## 2017-05-11 NOTE — Telephone Encounter (Signed)
Pt requesting that directions on Pantoprazole 40 mg be changes to take twice a day. She has been eating lots of bananas and sweet potatoes for her heart health those items seem to aggravate her reflux

## 2017-05-11 NOTE — Telephone Encounter (Signed)
ok 

## 2017-05-11 NOTE — Telephone Encounter (Signed)
Spoke with pt- increased dose called in per pt request. [name]

## 2017-06-05 ENCOUNTER — Ambulatory Visit: Payer: Self-pay | Admitting: Cardiology

## 2017-06-07 ENCOUNTER — Telehealth: Payer: Self-pay | Admitting: Cardiovascular Disease

## 2017-06-07 NOTE — Telephone Encounter (Signed)
New Message  Pt call requesting to speak with RN about getting a letter to medicare her insurance coming. Pt states she would like to explain what she needs to RN upon call back .

## 2017-06-07 NOTE — Telephone Encounter (Signed)
I spoke with pt. She has spoken with medicare and was told medicare needed documentation that teeth removal was needed prior to surgery.  I told her I would forward to our precert/billing department and someone would contact her.

## 2017-06-07 NOTE — Telephone Encounter (Signed)
Indicated to RN that letter of medical necessity is needed for the teeth removal.  She will contact patient.

## 2017-06-12 ENCOUNTER — Encounter: Payer: Self-pay | Admitting: Physician Assistant

## 2017-06-12 NOTE — Telephone Encounter (Signed)
I spoke with [name] in triage and he will place letter at the front desk for pick up.  I contacted the pt's daughter and made her aware that letter is ready and she will try to come by the office tomorrow.

## 2017-06-12 NOTE — Telephone Encounter (Signed)
Letter of medical necessity written by Nell Range PA-C. I will contact the pt to make her aware that letter can be picked up at the front desk at [facility] office.

## 2017-06-13 ENCOUNTER — Other Ambulatory Visit: Payer: Self-pay | Admitting: Family Medicine

## 2017-06-13 NOTE — Telephone Encounter (Signed)
Is this okay to refill? 

## 2017-06-15 ENCOUNTER — Telehealth: Payer: Self-pay | Admitting: Cardiovascular Disease

## 2017-06-15 NOTE — Telephone Encounter (Signed)
I spoke with the pt and she did get the letter of medical necessity from our office in regards to dental extraction.  The pt does not have know who she is suppose to send this information to at [facility].  The pt spent most of yesterday on the phone with Medicare and she could not get any assistance.  The pt is frustrated with this process.  I advised her that I will reach out to our billing department to see if they can assist her with this issue.

## 2017-06-15 NOTE — Telephone Encounter (Signed)
Mrs. Reimann is returning your call . Thanks

## 2017-06-15 NOTE — Telephone Encounter (Signed)
I spoke with her daughter, [name].  She was stating that medicare has not received a bill/statement of the dental extraction and that was part of the reason for the denial. She was also asking where to send the letter of medical necessity.  I gave her the # and advised her to [facility] to see if they can get that info sent to medicare.  I faxed the letter of medical necessity from Richrd Humbles to medicare at [phone].  I gave [name] my direct line incase she had any further questions.

## 2017-06-26 ENCOUNTER — Telehealth: Payer: Self-pay

## 2017-06-26 NOTE — Telephone Encounter (Signed)
Called pharmacy back to clear up a script for her tdap  Injection. Given ino was date o last injection. Thanks kH

## 2017-06-28 ENCOUNTER — Other Ambulatory Visit: Payer: Self-pay | Admitting: Cardiology

## 2017-07-05 ENCOUNTER — Telehealth: Payer: Self-pay | Admitting: Family Medicine

## 2017-07-05 NOTE — Telephone Encounter (Signed)
Recv'd fax approval for Shingrix and TDVAX.  I called [facility] [phone] and verified both are covered but must still get at her pharmacy.

## 2017-07-05 NOTE — Telephone Encounter (Signed)
Forwarding to [place] to see call pharmacy about Td and see if she got Shingrix also for abstraction

## 2017-07-06 ENCOUNTER — Telehealth: Payer: Self-pay

## 2017-07-06 ENCOUNTER — Other Ambulatory Visit: Payer: Self-pay | Admitting: Family Medicine

## 2017-07-06 NOTE — Telephone Encounter (Signed)
Pt hasn't been in for lipid since 2017. Is this okay to refill or pt need to come in

## 2017-07-06 NOTE — Telephone Encounter (Signed)
Called pt to ask about the vaccine  tdap and shingrix. Will also call the pharmacy to see if it was already given. Thanks [employer]

## 2017-07-06 NOTE — Telephone Encounter (Signed)
Pt says she has not had the shingrix and tdap. But insurace will pay for them. Told pt she could come here for tdap but shingrix is on back order for [place] as well. Pt will call back and make the appt . Thanks kH

## 2017-07-07 NOTE — Telephone Encounter (Signed)
Have a correction . Pt has to have both injections done at her pharmacy due to insurance per Mickel Baas thanks [facility]

## 2017-07-21 ENCOUNTER — Ambulatory Visit: Payer: Self-pay | Admitting: Cardiovascular Disease

## 2017-07-27 ENCOUNTER — Other Ambulatory Visit: Payer: Self-pay | Admitting: Family Medicine

## 2017-08-23 ENCOUNTER — Other Ambulatory Visit: Payer: Self-pay | Admitting: Family Medicine

## 2017-08-25 ENCOUNTER — Other Ambulatory Visit: Payer: Self-pay | Admitting: Family Medicine

## 2017-08-25 NOTE — Telephone Encounter (Signed)
[facility] [address] is requesting to fill flovant hfa. Please advise [facility]

## 2017-08-31 ENCOUNTER — Other Ambulatory Visit: Payer: Self-pay | Admitting: Cardiovascular Disease

## 2017-09-20 ENCOUNTER — Other Ambulatory Visit: Payer: Self-pay | Admitting: Family Medicine

## 2017-09-21 ENCOUNTER — Other Ambulatory Visit: Payer: Self-pay | Admitting: Family Medicine

## 2017-10-18 ENCOUNTER — Encounter: Payer: Self-pay | Admitting: Cardiovascular Disease

## 2017-10-18 ENCOUNTER — Encounter

## 2017-10-18 ENCOUNTER — Ambulatory Visit (INDEPENDENT_AMBULATORY_CARE_PROVIDER_SITE_OTHER): Payer: Medicare Other | Admitting: Cardiovascular Disease

## 2017-10-18 VITALS — BP 108/60 | HR 65 | Ht 65.0 in | Wt 107.4 lb

## 2017-10-18 DIAGNOSIS — I447 Left bundle-branch block, unspecified: Secondary | ICD-10-CM

## 2017-10-18 DIAGNOSIS — I25119 Atherosclerotic heart disease of native coronary artery with unspecified angina pectoris: Secondary | ICD-10-CM

## 2017-10-18 DIAGNOSIS — I340 Nonrheumatic mitral (valve) insufficiency: Secondary | ICD-10-CM | POA: Diagnosis not present

## 2017-10-18 DIAGNOSIS — I6523 Occlusion and stenosis of bilateral carotid arteries: Secondary | ICD-10-CM

## 2017-10-18 DIAGNOSIS — I350 Nonrheumatic aortic (valve) stenosis: Secondary | ICD-10-CM

## 2017-10-18 DIAGNOSIS — Z952 Presence of prosthetic heart valve: Secondary | ICD-10-CM | POA: Diagnosis not present

## 2017-10-18 DIAGNOSIS — I480 Paroxysmal atrial fibrillation: Secondary | ICD-10-CM | POA: Diagnosis not present

## 2017-10-18 NOTE — Patient Instructions (Signed)
Medication Instructions:  Your physician recommends that you continue on your current medications as directed. Please refer to the Current Medication list given to you today.   Labwork: none  Testing/Procedures: Your physician has requested that you have an echocardiogram. Echocardiography is a painless test that uses sound waves to create images of your heart. It provides your doctor with information about the size and shape of your heart and how well your heart's chambers and valves are working. This procedure takes approximately one hour. There are no restrictions for this procedure.  Scheduled for August 8,2019 at 1:00  Follow-Up: Your physician recommends that you schedule a follow-up appointment in: August with K. Grandville Silos, [place].  Scheduled for August 8,2019 at 2:00  Your physician wants you to follow-up in: 9 months with Dr. Shirley Friar will receive a reminder letter in the mail two months in advance. If you don't receive a letter, please call our office to schedule the follow-up appointment.     Any Other Special Instructions Will Be Listed Below (If Applicable).     If you need a refill on your cardiac medications before your next appointment, please call your pharmacy.

## 2017-10-18 NOTE — Progress Notes (Signed)
Chief Complaint  Patient presents with  . Follow-up    s/p TAVR    History of Present Illness: 84 yo female with history of CAD s/p CABG, atrial fib s/p MAZE procedure, CVA, carotid artery disease s/p right CEA, fibromyalgia, GERD, moderate MR, severe AS s/p TAVR who is here today for one month TAVR follow up. She underwent CABG in 2006 with MAZE procedure. She is known to have moderate carotid disease by last dopplers in June 2018. She was found to have severe AS by echo in July 2017 along with moderate MR. We discussed her aortic valve disease at that time but she did not wish to pursue further evaluation. She is known to have atrial fibrillation but refused anticoagulation due to hematuria when on Eliquis in 2015. In the spring of 2018 she began to have dyspnea and chest pain. Echo 10/14/16 with normal LV systolic function, mild mitral regurgitation and severe aortic stenosis (mean gradient 54 mm Hg peak gradient 101 mmHg). I was able to talk her into considering TAVR. Cardiac cath 10/27/16 with 3/4 patent bypass grafts. The LIMA to the Diagonal was atretic but vein grafts were open to the LAD, Circumflex and distal RCA. She was found to have a LAA thrombus on pre-operative testing and was started on Eliquis. She underwent TAVR on 01/03/17 with placement of a 23 mm Edwards Sapien 3 valve from the right femoral artery. She reported episodes of dizziness at her f/u with me in September 2018 and cardiac monitor showed sinus pauses and possible second degree AV block. She was seen in EP clinic by Dr. Lennie Odor and her Cardizem was changed to Lopressor.   She is here today for follow up. The patient denies any chest pain, dyspnea, palpitations, lower extremity edema, orthopnea, PND, dizziness, near syncope or syncope.   Primary Care Physician: Denita Lung, MD   Past Medical History:  Diagnosis Date  . Anxiety   . Arthritis    OSTEO  . Aspirin allergy    on Plavix  . Asthma   . Bronchitis   .  CAD (coronary artery disease)    a. s/p CABG 2006 with Cox-Maze procedure.  . Carotid artery disease ([place])    a. s/p R CEA.  . Chronic stable angina ([place])   . Diastolic dysfunction   . Eczema   . Fibromyalgia   . GERD (gastroesophageal reflux disease)   . Heart murmur   . Hiatal hernia   . Hyperlipidemia   . Hypertension   . Kyphoscoliosis   . Mitral regurgitation   . Myocardial infarction [facility]) 2003   Stent to CFX  . PAF (paroxysmal atrial fibrillation) ([place])    a. pt has h/o hematuria on Eliquis and has since refused anticoagulation  . Pneumonia 2015 ?  Marland Kitchen Pulmonary regurgitation   . Severe aortic stenosis   . Skin cancer of arm   . Stroke ([place])   . Tricuspid regurgitation     Past Surgical History:  Procedure Laterality Date  . ABDOMINAL HYSTERECTOMY  1976  . CAROTID ENDARTERECTOMY  2010  . CORONARY ANGIOPLASTY WITH STENT PLACEMENT    . CORONARY ARTERY BYPASS GRAFT  01/2005   LIMA-D1; SVG-LAD; SVG-OM; SVG-PDA  . EYE SURGERY    . MULTIPLE EXTRACTIONS WITH ALVEOLOPLASTY  12/28/2016   Extraction of tooth #'s 2- 5,7-10, 12,13,17-20,and 22-29 with alveoloplasty and maxillary right and left lateral exostoses reductions  . MULTIPLE EXTRACTIONS WITH ALVEOLOPLASTY N/A 12/28/2016   Procedure: Extraction of tooth #'s  2- 5,7-10, 12,13,17-20,and 22-29 with alveoloplasty and maxillary right and left lateral exostoses reductions;  Surgeon: Lenn Cal, DDS;  Location: [place];  Service: Oral Surgery;  Laterality: N/A;  . RIGHT/LEFT HEART CATH AND CORONARY/GRAFT ANGIOGRAPHY N/A 10/27/2016   Procedure: Right/Left Heart Cath and Coronary/Graft Angiography;  Surgeon: Burnell Blanks, MD;  Location: [place] [facility];  Service: Cardiovascular;  Laterality: N/A;  . TEE WITHOUT CARDIOVERSION N/A 01/03/2017   Procedure: TRANSESOPHAGEAL ECHOCARDIOGRAM (TEE);  Surgeon: Burnell Blanks, MD;  Location: [place];  Service: Open Heart Surgery;  Laterality: N/A;  . TONSILLECTOMY    .  TRANSCATHETER AORTIC VALVE REPLACEMENT, TRANSFEMORAL N/A 01/03/2017   Procedure: TRANSCATHETER AORTIC VALVE REPLACEMENT, TRANSFEMORAL;  Surgeon: Burnell Blanks, MD;  Location: [place];  Service: Open Heart Surgery;  Laterality: N/A;  . TUMOR REMOVAL      Current Outpatient Medications  Medication Sig Dispense Refill  . acetaminophen (TYLENOL) 650 MG CR tablet Take 1,300 mg by mouth 2 (two) times daily as needed for pain.     Marland Kitchen amoxicillin (AMOXIL) 500 MG tablet Take 4 tablets by mouth one hour prior to dental appointment 4 tablet 4  . calcium-vitamin D (OSCAL WITH D) 500-200 MG-UNIT per tablet Take 1 tablet by mouth daily with breakfast.    . cholecalciferol (VITAMIN D) 1000 UNITS tablet Take 1,000 Units by mouth daily.    . diazepam (VALIUM) 2 MG tablet Take 1 tablet (2 mg total) every 6 (six) hours as needed by mouth for anxiety. 30 tablet 0  . ELIQUIS 2.5 MG TABS tablet TAKE 1 TABLET BY MOUTH TWICE DAILY 60 tablet 6  . FLOVENT HFA 110 MCG/ACT inhaler INHALE 1 PUFF BY MOUTH TWICE DAILY 12 g 0  . fluticasone (FLONASE) 50 MCG/ACT nasal spray SHAKE LIQUID AND USE 2 SPRAYS IN EACH NOSTRIL EVERY DAY 48 g 0  . guaiFENesin (MUCINEX) 600 MG 12 hr tablet Take 1 tablet (600 mg total) by mouth 2 (two) times daily. 30 tablet 0  . guaiFENesin (ROBITUSSIN) 100 MG/5ML liquid Take 100 mg by mouth 3 (three) times daily as needed for cough. Reported on 12/08/2015    . irbesartan (AVAPRO) 75 MG tablet TAKE 1 TABLET BY MOUTH DAILY 90 tablet 2  . isosorbide dinitrate (ISORDIL) 30 MG tablet TAKE 1 TABLET BY MOUTH TWICE A DAY 180 tablet 2  . loratadine (CLARITIN) 10 MG tablet Take 10 mg by mouth daily.      . metoprolol tartrate (LOPRESSOR) 25 MG tablet Take 0.5 tablets (12.5 mg total) by mouth 2 (two) times daily. 30 tablet 3  . nitrofurantoin, macrocrystal-monohydrate, (MACROBID) 100 MG capsule Take 1 capsule (100 mg total) 2 (two) times daily by mouth. 20 capsule 0  . nitroGLYCERIN (NITROSTAT) 0.4 MG SL  tablet Place 1 tablet (0.4 mg total) under the tongue every 5 (five) minutes x 3 doses as needed for chest pain. MAXIMUM DAILY DOSE IS 3 TABLETS 25 tablet 1  . pantoprazole (PROTONIX) 40 MG tablet Take 1 tablet (40 mg total) by mouth 2 (two) times daily. 180 tablet 1  . rosuvastatin (CRESTOR) 40 MG tablet TAKE 1 TABLET BY MOUTH EVERY DAY 90 tablet 1  . traMADol (ULTRAM) 50 MG tablet Take 1 tablet (50 mg total) every 8 (eight) hours as needed by mouth. 50 tablet 1  . VENTOLIN HFA 108 (90 Base) MCG/ACT inhaler INHALE 2 PUFFS BY MOUTH EVERY 6 HOURS AS NEEDED FOR WHEEZING OR SHORTNESS OF BREATH 18 g 0  No current facility-administered medications for this visit.     Allergies  Allergen Reactions  . Bactrim [Sulfamethoxazole-Trimethoprim] Other (See Comments)    "makes her feel funny" or "unsteady"   . Amitriptyline Hcl Rash  . Aspirin Swelling and Rash    SWELLING REACTION UNSPECIFIED   . Zetia [Ezetimibe] Rash    Social History   Socioeconomic History  . Marital status: Widowed    Spouse name: Not on file  . Number of children: 3  . Years of education: Not on file  . Highest education level: Not on file  Occupational History    Employer: RETIRED  Social Needs  . Financial resource strain: Not on file  . Food insecurity:    Worry: Not on file    Inability: Not on file  . Transportation needs:    Medical: Not on file    Non-medical: Not on file  Tobacco Use  . Smoking status: Never Smoker  . Smokeless tobacco: Former Systems developer    Types: Snuff  Substance and Sexual Activity  . Alcohol use: No  . Drug use: No  . Sexual activity: Not Currently  Lifestyle  . Physical activity:    Days per week: Not on file    Minutes per session: Not on file  . Stress: Not on file  Relationships  . Social connections:    Talks on phone: Not on file    Gets together: Not on file    Attends religious service: Not on file    Active member of club or organization: Not on file    Attends  meetings of clubs or organizations: Not on file    Relationship status: Not on file  . Intimate partner violence:    Fear of current or ex partner: Not on file    Emotionally abused: Not on file    Physically abused: Not on file    Forced sexual activity: Not on file  Other Topics Concern  . Not on file  Social History Narrative  . Not on file    Family History  Problem Relation Age of Onset  . Heart failure Mother   . Other Father     Review of Systems:  As stated in the HPI and otherwise negative.   BP 108/60   Pulse 65   Ht 5\' 5"  (1.651 m)   Wt 107 lb 6.4 oz (48.7 kg)   SpO2 93%   BMI 17.87 kg/m   Physical Examination:  General: Well developed, well nourished, NAD  HEENT: OP clear, mucus membranes moist  SKIN: warm, dry. No rashes. Neuro: No focal deficits  Musculoskeletal: Muscle strength 5/5 all ext  Psychiatric: Mood and affect normal  Neck: No JVD, no carotid bruits, no thyromegaly, no lymphadenopathy.  Lungs:Clear bilaterally, no wheezes, rhonci, crackles Cardiovascular: Regular rate and rhythm. No murmurs, gallops or rubs. Abdomen:Soft. Bowel sounds present. Non-tender.  Extremities: No lower extremity edema. Pulses are 2 + in the bilateral DP/PT.  Echo 02/06/17: Left ventricle: The cavity size was normal. Systolic function was   normal. The estimated ejection fraction was in the range of 60%   to 65%. Wall motion was normal; there were no regional wall   motion abnormalities. The study was not technically sufficient to   allow evaluation of LV diastolic dysfunction due to atrial   fibrillation. - Aortic valve: A TAVR bioprosthetic valve is present (23 mm   Edwards-SAPIEN). There was no regurgitation. Mean gradient (S): 8   mm Hg. Peak gradient (  S): 19 mm Hg. - Aortic root: The aortic root was normal in size. - Mitral valve: Calcified annulus. Moderately thickened, moderately   calcified leaflets . There was mild regurgitation. - Left atrium: The  atrium was normal in size. - Right ventricle: Systolic function was normal. - Tricuspid valve: There was moderate regurgitation. - Pulmonic valve: There was mild regurgitation. - Pulmonary arteries: Systolic pressure was mildly increased. PA   peak pressure: 38 mm Hg (S). - Inferior vena cava: The vessel was normal in size. - Pericardium, extracardiac: There was no pericardial effusion.  Impressions:  - Ni significant change since the prior study on 01/04/2017,   transaortic gradients across the bioprosthetic valve remain   unchanged and are normal for this type of prosthesis. There is no   central aortic regurgitation or paravalvular leak.  EKG:  EKG is  ordered today. The ekg ordered today demonstrates NSR, rate 65 bpm. 1st degree AV block.   Recent Labs: 12/26/2016: ALT 68 01/04/2017: Magnesium 2.3 01/05/2017: BUN 11; Creatinine, Ser 0.46; Hemoglobin 9.7; Platelets 187; Potassium 3.3; Sodium 141   Lipid Panel    Component Value Date/Time   CHOL 144 08/21/2015 0001   TRIG 87 08/21/2015 0001   HDL 73 08/21/2015 0001   CHOLHDL 2.0 08/21/2015 0001   VLDL 17 08/21/2015 0001   LDLCALC 54 08/21/2015 0001     Wt Readings from Last 3 Encounters:  10/18/17 107 lb 6.4 oz (48.7 kg)  03/27/17 110 lb 12.8 oz (50.3 kg)  03/24/17 115 lb (52.2 kg)     Other studies Reviewed: Additional studies/ records that were reviewed today include: . Review of the above records demonstrates:   Assessment and Plan:   1. Severe aortic valve stenosis: Stable post TAVR in August 2018. She is doing well. 23 mm Edwards Sapien 3 valve placed from the right femoral artery in August 2018. She is on Eliquis. She has new LBBB post TAVR. 48 hour cardiac monitor in September 2018 showed sinus pauses and possible second degree AV block. She was seen in EP and her Cardizem was changed to Lopressor. She has had improvement in dizziness. EKG today with NSR. No inidication for pacemaker. She wishes to follow up with  Dr. Lennie Odor as planned.  She will use antibiotics prior to dental visits.    2. Carotid artery disease: Moderate bilateral disease by dopplers 2018. Will repeat carotid dopplers in 2020.   3. Mitral regurgitation: Mild by echo in Sept 2018. Will follow  4. CAD with stable angina: CAD stable by cath in June 2018. She has stable angina. Will continue beta blocker, nitrate and statin.       5. Atrial fibrillation,paroxysmal: She is in sinus today. Will continue Lopressor and Eliquis.    6. LBBB: new post TAVR. She has been seen in the EP clinic. No dizziness. No indication for a pacemaker.   7. Dizziness/bradycardia: Resolved. She has been seen in EP following a cardiac monitor that showed sinus pauses and possible second degree AV block. Cardizem stopped and Lopressor started. She is feeling better with no dizziness. Follow up with EP as needed  Current medicines are reviewed at length with the patient today.  The patient does not have concerns regarding medicines.  The following changes have been made:  no change  Labs/ tests ordered today include:   Orders Placed This Encounter  Procedures  . ECHOCARDIOGRAM COMPLETE     Disposition:   FU with the valve team in 3 months for  one year TAVR follow up. I will see her in 9 months   Signed, Lauree Chandler, MD 10/18/2017 1:21 PM    [facility] [address], [place], [place]  [address] Phone: [phone]; Fax: [phone]

## 2017-10-19 ENCOUNTER — Other Ambulatory Visit: Payer: Self-pay | Admitting: Family Medicine

## 2017-10-19 NOTE — Telephone Encounter (Signed)
[facility] is requesting to fill pt flovent. [place]

## 2017-10-19 NOTE — Addendum Note (Signed)
Addended by: Mendel Ryder on: 10/19/2017 02:42 PM   Modules accepted: Orders

## 2017-11-02 ENCOUNTER — Other Ambulatory Visit: Payer: Self-pay | Admitting: Family Medicine

## 2017-11-02 DIAGNOSIS — K219 Gastro-esophageal reflux disease without esophagitis: Secondary | ICD-10-CM

## 2017-11-03 ENCOUNTER — Telehealth: Payer: Self-pay

## 2017-11-03 ENCOUNTER — Other Ambulatory Visit: Payer: Self-pay | Admitting: Family Medicine

## 2017-11-03 DIAGNOSIS — K219 Gastro-esophageal reflux disease without esophagitis: Secondary | ICD-10-CM

## 2017-11-03 NOTE — Telephone Encounter (Signed)
Called pt to schedule an appt with doctor [employer] or med check . He rmed was sent in for 30 days. [place]

## 2017-12-11 ENCOUNTER — Other Ambulatory Visit: Payer: Self-pay | Admitting: Cardiovascular Disease

## 2017-12-18 ENCOUNTER — Other Ambulatory Visit: Payer: Self-pay | Admitting: Family Medicine

## 2017-12-18 NOTE — Telephone Encounter (Signed)
[facility] is requesting to fill pt inhaler. Please advise KH 

## 2017-12-20 ENCOUNTER — Telehealth: Payer: Self-pay | Admitting: Family Medicine

## 2017-12-20 ENCOUNTER — Encounter: Payer: Self-pay | Admitting: Family Medicine

## 2017-12-20 ENCOUNTER — Ambulatory Visit (INDEPENDENT_AMBULATORY_CARE_PROVIDER_SITE_OTHER): Payer: Medicare Other | Admitting: Family Medicine

## 2017-12-20 VITALS — BP 108/62 | HR 65 | Temp 98.3°F | Wt 109.8 lb

## 2017-12-20 DIAGNOSIS — J309 Allergic rhinitis, unspecified: Secondary | ICD-10-CM

## 2017-12-20 DIAGNOSIS — Z951 Presence of aortocoronary bypass graft: Secondary | ICD-10-CM | POA: Diagnosis not present

## 2017-12-20 DIAGNOSIS — H9113 Presbycusis, bilateral: Secondary | ICD-10-CM

## 2017-12-20 DIAGNOSIS — I208 Other forms of angina pectoris: Secondary | ICD-10-CM | POA: Diagnosis not present

## 2017-12-20 DIAGNOSIS — K219 Gastro-esophageal reflux disease without esophagitis: Secondary | ICD-10-CM

## 2017-12-20 DIAGNOSIS — I779 Disorder of arteries and arterioles, unspecified: Secondary | ICD-10-CM | POA: Diagnosis not present

## 2017-12-20 DIAGNOSIS — I6389 Other cerebral infarction: Secondary | ICD-10-CM

## 2017-12-20 DIAGNOSIS — R42 Dizziness and giddiness: Secondary | ICD-10-CM | POA: Diagnosis not present

## 2017-12-20 DIAGNOSIS — I739 Peripheral vascular disease, unspecified: Secondary | ICD-10-CM

## 2017-12-20 DIAGNOSIS — I5189 Other ill-defined heart diseases: Secondary | ICD-10-CM

## 2017-12-20 DIAGNOSIS — J45909 Unspecified asthma, uncomplicated: Secondary | ICD-10-CM

## 2017-12-20 DIAGNOSIS — M1990 Unspecified osteoarthritis, unspecified site: Secondary | ICD-10-CM

## 2017-12-20 MED ORDER — TRAMADOL HCL 50 MG PO TABS: 50.0000 mg | ORAL_TABLET | Freq: Three times a day (TID) | ORAL | 1 refills | Status: DC | PRN

## 2017-12-20 MED ORDER — DIAZEPAM 2 MG PO TABS: 2.0000 mg | ORAL_TABLET | Freq: Four times a day (QID) | ORAL | 1 refills | Status: DC | PRN

## 2017-12-20 NOTE — Telephone Encounter (Signed)
Daughter requested that lab results and office note be mailed to pt when they are available

## 2017-12-20 NOTE — Progress Notes (Signed)
   Subjective:    Patient ID: [name], female    DOB: 12/20/1928, 84 y.o.   MRN: [id number]  HPI She is here for medication management visit.  She has multiple issues going on in her life.  She does have difficulty hearing but states she cannot afford to get a hearing aid.  Her allergies seem to be under good control.  She does have arthritis and uses Tylenol and tramadol for that.  She is taking her reflux medicine on a daily basis.  Does have underlying cardiac disease and is followed by cardiology for evaluation and treatment of multiple cardiac issues..  At the present time she is having no chest pain,, shortness of breath or PND.  She does occasionally have difficulty with dizziness and responds nicely to diazepam.  Does have a previous history of CVA.  She does have underlying asthma but seems to have this under good control.  She has had recent dental work and now has dentures.   Review of Systems     Objective:   Physical Exam Alert and in no distress. Tympanic membranes and canals are normal. Pharyngeal area is normal. Neck is supple without adenopathy or thyromegaly. Cardiac exam difficult to hear well but appears to have sinus rhythm without murmurs or gallops. Lungs are clear to auscultation.        Assessment & Plan:  Uncomplicated asthma, unspecified asthma severity, unspecified whether persistent  Arthritis - Plan: traMADol (ULTRAM) 50 MG tablet  Allergic rhinitis, unspecified seasonality, unspecified trigger  Chronic stable angina ([place]) - Plan: CBC with Differential/Platelet, Comprehensive metabolic panel, Lipid panel  Diastolic dysfunction - Plan: CBC with Differential/Platelet, Comprehensive metabolic panel, Lipid panel  Gastroesophageal reflux disease without esophagitis  Carotid artery disease, unspecified laterality, unspecified type ([place]) - Plan: CBC with Differential/Platelet, Comprehensive metabolic panel, Lipid panel  Hx of CABG 2006 with Maze - Plan:  CBC with Differential/Platelet, Comprehensive metabolic panel, Lipid panel  Vertigo - Plan: diazepam (VALIUM) 2 MG tablet  Cerebrovascular accident (CVA) due to other mechanism ([facility])  Presbycusis of both ears

## 2017-12-20 NOTE — Addendum Note (Signed)
Addended by: Denita Lung on: 12/20/2017 03:48 PM   Modules accepted: Orders

## 2017-12-21 LAB — COMPREHENSIVE METABOLIC PANEL
A/G RATIO: 1.8 (ref 1.2–2.2)
ALBUMIN: 4.1 g/dL (ref 3.5–4.7)
ALK PHOS: 42 IU/L (ref 39–117)
ALT: 15 IU/L (ref 0–32)
AST: 17 IU/L (ref 0–40)
BILIRUBIN TOTAL: 0.5 mg/dL (ref 0.0–1.2)
BUN / CREAT RATIO: 22 (ref 12–28)
BUN: 10 mg/dL (ref 8–27)
CHLORIDE: 102 mmol/L (ref 96–106)
CO2: 25 mmol/L (ref 20–29)
Calcium: 9.6 mg/dL (ref 8.7–10.3)
Creatinine, Ser: 0.45 mg/dL — ABNORMAL LOW (ref 0.57–1.00)
GFR calc non Af Amer: 91 mL/min/{1.73_m2} (ref >59)
GFR, EST AFRICAN AMERICAN: 105 mL/min/{1.73_m2} (ref >59)
GLUCOSE: 99 mg/dL (ref 65–99)
Globulin, Total: 2.3 g/dL (ref 1.5–4.5)
POTASSIUM: 3.9 mmol/L (ref 3.5–5.2)
SODIUM: 143 mmol/L (ref 134–144)
TOTAL PROTEIN: 6.4 g/dL (ref 6.0–8.5)

## 2017-12-21 LAB — CBC WITH DIFFERENTIAL/PLATELET
BASOS ABS: 0 10*3/uL (ref 0.0–0.2)
Basos: 1 %
EOS (ABSOLUTE): 0.1 10*3/uL (ref 0.0–0.4)
Eos: 2 %
HEMOGLOBIN: 11.2 g/dL (ref 11.1–15.9)
Hematocrit: 35.1 % (ref 34.0–46.6)
IMMATURE GRANS (ABS): 0 10*3/uL (ref 0.0–0.1)
Immature Granulocytes: 0 %
LYMPHS: 26 %
Lymphocytes Absolute: 1.4 10*3/uL (ref 0.7–3.1)
MCH: 30 pg (ref 26.6–33.0)
MCHC: 31.9 g/dL (ref 31.5–35.7)
MCV: 94 fL (ref 79–97)
MONOCYTES: 6 %
Monocytes Absolute: 0.4 10*3/uL (ref 0.1–0.9)
NEUTROS PCT: 65 %
Neutrophils Absolute: 3.7 10*3/uL (ref 1.4–7.0)
Platelets: 182 10*3/uL (ref 150–450)
RBC: 3.73 x10E6/uL — AB (ref 3.77–5.28)
RDW: 12.7 % (ref 12.3–15.4)
WBC: 5.7 10*3/uL (ref 3.4–10.8)

## 2017-12-21 LAB — LIPID PANEL
Chol/HDL Ratio: 1.9 ratio (ref 0.0–4.4)
Cholesterol, Total: 148 mg/dL (ref 100–199)
HDL: 77 mg/dL (ref >39)
LDL Calculated: 57 mg/dL (ref 0–99)
Triglycerides: 70 mg/dL (ref 0–149)
VLDL CHOLESTEROL CAL: 14 mg/dL (ref 5–40)

## 2017-12-28 ENCOUNTER — Encounter: Payer: Self-pay | Admitting: Physician Assistant

## 2017-12-28 ENCOUNTER — Ambulatory Visit (INDEPENDENT_AMBULATORY_CARE_PROVIDER_SITE_OTHER): Payer: Medicare Other | Admitting: Physician Assistant

## 2017-12-28 ENCOUNTER — Other Ambulatory Visit: Payer: Self-pay

## 2017-12-28 ENCOUNTER — Ambulatory Visit (HOSPITAL_COMMUNITY): Payer: Medicare Other | Attending: Cardiovascular Disease

## 2017-12-28 VITALS — BP 130/60 | HR 74 | Ht 65.0 in | Wt 110.1 lb

## 2017-12-28 DIAGNOSIS — Z952 Presence of prosthetic heart valve: Secondary | ICD-10-CM | POA: Diagnosis not present

## 2017-12-28 DIAGNOSIS — I081 Rheumatic disorders of both mitral and tricuspid valves: Secondary | ICD-10-CM | POA: Insufficient documentation

## 2017-12-28 DIAGNOSIS — I447 Left bundle-branch block, unspecified: Secondary | ICD-10-CM | POA: Insufficient documentation

## 2017-12-28 DIAGNOSIS — I25708 Atherosclerosis of coronary artery bypass graft(s), unspecified, with other forms of angina pectoris: Secondary | ICD-10-CM

## 2017-12-28 DIAGNOSIS — I350 Nonrheumatic aortic (valve) stenosis: Secondary | ICD-10-CM

## 2017-12-28 DIAGNOSIS — I2510 Atherosclerotic heart disease of native coronary artery without angina pectoris: Secondary | ICD-10-CM | POA: Diagnosis not present

## 2017-12-28 DIAGNOSIS — Z953 Presence of xenogenic heart valve: Secondary | ICD-10-CM | POA: Insufficient documentation

## 2017-12-28 DIAGNOSIS — I480 Paroxysmal atrial fibrillation: Secondary | ICD-10-CM | POA: Diagnosis not present

## 2017-12-28 DIAGNOSIS — I6523 Occlusion and stenosis of bilateral carotid arteries: Secondary | ICD-10-CM | POA: Diagnosis not present

## 2017-12-28 DIAGNOSIS — R42 Dizziness and giddiness: Secondary | ICD-10-CM

## 2017-12-28 DIAGNOSIS — I4891 Unspecified atrial fibrillation: Secondary | ICD-10-CM | POA: Insufficient documentation

## 2017-12-28 NOTE — Progress Notes (Signed)
[place]                                       Cardiology Office Note    Date:  12/29/2017   ID:  [name], DOB [birth year], MRN [id number]  PCP:  Denita Lung, MD  Cardiologist: Dr. Angelena Form / Dr. Curt Bears (EP)  CC: 1 year s/p TAVR  History of Present Illness:  [name] is a 84 y.o. female with a history of CAD s/p CABG, atrial fib s/p MAZE procedure, CVA, carotid artery disease s/p right CEA, fibromyalgia, GERD, moderate MR, severe AS s/p TAVR (12/2016) who presents to clinic for follow up.   She underwent CABG in 2006 with MAZE procedure. She is known to have moderate carotid disease by last dopplers in June 2018. She was found to have severe AS by echo in July 2017 along with moderate MR. We discussed her aortic valve disease at that time but she did not wish to pursue further evaluation. She is known to have atrial fibrillation but refused anticoagulation due to hematuria when on Eliquis in 2015. In the spring of 2018 she began to have dyspnea and chest pain. Echo 10/14/16 with normal LV systolic function, mild mitral regurgitation and severe aortic stenosis (mean gradient 54 mm Hg peak gradient 101 mmHg). She was agreeable to start TAVR work up. Cardiac cath 10/27/16 with 3/4 patent bypass grafts. The LIMA to the Diagonal was atretic but vein grafts were open to the LAD, Circumflex and distal RCA. She was found to have a LAA thrombus on pre-operative testing and was started on Eliquis. She underwent TAVR on 01/03/17 with placement of a 23 mm Edwards Sapien 3 valve from the right femoral artery. She reported episodes of dizziness at her f/u in September 2018 and cardiac monitor showed sinus pauses and possible second degree AV block. She was seen in EP clinic by Dr. Lennie Odor and her Cardizem was changed to Lopressor. Dizziness episodes since resolved.   Today she presents to clinic for follow up. She can tell a big difference in  her breathing since her surgery. She denies chest pain. She walks with a walker but sometimes she does get some shortness of breath when walking around the house or washing dishes. She is not very active at baseline. No LE edema. She chronic 2 pillow orthopnea but no PND. No blood in stool or urine. No dizziness or syncope.     Past Medical History:  Diagnosis Date  . Anxiety   . Arthritis    OSTEO  . Aspirin allergy    on Plavix  . Asthma   . Bronchitis   . CAD (coronary artery disease)    a. s/p CABG 2006 with Cox-Maze procedure.  . Carotid artery disease ([place])    a. s/p R CEA.  . Chronic stable angina ([place])   . Diastolic dysfunction   . Eczema   . Fibromyalgia   . GERD (gastroesophageal reflux disease)   . Heart murmur   . Hiatal hernia   . Hyperlipidemia   . Hypertension   . Kyphoscoliosis   . Mitral regurgitation   . Myocardial infarction [facility]) 2003   Stent to CFX  . PAF (paroxysmal atrial fibrillation) ([place])    a. pt has h/o hematuria on Eliquis and has since refused anticoagulation  . Pneumonia 2015 ?  [facility]  Pulmonary regurgitation   . Severe aortic stenosis   . Skin cancer of arm   . Stroke ([place])   . Tricuspid regurgitation     Past Surgical History:  Procedure Laterality Date  . ABDOMINAL HYSTERECTOMY  1976  . CAROTID ENDARTERECTOMY  2010  . CORONARY ANGIOPLASTY WITH STENT PLACEMENT    . CORONARY ARTERY BYPASS GRAFT  01/2005   LIMA-D1; SVG-LAD; SVG-OM; SVG-PDA  . EYE SURGERY    . MULTIPLE EXTRACTIONS WITH ALVEOLOPLASTY  12/28/2016   Extraction of tooth #'s 2- 5,7-10, 12,13,17-20,and 22-29 with alveoloplasty and maxillary right and left lateral exostoses reductions  . MULTIPLE EXTRACTIONS WITH ALVEOLOPLASTY N/A 12/28/2016   Procedure: Extraction of tooth #'s 2- 5,7-10, 12,13,17-20,and 22-29 with alveoloplasty and maxillary right and left lateral exostoses reductions;  Surgeon: Lenn Cal, DDS;  Location: [place];  Service: Oral Surgery;  Laterality: N/A;  .  RIGHT/LEFT HEART CATH AND CORONARY/GRAFT ANGIOGRAPHY N/A 10/27/2016   Procedure: Right/Left Heart Cath and Coronary/Graft Angiography;  Surgeon: Burnell Blanks, MD;  Location: [place] CV LAB;  Service: Cardiovascular;  Laterality: N/A;  . TEE WITHOUT CARDIOVERSION N/A 01/03/2017   Procedure: TRANSESOPHAGEAL ECHOCARDIOGRAM (TEE);  Surgeon: Burnell Blanks, MD;  Location: [place];  Service: Open Heart Surgery;  Laterality: N/A;  . TONSILLECTOMY    . TRANSCATHETER AORTIC VALVE REPLACEMENT, TRANSFEMORAL N/A 01/03/2017   Procedure: TRANSCATHETER AORTIC VALVE REPLACEMENT, TRANSFEMORAL;  Surgeon: Burnell Blanks, MD;  Location: [place];  Service: Open Heart Surgery;  Laterality: N/A;  . TUMOR REMOVAL      Current Medications: Outpatient Medications Prior to Visit  Medication Sig Dispense Refill  . acetaminophen (TYLENOL) 650 MG CR tablet Take 1,300 mg by mouth 2 (two) times daily as needed for pain.     . calcium-vitamin D (OSCAL WITH D) 500-200 MG-UNIT per tablet Take 1 tablet by mouth daily with breakfast.    . cholecalciferol (VITAMIN D) 1000 UNITS tablet Take 1,000 Units by mouth daily.    . diazepam (VALIUM) 2 MG tablet Take 1 tablet (2 mg total) by mouth every 6 (six) hours as needed for anxiety. 30 tablet 1  . ELIQUIS 2.5 MG TABS tablet TAKE 1 TABLET BY MOUTH TWICE DAILY 60 tablet 0  . FLOVENT HFA 110 MCG/ACT inhaler INHALE 1 PUFF BY MOUTH TWICE DAILY 12 g 0  . fluticasone (FLONASE) 50 MCG/ACT nasal spray SHAKE LIQUID AND USE 2 SPRAYS IN EACH NOSTRIL EVERY DAY 48 g 0  . guaiFENesin (MUCINEX) 600 MG 12 hr tablet Take 1 tablet (600 mg total) by mouth 2 (two) times daily. 30 tablet 0  . guaiFENesin (ROBITUSSIN) 100 MG/5ML liquid Take 100 mg by mouth 3 (three) times daily as needed for cough. Reported on 12/08/2015    . irbesartan (AVAPRO) 75 MG tablet TAKE 1 TABLET BY MOUTH DAILY 90 tablet 2  . isosorbide dinitrate (ISORDIL) 30 MG tablet TAKE 1 TABLET BY MOUTH TWICE A DAY 180  tablet 2  . loratadine (CLARITIN) 10 MG tablet Take 10 mg by mouth daily.      . metoprolol tartrate (LOPRESSOR) 25 MG tablet Take 0.5 tablets (12.5 mg total) by mouth 2 (two) times daily. 30 tablet 3  . nitroGLYCERIN (NITROSTAT) 0.4 MG SL tablet Place 1 tablet (0.4 mg total) under the tongue every 5 (five) minutes x 3 doses as needed for chest pain. MAXIMUM DAILY DOSE IS 3 TABLETS 25 tablet 1  . pantoprazole (PROTONIX) 40 MG tablet TAKE 1 TABLET(40 MG) BY MOUTH  TWICE DAILY 180 tablet 0  . rosuvastatin (CRESTOR) 40 MG tablet TAKE 1 TABLET BY MOUTH EVERY DAY 90 tablet 1  . traMADol (ULTRAM) 50 MG tablet Take 1 tablet (50 mg total) by mouth every 8 (eight) hours as needed. 50 tablet 1  . VENTOLIN HFA 108 (90 Base) MCG/ACT inhaler INHALE 2 PUFFS BY MOUTH EVERY 6 HOURS AS NEEDED FOR WHEEZING OR SHORTNESS OF BREATH 18 g 0  . amoxicillin (AMOXIL) 500 MG tablet Take 4 tablets by mouth one hour prior to dental appointment (Patient not taking: Reported on 12/28/2017) 4 tablet 4  . nitrofurantoin, macrocrystal-monohydrate, (MACROBID) 100 MG capsule Take 1 capsule (100 mg total) 2 (two) times daily by mouth. (Patient not taking: Reported on 12/28/2017) 20 capsule 0   No facility-administered medications prior to visit.      Allergies:   Bactrim [sulfamethoxazole-trimethoprim]; Amitriptyline hcl; Aspirin; and Zetia [ezetimibe]   Social History   Socioeconomic History  . Marital status: Widowed    Spouse name: Not on file  . Number of children: 3  . Years of education: Not on file  . Highest education level: Not on file  Occupational History    Employer: RETIRED  Social Needs  . Financial resource strain: Not on file  . Food insecurity:    Worry: Not on file    Inability: Not on file  . Transportation needs:    Medical: Not on file    Non-medical: Not on file  Tobacco Use  . Smoking status: Never Smoker  . Smokeless tobacco: Former Systems developer    Types: Snuff  Substance and Sexual Activity  . Alcohol  use: No  . Drug use: No  . Sexual activity: Not Currently  Lifestyle  . Physical activity:    Days per week: Not on file    Minutes per session: Not on file  . Stress: Not on file  Relationships  . Social connections:    Talks on phone: Not on file    Gets together: Not on file    Attends religious service: Not on file    Active member of club or organization: Not on file    Attends meetings of clubs or organizations: Not on file    Relationship status: Not on file  Other Topics Concern  . Not on file  Social History Narrative  . Not on file     Family History:  The patient's family history includes Heart failure in her mother; Other in her father.      ROS:   Please see the history of present illness.    ROS All other systems reviewed and are negative.   PHYSICAL EXAM:   VS:  BP 130/60   Pulse 74   Ht 5\' 5"  (1.651 m)   Wt 110 lb 1.9 oz (50 kg)   SpO2 96%   BMI 18.32 kg/m    GEN: Well nourished, well developed, in no acute distress, in a wheelchair HEENT: normal  Neck: no JVD, carotid bruits, or masses Cardiac: RRR; no murmurs, rubs, or gallops,no edema  Respiratory:  clear to auscultation bilaterally, normal work of breathing GI: soft, nontender, nondistended, + BS MS: no deformity or atrophy  Skin: warm and dry, no rash Neuro:  Alert and Oriented x 3, Strength and sensation are intact Psych: euthymic mood, full affect    Wt Readings from Last 3 Encounters:  12/28/17 110 lb 1.9 oz (50 kg)  12/20/17 109 lb 12.8 oz (49.8 kg)  10/18/17 107 lb  6.4 oz (48.7 kg)      Studies/Labs Reviewed:   EKG:  EKG is NOT  ordered today.    Recent Labs: 01/04/2017: Magnesium 2.3 12/20/2017: ALT 15; BUN 10; Creatinine, Ser 0.45; Hemoglobin 11.2; Platelets 182; Potassium 3.9; Sodium 143   Lipid Panel    Component Value Date/Time   CHOL 148 12/20/2017 1212   TRIG 70 12/20/2017 1212   HDL 77 12/20/2017 1212   CHOLHDL 1.9 12/20/2017 1212   CHOLHDL 2.0 08/21/2015 0001    VLDL 17 08/21/2015 0001   LDLCALC 57 12/20/2017 1212    Additional studies/ records that were reviewed today include:   TAVR OPERATIVE NOTE   Date of Procedure:                01/03/2017 Procedure:        Transcatheter Aortic Valve Replacement - Percutaneous right Transfemoral Approach             Edwards Sapien 3 THV (size 23 mm, model # 9600TFX, serial # [id number])              Co-Surgeons:            Gaye Pollack, MD and Lauree Chandler, MD  Pre-operative Echo Findings: ? Severe aortic stenosis ? Normal left ventricular systolic function  Post-operative Echo Findings: ? no paravalvular leak ? normal left ventricular systolic function  _________________  2D ECHO 02/06/17 ( 1 month s/p TAVR) Study Conclusions - Left ventricle: The cavity size was normal. Systolic function was   normal. The estimated ejection fraction was in the range of 60%   to 65%. Wall motion was normal; there were no regional wall   motion abnormalities. The study was not technically sufficient to   allow evaluation of LV diastolic dysfunction due to atrial   fibrillation. - Aortic valve: A TAVR bioprosthetic valve is present (23 mm   Edwards-SAPIEN). There was no regurgitation. Mean gradient (S): 8   mm Hg. Peak gradient (S): 19 mm Hg. - Aortic root: The aortic root was normal in size. - Mitral valve: Calcified annulus. Moderately thickened, moderately   calcified leaflets . There was mild regurgitation. - Left atrium: The atrium was normal in size. - Right ventricle: Systolic function was normal. - Tricuspid valve: There was moderate regurgitation. - Pulmonic valve: There was mild regurgitation. - Pulmonary arteries: Systolic pressure was mildly increased. PA   peak pressure: 38 mm Hg (S). - Inferior vena cava: The vessel was normal in size. - Pericardium, extracardiac: There was no pericardial effusion. Impressions: - Ni significant change since the prior study on 01/04/2017,    transaortic gradients across the bioprosthetic valve remain   unchanged and are normal for this type of prosthesis. There is no   central aortic regurgitation or paravalvular leak.  _________________  2D ECHO 12/28/17 ( 1 year s/p TAVR) Study Conclusions - Left ventricle: The cavity size was normal. Systolic function was   normal. The estimated ejection fraction was in the range of 60%   to 65%. Wall motion was normal; there were no regional wall   motion abnormalities. Features are consistent with a pseudonormal   left ventricular filling pattern, with concomitant abnormal   relaxation and increased filling pressure (grade 2 diastolic   dysfunction). Doppler parameters are consistent with high   ventricular filling pressure. - Aortic valve: A 83mm Edwards Sapien 3 TAVR bioprosthesis was   present. Transvalvular velocity was within the normal range.   There was no stenosis.  There was no regurgitation. Valve area   (VTI): 1.18 cm^2. Valve area (Vmax): 1.13 cm^2. Valve area   (Vmean): 1.2 cm^2. - Mitral valve: Calcified annulus. Moderately thickened, moderately   calcified leaflets . Transvalvular velocity was within the normal   range. There was no evidence for stenosis. There was mild   regurgitation. Valve area by continuity equation (using LVOT   flow): 1.53 cm^2. - Right ventricle: The cavity size was normal. Wall thickness was   normal. Systolic function was normal. - Tricuspid valve: There was mild regurgitation. - Pulmonary arteries: Systolic pressure was within the normal   range. PA peak pressure: 38 mm Hg (S).   ASSESSMENT & PLAN:   Severe AS s/p TAVR: 2D ECHO today shows EF 60% with normally functioning TAVR valve with mean gradient 81mm Hg and no PVL. She has NHYA class II symptoms but is very sedentary at baseline. SBE prophylaxis discussed; she has full dentures and doesn't see the dentist. Continue on Eliquis only for PAF.   CAD s/p CABGx4V: stable. Continue medical  therapy.   PAF: sounds regular on exam. Continue Eliquis 2.5mg  BID.   Dizziness/bradycardia: these episodes have resolved. She can follow with Dr. Curt Bears as needed.    Medication Adjustments/Labs and Tests Ordered: Current medicines are reviewed at length with the patient today.  Concerns regarding medicines are outlined above.  Medication changes, Labs and Tests ordered today are listed in the Patient Instructions below. Patient Instructions  Medication Instructions:  Your physician recommends that you continue on your current medications as directed. Please refer to the Current Medication list given to you today.   Labwork: none  Testing/Procedures: none  Follow-Up: Your physician recommends that you schedule a follow-up appointment in: March 2020. Please call our office in December to schedule this appointment    Any Other Special Instructions Will Be Listed Below (If Applicable).     If you need a refill on your cardiac medications before your next appointment, please call your pharmacy.      Signed, Angelena Form, PA-C  12/29/2017 9:39 AM    [facility] [address], [place], [place]  [address] Phone: [phone]; Fax: [phone]

## 2017-12-28 NOTE — Patient Instructions (Addendum)
Medication Instructions:  Your physician recommends that you continue on your current medications as directed. Please refer to the Current Medication list given to you today.   Labwork: none  Testing/Procedures: none  Follow-Up: Your physician recommends that you schedule a follow-up appointment in: March 2020. Please call our office in December to schedule this appointment    Any Other Special Instructions Will Be Listed Below (If Applicable).     If you need a refill on your cardiac medications before your next appointment, please call your pharmacy.

## 2018-01-04 ENCOUNTER — Encounter: Payer: Self-pay | Admitting: Thoracic Surgery (Cardiothoracic Vascular Surgery)

## 2018-01-06 ENCOUNTER — Other Ambulatory Visit: Payer: Self-pay | Admitting: Cardiovascular Disease

## 2018-01-15 ENCOUNTER — Telehealth: Payer: Self-pay | Admitting: Family Medicine

## 2018-01-15 ENCOUNTER — Other Ambulatory Visit: Payer: Self-pay | Admitting: Family Medicine

## 2018-01-17 ENCOUNTER — Other Ambulatory Visit: Payer: Self-pay | Admitting: Family Medicine

## 2018-01-18 ENCOUNTER — Other Ambulatory Visit: Payer: Self-pay | Admitting: Family Medicine

## 2018-01-23 ENCOUNTER — Other Ambulatory Visit: Payer: Self-pay | Admitting: Medical

## 2018-01-23 MED ORDER — ALBUTEROL SULFATE HFA 108 (90 BASE) MCG/ACT IN AERS: INHALATION_SPRAY | RESPIRATORY_TRACT | 2 refills | Status: DC

## 2018-01-23 NOTE — Telephone Encounter (Signed)
Is this ok to refill?  

## 2018-01-23 NOTE — Telephone Encounter (Signed)
yours

## 2018-01-23 NOTE — Addendum Note (Signed)
Addended by: Denita Lung on: 01/23/2018 04:44 PM   Modules accepted: Orders

## 2018-01-27 NOTE — Telephone Encounter (Signed)
P.A. Approved til 01/15/19, pt informed

## 2018-02-01 ENCOUNTER — Other Ambulatory Visit: Payer: Self-pay | Admitting: Cardiovascular Disease

## 2018-02-21 ENCOUNTER — Other Ambulatory Visit: Payer: Self-pay | Admitting: Family Medicine

## 2018-02-22 NOTE — Telephone Encounter (Signed)
[facility] is requesting to fill pt Flovent. Please advise [facility]

## 2018-03-07 ENCOUNTER — Other Ambulatory Visit: Payer: Self-pay | Admitting: Family Medicine

## 2018-03-07 DIAGNOSIS — M25511 Pain in right shoulder: Secondary | ICD-10-CM | POA: Diagnosis not present

## 2018-03-07 DIAGNOSIS — K219 Gastro-esophageal reflux disease without esophagitis: Secondary | ICD-10-CM

## 2018-03-07 DIAGNOSIS — M25512 Pain in left shoulder: Secondary | ICD-10-CM | POA: Diagnosis not present

## 2018-03-07 DIAGNOSIS — M79641 Pain in right hand: Secondary | ICD-10-CM | POA: Diagnosis not present

## 2018-03-07 DIAGNOSIS — M79642 Pain in left hand: Secondary | ICD-10-CM | POA: Diagnosis not present

## 2018-03-16 ENCOUNTER — Other Ambulatory Visit: Payer: Self-pay | Admitting: Cardiovascular Disease

## 2018-03-16 DIAGNOSIS — Z23 Encounter for immunization: Secondary | ICD-10-CM | POA: Diagnosis not present

## 2018-03-25 ENCOUNTER — Other Ambulatory Visit: Payer: Self-pay | Admitting: Family Medicine

## 2018-04-08 ENCOUNTER — Other Ambulatory Visit: Payer: Self-pay | Admitting: Cardiovascular Disease

## 2018-04-08 ENCOUNTER — Other Ambulatory Visit: Payer: Self-pay | Admitting: Family Medicine

## 2018-04-12 ENCOUNTER — Telehealth: Payer: Self-pay | Admitting: Family Medicine

## 2018-04-12 MED ORDER — CIPROFLOXACIN HCL 500 MG PO TABS: 500.0000 mg | ORAL_TABLET | Freq: Two times a day (BID) | ORAL | 0 refills | Status: DC

## 2018-04-12 NOTE — Telephone Encounter (Signed)
Pt called and and states she thinks she has a kidney/urinary infection/ she is having burning itching, and her urine is very yellow, and she is not going as often as she would like, pt does not have anyone to bring her her daughter does not get off until 5, she uses [facility] [address] pt can be reached at [phone]

## 2018-04-12 NOTE — Telephone Encounter (Signed)
Patient advised.

## 2018-04-12 NOTE — Telephone Encounter (Signed)
Tell her I will treat her with a short course of an antibiotic.  Have her increase her fluids and also have her use Azo-Standard.  If there is still trouble next week have her call.

## 2018-04-13 ENCOUNTER — Other Ambulatory Visit: Payer: Self-pay | Admitting: Family Medicine

## 2018-04-23 ENCOUNTER — Telehealth: Payer: Self-pay | Admitting: Family Medicine

## 2018-04-23 MED ORDER — NITROFURANTOIN MONOHYD MACRO 100 MG PO CAPS: 100.0000 mg | ORAL_CAPSULE | Freq: Two times a day (BID) | ORAL | 0 refills | Status: DC

## 2018-04-23 NOTE — Telephone Encounter (Signed)
Pt aware [facility] 

## 2018-04-23 NOTE — Telephone Encounter (Signed)
Pt called and states urinary issues no better after the 6 pills, says last year you gave her #20 Nitrofurantoin 100 mg & they worked well for her.  Says daughter cant bring because she can't get off work at this time due to they are short handed and wont allow her to leave work.  She has no one else that can bring her.  Wants to know if you will call her in the Nitrofurantoin

## 2018-04-23 NOTE — Telephone Encounter (Signed)
Let her know that I called the medication in. 

## 2018-04-25 DIAGNOSIS — G5602 Carpal tunnel syndrome, left upper limb: Secondary | ICD-10-CM | POA: Diagnosis not present

## 2018-04-25 DIAGNOSIS — M19012 Primary osteoarthritis, left shoulder: Secondary | ICD-10-CM | POA: Diagnosis not present

## 2018-05-21 ENCOUNTER — Other Ambulatory Visit: Payer: Self-pay | Admitting: Family Medicine

## 2018-06-04 ENCOUNTER — Other Ambulatory Visit: Payer: Self-pay | Admitting: Family Medicine

## 2018-06-04 DIAGNOSIS — K219 Gastro-esophageal reflux disease without esophagitis: Secondary | ICD-10-CM

## 2018-06-11 IMAGING — CR DG CHEST 2V
2 series · 2 of 2 positions shown · non-contrast
Comparison: 11/30/2015

CLINICAL DATA: Short of breath and weakness

EXAM:
CHEST  2 VIEW

[w chest pa]
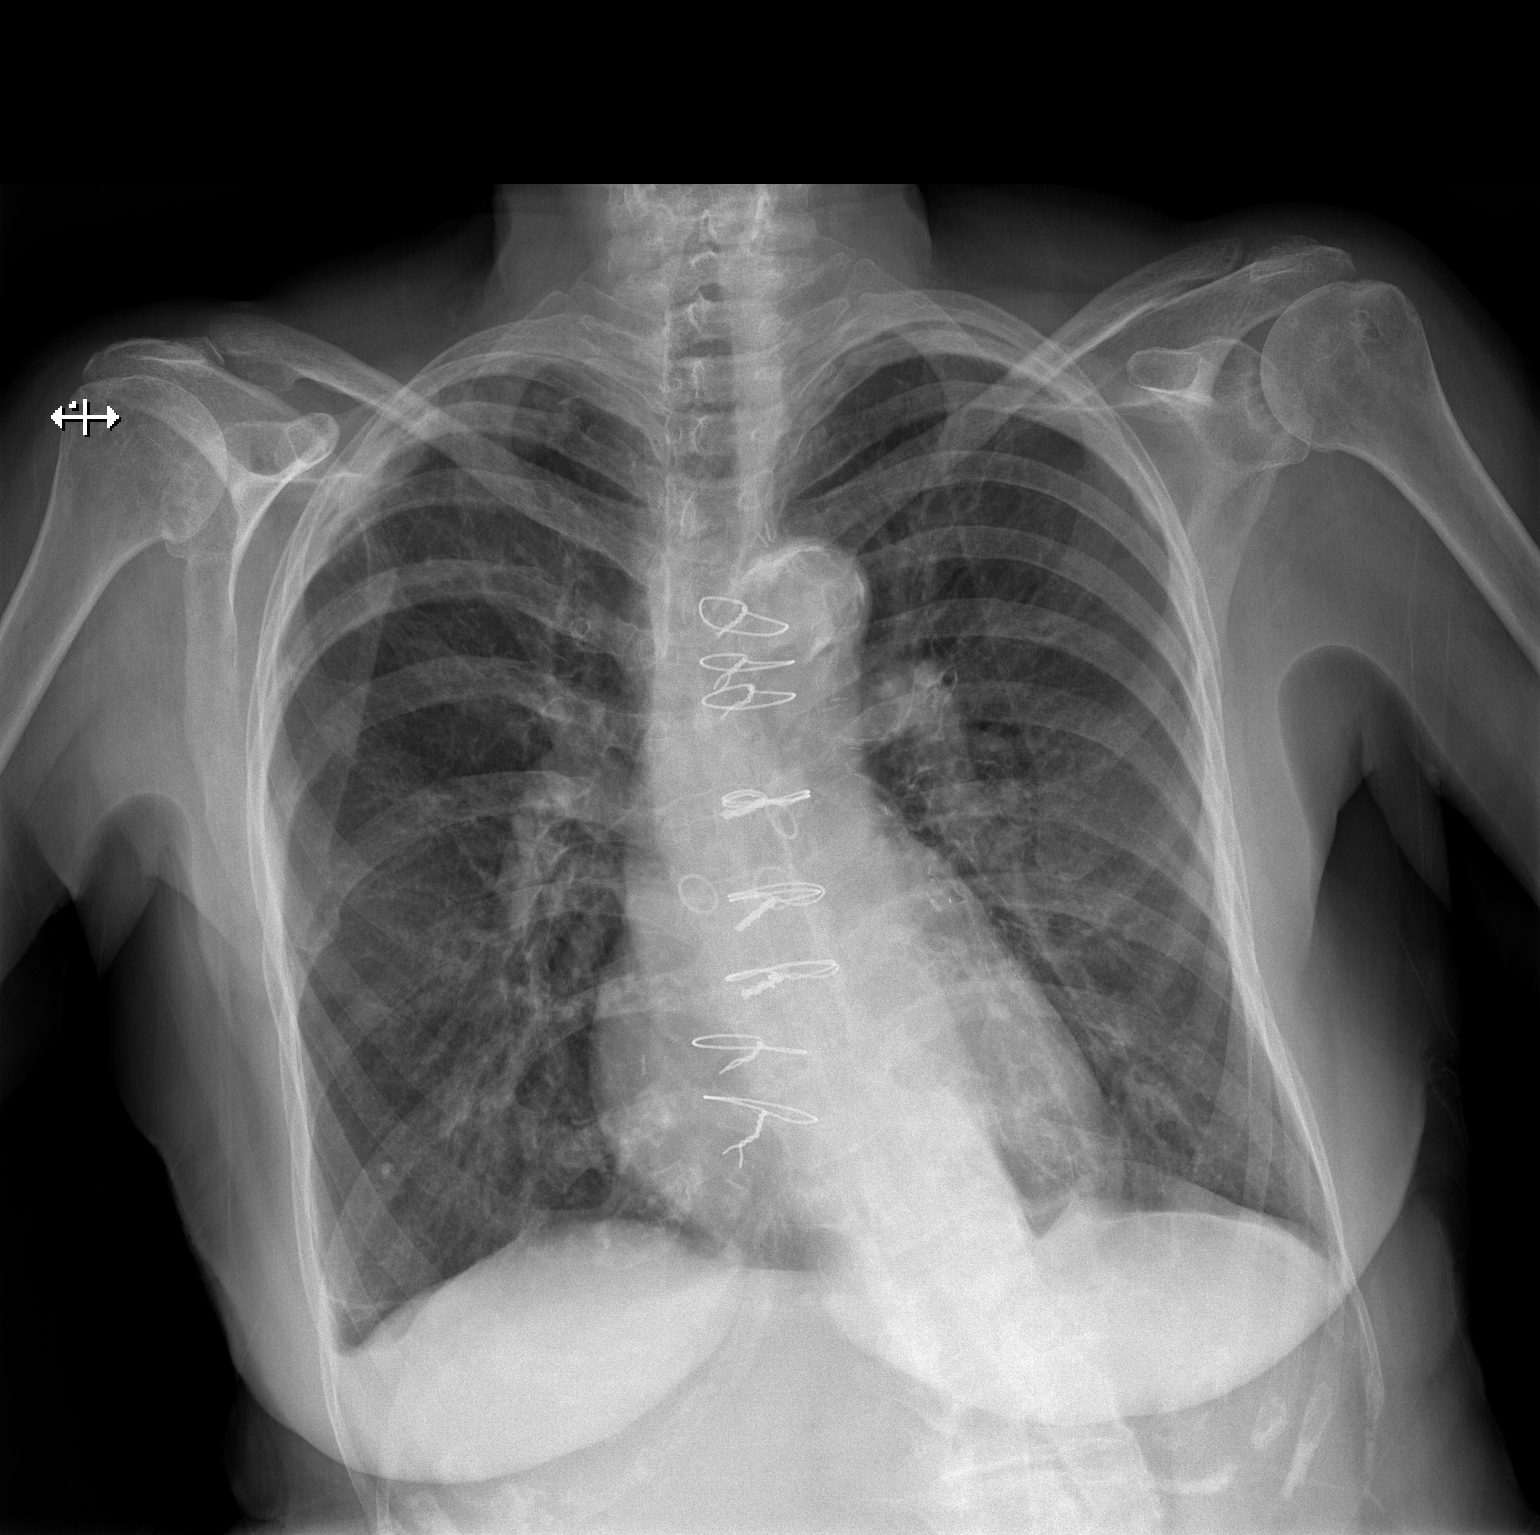

[w chest lat]
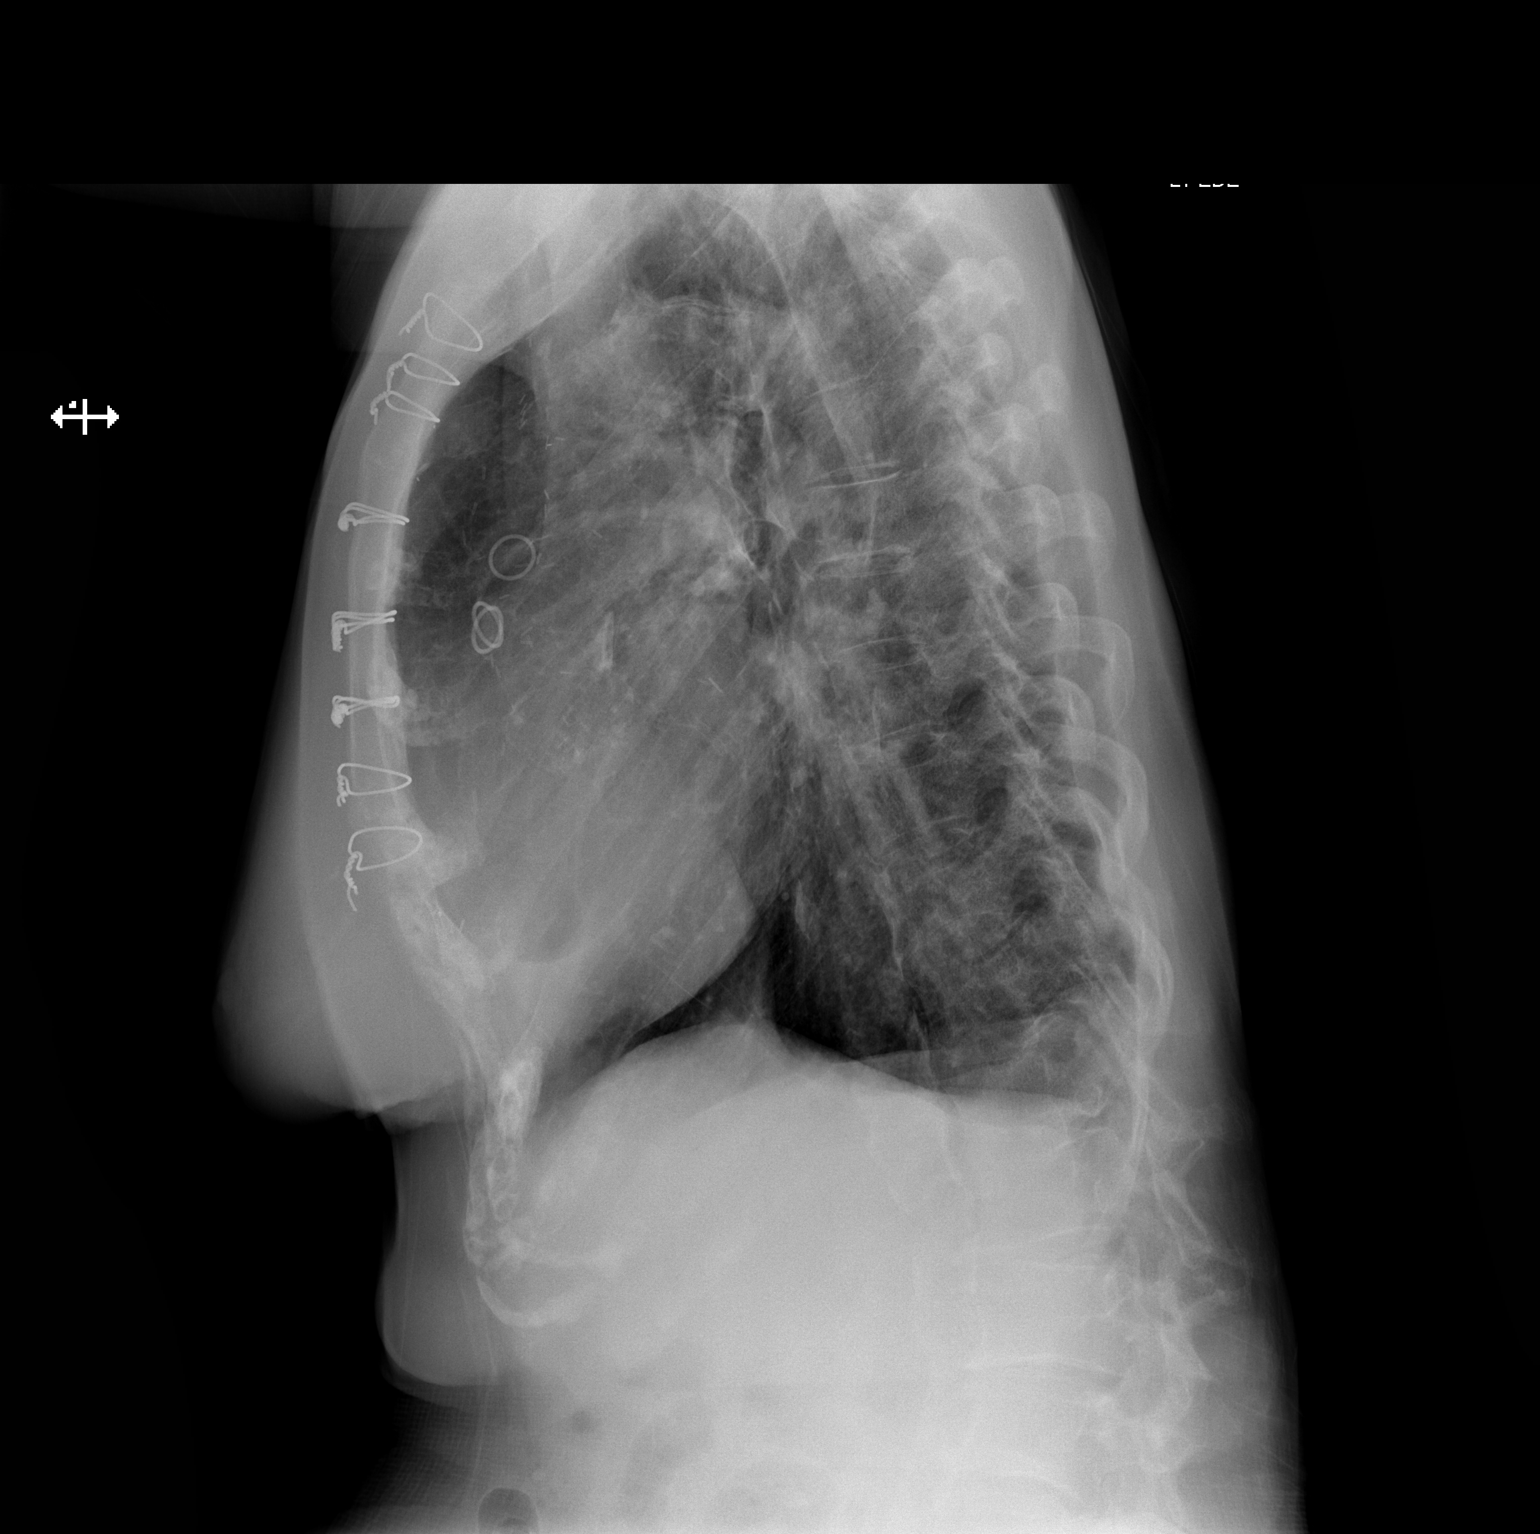

[2 of 2 positions shown; findings below may reference images not displayed]

FINDINGS: The heart is normal in size. Postop changes from sternotomy and
CABG. Lungs are hyperaerated. Calcified granuloma at the right lung
base. Mild patchy densities at the left base are worrisome for
airspace disease. No pneumothorax. No pleural effusion.
Dextroscoliosis in the midthoracic spine.
IMPRESSION: Patchy root left basilar airspace disease.

## 2018-06-19 ENCOUNTER — Other Ambulatory Visit: Payer: Self-pay | Admitting: Family Medicine

## 2018-06-19 IMAGING — DX DG CHEST 1V PORT
1 series · 1 of 1 positions shown · non-contrast
Comparison: PA and lateral chest x-ray December 28, 2016

CLINICAL DATA: Status post transcatheter aortic valve replacement.
History of asthma, coronary artery disease and previous MI

EXAM:
PORTABLE CHEST 1 VIEW

[chest]
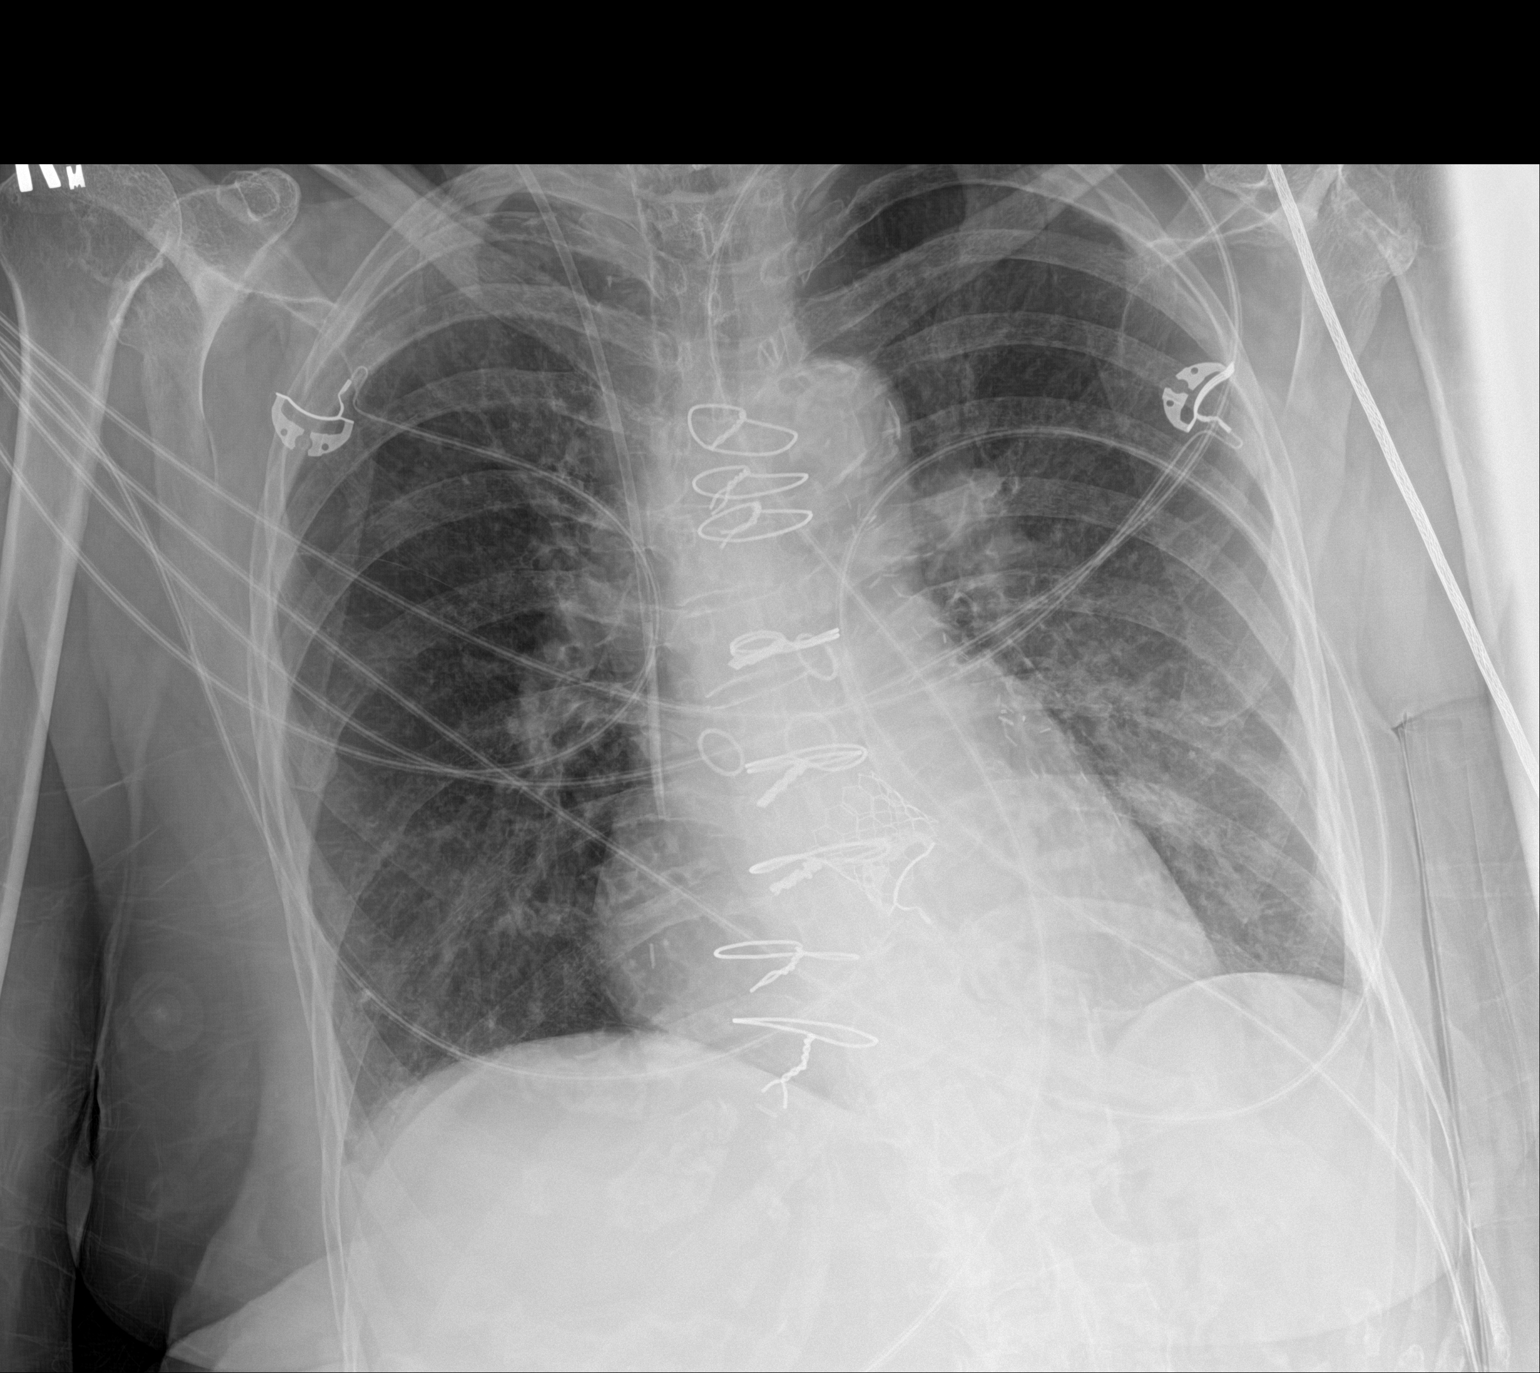

[1 of 1 positions shown; findings below may reference images not displayed]

FINDINGS: The lungs are well-expanded. The lung markings are coarse in the
left infrahilar region and are slightly more conspicuous than on the
previous study. There is no significant pleural effusion and there
is no pneumothorax or pneumomediastinum. The heart and pulmonary
vascularity are normal. The aortic valve cage appears to be in
reasonable position. There are stable changes of previous CABG.
There is calcification in the wall of the thoracic aorta. The right
internal jugular venous catheter tip projects over the distal third
of the SVC.
IMPRESSION: No immediate postprocedure complication. Mild chronic bronchitic
-reactive airway changes. Probable subsegmental atelectasis in the
left infrahilar region. No CHF. A follow-up PA and lateral chest
x-ray would be useful when the patient can tolerate the procedure.

Thoracic aortic atherosclerosis.

## 2018-06-19 NOTE — Telephone Encounter (Signed)
[facility] is requesting to fill pt Flovent Please advise [facility]

## 2018-06-27 ENCOUNTER — Other Ambulatory Visit: Payer: Self-pay | Admitting: Cardiovascular Disease

## 2018-06-29 ENCOUNTER — Ambulatory Visit: Payer: Self-pay | Admitting: Family Medicine

## 2018-07-12 ENCOUNTER — Other Ambulatory Visit: Payer: Self-pay | Admitting: Family Medicine

## 2018-08-03 ENCOUNTER — Encounter: Payer: Self-pay | Admitting: Cardiovascular Disease

## 2018-08-03 ENCOUNTER — Ambulatory Visit (INDEPENDENT_AMBULATORY_CARE_PROVIDER_SITE_OTHER): Payer: Medicare Other | Admitting: Cardiovascular Disease

## 2018-08-03 VITALS — BP 124/52 | HR 75 | Ht 65.0 in | Wt 111.4 lb

## 2018-08-03 DIAGNOSIS — I25708 Atherosclerosis of coronary artery bypass graft(s), unspecified, with other forms of angina pectoris: Secondary | ICD-10-CM

## 2018-08-03 DIAGNOSIS — I447 Left bundle-branch block, unspecified: Secondary | ICD-10-CM | POA: Diagnosis not present

## 2018-08-03 DIAGNOSIS — I6523 Occlusion and stenosis of bilateral carotid arteries: Secondary | ICD-10-CM

## 2018-08-03 DIAGNOSIS — I340 Nonrheumatic mitral (valve) insufficiency: Secondary | ICD-10-CM | POA: Diagnosis not present

## 2018-08-03 DIAGNOSIS — I350 Nonrheumatic aortic (valve) stenosis: Secondary | ICD-10-CM

## 2018-08-03 DIAGNOSIS — I480 Paroxysmal atrial fibrillation: Secondary | ICD-10-CM | POA: Diagnosis not present

## 2018-08-03 NOTE — Progress Notes (Signed)
Chief Complaint  Patient presents with  . Follow-up    CAD   History of Present Illness: 85 yo female with history of CAD s/p CABG, atrial fib s/p MAZE procedure, CVA, carotid artery disease s/p right CEA, fibromyalgia, GERD, mild to moderate MR and severe AS s/p TAVR who is here for cardiac follow up. She underwent CABG in 2006 with MAZE procedure. She was found to have severe AS by echo in July 2017 along with moderate MR. She initially refused treatment but underwent TAVR August 2018 with placement of a 23 mm Edwards Sapien 3 valve from the right femoral artery. She is known to have atrial fibrillation and has been on Eliquis. Cardiac monitor in 2018 in setting of dizziness showed pauses and possible second degree AV block. She was seen in the EP clinic by Dr. Lennie Odor and Cardizem was changed to Lopressor. Cardiac cath 10/27/16 with 3/4 patent bypass grafts. The LIMA to the Diagonal was atretic but vein grafts were open to the LAD, Circumflex and distal RCA.   She is known to have moderate carotid disease by last dopplers in June 2018. Echo August 2019 with LVEF=60-65%. Normally functioning bioprosthetic AVR. Mild mitral regurgitation.   She is here today for follow up. The patient denies any dyspnea, palpitations, lower extremity edema, orthopnea, PND, dizziness, near syncope or syncope. She has rare chest pains.   Primary Care Physician: Denita Lung, MD   Past Medical History:  Diagnosis Date  . Anxiety   . Arthritis    OSTEO  . Aspirin allergy    on Plavix  . Asthma   . Bronchitis   . CAD (coronary artery disease)    a. s/p CABG 2006 with Cox-Maze procedure.  . Carotid artery disease ([place])    a. s/p R CEA.  . Chronic stable angina ([place])   . Diastolic dysfunction   . Eczema   . Fibromyalgia   . GERD (gastroesophageal reflux disease)   . Heart murmur   . Hiatal hernia   . Hyperlipidemia   . Hypertension   . Kyphoscoliosis   . Mitral regurgitation   . Myocardial  infarction [facility]) 2003   Stent to CFX  . PAF (paroxysmal atrial fibrillation) ([place])    a. pt has h/o hematuria on Eliquis and has since refused anticoagulation  . Pneumonia 2015 ?  Marland Kitchen Pulmonary regurgitation   . Severe aortic stenosis   . Skin cancer of arm   . Stroke ([place])   . Tricuspid regurgitation     Past Surgical History:  Procedure Laterality Date  . ABDOMINAL HYSTERECTOMY  1976  . CAROTID ENDARTERECTOMY  2010  . CORONARY ANGIOPLASTY WITH STENT PLACEMENT    . CORONARY ARTERY BYPASS GRAFT  01/2005   LIMA-D1; SVG-LAD; SVG-OM; SVG-PDA  . EYE SURGERY    . MULTIPLE EXTRACTIONS WITH ALVEOLOPLASTY  12/28/2016   Extraction of tooth #'s 2- 5,7-10, 12,13,17-20,and 22-29 with alveoloplasty and maxillary right and left lateral exostoses reductions  . MULTIPLE EXTRACTIONS WITH ALVEOLOPLASTY N/A 12/28/2016   Procedure: Extraction of tooth #'s 2- 5,7-10, 12,13,17-20,and 22-29 with alveoloplasty and maxillary right and left lateral exostoses reductions;  Surgeon: Lenn Cal, DDS;  Location: [place];  Service: Oral Surgery;  Laterality: N/A;  . RIGHT/LEFT HEART CATH AND CORONARY/GRAFT ANGIOGRAPHY N/A 10/27/2016   Procedure: Right/Left Heart Cath and Coronary/Graft Angiography;  Surgeon: Burnell Blanks, MD;  Location: [place] [facility];  Service: Cardiovascular;  Laterality: N/A;  . TEE WITHOUT CARDIOVERSION N/A 01/03/2017  Procedure: TRANSESOPHAGEAL ECHOCARDIOGRAM (TEE);  Surgeon: Burnell Blanks, MD;  Location: [place];  Service: Open Heart Surgery;  Laterality: N/A;  . TONSILLECTOMY    . TRANSCATHETER AORTIC VALVE REPLACEMENT, TRANSFEMORAL N/A 01/03/2017   Procedure: TRANSCATHETER AORTIC VALVE REPLACEMENT, TRANSFEMORAL;  Surgeon: Burnell Blanks, MD;  Location: [place];  Service: Open Heart Surgery;  Laterality: N/A;  . TUMOR REMOVAL      Current Outpatient Medications  Medication Sig Dispense Refill  . acetaminophen (TYLENOL) 650 MG CR tablet Take 1,300 mg by mouth 2  (two) times daily as needed for pain.     Marland Kitchen albuterol (PROVENTIL HFA;VENTOLIN HFA) 108 (90 Base) MCG/ACT inhaler INHALE 2 PUFFS BY MOUTH EVERY 6 HOURS AS NEEDED FOR WHEEZING OR SHORTNESS OF BREATH 18 g 2  . calcium-vitamin D (OSCAL WITH D) 500-200 MG-UNIT per tablet Take 1 tablet by mouth daily with breakfast.    . cholecalciferol (VITAMIN D) 1000 UNITS tablet Take 1,000 Units by mouth daily.    . diazepam (VALIUM) 2 MG tablet Take 1 tablet (2 mg total) by mouth every 6 (six) hours as needed for anxiety. 30 tablet 1  . ELIQUIS 2.5 MG TABS tablet TAKE 1 TABLET BY MOUTH TWICE DAILY 60 tablet 5  . FLOVENT HFA 110 MCG/ACT inhaler INHALE 1 PUFF BY MOUTH TWICE DAILY 12 g 1  . fluticasone (FLONASE) 50 MCG/ACT nasal spray SHAKE LIQUID AND USE 2 SPRAYS IN EACH NOSTRIL EVERY DAY 48 g 0  . guaiFENesin (MUCINEX) 600 MG 12 hr tablet Take 1 tablet (600 mg total) by mouth 2 (two) times daily. 30 tablet 0  . guaiFENesin (ROBITUSSIN) 100 MG/5ML liquid Take 100 mg by mouth 3 (three) times daily as needed for cough. Reported on 12/08/2015    . irbesartan (AVAPRO) 75 MG tablet TAKE 1 TABLET BY MOUTH DAILY 90 tablet 2  . isosorbide dinitrate (ISORDIL) 30 MG tablet TAKE 1 TABLET BY MOUTH TWICE DAILY 180 tablet 3  . loratadine (CLARITIN) 10 MG tablet Take 10 mg by mouth daily.      . metoprolol tartrate (LOPRESSOR) 25 MG tablet TAKE 1/2 TABLET BY MOUTH TWICE DAILY 120 tablet 0  . nitroGLYCERIN (NITROSTAT) 0.4 MG SL tablet Place 1 tablet (0.4 mg total) under the tongue every 5 (five) minutes as needed for chest pain (Max 3 tabs.). 100 tablet 1  . pantoprazole (PROTONIX) 40 MG tablet TAKE 1 TABLET BY MOUTH TWICE DAILY 180 tablet 0  . rosuvastatin (CRESTOR) 40 MG tablet TAKE 1 TABLET BY MOUTH EVERY DAY 90 tablet 0  . traMADol (ULTRAM) 50 MG tablet Take 1 tablet (50 mg total) by mouth every 8 (eight) hours as needed. 50 tablet 1   No current facility-administered medications for this visit.     Allergies  Allergen  Reactions  . Bactrim [Sulfamethoxazole-Trimethoprim] Other (See Comments)    "makes her feel funny" or "unsteady"   . Amitriptyline Hcl Rash  . Aspirin Swelling and Rash    SWELLING REACTION UNSPECIFIED   . Zetia [Ezetimibe] Rash    Social History   Socioeconomic History  . Marital status: Widowed    Spouse name: Not on file  . Number of children: 3  . Years of education: Not on file  . Highest education level: Not on file  Occupational History    Employer: RETIRED  Social Needs  . Financial resource strain: Not on file  . Food insecurity:    Worry: Not on file    Inability: Not on file  .  Transportation needs:    Medical: Not on file    Non-medical: Not on file  Tobacco Use  . Smoking status: Never Smoker  . Smokeless tobacco: Former Systems developer    Types: Snuff  Substance and Sexual Activity  . Alcohol use: No  . Drug use: No  . Sexual activity: Not Currently  Lifestyle  . Physical activity:    Days per week: Not on file    Minutes per session: Not on file  . Stress: Not on file  Relationships  . Social connections:    Talks on phone: Not on file    Gets together: Not on file    Attends religious service: Not on file    Active member of club or organization: Not on file    Attends meetings of clubs or organizations: Not on file    Relationship status: Not on file  . Intimate partner violence:    Fear of current or ex partner: Not on file    Emotionally abused: Not on file    Physically abused: Not on file    Forced sexual activity: Not on file  Other Topics Concern  . Not on file  Social History Narrative  . Not on file    Family History  Problem Relation Age of Onset  . Heart failure Mother   . Other Father     Review of Systems:  As stated in the HPI and otherwise negative.   BP (!) 124/52   Pulse 75   Ht 5\' 5"  (1.651 m)   Wt 111 lb 6.4 oz (50.5 kg)   SpO2 98%   BMI 18.54 kg/m   Physical Examination:  General: Well developed, well nourished,  NAD  HEENT: OP clear, mucus membranes moist  SKIN: warm, dry. No rashes. Neuro: No focal deficits  Musculoskeletal: Muscle strength 5/5 all ext  Psychiatric: Mood and affect normal  Neck: No JVD, no carotid bruits, no thyromegaly, no lymphadenopathy.  Lungs:Clear bilaterally, no wheezes, rhonci, crackles Cardiovascular: Regular rate and rhythm. No murmurs, gallops or rubs. Abdomen:Soft. Bowel sounds present. Non-tender.  Extremities: No lower extremity edema. Pulses are 2 + in the bilateral DP/PT.  Echo August 2019: Left ventricle: The cavity size was normal. Systolic function was   normal. The estimated ejection fraction was in the range of 60%   to 65%. Wall motion was normal; there were no regional wall   motion abnormalities. Features are consistent with a pseudonormal   left ventricular filling pattern, with concomitant abnormal   relaxation and increased filling pressure (grade 2 diastolic   dysfunction). Doppler parameters are consistent with high   ventricular filling pressure. - Aortic valve: A 90mm Edwards Sapien 3 TAVR bioprosthesis was   present. Transvalvular velocity was within the normal range.   There was no stenosis. There was no regurgitation. Valve area   (VTI): 1.18 cm^2. Valve area (Vmax): 1.13 cm^2. Valve area   (Vmean): 1.2 cm^2. - Mitral valve: Calcified annulus. Moderately thickened, moderately   calcified leaflets . Transvalvular velocity was within the normal   range. There was no evidence for stenosis. There was mild   regurgitation. Valve area by continuity equation (using LVOT   flow): 1.53 cm^2. - Right ventricle: The cavity size was normal. Wall thickness was   normal. Systolic function was normal. - Tricuspid valve: There was mild regurgitation. - Pulmonary arteries: Systolic pressure was within the normal   range. PA peak pressure: 38 mm Hg (S).  EKG:  EKG is  ordered today. The ekg ordered today demonstrates Sinus, rate 75 bpm. 1st degree AV  block. PVC  Recent Labs: 12/20/2017: ALT 15; BUN 10; Creatinine, Ser 0.45; Hemoglobin 11.2; Platelets 182; Potassium 3.9; Sodium 143   Lipid Panel    Component Value Date/Time   CHOL 148 12/20/2017 1212   TRIG 70 12/20/2017 1212   HDL 77 12/20/2017 1212   CHOLHDL 1.9 12/20/2017 1212   CHOLHDL 2.0 08/21/2015 0001   VLDL 17 08/21/2015 0001   LDLCALC 57 12/20/2017 1212     Wt Readings from Last 3 Encounters:  08/03/18 111 lb 6.4 oz (50.5 kg)  12/28/17 110 lb 1.9 oz (50 kg)  12/20/17 109 lb 12.8 oz (49.8 kg)     Other studies Reviewed: Additional studies/ records that were reviewed today include: . Review of the above records demonstrates:   Assessment and Plan:   1. Severe aortic valve stenosis: She had TAVR in August 2018. Her bioprosthetic valve appears to be working well. She is on Eliquis due to prior LAA thrombus with atrial fib. She continues to use SBE prophylaxis as indicated.     2. Carotid artery disease: Moderate bilateral disease by dopplers 2018. Will repeat carotid dopplers in June 2020.   3. Mitral regurgitation: Mild by echo in Sept 2018. Will follow  4. CAD with stable angina: CAD stable by cath in June 2018. No chest pain. Continue statin, beta blocker and nitrate. NO ASA since she is on Eliquis.        5. Atrial fibrillation,paroxysmal: Sinus today. Continue Lopressor and Eliquis.     6. Intermittent LBBB: Present post TAVR. She has been seen in the EP clinic. No dizziness. No indication for a pacemaker.   7. Dizziness/bradycardia: Resolved. Follow up with EP as needed  Current medicines are reviewed at length with the patient today.  The patient does not have concerns regarding medicines.  The following changes have been made:  no change  Labs/ tests ordered today include:   Orders Placed This Encounter  Procedures  . EKG 12-Lead     Disposition:   FU with me in 10-14 months.    Signed, Lauree Chandler, MD 08/03/2018 3:00 PM    [address], [place], [place]  [address] Phone: [phone]; Fax: [phone]

## 2018-08-03 NOTE — Patient Instructions (Signed)
Medication Instructions:  Your physician recommends that you continue on your current medications as directed. Please refer to the Current Medication list given to you today.  If you need a refill on your cardiac medications before your next appointment, please call your pharmacy.   Lab work: none If you have labs (blood work) drawn today and your tests are completely normal, you will receive your results only by: Marland Kitchen MyChart Message (if you have MyChart) OR . A paper copy in the mail If you have any lab test that is abnormal or we need to change your treatment, we will call you to review the results.  Testing/Procedures: Your physician has requested that you have a carotid duplex. This test is an ultrasound of the carotid arteries in your neck. It looks at blood flow through these arteries that supply the brain with blood. Allow one hour for this exam. There are no restrictions or special instructions. To be done in June 2020    Follow-Up: At [facility], you and your health needs are our priority.  As part of our continuing mission to provide you with exceptional heart care, we have created designated Provider Care Teams.  These Care Teams include your primary Cardiologist (physician) and Advanced Practice Providers (APPs -  Physician Assistants and Nurse Practitioners) who all work together to provide you with the care you need, when you need it. You will need a follow up appointment in 10 -14 months.  Please call our office 2 months in advance to schedule this appointment.  You may see Lauree Chandler, MD or one of the following Advanced Practice Providers on your designated Care Team:   Krupp, PA-C Melina Copa, PA-C . Ermalinda Barrios, PA-C  Any Other Special Instructions Will Be Listed Below (If Applicable).

## 2018-08-29 ENCOUNTER — Other Ambulatory Visit: Payer: Self-pay | Admitting: Cardiovascular Disease

## 2018-09-01 ENCOUNTER — Other Ambulatory Visit: Payer: Self-pay | Admitting: Family Medicine

## 2018-09-01 DIAGNOSIS — K219 Gastro-esophageal reflux disease without esophagitis: Secondary | ICD-10-CM

## 2018-09-12 DIAGNOSIS — M654 Radial styloid tenosynovitis [de Quervain]: Secondary | ICD-10-CM | POA: Diagnosis not present

## 2018-09-14 ENCOUNTER — Encounter: Payer: Self-pay | Admitting: Podiatry

## 2018-09-14 ENCOUNTER — Ambulatory Visit: Payer: Medicare Other

## 2018-09-14 ENCOUNTER — Ambulatory Visit (INDEPENDENT_AMBULATORY_CARE_PROVIDER_SITE_OTHER): Payer: Medicare Other | Admitting: Podiatry

## 2018-09-14 ENCOUNTER — Other Ambulatory Visit: Payer: Self-pay

## 2018-09-14 VITALS — Temp 97.2°F

## 2018-09-14 DIAGNOSIS — I6523 Occlusion and stenosis of bilateral carotid arteries: Secondary | ICD-10-CM

## 2018-09-14 DIAGNOSIS — M79675 Pain in left toe(s): Secondary | ICD-10-CM

## 2018-09-14 DIAGNOSIS — B351 Tinea unguium: Secondary | ICD-10-CM | POA: Diagnosis not present

## 2018-09-14 DIAGNOSIS — L600 Ingrowing nail: Secondary | ICD-10-CM | POA: Diagnosis not present

## 2018-09-14 DIAGNOSIS — L03032 Cellulitis of left toe: Secondary | ICD-10-CM

## 2018-09-14 DIAGNOSIS — M79672 Pain in left foot: Secondary | ICD-10-CM

## 2018-09-14 DIAGNOSIS — M79674 Pain in right toe(s): Secondary | ICD-10-CM | POA: Diagnosis not present

## 2018-09-14 NOTE — Progress Notes (Signed)
Subjective:   Patient ID: [name], female   DOB: 85 y.o.   MRN: [id number]   HPI Patient presents with several problems 1 being chronic ingrown toenail the left big toe one being redness on the side of the toe and one being thickened nailbeds 1-5 both feet that she is not able to cut.  She presents with caregiver today who is not able to help her with these conditions   ROS      Objective:  Physical Exam  Neurovascular status intact with muscle strength unchanged or range of motion unchanged.  Patient is found to have a thickened incurvated hallux nail left both medial lateral borders being ingrown and on the lateral border there is redness and localized drainage with no proximal edema erythema drainage noted.  All nails are thickened yellow brittle and can become painful as they get grown out and she cannot cut     Assessment:  Ingrown toenail deformity left hallux x2 borders with chronic nature with paronychia infection of the left hallux lateral border that is painful with localized redness drainage.  Mycotic nail infection with pain     Plan:  H&P discussed all conditions.  Today I anesthetized the left big toe and sterile prep applied and using sterile instrumentation I remove the lateral border removed all proud flesh abscess tissue and then discussed consists iteration for long-term ingrown toenail correction but will hold off currently.  I then debrided nailbeds 1-5 right 2-5 left no iatrogenic bleeding reappoint for routine care and advised on soaks

## 2018-09-24 ENCOUNTER — Encounter: Payer: Self-pay | Admitting: Podiatry

## 2018-09-24 ENCOUNTER — Other Ambulatory Visit: Payer: Self-pay | Admitting: Family Medicine

## 2018-09-24 ENCOUNTER — Other Ambulatory Visit: Payer: Self-pay

## 2018-09-24 ENCOUNTER — Ambulatory Visit (INDEPENDENT_AMBULATORY_CARE_PROVIDER_SITE_OTHER): Payer: Medicare Other | Admitting: Podiatry

## 2018-09-24 VITALS — Temp 98.4°F

## 2018-09-24 DIAGNOSIS — M79674 Pain in right toe(s): Secondary | ICD-10-CM | POA: Diagnosis not present

## 2018-09-24 DIAGNOSIS — M79675 Pain in left toe(s): Secondary | ICD-10-CM

## 2018-09-24 DIAGNOSIS — B351 Tinea unguium: Secondary | ICD-10-CM | POA: Diagnosis not present

## 2018-09-24 NOTE — Progress Notes (Signed)
   HPI: Patient presents today for follow-up treatment regarding toe pain to the left great toe.  Patient was last seen on 09/14/2018 here in the office where she received routine nail care and she also had an ingrown toenail removed.  She presents for follow-up treatment  Past Medical History:  Diagnosis Date  . Anxiety   . Arthritis    OSTEO  . Aspirin allergy    on Plavix  . Asthma   . Bronchitis   . CAD (coronary artery disease)    a. s/p CABG 2006 with Cox-Maze procedure.  . Carotid artery disease ([place])    a. s/p R CEA.  . Chronic stable angina ([place])   . Diastolic dysfunction   . Eczema   . Fibromyalgia   . GERD (gastroesophageal reflux disease)   . Heart murmur   . Hiatal hernia   . Hyperlipidemia   . Hypertension   . Kyphoscoliosis   . Mitral regurgitation   . Myocardial infarction [facility]) 2003   Stent to CFX  . PAF (paroxysmal atrial fibrillation) ([place])    a. pt has h/o hematuria on Eliquis and has since refused anticoagulation  . Pneumonia 2015 ?  Marland Kitchen Pulmonary regurgitation   . Severe aortic stenosis   . Skin cancer of arm   . Stroke ([place])   . Tricuspid regurgitation      Physical Exam: General: The patient is alert and oriented x3 in no acute distress.  Dermatology: Skin is warm, dry and supple bilateral lower extremities. Negative for open lesions or macerations.  Hyperkeratotic thickened nails noted 1-5 bilateral  Vascular: Palpable pedal pulses bilaterally. No edema or erythema noted. Capillary refill within normal limits.  Neurological: Epicritic and protective threshold grossly intact bilaterally.   Musculoskeletal Exam: Range of motion within normal limits to all pedal and ankle joints bilateral. Muscle strength 5/5 in all groups bilateral.    Assessment: 1.  Status post removal of ingrown toenail left great toe 2.  Dystrophic nails 1-5 bilateral likely secondary to onychomycosis   Plan of Care:  1. Patient evaluated.  2.  Mechanical debridement  of nails 1-5 was performed using a nail nipper without incident or bleeding 3.  Continue wearing good supportive shoe gear 4.  Return to clinic in 3 months for routine care      Edrick Kins, DPM [facility]  Dr. Edrick Kins, DPM    [address], [place] [address]                Office [phone]  Fax [phone]

## 2018-10-01 ENCOUNTER — Other Ambulatory Visit: Payer: Self-pay | Admitting: Family Medicine

## 2018-10-01 ENCOUNTER — Other Ambulatory Visit: Payer: Self-pay

## 2018-10-01 ENCOUNTER — Encounter: Payer: Self-pay | Admitting: Family Medicine

## 2018-10-01 ENCOUNTER — Ambulatory Visit (INDEPENDENT_AMBULATORY_CARE_PROVIDER_SITE_OTHER): Payer: Medicare Other | Admitting: Family Medicine

## 2018-10-01 VITALS — Temp 97.6°F | Wt 112.0 lb

## 2018-10-01 DIAGNOSIS — R82998 Other abnormal findings in urine: Secondary | ICD-10-CM

## 2018-10-01 DIAGNOSIS — N3000 Acute cystitis without hematuria: Secondary | ICD-10-CM | POA: Diagnosis not present

## 2018-10-01 DIAGNOSIS — R300 Dysuria: Secondary | ICD-10-CM

## 2018-10-01 MED ORDER — NITROFURANTOIN MONOHYD MACRO 100 MG PO CAPS: 100.0000 mg | ORAL_CAPSULE | Freq: Two times a day (BID) | ORAL | 0 refills | Status: DC

## 2018-10-01 NOTE — Progress Notes (Signed)
   Subjective:  Documentation for virtual telephone encounter.  Interactive audio and video telecommunications were attempted between this provider and patient, however due to patient not having access to video capability, we continued and completed visit with audio only.  The patient was located at home. The provider was located in the office. The patient did consent to this visit and is aware of possible charges through their insurance for this visit.  The other persons participating in this telemedicine service were none.    Patient ID: [name], female    DOB: 06/20/1928, 85 y.o.   MRN: [id number]  HPI Chief Complaint  Patient presents with  . possible kidney infection    kidney infection- gets one about every 6 months, left side pain, yellow creamy urine,slight odor, burning, 3-4 days for these symptoms   Complains of urinary frequency, dysuria, dark urine and mild intermittent left flank pain for the past 3-4 days.  States she knows this is her usual UTI because she has it "every 6 months".  States she has done well in the past with Macrobid but she always needs it twice daily for 10 days.  Denies fever, chills, dizziness, chest pain, cough, shortness of breath, abdominal pain, N/V/D.   Reviewed allergies, medications, past medical, surgical, family, and social history.   Review of Systems Pertinent positives and negatives in the history of present illness.     Objective:   Physical Exam Temp 97.6 F (36.4 C) (Oral)   Wt 112 lb (50.8 kg)   BMI 18.64 kg/m   Alert and oriented and in no acute distress. Pleasant voice, with normal mood and thought process.      Assessment & Plan:  Acute cystitis without hematuria - Plan: nitrofurantoin, macrocrystal-monohydrate, (MACROBID) 100 MG capsule  Dysuria  Dark urine  Discussed limitations of virtual visit.  She expressed the difficulty she has getting out and declines coming in for a visit to give a urine specimen  and I am fine with this.  She does not appear to be in any distress and no red flag symptoms.  I will treat her empirically for UTI. Discussed with Dr. Redmond School the treatment plan she is requesting and ok to prescribe Macrobid #20 as she requests.  She will stay well hydrated and report back if symptoms are worsening or if not back to baseline after completing the antibiotic.   Time spent on call was 10 minutes and in review of previous records 3 minutes total.  This virtual service is not related to other E/M service within previous 7 days.  99441 (5-78min) 99442 (11-34min) 99443 (21-9min)

## 2018-10-08 ENCOUNTER — Other Ambulatory Visit: Payer: Self-pay | Admitting: Family Medicine

## 2018-10-19 ENCOUNTER — Other Ambulatory Visit: Payer: Self-pay | Admitting: Family Medicine

## 2018-11-02 ENCOUNTER — Ambulatory Visit (INDEPENDENT_AMBULATORY_CARE_PROVIDER_SITE_OTHER): Payer: Medicare Other | Admitting: Family Medicine

## 2018-11-02 ENCOUNTER — Encounter: Payer: Self-pay | Admitting: Family Medicine

## 2018-11-02 ENCOUNTER — Other Ambulatory Visit: Payer: Self-pay

## 2018-11-02 VITALS — BP 110/72 | HR 64 | Temp 98.0°F | Wt 110.2 lb

## 2018-11-02 DIAGNOSIS — Z953 Presence of xenogenic heart valve: Secondary | ICD-10-CM

## 2018-11-02 DIAGNOSIS — J309 Allergic rhinitis, unspecified: Secondary | ICD-10-CM | POA: Diagnosis not present

## 2018-11-02 DIAGNOSIS — J45909 Unspecified asthma, uncomplicated: Secondary | ICD-10-CM | POA: Diagnosis not present

## 2018-11-02 DIAGNOSIS — I6523 Occlusion and stenosis of bilateral carotid arteries: Secondary | ICD-10-CM | POA: Diagnosis not present

## 2018-11-02 DIAGNOSIS — R42 Dizziness and giddiness: Secondary | ICD-10-CM | POA: Diagnosis not present

## 2018-11-02 DIAGNOSIS — E2839 Other primary ovarian failure: Secondary | ICD-10-CM | POA: Diagnosis not present

## 2018-11-02 DIAGNOSIS — E785 Hyperlipidemia, unspecified: Secondary | ICD-10-CM | POA: Diagnosis not present

## 2018-11-02 DIAGNOSIS — I25118 Atherosclerotic heart disease of native coronary artery with other forms of angina pectoris: Secondary | ICD-10-CM | POA: Diagnosis not present

## 2018-11-02 DIAGNOSIS — I208 Other forms of angina pectoris: Secondary | ICD-10-CM | POA: Diagnosis not present

## 2018-11-02 DIAGNOSIS — M1990 Unspecified osteoarthritis, unspecified site: Secondary | ICD-10-CM

## 2018-11-02 DIAGNOSIS — N3000 Acute cystitis without hematuria: Secondary | ICD-10-CM | POA: Diagnosis not present

## 2018-11-02 DIAGNOSIS — K219 Gastro-esophageal reflux disease without esophagitis: Secondary | ICD-10-CM

## 2018-11-02 LAB — POCT URINALYSIS DIP (PROADVANTAGE DEVICE)
Bilirubin, UA: NEGATIVE
Blood, UA: NEGATIVE
Glucose, UA: NEGATIVE mg/dL
Nitrite, UA: NEGATIVE
Protein Ur, POC: NEGATIVE mg/dL
Specific Gravity, Urine: 1.015
Urobilinogen, Ur: 3.5
pH, UA: 6 (ref 5.0–8.0)

## 2018-11-02 LAB — LIPID PANEL

## 2018-11-02 MED ORDER — DIAZEPAM 2 MG PO TABS: 2.0000 mg | ORAL_TABLET | Freq: Four times a day (QID) | ORAL | 1 refills | Status: DC | PRN

## 2018-11-02 MED ORDER — FLOVENT HFA 220 MCG/ACT IN AERO: 2.0000 | INHALATION_SPRAY | Freq: Two times a day (BID) | RESPIRATORY_TRACT | 12 refills | Status: DC

## 2018-11-02 NOTE — Progress Notes (Signed)
[name] is a 85 y.o. female who presents for annual wellness visit and follow-up on chronic medical conditions.  She has arthritis and does complain of multiple orthopedic problems for which she is being seen by the specialist.  Her asthma seems to be under good control.  Her allergies are causing no difficulty and she continues on medications.  She has had no chest pain,.  She does complain of asthma and does use Flovent but also is using albuterol several times per week.  When asked to further comment on this, I could not get a straight answer from her as to how she chose to use the albuterol.  She does see her cardiologist regularly.  No chest pain or shortness of breath.  Her reflux seems to be under good control on her present PPI.  Her medications were reviewed.  As usual it was very difficult to get a straight and coherent answer out of her.  Her daughter is with her and that sometimes does help.  There is questions as to exactly what kinds of needs she would have with ADLs around the house and when asked to elucidate the I could not get a great answer from them.  Apparently there is a question concerning shortness of breath with physical activity but her daughter has been checking her pulse ox at home and the lowest number has been 94.  She was recently treated for UTI and is here for  Immunizations and Health Maintenance Immunization History  Administered Date(s) Administered  . Influenza Split 03/28/2012, 04/28/2015  . Influenza Whole 02/23/2009  . Influenza, High Dose Seasonal PF 03/14/2013  . Influenza-Unspecified 03/16/2018  . Pneumococcal Conjugate-13 06/14/2013  . Pneumococcal Polysaccharide-23 12/22/2008  . Tdap 10/11/2006  . Zoster 01/01/2009  . Zoster Recombinat (Shingrix) 09/11/2017   Health Maintenance Due  Topic Date Due  . DEXA SCAN  03/04/1996  . TETANUS/TDAP  10/10/2016    Last Pap smear: aged out  Last mammogram:Unkown Last colonoscopy:Unkown Last  DEXA:unkown Dentist:  2018 Ophtho: Unkown Exercise: very little   Other doctors caring for patient include:[name] eye, [name] podiatry, [name] cardio [place]  Advanced directives:no. Info given    Depression screen:  See questionnaire below.  Depression screen [facility] 2/9 11/02/2018 03/21/2018 03/27/2017 08/21/2015 01/06/2014  Decreased Interest 0 0 0 0 0  Down, Depressed, Hopeless 0 0 0 0 0  PHQ - 2 Score 0 0 0 0 0  Some recent data might be hidden    Fall Risk Screen: see questionnaire below. Fall Risk  11/02/2018 03/21/2018 03/27/2017 12/13/2016 08/21/2015  Falls in the past year? 0 No No Yes Yes  Comment - - - Emmi Telephone Survey: data to providers prior to load -  Number falls in past yr: - - - 1 1  Comment - - - Emmi Telephone Survey Actual Response = 1 -  Injury with Fall? - - - Yes No  Risk for fall due to : - - History of fall(s) - -  Follow up - - - - Falls evaluation completed    ADL screen:  See questionnaire below Functional Status Survey: Is the patient deaf or have difficulty hearing?: Yes Does the patient have difficulty seeing, even when wearing glasses/contacts?: No Does the patient have difficulty concentrating, remembering, or making decisions?: No Does the patient have difficulty walking or climbing stairs?: Yes Does the patient have difficulty dressing or bathing?: Yes Does the patient have difficulty doing errands alone such as visiting a  doctor's office or shopping?: Yes   Review of Systems Constitutional: -, -unexpected weight change, -anorexia, -fatigue   PHYSICAL EXAM:  BP 110/72 (BP Location: Left Arm, Patient Position: Sitting)   Pulse 64   Temp 98 F (36.7 C)   Wt 110 lb 3.2 oz (50 kg)   SpO2 98%   BMI 18.34 kg/m   General Appearance: Alert, cooperative, no distress, appears stated age Head: Normocephalic, without obvious abnormality, atraumatic Eyes: PERRL, conjunctiva/corneas clear, EOM's intact, fundi benign Ears: Normal TM's  and external ear canals Nose: Nares normal, mucosa normal, no drainage or sinus tenderness Throat: Lips, mucosa, and tongue normal; teeth and gums normal Neck: Supple, no lymphadenopathy;  thyroid:  no enlargement/tenderness/nodules; no carotid bruit or JVD Lungs: Clear to auscultation bilaterally without wheezes, rales or ronchi; respirations unlabored Heart: Regular rate and rhythm, S1 and S2 normal, no murmur, rubor gallop Psych: Normal mood, affect, hygiene and grooming.  ASSESSMENT/PLAN:  Allergic rhinitis, unspecified seasonality, unspecified trigger - Plan: .  Continue present medications.  Arthritis - Plan: No change in present plans  Uncomplicated asthma, unspecified asthma severity, unspecified whether persistent - Plan: fluticasone (FLOVENT HFA) 220 MCG/ACT inhaler, .  Coronary artery disease of native heart with stable angina pectoris, unspecified vessel or lesion type ([place]) - Plan: CBC with Differential/Platelet, Comprehensive metabolic panel, Lipid panel, .  Chronic stable angina (HCC) - Plan: CBC with Differential/Platelet, Comprehensive metabolic panel, Lipid panel, .  Gastroesophageal reflux disease without esophagitis - Plan: Continue present medication  Hyperlipidemia, unspecified hyperlipidemia type - Plan: Lipid panel, continue present medication  Status post transcatheter aortic valve replacement (TAVR) using bioprosthesis -   Estrogen deficiency - Plan: DG Bone Density, .  Vertigo - Plan: diazepam (VALIUM) 2 MG tablet, . At the end of the encounter she did mention difficulty with sleep and wanting a different chair to sleep and also difficulty with ambulation.  I will set her up for home health visit to assess her needs.  She is also to let me know how she is doing on the new inhaler.  Medicare Attestation I have personally reviewed: The patient's medical and social history Their use of alcohol, tobacco or illicit drugs Their current medications and  supplements The patient's functional ability including ADLs,fall risks, home safety risks, cognitive, and hearing and visual impairment Diet and physical activities Evidence for depression or mood disorders  The patient's weight, height, and BMI have been recorded in the chart.  I have made referrals, counseling, and provided education to the patient based on review of the above and I have provided the patient with a written personalized care plan for preventive services.     Jill Alexanders, MD   11/02/2018

## 2018-11-02 NOTE — Addendum Note (Signed)
Addended by: Elyse Jarvis on: 11/02/2018 03:22 PM   Modules accepted: Orders

## 2018-11-03 LAB — CBC WITH DIFFERENTIAL/PLATELET
Basophils Absolute: 0 10*3/uL (ref 0.0–0.2)
Basos: 0 %
EOS (ABSOLUTE): 0.1 10*3/uL (ref 0.0–0.4)
Eos: 2 %
Hematocrit: 33.9 % — ABNORMAL LOW (ref 34.0–46.6)
Hemoglobin: 11 g/dL — ABNORMAL LOW (ref 11.1–15.9)
Immature Grans (Abs): 0 10*3/uL (ref 0.0–0.1)
Immature Granulocytes: 0 %
Lymphocytes Absolute: 1.1 10*3/uL (ref 0.7–3.1)
Lymphs: 22 %
MCH: 30.4 pg (ref 26.6–33.0)
MCHC: 32.4 g/dL (ref 31.5–35.7)
MCV: 94 fL (ref 79–97)
Monocytes Absolute: 0.4 10*3/uL (ref 0.1–0.9)
Monocytes: 7 %
Neutrophils Absolute: 3.6 10*3/uL (ref 1.4–7.0)
Neutrophils: 69 %
Platelets: 158 10*3/uL (ref 150–450)
RBC: 3.62 x10E6/uL — ABNORMAL LOW (ref 3.77–5.28)
RDW: 12.9 % (ref 11.7–15.4)
WBC: 5.2 10*3/uL (ref 3.4–10.8)

## 2018-11-03 LAB — COMPREHENSIVE METABOLIC PANEL
ALT: 14 IU/L (ref 0–32)
AST: 21 IU/L (ref 0–40)
Albumin/Globulin Ratio: 2.2 (ref 1.2–2.2)
Albumin: 4.2 g/dL (ref 3.6–4.6)
Alkaline Phosphatase: 39 IU/L (ref 39–117)
BUN/Creatinine Ratio: 29 — ABNORMAL HIGH (ref 12–28)
BUN: 13 mg/dL (ref 8–27)
Bilirubin Total: 0.5 mg/dL (ref 0.0–1.2)
CO2: 23 mmol/L (ref 20–29)
Calcium: 9.6 mg/dL (ref 8.7–10.3)
Chloride: 104 mmol/L (ref 96–106)
Creatinine, Ser: 0.45 mg/dL — ABNORMAL LOW (ref 0.57–1.00)
GFR calc Af Amer: 104 mL/min/{1.73_m2} (ref >59)
GFR calc non Af Amer: 90 mL/min/{1.73_m2} (ref >59)
Globulin, Total: 1.9 g/dL (ref 1.5–4.5)
Glucose: 105 mg/dL — ABNORMAL HIGH (ref 65–99)
Potassium: 4.2 mmol/L (ref 3.5–5.2)
Sodium: 142 mmol/L (ref 134–144)
Total Protein: 6.1 g/dL (ref 6.0–8.5)

## 2018-11-03 LAB — LIPID PANEL
Chol/HDL Ratio: 1.9 ratio (ref 0.0–4.4)
Cholesterol, Total: 132 mg/dL (ref 100–199)
HDL: 70 mg/dL (ref >39)
LDL Calculated: 49 mg/dL (ref 0–99)
Triglycerides: 66 mg/dL (ref 0–149)
VLDL Cholesterol Cal: 13 mg/dL (ref 5–40)

## 2018-11-09 ENCOUNTER — Other Ambulatory Visit (HOSPITAL_COMMUNITY): Payer: Self-pay | Admitting: Cardiovascular Disease

## 2018-11-09 ENCOUNTER — Ambulatory Visit (HOSPITAL_COMMUNITY)
Admission: RE | Admit: 2018-11-09 | Discharge: 2018-11-09 | Disposition: A | Discharge status: Home / Self Care | Payer: Medicare Other | Source: Ambulatory Visit | Attending: Cardiology | Admitting: Cardiology

## 2018-11-09 ENCOUNTER — Other Ambulatory Visit: Payer: Self-pay

## 2018-11-09 DIAGNOSIS — I6523 Occlusion and stenosis of bilateral carotid arteries: Secondary | ICD-10-CM

## 2018-11-17 ENCOUNTER — Other Ambulatory Visit: Payer: Self-pay | Admitting: Family Medicine

## 2018-11-17 DIAGNOSIS — L03039 Cellulitis of unspecified toe: Secondary | ICD-10-CM

## 2018-11-17 MED ORDER — CEPHALEXIN 500 MG PO CAPS: 500.0000 mg | ORAL_CAPSULE | Freq: Three times a day (TID) | ORAL | 0 refills | Status: DC

## 2018-11-17 NOTE — Progress Notes (Signed)
She complains of an infection of the toe.I will call in Keflex and have her take a picture of the toe and call on Monday

## 2018-11-19 ENCOUNTER — Telehealth: Payer: Self-pay | Admitting: Family Medicine

## 2018-11-19 NOTE — Telephone Encounter (Signed)
Pt's daughter called and states that pt is feeling better. She states she had to call after hours this weekend due to a toe injury. She did send me a picture of her food thru my email. I did print the picture and have placed it in your folder. However my printer only prints in black and white. Please see me to look at picture VIA email in color.

## 2018-11-26 ENCOUNTER — Telehealth: Payer: Self-pay

## 2018-11-26 DIAGNOSIS — J45909 Unspecified asthma, uncomplicated: Secondary | ICD-10-CM | POA: Diagnosis not present

## 2018-11-26 DIAGNOSIS — I25119 Atherosclerotic heart disease of native coronary artery with unspecified angina pectoris: Secondary | ICD-10-CM | POA: Diagnosis not present

## 2018-11-26 DIAGNOSIS — Z953 Presence of xenogenic heart valve: Secondary | ICD-10-CM | POA: Diagnosis not present

## 2018-11-26 DIAGNOSIS — M130 Polyarthritis, unspecified: Secondary | ICD-10-CM | POA: Diagnosis not present

## 2018-11-26 DIAGNOSIS — R42 Dizziness and giddiness: Secondary | ICD-10-CM | POA: Diagnosis not present

## 2018-11-26 NOTE — Telephone Encounter (Signed)
[facility] is requesting to have a verbal skilled nursing once a week for three weeks. Please advise . [name]

## 2018-11-26 NOTE — Telephone Encounter (Signed)
[facility] was advised [facility]

## 2018-11-26 NOTE — Telephone Encounter (Signed)
ok 

## 2018-11-27 DIAGNOSIS — M130 Polyarthritis, unspecified: Secondary | ICD-10-CM | POA: Diagnosis not present

## 2018-11-27 DIAGNOSIS — I25119 Atherosclerotic heart disease of native coronary artery with unspecified angina pectoris: Secondary | ICD-10-CM | POA: Diagnosis not present

## 2018-11-27 DIAGNOSIS — R42 Dizziness and giddiness: Secondary | ICD-10-CM | POA: Diagnosis not present

## 2018-11-27 DIAGNOSIS — Z953 Presence of xenogenic heart valve: Secondary | ICD-10-CM | POA: Diagnosis not present

## 2018-11-27 DIAGNOSIS — J45909 Unspecified asthma, uncomplicated: Secondary | ICD-10-CM | POA: Diagnosis not present

## 2018-11-28 DIAGNOSIS — Z953 Presence of xenogenic heart valve: Secondary | ICD-10-CM | POA: Diagnosis not present

## 2018-11-28 DIAGNOSIS — R42 Dizziness and giddiness: Secondary | ICD-10-CM | POA: Diagnosis not present

## 2018-11-28 DIAGNOSIS — M130 Polyarthritis, unspecified: Secondary | ICD-10-CM | POA: Diagnosis not present

## 2018-11-28 DIAGNOSIS — J45909 Unspecified asthma, uncomplicated: Secondary | ICD-10-CM | POA: Diagnosis not present

## 2018-11-28 DIAGNOSIS — I25119 Atherosclerotic heart disease of native coronary artery with unspecified angina pectoris: Secondary | ICD-10-CM | POA: Diagnosis not present

## 2018-11-29 ENCOUNTER — Other Ambulatory Visit: Payer: Self-pay | Admitting: Family Medicine

## 2018-11-29 DIAGNOSIS — K219 Gastro-esophageal reflux disease without esophagitis: Secondary | ICD-10-CM

## 2018-12-06 DIAGNOSIS — I25119 Atherosclerotic heart disease of native coronary artery with unspecified angina pectoris: Secondary | ICD-10-CM | POA: Diagnosis not present

## 2018-12-06 DIAGNOSIS — J45909 Unspecified asthma, uncomplicated: Secondary | ICD-10-CM | POA: Diagnosis not present

## 2018-12-06 DIAGNOSIS — Z953 Presence of xenogenic heart valve: Secondary | ICD-10-CM | POA: Diagnosis not present

## 2018-12-06 DIAGNOSIS — R42 Dizziness and giddiness: Secondary | ICD-10-CM | POA: Diagnosis not present

## 2018-12-06 DIAGNOSIS — M130 Polyarthritis, unspecified: Secondary | ICD-10-CM | POA: Diagnosis not present

## 2018-12-11 DIAGNOSIS — I25119 Atherosclerotic heart disease of native coronary artery with unspecified angina pectoris: Secondary | ICD-10-CM | POA: Diagnosis not present

## 2018-12-11 DIAGNOSIS — M130 Polyarthritis, unspecified: Secondary | ICD-10-CM | POA: Diagnosis not present

## 2018-12-11 DIAGNOSIS — Z953 Presence of xenogenic heart valve: Secondary | ICD-10-CM | POA: Diagnosis not present

## 2018-12-11 DIAGNOSIS — R42 Dizziness and giddiness: Secondary | ICD-10-CM | POA: Diagnosis not present

## 2018-12-11 DIAGNOSIS — J45909 Unspecified asthma, uncomplicated: Secondary | ICD-10-CM | POA: Diagnosis not present

## 2018-12-15 ENCOUNTER — Other Ambulatory Visit: Payer: Self-pay | Admitting: Family Medicine

## 2018-12-15 ENCOUNTER — Other Ambulatory Visit: Payer: Self-pay | Admitting: Cardiovascular Disease

## 2018-12-15 DIAGNOSIS — L03039 Cellulitis of unspecified toe: Secondary | ICD-10-CM

## 2018-12-15 MED ORDER — CEPHALEXIN 500 MG PO CAPS: 500.0000 mg | ORAL_CAPSULE | Freq: Three times a day (TID) | ORAL | 0 refills | Status: DC

## 2018-12-15 NOTE — Progress Notes (Signed)
Toe infection has returned. Keflex was called in

## 2018-12-17 ENCOUNTER — Other Ambulatory Visit: Payer: Self-pay | Admitting: *Deleted

## 2018-12-17 MED ORDER — APIXABAN 2.5 MG PO TABS: 2.5000 mg | ORAL_TABLET | Freq: Two times a day (BID) | ORAL | 5 refills | Status: DC

## 2018-12-17 NOTE — Telephone Encounter (Signed)
Pt last saw Dr Angelena Form 08/03/18, last labs 11/02/18 Creat 0.45, age 85, weight 50kg, based on specified criteria pt is on appropriate dosage of Eliquis 2.5mg  BID.  Will refill rx.

## 2018-12-20 ENCOUNTER — Other Ambulatory Visit: Payer: Self-pay | Admitting: Family Medicine

## 2018-12-20 ENCOUNTER — Ambulatory Visit: Payer: Medicare Other | Admitting: Podiatry

## 2018-12-20 DIAGNOSIS — M1990 Unspecified osteoarthritis, unspecified site: Secondary | ICD-10-CM

## 2018-12-20 NOTE — Telephone Encounter (Signed)
[facility] is requesting to fill pt tramadol. Please advise [facility] 

## 2018-12-21 DIAGNOSIS — M130 Polyarthritis, unspecified: Secondary | ICD-10-CM | POA: Diagnosis not present

## 2018-12-21 DIAGNOSIS — J45909 Unspecified asthma, uncomplicated: Secondary | ICD-10-CM | POA: Diagnosis not present

## 2018-12-21 DIAGNOSIS — R42 Dizziness and giddiness: Secondary | ICD-10-CM | POA: Diagnosis not present

## 2018-12-21 DIAGNOSIS — Z953 Presence of xenogenic heart valve: Secondary | ICD-10-CM | POA: Diagnosis not present

## 2018-12-21 DIAGNOSIS — I25119 Atherosclerotic heart disease of native coronary artery with unspecified angina pectoris: Secondary | ICD-10-CM | POA: Diagnosis not present

## 2018-12-24 ENCOUNTER — Other Ambulatory Visit: Payer: Self-pay

## 2018-12-24 ENCOUNTER — Ambulatory Visit (INDEPENDENT_AMBULATORY_CARE_PROVIDER_SITE_OTHER): Payer: Medicare Other

## 2018-12-24 ENCOUNTER — Encounter: Payer: Self-pay | Admitting: Podiatry

## 2018-12-24 ENCOUNTER — Other Ambulatory Visit: Payer: Self-pay | Admitting: Podiatry

## 2018-12-24 ENCOUNTER — Ambulatory Visit (INDEPENDENT_AMBULATORY_CARE_PROVIDER_SITE_OTHER): Payer: Medicare Other | Admitting: Podiatry

## 2018-12-24 VITALS — Temp 98.3°F

## 2018-12-24 DIAGNOSIS — M79671 Pain in right foot: Secondary | ICD-10-CM

## 2018-12-24 DIAGNOSIS — B351 Tinea unguium: Secondary | ICD-10-CM | POA: Diagnosis not present

## 2018-12-24 DIAGNOSIS — M79676 Pain in unspecified toe(s): Secondary | ICD-10-CM

## 2018-12-24 DIAGNOSIS — S90129A Contusion of unspecified lesser toe(s) without damage to nail, initial encounter: Secondary | ICD-10-CM

## 2018-12-24 DIAGNOSIS — L03032 Cellulitis of left toe: Secondary | ICD-10-CM | POA: Diagnosis not present

## 2018-12-26 NOTE — Progress Notes (Signed)
   SUBJECTIVE Patient presents to office today complaining of elongated, thickened nails that cause pain while ambulating in shoes. She is unable to trim her own nails. Patient is here for further evaluation and treatment.  Past Medical History:  Diagnosis Date  . Anxiety   . Arthritis    OSTEO  . Aspirin allergy    on Plavix  . Asthma   . Bronchitis   . CAD (coronary artery disease)    a. s/p CABG 2006 with Cox-Maze procedure.  . Carotid artery disease ([place])    a. s/p R CEA.  . Chronic stable angina ([place])   . Diastolic dysfunction   . Eczema   . Fibromyalgia   . GERD (gastroesophageal reflux disease)   . Heart murmur   . Hiatal hernia   . Hyperlipidemia   . Hypertension   . Kyphoscoliosis   . Mitral regurgitation   . Myocardial infarction [facility]) 2003   Stent to CFX  . PAF (paroxysmal atrial fibrillation) ([place])    a. pt has h/o hematuria on Eliquis and has since refused anticoagulation  . Pneumonia 2015 ?  Marland Kitchen Pulmonary regurgitation   . Severe aortic stenosis   . Skin cancer of arm   . Stroke ([place])   . Tricuspid regurgitation     OBJECTIVE General Patient is awake, alert, and oriented x 3 and in no acute distress. Derm Skin is dry and supple bilateral. Negative open lesions or macerations. Remaining integument unremarkable. Nails are tender, long, thickened and dystrophic with subungual debris, consistent with onychomycosis, 1-5 bilateral. Dermatitis noted to the right dorsal foot.  Vasc  DP and PT pedal pulses palpable bilaterally. Temperature gradient within normal limits.  Neuro Epicritic and protective threshold sensation grossly intact bilaterally.  Musculoskeletal Exam No symptomatic pedal deformities noted bilateral. Muscular strength within normal limits.  Radiographic Exam:  Normal osseous mineralization. Joint spaces preserved. No fracture/dislocation/boney destruction.    ASSESSMENT 1. Onychodystrophic nails 1-5 bilateral with hyperkeratosis of nails.  2.  Onychomycosis of nail due to dermatophyte bilateral 3. Dermatitis right dorsal foot   PLAN OF CARE 1. Patient evaluated today. X-Rays reviewed.  2. Instructed to maintain good pedal hygiene and foot care.  3. Mechanical debridement of nails 1-5 bilaterally performed using a nail nipper. Filed with dremel without incident.  4. Recommended OTC hydrocortisone 1% cream.  5. Return to clinic in 3 mos.    Edrick Kins, DPM [facility]  Dr. Edrick Kins, [place]                                        [place], [place] [address]                Office [phone]  Fax [phone]

## 2019-01-06 ENCOUNTER — Other Ambulatory Visit: Payer: Self-pay | Admitting: Family Medicine

## 2019-01-08 ENCOUNTER — Ambulatory Visit (INDEPENDENT_AMBULATORY_CARE_PROVIDER_SITE_OTHER): Payer: Medicare Other | Admitting: Family Medicine

## 2019-01-08 ENCOUNTER — Other Ambulatory Visit: Payer: Self-pay

## 2019-01-08 ENCOUNTER — Encounter: Payer: Self-pay | Admitting: Family Medicine

## 2019-01-08 VITALS — BP 100/68 | HR 71 | Temp 98.2°F | Wt 110.8 lb

## 2019-01-08 DIAGNOSIS — I6523 Occlusion and stenosis of bilateral carotid arteries: Secondary | ICD-10-CM

## 2019-01-08 DIAGNOSIS — R300 Dysuria: Secondary | ICD-10-CM | POA: Diagnosis not present

## 2019-01-08 LAB — POCT URINALYSIS DIP (PROADVANTAGE DEVICE)
Bilirubin, UA: NEGATIVE
Blood, UA: NEGATIVE
Glucose, UA: NEGATIVE mg/dL
Ketones, POC UA: NEGATIVE mg/dL
Leukocytes, UA: NEGATIVE
Nitrite, UA: NEGATIVE
Protein Ur, POC: NEGATIVE mg/dL
Specific Gravity, Urine: 1.01
Urobilinogen, Ur: 0.2
pH, UA: 7 (ref 5.0–8.0)

## 2019-01-08 NOTE — Progress Notes (Signed)
   Subjective:    Patient ID: [name], female    DOB: 1928-08-13, 85 y.o.   MRN: [id number]  HPI She complains of a several day history of difficulty with frequency and questionable dysuria.  It is always very difficult to get a good history from her as she does tend to ramble.  No fever, chills, abdominal pain.   Review of Systems     Objective:   Physical Exam Alert and in no distress.  Urine microscopic was negative       Assessment & Plan:   Encounter Diagnosis  Name Primary?  . Dysuria Yes  I explained that there was no evidence of any type of infection.  Encouraged her to continue to drink fluids and if further difficulty, call me.

## 2019-01-14 DIAGNOSIS — L309 Dermatitis, unspecified: Secondary | ICD-10-CM | POA: Diagnosis not present

## 2019-01-16 ENCOUNTER — Telehealth: Payer: Self-pay

## 2019-01-16 NOTE — Telephone Encounter (Signed)
Called pt to set up apt with CM. Pt said she is covered on her arms and legs with a rash and would like to try for an apt in 4 weeks when it has cleared up.

## 2019-01-18 ENCOUNTER — Other Ambulatory Visit: Payer: Self-pay | Admitting: Cardiovascular Disease

## 2019-01-18 MED ORDER — NITROGLYCERIN 0.4 MG SL SUBL: SUBLINGUAL_TABLET | SUBLINGUAL | 1 refills | Status: DC

## 2019-01-24 ENCOUNTER — Other Ambulatory Visit: Payer: Self-pay | Admitting: Cardiovascular Disease

## 2019-01-24 ENCOUNTER — Telehealth: Payer: Self-pay | Admitting: Family Medicine

## 2019-01-24 MED ORDER — ALBUTEROL SULFATE HFA 108 (90 BASE) MCG/ACT IN AERS: INHALATION_SPRAY | RESPIRATORY_TRACT | 2 refills | Status: DC

## 2019-01-24 NOTE — Telephone Encounter (Signed)
Refill being sent per ok by provider.

## 2019-01-24 NOTE — Telephone Encounter (Signed)
Recv'd fax from [facility] pt needs refill Albuterol HFA inhaler

## 2019-01-24 NOTE — Telephone Encounter (Signed)
pls send refill

## 2019-01-31 ENCOUNTER — Other Ambulatory Visit: Payer: Self-pay | Admitting: Family Medicine

## 2019-02-06 ENCOUNTER — Ambulatory Visit (INDEPENDENT_AMBULATORY_CARE_PROVIDER_SITE_OTHER): Payer: Medicare Other | Admitting: Cardiovascular Disease

## 2019-02-06 ENCOUNTER — Other Ambulatory Visit: Payer: Self-pay

## 2019-02-06 ENCOUNTER — Encounter: Payer: Self-pay | Admitting: Cardiovascular Disease

## 2019-02-06 VITALS — BP 128/60 | HR 85 | Ht 65.0 in | Wt 111.0 lb

## 2019-02-06 DIAGNOSIS — I6523 Occlusion and stenosis of bilateral carotid arteries: Secondary | ICD-10-CM

## 2019-02-06 DIAGNOSIS — I25708 Atherosclerosis of coronary artery bypass graft(s), unspecified, with other forms of angina pectoris: Secondary | ICD-10-CM

## 2019-02-06 DIAGNOSIS — I480 Paroxysmal atrial fibrillation: Secondary | ICD-10-CM | POA: Diagnosis not present

## 2019-02-06 DIAGNOSIS — I340 Nonrheumatic mitral (valve) insufficiency: Secondary | ICD-10-CM

## 2019-02-06 DIAGNOSIS — I447 Left bundle-branch block, unspecified: Secondary | ICD-10-CM | POA: Diagnosis not present

## 2019-02-06 DIAGNOSIS — I350 Nonrheumatic aortic (valve) stenosis: Secondary | ICD-10-CM | POA: Diagnosis not present

## 2019-02-06 NOTE — Progress Notes (Signed)
Chief Complaint  Patient presents with  . Follow-up    CAD   History of Present Illness: 85 yo female with history of CAD s/p CABG, atrial fib s/p MAZE procedure, CVA, carotid artery disease s/p right CEA, fibromyalgia, GERD, mild to moderate MR and severe AS s/p TAVR who is here for cardiac follow up. She underwent CABG in 2006 with MAZE procedure. She was found to have severe AS by echo in July 2017 along with moderate MR. She initially refused treatment but underwent TAVR August 2018 with placement of a 23 mm Edwards Sapien 3 valve from the right femoral artery. She is known to have atrial fibrillation and has been on Eliquis. Cardiac monitor in 2018 in setting of dizziness showed pauses and possible second degree AV block. She was seen in the EP clinic by Dr. Lennie Odor and Cardizem was changed to Lopressor. Cardiac cath 10/27/16 with 3/4 patent bypass grafts. The LIMA to the Diagonal was atretic but vein grafts were open to the LAD, Circumflex and distal RCA.   She is known to have moderate carotid disease by last dopplers in June 2018. Echo August 2019 with LVEF=60-65%. Normally functioning bioprosthetic AVR. Mild mitral regurgitation.   She is here today for follow up. The patient denies any chest pain, palpitations, lower extremity edema, orthopnea, PND, dizziness, near syncope or syncope. She has ongoing dyspnea which is unchanged.   Primary Care Physician: Denita Lung, MD   Past Medical History:  Diagnosis Date  . Anxiety   . Arthritis    OSTEO  . Aspirin allergy    on Plavix  . Asthma   . Bronchitis   . CAD (coronary artery disease)    a. s/p CABG 2006 with Cox-Maze procedure.  . Carotid artery disease ([place])    a. s/p R CEA.  . Chronic stable angina ([place])   . Diastolic dysfunction   . Eczema   . Fibromyalgia   . GERD (gastroesophageal reflux disease)   . Heart murmur   . Hiatal hernia   . Hyperlipidemia   . Hypertension   . Kyphoscoliosis   . Mitral regurgitation    . Myocardial infarction [facility]) 2003   Stent to CFX  . PAF (paroxysmal atrial fibrillation) ([place])    a. pt has h/o hematuria on Eliquis and has since refused anticoagulation  . Pneumonia 2015 ?  Marland Kitchen Pulmonary regurgitation   . Severe aortic stenosis   . Skin cancer of arm   . Stroke ([place])   . Tricuspid regurgitation     Past Surgical History:  Procedure Laterality Date  . ABDOMINAL HYSTERECTOMY  1976  . CAROTID ENDARTERECTOMY  2010  . CORONARY ANGIOPLASTY WITH STENT PLACEMENT    . CORONARY ARTERY BYPASS GRAFT  01/2005   LIMA-D1; SVG-LAD; SVG-OM; SVG-PDA  . EYE SURGERY    . MULTIPLE EXTRACTIONS WITH ALVEOLOPLASTY  12/28/2016   Extraction of tooth #'s 2- 5,7-10, 12,13,17-20,and 22-29 with alveoloplasty and maxillary right and left lateral exostoses reductions  . MULTIPLE EXTRACTIONS WITH ALVEOLOPLASTY N/A 12/28/2016   Procedure: Extraction of tooth #'s 2- 5,7-10, 12,13,17-20,and 22-29 with alveoloplasty and maxillary right and left lateral exostoses reductions;  Surgeon: Lenn Cal, DDS;  Location: [place];  Service: Oral Surgery;  Laterality: N/A;  . RIGHT/LEFT HEART CATH AND CORONARY/GRAFT ANGIOGRAPHY N/A 10/27/2016   Procedure: Right/Left Heart Cath and Coronary/Graft Angiography;  Surgeon: Burnell Blanks, MD;  Location: [facility];  Service: Cardiovascular;  Laterality: N/A;  . TEE WITHOUT  CARDIOVERSION N/A 01/03/2017   Procedure: TRANSESOPHAGEAL ECHOCARDIOGRAM (TEE);  Surgeon: Burnell Blanks, MD;  Location: [place];  Service: Open Heart Surgery;  Laterality: N/A;  . TONSILLECTOMY    . TRANSCATHETER AORTIC VALVE REPLACEMENT, TRANSFEMORAL N/A 01/03/2017   Procedure: TRANSCATHETER AORTIC VALVE REPLACEMENT, TRANSFEMORAL;  Surgeon: Burnell Blanks, MD;  Location: [place];  Service: Open Heart Surgery;  Laterality: N/A;  . TUMOR REMOVAL      Current Outpatient Medications  Medication Sig Dispense Refill  . acetaminophen (TYLENOL) 650 MG CR tablet Take 1,300  mg by mouth 2 (two) times daily as needed for pain.     Marland Kitchen albuterol (VENTOLIN HFA) 108 (90 Base) MCG/ACT inhaler INHALE 2 PUFFS BY MOUTH EVERY 6 HOURS AS NEEDED FOR WHEEZING OR SHORTNESS OF BREATH 18 g 2  . apixaban (ELIQUIS) 2.5 MG TABS tablet Take 1 tablet (2.5 mg total) by mouth 2 (two) times daily. 60 tablet 5  . calcium-vitamin D (OSCAL WITH D) 500-200 MG-UNIT per tablet Take 1 tablet by mouth daily with breakfast.    . cholecalciferol (VITAMIN D) 1000 UNITS tablet Take 1,000 Units by mouth daily.    . diazepam (VALIUM) 2 MG tablet Take 1 tablet (2 mg total) by mouth every 6 (six) hours as needed for anxiety. 30 tablet 1  . fluticasone (FLONASE) 50 MCG/ACT nasal spray SHAKE LIQUID AND USE 2 SPRAYS IN EACH NOSTRIL EVERY DAY 48 g 0  . fluticasone (FLOVENT HFA) 220 MCG/ACT inhaler Inhale 2 puffs into the lungs 2 (two) times a day. 1 Inhaler 12  . guaiFENesin (MUCINEX) 600 MG 12 hr tablet Take 1 tablet (600 mg total) by mouth 2 (two) times daily. 30 tablet 0  . guaiFENesin (ROBITUSSIN) 100 MG/5ML liquid Take 100 mg by mouth 3 (three) times daily as needed for cough. Reported on 12/08/2015    . irbesartan (AVAPRO) 75 MG tablet TAKE 1 TABLET BY MOUTH DAILY 90 tablet 2  . isosorbide dinitrate (ISORDIL) 30 MG tablet TAKE 1 TABLET BY MOUTH TWICE DAILY 180 tablet 1  . loratadine (CLARITIN) 10 MG tablet Take 10 mg by mouth daily.      . metoprolol tartrate (LOPRESSOR) 25 MG tablet TAKE ONE-HALF TABLET BY MOUTH TWICE DAILY. 120 tablet 0  . metoprolol tartrate (LOPRESSOR) 25 MG tablet TAKE 1/2 TABLET BY MOUTH TWICE DAILY 120 tablet 0  . nitrofurantoin, macrocrystal-monohydrate, (MACROBID) 100 MG capsule Take 1 capsule (100 mg total) by mouth 2 (two) times daily. 20 capsule 0  . nitroGLYCERIN (NITROSTAT) 0.4 MG SL tablet PLACE 1 TABLET UNDER THE TONGUE EVERY 5 MINUTES AS NEEDED FOR CHEST PAIN 9 MAX 3 TABLETS) 75 tablet 1  . pantoprazole (PROTONIX) 40 MG tablet TAKE 1 TABLET BY MOUTH TWICE DAILY 180 tablet 0   . rosuvastatin (CRESTOR) 40 MG tablet TAKE 1 TABLET BY MOUTH EVERY DAY 90 tablet 0  . traMADol (ULTRAM) 50 MG tablet TAKE 1 TABLET(50 MG) BY MOUTH EVERY 8 HOURS AS NEEDED 50 tablet 1   No current facility-administered medications for this visit.     Allergies  Allergen Reactions  . Bactrim [Sulfamethoxazole-Trimethoprim] Other (See Comments)    "makes her feel funny" or "unsteady"   . Amitriptyline Hcl Rash  . Aspirin Swelling and Rash    SWELLING REACTION UNSPECIFIED   . Zetia [Ezetimibe] Rash    Social History   Socioeconomic History  . Marital status: Widowed    Spouse name: Not on file  . Number of children: 3  . Years  of education: Not on file  . Highest education level: Not on file  Occupational History    Employer: RETIRED  Social Needs  . Financial resource strain: Not on file  . Food insecurity    Worry: Not on file    Inability: Not on file  . Transportation needs    Medical: Not on file    Non-medical: Not on file  Tobacco Use  . Smoking status: Never Smoker  . Smokeless tobacco: Former Systems developer    Types: Snuff  Substance and Sexual Activity  . Alcohol use: No  . Drug use: No  . Sexual activity: Not Currently  Lifestyle  . Physical activity    Days per week: Not on file    Minutes per session: Not on file  . Stress: Not on file  Relationships  . Social Herbalist on phone: Not on file    Gets together: Not on file    Attends religious service: Not on file    Active member of club or organization: Not on file    Attends meetings of clubs or organizations: Not on file    Relationship status: Not on file  . Intimate partner violence    Fear of current or ex partner: Not on file    Emotionally abused: Not on file    Physically abused: Not on file    Forced sexual activity: Not on file  Other Topics Concern  . Not on file  Social History Narrative  . Not on file    Family History  Problem Relation Age of Onset  . Heart failure Mother    . Other Father     Review of Systems:  As stated in the HPI and otherwise negative.   BP 128/60   Pulse 85   Ht 5\' 5"  (1.651 m)   Wt 111 lb (50.3 kg)   SpO2 95%   BMI 18.47 kg/m   Physical Examination:  General: Well developed, well nourished, NAD  HEENT: OP clear, mucus membranes moist  SKIN: warm, dry. No rashes. Neuro: No focal deficits  Musculoskeletal: Muscle strength 5/5 all ext  Psychiatric: Mood and affect normal  Neck: No JVD, no carotid bruits, no thyromegaly, no lymphadenopathy.  Lungs:Clear bilaterally, no wheezes, rhonci, crackles Cardiovascular: Regular rate and rhythm. Soft systolic murmur.  Abdomen:Soft. Bowel sounds present. Non-tender.  Extremities: No lower extremity edema. Pulses are 2 + in the bilateral DP/PT.  Echo August 2019: Left ventricle: The cavity size was normal. Systolic function was   normal. The estimated ejection fraction was in the range of 60%   to 65%. Wall motion was normal; there were no regional wall   motion abnormalities. Features are consistent with a pseudonormal   left ventricular filling pattern, with concomitant abnormal   relaxation and increased filling pressure (grade 2 diastolic   dysfunction). Doppler parameters are consistent with high   ventricular filling pressure. - Aortic valve: A 49mm Edwards Sapien 3 TAVR bioprosthesis was   present. Transvalvular velocity was within the normal range.   There was no stenosis. There was no regurgitation. Valve area   (VTI): 1.18 cm^2. Valve area (Vmax): 1.13 cm^2. Valve area   (Vmean): 1.2 cm^2. - Mitral valve: Calcified annulus. Moderately thickened, moderately   calcified leaflets . Transvalvular velocity was within the normal   range. There was no evidence for stenosis. There was mild   regurgitation. Valve area by continuity equation (using LVOT   flow): 1.53 cm^2. - Right  ventricle: The cavity size was normal. Wall thickness was   normal. Systolic function was normal. -  Tricuspid valve: There was mild regurgitation. - Pulmonary arteries: Systolic pressure was within the normal   range. PA peak pressure: 38 mm Hg (S).  EKG:  EKG is  Not ordered today. The ekg ordered today demonstrates   Recent Labs: 11/02/2018: ALT 14; BUN 13; Creatinine, Ser 0.45; Hemoglobin 11.0; Platelets 158; Potassium 4.2; Sodium 142   Lipid Panel    Component Value Date/Time   CHOL 132 11/02/2018 1045   TRIG 66 11/02/2018 1045   HDL 70 11/02/2018 1045   CHOLHDL 1.9 11/02/2018 1045   CHOLHDL 2.0 08/21/2015 0001   VLDL 17 08/21/2015 0001   LDLCALC 49 11/02/2018 1045     Wt Readings from Last 3 Encounters:  02/06/19 111 lb (50.3 kg)  01/08/19 110 lb 12.8 oz (50.3 kg)  11/02/18 110 lb 3.2 oz (50 kg)     Other studies Reviewed: Additional studies/ records that were reviewed today include: . Review of the above records demonstrates:   Assessment and Plan:   1. Severe aortic valve stenosis: She had TAVR in August 2018. She is doing well. Continue Eliquis due to prior LAA thrombus with atrial fib. She continues to use SBE prophylaxis as indicated.     2. Carotid artery disease: Mild to moderate bilateral disease by dopplers June 2020.    3. Mitral regurgitation: Mild by echo in Sept 2018. Will follow  4. CAD with stable angina: CAD stable by cath in June 2018. She has not chest pain. Will continue beta blocker, statin and Imdur. She is not on ASA since she is on Eliquis.        5. Atrial fibrillation,paroxysmal: She appears to be in sinus today. Will continue beta blocker and Eliquis. .     6. Intermittent LBBB: Present post TAVR. She has been seen in the EP clinic. No dizziness. No indication for a pacemaker.    Current medicines are reviewed at length with the patient today.  The patient does not have concerns regarding medicines.  The following changes have been made:  no change  Labs/ tests ordered today include:   No orders of the defined types were placed in  this encounter.    Disposition:   FU with me in 12 months.    Signed, Lauree Chandler, MD 02/06/2019 12:22 PM    [facility] [address], [place], [place]  [address] Phone: [phone]; Fax: [phone]

## 2019-02-06 NOTE — Patient Instructions (Signed)
Medication Instructions:  Your physician recommends that you continue on your current medications as directed. Please refer to the Current Medication list given to you today.  If you need a refill on your cardiac medications before your next appointment, please call your pharmacy.   Lab work: None If you have labs (blood work) drawn today and your tests are completely normal, you will receive your results only by: . MyChart Message (if you have MyChart) OR . A paper copy in the mail If you have any lab test that is abnormal or we need to change your treatment, we will call you to review the results.  Testing/Procedures: None  Follow-Up: At [facility], you and your health needs are our priority.  As part of our continuing mission to provide you with exceptional heart care, we have created designated Provider Care Teams.  These Care Teams include your primary Cardiologist (physician) and Advanced Practice Providers (APPs -  Physician Assistants and Nurse Practitioners) who all work together to provide you with the care you need, when you need it. You will need a follow up appointment in 12 months.  Please call our office 2 months in advance to schedule this appointment.  You may see Christopher McAlhany, MD or one of the following Advanced Practice Providers on your designated Care Team:   Brittainy Simmons, PA-C Dayna Dunn, PA-C . Michele Lenze, PA-C  Any Other Special Instructions Will Be Listed Below (If Applicable).    

## 2019-02-18 DIAGNOSIS — Z23 Encounter for immunization: Secondary | ICD-10-CM | POA: Diagnosis not present

## 2019-02-24 ENCOUNTER — Other Ambulatory Visit: Payer: Self-pay | Admitting: Family Medicine

## 2019-02-24 DIAGNOSIS — K219 Gastro-esophageal reflux disease without esophagitis: Secondary | ICD-10-CM

## 2019-03-25 ENCOUNTER — Ambulatory Visit (INDEPENDENT_AMBULATORY_CARE_PROVIDER_SITE_OTHER): Payer: Medicare Other | Admitting: Podiatry

## 2019-03-25 ENCOUNTER — Other Ambulatory Visit: Payer: Self-pay

## 2019-03-25 DIAGNOSIS — M79676 Pain in unspecified toe(s): Secondary | ICD-10-CM | POA: Diagnosis not present

## 2019-03-25 DIAGNOSIS — L600 Ingrowing nail: Secondary | ICD-10-CM

## 2019-03-25 DIAGNOSIS — B351 Tinea unguium: Secondary | ICD-10-CM | POA: Diagnosis not present

## 2019-03-25 MED ORDER — DOXYCYCLINE HYCLATE 100 MG PO TABS: 100.0000 mg | ORAL_TABLET | Freq: Two times a day (BID) | ORAL | 0 refills | Status: DC

## 2019-03-25 MED ORDER — GENTAMICIN SULFATE 0.1 % EX CREA: 1.0000 "application " | TOPICAL_CREAM | Freq: Two times a day (BID) | CUTANEOUS | 1 refills | Status: DC

## 2019-03-25 NOTE — Patient Instructions (Signed)

## 2019-03-27 ENCOUNTER — Ambulatory Visit: Payer: Medicare Other | Admitting: Podiatry

## 2019-03-28 ENCOUNTER — Other Ambulatory Visit: Payer: Self-pay | Admitting: Cardiovascular Disease

## 2019-03-28 MED ORDER — NITROGLYCERIN 0.4 MG SL SUBL: SUBLINGUAL_TABLET | SUBLINGUAL | 2 refills | Status: DC

## 2019-03-28 NOTE — Progress Notes (Signed)
Subjective: Patient presents today for evaluation of pain to the medial and lateral borders of the bilateral great toes that began a few months ago. Patient is concerned for possible ingrown nail. Wearing shoes and applying pressure to the toes increases the pain. She has not had any recent treatment for the symptoms. Patient presents today for further treatment and evaluation.  Past Medical History:  Diagnosis Date  . Anxiety   . Arthritis    OSTEO  . Aspirin allergy    on Plavix  . Asthma   . Bronchitis   . CAD (coronary artery disease)    a. s/p CABG 2006 with Cox-Maze procedure.  . Carotid artery disease ([place])    a. s/p R CEA.  . Chronic stable angina ([place])   . Diastolic dysfunction   . Eczema   . Fibromyalgia   . GERD (gastroesophageal reflux disease)   . Heart murmur   . Hiatal hernia   . Hyperlipidemia   . Hypertension   . Kyphoscoliosis   . Mitral regurgitation   . Myocardial infarction [facility]) 2003   Stent to CFX  . PAF (paroxysmal atrial fibrillation) ([place])    a. pt has h/o hematuria on Eliquis and has since refused anticoagulation  . Pneumonia 2015 ?  Marland Kitchen Pulmonary regurgitation   . Severe aortic stenosis   . Skin cancer of arm   . Stroke ([place])   . Tricuspid regurgitation     Objective:  General: Well developed, nourished, in no acute distress, alert and oriented x3   Dermatology: Skin is warm, dry and supple bilateral. Medial and lateral borders of bilateral great toes appears to be erythematous with evidence of an ingrowing nail. Pain on palpation noted to the border of the nail fold. The remaining nails appear unremarkable at this time. There are no open sores, lesions.  Vascular: Dorsalis Pedis artery and Posterior Tibial artery pedal pulses palpable. No lower extremity edema noted.   Neruologic: Grossly intact via light touch bilateral.  Musculoskeletal: Muscular strength within normal limits in all groups bilateral. Normal range of motion noted to all  pedal and ankle joints.   Assesement: #1 Paronychia with ingrowing nail medial and lateral borders of bilateral great toes #2 Pain in toe #3 Incurvated nail  Plan of Care:  1. Patient evaluated.  2. Discussed treatment alternatives and plan of care. Explained nail avulsion procedure and post procedure course to patient. 3. Patient opted for permanent total nail avulsion of the medial and lateral borders of bilateral great toes.  4. Prior to procedure, local anesthesia infiltration utilized using 3 ml of a 50:50 mixture of 2% plain lidocaine and 0.5% plain marcaine in a normal hallux block fashion and a betadine prep performed.  5. Total permanent nail avulsion with chemical matrixectomy performed using [id number] applications of phenol followed by alcohol flush.  6. Light dressing applied. 7. Prescription for Doxycycline provided to patient.  8. Prescription for Gentamicin cream provided to patient to use daily with a bandage.  9. Return to clinic in 3 weeks.  Edrick Kins, DPM [facility]  Dr. Edrick Kins, [address]                                        [place], [place] [address]  Office [phone]  Fax [phone]

## 2019-04-05 ENCOUNTER — Other Ambulatory Visit: Payer: Self-pay | Admitting: Family Medicine

## 2019-04-13 ENCOUNTER — Other Ambulatory Visit: Payer: Self-pay | Admitting: Family Medicine

## 2019-04-15 NOTE — Telephone Encounter (Signed)
[facility] is requesting to fill pt albuterol inhaler. Please advise [facility] 

## 2019-04-17 ENCOUNTER — Ambulatory Visit: Payer: Medicare Other | Admitting: Podiatry

## 2019-04-22 ENCOUNTER — Ambulatory Visit (INDEPENDENT_AMBULATORY_CARE_PROVIDER_SITE_OTHER): Payer: Medicare Other | Admitting: Podiatry

## 2019-04-22 ENCOUNTER — Other Ambulatory Visit: Payer: Self-pay

## 2019-04-22 DIAGNOSIS — M79676 Pain in unspecified toe(s): Secondary | ICD-10-CM | POA: Diagnosis not present

## 2019-04-22 DIAGNOSIS — B351 Tinea unguium: Secondary | ICD-10-CM

## 2019-04-22 DIAGNOSIS — L600 Ingrowing nail: Secondary | ICD-10-CM | POA: Diagnosis not present

## 2019-04-25 NOTE — Progress Notes (Signed)
   Subjective: 85 y.o. female presents today status post total permanent nail avulsion procedure of the bilateral great toes that was performed on 03/25/2019. She states she is doing well. She has been taking Doxycycline and using Gentamicin as directed which has been helping alleviate any infection symptoms. She denies any aggravating factors at this time. Patient is here for further evaluation and treatment.    Past Medical History:  Diagnosis Date  . Anxiety   . Arthritis    OSTEO  . Aspirin allergy    on Plavix  . Asthma   . Bronchitis   . CAD (coronary artery disease)    a. s/p CABG 2006 with Cox-Maze procedure.  . Carotid artery disease ([place])    a. s/p R CEA.  . Chronic stable angina ([place])   . Diastolic dysfunction   . Eczema   . Fibromyalgia   . GERD (gastroesophageal reflux disease)   . Heart murmur   . Hiatal hernia   . Hyperlipidemia   . Hypertension   . Kyphoscoliosis   . Mitral regurgitation   . Myocardial infarction [facility]) 2003   Stent to CFX  . PAF (paroxysmal atrial fibrillation) ([place])    a. pt has h/o hematuria on Eliquis and has since refused anticoagulation  . Pneumonia 2015 ?  Marland Kitchen Pulmonary regurgitation   . Severe aortic stenosis   . Skin cancer of arm   . Stroke ([place])   . Tricuspid regurgitation     Objective: Skin is warm, dry and supple. Nail and respective nail fold appears to be healing appropriately. Open wound to the associated nail fold with a granular wound base and moderate amount of fibrotic tissue. Minimal drainage noted. Mild erythema around the periungual region likely due to phenol chemical matricectomy.  Assessment: #1 postop permanent total nail avulsion bilateral great toenails  #2 open wound periungual nail fold of respective digits.   Plan of care: #1 patient was evaluated  #2 debridement of open wound was performed to the periungual border of the respective toe using a currette. Antibiotic ointment and Band-Aid was applied. #3  patient is to return to clinic on a PRN basis.   Edrick Kins, DPM [facility]  Dr. Edrick Kins, [address]                                        [place], [place] [address]                Office [phone]  Fax [phone]

## 2019-05-27 ENCOUNTER — Other Ambulatory Visit: Payer: Self-pay | Admitting: Family Medicine

## 2019-05-27 DIAGNOSIS — K219 Gastro-esophageal reflux disease without esophagitis: Secondary | ICD-10-CM

## 2019-05-28 DIAGNOSIS — Z1211 Encounter for screening for malignant neoplasm of colon: Secondary | ICD-10-CM | POA: Diagnosis not present

## 2019-05-28 DIAGNOSIS — K5900 Constipation, unspecified: Secondary | ICD-10-CM | POA: Diagnosis not present

## 2019-05-28 DIAGNOSIS — R140 Abdominal distension (gaseous): Secondary | ICD-10-CM | POA: Diagnosis not present

## 2019-05-28 DIAGNOSIS — K219 Gastro-esophageal reflux disease without esophagitis: Secondary | ICD-10-CM | POA: Diagnosis not present

## 2019-05-28 DIAGNOSIS — R634 Abnormal weight loss: Secondary | ICD-10-CM | POA: Diagnosis not present

## 2019-06-26 ENCOUNTER — Other Ambulatory Visit: Payer: Self-pay | Admitting: Family Medicine

## 2019-06-27 ENCOUNTER — Other Ambulatory Visit: Payer: Self-pay | Admitting: *Deleted

## 2019-06-27 MED ORDER — APIXABAN 2.5 MG PO TABS: 2.5000 mg | ORAL_TABLET | Freq: Two times a day (BID) | ORAL | 5 refills | Status: DC

## 2019-06-27 NOTE — Addendum Note (Signed)
Addended by: Derrel Nip B on: 06/27/2019 03:06 PM   Modules accepted: Orders

## 2019-06-27 NOTE — Telephone Encounter (Signed)
Eliquis 2.5mg  refill request received. Pt is 86 years old, weight-50.3kg, Crea-0.45 on 11/02/2018, Diagnosis-Afib, and last seen by Dr. Angelena Form on 02/06/2019. Dose is appropriate based on dosing criteria. Will send in refill to requested pharmacy.

## 2019-07-04 ENCOUNTER — Other Ambulatory Visit: Payer: Self-pay | Admitting: Family Medicine

## 2019-07-30 ENCOUNTER — Other Ambulatory Visit: Payer: Self-pay

## 2019-07-30 MED ORDER — ISOSORBIDE DINITRATE 30 MG PO TABS: 30.0000 mg | ORAL_TABLET | Freq: Two times a day (BID) | ORAL | 0 refills | Status: DC

## 2019-08-21 ENCOUNTER — Other Ambulatory Visit: Payer: Self-pay | Admitting: Family Medicine

## 2019-08-21 DIAGNOSIS — K219 Gastro-esophageal reflux disease without esophagitis: Secondary | ICD-10-CM

## 2019-08-22 ENCOUNTER — Ambulatory Visit (INDEPENDENT_AMBULATORY_CARE_PROVIDER_SITE_OTHER): Payer: Medicare Other | Admitting: Family Medicine

## 2019-08-22 ENCOUNTER — Other Ambulatory Visit: Payer: Self-pay

## 2019-08-22 ENCOUNTER — Encounter: Payer: Self-pay | Admitting: Family Medicine

## 2019-08-22 VITALS — BP 120/66 | HR 64 | Temp 98.0°F

## 2019-08-22 DIAGNOSIS — K5900 Constipation, unspecified: Secondary | ICD-10-CM | POA: Diagnosis not present

## 2019-08-22 DIAGNOSIS — K439 Ventral hernia without obstruction or gangrene: Secondary | ICD-10-CM

## 2019-08-22 NOTE — Progress Notes (Signed)
   Subjective:    Patient ID: [name], female    DOB: Jan 26, 1929, 86 y.o.   MRN: [id number]  HPI She is here for consult concerning her bowel habits and a lump in the lower right abdominal area.  She does use Benefiber but does not use it on a regular basis.  She apparently does have hemorrhoids and is concerned about them.  She also has a lesion in the right lower quadrant that does get larger when she coughs or sneezes.   Review of Systems     Objective:   Physical Exam Alert and in no distress.  Exam of her lower abdominal area does show slight bulge in her abdominal wall superior to the inguinal ligament.  It is nontender.  It does bulge with coughing.       Assessment & Plan:  Constipation, unspecified constipation type  Abdominal wall hernia I discussed good bowel habits in terms of having soft BMs but not necessarily on a daily basis.  Recommend she use the Benefiber daily to help with this as well as drinking plenty of fluids.  If she continues have difficulty I will switch her to MiraLAX. Then discussed the abdominal wall hernia explaining that since she is not having any problems with this, no further intervention was necessary.  She was comfortable with that.

## 2019-08-29 ENCOUNTER — Encounter: Payer: Self-pay | Admitting: Family Medicine

## 2019-09-09 ENCOUNTER — Other Ambulatory Visit: Payer: Self-pay

## 2019-09-09 MED ORDER — IRBESARTAN 75 MG PO TABS: 75.0000 mg | ORAL_TABLET | Freq: Every day | ORAL | 1 refills | Status: DC

## 2019-09-18 ENCOUNTER — Other Ambulatory Visit: Payer: Self-pay

## 2019-09-18 MED ORDER — NITROGLYCERIN 0.4 MG SL SUBL: SUBLINGUAL_TABLET | SUBLINGUAL | 1 refills | Status: DC

## 2019-09-19 ENCOUNTER — Telehealth: Payer: Self-pay | Admitting: Cardiovascular Disease

## 2019-09-19 NOTE — Telephone Encounter (Signed)
We are recommending the COVID-19 vaccine to all of our patients. Cardiac medications (including blood thinners) should not deter anyone from being vaccinated and there is no need to hold any of those medications prior to vaccine administration.     Currently, there is a hotline to call (active 05/31/19) to schedule vaccination appointments as no walk-ins will be accepted.   Number: [phone].    If an appointment is not available please go to [email or web address] to sign up for notification when additional vaccine appointments are available.   If you have further questions or concerns about the vaccine process, please visit [email or web address] or contact your primary care physician.  Verbalized Understanding. 

## 2019-09-20 ENCOUNTER — Other Ambulatory Visit: Payer: Self-pay | Admitting: Family Medicine

## 2019-09-20 DIAGNOSIS — Z23 Encounter for immunization: Secondary | ICD-10-CM | POA: Diagnosis not present

## 2019-09-22 ENCOUNTER — Other Ambulatory Visit: Payer: Self-pay | Admitting: Family Medicine

## 2019-10-01 ENCOUNTER — Other Ambulatory Visit: Payer: Self-pay | Admitting: Family Medicine

## 2019-10-11 DIAGNOSIS — Z23 Encounter for immunization: Secondary | ICD-10-CM | POA: Diagnosis not present

## 2019-10-28 ENCOUNTER — Other Ambulatory Visit: Payer: Self-pay

## 2019-10-28 MED ORDER — ISOSORBIDE DINITRATE 30 MG PO TABS: 30.0000 mg | ORAL_TABLET | Freq: Two times a day (BID) | ORAL | 0 refills | Status: DC

## 2019-11-08 ENCOUNTER — Ambulatory Visit: Payer: Self-pay | Admitting: Family Medicine

## 2019-11-11 ENCOUNTER — Ambulatory Visit (HOSPITAL_COMMUNITY)
Admission: RE | Admit: 2019-11-11 | Discharge: 2019-11-11 | Disposition: A | Discharge status: Home / Self Care | Payer: Medicare Other | Source: Ambulatory Visit | Attending: Cardiovascular Disease | Admitting: Cardiovascular Disease

## 2019-11-11 ENCOUNTER — Other Ambulatory Visit (HOSPITAL_COMMUNITY): Payer: Self-pay | Admitting: Cardiovascular Disease

## 2019-11-11 DIAGNOSIS — Z9889 Other specified postprocedural states: Secondary | ICD-10-CM

## 2019-11-11 DIAGNOSIS — I6523 Occlusion and stenosis of bilateral carotid arteries: Secondary | ICD-10-CM

## 2019-11-13 ENCOUNTER — Telehealth: Payer: Self-pay

## 2019-11-13 ENCOUNTER — Encounter: Payer: Self-pay | Admitting: Family Medicine

## 2019-11-13 ENCOUNTER — Ambulatory Visit (INDEPENDENT_AMBULATORY_CARE_PROVIDER_SITE_OTHER): Payer: Medicare Other | Admitting: Family Medicine

## 2019-11-13 VITALS — BP 94/62 | HR 65 | Temp 97.3°F | Wt 101.6 lb

## 2019-11-13 DIAGNOSIS — E2839 Other primary ovarian failure: Secondary | ICD-10-CM

## 2019-11-13 DIAGNOSIS — R42 Dizziness and giddiness: Secondary | ICD-10-CM

## 2019-11-13 DIAGNOSIS — I6523 Occlusion and stenosis of bilateral carotid arteries: Secondary | ICD-10-CM

## 2019-11-13 DIAGNOSIS — J309 Allergic rhinitis, unspecified: Secondary | ICD-10-CM

## 2019-11-13 DIAGNOSIS — E785 Hyperlipidemia, unspecified: Secondary | ICD-10-CM

## 2019-11-13 DIAGNOSIS — M25512 Pain in left shoulder: Secondary | ICD-10-CM

## 2019-11-13 DIAGNOSIS — Z9181 History of falling: Secondary | ICD-10-CM

## 2019-11-13 DIAGNOSIS — Z953 Presence of xenogenic heart valve: Secondary | ICD-10-CM | POA: Diagnosis not present

## 2019-11-13 DIAGNOSIS — M1990 Unspecified osteoarthritis, unspecified site: Secondary | ICD-10-CM

## 2019-11-13 DIAGNOSIS — I6389 Other cerebral infarction: Secondary | ICD-10-CM

## 2019-11-13 DIAGNOSIS — J45909 Unspecified asthma, uncomplicated: Secondary | ICD-10-CM

## 2019-11-13 DIAGNOSIS — R609 Edema, unspecified: Secondary | ICD-10-CM

## 2019-11-13 DIAGNOSIS — I340 Nonrheumatic mitral (valve) insufficiency: Secondary | ICD-10-CM

## 2019-11-13 DIAGNOSIS — H9113 Presbycusis, bilateral: Secondary | ICD-10-CM

## 2019-11-13 DIAGNOSIS — I071 Rheumatic tricuspid insufficiency: Secondary | ICD-10-CM | POA: Diagnosis not present

## 2019-11-13 DIAGNOSIS — Z13820 Encounter for screening for osteoporosis: Secondary | ICD-10-CM

## 2019-11-13 DIAGNOSIS — I779 Disorder of arteries and arterioles, unspecified: Secondary | ICD-10-CM

## 2019-11-13 DIAGNOSIS — I480 Paroxysmal atrial fibrillation: Secondary | ICD-10-CM

## 2019-11-13 DIAGNOSIS — K219 Gastro-esophageal reflux disease without esophagitis: Secondary | ICD-10-CM

## 2019-11-13 NOTE — Progress Notes (Signed)
[name] is a 86 y.o. female who presents for annual wellness visit and follow-up on chronic medical conditions.  She has multiple cardiac conditions including tricuspid regurgitation, TAVR, PAF, mitral regurg, history of CABG and is followed regularly by cardiology.  Presently she is having no chest pain, shortness of breath, PND or DOE.  She does have a history of dependent edema but none at the present time.  Is not very physically active.  She does have difficulty occasionally with vertigo which responds very nicely to as needed diazepam.  She has been seen recently by orthopedics for her shoulder pain which seems to be doing fairly well at this point.  She is hard of hearing but apparently cannot afford hearing aids.  She continues on Crestor and is having no difficulty with that.  Her allergies seem to be under good control using Flonase and Claritin.  She occasionally will use Ventolin for her asthma.  Reflux is under good control with Protonix.  She does have a history of stroke but presently no neurologic symptoms.  Arthritis keeps her fairly sedentary and she uses a wheelchair quite regularly.  She did fall recently but has had no residual symptoms from that.  Immunizations and Health Maintenance Immunization History  Administered Date(s) Administered  . Influenza Split 03/28/2012, 04/28/2015  . Influenza Whole 02/23/2009  . Influenza, High Dose Seasonal PF 03/14/2013, 03/03/2017, 03/16/2018, 02/18/2019  . Influenza-Unspecified 03/16/2018  . Pneumococcal Conjugate-13 06/14/2013  . Pneumococcal Polysaccharide-23 12/22/2008  . Tdap 10/11/2006, 07/14/2017, 11/09/2018  . Zoster 01/01/2009  . Zoster Recombinat (Shingrix) 09/10/2017, 12/12/2017   Health Maintenance Due  Topic Date Due  . COVID-19 Vaccine (1) Never done  . DEXA SCAN  Never done    Last Pap smear: aged out Last mammogram: n/a Last colonoscopy:  Over five years Last DEXA: ordered Dentist: N/A  Ophtho: n/a Exercise:  not much   Other doctors caring for patient include: Dr. Collene Mares GI, Dr. Angelena Form cardio, Dr. Amalia Hailey podiatry,   Advanced directives: Does Patient Have a Medical Advance Directive?: No Would patient like information on creating a medical advance directive?: No - Patient declined  Depression screen:  See questionnaire below.  Depression screen [facility] 2/9 11/13/2019 11/02/2018 03/21/2018 03/27/2017 08/21/2015  Decreased Interest 0 0 0 0 0  Down, Depressed, Hopeless 0 0 0 0 0  PHQ - 2 Score 0 0 0 0 0  Some recent data might be hidden    Fall Risk Screen: see questionnaire below. Fall Risk  11/13/2019 11/02/2018 03/21/2018 03/27/2017 12/13/2016  Falls in the past year? 1 0 No No Yes  Comment - - - - [facility] Telephone Survey: data to providers prior to load  Number falls in past yr: 0 - - - 1  Comment - - - - [facility]i Telephone Survey Actual Response = 1  Injury with Fall? 0 - - - Yes  Risk for fall due to : - - - History of fall(s) -  Follow up - - - - -    ADL screen:  See questionnaire below Functional Status Survey: Is the patient deaf or have difficulty hearing?: Yes Does the patient have difficulty seeing, even when wearing glasses/contacts?: Yes Does the patient have difficulty concentrating, remembering, or making decisions?: No Does the patient have difficulty walking or climbing stairs?: Yes (due to back) Does the patient have difficulty dressing or bathing?: No Does the patient have difficulty doing errands alone such as visiting a doctor's office or shopping?: Yes (daughter helps)  Review of Systems Constitutional: -, -unexpected weight change, -anorexia, -fatigue  Dermatology: denies changing moles, rash, lumps ENT: -runny nose, -ear pain, -sore throat,  Cardiology:  -chest pain, -palpitations, -orthopnea, Respiratory: -cough, -shortness of breath, -dyspnea on exertion, -wheezing,  Gastroenterology: -abdominal pain, -nausea, -vomiting, -diarrhea, -constipation, -dysphagia Hematology:  -bleeding or bruising problems Musculoskeletal: -arthralgias, -myalgias, -joint swelling, -back pain, - Ophthalmology: -vision changes,  Urology: -dysuria, -difficulty urinating,  -urinary frequency, -urgency, incontinence Neurology: -, -numbness, , -memory loss, -falls, -dizziness    PHYSICAL EXAM:   General Appearance: Alert, cooperative, no distress, appears stated age Head: Normocephalic, without obvious abnormality, atraumatic Eyes: PERRL, conjunctiva/corneas clear, EOM's intact,  Ears: Normal TM's and external ear canals Nose: Nares normal, mucosa normal, no drainage or sinus tenderness Throat: Lips, mucosa, and tongue normal; teeth and gums normal Neck: Supple, no lymphadenopathy;  thyroid:  no enlargement/tenderness/nodules; no carotid bruit or JVD Lungs: Clear to auscultation bilaterally without wheezes, rales or ronchi; respirations unlabored Heart: Irregular rate and rhythm, S1 and S2 normal, no murmur, rubor gallop Abdomen: Soft, non-tender, nondistended, normoactive bowel sounds,  no masses, no hepatosplenomegaly Extremities: No clubbing, cyanosis or edema Pulses: 2+ and symmetric all extremities Skin:  Skin color, texture, turgor normal, no rashes or lesions Lymph nodes: Cervical, supraclavicular, and axillary nodes normal Neurologic:  CNII-XII intact, normal strength, sensation and gait; reflexes 2+ and symmetric throughout Psych: Normal mood, affect, hygiene and grooming.  ASSESSMENT/PLAN: PAF (paroxysmal atrial fibrillation) (HCC) - Plan: CBC with Differential/Platelet, Comprehensive metabolic panel  Vertigo  Tricuspid valve insufficiency, unspecified etiology  Status post transcatheter aortic valve replacement (TAVR) using bioprosthesis  Mitral valve insufficiency, unspecified etiology  Left shoulder pain, unspecified chronicity  Gastroesophageal reflux disease without esophagitis  Allergic rhinitis, unspecified seasonality, unspecified  trigger  Uncomplicated asthma, unspecified asthma severity, unspecified whether persistent  Arthritis  Carotid artery disease, unspecified laterality, unspecified type ([place])  Dependent edema  Cerebrovascular accident (CVA) due to other mechanism ([place])  Screening for osteoporosis - Plan: DG Bone Density  Hyperlipidemia, unspecified hyperlipidemia type - Plan: Lipid panel  Estrogen deficiency - Plan: DG Bone Density  Presbycusis of both ears  History of fall Overall she seems to be doing fairly well.  She will continue on her present medication regimen as well as follow-up with cardiology regularly.  I will set up for a DEXA scan.  Medicare Attestation I have personally reviewed: The patient's medical and social history Their use of alcohol, tobacco or illicit drugs Their current medications and supplements The patient's functional ability including ADLs,fall risks, home safety risks, cognitive, and hearing and visual impairment Diet and physical activities Evidence for depression or mood disorders  The patient's weight, height, and BMI have been recorded in the chart.  I have made referrals, counseling, and provided education to the patient based on review of the above and I have provided the patient with a written personalized care plan for preventive services.     Jill Alexanders, MD   11/13/2019

## 2019-11-13 NOTE — Patient Instructions (Signed)
  [name] , Thank you for taking time to come for your Medicare Wellness Visit. I appreciate your ongoing commitment to your health goals. Please review the following plan we discussed and let me know if I can assist you in the future.   These are the goals we discussed: Goals   None     This is a list of the screening recommended for you and due dates:  Health Maintenance  Topic Date Due  . COVID-19 Vaccine (1) Never done  . DEXA scan (bone density measurement)  Never done  . Flu Shot  12/22/2019  . Tetanus Vaccine  11/08/2028  . Pneumonia vaccines  Completed

## 2019-11-13 NOTE — Telephone Encounter (Signed)
Ok. [name]

## 2019-11-13 NOTE — Telephone Encounter (Signed)
Pt request lab results mailed ld 11/13/19

## 2019-11-14 LAB — CBC WITH DIFFERENTIAL/PLATELET
Basophils Absolute: 0 10*3/uL (ref 0.0–0.2)
Basos: 1 %
EOS (ABSOLUTE): 0.1 10*3/uL (ref 0.0–0.4)
Eos: 2 %
Hematocrit: 34.5 % (ref 34.0–46.6)
Hemoglobin: 11.2 g/dL (ref 11.1–15.9)
Immature Grans (Abs): 0 10*3/uL (ref 0.0–0.1)
Immature Granulocytes: 0 %
Lymphocytes Absolute: 1.4 10*3/uL (ref 0.7–3.1)
Lymphs: 21 %
MCH: 30 pg (ref 26.6–33.0)
MCHC: 32.5 g/dL (ref 31.5–35.7)
MCV: 93 fL (ref 79–97)
Monocytes Absolute: 0.5 10*3/uL (ref 0.1–0.9)
Monocytes: 7 %
Neutrophils Absolute: 4.5 10*3/uL (ref 1.4–7.0)
Neutrophils: 69 %
Platelets: 171 10*3/uL (ref 150–450)
RBC: 3.73 x10E6/uL — ABNORMAL LOW (ref 3.77–5.28)
RDW: 12.6 % (ref 11.7–15.4)
WBC: 6.4 10*3/uL (ref 3.4–10.8)

## 2019-11-14 LAB — COMPREHENSIVE METABOLIC PANEL
ALT: 13 IU/L (ref 0–32)
AST: 20 IU/L (ref 0–40)
Albumin/Globulin Ratio: 2.2 (ref 1.2–2.2)
Albumin: 4.4 g/dL (ref 3.6–4.6)
Alkaline Phosphatase: 51 IU/L (ref 48–121)
BUN/Creatinine Ratio: 22 (ref 12–28)
BUN: 11 mg/dL (ref 8–27)
Bilirubin Total: 0.4 mg/dL (ref 0.0–1.2)
CO2: 27 mmol/L (ref 20–29)
Calcium: 9.8 mg/dL (ref 8.7–10.3)
Chloride: 105 mmol/L (ref 96–106)
Creatinine, Ser: 0.5 mg/dL — ABNORMAL LOW (ref 0.57–1.00)
GFR calc Af Amer: 100 mL/min/{1.73_m2} (ref >59)
GFR calc non Af Amer: 87 mL/min/{1.73_m2} (ref >59)
Globulin, Total: 2 g/dL (ref 1.5–4.5)
Glucose: 105 mg/dL — ABNORMAL HIGH (ref 65–99)
Potassium: 4.2 mmol/L (ref 3.5–5.2)
Sodium: 141 mmol/L (ref 134–144)
Total Protein: 6.4 g/dL (ref 6.0–8.5)

## 2019-11-14 LAB — LIPID PANEL
Chol/HDL Ratio: 2 ratio (ref 0.0–4.4)
Cholesterol, Total: 151 mg/dL (ref 100–199)
HDL: 77 mg/dL (ref >39)
LDL Chol Calc (NIH): 58 mg/dL (ref 0–99)
Triglycerides: 84 mg/dL (ref 0–149)
VLDL Cholesterol Cal: 16 mg/dL (ref 5–40)

## 2019-11-18 ENCOUNTER — Other Ambulatory Visit: Payer: Self-pay | Admitting: Family Medicine

## 2019-11-18 DIAGNOSIS — J45909 Unspecified asthma, uncomplicated: Secondary | ICD-10-CM

## 2019-11-18 DIAGNOSIS — K219 Gastro-esophageal reflux disease without esophagitis: Secondary | ICD-10-CM

## 2019-11-18 NOTE — Telephone Encounter (Signed)
[facility] is requesting to fil pt flovent . Please advise [facility]

## 2019-11-21 ENCOUNTER — Telehealth: Payer: Self-pay | Admitting: Family Medicine

## 2019-11-21 NOTE — Telephone Encounter (Signed)
Pt called and states she is having difficulties in finances and she cannot go to any of the appointments you are wanting her to go to.

## 2019-12-16 ENCOUNTER — Other Ambulatory Visit: Payer: Self-pay

## 2019-12-16 MED ORDER — APIXABAN 2.5 MG PO TABS: 2.5000 mg | ORAL_TABLET | Freq: Two times a day (BID) | ORAL | 5 refills | Status: DC

## 2019-12-16 NOTE — Telephone Encounter (Signed)
Eliquis 2.5mg  refill request received. Patient is 86 years old, weight-46.1kg, Crea-0.50 on 11/13/2019, Diagnosis-Afib, and last seen by Dr. Angelena Form on 02/06/2019. Dose is appropriate based on dosing criteria. Will send in refill to requested pharmacy.

## 2019-12-28 ENCOUNTER — Other Ambulatory Visit: Payer: Self-pay | Admitting: Family Medicine

## 2020-01-28 ENCOUNTER — Other Ambulatory Visit: Payer: Self-pay

## 2020-01-28 MED ORDER — ISOSORBIDE DINITRATE 30 MG PO TABS: 30.0000 mg | ORAL_TABLET | Freq: Two times a day (BID) | ORAL | 0 refills | Status: DC

## 2020-01-28 NOTE — Addendum Note (Signed)
Addended by: Carter Kitten D on: 01/28/2020 03:51 PM   Modules accepted: Orders

## 2020-02-06 ENCOUNTER — Telehealth: Payer: Self-pay | Admitting: Cardiovascular Disease

## 2020-02-06 ENCOUNTER — Other Ambulatory Visit: Payer: Self-pay | Admitting: Cardiovascular Disease

## 2020-02-06 NOTE — Telephone Encounter (Signed)
Called pt to inform her that her medication has already been sent to her pharmacy and if she has any other problems, questions or concerns, to give our office a call back. Pt verbalized understanding.

## 2020-02-06 NOTE — Telephone Encounter (Signed)
Pt c/o medication issue:  1. Name of Medication: nitroGLYCERIN (NITROSTAT) 0.4 MG SL tablet  2. How are you currently taking this medication (dosage and times per day)? As written  3. Are you having a reaction (difficulty breathing--STAT)? No  4. What is your medication issue? Pt needs new prescription filled by Dr.

## 2020-02-11 ENCOUNTER — Ambulatory Visit (INDEPENDENT_AMBULATORY_CARE_PROVIDER_SITE_OTHER): Payer: Medicare Other | Admitting: Physician Assistant

## 2020-02-11 ENCOUNTER — Other Ambulatory Visit: Payer: Self-pay | Admitting: Family Medicine

## 2020-02-11 ENCOUNTER — Encounter: Payer: Self-pay | Admitting: Physician Assistant

## 2020-02-11 ENCOUNTER — Other Ambulatory Visit: Payer: Self-pay

## 2020-02-11 VITALS — BP 126/58 | HR 60 | Ht 65.0 in | Wt 100.4 lb

## 2020-02-11 DIAGNOSIS — I480 Paroxysmal atrial fibrillation: Secondary | ICD-10-CM | POA: Diagnosis not present

## 2020-02-11 DIAGNOSIS — I350 Nonrheumatic aortic (valve) stenosis: Secondary | ICD-10-CM | POA: Diagnosis not present

## 2020-02-11 DIAGNOSIS — I10 Essential (primary) hypertension: Secondary | ICD-10-CM | POA: Diagnosis not present

## 2020-02-11 DIAGNOSIS — Z952 Presence of prosthetic heart valve: Secondary | ICD-10-CM | POA: Diagnosis not present

## 2020-02-11 DIAGNOSIS — R0602 Shortness of breath: Secondary | ICD-10-CM

## 2020-02-11 DIAGNOSIS — E782 Mixed hyperlipidemia: Secondary | ICD-10-CM

## 2020-02-11 DIAGNOSIS — I6523 Occlusion and stenosis of bilateral carotid arteries: Secondary | ICD-10-CM

## 2020-02-11 DIAGNOSIS — I25119 Atherosclerotic heart disease of native coronary artery with unspecified angina pectoris: Secondary | ICD-10-CM | POA: Diagnosis not present

## 2020-02-11 NOTE — Patient Instructions (Signed)
Medication Instructions:  Your physician recommends that you continue on your current medications as directed. Please refer to the Current Medication list given to you today.  *If you need a refill on your cardiac medications before your next appointment, please call your pharmacy*  Lab Work: None ordered today  Testing/Procedures: Your physician has requested that you have an echocardiogram. Echocardiography is a painless test that uses sound waves to create images of your heart. It provides your doctor with information about the size and shape of your heart and how well your heart's chambers and valves are working. This procedure takes approximately one hour. There are no restrictions for this procedure.  Follow-Up: At [facility], you and your health needs are our priority.  As part of our continuing mission to provide you with exceptional heart care, we have created designated Provider Care Teams.  These Care Teams include your primary Cardiologist (physician) and Advanced Practice Providers (APPs -  Physician Assistants and Nurse Practitioners) who all work together to provide you with the care you need, when you need it.  Your next appointment:   12 month(s)  The format for your next appointment:   In Person  Provider:   Lauree Chandler, MD

## 2020-02-11 NOTE — Progress Notes (Signed)
Cardiology Office Note:    Date:  02/11/2020   ID:  [name], DOB Jul 20, 1928, MRN [id number]  PCP:  Denita Lung, MD  [facility] Cardiologist:  Lauree Chandler, MD   [facility] Electrophysiologist:  None   Referring MD: Denita Lung, MD   Chief Complaint:  Follow-up (CAD, A. fib, valvular heart disease)    Patient Profile:    [name] is a 86 y.o. female with:   Coronary artery disease   S/p CABG, Maze procedure in 2006  Cath in 6/18: 3/4 grafts patent  Atrial fibrillation  S/p Maze in 2006  Apixaban   Hx of CVA  Carotid artery disease s/p R CEA   Severe AS s/p TAVR in 12/2016   Mitral regurgitation  Echo 8/19: Mild MR  Hypertension   Hyperlipidemia   2nd degree AVB  Seen by EP (Dr. Curt Bears), [place] ? to Metoprolol   Intermittent LBBB   GERD  Fibromyalgia   Asthma   Prior CV studies: Carotid US 11/11/2019 Bilateral ICA 1-39  Echocardiogram 12/28/2017 EF 60-65, no RWMA, GRII DD, normally functioning TAVR without stenosis or regurgitation, mild MR, normal RVSF, mild TR, PASP 38  48-hour Holter monitor 9/18 Sinus bradycardia with lowest heart rate 48 bpm Premature ventricular contractions Premature atrial contractions Transient second degree AV block.  Cardiac catheterization 10/27/2016 LAD mid 100, distal 40; D1 50 LCx ostial 50; OM2 stent patent RCA occluded SVG-OM2 stent patent SVG-RPDA patent SVG-mid LAD patent LIMA-D1 atretic      History of Present Illness:    Ms. [name] was last seen by Dr. Angelena Form in 01/2019.  She returns for follow up.  She is here with her daughter.  She has occasional anginal symptoms.  She takes nitroglycerin with relief.  She has had this for quite some time without change.  She does feel like her breathing is getting worse.  She is fairly sedentary.  She sleeps on 2 pillows chronically.  She has not had significant leg swelling.  She has not had syncope.      Past Medical History:    Diagnosis Date  . Anxiety   . Arthritis    OSTEO  . Aspirin allergy    on Plavix  . Asthma   . Bronchitis   . CAD (coronary artery disease)    a. s/p CABG 2006 with Cox-Maze procedure.  . Carotid artery disease ([place])    a. s/p R CEA.  . Chronic stable angina ([place])   . Diastolic dysfunction   . Eczema   . Fibromyalgia   . GERD (gastroesophageal reflux disease)   . Heart murmur   . Hiatal hernia   . Hyperlipidemia   . Hypertension   . Kyphoscoliosis   . Mitral regurgitation   . Myocardial infarction [facility]) 2003   Stent to CFX  . PAF (paroxysmal atrial fibrillation) ([place])    a. pt has h/o hematuria on Eliquis and has since refused anticoagulation  . Pneumonia 2015 ?  Marland Kitchen Pulmonary regurgitation   . Severe aortic stenosis   . Skin cancer of arm   . Stroke ([place])   . Tricuspid regurgitation     Current Medications: Current Meds  Medication Sig  . acetaminophen (TYLENOL) 650 MG CR tablet Take 1,300 mg by mouth 2 (two) times daily as needed for pain.   Marland Kitchen apixaban (ELIQUIS) 2.5 MG TABS tablet Take 1 tablet (2.5 mg total) by mouth 2 (two) times daily.  . calcium-vitamin D (OSCAL WITH  D) 500-200 MG-UNIT per tablet Take 1 tablet by mouth daily with breakfast.  . cholecalciferol (VITAMIN D) 1000 UNITS tablet Take 1,000 Units by mouth daily.  . diazepam (VALIUM) 2 MG tablet Take 1 tablet (2 mg total) by mouth every 6 (six) hours as needed for anxiety.  Marland Kitchen FLOVENT HFA 220 MCG/ACT inhaler INHALE 2 PUFFS INTO THE LUNGS TWICE DAILY  . fluticasone (FLONASE) 50 MCG/ACT nasal spray SHAKE LIQUID AND USE 2 SPRAYS IN EACH NOSTRIL EVERY DAY  . guaiFENesin (MUCINEX) 600 MG 12 hr tablet Take 1 tablet (600 mg total) by mouth 2 (two) times daily.  Marland Kitchen guaiFENesin (ROBITUSSIN) 100 MG/5ML liquid Take 100 mg by mouth 3 (three) times daily as needed for cough. Reported on 12/08/2015  . irbesartan (AVAPRO) 75 MG tablet Take 1 tablet (75 mg total) by mouth daily.  . isosorbide dinitrate (ISORDIL) 30 MG  tablet Take 1 tablet (30 mg total) by mouth 2 (two) times daily. Please keep upcoming appt in September with Dr. Angelena Form before anymore refills. Thank you  . loratadine (CLARITIN) 10 MG tablet Take 10 mg by mouth daily.    . metoprolol tartrate (LOPRESSOR) 25 MG tablet TAKE 1/2 TABLET BY MOUTH TWICE DAILY  . nitroGLYCERIN (NITROSTAT) 0.4 MG SL tablet PLACE 1 TABLET UNDER THE TONGUE EVERY 5 MINUTESAS NEEDED FOR CHEST PAIN. Please keep upcoming appt in September before anymore refills. Thank  You  . pantoprazole (PROTONIX) 40 MG tablet TAKE 1 TABLET BY MOUTH TWICE DAILY  . rosuvastatin (CRESTOR) 40 MG tablet TAKE 1 TABLET BY MOUTH EVERY DAY  . VENTOLIN HFA 108 (90 Base) MCG/ACT inhaler INHALE 2 PUFFS BY MOUTH EVERY 6 HOURS AS NEEDED FOR WHEEZING OR SHORTNESS OF BREATH     Allergies:   Bactrim [sulfamethoxazole-trimethoprim], Amitriptyline hcl, Aspirin, and Zetia [ezetimibe]   Social History   Tobacco Use  . Smoking status: Never Smoker  . Smokeless tobacco: Former Systems developer    Types: Snuff  Vaping Use  . Vaping Use: Never used  Substance Use Topics  . Alcohol use: No  . Drug use: No     Family Hx: The patient's family history includes Heart failure in her mother; Other in her father.  ROS   EKGs/Labs/Other Test Reviewed:    EKG:  EKG is  ordered today.  The ekg ordered today demonstrates normal sinus rhythm, heart rate 60, normal axis, first-degree AV block, PR interval 236 ms, PAC, no acute ST-T wave changes, QTC 414  Recent Labs: 11/13/2019: ALT 13; BUN 11; Creatinine, Ser 0.50; Hemoglobin 11.2; Platelets 171; Potassium 4.2; Sodium 141   Recent Lipid Panel Lab Results  Component Value Date/Time   CHOL 151 11/13/2019 03:29 PM   TRIG 84 11/13/2019 03:29 PM   HDL 77 11/13/2019 03:29 PM   CHOLHDL 2.0 11/13/2019 03:29 PM   CHOLHDL 2.0 08/21/2015 12:01 AM   LDLCALC 58 11/13/2019 03:29 PM    Physical Exam:    VS:  BP (!) 126/58   Pulse 60   Ht 5\' 5"  (1.651 m)   Wt 100 lb 6.4  oz (45.5 kg)   SpO2 96%   BMI 16.71 kg/m     Wt Readings from Last 3 Encounters:  02/11/20 100 lb 6.4 oz (45.5 kg)  11/13/19 101 lb 9.6 oz (46.1 kg)  02/06/19 111 lb (50.3 kg)     Constitutional:      Appearance: Healthy appearance. Not in distress.  Pulmonary:     Effort: Pulmonary effort is normal.  Breath sounds: No wheezing. No rales.  Cardiovascular:     Normal rate. Regular rhythm. Normal S1. Normal S2.     Murmurs: There is a grade 2/6 systolic murmur at the URSB and ULSB.  Edema:    Peripheral edema absent.  Abdominal:     Palpations: Abdomen is soft.  Musculoskeletal:     Cervical back: Neck supple. Skin:    General: Skin is warm and dry.  Neurological:     Mental Status: Alert and oriented to person, place and time.     Cranial Nerves: Cranial nerves are intact.       ASSESSMENT & PLAN:    1. Coronary artery disease involving native coronary artery of native heart with angina pectoris ([place]) History of CABG in 2006.  She has fairly stable anginal symptoms.  She takes as needed nitroglycerin with relief.  She is not on aspirin as she is on Apixaban.  Continue current dose of Isordil, metoprolol tartrate, rosuvastatin.  2. Severe aortic stenosis 3. S/P TAVR (transcatheter aortic valve replacement) 4. Shortness of breath Status post TAVR in 2018.  Most recent echocardiogram in 2019 demonstrated normally functioning aortic valve replacement and mild much regurgitation with normal LV function.  She has noted shortness of breath.  I suspect her shortness of breath is multifactorial.  She does have a murmur on exam.  Since it has been 2 years since her last echocardiogram, I will repeat an echocardiogram to reassess her LV function, RV function and reassess her valves.  5. PAF (paroxysmal atrial fibrillation) ([facility]) Maintaining sinus rhythm.  She seems to be tolerating anticoagulation.  Most recent creatinine and hemoglobin stable.  She is on low-dose Apixaban due to  her age and weight.  6. Bilateral carotid artery stenosis History of right CEA.  Carotid US in June 2021 with bilateral ICA stenosis 1-39%.  7. Essential hypertension The patient's blood pressure is controlled on her current regimen.  Continue current therapy.   8. Mixed hyperlipidemia LDL optimal on most recent lab work.  Continue current Rx.      Dispo:  Return in about 1 year (around 02/10/2021) for Routine Follow Up with Dr. Angelena Form, in person.   Medication Adjustments/Labs and Tests Ordered: Current medicines are reviewed at length with the patient today.  Concerns regarding medicines are outlined above.  Tests Ordered: Orders Placed This Encounter  Procedures  . EKG 12-Lead  . ECHOCARDIOGRAM COMPLETE   Medication Changes: No orders of the defined types were placed in this encounter.   Signed, Richardson Dopp, PA-C  02/11/2020 4:35 PM    [facility] [address], [place], [place]  [address] Phone: [phone]; Fax: [phone]

## 2020-02-11 NOTE — Telephone Encounter (Signed)
[facility] is requesting to fill pt ventolin.Please advise [facility]

## 2020-02-26 ENCOUNTER — Ambulatory Visit (HOSPITAL_COMMUNITY): Payer: Medicare Other | Attending: Cardiovascular Disease

## 2020-02-26 ENCOUNTER — Other Ambulatory Visit: Payer: Self-pay

## 2020-02-26 DIAGNOSIS — R0602 Shortness of breath: Secondary | ICD-10-CM | POA: Diagnosis not present

## 2020-02-26 DIAGNOSIS — Z952 Presence of prosthetic heart valve: Secondary | ICD-10-CM | POA: Diagnosis not present

## 2020-02-26 DIAGNOSIS — Z23 Encounter for immunization: Secondary | ICD-10-CM | POA: Diagnosis not present

## 2020-02-26 LAB — ECHOCARDIOGRAM COMPLETE
AR max vel: 1.78 cm2
AV Area VTI: 1.88 cm2
AV Area mean vel: 1.85 cm2
AV Mean grad: 8.4 mmHg
AV Peak grad: 18.1 mmHg
Ao pk vel: 2.13 m/s
Area-P 1/2: 2.61 cm2
S' Lateral: 2.2 cm

## 2020-02-27 ENCOUNTER — Encounter: Payer: Self-pay | Admitting: Physician Assistant

## 2020-03-06 ENCOUNTER — Other Ambulatory Visit: Payer: Self-pay | Admitting: Cardiovascular Disease

## 2020-03-06 ENCOUNTER — Other Ambulatory Visit: Payer: Self-pay | Admitting: Family Medicine

## 2020-03-06 DIAGNOSIS — K219 Gastro-esophageal reflux disease without esophagitis: Secondary | ICD-10-CM

## 2020-03-09 ENCOUNTER — Other Ambulatory Visit: Payer: Self-pay | Admitting: Cardiovascular Disease

## 2020-03-17 ENCOUNTER — Other Ambulatory Visit: Payer: Self-pay | Admitting: Family Medicine

## 2020-03-25 ENCOUNTER — Encounter: Payer: Self-pay | Admitting: Family Medicine

## 2020-03-25 ENCOUNTER — Other Ambulatory Visit: Payer: Self-pay

## 2020-03-25 ENCOUNTER — Ambulatory Visit (INDEPENDENT_AMBULATORY_CARE_PROVIDER_SITE_OTHER): Payer: Medicare Other | Admitting: Family Medicine

## 2020-03-25 VITALS — BP 98/64 | HR 60 | Temp 98.9°F | Wt 101.0 lb

## 2020-03-25 DIAGNOSIS — I6523 Occlusion and stenosis of bilateral carotid arteries: Secondary | ICD-10-CM | POA: Diagnosis not present

## 2020-03-25 DIAGNOSIS — M79675 Pain in left toe(s): Secondary | ICD-10-CM | POA: Diagnosis not present

## 2020-03-25 NOTE — Progress Notes (Signed)
   Subjective:    Patient ID: [name], female    DOB: Nov 22, 1928, 86 y.o.   MRN: [id number]  HPI She is here for evaluation of possible ingrown left great toenail.  Apparently she has had difficulty with this in the past.  She has noted slight erythema and swelling to the tip of the left great toe over the last week or so.  Difficult to get a good history from her.   Review of Systems     Objective:   Physical Exam Alert and in no distress.  Exam of the foot and especially the left great toe does show slight erythema to the very tip of the toe with no evidence of swelling or trauma to the nail although the does look like this is from work on in the past.       Assessment & Plan:  Pain of toe of left foot Discussed conservative care at the present time.  The daughter did take a picture of it and will keep me informed over the next couple of days.  If it gets worse, I will place her on an antibiotic do not feel the need to at present time.

## 2020-03-28 ENCOUNTER — Other Ambulatory Visit: Payer: Self-pay | Admitting: Family Medicine

## 2020-04-01 ENCOUNTER — Telehealth: Payer: Self-pay

## 2020-04-01 MED ORDER — DOXYCYCLINE HYCLATE 100 MG PO TABS: 100.0000 mg | ORAL_TABLET | Freq: Two times a day (BID) | ORAL | 0 refills | Status: DC

## 2020-04-01 NOTE — Telephone Encounter (Signed)
Pt. Daughter [name] called back about her mom's toes stating they were still not getting better. She said in the e-mail that they are aching and redder in color.

## 2020-04-01 NOTE — Telephone Encounter (Signed)
Let her know that I am calling in an antibiotic

## 2020-04-02 NOTE — Telephone Encounter (Signed)
Called pt. LM to inform medicine has been sent in.

## 2020-04-22 ENCOUNTER — Encounter (HOSPITAL_COMMUNITY): Payer: Self-pay

## 2020-04-22 ENCOUNTER — Other Ambulatory Visit: Payer: Self-pay

## 2020-04-22 ENCOUNTER — Telehealth: Payer: Self-pay | Admitting: Family Medicine

## 2020-04-22 ENCOUNTER — Emergency Department (HOSPITAL_COMMUNITY): Payer: Medicare Other

## 2020-04-22 ENCOUNTER — Inpatient Hospital Stay (HOSPITAL_COMMUNITY)
Admission: EM | Admit: 2020-04-22 | Discharge: 2020-04-27 | DRG: 378 | Disposition: A | Discharge status: Home Health | Payer: Medicare Other | Attending: Student | Admitting: Student

## 2020-04-22 DIAGNOSIS — K219 Gastro-esophageal reflux disease without esophagitis: Secondary | ICD-10-CM | POA: Diagnosis present

## 2020-04-22 DIAGNOSIS — R5381 Other malaise: Secondary | ICD-10-CM | POA: Diagnosis not present

## 2020-04-22 DIAGNOSIS — Z87891 Personal history of nicotine dependence: Secondary | ICD-10-CM

## 2020-04-22 DIAGNOSIS — E162 Hypoglycemia, unspecified: Secondary | ICD-10-CM | POA: Diagnosis not present

## 2020-04-22 DIAGNOSIS — Y9302 Activity, running: Secondary | ICD-10-CM | POA: Diagnosis present

## 2020-04-22 DIAGNOSIS — E785 Hyperlipidemia, unspecified: Secondary | ICD-10-CM | POA: Diagnosis present

## 2020-04-22 DIAGNOSIS — Z20822 Contact with and (suspected) exposure to covid-19: Secondary | ICD-10-CM | POA: Diagnosis present

## 2020-04-22 DIAGNOSIS — I2510 Atherosclerotic heart disease of native coronary artery without angina pectoris: Secondary | ICD-10-CM | POA: Diagnosis present

## 2020-04-22 DIAGNOSIS — E861 Hypovolemia: Secondary | ICD-10-CM | POA: Diagnosis present

## 2020-04-22 DIAGNOSIS — I208 Other forms of angina pectoris: Secondary | ICD-10-CM | POA: Diagnosis present

## 2020-04-22 DIAGNOSIS — I081 Rheumatic disorders of both mitral and tricuspid valves: Secondary | ICD-10-CM | POA: Diagnosis present

## 2020-04-22 DIAGNOSIS — J9690 Respiratory failure, unspecified, unspecified whether with hypoxia or hypercapnia: Secondary | ICD-10-CM | POA: Diagnosis not present

## 2020-04-22 DIAGNOSIS — W19XXXA Unspecified fall, initial encounter: Secondary | ICD-10-CM | POA: Diagnosis present

## 2020-04-22 DIAGNOSIS — M1990 Unspecified osteoarthritis, unspecified site: Secondary | ICD-10-CM | POA: Diagnosis present

## 2020-04-22 DIAGNOSIS — K5733 Diverticulitis of large intestine without perforation or abscess with bleeding: Principal | ICD-10-CM | POA: Diagnosis present

## 2020-04-22 DIAGNOSIS — J45909 Unspecified asthma, uncomplicated: Secondary | ICD-10-CM | POA: Diagnosis present

## 2020-04-22 DIAGNOSIS — Z8673 Personal history of transient ischemic attack (TIA), and cerebral infarction without residual deficits: Secondary | ICD-10-CM | POA: Diagnosis not present

## 2020-04-22 DIAGNOSIS — R21 Rash and other nonspecific skin eruption: Secondary | ICD-10-CM | POA: Diagnosis not present

## 2020-04-22 DIAGNOSIS — Z952 Presence of prosthetic heart valve: Secondary | ICD-10-CM

## 2020-04-22 DIAGNOSIS — D696 Thrombocytopenia, unspecified: Secondary | ICD-10-CM | POA: Diagnosis not present

## 2020-04-22 DIAGNOSIS — K625 Hemorrhage of anus and rectum: Secondary | ICD-10-CM | POA: Diagnosis not present

## 2020-04-22 DIAGNOSIS — Z90710 Acquired absence of both cervix and uterus: Secondary | ICD-10-CM

## 2020-04-22 DIAGNOSIS — I9589 Other hypotension: Secondary | ICD-10-CM | POA: Diagnosis not present

## 2020-04-22 DIAGNOSIS — I371 Nonrheumatic pulmonary valve insufficiency: Secondary | ICD-10-CM | POA: Diagnosis present

## 2020-04-22 DIAGNOSIS — F419 Anxiety disorder, unspecified: Secondary | ICD-10-CM | POA: Diagnosis present

## 2020-04-22 DIAGNOSIS — M419 Scoliosis, unspecified: Secondary | ICD-10-CM | POA: Diagnosis present

## 2020-04-22 DIAGNOSIS — Z79899 Other long term (current) drug therapy: Secondary | ICD-10-CM

## 2020-04-22 DIAGNOSIS — D62 Acute posthemorrhagic anemia: Secondary | ICD-10-CM | POA: Diagnosis present

## 2020-04-22 DIAGNOSIS — Z7901 Long term (current) use of anticoagulants: Secondary | ICD-10-CM

## 2020-04-22 DIAGNOSIS — K922 Gastrointestinal hemorrhage, unspecified: Secondary | ICD-10-CM | POA: Diagnosis not present

## 2020-04-22 DIAGNOSIS — E876 Hypokalemia: Secondary | ICD-10-CM | POA: Diagnosis present

## 2020-04-22 DIAGNOSIS — Z720 Tobacco use: Secondary | ICD-10-CM

## 2020-04-22 DIAGNOSIS — Z881 Allergy status to other antibiotic agents status: Secondary | ICD-10-CM

## 2020-04-22 DIAGNOSIS — I252 Old myocardial infarction: Secondary | ICD-10-CM

## 2020-04-22 DIAGNOSIS — Z85828 Personal history of other malignant neoplasm of skin: Secondary | ICD-10-CM

## 2020-04-22 DIAGNOSIS — Z888 Allergy status to other drugs, medicaments and biological substances status: Secondary | ICD-10-CM

## 2020-04-22 DIAGNOSIS — I4891 Unspecified atrial fibrillation: Secondary | ICD-10-CM | POA: Diagnosis not present

## 2020-04-22 DIAGNOSIS — N179 Acute kidney failure, unspecified: Secondary | ICD-10-CM | POA: Insufficient documentation

## 2020-04-22 DIAGNOSIS — J90 Pleural effusion, not elsewhere classified: Secondary | ICD-10-CM | POA: Diagnosis not present

## 2020-04-22 DIAGNOSIS — D500 Iron deficiency anemia secondary to blood loss (chronic): Secondary | ICD-10-CM | POA: Diagnosis not present

## 2020-04-22 DIAGNOSIS — I110 Hypertensive heart disease with heart failure: Secondary | ICD-10-CM | POA: Diagnosis present

## 2020-04-22 DIAGNOSIS — M797 Fibromyalgia: Secondary | ICD-10-CM | POA: Diagnosis present

## 2020-04-22 DIAGNOSIS — Z951 Presence of aortocoronary bypass graft: Secondary | ICD-10-CM

## 2020-04-22 DIAGNOSIS — L89159 Pressure ulcer of sacral region, unspecified stage: Secondary | ICD-10-CM | POA: Diagnosis present

## 2020-04-22 DIAGNOSIS — I10 Essential (primary) hypertension: Secondary | ICD-10-CM | POA: Diagnosis not present

## 2020-04-22 DIAGNOSIS — Z955 Presence of coronary angioplasty implant and graft: Secondary | ICD-10-CM

## 2020-04-22 DIAGNOSIS — I959 Hypotension, unspecified: Secondary | ICD-10-CM | POA: Diagnosis not present

## 2020-04-22 DIAGNOSIS — I480 Paroxysmal atrial fibrillation: Secondary | ICD-10-CM | POA: Diagnosis present

## 2020-04-22 DIAGNOSIS — R011 Cardiac murmur, unspecified: Secondary | ICD-10-CM | POA: Diagnosis present

## 2020-04-22 DIAGNOSIS — K921 Melena: Secondary | ICD-10-CM | POA: Insufficient documentation

## 2020-04-22 DIAGNOSIS — Z886 Allergy status to analgesic agent status: Secondary | ICD-10-CM

## 2020-04-22 DIAGNOSIS — R636 Underweight: Secondary | ICD-10-CM | POA: Diagnosis not present

## 2020-04-22 DIAGNOSIS — Z86007 Personal history of in-situ neoplasm of skin: Secondary | ICD-10-CM

## 2020-04-22 DIAGNOSIS — R531 Weakness: Secondary | ICD-10-CM | POA: Diagnosis not present

## 2020-04-22 DIAGNOSIS — Z8701 Personal history of pneumonia (recurrent): Secondary | ICD-10-CM

## 2020-04-22 DIAGNOSIS — N200 Calculus of kidney: Secondary | ICD-10-CM | POA: Diagnosis not present

## 2020-04-22 DIAGNOSIS — I952 Hypotension due to drugs: Secondary | ICD-10-CM | POA: Diagnosis not present

## 2020-04-22 DIAGNOSIS — K045 Chronic apical periodontitis: Secondary | ICD-10-CM | POA: Diagnosis present

## 2020-04-22 DIAGNOSIS — Z8249 Family history of ischemic heart disease and other diseases of the circulatory system: Secondary | ICD-10-CM

## 2020-04-22 DIAGNOSIS — R579 Shock, unspecified: Secondary | ICD-10-CM | POA: Diagnosis present

## 2020-04-22 DIAGNOSIS — J9811 Atelectasis: Secondary | ICD-10-CM | POA: Diagnosis not present

## 2020-04-22 DIAGNOSIS — I509 Heart failure, unspecified: Secondary | ICD-10-CM | POA: Diagnosis present

## 2020-04-22 DIAGNOSIS — R933 Abnormal findings on diagnostic imaging of other parts of digestive tract: Secondary | ICD-10-CM | POA: Diagnosis not present

## 2020-04-22 LAB — CBC
HCT: 32.8 % — ABNORMAL LOW (ref 36.0–46.0)
Hemoglobin: 11.3 g/dL — ABNORMAL LOW (ref 12.0–15.0)
MCH: 30.7 pg (ref 26.0–34.0)
MCHC: 34.5 g/dL (ref 30.0–36.0)
MCV: 89.1 fL (ref 80.0–100.0)
Platelets: 80 10*3/uL — ABNORMAL LOW (ref 150–400)
RBC: 3.68 MIL/uL — ABNORMAL LOW (ref 3.87–5.11)
RDW: 13.4 % (ref 11.5–15.5)
WBC: 10.1 10*3/uL (ref 4.0–10.5)
nRBC: 0 % (ref 0.0–0.2)

## 2020-04-22 LAB — PREPARE RBC (CROSSMATCH)

## 2020-04-22 LAB — COMPREHENSIVE METABOLIC PANEL
ALT: 17 U/L (ref 0–44)
AST: 26 U/L (ref 15–41)
Albumin: 3.1 g/dL — ABNORMAL LOW (ref 3.5–5.0)
Alkaline Phosphatase: 62 U/L (ref 38–126)
Anion gap: 11 (ref 5–15)
BUN: 20 mg/dL (ref 8–23)
CO2: 22 mmol/L (ref 22–32)
Calcium: 8.8 mg/dL — ABNORMAL LOW (ref 8.9–10.3)
Chloride: 102 mmol/L (ref 98–111)
Creatinine, Ser: 1.25 mg/dL — ABNORMAL HIGH (ref 0.44–1.00)
GFR, Estimated: 41 mL/min — ABNORMAL LOW (ref >60)
Glucose, Bld: 112 mg/dL — ABNORMAL HIGH (ref 70–99)
Potassium: 3.2 mmol/L — ABNORMAL LOW (ref 3.5–5.1)
Sodium: 135 mmol/L (ref 135–145)
Total Bilirubin: 0.7 mg/dL (ref 0.3–1.2)
Total Protein: 5.2 g/dL — ABNORMAL LOW (ref 6.5–8.1)

## 2020-04-22 LAB — I-STAT CHEM 8, ED
BUN: 23 mg/dL (ref 8–23)
Calcium, Ion: 1.22 mmol/L (ref 1.15–1.40)
Chloride: 101 mmol/L (ref 98–111)
Creatinine, Ser: 1.2 mg/dL — ABNORMAL HIGH (ref 0.44–1.00)
Glucose, Bld: 103 mg/dL — ABNORMAL HIGH (ref 70–99)
HCT: 33 % — ABNORMAL LOW (ref 36.0–46.0)
Hemoglobin: 11.2 g/dL — ABNORMAL LOW (ref 12.0–15.0)
Potassium: 3.2 mmol/L — ABNORMAL LOW (ref 3.5–5.1)
Sodium: 135 mmol/L (ref 135–145)
TCO2: 22 mmol/L (ref 22–32)

## 2020-04-22 LAB — CBC WITH DIFFERENTIAL/PLATELET
Abs Immature Granulocytes: 0.08 10*3/uL — ABNORMAL HIGH (ref 0.00–0.07)
Basophils Absolute: 0 10*3/uL (ref 0.0–0.1)
Basophils Relative: 0 %
Eosinophils Absolute: 0 10*3/uL (ref 0.0–0.5)
Eosinophils Relative: 0 %
HCT: 33.7 % — ABNORMAL LOW (ref 36.0–46.0)
Hemoglobin: 11.1 g/dL — ABNORMAL LOW (ref 12.0–15.0)
Immature Granulocytes: 1 %
Lymphocytes Relative: 12 %
Lymphs Abs: 1.4 10*3/uL (ref 0.7–4.0)
MCH: 30.1 pg (ref 26.0–34.0)
MCHC: 32.9 g/dL (ref 30.0–36.0)
MCV: 91.3 fL (ref 80.0–100.0)
Monocytes Absolute: 0.8 10*3/uL (ref 0.1–1.0)
Monocytes Relative: 7 %
Neutro Abs: 9.6 10*3/uL — ABNORMAL HIGH (ref 1.7–7.7)
Neutrophils Relative %: 80 %
Platelets: UNDETERMINED 10*3/uL (ref 150–400)
RBC: 3.69 MIL/uL — ABNORMAL LOW (ref 3.87–5.11)
RDW: 13.6 % (ref 11.5–15.5)
WBC: 11.9 10*3/uL — ABNORMAL HIGH (ref 4.0–10.5)
nRBC: 0 % (ref 0.0–0.2)

## 2020-04-22 LAB — PROTIME-INR
INR: 1.6 — ABNORMAL HIGH (ref 0.8–1.2)
Prothrombin Time: 18.2 seconds — ABNORMAL HIGH (ref 11.4–15.2)

## 2020-04-22 LAB — CBG MONITORING, ED: Glucose-Capillary: 107 mg/dL — ABNORMAL HIGH (ref 70–99)

## 2020-04-22 LAB — LIPASE, BLOOD: Lipase: 22 U/L (ref 11–51)

## 2020-04-22 LAB — RESP PANEL BY RT-PCR (FLU A&B, COVID) ARPGX2
Influenza A by PCR: NEGATIVE
Influenza B by PCR: NEGATIVE
SARS Coronavirus 2 by RT PCR: NEGATIVE

## 2020-04-22 LAB — TROPONIN I (HIGH SENSITIVITY): Troponin I (High Sensitivity): 51 ng/L — ABNORMAL HIGH (ref <18)

## 2020-04-22 LAB — LACTIC ACID, PLASMA
Lactic Acid, Venous: 1.6 mmol/L (ref 0.5–1.9)
Lactic Acid, Venous: 1.3 mmol/L (ref 0.5–1.9)

## 2020-04-22 LAB — POC OCCULT BLOOD, ED: Fecal Occult Bld: POSITIVE — AB

## 2020-04-22 MED ORDER — ALBUTEROL SULFATE HFA 108 (90 BASE) MCG/ACT IN AERS
1.0000 | INHALATION_SPRAY | Freq: Four times a day (QID) | RESPIRATORY_TRACT | Status: DC | PRN
  Filled 2020-04-22: qty 6.7

## 2020-04-22 MED ORDER — POTASSIUM CHLORIDE CRYS ER 20 MEQ PO TBCR
40.0000 meq | EXTENDED_RELEASE_TABLET | Freq: Once | ORAL | Status: AC
  Administered 2020-04-22: 40 meq via ORAL
  Filled 2020-04-22: qty 2

## 2020-04-22 MED ORDER — SODIUM CHLORIDE 0.9 % IV SOLN: 10.0000 mL/h | Freq: Once | INTRAVENOUS | Status: DC

## 2020-04-22 MED ORDER — BUDESONIDE 0.25 MG/2ML IN SUSP
0.2500 mg | Freq: Two times a day (BID) | RESPIRATORY_TRACT | Status: DC
  Administered 2020-04-22: 0.25 mg via RESPIRATORY_TRACT
  Filled 2020-04-22: qty 2

## 2020-04-22 MED ORDER — EMPTY CONTAINERS FLEXIBLE MISC: 900.0000 mg | Freq: Once | Status: DC

## 2020-04-22 MED ORDER — PROTHROMBIN COMPLEX CONC HUMAN 500 UNITS IV KIT
2170.0000 [IU] | PACK | Status: AC
  Administered 2020-04-22: 2170 [IU] via INTRAVENOUS
  Filled 2020-04-22: qty 2170

## 2020-04-22 MED ORDER — IOHEXOL 350 MG/ML SOLN
75.0000 mL | Freq: Once | INTRAVENOUS | Status: AC | PRN
  Administered 2020-04-22: 75 mL via INTRAVENOUS

## 2020-04-22 MED ORDER — LACTATED RINGERS IV BOLUS
1000.0000 mL | Freq: Once | INTRAVENOUS | Status: AC
  Administered 2020-04-22: 1000 mL via INTRAVENOUS

## 2020-04-22 MED ORDER — SODIUM CHLORIDE 0.9 % IV SOLN
10.0000 mL/h | Freq: Once | INTRAVENOUS | Status: AC
  Administered 2020-04-22: 10 mL/h via INTRAVENOUS

## 2020-04-22 NOTE — ED Notes (Signed)
Dr. Ronnald Nian aware of BP and at bedside with family.

## 2020-04-22 NOTE — Progress Notes (Signed)
Pharmacy recommendation Note: - I spoke with Dr. Chase Caller regarding the reversal of this patient's anticoagulation in the setting of a LGI bleed. Patient takes Apixaban at home.  - With potential IR procedure as well as being a LGI bleed I recommended the use of Kcentra over Tropic for this patient.  - Andexxa per hospital policy is contraindicated if plans for surgery in 12 hours - Also with the original Andexxa trial there is a potential risk for a rebound in Xa level above placebo after stopping the Andexxa infusion.   Plan:  - We agreered to proceed with the use of Kcentra 2170 units IV x [place] PharmD. BCPS

## 2020-04-22 NOTE — ED Notes (Signed)
Pt Hgb is 11.2 prior to first blood transfusion; provider says he wants a second emergency transfusion due to low BP.

## 2020-04-22 NOTE — Telephone Encounter (Signed)
Pt's daughter called with pt on speaker phone. She states she is extremely weak and can't walk. She states she been throwing up and not eating or drinking. Pt was advised to send pt to ER.

## 2020-04-22 NOTE — ED Notes (Signed)
Emergency release blood given now per Dr. Ronnald Nian - Verified with Jerel Shepherd RN

## 2020-04-22 NOTE — ED Triage Notes (Signed)
Per [facility]: Pt from home, lives with daughter. Complaints of weakness X 3 days, not able to ambulate today. Normally uses a walker. Initial BP 68/40. Pt has not been eating or drinking for the last 3 days. Pt has baseline stutter. Pt was given 500 ml saline with EMS.  20 g IV R AC

## 2020-04-22 NOTE — H&P (Signed)
NAME:  AROUSH CHASSE, MRN:  [id number], DOB:  11-10-1928, LOS: 0 ADMISSION DATE:  04/22/2020, CONSULTATION DATE:  04/22/20 REFERRING MD:  Ronnald Nian  CHIEF COMPLAINT:  GI Bleed   Brief History   [name] is a 86 y.o. female who was admitted 12/1 with hypotension presumed 2/2 lower GI bleed.  History of present illness   [name] is a 86 y.o. female who has a PMH as outlined below.  She presented to [facility] ED 12/1 with generalized weakness and not feeling well.  Pt and daughter felt that she was dehydrated.  She had not been feeling well for 3 days and per daughter, had not been eating and drinking normally.  On day of presentation, she also had large bright bloody bowel movement in bedside commode.  She was brought to ED were FOB was positive.  Initial SBP's were in the 70s and 60's range.  She was treated with emergency release blood and as blood was running, BP would improve to 90s then quickly fall back to 60s.  She had 3u PRBC while in ED despite Hgb 11.2 due to her clinical picture (shocky vitals and pale appearance).   She denied any lightheadedness, chest pain, SOB, N/V/D, abd pain, myalgias.  Denies any excessive use of aspirin or NSAIDS.  Does take daily apixaban.  Daughter states she did take all of her meds earlier that morning (cardiac meds include apixaban, irbesartan, isosorbide dinitrate, lopressor).  Case discussed with Dr. Collene Mares of GI who recommended CTA before deciding on any GI interventions.  Due to BP dipping into the 60's, PCCM called for admission.  Past Medical History  has Asthma; Allergic rhinitis; Hyperlipidemia; Hx of CABG 2006 with [place]; Mitral regurgitation; Gastroesophageal reflux disease without esophagitis; PAF (paroxysmal atrial fibrillation) ([place]); History of allergy to aspirin; Dependent edema; Bowen's disease; Back pain-similar to pt's angina; Carotid artery disease- s/p RCE; Left shoulder pain; Diastolic dysfunction; Chronic stable angina ([place]); Tricuspid  regurgitation; Pulmonary regurgitation; Chronic apical periodontitis; Retained dental roots; Bilateral maxillary lateral exostoses; Status post transcatheter aortic valve replacement (TAVR) using bioprosthesis; Stroke ([place]); Vertigo; Arthritis; Presbycusis of both ears; and GI bleed on their problem list.  Significant Hospital Events   12/1 > admit.  Consults:  GI by EDP.  Procedures:  None.  Significant Diagnostic Tests:  CTA abd / pelv 12/1 >  Micro Data:  COVID 12/1 > neg. Flu 12/1 > neg.  Antimicrobials:  None.   Interim history/subjective:  Currently on 3rd unit PRBC.  SBP in high 90s.  Per RN and EDP, once blood stops, BP dips into 60s. Denies any pain.  No further bleeding since arriving in ED.  Objective:  Blood pressure (!) 82/47, pulse 82, temperature 98.1 F (36.7 C), temperature source Oral, resp. rate (!) 24, height 5\' 2"  (1.575 m), weight 43.1 kg, SpO2 96 %.        Intake/Output Summary (Last 24 hours) at 04/22/2020 2055 Last data filed at 04/22/2020 2022 Gross per 24 hour  Intake 2125 ml  Output --  Net 2125 ml   Filed Weights   04/22/20 1816  Weight: 43.1 kg    Examination: General: Elderly female, in NAD. Neuro: A&O x 3, no deficits. HEENT: [place]/AT. Sclerae anicteric.  EOMI. Cardiovascular: RRR, no M/R/G.  Lungs: Respirations even and unlabored.  CTA bilaterally, No W/R/R.  Abdomen: BS x 4, soft, NT/ND.  Musculoskeletal: No gross deformities, no edema.  Skin: Intact, warm, no rashes.  Assessment & Plan:  GIB - presumed lower, likely diverticular.  S/p 3u PRBC due to shocky vitals in ED. - Admit to ICU for overnight obs given SBP dipping into 60s. - GI consulted by EDP, recommending CTA abd / pelv first.  This has been ordered and is pending. - Hold on further transfusions for now. - Monitor for signs of bleeding and plan to transfuse for Hgb < 7.  Hypokalemia. - 40 mEq K. - Follow BMP.  AKI - ? pre-renal from hypovolemia. - Supportive  care.  Hx PAF (on apixaban), MI s/p PCI 2003, HTN, HLD, CAD s/p CABG 2006, CHF, AS s/p TAVR. - Supportive care. - Hold home apixaban, irbesartan, isordil, lopressor, rosuvastatin.  Hx bronchitis, asthma. - Continue home Flovent, Ventolin.  Hx CVA. - Supportive care.  Best Practice:  Diet: NPO. Pain/Anxiety/Delirium protocol (if indicated): N/A. VAP protocol (if indicated): N/A. DVT prophylaxis: SCD's only. GI prophylaxis: N/A. Glucose control: SSI if glucose consistently > 180. Mobility: Bedrest. Last date of multidisciplinary goals of care discussion: None. Family and staff present: N/A. Summary of discussion: N/A. Follow up goals of care discussion due: 12/7. Code Status: Full. Family Communication: Daughter updated at bedside. Disposition: ICU.  Labs   CBC: Recent Labs  Lab 04/22/20 1736 04/22/20 1746 04/22/20 1912  WBC 11.9*  --  10.1  NEUTROABS 9.6*  --   --   HGB 11.1* 11.2* 11.3*  HCT 33.7* 33.0* 32.8*  MCV 91.3  --  89.1  PLT PLATELET CLUMPS NOTED ON SMEAR, UNABLE TO ESTIMATE  --  80*   Basic Metabolic Panel: Recent Labs  Lab 04/22/20 1736 04/22/20 1746  NA 135 135  K 3.2* 3.2*  CL 102 101  CO2 22  --   GLUCOSE 112* 103*  BUN 20 23  CREATININE 1.25* 1.20*  CALCIUM 8.8*  --    GFR: Estimated Creatinine Clearance: 21.6 mL/min (A) (by C-G formula based on SCr of 1.2 mg/dL (H)). Recent Labs  Lab 04/22/20 1736 04/22/20 1740 04/22/20 1912  WBC 11.9*  --  10.1  LATICACIDVEN  --  1.6  --    Liver Function Tests: Recent Labs  Lab 04/22/20 1736  AST 26  ALT 17  ALKPHOS 62  BILITOT 0.7  PROT 5.2*  ALBUMIN 3.1*   Recent Labs  Lab 04/22/20 1736  LIPASE 22   No results for input(s): AMMONIA in the last 168 hours. ABG    Component Value Date/Time   PHART 7.442 01/03/2017 1029   PCO2ART 42.6 01/03/2017 1029   PO2ART 168.0 (H) 01/03/2017 1029   HCO3 29.5 (H) 01/03/2017 1029   TCO2 22 04/22/2020 1746   O2SAT 100.0 01/03/2017 1029     Coagulation Profile: Recent Labs  Lab 04/22/20 1755  INR 1.6*   Cardiac Enzymes: No results for input(s): CKTOTAL, CKMB, CKMBINDEX, TROPONINI in the last 168 hours. HbA1C: Hemoglobin A1C  Date/Time Value Ref Range Status  12/08/2015 08:03 AM 5.8  Final   Hgb A1c MFr Bld  Date/Time Value Ref Range Status  12/26/2016 02:24 PM 5.7 (H) 4.8 - 5.6 % Final    Comment:    (NOTE)         Pre-diabetes: 5.7 - 6.4         Diabetes: >6.4         Glycemic control for adults with diabetes: <7.0    CBG: Recent Labs  Lab 04/22/20 1714  GLUCAP 107*    Review of Systems:   All negative; except for those that are  bolded, which indicate positives.  Constitutional: weight loss, weight gain, night sweats, fevers, chills, fatigue, weakness.  HEENT: headaches, sore throat, sneezing, nasal congestion, post nasal drip, difficulty swallowing, tooth/dental problems, visual complaints, visual changes, ear aches. Neuro: difficulty with speech, weakness, numbness, ataxia. CV:  chest pain, orthopnea, PND, swelling in lower extremities, dizziness, palpitations, syncope.  Resp: cough, hemoptysis, dyspnea, wheezing. GI: heartburn, indigestion, abdominal pain, nausea, vomiting, diarrhea, constipation, change in bowel habits, loss of appetite, hematemesis, melena, hematochezia.  GU: dysuria, change in color of urine, urgency or frequency, flank pain, hematuria. MSK: joint pain or swelling, decreased range of motion. Psych: change in mood or affect, depression, anxiety, suicidal ideations, homicidal ideations. Skin: rash, itching, bruising.   Past medical history  She,  has a past medical history of Anxiety, Aortic Stenosis s/p TAVR, Arthritis, Aspirin allergy, Asthma, Bronchitis, CAD (coronary artery disease), Carotid artery disease ([place]), Chronic stable angina ([place]), Diastolic dysfunction, Eczema, Fibromyalgia, GERD (gastroesophageal reflux disease), Heart murmur, Hiatal hernia, Hyperlipidemia,  Hypertension, Kyphoscoliosis, Mitral regurgitation, Myocardial infarction ([place]) (2003), PAF (paroxysmal atrial fibrillation) ([place]), Pneumonia (2015 ?), Pulmonary regurgitation, Skin cancer of arm, Stroke ([place]), and Tricuspid regurgitation.   Surgical History    Past Surgical History:  Procedure Laterality Date  . ABDOMINAL HYSTERECTOMY  1976  . CAROTID ENDARTERECTOMY  2010  . CORONARY ANGIOPLASTY WITH STENT PLACEMENT    . CORONARY ARTERY BYPASS GRAFT  01/2005   LIMA-D1; SVG-LAD; SVG-OM; SVG-PDA  . EYE SURGERY    . MULTIPLE EXTRACTIONS WITH ALVEOLOPLASTY  12/28/2016   Extraction of tooth #'s 2- 5,7-10, 12,13,17-20,and 22-29 with alveoloplasty and maxillary right and left lateral exostoses reductions  . MULTIPLE EXTRACTIONS WITH ALVEOLOPLASTY N/A 12/28/2016   Procedure: Extraction of tooth #'s 2- 5,7-10, 12,13,17-20,and 22-29 with alveoloplasty and maxillary right and left lateral exostoses reductions;  Surgeon: Lenn Cal, DDS;  Location: [place];  Service: Oral Surgery;  Laterality: N/A;  . RIGHT/LEFT HEART CATH AND CORONARY/GRAFT ANGIOGRAPHY N/A 10/27/2016   Procedure: Right/Left Heart Cath and Coronary/Graft Angiography;  Surgeon: Burnell Blanks, MD;  Location: [place] [facility];  Service: Cardiovascular;  Laterality: N/A;  . TEE WITHOUT CARDIOVERSION N/A 01/03/2017   Procedure: TRANSESOPHAGEAL ECHOCARDIOGRAM (TEE);  Surgeon: Burnell Blanks, MD;  Location: [place];  Service: Open Heart Surgery;  Laterality: N/A;  . TONSILLECTOMY    . TRANSCATHETER AORTIC VALVE REPLACEMENT, TRANSFEMORAL N/A 01/03/2017   Procedure: TRANSCATHETER AORTIC VALVE REPLACEMENT, TRANSFEMORAL;  Surgeon: Burnell Blanks, MD;  Location: [place];  Service: Open Heart Surgery;  Laterality: N/A;  . TUMOR REMOVAL       Social History   reports that she has never smoked. She quit smokeless tobacco use about 18 years ago.  Her smokeless tobacco use included snuff. She reports that she does not drink  alcohol and does not use drugs.   Family history   Her family history includes Heart failure in her mother; Other in her father.   Allergies Allergies  Allergen Reactions  . Bactrim [Sulfamethoxazole-Trimethoprim] Other (See Comments)    "makes her feel funny" or "unsteady"   . Amitriptyline Hcl Rash  . Aspirin Swelling and Rash    SWELLING REACTION UNSPECIFIED   . Zetia [Ezetimibe] Rash     Home meds  Prior to Admission medications   Medication Sig Start Date End Date Taking? Authorizing Provider  acetaminophen (TYLENOL) 650 MG CR tablet Take 1,300 mg by mouth 2 (two) times daily as needed for pain.     [provider]  apixaban (ELIQUIS) 2.5 MG TABS tablet Take 1 tablet (2.5 mg total) by mouth 2 (two) times daily. 12/16/19   Burnell Blanks, MD  calcium-vitamin D (OSCAL WITH D) 500-200 MG-UNIT per tablet Take 1 tablet by mouth daily with breakfast.    [provider]  cholecalciferol (VITAMIN D) 1000 UNITS tablet Take 1,000 Units by mouth daily.    [provider]  diazepam (VALIUM) 2 MG tablet Take 1 tablet (2 mg total) by mouth every 6 (six) hours as needed for anxiety. 11/02/18   Denita Lung, MD  doxycycline (VIBRA-TABS) 100 MG tablet Take 1 tablet (100 mg total) by mouth 2 (two) times daily. 04/01/20   Denita Lung, MD  FLOVENT HFA 220 MCG/ACT inhaler INHALE 2 PUFFS INTO THE LUNGS TWICE DAILY 11/18/19   Denita Lung, MD  fluticasone [facility]) 50 MCG/ACT nasal spray SHAKE LIQUID AND USE 2 SPRAYS IN EACH NOSTRIL EVERY DAY 09/23/19   Denita Lung, MD  guaiFENesin (MUCINEX) 600 MG 12 hr tablet Take 1 tablet (600 mg total) by mouth 2 (two) times daily. 05/25/14   Regalado, Belkys A, MD  guaiFENesin (ROBITUSSIN) 100 MG/5ML liquid Take 100 mg by mouth 3 (three) times daily as needed for cough. Reported on 12/08/2015 Patient not taking: Reported on 03/25/2020    [provider]  irbesartan (AVAPRO) 75 MG tablet TAKE 1 TABLET(75 MG) BY MOUTH  DAILY 03/09/20   Burnell Blanks, MD  isosorbide dinitrate (ISORDIL) 30 MG tablet Take 1 tablet (30 mg total) by mouth 2 (two) times daily. Please keep upcoming appt in September with Dr. Angelena Form before anymore refills. Thank you 01/28/20   Burnell Blanks, MD  loratadine (CLARITIN) 10 MG tablet Take 10 mg by mouth daily.      [provider]  metoprolol tartrate (LOPRESSOR) 25 MG tablet TAKE 1/2 TABLET BY MOUTH TWICE DAILY 03/17/20   Denita Lung, MD  nitroGLYCERIN (NITROSTAT) 0.4 MG SL tablet PLACE 1 TABLET UNDER TONGUE EVERY 5 MINUTES AS NEEDED FOR CHEST PAIN 03/06/20   Burnell Blanks, MD  pantoprazole (PROTONIX) 40 MG tablet TAKE 1 TABLET BY MOUTH TWICE DAILY 03/06/20   Denita Lung, MD  rosuvastatin (CRESTOR) 40 MG tablet TAKE 1 TABLET BY MOUTH EVERY DAY 03/30/20   Denita Lung, MD  VENTOLIN HFA 108 (90 Base) MCG/ACT inhaler INHALE 2 PUFFS BY MOUTH EVERY 6 HOURS AS NEEDED FOR WHEEZING OR SHORTNESS OF BREATH 02/12/20   Denita Lung, MD    Critical care time: 40 min.    Montey Hora, [facility] 04/22/2020, 8:55 PM

## 2020-04-22 NOTE — ED Notes (Signed)
Per Dr. Ronnald Nian give emergency release blood now.

## 2020-04-22 NOTE — ED Notes (Signed)
Pt admitted to 3M10; report called to [place], [place].  MD says pt may go to IR first; awaiting orders; Orene Desanctis is aware.

## 2020-04-22 NOTE — ED Provider Notes (Signed)
[facility] EMERGENCY DEPARTMENT Provider Note   CSN: [id number] Arrival date & time: 04/22/20  1710     History Chief Complaint  Patient presents with  . Hypotension  . Rectal Bleeding    [name] is a 86 y.o. female.  The history is provided by the patient and a caregiver.  Weakness Severity:  Moderate Onset quality:  Gradual Timing:  Constant Progression:  Worsening Chronicity:  New Context comment:  Maroon stools yesterday, bright red blood in stool and clot once today. Weak in legs Relieved by:  Nothing Worsened by:  Standing Associated symptoms: hematochezia   Associated symptoms: no abdominal pain, no anorexia, no aphasia, no arthralgias, no chest pain, no cough, no diarrhea, no dysuria, no fever, no near-syncope, no seizures, no shortness of breath and no vomiting        Past Medical History:  Diagnosis Date  . Anxiety   . Aortic Stenosis s/p TAVR    Echo 10/21: EF 55-60, no RWMA, mild asymmetric LVH, normal RVSF, RVSP 40.2 mmHg, mild MR, s/p TAVR with mean gradient 8.67mmHg, no PVL  . Arthritis    OSTEO  . Aspirin allergy    on Plavix  . Asthma   . Bronchitis   . CAD (coronary artery disease)    a. s/p CABG 2006 with Cox-Maze procedure.  . Carotid artery disease ([place])    a. s/p R CEA.  . Chronic stable angina ([place])   . Diastolic dysfunction   . Eczema   . Fibromyalgia   . GERD (gastroesophageal reflux disease)   . Heart murmur   . Hiatal hernia   . Hyperlipidemia   . Hypertension   . Kyphoscoliosis   . Mitral regurgitation   . Myocardial infarction [facility]) 2003   Stent to CFX  . PAF (paroxysmal atrial fibrillation) ([place])    a. pt has h/o hematuria on Eliquis and has since refused anticoagulation  . Pneumonia 2015 ?  Marland Kitchen Pulmonary regurgitation   . Skin cancer of arm   . Stroke ([place])   . Tricuspid regurgitation     Patient Active Problem List   Diagnosis Date Noted  . GI bleed 04/22/2020  . Hypotension   . AKI (acute  kidney injury) ([place])   . Hypokalemia   . Hematochezia   . Shock circulatory ([place])   . Presbycusis of both ears 11/13/2019  . Vertigo 03/27/2017  . Arthritis 03/27/2017  . Stroke ([place])   . Status post transcatheter aortic valve replacement (TAVR) using bioprosthesis 01/03/2017  . Chronic apical periodontitis 12/28/2016  . Retained dental roots 12/28/2016  . Bilateral maxillary lateral exostoses 12/28/2016  . Diastolic dysfunction   . Chronic stable angina ([place])   . Tricuspid regurgitation   . Pulmonary regurgitation   . Left shoulder pain   . Back pain-similar to pt's angina 12/01/2015  . Carotid artery disease- s/p RCE 12/01/2015  . Bowen's disease 03/16/2015  . Dependent edema 10/30/2014  . History of allergy to aspirin 06/02/2014  . PAF (paroxysmal atrial fibrillation) ([place])   . Gastroesophageal reflux disease without esophagitis 12/05/2011  . Hx of CABG 2006 with Maze 07/21/2011  . Mitral regurgitation 07/21/2011  . Allergic rhinitis 10/05/2010  . Hyperlipidemia 10/05/2010  . Asthma 10/19/2007    Past Surgical History:  Procedure Laterality Date  . ABDOMINAL HYSTERECTOMY  1976  . CAROTID ENDARTERECTOMY  2010  . CORONARY ANGIOPLASTY WITH STENT PLACEMENT    . CORONARY ARTERY BYPASS GRAFT  01/2005   LIMA-D1;  SVG-LAD; SVG-OM; SVG-PDA  . EYE SURGERY    . MULTIPLE EXTRACTIONS WITH ALVEOLOPLASTY  12/28/2016   Extraction of tooth #'s 2- 5,7-10, 12,13,17-20,and 22-29 with alveoloplasty and maxillary right and left lateral exostoses reductions  . MULTIPLE EXTRACTIONS WITH ALVEOLOPLASTY N/A 12/28/2016   Procedure: Extraction of tooth #'s 2- 5,7-10, 12,13,17-20,and 22-29 with alveoloplasty and maxillary right and left lateral exostoses reductions;  Surgeon: Lenn Cal, DDS;  Location: [facility];  Service: Oral Surgery;  Laterality: N/A;  . RIGHT/LEFT HEART CATH AND CORONARY/GRAFT ANGIOGRAPHY N/A 10/27/2016   Procedure: Right/Left Heart Cath and Coronary/Graft Angiography;  Surgeon:  Burnell Blanks, MD;  Location: [facility] CV LAB;  Service: Cardiovascular;  Laterality: N/A;  . TEE WITHOUT CARDIOVERSION N/A 01/03/2017   Procedure: TRANSESOPHAGEAL ECHOCARDIOGRAM (TEE);  Surgeon: Burnell Blanks, MD;  Location: [facility];  Service: Open Heart Surgery;  Laterality: N/A;  . TONSILLECTOMY    . TRANSCATHETER AORTIC VALVE REPLACEMENT, TRANSFEMORAL N/A 01/03/2017   Procedure: TRANSCATHETER AORTIC VALVE REPLACEMENT, TRANSFEMORAL;  Surgeon: Burnell Blanks, MD;  Location: [facility];  Service: Open Heart Surgery;  Laterality: N/A;  . TUMOR REMOVAL       OB History   No obstetric history on file.     Family History  Problem Relation Age of Onset  . Heart failure Mother   . Other Father     Social History   Tobacco Use  . Smoking status: Never Smoker  . Smokeless tobacco: Former Systems developer    Types: Snuff  Vaping Use  . Vaping Use: Never used  Substance Use Topics  . Alcohol use: No  . Drug use: No    Home Medications Prior to Admission medications   Medication Sig Start Date End Date Taking? Authorizing Provider  acetaminophen (TYLENOL) 650 MG CR tablet Take 1,300 mg by mouth 2 (two) times daily as needed for pain.    Yes [provider]  apixaban (ELIQUIS) 2.5 MG TABS tablet Take 1 tablet (2.5 mg total) by mouth 2 (two) times daily. 12/16/19  Yes Burnell Blanks, MD  Cholecalciferol (VITAMIN D-3) 25 MCG (1000 UT) CAPS Take 1,000 Units by mouth daily.   Yes [provider]  diazepam (VALIUM) 2 MG tablet Take 1 tablet (2 mg total) by mouth every 6 (six) hours as needed for anxiety. Patient taking differently: Take 2 mg by mouth daily as needed for anxiety (or vertigo).  11/02/18  Yes Denita Lung, MD  Ensure Plus (ENSURE PLUS) LIQD Take 237 mLs by mouth 2 (two) times daily between meals.   Yes [provider]  FLOVENT HFA 220 MCG/ACT inhaler INHALE 2 PUFFS INTO THE LUNGS TWICE DAILY Patient taking differently: Inhale 2  puffs into the lungs daily.  11/18/19  Yes Denita Lung, MD  fluticasone (FLONASE) 50 MCG/ACT nasal spray SHAKE LIQUID AND USE 2 SPRAYS IN EACH NOSTRIL EVERY DAY Patient taking differently: Place 2 sprays into both nostrils daily as needed for allergies or rhinitis.  09/23/19  Yes Denita Lung, MD  guaiFENesin (MUCINEX) 600 MG 12 hr tablet Take 1 tablet (600 mg total) by mouth 2 (two) times daily. 05/25/14  Yes Regalado, Belkys A, MD  guaiFENesin (ROBITUSSIN) 100 MG/5ML liquid Take 100 mg by mouth 2 (two) times daily as needed for cough or congestion. Reported on 12/08/2015   Yes [provider]  irbesartan (AVAPRO) 75 MG tablet TAKE 1 TABLET(75 MG) BY MOUTH DAILY Patient taking differently: Take 75 mg by mouth daily.  03/09/20  Yes Burnell Blanks, MD  isosorbide dinitrate (ISORDIL) 30 MG tablet Take 1 tablet (30 mg total) by mouth 2 (two) times daily. Please keep upcoming appt in September with Dr. Angelena Form before anymore refills. Thank you Patient taking differently: Take 30 mg by mouth 2 (two) times daily.  01/28/20  Yes Burnell Blanks, MD  loratadine (CLARITIN) 10 MG tablet Take 10 mg by mouth daily.     Yes [provider]  metoprolol tartrate (LOPRESSOR) 25 MG tablet TAKE 1/2 TABLET BY MOUTH TWICE DAILY Patient taking differently: Take 12.5 mg by mouth 2 (two) times daily.  03/17/20  Yes Denita Lung, MD  nitroGLYCERIN (NITROSTAT) 0.4 MG SL tablet PLACE 1 TABLET UNDER TONGUE EVERY 5 MINUTES AS NEEDED FOR CHEST PAIN Patient taking differently: Place 0.4 mg under the tongue every 5 (five) minutes as needed for chest pain.  03/06/20  Yes Burnell Blanks, MD  pantoprazole (PROTONIX) 40 MG tablet TAKE 1 TABLET BY MOUTH TWICE DAILY Patient taking differently: Take 40 mg by mouth 2 (two) times daily before a meal.  03/06/20  Yes Denita Lung, MD  rosuvastatin (CRESTOR) 40 MG tablet TAKE 1 TABLET BY MOUTH EVERY DAY Patient taking differently: Take 40 mg  by mouth every evening.  03/30/20  Yes Denita Lung, MD  VENTOLIN HFA 108 (90 Base) MCG/ACT inhaler INHALE 2 PUFFS BY MOUTH EVERY 6 HOURS AS NEEDED FOR WHEEZING OR SHORTNESS OF BREATH Patient taking differently: Inhale 2 puffs into the lungs every 6 (six) hours as needed for wheezing or shortness of breath.  02/12/20  Yes Denita Lung, MD  doxycycline (VIBRA-TABS) 100 MG tablet Take 1 tablet (100 mg total) by mouth 2 (two) times daily. Patient not taking: Reported on 04/22/2020 04/01/20   Denita Lung, MD    Allergies    Other, Bactrim [sulfamethoxazole-trimethoprim], Tape, Amitriptyline hcl, Aspirin, and Zetia [ezetimibe]  Review of Systems   Review of Systems  Constitutional: Negative for chills and fever.  HENT: Negative for ear pain and sore throat.   Eyes: Negative for pain and visual disturbance.  Respiratory: Negative for cough and shortness of breath.   Cardiovascular: Negative for chest pain, palpitations and near-syncope.  Gastrointestinal: Positive for blood in stool and hematochezia. Negative for abdominal pain, anorexia, diarrhea and vomiting.  Genitourinary: Negative for dysuria and hematuria.  Musculoskeletal: Negative for arthralgias and back pain.  Skin: Negative for color change and rash.  Neurological: Positive for weakness. Negative for seizures and syncope.  All other systems reviewed and are negative.   Physical Exam Updated Vital Signs  ED Triage Vitals  Enc Vitals Group     BP 04/22/20 1724 (!) 72/42     Pulse Rate 04/22/20 1724 86     Resp 04/22/20 1724 (!) 29     Temp 04/22/20 1725 97.7 F (36.5 C)     Temp Source 04/22/20 1725 Oral     SpO2 04/22/20 1724 94 %     Weight 04/22/20 1816 95 lb (43.1 kg)     Height 04/22/20 1816 5\' 2"  (1.575 m)     Head Circumference --      Peak Flow --      Pain Score 04/22/20 1816 0     Pain Loc --      Pain Edu? --      Excl. in [place]? --     Physical Exam Vitals and nursing note reviewed.  Constitutional:  General: She is not in acute distress.    Appearance: She is well-developed. She is not ill-appearing.  HENT:     Head: Normocephalic and atraumatic.     Nose: Nose normal.     Mouth/Throat:     Mouth: Mucous membranes are moist.  Eyes:     Extraocular Movements: Extraocular movements intact.     Conjunctiva/sclera: Conjunctivae normal.     Pupils: Pupils are equal, round, and reactive to light.     Comments: Pale conjuctiva   Cardiovascular:     Rate and Rhythm: Normal rate and regular rhythm.     Pulses: Normal pulses.     Heart sounds: Normal heart sounds. No murmur heard.   Pulmonary:     Effort: Pulmonary effort is normal. No respiratory distress.     Breath sounds: Normal breath sounds.  Abdominal:     Palpations: Abdomen is soft.     Tenderness: There is no abdominal tenderness.  Genitourinary:    Rectum: Guaiac result positive (maroon stool).  Musculoskeletal:        General: Normal range of motion.     Cervical back: Normal range of motion and neck supple.  Skin:    General: Skin is warm and dry.     Capillary Refill: Capillary refill takes less than 2 seconds.  Neurological:     General: No focal deficit present.     Mental Status: She is alert.  Psychiatric:        Mood and Affect: Mood normal.     ED Results / Procedures / Treatments   Labs (all labs ordered are listed, but only abnormal results are displayed) Labs Reviewed  CBC WITH DIFFERENTIAL/PLATELET - Abnormal; Notable for the following components:      Result Value   WBC 11.9 (*)    RBC 3.69 (*)    Hemoglobin 11.1 (*)    HCT 33.7 (*)    Neutro Abs 9.6 (*)    Abs Immature Granulocytes 0.08 (*)    All other components within normal limits  COMPREHENSIVE METABOLIC PANEL - Abnormal; Notable for the following components:   Potassium 3.2 (*)    Glucose, Bld 112 (*)    Creatinine, Ser 1.25 (*)    Calcium 8.8 (*)    Total Protein 5.2 (*)    Albumin 3.1 (*)    GFR, Estimated 41 (*)    All  other components within normal limits  PROTIME-INR - Abnormal; Notable for the following components:   Prothrombin Time 18.2 (*)    INR 1.6 (*)    All other components within normal limits  CBC - Abnormal; Notable for the following components:   RBC 3.68 (*)    Hemoglobin 11.3 (*)    HCT 32.8 (*)    Platelets 80 (*)    All other components within normal limits  CBG MONITORING, ED - Abnormal; Notable for the following components:   Glucose-Capillary 107 (*)    All other components within normal limits  POC OCCULT BLOOD, ED - Abnormal; Notable for the following components:   Fecal Occult Bld POSITIVE (*)    All other components within normal limits  I-STAT CHEM 8, ED - Abnormal; Notable for the following components:   Potassium 3.2 (*)    Creatinine, Ser 1.20 (*)    Glucose, Bld 103 (*)    Hemoglobin 11.2 (*)    HCT 33.0 (*)    All other components within normal limits  TROPONIN I (HIGH SENSITIVITY) -  Abnormal; Notable for the following components:   Troponin I (High Sensitivity) 51 (*)    All other components within normal limits  RESP PANEL BY RT-PCR (FLU A&B, COVID) ARPGX2  URINE CULTURE  CULTURE, BLOOD (ROUTINE X 2)  CULTURE, BLOOD (ROUTINE X 2)  LIPASE, BLOOD  LACTIC ACID, PLASMA  LACTIC ACID, PLASMA  URINALYSIS, ROUTINE W REFLEX MICROSCOPIC  CBC  BASIC METABOLIC PANEL  MAGNESIUM  PHOSPHORUS  TYPE AND SCREEN  PREPARE RBC (CROSSMATCH)  PREPARE RBC (CROSSMATCH)  PREPARE RBC (CROSSMATCH)  TROPONIN I (HIGH SENSITIVITY)    EKG EKG Interpretation  Date/Time:  Wednesday April 22 2020 17:23:57 EST Ventricular Rate:  80 PR Interval:    QRS Duration: 97 QT Interval:  390 QTC Calculation: 450 R Axis:   77 Text Interpretation: Atrial fibrillation Borderline repolarization abnormality Artifact in lead(s) I III aVR aVL aVF Confirmed by Lennice Sites [phone]) on 04/22/2020 6:51:10 PM   Radiology DG Chest Portable 1 View  Result Date: 04/22/2020 CLINICAL DATA:   86 year old female with hypertension. EXAM: PORTABLE CHEST 1 VIEW COMPARISON:  Chest radiograph dated 01/03/2017. FINDINGS: Minimal left lung base atelectasis. A small left pleural effusion may be present. The right lung is clear. Probable small calcified right lower lobe granuloma. No focal consolidation or pneumothorax. The cardiac silhouette is within limits. Median sternotomy wires and CABG vascular clips. Aortic valve repair. Atherosclerotic calcification of the aorta. No acute osseous pathology. IMPRESSION: Minimal left lung base atelectasis. A small left pleural effusion may be present. Electronically Signed   By: Anner Crete M.D.   On: 04/22/2020 18:13   CT Angio Abd/Pel W and/or Wo Contrast  Addendum Date: 04/22/2020   ADDENDUM REPORT: 04/22/2020 22:05 ADDENDUM: Please note the high density content seen in the proximal transverse colon on arterial phase does not appear to pool on the delayed venous phase and may represent an artifact. A GI bleed nuclear scan may provide better evaluation. There is long segment thickening of the distal descending, and sigmoid colon which may be related to muscular hypertrophy secondary to chronic inflammation. There is however some perisigmoid stranding and probable mild edema of the distal colonic wall which may represent mild colitis or diverticulitis. Clinical correlation is recommended. No abscess or perforation. Electronically Signed   By: Anner Crete M.D.   On: 04/22/2020 22:05   Result Date: 04/22/2020 CLINICAL DATA:  86 year old female with hypotension. Rectal bleed. EXAM: CTA ABDOMEN AND PELVIS WITHOUT AND WITH CONTRAST TECHNIQUE: Multidetector CT imaging of the abdomen and pelvis was performed using the standard protocol during bolus administration of intravenous contrast. Multiplanar reconstructed images and MIPs were obtained and reviewed to evaluate the vascular anatomy. CONTRAST:  60mL OMNIPAQUE IOHEXOL 350 MG/ML SOLN COMPARISON:  CT dated  11/01/2016. FINDINGS: VASCULAR Aorta: Advanced atherosclerotic calcification of the abdominal aorta. The aorta is ectatic measuring up to 2.3 cm in diameter. No aneurysmal dilatation or dissection. No periaortic fluid collection or edema. Celiac: The celiac axis and its major branches are patent. SMA: The SMA is patent. Renals: Atherosclerotic calcification of the origins of the renal arteries. The renal arteries are patent. IMA: The IMA is patent. Inflow: Advanced atherosclerotic calcification of the iliac arteries. No aneurysmal dilatation or dissection. Proximal Outflow: Atherosclerotic calcification. No acute findings. Veins: The IVC is unremarkable. The SMV, splenic vein, and main portal vein are patent. No portal venous gas. Review of the MIP images confirms the above findings. NON-VASCULAR Lower chest: The visualized lung bases are clear. There is eventration of the  left hemidiaphragm. No intra-abdominal free air or free fluid. Hepatobiliary: The liver is unremarkable. No intrahepatic biliary dilatation. The gallbladder is distended. There are multiple stones within the gallbladder. No pericholecystic fluid or evidence of acute cholecystitis. Multiple small stones noted in the common bile duct with combined length of approximately 1 cm (62/15). Pancreas: Unremarkable. No pancreatic ductal dilatation or surrounding inflammatory changes. Spleen: Normal in size without focal abnormality. Adrenals/Urinary Tract: The adrenal glands unremarkable. There is a 3 mm nonobstructing right renal inferior pole calculus. No hydronephrosis. There is no hydronephrosis or nephrolithiasis on the left. There is a 5 cm right renal upper pole cyst. The visualized ureters and urinary bladder appear unremarkable. Stomach/Bowel: There is sigmoid diverticulosis with segmental muscular hypertrophy. There is perisigmoid stranding consistent with acute diverticulitis. No diverticular abscess or perforation. There is contrast within the  lumen of the proximal transverse colon (73/12, 73/19, and 42/15) not present on precontrast images concerning for active GI bleed. Evaluation of the bowel however is limited due to respiratory motion artifact. There is no bowel obstruction. The appendix is not visualized with certainty. No inflammatory changes identified in the right lower quadrant. Lymphatic: No adenopathy. Reproductive: Hysterectomy. Other: None Musculoskeletal: Osteopenia with scoliosis and degenerative changes of the spine. No acute osseous pathology. IMPRESSION: 1. Findings concerning for active GI bleed involving the proximal transverse colon. Clinical correlation is recommended. A GI bleed nuclear scan may provide additional information. 2. Sigmoid diverticulitis. No diverticular abscess or perforation. 3. Cholelithiasis and choledocholithiasis. No evidence of acute cholecystitis. 4. A 3 mm nonobstructing right renal inferior pole calculus. No hydronephrosis. Electronically Signed: By: Anner Crete M.D. On: 04/22/2020 21:25    Procedures .Critical Care Performed by: Lennice Sites, DO Authorized by: Lennice Sites, DO   Critical care provider statement:    Critical care time (minutes):  75   Critical care was necessary to treat or prevent imminent or life-threatening deterioration of the following conditions:  Shock and circulatory failure   Critical care was time spent personally by me on the following activities:  Blood draw for specimens, development of treatment plan with patient or surrogate, discussions with consultants, discussions with primary provider, evaluation of patient's response to treatment, examination of patient, obtaining history from patient or surrogate, ordering and performing treatments and interventions, ordering and review of laboratory studies, ordering and review of radiographic studies, pulse oximetry, re-evaluation of patient's condition and review of old charts   (including critical care  time)  Medications Ordered in ED Medications  0.9 %  sodium chloride infusion (10 mL/hr Intravenous Not Given 04/22/20 2213)  0.9 %  sodium chloride infusion (10 mL/hr Intravenous Not Given 04/22/20 1937)  budesonide (PULMICORT) nebulizer solution 0.25 mg (0.25 mg Inhalation Given 04/22/20 2148)  albuterol (VENTOLIN HFA) 108 (90 Base) MCG/ACT inhaler 1-2 puff (has no administration in time range)  prothrombin complex conc human (KCENTRA) IVPB 2,170 Units (has no administration in time range)  lactated ringers bolus 1,000 mL (0 mLs Intravenous Stopped 04/22/20 1810)  0.9 %  sodium chloride infusion (0 mL/hr Intravenous Stopped 04/22/20 2122)  potassium chloride SA (KLOR-CON) CR tablet 40 mEq (40 mEq Oral Given 04/22/20 2148)  iohexol (OMNIPAQUE) 350 MG/ML injection 75 mL (75 mLs Intravenous Contrast Given 04/22/20 2106)    ED Course  I have reviewed the triage vital signs and the nursing notes.  Pertinent labs & imaging results that were available during my care of the patient were reviewed by me and considered in my medical decision making (  see chart for details).    MDM Rules/Calculators/A&P                          [name] is an 86 year old female with history of high cholesterol, CAD, A. fib on Eliquis who presents to the ED with weakness, rectal bleeding.  Patient hypotensive in the 70s upon arrival.  Otherwise normal vitals.  Had maroon color stools yesterday.  Had fairly large volume bright red blood loss today one time.  Has felt very weak in the legs and too weak to walk.  Hemoccult is positive and patient with grossly bloody and maroon stool on exam.  Concern for acute GI bleed.  History of diverticular problems in the past and polyps and hemorrhoids.  Follows with Dr. Collene Mares with GI.  Given the hypotension and blood loss patient was given 1 unit packed emergency release blood.  Lab work was ordered.  Overall suspect GI bleed causing hypotension.  No fever.  No infectious symptoms.   Infectious work-up will be pursued as well with lactic acid and blood cultures, urinalysis and chest x-ray.  Hemoglobin came back at 11.2.  Otherwise lab work is unremarkable.  No significant leukocytosis or lactic acidosis.  No pneumonia on chest x-ray.  Talked with Dr. Collene Mares with GI and we do suspect a GI bleed.  She does recommend a CT angiography of the abdomen and pelvis if stable.  On reevaluation patient became hypotensive again after having a little bit of normotensive blood pressures after unit of blood.  At that time an additional unit of blood was given and blood pressure improved.  On reevaluation she once again dropped her blood pressure and a repeat emergency release blood was given.  Bedside echocardiogram showed no effusion.  She was able to get over for CT scan that did not show definitively any active bleeding.  May be diverticulitis.  ICU was consulted given the hypotension and concern for active GI bleed.  Likely will do a tagged RBC study to see if they find the source of a bleed.  Will defer any antibiotic management to them.  Patient was given Kcentra to help reverse Eliquis.  Overall blood pressure stabilized after the third unit of blood.  Patient admitted to the ICU for further care.  GI will follow.   Final Clinical Impression(s) / ED Diagnoses Final diagnoses:  Gastrointestinal hemorrhage, unspecified gastrointestinal hemorrhage type  Hypotension, unspecified hypotension type    Rx / DC Orders ED Discharge Orders    None       Lennice Sites, DO 04/22/20 2306

## 2020-04-23 ENCOUNTER — Inpatient Hospital Stay (HOSPITAL_COMMUNITY): Payer: Medicare Other

## 2020-04-23 DIAGNOSIS — R579 Shock, unspecified: Secondary | ICD-10-CM | POA: Diagnosis not present

## 2020-04-23 DIAGNOSIS — K922 Gastrointestinal hemorrhage, unspecified: Secondary | ICD-10-CM | POA: Diagnosis not present

## 2020-04-23 LAB — TYPE AND SCREEN
ABO/RH(D): A POS
Antibody Screen: NEGATIVE
Unit division: 0
Unit division: 0
Unit division: 0

## 2020-04-23 LAB — CBC
HCT: 43.2 % (ref 36.0–46.0)
HCT: 47.1 % — ABNORMAL HIGH (ref 36.0–46.0)
Hemoglobin: 15.2 g/dL — ABNORMAL HIGH (ref 12.0–15.0)
Hemoglobin: 16.8 g/dL — ABNORMAL HIGH (ref 12.0–15.0)
MCH: 30.8 pg (ref 26.0–34.0)
MCH: 30.8 pg (ref 26.0–34.0)
MCHC: 35.2 g/dL (ref 30.0–36.0)
MCHC: 35.7 g/dL (ref 30.0–36.0)
MCV: 87.4 fL (ref 80.0–100.0)
MCV: 86.4 fL (ref 80.0–100.0)
Platelets: 68 10*3/uL — ABNORMAL LOW (ref 150–400)
Platelets: 73 10*3/uL — ABNORMAL LOW (ref 150–400)
RBC: 4.94 MIL/uL (ref 3.87–5.11)
RBC: 5.45 MIL/uL — ABNORMAL HIGH (ref 3.87–5.11)
RDW: 13.6 % (ref 11.5–15.5)
RDW: 14.2 % (ref 11.5–15.5)
WBC: 8.8 10*3/uL (ref 4.0–10.5)
WBC: 9.6 10*3/uL (ref 4.0–10.5)
nRBC: 0 % (ref 0.0–0.2)
nRBC: 0 % (ref 0.0–0.2)

## 2020-04-23 LAB — BASIC METABOLIC PANEL
Anion gap: 13 (ref 5–15)
BUN: 13 mg/dL (ref 8–23)
CO2: 20 mmol/L — ABNORMAL LOW (ref 22–32)
Calcium: 9 mg/dL (ref 8.9–10.3)
Chloride: 106 mmol/L (ref 98–111)
Creatinine, Ser: 0.98 mg/dL (ref 0.44–1.00)
GFR, Estimated: 55 mL/min — ABNORMAL LOW (ref >60)
Glucose, Bld: 85 mg/dL (ref 70–99)
Potassium: 3.2 mmol/L — ABNORMAL LOW (ref 3.5–5.1)
Sodium: 139 mmol/L (ref 135–145)

## 2020-04-23 LAB — COMPREHENSIVE METABOLIC PANEL
ALT: 21 U/L (ref 0–44)
AST: 29 U/L (ref 15–41)
Albumin: 2.8 g/dL — ABNORMAL LOW (ref 3.5–5.0)
Alkaline Phosphatase: 60 U/L (ref 38–126)
Anion gap: 11 (ref 5–15)
BUN: 12 mg/dL (ref 8–23)
CO2: 21 mmol/L — ABNORMAL LOW (ref 22–32)
Calcium: 9.2 mg/dL (ref 8.9–10.3)
Chloride: 108 mmol/L (ref 98–111)
Creatinine, Ser: 0.92 mg/dL (ref 0.44–1.00)
GFR, Estimated: 60 mL/min — ABNORMAL LOW (ref >60)
Glucose, Bld: 73 mg/dL (ref 70–99)
Potassium: 3.5 mmol/L (ref 3.5–5.1)
Sodium: 140 mmol/L (ref 135–145)
Total Bilirubin: 1.1 mg/dL (ref 0.3–1.2)
Total Protein: 4.9 g/dL — ABNORMAL LOW (ref 6.5–8.1)

## 2020-04-23 LAB — GLUCOSE, CAPILLARY
Glucose-Capillary: 44 mg/dL — CL (ref 70–99)
Glucose-Capillary: 60 mg/dL — ABNORMAL LOW (ref 70–99)
Glucose-Capillary: 77 mg/dL (ref 70–99)
Glucose-Capillary: 78 mg/dL (ref 70–99)
Glucose-Capillary: 90 mg/dL (ref 70–99)

## 2020-04-23 LAB — BPAM RBC
Blood Product Expiration Date: 202112132359
Blood Product Expiration Date: 202112302359
Blood Product Expiration Date: 202112302359
ISSUE DATE / TIME: 202112011741
ISSUE DATE / TIME: 202112011911
ISSUE DATE / TIME: 202112012013
Unit Type and Rh: 5100
Unit Type and Rh: 6200
Unit Type and Rh: 6200

## 2020-04-23 LAB — PHOSPHORUS: Phosphorus: 2.5 mg/dL (ref 2.5–4.6)

## 2020-04-23 LAB — TROPONIN I (HIGH SENSITIVITY): Troponin I (High Sensitivity): 57 ng/L — ABNORMAL HIGH (ref <18)

## 2020-04-23 LAB — MAGNESIUM: Magnesium: 1.4 mg/dL — ABNORMAL LOW (ref 1.7–2.4)

## 2020-04-23 LAB — MRSA PCR SCREENING: MRSA by PCR: NEGATIVE

## 2020-04-23 MED ORDER — POTASSIUM CHLORIDE 10 MEQ/100ML IV SOLN
10.0000 meq | INTRAVENOUS | Status: AC
  Administered 2020-04-23 (×6): 10 meq via INTRAVENOUS
  Filled 2020-04-23 (×6): qty 100

## 2020-04-23 MED ORDER — MAGNESIUM SULFATE 4 GM/100ML IV SOLN
4.0000 g | Freq: Once | INTRAVENOUS | Status: AC
  Administered 2020-04-23: 4 g via INTRAVENOUS
  Filled 2020-04-23: qty 100

## 2020-04-23 MED ORDER — BUDESONIDE 0.25 MG/2ML IN SUSP
0.5000 mg | Freq: Two times a day (BID) | RESPIRATORY_TRACT | Status: DC
  Administered 2020-04-23: 0.5 mg via RESPIRATORY_TRACT
  Administered 2020-04-24: 0.25 mg via RESPIRATORY_TRACT
  Administered 2020-04-25: 0.5 mg via RESPIRATORY_TRACT
  Administered 2020-04-25: 0.25 mg via RESPIRATORY_TRACT
  Administered 2020-04-26 – 2020-04-27 (×3): 0.5 mg via RESPIRATORY_TRACT
  Filled 2020-04-23 (×9): qty 4

## 2020-04-23 MED ORDER — CHLORHEXIDINE GLUCONATE CLOTH 2 % EX PADS
6.0000 | MEDICATED_PAD | Freq: Every day | CUTANEOUS | Status: DC
  Administered 2020-04-23 – 2020-04-26 (×4): 6 via TOPICAL

## 2020-04-23 MED ORDER — DEXTROSE 50 % IV SOLN
25.0000 g | INTRAVENOUS | Status: AC
  Administered 2020-04-24: 25 g via INTRAVENOUS
  Filled 2020-04-23: qty 50

## 2020-04-23 NOTE — Progress Notes (Signed)
NAME:  [name], MRN:  [id number], DOB:  1928/10/30, LOS: 1 ADMISSION DATE:  04/22/2020, CONSULTATION DATE:  04/22/20 REFERRING MD:  Ronnald Nian  CHIEF COMPLAINT:  GI Bleed   Brief History   86 y/o F, with PMH of AF on apixaban, who was admitted 12/1 with weakness, fatigue x3 days. Found to have hypotension presumed 2/2 lower GI bleed.  She was brought to ED were FOB was positive.  Initial SBP's were in the 70s and 60's range.  She was treated with emergency release blood and as blood was running, BP would improve to 90s then quickly fall back to 60s.  She had 3u PRBC while in ED despite Hgb 11.2 due to her clinical picture (shocky vitals and pale appearance).   Case discussed with Dr. Collene Mares of GI who recommended CTA before deciding on any GI interventions.  Due to BP dipping into the 60's, PCCM called for admission.  Past Medical History  has Asthma; Allergic rhinitis; Hyperlipidemia; Hx of CABG 2006 with [facility]; Mitral regurgitation; Gastroesophageal reflux disease without esophagitis; PAF (paroxysmal atrial fibrillation) ([place]); History of allergy to aspirin; Dependent edema; Bowen's disease; Back pain-similar to pt's angina; Carotid artery disease- s/p RCE; Left shoulder pain; Diastolic dysfunction; Chronic stable angina ([place]); Tricuspid regurgitation; Pulmonary regurgitation; Chronic apical periodontitis; Retained dental roots; Bilateral maxillary lateral exostoses; Status post transcatheter aortic valve replacement (TAVR) using bioprosthesis; Stroke ([place]); Vertigo; Arthritis; Presbycusis of both ears; GI bleed; Hypotension; AKI (acute kidney injury) ([place]); Hypokalemia; Hematochezia; and Shock circulatory ([place]) on their problem list.  Significant Hospital Events   12/01 Admit  Consults:  GI  Procedures:     Significant Diagnostic Tests:  CTA abd / pelv 12/1 >> findings concerning for active GIB involving the proximal transverse colon, sigmoid diverticulitis, no diverticular abscess or  perforation NM GI Blood Loss Study 12/2 >>   Micro Data:  COVID 12/1 > neg Flu 12/1 > neg  Antimicrobials:     Interim history/subjective:  Blood completed  Pt vitals stable / improved post transfusion, no further bleeding  Afebrile   Objective:  Blood pressure 123/61, pulse 67, temperature 98.1 F (36.7 C), temperature source Oral, resp. rate 19, height 5\' 2"  (1.575 m), weight 46.8 kg, SpO2 95 %.        Intake/Output Summary (Last 24 hours) at 04/23/2020 0959 Last data filed at 04/23/2020 0800 Gross per 24 hour  Intake 2377.62 ml  Output --  Net 2377.62 ml   Filed Weights   04/22/20 1816 04/23/20 0452  Weight: 43.1 kg 46.8 kg    Examination: General: thin, elderly female lying in bed in NAD  HEENT: MM pink/moist, anicteric, pupils equal / reactive  Neuro: Awake, alert, oriented to self, place, events, short term memory deficits, MAE, speech clear  CV: s1s2 irr irr, AF in 70's on monitor, no m/r/g PULM: non-labored on RA, lungs bilaterally clear  GI: soft, bsx4 hypoactive  Extremities: warm/dry, no edema  Skin: no rashes or lesions  Assessment & Plan:   GIB  Presumed lower, ? diverticular.  S/p 3u PRBC due to shocky vitals in ED. Possible transverse colon bleeding on CTA abd/pelvis. Bleeding study pending.  -follow in ICU -appreciate GI input, consulted per EDP / await further rec's  -monitor for further bleeding  -hold anticoagulation   ABLA  Thrombocytopenia  -transfuse for Hgb< 7% or recurrent bleeding  -follow H/H at noon and again in am if no further bleeding  -await findings from NM bleeding study  Hypokalemia -  monitor K, replacement infusing currently   AKI  ? pre-renal from hypovolemia. -Trend BMP / urinary output -Replace electrolytes as indicated -Avoid nephrotoxic agents, ensure adequate renal perfusion  Hx PAF, MI s/p PCI 2003, HTN, HLD, CAD s/p CABG 2006, CHF, AS s/p TAVR. On apixaban -supportive care -hold home apixaban, irbesartan,  isordil, lopressor, rosuvastatin   Hx bronchitis, asthma. -pulmicort BID  -PRN albuterol  -hold home flovent, ventolin  Hx CVA. -supportive care  -early PT efforts   Best Practice:  Diet: NPO. Pain/Anxiety/Delirium protocol (if indicated): N/A. VAP protocol (if indicated): N/A. DVT prophylaxis: SCD's only. GI prophylaxis: N/A. Glucose control: SSI if glucose consistently > 180. Mobility: Bedrest. Last date of multidisciplinary goals of care discussion: None. Family and staff present: N/A. Summary of discussion: N/A. Follow up goals of care discussion due: 12/7. Code Status: Full. Family Communication: Daughter, [name], called for update 12/2 on plan of care.  Disposition: ICU.  Labs   CBC: Recent Labs  Lab 04/22/20 1736 04/22/20 1746 04/22/20 1912 04/23/20 0201  WBC 11.9*  --  10.1 8.8  NEUTROABS 9.6*  --   --   --   HGB 11.1* 11.2* 11.3* 15.2*  HCT 33.7* 33.0* 32.8* 43.2  MCV 91.3  --  89.1 87.4  PLT PLATELET CLUMPS NOTED ON SMEAR, UNABLE TO ESTIMATE  --  80* 68*   Basic Metabolic Panel: Recent Labs  Lab 04/22/20 1736 04/22/20 1746 04/23/20 0201 04/23/20 0736  NA 135 135 139 140  K 3.2* 3.2* 3.2* 3.5  CL 102 101 106 108  CO2 22  --  20* 21*  GLUCOSE 112* 103* 85 73  BUN 20 23 13 12   CREATININE 1.25* 1.20* 0.98 0.92  CALCIUM 8.8*  --  9.0 9.2  MG  --   --  1.4*  --   PHOS  --   --  2.5  --    GFR: Estimated Creatinine Clearance: 30.6 mL/min (by C-G formula based on SCr of 0.92 mg/dL). Recent Labs  Lab 04/22/20 1736 04/22/20 1740 04/22/20 1912 04/22/20 2027 04/23/20 0201  WBC 11.9*  --  10.1  --  8.8  LATICACIDVEN  --  1.6  --  1.3  --    Liver Function Tests: Recent Labs  Lab 04/22/20 1736 04/23/20 0736  AST 26 29  ALT 17 21  ALKPHOS 62 60  BILITOT 0.7 1.1  PROT 5.2* 4.9*  ALBUMIN 3.1* 2.8*   Recent Labs  Lab 04/22/20 1736  LIPASE 22   No results for input(s): AMMONIA in the last 168 hours. ABG    Component Value Date/Time     PHART 7.442 01/03/2017 1029   PCO2ART 42.6 01/03/2017 1029   PO2ART 168.0 (H) 01/03/2017 1029   HCO3 29.5 (H) 01/03/2017 1029   TCO2 22 04/22/2020 1746   O2SAT 100.0 01/03/2017 1029    Coagulation Profile: Recent Labs  Lab 04/22/20 1755  INR 1.6*   Cardiac Enzymes: No results for input(s): CKTOTAL, CKMB, CKMBINDEX, TROPONINI in the last 168 hours. HbA1C: Hemoglobin A1C  Date/Time Value Ref Range Status  12/08/2015 08:03 AM 5.8  Final   Hgb A1c MFr Bld  Date/Time Value Ref Range Status  12/26/2016 02:24 PM 5.7 (H) 4.8 - 5.6 % Final    Comment:    (NOTE)         Pre-diabetes: 5.7 - 6.4         Diabetes: >6.4         Glycemic control for adults with  diabetes: <7.0    CBG: Recent Labs  Lab 04/22/20 1714 04/23/20 0306  GLUCAP 107* 78     Critical care time: 34 minutes    Noe Gens, MSN, NP-C, AGACNP-BC [facility] 04/23/2020, 10:00 AM   Please see [email or web address] for pager details.

## 2020-04-23 NOTE — Progress Notes (Signed)
K+ 3.2, Mg 1.4 Replaced per protocol

## 2020-04-23 NOTE — Consult Note (Signed)
Reason for Consult: GI bleed Referring Physician: CCM  [name] HPI: This is an 86 year old female with a PMH of CAD s/p CABG, Maze procedure, proxysmal afib on Eliquid admitted for Hematochezia and hypotension.  She states that she started to experience painless hematochezia for 1-2 days before presentation.  In the ER there was evidence of hematochezia and a CTA revealed a potential source for bleeding in the proximal transverse colon.  An uncomplicated sigmoid diverticulitis was identified as well.  Her HGB on admission was at 11.3 g/dL and she was transfused 3 units of PRBC.  Her HGB increased to 15.2 g/dL.  There is also evidence of a thrombocytopenia, but it is possible that she has platelet clumping.  Since her admission the patient denies any issues with hematochezia and she is feeling better.  Her Eliquis is being held.  Past Medical History:  Diagnosis Date  . Anxiety   . Aortic Stenosis s/p TAVR    Echo 10/21: EF 55-60, no RWMA, mild asymmetric LVH, normal RVSF, RVSP 40.2 mmHg, mild MR, s/p TAVR with mean gradient 8.35mmHg, no PVL  . Arthritis    OSTEO  . Aspirin allergy    on Plavix  . Asthma   . Bronchitis   . CAD (coronary artery disease)    a. s/p CABG 2006 with Cox-Maze procedure.  . Carotid artery disease ([place])    a. s/p R CEA.  . Chronic stable angina ([place])   . Diastolic dysfunction   . Eczema   . Fibromyalgia   . GERD (gastroesophageal reflux disease)   . Heart murmur   . Hiatal hernia   . Hyperlipidemia   . Hypertension   . Kyphoscoliosis   . Mitral regurgitation   . Myocardial infarction [facility]) 2003   Stent to CFX  . PAF (paroxysmal atrial fibrillation) ([place])    a. pt has h/o hematuria on Eliquis and has since refused anticoagulation  . Pneumonia 2015 ?  Marland Kitchen Pulmonary regurgitation   . Skin cancer of arm   . Stroke ([place])   . Tricuspid regurgitation     Past Surgical History:  Procedure Laterality Date  . ABDOMINAL HYSTERECTOMY  1976  . CAROTID  ENDARTERECTOMY  2010  . CORONARY ANGIOPLASTY WITH STENT PLACEMENT    . CORONARY ARTERY BYPASS GRAFT  01/2005   LIMA-D1; SVG-LAD; SVG-OM; SVG-PDA  . EYE SURGERY    . MULTIPLE EXTRACTIONS WITH ALVEOLOPLASTY  12/28/2016   Extraction of tooth #'s 2- 5,7-10, 12,13,17-20,and 22-29 with alveoloplasty and maxillary right and left lateral exostoses reductions  . MULTIPLE EXTRACTIONS WITH ALVEOLOPLASTY N/A 12/28/2016   Procedure: Extraction of tooth #'s 2- 5,7-10, 12,13,17-20,and 22-29 with alveoloplasty and maxillary right and left lateral exostoses reductions;  Surgeon: Lenn Cal, DDS;  Location: [place];  Service: Oral Surgery;  Laterality: N/A;  . RIGHT/LEFT HEART CATH AND CORONARY/GRAFT ANGIOGRAPHY N/A 10/27/2016   Procedure: Right/Left Heart Cath and Coronary/Graft Angiography;  Surgeon: Burnell Blanks, MD;  Location: [place] [facility];  Service: Cardiovascular;  Laterality: N/A;  . TEE WITHOUT CARDIOVERSION N/A 01/03/2017   Procedure: TRANSESOPHAGEAL ECHOCARDIOGRAM (TEE);  Surgeon: Burnell Blanks, MD;  Location: [place];  Service: Open Heart Surgery;  Laterality: N/A;  . TONSILLECTOMY    . TRANSCATHETER AORTIC VALVE REPLACEMENT, TRANSFEMORAL N/A 01/03/2017   Procedure: TRANSCATHETER AORTIC VALVE REPLACEMENT, TRANSFEMORAL;  Surgeon: Burnell Blanks, MD;  Location: [place];  Service: Open Heart Surgery;  Laterality: N/A;  . TUMOR REMOVAL  Family History  Problem Relation Age of Onset  . Heart failure Mother   . Other Father     Social History:  reports that she has never smoked. She quit smokeless tobacco use about 18 years ago.  Her smokeless tobacco use included snuff. She reports that she does not drink alcohol and does not use drugs.  Allergies:  Allergies  Allergen Reactions  . Other Other (See Comments)    NO ACIDIC, TART< OR SPICY FOODS- DEVELOPS REFLUX OFTEN!!  PATIENT HAS TROUBLE SWALLOWING TABLETS!!  . Bactrim [Sulfamethoxazole-Trimethoprim] Other  (See Comments)    "makes her feel funny" or "unsteady"   . Tape Other (See Comments)    SKIN IS VERY THIN- WILL TEAR EASILY!!  . Amitriptyline Hcl Rash  . Aspirin Hives, Swelling, Rash and Other (See Comments)    Body became swollen  . Zetia [Ezetimibe] Rash    Medications:  Scheduled: . budesonide  0.5 mg Inhalation BID  . Chlorhexidine Gluconate Cloth  6 each Topical Daily   Continuous: . sodium chloride      Results for orders placed or performed during the hospital encounter of 04/22/20 (from the past 24 hour(s))  CBG monitoring, ED     Status: Abnormal   Collection Time: 04/22/20  5:14 PM  Result Value Ref Range   Glucose-Capillary 107 (H) 70 - 99 mg/dL   Comment 1 Notify RN    Comment 2 Document in Chart   CBC with Differential     Status: Abnormal   Collection Time: 04/22/20  5:36 PM  Result Value Ref Range   WBC 11.9 (H) 4.0 - 10.5 K/uL   RBC 3.69 (L) 3.87 - 5.11 MIL/uL   Hemoglobin 11.1 (L) 12.0 - 15.0 g/dL   HCT 33.7 (L) 36 - 46 %   MCV 91.3 80.0 - 100.0 fL   MCH 30.1 26.0 - 34.0 pg   MCHC 32.9 30.0 - 36.0 g/dL   RDW 13.6 11.5 - 15.5 %   Platelets PLATELET CLUMPS NOTED ON SMEAR, UNABLE TO ESTIMATE 150 - 400 K/uL   nRBC 0.0 0.0 - 0.2 %   Neutrophils Relative % 80 %   Neutro Abs 9.6 (H) 1.7 - 7.7 K/uL   Lymphocytes Relative 12 %   Lymphs Abs 1.4 0.7 - 4.0 K/uL   Monocytes Relative 7 %   Monocytes Absolute 0.8 0.1 - 1.0 K/uL   Eosinophils Relative 0 %   Eosinophils Absolute 0.0 0.0 - 0.5 K/uL   Basophils Relative 0 %   Basophils Absolute 0.0 0.0 - 0.1 K/uL   Immature Granulocytes 1 %   Abs Immature Granulocytes 0.08 (H) 0.00 - 0.07 K/uL  Comprehensive metabolic panel     Status: Abnormal   Collection Time: 04/22/20  5:36 PM  Result Value Ref Range   Sodium 135 135 - 145 mmol/L   Potassium 3.2 (L) 3.5 - 5.1 mmol/L   Chloride 102 98 - 111 mmol/L   CO2 22 22 - 32 mmol/L   Glucose, Bld 112 (H) 70 - 99 mg/dL   BUN 20 8 - 23 mg/dL   Creatinine, Ser 1.25  (H) 0.44 - 1.00 mg/dL   Calcium 8.8 (L) 8.9 - 10.3 mg/dL   Total Protein 5.2 (L) 6.5 - 8.1 g/dL   Albumin 3.1 (L) 3.5 - 5.0 g/dL   AST 26 15 - 41 U/L   ALT 17 0 - 44 U/L   Alkaline Phosphatase 62 38 - 126 U/L   Total Bilirubin 0.7  0.3 - 1.2 mg/dL   GFR, Estimated 41 (L) >60 mL/min   Anion gap 11 5 - 15  Lipase, blood     Status: None   Collection Time: 04/22/20  5:36 PM  Result Value Ref Range   Lipase 22 11 - 51 U/L  Type and screen [place]     Status: None   Collection Time: 04/22/20  5:40 PM  Result Value Ref Range   ABO/RH(D) A POS    Antibody Screen NEG    Sample Expiration 04/25/2020,2359    Unit Number [id number]    Blood Component Type RED CELLS,LR    Unit division 00    Status of Unit ISSUED,FINAL    Unit tag comment VERBAL ORDERS PER DR CURATOLO    Transfusion Status OK TO TRANSFUSE    Crossmatch Result COMPATIBLE    Unit Number [id number]    Blood Component Type RED CELLS,LR    Unit division 00    Status of Unit ISSUED,FINAL    Transfusion Status OK TO TRANSFUSE    Crossmatch Result      Compatible Performed at [facility], [place] [address], [place], [place] [address]    Unit Number [id number]    Blood Component Type RBC LR PHER1    Unit division 00    Status of Unit ISSUED,FINAL    Transfusion Status OK TO TRANSFUSE    Crossmatch Result Compatible   Lactic acid, plasma     Status: None   Collection Time: 04/22/20  5:40 PM  Result Value Ref Range   Lactic Acid, Venous 1.6 0.5 - 1.9 mmol/L  Prepare RBC (crossmatch)     Status: None   Collection Time: 04/22/20  5:40 PM  Result Value Ref Range   Order Confirmation      ORDER PROCESSED BY BLOOD BANK Performed at [facility], [place] [address] [place], [place] [address]   POC occult blood, ED Provider will collect     Status: Abnormal   Collection Time: 04/22/20  5:42 PM  Result Value Ref Range   Fecal Occult Bld POSITIVE (A) NEGATIVE  I-stat chem 8, ED (not at [facility]  or [facility])     Status: Abnormal   Collection Time: 04/22/20  5:46 PM  Result Value Ref Range   Sodium 135 135 - 145 mmol/L   Potassium 3.2 (L) 3.5 - 5.1 mmol/L   Chloride 101 98 - 111 mmol/L   BUN 23 8 - 23 mg/dL   Creatinine, Ser 1.20 (H) 0.44 - 1.00 mg/dL   Glucose, Bld 103 (H) 70 - 99 mg/dL   Calcium, Ion 1.22 1.15 - 1.40 mmol/L   TCO2 22 22 - 32 mmol/L   Hemoglobin 11.2 (L) 12.0 - 15.0 g/dL   HCT 33.0 (L) 36 - 46 %  Protime-INR     Status: Abnormal   Collection Time: 04/22/20  5:55 PM  Result Value Ref Range   Prothrombin Time 18.2 (H) 11.4 - 15.2 seconds   INR 1.6 (H) 0.8 - 1.2  Resp Panel by RT-PCR (Flu A&B, Covid) Nasopharyngeal Swab     Status: None   Collection Time: 04/22/20  6:27 PM   Specimen: Nasopharyngeal Swab; Nasopharyngeal(NP) swabs in vial transport medium  Result Value Ref Range   SARS Coronavirus 2 by RT PCR NEGATIVE NEGATIVE   Influenza A by PCR NEGATIVE NEGATIVE   Influenza B by PCR NEGATIVE NEGATIVE  Prepare RBC (crossmatch)     Status: None  Collection Time: 04/22/20  6:51 PM  Result Value Ref Range   Order Confirmation      ORDER PROCESSED BY BLOOD BANK Performed at [facility], [place] [address], [place], [place] [address]   Prepare RBC (crossmatch)     Status: None   Collection Time: 04/22/20  7:11 PM  Result Value Ref Range   Order Confirmation      ORDER PROCESSED BY BLOOD BANK Performed at [facility], [place] [address] [place], [place] [address]   CBC     Status: Abnormal   Collection Time: 04/22/20  7:12 PM  Result Value Ref Range   WBC 10.1 4.0 - 10.5 K/uL   RBC 3.68 (L) 3.87 - 5.11 MIL/uL   Hemoglobin 11.3 (L) 12.0 - 15.0 g/dL   HCT 32.8 (L) 36 - 46 %   MCV 89.1 80.0 - 100.0 fL   MCH 30.7 26.0 - 34.0 pg   MCHC 34.5 30.0 - 36.0 g/dL   RDW 13.4 11.5 - 15.5 %   Platelets 80 (L) 150 - 400 K/uL   nRBC 0.0 0.0 - 0.2 %  Lactic acid, plasma     Status: None   Collection Time: 04/22/20  8:27 PM  Result Value Ref Range   Lactic  Acid, Venous 1.3 0.5 - 1.9 mmol/L  Blood culture (routine x 2)     Status: None (Preliminary result)   Collection Time: 04/22/20  8:27 PM   Specimen: BLOOD LEFT HAND  Result Value Ref Range   Specimen Description BLOOD LEFT HAND    Special Requests AEROBIC BOTTLE ONLY Blood Culture adequate volume    Culture      NO GROWTH < 12 HOURS Performed at [facility], [address], [place], [place] [address]    Report Status PENDING   Troponin I (High Sensitivity)     Status: Abnormal   Collection Time: 04/22/20  8:27 PM  Result Value Ref Range   Troponin I (High Sensitivity) 51 (H) <18 ng/L  Blood culture (routine x 2)     Status: None (Preliminary result)   Collection Time: 04/22/20  8:37 PM   Specimen: BLOOD  Result Value Ref Range   Specimen Description BLOOD LEFT ANTECUBITAL    Special Requests      BOTTLES DRAWN AEROBIC AND ANAEROBIC Blood Culture adequate volume   Culture      NO GROWTH < 12 HOURS Performed at [place] [address], [place], [place] [address]    Report Status PENDING   Glucose, capillary     Status: None   Collection Time: 04/22/20 10:28 PM  Result Value Ref Range   Glucose-Capillary 90 70 - 99 mg/dL  MRSA PCR Screening     Status: None   Collection Time: 04/22/20 11:20 PM   Specimen: Nasopharyngeal  Result Value Ref Range   MRSA by PCR NEGATIVE NEGATIVE  CBC     Status: Abnormal   Collection Time: 04/23/20  2:01 AM  Result Value Ref Range   WBC 8.8 4.0 - 10.5 K/uL   RBC 4.94 3.87 - 5.11 MIL/uL   Hemoglobin 15.2 (H) 12.0 - 15.0 g/dL   HCT 43.2 36 - 46 %   MCV 87.4 80.0 - 100.0 fL   MCH 30.8 26.0 - 34.0 pg   MCHC 35.2 30.0 - 36.0 g/dL   RDW 13.6 11.5 - 15.5 %   Platelets 68 (L) 150 - 400 K/uL   nRBC 0.0  0.0 - 0.2 %  Basic metabolic panel     Status: Abnormal   Collection Time: 04/23/20  2:01 AM  Result Value Ref Range   Sodium 139 135 - 145 mmol/L   Potassium 3.2 (L) 3.5 - 5.1 mmol/L   Chloride 106 98 - 111 mmol/L   CO2 20  (L) 22 - 32 mmol/L   Glucose, Bld 85 70 - 99 mg/dL   BUN 13 8 - 23 mg/dL   Creatinine, Ser 0.98 0.44 - 1.00 mg/dL   Calcium 9.0 8.9 - 10.3 mg/dL   GFR, Estimated 55 (L) >60 mL/min   Anion gap 13 5 - 15  Magnesium     Status: Abnormal   Collection Time: 04/23/20  2:01 AM  Result Value Ref Range   Magnesium 1.4 (L) 1.7 - 2.4 mg/dL  Phosphorus     Status: None   Collection Time: 04/23/20  2:01 AM  Result Value Ref Range   Phosphorus 2.5 2.5 - 4.6 mg/dL  Troponin I (High Sensitivity)     Status: Abnormal   Collection Time: 04/23/20  2:01 AM  Result Value Ref Range   Troponin I (High Sensitivity) 57 (H) <18 ng/L  Glucose, capillary     Status: None   Collection Time: 04/23/20  3:06 AM  Result Value Ref Range   Glucose-Capillary 78 70 - 99 mg/dL  Comprehensive metabolic panel     Status: Abnormal   Collection Time: 04/23/20  7:36 AM  Result Value Ref Range   Sodium 140 135 - 145 mmol/L   Potassium 3.5 3.5 - 5.1 mmol/L   Chloride 108 98 - 111 mmol/L   CO2 21 (L) 22 - 32 mmol/L   Glucose, Bld 73 70 - 99 mg/dL   BUN 12 8 - 23 mg/dL   Creatinine, Ser 0.92 0.44 - 1.00 mg/dL   Calcium 9.2 8.9 - 10.3 mg/dL   Total Protein 4.9 (L) 6.5 - 8.1 g/dL   Albumin 2.8 (L) 3.5 - 5.0 g/dL   AST 29 15 - 41 U/L   ALT 21 0 - 44 U/L   Alkaline Phosphatase 60 38 - 126 U/L   Total Bilirubin 1.1 0.3 - 1.2 mg/dL   GFR, Estimated 60 (L) >60 mL/min   Anion gap 11 5 - 15  BLOOD TRANSFUSION REPORT - SCANNED     Status: None   Collection Time: 04/23/20 10:10 AM   Narrative   Ordered by an unspecified provider.  CBC     Status: Abnormal   Collection Time: 04/23/20 12:24 PM  Result Value Ref Range   WBC 9.6 4.0 - 10.5 K/uL   RBC 5.45 (H) 3.87 - 5.11 MIL/uL   Hemoglobin 16.8 (H) 12.0 - 15.0 g/dL   HCT 47.1 (H) 36 - 46 %   MCV 86.4 80.0 - 100.0 fL   MCH 30.8 26.0 - 34.0 pg   MCHC 35.7 30.0 - 36.0 g/dL   RDW 14.2 11.5 - 15.5 %   Platelets 73 (L) 150 - 400 K/uL   nRBC 0.0 0.0 - 0.2 %     DG Chest  Port 1 View  Result Date: 04/23/2020 CLINICAL DATA:  Respiratory failure. EXAM: PORTABLE CHEST 1 VIEW COMPARISON:  04/22/2020. CT report 11/01/2016. FINDINGS: Prior CABG. Prior cardiac valve replacement. Heart size stable. Biapical pleural-parenchymal thickening again noted consistent scarring. Stable right base pleural thickening consistent with scarring. No acute infiltrate. Calcified nodular density again noted the right lung base consistent calcified granuloma. No pleural effusion  or pneumothorax. Degenerative changes scoliosis thoracic spine. IMPRESSION: 1. Prior CABG. Prior cardiac valve replacement. Heart size stable. 2. Biapical pleural-parenchymal thickening consistent scarring again noted. Stable right base pleural thickening consistent scarring. No acute pulmonary disease. Electronically Signed   By: Marcello Moores  Register   On: 04/23/2020 06:13   DG Chest Portable 1 View  Result Date: 04/22/2020 CLINICAL DATA:  86 year old female with hypertension. EXAM: PORTABLE CHEST 1 VIEW COMPARISON:  Chest radiograph dated 01/03/2017. FINDINGS: Minimal left lung base atelectasis. A small left pleural effusion may be present. The right lung is clear. Probable small calcified right lower lobe granuloma. No focal consolidation or pneumothorax. The cardiac silhouette is within limits. Median sternotomy wires and CABG vascular clips. Aortic valve repair. Atherosclerotic calcification of the aorta. No acute osseous pathology. IMPRESSION: Minimal left lung base atelectasis. A small left pleural effusion may be present. Electronically Signed   By: Anner Crete M.D.   On: 04/22/2020 18:13   CT Angio Abd/Pel W and/or Wo Contrast  Addendum Date: 04/22/2020   ADDENDUM REPORT: 04/22/2020 22:05 ADDENDUM: Please note the high density content seen in the proximal transverse colon on arterial phase does not appear to pool on the delayed venous phase and may represent an artifact. A GI bleed nuclear scan may provide better  evaluation. There is long segment thickening of the distal descending, and sigmoid colon which may be related to muscular hypertrophy secondary to chronic inflammation. There is however some perisigmoid stranding and probable mild edema of the distal colonic wall which may represent mild colitis or diverticulitis. Clinical correlation is recommended. No abscess or perforation. Electronically Signed   By: Anner Crete M.D.   On: 04/22/2020 22:05   Result Date: 04/22/2020 CLINICAL DATA:  87 year old female with hypotension. Rectal bleed. EXAM: CTA ABDOMEN AND PELVIS WITHOUT AND WITH CONTRAST TECHNIQUE: Multidetector CT imaging of the abdomen and pelvis was performed using the standard protocol during bolus administration of intravenous contrast. Multiplanar reconstructed images and MIPs were obtained and reviewed to evaluate the vascular anatomy. CONTRAST:  11mL OMNIPAQUE IOHEXOL 350 MG/ML SOLN COMPARISON:  CT dated 11/01/2016. FINDINGS: VASCULAR Aorta: Advanced atherosclerotic calcification of the abdominal aorta. The aorta is ectatic measuring up to 2.3 cm in diameter. No aneurysmal dilatation or dissection. No periaortic fluid collection or edema. Celiac: The celiac axis and its major branches are patent. SMA: The SMA is patent. Renals: Atherosclerotic calcification of the origins of the renal arteries. The renal arteries are patent. IMA: The IMA is patent. Inflow: Advanced atherosclerotic calcification of the iliac arteries. No aneurysmal dilatation or dissection. Proximal Outflow: Atherosclerotic calcification. No acute findings. Veins: The IVC is unremarkable. The SMV, splenic vein, and main portal vein are patent. No portal venous gas. Review of the MIP images confirms the above findings. NON-VASCULAR Lower chest: The visualized lung bases are clear. There is eventration of the left hemidiaphragm. No intra-abdominal free air or free fluid. Hepatobiliary: The liver is unremarkable. No intrahepatic biliary  dilatation. The gallbladder is distended. There are multiple stones within the gallbladder. No pericholecystic fluid or evidence of acute cholecystitis. Multiple small stones noted in the common bile duct with combined length of approximately 1 cm (62/15). Pancreas: Unremarkable. No pancreatic ductal dilatation or surrounding inflammatory changes. Spleen: Normal in size without focal abnormality. Adrenals/Urinary Tract: The adrenal glands unremarkable. There is a 3 mm nonobstructing right renal inferior pole calculus. No hydronephrosis. There is no hydronephrosis or nephrolithiasis on the left. There is a 5 cm right renal upper pole cyst.  The visualized ureters and urinary bladder appear unremarkable. Stomach/Bowel: There is sigmoid diverticulosis with segmental muscular hypertrophy. There is perisigmoid stranding consistent with acute diverticulitis. No diverticular abscess or perforation. There is contrast within the lumen of the proximal transverse colon (73/12, 73/19, and 42/15) not present on precontrast images concerning for active GI bleed. Evaluation of the bowel however is limited due to respiratory motion artifact. There is no bowel obstruction. The appendix is not visualized with certainty. No inflammatory changes identified in the right lower quadrant. Lymphatic: No adenopathy. Reproductive: Hysterectomy. Other: None Musculoskeletal: Osteopenia with scoliosis and degenerative changes of the spine. No acute osseous pathology. IMPRESSION: 1. Findings concerning for active GI bleed involving the proximal transverse colon. Clinical correlation is recommended. A GI bleed nuclear scan may provide additional information. 2. Sigmoid diverticulitis. No diverticular abscess or perforation. 3. Cholelithiasis and choledocholithiasis. No evidence of acute cholecystitis. 4. A 3 mm nonobstructing right renal inferior pole calculus. No hydronephrosis. Electronically Signed: By: Anner Crete M.D. On: 04/22/2020 21:25     ROS:  As stated above in the HPI otherwise negative.  Blood pressure (!) 141/68, pulse 74, temperature 98.1 F (36.7 C), temperature source Oral, resp. rate (!) 21, height 5\' 2"  (1.575 m), weight 46.8 kg, SpO2 95 %.    PE: Gen: NAD, Alert and Oriented, thin, frail appearing HEENT:  [place]/AT, EOMI Neck: Supple, no LAD Lungs: CTA Bilaterally CV: RRR without M/G/R ABD: Soft, NTND, +BS Ext: No C/C/E  Assessment/Plan: 1) Probable diverticular bleed. 2) Sigmoid colon diverticulitis. 3) Improving hypotension. 4) Thrombocytopenia versus platelet clumping.   The patient is stable.  She denies any issues with further bleeding and she does not complain about any abdominal pain, even though there is evidence of a sigmoid colon diverticulitis.  She is hemodynamically stable.  Plan: 1) Agree with tagged bleeding scan if she rebleeds. 2) A colonoscopy is not preferable with her age and overall frail status. 3) Follow HGB and transfuse as necessary. 4) Advance to a clear liquid diet.  Kymoni Lesperance D 04/23/2020, 2:58 PM

## 2020-04-24 DIAGNOSIS — E162 Hypoglycemia, unspecified: Secondary | ICD-10-CM | POA: Diagnosis not present

## 2020-04-24 DIAGNOSIS — K922 Gastrointestinal hemorrhage, unspecified: Secondary | ICD-10-CM | POA: Diagnosis not present

## 2020-04-24 DIAGNOSIS — I2510 Atherosclerotic heart disease of native coronary artery without angina pectoris: Secondary | ICD-10-CM

## 2020-04-24 LAB — COMPREHENSIVE METABOLIC PANEL
ALT: 19 U/L (ref 0–44)
AST: 24 U/L (ref 15–41)
Albumin: 2.7 g/dL — ABNORMAL LOW (ref 3.5–5.0)
Alkaline Phosphatase: 61 U/L (ref 38–126)
Anion gap: 10 (ref 5–15)
BUN: 8 mg/dL (ref 8–23)
CO2: 21 mmol/L — ABNORMAL LOW (ref 22–32)
Calcium: 8.7 mg/dL — ABNORMAL LOW (ref 8.9–10.3)
Chloride: 106 mmol/L (ref 98–111)
Creatinine, Ser: 0.86 mg/dL (ref 0.44–1.00)
GFR, Estimated: >60 mL/min (ref >60)
Glucose, Bld: 97 mg/dL (ref 70–99)
Potassium: 3.6 mmol/L (ref 3.5–5.1)
Sodium: 137 mmol/L (ref 135–145)
Total Bilirubin: 1.1 mg/dL (ref 0.3–1.2)
Total Protein: 4.9 g/dL — ABNORMAL LOW (ref 6.5–8.1)

## 2020-04-24 LAB — CBC
HCT: 45.3 % (ref 36.0–46.0)
Hemoglobin: 15.6 g/dL — ABNORMAL HIGH (ref 12.0–15.0)
MCH: 30.5 pg (ref 26.0–34.0)
MCHC: 34.4 g/dL (ref 30.0–36.0)
MCV: 88.6 fL (ref 80.0–100.0)
Platelets: 74 10*3/uL — ABNORMAL LOW (ref 150–400)
RBC: 5.11 MIL/uL (ref 3.87–5.11)
RDW: 14.4 % (ref 11.5–15.5)
WBC: 7.9 10*3/uL (ref 4.0–10.5)
nRBC: 0 % (ref 0.0–0.2)

## 2020-04-24 LAB — GLUCOSE, CAPILLARY
Glucose-Capillary: 138 mg/dL — ABNORMAL HIGH (ref 70–99)
Glucose-Capillary: 51 mg/dL — ABNORMAL LOW (ref 70–99)
Glucose-Capillary: 89 mg/dL (ref 70–99)
Glucose-Capillary: 132 mg/dL — ABNORMAL HIGH (ref 70–99)
Glucose-Capillary: 106 mg/dL — ABNORMAL HIGH (ref 70–99)
Glucose-Capillary: 107 mg/dL — ABNORMAL HIGH (ref 70–99)
Glucose-Capillary: 109 mg/dL — ABNORMAL HIGH (ref 70–99)

## 2020-04-24 MED ORDER — DEXTROSE 10 % IV SOLN: INTRAVENOUS | Status: DC

## 2020-04-24 MED ORDER — PANTOPRAZOLE SODIUM 40 MG PO TBEC
40.0000 mg | DELAYED_RELEASE_TABLET | Freq: Two times a day (BID) | ORAL | Status: DC
  Administered 2020-04-24 – 2020-04-27 (×7): 40 mg via ORAL
  Filled 2020-04-24 (×7): qty 1

## 2020-04-24 MED ORDER — ROSUVASTATIN CALCIUM 20 MG PO TABS
40.0000 mg | ORAL_TABLET | Freq: Every evening | ORAL | Status: DC
  Administered 2020-04-24 – 2020-04-26 (×3): 40 mg via ORAL
  Filled 2020-04-24 (×3): qty 2

## 2020-04-24 MED ORDER — ISOSORBIDE DINITRATE 10 MG PO TABS
30.0000 mg | ORAL_TABLET | Freq: Two times a day (BID) | ORAL | Status: DC
  Administered 2020-04-24 – 2020-04-25 (×3): 30 mg via ORAL
  Filled 2020-04-24: qty 3
  Filled 2020-04-24: qty 1
  Filled 2020-04-24: qty 3
  Filled 2020-04-24: qty 1

## 2020-04-24 NOTE — Progress Notes (Signed)
eLink Physician-Brief Progress Note Patient Name: [name] DOB: [birth year] MRN: [id number]   Date of Service  04/24/2020  HPI/Events of Note  Hypoglycemia - Blood glucose = 44 and 51.Patient not eating or drinking much. Not on insulin.   eICU Interventions  Plan: 1. D10W to run IV at 40 mL/hour.     Intervention Category Major Interventions: Other:  Breylin Dom Cornelia Copa 04/24/2020, 3:50 AM

## 2020-04-24 NOTE — Evaluation (Signed)
Physical Therapy Evaluation Patient Details Name: [name] MRN: [id number] DOB: 06/16/1928 Today's Date: 04/24/2020   History of Present Illness  86 year old woman admitted with weakness and GI bleed and shock. Pt with history of CAD/CABG, MR, Maze procedure, GERD with esophagitis, paroxysmal atrial fibrillation on Eliquis 2.5 mg twice daily.    Clinical Impression  Pt was found in bed with daughter at bedside. Daughter lives at home with pt. Pt was I with use of RW prior to admission, but now requires increased assistance due to deficits in strength, balance and coordination. Pt able to fully participate in session with VSS throughout. Pt was limited due to weakness and overall fear of falling. Pt will benefit from skilled PT to address deficits in balance, strength, coordination, gait, endurance and safety to maximize independence with functional mobility prior to discharge.     Follow Up Recommendations Home health PT;Supervision/Assistance - 24 hour vs SNF (pending progress with PT)    Equipment Recommendations  None recommended by PT    Recommendations for Other Services       Precautions / Restrictions Precautions Precautions: Fall      Mobility  Bed Mobility Overal bed mobility: Needs Assistance Bed Mobility: Supine to Sit;Sit to Supine     Supine to sit: HOB elevated;Min assist Sit to supine: HOB elevated;Supervision        Transfers Overall transfer level: Needs assistance Equipment used: 1 person hand held assist Transfers: Sit to/from Stand Sit to Stand: Mod assist;Min assist         General transfer comment: performed sit<>stand x 2 trials mod first trial and min second trial; performed side stepping to [facility] with mod A needed. pt with increased posterior lean and forward flexed posture due to scoliosis  Ambulation/Gait                Stairs            Wheelchair Mobility    Modified Rankin (Stroke Patients Only)       Balance  Overall balance assessment: Needs assistance Sitting-balance support: Single extremity supported Sitting balance-[name] Scale: Fair   Postural control: Posterior lean Standing balance support: Bilateral upper extremity supported Standing balance-[name] Scale: Poor                               Pertinent Vitals/Pain Pain Assessment: No/denies pain    Home Living Family/patient expects to be discharged to:: Private residence Living Arrangements: Children Available Help at Discharge: Family;Available 24 hours/day Type of Home: House Home Access: Stairs to enter Entrance Stairs-Rails: Right;Left;Can reach both Entrance Stairs-Number of Steps: 3 Home Layout: One level Home Equipment: Walker - 2 wheels;Bedside commode;Transport chair Additional Comments: pt doesn't go into the bathroom, she uses her BSC and does sponge baths. Daughter helps wtih bathing and provides S for stairs. Pt uses transport chair when out of the house    Prior Function Level of Independence: Independent with assistive device(s)               Hand Dominance   Dominant Hand: Right    Extremity/Trunk Assessment   Upper Extremity Assessment Upper Extremity Assessment: Defer to OT evaluation    Lower Extremity Assessment Lower Extremity Assessment: Generalized weakness       Communication   Communication: HOH  Cognition Arousal/Alertness: Awake/alert Behavior During Therapy: WFL for tasks assessed/performed Overall Cognitive Status: Within Functional Limits for tasks assessed  General Comments: pt with increased conversation, dificut to redirect      General Comments General comments (skin integrity, edema, etc.): VSS throughout session    Exercises     Assessment/Plan    PT Assessment Patient needs continued PT services  PT Problem List Decreased strength;Decreased mobility;Decreased safety awareness;Decreased  coordination;Decreased activity tolerance;Decreased balance       PT Treatment Interventions Therapeutic exercise;Gait training;Balance training;Functional mobility training;Therapeutic activities;Patient/family education    PT Goals (Current goals can be found in the Care Plan section)  Acute Rehab PT Goals Patient Stated Goal: I dont want to eat and throw up PT Goal Formulation: With patient Time For Goal Achievement: 05/08/20 Potential to Achieve Goals: Good    Frequency Min 3X/week   Barriers to discharge        [name]               AM-PAC PT "6 Clicks" Mobility  Outcome Measure Help needed turning from your back to your side while in a flat bed without using bedrails?: None Help needed moving from lying on your back to sitting on the side of a flat bed without using bedrails?: A Little Help needed moving to and from a bed to a chair (including a wheelchair)?: A Little Help needed standing up from a chair using your arms (e.g., wheelchair or bedside chair)?: A Lot Help needed to walk in hospital room?: A Lot Help needed climbing 3-5 steps with a railing? : A Lot 6 Click Score: 16    End of Session Equipment Utilized During Treatment: Gait belt Activity Tolerance: Patient tolerated treatment well Patient left: with call bell/phone within reach;with family/visitor present Nurse Communication: Mobility status PT Visit Diagnosis: Unsteadiness on feet (R26.81);Other abnormalities of gait and mobility (R26.89);Muscle weakness (generalized) (M62.81)    Time: 3295-1884 PT Time Calculation (min) (ACUTE ONLY): 32 min   Charges:   PT Evaluation $PT Eval Low Complexity: 1 Low PT Treatments $Therapeutic Activity: 8-22 mins        [name], DPT Acute Rehabilitation Services [phone]  [name] 04/24/2020, 1:16 PM

## 2020-04-24 NOTE — Progress Notes (Signed)
Pt. And her daughter are interested in speaking with someone from social work. They are also curious as to if the Pt. Is able to be seen by PT/OT.

## 2020-04-24 NOTE — Progress Notes (Signed)
[facility] PROGRESS NOTE   [name] XYV:[id number] DOB: 12-06-1928 DOA: 04/22/2020  PCP: Denita Lung, MD  Brief History/Interval Summary: 86 year old woman with history of CAD/CABG, MR, Maze procedure, GERD with esophagitis, paroxysmal atrial fibrillation on Eliquis 2.5 mg twice daily.  Admitted with weakness and fatigue for 3 days, found to have shock on presentation.  She reported some hematochezia at home before presentation.  Concern was for significant GI bleed however patient did not have a significant drop in her hemoglobin.  It was felt that some of her home medications may have contributed to her low blood pressure.   Consultants: Gastroenterology  Procedures: None yet  Antibiotics: Anti-infectives (From admission, onward)   None      Subjective/Interval History: Patient noted to be somewhat distracted this morning but denies any abdominal pain.  No further episodes of bleeding reported by nursing staff.  Patient denies any shortness of breath.     Assessment/Plan:  Shock Initially thought to be due to acute GI bleeding although it is not certain if that was indeed the reason for her low blood pressure.  Could have been due to overmedication.  Patient noted to be on irbesartan, Sorto, metoprolol at home.  Unclear if she is taking her medications appropriately although she mentions that her daughter helps with her medications. Pressure has stabilized and now in the hypertensive range.  We will start reintroducing her home medications gradually.  GI bleed Noted to have hematochezia.  Could be diverticular.  She was given 3 units since her blood pressure was low in the emergency department.  Possible transverse colon bleeding noted on CT angiogram.  Gastroenterology is following.  Continue to hold anticoagulation.  She is on apixaban.  Hemoglobin noted to be stable.  No further occurrence of GI bleeding noted.  Hypoglycemia Likely due to n.p.o. status.   Started on D10 infusion overnight.  Diet was advanced.  CBGs have stabilized.  Should be able to discontinue D10 today.  Concern for sigmoid diverticulitis Noted to be asymptomatic.  No abdominal tenderness appreciated.  Holding off on antibiotics for now.  Acute blood loss anemia/thrombocytopenia Hemoglobin is stable.  Reason for low platelet count is not entirely clear.  Counts are stable.  Continue to monitor.  Hypokalemia Noted to be 3.6 this morning.  Continue to monitor periodically  Acute kidney injury Renal function back to baseline.  Monitor urine output.  History of paroxysmal atrial fibrillation On apixaban which is currently on hold.  History of coronary artery disease status post PCI in 2003 status post CABG in 2006 Stable currently.  No chest pain.  History of aortic stenosis status post TAVR Stable.  Hyperlipidemia May resume statin.  History of bronchial asthma Stable.  Continue inhalers.  History of stroke Stable.  PT and OT evaluations.  Pressure injury Pressure Injury 04/22/20 Sacrum Medial Deep Tissue Pressure Injury - Purple or maroon localized area of discolored intact skin or blood-filled blister due to damage of underlying soft tissue from pressure and/or shear. (Active)  04/22/20 2230  Location: Sacrum  Location Orientation: Medial  Staging: Deep Tissue Pressure Injury - Purple or maroon localized area of discolored intact skin or blood-filled blister due to damage of underlying soft tissue from pressure and/or shear.  Wound Description (Comments):   Present on Admission: Yes     DVT Prophylaxis: SCDs Code Status: Full code Family Communication: We will update daughter Disposition Plan: PT and OT evaluation.  Hopefully return home when stable.  Status  is: Inpatient  Remains inpatient appropriate because:IV treatments appropriate due to intensity of illness or inability to take PO and Inpatient level of care appropriate due to severity of  illness   Dispo: The patient is from: Home              Anticipated d/c is to: Home              Anticipated d/c date is: 2 days              Patient currently is not medically stable to d/c.       Medications:  Scheduled: . budesonide  0.5 mg Inhalation BID  . Chlorhexidine Gluconate Cloth  6 each Topical Daily   Continuous: . sodium chloride     TGG:[id number]   Objective:  Vital Signs  Vitals:   04/24/20 0500 04/24/20 0600 04/24/20 0700 04/24/20 0755  BP: (!) 144/63 (!) 154/73 (!) 164/87   Pulse: 81 81 79   Resp: 17 18 (!) 22   Temp:    98.1 F (36.7 C)  TempSrc:    Oral  SpO2: 95% 96% 95%   Weight:      Height:        Intake/Output Summary (Last 24 hours) at 04/24/2020 1206 Last data filed at 04/24/2020 1000 Gross per 24 hour  Intake 326.49 ml  Output 600 ml  Net -273.51 ml   Filed Weights   04/22/20 1816 04/23/20 0452 04/24/20 0334  Weight: 43.1 kg 46.8 kg 49.1 kg    General appearance: Awake alert.  In no distress Resp: Clear to auscultation bilaterally.  Normal effort Cardio: S1-S2 is normal regular.  No S3-S4.  No rubs murmurs or bruit GI: Abdomen is soft.  Nontender nondistended.  Bowel sounds are present normal.  No masses organomegaly Extremities: No edema.  Able to move her extremities Neurologic: No focal neurological deficits.    Lab Results:  Data Reviewed: I have personally reviewed following labs and imaging studies  CBC: Recent Labs  Lab 04/22/20 1736 04/22/20 1736 04/22/20 1746 04/22/20 1912 04/23/20 0201 04/23/20 1224 04/24/20 0225  WBC 11.9*  --   --  10.1 8.8 9.6 7.9  NEUTROABS 9.6*  --   --   --   --   --   --   HGB 11.1*   < > 11.2* 11.3* 15.2* 16.8* 15.6*  HCT 33.7*   < > 33.0* 32.8* 43.2 47.1* 45.3  MCV 91.3  --   --  89.1 87.4 86.4 88.6  PLT PLATELET CLUMPS NOTED ON SMEAR, UNABLE TO ESTIMATE  --   --  80* 68* 73* 74*   < > = values in this interval not displayed.    Basic Metabolic Panel: Recent Labs  Lab  04/22/20 1736 04/22/20 1746 04/23/20 0201 04/23/20 0736 04/24/20 0225  NA 135 135 139 140 137  K 3.2* 3.2* 3.2* 3.5 3.6  CL 102 101 106 108 106  CO2 22  --  20* 21* 21*  GLUCOSE 112* 103* 85 73 97  BUN 20 23 13 12 8   CREATININE 1.25* 1.20* 0.98 0.92 0.86  CALCIUM 8.8*  --  9.0 9.2 8.7*  MG  --   --  1.4*  --   --   PHOS  --   --  2.5  --   --     GFR: Estimated Creatinine Clearance: 34.4 mL/min (by C-G formula based on SCr of 0.86 mg/dL).  Liver Function Tests: Recent Labs  Lab  04/22/20 1736 04/23/20 0736 04/24/20 0225  AST 26 29 24   ALT 17 21 19   ALKPHOS 62 60 61  BILITOT 0.7 1.1 1.1  PROT 5.2* 4.9* 4.9*  ALBUMIN 3.1* 2.8* 2.7*    Recent Labs  Lab 04/22/20 1736  LIPASE 22    Coagulation Profile: Recent Labs  Lab 04/22/20 1755  INR 1.6*     CBG: Recent Labs  Lab 04/24/20 0110 04/24/20 0321 04/24/20 0629 04/24/20 0817 04/24/20 1028  GLUCAP 138* 51* 89 132* 106*     Recent Results (from the past 240 hour(s))  Resp Panel by RT-PCR (Flu A&B, Covid) Nasopharyngeal Swab     Status: None   Collection Time: 04/22/20  6:27 PM   Specimen: Nasopharyngeal Swab; Nasopharyngeal(NP) swabs in vial transport medium  Result Value Ref Range Status   SARS Coronavirus 2 by RT PCR NEGATIVE NEGATIVE Final    Comment: (NOTE) SARS-CoV-2 target nucleic acids are NOT DETECTED.  The SARS-CoV-2 RNA is generally detectable in upper respiratory specimens during the acute phase of infection. The lowest concentration of SARS-CoV-2 viral copies this assay can detect is 138 copies/mL. A negative result does not preclude SARS-Cov-2 infection and should not be used as the sole basis for treatment or other patient management decisions. A negative result may occur with  improper specimen collection/handling, submission of specimen other than nasopharyngeal swab, presence of viral mutation(s) within the areas targeted by this assay, and inadequate number of viral copies(<138  copies/mL). A negative result must be combined with clinical observations, patient history, and epidemiological information. The expected result is Negative.  Fact Sheet for Patients:  EntrepreneurPulse.com.au  Fact Sheet for Healthcare Providers:  IncredibleEmployment.be  This test is no t yet approved or cleared by the [place] FDA and  has been authorized for detection and/or diagnosis of SARS-CoV-2 by FDA under an Emergency Use Authorization (EUA). This EUA will remain  in effect (meaning this test can be used) for the duration of the COVID-19 declaration under Section 564(b)(1) of the Act, 21 U.S.C.section 360bbb-3(b)(1), unless the authorization is terminated  or revoked sooner.       Influenza A by PCR NEGATIVE NEGATIVE Final   Influenza B by PCR NEGATIVE NEGATIVE Final    Comment: (NOTE) The Xpert Xpress SARS-CoV-2/FLU/RSV plus assay is intended as an aid in the diagnosis of influenza from Nasopharyngeal swab specimens and should not be used as a sole basis for treatment. Nasal washings and aspirates are unacceptable for Xpert Xpress SARS-CoV-2/FLU/RSV testing.  Fact Sheet for Patients: EntrepreneurPulse.com.au  Fact Sheet for Healthcare Providers: IncredibleEmployment.be  This test is not yet approved or cleared by the [place] FDA and has been authorized for detection and/or diagnosis of SARS-CoV-2 by FDA under an Emergency Use Authorization (EUA). This EUA will remain in effect (meaning this test can be used) for the duration of the COVID-19 declaration under Section 564(b)(1) of the Act, 21 U.S.C. section 360bbb-3(b)(1), unless the authorization is terminated or revoked.  Performed at [facility], [place] [address] [place], [place] [address]   Blood culture (routine x 2)     Status: None (Preliminary result)   Collection Time: 04/22/20  8:27 PM   Specimen: BLOOD LEFT HAND    Result Value Ref Range Status   Specimen Description BLOOD LEFT HAND  Final   Special Requests AEROBIC BOTTLE ONLY Blood Culture adequate volume  Final   Culture   Final    NO GROWTH 2 DAYS Performed at [facility], [place] [address] [place], [place] [address]    Report Status PENDING  Incomplete  Blood culture (routine x 2)     Status: None (Preliminary result)   Collection Time: 04/22/20  8:37 PM   Specimen: BLOOD  Result Value Ref Range Status   Specimen Description BLOOD LEFT ANTECUBITAL  Final   Special Requests   Final    BOTTLES DRAWN AEROBIC AND ANAEROBIC Blood Culture adequate volume   Culture   Final    NO GROWTH 2 DAYS Performed at [facility], [place] [address], [place], [place] [address]    Report Status PENDING  Incomplete  MRSA PCR Screening     Status: None   Collection Time: 04/22/20 11:20 PM   Specimen: Nasopharyngeal  Result Value Ref Range Status   MRSA by PCR NEGATIVE NEGATIVE Final    Comment:        The GeneXpert MRSA Assay (FDA approved for NASAL specimens only), is one component of a comprehensive MRSA colonization surveillance program. It is not intended to diagnose MRSA infection nor to guide or monitor treatment for MRSA infections. Performed at [facility], [place] [address], [place], [place] [address]       Radiology Studies: DG Chest Port 1 View  Result Date: 04/23/2020 CLINICAL DATA:  Respiratory failure. EXAM: PORTABLE CHEST 1 VIEW COMPARISON:  04/22/2020. CT report 11/01/2016. FINDINGS: Prior CABG. Prior cardiac valve replacement. Heart size stable. Biapical pleural-parenchymal thickening again noted consistent scarring. Stable right base pleural thickening consistent with scarring. No acute infiltrate. Calcified nodular density again noted the right lung base consistent calcified granuloma. No pleural effusion or pneumothorax. Degenerative changes scoliosis thoracic spine. IMPRESSION: 1. Prior CABG. Prior cardiac valve  replacement. Heart size stable. 2. Biapical pleural-parenchymal thickening consistent scarring again noted. Stable right base pleural thickening consistent scarring. No acute pulmonary disease. Electronically Signed   By: Marcello Moores  Register   On: 04/23/2020 06:13   DG Chest Portable 1 View  Result Date: 04/22/2020 CLINICAL DATA:  86 year old female with hypertension. EXAM: PORTABLE CHEST 1 VIEW COMPARISON:  Chest radiograph dated 01/03/2017. FINDINGS: Minimal left lung base atelectasis. A small left pleural effusion may be present. The right lung is clear. Probable small calcified right lower lobe granuloma. No focal consolidation or pneumothorax. The cardiac silhouette is within limits. Median sternotomy wires and CABG vascular clips. Aortic valve repair. Atherosclerotic calcification of the aorta. No acute osseous pathology. IMPRESSION: Minimal left lung base atelectasis. A small left pleural effusion may be present. Electronically Signed   By: Anner Crete M.D.   On: 04/22/2020 18:13   CT Angio Abd/Pel W and/or Wo Contrast  Addendum Date: 04/22/2020   ADDENDUM REPORT: 04/22/2020 22:05 ADDENDUM: Please note the high density content seen in the proximal transverse colon on arterial phase does not appear to pool on the delayed venous phase and may represent an artifact. A GI bleed nuclear scan may provide better evaluation. There is long segment thickening of the distal descending, and sigmoid colon which may be related to muscular hypertrophy secondary to chronic inflammation. There is however some perisigmoid stranding and probable mild edema of the distal colonic wall which may represent mild colitis or diverticulitis. Clinical correlation is recommended. No abscess or perforation. Electronically Signed   By: Anner Crete M.D.   On: 04/22/2020 22:05   Result Date: 04/22/2020 CLINICAL DATA:  86 year old female with hypotension. Rectal bleed. EXAM: CTA ABDOMEN AND PELVIS WITHOUT AND WITH CONTRAST  TECHNIQUE: Multidetector CT  imaging of the abdomen and pelvis was performed using the standard protocol during bolus administration of intravenous contrast. Multiplanar reconstructed images and MIPs were obtained and reviewed to evaluate the vascular anatomy. CONTRAST:  42mL OMNIPAQUE IOHEXOL 350 MG/ML SOLN COMPARISON:  CT dated 11/01/2016. FINDINGS: VASCULAR Aorta: Advanced atherosclerotic calcification of the abdominal aorta. The aorta is ectatic measuring up to 2.3 cm in diameter. No aneurysmal dilatation or dissection. No periaortic fluid collection or edema. Celiac: The celiac axis and its major branches are patent. SMA: The SMA is patent. Renals: Atherosclerotic calcification of the origins of the renal arteries. The renal arteries are patent. IMA: The IMA is patent. Inflow: Advanced atherosclerotic calcification of the iliac arteries. No aneurysmal dilatation or dissection. Proximal Outflow: Atherosclerotic calcification. No acute findings. Veins: The IVC is unremarkable. The SMV, splenic vein, and main portal vein are patent. No portal venous gas. Review of the MIP images confirms the above findings. NON-VASCULAR Lower chest: The visualized lung bases are clear. There is eventration of the left hemidiaphragm. No intra-abdominal free air or free fluid. Hepatobiliary: The liver is unremarkable. No intrahepatic biliary dilatation. The gallbladder is distended. There are multiple stones within the gallbladder. No pericholecystic fluid or evidence of acute cholecystitis. Multiple small stones noted in the common bile duct with combined length of approximately 1 cm (62/15). Pancreas: Unremarkable. No pancreatic ductal dilatation or surrounding inflammatory changes. Spleen: Normal in size without focal abnormality. Adrenals/Urinary Tract: The adrenal glands unremarkable. There is a 3 mm nonobstructing right renal inferior pole calculus. No hydronephrosis. There is no hydronephrosis or nephrolithiasis on the left.  There is a 5 cm right renal upper pole cyst. The visualized ureters and urinary bladder appear unremarkable. Stomach/Bowel: There is sigmoid diverticulosis with segmental muscular hypertrophy. There is perisigmoid stranding consistent with acute diverticulitis. No diverticular abscess or perforation. There is contrast within the lumen of the proximal transverse colon (73/12, 73/19, and 42/15) not present on precontrast images concerning for active GI bleed. Evaluation of the bowel however is limited due to respiratory motion artifact. There is no bowel obstruction. The appendix is not visualized with certainty. No inflammatory changes identified in the right lower quadrant. Lymphatic: No adenopathy. Reproductive: Hysterectomy. Other: None Musculoskeletal: Osteopenia with scoliosis and degenerative changes of the spine. No acute osseous pathology. IMPRESSION: 1. Findings concerning for active GI bleed involving the proximal transverse colon. Clinical correlation is recommended. A GI bleed nuclear scan may provide additional information. 2. Sigmoid diverticulitis. No diverticular abscess or perforation. 3. Cholelithiasis and choledocholithiasis. No evidence of acute cholecystitis. 4. A 3 mm nonobstructing right renal inferior pole calculus. No hydronephrosis. Electronically Signed: By: Anner Crete M.D. On: 04/22/2020 21:25       LOS: 2 days   [facility] Pager on [email or web address]  04/24/2020, 12:06 PM

## 2020-04-24 NOTE — Progress Notes (Addendum)
Subjective: No further bleeding.  Feeling well.  Objective: Vital signs in last 24 hours: Temp:  [98.1 F (36.7 C)-98.7 F (37.1 C)] 98.7 F (37.1 C) (12/03 0300) Pulse Rate:  [67-91] 81 (12/03 0600) Resp:  [16-30] 18 (12/03 0600) BP: (93-156)/(51-94) 154/73 (12/03 0600) SpO2:  [91 %-97 %] 96 % (12/03 0600) Weight:  [49.1 kg] 49.1 kg (12/03 0334) Last BM Date: 04/22/20  Intake/Output from previous day: 12/02 0701 - 12/03 0700 In: 261.9 [P.O.:120; I.V.:70.6; IV Piggyback:71.3] Out: 600 [Urine:600] Intake/Output this shift: Total I/O In: 190.6 [P.O.:120; I.V.:70.6] Out: 400 [Urine:400]  General appearance: alert and no distress GI: soft, non-tender; bowel sounds normal; no masses,  no organomegaly  Lab Results: Recent Labs    04/23/20 0201 04/23/20 1224 04/24/20 0225  WBC 8.8 9.6 7.9  HGB 15.2* 16.8* 15.6*  HCT 43.2 47.1* 45.3  PLT 68* 73* 74*   BMET Recent Labs    04/23/20 0201 04/23/20 0736 04/24/20 0225  NA 139 140 137  K 3.2* 3.5 3.6  CL 106 108 106  CO2 20* 21* 21*  GLUCOSE 85 73 97  BUN 13 12 8   CREATININE 0.98 0.92 0.86  CALCIUM 9.0 9.2 8.7*   LFT Recent Labs    04/24/20 0225  PROT 4.9*  ALBUMIN 2.7*  AST 24  ALT 19  ALKPHOS 61  BILITOT 1.1   PT/INR Recent Labs    04/22/20 1755  LABPROT 18.2*  INR 1.6*   Hepatitis Panel No results for input(s): HEPBSAG, HCVAB, HEPAIGM, HEPBIGM in the last 72 hours. C-Diff No results for input(s): CDIFFTOX in the last 72 hours. Fecal Lactopherrin No results for input(s): FECLLACTOFRN in the last 72 hours.  Studies/Results: DG Chest Port 1 View  Result Date: 04/23/2020 CLINICAL DATA:  Respiratory failure. EXAM: PORTABLE CHEST 1 VIEW COMPARISON:  04/22/2020. CT report 11/01/2016. FINDINGS: Prior CABG. Prior cardiac valve replacement. Heart size stable. Biapical pleural-parenchymal thickening again noted consistent scarring. Stable right base pleural thickening consistent with scarring. No acute  infiltrate. Calcified nodular density again noted the right lung base consistent calcified granuloma. No pleural effusion or pneumothorax. Degenerative changes scoliosis thoracic spine. IMPRESSION: 1. Prior CABG. Prior cardiac valve replacement. Heart size stable. 2. Biapical pleural-parenchymal thickening consistent scarring again noted. Stable right base pleural thickening consistent scarring. No acute pulmonary disease. Electronically Signed   By: Marcello Moores  Register   On: 04/23/2020 06:13   DG Chest Portable 1 View  Result Date: 04/22/2020 CLINICAL DATA:  86 year old female with hypertension. EXAM: PORTABLE CHEST 1 VIEW COMPARISON:  Chest radiograph dated 01/03/2017. FINDINGS: Minimal left lung base atelectasis. A small left pleural effusion may be present. The right lung is clear. Probable small calcified right lower lobe granuloma. No focal consolidation or pneumothorax. The cardiac silhouette is within limits. Median sternotomy wires and CABG vascular clips. Aortic valve repair. Atherosclerotic calcification of the aorta. No acute osseous pathology. IMPRESSION: Minimal left lung base atelectasis. A small left pleural effusion may be present. Electronically Signed   By: Anner Crete M.D.   On: 04/22/2020 18:13   CT Angio Abd/Pel W and/or Wo Contrast  Addendum Date: 04/22/2020   ADDENDUM REPORT: 04/22/2020 22:05 ADDENDUM: Please note the high density content seen in the proximal transverse colon on arterial phase does not appear to pool on the delayed venous phase and may represent an artifact. A GI bleed nuclear scan may provide better evaluation. There is long segment thickening of the distal descending, and sigmoid colon which may be related to  muscular hypertrophy secondary to chronic inflammation. There is however some perisigmoid stranding and probable mild edema of the distal colonic wall which may represent mild colitis or diverticulitis. Clinical correlation is recommended. No abscess or  perforation. Electronically Signed   By: Anner Crete M.D.   On: 04/22/2020 22:05   Result Date: 04/22/2020 CLINICAL DATA:  86 year old female with hypotension. Rectal bleed. EXAM: CTA ABDOMEN AND PELVIS WITHOUT AND WITH CONTRAST TECHNIQUE: Multidetector CT imaging of the abdomen and pelvis was performed using the standard protocol during bolus administration of intravenous contrast. Multiplanar reconstructed images and MIPs were obtained and reviewed to evaluate the vascular anatomy. CONTRAST:  34mL OMNIPAQUE IOHEXOL 350 MG/ML SOLN COMPARISON:  CT dated 11/01/2016. FINDINGS: VASCULAR Aorta: Advanced atherosclerotic calcification of the abdominal aorta. The aorta is ectatic measuring up to 2.3 cm in diameter. No aneurysmal dilatation or dissection. No periaortic fluid collection or edema. Celiac: The celiac axis and its major branches are patent. SMA: The SMA is patent. Renals: Atherosclerotic calcification of the origins of the renal arteries. The renal arteries are patent. IMA: The IMA is patent. Inflow: Advanced atherosclerotic calcification of the iliac arteries. No aneurysmal dilatation or dissection. Proximal Outflow: Atherosclerotic calcification. No acute findings. Veins: The IVC is unremarkable. The SMV, splenic vein, and main portal vein are patent. No portal venous gas. Review of the MIP images confirms the above findings. NON-VASCULAR Lower chest: The visualized lung bases are clear. There is eventration of the left hemidiaphragm. No intra-abdominal free air or free fluid. Hepatobiliary: The liver is unremarkable. No intrahepatic biliary dilatation. The gallbladder is distended. There are multiple stones within the gallbladder. No pericholecystic fluid or evidence of acute cholecystitis. Multiple small stones noted in the common bile duct with combined length of approximately 1 cm (62/15). Pancreas: Unremarkable. No pancreatic ductal dilatation or surrounding inflammatory changes. Spleen: Normal in  size without focal abnormality. Adrenals/Urinary Tract: The adrenal glands unremarkable. There is a 3 mm nonobstructing right renal inferior pole calculus. No hydronephrosis. There is no hydronephrosis or nephrolithiasis on the left. There is a 5 cm right renal upper pole cyst. The visualized ureters and urinary bladder appear unremarkable. Stomach/Bowel: There is sigmoid diverticulosis with segmental muscular hypertrophy. There is perisigmoid stranding consistent with acute diverticulitis. No diverticular abscess or perforation. There is contrast within the lumen of the proximal transverse colon (73/12, 73/19, and 42/15) not present on precontrast images concerning for active GI bleed. Evaluation of the bowel however is limited due to respiratory motion artifact. There is no bowel obstruction. The appendix is not visualized with certainty. No inflammatory changes identified in the right lower quadrant. Lymphatic: No adenopathy. Reproductive: Hysterectomy. Other: None Musculoskeletal: Osteopenia with scoliosis and degenerative changes of the spine. No acute osseous pathology. IMPRESSION: 1. Findings concerning for active GI bleed involving the proximal transverse colon. Clinical correlation is recommended. A GI bleed nuclear scan may provide additional information. 2. Sigmoid diverticulitis. No diverticular abscess or perforation. 3. Cholelithiasis and choledocholithiasis. No evidence of acute cholecystitis. 4. A 3 mm nonobstructing right renal inferior pole calculus. No hydronephrosis. Electronically Signed: By: Anner Crete M.D. On: 04/22/2020 21:25    Medications:  Scheduled: . budesonide  0.5 mg Inhalation BID  . Chlorhexidine Gluconate Cloth  6 each Topical Daily   Continuous: . sodium chloride    . dextrose 40 mL/hr at 04/24/20 0600    Assessment/Plan: 1) Probable diverticular bleed. 2) Hypotension - resolved. 3) Sigmoid diverticulitis.   The patient is clinically stable.  She does not  have any further bleeding and her HGB is stable/normal.  If she rebleeds a repeat CTA and/or bleeding scan needs to be pursued.  A CTA is the better choice if she is hypotensive and severely bleeding as IR may be able to intervene shortly afterwards.  As for her diverticulitis, she is asymptomatic.  She does not necessarily need treatment and it needs to be closely monitored.  With the severity of her bleeding, her age, and fragility, it is best to monitor her over the weekend, at least until Sunday.  Plan: 1) Follow HGB and transfuse as necessary. 2) CTA and/or bleeding scan if rebleeding recurs. 3) Advance to a regular diet. 4) Dr. Rush Landmark is covering this weekend.  He will be on standby to answer any questions/management.  LOS: 2 days   Dearis Danis D 04/24/2020, 6:52 AM

## 2020-04-25 DIAGNOSIS — K922 Gastrointestinal hemorrhage, unspecified: Secondary | ICD-10-CM | POA: Diagnosis not present

## 2020-04-25 DIAGNOSIS — E162 Hypoglycemia, unspecified: Secondary | ICD-10-CM | POA: Diagnosis not present

## 2020-04-25 DIAGNOSIS — I2510 Atherosclerotic heart disease of native coronary artery without angina pectoris: Secondary | ICD-10-CM | POA: Diagnosis not present

## 2020-04-25 LAB — BASIC METABOLIC PANEL
Anion gap: 10 (ref 5–15)
BUN: 7 mg/dL — ABNORMAL LOW (ref 8–23)
CO2: 21 mmol/L — ABNORMAL LOW (ref 22–32)
Calcium: 8.4 mg/dL — ABNORMAL LOW (ref 8.9–10.3)
Chloride: 106 mmol/L (ref 98–111)
Creatinine, Ser: 0.71 mg/dL (ref 0.44–1.00)
GFR, Estimated: >60 mL/min (ref >60)
Glucose, Bld: 89 mg/dL (ref 70–99)
Potassium: 3.3 mmol/L — ABNORMAL LOW (ref 3.5–5.1)
Sodium: 137 mmol/L (ref 135–145)

## 2020-04-25 LAB — CBC
HCT: 44.1 % (ref 36.0–46.0)
Hemoglobin: 14.9 g/dL (ref 12.0–15.0)
MCH: 30.3 pg (ref 26.0–34.0)
MCHC: 33.8 g/dL (ref 30.0–36.0)
MCV: 89.6 fL (ref 80.0–100.0)
Platelets: 77 10*3/uL — ABNORMAL LOW (ref 150–400)
RBC: 4.92 MIL/uL (ref 3.87–5.11)
RDW: 14.2 % (ref 11.5–15.5)
WBC: 6.9 10*3/uL (ref 4.0–10.5)
nRBC: 0 % (ref 0.0–0.2)

## 2020-04-25 LAB — MAGNESIUM: Magnesium: 1.8 mg/dL (ref 1.7–2.4)

## 2020-04-25 MED ORDER — METOPROLOL TARTRATE 12.5 MG HALF TABLET
12.5000 mg | ORAL_TABLET | Freq: Two times a day (BID) | ORAL | Status: DC
  Filled 2020-04-25: qty 1

## 2020-04-25 MED ORDER — POTASSIUM CHLORIDE CRYS ER 20 MEQ PO TBCR
40.0000 meq | EXTENDED_RELEASE_TABLET | Freq: Once | ORAL | Status: AC
  Administered 2020-04-25: 40 meq via ORAL
  Filled 2020-04-25: qty 2

## 2020-04-25 MED ORDER — METOPROLOL TARTRATE 12.5 MG HALF TABLET
12.5000 mg | ORAL_TABLET | Freq: Two times a day (BID) | ORAL | Status: DC
  Administered 2020-04-25 – 2020-04-26 (×3): 12.5 mg via ORAL
  Filled 2020-04-25 (×4): qty 1

## 2020-04-25 NOTE — Progress Notes (Addendum)
[facility] PROGRESS NOTE   [name] FFM:[id number] DOB: 1928/11/21 DOA: 04/22/2020  PCP: Denita Lung, MD  Brief History/Interval Summary: 86 year old woman with history of CAD/CABG, MR, Maze procedure, GERD with esophagitis, paroxysmal atrial fibrillation on Eliquis 2.5 mg twice daily.  Admitted with weakness and fatigue for 3 days, found to have shock on presentation.  She reported some hematochezia at home before presentation.  Concern was for significant GI bleed however patient did not have a significant drop in her hemoglobin.  It was felt that some of her home medications may have contributed to her low blood pressure.   Consultants: Gastroenterology  Procedures: None yet  Antibiotics: Anti-infectives (From admission, onward)   None      Subjective/Interval History: Patient noted to be distracted that she was yesterday.  Has not had any further rectal bleeding.  Denies any abdominal pain nausea or vomiting.  No shortness of breath.       Assessment/Plan:  Shock Initially thought to be due to acute GI bleeding although it is not certain if that was indeed the reason for her low blood pressure.  Could have been due to overmedication.  Patient noted to be on irbesartan, Isordil, metoprolol at home.  Unclear if she is taking her medications appropriately although she mentions that her daughter helps with her medications.  Discussed with daughter yesterday who mentions that she has not noticed any change in the number of pills and that her mother does take her medications as prescribed.  Daughter does help with the medications and putting them in the pillboxes. Blood pressure has been stable.  Noted to be in the hypertensive range yesterday.  Isordil was resumed.  Noted to be slightly tachycardic today.  We will resume her metoprolol as well.    GI bleed Noted to have hematochezia.  Could be diverticular.  She was given 3 units since her blood pressure was low in  the emergency department.  Possible transverse colon bleeding noted on CT angiogram.   Gastroenterology has been following.  Hemoglobin has been stable.  Anticoagulation remains on hold.  Gastroenterology recommended monitoring her till Sunday to make sure there is no recurrence of bleeding.  We will also need to determine as to when the apixaban can be resumed.    Hypoglycemia Likely due to n.p.o. status.  Required D10 infusion briefly.  CBGs have been stable for the last 24 hours.  Encourage oral intake.    Concern for sigmoid diverticulitis Noted to be asymptomatic.  No abdominal tenderness was appreciated.  Holding off on antibiotics for now.  Acute blood loss anemia/thrombocytopenia Hemoglobin is stable.  Reason for low platelet count is not entirely clear.  Platelet counts were normal back in June.  She has not received any heparin products during this hospital stay.  Counts are stable.  Continue to monitor.  Hypokalemia Replete.  Magnesium 1.8.  Acute kidney injury Renal function back to baseline.  Monitor urine output.  History of paroxysmal atrial fibrillation On apixaban which is currently on hold.  Noted to be slightly tachycardic this morning.  We will resume her metoprolol.  History of coronary artery disease status post PCI in 2003 status post CABG in 2006 Stable currently.  No chest pain.  History of aortic stenosis status post TAVR Stable.  Hyperlipidemia May resume statin.  History of bronchial asthma Stable.  Continue inhalers.  History of stroke Stable.  PT and OT evaluations.  Home health recommended as long as there is  24-hour supervision and assistance.  Pressure injury Pressure Injury 04/22/20 Sacrum Medial Deep Tissue Pressure Injury - Purple or maroon localized area of discolored intact skin or blood-filled blister due to damage of underlying soft tissue from pressure and/or shear. (Active)  04/22/20 2230  Location: Sacrum  Location Orientation: Medial   Staging: Deep Tissue Pressure Injury - Purple or maroon localized area of discolored intact skin or blood-filled blister due to damage of underlying soft tissue from pressure and/or shear.  Wound Description (Comments):   Present on Admission: Yes     DVT Prophylaxis: SCDs Code Status: Full code Family Communication: Daughter was updated yesterday. Disposition Plan: PT and OT evaluation.  Hopefully return home tomorrow if hemoglobin remains stable.  We will need to find out from gastroenterology as to when the apixaban can be resumed.  Status is: Inpatient  Remains inpatient appropriate because:IV treatments appropriate due to intensity of illness or inability to take PO and Inpatient level of care appropriate due to severity of illness   Dispo: The patient is from: Home              Anticipated d/c is to: Home              Anticipated d/c date is: 1 day              Patient currently is not medically stable to d/c.       Medications:  Scheduled: . budesonide  0.5 mg Inhalation BID  . Chlorhexidine Gluconate Cloth  6 each Topical Daily  . isosorbide dinitrate  30 mg Oral BID  . pantoprazole  40 mg Oral BID  . rosuvastatin  40 mg Oral QPM   Continuous: . sodium chloride     EML:[id number]   Objective:  Vital Signs  Vitals:   04/25/20 0334 04/25/20 0335 04/25/20 0722 04/25/20 0748  BP: 112/63  126/80   Pulse: 84  85   Resp: 18  20   Temp: 98.8 F (37.1 C)  98.6 F (37 C)   TempSrc: Oral  Oral   SpO2: 94%  96% 95%  Weight:  47 kg    Height:        Intake/Output Summary (Last 24 hours) at 04/25/2020 1113 Last data filed at 04/25/2020 7121 Gross per 24 hour  Intake 600 ml  Output 1250 ml  Net -650 ml   Filed Weights   04/23/20 0452 04/24/20 0334 04/25/20 0335  Weight: 46.8 kg 49.1 kg 47 kg    General appearance: Awake alert.  In no distress.  Mildly distracted Resp: Clear to auscultation bilaterally.  Normal effort Cardio: S1-S2 is normal regular.  No  S3-S4.  No rubs murmurs or bruit GI: Abdomen is soft.  Nontender nondistended.  Bowel sounds are present normal.  No masses organomegaly Extremities: No edema.   Neurologic:  No focal neurological deficits.    Lab Results:  Data Reviewed: I have personally reviewed following labs and imaging studies  CBC: Recent Labs  Lab 04/22/20 1736 04/22/20 1746 04/22/20 1912 04/23/20 0201 04/23/20 1224 04/24/20 0225 04/25/20 0147  WBC 11.9*   < > 10.1 8.8 9.6 7.9 6.9  NEUTROABS 9.6*  --   --   --   --   --   --   HGB 11.1*   < > 11.3* 15.2* 16.8* 15.6* 14.9  HCT 33.7*   < > 32.8* 43.2 47.1* 45.3 44.1  MCV 91.3   < > 89.1 87.4 86.4 88.6 89.6  PLT  PLATELET CLUMPS NOTED ON SMEAR, UNABLE TO ESTIMATE   < > 80* 68* 73* 74* 77*   < > = values in this interval not displayed.    Basic Metabolic Panel: Recent Labs  Lab 04/22/20 1736 04/22/20 1736 04/22/20 1746 04/23/20 0201 04/23/20 0736 04/24/20 0225 04/25/20 0147  NA 135   < > 135 139 140 137 137  K 3.2*   < > 3.2* 3.2* 3.5 3.6 3.3*  CL 102   < > 101 106 108 106 106  CO2 22  --   --  20* 21* 21* 21*  GLUCOSE 112*   < > 103* 85 73 97 89  BUN 20   < > 23 13 12 8  7*  CREATININE 1.25*   < > 1.20* 0.98 0.92 0.86 0.71  CALCIUM 8.8*  --   --  9.0 9.2 8.7* 8.4*  MG  --   --   --  1.4*  --   --  1.8  PHOS  --   --   --  2.5  --   --   --    < > = values in this interval not displayed.    GFR: Estimated Creatinine Clearance: 35.4 mL/min (by C-G formula based on SCr of 0.71 mg/dL).  Liver Function Tests: Recent Labs  Lab 04/22/20 1736 04/23/20 0736 04/24/20 0225  AST 26 29 24   ALT 17 21 19   ALKPHOS 62 60 61  BILITOT 0.7 1.1 1.1  PROT 5.2* 4.9* 4.9*  ALBUMIN 3.1* 2.8* 2.7*    Recent Labs  Lab 04/22/20 1736  LIPASE 22    Coagulation Profile: Recent Labs  Lab 04/22/20 1755  INR 1.6*     CBG: Recent Labs  Lab 04/24/20 0629 04/24/20 0817 04/24/20 1028 04/24/20 1217 04/24/20 1450  GLUCAP 89 132* 106* 107* 109*      Recent Results (from the past 240 hour(s))  Resp Panel by RT-PCR (Flu A&B, Covid) Nasopharyngeal Swab     Status: None   Collection Time: 04/22/20  6:27 PM   Specimen: Nasopharyngeal Swab; Nasopharyngeal(NP) swabs in vial transport medium  Result Value Ref Range Status   SARS Coronavirus 2 by RT PCR NEGATIVE NEGATIVE Final    Comment: (NOTE) SARS-CoV-2 target nucleic acids are NOT DETECTED.  The SARS-CoV-2 RNA is generally detectable in upper respiratory specimens during the acute phase of infection. The lowest concentration of SARS-CoV-2 viral copies this assay can detect is 138 copies/mL. A negative result does not preclude SARS-Cov-2 infection and should not be used as the sole basis for treatment or other patient management decisions. A negative result may occur with  improper specimen collection/handling, submission of specimen other than nasopharyngeal swab, presence of viral mutation(s) within the areas targeted by this assay, and inadequate number of viral copies(<138 copies/mL). A negative result must be combined with clinical observations, patient history, and epidemiological information. The expected result is Negative.  Fact Sheet for Patients:  EntrepreneurPulse.com.au  Fact Sheet for Healthcare Providers:  IncredibleEmployment.be  This test is no t yet approved or cleared by the [place] FDA and  has been authorized for detection and/or diagnosis of SARS-CoV-2 by FDA under an Emergency Use Authorization (EUA). This EUA will remain  in effect (meaning this test can be used) for the duration of the COVID-19 declaration under Section 564(b)(1) of the Act, 21 U.S.C.section 360bbb-3(b)(1), unless the authorization is terminated  or revoked sooner.       Influenza A by PCR NEGATIVE NEGATIVE Final  Influenza B by PCR NEGATIVE NEGATIVE Final    Comment: (NOTE) The Xpert Xpress SARS-CoV-2/FLU/RSV plus assay is intended as  an aid in the diagnosis of influenza from Nasopharyngeal swab specimens and should not be used as a sole basis for treatment. Nasal washings and aspirates are unacceptable for Xpert Xpress SARS-CoV-2/FLU/RSV testing.  Fact Sheet for Patients: EntrepreneurPulse.com.au  Fact Sheet for Healthcare Providers: IncredibleEmployment.be  This test is not yet approved or cleared by the [place] FDA and has been authorized for detection and/or diagnosis of SARS-CoV-2 by FDA under an Emergency Use Authorization (EUA). This EUA will remain in effect (meaning this test can be used) for the duration of the COVID-19 declaration under Section 564(b)(1) of the Act, 21 U.S.C. section 360bbb-3(b)(1), unless the authorization is terminated or revoked.  Performed at [facility], [place] [address], [place], [place] [address]   Blood culture (routine x 2)     Status: None (Preliminary result)   Collection Time: 04/22/20  8:27 PM   Specimen: BLOOD LEFT HAND  Result Value Ref Range Status   Specimen Description BLOOD LEFT HAND  Final   Special Requests AEROBIC BOTTLE ONLY Blood Culture adequate volume  Final   Culture   Final    NO GROWTH 3 DAYS Performed at [facility], [place] [address], [place], [place] [address]    Report Status PENDING  Incomplete  Blood culture (routine x 2)     Status: None (Preliminary result)   Collection Time: 04/22/20  8:37 PM   Specimen: BLOOD  Result Value Ref Range Status   Specimen Description BLOOD LEFT ANTECUBITAL  Final   Special Requests   Final    BOTTLES DRAWN AEROBIC AND ANAEROBIC Blood Culture adequate volume   Culture   Final    NO GROWTH 3 DAYS Performed at [facility], [place] [address] [place], [place] [address]    Report Status PENDING  Incomplete  MRSA PCR Screening     Status: None   Collection Time: 04/22/20 11:20 PM   Specimen: Nasopharyngeal  Result Value Ref Range Status   MRSA by PCR  NEGATIVE NEGATIVE Final    Comment:        The GeneXpert MRSA Assay (FDA approved for NASAL specimens only), is one component of a comprehensive MRSA colonization surveillance program. It is not intended to diagnose MRSA infection nor to guide or monitor treatment for MRSA infections. Performed at [facility], [place] [address], [place], [place] [address]       Radiology Studies: No results found.     LOS: 3 days   [name] [employer] on [email or web address]  04/25/2020, 11:13 AM

## 2020-04-25 NOTE — Evaluation (Signed)
Occupational Therapy Evaluation Patient Details Name: [name] MRN: [id number] DOB: 1929/04/13 Today's Date: 04/25/2020    History of Present Illness 86 year old woman with history of CAD/CABG, MR, Maze procedure, GERD with esophagitis, paroxysmal atrial fibrillation on Eliquis 2.5 mg twice daily.  Admitted with weakness and fatigue for 3 days, found to have shock on presentation   Clinical Impression   Patient admitted with the above diagnosis.  PTA patient was fairly sedentary at home.  Daughter assisted with all bathing and dressing, meal prep and home management.  Daughter assists with meds and community mobility.  She uses a wheelchair for outside of the home.  Patient states she was moving a little better at home, but the daughter is always with her.  She appears to be close to her baseline, HH could be recommended, if the patient agrees, if SNF becomes necessary, OT can reassess.      Follow Up Recommendations  No OT follow up;Supervision/Assistance - 24 hour    Equipment Recommendations  None recommended by OT    Recommendations for Other Services       Precautions / Restrictions Precautions Precautions: Fall      Mobility Bed Mobility   Bed Mobility: Supine to Sit     Supine to sit: Modified independent (Device/Increase time);HOB elevated          Transfers Overall transfer level: Needs assistance Equipment used: 1 person hand held assist Transfers: Sit to/from [facility] Sit to Stand: Min assist Stand pivot transfers: Min assist            Balance   Sitting-balance support: Single extremity supported Sitting balance-[name] Scale: Good     Standing balance support: Bilateral upper extremity supported Standing balance-[name] Scale: Poor                             ADL either performed or assessed with clinical judgement   ADL Overall ADL's : At baseline                                        General ADL Comments: Patient able to long sit in bed, reach her feet.  States she sits on [facility] at home and daughter assists her with self care.  She has 24 hour supervison at home.  PT is following for mobility, but she is fairly sedentary at home.     Vision Patient Visual Report: No change from baseline       Perception     Praxis      Pertinent Vitals/Pain Pain Assessment: No/denies pain     Hand Dominance Right   Extremity/Trunk Assessment Upper Extremity Assessment Upper Extremity Assessment: Overall WFL for tasks assessed   Lower Extremity Assessment Lower Extremity Assessment: Defer to PT evaluation   Cervical / Trunk Assessment Cervical / Trunk Assessment: Kyphotic   Communication Communication Communication: HOH   Cognition Arousal/Alertness: Awake/alert Behavior During Therapy: Anxious Overall Cognitive Status: No family/caregiver present to determine baseline cognitive functioning                                 General Comments: Patient hyperverbal and very anxious.   General Comments                  Home Living  Family/patient expects to be discharged to:: Private residence Living Arrangements: Children Available Help at Discharge: Family;Available 24 hours/day Type of Home: House Home Access: Stairs to enter   Entrance Stairs-Rails: Right;Left;Can reach both Home Layout: One level     Bathroom Shower/Tub: Teacher, early years/pre: Standard     Home Equipment: Environmental consultant - 2 wheels;Bedside commode;Transport chair   Additional Comments: pt doesn't go into the bathroom, she uses her BSC and does sponge baths. Daughter helps wtih bathing and provides S for stairs. Pt uses transport chair when out of the house      Prior Functioning/Environment Level of Independence: Independent with assistive device(s)                 OT Problem List: Impaired balance (sitting and/or standing)      OT Treatment/Interventions:       OT Goals(Current goals can be found in the care plan section) Acute Rehab OT Goals Patient Stated Goal: My daughter is coming in to help me.  She wants to return home OT Goal Formulation: With patient Time For Goal Achievement: 04/27/20 Potential to Achieve Goals: Fair  OT Frequency:     Barriers to D/C:  none noted          Co-evaluation              AM-PAC OT "6 Clicks" Daily Activity     Outcome Measure Help from another person eating meals?: None Help from another person taking care of personal grooming?: A Little Help from another person toileting, which includes using toliet, bedpan, or urinal?: A Lot Help from another person bathing (including washing, rinsing, drying)?: A Lot Help from another person to put on and taking off regular upper body clothing?: A Little Help from another person to put on and taking off regular lower body clothing?: A Lot 6 Click Score: 16   End of Session Nurse Communication: Mobility status  Activity Tolerance: Patient tolerated treatment well Patient left: in chair;with call bell/phone within reach;with chair alarm set;with nursing/sitter in room  OT Visit Diagnosis: Unsteadiness on feet (R26.81);Muscle weakness (generalized) (M62.81)                Time: 2122-4825 OT Time Calculation (min): 18 min Charges:  OT General Charges $OT Visit: 1 Visit OT Evaluation $OT Eval Moderate Complexity: 1 Mod  04/25/2020  [name], [name]  Acute Rehabilitation Services  Office:  [phone]   Metta Clines 04/25/2020, 10:14 AM

## 2020-04-26 DIAGNOSIS — R5381 Other malaise: Secondary | ICD-10-CM | POA: Diagnosis not present

## 2020-04-26 DIAGNOSIS — I959 Hypotension, unspecified: Secondary | ICD-10-CM

## 2020-04-26 DIAGNOSIS — K922 Gastrointestinal hemorrhage, unspecified: Secondary | ICD-10-CM | POA: Diagnosis not present

## 2020-04-26 DIAGNOSIS — I480 Paroxysmal atrial fibrillation: Secondary | ICD-10-CM

## 2020-04-26 DIAGNOSIS — Z8673 Personal history of transient ischemic attack (TIA), and cerebral infarction without residual deficits: Secondary | ICD-10-CM | POA: Diagnosis not present

## 2020-04-26 LAB — BASIC METABOLIC PANEL
Anion gap: 8 (ref 5–15)
BUN: 8 mg/dL (ref 8–23)
CO2: 25 mmol/L (ref 22–32)
Calcium: 8.9 mg/dL (ref 8.9–10.3)
Chloride: 107 mmol/L (ref 98–111)
Creatinine, Ser: 0.59 mg/dL (ref 0.44–1.00)
GFR, Estimated: >60 mL/min (ref >60)
Glucose, Bld: 122 mg/dL — ABNORMAL HIGH (ref 70–99)
Potassium: 4.5 mmol/L (ref 3.5–5.1)
Sodium: 140 mmol/L (ref 135–145)

## 2020-04-26 LAB — CBC
HCT: 47.9 % — ABNORMAL HIGH (ref 36.0–46.0)
Hemoglobin: 15.9 g/dL — ABNORMAL HIGH (ref 12.0–15.0)
MCH: 30.1 pg (ref 26.0–34.0)
MCHC: 33.2 g/dL (ref 30.0–36.0)
MCV: 90.7 fL (ref 80.0–100.0)
Platelets: 85 10*3/uL — ABNORMAL LOW (ref 150–400)
RBC: 5.28 MIL/uL — ABNORMAL HIGH (ref 3.87–5.11)
RDW: 14.1 % (ref 11.5–15.5)
WBC: 5.7 10*3/uL (ref 4.0–10.5)
nRBC: 0 % (ref 0.0–0.2)

## 2020-04-26 MED ORDER — POLYETHYLENE GLYCOL 3350 17 G PO PACK: 17.0000 g | PACK | Freq: Two times a day (BID) | ORAL | Status: DC | PRN

## 2020-04-26 MED ORDER — DOCUSATE SODIUM 100 MG PO CAPS
100.0000 mg | ORAL_CAPSULE | Freq: Every day | ORAL | Status: DC
  Administered 2020-04-26 – 2020-04-27 (×2): 100 mg via ORAL
  Filled 2020-04-26 (×2): qty 1

## 2020-04-26 NOTE — Progress Notes (Signed)
Physical Therapy Treatment Patient Details Name: [name] MRN: [id number] DOB: Jul 10, 1928 Today's Date: 04/26/2020    History of Present Illness 86 year old woman with history of CAD/CABG, MR, Maze procedure, GERD with esophagitis, paroxysmal atrial fibrillation on Eliquis 2.5 mg twice daily.  Admitted with weakness and fatigue for 3 days, found to have shock on presentation    PT Comments    Pt progressing with mobility this session able to walk to and from bathroom. Pt with extreme kyphosis standing at 75 degrees over RW with cues for posture and direction. Pt does not walk outside of the house at baseline with daughter assist 24hrs and reports near baseline at present with WC at home. HHPT remains appropriate and pt encouraged to mobilize acutely.     Follow Up Recommendations  Home health PT;Supervision/Assistance - 24 hour     Equipment Recommendations  None recommended by PT    Recommendations for Other Services       Precautions / Restrictions Precautions Precautions: Fall Restrictions Weight Bearing Restrictions: No    Mobility  Bed Mobility Overal bed mobility: Needs Assistance       Supine to sit: Min guard;HOB elevated     General bed mobility comments: pt min guard for safety  Transfers Overall transfer level: Needs assistance   Transfers: Sit to/from Stand Sit to Stand: Min guard         General transfer comment: pt min guard for safety, cues for hand placement from bed and BSC  Ambulation/Gait Ambulation/Gait assistance: Min guard Gait Distance (Feet): 15 Feet Assistive device: Rolling walker (2 wheeled)     Gait velocity interpretation: <1.8 ft/sec, indicate of risk for recurrent falls General Gait Details: flexed at the hips to almost 75 degrees, cues for posture and safety with cues to direct RW to bathroom. Guarding for stability. Pt walked 15' x 2   Stairs             Wheelchair Mobility    Modified Rankin (Stroke  Patients Only)       Balance Overall balance assessment: Needs assistance Sitting-balance support: Single extremity supported Sitting balance-[name] Scale: Good       Standing balance-[name] Scale: Poor Standing balance comment: RW needed to maintain standing balance                            Cognition Arousal/Alertness: Awake/alert Behavior During Therapy: WFL for tasks assessed/performed Overall Cognitive Status: Impaired/Different from baseline Area of Impairment: Safety/judgement                         Safety/Judgement: Decreased awareness of deficits     General Comments: decreased awareness of deficits and safety      Exercises      General Comments        Pertinent Vitals/Pain Pain Assessment: No/denies pain    Home Living                      Prior Function            PT Goals (current goals can now be found in the care plan section) Acute Rehab PT Goals Patient Stated Goal: go home and drink some apple juice PT Goal Formulation: With patient/family    Frequency    Min 3X/week      PT Plan Current plan remains appropriate    Co-evaluation  AM-PAC PT "6 Clicks" Mobility   Outcome Measure  Help needed turning from your back to your side while in a flat bed without using bedrails?: A Little Help needed moving from lying on your back to sitting on the side of a flat bed without using bedrails?: A Little Help needed moving to and from a bed to a chair (including a wheelchair)?: A Little Help needed standing up from a chair using your arms (e.g., wheelchair or bedside chair)?: A Little Help needed to walk in hospital room?: A Little Help needed climbing 3-5 steps with a railing? : A Lot 6 Click Score: 17    End of Session   Activity Tolerance: Patient tolerated treatment well Patient left: with call bell/phone within reach;with family/visitor present;in chair   PT Visit Diagnosis:  Unsteadiness on feet (R26.81);Other abnormalities of gait and mobility (R26.89);Muscle weakness (generalized) (M62.81)     Time: 3967-2897 PT Time Calculation (min) (ACUTE ONLY): 21 min  Charges:  $Gait Training: 8-22 mins                     Bayard Males, PT Acute Rehabilitation Services Pager: [phone] Office: [place] 04/26/2020, 2:07 PM

## 2020-04-26 NOTE — Progress Notes (Signed)
PROGRESS NOTE  [name]:[id number] DOB: 11/14/1928   PCP: Denita Lung, MD  Patient is from: Home.  Lives with daughter.  Uses walker at baseline.  DOA: 04/22/2020 LOS: 4  Chief complaints: Weakness and fatigue  Brief Narrative / Interim history: 86 year old woman with history of CAD/CABG, MR, Maze procedure, GERD with esophagitis, paroxysmal A. fib on Eliquis 2.5 mg twice daily. Admitted with weakness and fatigue for 3 days, found to have shock on presentation. She reported some hematochezia at home before presentation.  She received 3 units of blood in ED due to hypotension although there was no significant drop in Hgb. It was felt that some of her home medications may have contributed to her low blood pressure/shock picture.  Most of her antihypertensive medications on hold.  GI consulted for hematochezia which was felt to be diverticular.  CT concerning for possible bleed from transverse colon but H&H remained stable.  GI recommended monitoring.    Subjective: Seen and examined earlier this morning.  No major events overnight of this morning.  No complaints.  Daughter at bedside.  She is concerned about patient's ability to manage stairs at home, and asked if therapy can come by and reassess patient before deciding disposition.  Objective: Vitals:   04/26/20 0624 04/26/20 0800 04/26/20 0843 04/26/20 1105  BP:   (!) 90/51 118/66  Pulse:   69 70  Resp:   20 15  Temp:   98.3 F (36.8 C)   TempSrc:   Oral   SpO2:  95% 96% 95%  Weight: 44.2 kg     Height:        Intake/Output Summary (Last 24 hours) at 04/26/2020 1150 Last data filed at 04/26/2020 0650 Gross per 24 hour  Intake 480 ml  Output 950 ml  Net -470 ml   Filed Weights   04/24/20 0334 04/25/20 0335 04/26/20 0624  Weight: 49.1 kg 47 kg 44.2 kg    Examination:  GENERAL: No apparent distress.  Nontoxic. HEENT: MMM.  Vision and hearing grossly intact.  NECK: Supple.  No apparent JVD.  RESP: On RA.   No IWOB.  Fair aeration bilaterally. CVS: Irregular rhythm.  Normal rate. Heart sounds normal.  ABD/GI/GU: BS+. Abd soft, NTND.  MSK/EXT:  Moves extremities. No apparent deformity. No edema.  SKIN: no apparent skin lesion or wound NEURO: Awake, alert and oriented appropriately.  No apparent focal neuro deficit. PSYCH: Calm. Normal affect.  Procedures:  None  Microbiology summarized: COVID-19 influenza PCR nonreactive.  Assessment & Plan: Hypotension: concern that this could be iatrogenic from multiple antihypertensive meds (irbesartan, Isordil and metoprolol).  Initially felt to be due to GI bleed but H&H remained stable. -Currently normotensive on metoprolol 12.5 mg twice daily.   -Continue holding irbesartan and Isordil  Hematochezia/lower GI bleed-thought to be diverticular.  CTA concerning for possible transverse colon bleeding but H&H stable.  Received 3 units in ED in the setting of low blood pressure although H&H is stable. -Discussed with Dr. Rush Landmark who recommended holding Eliquis for a total of 5 days and resuming with close follow-up  Paroxysmal A. fib: Rate controlled on low-dose metoprolol. -Continue metoprolol as above -Will resume Eliquis on 12/7  History of CAD s/p PCI in 2003 and CABG in 2006: Stable.  No chest pain. -Metoprolol as above  History of AS s/p TAVR: Stable  History of bronchial asthma: Stable -Continue breathing treatments as needed  History of CVA: Stable -We will resume Eliquis as above -Continue  home statin  AKI: Resolved.  Thrombocytopenia: Seems new but has been stable. -Continue monitoring  Hypoglycemia: While n.p.o.  Resolved.  Hypokalemia: Resolved  Concern for sigmoid diverticulitis-CT revealed thickening of distal descending and sigmoid colon with perisigmoid stranding and probable mild edema but no clinical signs of diverticulitis -Hold off antibiotics  Debility/physical deconditioning: Uses walker at  baseline. -PT/OT  GERD: -Continue Protonix.   Body mass index is 17.82 kg/m.      Pressure injury Pressure Injury 04/22/20 Sacrum Medial Deep Tissue Pressure Injury - Purple or maroon localized area of discolored intact skin or blood-filled blister due to damage of underlying soft tissue from pressure and/or shear. (Active)  04/22/20 2230  Location: Sacrum  Location Orientation: Medial  Staging: Deep Tissue Pressure Injury - Purple or maroon localized area of discolored intact skin or blood-filled blister due to damage of underlying soft tissue from pressure and/or shear.  Wound Description (Comments):   Present on Admission: Yes   DVT prophylaxis:  SCDs Start: 04/22/20 2055  Code Status: Full code Family Communication: Updated patient's daughter at bedside. Status is: Inpatient  Remains inpatient appropriate because:Unsafe d/c plan   Dispo: The patient is from: Home              Anticipated d/c is to: To be determined pending further evaluation by PT.  Notified TOC and RN.              Anticipated d/c date is: 1 day              Patient currently is medically stable to d/c.       Consultants:  Gastroenterology   Sch Meds:  Scheduled Meds: . budesonide  0.5 mg Inhalation BID  . Chlorhexidine Gluconate Cloth  6 each Topical Daily  . metoprolol tartrate  12.5 mg Oral BID  . pantoprazole  40 mg Oral BID  . rosuvastatin  40 mg Oral QPM   Continuous Infusions: . sodium chloride     PRN Meds:.albuterol  Antimicrobials: Anti-infectives (From admission, onward)   None       I have personally reviewed the following labs and images: CBC: Recent Labs  Lab 04/22/20 1736 04/22/20 1746 04/23/20 0201 04/23/20 1224 04/24/20 0225 04/25/20 0147 04/26/20 0206  WBC 11.9*   < > 8.8 9.6 7.9 6.9 5.7  NEUTROABS 9.6*  --   --   --   --   --   --   HGB 11.1*   < > 15.2* 16.8* 15.6* 14.9 15.9*  HCT 33.7*   < > 43.2 47.1* 45.3 44.1 47.9*  MCV 91.3   < > 87.4 86.4  88.6 89.6 90.7  PLT PLATELET CLUMPS NOTED ON SMEAR, UNABLE TO ESTIMATE   < > 68* 73* 74* 77* 85*   < > = values in this interval not displayed.   BMP &GFR Recent Labs  Lab 04/23/20 0201 04/23/20 0736 04/24/20 0225 04/25/20 0147 04/26/20 0206  NA 139 140 137 137 140  K 3.2* 3.5 3.6 3.3* 4.5  CL 106 108 106 106 107  CO2 20* 21* 21* 21* 25  GLUCOSE 85 73 97 89 122*  BUN 13 12 8  7* 8  CREATININE 0.98 0.92 0.86 0.71 0.59  CALCIUM 9.0 9.2 8.7* 8.4* 8.9  MG 1.4*  --   --  1.8  --   PHOS 2.5  --   --   --   --    Estimated Creatinine Clearance: 33.3 mL/min (by C-G formula based  on SCr of 0.59 mg/dL). Liver & Pancreas: Recent Labs  Lab 04/22/20 1736 04/23/20 0736 04/24/20 0225  AST 26 29 24   ALT 17 21 19   ALKPHOS 62 60 61  BILITOT 0.7 1.1 1.1  PROT 5.2* 4.9* 4.9*  ALBUMIN 3.1* 2.8* 2.7*   Recent Labs  Lab 04/22/20 1736  LIPASE 22   No results for input(s): AMMONIA in the last 168 hours. Diabetic: No results for input(s): HGBA1C in the last 72 hours. Recent Labs  Lab 04/24/20 0629 04/24/20 0817 04/24/20 1028 04/24/20 1217 04/24/20 1450  GLUCAP 89 132* 106* 107* 109*   Cardiac Enzymes: No results for input(s): CKTOTAL, CKMB, CKMBINDEX, TROPONINI in the last 168 hours. No results for input(s): PROBNP in the last 8760 hours. Coagulation Profile: Recent Labs  Lab 04/22/20 1755  INR 1.6*   Thyroid Function Tests: No results for input(s): TSH, T4TOTAL, FREET4, T3FREE, THYROIDAB in the last 72 hours. Lipid Profile: No results for input(s): CHOL, HDL, LDLCALC, TRIG, CHOLHDL, LDLDIRECT in the last 72 hours. Anemia Panel: No results for input(s): VITAMINB12, FOLATE, FERRITIN, TIBC, IRON, RETICCTPCT in the last 72 hours. Urine analysis:    Component Value Date/Time   COLORURINE STRAW (A) 12/26/2016 1425   APPEARANCEUR HAZY (A) 12/26/2016 1425   LABSPEC 1.010 01/08/2019 1526   PHURINE 5.0 12/26/2016 1425   GLUCOSEU NEGATIVE 12/26/2016 1425   HGBUR NEGATIVE  12/26/2016 1425   BILIRUBINUR negative 01/08/2019 1526   BILIRUBINUR n 12/05/2011 1627   KETONESUR negative 01/08/2019 [place] 12/26/2016 1425   PROTEINUR negative 01/08/2019 1526   PROTEINUR NEGATIVE 12/26/2016 1425   UROBILINOGEN 0.2 05/19/2014 1601   NITRITE Negative 01/08/2019 1526   NITRITE NEGATIVE 12/26/2016 1425   LEUKOCYTESUR Negative 01/08/2019 1526   Sepsis Labs: Invalid input(s): PROCALCITONIN, [place]  Microbiology: Recent Results (from the past 240 hour(s))  Resp Panel by RT-PCR (Flu A&B, Covid) Nasopharyngeal Swab     Status: None   Collection Time: 04/22/20  6:27 PM   Specimen: Nasopharyngeal Swab; Nasopharyngeal(NP) swabs in vial transport medium  Result Value Ref Range Status   SARS Coronavirus 2 by RT PCR NEGATIVE NEGATIVE Final    Comment: (NOTE) SARS-CoV-2 target nucleic acids are NOT DETECTED.  The SARS-CoV-2 RNA is generally detectable in upper respiratory specimens during the acute phase of infection. The lowest concentration of SARS-CoV-2 viral copies this assay can detect is 138 copies/mL. A negative result does not preclude SARS-Cov-2 infection and should not be used as the sole basis for treatment or other patient management decisions. A negative result may occur with  improper specimen collection/handling, submission of specimen other than nasopharyngeal swab, presence of viral mutation(s) within the areas targeted by this assay, and inadequate number of viral copies(<138 copies/mL). A negative result must be combined with clinical observations, patient history, and epidemiological information. The expected result is Negative.  Fact Sheet for Patients:  EntrepreneurPulse.com.au  Fact Sheet for Healthcare Providers:  IncredibleEmployment.be  This test is no t yet approved or cleared by the [place] FDA and  has been authorized for detection and/or diagnosis of SARS-CoV-2 by FDA  under an Emergency Use Authorization (EUA). This EUA will remain  in effect (meaning this test can be used) for the duration of the COVID-19 declaration under Section 564(b)(1) of the Act, 21 U.S.C.section 360bbb-3(b)(1), unless the authorization is terminated  or revoked sooner.       Influenza A by PCR NEGATIVE NEGATIVE Final   Influenza B by PCR NEGATIVE NEGATIVE  Final    Comment: (NOTE) The Xpert Xpress SARS-CoV-2/FLU/RSV plus assay is intended as an aid in the diagnosis of influenza from Nasopharyngeal swab specimens and should not be used as a sole basis for treatment. Nasal washings and aspirates are unacceptable for Xpert Xpress SARS-CoV-2/FLU/RSV testing.  Fact Sheet for Patients: EntrepreneurPulse.com.au  Fact Sheet for Healthcare Providers: IncredibleEmployment.be  This test is not yet approved or cleared by the [place] FDA and has been authorized for detection and/or diagnosis of SARS-CoV-2 by FDA under an Emergency Use Authorization (EUA). This EUA will remain in effect (meaning this test can be used) for the duration of the COVID-19 declaration under Section 564(b)(1) of the Act, 21 U.S.C. section 360bbb-3(b)(1), unless the authorization is terminated or revoked.  Performed at [facility], [place] [address], [place], [place] [phone]   Blood culture (routine x 2)     Status: None (Preliminary result)   Collection Time: 04/22/20  8:27 PM   Specimen: BLOOD LEFT HAND  Result Value Ref Range Status   Specimen Description BLOOD LEFT HAND  Final   Special Requests AEROBIC BOTTLE ONLY Blood Culture adequate volume  Final   Culture   Final    NO GROWTH 4 DAYS Performed at [facility], [place] [address] [place], [place] [address]    Report Status PENDING  Incomplete  Blood culture (routine x 2)     Status: None (Preliminary result)   Collection Time: 04/22/20  8:37 PM   Specimen: BLOOD  Result Value Ref Range  Status   Specimen Description BLOOD LEFT ANTECUBITAL  Final   Special Requests   Final    BOTTLES DRAWN AEROBIC AND ANAEROBIC Blood Culture adequate volume   Culture   Final    NO GROWTH 4 DAYS Performed at [facility], [place] [address], [place], [place] [address]    Report Status PENDING  Incomplete  MRSA PCR Screening     Status: None   Collection Time: 04/22/20 11:20 PM   Specimen: Nasopharyngeal  Result Value Ref Range Status   MRSA by PCR NEGATIVE NEGATIVE Final    Comment:        The GeneXpert MRSA Assay (FDA approved for NASAL specimens only), is one component of a comprehensive MRSA colonization surveillance program. It is not intended to diagnose MRSA infection nor to guide or monitor treatment for MRSA infections. Performed at [facility], [place] [address] [place],  [address]     Radiology Studies: No results found.    Shawntia Mangal T. [place]  If 7PM-7AM, please contact night-coverage [email or web address] 04/26/2020, 11:50 AM

## 2020-04-26 NOTE — Progress Notes (Addendum)
Brief GI progress note  Patient's case was signed out by Dr. Benson Norway to me over the weekend. Speaking with patient's medical team, she is not had any evidence of recurrent rectal bleeding. Hemoglobin is stable. Timing of reinitiation of anticoagulation is not clearly defined. Would wait at this point in time for a total of 5 days since her last bloody bowel movement before reinitiation of antiplatelet therapy or anticoagulation. If the patient is ready for discharge on Sunday then she may be discharged and Dr. Benson Norway will be updated about the plan of action and follow-up can be made thereafter by his group. If the patient remains hospitalized until 12/6 then Dr. Benson Norway will return to the service.  Please feel free if further questions arise to reach out to the inpatient [place] GI service over the weekend.  Justice Britain, MD [facility] Office # [phone]

## 2020-04-26 NOTE — TOC Initial Note (Signed)
Transition of Care (TOC) - Initial/Assessment Note  Lori Gibbons RN, BSN Transitions of Care Unit 4E- RN Case Manager See Treatment Team for direct phone #    Patient Details  Name: [name] MRN: [id number] Date of Birth: 1928-06-06  Transition of Care [facility]) CM/SW Contact:    Lori Patricia, RN Phone Number: 04/26/2020, 4:00 PM  Clinical Narrative:                 Noted recs for HHPT per PT note. This Probation officer in to speak with pt and [name] [name] at the bedside for transition of care needs. Per conversation pt lives at home with [name], plan to return home with [facility] services- list provided for [facility] choice Per CMS guidelines from medicare.gov website with star ratings (copy placed in shadow chart)- [name] states that pt had [place] [facility] services last year but does not remember agency- wants to check at home to see if she has name- this writer will f/u in am for [facility] choice- and made referral- [place] orders pending.  [name] also asked about rollator- she reports she has a script for one from patient's PCP- and went to [place] to rent one as she was not sure what size to get- the one she got was too big for pt- she was asking where else to go. Mentioned [employer] as another DME agency that will run Medicare. Checked with Shelia at [facility] and currently they do not have any rollators in stock.      Expected Discharge Plan: [place] Barriers to Discharge: Continued Medical Work up   Patient Goals and CMS Choice Patient states their goals for this hospitalization and ongoing recovery are:: return home CMS Medicare.gov Compare Post Acute Care list provided to:: Patient Choice offered to / list presented to : Patient, Adult Children  Expected Discharge Plan and Services Expected Discharge Plan: [place]   Discharge Planning Services: CM Consult Post Acute Care Choice: Durable Medical Equipment, Home Health Living arrangements for the past 2 months:  Single Family Home                                      Prior Living Arrangements/Services Living arrangements for the past 2 months: Single Family Home Lives with:: Self, Adult Children Patient language and need for interpreter reviewed:: Yes Do you feel safe going back to the place where you live?: Yes      Need for Family Participation in Patient Care: Yes (Comment) Care giver support system in place?: Yes (comment) Current home services: DME Criminal Activity/Legal Involvement Pertinent to Current Situation/Hospitalization: No - Comment as needed  Activities of Daily Living Home Assistive Devices/Equipment: Eyeglasses, Dentures (specify type), Walker (specify type) ADL Screening (condition at time of admission) Patient's cognitive ability adequate to safely complete daily activities?: No Is the patient deaf or have difficulty hearing?: Yes Does the patient have difficulty seeing, even when wearing glasses/contacts?: Yes Does the patient have difficulty concentrating, remembering, or making decisions?: Yes Patient able to express need for assistance with ADLs?: Yes Does the patient have difficulty dressing or bathing?: Yes Independently performs ADLs?: Yes (appropriate for developmental age) Does the patient have difficulty walking or climbing stairs?: Yes Weakness of Legs: Both Weakness of Arms/Hands: None  Permission Sought/Granted Permission sought to share information with : Facility Sport and exercise psychologist Permission granted to share information with : Yes,  Verbal Permission Granted     Permission granted to share info w AGENCY: HH        Emotional Assessment Appearance:: Appears stated age Attitude/Demeanor/Rapport: Engaged Affect (typically observed): Appropriate, Pleasant Orientation: : Oriented to Self, Oriented to Place, Oriented to  Time, Oriented to Situation Alcohol / Substance Use: Not Applicable Psych Involvement: No (comment)  Admission  diagnosis:  GI bleed [K92.2] Patient Active Problem List   Diagnosis Date Noted  . GI bleed 04/22/2020  . Hypotension   . AKI (acute kidney injury) ([place])   . Hypokalemia   . Hematochezia   . Shock circulatory ([place])   . Presbycusis of both ears 11/13/2019  . Vertigo 03/27/2017  . Arthritis 03/27/2017  . Stroke ([place])   . Status post transcatheter aortic valve replacement (TAVR) using bioprosthesis 01/03/2017  . Chronic apical periodontitis 12/28/2016  . Retained dental roots 12/28/2016  . Bilateral maxillary lateral exostoses 12/28/2016  . Diastolic dysfunction   . Chronic stable angina ([place])   . Tricuspid regurgitation   . Pulmonary regurgitation   . Left shoulder pain   . Back pain-similar to pt's angina 12/01/2015  . Carotid artery disease- s/p RCE 12/01/2015  . Bowen's disease 03/16/2015  . Dependent edema 10/30/2014  . History of allergy to aspirin 06/02/2014  . PAF (paroxysmal atrial fibrillation) ([place])   . Gastroesophageal reflux disease without esophagitis 12/05/2011  . Hx of CABG 2006 with Maze 07/21/2011  . Mitral regurgitation 07/21/2011  . Allergic rhinitis 10/05/2010  . Hyperlipidemia 10/05/2010  . Asthma 10/19/2007   PCP:  Lori Lung, MD Pharmacy:   [facility], [place] [address] Phone: [phone] Fax: [phone]  [facility], [place] [address] [place] [address] Phone: [phone] Fax: [place], [place] - [address] [place] [address] Phone: [phone] Fax: [phone]     Social Determinants of Health (SDOH) Interventions    Readmission Risk Interventions No flowsheet data found.

## 2020-04-27 DIAGNOSIS — K922 Gastrointestinal hemorrhage, unspecified: Secondary | ICD-10-CM | POA: Diagnosis not present

## 2020-04-27 DIAGNOSIS — Z7901 Long term (current) use of anticoagulants: Secondary | ICD-10-CM

## 2020-04-27 DIAGNOSIS — D696 Thrombocytopenia, unspecified: Secondary | ICD-10-CM

## 2020-04-27 DIAGNOSIS — I952 Hypotension due to drugs: Secondary | ICD-10-CM | POA: Diagnosis not present

## 2020-04-27 DIAGNOSIS — R5381 Other malaise: Secondary | ICD-10-CM | POA: Diagnosis not present

## 2020-04-27 DIAGNOSIS — R636 Underweight: Secondary | ICD-10-CM

## 2020-04-27 LAB — CULTURE, BLOOD (ROUTINE X 2)
Culture: NO GROWTH
Culture: NO GROWTH
Special Requests: ADEQUATE
Special Requests: ADEQUATE

## 2020-04-27 LAB — CBC
HCT: 47 % — ABNORMAL HIGH (ref 36.0–46.0)
Hemoglobin: 15.5 g/dL — ABNORMAL HIGH (ref 12.0–15.0)
MCH: 30.3 pg (ref 26.0–34.0)
MCHC: 33 g/dL (ref 30.0–36.0)
MCV: 91.8 fL (ref 80.0–100.0)
Platelets: 86 10*3/uL — ABNORMAL LOW (ref 150–400)
RBC: 5.12 MIL/uL — ABNORMAL HIGH (ref 3.87–5.11)
RDW: 13.9 % (ref 11.5–15.5)
WBC: 6.8 10*3/uL (ref 4.0–10.5)
nRBC: 0 % (ref 0.0–0.2)

## 2020-04-27 MED ORDER — APIXABAN 2.5 MG PO TABS
2.5000 mg | ORAL_TABLET | Freq: Two times a day (BID) | ORAL | 5 refills | Status: DC
Start: 2020-04-30 — End: 2020-06-24

## 2020-04-27 MED ORDER — DOCUSATE SODIUM 100 MG PO CAPS
100.0000 mg | ORAL_CAPSULE | Freq: Every day | ORAL | 0 refills | Status: DC
Start: 2020-04-27 — End: 2021-02-10

## 2020-04-27 MED ORDER — POLYETHYLENE GLYCOL 3350 17 GM/SCOOP PO POWD: 17.0000 g | Freq: Two times a day (BID) | ORAL | 2 refills | Status: DC | PRN

## 2020-04-27 NOTE — Discharge Summary (Signed)
Physician Discharge Summary  [name] KYH:[id number] DOB: 1928-12-12 DOA: 04/22/2020  PCP: Denita Lung, MD  Admit date: 04/22/2020 Discharge date: 04/27/2020  Admitted From: Home Disposition: Home  Recommendations for Outpatient Follow-up:  1. Follow ups as below. 2. Please obtain CBC/BMP/Mag at follow up 3. Please follow up on the following pending results: None  Home Health: PT Equipment/Devices: Patient has appropriate DME's at home.  Discharge Condition: Stable CODE STATUS: Full code   Follow-up Information    Denita Lung, MD. Schedule an appointment as soon as possible for a visit in 1 week(s).   Specialty: Family Medicine Contact information: [place] [address] [phone]        Burnell Blanks, MD. Schedule an appointment as soon as possible for a visit in 2 week(s).   Specialty: Cardiology Contact information: [place] [address] [place] [address] [phone]        [facility], [facility] Follow up.   Specialty: Home Health Services Why: HHPT/OT arranged- they will contact you to set up services Contact information: [address] [place]  [address] [phone]               Hospital Course: 86 year old woman with history of CAD/CABG, MR, Maze procedure, GERD with esophagitis, paroxysmal A. fib on Eliquis 2.5 mg twice daily. Admitted with weakness and fatigue for 3 days, found to have shock on presentation. She reported some hematochezia at home before presentation.  She received 3 units of blood in ED due to hypotension although there was no significant drop in Hgb.It was felt that some of her home medications may have contributed to her low blood pressure/shock picture.  Most of her antihypertensive medications on hold.  GI consulted for hematochezia which was felt to be diverticular.  CT concerning for possible bleed from transverse colon but H&H remained stable.  GI  recommended holding Eliquis for 5 days since her last bloody bowel movement, which appears to be 3 days ago now.  On the day of discharge, hemoglobin remained stable at 15.5.  Asymptomatic and felt ready to go home.  Advised to hold Eliquis for 2 more days.  See individual problem list below for more on hospital course.  Discharge Diagnoses:  Hypotension: concern that this could be iatrogenic from multiple antihypertensive meds (irbesartan, Isordil and metoprolol).  Initially felt to be due to GI bleed but H&H remained stable.  Hypotension resolved. -Continue home metoprolol and irbesartan -Discontinued Isordil until follow-up  Hematochezia/lower GI bleed-thought to be diverticular.  CTA concerning for possible transverse colon bleeding but H&H stable.  Hgb 11.1 on admit which is about baseline.  She received 3 units in ED in the setting of low blood pressure.  Hgb improved to 15.2 and remained stable since then. -On call GI, Dr. Rush Landmark who recommended holding Eliquis for a total of 5 days from her last bloody bowel movements and resuming with close follow-up -Patient to resume Eliquis on 12/8 if no further GI bleed -Also discussed the importance of avoiding constipation -Recheck CBC at follow-up.  Paroxysmal A. fib: Rate controlled on low-dose metoprolol. -Continue home metoprolol. -Patient to resume Eliquis on 12/8 if no further GI bleed.  History of CAD s/p PCI in 2003 and CABG in 2006: Stable.  No chest pain. -Metoprolol and irbesartan  History of AS s/p TAVR: Stable  History of bronchial asthma: Stable -Continue home medications  History of CVA: Stable -Continue home statin  AKI: Resolved.  Thrombocytopenia: Seems new but has been stable. -Recheck CBC at follow-up  Hypoglycemia: While n.p.o.  Resolved.  Hypokalemia: Resolved  Concern for sigmoid diverticulitis-CT revealed thickening of distal descending and sigmoid colon with perisigmoid stranding and  probable mild edema but no clinical signs of diverticulitis  Debility/physical deconditioning: Uses walker at baseline. -Home health PT  GERD: Stable -Continue Protonix.   Underweight: Body mass index is 17.3 kg/m.         Pressure Injury 04/22/20 Sacrum Medial Deep Tissue Pressure Injury - Purple or maroon localized area of discolored intact skin or blood-filled blister due to damage of underlying soft tissue from pressure and/or shear. (Active)  04/22/20 2230  Location: Sacrum  Location Orientation: Medial  Staging: Deep Tissue Pressure Injury - Purple or maroon localized area of discolored intact skin or blood-filled blister due to damage of underlying soft tissue from pressure and/or shear.  Wound Description (Comments):   Present on Admission: Yes    Discharge Exam: Vitals:   04/27/20 0757 04/27/20 0808  BP: (!) 92/50 118/60  Pulse: 65 68  Resp: 20 20  Temp: 97.8 F (36.6 C) (!) 97.5 F (36.4 C)  SpO2: 96% 96%    GENERAL: No apparent distress.  Nontoxic. HEENT: MMM.  Vision and hearing grossly intact.  NECK: Supple.  No apparent JVD.  RESP: On RA.  No IWOB.  Fair aeration bilaterally. CVS:  RRR. Heart sounds normal.  ABD/GI/GU: Bowel sounds present. Soft. Non tender.  MSK/EXT:  Moves extremities.  Significant muscle mass and subcu fat loss. SKIN: no apparent skin lesion or wound NEURO: Awake, alert and oriented appropriately.  No apparent focal neuro deficit. PSYCH: Calm. Normal affect.   Discharge Instructions  Discharge Instructions    Call MD for:  difficulty breathing, headache or visual disturbances   Complete by: As directed    Call MD for:  extreme fatigue   Complete by: As directed    Call MD for:  persistant dizziness or light-headedness   Complete by: As directed    Diet - low sodium heart healthy   Complete by: As directed    Discharge instructions   Complete by: As directed    It has been a pleasure taking care of you!  You were  hospitalized due to rectal bleeding and low blood pressure.  We believe your low blood pressure could be due to your blood pressure medications.  So we stopped some of your blood pressure medication at least for now.  In regards to rectal bleeding, your hemoglobin remained stable.  We recommend holding Eliquis for the next 2 days and resuming if no further bleeding.  Is also very important that you use bowel regimen such as Colace and MiraLAX to avoid constipation which could reduce your risk of bleeding.  Please review your new medication list and the directions on your medications before you take them.  Please follow-up with your primary care doctor and cardiologist in 1 to 2 weeks   Take care,   Increase activity slowly   Complete by: As directed    No wound care   Complete by: As directed      Allergies as of 04/27/2020      Reactions   Other Other (See Comments)   NO ACIDIC, TART< OR SPICY FOODS- DEVELOPS REFLUX OFTEN!! PATIENT HAS TROUBLE SWALLOWING TABLETS!!   Bactrim [sulfamethoxazole-trimethoprim] Other (See Comments)   "makes her feel funny" or "unsteady"   Amitriptyline Hcl Rash   Aspirin Hives, Swelling, Rash, Other (  See Comments)   Body became swollen   Tape Other (See Comments)   SKIN IS VERY THIN- WILL TEAR EASILY!!   Zetia [ezetimibe] Rash      Medication List    STOP taking these medications   doxycycline 100 MG tablet Commonly known as: VIBRA-TABS   isosorbide dinitrate 30 MG tablet Commonly known as: ISORDIL     TAKE these medications   acetaminophen 650 MG CR tablet Commonly known as: TYLENOL Take 1,300 mg by mouth 2 (two) times daily as needed for pain.   apixaban 2.5 MG Tabs tablet Commonly known as: Eliquis Take 1 tablet (2.5 mg total) by mouth 2 (two) times daily. Start taking on: April 30, 2020 What changed: These instructions start on April 30, 2020. If you are unsure what to do until then, ask your doctor or other care provider.    diazepam 2 MG tablet Commonly known as: Valium Take 1 tablet (2 mg total) by mouth every 6 (six) hours as needed for anxiety. What changed:   when to take this  reasons to take this   docusate sodium 100 MG capsule Commonly known as: COLACE Take 1 capsule (100 mg total) by mouth daily.   Ensure Plus Liqd Take 237 mLs by mouth 2 (two) times daily between meals.   Flovent HFA 220 MCG/ACT inhaler Generic drug: fluticasone INHALE 2 PUFFS INTO THE LUNGS TWICE DAILY What changed: when to take this   fluticasone 50 MCG/ACT nasal spray Commonly known as: FLONASE SHAKE LIQUID AND USE 2 SPRAYS IN EACH NOSTRIL EVERY DAY What changed: See the new instructions.   guaiFENesin 100 MG/5ML liquid Commonly known as: ROBITUSSIN Take 100 mg by mouth 2 (two) times daily as needed for cough or congestion. Reported on 12/08/2015   guaiFENesin 600 MG 12 hr tablet Commonly known as: MUCINEX Take 1 tablet (600 mg total) by mouth 2 (two) times daily.   irbesartan 75 MG tablet Commonly known as: AVAPRO TAKE 1 TABLET(75 MG) BY MOUTH DAILY What changed: See the new instructions.   loratadine 10 MG tablet Commonly known as: CLARITIN Take 10 mg by mouth daily.   metoprolol tartrate 25 MG tablet Commonly known as: LOPRESSOR TAKE 1/2 TABLET BY MOUTH TWICE DAILY   nitroGLYCERIN 0.4 MG SL tablet Commonly known as: NITROSTAT PLACE 1 TABLET UNDER TONGUE EVERY 5 MINUTES AS NEEDED FOR CHEST PAIN What changed:   how much to take  how to take this  when to take this  reasons to take this  additional instructions   pantoprazole 40 MG tablet Commonly known as: PROTONIX TAKE 1 TABLET BY MOUTH TWICE DAILY What changed: when to take this   polyethylene glycol powder 17 GM/SCOOP powder Commonly known as: MiraLax Take 17 g by mouth 2 (two) times daily as needed for moderate constipation.   rosuvastatin 40 MG tablet Commonly known as: CRESTOR TAKE 1 TABLET BY MOUTH EVERY DAY What changed:  when to take this   Ventolin HFA 108 (90 Base) MCG/ACT inhaler Generic drug: albuterol INHALE 2 PUFFS BY MOUTH EVERY 6 HOURS AS NEEDED FOR WHEEZING OR SHORTNESS OF BREATH What changed: See the new instructions.   Vitamin D-3 25 MCG (1000 UT) Caps Take 1,000 Units by mouth daily.       Consultations:  Gastroenterology  Procedures/Studies:   DG Chest Port 1 View  Result Date: 04/23/2020 CLINICAL DATA:  Respiratory failure. EXAM: PORTABLE CHEST 1 VIEW COMPARISON:  04/22/2020. CT report 11/01/2016. FINDINGS: Prior CABG. Prior cardiac valve  replacement. Heart size stable. Biapical pleural-parenchymal thickening again noted consistent scarring. Stable right base pleural thickening consistent with scarring. No acute infiltrate. Calcified nodular density again noted the right lung base consistent calcified granuloma. No pleural effusion or pneumothorax. Degenerative changes scoliosis thoracic spine. IMPRESSION: 1. Prior CABG. Prior cardiac valve replacement. Heart size stable. 2. Biapical pleural-parenchymal thickening consistent scarring again noted. Stable right base pleural thickening consistent scarring. No acute pulmonary disease. Electronically Signed   By: Marcello Moores  Register   On: 04/23/2020 06:13   DG Chest Portable 1 View  Result Date: 04/22/2020 CLINICAL DATA:  86 year old female with hypertension. EXAM: PORTABLE CHEST 1 VIEW COMPARISON:  Chest radiograph dated 01/03/2017. FINDINGS: Minimal left lung base atelectasis. A small left pleural effusion may be present. The right lung is clear. Probable small calcified right lower lobe granuloma. No focal consolidation or pneumothorax. The cardiac silhouette is within limits. Median sternotomy wires and CABG vascular clips. Aortic valve repair. Atherosclerotic calcification of the aorta. No acute osseous pathology. IMPRESSION: Minimal left lung base atelectasis. A small left pleural effusion may be present. Electronically Signed   By: Anner Crete M.D.   On: 04/22/2020 18:13   CT Angio Abd/Pel W and/or Wo Contrast  Addendum Date: 04/22/2020   ADDENDUM REPORT: 04/22/2020 22:05 ADDENDUM: Please note the high density content seen in the proximal transverse colon on arterial phase does not appear to pool on the delayed venous phase and may represent an artifact. A GI bleed nuclear scan may provide better evaluation. There is long segment thickening of the distal descending, and sigmoid colon which may be related to muscular hypertrophy secondary to chronic inflammation. There is however some perisigmoid stranding and probable mild edema of the distal colonic wall which may represent mild colitis or diverticulitis. Clinical correlation is recommended. No abscess or perforation. Electronically Signed   By: Anner Crete M.D.   On: 04/22/2020 22:05   Result Date: 04/22/2020 CLINICAL DATA:  86 year old female with hypotension. Rectal bleed. EXAM: CTA ABDOMEN AND PELVIS WITHOUT AND WITH CONTRAST TECHNIQUE: Multidetector CT imaging of the abdomen and pelvis was performed using the standard protocol during bolus administration of intravenous contrast. Multiplanar reconstructed images and MIPs were obtained and reviewed to evaluate the vascular anatomy. CONTRAST:  52mL OMNIPAQUE IOHEXOL 350 MG/ML SOLN COMPARISON:  CT dated 11/01/2016. FINDINGS: VASCULAR Aorta: Advanced atherosclerotic calcification of the abdominal aorta. The aorta is ectatic measuring up to 2.3 cm in diameter. No aneurysmal dilatation or dissection. No periaortic fluid collection or edema. Celiac: The celiac axis and its major branches are patent. SMA: The SMA is patent. Renals: Atherosclerotic calcification of the origins of the renal arteries. The renal arteries are patent. IMA: The IMA is patent. Inflow: Advanced atherosclerotic calcification of the iliac arteries. No aneurysmal dilatation or dissection. Proximal Outflow: Atherosclerotic calcification. No acute findings. Veins: The  IVC is unremarkable. The SMV, splenic vein, and main portal vein are patent. No portal venous gas. Review of the MIP images confirms the above findings. NON-VASCULAR Lower chest: The visualized lung bases are clear. There is eventration of the left hemidiaphragm. No intra-abdominal free air or free fluid. Hepatobiliary: The liver is unremarkable. No intrahepatic biliary dilatation. The gallbladder is distended. There are multiple stones within the gallbladder. No pericholecystic fluid or evidence of acute cholecystitis. Multiple small stones noted in the common bile duct with combined length of approximately 1 cm (62/15). Pancreas: Unremarkable. No pancreatic ductal dilatation or surrounding inflammatory changes. Spleen: Normal in size without focal abnormality. Adrenals/Urinary  Tract: The adrenal glands unremarkable. There is a 3 mm nonobstructing right renal inferior pole calculus. No hydronephrosis. There is no hydronephrosis or nephrolithiasis on the left. There is a 5 cm right renal upper pole cyst. The visualized ureters and urinary bladder appear unremarkable. Stomach/Bowel: There is sigmoid diverticulosis with segmental muscular hypertrophy. There is perisigmoid stranding consistent with acute diverticulitis. No diverticular abscess or perforation. There is contrast within the lumen of the proximal transverse colon (73/12, 73/19, and 42/15) not present on precontrast images concerning for active GI bleed. Evaluation of the bowel however is limited due to respiratory motion artifact. There is no bowel obstruction. The appendix is not visualized with certainty. No inflammatory changes identified in the right lower quadrant. Lymphatic: No adenopathy. Reproductive: Hysterectomy. Other: None Musculoskeletal: Osteopenia with scoliosis and degenerative changes of the spine. No acute osseous pathology. IMPRESSION: 1. Findings concerning for active GI bleed involving the proximal transverse colon. Clinical  correlation is recommended. A GI bleed nuclear scan may provide additional information. 2. Sigmoid diverticulitis. No diverticular abscess or perforation. 3. Cholelithiasis and choledocholithiasis. No evidence of acute cholecystitis. 4. A 3 mm nonobstructing right renal inferior pole calculus. No hydronephrosis. Electronically Signed: By: Anner Crete M.D. On: 04/22/2020 21:25        The results of significant diagnostics from this hospitalization (including imaging, microbiology, ancillary and laboratory) are listed below for reference.     Microbiology: Recent Results (from the past 240 hour(s))  Resp Panel by RT-PCR (Flu A&B, Covid) Nasopharyngeal Swab     Status: None   Collection Time: 04/22/20  6:27 PM   Specimen: Nasopharyngeal Swab; Nasopharyngeal(NP) swabs in vial transport medium  Result Value Ref Range Status   SARS Coronavirus 2 by RT PCR NEGATIVE NEGATIVE Final    Comment: (NOTE) SARS-CoV-2 target nucleic acids are NOT DETECTED.  The SARS-CoV-2 RNA is generally detectable in upper respiratory specimens during the acute phase of infection. The lowest concentration of SARS-CoV-2 viral copies this assay can detect is 138 copies/mL. A negative result does not preclude SARS-Cov-2 infection and should not be used as the sole basis for treatment or other patient management decisions. A negative result may occur with  improper specimen collection/handling, submission of specimen other than nasopharyngeal swab, presence of viral mutation(s) within the areas targeted by this assay, and inadequate number of viral copies(<138 copies/mL). A negative result must be combined with clinical observations, patient history, and epidemiological information. The expected result is Negative.  Fact Sheet for Patients:  EntrepreneurPulse.com.au  Fact Sheet for Healthcare Providers:  IncredibleEmployment.be  This test is no t yet approved or cleared by  the [place] FDA and  has been authorized for detection and/or diagnosis of SARS-CoV-2 by FDA under an Emergency Use Authorization (EUA). This EUA will remain  in effect (meaning this test can be used) for the duration of the COVID-19 declaration under Section 564(b)(1) of the Act, 21 U.S.C.section 360bbb-3(b)(1), unless the authorization is terminated  or revoked sooner.       Influenza A by PCR NEGATIVE NEGATIVE Final   Influenza B by PCR NEGATIVE NEGATIVE Final    Comment: (NOTE) The Xpert Xpress SARS-CoV-2/FLU/RSV plus assay is intended as an aid in the diagnosis of influenza from Nasopharyngeal swab specimens and should not be used as a sole basis for treatment. Nasal washings and aspirates are unacceptable for Xpert Xpress SARS-CoV-2/FLU/RSV testing.  Fact Sheet for Patients: EntrepreneurPulse.com.au  Fact Sheet for Healthcare Providers: IncredibleEmployment.be  This test is not yet approved  or cleared by the [place] and has been authorized for detection and/or diagnosis of SARS-CoV-2 by FDA under an Emergency Use Authorization (EUA). This EUA will remain in effect (meaning this test can be used) for the duration of the COVID-19 declaration under Section 564(b)(1) of the Act, 21 U.S.C. section 360bbb-3(b)(1), unless the authorization is terminated or revoked.  Performed at [facility], [place] [address] [place], [place] [address]   Blood culture (routine x 2)     Status: None   Collection Time: 04/22/20  8:27 PM   Specimen: BLOOD LEFT HAND  Result Value Ref Range Status   Specimen Description BLOOD LEFT HAND  Final   Special Requests AEROBIC BOTTLE ONLY Blood Culture adequate volume  Final   Culture   Final    NO GROWTH 5 DAYS Performed at [facility], [place] [address], [place], [place] [address]    Report Status 04/27/2020 FINAL  Final  Blood culture (routine x 2)     Status: None   Collection Time:  04/22/20  8:37 PM   Specimen: BLOOD  Result Value Ref Range Status   Specimen Description BLOOD LEFT ANTECUBITAL  Final   Special Requests   Final    BOTTLES DRAWN AEROBIC AND ANAEROBIC Blood Culture adequate volume   Culture   Final    NO GROWTH 5 DAYS Performed at [facility], [place] [address], [place], [place] [address]    Report Status 04/27/2020 FINAL  Final  MRSA PCR Screening     Status: None   Collection Time: 04/22/20 11:20 PM   Specimen: Nasopharyngeal  Result Value Ref Range Status   MRSA by PCR NEGATIVE NEGATIVE Final    Comment:        The GeneXpert MRSA Assay (FDA approved for NASAL specimens only), is one component of a comprehensive MRSA colonization surveillance program. It is not intended to diagnose MRSA infection nor to guide or monitor treatment for MRSA infections. Performed at [facility], [place] [address], [place], [place] [address]      Labs: BNP (last 3 results) No results for input(s): BNP in the last 8760 hours. Basic Metabolic Panel: Recent Labs  Lab 04/23/20 0201 04/23/20 0736 04/24/20 0225 04/25/20 0147 04/26/20 0206  NA 139 140 137 137 140  K 3.2* 3.5 3.6 3.3* 4.5  CL 106 108 106 106 107  CO2 20* 21* 21* 21* 25  GLUCOSE 85 73 97 89 122*  BUN 13 12 8  7* 8  CREATININE 0.98 0.92 0.86 0.71 0.59  CALCIUM 9.0 9.2 8.7* 8.4* 8.9  MG 1.4*  --   --  1.8  --   PHOS 2.5  --   --   --   --    Liver Function Tests: Recent Labs  Lab 04/22/20 1736 04/23/20 0736 04/24/20 0225  AST 26 29 24   ALT 17 21 19   ALKPHOS 62 60 61  BILITOT 0.7 1.1 1.1  PROT 5.2* 4.9* 4.9*  ALBUMIN 3.1* 2.8* 2.7*   Recent Labs  Lab 04/22/20 1736  LIPASE 22   No results for input(s): AMMONIA in the last 168 hours. CBC: Recent Labs  Lab 04/22/20 1736 04/22/20 1746 04/23/20 1224 04/24/20 0225 04/25/20 0147 04/26/20 0206 04/27/20 0315  WBC 11.9*   < > 9.6 7.9 6.9 5.7 6.8  NEUTROABS 9.6*  --   --   --   --   --   --   HGB 11.1*   < >  16.8*  15.6* 14.9 15.9* 15.5*  HCT 33.7*   < > 47.1* 45.3 44.1 47.9* 47.0*  MCV 91.3   < > 86.4 88.6 89.6 90.7 91.8  PLT PLATELET CLUMPS NOTED ON SMEAR, UNABLE TO ESTIMATE   < > 73* 74* 77* 85* 86*   < > = values in this interval not displayed.   Cardiac Enzymes: No results for input(s): CKTOTAL, CKMB, CKMBINDEX, TROPONINI in the last 168 hours. BNP: Invalid input(s): POCBNP CBG: Recent Labs  Lab 04/24/20 0629 04/24/20 0817 04/24/20 1028 04/24/20 1217 04/24/20 1450  GLUCAP 89 132* 106* 107* 109*   D-Dimer No results for input(s): DDIMER in the last 72 hours. Hgb A1c No results for input(s): HGBA1C in the last 72 hours. Lipid Profile No results for input(s): CHOL, HDL, LDLCALC, TRIG, CHOLHDL, LDLDIRECT in the last 72 hours. Thyroid function studies No results for input(s): TSH, T4TOTAL, T3FREE, THYROIDAB in the last 72 hours.  Invalid input(s): FREET3 Anemia work up No results for input(s): VITAMINB12, FOLATE, FERRITIN, TIBC, IRON, RETICCTPCT in the last 72 hours. Urinalysis    Component Value Date/Time   COLORURINE STRAW (A) 12/26/2016 1425   APPEARANCEUR HAZY (A) 12/26/2016 1425   LABSPEC 1.010 01/08/2019 1526   PHURINE 5.0 12/26/2016 1425   GLUCOSEU NEGATIVE 12/26/2016 1425   HGBUR NEGATIVE 12/26/2016 1425   BILIRUBINUR negative 01/08/2019 1526   BILIRUBINUR n 12/05/2011 1627   KETONESUR negative 01/08/2019 1526   KETONESUR NEGATIVE 12/26/2016 1425   PROTEINUR negative 01/08/2019 1526   PROTEINUR NEGATIVE 12/26/2016 1425   UROBILINOGEN 0.2 05/19/2014 1601   NITRITE Negative 01/08/2019 1526   NITRITE NEGATIVE 12/26/2016 1425   LEUKOCYTESUR Negative 01/08/2019 1526   Sepsis Labs Invalid input(s): PROCALCITONIN,  WBC,  LACTICIDVEN   Time coordinating discharge: 35 minutes  SIGNED:  Mercy Riding, MD  [facility] 04/27/2020, 3:05 PM  If 7PM-7AM, please contact night-coverage [email or web address]

## 2020-04-27 NOTE — TOC Transition Note (Signed)
Transition of Care (TOC) - CM/SW Discharge Note Marvetta Gibbons RN, BSN Transitions of Care Unit 4E- RN Case Manager See Treatment Team for direct phone #    Patient Details  Name: JYSSICA RIEF MRN: [id number] Date of Birth: 04/15/1929  Transition of Care [facility]) CM/SW Contact:  Dawayne Patricia, RN Phone Number: 04/27/2020, 1:05 PM   Clinical Narrative:    Pt stable for transition home today, f/u done with pt and daughter for [facility] choice- per daughter she states she is sure it was [facility] that they used and is requesting [facility] referral for them. Updated daughter that [employer] did not have any rollators in stock- she would need to f/u with them if she wanted to see about getting one with them, she has location for DME store.  Pt requesting [facility] transport home as she states she has 4 steps she needs assistance getting up into the home and daughter can not assist- explained that insurance would not cover this expense- pt agreeable to use [facility] transportation home using door-to-door service that will assist her up the steps into her home. Unit staff will assist in arranging [facility] door to door transport.   Call made to Amy with [facility] for [facility] PT/OT referral- referral has been accepted for home services.    Final next level of care: [place] Barriers to Discharge: Barriers Resolved   Patient Goals and CMS Choice Patient states their goals for this hospitalization and ongoing recovery are:: return home CMS Medicare.gov Compare Post Acute Care list provided to:: Patient Choice offered to / list presented to : Patient, Adult Children  Discharge Placement                 Home with [facility]      Discharge Plan and Services   Discharge Planning Services: CM Consult Post Acute Care Choice: Durable Medical Equipment, Home Health                    HH Arranged: PT, OT [facility] Agency: [facility] Date [place]: 04/27/20 Time [place]:  1115 Representative spoke with at [place]: Amy  Social Determinants of Health ([place]) Interventions     Readmission Risk Interventions No flowsheet data found.

## 2020-04-27 NOTE — Care Management Important Message (Signed)
Important Message  Patient Details  Name: [name] MRN: [id number] Date of Birth: Oct 09, 1928   Medicare Important Message Given:  Yes     Shelda Altes 04/27/2020, 10:07 AM

## 2020-04-29 ENCOUNTER — Telehealth: Payer: Self-pay

## 2020-04-29 DIAGNOSIS — I2510 Atherosclerotic heart disease of native coronary artery without angina pectoris: Secondary | ICD-10-CM | POA: Diagnosis not present

## 2020-04-29 DIAGNOSIS — E876 Hypokalemia: Secondary | ICD-10-CM | POA: Diagnosis not present

## 2020-04-29 DIAGNOSIS — Z951 Presence of aortocoronary bypass graft: Secondary | ICD-10-CM | POA: Diagnosis not present

## 2020-04-29 DIAGNOSIS — Z952 Presence of prosthetic heart valve: Secondary | ICD-10-CM | POA: Diagnosis not present

## 2020-04-29 DIAGNOSIS — E785 Hyperlipidemia, unspecified: Secondary | ICD-10-CM | POA: Diagnosis not present

## 2020-04-29 DIAGNOSIS — K922 Gastrointestinal hemorrhage, unspecified: Secondary | ICD-10-CM | POA: Diagnosis not present

## 2020-04-29 DIAGNOSIS — I480 Paroxysmal atrial fibrillation: Secondary | ICD-10-CM | POA: Diagnosis not present

## 2020-04-29 DIAGNOSIS — R5381 Other malaise: Secondary | ICD-10-CM | POA: Diagnosis not present

## 2020-04-29 NOTE — Telephone Encounter (Signed)
Called pt. LM per TOC report to get her scheduled for a hospital f/u.

## 2020-04-30 ENCOUNTER — Telehealth: Payer: Self-pay | Admitting: Family Medicine

## 2020-04-30 DIAGNOSIS — E785 Hyperlipidemia, unspecified: Secondary | ICD-10-CM | POA: Diagnosis not present

## 2020-04-30 DIAGNOSIS — K922 Gastrointestinal hemorrhage, unspecified: Secondary | ICD-10-CM | POA: Diagnosis not present

## 2020-04-30 DIAGNOSIS — E876 Hypokalemia: Secondary | ICD-10-CM | POA: Diagnosis not present

## 2020-04-30 DIAGNOSIS — R5381 Other malaise: Secondary | ICD-10-CM | POA: Diagnosis not present

## 2020-04-30 DIAGNOSIS — I2510 Atherosclerotic heart disease of native coronary artery without angina pectoris: Secondary | ICD-10-CM | POA: Diagnosis not present

## 2020-04-30 DIAGNOSIS — I480 Paroxysmal atrial fibrillation: Secondary | ICD-10-CM | POA: Diagnosis not present

## 2020-04-30 NOTE — Telephone Encounter (Signed)
Done kh

## 2020-04-30 NOTE — Telephone Encounter (Signed)
Will from [facility] called and is requesting verbal orders for OT for  2 times a week for 1 week 1 time a week for 1 week 2 times a week for 1 week He can be reached at [phone]

## 2020-04-30 NOTE — Telephone Encounter (Signed)
ok 

## 2020-05-06 DIAGNOSIS — E785 Hyperlipidemia, unspecified: Secondary | ICD-10-CM | POA: Diagnosis not present

## 2020-05-06 DIAGNOSIS — E876 Hypokalemia: Secondary | ICD-10-CM | POA: Diagnosis not present

## 2020-05-06 DIAGNOSIS — I480 Paroxysmal atrial fibrillation: Secondary | ICD-10-CM | POA: Diagnosis not present

## 2020-05-06 DIAGNOSIS — I2510 Atherosclerotic heart disease of native coronary artery without angina pectoris: Secondary | ICD-10-CM | POA: Diagnosis not present

## 2020-05-06 DIAGNOSIS — K922 Gastrointestinal hemorrhage, unspecified: Secondary | ICD-10-CM | POA: Diagnosis not present

## 2020-05-06 DIAGNOSIS — R5381 Other malaise: Secondary | ICD-10-CM | POA: Diagnosis not present

## 2020-05-07 DIAGNOSIS — K922 Gastrointestinal hemorrhage, unspecified: Secondary | ICD-10-CM | POA: Diagnosis not present

## 2020-05-07 DIAGNOSIS — E785 Hyperlipidemia, unspecified: Secondary | ICD-10-CM | POA: Diagnosis not present

## 2020-05-07 DIAGNOSIS — I480 Paroxysmal atrial fibrillation: Secondary | ICD-10-CM | POA: Diagnosis not present

## 2020-05-07 DIAGNOSIS — I2510 Atherosclerotic heart disease of native coronary artery without angina pectoris: Secondary | ICD-10-CM | POA: Diagnosis not present

## 2020-05-07 DIAGNOSIS — R5381 Other malaise: Secondary | ICD-10-CM | POA: Diagnosis not present

## 2020-05-07 DIAGNOSIS — E876 Hypokalemia: Secondary | ICD-10-CM | POA: Diagnosis not present

## 2020-05-10 ENCOUNTER — Other Ambulatory Visit: Payer: Self-pay | Admitting: Family Medicine

## 2020-05-10 MED ORDER — DOXYCYCLINE HYCLATE 100 MG PO TABS: 100.0000 mg | ORAL_TABLET | Freq: Two times a day (BID) | ORAL | 0 refills | Status: DC

## 2020-05-10 NOTE — Progress Notes (Signed)
And trouble with toe infection again I will therefore give her more doxycycline.

## 2020-05-11 DIAGNOSIS — I2510 Atherosclerotic heart disease of native coronary artery without angina pectoris: Secondary | ICD-10-CM | POA: Diagnosis not present

## 2020-05-11 DIAGNOSIS — K922 Gastrointestinal hemorrhage, unspecified: Secondary | ICD-10-CM | POA: Diagnosis not present

## 2020-05-11 DIAGNOSIS — E876 Hypokalemia: Secondary | ICD-10-CM | POA: Diagnosis not present

## 2020-05-11 DIAGNOSIS — R5381 Other malaise: Secondary | ICD-10-CM | POA: Diagnosis not present

## 2020-05-11 DIAGNOSIS — E785 Hyperlipidemia, unspecified: Secondary | ICD-10-CM | POA: Diagnosis not present

## 2020-05-11 DIAGNOSIS — I480 Paroxysmal atrial fibrillation: Secondary | ICD-10-CM | POA: Diagnosis not present

## 2020-05-19 DIAGNOSIS — K922 Gastrointestinal hemorrhage, unspecified: Secondary | ICD-10-CM | POA: Diagnosis not present

## 2020-05-19 DIAGNOSIS — E876 Hypokalemia: Secondary | ICD-10-CM | POA: Diagnosis not present

## 2020-05-19 DIAGNOSIS — I480 Paroxysmal atrial fibrillation: Secondary | ICD-10-CM | POA: Diagnosis not present

## 2020-05-19 DIAGNOSIS — E785 Hyperlipidemia, unspecified: Secondary | ICD-10-CM | POA: Diagnosis not present

## 2020-05-19 DIAGNOSIS — I2510 Atherosclerotic heart disease of native coronary artery without angina pectoris: Secondary | ICD-10-CM | POA: Diagnosis not present

## 2020-05-19 DIAGNOSIS — R5381 Other malaise: Secondary | ICD-10-CM | POA: Diagnosis not present

## 2020-05-21 ENCOUNTER — Other Ambulatory Visit: Payer: Self-pay

## 2020-05-21 ENCOUNTER — Telehealth (INDEPENDENT_AMBULATORY_CARE_PROVIDER_SITE_OTHER): Payer: Medicare Other | Admitting: Family Medicine

## 2020-05-21 ENCOUNTER — Encounter: Payer: Self-pay | Admitting: Family Medicine

## 2020-05-21 VITALS — Temp 97.7°F | Wt 95.0 lb

## 2020-05-21 DIAGNOSIS — L309 Dermatitis, unspecified: Secondary | ICD-10-CM | POA: Diagnosis not present

## 2020-05-21 MED ORDER — TRIAMCINOLONE ACETONIDE 0.1 % EX CREA: 1.0000 "application " | TOPICAL_CREAM | Freq: Two times a day (BID) | CUTANEOUS | 1 refills | Status: DC

## 2020-05-21 NOTE — Progress Notes (Addendum)
   Subjective:    Patient ID: [name], female    DOB: 07-26-1928, 86 y.o.   MRN: [id number]  HPI She complains of a rash on her back.  She blames it on the antibiotic.  She has been using cortisone cream for it with minimal response. Phone only.  I am in my office and patient is at home Review of Systems     Objective:   Physical Exam Alert and in no distress.  Picture was evaluated and shows a nonspecific slightly erythematous outbreak on her back.       Assessment & Plan:  Dermatitis - Plan: triamcinolone (KENALOG) 0.1 % She is to use the medicine sparingly twice per day for the next week or so.  She can also use Benadryl at night.  8 minutes spent discussing this with her.

## 2020-05-25 DIAGNOSIS — E785 Hyperlipidemia, unspecified: Secondary | ICD-10-CM | POA: Diagnosis not present

## 2020-05-25 DIAGNOSIS — I2510 Atherosclerotic heart disease of native coronary artery without angina pectoris: Secondary | ICD-10-CM | POA: Diagnosis not present

## 2020-05-25 DIAGNOSIS — E876 Hypokalemia: Secondary | ICD-10-CM | POA: Diagnosis not present

## 2020-05-25 DIAGNOSIS — R5381 Other malaise: Secondary | ICD-10-CM | POA: Diagnosis not present

## 2020-05-25 DIAGNOSIS — I480 Paroxysmal atrial fibrillation: Secondary | ICD-10-CM | POA: Diagnosis not present

## 2020-05-25 DIAGNOSIS — K922 Gastrointestinal hemorrhage, unspecified: Secondary | ICD-10-CM | POA: Diagnosis not present

## 2020-05-29 DIAGNOSIS — I480 Paroxysmal atrial fibrillation: Secondary | ICD-10-CM | POA: Diagnosis not present

## 2020-05-29 DIAGNOSIS — E876 Hypokalemia: Secondary | ICD-10-CM | POA: Diagnosis not present

## 2020-05-29 DIAGNOSIS — R5381 Other malaise: Secondary | ICD-10-CM | POA: Diagnosis not present

## 2020-05-29 DIAGNOSIS — Z951 Presence of aortocoronary bypass graft: Secondary | ICD-10-CM | POA: Diagnosis not present

## 2020-05-29 DIAGNOSIS — E785 Hyperlipidemia, unspecified: Secondary | ICD-10-CM | POA: Diagnosis not present

## 2020-05-29 DIAGNOSIS — K922 Gastrointestinal hemorrhage, unspecified: Secondary | ICD-10-CM | POA: Diagnosis not present

## 2020-05-29 DIAGNOSIS — Z952 Presence of prosthetic heart valve: Secondary | ICD-10-CM | POA: Diagnosis not present

## 2020-05-29 DIAGNOSIS — I2510 Atherosclerotic heart disease of native coronary artery without angina pectoris: Secondary | ICD-10-CM | POA: Diagnosis not present

## 2020-06-04 DIAGNOSIS — I2510 Atherosclerotic heart disease of native coronary artery without angina pectoris: Secondary | ICD-10-CM | POA: Diagnosis not present

## 2020-06-04 DIAGNOSIS — E876 Hypokalemia: Secondary | ICD-10-CM | POA: Diagnosis not present

## 2020-06-04 DIAGNOSIS — K922 Gastrointestinal hemorrhage, unspecified: Secondary | ICD-10-CM | POA: Diagnosis not present

## 2020-06-04 DIAGNOSIS — E785 Hyperlipidemia, unspecified: Secondary | ICD-10-CM | POA: Diagnosis not present

## 2020-06-04 DIAGNOSIS — I480 Paroxysmal atrial fibrillation: Secondary | ICD-10-CM | POA: Diagnosis not present

## 2020-06-04 DIAGNOSIS — R5381 Other malaise: Secondary | ICD-10-CM | POA: Diagnosis not present

## 2020-06-06 ENCOUNTER — Other Ambulatory Visit: Payer: Self-pay | Admitting: Family Medicine

## 2020-06-06 DIAGNOSIS — K219 Gastro-esophageal reflux disease without esophagitis: Secondary | ICD-10-CM

## 2020-06-10 DIAGNOSIS — K922 Gastrointestinal hemorrhage, unspecified: Secondary | ICD-10-CM | POA: Diagnosis not present

## 2020-06-10 DIAGNOSIS — R5381 Other malaise: Secondary | ICD-10-CM | POA: Diagnosis not present

## 2020-06-10 DIAGNOSIS — I480 Paroxysmal atrial fibrillation: Secondary | ICD-10-CM | POA: Diagnosis not present

## 2020-06-10 DIAGNOSIS — E785 Hyperlipidemia, unspecified: Secondary | ICD-10-CM | POA: Diagnosis not present

## 2020-06-10 DIAGNOSIS — E876 Hypokalemia: Secondary | ICD-10-CM | POA: Diagnosis not present

## 2020-06-10 DIAGNOSIS — I2510 Atherosclerotic heart disease of native coronary artery without angina pectoris: Secondary | ICD-10-CM | POA: Diagnosis not present

## 2020-06-11 DIAGNOSIS — I2510 Atherosclerotic heart disease of native coronary artery without angina pectoris: Secondary | ICD-10-CM | POA: Diagnosis not present

## 2020-06-11 DIAGNOSIS — R5381 Other malaise: Secondary | ICD-10-CM | POA: Diagnosis not present

## 2020-06-11 DIAGNOSIS — K922 Gastrointestinal hemorrhage, unspecified: Secondary | ICD-10-CM | POA: Diagnosis not present

## 2020-06-11 DIAGNOSIS — E876 Hypokalemia: Secondary | ICD-10-CM | POA: Diagnosis not present

## 2020-06-11 DIAGNOSIS — I480 Paroxysmal atrial fibrillation: Secondary | ICD-10-CM | POA: Diagnosis not present

## 2020-06-11 DIAGNOSIS — E785 Hyperlipidemia, unspecified: Secondary | ICD-10-CM | POA: Diagnosis not present

## 2020-06-17 DIAGNOSIS — E876 Hypokalemia: Secondary | ICD-10-CM | POA: Diagnosis not present

## 2020-06-17 DIAGNOSIS — I2510 Atherosclerotic heart disease of native coronary artery without angina pectoris: Secondary | ICD-10-CM | POA: Diagnosis not present

## 2020-06-17 DIAGNOSIS — K922 Gastrointestinal hemorrhage, unspecified: Secondary | ICD-10-CM | POA: Diagnosis not present

## 2020-06-17 DIAGNOSIS — R5381 Other malaise: Secondary | ICD-10-CM | POA: Diagnosis not present

## 2020-06-17 DIAGNOSIS — E785 Hyperlipidemia, unspecified: Secondary | ICD-10-CM | POA: Diagnosis not present

## 2020-06-17 DIAGNOSIS — I480 Paroxysmal atrial fibrillation: Secondary | ICD-10-CM | POA: Diagnosis not present

## 2020-06-24 ENCOUNTER — Telehealth: Payer: Self-pay | Admitting: Cardiovascular Disease

## 2020-06-24 ENCOUNTER — Other Ambulatory Visit: Payer: Self-pay | Admitting: Cardiovascular Disease

## 2020-06-24 ENCOUNTER — Other Ambulatory Visit: Payer: Self-pay | Admitting: Family Medicine

## 2020-06-24 DIAGNOSIS — E876 Hypokalemia: Secondary | ICD-10-CM | POA: Diagnosis not present

## 2020-06-24 DIAGNOSIS — I480 Paroxysmal atrial fibrillation: Secondary | ICD-10-CM | POA: Diagnosis not present

## 2020-06-24 DIAGNOSIS — R5381 Other malaise: Secondary | ICD-10-CM | POA: Diagnosis not present

## 2020-06-24 DIAGNOSIS — K922 Gastrointestinal hemorrhage, unspecified: Secondary | ICD-10-CM | POA: Diagnosis not present

## 2020-06-24 DIAGNOSIS — I2510 Atherosclerotic heart disease of native coronary artery without angina pectoris: Secondary | ICD-10-CM | POA: Diagnosis not present

## 2020-06-24 DIAGNOSIS — E785 Hyperlipidemia, unspecified: Secondary | ICD-10-CM | POA: Diagnosis not present

## 2020-06-24 NOTE — Telephone Encounter (Signed)
  Patient Consent for Virtual Visit       [name] has provided verbal consent on 06/24/2020 for a virtual visit (video or telephone).   CONSENT FOR VIRTUAL VISIT FOR:  [name]  By participating in this virtual visit I agree to the following:  I hereby voluntarily request, consent and authorize [facility] and its employed or contracted physicians, physician assistants, nurse practitioners or other licensed health care professionals (the Practitioner), to provide me with telemedicine health care services (the "Services") as deemed necessary by the treating Practitioner. I acknowledge and consent to receive the Services by the Practitioner via telemedicine. I understand that the telemedicine visit will involve communicating with the Practitioner through live audiovisual communication technology and the disclosure of certain medical information by electronic transmission. I acknowledge that I have been given the opportunity to request an in-person assessment or other available alternative prior to the telemedicine visit and am voluntarily participating in the telemedicine visit.  I understand that I have the right to withhold or withdraw my consent to the use of telemedicine in the course of my care at any time, without affecting my right to future care or treatment, and that the Practitioner or I may terminate the telemedicine visit at any time. I understand that I have the right to inspect all information obtained and/or recorded in the course of the telemedicine visit and may receive copies of available information for a reasonable fee.  I understand that some of the potential risks of receiving the Services via telemedicine include:  Marland Kitchen Delay or interruption in medical evaluation due to technological equipment failure or disruption; . Information transmitted may not be sufficient (e.g. poor resolution of images) to allow for appropriate medical decision making by the Practitioner; and/or   . In rare instances, security protocols could fail, causing a breach of personal health information.  Furthermore, I acknowledge that it is my responsibility to provide information about my medical history, conditions and care that is complete and accurate to the best of my ability. I acknowledge that Practitioner's advice, recommendations, and/or decision may be based on factors not within their control, such as incomplete or inaccurate data provided by me or distortions of diagnostic images or specimens that may result from electronic transmissions. I understand that the practice of medicine is not an exact science and that Practitioner makes no warranties or guarantees regarding treatment outcomes. I acknowledge that a copy of this consent can be made available to me via my patient portal ([facility]), or I can request a printed copy by calling the office of [facility].    I understand that my insurance will be billed for this visit.   I have read or had this consent read to me. . I understand the contents of this consent, which adequately explains the benefits and risks of the Services being provided via telemedicine.  . I have been provided ample opportunity to ask questions regarding this consent and the Services and have had my questions answered to my satisfaction. . I give my informed consent for the services to be provided through the use of telemedicine in my medical care

## 2020-06-24 NOTE — Telephone Encounter (Signed)
I spoke with the patient and her daughter. She called because her Eliquis initially was denied at the pharmacy due to needing an appointment.  They need for her visit to be virtual due to immobility.  I have scheduled her visit with APP on 2/18.  Eliquis has already been refilled.

## 2020-06-24 NOTE — Telephone Encounter (Signed)
Eliquis 2.5mg  refill request received. Patient is 87 years old, weight-43.1kg, Crea-0.59 on 04/26/2020, Diagnosis-Afib, and last seen by Richardson Dopp on 02/11/2020. Dose is appropriate based on dosing criteria. Will send in refill to requested pharmacy.

## 2020-06-24 NOTE — Telephone Encounter (Signed)
Left message for patient to call back  

## 2020-06-24 NOTE — Telephone Encounter (Signed)
Patient states she has been unable to walk since December, 2021. She is requesting a call back from Dr. Camillia Herter nurse to discuss.

## 2020-06-24 NOTE — Telephone Encounter (Signed)
Patient's daughter is returning call.

## 2020-07-09 NOTE — Progress Notes (Addendum)
Virtual Visit via Telephone Note   This visit type was conducted due to national recommendations for restrictions regarding the COVID-19 Pandemic (e.g. social distancing) in an effort to limit this patient's exposure and mitigate transmission in our community.  Due to her co-morbid illnesses, this patient is at least at moderate risk for complications without adequate follow up.  This format is felt to be most appropriate for this patient at this time.  The patient did not have access to video technology/had technical difficulties with video requiring transitioning to audio format only (telephone).  All issues noted in this document were discussed and addressed.  No physical exam could be performed with this format.  Please refer to the patient's chart for her  consent to telehealth for [facility].    Date:  07/10/2020   ID:  [name], DOB 1929-03-10, MRN [id number] The patient was identified using 2 identifiers.  Patient Location: Home Provider Location: Home Office   PCP:  Denita Lung, MD   [place]  Cardiologist:  Lauree Chandler, MD   Advanced Practice Provider:  No care team member to display Electrophysiologist:  None       Evaluation Performed:  Follow-Up Visit  Chief Complaint:   Chief Complaint  Patient presents with  . Hospitalization Follow-up    Admitted with GI bleed in 04/2020     Patient Profile: [name] is a 87 y.o. female with:  Coronary artery disease  ? S/p CABG, Maze procedure in 2006 ? Cath in 6/18: 3/4 grafts patent  Atrial fibrillation ? S/p Maze in 2006 ? Apixaban   Hx of CVA  Carotid artery disease s/p R CEA   Severe AS s/p TAVR in 12/2016   Mitral regurgitation ? Echo 8/19: Mild MR  Hypertension   Hyperlipidemia   2nd degree AVB ? Seen by EP (Dr. Curt Bears), [place] ? to Metoprolol   Intermittent LBBB   GERD  Fibromyalgia   Asthma   Prior CV studies: Echocardiogram 02/26/20 EF  55-60, no RWMA, mild asymmetric LVH, normal RVSF, RVSP 40.2, mild MR, s/p TAVR functioning normally without PVL   Carotid US 11/11/2019 Bilateral ICA 1-39  Echocardiogram 12/28/2017 EF 60-65, no RWMA, GRII DD, normally functioning TAVR without stenosis or regurgitation, mild MR, normal RVSF, mild TR, PASP 38  48-hour Holter monitor 9/18 Sinus bradycardia with lowest heart rate 48 bpm Premature ventricular contractions Premature atrial contractions Transient second degree AV block.  Cardiac catheterization 10/27/2016 LAD mid 100, distal 40; D1 50 LCx ostial 50; OM2 stent patent RCA occluded SVG-OM2 stent patent SVG-RPDA patent SVG-mid LAD patent LIMA-D1 atretic     History of Present Illness:   [name] was last seen in 9/21.  She was admitted in Dec 2021 with hematochezia and hypotension.  She was transfused with PRBCs x 3 units but H/H remained stable.  GI felt her LGI bleed was related to diverticular bleeding.  Her Apixaban was held for 5 days.  She is seen for f/u.  Since discharge from the hospital, she has done well.  She is somewhat limited in her activity.  She has been working with physical therapy.  She uses a walker.  She does have occasional episodes of angina.  She takes nitroglycerin with relief.  Of note, her isosorbide was stopped in the hospital due to hypotension.  She has not had syncope or near syncope.  She has not had significant shortness of breath.   Past Medical History:  Diagnosis Date  . Anxiety   . Aortic Stenosis s/p TAVR    Echo 10/21: EF 55-60, no RWMA, mild asymmetric LVH, normal RVSF, RVSP 40.2 mmHg, mild MR, s/p TAVR with mean gradient 8.4mmHg, no PVL  . Arthritis    OSTEO  . Aspirin allergy    on Plavix  . Asthma   . Bronchitis   . CAD (coronary artery disease)    a. s/p CABG 2006 with Cox-Maze procedure.  . Carotid artery disease ([place])    a. s/p R CEA.  . Chronic stable angina ([place])   . Diastolic dysfunction   . Eczema   .  Fibromyalgia   . GERD (gastroesophageal reflux disease)   . Heart murmur   . Hiatal hernia   . Hyperlipidemia   . Hypertension   . Kyphoscoliosis   . Mitral regurgitation   . Myocardial infarction [facility]) 2003   Stent to CFX  . PAF (paroxysmal atrial fibrillation) ([place])    a. pt has h/o hematuria on Eliquis and has since refused anticoagulation  . Pneumonia 2015 ?  Marland Kitchen Pulmonary regurgitation   . Skin cancer of arm   . Stroke ([place])   . Tricuspid regurgitation    Past Surgical History:  Procedure Laterality Date  . ABDOMINAL HYSTERECTOMY  1976  . CAROTID ENDARTERECTOMY  2010  . CORONARY ANGIOPLASTY WITH STENT PLACEMENT    . CORONARY ARTERY BYPASS GRAFT  01/2005   LIMA-D1; SVG-LAD; SVG-OM; SVG-PDA  . EYE SURGERY    . MULTIPLE EXTRACTIONS WITH ALVEOLOPLASTY  12/28/2016   Extraction of tooth #'s 2- 5,7-10, 12,13,17-20,and 22-29 with alveoloplasty and maxillary right and left lateral exostoses reductions  . MULTIPLE EXTRACTIONS WITH ALVEOLOPLASTY N/A 12/28/2016   Procedure: Extraction of tooth #'s 2- 5,7-10, 12,13,17-20,and 22-29 with alveoloplasty and maxillary right and left lateral exostoses reductions;  Surgeon: Lenn Cal, DDS;  Location: [place];  Service: Oral Surgery;  Laterality: N/A;  . RIGHT/LEFT HEART CATH AND CORONARY/GRAFT ANGIOGRAPHY N/A 10/27/2016   Procedure: Right/Left Heart Cath and Coronary/Graft Angiography;  Surgeon: Burnell Blanks, MD;  Location: [facility] CV LAB;  Service: Cardiovascular;  Laterality: N/A;  . TEE WITHOUT CARDIOVERSION N/A 01/03/2017   Procedure: TRANSESOPHAGEAL ECHOCARDIOGRAM (TEE);  Surgeon: Burnell Blanks, MD;  Location: [facility];  Service: Open Heart Surgery;  Laterality: N/A;  . TONSILLECTOMY    . TRANSCATHETER AORTIC VALVE REPLACEMENT, TRANSFEMORAL N/A 01/03/2017   Procedure: TRANSCATHETER AORTIC VALVE REPLACEMENT, TRANSFEMORAL;  Surgeon: Burnell Blanks, MD;  Location: [place];  Service: Open Heart Surgery;  Laterality:  N/A;  . TUMOR REMOVAL       Current Meds  Medication Sig  . acetaminophen (TYLENOL) 650 MG CR tablet Take 1,300 mg by mouth 2 (two) times daily as needed for pain.   . Cholecalciferol (VITAMIN D-3) 25 MCG (1000 UT) CAPS Take 1,000 Units by mouth daily.  . diazepam (VALIUM) 2 MG tablet Take 1 tablet (2 mg total) by mouth every 6 (six) hours as needed for anxiety.  . docusate sodium (COLACE) 100 MG capsule Take 1 capsule (100 mg total) by mouth daily.  Marland Kitchen ELIQUIS 2.5 MG TABS tablet TAKE 1 TABLET(2.5 MG) BY MOUTH TWICE DAILY  . Ensure Plus (ENSURE PLUS) LIQD Take 237 mLs by mouth 2 (two) times daily between meals.  Marland Kitchen FLOVENT HFA 220 MCG/ACT inhaler INHALE 2 PUFFS INTO THE LUNGS TWICE DAILY  . fluticasone (FLONASE) 50 MCG/ACT nasal spray SHAKE LIQUID AND USE 2 SPRAYS IN EACH NOSTRIL EVERY DAY  .  guaiFENesin (MUCINEX) 600 MG 12 hr tablet Take 1 tablet (600 mg total) by mouth 2 (two) times daily.  Marland Kitchen guaiFENesin (ROBITUSSIN) 100 MG/5ML liquid Take 100 mg by mouth 2 (two) times daily as needed for cough or congestion. Reported on 12/08/2015  . irbesartan (AVAPRO) 75 MG tablet TAKE 1 TABLET(75 MG) BY MOUTH DAILY  . loratadine (CLARITIN) 10 MG tablet Take 10 mg by mouth daily.    . metoprolol tartrate (LOPRESSOR) 25 MG tablet TAKE 1/2 TABLET BY MOUTH TWICE DAILY  . nitroGLYCERIN (NITROSTAT) 0.4 MG SL tablet PLACE 1 TABLET UNDER TONGUE EVERY 5 MINUTES AS NEEDED FOR CHEST PAIN  . pantoprazole (PROTONIX) 40 MG tablet TAKE 1 TABLET BY MOUTH TWICE DAILY  . polyethylene glycol powder (MIRALAX) 17 GM/SCOOP powder Take 17 g by mouth 2 (two) times daily as needed for moderate constipation.  . rosuvastatin (CRESTOR) 40 MG tablet TAKE 1 TABLET BY MOUTH EVERY DAY  . triamcinolone (KENALOG) 0.1 % Apply 1 application topically 2 (two) times daily.  . VENTOLIN HFA 108 (90 Base) MCG/ACT inhaler INHALE 2 PUFFS BY MOUTH EVERY 6 HOURS AS NEEDED FOR WHEEZING OR SHORTNESS OF BREATH     Allergies:   Other, Bactrim  [sulfamethoxazole-trimethoprim], Amitriptyline hcl, Aspirin, Tape, and Zetia [ezetimibe]   Social History   Tobacco Use  . Smoking status: Never Smoker  . Smokeless tobacco: Former Systems developer    Types: Snuff  Vaping Use  . Vaping Use: Never used  Substance Use Topics  . Alcohol use: No  . Drug use: No     Family Hx: The patient's family history includes Heart failure in her mother; Other in her father.  ROS:   Please see the history of present illness.  She has not had any further melena or hematochezia.   Labs/Other Tests and Data Reviewed:    EKG:  No ECG reviewed.  Recent Labs: 04/24/2020: ALT 19 04/25/2020: Magnesium 1.8 04/26/2020: BUN 8; Creatinine, Ser 0.59; Potassium 4.5; Sodium 140 04/27/2020: Hemoglobin 15.5; Platelets 86   Recent Lipid Panel Lab Results  Component Value Date/Time   CHOL 151 11/13/2019 03:29 PM   TRIG 84 11/13/2019 03:29 PM   HDL 77 11/13/2019 03:29 PM   CHOLHDL 2.0 11/13/2019 03:29 PM   CHOLHDL 2.0 08/21/2015 12:01 AM   LDLCALC 58 11/13/2019 03:29 PM    Wt Readings from Last 3 Encounters:  07/10/20 95 lb (43.1 kg)  05/21/20 95 lb (43.1 kg)  04/27/20 94 lb 9.2 oz (42.9 kg)     Risk Assessment/Calculations:    CHA2DS2-VASc Score = 7  This indicates a 11.2% annual risk of stroke. The patient's score is based upon: CHF History: No HTN History: Yes Diabetes History: No Stroke History: Yes Vascular Disease History: Yes Age Score: 2 Gender Score: 1     Objective:    Vital Signs:  BP 95/73   Pulse 68   Ht 5\' 2"  (1.575 m)   Wt 95 lb (43.1 kg)   BMI 17.38 kg/m    VITAL SIGNS:  reviewed GEN:  no acute distress PSYCH:  normal affect  ASSESSMENT & PLAN:    1. Coronary artery disease involving native coronary artery of native heart with angina pectoris ([place]) History of CABG in 2006.  She has occasional anginal symptoms.  Overall, it does not sound as though these are getting any worse.  She does take nitroglycerin with relief.  Her  isosorbide was stopped in the hospital due to hypotension.  Her blood pressure is running  somewhat low.  I think she would be better off taking long-acting nitrates and stopping her ARB.  She is not on aspirin as she is on Apixaban.  Resume isosorbide dinitrate 15 mg twice daily.  Continue rosuvastatin.  Follow-up in 6 months.  2. S/P TAVR (transcatheter aortic valve replacement) Stable prosthesis on echocardiogram 02/2020.  Continue SBE prophylaxis.  3. PAF (paroxysmal atrial fibrillation) ([place]) She has not had further bleeding since resuming her anticoagulation.  She has not had any follow-up labs.  We will try to arrange home visit to obtain a BMET, CBC.  Continue apixaban 2.5 mg twice daily (age >44, weight <60 kg).  4. Essential hypertension Blood pressure is running somewhat low.  Luckily, she is not symptomatic.  I have asked her to stop taking irbesartan.  Resume isosorbide as noted above.  We will try to arrange a home visit to check her blood pressure.  5. History of GI bleed No apparent recurrent bleeding.  Obtain follow-up CBC as noted.   Time:   Today, I have spent 14 minutes with the patient with telehealth technology discussing the above problems.     Medication Adjustments/Labs and Tests Ordered: Current medicines are reviewed at length with the patient today.  Concerns regarding medicines are outlined above.   Tests Ordered: No orders of the defined types were placed in this encounter.   Medication Changes: No orders of the defined types were placed in this encounter.   Follow Up:  In Person in 6 month(s)  Signed, Richardson Dopp, PA-C  07/10/2020 1:24 PM    [facility]

## 2020-07-10 ENCOUNTER — Encounter: Payer: Self-pay | Admitting: Physician Assistant

## 2020-07-10 ENCOUNTER — Other Ambulatory Visit: Payer: Self-pay

## 2020-07-10 ENCOUNTER — Telehealth (INDEPENDENT_AMBULATORY_CARE_PROVIDER_SITE_OTHER): Payer: Medicare Other | Admitting: Physician Assistant

## 2020-07-10 VITALS — BP 95/73 | HR 68 | Ht 62.0 in | Wt 95.0 lb

## 2020-07-10 DIAGNOSIS — I25119 Atherosclerotic heart disease of native coronary artery with unspecified angina pectoris: Secondary | ICD-10-CM | POA: Diagnosis not present

## 2020-07-10 DIAGNOSIS — Z8719 Personal history of other diseases of the digestive system: Secondary | ICD-10-CM | POA: Diagnosis not present

## 2020-07-10 DIAGNOSIS — E782 Mixed hyperlipidemia: Secondary | ICD-10-CM

## 2020-07-10 DIAGNOSIS — Z952 Presence of prosthetic heart valve: Secondary | ICD-10-CM

## 2020-07-10 DIAGNOSIS — I10 Essential (primary) hypertension: Secondary | ICD-10-CM

## 2020-07-10 DIAGNOSIS — I6523 Occlusion and stenosis of bilateral carotid arteries: Secondary | ICD-10-CM

## 2020-07-10 DIAGNOSIS — I480 Paroxysmal atrial fibrillation: Secondary | ICD-10-CM

## 2020-07-10 MED ORDER — ISOSORBIDE DINITRATE 30 MG PO TABS: 15.0000 mg | ORAL_TABLET | Freq: Two times a day (BID) | ORAL | 3 refills | Status: DC

## 2020-07-10 NOTE — Addendum Note (Signed)
Addended by: Carylon Perches on: 07/10/2020 02:53 PM   Modules accepted: Orders

## 2020-07-10 NOTE — Patient Instructions (Signed)
Medication Instructions:  Your physician has recommended you make the following change in your medication:   STOP: Irbesartan (Avapro) START: Isosorbide 30mg  1/2 tablet twice daily  *If you need a refill on your cardiac medications before your next appointment, please call your pharmacy*   Lab Work: BMET, CBC (Will be done by [facility])  If you have labs (blood work) drawn today and your tests are completely normal, you will receive your results only by: Marland Kitchen MyChart Message (if you have MyChart) OR . A paper copy in the mail If you have any lab test that is abnormal or we need to change your treatment, we will call you to review the results.   Follow-Up: At [facility], you and your health needs are our priority.  As part of our continuing mission to provide you with exceptional heart care, we have created designated Provider Care Teams.  These Care Teams include your primary Cardiologist (physician) and Advanced Practice Providers (APPs -  Physician Assistants and Nurse Practitioners) who all work together to provide you with the care you need, when you need it.  We recommend signing up for the patient portal called "MyChart".  Sign up information is provided on this After Visit Summary.  MyChart is used to connect with patients for Virtual Visits (Telemedicine).  Patients are able to view lab/test results, encounter notes, upcoming appointments, etc.  Non-urgent messages can be sent to your provider as well.   To learn more about what you can do with MyChart, go to [email or web address].    Your next appointment:   6 month(s)  The format for your next appointment:   In Person  Provider:   You may see Lauree Chandler, MD or one of the following Advanced Practice Providers on your designated Care Team:    Melina Copa, PA-C  Ermalinda Barrios, PA-C    Other [place] will contact you to set up a time for them to come out and check your Blood Pressure  and draw labwork.

## 2020-07-14 ENCOUNTER — Other Ambulatory Visit: Payer: Self-pay

## 2020-07-14 ENCOUNTER — Telehealth: Payer: Self-pay

## 2020-07-14 DIAGNOSIS — Z8719 Personal history of other diseases of the digestive system: Secondary | ICD-10-CM

## 2020-07-14 DIAGNOSIS — I480 Paroxysmal atrial fibrillation: Secondary | ICD-10-CM

## 2020-07-14 NOTE — Telephone Encounter (Signed)
[facility] will be going out to pt's home to draw labs and check BP. I have faxed notes and lab order to [phone].

## 2020-07-15 NOTE — Telephone Encounter (Signed)
[name], [name], [place] Phone Number: [phone]   Good Morning! We are scheduled to see [name] this friday,07/17/2020. We wanted to schedule for today but she did not want to see [place] until friday!

## 2020-07-17 DIAGNOSIS — K922 Gastrointestinal hemorrhage, unspecified: Secondary | ICD-10-CM | POA: Diagnosis not present

## 2020-07-17 DIAGNOSIS — I480 Paroxysmal atrial fibrillation: Secondary | ICD-10-CM | POA: Diagnosis not present

## 2020-07-29 ENCOUNTER — Other Ambulatory Visit: Payer: Self-pay | Admitting: Cardiovascular Disease

## 2020-07-30 ENCOUNTER — Other Ambulatory Visit: Payer: Self-pay | Admitting: Cardiovascular Disease

## 2020-08-30 ENCOUNTER — Other Ambulatory Visit: Payer: Self-pay | Admitting: Family Medicine

## 2020-09-06 ENCOUNTER — Other Ambulatory Visit: Payer: Self-pay | Admitting: Family Medicine

## 2020-09-06 DIAGNOSIS — K219 Gastro-esophageal reflux disease without esophagitis: Secondary | ICD-10-CM

## 2020-09-08 ENCOUNTER — Encounter: Payer: Self-pay | Admitting: Family Medicine

## 2020-09-08 ENCOUNTER — Telehealth (INDEPENDENT_AMBULATORY_CARE_PROVIDER_SITE_OTHER): Payer: Medicare Other | Admitting: Family Medicine

## 2020-09-08 VITALS — BP 107/59 | HR 58 | Temp 98.7°F | Wt 90.0 lb

## 2020-09-08 DIAGNOSIS — R627 Adult failure to thrive: Secondary | ICD-10-CM

## 2020-09-08 DIAGNOSIS — I25119 Atherosclerotic heart disease of native coronary artery with unspecified angina pectoris: Secondary | ICD-10-CM

## 2020-09-08 NOTE — Progress Notes (Signed)
   Subjective:    Patient ID: [name], female    DOB: Apr 30, 1929, 87 y.o.   MRN: [id number]  HPI Documentation for virtual audio telecommunications through [place] encounter: The patient was located at home. 2 patient identifiers used.  The provider was located in the office. The patient did consent to this visit and is aware of possible charges through their insurance for this visit. The other persons participating in this telemedicine service was her daughter Time spent on call was 5 minutes and in review of previous records >20 minutes total for counseling and coordination of care. This virtual service is not related to other E/M service within previous 7 days. She was supposed to set up a visit for general follow-up but states that she is unable to ambulate.  Very difficult to get a good history from her as she tends to stammer and change the subject.  Apparently she has had [facility] health come out from [facility] and help with physical activities and she is apparently using a walker but less so than in the past.  She states that her legs give way.  She is doing leg exercises twice per week.  There is question of shortness of breath with the exercises but again difficult to get a good history from either her or her daughter.  Review of Systems     Objective:   Physical Exam Alert and in no distress otherwise not examined       Assessment & Plan:  Failure to thrive in adult She is a very difficult person to take care of as she tends to ramble on and change the subject and never give a clear answer.  I will arrange for home health to visit to assess her needs.

## 2020-09-15 ENCOUNTER — Other Ambulatory Visit: Payer: Self-pay | Admitting: Family Medicine

## 2020-09-21 ENCOUNTER — Other Ambulatory Visit: Payer: Self-pay | Admitting: Family Medicine

## 2020-10-13 ENCOUNTER — Other Ambulatory Visit: Payer: Self-pay | Admitting: *Deleted

## 2020-10-13 NOTE — Patient Outreach (Signed)
[name]) Care Management  10/13/2020  [name] May 16, 1929 [id number]  RN Health Coach telephone call to patient and daughter [name].  [employer] compliance verified. RN made patient and daughter aware of the services provided thru [facility]. Patient is able to afford her medication and have very little co pay of $3.00 at the most. Patient does not need Pharmacy, Social worker or RN services at this time but would like to receive a brochure.  Plan: RN will send unsuccessful outreach letter RN will send brochure on services.  [facility] [phone]

## 2020-11-10 ENCOUNTER — Encounter (HOSPITAL_COMMUNITY): Payer: Medicare Other

## 2020-12-04 ENCOUNTER — Other Ambulatory Visit: Payer: Self-pay | Admitting: Family Medicine

## 2020-12-04 DIAGNOSIS — K219 Gastro-esophageal reflux disease without esophagitis: Secondary | ICD-10-CM

## 2020-12-27 ENCOUNTER — Other Ambulatory Visit: Payer: Self-pay | Admitting: Family Medicine

## 2021-01-03 ENCOUNTER — Other Ambulatory Visit: Payer: Self-pay | Admitting: Cardiovascular Disease

## 2021-01-06 ENCOUNTER — Other Ambulatory Visit: Payer: Self-pay

## 2021-01-06 ENCOUNTER — Encounter: Payer: Self-pay | Admitting: Family Medicine

## 2021-01-06 ENCOUNTER — Ambulatory Visit (INDEPENDENT_AMBULATORY_CARE_PROVIDER_SITE_OTHER): Payer: Medicare Other | Admitting: Family Medicine

## 2021-01-06 VITALS — BP 130/72 | HR 78 | Temp 98.4°F

## 2021-01-06 DIAGNOSIS — J45909 Unspecified asthma, uncomplicated: Secondary | ICD-10-CM | POA: Diagnosis not present

## 2021-01-06 DIAGNOSIS — I25119 Atherosclerotic heart disease of native coronary artery with unspecified angina pectoris: Secondary | ICD-10-CM | POA: Diagnosis not present

## 2021-01-06 DIAGNOSIS — K4090 Unilateral inguinal hernia, without obstruction or gangrene, not specified as recurrent: Secondary | ICD-10-CM

## 2021-01-06 NOTE — Progress Notes (Signed)
   Subjective:    Patient ID: [name], female    DOB: 27-Jan-1929, 87 y.o.   MRN: [id number]  HPI She is here for evaluation of difficulty with intermittent shortness of breath.  This is been going on for the last month.  She is supposed to be on Flovent 2 puffs twice per day but discussion with the daughter and with her indicates she is only doing this once per day.  The daughter is keeping track of her pulse ox level and only 1 time did not go below 92.  She then describes difficulty with swelling and pain in the right inguinal area which interferes with her ability to do her exercises.  She states that she is not able to walk like she wants but admits that she is not doing the therapy that was recommended.   Review of Systems     Objective:   Physical Exam Alert and in no distress.  Lungs are clear to auscultation.  Cardiac exam shows regular rhythm without murmurs or gallops.  Abdominal exam does show a inguinal hernia on the right.       Assessment & Plan:  Uncomplicated asthma, unspecified asthma severity, unspecified whether persistent  Non-recurrent unilateral inguinal hernia without obstruction or gangrene Reviewed multiple times the need for her to take 2 puffs twice per day for her Flovent and see how she does on that and if no improvement may need referral. Encouraged her to become much more physically active and if the lump in her abdomen is causing trouble, I will refer her to general surgery.  They will call me if the need that done.

## 2021-01-06 NOTE — Patient Instructions (Signed)
Take 2 puffs twice per day of the Flovent.  Do your physical therapy.  Call me when you want referral to the general surgeons.

## 2021-01-13 ENCOUNTER — Other Ambulatory Visit: Payer: Self-pay

## 2021-01-13 ENCOUNTER — Encounter (HOSPITAL_COMMUNITY): Payer: Self-pay

## 2021-01-13 ENCOUNTER — Emergency Department (HOSPITAL_COMMUNITY): Payer: Medicare Other

## 2021-01-13 ENCOUNTER — Emergency Department (HOSPITAL_COMMUNITY)
Admission: EM | Admit: 2021-01-13 | Discharge: 2021-01-14 | Disposition: A | Discharge status: Home / Self Care | Payer: Medicare Other | Source: Home / Self Care | Attending: Emergency Medicine | Admitting: Emergency Medicine

## 2021-01-13 DIAGNOSIS — Z951 Presence of aortocoronary bypass graft: Secondary | ICD-10-CM | POA: Insufficient documentation

## 2021-01-13 DIAGNOSIS — K8070 Calculus of gallbladder and bile duct without cholecystitis without obstruction: Secondary | ICD-10-CM | POA: Diagnosis not present

## 2021-01-13 DIAGNOSIS — Z8679 Personal history of other diseases of the circulatory system: Secondary | ICD-10-CM

## 2021-01-13 DIAGNOSIS — Z20822 Contact with and (suspected) exposure to covid-19: Secondary | ICD-10-CM | POA: Diagnosis not present

## 2021-01-13 DIAGNOSIS — I455 Other specified heart block: Secondary | ICD-10-CM | POA: Diagnosis not present

## 2021-01-13 DIAGNOSIS — F419 Anxiety disorder, unspecified: Secondary | ICD-10-CM | POA: Diagnosis not present

## 2021-01-13 DIAGNOSIS — I2510 Atherosclerotic heart disease of native coronary artery without angina pectoris: Secondary | ICD-10-CM | POA: Insufficient documentation

## 2021-01-13 DIAGNOSIS — I495 Sick sinus syndrome: Secondary | ICD-10-CM | POA: Diagnosis not present

## 2021-01-13 DIAGNOSIS — I10 Essential (primary) hypertension: Secondary | ICD-10-CM | POA: Insufficient documentation

## 2021-01-13 DIAGNOSIS — I480 Paroxysmal atrial fibrillation: Secondary | ICD-10-CM | POA: Insufficient documentation

## 2021-01-13 DIAGNOSIS — R110 Nausea: Secondary | ICD-10-CM | POA: Diagnosis not present

## 2021-01-13 DIAGNOSIS — R0602 Shortness of breath: Secondary | ICD-10-CM | POA: Diagnosis not present

## 2021-01-13 DIAGNOSIS — J45909 Unspecified asthma, uncomplicated: Secondary | ICD-10-CM | POA: Insufficient documentation

## 2021-01-13 DIAGNOSIS — R54 Age-related physical debility: Secondary | ICD-10-CM | POA: Diagnosis not present

## 2021-01-13 DIAGNOSIS — R519 Headache, unspecified: Secondary | ICD-10-CM | POA: Insufficient documentation

## 2021-01-13 DIAGNOSIS — Z87891 Personal history of nicotine dependence: Secondary | ICD-10-CM | POA: Insufficient documentation

## 2021-01-13 DIAGNOSIS — Z7901 Long term (current) use of anticoagulants: Secondary | ICD-10-CM | POA: Insufficient documentation

## 2021-01-13 DIAGNOSIS — I4891 Unspecified atrial fibrillation: Secondary | ICD-10-CM | POA: Diagnosis not present

## 2021-01-13 DIAGNOSIS — J439 Emphysema, unspecified: Secondary | ICD-10-CM | POA: Diagnosis not present

## 2021-01-13 DIAGNOSIS — I959 Hypotension, unspecified: Secondary | ICD-10-CM | POA: Diagnosis not present

## 2021-01-13 DIAGNOSIS — R457 State of emotional shock and stress, unspecified: Secondary | ICD-10-CM | POA: Diagnosis not present

## 2021-01-13 DIAGNOSIS — R55 Syncope and collapse: Secondary | ICD-10-CM | POA: Diagnosis not present

## 2021-01-13 DIAGNOSIS — Z8709 Personal history of other diseases of the respiratory system: Secondary | ICD-10-CM

## 2021-01-13 DIAGNOSIS — E43 Unspecified severe protein-calorie malnutrition: Secondary | ICD-10-CM | POA: Diagnosis not present

## 2021-01-13 DIAGNOSIS — G4489 Other headache syndrome: Secondary | ICD-10-CM | POA: Diagnosis not present

## 2021-01-13 LAB — BASIC METABOLIC PANEL
Anion gap: 9 (ref 5–15)
BUN: 9 mg/dL (ref 8–23)
CO2: 28 mmol/L (ref 22–32)
Calcium: 9.5 mg/dL (ref 8.9–10.3)
Chloride: 99 mmol/L (ref 98–111)
Creatinine, Ser: 0.53 mg/dL (ref 0.44–1.00)
GFR, Estimated: >60 mL/min (ref >60)
Glucose, Bld: 133 mg/dL — ABNORMAL HIGH (ref 70–99)
Potassium: 3.6 mmol/L (ref 3.5–5.1)
Sodium: 136 mmol/L (ref 135–145)

## 2021-01-13 LAB — CBC
HCT: 36.3 % (ref 36.0–46.0)
Hemoglobin: 12.1 g/dL (ref 12.0–15.0)
MCH: 30.6 pg (ref 26.0–34.0)
MCHC: 33.3 g/dL (ref 30.0–36.0)
MCV: 91.9 fL (ref 80.0–100.0)
Platelets: 186 10*3/uL (ref 150–400)
RBC: 3.95 MIL/uL (ref 3.87–5.11)
RDW: 13.4 % (ref 11.5–15.5)
WBC: 4.5 10*3/uL (ref 4.0–10.5)
nRBC: 0 % (ref 0.0–0.2)

## 2021-01-13 MED ORDER — ALBUTEROL SULFATE HFA 108 (90 BASE) MCG/ACT IN AERS
2.0000 | INHALATION_SPRAY | Freq: Once | RESPIRATORY_TRACT | Status: AC
  Administered 2021-01-13: 2 via RESPIRATORY_TRACT
  Filled 2021-01-13: qty 6.7

## 2021-01-13 NOTE — ED Triage Notes (Signed)
Pt bib [facility] from home where she lives with daughter.  Pt had a headache and nausea this am. EMS reports pt felt anxious and short of breath throughout the day, calling EMS this evening.  EMS reports pt had rapid and pressured speech on their arrival. Pt denies pain and shortness of breath on arrival.   EMS VS: 150/72 HR=80 afib (hx of same) 02=98% on room air

## 2021-01-13 NOTE — Discharge Instructions (Addendum)
It was our pleasure to provide your ER care today - we hope that you feel better.  Overall, your lab and xray results look good.   Get adequate rest. Make sure to drink plenty of fluids/stay well hydrated.   Use ventolin/albuterol treatment as need.   Follow up with primary care doctor in the coming week.  Return to ER if worse, new symptoms, fevers, chest pain, trouble breathing, or other concern.

## 2021-01-13 NOTE — ED Provider Notes (Signed)
[facility] EMERGENCY DEPARTMENT Provider Note   CSN: [id number] Arrival date & time: 01/13/21  2035     History Chief Complaint  Patient presents with   Anxiety   Shortness of Breath    [name] is a 87 y.o. female.  Patient c/o sob earlier - pt currently denies sob. Symptoms acute onset, mild, at rest, now resolved. Denies chest pain or discomfort. No cough or uri symptoms. No fever or chills. States compliant w normal meds. Denies leg pain or swelling. Normal appetite. No abd pain or nvd. No dysuria or gu c/o. Pt limited historian - level 5 caveat.     The history is provided by the patient. The history is limited by the condition of the patient.  Headache Associated symptoms: no abdominal pain, no back pain, no cough, no fever, no neck pain, no numbness, no sore throat, no vomiting and no weakness   Anxiety Associated symptoms include shortness of breath. Pertinent negatives include no chest pain, no abdominal pain and no headaches.      Past Medical History:  Diagnosis Date   Anxiety    Aortic Stenosis s/p TAVR    Echo 10/21: EF 55-60, no RWMA, mild asymmetric LVH, normal RVSF, RVSP 40.2 mmHg, mild MR, s/p TAVR with mean gradient 8.65mHg, no PVL   Arthritis    OSTEO   Aspirin allergy    on Plavix   Asthma    Bronchitis    CAD (coronary artery disease)    a. s/p CABG 2006 with Cox-Maze procedure.   [place]    a. s/p R CEA.   Chronic stable angina (HCC)    Diastolic dysfunction    Eczema    Fibromyalgia    GERD (gastroesophageal reflux disease)    Heart murmur    Hiatal hernia    Hyperlipidemia    Hypertension    Kyphoscoliosis    Mitral regurgitation    Myocardial infarction ([facility] 2003   Stent to CFX   PAF (paroxysmal atrial fibrillation) ([place]    a. pt has h/o hematuria on Eliquis and has since refused anticoagulation   Pneumonia 2015 ?   Pulmonary regurgitation    Skin cancer of arm    Stroke ([facility]     Tricuspid regurgitation     Patient Active Problem List   Diagnosis Date Noted   GI bleed 04/22/2020   Presbycusis of both ears 11/13/2019   Vertigo 03/27/2017   Arthritis 03/27/2017   [place]    Status post transcatheter aortic valve replacement (TAVR) using bioprosthesis 01/03/2017   Chronic apical periodontitis 12/28/2016   Retained dental roots 12/28/2016   Bilateral maxillary lateral exostoses [id number]  Diastolic dysfunction    Chronic stable angina (HCC)    Tricuspid regurgitation    Pulmonary regurgitation    Left shoulder pain    Back pain-similar to pt's angina 12/01/2015   Carotid artery disease- s/p RCE 12/01/2015   Bowen's disease 03/16/2015   Dependent edema 10/30/2014   History of allergy to aspirin 06/02/2014   PA[place]mal [place] fibrillation) [place]    Gastroesophageal reflux disease without esophagitis 12/05/2011   Hx of CABG 2006 with Maze 07/21/2011   Mitral regurgitation 07/21/2011   Allergic rhinitis 10/05/2010   Hyperlipidemia [id number]  Uncomplicated asthma [id number]   Past Surgical History:  [place]   [place]  CAROTID ENDAR[place]ECTOMY  2010   CORONARY ANGIOPLASTY WITH STENT PLACEMENT  CORONARY ARTERY BYPASS GRAFT  01/2005   LIMA-D1; SVG-LAD; SVG-OM; SVG-PDA   EYE SURGERY     MULTIPLE EXTRACTIONS WITH ALVEOLOPLASTY  12/28/2016   Extraction of tooth #'s 2- 5,7-10, 12,13,17-20,and 22-29 with alveoloplasty and maxillary right and left lateral exostoses reductions   MULTIPLE EXTRACTIONS WITH ALVEOLOPLASTY N/A 12/28/2016   Procedure: Extraction of tooth #'s 2- 5,7-10, 12,13,17-20,and 22-29 with alveoloplasty and maxillary right and left lateral exostoses reductions;  Surgeon: Lenn Cal, DDS;  Location: [place];  Service: Oral Surgery;  Laterality: N/A;   RIGHT/LEFT HEART CATH AND CORONARY/GRAFT ANGIOGRAPHY N/A 10/27/2016   Procedure: Right/Left Heart Cath and Coronary/Graft Angiography;   Surgeon: Burnell Blanks, MD;  Location: [place] CV LAB;  Service: Cardiovascular;  Laterality: N/A;   TEE WITHOUT CARDIOVERSION N/A 01/03/2017   Procedure: TRANSESOPHAGEAL ECHOCARDIOGRAM (TEE);  Surgeon: Burnell Blanks, MD;  Location: [place];  Service: Open Heart Surgery;  Laterality: N/A;   TONSILLECTOMY     TRANSCATHETER AORTIC VALVE REPLACEMENT, TRANSFEMORAL N/A 01/03/2017   Procedure: TRANSCATHETER AORTIC VALVE REPLACEMENT, TRANSFEMORAL;  Surgeon: Burnell Blanks, MD;  Location: [place];  Service: Open Heart Surgery;  Laterality: N/A;   TUMOR REMOVAL       OB History   No obstetric history on file.     Family History  Problem Relation Age of Onset   Heart failure Mother    Other Father     Social History   Tobacco Use   Smoking status: Never   Smokeless tobacco: Former    Types: Snuff    Quit date: 05/23/2001  Vaping Use   Vaping Use: Never used  Substance Use Topics   Alcohol use: No   Drug use: No    Home Medications Prior to Admission medications   Medication Sig Start Date End Date Taking? Authorizing Provider  acetaminophen (TYLENOL) 650 MG CR tablet Take 1,300 mg by mouth 2 (two) times daily as needed for pain.     [provider]  Cholecalciferol (VITAMIN D-3) 25 MCG (1000 UT) CAPS Take 1,000 Units by mouth daily.    [provider]  diazepam (VALIUM) 2 MG tablet Take 1 tablet (2 mg total) by mouth every 6 (six) hours as needed for anxiety. 11/02/18   Denita Lung, MD  docusate sodium (COLACE) 100 MG capsule Take 1 capsule (100 mg total) by mouth daily. 04/27/20   Mercy Riding, MD  ELIQUIS 2.5 MG TABS tablet TAKE 1 TABLET(2.5 MG) BY MOUTH TWICE DAILY 06/24/20   Burnell Blanks, MD  Ensure Plus (ENSURE PLUS) LIQD Take 237 mLs by mouth 2 (two) times daily between meals.    [provider]  FLOVENT HFA 220 MCG/ACT inhaler INHALE 2 PUFFS INTO THE LUNGS TWICE DAILY 11/18/19   Denita Lung, MD  fluticasone  [facility]) 50 MCG/ACT nasal spray SHAKE LIQUID AND USE 2 SPRAYS IN EACH NOSTRIL EVERY DAY 09/01/20   Denita Lung, MD  guaiFENesin (MUCINEX) 600 MG 12 hr tablet Take 1 tablet (600 mg total) by mouth 2 (two) times daily. 05/25/14   Regalado, Belkys A, MD  guaiFENesin (ROBITUSSIN) 100 MG/5ML liquid Take 100 mg by mouth 2 (two) times daily as needed for cough or congestion. Reported on 12/08/2015    [provider]  isosorbide dinitrate (ISORDIL) 30 MG tablet Take 0.5 tablets (15 mg total) by mouth 2 (two) times daily. 07/10/20   Richardson Dopp T, PA-C  LINZESS 72 MCG capsule Take 72 mcg by mouth  daily. 11/24/20   [provider]  loratadine (CLARITIN) 10 MG tablet Take 10 mg by mouth daily.      [provider]  metoprolol tartrate (LOPRESSOR) 25 MG tablet TAKE 1/2 TABLET BY MOUTH TWICE DAILY 09/15/20   Denita Lung, MD  nitroGLYCERIN (NITROSTAT) 0.4 MG SL tablet DISSOLVE 1 TABLET UNDER TONGUE EVERY 5 MINUTES AS NEEDED FOR CHEST PAIN 01/04/21   Burnell Blanks, MD  pantoprazole (PROTONIX) 40 MG tablet TAKE 1 TABLET BY MOUTH TWICE DAILY 12/04/20   Denita Lung, MD  polyethylene glycol powder (MIRALAX) 17 GM/SCOOP powder Take 17 g by mouth 2 (two) times daily as needed for moderate constipation. Patient not taking: Reported on 01/06/2021 04/27/20   Mercy Riding, MD  rosuvastatin (CRESTOR) 40 MG tablet TAKE 1 TABLET BY MOUTH EVERY DAY 12/28/20   Denita Lung, MD  triamcinolone (KENALOG) 0.1 % Apply 1 application topically 2 (two) times daily. Patient not taking: No sig reported 05/21/20   Denita Lung, MD  VENTOLIN HFA 108 [phone] Base) MCG/ACT inhaler INHALE 2 PUFFS BY MOUTH EVERY 6 HOURS AS NEEDED FOR WHEEZING OR SHORTNESS OF BREATH 02/12/20   Denita Lung, MD    Allergies    Other, Bactrim [sulfamethoxazole-trimethoprim], Amitriptyline hcl, Aspirin, Tape, and Zetia [ezetimibe]  Review of Systems   Review of Systems  Constitutional:  Negative for chills and fever.   HENT:  Negative for sore throat.   Eyes:  Negative for redness.  Respiratory:  Positive for shortness of breath. Negative for cough.   Cardiovascular:  Negative for chest pain.  Gastrointestinal:  Negative for abdominal pain, constipation and vomiting.  Genitourinary:  Negative for flank pain.  Musculoskeletal:  Negative for back pain and neck pain.  Skin:  Negative for rash.  Neurological:  Negative for weakness, numbness and headaches.  Hematological:  Does not bruise/bleed easily.  Psychiatric/Behavioral:  Negative for agitation.    Physical Exam Updated Vital Signs BP (!) 144/101   Pulse 69   Temp 98.2 F (36.8 C) (Oral)   Resp (!) 23   Ht 1.575 m ('5\' 2"'$ )   Wt 40.8 kg   SpO2 97%   BMI 16.46 kg/m   Physical Exam Vitals and nursing note reviewed.  Constitutional:      Appearance: Normal appearance. She is well-developed.  HENT:     Head: Atraumatic.     Nose: Nose normal.     Mouth/Throat:     Mouth: Mucous membranes are moist.  Eyes:     General: No scleral icterus.    Conjunctiva/sclera: Conjunctivae normal.     Pupils: Pupils are equal, round, and reactive to light.  Neck:     Trachea: No tracheal deviation.  Cardiovascular:     Rate and Rhythm: Normal rate and regular rhythm.     Pulses: Normal pulses.     Heart sounds: Normal heart sounds. No murmur heard.   No friction rub. No gallop.  Pulmonary:     Effort: Pulmonary effort is normal. No respiratory distress.     Comments: ?slight wheeze.  Abdominal:     General: Bowel sounds are normal. There is no distension.     Palpations: Abdomen is soft.     Tenderness: There is no abdominal tenderness. There is no guarding.  Genitourinary:    Comments: No cva tenderness.  Musculoskeletal:        General: No swelling or tenderness.     Cervical back: Normal range of motion  and neck supple. No rigidity. No muscular tenderness.     Right lower leg: No edema.     Left lower leg: No edema.  Skin:    General:  Skin is warm and dry.     Findings: No rash.  Neurological:     Mental Status: She is alert.     Comments: Alert, speech normal. Motor/sens grossly intact bil.   Psychiatric:        Mood and Affect: Mood normal.    ED Results / Procedures / Treatments   Labs (all labs ordered are listed, but only abnormal results are displayed) Results for orders placed or performed during the hospital encounter of 01/13/21  CBC  Result Value Ref Range   WBC 4.5 4.0 - 10.5 K/uL   RBC 3.95 3.87 - 5.11 MIL/uL   Hemoglobin 12.1 12.0 - 15.0 g/dL   HCT 36.3 36.0 - 46.0 %   MCV 91.9 80.0 - 100.0 fL   MCH 30.6 26.0 - 34.0 pg   MCHC 33.3 30.0 - 36.0 g/dL   RDW 13.4 11.5 - 15.5 %   Platelets 186 150 - 400 K/uL   nRBC 0.0 0.0 - 0.2 %   DG Chest Port 1 View  Result Date: 01/13/2021 CLINICAL DATA:  Headache and nausea this morning. Anxious and short of breath throughout the day. EXAM: PORTABLE CHEST 1 VIEW COMPARISON:  04/23/2020 FINDINGS: Postoperative changes in the mediastinum. Normal heart size and pulmonary vascularity. Cardiac valve prosthesis. Emphysematous changes in the lungs. Fibrosis in the upper lungs. Peribronchial thickening suggesting bronchitic change. Calcified granuloma in the right base. Mild blunting of the right costophrenic angle is unchanged since prior study, likely pleural thickening. Calcification of the aorta. Thoracolumbar scoliosis with degenerative change. IMPRESSION: Emphysematous and chronic bronchitic changes in the lungs. Fibrosis in the apices. Right pleural thickening. No active pulmonary disease. Electronically Signed   By: Lucienne Capers M.D.   On: 01/13/2021 21:55    EKG EKG Interpretation  Date/Time:  Wednesday January 13 2021 20:54:38 EDT Ventricular Rate:  65 PR Interval:    QRS Duration: 88 QT Interval:  421 QTC Calculation: 438 R Axis:   65 Text Interpretation: Atrial fibrillation Consider left ventricular hypertrophy Confirmed by Lajean Saver [id number]) on  01/13/2021 9:29:29 PM  Radiology DG Chest Port 1 View  Result Date: 01/13/2021 CLINICAL DATA:  Headache and nausea this morning. Anxious and short of breath throughout the day. EXAM: PORTABLE CHEST 1 VIEW COMPARISON:  04/23/2020 FINDINGS: Postoperative changes in the mediastinum. Normal heart size and pulmonary vascularity. Cardiac valve prosthesis. Emphysematous changes in the lungs. Fibrosis in the upper lungs. Peribronchial thickening suggesting bronchitic change. Calcified granuloma in the right base. Mild blunting of the right costophrenic angle is unchanged since prior study, likely pleural thickening. Calcification of the aorta. Thoracolumbar scoliosis with degenerative change. IMPRESSION: Emphysematous and chronic bronchitic changes in the lungs. Fibrosis in the apices. Right pleural thickening. No active pulmonary disease. Electronically Signed   By: Lucienne Capers M.D.   On: 01/13/2021 21:55    Procedures Procedures   Medications Ordered in ED Medications  albuterol (VENTOLIN HFA) 108 (90 Base) MCG/ACT inhaler 2 puff (has no administration in time range)    ED Course  I have reviewed the triage vital signs and the nursing notes.  Pertinent labs & imaging results that were available during my care of the patient were reviewed by me and considered in my medical decision making (see chart for details).    MDM  Rules/Calculators/A&P                          Iv ns. Continuous pulse ox and cardiac monitoring. Labs. Imaging.  Reviewed nursing notes and prior charts for additional history.   Patient currently denies sob. Denies chest pain or discomfort.   Labs reviewed/interpreted by me - wbc and hgb normal.   CXR reviewed/interpreted by me - no pna.   Recheck pt, no increased wob, no cp. Pulse ox 99%.   Pt currently appears stable for d/c.   Rec pcp f/u. Return precautions provided.     Final Clinical Impression(s) / ED Diagnoses Final diagnoses:  None    Rx / DC  Orders ED Discharge Orders     None        Lajean Saver, MD 01/13/21 2315

## 2021-01-14 ENCOUNTER — Emergency Department (HOSPITAL_COMMUNITY): Payer: Medicare Other

## 2021-01-14 ENCOUNTER — Inpatient Hospital Stay (HOSPITAL_COMMUNITY)
Admission: EM | Admit: 2021-01-14 | Discharge: 2021-01-19 | DRG: 242 | Disposition: A | Discharge status: Skilled Nursing Facility | Payer: Medicare Other | Attending: Internal Medicine | Admitting: Internal Medicine

## 2021-01-14 DIAGNOSIS — Z720 Tobacco use: Secondary | ICD-10-CM

## 2021-01-14 DIAGNOSIS — R6881 Early satiety: Secondary | ICD-10-CM | POA: Diagnosis present

## 2021-01-14 DIAGNOSIS — J069 Acute upper respiratory infection, unspecified: Secondary | ICD-10-CM | POA: Diagnosis not present

## 2021-01-14 DIAGNOSIS — R55 Syncope and collapse: Secondary | ICD-10-CM | POA: Diagnosis not present

## 2021-01-14 DIAGNOSIS — R6251 Failure to thrive (child): Secondary | ICD-10-CM | POA: Diagnosis not present

## 2021-01-14 DIAGNOSIS — M1990 Unspecified osteoarthritis, unspecified site: Secondary | ICD-10-CM | POA: Diagnosis present

## 2021-01-14 DIAGNOSIS — Z681 Body mass index (BMI) 19 or less, adult: Secondary | ICD-10-CM | POA: Diagnosis not present

## 2021-01-14 DIAGNOSIS — K5900 Constipation, unspecified: Secondary | ICD-10-CM | POA: Diagnosis present

## 2021-01-14 DIAGNOSIS — K8020 Calculus of gallbladder without cholecystitis without obstruction: Secondary | ICD-10-CM | POA: Diagnosis not present

## 2021-01-14 DIAGNOSIS — K2970 Gastritis, unspecified, without bleeding: Secondary | ICD-10-CM | POA: Diagnosis present

## 2021-01-14 DIAGNOSIS — Z881 Allergy status to other antibiotic agents status: Secondary | ICD-10-CM

## 2021-01-14 DIAGNOSIS — E785 Hyperlipidemia, unspecified: Secondary | ICD-10-CM | POA: Diagnosis present

## 2021-01-14 DIAGNOSIS — I447 Left bundle-branch block, unspecified: Secondary | ICD-10-CM | POA: Diagnosis present

## 2021-01-14 DIAGNOSIS — I4891 Unspecified atrial fibrillation: Secondary | ICD-10-CM | POA: Diagnosis not present

## 2021-01-14 DIAGNOSIS — F419 Anxiety disorder, unspecified: Secondary | ICD-10-CM | POA: Diagnosis present

## 2021-01-14 DIAGNOSIS — E869 Volume depletion, unspecified: Secondary | ICD-10-CM | POA: Diagnosis present

## 2021-01-14 DIAGNOSIS — R54 Age-related physical debility: Secondary | ICD-10-CM | POA: Diagnosis present

## 2021-01-14 DIAGNOSIS — J90 Pleural effusion, not elsewhere classified: Secondary | ICD-10-CM | POA: Diagnosis not present

## 2021-01-14 DIAGNOSIS — R001 Bradycardia, unspecified: Secondary | ICD-10-CM | POA: Diagnosis not present

## 2021-01-14 DIAGNOSIS — E876 Hypokalemia: Secondary | ICD-10-CM | POA: Diagnosis present

## 2021-01-14 DIAGNOSIS — D649 Anemia, unspecified: Secondary | ICD-10-CM | POA: Diagnosis present

## 2021-01-14 DIAGNOSIS — I491 Atrial premature depolarization: Secondary | ICD-10-CM | POA: Diagnosis present

## 2021-01-14 DIAGNOSIS — I10 Essential (primary) hypertension: Secondary | ICD-10-CM | POA: Diagnosis present

## 2021-01-14 DIAGNOSIS — Z8249 Family history of ischemic heart disease and other diseases of the circulatory system: Secondary | ICD-10-CM

## 2021-01-14 DIAGNOSIS — K838 Other specified diseases of biliary tract: Secondary | ICD-10-CM | POA: Diagnosis not present

## 2021-01-14 DIAGNOSIS — Z79899 Other long term (current) drug therapy: Secondary | ICD-10-CM

## 2021-01-14 DIAGNOSIS — I4430 Unspecified atrioventricular block: Secondary | ICD-10-CM | POA: Diagnosis not present

## 2021-01-14 DIAGNOSIS — Z888 Allergy status to other drugs, medicaments and biological substances status: Secondary | ICD-10-CM

## 2021-01-14 DIAGNOSIS — Z95818 Presence of other cardiac implants and grafts: Secondary | ICD-10-CM

## 2021-01-14 DIAGNOSIS — Z7901 Long term (current) use of anticoagulants: Secondary | ICD-10-CM

## 2021-01-14 DIAGNOSIS — Z91048 Other nonmedicinal substance allergy status: Secondary | ICD-10-CM

## 2021-01-14 DIAGNOSIS — J984 Other disorders of lung: Secondary | ICD-10-CM | POA: Diagnosis not present

## 2021-01-14 DIAGNOSIS — I495 Sick sinus syndrome: Principal | ICD-10-CM | POA: Diagnosis present

## 2021-01-14 DIAGNOSIS — J45909 Unspecified asthma, uncomplicated: Secondary | ICD-10-CM | POA: Diagnosis present

## 2021-01-14 DIAGNOSIS — R531 Weakness: Secondary | ICD-10-CM | POA: Diagnosis not present

## 2021-01-14 DIAGNOSIS — M6281 Muscle weakness (generalized): Secondary | ICD-10-CM | POA: Diagnosis not present

## 2021-01-14 DIAGNOSIS — I480 Paroxysmal atrial fibrillation: Secondary | ICD-10-CM | POA: Diagnosis present

## 2021-01-14 DIAGNOSIS — R110 Nausea: Secondary | ICD-10-CM

## 2021-01-14 DIAGNOSIS — M797 Fibromyalgia: Secondary | ICD-10-CM | POA: Diagnosis present

## 2021-01-14 DIAGNOSIS — Z8701 Personal history of pneumonia (recurrent): Secondary | ICD-10-CM

## 2021-01-14 DIAGNOSIS — I9589 Other hypotension: Secondary | ICD-10-CM | POA: Diagnosis not present

## 2021-01-14 DIAGNOSIS — R627 Adult failure to thrive: Secondary | ICD-10-CM | POA: Diagnosis present

## 2021-01-14 DIAGNOSIS — R935 Abnormal findings on diagnostic imaging of other abdominal regions, including retroperitoneum: Secondary | ICD-10-CM

## 2021-01-14 DIAGNOSIS — R0789 Other chest pain: Secondary | ICD-10-CM | POA: Diagnosis not present

## 2021-01-14 DIAGNOSIS — E43 Unspecified severe protein-calorie malnutrition: Secondary | ICD-10-CM | POA: Diagnosis present

## 2021-01-14 DIAGNOSIS — K219 Gastro-esophageal reflux disease without esophagitis: Secondary | ICD-10-CM | POA: Diagnosis present

## 2021-01-14 DIAGNOSIS — R079 Chest pain, unspecified: Secondary | ICD-10-CM | POA: Diagnosis not present

## 2021-01-14 DIAGNOSIS — I2510 Atherosclerotic heart disease of native coronary artery without angina pectoris: Secondary | ICD-10-CM | POA: Diagnosis present

## 2021-01-14 DIAGNOSIS — Z20822 Contact with and (suspected) exposure to covid-19: Secondary | ICD-10-CM | POA: Diagnosis present

## 2021-01-14 DIAGNOSIS — I959 Hypotension, unspecified: Secondary | ICD-10-CM | POA: Diagnosis present

## 2021-01-14 DIAGNOSIS — R2689 Other abnormalities of gait and mobility: Secondary | ICD-10-CM | POA: Diagnosis not present

## 2021-01-14 DIAGNOSIS — R059 Cough, unspecified: Secondary | ICD-10-CM

## 2021-01-14 DIAGNOSIS — R2681 Unsteadiness on feet: Secondary | ICD-10-CM | POA: Diagnosis not present

## 2021-01-14 DIAGNOSIS — Z03818 Encounter for observation for suspected exposure to other biological agents ruled out: Secondary | ICD-10-CM | POA: Diagnosis not present

## 2021-01-14 DIAGNOSIS — M419 Scoliosis, unspecified: Secondary | ICD-10-CM | POA: Diagnosis present

## 2021-01-14 DIAGNOSIS — R1312 Dysphagia, oropharyngeal phase: Secondary | ICD-10-CM | POA: Diagnosis not present

## 2021-01-14 DIAGNOSIS — K8070 Calculus of gallbladder and bile duct without cholecystitis without obstruction: Secondary | ICD-10-CM | POA: Diagnosis present

## 2021-01-14 DIAGNOSIS — R41841 Cognitive communication deficit: Secondary | ICD-10-CM | POA: Diagnosis not present

## 2021-01-14 DIAGNOSIS — I455 Other specified heart block: Secondary | ICD-10-CM

## 2021-01-14 DIAGNOSIS — Z950 Presence of cardiac pacemaker: Secondary | ICD-10-CM | POA: Diagnosis not present

## 2021-01-14 DIAGNOSIS — I4819 Other persistent atrial fibrillation: Secondary | ICD-10-CM | POA: Diagnosis present

## 2021-01-14 DIAGNOSIS — Z85828 Personal history of other malignant neoplasm of skin: Secondary | ICD-10-CM

## 2021-01-14 DIAGNOSIS — R42 Dizziness and giddiness: Secondary | ICD-10-CM | POA: Diagnosis not present

## 2021-01-14 DIAGNOSIS — K8050 Calculus of bile duct without cholangitis or cholecystitis without obstruction: Secondary | ICD-10-CM | POA: Diagnosis not present

## 2021-01-14 DIAGNOSIS — I69391 Dysphagia following cerebral infarction: Secondary | ICD-10-CM | POA: Diagnosis not present

## 2021-01-14 DIAGNOSIS — I252 Old myocardial infarction: Secondary | ICD-10-CM

## 2021-01-14 DIAGNOSIS — Z952 Presence of prosthetic heart valve: Secondary | ICD-10-CM

## 2021-01-14 DIAGNOSIS — Z90710 Acquired absence of both cervix and uterus: Secondary | ICD-10-CM

## 2021-01-14 DIAGNOSIS — Z955 Presence of coronary angioplasty implant and graft: Secondary | ICD-10-CM

## 2021-01-14 DIAGNOSIS — R279 Unspecified lack of coordination: Secondary | ICD-10-CM | POA: Diagnosis not present

## 2021-01-14 DIAGNOSIS — R52 Pain, unspecified: Secondary | ICD-10-CM

## 2021-01-14 DIAGNOSIS — Z951 Presence of aortocoronary bypass graft: Secondary | ICD-10-CM

## 2021-01-14 DIAGNOSIS — Z7401 Bed confinement status: Secondary | ICD-10-CM | POA: Diagnosis not present

## 2021-01-14 DIAGNOSIS — Z886 Allergy status to analgesic agent status: Secondary | ICD-10-CM

## 2021-01-14 DIAGNOSIS — Z8673 Personal history of transient ischemic attack (TIA), and cerebral infarction without residual deficits: Secondary | ICD-10-CM

## 2021-01-14 LAB — RESP PANEL BY RT-PCR (FLU A&B, COVID) ARPGX2
Influenza A by PCR: NEGATIVE
Influenza B by PCR: NEGATIVE
SARS Coronavirus 2 by RT PCR: NEGATIVE

## 2021-01-14 LAB — CBC WITH DIFFERENTIAL/PLATELET
Abs Immature Granulocytes: 0.02 10*3/uL (ref 0.00–0.07)
Basophils Absolute: 0 10*3/uL (ref 0.0–0.1)
Basophils Relative: 0 %
Eosinophils Absolute: 0 10*3/uL (ref 0.0–0.5)
Eosinophils Relative: 0 %
HCT: 37.4 % (ref 36.0–46.0)
Hemoglobin: 11.7 g/dL — ABNORMAL LOW (ref 12.0–15.0)
Immature Granulocytes: 0 %
Lymphocytes Relative: 8 %
Lymphs Abs: 0.5 10*3/uL — ABNORMAL LOW (ref 0.7–4.0)
MCH: 29.2 pg (ref 26.0–34.0)
MCHC: 31.3 g/dL (ref 30.0–36.0)
MCV: 93.3 fL (ref 80.0–100.0)
Monocytes Absolute: 0.4 10*3/uL (ref 0.1–1.0)
Monocytes Relative: 6 %
Neutro Abs: 5.4 10*3/uL (ref 1.7–7.7)
Neutrophils Relative %: 86 %
Platelets: 187 10*3/uL (ref 150–400)
RBC: 4.01 MIL/uL (ref 3.87–5.11)
RDW: 13.3 % (ref 11.5–15.5)
WBC: 6.4 10*3/uL (ref 4.0–10.5)
nRBC: 0 % (ref 0.0–0.2)

## 2021-01-14 LAB — CBC
HCT: 38.9 % (ref 36.0–46.0)
Hemoglobin: 12.8 g/dL (ref 12.0–15.0)
MCH: 29.8 pg (ref 26.0–34.0)
MCHC: 32.9 g/dL (ref 30.0–36.0)
MCV: 90.7 fL (ref 80.0–100.0)
Platelets: 180 10*3/uL (ref 150–400)
RBC: 4.29 MIL/uL (ref 3.87–5.11)
RDW: 13.4 % (ref 11.5–15.5)
WBC: 8.5 10*3/uL (ref 4.0–10.5)
nRBC: 0 % (ref 0.0–0.2)

## 2021-01-14 LAB — BASIC METABOLIC PANEL
Anion gap: 11 (ref 5–15)
BUN: 8 mg/dL (ref 8–23)
CO2: 25 mmol/L (ref 22–32)
Calcium: 9.6 mg/dL (ref 8.9–10.3)
Chloride: 104 mmol/L (ref 98–111)
Creatinine, Ser: 0.54 mg/dL (ref 0.44–1.00)
GFR, Estimated: >60 mL/min (ref >60)
Glucose, Bld: 153 mg/dL — ABNORMAL HIGH (ref 70–99)
Potassium: 4 mmol/L (ref 3.5–5.1)
Sodium: 140 mmol/L (ref 135–145)

## 2021-01-14 LAB — RETICULOCYTES
Immature Retic Fract: 5.5 % (ref 2.3–15.9)
RBC.: 4.25 MIL/uL (ref 3.87–5.11)
Retic Count, Absolute: 45.1 10*3/uL (ref 19.0–186.0)
Retic Ct Pct: 1.1 % (ref 0.4–3.1)

## 2021-01-14 LAB — TYPE AND SCREEN
ABO/RH(D): A POS
Antibody Screen: NEGATIVE

## 2021-01-14 LAB — PROTIME-INR
INR: 1.3 — ABNORMAL HIGH (ref 0.8–1.2)
Prothrombin Time: 15.8 seconds — ABNORMAL HIGH (ref 11.4–15.2)

## 2021-01-14 LAB — HEMOGLOBIN A1C
Hgb A1c MFr Bld: 5.9 % — ABNORMAL HIGH (ref 4.8–5.6)
Mean Plasma Glucose: 122.63 mg/dL

## 2021-01-14 LAB — TROPONIN I (HIGH SENSITIVITY)
Troponin I (High Sensitivity): 16 ng/L (ref <18)
Troponin I (High Sensitivity): 19 ng/L — ABNORMAL HIGH (ref <18)

## 2021-01-14 LAB — FERRITIN: Ferritin: 165 ng/mL (ref 11–307)

## 2021-01-14 LAB — IRON AND TIBC
Iron: 53 ug/dL (ref 28–170)
Saturation Ratios: 19 % (ref 10.4–31.8)
TIBC: 280 ug/dL (ref 250–450)
UIBC: 227 ug/dL

## 2021-01-14 LAB — CORTISOL: Cortisol, Plasma: 73.2 ug/dL

## 2021-01-14 LAB — TSH: TSH: 2.243 u[IU]/mL (ref 0.350–4.500)

## 2021-01-14 LAB — GLUCOSE, CAPILLARY: Glucose-Capillary: 142 mg/dL — ABNORMAL HIGH (ref 70–99)

## 2021-01-14 LAB — MRSA NEXT GEN BY PCR, NASAL: MRSA by PCR Next Gen: NOT DETECTED

## 2021-01-14 LAB — VITAMIN B12: Vitamin B-12: 336 pg/mL (ref 180–914)

## 2021-01-14 LAB — FOLATE: Folate: 11.6 ng/mL (ref >5.9)

## 2021-01-14 LAB — APTT: aPTT: 38 seconds — ABNORMAL HIGH (ref 24–36)

## 2021-01-14 LAB — HEPARIN LEVEL (UNFRACTIONATED): Heparin Unfractionated: >1.1 IU/mL — ABNORMAL HIGH (ref 0.30–0.70)

## 2021-01-14 MED ORDER — ENSURE ENLIVE PO LIQD
237.0000 mL | Freq: Two times a day (BID) | ORAL | Status: DC
  Administered 2021-01-16 – 2021-01-19 (×5): 237 mL via ORAL

## 2021-01-14 MED ORDER — ACETAMINOPHEN 325 MG PO TABS
650.0000 mg | ORAL_TABLET | ORAL | Status: DC | PRN
  Administered 2021-01-15 – 2021-01-18 (×6): 650 mg via ORAL
  Filled 2021-01-14 (×7): qty 2

## 2021-01-14 MED ORDER — DOPAMINE-DEXTROSE 3.2-5 MG/ML-% IV SOLN
0.0000 ug/kg/min | INTRAVENOUS | Status: DC
  Administered 2021-01-14: 5 ug/kg/min via INTRAVENOUS

## 2021-01-14 MED ORDER — FLUTICASONE PROPIONATE 50 MCG/ACT NA SUSP
2.0000 | Freq: Every day | NASAL | Status: DC
  Administered 2021-01-15 – 2021-01-19 (×5): 2 via NASAL
  Filled 2021-01-14: qty 16

## 2021-01-14 MED ORDER — VITAMIN D 25 MCG (1000 UNIT) PO TABS
1000.0000 [IU] | ORAL_TABLET | Freq: Every day | ORAL | Status: DC
  Administered 2021-01-15 – 2021-01-19 (×6): 1000 [IU] via ORAL
  Filled 2021-01-14 (×6): qty 1

## 2021-01-14 MED ORDER — BUDESONIDE 0.5 MG/2ML IN SUSP
0.5000 mg | Freq: Two times a day (BID) | RESPIRATORY_TRACT | Status: DC
  Administered 2021-01-15 – 2021-01-19 (×9): 0.5 mg via RESPIRATORY_TRACT
  Filled 2021-01-14 (×10): qty 2

## 2021-01-14 MED ORDER — DIAZEPAM 2 MG PO TABS: 2.0000 mg | ORAL_TABLET | Freq: Four times a day (QID) | ORAL | Status: DC | PRN

## 2021-01-14 MED ORDER — POLYETHYLENE GLYCOL 3350 17 G PO PACK: 17.0000 g | PACK | Freq: Two times a day (BID) | ORAL | Status: DC | PRN

## 2021-01-14 MED ORDER — GUAIFENESIN 100 MG/5ML PO SOLN: 100.0000 mg | Freq: Two times a day (BID) | ORAL | Status: DC | PRN

## 2021-01-14 MED ORDER — ZOLPIDEM TARTRATE 5 MG PO TABS
5.0000 mg | ORAL_TABLET | Freq: Every evening | ORAL | Status: DC | PRN
  Administered 2021-01-15 – 2021-01-19 (×4): 5 mg via ORAL
  Filled 2021-01-14 (×4): qty 1

## 2021-01-14 MED ORDER — NITROGLYCERIN 0.4 MG SL SUBL: 0.4000 mg | SUBLINGUAL_TABLET | SUBLINGUAL | Status: DC | PRN

## 2021-01-14 MED ORDER — GUAIFENESIN ER 600 MG PO TB12
600.0000 mg | ORAL_TABLET | Freq: Two times a day (BID) | ORAL | Status: DC
  Administered 2021-01-15 – 2021-01-19 (×9): 600 mg via ORAL
  Filled 2021-01-14 (×10): qty 1

## 2021-01-14 MED ORDER — DOPAMINE-DEXTROSE 3.2-5 MG/ML-% IV SOLN
INTRAVENOUS | Status: AC
  Administered 2021-01-14: 5 ug/kg/min via INTRAVENOUS
  Filled 2021-01-14: qty 250

## 2021-01-14 MED ORDER — ALPRAZOLAM 0.25 MG PO TABS: 0.2500 mg | ORAL_TABLET | Freq: Two times a day (BID) | ORAL | Status: DC | PRN

## 2021-01-14 MED ORDER — FLUTICASONE PROPIONATE HFA 220 MCG/ACT IN AERO: 2.0000 | INHALATION_SPRAY | Freq: Two times a day (BID) | RESPIRATORY_TRACT | Status: DC

## 2021-01-14 MED ORDER — POLYETHYLENE GLYCOL 3350 17 GM/SCOOP PO POWD
17.0000 g | Freq: Two times a day (BID) | ORAL | Status: DC | PRN
  Filled 2021-01-14: qty 255

## 2021-01-14 MED ORDER — ONDANSETRON HCL 4 MG/2ML IJ SOLN
4.0000 mg | Freq: Four times a day (QID) | INTRAMUSCULAR | Status: DC | PRN
  Administered 2021-01-14: 4 mg via INTRAVENOUS
  Filled 2021-01-14: qty 2

## 2021-01-14 MED ORDER — DOCUSATE SODIUM 100 MG PO CAPS
100.0000 mg | ORAL_CAPSULE | Freq: Every day | ORAL | Status: DC
  Administered 2021-01-16 – 2021-01-19 (×4): 100 mg via ORAL
  Filled 2021-01-14 (×4): qty 1

## 2021-01-14 MED ORDER — LORATADINE 10 MG PO TABS
10.0000 mg | ORAL_TABLET | Freq: Every day | ORAL | Status: DC
  Administered 2021-01-15 – 2021-01-19 (×6): 10 mg via ORAL
  Filled 2021-01-14 (×6): qty 1

## 2021-01-14 MED ORDER — PANTOPRAZOLE SODIUM 40 MG PO TBEC
40.0000 mg | DELAYED_RELEASE_TABLET | Freq: Two times a day (BID) | ORAL | Status: DC
  Administered 2021-01-14 – 2021-01-19 (×10): 40 mg via ORAL
  Filled 2021-01-14 (×10): qty 1

## 2021-01-14 MED ORDER — ISOSORBIDE DINITRATE 5 MG PO TABS
15.0000 mg | ORAL_TABLET | Freq: Two times a day (BID) | ORAL | Status: DC
  Administered 2021-01-15: 15 mg via ORAL
  Filled 2021-01-14 (×2): qty 1

## 2021-01-14 MED ORDER — ALBUTEROL SULFATE (2.5 MG/3ML) 0.083% IN NEBU: 2.5000 mg | INHALATION_SOLUTION | Freq: Four times a day (QID) | RESPIRATORY_TRACT | Status: DC | PRN

## 2021-01-14 MED ORDER — ROSUVASTATIN CALCIUM 20 MG PO TABS
40.0000 mg | ORAL_TABLET | Freq: Every day | ORAL | Status: DC
  Administered 2021-01-15 – 2021-01-19 (×6): 40 mg via ORAL
  Filled 2021-01-14 (×6): qty 2

## 2021-01-14 MED ORDER — HEPARIN (PORCINE) 25000 UT/250ML-% IV SOLN
600.0000 [IU]/h | INTRAVENOUS | Status: DC
  Administered 2021-01-14: 600 [IU]/h via INTRAVENOUS
  Filled 2021-01-14: qty 250

## 2021-01-14 MED ORDER — ALBUTEROL SULFATE HFA 108 (90 BASE) MCG/ACT IN AERS
2.0000 | INHALATION_SPRAY | Freq: Four times a day (QID) | RESPIRATORY_TRACT | Status: DC | PRN
  Filled 2021-01-14: qty 6.7

## 2021-01-14 MED ORDER — LINACLOTIDE 72 MCG PO CAPS
72.0000 ug | ORAL_CAPSULE | Freq: Every day | ORAL | Status: DC
  Administered 2021-01-16 – 2021-01-18 (×3): 72 ug via ORAL
  Filled 2021-01-14 (×7): qty 1

## 2021-01-14 NOTE — ED Provider Notes (Signed)
[facility] EMERGENCY DEPARTMENT Provider Note   CSN: [id number] Arrival date & time: 01/14/21  1624     History Chief Complaint  Patient presents with   Loss of Consciousness    [name] is a 87 y.o. female.  Patient presents with chief complaint of syncopal episode.  Patient states that she was at the dinner table when she slumped forward and passed out.  Witnessed by the daughter who was present.  Patient currently back to baseline mental status but denies any headache or chest pain or abdominal pain.  No reports of fevers or cough or diarrhea.  She was seen yesterday with a chief complaint of vomiting.      Past Medical History:  Diagnosis Date   Anxiety    Aortic Stenosis s/p TAVR    Echo 10/21: EF 55-60, no RWMA, mild asymmetric LVH, normal RVSF, RVSP 40.2 mmHg, mild MR, s/p TAVR with mean gradient 8.8mHg, no PVL   Arthritis    OSTEO   Aspirin allergy    on Plavix   Asthma    Bronchitis    CAD (coronary artery disease)    a. s/p CABG 2006 with Cox-Maze procedure.   Carotid artery disease ([place]    a. s/p R CEA.   Chronic stable angina (HCC)    Diastolic dysfunction    Eczema    Fibromyalgia    GERD (gastroesophageal reflux disease)    Heart murmur    Hiatal hernia    Hyperlipidemia    Hypertension    Kyphoscoliosis    Mitral regurgitation    Myocardial infarction ([facility] 2003   Stent to CFX   [place]ysmal atrial fibrillation) ([place]    a. pt has h/o hematuria on Eliquis and has since refused anticoagulation   Pneumonia 2015 ?   Pulmonary regurgitation    Skin cancer of arm    Stroke ([facility]    Tricuspid regurgitation     Patient Active Problem List   Diagnosis Date Noted   GI bleed 04/22/2020   Presbycusis of both ears 11/13/2019   Vertigo 03/27/2017   Arthritis 03/27/2017   Stroke ([place]    Status post transcatheter aortic valve replacement (TAVR) using bioprosthesis 01/03/2017   Chronic apical periodontitis 12/28/2016    Retained dental roots 12/28/2016   Bilateral maxillary lateral exostoses [id number]  Diastolic dysfunction    Chronic stable angina (HCC)    Tricuspid regurgitation    Pulmonary regurgitation    Left shoulder pain    Back pain-similar to pt's angina 12/01/2015   Carotid artery disease- s/p RCE 12/01/2015   Bowen's disease 03/16/2015   Dependent edema 10/30/2014   History of allergy to aspirin 06/02/2014   PAF (paroxysmal atrial fibrillation) (HCC)    Gastroesophageal reflux disease without esophagitis 12/05/2011   Hx of CABG 2006 with Maze 07/21/2011   Mitral regurgitation 07/21/2011   Allergic rhinitis 10/05/2010   Hyperlipidemia [id number]  Uncomplicated asthma [id number]   Past Surgical History:  Procedure Laterality Date   ABDOMINAL HYSTERECTOMY  1976   CAROTID ENDARTERECTOMY  2010   CORONARY ANGIOPLASTY WITH STENT PLACEMENT     CORONARY ARTERY BYPASS GRAFT  01/2005   LIMA-D1; SVG-LAD; SVG-OM; SVG-PDA   EYE SURGERY     MULTIPLE EXTRACTIONS WITH ALVEOLOPLASTY  12/28/2016   Extraction of tooth #'s 2- 5,7-10, 12,13,17-20,and 22-29 with alveoloplasty and maxillary right and left lateral exostoses reductions   MULTIPLE EXTRACTIONS WITH ALVEOLOPLASTY N/A 12/28/2016   Procedure: Extraction of  tooth #'s 2- 5,7-10, 12,13,17-20,and 22-29 with alveoloplasty and maxillary right and left lateral exostoses reductions;  Surgeon: Lenn Cal, DDS;  Location: [place];  Service: Oral Surgery;  Laterality: N/A;   RIGHT/LEFT HEART CATH AND CORONARY/GRAFT ANGIOGRAPHY N/A 10/27/2016   Procedure: Right/Left Heart Cath and Coronary/Graft Angiography;  Surgeon: Burnell Blanks, MD;  Location: [place] CV LAB;  Service: Cardiovascular;  Laterality: N/A;   TEE WITHOUT CARDIOVERSION N/A 01/03/2017   Procedure: TRANSESOPHAGEAL ECHOCARDIOGRAM (TEE);  Surgeon: Burnell Blanks, MD;  Location: [place];  Service: Open Heart Surgery;  Laterality: N/A;   TONSILLECTOMY     TRANSCATHETER  AORTIC VALVE REPLACEMENT, TRANSFEMORAL N/A 01/03/2017   Procedure: TRANSCATHETER AORTIC VALVE REPLACEMENT, TRANSFEMORAL;  Surgeon: Burnell Blanks, MD;  Location: [place];  Service: Open Heart Surgery;  Laterality: N/A;   TUMOR REMOVAL       OB History   No obstetric history on file.     Family History  Problem Relation Age of Onset   Heart failure Mother    Other Father     Social History   Tobacco Use   Smoking status: Never   Smokeless tobacco: Former    Types: Snuff    Quit date: 05/23/2001  Vaping Use   Vaping Use: Never used  Substance Use Topics   Alcohol use: No   Drug use: No    Home Medications Prior to Admission medications   Medication Sig Start Date End Date Taking? Authorizing Provider  acetaminophen (TYLENOL) 650 MG CR tablet Take 1,300 mg by mouth 2 (two) times daily as needed for pain.     [provider]  Cholecalciferol (VITAMIN D-3) 25 MCG (1000 UT) CAPS Take 1,000 Units by mouth daily.    [provider]  diazepam (VALIUM) 2 MG tablet Take 1 tablet (2 mg total) by mouth every 6 (six) hours as needed for anxiety. 11/02/18   Denita Lung, MD  docusate sodium (COLACE) 100 MG capsule Take 1 capsule (100 mg total) by mouth daily. 04/27/20   Mercy Riding, MD  ELIQUIS 2.5 MG TABS tablet TAKE 1 TABLET(2.5 MG) BY MOUTH TWICE DAILY 06/24/20   Burnell Blanks, MD  Ensure Plus (ENSURE PLUS) LIQD Take 237 mLs by mouth 2 (two) times daily between meals.    [provider]  FLOVENT HFA 220 MCG/ACT inhaler INHALE 2 PUFFS INTO THE LUNGS TWICE DAILY 11/18/19   Denita Lung, MD  fluticasone [facility]) 50 MCG/ACT nasal spray SHAKE LIQUID AND USE 2 SPRAYS IN EACH NOSTRIL EVERY DAY 09/01/20   Denita Lung, MD  guaiFENesin (MUCINEX) 600 MG 12 hr tablet Take 1 tablet (600 mg total) by mouth 2 (two) times daily. 05/25/14   Regalado, Belkys A, MD  guaiFENesin (ROBITUSSIN) 100 MG/5ML liquid Take 100 mg by mouth 2 (two) times daily as needed  for cough or congestion. Reported on 12/08/2015    [provider]  isosorbide dinitrate (ISORDIL) 30 MG tablet Take 0.5 tablets (15 mg total) by mouth 2 (two) times daily. 07/10/20   Richardson Dopp T, PA-C  LINZESS 72 MCG capsule Take 72 mcg by mouth daily. 11/24/20   [provider]  loratadine (CLARITIN) 10 MG tablet Take 10 mg by mouth daily.      [provider]  metoprolol tartrate (LOPRESSOR) 25 MG tablet TAKE 1/2 TABLET BY MOUTH TWICE DAILY 09/15/20   Denita Lung, MD  nitroGLYCERIN (NITROSTAT) 0.4 MG SL tablet DISSOLVE 1 TABLET UNDER  TONGUE EVERY 5 MINUTES AS NEEDED FOR CHEST PAIN 01/04/21   Burnell Blanks, MD  pantoprazole (PROTONIX) 40 MG tablet TAKE 1 TABLET BY MOUTH TWICE DAILY 12/04/20   Denita Lung, MD  polyethylene glycol powder (MIRALAX) 17 GM/SCOOP powder Take 17 g by mouth 2 (two) times daily as needed for moderate constipation. Patient not taking: Reported on 01/06/2021 04/27/20   Mercy Riding, MD  rosuvastatin (CRESTOR) 40 MG tablet TAKE 1 TABLET BY MOUTH EVERY DAY 12/28/20   Denita Lung, MD  triamcinolone (KENALOG) 0.1 % Apply 1 application topically 2 (two) times daily. Patient not taking: No sig reported 05/21/20   Denita Lung, MD  VENTOLIN HFA 108 [phone] Base) MCG/ACT inhaler INHALE 2 PUFFS BY MOUTH EVERY 6 HOURS AS NEEDED FOR WHEEZING OR SHORTNESS OF BREATH 02/12/20   Denita Lung, MD    Allergies    Other, Bactrim [sulfamethoxazole-trimethoprim], Amitriptyline hcl, Aspirin, Tape, and Zetia [ezetimibe]  Review of Systems   Review of Systems  Constitutional:  Negative for fever.  HENT:  Negative for ear pain.   Eyes:  Negative for pain.  Respiratory:  Negative for cough.   Cardiovascular:  Negative for chest pain.  Gastrointestinal:  Negative for abdominal pain.  Genitourinary:  Negative for flank pain.  Musculoskeletal:  Negative for back pain.  Skin:  Negative for rash.  Neurological:  Negative for headaches.   Physical  Exam Updated Vital Signs BP (!) 166/81   Pulse 61   Temp 98.4 F (36.9 C) (Oral)   Resp 19   Ht '5\' 2"'$  (1.575 m)   Wt 40.8 kg   SpO2 94%   BMI 16.45 kg/m   Physical Exam Constitutional:      General: She is not in acute distress.    Appearance: Normal appearance.  HENT:     Head: Normocephalic.     Nose: Nose normal.  Eyes:     Extraocular Movements: Extraocular movements intact.  Cardiovascular:     Rate and Rhythm: Rhythm irregular.     Comments: Bradycardic with 3 to 4-second pauses. Pulmonary:     Effort: Pulmonary effort is normal.  Musculoskeletal:        General: Normal range of motion.     Cervical back: Normal range of motion.  Neurological:     General: No focal deficit present.     Mental Status: She is alert. Mental status is at baseline.    ED Results / Procedures / Treatments   Labs (all labs ordered are listed, but only abnormal results are displayed) Labs Reviewed  CBC WITH DIFFERENTIAL/PLATELET - Abnormal; Notable for the following components:      Result Value   Hemoglobin 11.7 (*)    Lymphs Abs 0.5 (*)    All other components within normal limits  BASIC METABOLIC PANEL - Abnormal; Notable for the following components:   Glucose, Bld 153 (*)    All other components within normal limits  RESP PANEL BY RT-PCR (FLU A&B, COVID) [id number]  SARS CORONAVIRUS 2 (TAT 6-24 HRS)  TROPONIN I (HIGH SENSITIVITY)  TROPONIN I (HIGH SENSITIVITY)    EKG None  Radiology DG Chest Port 1 View  Result Date: 01/14/2021 CLINICAL DATA:  Atrial fibrillation and syncope EXAM: PORTABLE CHEST 1 VIEW COMPARISON:  01/13/2021 FINDINGS: Numerous leads and wires project over the chest. Prior median sternotomy. CABG. Midline trachea. Normal heart size. Atherosclerosis in the transverse aorta. Possible trace right pleural fluid. Right costophrenic angle partially excluded. No  pneumothorax. No congestive failure. Right lung base calcified granuloma. No lobar consolidation.  IMPRESSION: No acute cardiopulmonary disease. Aortic Atherosclerosis (ICD10-I70.0). Possible trace right pleural fluid. Electronically Signed   By: Abigail Miyamoto M.D.   On: 01/14/2021 17:42   DG Chest Port 1 View  Result Date: 01/13/2021 CLINICAL DATA:  Headache and nausea this morning. Anxious and short of breath throughout the day. EXAM: PORTABLE CHEST 1 VIEW COMPARISON:  04/23/2020 FINDINGS: Postoperative changes in the mediastinum. Normal heart size and pulmonary vascularity. Cardiac valve prosthesis. Emphysematous changes in the lungs. Fibrosis in the upper lungs. Peribronchial thickening suggesting bronchitic change. Calcified granuloma in the right base. Mild blunting of the right costophrenic angle is unchanged since prior study, likely pleural thickening. Calcification of the aorta. Thoracolumbar scoliosis with degenerative change. IMPRESSION: Emphysematous and chronic bronchitic changes in the lungs. Fibrosis in the apices. Right pleural thickening. No active pulmonary disease. Electronically Signed   By: Lucienne Capers M.D.   On: 01/13/2021 21:55    Procedures .Critical Care  Date/Time: 01/14/2021 6:58 PM Performed by: Luna Fuse, MD Authorized by: Luna Fuse, MD   Critical care provider statement:    Critical care time (minutes):  40   Critical care time was exclusive of:  Separately billable procedures and treating other patients and teaching time   Critical care was necessary to treat or prevent imminent or life-threatening deterioration of the following conditions:  Cardiac failure Comments:     Sinus pauses, with syncope    Medications Ordered in ED Medications  DOPamine (INTROPIN) 800 mg in dextrose 5 % 250 mL (3.2 mg/mL) infusion (has no administration in time range)  DOPamine (INTROPIN) 3.2-5 MG/ML-% infusion (has no administration in time range)    ED Course  I have reviewed the triage vital signs and the nursing notes.  Pertinent labs & imaging results that  were available during my care of the patient were reviewed by me and considered in my medical decision making (see chart for details).    MDM Rules/Calculators/A&P                           Patient had 3-4 recurrent syncopal episodes well sitting in the gurney here in the ER.  During these episodes she had sinus pauses lasting anywhere from 3 to 4 seconds and then returned back to atrial fibrillation with rate about 50 to 60 bpm.  I placed her on transcutaneous pacing with a rate of 60 bpm and 65 mg strength.  Blood pressure remained stable at this time.  Cardiology consultation requested.  Patient to be admitted to cardiology service, requesting medical consultation which has been requested as well.   Final Clinical Impression(s) / ED Diagnoses Final diagnoses:  Syncope, unspecified syncope type  Sinus pause    Rx / DC Orders ED Discharge Orders     None        Luna Fuse, MD 01/14/21 1900

## 2021-01-14 NOTE — ED Notes (Signed)
Daughter alerted staff that pt had another syncopal episode while laying on bed. Pt passed out for a few seconds then woke up. Dr.Hong at bedside. MD increased pacing mode to 70 Christin [place] with 58 bpm set rate. Pt continues to complain of chest pounding. MD aware. Pt denies nausea at this time. Continues to be alert and oriented x 4.

## 2021-01-14 NOTE — Progress Notes (Signed)
ANTICOAGULATION CONSULT NOTE - Initial Consult  Pharmacy Consult for Heparin Indication: atrial fibrillation  Allergies  Allergen Reactions   Other Other (See Comments)    NO ACIDIC, TART< OR SPICY FOODS- DEVELOPS REFLUX OFTEN!!  PATIENT HAS TROUBLE SWALLOWING TABLETS!!   Bactrim [Sulfamethoxazole-Trimethoprim] Other (See Comments)    "makes her feel funny" or "unsteady"    Amitriptyline Hcl Rash   Aspirin Hives, Swelling, Rash and Other (See Comments)    Body became swollen   Tape Other (See Comments)    SKIN IS VERY THIN- WILL TEAR EASILY!!   Zetia [Ezetimibe] Rash    Patient Measurements: Height: '5\' 2"'$  (157.5 cm) Weight: 40.8 kg (89 lb 15.2 oz) IBW/kg (Calculated) : 50.1 Heparin Dosing Weight: 40.8 kg  Vital Signs: Temp: 98.4 F (36.9 C) (08/25 1641) Temp Source: Oral (08/25 1641) BP: 183/84 (08/25 1915) Pulse Rate: 81 (08/25 1915)  Labs: Recent Labs    01/13/21 2142 01/14/21 1643  HGB 12.1 11.7*  HCT 36.3 37.4  PLT 186 187  CREATININE 0.53 0.54  TROPONINIHS  --  16    Estimated Creatinine Clearance: 30.7 mL/min (by C-G formula based on SCr of 0.54 mg/dL).   Medical History: Past Medical History:  Diagnosis Date   Anxiety    Aortic Stenosis s/p TAVR    Echo 10/21: EF 55-60, no RWMA, mild asymmetric LVH, normal RVSF, RVSP 40.2 mmHg, mild MR, s/p TAVR with mean gradient 8.19mHg, no PVL   Arthritis    OSTEO   Aspirin allergy    on Plavix   Asthma    Bronchitis    CAD (coronary artery disease)    a. s/p CABG 2006 with Cox-Maze procedure.   [place] ([place]    a. s/p R CEA.   Chronic stable angina (HCC)    Diastolic dysfunction    Eczema    Fibromyalgia    GERD (gastroesophageal reflux disease)    Heart murmur    Hiatal hernia    Hyperlipidemia    Hypertension    Kyphoscoliosis    Mitral regurgitation    Myocardial infarction ([facility] 2003   Stent to CFX   PAF (paroxysmal atrial fibrillation) ([place]    a. pt has h/o hematuria on  Eliquis and has since refused anticoagulation   Pneumonia 2015 ?   Pulmonary regurgitation    Skin cancer of arm    Stroke ([facility]    Tricuspid regurgitation     Medications:  (Not in a hospital admission)  Scheduled:   cholecalciferol  1,000 Units Oral Daily   docusate sodium  100 mg Oral Daily   [START ON 01/15/2021] Ensure Plus  237 mL Oral BID BM   fluticasone  2 spray Each Nare Daily   fluticasone  2 puff Inhalation BID   guaiFENesin  600 mg Oral BID   isosorbide dinitrate  15 mg Oral BID   linaclotide  72 mcg Oral Daily   loratadine  10 mg Oral Daily   pantoprazole  40 mg Oral BID   rosuvastatin  40 mg Oral Daily   Infusions:   DOPamine 5 mcg/kg/min (01/14/21 1938)    Assessment: 89yof with a history of diverticular GIB 04/2020, CABG w/ MAZE 2006, 3/4 grafts patent cath 2018, Afib on Eliquis, CVA, R-CEA, TAVR 2018, HTN, HLD, 2nd deg AVB, LBBB. Patient presenting with syncope and bradycardia   Patient taking eliquis prior to arrival. Last taken 8/25 at 0130. Will require aPTT monitoring until anti-xa levels correlated.  Hgb 11.7;  plt 187  Goal of Therapy:  Heparin level 0.3-0.7 units/ml aPTT 66-102 seconds Monitor platelets by anticoagulation protocol: Yes   Plan:  No initial bolus Start heparin infusion at 600 units/hr Check aPTT and anti-Xa level in 8 hours and daily while on heparin Continue to monitor H&H and platelets Will require aPTT monitoring until anti-xa levels correlated.  Lorelei Pont, PharmD, BCPS 01/14/2021 7:47 PM ED Clinical Pharmacist -  [phone]

## 2021-01-14 NOTE — ED Notes (Signed)
Pt had a witnessed syncopal episode by staff while staff was hooking pt to the monitor. Pt passed out for a few seconds then woke up. Dr.Hong at bedside when pt HR went to 0. Observed with some cardiac pause on the monitor then HR was bradycardic at 35bpm. Pt started vomiting and complaining of chest pounding. Pt remains alert and oriented x 4. Pt placed on Zoll monitor with pads on. Dr.Hong set Zoll on pacing mode when at a rate of 58 bpm. Cardiac monitoring in place. Will continue to monitor.

## 2021-01-14 NOTE — ED Triage Notes (Signed)
Brought in by EMS from home due to a witnessed syncopal episode for less than 10 seconds by daughter. Pt was sitting at dining table when she passed out face hit the table. No obvious injuries. Pt is now alert and oriented x 4. Reports dizziness, weakness, nausea going on for a few months now. Pt had another syncopal episode in ED witnessed by staff.

## 2021-01-14 NOTE — Consult Note (Signed)
[facility] Consultation  [name] [id number] DOB: 1929/02/06 DOA: 01/14/2021 PCP: Denita Lung, MD   Requesting physician: Dr. Ezra Sites. Date of consultation: January 14, 2021. Reason for consultation: Failure to thrive.  Impression/Recommendations Active Problems:   Hx of CABG 2006 with Maze   Atrial fibrillation with slow ventricular response (HCC)   Sinus pause   Syncope   Anemia   Weakness    Failure to thrive poor appetite early satiety weight loss -Per patient's daughter who provided the history patient has been having poor appetite particularly last 3 to 4 days with some nausea.  Patient does have history of right inguinal hernia but has not had any vomiting though patient did have nausea.  Has moved her bowels in the ER.  Also been feeling weak and tired fatigue.  We will check a sonogram of the abdomen when patient is off the vasopressors to make sure there is no gallbladder pathology.  We will also check cortisol level TSH and anemia panel. Anemia with history of GI bleed in December 2021 suspected to be likely from diverticular.  Follow CBC. A. fib with slow ventricular response and pauses for which patient is on dopamine and pacemaker pads cardiology following. History of CAD status post CABG.  Denies any chest pain.  I will followup again tomorrow. Please contact me if I can be of assistance in the meanwhile. Thank you for this consultation.  Chief Complaint: Loss of consciousness.  HPI:  87 year old female with history of CAD status post CABG, status post TAVR, A. fib presents to the ER for the second time in last 24 hours.  Had come to the ER last night for shortness of breath work-up was unremarkable was discharged home.  This morning patient had 2 syncopal episode once while in the bed and second 1 while on the table eating breakfast.  Lasted for few seconds and regained consciousness.  Did not complain of any chest pain.  Was brought to  the ER.  Was found to have A. fib with slow responses and 6-second pauses.  Cardiology was notified.  Hospitalist was consulted for patient having poor appetite early satiety failure to thrive.  Labs show hemoglobin 11.7 high sensitive troponin was 16 and 19.  Metabolic panel largely unremarkable.  Review of Systems:  As in the history of presenting illness nothing else significant.  Past Medical History:  Diagnosis Date   Anxiety    Aortic Stenosis s/p TAVR    Echo 10/21: EF 55-60, no RWMA, mild asymmetric LVH, normal RVSF, RVSP 40.2 mmHg, mild MR, s/p TAVR with mean gradient 8.65mHg, no PVL   Arthritis    OSTEO   Aspirin allergy    on Plavix   Asthma    Bronchitis    CAD (coronary artery disease)    a. s/p CABG 2006 with Cox-Maze procedure.   Carotid artery disease ([place]    a. s/p R CEA.   Chronic stable angina (HCC)    Diastolic dysfunction    Eczema    Fibromyalgia    GERD (gastroesophageal reflux disease)    Heart murmur    Hiatal hernia    Hyperlipidemia    Hypertension    Kyphoscoliosis    Mitral regurgitation    [facility]   Stent to CFX   [place]    [place]. pt has h/o hematuria on Eliquis and has since refused anticoagulation   Pneumonia 2015 ?   Pulmonary regurgitation  Skin cancer of arm    Stroke [facility])    Tricuspid regurgitation    Past Surgical History:  Procedure Laterality Date   ABDOMINAL HYSTERECTOMY  1976   CAROTID ENDARTERECTOMY  2010   CORONARY ANGIOPLASTY WITH STENT PLACEMENT     CORONARY ARTERY BYPASS GRAFT  01/2005   LIMA-D1; SVG-LAD; SVG-OM; SVG-PDA   EYE SURGERY     MULTIPLE EXTRACTIONS WITH ALVEOLOPLASTY  12/28/2016   Extraction of tooth #'s 2- 5,7-10, [id number] 22-29 with alveoloplasty and maxillary right and left lateral exostoses reductions   MULTIPLE EXTRACTIONS WITH ALVEOLOPLASTY N/A 12/28/2016   Procedure: Extraction of tooth #'s 2- 5,7-10, 12,13,17-20,and 22-29 with  alveoloplasty and maxillary right and left lateral exostoses reductions;  Surgeon: Lenn Cal, DDS;  Location: [facility];  Service: Oral Surgery;  Laterality: N/A;   RIGHT/LEFT HEART CATH AND CORONARY/GRAFT ANGIOGRAPHY N/A 10/27/2016   Procedure: Right/Left Heart Cath and Coronary/Graft Angiography;  Surgeon: Burnell Blanks, MD;  Location: [facility];  Service: Cardiovascular;  Laterality: N/A;   TEE WITHOUT CARDIOVERSION N/A 01/03/2017   Procedure: TRANSESOPHAGEAL ECHOCARDIOGRAM (TEE);  Surgeon: Burnell Blanks, MD;  Location: [facility];  Service: Open Heart Surgery;  Laterality: N/A;   TONSILLECTOMY     TRANSCATHETER AORTIC VALVE REPLACEMENT, TRANSFEMORAL N/A 01/03/2017   Procedure: TRANSCATHETER AORTIC VALVE REPLACEMENT, TRANSFEMORAL;  Surgeon: Burnell Blanks, MD;  Location: [facility];  Service: Open Heart Surgery;  Laterality: N/A;   TUMOR REMOVAL     Social History:  reports that she has never smoked. She quit smokeless tobacco use about 19 years ago.  Her smokeless tobacco use included snuff. She reports that she does not drink alcohol and does not use drugs.  Allergies  Allergen Reactions   Other Other (See Comments)    NO ACIDIC, TART< OR SPICY FOODS- DEVELOPS REFLUX OFTEN!!  PATIENT HAS TROUBLE SWALLOWING TABLETS!!   Bactrim [Sulfamethoxazole-Trimethoprim] Other (See Comments)    "makes her feel funny" or "unsteady"    Amitriptyline Hcl Rash   Aspirin Hives, Swelling, Rash and Other (See Comments)    Body became swollen   Tape Other (See Comments)    SKIN IS VERY THIN- WILL TEAR EASILY!!   Zetia [Ezetimibe] Rash   Family History  Problem Relation Age of Onset   Heart failure Mother    Other Father     Prior to Admission medications   Medication Sig Start Date End Date Taking? Authorizing Provider  acetaminophen (TYLENOL) 650 MG CR tablet Take 1,300 mg by mouth 2 (two) times daily as needed for pain.     [provider]  Cholecalciferol  (VITAMIN D-3) 25 MCG (1000 UT) CAPS Take 1,000 Units by mouth daily.    [provider]  diazepam (VALIUM) 2 MG tablet Take 1 tablet (2 mg total) by mouth every 6 (six) hours as needed for anxiety. 11/02/18   Denita Lung, MD  docusate sodium (COLACE) 100 MG capsule Take 1 capsule (100 mg total) by mouth daily. 04/27/20   Mercy Riding, MD  ELIQUIS 2.5 MG TABS tablet TAKE 1 TABLET(2.5 MG) BY MOUTH TWICE DAILY 06/24/20   Burnell Blanks, MD  Ensure Plus (ENSURE PLUS) LIQD Take 237 mLs by mouth 2 (two) times daily between meals.    [provider]  FLOVENT HFA 220 MCG/ACT inhaler INHALE 2 PUFFS INTO THE LUNGS TWICE DAILY 11/18/19   Denita Lung, MD  fluticasone (FLONASE) 50 MCG/ACT nasal spray SHAKE LIQUID AND USE 2 SPRAYS  IN [facility] NOSTRIL EVERY DAY 09/01/20   Denita Lung, MD  guaiFENesin (MUCINEX) 600 MG 12 hr tablet Take 1 tablet (600 mg total) by mouth 2 (two) times daily. 05/25/14   Regalado, Belkys A, MD  guaiFENesin (ROBITUSSIN) 100 MG/5ML liquid Take 100 mg by mouth 2 (two) times daily as needed for cough or congestion. Reported on 12/08/2015    [provider]  isosorbide dinitrate (ISORDIL) 30 MG tablet Take 0.5 tablets (15 mg total) by mouth 2 (two) times daily. 07/10/20   Richardson Dopp T, PA-C  LINZESS 72 MCG capsule Take 72 mcg by mouth daily. 11/24/20   [provider]  loratadine (CLARITIN) 10 MG tablet Take 10 mg by mouth daily.      [provider]  metoprolol tartrate (LOPRESSOR) 25 MG tablet TAKE 1/2 TABLET BY MOUTH TWICE DAILY 09/15/20   Denita Lung, MD  nitroGLYCERIN (NITROSTAT) 0.4 MG SL tablet DISSOLVE 1 TABLET UNDER TONGUE EVERY 5 MINUTES AS NEEDED FOR CHEST PAIN 01/04/21   Burnell Blanks, MD  pantoprazole (PROTONIX) 40 MG tablet TAKE 1 TABLET BY MOUTH TWICE DAILY 12/04/20   Denita Lung, MD  polyethylene glycol powder (MIRALAX) 17 GM/SCOOP powder Take 17 g by mouth 2 (two) times daily as needed for moderate  constipation. Patient not taking: Reported on 01/06/2021 04/27/20   Mercy Riding, MD  rosuvastatin (CRESTOR) 40 MG tablet TAKE 1 TABLET BY MOUTH EVERY DAY 12/28/20   Denita Lung, MD  triamcinolone (KENALOG) 0.1 % Apply 1 application topically 2 (two) times daily. Patient not taking: No sig reported 05/21/20   Denita Lung, MD  VENTOLIN HFA 108 [phone] Base) MCG/ACT inhaler INHALE 2 PUFFS BY MOUTH EVERY 6 HOURS AS NEEDED FOR WHEEZING OR SHORTNESS OF BREATH 02/12/20   Denita Lung, MD   Physical Exam: Blood pressure (!) 183/84, pulse 81, temperature 98.4 F (36.9 C), temperature source Oral, resp. rate 20, height '5\' 2"'$  (1.575 m), weight 40.8 kg, SpO2 97 %. Vitals:   01/14/21 1900 01/14/21 1915  BP: (!) 173/83 (!) 183/84  Pulse: 70 81  Resp: 13 20  Temp:    SpO2: 100% 97%    General: Appears cachectic. Eyes: Anicteric no pallor. ENT: No discharge from the ears eyes nose and mouth. Neck: No mass felt.  No neck rigidity. Cardiovascular: No rhonchi or crepitations. Respiratory: S1-S2 heard. Abdomen: Soft nontender bowel sound present. Skin: No rash. Musculoskeletal: No edema. Psychiatric: Appears normal. Neurologic: Alert awake oriented to place and person moving all extremities.  Labs on Admission:  Basic Metabolic Panel: Recent Labs  Lab 01/13/21 2142 01/14/21 1643  NA 136 140  K 3.6 4.0  CL 99 104  CO2 28 25  GLUCOSE 133* 153*  BUN 9 8  CREATININE 0.53 0.54  CALCIUM 9.5 9.6   Liver Function Tests: No results for input(s): AST, ALT, ALKPHOS, BILITOT, PROT, ALBUMIN in the last 168 hours. No results for input(s): LIPASE, AMYLASE in the last 168 hours. No results for input(s): AMMONIA in the last 168 hours. CBC: Recent Labs  Lab 01/13/21 2142 01/14/21 1643  WBC 4.5 6.4  NEUTROABS  --  5.4  HGB 12.1 11.7*  HCT 36.3 37.4  MCV 91.9 93.3  PLT 186 187   Cardiac Enzymes: No results for input(s): CKTOTAL, CKMB, CKMBINDEX, TROPONINI in the last 168  hours. BNP: Invalid input(s): POCBNP CBG: No results for input(s): GLUCAP in the last 168 hours.  Radiological Exams on Admission: DG Chest  Port 1 View  Result Date: 01/14/2021 CLINICAL DATA:  Atrial fibrillation and syncope EXAM: PORTABLE CHEST 1 VIEW COMPARISON:  01/13/2021 FINDINGS: Numerous leads and wires project over the chest. Prior median sternotomy. CABG. Midline trachea. Normal heart size. Atherosclerosis in the transverse aorta. Possible trace right pleural fluid. Right costophrenic angle partially excluded. No pneumothorax. No congestive failure. Right lung base calcified granuloma. No lobar consolidation. IMPRESSION: No acute cardiopulmonary disease. Aortic Atherosclerosis (ICD10-I70.0). Possible trace right pleural fluid. Electronically Signed   By: Abigail Miyamoto M.D.   On: 01/14/2021 17:42   DG Chest Port 1 View  Result Date: 01/13/2021 CLINICAL DATA:  Headache and nausea this morning. Anxious and short of breath throughout the day. EXAM: PORTABLE CHEST 1 VIEW COMPARISON:  04/23/2020 FINDINGS: Postoperative changes in the mediastinum. Normal heart size and pulmonary vascularity. Cardiac valve prosthesis. Emphysematous changes in the lungs. Fibrosis in the upper lungs. Peribronchial thickening suggesting bronchitic change. Calcified granuloma in the right base. Mild blunting of the right costophrenic angle is unchanged since prior study, likely pleural thickening. Calcification of the aorta. Thoracolumbar scoliosis with degenerative change. IMPRESSION: Emphysematous and chronic bronchitic changes in the lungs. Fibrosis in the apices. Right pleural thickening. No active pulmonary disease. Electronically Signed   By: Lucienne Capers M.D.   On: 01/13/2021 21:55    EKG: Independently reviewed.  A. fib with pauses.  Time spent: 50 minutes.  Rise Patience [facility]  If 7PM-7AM, please contact night-coverage [email or web address] Password [facility] 01/14/2021, 8:29 PM

## 2021-01-14 NOTE — H&P (Addendum)
Cardiology History and Physical:   Patient ID: TAVITA ORIANS MRN: [id number]; DOB: [birth year]  Admit date: 01/14/2021 Date of Consult: 01/14/2021  PCP:  Denita Lung, MD   [facility] Providers Cardiologist:  Lauree Chandler, MD     Patient Profile:   [name] is a 87 y.o. female with a hx of diverticular GIB 04/2020, CABG w/ MAZE 2006, 3/4 grafts patent cath 2018, Afib on Eliquis, CVA, R-CEA, TAVR 2018, HTN, HLD, 2nd deg AVB, LBBB, who is being seen 01/14/2021 for the evaluation of syncope and bradycardia at the request of Dr Almyra Free.  History of Present Illness:   [name] lives w/ her daughter. She has been eating poorly, c/o early satiety. She has been losing weight for a while, she weighed 111 lbs 01/2019, but has been getting smaller, with her weight today 89 lbs.  She came to the ER last pm because of SOB, no clear cause found.  She was in atrial fib, controlled VR. They thought it might be asthma.   Today, she was going to eat a little and lost consciousness while sitting at the kitchen table. She fel forward and has a small wound from her glasses, no other injuries. Her daughter brought her back to the ER.   She was in atrial fib, with pauses > 6 sec at times, symptomatic. She was put on external pacing pads which are functioning at times. She feels the pacer pacing, but is not acutely uncomfortable.   She has been worried about her stomach, because she just cannot eat very much.   Her stamina is poor, she is not able to get up and around much at home.   Her daughter provides excellent care.   Discussed CODE status w/ pt and dtr, pt wishes to be a full code.   Past Medical History:  Diagnosis Date   Anxiety    Aortic Stenosis s/p TAVR    Echo 10/21: EF 55-60, no RWMA, mild asymmetric LVH, normal RVSF, RVSP 40.2 mmHg, mild MR, s/p TAVR with mean gradient 8.64mHg, no PVL   Arthritis    OSTEO   Aspirin allergy    on Plavix   Asthma    Bronchitis     CAD (coronary artery disease)    a. s/p CABG 2006 with Cox-Maze procedure.   [place]    [place]. s/p R CEA.   Chronic stable angina (HCC)    Diastolic dysfunction    Eczema    Fibromyalgia    GERD (gastroesophageal reflux disease)    Heart murmur    Hiatal hernia    Hyperlipidemia    Hypertension    Kyphoscoliosis    Mitral regurgitation    [facility]   Stent to CFX   [place]    a. pt has h/o hematuria on Eliquis and has since refused anticoagulation   Pneumonia 2015 ?   Pulmonary regurgitation    Skin cancer of arm    Stroke ([facility]    Tricuspid regurgitation     Past Surgical History:  Procedure Laterality Date   ABDOMINAL HYSTERECTOMY  1976   CAROTID ENDARTERECTOMY  2010   CORONARY ANGIOPLASTY WITH STENT PLACEMENT     CORONARY ARTERY BYPASS GRAFT  01/2005   LIMA-D1; SVG-LAD; SVG-OM; SVG-PDA   EYE SURGERY     MULTIPLE EXTRACTIONS WITH ALVEOLOPLASTY  12/28/2016   Extraction of tooth #'s 2- 5,7-10, [id number] with alveoloplasty and maxillary right and  left lateral exostoses reductions   MULTIPLE EXTRACTIONS WITH ALVEOLOPLASTY N/A 12/28/2016   Procedure: Extraction of tooth #'s 2- 5,7-10, 12,13,17-20,and 22-29 with alveoloplasty and maxillary right and left lateral exostoses reductions;  Surgeon: Lenn Cal, DDS;  Location: [place];  Service: Oral Surgery;  Laterality: N/A;   RIGHT/LEFT HEART CATH AND CORONARY/GRAFT ANGIOGRAPHY N/A 10/27/2016   Procedure: Right/Left Heart Cath and Coronary/Graft Angiography;  Surgeon: Burnell Blanks, MD;  Location: [place] CV LAB;  Service: Cardiovascular;  Laterality: N/A;   TEE WITHOUT CARDIOVERSION N/A 01/03/2017   Procedure: TRANSESOPHAGEAL ECHOCARDIOGRAM (TEE);  Surgeon: Burnell Blanks, MD;  Location: [place];  Service: Open Heart Surgery;  Laterality: N/A;   TONSILLECTOMY     TRANSCATHETER AORTIC VALVE REPLACEMENT, TRANSFEMORAL N/A  01/03/2017   Procedure: TRANSCATHETER AORTIC VALVE REPLACEMENT, TRANSFEMORAL;  Surgeon: Burnell Blanks, MD;  Location: [place];  Service: Open Heart Surgery;  Laterality: N/A;   TUMOR REMOVAL       Home Medications:  Prior to Admission medications   Medication Sig Start Date End Date Taking? Authorizing Provider  acetaminophen (TYLENOL) 650 MG CR tablet Take 1,300 mg by mouth 2 (two) times daily as needed for pain.     [provider]  Cholecalciferol (VITAMIN D-3) 25 MCG (1000 UT) CAPS Take 1,000 Units by mouth daily.    [provider]  diazepam (VALIUM) 2 MG tablet Take 1 tablet (2 mg total) by mouth every 6 (six) hours as needed for anxiety. 11/02/18   Denita Lung, MD  docusate sodium (COLACE) 100 MG capsule Take 1 capsule (100 mg total) by mouth daily. 04/27/20   Mercy Riding, MD  ELIQUIS 2.5 MG TABS tablet TAKE 1 TABLET(2.5 MG) BY MOUTH TWICE DAILY 06/24/20   Burnell Blanks, MD  Ensure Plus (ENSURE PLUS) LIQD Take 237 mLs by mouth 2 (two) times daily between meals.    [provider]  FLOVENT HFA 220 MCG/ACT inhaler INHALE 2 PUFFS INTO THE LUNGS TWICE DAILY 11/18/19   Denita Lung, MD  fluticasone [facility]) 50 MCG/ACT nasal spray SHAKE LIQUID AND USE 2 SPRAYS IN EACH NOSTRIL EVERY DAY 09/01/20   Denita Lung, MD  guaiFENesin (MUCINEX) 600 MG 12 hr tablet Take 1 tablet (600 mg total) by mouth 2 (two) times daily. 05/25/14   Regalado, Belkys A, MD  guaiFENesin (ROBITUSSIN) 100 MG/5ML liquid Take 100 mg by mouth 2 (two) times daily as needed for cough or congestion. Reported on 12/08/2015    [provider]  isosorbide dinitrate (ISORDIL) 30 MG tablet Take 0.5 tablets (15 mg total) by mouth 2 (two) times daily. 07/10/20   Richardson Dopp T, PA-C  LINZESS 72 MCG capsule Take 72 mcg by mouth daily. 11/24/20   [provider]  loratadine (CLARITIN) 10 MG tablet Take 10 mg by mouth daily.      [provider]  metoprolol tartrate  (LOPRESSOR) 25 MG tablet TAKE 1/2 TABLET BY MOUTH TWICE DAILY 09/15/20   Denita Lung, MD  nitroGLYCERIN (NITROSTAT) 0.4 MG SL tablet DISSOLVE 1 TABLET UNDER TONGUE EVERY 5 MINUTES AS NEEDED FOR CHEST PAIN 01/04/21   Burnell Blanks, MD  pantoprazole (PROTONIX) 40 MG tablet TAKE 1 TABLET BY MOUTH TWICE DAILY 12/04/20   Denita Lung, MD  polyethylene glycol powder (MIRALAX) 17 GM/SCOOP powder Take 17 g by mouth 2 (two) times daily as needed for moderate constipation. Patient not taking: Reported on 01/06/2021 04/27/20   Mercy Riding,  MD  rosuvastatin (CRESTOR) 40 MG tablet TAKE 1 TABLET BY MOUTH EVERY DAY 12/28/20   Denita Lung, MD  triamcinolone (KENALOG) 0.1 % Apply 1 application topically 2 (two) times daily. Patient not taking: No sig reported 05/21/20   Denita Lung, MD  VENTOLIN HFA 108 [id number] Base) MCG/ACT inhaler INHALE 2 PUFFS BY MOUTH EVERY 6 HOURS AS NEEDED FOR WHEEZING OR SHORTNESS OF BREATH 02/12/20   Denita Lung, MD    Inpatient Medications: Scheduled Meds:  Continuous Infusions:  PRN Meds:   Allergies:    Allergies  Allergen Reactions   Other Other (See Comments)    NO ACIDIC, TART< OR SPICY FOODS- DEVELOPS REFLUX OFTEN!!  PATIENT HAS TROUBLE SWALLOWING TABLETS!!   Bactrim [Sulfamethoxazole-Trimethoprim] Other (See Comments)    "makes her feel funny" or "unsteady"    Amitriptyline Hcl Rash   Aspirin Hives, Swelling, Rash and Other (See Comments)    Body became swollen   Tape Other (See Comments)    SKIN IS VERY THIN- WILL TEAR EASILY!!   Zetia [Ezetimibe] Rash    Social History:   Social History   Socioeconomic History   Marital status: Widowed    Spouse name: Not on file   Number of children: 3   Years of education: Not on file   Highest education level: Not on file  Occupational History    Employer: RETIRED  Tobacco Use   Smoking status: Never   Smokeless tobacco: Former    Types: Snuff    Quit date: 05/23/2001  Vaping Use   Vaping  Use: Never used  Substance and Sexual Activity   Alcohol use: No   Drug use: No   Sexual activity: Not Currently  Other Topics Concern   Not on file  Social History Narrative   Not on file   Social Determinants of Health   Financial Resource Strain: Not on file  Food Insecurity: Not on file  Transportation Needs: Not on file  Physical Activity: Not on file  Stress: Not on file  Social Connections: Not on file  Intimate Partner Violence: Not on file    Family History:   Family History  Problem Relation Age of Onset   Heart failure Mother    Other Father      ROS:  Please see the history of present illness.  All other ROS reviewed and negative.     Physical Exam/Data:   Vitals:   01/14/21 1700 01/14/21 1730 01/14/21 1800 01/14/21 1830  BP: (!) 183/72 (!) 181/79 (!) 154/71 (!) 166/81  Pulse: 72 65 75 61  Resp: (!) 24 (!) 30 (!) 22 19  Temp:      TempSrc:      SpO2: 98% 98% 97% 94%  Weight:      Height:       No intake or output data in the 24 hours ending 01/14/21 1859 Last 3 Weights 01/14/2021 01/13/2021 01/06/2021  Weight (lbs) 89 lb 15.2 oz 90 lb (No Data)  Weight (kg) 40.8 kg 40.824 kg (No Data)     Body mass index is 16.45 kg/m.  General: Very frail, elderly female, in no acute distress HEENT: normal for age, minor wound from her syncope Lymph: no adenopathy Neck: no JVD Endocrine:  No thryomegaly Vascular: No carotid bruits; 4/4 extremity pulses 2+ bilaterally Cardiac:  normal S1, S2; Irreg R&R; soft murmur  Lungs:  few rales bases bilaterally, no wheezing, rhonchi Abd: soft, nontender, no hepatomegaly  Ext: no edema Musculoskeletal:  No deformities, BUE and BLE strength normal and equal Skin: warm and dry  Neuro:  CNs 2-12 intact, no focal abnormalities noted Psych:  Normal affect   EKG:  The EKG was personally reviewed and demonstrates:  Atrial fib, slow VR, HR 52 Telemetry:  Telemetry was personally reviewed and demonstrates:  atrial fib, slow VR  at times w/ pauses > 6 sec, external pacing spikes seen at times.  Relevant CV Studies:  ECHO: 02/26/2020  1. Left ventricular ejection fraction, by estimation, is 55 to 60%. The  left ventricle has normal function. The left ventricle has no regional  wall motion abnormalities. There is mild asymmetric left ventricular  hypertrophy of the basal-septal segment.  Left ventricular diastolic function could not be evaluated.   2. Right ventricular systolic function is normal. The right ventricular  size is normal. There is mildly elevated pulmonary artery systolic  pressure. The estimated right ventricular systolic pressure is [id number] mmHg.   3. The mitral valve is degenerative. Mild mitral valve regurgitation. No evidence of mitral stenosis.   4. 23 mm S3 TAVR. V max 2.1 m/s, MG 8.4 mmHG, AVA 1.88 cm2, DI 0.60. No regurgitation or paravalvular leak. Normal prosthesis. The aortic valve has been repaired/replaced. Aortic valve regurgitation is not visualized. There is a 23 mm Sapien prosthetic  (TAVR) valve present in the aortic position. Procedure Date: 01/03/2017.   5. The inferior vena cava is normal in size with greater than 50%  respiratory variability, suggesting right atrial pressure of 3 mmHg.   Comparison(s): No significant change from prior study. Stable EF. Stable TAVR gradients.   Cardiac cath: 10/27/2016 Ost Cx to Prox Cx lesion, 50 %stenosed. Ost 2nd Mrg to 2nd Mrg lesion, 10 %stenosed. SVG graft was visualized by angiography and is normal in caliber. Mid RCA to Dist RCA lesion, 100 %stenosed. SVG graft was visualized by angiography and is normal in caliber. SVG graft was visualized by angiography and is normal in caliber. Mid LAD lesion, 100 %stenosed. 1st Diag lesion, 50 %stenosed. LIMA graft was visualized by non-selective angiography and is small. Dist LAD lesion, 40 %stenosed.   1. Severe triple vessel CAD s/p 4V CABG with 3/4 patent bypass grafts 2. The mid LAD is  chronically occluded. The vein graft to the mid LAD is patent 3. The proximal Circumflex has moderate stenosis. The mid Circumflex stent is patent. The vein graft to the second OM is patent 4. The mid RCA is chronically occluded. The vein graft to the distal RCA is patent 5. The LIMA to the Diagonal is atretic. The Diagonal branch as moderate non-obstructive disease.  6. Severe aortic stenosis (mean gradient 41.5 mmHg, peak to peak gradient 41 mmHg, AVA 0.76cm2).    Recommendations: Will continue planning for TAVR. She will be referred to see the CT surgeons on our TAVR team and will need to have her pre op testing arranged   Diagnostic Dominance: Right   Laboratory Data:  High Sensitivity Troponin:   Recent Labs  Lab 01/14/21 1643  TROPONINIHS 16     Chemistry Recent Labs  Lab 01/13/21 2142 01/14/21 1643  NA 136 140  K 3.6 4.0  CL 99 104  CO2 28 25  GLUCOSE 133* 153*  BUN 9 8  CREATININE 0.53 0.54  CALCIUM 9.5 9.6  GFRNONAA >60 >60  ANIONGAP 9 11    No results for input(s): PROT, ALBUMIN, AST, ALT, ALKPHOS, BILITOT in the last 168 hours. Hematology Recent Labs  Lab 01/13/21 2142 01/14/21  1643  WBC 4.5 6.4  RBC 3.95 4.01  HGB 12.1 11.7*  HCT 36.3 37.4  MCV 91.9 93.3  MCH 30.6 29.2  MCHC 33.3 31.3  RDW 13.4 13.3  PLT 186 187   BNPNo results for input(s): BNP, PROBNP in the last 168 hours.  DDimer No results for input(s): DDIMER in the last 168 hours. Lab Results  Component Value Date   TSH 2.936 05/19/2014   Lab Results  Component Value Date   HGBA1C 5.7 (H) 12/26/2016   Lab Results  Component Value Date   CHOL 151 11/13/2019   HDL 77 11/13/2019   LDLCALC 58 11/13/2019   TRIG 84 11/13/2019   CHOLHDL 2.0 11/13/2019     Radiology/Studies:  DG Chest Port 1 View  Result Date: 01/14/2021 CLINICAL DATA:  Atrial fibrillation and syncope EXAM: PORTABLE CHEST 1 VIEW COMPARISON:  01/13/2021 FINDINGS: Numerous leads and wires project over the chest. Prior  median sternotomy. CABG. Midline trachea. Normal heart size. Atherosclerosis in the transverse aorta. Possible trace right pleural fluid. Right costophrenic angle partially excluded. No pneumothorax. No congestive failure. Right lung base calcified granuloma. No lobar consolidation. IMPRESSION: No acute cardiopulmonary disease. Aortic Atherosclerosis (ICD10-I70.0). Possible trace right pleural fluid. Electronically Signed   By: Abigail Miyamoto M.D.   On: 01/14/2021 17:42   DG Chest Port 1 View  Result Date: 01/13/2021 CLINICAL DATA:  Headache and nausea this morning. Anxious and short of breath throughout the day. EXAM: PORTABLE CHEST 1 VIEW COMPARISON:  04/23/2020 FINDINGS: Postoperative changes in the mediastinum. Normal heart size and pulmonary vascularity. Cardiac valve prosthesis. Emphysematous changes in the lungs. Fibrosis in the upper lungs. Peribronchial thickening suggesting bronchitic change. Calcified granuloma in the right base. Mild blunting of the right costophrenic angle is unchanged since prior study, likely pleural thickening. Calcification of the aorta. Thoracolumbar scoliosis with degenerative change. IMPRESSION: Emphysematous and chronic bronchitic changes in the lungs. Fibrosis in the apices. Right pleural thickening. No active pulmonary disease. Electronically Signed   By: Lucienne Capers M.D.   On: 01/13/2021 21:55     Assessment and Plan:   Atrial fib, slow VR - add DBA for increased HR, try to keep HR > 50 - continue external pacing for HR sustained < 40 - EP to see in am to discuss PPM - hold BB, but the dose had already been decreased for bradycardia, doubt that Lopressor 12.5 mg bid is making much of a difference - discuss w/ MD if we should continue low-dose Eliquis or hold and start heparin  2. Early satiety, wt loss - IM consult for these issues, deconditioning and possible FTT.  3. CODE status - discussed w/ pt/dtr, pt stated she wanted CPR, resucitation   Risk  Assessment/Risk Scores:      CHA2DS2-VASc Score = 7  This indicates a 11.2% annual risk of stroke. The patient's score is based upon: CHF History: No HTN History: Yes Diabetes History: No Stroke History: Yes Vascular Disease History: Yes Age Score: 2 Gender Score: 1   For questions or updates, please contact [place] Please consult [email or web address] for contact info under    Signed, Rosaria Ferries, PA-C  01/14/2021 6:59 PM  Personally seen and examined. Agree with APP above with the following comments: Briefly 87 yo F with profound failure to thrive, profound malnutrition, asthma,  who represents with shortness of breath with new pacing burden   Patient notes that she lost consciousness when sitting at the kitchen table.  She has had since  had syncopal episodes in the ED with ~ 6.2 second pauses.  Resolved with transcutaneous  Pacing.  Patient notes she has had a profound weight loss that she attributes to poor appetite.  Discussing with patient and daughter, notes that she eats very little unless she is in the hospital.  Notes abdominal pain and that she has a small hernia that may be causing this problem.  Exam notable for very frail woman in irregularly irregular heart rate with rates above 50.    Labs notable for K 3.4, CO 25  no metabolic etiology   Personally reviewed relevant tests; Two 6.5+ second AF pauses on telemetry Would recommend  - had significant concerns that patient's FTT may keep patient from being ideal PPM candidate.  Nonetheless, patient's GOC is presently for full at this time, also his daughter's wishes.  Patient has intermittent pacing needs, requiring ICU level of care; patient will be admitted until cardiac issues are resolved; there is concern that she will need transfer for her significant FTT post resolution of her heart block - decreased back up rate to 40 BPM  given need for TCP, ICU admission is warranted - NPO at midnight  - echo tomorrow -  change xarelto to heparin - hold BB - EP consult tomorrow morning - starting dopamine 5 mcg - if significant pacing burden despite dopamine will reach out to interventional team for temp wire; on call IC is aware  Low threshold to consult critical care for assistance Consult [facility] colleagues to help with her non-cardiac comorbidity  Labs for tomorrow  Heparin for DVT PPX  Discussed with patient and daughter: Full CODE presently  CRITICAL CARE Performed by: Cory Kitt A Kyng Matlock  Total critical care time: 70 minutes. Critical care time was exclusive of separately billable procedures and treating other patients. Critical care was necessary to treat or prevent imminent or life-threatening deterioration. Critical care was time spent personally by me on the following activities: development of treatment plan with patient and/or surrogate as well as nursing, discussions with consultants, evaluation of patient's response to treatment, examination of patient, obtaining history from patient or surrogate, ordering and performing treatments and interventions, ordering and review of laboratory studies, ordering and review of radiographic studies, pulse oximetry and re-evaluation of patient's condition.    Signed, Rudean Haskell, [place]  01/14/2021 7:41 PM

## 2021-01-15 ENCOUNTER — Inpatient Hospital Stay: Payer: Self-pay

## 2021-01-15 ENCOUNTER — Inpatient Hospital Stay (HOSPITAL_COMMUNITY): Payer: Medicare Other

## 2021-01-15 ENCOUNTER — Encounter (HOSPITAL_COMMUNITY)
Admission: EM | Disposition: A | Discharge status: Skilled Nursing Facility | Payer: Self-pay | Source: Home / Self Care | Attending: Internal Medicine

## 2021-01-15 DIAGNOSIS — I4430 Unspecified atrioventricular block: Secondary | ICD-10-CM

## 2021-01-15 DIAGNOSIS — I9589 Other hypotension: Secondary | ICD-10-CM

## 2021-01-15 DIAGNOSIS — Z952 Presence of prosthetic heart valve: Secondary | ICD-10-CM | POA: Diagnosis not present

## 2021-01-15 DIAGNOSIS — R55 Syncope and collapse: Secondary | ICD-10-CM | POA: Diagnosis not present

## 2021-01-15 DIAGNOSIS — I4891 Unspecified atrial fibrillation: Secondary | ICD-10-CM | POA: Diagnosis not present

## 2021-01-15 DIAGNOSIS — D649 Anemia, unspecified: Secondary | ICD-10-CM

## 2021-01-15 HISTORY — PX: PACEMAKER IMPLANT: EP1218

## 2021-01-15 LAB — COMPREHENSIVE METABOLIC PANEL
ALT: 15 U/L (ref 0–44)
AST: 18 U/L (ref 15–41)
Albumin: 4 g/dL (ref 3.5–5.0)
Alkaline Phosphatase: 41 U/L (ref 38–126)
Anion gap: 12 (ref 5–15)
BUN: 8 mg/dL (ref 8–23)
CO2: 25 mmol/L (ref 22–32)
Calcium: 9.8 mg/dL (ref 8.9–10.3)
Chloride: 102 mmol/L (ref 98–111)
Creatinine, Ser: 0.51 mg/dL (ref 0.44–1.00)
GFR, Estimated: >60 mL/min (ref >60)
Glucose, Bld: 114 mg/dL — ABNORMAL HIGH (ref 70–99)
Potassium: 2.8 mmol/L — ABNORMAL LOW (ref 3.5–5.1)
Sodium: 139 mmol/L (ref 135–145)
Total Bilirubin: 1.1 mg/dL (ref 0.3–1.2)
Total Protein: 7.1 g/dL (ref 6.5–8.1)

## 2021-01-15 LAB — SARS CORONAVIRUS 2 (TAT 6-24 HRS): SARS Coronavirus 2: NEGATIVE

## 2021-01-15 LAB — APTT: aPTT: 67 seconds — ABNORMAL HIGH (ref 24–36)

## 2021-01-15 LAB — HEPARIN LEVEL (UNFRACTIONATED): Heparin Unfractionated: >1.1 IU/mL — ABNORMAL HIGH (ref 0.30–0.70)

## 2021-01-15 LAB — SURGICAL PCR SCREEN
MRSA, PCR: NEGATIVE
Staphylococcus aureus: NEGATIVE

## 2021-01-15 LAB — MAGNESIUM: Magnesium: 2.1 mg/dL (ref 1.7–2.4)

## 2021-01-15 LAB — ECHOCARDIOGRAM LIMITED
Height: 62 in
S' Lateral: 2.17 cm
Weight: 1435.64 oz

## 2021-01-15 LAB — FOLATE: Folate: 8.3 ng/mL (ref >5.9)

## 2021-01-15 LAB — POTASSIUM: Potassium: 3.2 mmol/L — ABNORMAL LOW (ref 3.5–5.1)

## 2021-01-15 SURGERY — PACEMAKER IMPLANT

## 2021-01-15 MED ORDER — SODIUM CHLORIDE 0.9% FLUSH: 3.0000 mL | INTRAVENOUS | Status: DC | PRN

## 2021-01-15 MED ORDER — HYDROCODONE-ACETAMINOPHEN 5-325 MG PO TABS
1.0000 | ORAL_TABLET | Freq: Once | ORAL | Status: AC | PRN
  Administered 2021-01-15: 1 via ORAL
  Filled 2021-01-15: qty 1

## 2021-01-15 MED ORDER — GENTAMICIN SULFATE 40 MG/ML IJ SOLN
80.0000 mg | INTRAMUSCULAR | Status: AC
  Administered 2021-01-15: 80 mg
  Filled 2021-01-15: qty 2

## 2021-01-15 MED ORDER — CHLORHEXIDINE GLUCONATE 4 % EX LIQD
60.0000 mL | Freq: Once | CUTANEOUS | Status: AC
Start: 2021-01-15 — End: 2021-01-15
  Administered 2021-01-15: 4 via TOPICAL

## 2021-01-15 MED ORDER — THIAMINE HCL 100 MG/ML IJ SOLN
100.0000 mg | Freq: Every day | INTRAMUSCULAR | Status: DC
  Administered 2021-01-16 – 2021-01-19 (×4): 100 mg via INTRAVENOUS
  Filled 2021-01-15 (×4): qty 2

## 2021-01-15 MED ORDER — PHENTOLAMINE MESYLATE 5 MG IJ SOLR
5.0000 mg | Freq: Once | INTRAMUSCULAR | Status: AC
  Administered 2021-01-15: 5 mg via SUBCUTANEOUS
  Filled 2021-01-15: qty 5

## 2021-01-15 MED ORDER — LIDOCAINE HCL (PF) 1 % IJ SOLN
INTRAMUSCULAR | Status: AC
  Filled 2021-01-15: qty 60

## 2021-01-15 MED ORDER — SODIUM CHLORIDE 0.9 % IV SOLN
INTRAVENOUS | Status: DC
Start: 2021-01-15 — End: 2021-01-15

## 2021-01-15 MED ORDER — SODIUM CHLORIDE 0.9 % IV SOLN
250.0000 mL | INTRAVENOUS | Status: DC
  Administered 2021-01-15: 250 mL via INTRAVENOUS

## 2021-01-15 MED ORDER — POTASSIUM CHLORIDE CRYS ER 20 MEQ PO TBCR
40.0000 meq | EXTENDED_RELEASE_TABLET | Freq: Once | ORAL | Status: AC
  Administered 2021-01-15: 40 meq via ORAL
  Filled 2021-01-15: qty 2

## 2021-01-15 MED ORDER — POTASSIUM CHLORIDE 10 MEQ/100ML IV SOLN: 10.0000 meq | INTRAVENOUS | Status: DC

## 2021-01-15 MED ORDER — LIDOCAINE HCL (PF) 1 % IJ SOLN
INTRAMUSCULAR | Status: DC | PRN
  Administered 2021-01-15: 45 mL

## 2021-01-15 MED ORDER — HEPARIN (PORCINE) IN NACL 1000-0.9 UT/500ML-% IV SOLN
INTRAVENOUS | Status: AC
  Filled 2021-01-15: qty 500

## 2021-01-15 MED ORDER — NOREPINEPHRINE 4 MG/250ML-% IV SOLN: 0.0000 ug/min | INTRAVENOUS | Status: DC

## 2021-01-15 MED ORDER — SODIUM CHLORIDE 0.9 % IV SOLN: 250.0000 mL | INTRAVENOUS | Status: DC

## 2021-01-15 MED ORDER — SODIUM CHLORIDE 0.9 % IV BOLUS
500.0000 mL | Freq: Once | INTRAVENOUS | Status: AC
  Administered 2021-01-15: 500 mL via INTRAVENOUS

## 2021-01-15 MED ORDER — CHLORHEXIDINE GLUCONATE CLOTH 2 % EX PADS
6.0000 | MEDICATED_PAD | Freq: Every day | CUTANEOUS | Status: DC
  Administered 2021-01-16 – 2021-01-17 (×2): 6 via TOPICAL

## 2021-01-15 MED ORDER — NITROGLYCERIN 2 % TD OINT
1.0000 [in_us] | TOPICAL_OINTMENT | Freq: Three times a day (TID) | TRANSDERMAL | Status: DC
  Filled 2021-01-15: qty 30

## 2021-01-15 MED ORDER — CEFAZOLIN SODIUM-DEXTROSE 2-4 GM/100ML-% IV SOLN
2.0000 g | INTRAVENOUS | Status: AC
  Administered 2021-01-15: 2 g via INTRAVENOUS
  Filled 2021-01-15: qty 100

## 2021-01-15 MED ORDER — HEPARIN (PORCINE) IN NACL 1000-0.9 UT/500ML-% IV SOLN
INTRAVENOUS | Status: DC | PRN
  Administered 2021-01-15: 500 mL

## 2021-01-15 MED ORDER — SODIUM CHLORIDE 0.9 % IV SOLN
INTRAVENOUS | Status: AC
  Filled 2021-01-15: qty 2

## 2021-01-15 MED ORDER — NOREPINEPHRINE 4 MG/250ML-% IV SOLN
2.0000 ug/min | INTRAVENOUS | Status: DC
  Administered 2021-01-15: 2 ug/min via INTRAVENOUS
  Filled 2021-01-15: qty 250

## 2021-01-15 MED ORDER — BUPIVACAINE HCL (PF) 0.25 % IJ SOLN
INTRAMUSCULAR | Status: AC
  Filled 2021-01-15: qty 60

## 2021-01-15 MED ORDER — CEFAZOLIN SODIUM-DEXTROSE 2-4 GM/100ML-% IV SOLN
INTRAVENOUS | Status: AC
  Filled 2021-01-15: qty 100

## 2021-01-15 MED ORDER — SODIUM CHLORIDE 0.9% FLUSH
3.0000 mL | Freq: Two times a day (BID) | INTRAVENOUS | Status: DC
Start: 2021-01-15 — End: 2021-01-19
  Administered 2021-01-15 – 2021-01-19 (×9): 3 mL via INTRAVENOUS

## 2021-01-15 MED ORDER — CHLORHEXIDINE GLUCONATE 4 % EX LIQD
CUTANEOUS | Status: AC
  Filled 2021-01-15: qty 30

## 2021-01-15 MED ORDER — CHLORHEXIDINE GLUCONATE 4 % EX LIQD: 60.0000 mL | Freq: Once | CUTANEOUS | Status: DC

## 2021-01-15 MED ORDER — CEFAZOLIN SODIUM-DEXTROSE 1-4 GM/50ML-% IV SOLN
1.0000 g | Freq: Four times a day (QID) | INTRAVENOUS | Status: AC
  Administered 2021-01-15 – 2021-01-16 (×3): 1 g via INTRAVENOUS
  Filled 2021-01-15 (×3): qty 50

## 2021-01-15 SURGICAL SUPPLY — 8 items
CABLE SURGICAL S-101-97-12 (CABLE) ×2 IMPLANT
KIT MICROPUNCTURE NIT STIFF (SHEATH) ×2 IMPLANT
LEAD TENDRIL MRI 46CM LPA1200M (Lead) ×1 IMPLANT
LEAD TENDRIL MRI 52CM LPA1200M (Lead) ×1 IMPLANT
PACEMAKER ASSURITY DR-RF (Pacemaker) ×1 IMPLANT
PAD PRO RADIOLUCENT 2001M-C (PAD) ×2 IMPLANT
SHEATH 8FR PRELUDE SNAP 13 (SHEATH) ×2 IMPLANT
TRAY PACEMAKER INSERTION (PACKS) ×2 IMPLANT

## 2021-01-15 NOTE — Progress Notes (Addendum)
PIV consult: PIV options are limited. L arm restricted due to pacemaker placement per RN. Long 22g inserted RUE with US guidance just superior to outline for previous infiltration.  Requested RN place IV watch device if pressors are to be infused. Please consider central line if pt will require prolonged venous access.

## 2021-01-15 NOTE — Progress Notes (Addendum)
Progress Note  Patient Name: [name] Date of Encounter: 01/15/2021  Primary Cardiologist: Lauree Chandler, MD   Subjective   Patient feels better from overnight.  Notes that she is hungry.  Had IV infiltration with no pain at site.  Inpatient Medications    Scheduled Meds:  budesonide  0.5 mg Inhalation BID   Chlorhexidine Gluconate Cloth  6 each Topical Daily   cholecalciferol  1,000 Units Oral Daily   docusate sodium  100 mg Oral Daily   feeding supplement  237 mL Oral BID BM   fluticasone  2 spray Each Nare Daily   guaiFENesin  600 mg Oral BID   isosorbide dinitrate  15 mg Oral BID   linaclotide  72 mcg Oral Q breakfast   loratadine  10 mg Oral Daily   nitroGLYCERIN  1 inch Topical Q8H   pantoprazole  40 mg Oral BID   rosuvastatin  40 mg Oral Daily   Continuous Infusions:  DOPamine 10 mcg/kg/min (01/15/21 0600)   heparin 600 Units/hr (01/15/21 0600)   PRN Meds: acetaminophen, albuterol, ALPRAZolam, diazepam, guaiFENesin, nitroGLYCERIN, ondansetron (ZOFRAN) IV, polyethylene glycol, zolpidem   Vital Signs    Vitals:   01/15/21 0500 01/15/21 0515 01/15/21 0530 01/15/21 0600  BP:    132/71  Pulse:      Resp: (!) 22 (!) 23 (!) 25 (!) 23  Temp:      TempSrc:      SpO2: 96% 96% 97% 95%  Weight:      Height:        Intake/Output Summary (Last 24 hours) at 01/15/2021 0731 Last data filed at 01/15/2021 0600 Gross per 24 hour  Intake 142.11 ml  Output 700 ml  Net -557.89 ml   Filed Weights   01/14/21 1642 01/15/21 0450  Weight: 40.8 kg 40.7 kg    Telemetry    AF with occasional pauses (4s pauses X2) on dopamine - Personally Reviewed  ECG    AR with PVCs rates 80s - Personally Reviewed  Physical Exam   GEN: No acute distress.  Frail and cachetic Neck: No JVD Cardiac: IRIR, systolic crescendo murmur  Respiratory: Clear to auscultation bilaterally. GI: Soft, slightly tender RLQ with no rebound MS: No edema; No pain a RIV infiltration  site Neuro:  Nonfocal  Psych: Normal affect   Labs    Chemistry Recent Labs  Lab 01/13/21 2142 01/14/21 1643 01/15/21 0607  NA 136 140 139  K 3.6 4.0 2.8*  CL 99 104 102  CO2 '28 25 25  '$ GLUCOSE 133* 153* 114*  BUN '9 8 8  '$ CREATININE 0.53 0.54 0.51  CALCIUM 9.5 9.6 9.8  PROT  --   --  7.1  ALBUMIN  --   --  4.0  AST  --   --  18  ALT  --   --  15  ALKPHOS  --   --  41  BILITOT  --   --  1.1  GFRNONAA >60 >60 >60  ANIONGAP '9 11 12     '$ Hematology Recent Labs  Lab 01/13/21 2142 01/14/21 1643 01/14/21 2040  WBC 4.5 6.4 8.5  RBC 3.95 4.01 4.29  4.25  HGB 12.1 11.7* 12.8  HCT 36.3 37.4 38.9  MCV 91.9 93.3 90.7  MCH 30.6 29.2 29.8  MCHC 33.3 31.3 32.9  RDW 13.4 13.3 13.4  PLT 186 187 180    Cardiac EnzymesNo results for input(s): TROPONINI in the last 168 hours. No results for input(s):  TROPIPOC in the last 168 hours.   BNPNo results for input(s): BNP, PROBNP in the last 168 hours.   DDimer No results for input(s): DDIMER in the last 168 hours.   Radiology    DG Chest Port 1 View  Result Date: 01/14/2021 CLINICAL DATA:  Atrial fibrillation and syncope EXAM: PORTABLE CHEST 1 VIEW COMPARISON:  01/13/2021 FINDINGS: Numerous leads and wires project over the chest. Prior median sternotomy. CABG. Midline trachea. Normal heart size. Atherosclerosis in the transverse aorta. Possible trace right pleural fluid. Right costophrenic angle partially excluded. No pneumothorax. No congestive failure. Right lung base calcified granuloma. No lobar consolidation. IMPRESSION: No acute cardiopulmonary disease. Aortic Atherosclerosis (ICD10-I70.0). Possible trace right pleural fluid. Electronically Signed   By: Abigail Miyamoto M.D.   On: 01/14/2021 17:42   DG Chest Port 1 View  Result Date: 01/13/2021 CLINICAL DATA:  Headache and nausea this morning. Anxious and short of breath throughout the day. EXAM: PORTABLE CHEST 1 VIEW COMPARISON:  04/23/2020 FINDINGS: Postoperative changes in the  mediastinum. Normal heart size and pulmonary vascularity. Cardiac valve prosthesis. Emphysematous changes in the lungs. Fibrosis in the upper lungs. Peribronchial thickening suggesting bronchitic change. Calcified granuloma in the right base. Mild blunting of the right costophrenic angle is unchanged since prior study, likely pleural thickening. Calcification of the aorta. Thoracolumbar scoliosis with degenerative change. IMPRESSION: Emphysematous and chronic bronchitic changes in the lungs. Fibrosis in the apices. Right pleural thickening. No active pulmonary disease. Electronically Signed   By: Lucienne Capers M.D.   On: 01/13/2021 21:55    Cardiac Studies   ECHO: 02/26/2020  1. Left ventricular ejection fraction, by estimation, is 55 to 60%. The  left ventricle has normal function. The left ventricle has no regional  wall motion abnormalities. There is mild asymmetric left ventricular  hypertrophy of the basal-septal segment.  Left ventricular diastolic function could not be evaluated.   2. Right ventricular systolic function is normal. The right ventricular  size is normal. There is mildly elevated pulmonary artery systolic  pressure. The estimated right ventricular systolic pressure is [id number] mmHg.   3. The mitral valve is degenerative. Mild mitral valve regurgitation. No evidence of mitral stenosis.   4. 23 mm S3 TAVR. V max 2.1 m/s, MG 8.4 mmHG, AVA 1.88 cm2, DI 0.60. No regurgitation or paravalvular leak. Normal prosthesis. The aortic valve has been repaired/replaced. Aortic valve regurgitation is not visualized. There is a 23 mm Sapien prosthetic  (TAVR) valve present in the aortic position. Procedure Date: 01/03/2017.   5. The inferior vena cava is normal in size with greater than 50%  respiratory variability, suggesting right atrial pressure of 3 mmHg.   Comparison(s): No significant change from prior study. Stable EF. Stable TAVR gradients.    Cardiac cath: 10/27/2016 Ost Cx to Prox  Cx lesion, 50 %stenosed. Ost 2nd Mrg to 2nd Mrg lesion, 10 %stenosed. SVG graft was visualized by angiography and is normal in caliber. Mid RCA to Dist RCA lesion, 100 %stenosed. SVG graft was visualized by angiography and is normal in caliber. SVG graft was visualized by angiography and is normal in caliber. Mid LAD lesion, 100 %stenosed. 1st Diag lesion, 50 %stenosed. LIMA graft was visualized by non-selective angiography and is small. Dist LAD lesion, 40 %stenosed.   1. Severe triple vessel CAD s/p 4V CABG with 3/4 patent bypass grafts 2. The mid LAD is chronically occluded. The vein graft to the mid LAD is patent 3. The proximal Circumflex has moderate stenosis. The mid  Circumflex stent is patent. The vein graft to the second OM is patent 4. The mid RCA is chronically occluded. The vein graft to the distal RCA is patent 5. The LIMA to the Diagonal is atretic. The Diagonal branch as moderate non-obstructive disease.  6. Severe aortic stenosis (mean gradient 41.5 mmHg, peak to peak gradient 41 mmHg, AVA 0.76cm2).    Recommendations: Will continue planning for TAVR. She will be referred to see the CT surgeons on our TAVR team and will need to have her pre op testing arranged   Patient Profile     87 y.o. female hx of CAD s/p prior PCI and 2006 CABG, PAF s/p 2006 MAZE, Severe AS s/p 2018 TAVR, prior hx of CVA and CAS s/p R CEA, HTN and HLD, prior history of 2nd HB and LBBB who presented for re-evaluation in the ED for second evaluation for SOB and weakness.  Had syncope associated with 6+s pauses.   Assessment & Plan    PAF with pauses greater than 5 s - has external pacing pads on with back up rate of 40 and dopamine 5 mcg running; EP consulted this AM - PICC order placed; unclear how long patient will need PICC, but has both access issues and I am unclear our long term plan to address her malnutrition; if she were to need TPN access would be reasonable - CHADVASC of 7; on heparin until  decision of PPM - getting echo prior to potential PPM - low dose lopressor has been held  Hypotension - suspect this is related to intravascular volume depletion - giving 500 CC bolus - if unable to wean off dopamine because of MAP < 60; can switch to levophed; order set in place  Hypokalemia - repleted and will start standing  CAD s/p CABG  HTN and HLD Prior CVA and CAS s/p CEA - No angina or evidence of MI, on AC as above and imdur 15 mg PO daily -Continue rosuvastatin - Has given nitro past overnight; troponin not suggestive of ischemia; if needed again can increase Imdur to 30 mg - Dc'ed Imdur  Severe AS s/p TAVR - Echo pending as above  Severe Malnutrition & Debility - Will consult nutrition; NPO until decision about PPM - Post PPM decision, will consult PT/OT - appreciate TRH recs - through course we have discussed her finances "I am the poorest patient you will ever have,"  Daughter and patient have noted how good the food is at  [facility].  I do have concerns about patient food insecurity.  Will reach out to social work to see if peri-discharge and resources are available  Constipation - held AM linzess; had BM today; will keep on MAR as we suspect she will eat today  Hx of GI bleed, abdominal pain - TRH following; appreciate recs  Code Status:  Full; decision with patient and daugther  DVT PPX heparin  Diet: NPO as above  Labs Ordered (including phos; baseline prior to feeding)   CRITICAL CARE Performed by: Sy Saintjean A Briona Korpela  Total critical care time: 45 minutes. Critical care time was exclusive of separately billable procedures and treating other patients. Critical care was necessary to treat or prevent imminent or life-threatening deterioration. Critical care was time spent personally by me on the following activities: development of treatment plan with patient and/or surrogate as well as nursing, discussions with consultants, evaluation of patient's response  to treatment, examination of patient, obtaining history from patient or surrogate, ordering and performing treatments and  interventions, ordering and review of laboratory studies, ordering and review of radiographic studies, pulse oximetry and re-evaluation of patient's condition. Discussed with patient, daughter, EP, nursing, and pharmacy.    Signed, Rudean Haskell, MD [place]  01/15/2021 8:22 AM     For questions or updates, please contact [place] Please consult [email or web address] for contact info under Cardiology/STEMI.      Signed, Werner Lean, MD  01/15/2021, 7:31 AM

## 2021-01-15 NOTE — Interval H&P Note (Signed)
History and Physical Interval Note:  01/15/2021 11:50 AM  [name]  has presented today for surgery, with the diagnosis of bradycardia.  The various methods of treatment have been discussed with the patient and family. After consideration of risks, benefits and other options for treatment, the patient has consented to  Procedure(s): PACEMAKER IMPLANT (N/A) as a surgical intervention.  The patient's history has been reviewed, patient examined, no change in status, stable for surgery.  I have reviewed the patient's chart and labs.  Questions were answered to the patient's satisfaction.     [name] [employer]

## 2021-01-15 NOTE — Progress Notes (Signed)
Pt's arm from dopamine infiltration appears more ecchymotic and  small hematoma over bicep.  Will continue to monitor if looks necrotic would ask surgery to eval.

## 2021-01-15 NOTE — Consult Note (Addendum)
Cardiology Consultation:   Patient ID: [name] MRN: [id number]; DOB: 10/12/1928  Admit date: 01/14/2021 Date of Consult: 01/15/2021  PCP:  Denita Lung, MD   [facility] Providers Cardiologist:  Lauree Chandler, MD EP: Dr. Curt Bears  Patient Profile:   [name] is a 87 y.o. female with a hx of CAD (CABG and MAZE 2006), AFib, prior stroke, PVD (R CEA ), VHD severe AS > TAVR (2018), HTN, HLD,  who is being seen 01/15/2021 for the evaluation of syncope and ppauses at the request of Chandrasekhar.  History of Present Illness:   [name] was referred to Dr. Curt Bears back in 2018 post TAVR developed new LBBB and abnormal monitor with pauses, and 2nd degree AVblock (?).  Dr. Curt Bears discussed her Cardiac monitor showed heart rate of 48 at the lowest. It is unclear as to whether or not her symptoms are due to slow heart rates. Based on the monitor, she is having episodes of sinus pauses lasting up to second and a half. planned to stop diltiazem and start 12.5 mg of metoprolol.  She has been followed by cardiology out patient since then, last was feb this year a virtual visit, had been hospitalized dec 2021 for LGIB (suspect diverticular), had done well since, occ symptoms of angina off imdur, no symptoms of bradycardia, no reports of syncope.  She was admitted to [facility] yesterday after c/o some degree of SOB and a syncopal episode while eating breakfast, in the ER she was observed to have AFib with pauses as long as 6 seconds.  Pt and family report poor appetite of late with reduced oral intake/anorexia. Current weight 40.7kg  She was admitted, pacer pad placed, started on dopamine, and EP consulted by attending cardiologist who also notes concerns of significant malnutrition and failure to thrive of late, though with discussions with pt and family, continued full code at this time Her home small dose of BB was held  LABS K+ 3.6 > 4.0 >>>2.8 replacement ordered  already BUN/Creat 9/0.53 >>  8/0.51 HS Trop 16 > 19  WBC 4.5 >>> 8.5 H/H 12/36 >>> 12/38 Plts 187 >> 180 TSH 2.243   Home Meds include metoprolol tart 12.'5mg'$  BID Last dose was yesterday about 1300 No other noted nodal blocking agents  Her dopamine was changed to levophed this AM 2/2 persistent lower BPds currently at 2  The patient since Dec hospitalization has had marked functional decline having been ambulatory (minimally) with a walker to essentially non-ambulatory, they report 2/2 legs are weak and she has poor balance. Appetite has also steadily declined this a longer timeframe, probably a year or so, though worse in the last 56moor so with persistent nausea and early satiety. Daugher estimates a year ago she weighed 95lbs, current weight is 89-90lbs  In the last 2 weeks she has been having episodic SOB, feeling of needing more air and dizzy spells. Yesterday the 1st time she fainted.  She is currently intermittently externally paced (set at 40) though often is inappropriate, some despite pressor is appropriate as well, thankfully well tolerated. I have asked back up pacing be set to 30   Past Medical History:  Diagnosis Date   Anxiety    Aortic Stenosis s/p TAVR    Echo 10/21: EF 55-60, no RWMA, mild asymmetric LVH, normal RVSF, RVSP 40.2 mmHg, mild MR, s/p TAVR with mean gradient 8.436mg, no PVL   Arthritis    OSTEO   Aspirin allergy    on  Plavix   Asthma    Bronchitis    CAD (coronary artery disease)    a. s/p CABG 2006 with Cox-Maze procedure.   Carotid artery disease ([place])    a. s/p R CEA.   Chronic stable angina (HCC)    Diastolic dysfunction    Eczema    Fibromyalgia    GERD (gastroesophageal reflux disease)    Heart murmur    Hiatal hernia    Hyperlipidemia    Hypertension    Kyphoscoliosis    Mitral regurgitation    Myocardial infarction [facility]) 2003   Stent to CFX   PAF (paroxysmal atrial fibrillation) ([place])    a. pt has h/o hematuria on Eliquis and  has since refused anticoagulation   Pneumonia 2015 ?   Pulmonary regurgitation    Skin cancer of arm    Stroke [facility])    Tricuspid regurgitation     Past Surgical History:  Procedure Laterality Date   ABDOMINAL HYSTERECTOMY  1976   CAROTID ENDARTERECTOMY  2010   CORONARY ANGIOPLASTY WITH STENT PLACEMENT     CORONARY ARTERY BYPASS GRAFT  01/2005   LIMA-D1; SVG-LAD; SVG-OM; SVG-PDA   EYE SURGERY     MULTIPLE EXTRACTIONS WITH ALVEOLOPLASTY  12/28/2016   Extraction of tooth #'s 2- 5,7-10, [id number] 22-29 with alveoloplasty and maxillary right and left lateral exostoses reductions   MULTIPLE EXTRACTIONS WITH ALVEOLOPLASTY N/A 12/28/2016   Procedure: Extraction of tooth #'s 2- 5,7-10, 12,13,17-20,and 22-29 with alveoloplasty and maxillary right and left lateral exostoses reductions;  Surgeon: Lenn Cal, DDS;  Location: [place];  Service: Oral Surgery;  Laterality: N/A;   RIGHT/LEFT HEART CATH AND CORONARY/GRAFT ANGIOGRAPHY N/A 10/27/2016   Procedure: Right/Left Heart Cath and Coronary/Graft Angiography;  Surgeon: Burnell Blanks, MD;  Location: [place] CV [facility];  Service: Cardiovascular;  Laterality: N/A;   TEE WITHOUT CARDIOVERSION N/A 01/03/2017   Procedure: TRANSESOPHAGEAL ECHOCARDIOGRAM (TEE);  Surgeon: Burnell Blanks, MD;  Location: [place];  Service: Open Heart Surgery;  Laterality: N/A;   TONSILLECTOMY     TRANSCATHETER AORTIC VALVE REPLACEMENT, TRANSFEMORAL N/A 01/03/2017   Procedure: TRANSCATHETER AORTIC VALVE REPLACEMENT, TRANSFEMORAL;  Surgeon: Burnell Blanks, MD;  Location: [place];  Service: Open Heart Surgery;  Laterality: N/A;   TUMOR REMOVAL       Home Medications:  Prior to Admission medications   Medication Sig Start Date End Date Taking? Authorizing Provider  acetaminophen (TYLENOL) 650 MG CR tablet Take 1,300 mg by mouth 2 (two) times daily as needed for pain.    Yes [provider]  Cholecalciferol (VITAMIN D-3) 25 MCG (1000 UT)  CAPS Take 1,000 Units by mouth daily.   Yes [provider]  diazepam (VALIUM) 2 MG tablet Take 1 tablet (2 mg total) by mouth every 6 (six) hours as needed for anxiety. 11/02/18  Yes Denita Lung, MD  docusate sodium (COLACE) 100 MG capsule Take 1 capsule (100 mg total) by mouth daily. 04/27/20  Yes Gonfa, Charlesetta Ivory, MD  ELIQUIS 2.5 MG TABS tablet TAKE 1 TABLET(2.5 MG) BY MOUTH TWICE DAILY Patient taking differently: Take 2.5 mg by mouth 2 (two) times daily. 06/24/20  Yes Burnell Blanks, MD  Ensure Plus (ENSURE PLUS) LIQD Take 237 mLs by mouth 2 (two) times daily between meals.   Yes [provider]  FLOVENT HFA 220 MCG/ACT inhaler INHALE 2 PUFFS INTO THE LUNGS TWICE DAILY 11/18/19  Yes Denita Lung, MD  fluticasone (FLONASE) 50 MCG/ACT nasal spray SHAKE  LIQUID AND USE 2 SPRAYS IN EACH NOSTRIL EVERY DAY 09/01/20  Yes Denita Lung, MD  guaiFENesin (MUCINEX) 600 MG 12 hr tablet Take 1 tablet (600 mg total) by mouth 2 (two) times daily. 05/25/14  Yes Regalado, Belkys A, MD  guaiFENesin (ROBITUSSIN) 100 MG/5ML liquid Take 100 mg by mouth 2 (two) times daily as needed for cough or congestion. Reported on 12/08/2015   Yes [provider]  isosorbide dinitrate (ISORDIL) 30 MG tablet Take 0.5 tablets (15 mg total) by mouth 2 (two) times daily. 07/10/20  Yes Weaver, Scott T, PA-C  LINZESS 72 MCG capsule Take 72 mcg by mouth daily. 11/24/20  Yes [provider]  loratadine (CLARITIN) 10 MG tablet Take 10 mg by mouth daily.     Yes [provider]  metoprolol tartrate (LOPRESSOR) 25 MG tablet TAKE 1/2 TABLET BY MOUTH TWICE DAILY Patient taking differently: Take 12.5 mg by mouth 2 (two) times daily. 09/15/20  Yes Denita Lung, MD  nitroGLYCERIN (NITROSTAT) 0.4 MG SL tablet DISSOLVE 1 TABLET UNDER TONGUE EVERY 5 MINUTES AS NEEDED FOR CHEST PAIN Patient taking differently: Place 0.4 mg under the tongue every 5 (five) minutes as needed for chest pain. DISSOLVE 1  TABLET UNDER TONGUE EVERY 5 MINUTES AS NEEDED FOR CHEST PAIN 01/04/21  Yes Burnell Blanks, MD  pantoprazole (PROTONIX) 40 MG tablet TAKE 1 TABLET BY MOUTH TWICE DAILY 12/04/20  Yes Denita Lung, MD  rosuvastatin (CRESTOR) 40 MG tablet TAKE 1 TABLET BY MOUTH EVERY DAY 12/28/20  Yes Denita Lung, MD  VENTOLIN HFA 108 (90 Base) MCG/ACT inhaler INHALE 2 PUFFS BY MOUTH EVERY 6 HOURS AS NEEDED FOR WHEEZING OR SHORTNESS OF BREATH Patient taking differently: Inhale 2 puffs into the lungs every 6 (six) hours as needed for wheezing or shortness of breath. INHALE 2 PUFFS BY MOUTH EVERY 6 HOURS AS NEEDED FOR WHEEZING OR SHORTNESS OF BREATH 02/12/20  Yes Denita Lung, MD  polyethylene glycol powder (MIRALAX) 17 GM/SCOOP powder Take 17 g by mouth 2 (two) times daily as needed for moderate constipation. Patient not taking: No sig reported 04/27/20   Mercy Riding, MD  triamcinolone (KENALOG) 0.1 % Apply 1 application topically 2 (two) times daily. Patient not taking: No sig reported 05/21/20   Denita Lung, MD    Inpatient Medications: Scheduled Meds:  budesonide  0.5 mg Inhalation BID   Chlorhexidine Gluconate Cloth  6 each Topical Daily   cholecalciferol  1,000 Units Oral Daily   docusate sodium  100 mg Oral Daily   feeding supplement  237 mL Oral BID BM   fluticasone  2 spray Each Nare Daily   guaiFENesin  600 mg Oral BID   linaclotide  72 mcg Oral Q breakfast   loratadine  10 mg Oral Daily   pantoprazole  40 mg Oral BID   potassium chloride  40 mEq Oral Once   rosuvastatin  40 mg Oral Daily   Continuous Infusions:  DOPamine 10 mcg/kg/min (01/15/21 0600)   heparin 600 Units/hr (01/15/21 0600)   norepinephrine (LEVOPHED) Adult infusion     sodium chloride     PRN Meds: acetaminophen, albuterol, ALPRAZolam, diazepam, guaiFENesin, nitroGLYCERIN, ondansetron (ZOFRAN) IV, polyethylene glycol, zolpidem  Allergies:    Allergies  Allergen Reactions   Other Other (See Comments)    NO  ACIDIC, TART< OR SPICY FOODS- DEVELOPS REFLUX OFTEN!!  PATIENT HAS TROUBLE SWALLOWING TABLETS!!   Bactrim [Sulfamethoxazole-Trimethoprim] Other (See Comments)    "makes  her feel funny" or "unsteady"    Amitriptyline Hcl Rash   Aspirin Hives, Swelling, Rash and Other (See Comments)    Body became swollen   Tape Other (See Comments)    SKIN IS VERY THIN- Jaymie Misch TEAR EASILY!!   Zetia [Ezetimibe] Rash    Social History:   Social History   Socioeconomic History   Marital status: Widowed    Spouse name: Not on file   Number of children: 3   Years of education: Not on file   Highest education level: Not on file  Occupational History    Employer: RETIRED  Tobacco Use   Smoking status: Never   Smokeless tobacco: Former    Types: Snuff    Quit date: 05/23/2001  Vaping Use   Vaping Use: Never used  Substance and Sexual Activity   Alcohol use: No   Drug use: No   Sexual activity: Not Currently  Other Topics Concern   Not on file  Social History Narrative   Not on file   Social Determinants of Health   Financial Resource Strain: Not on file  Food Insecurity: Not on file  Transportation Needs: Not on file  Physical Activity: Not on file  Stress: Not on file  Social Connections: Not on file  Intimate Partner Violence: Not on file    Family History:   Family History  Problem Relation Age of Onset   Heart failure Mother    Other Father      ROS:  Please see the history of present illness.  All other ROS reviewed and negative.     Physical Exam/Data:   Vitals:   01/15/21 0530 01/15/21 0600 01/15/21 0747 01/15/21 0800  BP:  132/71    Pulse:      Resp: (!) 25 (!) 23    Temp:    98.8 F (37.1 C)  TempSrc:    Oral  SpO2: 97% 95% 95%   Weight:      Height:        Intake/Output Summary (Last 24 hours) at 01/15/2021 0824 Last data filed at 01/15/2021 0600 Gross per 24 hour  Intake 142.11 ml  Output 700 ml  Net -557.89 ml   Last 3 Weights 01/15/2021 01/14/2021  01/13/2021  Weight (lbs) 89 lb 11.6 oz 89 lb 15.2 oz 90 lb  Weight (kg) 40.7 kg 40.8 kg 40.824 kg     Body mass index is 16.41 kg/m.  General:  Well nourished, well developed, in no acute distress, she is cachectic  HEENT: normal Lymph: no adenopathy Neck: no JVD Endocrine:  No thryomegaly Vascular: No carotid bruits; FA pulses 2+ bilaterally without bruits  Cardiac:  irreg-irreg; no murmurs, gallops or rubs Lungs:  CTA b/l, no wheezing, rhonchi or rales  Abd: soft, nontender Ext: no edema Musculoskeletal:  No deformities, she has advanced atrophy even for her age Skin: warm and dry  Neuro: no focal abnormalities noted Psych:  Normal affect   EKG:  The EKG was personally reviewed and demonstrates:    AFib 52bpm, QRS 132m AFib 65bpm, QRS 88 Afib 82bpm, couplet  Telemetry:  Telemetry was personally reviewed and demonstrates:   AFib 60's-70, with intermittent evidence of external pacing Rare pausing 4 seconds since on dopamine   Relevant CV Studies:  02/26/20: TTE IMPRESSIONS   1. Left ventricular ejection fraction, by estimation, is 55 to 60%. The  left ventricle has normal function. The left ventricle has no regional  wall motion abnormalities. There is mild asymmetric  left ventricular  hypertrophy of the basal-septal segment.  Left ventricular diastolic function could not be evaluated.   2. Right ventricular systolic function is normal. The right ventricular  size is normal. There is mildly elevated pulmonary artery systolic  pressure. The estimated right ventricular systolic pressure is [id number] mmHg.   3. The mitral valve is degenerative. Mild mitral valve regurgitation. No  evidence of mitral stenosis.   4. 23 mm S3 TAVR. V max 2.1 m/s, MG 8.4 mmHG, AVA 1.88 cm2, DI 0.60. No  regurgitation or paravalvular leak. Normal prosthesis. The aortic valve  has been repaired/replaced. Aortic valve regurgitation is not visualized.  There is a 23 mm Sapien prosthetic  (TAVR) valve  present in the aortic position. Procedure Date: 01/03/2017.   5. The inferior vena cava is normal in size with greater than 50%  respiratory variability, suggesting right atrial pressure of 3 mmHg.   Comparison(s): No significant change from prior study. Stable EF. Stable  TAVR gradients.     Sept 2018, monitor Sinus bradycardia with lowest heart rate 48 bpm Premature ventricular contractions Premature atrial contractions Transient second degree AV block.    She Yuna Pizzolato need referral to EP.    10/27/2016: LHC Ost Cx to Prox Cx lesion, 50 %stenosed. Ost 2nd Mrg to 2nd Mrg lesion, 10 %stenosed. SVG graft was visualized by angiography and is normal in caliber. Mid RCA to Dist RCA lesion, 100 %stenosed. SVG graft was visualized by angiography and is normal in caliber. SVG graft was visualized by angiography and is normal in caliber. Mid LAD lesion, 100 %stenosed. 1st Diag lesion, 50 %stenosed. LIMA graft was visualized by non-selective angiography and is small. Dist LAD lesion, 40 %stenosed.   1. Severe triple vessel CAD s/p 4V CABG with 3/4 patent bypass grafts 2. The mid LAD is chronically occluded. The vein graft to the mid LAD is patent 3. The proximal Circumflex has moderate stenosis. The mid Circumflex stent is patent. The vein graft to the second OM is patent 4. The mid RCA is chronically occluded. The vein graft to the distal RCA is patent 5. The LIMA to the Diagonal is atretic. The Diagonal branch as moderate non-obstructive disease.  6. Severe aortic stenosis (mean gradient 41.5 mmHg, peak to peak gradient 41 mmHg, AVA 0.76cm2).    Recommendations: Adithi Gammon continue planning for TAVR. She Briseidy Spark be referred to see the CT surgeons on our TAVR team and Breeonna Mone need to have her pre op testing arranged    Laboratory Data:  High Sensitivity Troponin:   Recent Labs  Lab 01/14/21 1643 01/14/21 1843  TROPONINIHS 16 19*     Chemistry Recent Labs  Lab 01/13/21 2142 01/14/21 1643  01/15/21 0607  NA 136 140 139  K 3.6 4.0 2.8*  CL 99 104 102  CO2 '28 25 25  '$ GLUCOSE 133* 153* 114*  BUN '9 8 8  '$ CREATININE 0.53 0.54 0.51  CALCIUM 9.5 9.6 9.8  GFRNONAA >60 >60 >60  ANIONGAP '9 11 12    '$ Recent Labs  Lab 01/15/21 0607  PROT 7.1  ALBUMIN 4.0  AST 18  ALT 15  ALKPHOS 41  BILITOT 1.1   Hematology Recent Labs  Lab 01/13/21 2142 01/14/21 1643 01/14/21 2040  WBC 4.5 6.4 8.5  RBC 3.95 4.01 4.29  4.25  HGB 12.1 11.7* 12.8  HCT 36.3 37.4 38.9  MCV 91.9 93.3 90.7  MCH 30.6 29.2 29.8  MCHC 33.3 31.3 32.9  RDW 13.4 13.3 13.4  PLT 186 187  180   BNPNo results for input(s): BNP, PROBNP in the last 168 hours.  DDimer No results for input(s): DDIMER in the last 168 hours.   Radiology/Studies:  DG Chest Port 1 View  Result Date: 01/14/2021 CLINICAL DATA:  Atrial fibrillation and syncope EXAM: PORTABLE CHEST 1 VIEW COMPARISON:  01/13/2021 FINDINGS: Numerous leads and wires project over the chest. Prior median sternotomy. CABG. Midline trachea. Normal heart size. Atherosclerosis in the transverse aorta. Possible trace right pleural fluid. Right costophrenic angle partially excluded. No pneumothorax. No congestive failure. Right lung base calcified granuloma. No lobar consolidation. IMPRESSION: No acute cardiopulmonary disease. Aortic Atherosclerosis (ICD10-I70.0). Possible trace right pleural fluid. Electronically Signed   By: Abigail Miyamoto M.D.   On: 01/14/2021 17:42   DG Chest Port 1 View  Result Date: 01/13/2021 CLINICAL DATA:  Headache and nausea this morning. Anxious and short of breath throughout the day. EXAM: PORTABLE CHEST 1 VIEW COMPARISON:  04/23/2020 FINDINGS: Postoperative changes in the mediastinum. Normal heart size and pulmonary vascularity. Cardiac valve prosthesis. Emphysematous changes in the lungs. Fibrosis in the upper lungs. Peribronchial thickening suggesting bronchitic change. Calcified granuloma in the right base. Mild blunting of the right  costophrenic angle is unchanged since prior study, likely pleural thickening. Calcification of the aorta. Thoracolumbar scoliosis with degenerative change. IMPRESSION: Emphysematous and chronic bronchitic changes in the lungs. Fibrosis in the apices. Right pleural thickening. No active pulmonary disease. Electronically Signed   By: Lucienne Capers M.D.   On: 01/13/2021 21:55   [place] EKG SITE RITE  Result Date: 01/15/2021 If Site Rite image not attached, placement could not be confirmed due to current cardiac rhythm.    Assessment and Plan:   Syncope 2/2 long pauses, as long as 10 seconds in the ER Last 2 weeks with unusual SOB and dizzy spells  Last dose of metoprolol (12.'5mg'$  BID) was yesterday 1300  This AM despite dopamine she was persistently hypotensive w/SBPs 80's-90, with adequate HR and changed to levophed with improvement in her BP 100's-110s In d/w attending cardiologist, he suspect her hypotension is marked volume depletion and malnutrition and has started IVF as well. He has no suspicion of an acute coronary event though echo is ordered and at bedside.  I suspect she Katelan Hirt need pacing, she was pn a tiny dose of lopressor I discussed possible pacer with the patient and her daughter at bedside and realistic expectations from a big picture standpoint as to what the pacer potential Lavell Supple help with. We discussed the procedure and potential risks as well. They are both agreeable to proceed   Dr. Curt Bears Brittny Spangle see her later this AM once out of procedure.   2.  AFib is described as paroxysmal EKGs reviewed and has been SR until dec 2021 by EKGs CHA2DS2Vasc is 7,  on Eliquis out patient >> heparin gtt here I held heparin gtt for possible pacing this afternoon    Dr. Curt Bears has seen and examined the patient While we were observing the patient on telemetry to despite levophed has some appropriate external pacing at 30. She Odette Watanabe need pacing support He has discussed with the patient  and her daughter, they remain agreeable Nitya Cauthon plan for this afternoon Hopefully post pacing Frankey Botting be able to get pressor off Hold off for now picc line Increase fluids   Risk Assessment/Risk Scores:     For questions or updates, please contact [place] Please consult [email or web address] for contact info under    Signed, Baldwin Jamaica, PA-C  01/15/2021  8:24 AM  I have seen and examined this patient with Tommye Standard.  Agree with above, note added to reflect my findings.  On exam, irregular, no murmurs. Presented with syncope and AF. Having signfiicant pauses requirng intermittent temporary pacing. Metoprolol has washed out. Jaidyn Usery plan for PPM today.  [name] has presented today for surgery, with the diagnosis of atrial fibrillation with pauses.  The various methods of treatment have been discussed with the patient and family. After consideration of risks, benefits and other options for treatment, the patient has consented to  Procedure(s): Pacemaker implant as a surgical intervention .  Risks include but not limited to bleeding, infection, pneumothorax, perforation, tamponade, vascular damage, renal failure, MI, stroke, death, and lead dislodgement . The patient's history has been reviewed, patient examined, no change in status, stable for surgery.  I have reviewed the patient's chart and labs.  Questions were answered to the patient's satisfaction.    Maralee Higuchi Curt Bears, MD 01/15/2021 11:49 AM

## 2021-01-15 NOTE — Progress Notes (Signed)
On assessment at 0400 RN saw infiltration on right AC IV infusing Dopamine at 4mg.  Notable change since assessment previous hour.  Site appeared purple and swollen, patient had no complaints of pain and was approximately 4 inches and a level 4.  RN initiated Adult vasopressor extravasation protocol and followed.  Following medication, site appears to be improving less swollen, still some discoloration.  Patient has no complaints of pain.  RMancel BaleNP has been notified and will continue to assess.

## 2021-01-15 NOTE — H&P (View-Only) (Signed)
Cardiology Consultation:   Patient ID: [name] MRN: [id number]; DOB: August 04, 1928  Admit date: 01/14/2021 Date of Consult: 01/15/2021  PCP:  Lori Lung, MD   [facility] Providers Cardiologist:  Lori Chandler, MD EP: Dr. Curt [name]  Patient Profile:   [name] is a 87 y.o. female with a hx of CAD (CABG and MAZE 2006), AFib, prior stroke, PVD (R CEA ), VHD severe AS > TAVR (2018), HTN, HLD,  who is being seen 01/15/2021 for the evaluation of syncope and ppauses at the request of [name].  History of Present Illness:   [name] was referred to Dr. Curt [name] back in 2018 post TAVR developed new LBBB and abnormal monitor with pauses, and 2nd degree AVblock (?).  Dr. Curt [name] discussed her Cardiac monitor showed heart rate of 48 at the lowest. It is unclear as to whether or not her symptoms are due to slow heart rates. Based on the monitor, she is having episodes of sinus pauses lasting up to second and a half. planned to stop diltiazem and start 12.5 mg of metoprolol.  She has been followed by cardiology out patient since then, last was feb this year a virtual visit, had been hospitalized dec 2021 for LGIB (suspect diverticular), had done well since, occ symptoms of angina off imdur, no symptoms of bradycardia, no reports of syncope.  She was admitted to [facility] yesterday after c/o some degree of SOB and a syncopal episode while eating breakfast, in the ER she was observed to have AFib with pauses as long as 6 seconds.  Pt and family report poor appetite of late with reduced oral intake/anorexia. Current weight 40.7kg  She was admitted, pacer pad placed, started on dopamine, and EP consulted by attending cardiologist who also notes concerns of significant malnutrition and failure to thrive of late, though with discussions with pt and family, continued full code at this time Her home small dose of BB was held  LABS K+ 3.6 > 4.0 >>>2.8 replacement ordered  already BUN/Creat 9/0.53 >>  8/0.51 HS Trop 16 > 19  WBC 4.5 >>> 8.5 H/H 12/36 >>> 12/38 Plts 187 >> 180 TSH 2.243   Home Meds include metoprolol tart 12.'5mg'$  BID Last dose was yesterday about 1300 No other noted nodal blocking agents  Her dopamine was changed to levophed this AM 2/2 persistent lower BPds currently at 2  The patient since Dec hospitalization has had marked functional decline having been ambulatory (minimally) with a walker to essentially non-ambulatory, they report 2/2 legs are weak and she has poor balance. Appetite has also steadily declined this a longer timeframe, probably a year or so, though worse in the last 21moor so with persistent nausea and early satiety. [name] estimates a year ago she weighed 95lbs, current weight is 89-90lbs  In the last 2 weeks she has been having episodic SOB, feeling of needing more air and dizzy spells. Yesterday the 1st time she fainted.  She is currently intermittently externally paced (set at 40) though often is inappropriate, some despite pressor is appropriate as well, thankfully well tolerated. I have asked back up pacing be set to 30   Past Medical History:  Diagnosis Date   Anxiety    Aortic Stenosis s/p TAVR    Echo 10/21: EF 55-60, no RWMA, mild asymmetric LVH, normal RVSF, RVSP 40.2 mmHg, mild MR, s/p TAVR with mean gradient 8.455mg, no PVL   Arthritis    OSTEO   Aspirin allergy    on  Plavix   Asthma    Bronchitis    CAD (coronary artery disease)    a. s/p CABG 2006 with Cox-Maze procedure.   Carotid artery disease ([place])    a. s/p R CEA.   Chronic stable angina (HCC)    Diastolic dysfunction    Eczema    Fibromyalgia    GERD (gastroesophageal reflux disease)    Heart murmur    Hiatal hernia    Hyperlipidemia    Hypertension    Kyphoscoliosis    Mitral regurgitation    Myocardial infarction [facility]) 2003   Stent to CFX   PAF (paroxysmal atrial fibrillation) ([place])    a. pt has h/o hematuria on Eliquis and  has since refused anticoagulation   Pneumonia 2015 ?   Pulmonary regurgitation    Skin cancer of arm    Stroke [facility])    Tricuspid regurgitation     Past Surgical History:  Procedure Laterality Date   ABDOMINAL HYSTERECTOMY  1976   CAROTID ENDARTERECTOMY  2010   CORONARY ANGIOPLASTY WITH STENT PLACEMENT     CORONARY ARTERY BYPASS GRAFT  01/2005   LIMA-D1; SVG-LAD; SVG-OM; SVG-PDA   EYE SURGERY     MULTIPLE EXTRACTIONS WITH ALVEOLOPLASTY  12/28/2016   Extraction of tooth #'s 2- 5,7-10, [id number] 22-29 with alveoloplasty and maxillary right and left lateral exostoses reductions   MULTIPLE EXTRACTIONS WITH ALVEOLOPLASTY N/A 12/28/2016   Procedure: Extraction of tooth #'s 2- 5,7-10, 12,13,17-20,and 22-29 with alveoloplasty and maxillary right and left lateral exostoses reductions;  Surgeon: Lenn Cal, DDS;  Location: [place];  Service: Oral Surgery;  Laterality: N/A;   RIGHT/LEFT HEART CATH AND CORONARY/GRAFT ANGIOGRAPHY N/A 10/27/2016   Procedure: Right/Left Heart Cath and Coronary/Graft Angiography;  Surgeon: Burnell Blanks, MD;  Location: [place] CV LAB;  Service: Cardiovascular;  Laterality: N/A;   TEE WITHOUT CARDIOVERSION N/A 01/03/2017   Procedure: TRANSESOPHAGEAL ECHOCARDIOGRAM (TEE);  Surgeon: Burnell Blanks, MD;  Location: [place];  Service: Open Heart Surgery;  Laterality: N/A;   TONSILLECTOMY     TRANSCATHETER AORTIC VALVE REPLACEMENT, TRANSFEMORAL N/A 01/03/2017   Procedure: TRANSCATHETER AORTIC VALVE REPLACEMENT, TRANSFEMORAL;  Surgeon: Burnell Blanks, MD;  Location: [place];  Service: Open Heart Surgery;  Laterality: N/A;   TUMOR REMOVAL       Home Medications:  Prior to Admission medications   Medication Sig Start Date End Date Taking? Authorizing Provider  acetaminophen (TYLENOL) 650 MG CR tablet Take 1,300 mg by mouth 2 (two) times daily as needed for pain.    Yes [provider]  Cholecalciferol (VITAMIN D-3) 25 MCG (1000 UT)  CAPS Take 1,000 Units by mouth daily.   Yes [provider]  diazepam (VALIUM) 2 MG tablet Take 1 tablet (2 mg total) by mouth every 6 (six) hours as needed for anxiety. 11/02/18  Yes Lori Lung, MD  docusate sodium (COLACE) 100 MG capsule Take 1 capsule (100 mg total) by mouth daily. 04/27/20  Yes Gonfa, Charlesetta Ivory, MD  ELIQUIS 2.5 MG TABS tablet TAKE 1 TABLET(2.5 MG) BY MOUTH TWICE DAILY Patient taking differently: Take 2.5 mg by mouth 2 (two) times daily. 06/24/20  Yes Burnell Blanks, MD  Ensure Plus (ENSURE PLUS) LIQD Take 237 mLs by mouth 2 (two) times daily between meals.   Yes [provider]  FLOVENT HFA 220 MCG/ACT inhaler INHALE 2 PUFFS INTO THE LUNGS TWICE DAILY 11/18/19  Yes Lori Lung, MD  fluticasone (FLONASE) 50 MCG/ACT nasal spray SHAKE  LIQUID AND USE 2 SPRAYS IN EACH NOSTRIL EVERY DAY 09/01/20  Yes Lori Lung, MD  guaiFENesin (MUCINEX) 600 MG 12 hr tablet Take 1 tablet (600 mg total) by mouth 2 (two) times daily. 05/25/14  Yes Regalado, Belkys A, MD  guaiFENesin (ROBITUSSIN) 100 MG/5ML liquid Take 100 mg by mouth 2 (two) times daily as needed for cough or congestion. Reported on 12/08/2015   Yes [provider]  isosorbide dinitrate (ISORDIL) 30 MG tablet Take 0.5 tablets (15 mg total) by mouth 2 (two) times daily. 07/10/20  Yes Weaver, Scott T, PA-C  LINZESS 72 MCG capsule Take 72 mcg by mouth daily. 11/24/20  Yes [provider]  loratadine (CLARITIN) 10 MG tablet Take 10 mg by mouth daily.     Yes [provider]  metoprolol tartrate (LOPRESSOR) 25 MG tablet TAKE 1/2 TABLET BY MOUTH TWICE DAILY Patient taking differently: Take 12.5 mg by mouth 2 (two) times daily. 09/15/20  Yes Lori Lung, MD  nitroGLYCERIN (NITROSTAT) 0.4 MG SL tablet DISSOLVE 1 TABLET UNDER TONGUE EVERY 5 MINUTES AS NEEDED FOR CHEST PAIN Patient taking differently: Place 0.4 mg under the tongue every 5 (five) minutes as needed for chest pain. DISSOLVE 1  TABLET UNDER TONGUE EVERY 5 MINUTES AS NEEDED FOR CHEST PAIN 01/04/21  Yes Burnell Blanks, MD  pantoprazole (PROTONIX) 40 MG tablet TAKE 1 TABLET BY MOUTH TWICE DAILY 12/04/20  Yes Lori Lung, MD  rosuvastatin (CRESTOR) 40 MG tablet TAKE 1 TABLET BY MOUTH EVERY DAY 12/28/20  Yes Lori Lung, MD  VENTOLIN HFA 108 (90 Base) MCG/ACT inhaler INHALE 2 PUFFS BY MOUTH EVERY 6 HOURS AS NEEDED FOR WHEEZING OR SHORTNESS OF BREATH Patient taking differently: Inhale 2 puffs into the lungs every 6 (six) hours as needed for wheezing or shortness of breath. INHALE 2 PUFFS BY MOUTH EVERY 6 HOURS AS NEEDED FOR WHEEZING OR SHORTNESS OF BREATH 02/12/20  Yes Lori Lung, MD  polyethylene glycol powder (MIRALAX) 17 GM/SCOOP powder Take 17 g by mouth 2 (two) times daily as needed for moderate constipation. Patient not taking: No sig reported 04/27/20   Mercy Riding, MD  triamcinolone (KENALOG) 0.1 % Apply 1 application topically 2 (two) times daily. Patient not taking: No sig reported 05/21/20   Lori Lung, MD    Inpatient Medications: Scheduled Meds:  budesonide  0.5 mg Inhalation BID   Chlorhexidine Gluconate Cloth  6 each Topical Daily   cholecalciferol  1,000 Units Oral Daily   docusate sodium  100 mg Oral Daily   feeding supplement  237 mL Oral BID BM   fluticasone  2 spray Each Nare Daily   guaiFENesin  600 mg Oral BID   linaclotide  72 mcg Oral Q breakfast   loratadine  10 mg Oral Daily   pantoprazole  40 mg Oral BID   potassium chloride  40 mEq Oral Once   rosuvastatin  40 mg Oral Daily   Continuous Infusions:  DOPamine 10 mcg/kg/min (01/15/21 0600)   heparin 600 Units/hr (01/15/21 0600)   norepinephrine (LEVOPHED) Adult infusion     sodium chloride     PRN Meds: acetaminophen, albuterol, ALPRAZolam, diazepam, guaiFENesin, nitroGLYCERIN, ondansetron (ZOFRAN) IV, polyethylene glycol, zolpidem  Allergies:    Allergies  Allergen Reactions   Other Other (See Comments)    NO  ACIDIC, TART< OR SPICY FOODS- DEVELOPS REFLUX OFTEN!!  PATIENT HAS TROUBLE SWALLOWING TABLETS!!   Bactrim [Sulfamethoxazole-Trimethoprim] Other (See Comments)    "makes  her feel funny" or "unsteady"    Amitriptyline Hcl Rash   Aspirin Hives, Swelling, Rash and Other (See Comments)    Body became swollen   Tape Other (See Comments)    SKIN IS VERY THIN- Zyion Leidner TEAR EASILY!!   Zetia [Ezetimibe] Rash    Social History:   Social History   Socioeconomic History   Marital status: Widowed    Spouse name: Not on file   Number of children: 3   Years of education: Not on file   Highest education level: Not on file  Occupational History    Employer: RETIRED  Tobacco Use   Smoking status: Never   Smokeless tobacco: Former    Types: Snuff    Quit date: 05/23/2001  Vaping Use   Vaping Use: Never used  Substance and Sexual Activity   Alcohol use: No   Drug use: No   Sexual activity: Not Currently  Other Topics Concern   Not on file  Social History Narrative   Not on file   Social Determinants of Health   Financial Resource Strain: Not on file  Food Insecurity: Not on file  Transportation Needs: Not on file  Physical Activity: Not on file  Stress: Not on file  Social Connections: Not on file  Intimate Partner Violence: Not on file    Family History:   Family History  Problem Relation Age of Onset   Heart failure Mother    Other Father      ROS:  Please see the history of present illness.  All other ROS reviewed and negative.     Physical Exam/Data:   Vitals:   01/15/21 0530 01/15/21 0600 01/15/21 0747 01/15/21 0800  BP:  132/71    Pulse:      Resp: (!) 25 (!) 23    Temp:    98.8 F (37.1 C)  TempSrc:    Oral  SpO2: 97% 95% 95%   Weight:      Height:        Intake/Output Summary (Last 24 hours) at 01/15/2021 0824 Last data filed at 01/15/2021 0600 Gross per 24 hour  Intake 142.11 ml  Output 700 ml  Net -557.89 ml   Last 3 Weights 01/15/2021 01/14/2021  01/13/2021  Weight (lbs) 89 lb 11.6 oz 89 lb 15.2 oz 90 lb  Weight (kg) 40.7 kg 40.8 kg 40.824 kg     Body mass index is 16.41 kg/m.  General:  Well nourished, well developed, in no acute distress, she is cachectic  HEENT: normal Lymph: no adenopathy Neck: no JVD Endocrine:  No thryomegaly Vascular: No carotid bruits; FA pulses 2+ bilaterally without bruits  Cardiac:  irreg-irreg; no murmurs, gallops or rubs Lungs:  CTA b/l, no wheezing, rhonchi or rales  Abd: soft, nontender Ext: no edema Musculoskeletal:  No deformities, she has advanced atrophy even for her age Skin: warm and dry  Neuro: no focal abnormalities noted Psych:  Normal affect   EKG:  The EKG was personally reviewed and demonstrates:    AFib 52bpm, QRS 170m AFib 65bpm, QRS 88 Afib 82bpm, couplet  Telemetry:  Telemetry was personally reviewed and demonstrates:   AFib 60's-70, with intermittent evidence of external pacing Rare pausing 4 seconds since on dopamine   Relevant CV Studies:  02/26/20: TTE IMPRESSIONS   1. Left ventricular ejection fraction, by estimation, is 55 to 60%. The  left ventricle has normal function. The left ventricle has no regional  wall motion abnormalities. There is mild asymmetric  left ventricular  hypertrophy of the basal-septal segment.  Left ventricular diastolic function could not be evaluated.   2. Right ventricular systolic function is normal. The right ventricular  size is normal. There is mildly elevated pulmonary artery systolic  pressure. The estimated right ventricular systolic pressure is [id number] mmHg.   3. The mitral valve is degenerative. Mild mitral valve regurgitation. No  evidence of mitral stenosis.   4. 23 mm S3 TAVR. V max 2.1 m/s, MG 8.4 mmHG, AVA 1.88 cm2, DI 0.60. No  regurgitation or paravalvular leak. Normal prosthesis. The aortic valve  has been repaired/replaced. Aortic valve regurgitation is not visualized.  There is a 23 mm Sapien prosthetic  (TAVR) valve  present in the aortic position. Procedure Date: 01/03/2017.   5. The inferior vena cava is normal in size with greater than 50%  respiratory variability, suggesting right atrial pressure of 3 mmHg.   Comparison(s): No significant change from prior study. Stable EF. Stable  TAVR gradients.     Sept 2018, monitor Sinus bradycardia with lowest heart rate 48 bpm Premature ventricular contractions Premature atrial contractions Transient second degree AV block.    She [place] need referral to EP.    10/27/2016: LHC Ost Cx to Prox Cx lesion, 50 %stenosed. Ost 2nd Mrg to 2nd Mrg lesion, 10 %stenosed. SVG graft was visualized by angiography and is normal in caliber. Mid RCA to Dist RCA lesion, 100 %stenosed. SVG graft was visualized by angiography and is normal in caliber. SVG graft was visualized by angiography and is normal in caliber. Mid LAD lesion, 100 %stenosed. 1st Diag lesion, 50 %stenosed. LIMA graft was visualized by non-selective angiography and is small. Dist LAD lesion, 40 %stenosed.   1. Severe triple vessel CAD s/p 4V CABG with 3/4 patent bypass grafts 2. The mid LAD is chronically occluded. The vein graft to the mid LAD is patent 3. The proximal Circumflex has moderate stenosis. The mid Circumflex stent is patent. The vein graft to the second OM is patent 4. The mid RCA is chronically occluded. The vein graft to the distal RCA is patent 5. The LIMA to the Diagonal is atretic. The Diagonal branch as moderate non-obstructive disease.  6. Severe aortic stenosis (mean gradient 41.5 mmHg, peak to peak gradient 41 mmHg, AVA 0.76cm2).    Recommendations: Jacarri Gesner continue planning for TAVR. She Samamtha Tiegs be referred to see the CT surgeons on our TAVR team and Teia Freitas need to have her pre op testing arranged    Laboratory Data:  High Sensitivity Troponin:   Recent Labs  Lab 01/14/21 1643 01/14/21 1843  TROPONINIHS 16 19*     Chemistry Recent Labs  Lab 01/13/21 2142 01/14/21 1643  01/15/21 0607  NA 136 140 139  K 3.6 4.0 2.8*  CL 99 104 102  CO2 '28 25 25  '$ GLUCOSE 133* 153* 114*  BUN '9 8 8  '$ CREATININE 0.53 0.54 0.51  CALCIUM 9.5 9.6 9.8  GFRNONAA >60 >60 >60  ANIONGAP '9 11 12    '$ Recent Labs  Lab 01/15/21 0607  PROT 7.1  ALBUMIN 4.0  AST 18  ALT 15  ALKPHOS 41  BILITOT 1.1   Hematology Recent Labs  Lab 01/13/21 2142 01/14/21 1643 01/14/21 2040  WBC 4.5 6.4 8.5  RBC 3.95 4.01 4.29  4.25  HGB 12.1 11.7* 12.8  HCT 36.3 37.4 38.9  MCV 91.9 93.3 90.7  MCH 30.6 29.2 29.8  MCHC 33.3 31.3 32.9  RDW 13.4 13.3 13.4  PLT 186 187  180   BNPNo results for input(s): BNP, PROBNP in the last 168 hours.  DDimer No results for input(s): DDIMER in the last 168 hours.   Radiology/Studies:  DG Chest Port 1 View  Result Date: 01/14/2021 CLINICAL DATA:  Atrial fibrillation and syncope EXAM: PORTABLE CHEST 1 VIEW COMPARISON:  01/13/2021 FINDINGS: Numerous leads and wires project over the chest. Prior median sternotomy. CABG. Midline trachea. Normal heart size. Atherosclerosis in the transverse aorta. Possible trace right pleural fluid. Right costophrenic angle partially excluded. No pneumothorax. No congestive failure. Right [name] base calcified granuloma. No lobar consolidation. IMPRESSION: No acute cardiopulmonary disease. Aortic Atherosclerosis (ICD10-I70.0). Possible trace right pleural fluid. Electronically Signed   By: Abigail Miyamoto M.D.   On: 01/14/2021 17:42   DG Chest Port 1 View  Result Date: 01/13/2021 CLINICAL DATA:  Headache and nausea this morning. Anxious and short of breath throughout the day. EXAM: PORTABLE CHEST 1 VIEW COMPARISON:  04/23/2020 FINDINGS: Postoperative changes in the mediastinum. Normal heart size and pulmonary vascularity. Cardiac valve prosthesis. Emphysematous changes in the lungs. Fibrosis in the upper lungs. Peribronchial thickening suggesting bronchitic change. Calcified granuloma in the right base. Mild blunting of the right  costophrenic angle is unchanged since prior study, likely pleural thickening. Calcification of the aorta. Thoracolumbar scoliosis with degenerative change. IMPRESSION: Emphysematous and chronic bronchitic changes in the lungs. Fibrosis in the apices. Right pleural thickening. No active pulmonary disease. Electronically Signed   By: Lucienne Capers M.D.   On: 01/13/2021 21:55   [place] EKG SITE RITE  Result Date: 01/15/2021 If Site Rite image not attached, placement could not be confirmed due to current cardiac rhythm.    Assessment and Plan:   Syncope 2/2 long pauses, as long as 10 seconds in the ER Last 2 weeks with unusual SOB and dizzy spells  Last dose of metoprolol (12.'5mg'$  BID) was yesterday 1300  This AM despite dopamine she was persistently hypotensive w/SBPs 80's-90, with adequate HR and changed to levophed with improvement in her BP 100's-110s In d/w attending cardiologist, he suspect her hypotension is marked volume depletion and malnutrition and has started IVF as well. He has no suspicion of an acute coronary event though echo is ordered and at bedside.  I suspect she Jeryn Bertoni need pacing, she was pn a tiny dose of lopressor I discussed possible pacer with the patient and her daughter at bedside and realistic expectations from a big picture standpoint as to what the pacer potential Liahna Brickner help with. We discussed the procedure and potential risks as well. They are both agreeable to proceed   Dr. Curt [name] Sofie Schendel see her later this AM once out of procedure.   2.  AFib is described as paroxysmal EKGs reviewed and has been SR until dec 2021 by EKGs CHA2DS2Vasc is 7,  on Eliquis out patient >> heparin gtt here I held heparin gtt for possible pacing this afternoon    Dr. Curt [name] has seen and examined the patient While we were observing the patient on telemetry to despite levophed has some appropriate external pacing at 30. She Seriah Brotzman need pacing support He has discussed with the patient  and her daughter, they remain agreeable Sonnet Rizor plan for this afternoon Hopefully post pacing Ava Deguire be able to get pressor off Hold off for now picc line Increase fluids   Risk Assessment/Risk Scores:     For questions or updates, please contact [place] Please consult [email or web address] for contact info under    Signed, Baldwin Jamaica, PA-C  01/15/2021  8:24 AM  I have seen and examined this patient with Tommye Standard.  Agree with above, note added to reflect my findings.  On exam, irregular, no murmurs. Presented with syncope and AF. Having signfiicant pauses requirng intermittent temporary pacing. Metoprolol has washed out. Jihaad Bruschi plan for PPM today.  [name] has presented today for surgery, with the diagnosis of atrial fibrillation with pauses.  The various methods of treatment have been discussed with the patient and family. After consideration of risks, benefits and other options for treatment, the patient has consented to  Procedure(s): Pacemaker implant as a surgical intervention .  Risks include but not limited to bleeding, infection, pneumothorax, perforation, tamponade, vascular damage, renal failure, MI, stroke, death, and lead dislodgement . The patient's history has been reviewed, patient examined, no change in status, stable for surgery.  I have reviewed the patient's chart and labs.  Questions were answered to the patient's satisfaction.    Curtisha Bendix Lori Bears, MD 01/15/2021 11:49 AM

## 2021-01-15 NOTE — Discharge Instructions (Signed)
    Supplemental Discharge Instructions for  Pacemaker/Defibrillator Patients    Activity No heavy lifting or vigorous activity with your left/right arm for 6 to 8 weeks.  Do not raise your left/right arm above your head for one week.  Gradually raise your affected arm as drawn below.              01/20/21                     01/21/21                     01/22/21                   01/23/21 __  NO DRIVING until cleared to at your wound check visit  [place] the wound area clean and dry.  Do not get this area wet , no showers until cleared to at your wound check visit  . The tape/steri-strips on your wound will fall off; do not pull them off.  No bandage is needed on the site.  DO  NOT apply any creams, oils, or ointments to the wound area. If you notice any drainage or discharge from the wound, any swelling or bruising at the site, or you develop a fever > 101? F after you are discharged home, call the office at once.  Special Instructions You are still able to use cellular telephones; use the ear opposite the side where you have your pacemaker/defibrillator.  Avoid carrying your cellular phone near your device. When traveling through airports, show security personnel your identification card to avoid being screened in the metal detectors.  Ask the security personnel to use the hand wand. Avoid arc welding equipment, MRI testing (magnetic resonance imaging), TENS units (transcutaneous nerve stimulators).  Call the office for questions about other devices. Avoid electrical appliances that are in poor condition or are not properly grounded. Microwave ovens are safe to be near or to operate.

## 2021-01-15 NOTE — Progress Notes (Signed)
ANTICOAGULATION CONSULT NOTE Pharmacy Consult for Heparin Indication: atrial fibrillation  Allergies  Allergen Reactions   Other Other (See Comments)    NO ACIDIC, TART< OR SPICY FOODS- DEVELOPS REFLUX OFTEN!!  PATIENT HAS TROUBLE SWALLOWING TABLETS!!   Bactrim [Sulfamethoxazole-Trimethoprim] Other (See Comments)    "makes her feel funny" or "unsteady"    Amitriptyline Hcl Rash   Aspirin Hives, Swelling, Rash and Other (See Comments)    Body became swollen   Tape Other (See Comments)    SKIN IS VERY THIN- WILL TEAR EASILY!!   Zetia [Ezetimibe] Rash    Patient Measurements: Height: '5\' 2"'$  (157.5 cm) Weight: 40.7 kg (89 lb 11.6 oz) IBW/kg (Calculated) : 50.1 Heparin Dosing Weight: 40.8 kg  Vital Signs: Temp: 98.9 F (37.2 C) (08/26 0300) Temp Source: Oral (08/26 0300) BP: 132/71 (08/26 0600) Pulse Rate: 81 (08/25 1915)  Labs: Recent Labs    01/13/21 2142 01/14/21 1643 01/14/21 1843 01/14/21 2023 01/14/21 2040 01/15/21 0607  HGB 12.1 11.7*  --   --  12.8  --   HCT 36.3 37.4  --   --  38.9  --   PLT 186 187  --   --  180  --   APTT  --   --   --  38*  --  67*  LABPROT  --   --   --  15.8*  --   --   INR  --   --   --  1.3*  --   --   HEPARINUNFRC  --   --   --  >1.10*  --  >1.10*  CREATININE 0.53 0.54  --   --   --   --   TROPONINIHS  --  16 19*  --   --   --      Estimated Creatinine Clearance: 30.6 mL/min (by C-G formula based on SCr of 0.54 mg/dL).  Assessment: 87 y.o. female with h/o Afib, Eliquis on hold, for heparin  Goal of Therapy:  Heparin level 0.3-0.7 units/ml aPTT 66-102 seconds Monitor platelets by anticoagulation protocol: Yes   Plan:  Continue Heparin at current rate   Phillis Knack, PharmD, BCPS

## 2021-01-15 NOTE — Progress Notes (Signed)
Initial Nutrition Assessment  DOCUMENTATION CODES:   Underweight, Severe malnutrition in context of social or environmental circumstances  INTERVENTION:   Recommend SLP evaluation prior to diet advancement  Once diet advanced:  Ensure Enlive po BID, each supplement provides 350 kcal and 20 grams of protein Magic cup TID with meals, each supplement provides 290 kcal and 9 grams of protein MVI with minerals daily   Recommend supplementing thiamine 100 mg x 7 days given severe malnutrition  Check vitamin and minerals labs- thiamine, folic acid, Vitamin C, zinc, and Vitamin D  NUTRITION DIAGNOSIS:   Severe Malnutrition related to social / environmental circumstances as evidenced by severe fat depletion, severe muscle depletion, energy intake < or equal to 50% for > or equal to 1 month.  MONITORING:  PO intake, Supplement acceptance, Weight trends, Labs, I & O's, Diet advancement  REASON FOR ASSESSMENT:   Consult Assessment of nutrition requirement/status  ASSESSMENT:   Patient with PMH significant for GIB 04/2020, CAD s/p CABG with MAZE 2006, CVA, R-CEA, AVR 2018, HTN, HLD, 2nd degree AV block, LBBB, and stroke. Presents this admission with afib, slow VR.  Going for pacer today. NPO for procedure.   Patient endorses decreased intake over the last few months due to loss in appetite and nausea. States during this time she consumed two meals daily that consisted of B-egg beaters, sausage links and D- pork chops with vegetables. All meals are prepared by he daughter. Majority of the time patient states she does not like the way meals are prepared. Recently bought a case of Ensure but consumed them inconsistently. Has minimal teeth on the bottom row. Has dentures but does not wear them due to pain upon eating. Endorses swallowing issues with tough pieces of meat and breads. Recommend SLP evaluation prior to advancing diet.   MD notes patient states she is "the poorest patient" hospital  will ever have leaving concern for food insecurity. RD questioned patient and daughter regarding resources to purchase and store food. Daughter reports patient receives disability and she receives SNAP benefits (~$200 per month). They deny issues with being able to afford meals. RD offered to provide resource for [employer] on Wheels. Patient and daughter both hesitant as their neighbor received this benefit and did not enjoy the food. Social work made aware.   Patient endorses a UBW of 138 lb, last time being at that weight 2-3 years ago. Records indicate patient weighed 101 lb on 11/3 and 89 lb this admission (11.9% wt loss in 9 months, insignificant for time frame).   UOP: 700 ml x 24 hrs   Drips: dopamine, levophed Medications: colace, vitamin D Labs: K 2.8 (L) CBG 114-153  NUTRITION - FOCUSED PHYSICAL EXAM:  Flowsheet Row Most Recent Value  Orbital Region Severe depletion  Upper Arm Region Severe depletion  Thoracic and Lumbar Region Severe depletion  Buccal Region Severe depletion  Temple Region Severe depletion  Clavicle Bone Region Severe depletion  Clavicle and Acromion Bone Region Severe depletion  Scapular Bone Region Severe depletion  Dorsal Hand Severe depletion  Patellar Region Severe depletion  Anterior Thigh Region Severe depletion  Posterior Calf Region Severe depletion  Edema (RD Assessment) Mild  Hair Reviewed  [thin]  Eyes Reviewed  Mouth Reviewed  Skin Reviewed  [brusing on arms]  Nails Reviewed      Diet Order:   Diet Order             Diet NPO time specified Except for: Sips with Meds  Diet effective now                   EDUCATION NEEDS:   Education needs have been addressed  Skin:  Skin Assessment: Reviewed RN Assessment  Last BM:  8/25  Height:   Ht Readings from Last 1 Encounters:  01/14/21 '5\' 2"'$  (1.575 m)    Weight:   Wt Readings from Last 1 Encounters:  01/15/21 40.7 kg    BMI:  Body mass index is 16.41 kg/m.  Estimated  Nutritional Needs:   Kcal:  1300-1500 kcal  Protein:  65-80 grams  Fluid:  >/= 1.3 L/day  Mariana Single MS, RD, LDN, CNSC Clinical Nutrition Pager listed in [place]

## 2021-01-15 NOTE — Progress Notes (Addendum)
Consult NOTE    [name]  [id number] DOB: 1928-10-23 DOA: 01/14/2021 PCP: Denita Lung, MD   Chief Complaint  Patient presents with   Loss of Consciousness   Brief Narrative:  87 year old female with history of CAD status post CABG, status post TAVR, [name]. fib presents to the ER for the second time in last 24 hours.  Had come to the ER last night for shortness of breath work-up was unremarkable was discharged home.  This morning patient had 2 syncopal episode once while in the bed and second 1 while on the table eating breakfast.  Lasted for few seconds and regained consciousness.  Did not complain of any chest pain.  Was brought to the ER.  Was found to have [name]. fib with slow responses and 6-second pauses.  Cardiology was notified.  Hospitalist was consulted for patient having poor appetite early satiety failure to thrive.  Labs show hemoglobin 11.7 high sensitive troponin was 16 and 19.  Metabolic panel largely unremarkable.  Assessment & Plan:   Active Problems:   Hx of CABG 2006 with Maze   Atrial fibrillation with slow ventricular response (HCC)   Sinus pause   Syncope   Anemia   Weakness  [name]. fib with slow ventricular response and pauses Also hypotensive, requiring levophed  EP planning for pacemaker Per cardiology who is primary  Failure to thrive  Early Satiety  Early satiety for the past several weeks - months I think reasonable to pursue imaging of abdomen (then could consider chest, RUQ [place] if unrevealing) TSH wnl, cortisol.  Iron, folate, B12, ferritin wnl.  Vitamin C, b1, zinc ordered by RD Will work this up additionally once cardiac issues, hypotension managed  Anemia with history of GI bleed in December 2021 suspected to be likely from diverticular.  Follow CBC.  History of CAD status post CABG  hx Severe AS s/p TAVR  Hx CVA and CAS s/p CEA Noted Continue rosuvastatin  Hypokalemia  Replace and follow   DVT prophylaxis: SCD Code Status:  full Family Communication daughter at bedside Disposition:  Per primary team, cardiology  Procedures:  Echo IMPRESSIONS     1. Left ventricular ejection fraction, by estimation, is 55 to 60%. The  left ventricle has normal function. There is mild left ventricular  hypertrophy.   2. Right ventricular systolic function is mildly reduced. The right  ventricular size is normal.   3. The mitral valve is degenerative. Mild to moderate mitral valve  regurgitation. Severe mitral annular calcification.   4. Tricuspid valve regurgitation is mild to moderate.   5. Unable to assess TAVR valve hemodynamically due to limited acoustic  windows for imaging. Valve is grossly normal in appearance with trivial  central prosthetic AI. No rocking or dehiscence.. The aortic valve has  been repaired/replaced. Aortic valve  regurgitation is trivial. There is [name] 23 mm Sapien prosthetic (TAVR) valve  present in the aortic position. Procedure Date: 01/03/2017.   Antimicrobials:  Anti-infectives (From admission, onward)    Start     Dose/Rate Route Frequency Ordered Stop   01/15/21 1600  gentamicin (GARAMYCIN) 80 mg in sodium chloride 0.9 % 500 mL irrigation        80 mg Irrigation To ShortStay [facility] 01/15/21 1131 01/16/21 1600   01/15/21 1600  ceFAZolin (ANCEF) IVPB 2g/100 mL premix        2 g 200 mL/hr over 30 Minutes Intravenous To ShortStay Surgical 01/15/21 1131 01/16/21 1600  Subjective: No new complaints No LH No CP or SOB C/o early satiety, months  Objective: Vitals:   01/15/21 1130 01/15/21 1145 01/15/21 1150 01/15/21 1200  BP: (!) 131/52 (!) 140/55    Pulse:      Resp: (!) '24 14  19  '$ Temp:   98.7 F (37.1 C)   TempSrc:   Oral   SpO2: 97% 98%  95%  Weight:      Height:        Intake/Output Summary (Last 24 hours) at 01/15/2021 1441 Last data filed at 01/15/2021 1234 Gross per 24 hour  Intake 176.78 ml  Output 700 ml  Net -523.22 ml   Filed Weights   01/14/21  1642 01/15/21 0450  Weight: 40.8 kg 40.7 kg    Examination:  General exam: Appears calm and comfortable, cachetic Respiratory system: unlabored Cardiovascular system: S1 & S2 heard, RRR. Pacer pads in place. Gastrointestinal system: Abdomen is nondistended, soft and nontender. Central nervous system: Alert and oriented. No focal neurological deficits. Extremities: no LEE Skin: No rashes, lesions or ulcers Psychiatry: Judgement and insight appear normal. Mood & affect appropriate.     Data Reviewed: I have personally reviewed following labs and imaging studies  CBC: Recent Labs  Lab 01/13/21 2142 01/14/21 1643 01/14/21 2040  WBC 4.5 6.4 8.5  NEUTROABS  --  5.4  --   HGB 12.1 11.7* 12.8  HCT 36.3 37.4 38.9  MCV 91.9 93.3 90.7  PLT 186 187 [id number]    Basic Metabolic Panel: Recent Labs  Lab 01/13/21 2142 01/14/21 1643 01/15/21 0607 01/15/21 1213  NA 136 140 139  --   K 3.6 4.0 2.8* 3.2*  CL 99 104 102  --   CO2 '28 25 25  '$ --   GLUCOSE 133* 153* 114*  --   BUN '9 8 8  '$ --   CREATININE 0.53 0.54 0.51  --   CALCIUM 9.5 9.6 9.8  --   MG  --   --  2.1  --     GFR: Estimated Creatinine Clearance: 30.6 mL/min (by C-G formula based on SCr of 0.51 mg/dL).  Liver Function Tests: Recent Labs  Lab 01/15/21 0607  AST 18  ALT 15  ALKPHOS 41  BILITOT 1.1  PROT 7.1  ALBUMIN 4.0    CBG: Recent Labs  Lab 01/14/21 2027  GLUCAP 142*     Recent Results (from the past 240 hour(s))  Resp Panel by RT-PCR (Flu [name]&B, Covid) Nasopharyngeal Swab     Status: None   Collection Time: 01/14/21  5:03 PM   Specimen: Nasopharyngeal Swab; Nasopharyngeal(NP) swabs in vial transport medium  Result Value Ref Range Status   SARS Coronavirus 2 by RT PCR NEGATIVE NEGATIVE Final    Comment: (NOTE) SARS-CoV-2 target nucleic acids are NOT DETECTED.  The SARS-CoV-2 RNA is generally detectable in upper respiratory specimens during the acute phase of infection. The lowest concentration of  SARS-CoV-2 viral copies this assay can detect is 138 copies/mL. [name] negative result does not preclude SARS-Cov-2 infection and should not be used as the sole basis for treatment or other patient management decisions. [name] negative result may occur with  improper specimen collection/handling, submission of specimen other than nasopharyngeal swab, presence of viral mutation(s) within the areas targeted by this assay, and inadequate number of viral copies(<138 copies/mL). Shellia Hartl negative result must be combined with clinical observations, patient history, and epidemiological information. The expected result is Negative.  Fact Sheet for Patients:  EntrepreneurPulse.com.au  Fact  Sheet for Healthcare Providers:  IncredibleEmployment.be  This test is no t yet approved or cleared by the [place] FDA and  has been authorized for detection and/or diagnosis of SARS-CoV-2 by FDA under an Emergency Use Authorization (EUA). This EUA will remain  in effect (meaning this test can be used) for the duration of the COVID-19 declaration under Section 564(b)(1) of the Act, 21 U.S.C.section 360bbb-3(b)(1), unless the authorization is terminated  or revoked sooner.       Influenza Naesha Buckalew by PCR NEGATIVE NEGATIVE Final   Influenza B by PCR NEGATIVE NEGATIVE Final    Comment: (NOTE) The Xpert Xpress SARS-CoV-2/FLU/RSV plus assay is intended as an aid in the diagnosis of influenza from Nasopharyngeal swab specimens and should not be used as Mirinda Monte sole basis for treatment. Nasal washings and aspirates are unacceptable for Xpert Xpress SARS-CoV-2/FLU/RSV testing.  Fact Sheet for Patients: EntrepreneurPulse.com.au  Fact Sheet for Healthcare Providers: IncredibleEmployment.be  This test is not yet approved or cleared by the [place] FDA and has been authorized for detection and/or diagnosis of SARS-CoV-2 by FDA under an Emergency Use  Authorization (EUA). This EUA will remain in effect (meaning this test can be used) for the duration of the COVID-19 declaration under Section 564(b)(1) of the Act, 21 U.S.C. section 360bbb-3(b)(1), unless the authorization is terminated or revoked.  Performed at [facility], [place] [address], [place], [place] [address]   SARS CORONAVIRUS 2 (TAT 6-24 HRS) Nasopharyngeal Nasopharyngeal Swab     Status: None   Collection Time: 01/14/21  6:53 PM   Specimen: Nasopharyngeal Swab  Result Value Ref Range Status   SARS Coronavirus 2 NEGATIVE NEGATIVE Final    Comment: (NOTE) SARS-CoV-2 target nucleic acids are NOT DETECTED.  The SARS-CoV-2 RNA is generally detectable in upper and lower respiratory specimens during the acute phase of infection. Negative results do not preclude SARS-CoV-2 infection, do not rule out co-infections with other pathogens, and should not be used as the sole basis for treatment or other patient management decisions. Negative results must be combined with clinical observations, patient history, and epidemiological information. The expected result is Negative.  Fact Sheet for Patients: SugarRoll.be  Fact Sheet for Healthcare Providers: https://www.woods-mathews.com/  This test is not yet approved or cleared by the [place] FDA and  has been authorized for detection and/or diagnosis of SARS-CoV-2 by FDA under an Emergency Use Authorization (EUA). This EUA will remain  in effect (meaning this test can be used) for the duration of the COVID-19 declaration under Se ction 564(b)(1) of the Act, 21 U.S.C. section 360bbb-3(b)(1), unless the authorization is terminated or revoked sooner.  Performed at [facility], [place] [address] [place], [place] [address]   MRSA Next Gen by PCR, Nasal     Status: None   Collection Time: 01/14/21  8:30 PM   Specimen: Nasal Mucosa; Nasal Swab  Result Value Ref Range Status    MRSA by PCR Next Gen NOT DETECTED NOT DETECTED Final    Comment: (NOTE) The GeneXpert MRSA Assay (FDA approved for NASAL specimens only), is one component of [employer] [facility] comprehensive MRSA colonization surveillance program. It is not intended to diagnose MRSA infection nor to guide or monitor treatment for MRSA infections. Test performance is not FDA approved in patients less than 74 years old. Performed at [facility], [place] [address], [place], [place] [address]   Surgical PCR screen     Status: None   Collection Time: 01/15/21 11:45 AM  Specimen: Nasal Mucosa; Nasal Swab  Result Value Ref Range Status   MRSA, PCR NEGATIVE NEGATIVE Final   Staphylococcus aureus NEGATIVE NEGATIVE Final    Comment: (NOTE) The Xpert SA Assay (FDA approved for NASAL specimens in patients 55 years of age and older), is one component of Saban Heinlen comprehensive surveillance program. It is not intended to diagnose infection nor to guide or monitor treatment. Performed at [facility], [place] [address], [place], [place] [address]          Radiology Studies: DG Chest Port 1 View  Result Date: 01/14/2021 CLINICAL DATA:  Atrial fibrillation and syncope EXAM: PORTABLE CHEST 1 VIEW COMPARISON:  01/13/2021 FINDINGS: Numerous leads and wires project over the chest. Prior median sternotomy. CABG. Midline trachea. Normal heart size. Atherosclerosis in the transverse aorta. Possible trace right pleural fluid. Right costophrenic angle partially excluded. No pneumothorax. No congestive failure. Right lung base calcified granuloma. No lobar consolidation. IMPRESSION: No acute cardiopulmonary disease. Aortic Atherosclerosis (ICD10-I70.0). Possible trace right pleural fluid. Electronically Signed   By: Abigail Miyamoto M.D.   On: 01/14/2021 17:42   DG Chest Port 1 View  Result Date: 01/13/2021 CLINICAL DATA:  Headache and nausea this morning. Anxious and short of breath throughout the day. EXAM: PORTABLE CHEST 1 VIEW COMPARISON:   04/23/2020 FINDINGS: Postoperative changes in the mediastinum. Normal heart size and pulmonary vascularity. Cardiac valve prosthesis. Emphysematous changes in the lungs. Fibrosis in the upper lungs. Peribronchial thickening suggesting bronchitic change. Calcified granuloma in the right base. Mild blunting of the right costophrenic angle is unchanged since prior study, likely pleural thickening. Calcification of the aorta. Thoracolumbar scoliosis with degenerative change. IMPRESSION: Emphysematous and chronic bronchitic changes in the lungs. Fibrosis in the apices. Right pleural thickening. No active pulmonary disease. Electronically Signed   By: Lucienne Capers M.D.   On: 01/13/2021 21:55   ECHOCARDIOGRAM LIMITED  Result Date: 01/15/2021    ECHOCARDIOGRAM LIMITED REPORT   Patient Name:   LANEL HURTT Date of Exam: 01/15/2021 Medical Rec #:  [id number]     Height:       62.0 in Accession #:    [id number]    Weight:       89.7 lb Date of Birth:  1928-10-22    BSA:          1.360 m Patient Age:    35 years      BP:           119/44 mmHg Patient Gender: F             HR:           30 bpm. Exam Location:  Inpatient Procedure: Limited Echo, Cardiac Doppler and Color Doppler Indications:    Post TAVR evaluation Z95.2  History:        Patient has prior history of Echocardiogram examinations, most                 recent 02/26/2020. CAD and Previous Myocardial Infarction, Prior                 CABG, Carotid Disease, Aortic Valve Disease, Arrythmias:Atrial                 Fibrillation and LBBB, Signs/Symptoms:Hypotension, Murmur and                 Syncope; Risk Factors:Hypertension and Dyslipidemia. GERD.                 Aortic  Valve: 23 mm Sapien prosthetic, stented (TAVR) valve is                 present in the aortic position. Procedure Date: 01/03/2017.  Sonographer:    Darlina Sicilian RDCS Referring Phys: 43 RHONDA G BARRETT  Sonographer Comments: Externally paced at 30 bpm. Limited Apical and Subcostal views due to  external pacing. IMPRESSIONS  1. Left ventricular ejection fraction, by estimation, is 55 to 60%. The left ventricle has normal function. There is mild left ventricular hypertrophy.  2. Right ventricular systolic function is mildly reduced. The right ventricular size is normal.  3. The mitral valve is degenerative. Mild to moderate mitral valve regurgitation. Severe mitral annular calcification.  4. Tricuspid valve regurgitation is mild to moderate.  5. Unable to assess TAVR valve hemodynamically due to limited acoustic windows for imaging. Valve is grossly normal in appearance with trivial central prosthetic AI. No rocking or dehiscence.. The aortic valve has been repaired/replaced. Aortic valve regurgitation is trivial. There is Neale Marzette 23 mm Sapien prosthetic (TAVR) valve present in the aortic position. Procedure Date: 01/03/2017. FINDINGS  Left Ventricle: Left ventricular ejection fraction, by estimation, is 55 to 60%. The left ventricle has normal function. There is mild left ventricular hypertrophy. Right Ventricle: The right ventricular size is normal. Right ventricular systolic function is mildly reduced. Mitral Valve: The mitral valve is degenerative in appearance. There is moderate calcification of the mitral valve leaflet(s). Severe mitral annular calcification. Mild to moderate mitral valve regurgitation. Tricuspid Valve: The tricuspid valve is grossly normal. Tricuspid valve regurgitation is mild to moderate. Aortic Valve: Unable to assess TAVR valve hemodynamically due to limited acoustic windows for imaging. Valve is grossly normal in appearance with trivial central prosthetic AI. No rocking or dehiscence. The aortic valve has been repaired/replaced. Aortic  valve regurgitation is trivial. There is Matti Minney 23 mm Sapien prosthetic, stented (TAVR) valve present in the aortic position. Procedure Date: 01/03/2017. Pulmonic Valve: The pulmonic valve was grossly normal. Pulmonic valve regurgitation is mild to moderate.  LEFT VENTRICLE PLAX 2D LVIDd:         3.20 cm LVIDs:         2.17 cm LV PW:         0.90 cm LV IVS:        1.10 cm LVOT diam:     2.20 cm LVOT Area:     3.80 cm  LEFT ATRIUM         Index LA diam:    2.70 cm 1.99 cm/m  PULMONIC VALVE PV Vmax:          1.97 m/s PV Peak grad:     15.5 mmHg PR End Diast Vel: 7.84 msec  TRICUSPID VALVE TR Peak grad:   23.8 mmHg TR Vmax:        244.00 cm/s  SHUNTS Systemic Diam: 2.20 cm Cherlynn Kaiser MD Electronically signed by Cherlynn Kaiser MD Signature Date/Time: 01/15/2021/11:27:15 AM    Final    [place] EKG SITE RITE  Result Date: 01/15/2021 If Site Rite image not attached, placement could not be confirmed due to current cardiac rhythm.       Scheduled Meds:  budesonide  0.5 mg Inhalation BID   Chlorhexidine Gluconate Cloth  6 each Topical Daily   cholecalciferol  1,000 Units Oral Daily   docusate sodium  100 mg Oral Daily   feeding supplement  237 mL Oral BID BM   fluticasone  2 spray Each Nare Daily  gentamicin irrigation  80 mg Irrigation To SS-Surg   guaiFENesin  600 mg Oral BID   linaclotide  72 mcg Oral Q breakfast   loratadine  10 mg Oral Daily   pantoprazole  40 mg Oral BID   potassium chloride  40 mEq Oral Once   rosuvastatin  40 mg Oral Daily   sodium chloride flush  3 mL Intravenous Q12H   Continuous Infusions:  sodium chloride 250 mL (01/15/21 0918)   sodium chloride 50 mL/hr at 01/15/21 1230   sodium chloride Stopped (01/15/21 1231)    ceFAZolin (ANCEF) IV     DOPamine 5 mcg/kg/min (01/15/21 0830)   norepinephrine (LEVOPHED) Adult infusion 2 mcg/min (01/15/21 0906)     LOS: 1 day    Time spent: over 30 min    Fayrene Helper, MD [facility]   To contact the attending provider between 7A-7P or the covering provider during after hours 7P-7A, please log into the web site [email or web address] and access using universal [place] password for that web site. If you do not have the password, please call the hospital  operator.  01/15/2021, 2:41 PM

## 2021-01-15 NOTE — Progress Notes (Signed)
  Echocardiogram 2D Echocardiogram has been performed.  [name] M 01/15/2021, 10:30 AM

## 2021-01-16 ENCOUNTER — Inpatient Hospital Stay (HOSPITAL_COMMUNITY): Payer: Medicare Other

## 2021-01-16 ENCOUNTER — Encounter (HOSPITAL_COMMUNITY): Payer: Self-pay | Admitting: Internal Medicine

## 2021-01-16 DIAGNOSIS — I4891 Unspecified atrial fibrillation: Secondary | ICD-10-CM | POA: Diagnosis not present

## 2021-01-16 DIAGNOSIS — R55 Syncope and collapse: Secondary | ICD-10-CM | POA: Diagnosis not present

## 2021-01-16 DIAGNOSIS — E43 Unspecified severe protein-calorie malnutrition: Secondary | ICD-10-CM | POA: Insufficient documentation

## 2021-01-16 LAB — BASIC METABOLIC PANEL
Anion gap: 9 (ref 5–15)
BUN: 9 mg/dL (ref 8–23)
CO2: 21 mmol/L — ABNORMAL LOW (ref 22–32)
Calcium: 9.2 mg/dL (ref 8.9–10.3)
Chloride: 105 mmol/L (ref 98–111)
Creatinine, Ser: 0.63 mg/dL (ref 0.44–1.00)
GFR, Estimated: >60 mL/min (ref >60)
Glucose, Bld: 83 mg/dL (ref 70–99)
Potassium: 4.3 mmol/L (ref 3.5–5.1)
Sodium: 135 mmol/L (ref 135–145)

## 2021-01-16 LAB — PHOSPHORUS: Phosphorus: 2.5 mg/dL (ref 2.5–4.6)

## 2021-01-16 LAB — HEPATIC FUNCTION PANEL
ALT: 8 U/L (ref 0–44)
AST: 23 U/L (ref 15–41)
Albumin: 3.3 g/dL — ABNORMAL LOW (ref 3.5–5.0)
Alkaline Phosphatase: 39 U/L (ref 38–126)
Bilirubin, Direct: 0.3 mg/dL — ABNORMAL HIGH (ref 0.0–0.2)
Indirect Bilirubin: 0.7 mg/dL (ref 0.3–0.9)
Total Bilirubin: 1 mg/dL (ref 0.3–1.2)
Total Protein: 5.7 g/dL — ABNORMAL LOW (ref 6.5–8.1)

## 2021-01-16 LAB — CBC
HCT: 33.5 % — ABNORMAL LOW (ref 36.0–46.0)
Hemoglobin: 10.9 g/dL — ABNORMAL LOW (ref 12.0–15.0)
MCH: 29.5 pg (ref 26.0–34.0)
MCHC: 32.5 g/dL (ref 30.0–36.0)
MCV: 90.8 fL (ref 80.0–100.0)
Platelets: 140 10*3/uL — ABNORMAL LOW (ref 150–400)
RBC: 3.69 MIL/uL — ABNORMAL LOW (ref 3.87–5.11)
RDW: 13.6 % (ref 11.5–15.5)
WBC: 8.3 10*3/uL (ref 4.0–10.5)
nRBC: 0 % (ref 0.0–0.2)

## 2021-01-16 LAB — HEPARIN LEVEL (UNFRACTIONATED): Heparin Unfractionated: 0.27 IU/mL — ABNORMAL LOW (ref 0.30–0.70)

## 2021-01-16 LAB — APTT: aPTT: 38 seconds — ABNORMAL HIGH (ref 24–36)

## 2021-01-16 LAB — MAGNESIUM: Magnesium: 1.8 mg/dL (ref 1.7–2.4)

## 2021-01-16 MED ORDER — IOHEXOL 9 MG/ML PO SOLN
ORAL | Status: AC
  Administered 2021-01-16: 500 mL
  Filled 2021-01-16: qty 1000

## 2021-01-16 MED ORDER — APIXABAN 2.5 MG PO TABS
2.5000 mg | ORAL_TABLET | Freq: Two times a day (BID) | ORAL | Status: DC
Start: 2021-01-16 — End: 2021-01-19
  Administered 2021-01-16 – 2021-01-19 (×7): 2.5 mg via ORAL
  Filled 2021-01-16 (×7): qty 1

## 2021-01-16 MED ORDER — IOHEXOL 350 MG/ML SOLN
80.0000 mL | Freq: Once | INTRAVENOUS | Status: AC | PRN
  Administered 2021-01-16: 80 mL via INTRAVENOUS

## 2021-01-16 MED ORDER — METOPROLOL TARTRATE 25 MG PO TABS
25.0000 mg | ORAL_TABLET | Freq: Two times a day (BID) | ORAL | Status: DC
  Administered 2021-01-16 – 2021-01-19 (×6): 25 mg via ORAL
  Filled 2021-01-16 (×7): qty 1

## 2021-01-16 NOTE — Evaluation (Signed)
Physical Therapy Evaluation Patient Details Name: [name] MRN: [id number] DOB: 1928-12-17 Today's Date: 01/16/2021   History of Present Illness  87 y.o. female admitted on 01/15/21 for syncope and bradycardia.  Dx with PAF with significant pauses and is now s/p St. Jude dual chambar pacemaker on 01/15/21.  Pt also dx with FTT.  Pt with significant PMH of aortic stenosis s/p TAVR, CAD s/p CABG, diastolic dysfunction, fibromyalgia, HTN, MI, mitral regurgitation, PAF, pulmonary regurgitation, tricuspid regurgitation.  Clinical Impression  Assisted RN staff in getting pt in and out of transport chair as she was going from ICU to progressive care unit when I arrived for my assessment.  Pt is one to two person mod assist for bed mobility, transfers today which is weaker than her supervision with RW baseline (per daughter's report).  She is too weak to go home at this time, but I am hopeful for PT to assess gait with youth RW (likely needed due to stooped posture).  Daughter is very supportive, but I don't think she could handle [name] at home at her current level of assistance unless she bounces back quickly.  Will attempt to assess gait in the AM.  Daughter reports to wait until she is here (she plans to be here at 8am) so that she can see how her mom is doing.  PT to follow acutely for deficits listed below.       Follow Up Recommendations SNF (may progress to [facility], but PT to re-assess and attempt gait tomorrow.)    Equipment Recommendations  None recommended by PT    Recommendations for Other Services       Precautions / Restrictions Precautions Precautions: ICD/Pacemaker Precaution Comments: pacemaker 01/15/21 Restrictions Weight Bearing Restrictions: Yes LUE Weight Bearing: Non weight bearing      Mobility  Bed Mobility Overal bed mobility: Needs Assistance Bed Mobility: Supine to Sit     Supine to sit: Mod assist;+2 for physical assistance;HOB elevated     General bed  mobility comments: Two person mod assist to come to sitting EOB. Pt pulling with Right UE and assisted with pad to scoot around attempting to limit use of L UE.    Transfers Overall transfer level: Needs assistance Equipment used: 1 person hand held assist;2 person hand held assist Transfers: Sit to/from [facility] Sit to Stand: Mod assist;+2 physical assistance;From elevated surface Stand pivot transfers: +2 physical assistance;Mod assist;From elevated surface       General transfer comment: Two person mod assist for first stand and pivot to the transport chair to move from [facility] ICU to [place] progressive care unit.  Second transfer was one person mod assist to stand from transport chair and get into the bed, premature sit.  Ambulation/Gait             General Gait Details: NT, will need to be assessed tomorrow as if she cannot walk, I don't think she could go home with her daughter.  Stairs            Wheelchair Mobility    Modified Rankin (Stroke Patients Only)       Balance Overall balance assessment: Needs assistance Sitting-balance support: Feet supported;Bilateral upper extremity supported Sitting balance-[name] Scale: Poor Sitting balance - Comments: min assist EOB bil UE supported.   Standing balance support: Bilateral upper extremity supported Standing balance-[name] Scale: Poor Standing balance comment: support from PT and RW  Pertinent Vitals/Pain Pain Assessment: Faces Faces Pain Scale: No hurt    Home Living Family/patient expects to be discharged to:: Private residence Living Arrangements: Children Available Help at Discharge: Family;Available 24 hours/day Type of Home: House Home Access: Stairs to enter     Home Layout: One level Home Equipment: Environmental consultant - 2 wheels;Bedside commode;Transport chair Additional Comments: pt doesn't go into the bathroom, she uses her BSC and does sponge baths.  Daughter helps wtih bathing and provides S for stairs. Pt uses transport chair when out of the house    Prior Function Level of Independence: Needs assistance   Gait / Transfers Assistance Needed: daughter usually supervises her when she is up on her feet, walks bent over with RW at home at baseline.  ADL's / Homemaking Assistance Needed: assist getting to her back with bathing        Hand Dominance   Dominant Hand: Right    Extremity/Trunk Assessment   Upper Extremity Assessment Upper Extremity Assessment: LUE deficits/detail LUE Deficits / Details: tried to limit use of L UE especially for pushing and pulling due to pacemaker put in yesterday.  No over head motion assessed    Lower Extremity Assessment Lower Extremity Assessment: Generalized weakness    Cervical / Trunk Assessment Cervical / Trunk Assessment: Kyphotic  Communication   Communication: HOH  Cognition Arousal/Alertness: Awake/alert Behavior During Therapy: WFL for tasks assessed/performed Overall Cognitive Status: History of cognitive impairments - at baseline                                        General Comments      Exercises     Assessment/Plan    PT Assessment Patient needs continued PT services  PT Problem List Decreased strength;Decreased activity tolerance;Decreased balance;Decreased mobility;Decreased knowledge of use of DME;Decreased knowledge of precautions       PT Treatment Interventions DME instruction;Gait training;Stair training;Functional mobility training;Therapeutic activities;Therapeutic exercise;Balance training;Patient/family education    PT Goals (Current goals can be found in the Care Plan section)  Acute Rehab PT Goals Patient Stated Goal: daughter would like her to be strong enough to go home with home therapy (they have used home therapy in the past) PT Goal Formulation: With patient/family Time For Goal Achievement: 01/30/21 Potential to Achieve Goals:  Good    Frequency Min 3X/week   Barriers to discharge        Co-evaluation               AM-PAC PT "6 Clicks" Mobility  Outcome Measure Help needed turning from your back to your side while in a flat bed without using bedrails?: A Lot Help needed moving from lying on your back to sitting on the side of a flat bed without using bedrails?: A Lot Help needed moving to and from a bed to a chair (including a wheelchair)?: A Lot Help needed standing up from a chair using your arms (e.g., wheelchair or bedside chair)?: A Lot Help needed to walk in hospital room?: Total Help needed climbing 3-5 steps with a railing? : Total 6 Click Score: 10    End of Session   Activity Tolerance: Patient tolerated treatment well Patient left: in bed;with call bell/phone within reach;with nursing/sitter in room;with family/visitor present   PT Visit Diagnosis: Muscle weakness (generalized) (M62.81);Difficulty in walking, not elsewhere classified (R26.2)    Time: [id number] PT Time Calculation (min) (ACUTE ONLY):  15 min   Charges:   PT Evaluation $PT Eval Moderate Complexity: 1 Mod        Verdene Lennert, PT, DPT  Acute Rehabilitation Ortho Tech Supervisor [phone] pager [phone]) [phone] office

## 2021-01-16 NOTE — Progress Notes (Signed)
Progress Note  Patient Name: [name] Date of Encounter: 01/16/2021  Primary Cardiologist: Lauree Chandler, MD   Subjective   Feeling improved.  Pain from pacemaker insertion.  Otherwise no concerns.  Inpatient Medications    Scheduled Meds:  budesonide  0.5 mg Inhalation BID   Chlorhexidine Gluconate Cloth  6 each Topical Daily   cholecalciferol  1,000 Units Oral Daily   docusate sodium  100 mg Oral Daily   feeding supplement  237 mL Oral BID BM   fluticasone  2 spray Each Nare Daily   guaiFENesin  600 mg Oral BID   linaclotide  72 mcg Oral Q breakfast   loratadine  10 mg Oral Daily   pantoprazole  40 mg Oral BID   rosuvastatin  40 mg Oral Daily   sodium chloride flush  3 mL Intravenous Q12H   thiamine injection  100 mg Intravenous Daily   Continuous Infusions:  sodium chloride Stopped (01/15/21 1230)    ceFAZolin (ANCEF) IV 1 g (01/16/21 0417)   DOPamine Stopped (01/15/21 0904)   norepinephrine (LEVOPHED) Adult infusion Stopped (01/15/21 1713)   PRN Meds: acetaminophen, albuterol, ALPRAZolam, diazepam, guaiFENesin, nitroGLYCERIN, ondansetron (ZOFRAN) IV, polyethylene glycol, zolpidem   Vital Signs    Vitals:   01/16/21 0500 01/16/21 0530 01/16/21 0600 01/16/21 0735  BP: 137/70 (!) 108/51 114/62 129/81  Pulse:    94  Resp: (!) '28 16 18 12  '$ Temp:      TempSrc:      SpO2: 97% 98% 98% 98%  Weight: 39.6 kg     Height:        Intake/Output Summary (Last 24 hours) at 01/16/2021 [id number] Last data filed at 01/16/2021 0700 Gross per 24 hour  Intake 307.8 ml  Output 1300 ml  Net -992.2 ml    Filed Weights   01/14/21 1642 01/15/21 0450 01/16/21 0500  Weight: 40.8 kg 40.7 kg 39.6 kg    Telemetry    Atrial fibrillation, intermittent ventricular paced-personally reviewed  ECG    None new  Physical Exam   GEN: Thin, malnourished, elderly appearing female HEENT: normal  Neck: no JVD, carotid bruits, or masses Cardiac: irregular; no murmurs, rubs,  or gallops,no edema  Respiratory:  clear to auscultation bilaterally, normal work of breathing GI: soft, nontender, nondistended, + BS MS: no deformity or atrophy  Skin: warm and dry, device site well healed Neuro:  Strength and sensation are intact Psych: euthymic mood, full affect   Labs    Chemistry Recent Labs  Lab 01/14/21 1643 01/15/21 0607 01/15/21 1213 01/16/21 0234  NA 140 139  --  135  K 4.0 2.8* 3.2* 4.3  CL 104 102  --  105  CO2 25 25  --  21*  GLUCOSE 153* 114*  --  83  BUN 8 8  --  9  CREATININE 0.54 0.51  --  0.63  CALCIUM 9.6 9.8  --  9.2  PROT  --  7.1  --   --   ALBUMIN  --  4.0  --   --   AST  --  18  --   --   ALT  --  15  --   --   ALKPHOS  --  41  --   --   BILITOT  --  1.1  --   --   GFRNONAA >60 >60  --  >60  ANIONGAP 11 12  --  9      Hematology Recent Labs  Lab 01/14/21 1643 01/14/21 2040 01/16/21 0234  WBC 6.4 8.5 8.3  RBC 4.01 4.29  4.25 3.69*  HGB 11.7* 12.8 10.9*  HCT 37.4 38.9 33.5*  MCV 93.3 90.7 90.8  MCH 29.2 29.8 29.5  MCHC 31.3 32.9 32.5  RDW 13.3 13.4 13.6  PLT 187 180 140*     Cardiac EnzymesNo results for input(s): TROPONINI in the last 168 hours. No results for input(s): TROPIPOC in the last 168 hours.   BNPNo results for input(s): BNP, PROBNP in the last 168 hours.   DDimer No results for input(s): DDIMER in the last 168 hours.   Radiology    EP PPM/ICD IMPLANT  Result Date: 01/15/2021 SURGEON:  Allegra Lai, MD   PREPROCEDURE DIAGNOSIS:  atrial fibrillation, heart block   POSTPROCEDURE DIAGNOSIS:  atrial fibrillation, heart block    PROCEDURES:  1. Pacemaker implantation.   INTRODUCTION:  [name] is a 87 y.o. female with a history of bradycardia who presents today for pacemaker implantation.  The patient reports intermittent episodes of dizziness over the past few months.  No reversible causes have been identified.  The patient therefore presents today for pacemaker implantation.   DESCRIPTION OF  PROCEDURE:  Informed written consent was obtained, and  the patient was brought to the electrophysiology lab in a fasting state.  The patient required no sedation for the procedure today.  The patients left chest was prepped and draped in the usual sterile fashion by the EP lab staff. The skin overlying the left deltopectoral region was infiltrated with lidocaine for local analgesia.  A 4-cm incision was made over the left deltopectoral region.  A left subcutaneous pacemaker pocket was fashioned using a combination of sharp and blunt dissection. Electrocautery was required to assure hemostasis.  RA/RV Lead Placement: The left axillary vein was therefore cannulated.  Through the left axillary vein, a [employer] model Tendril MRI [id number] (serial number  [id number]) right atrial lead and an [employer] model Tendril MRI [id number] (serial number  [id number]) right ventricular lead were advanced with fluoroscopic visualization into the right atrial appendage and right ventricular apex positions respectively.  Initial atrial lead P- waves measured 3.4 mV with impedance of 430 ohms in atrial fibrillation.  Right ventricular lead was dependent with an impedance of 650 ohms and a threshold of 0.5 V at 0.5 msec.  Both leads were secured to the pectoralis fascia using #2-0 silk over the suture sleeves. Device Placement:  The leads were then connected to an [place] MRI  model [id number] (serial number  [id number] ) pacemaker.  The pocket was irrigated with copious gentamicin solution.  The pacemaker was then placed into the pocket.  The pocket was then closed in 3 layers with 2.0 Vicryl suture for the 3.0 Vicryl suture subcutaneous and subcuticular layers.  Steri-  Strips and a sterile dressing were then applied. EBL<16m.  There were no early apparent complications.   CONCLUSIONS:  1. Successful implantation of a St Jude Medical Assurity MRI dual-chamber pacemaker for symptomatic bradycardia  2. No early apparent  complications.       WAllegra Lai MD 01/15/2021 5:28 PM   DG Chest Port 1 View  Result Date: 01/14/2021 CLINICAL DATA:  Atrial fibrillation and syncope EXAM: PORTABLE CHEST 1 VIEW COMPARISON:  01/13/2021 FINDINGS: Numerous leads and wires project over the chest. Prior median sternotomy. CABG. Midline trachea. Normal heart size. Atherosclerosis in the transverse aorta. Possible trace right pleural fluid. Right costophrenic angle partially excluded. No pneumothorax.  No congestive failure. Right lung base calcified granuloma. No lobar consolidation. IMPRESSION: No acute cardiopulmonary disease. Aortic Atherosclerosis (ICD10-I70.0). Possible trace right pleural fluid. Electronically Signed   By: Abigail Miyamoto M.D.   On: 01/14/2021 17:42   ECHOCARDIOGRAM LIMITED  Result Date: 01/15/2021    ECHOCARDIOGRAM LIMITED REPORT   Patient Name:   [name] Date of Exam: 01/15/2021 Medical Rec #:  [id number]     Height:       62.0 in Accession #:    [id number]    Weight:       89.7 lb Date of Birth:  12-06-1928    BSA:          1.360 m Patient Age:    57 years      BP:           119/44 mmHg Patient Gender: F             HR:           30 bpm. Exam Location:  Inpatient Procedure: Limited Echo, Cardiac Doppler and Color Doppler Indications:    Post TAVR evaluation Z95.2  History:        Patient has prior history of Echocardiogram examinations, most                 recent 02/26/2020. CAD and Previous Myocardial Infarction, Prior                 CABG, Carotid Disease, Aortic Valve Disease, Arrythmias:Atrial                 Fibrillation and LBBB, Signs/Symptoms:Hypotension, Murmur and                 Syncope; Risk Factors:Hypertension and Dyslipidemia. GERD.                 Aortic Valve: 23 mm Sapien prosthetic, stented (TAVR) valve is                 present in the aortic position. Procedure Date: 01/03/2017.  Sonographer:    Darlina Sicilian RDCS Referring Phys: 67 RHONDA G BARRETT  Sonographer Comments: Externally paced at 30  bpm. Limited Apical and Subcostal views due to external pacing. IMPRESSIONS  1. Left ventricular ejection fraction, by estimation, is 55 to 60%. The left ventricle has normal function. There is mild left ventricular hypertrophy.  2. Right ventricular systolic function is mildly reduced. The right ventricular size is normal.  3. The mitral valve is degenerative. Mild to moderate mitral valve regurgitation. Severe mitral annular calcification.  4. Tricuspid valve regurgitation is mild to moderate.  5. Unable to assess TAVR valve hemodynamically due to limited acoustic windows for imaging. Valve is grossly normal in appearance with trivial central prosthetic AI. No rocking or dehiscence.. The aortic valve has been repaired/replaced. Aortic valve regurgitation is trivial. There is a 23 mm Sapien prosthetic (TAVR) valve present in the aortic position. Procedure Date: 01/03/2017. FINDINGS  Left Ventricle: Left ventricular ejection fraction, by estimation, is 55 to 60%. The left ventricle has normal function. There is mild left ventricular hypertrophy. Right Ventricle: The right ventricular size is normal. Right ventricular systolic function is mildly reduced. Mitral Valve: The mitral valve is degenerative in appearance. There is moderate calcification of the mitral valve leaflet(s). Severe mitral annular calcification. Mild to moderate mitral valve regurgitation. Tricuspid Valve: The tricuspid valve is grossly normal. Tricuspid valve regurgitation is mild to moderate. Aortic Valve: Unable to assess TAVR valve hemodynamically  due to limited acoustic windows for imaging. Valve is grossly normal in appearance with trivial central prosthetic AI. No rocking or dehiscence. The aortic valve has been repaired/replaced. Aortic  valve regurgitation is trivial. There is a 23 mm Sapien prosthetic, stented (TAVR) valve present in the aortic position. Procedure Date: 01/03/2017. Pulmonic Valve: The pulmonic valve was grossly normal.  Pulmonic valve regurgitation is mild to moderate. LEFT VENTRICLE PLAX 2D LVIDd:         3.20 cm LVIDs:         2.17 cm LV PW:         0.90 cm LV IVS:        1.10 cm LVOT diam:     2.20 cm LVOT Area:     3.80 cm  LEFT ATRIUM         Index LA diam:    2.70 cm 1.99 cm/m  PULMONIC VALVE PV Vmax:          1.97 m/s PV Peak grad:     15.5 mmHg PR End Diast Vel: 7.84 msec  TRICUSPID VALVE TR Peak grad:   23.8 mmHg TR Vmax:        244.00 cm/s  SHUNTS Systemic Diam: 2.20 cm Cherlynn Kaiser MD Electronically signed by Cherlynn Kaiser MD Signature Date/Time: 01/15/2021/11:27:15 AM    Final    [place] EKG SITE RITE  Result Date: 01/15/2021 If Site Rite image not attached, placement could not be confirmed due to current cardiac rhythm.   Cardiac Studies   ECHO: 02/26/2020  1. Left ventricular ejection fraction, by estimation, is 55 to 60%. The  left ventricle has normal function. The left ventricle has no regional  wall motion abnormalities. There is mild asymmetric left ventricular  hypertrophy of the basal-septal segment.  Left ventricular diastolic function could not be evaluated.   2. Right ventricular systolic function is normal. The right ventricular  size is normal. There is mildly elevated pulmonary artery systolic  pressure. The estimated right ventricular systolic pressure is [id number] mmHg.   3. The mitral valve is degenerative. Mild mitral valve regurgitation. No evidence of mitral stenosis.   4. 23 mm S3 TAVR. V max 2.1 m/s, MG 8.4 mmHG, AVA 1.88 cm2, DI 0.60. No regurgitation or paravalvular leak. Normal prosthesis. The aortic valve has been repaired/replaced. Aortic valve regurgitation is not visualized. There is a 23 mm Sapien prosthetic  (TAVR) valve present in the aortic position. Procedure Date: 01/03/2017.   5. The inferior vena cava is normal in size with greater than 50%  respiratory variability, suggesting right atrial pressure of 3 mmHg.   Comparison(s): No significant change from prior study.  Stable EF. Stable TAVR gradients.    Cardiac cath: 10/27/2016 Ost Cx to Prox Cx lesion, 50 %stenosed. Ost 2nd Mrg to 2nd Mrg lesion, 10 %stenosed. SVG graft was visualized by angiography and is normal in caliber. Mid RCA to Dist RCA lesion, 100 %stenosed. SVG graft was visualized by angiography and is normal in caliber. SVG graft was visualized by angiography and is normal in caliber. Mid LAD lesion, 100 %stenosed. 1st Diag lesion, 50 %stenosed. LIMA graft was visualized by non-selective angiography and is small. Dist LAD lesion, 40 %stenosed.   1. Severe triple vessel CAD s/p 4V CABG with 3/4 patent bypass grafts 2. The mid LAD is chronically occluded. The vein graft to the mid LAD is patent 3. The proximal Circumflex has moderate stenosis. The mid Circumflex stent is patent. The vein graft to the second  OM is patent 4. The mid RCA is chronically occluded. The vein graft to the distal RCA is patent 5. The LIMA to the Diagonal is atretic. The Diagonal branch as moderate non-obstructive disease.  6. Severe aortic stenosis (mean gradient 41.5 mmHg, peak to peak gradient 41 mmHg, AVA 0.76cm2).    Recommendations: Lucresha Dismuke continue planning for TAVR. She Umberto Pavek be referred to see the CT surgeons on our TAVR team and Madeeha Costantino need to have her pre op testing arranged   Patient Profile     87 y.o. female hx of CAD s/p prior PCI and 2006 CABG, PAF s/p 2006 MAZE, Severe AS s/p 2018 TAVR, prior hx of CVA and CAS s/p R CEA, HTN and HLD, prior history of 2nd HB and LBBB who presented for re-evaluation in the ED for second evaluation for SOB and weakness.  Had syncope associated with 6+s pauses.   Assessment & Plan    1.  Paroxysmal atrial fibrillation: Was having significant pauses.  She is now status post Public house manager.  Device functioning appropriately.  Chest x-ray and interrogation without issue.  She was on Eliquis as a home medication.  We Rupinder Livingston plan to start that today.  2.   Hypotension: Has improved with pacing.  Also has improved with normal saline.  We Jakiah Goree continue with current management.  3.  Hypokalemia: Has since normalized.  4.  Coronary artery disease status post CABG: Continue Crestor.  No current chest pain.  5.  Severe aortic stenosis: Status post TAVR.  6.  Severe malnutrition with disability: We Manuel Dall need to work with PT and OT and my guess is that she Kerstie Agent need discharge to SNF.  Nutrition has been consulted.  She Inika Bellanger need likely nutrition assistance going home.  We Kevan Prouty transfer her to the floor for further work with PT and OT.   For questions or updates, please contact [place] Please consult [email or web address] for contact info under Cardiology/STEMI.      Signed, Delora Gravatt Meredith Leeds, MD  01/16/2021, 8:33 AM

## 2021-01-16 NOTE — Progress Notes (Signed)
Consult NOTE    [name]  [id number] DOB: 06/21/1928 DOA: 01/14/2021 PCP: Lori Lung, MD   Chief Complaint  Patient presents with   Loss of Consciousness   Brief Narrative:  87 year old female with history of CAD status post CABG, status post TAVR, [name]. fib presents to the ER for the second time in last 24 hours.  Had come to the ER last night for shortness of breath work-up was unremarkable was discharged home.  This morning patient had 2 syncopal episode once while in the bed and second 1 while on the table eating breakfast.  Lasted for few seconds and regained consciousness.  Did not complain of any chest pain.  Was brought to the ER.  Was found to have [name]. fib with slow responses and 6-second pauses.  Cardiology was notified.  Hospitalist was consulted for patient having poor appetite early satiety failure to thrive.  Labs show hemoglobin 11.7 high sensitive troponin was 16 and 19.  Metabolic panel largely unremarkable.  Assessment & Plan:   Active Problems:   Hx of CABG 2006 with [facility]   Atrial fibrillation with slow ventricular response (HCC)   Sinus pause   Syncope   Anemia   Weakness   Protein-calorie malnutrition, severe  [name]. fib with slow ventricular response and pauses  Hypotension Off pressors EP planning for pacemaker - s/p pacemaker implantation 8/26 Per cardiology who is primary - eliquis being resumed  Failure to thrive  Early Satiety  Early satiety for the past several weeks - months Will plan for CT chest, abdomen, pelvis to eval  TSH wnl, cortisol.  Iron, folate, B12, ferritin wnl.  Vitamin C, b1, zinc ordered by RD, appreciate dietitian assistance   Anemia with history of GI bleed in December 2021 suspected to be likely from diverticular.  Follow CBC.  History of CAD status post CABG  hx Severe AS s/p TAVR  Hx CVA and CAS s/p CEA Noted Continue rosuvastatin  Hypokalemia  improved  DVT prophylaxis: SCD Code Status: full Family  Communication daughter at bedside 8/27 Disposition:  Per primary team, cardiology  Procedures:  Echo IMPRESSIONS     1. Left ventricular ejection fraction, by estimation, is 55 to 60%. The  left ventricle has normal function. There is mild left ventricular  hypertrophy.   2. Right ventricular systolic function is mildly reduced. The right  ventricular size is normal.   3. The mitral valve is degenerative. Mild to moderate mitral valve  regurgitation. Severe mitral annular calcification.   4. Tricuspid valve regurgitation is mild to moderate.   5. Unable to assess TAVR valve hemodynamically due to limited acoustic  windows for imaging. Valve is grossly normal in appearance with trivial  central prosthetic AI. No rocking or dehiscence.. The aortic valve has  been repaired/replaced. Aortic valve  regurgitation is trivial. There is [name] 23 mm Sapien prosthetic (TAVR) valve  present in the aortic position. Procedure Date: 01/03/2017.   Antimicrobials:  Anti-infectives (From admission, onward)    Start     Dose/Rate Route Frequency Ordered Stop   01/15/21 2230  ceFAZolin (ANCEF) IVPB 1 g/50 mL premix        1 g 100 mL/hr over 30 Minutes Intravenous Every 6 hours 01/15/21 1823 01/16/21 1629   01/15/21 1600  gentamicin (GARAMYCIN) 80 mg in sodium chloride 0.9 % 500 mL irrigation        80 mg Irrigation To [facility] 01/15/21 1131 01/15/21 1708   01/15/21 1600  ceFAZolin (  ANCEF) IVPB 2g/100 mL premix        2 g 200 mL/hr over 30 Minutes Intravenous To [facility] 01/15/21 1131 01/15/21 1655          Subjective: No new complaints  Objective: Vitals:   01/16/21 0500 01/16/21 0530 01/16/21 0600 01/16/21 0735  BP: 137/70 (!) 108/51 114/62 129/81  Pulse:    94  Resp: (!) '28 16 18 12  '$ Temp:      TempSrc:      SpO2: 97% 98% 98% 98%  Weight: 39.6 kg     Height:        Intake/Output Summary (Last 24 hours) at 01/16/2021 0847 Last data filed at 01/16/2021 0700 Gross  per 24 hour  Intake 307.8 ml  Output 1300 ml  Net -992.2 ml   Filed Weights   01/14/21 1642 01/15/21 0450 01/16/21 0500  Weight: 40.8 kg 40.7 kg 39.6 kg    Examination:  General: No acute distress. Cardiovascular: RRR Lungs: unlabored Abdomen: Soft, nontender, nondistended Neurological: Alert and oriented 3. Moves all extremities 4 . Cranial nerves II through XII grossly intact. Skin: Warm and dry. No rashes or lesions. Extremities: No clubbing or cyanosis. No edema.   Data Reviewed: I have personally reviewed following labs and imaging studies  CBC: Recent Labs  Lab 01/13/21 2142 01/14/21 1643 01/14/21 2040 01/16/21 0234  WBC 4.5 6.4 8.5 8.3  NEUTROABS  --  5.4  --   --   HGB 12.1 11.7* 12.8 10.9*  HCT 36.3 37.4 38.9 33.5*  MCV 91.9 93.3 90.7 90.8  PLT 186 187 180 140*    Basic Metabolic Panel: Recent Labs  Lab 01/13/21 2142 01/14/21 1643 01/15/21 0607 01/15/21 1213 01/16/21 0234  NA 136 140 139  --  135  K 3.6 4.0 2.8* 3.2* 4.3  CL 99 104 102  --  105  CO2 '28 25 25  '$ --  21*  GLUCOSE 133* 153* 114*  --  83  BUN '9 8 8  '$ --  9  CREATININE 0.53 0.54 0.51  --  0.63  CALCIUM 9.5 9.6 9.8  --  9.2  MG  --   --  2.1  --  1.8  PHOS  --   --   --   --  2.5    GFR: Estimated Creatinine Clearance: 29.8 mL/min (by C-G formula based on SCr of 0.63 mg/dL).  Liver Function Tests: Recent Labs  Lab 01/15/21 0607  AST 18  ALT 15  ALKPHOS 41  BILITOT 1.1  PROT 7.1  ALBUMIN 4.0    CBG: Recent Labs  Lab 01/14/21 2027  GLUCAP 142*     Recent Results (from the past 240 hour(s))  Resp Panel by RT-PCR (Flu [name]&B, Covid) Nasopharyngeal Swab     Status: None   Collection Time: 01/14/21  5:03 PM   Specimen: Nasopharyngeal Swab; Nasopharyngeal(NP) swabs in vial transport medium  Result Value Ref Range Status   SARS Coronavirus 2 by RT PCR NEGATIVE NEGATIVE Final    Comment: (NOTE) SARS-CoV-2 target nucleic acids are NOT DETECTED.  The SARS-CoV-2 RNA is  generally detectable in upper respiratory specimens during the acute phase of infection. The lowest concentration of SARS-CoV-2 viral copies this assay can detect is 138 copies/mL. [name] negative result does not preclude SARS-Cov-2 infection and should not be used as the sole basis for treatment or other patient management decisions. Carsynn Bethune negative result may occur with  improper specimen collection/handling, submission of specimen other than nasopharyngeal  swab, presence of viral mutation(s) within the areas targeted by this assay, and inadequate number of viral copies(<138 copies/mL). Marayah Higdon negative result must be combined with clinical observations, patient history, and epidemiological information. The expected result is Negative.  Fact Sheet for Patients:  EntrepreneurPulse.com.au  Fact Sheet for Healthcare Providers:  IncredibleEmployment.be  This test is no t yet approved or cleared by the [place] FDA and  has been authorized for detection and/or diagnosis of SARS-CoV-2 by FDA under an Emergency Use Authorization (EUA). This EUA will remain  in effect (meaning this test can be used) for the duration of the COVID-19 declaration under Section 564(b)(1) of the Act, 21 U.S.C.section 360bbb-3(b)(1), unless the authorization is terminated  or revoked sooner.       Influenza Tammela Bales by PCR NEGATIVE NEGATIVE Final   Influenza B by PCR NEGATIVE NEGATIVE Final    Comment: (NOTE) The Xpert Xpress SARS-CoV-2/FLU/RSV plus assay is intended as an aid in the diagnosis of influenza from Nasopharyngeal swab specimens and should not be used as Boston Catarino sole basis for treatment. Nasal washings and aspirates are unacceptable for Xpert Xpress SARS-CoV-2/FLU/RSV testing.  Fact Sheet for Patients: EntrepreneurPulse.com.au  Fact Sheet for Healthcare Providers: IncredibleEmployment.be  This test is not yet approved or cleared by the [place] FDA and has been authorized for detection and/or diagnosis of SARS-CoV-2 by FDA under an Emergency Use Authorization (EUA). This EUA will remain in effect (meaning this test can be used) for the duration of the COVID-19 declaration under Section 564(b)(1) of the Act, 21 U.S.C. section 360bbb-3(b)(1), unless the authorization is terminated or revoked.  Performed at [facility], [place] [address] [place], [place] [address]   SARS CORONAVIRUS 2 (TAT 6-24 HRS) Nasopharyngeal Nasopharyngeal Swab     Status: None   Collection Time: 01/14/21  6:53 PM   Specimen: Nasopharyngeal Swab  Result Value Ref Range Status   SARS Coronavirus 2 NEGATIVE NEGATIVE Final    Comment: (NOTE) SARS-CoV-2 target nucleic acids are NOT DETECTED.  The SARS-CoV-2 RNA is generally detectable in upper and lower respiratory specimens during the acute phase of infection. Negative results do not preclude SARS-CoV-2 infection, do not rule out co-infections with other pathogens, and should not be used as the sole basis for treatment or other patient management decisions. Negative results must be combined with clinical observations, patient history, and epidemiological information. The expected result is Negative.  Fact Sheet for Patients: SugarRoll.be  Fact Sheet for Healthcare Providers: https://www.woods-mathews.com/  This test is not yet approved or cleared by the [place] FDA and  has been authorized for detection and/or diagnosis of SARS-CoV-2 by FDA under an Emergency Use Authorization (EUA). This EUA will remain  in effect (meaning this test can be used) for the duration of the COVID-19 declaration under Se ction 564(b)(1) of the Act, 21 U.S.C. section 360bbb-3(b)(1), unless the authorization is terminated or revoked sooner.  Performed at [facility], [place] [address] [place], [place] [address]   MRSA Next Gen by PCR, Nasal     Status: None    Collection Time: 01/14/21  8:30 PM   Specimen: Nasal Mucosa; Nasal Swab  Result Value Ref Range Status   MRSA by PCR Next Gen NOT DETECTED NOT DETECTED Final    Comment: (NOTE) The GeneXpert MRSA Assay (FDA approved for NASAL specimens only), is one component of Binnie Vonderhaar comprehensive MRSA colonization surveillance program. It is not intended to diagnose MRSA infection nor to guide or monitor treatment  for MRSA infections. Test performance is not FDA approved in patients less than 39 years old. Performed at [facility], [place] [address], [place], [place] [address]   Surgical PCR screen     Status: None   Collection Time: 01/15/21 11:45 AM   Specimen: Nasal Mucosa; Nasal Swab  Result Value Ref Range Status   MRSA, PCR NEGATIVE NEGATIVE Final   Staphylococcus aureus NEGATIVE NEGATIVE Final    Comment: (NOTE) The Xpert SA Assay (FDA approved for NASAL specimens in patients 4 years of age and older), is one component of Naturi Alarid comprehensive surveillance program. It is not intended to diagnose infection nor to guide or monitor treatment. Performed at [facility], [place] [address], [place], [place] [address]          Radiology Studies: EP PPM/ICD IMPLANT  Result Date: 01/15/2021 SURGEON:  Allegra Lai, MD   PREPROCEDURE DIAGNOSIS:  atrial fibrillation, heart block   POSTPROCEDURE DIAGNOSIS:  atrial fibrillation, heart block    PROCEDURES:  1. Pacemaker implantation.   INTRODUCTION:  [name] is Yerlin Gasparyan 87 y.o. female with Danna Sewell history of bradycardia who presents today for pacemaker implantation.  The patient reports intermittent episodes of dizziness over the past few months.  No reversible causes have been identified.  The patient therefore presents today for pacemaker implantation.   DESCRIPTION OF PROCEDURE:  Informed written consent was obtained, and  the patient was brought to the electrophysiology lab in Journe Hallmark fasting state.  The patient required no sedation for the procedure today.   The patients left chest was prepped and draped in the usual sterile fashion by the EP lab staff. The skin overlying the left deltopectoral region was infiltrated with lidocaine for local analgesia.  Fatin Bachicha 4-cm incision was made over the left deltopectoral region.  Amiley Shishido left subcutaneous pacemaker pocket was fashioned using Jensine Luz combination of sharp and blunt dissection. Electrocautery was required to assure hemostasis.  RA/RV Lead Placement: The left axillary vein was therefore cannulated.  Through the left axillary vein, Brenten Janney [employer] model Tendril MRI [id number] (serial number  [id number]) right atrial lead and an [employer] model Tendril MRI [id number] (serial number  [id number]) right ventricular lead were advanced with fluoroscopic visualization into the right atrial appendage and right ventricular apex positions respectively.  Initial atrial lead P- waves measured 3.4 mV with impedance of 430 ohms in atrial fibrillation.  Right ventricular lead was dependent with an impedance of 650 ohms and Mariaceleste Herrera threshold of 0.5 V at 0.5 msec.  Both leads were secured to the pectoralis fascia using #2-0 silk over the suture sleeves. Device Placement:  The leads were then connected to an [place] MRI  model [id number] (serial number  [id number] ) pacemaker.  The pocket was irrigated with copious gentamicin solution.  The pacemaker was then placed into the pocket.  The pocket was then closed in 3 layers with 2.0 Vicryl suture for the 3.0 Vicryl suture subcutaneous and subcuticular layers.  Steri-  Strips and Matias Thurman sterile dressing were then applied. EBL<65m.  There were no early apparent complications.   CONCLUSIONS:  1. Successful implantation of Erice Ahles [employer] Medical Assurity MRI dual-chamber pacemaker for symptomatic bradycardia  2. No early apparent complications.       WAllegra Lai MD 01/15/2021 5:28 PM   DG Chest Port 1 View  Result Date: 01/14/2021 CLINICAL DATA:  Atrial fibrillation and syncope EXAM: PORTABLE CHEST 1 VIEW  COMPARISON:  01/13/2021 FINDINGS: Numerous leads and wires  project over the chest. Prior median sternotomy. CABG. Midline trachea. Normal heart size. Atherosclerosis in the transverse aorta. Possible trace right pleural fluid. Right costophrenic angle partially excluded. No pneumothorax. No congestive failure. Right [name] base calcified granuloma. No lobar consolidation. IMPRESSION: No acute cardiopulmonary disease. Aortic Atherosclerosis (ICD10-I70.0). Possible trace right pleural fluid. Electronically Signed   By: Abigail Miyamoto M.D.   On: 01/14/2021 17:42   ECHOCARDIOGRAM LIMITED  Result Date: 01/15/2021    ECHOCARDIOGRAM LIMITED REPORT   Patient Name:   [name] Date of Exam: 01/15/2021 Medical Rec #:  [id number]     Height:       62.0 in Accession #:    [id number]    Weight:       89.7 lb Date of Birth:  [birth year]    BSA:          1.360 m Patient Age:    45 years      BP:           119/44 mmHg Patient Gender: F             HR:           30 bpm. Exam Location:  Inpatient Procedure: Limited Echo, Cardiac Doppler and Color Doppler Indications:    Post TAVR evaluation Z95.2  History:        Patient has prior history of Echocardiogram examinations, most                 recent 02/26/2020. CAD and Previous Myocardial Infarction, Prior                 CABG, Carotid Disease, Aortic Valve Disease, Arrythmias:Atrial                 Fibrillation and LBBB, Signs/Symptoms:Hypotension, Murmur and                 Syncope; Risk Factors:Hypertension and Dyslipidemia. GERD.                 Aortic Valve: 23 mm Sapien prosthetic, stented (TAVR) valve is                 present in the aortic position. Procedure Date: 01/03/2017.  Sonographer:    Darlina Sicilian RDCS Referring Phys: 45 RHONDA G BARRETT  Sonographer Comments: Externally paced at 30 bpm. Limited Apical and Subcostal views due to external pacing. IMPRESSIONS  1. Left ventricular ejection fraction, by estimation, is 55 to 60%. The left ventricle has normal  function. There is mild left ventricular hypertrophy.  2. Right ventricular systolic function is mildly reduced. The right ventricular size is normal.  3. The mitral valve is degenerative. Mild to moderate mitral valve regurgitation. Severe mitral annular calcification.  4. Tricuspid valve regurgitation is mild to moderate.  5. Unable to assess TAVR valve hemodynamically due to limited acoustic windows for imaging. Valve is grossly normal in appearance with trivial central prosthetic AI. No rocking or dehiscence.. The aortic valve has been repaired/replaced. Aortic valve regurgitation is trivial. There is Solly Derasmo 23 mm Sapien prosthetic (TAVR) valve present in the aortic position. Procedure Date: 01/03/2017. FINDINGS  Left Ventricle: Left ventricular ejection fraction, by estimation, is 55 to 60%. The left ventricle has normal function. There is mild left ventricular hypertrophy. Right Ventricle: The right ventricular size is normal. Right ventricular systolic function is mildly reduced. Mitral Valve: The mitral valve is degenerative in appearance. There is moderate calcification of the mitral valve leaflet(s). Severe mitral annular  calcification. Mild to moderate mitral valve regurgitation. Tricuspid Valve: The tricuspid valve is grossly normal. Tricuspid valve regurgitation is mild to moderate. Aortic Valve: Unable to assess TAVR valve hemodynamically due to limited acoustic windows for imaging. Valve is grossly normal in appearance with trivial central prosthetic AI. No rocking or dehiscence. The aortic valve has been repaired/replaced. Aortic  valve regurgitation is trivial. There is La Shehan 23 mm Sapien prosthetic, stented (TAVR) valve present in the aortic position. Procedure Date: 01/03/2017. Pulmonic Valve: The pulmonic valve was grossly normal. Pulmonic valve regurgitation is mild to moderate. LEFT VENTRICLE PLAX 2D LVIDd:         3.20 cm LVIDs:         2.17 cm LV PW:         0.90 cm LV IVS:        1.10 cm LVOT diam:      2.20 cm LVOT Area:     3.80 cm  LEFT ATRIUM         Index LA diam:    2.70 cm 1.99 cm/m  PULMONIC VALVE PV Vmax:          1.97 m/s PV Peak grad:     15.5 mmHg PR End Diast Vel: 7.84 msec  TRICUSPID VALVE TR Peak grad:   23.8 mmHg TR Vmax:        244.00 cm/s  SHUNTS Systemic Diam: 2.20 cm Cherlynn Kaiser MD Electronically signed by Cherlynn Kaiser MD Signature Date/Time: 01/15/2021/11:27:15 AM    Final    [place] EKG SITE RITE  Result Date: 01/15/2021 If Site Rite image not attached, placement could not be confirmed due to current cardiac rhythm.       Scheduled Meds:  budesonide  0.5 mg Inhalation BID   Chlorhexidine Gluconate Cloth  6 each Topical Daily   cholecalciferol  1,000 Units Oral Daily   docusate sodium  100 mg Oral Daily   feeding supplement  237 mL Oral BID BM   fluticasone  2 spray Each Nare Daily   guaiFENesin  600 mg Oral BID   linaclotide  72 mcg Oral Q breakfast   loratadine  10 mg Oral Daily   pantoprazole  40 mg Oral BID   rosuvastatin  40 mg Oral Daily   sodium chloride flush  3 mL Intravenous Q12H   thiamine injection  100 mg Intravenous Daily   Continuous Infusions:  sodium chloride Stopped (01/15/21 1230)    ceFAZolin (ANCEF) IV 1 g (01/16/21 0417)   DOPamine Stopped (01/15/21 0904)   norepinephrine (LEVOPHED) Adult infusion Stopped (01/15/21 1713)     LOS: 2 days    Time spent: over 30 min    Fayrene Helper, MD [facility]   To contact the attending provider between 7A-7P or the covering provider during after hours 7P-7A, please log into the web site [email or web address] and access using universal [place] password for that web site. If you do not have the password, please call the hospital operator.  01/16/2021, 8:47 AM

## 2021-01-17 ENCOUNTER — Encounter (HOSPITAL_COMMUNITY): Payer: Self-pay | Admitting: Cardiology

## 2021-01-17 ENCOUNTER — Inpatient Hospital Stay (HOSPITAL_COMMUNITY): Payer: Medicare Other

## 2021-01-17 DIAGNOSIS — E43 Unspecified severe protein-calorie malnutrition: Secondary | ICD-10-CM | POA: Diagnosis not present

## 2021-01-17 DIAGNOSIS — R627 Adult failure to thrive: Secondary | ICD-10-CM | POA: Diagnosis not present

## 2021-01-17 LAB — BASIC METABOLIC PANEL
Anion gap: 12 (ref 5–15)
BUN: 9 mg/dL (ref 8–23)
CO2: 26 mmol/L (ref 22–32)
Calcium: 9.7 mg/dL (ref 8.9–10.3)
Chloride: 98 mmol/L (ref 98–111)
Creatinine, Ser: 0.55 mg/dL (ref 0.44–1.00)
GFR, Estimated: >60 mL/min (ref >60)
Glucose, Bld: 100 mg/dL — ABNORMAL HIGH (ref 70–99)
Potassium: 3.8 mmol/L (ref 3.5–5.1)
Sodium: 136 mmol/L (ref 135–145)

## 2021-01-17 LAB — ZINC: Zinc: 65 ug/dL (ref 44–115)

## 2021-01-17 LAB — CBC
HCT: 35.6 % — ABNORMAL LOW (ref 36.0–46.0)
Hemoglobin: 11.8 g/dL — ABNORMAL LOW (ref 12.0–15.0)
MCH: 30.1 pg (ref 26.0–34.0)
MCHC: 33.1 g/dL (ref 30.0–36.0)
MCV: 90.8 fL (ref 80.0–100.0)
Platelets: 148 10*3/uL — ABNORMAL LOW (ref 150–400)
RBC: 3.92 MIL/uL (ref 3.87–5.11)
RDW: 13.5 % (ref 11.5–15.5)
WBC: 8.5 10*3/uL (ref 4.0–10.5)
nRBC: 0 % (ref 0.0–0.2)

## 2021-01-17 LAB — MAGNESIUM: Magnesium: 1.9 mg/dL (ref 1.7–2.4)

## 2021-01-17 LAB — PHOSPHORUS: Phosphorus: 2.6 mg/dL (ref 2.5–4.6)

## 2021-01-17 NOTE — Evaluation (Signed)
Occupational Therapy Evaluation Patient Details Name: [name] MRN: [id number] DOB: Feb 06, 1929 Today's Date: 01/17/2021    History of Present Illness 87 y.o. female admitted on 01/15/21 for syncope and bradycardia.  Dx with PAF with significant pauses and is now s/p St. Jude dual chambar pacemaker on 01/15/21.  Pt also dx with FTT.  Pt with significant PMH of aortic stenosis s/p TAVR, CAD s/p CABG, diastolic dysfunction, fibromyalgia, HTN, MI, mitral regurgitation, PAF, pulmonary regurgitation, tricuspid regurgitation.   Clinical Impression   PTA patient requires supervision for mobility using RW from daughter, up to min assist for bathing but able to complete all other ADLs with supervision. Pt was admitted for above and limited by problem list below, including impaired balance, decreased activity tolerance, L UE PPM precautions, generalized weakness. Pt able to follow commands and engage, but requires increased time with poor recall and safety awareness.  She requires min assist +2 for transfers, max assist for bed mobility and up to total assist for ADLs (max UB, total LB).  She will benefit from continued OT services while admitted and after dc at SNF level to optimize return to PLOF and decrease burden of care. Will lfollow.     Follow Up Recommendations  SNF;Supervision/Assistance - 24 hour    Equipment Recommendations  Other (comment) (TBD)    Recommendations for Other Services       Precautions / Restrictions Precautions Precautions: ICD/Pacemaker Precaution Comments: pacemaker 01/15/21 Restrictions Weight Bearing Restrictions: Yes LUE Weight Bearing: Non weight bearing      Mobility Bed Mobility Overal bed mobility: Needs Assistance Bed Mobility: Supine to Sit     Supine to sit: Max assist     General bed mobility comments: assist for LB and trunk to EOB, scooting foward; cueing to maintain L PPM precautions    Transfers Overall transfer level: Needs  assistance Equipment used: Rolling walker (2 wheeled) Transfers: Sit to/from [facility] Sit to Stand: Min assist;+2 physical assistance Stand pivot transfers: Min assist;+2 physical assistance       General transfer comment: pt requires cues for hand placement as well as assistance to power up into standing, increased time and effort    Balance Overall balance assessment: Needs assistance Sitting-balance support: No upper extremity supported;Feet supported Sitting balance-[name] Scale: Fair     Standing balance support: Bilateral upper extremity supported Standing balance-[name] Scale: Poor Standing balance comment: minA and BUE support of RW                           ADL either performed or assessed with clinical judgement   ADL Overall ADL's : Needs assistance/impaired     Grooming: Moderate assistance;Sitting           Upper Body Dressing : Maximal assistance;Sitting Upper Body Dressing Details (indicate cue type and reason): to change gown Lower Body Dressing: Total assistance;+2 for safety/equipment;Sit to/from stand Lower Body Dressing Details (indicate cue type and reason): assist with socks, min assist +2 sit to stand Toilet Transfer: Minimal assistance;+2 for physical assistance;+2 for safety/equipment;Stand-pivot Toilet Transfer Details (indicate cue type and reason): simulated to recliner         Functional mobility during ADLs: Minimal assistance;Moderate assistance;+2 for physical assistance;+2 for safety/equipment General ADL Comments: limited by impaired balance, cognition, weakness, and L UE pacemaker precautions     Vision   Vision Assessment?: No apparent visual deficits     Perception     Praxis  Pertinent Vitals/Pain Pain Assessment: No/denies pain     Hand Dominance Right   Extremity/Trunk Assessment Upper Extremity Assessment Upper Extremity Assessment: LUE deficits/detail;Generalized weakness (hx of B  hand carpal tunnel) LUE Deficits / Details: limited due to PPM precautions   Lower Extremity Assessment Lower Extremity Assessment: Defer to PT evaluation   Cervical / Trunk Assessment Cervical / Trunk Assessment: Kyphotic   Communication Communication Communication: HOH   Cognition Arousal/Alertness: Awake/alert Behavior During Therapy: WFL for tasks assessed/performed Overall Cognitive Status: History of cognitive impairments - at baseline                                 General Comments: follows one-step commands with increased time, decreased awareness of deficits, safety   General Comments  VSS on RA    Exercises Exercises: General Lower Extremity General Exercises - Lower Extremity Gluteal Sets: AROM;Both;10 reps Long Arc Quad: AROM;Both;20 reps Hip Flexion/Marching: AROM;Both;20 reps   Shoulder Instructions      Home Living Family/patient expects to be discharged to:: Private residence Living Arrangements: Children Available Help at Discharge: Family;Available 24 hours/day Type of Home: House Home Access: Stairs to enter     Home Layout: One level     Bathroom Shower/Tub: Teacher, early years/pre: Standard     Home Equipment: Environmental consultant - 2 wheels;Bedside commode;Transport chair   Additional Comments: pt doesn't go into the bathroom, she uses her BSC and does sponge baths. Daughter helps wtih bathing and provides S for stairs. Pt uses transport chair when out of the house      Prior Functioning/Environment Level of Independence: Needs assistance  Gait / Transfers Assistance Needed: daughter usually supervises her when she is up on her feet, walks bent over with RW at home at baseline. ADL's / Homemaking Assistance Needed: assist getting to her back with bathing, otherwise dresses and toilets herself Communication / Swallowing Assistance Needed: very HOH          OT Problem List: Decreased strength;Decreased activity  tolerance;Impaired balance (sitting and/or standing);Decreased safety awareness;Decreased knowledge of use of DME or AE;Decreased knowledge of precautions;Impaired UE functional use      OT Treatment/Interventions: Self-care/ADL training;Therapeutic exercise;DME and/or AE instruction;Therapeutic activities;Patient/family education;Balance training    OT Goals(Current goals can be found in the care plan section) Acute Rehab OT Goals Patient Stated Goal: to improve strength and return to supervision level of mobility OT Goal Formulation: With patient/family Time For Goal Achievement: 01/31/21 Potential to Achieve Goals: Good  OT Frequency: Min 2X/week   Barriers to D/C:            Co-evaluation PT/OT/SLP Co-Evaluation/Treatment: Yes Reason for Co-Treatment: For patient/therapist safety   OT goals addressed during session: ADL's and self-care      AM-PAC OT "6 Clicks" Daily Activity     Outcome Measure Help from another person eating meals?: A Little Help from another person taking care of personal grooming?: A Lot Help from another person toileting, which includes using toliet, bedpan, or urinal?: Total Help from another person bathing (including washing, rinsing, drying)?: A Lot Help from another person to put on and taking off regular upper body clothing?: A Lot Help from another person to put on and taking off regular lower body clothing?: Total 6 Click Score: 11   End of Session Equipment Utilized During Treatment: Rolling walker Nurse Communication: Mobility status  Activity Tolerance: Patient tolerated treatment well Patient left: in  chair;with call bell/phone within reach;Other (comment) (with PT)  OT Visit Diagnosis: Other abnormalities of gait and mobility (R26.89);Muscle weakness (generalized) (M62.81)                Time: [id number] OT Time Calculation (min): 37 min Charges:  OT General Charges $OT Visit: 1 Visit OT Evaluation $OT Eval Moderate Complexity: 1  Mod  Jolaine Artist, OT Acute Rehabilitation Services Pager [phone] Office [phone]   Delight Stare 01/17/2021, 10:05 AM

## 2021-01-17 NOTE — Progress Notes (Signed)
Physical Therapy Treatment Patient Details Name: [name] MRN: [id number] DOB: 1929/04/29 Today's Date: 01/17/2021    History of Present Illness 87 y.o. female admitted on 01/15/21 for syncope and bradycardia.  Dx with PAF with significant pauses and is now s/p St. Jude dual chambar pacemaker on 01/15/21.  Pt also dx with FTT.  Pt with significant PMH of aortic stenosis s/p TAVR, CAD s/p CABG, diastolic dysfunction, fibromyalgia, HTN, MI, mitral regurgitation, PAF, pulmonary regurgitation, tricuspid regurgitation.    PT Comments    Pt continues to require physical assistance to perform all functional mobility tasks due to generalized weakness. Pt initially lethargic and disoriented, becomes more alert with activity. Pt remains at a high falls risk due to weakness and imbalance. Pt will benefit from aggressive mobilization to improve activity tolerance and reduce falls risk. PT continues to recommend SNF placement.   Follow Up Recommendations  SNF     Equipment Recommendations  None recommended by PT    Recommendations for Other Services       Precautions / Restrictions Precautions Precautions: ICD/Pacemaker Precaution Comments: pacemaker 01/15/21 Restrictions Weight Bearing Restrictions: Yes LUE Weight Bearing: Non weight bearing    Mobility  Bed Mobility Overal bed mobility: Needs Assistance Bed Mobility: Supine to Sit     Supine to sit: Max assist     General bed mobility comments: significant assist to scoot toward edge of bed, cues to maintain PPM precautions    Transfers Overall transfer level: Needs assistance Equipment used: Rolling walker (2 wheeled) Transfers: Sit to/from [facility] Sit to Stand: Min assist;+2 physical assistance Stand pivot transfers: Min assist;+2 physical assistance       General transfer comment: pt requires cues for hand placement as well as assistance to power up into standing  Ambulation/Gait                  Stairs             Wheelchair Mobility    Modified Rankin (Stroke Patients Only)       Balance Overall balance assessment: Needs assistance Sitting-balance support: No upper extremity supported;Feet supported Sitting balance-[name] Scale: Fair     Standing balance support: Bilateral upper extremity supported Standing balance-[name] Scale: Poor Standing balance comment: minA and BUE support of RW                            Cognition Arousal/Alertness: Awake/alert Behavior During Therapy: WFL for tasks assessed/performed Overall Cognitive Status: History of cognitive impairments - at baseline                                 General Comments: follows one-step commands with increased time      Exercises General Exercises - Lower Extremity Gluteal Sets: AROM;Both;10 reps Long Arc Quad: AROM;Both;20 reps Hip Flexion/Marching: AROM;Both;20 reps    General Comments General comments (skin integrity, edema, etc.): VSS on RA      Pertinent Vitals/Pain Pain Assessment: No/denies pain    Home Living                      Prior Function            PT Goals (current goals can now be found in the care plan section) Acute Rehab PT Goals Patient Stated Goal: to improve strength and return to supervision level of mobility  Progress towards PT goals: Not progressing toward goals - comment (remains limited by LE weakness)    Frequency    Min 3X/week (maintain 3x/week in an effort to progress toward home)      PT Plan Current plan remains appropriate    Co-evaluation PT/OT/SLP Co-Evaluation/Treatment: Yes Reason for Co-Treatment: For patient/therapist safety;To address functional/ADL transfers          AM-PAC PT "6 Clicks" Mobility   Outcome Measure  Help needed turning from your back to your side while in a flat bed without using bedrails?: A Lot Help needed moving from lying on your back to sitting on the side of a flat  bed without using bedrails?: A Lot Help needed moving to and from a bed to a chair (including a wheelchair)?: Total Help needed standing up from a chair using your arms (e.g., wheelchair or bedside chair)?: Total Help needed to walk in hospital room?: Total Help needed climbing 3-5 steps with a railing? : Total 6 Click Score: 8    End of Session   Activity Tolerance: Patient limited by fatigue Patient left: in chair;with call bell/phone within reach;with chair alarm set;with family/visitor present Nurse Communication: Mobility status PT Visit Diagnosis: Muscle weakness (generalized) (M62.81);Difficulty in walking, not elsewhere classified (R26.2)     Time: 0829-0900 PT Time Calculation (min) (ACUTE ONLY): 31 min  Charges:  $Therapeutic Activity: 8-22 mins                     Zenaida Niece, PT, DPT Acute Rehabilitation Pager: [phone]    Zenaida Niece 01/17/2021, 9:14 AM

## 2021-01-17 NOTE — Progress Notes (Addendum)
Progress Note  Patient Name: [name] Date of Encounter: 01/17/2021  Primary Cardiologist: Lauree Chandler, MD   Subjective   Feels improved has concerns about paying for this visit.  Inpatient Medications    Scheduled Meds:  apixaban  2.5 mg Oral BID   budesonide  0.5 mg Inhalation BID   Chlorhexidine Gluconate Cloth  6 each Topical Daily   cholecalciferol  1,000 Units Oral Daily   docusate sodium  100 mg Oral Daily   feeding supplement  237 mL Oral BID BM   fluticasone  2 spray Each Nare Daily   guaiFENesin  600 mg Oral BID   linaclotide  72 mcg Oral Q breakfast   loratadine  10 mg Oral Daily   metoprolol tartrate  25 mg Oral BID   pantoprazole  40 mg Oral BID   rosuvastatin  40 mg Oral Daily   sodium chloride flush  3 mL Intravenous Q12H   thiamine injection  100 mg Intravenous Daily   Continuous Infusions:  sodium chloride Stopped (01/15/21 1230)   PRN Meds: acetaminophen, albuterol, ALPRAZolam, diazepam, guaiFENesin, nitroGLYCERIN, ondansetron (ZOFRAN) IV, polyethylene glycol, zolpidem   Vital Signs    Vitals:   01/16/21 2300 01/17/21 0344 01/17/21 0555 01/17/21 0914  BP: (!) 89/67 (!) 147/91 (!) 148/59 95/61  Pulse: 70 78 84 85  Resp: '18 17 19   '$ Temp: 98 F (36.7 C) 97.7 F (36.5 C)    TempSrc: Oral Oral    SpO2: 97% 98% 100%   Weight:  40.6 kg    Height:        Intake/Output Summary (Last 24 hours) at 01/17/2021 1007 Last data filed at 01/16/2021 1600 Gross per 24 hour  Intake 600 ml  Output 250 ml  Net 350 ml   Filed Weights   01/15/21 0450 01/16/21 0500 01/17/21 0344  Weight: 40.7 kg 39.6 kg 40.6 kg    Telemetry    Apparent sinus and AF with intermittent V paciing - Personally Reviewed  ECG    No new- Personally Reviewed  Physical Exam   GEN: No acute distress.  Frail and cachetic Neck: No JVD Cardiac: IRIR, systolic crescendo murmur  Respiratory: Clear to auscultation bilaterally. GI: Soft, slightly tender RLQ with no  rebound MS: No edema; No pain a RIV infiltration site, Device site without hematoma Neuro:  Nonfocal  Psych: Normal affect   Labs    Chemistry Recent Labs  Lab 01/15/21 0607 01/15/21 1213 01/16/21 0234 01/16/21 2125 01/17/21 0437  NA 139  --  135  --  136  K 2.8* 3.2* 4.3  --  3.8  CL 102  --  105  --  98  CO2 25  --  21*  --  26  GLUCOSE 114*  --  83  --  100*  BUN 8  --  9  --  9  CREATININE 0.51  --  0.63  --  0.55  CALCIUM 9.8  --  9.2  --  9.7  PROT 7.1  --   --  5.7*  --   ALBUMIN 4.0  --   --  3.3*  --   AST 18  --   --  23  --   ALT 15  --   --  8  --   ALKPHOS 41  --   --  39  --   BILITOT 1.1  --   --  1.0  --   GFRNONAA >60  --  >  60  --  >60  ANIONGAP 12  --  9  --  12     Hematology Recent Labs  Lab 01/14/21 2040 01/16/21 0234 01/17/21 0437  WBC 8.5 8.3 8.5  RBC 4.29  4.25 3.69* 3.92  HGB 12.8 10.9* 11.8*  HCT 38.9 33.5* 35.6*  MCV 90.7 90.8 90.8  MCH 29.8 29.5 30.1  MCHC 32.9 32.5 33.1  RDW 13.4 13.6 13.5  PLT 180 140* 148*    Cardiac EnzymesNo results for input(s): TROPONINI in the last 168 hours. No results for input(s): TROPIPOC in the last 168 hours.   BNPNo results for input(s): BNP, PROBNP in the last 168 hours.   DDimer No results for input(s): DDIMER in the last 168 hours.   Radiology    DG Chest 2 View  Result Date: 01/16/2021 CLINICAL DATA:  Pacemaker placement. EXAM: CHEST - 2 VIEW COMPARISON:  January 14, 2021. FINDINGS: The heart size and mediastinal contours are within normal limits. Both lungs are clear. Status post coronary bypass graft and transcatheter aortic valve repair. Interval placement of left-sided pacemaker with leads in grossly good position. Pneumothorax is noted. Minimal right pleural effusion may be present. The visualized skeletal structures are unremarkable. IMPRESSION: Interval placement of left-sided pacemaker. Minimal right pleural effusion. Aortic Atherosclerosis (ICD10-I70.0). Electronically Signed   By:  Marijo Conception M.D.   On: 01/16/2021 08:52   CT CHEST ABDOMEN PELVIS W CONTRAST  Result Date: 01/16/2021 CLINICAL DATA:  failure to thrive, early satiety EXAM: CT CHEST, ABDOMEN, AND PELVIS WITH CONTRAST TECHNIQUE: Multidetector CT imaging of the chest, abdomen and pelvis was performed following the standard protocol during bolus administration of intravenous contrast. CONTRAST:  60m OMNIPAQUE IOHEXOL 350 MG/ML SOLN COMPARISON:  April 22, 2020, November 01, 2016, July 25, 2003. FINDINGS: CT CHEST FINDINGS Cardiovascular: Heart is normal in size. Status post TAVR. Status post CABG. Atherosclerotic calcifications of the aorta and coronary arteries. No pericardial effusion. Mediastinum/Nodes: Multiple subcentimeter thyroid nodules, for which no specific follow-up is recommended. No mediastinal adenopathy. Lungs/Pleura: No pleural effusion or pneumothorax. Irregular partially calcified RIGHT apical scarring, grossly similar since 2005. Irregular 7 x 2 mm LEFT apical nodular opacity is unchanged since 2005, consistent with a benign etiology (series 4, image 31). Scattered endobronchial debris. Scattered calcified pulmonary nodules. 2 mm RIGHT lower lobe pulmonary nodule, stable since 2018, consistent with a benign etiology (series 4, image 100). Musculoskeletal: In the LEFT chest wall, there is subcutaneous air and a small amount of non organized fluid as well as LEFT inferior breast superficial edema. Streak artifact from pacemaker limits evaluation. This is most consistent with recent pacemaker insertion. CT ABDOMEN PELVIS FINDINGS Hepatobiliary: Revisualization of multiple gallstones within the gallbladder. There are multiple high density foci within the common bile duct, similar in comparison to prior. Common bile duct remains mildly dilated at 10 mm. No intrahepatic biliary ductal dilation. Coarse calcification of the LEFT liver, consistent with sequela of remote prior trauma or granulomatous infection.  Pancreas: Unchanged mild prominence of the pancreatic duct. No peripancreatic fat stranding. Spleen: Innumerable tiny hypodense lesions of the spleen, nonspecific. Adrenals/Urinary Tract: Adrenal glands are unchanged mildly prominent in appearance. No hydronephrosis. Exophytic RIGHT renal cyst. Subcentimeter hypodense lesions are too small to accurately characterize. Bladder is unremarkable. Stomach/Bowel: No evidence of bowel obstruction. There are multiple varices in the stomach wall with early hyperenhancement of the prominent stomach folds. Diverticulosis. No evidence of appendicitis. Vascular/Lymphatic: Atherosclerotic calcifications of the nonaneurysmal aorta. Mild ectasia of the infrarenal  abdominal aorta, unchanged in comparison to prior (no follow-up is recommended.). Reproductive: Status post hysterectomy. No adnexal masses. Other: No free air or free fluid.  Fat containing inguinal hernias. Musculoskeletal: Thoracolumbar scoliosis with marked levocurvature at the thoracolumbar junction. IMPRESSION: 1. Revisualization of multiple choledocholithiasis with mild dilation of the common bile duct. Recommend correlation with liver function tests. 2. There is early mucosal hyperenhancement of the smoothly thickened gastric folds with suggestion of underlying gastric varices. Findings are nonspecific and could reflect gastritis in the appropriate clinical setting. 3. Postsurgical changes of the LEFT chest wall adjacent to the pacemaker. Aortic Atherosclerosis (ICD10-I70.0). Electronically Signed   By: Valentino Saxon M.D.   On: 01/16/2021 19:48   EP PPM/ICD IMPLANT  Result Date: 01/15/2021 SURGEON:  Allegra Lai, MD   PREPROCEDURE DIAGNOSIS:  atrial fibrillation, heart block   POSTPROCEDURE DIAGNOSIS:  atrial fibrillation, heart block    PROCEDURES:  1. Pacemaker implantation.   INTRODUCTION:  DANILAH SIELAFF is a 87 y.o. female with a history of bradycardia who presents today for pacemaker implantation.  The  patient reports intermittent episodes of dizziness over the past few months.  No reversible causes have been identified.  The patient therefore presents today for pacemaker implantation.   DESCRIPTION OF PROCEDURE:  Informed written consent was obtained, and  the patient was brought to the electrophysiology lab in a fasting state.  The patient required no sedation for the procedure today.  The patients left chest was prepped and draped in the usual sterile fashion by the EP lab staff. The skin overlying the left deltopectoral region was infiltrated with lidocaine for local analgesia.  A 4-cm incision was made over the left deltopectoral region.  A left subcutaneous pacemaker pocket was fashioned using a combination of sharp and blunt dissection. Electrocautery was required to assure hemostasis.  RA/RV Lead Placement: The left axillary vein was therefore cannulated.  Through the left axillary vein, a [employer] model Tendril MRI [id number] (serial number  [id number]) right atrial lead and an [employer] model Tendril MRI [id number] (serial number  [id number]) right ventricular lead were advanced with fluoroscopic visualization into the right atrial appendage and right ventricular apex positions respectively.  Initial atrial lead P- waves measured 3.4 mV with impedance of 430 ohms in atrial fibrillation.  Right ventricular lead was dependent with an impedance of 650 ohms and a threshold of 0.5 V at 0.5 msec.  Both leads were secured to the pectoralis fascia using #2-0 silk over the suture sleeves. Device Placement:  The leads were then connected to an Gantt MRI  model [id number] (serial number  [id number] ) pacemaker.  The pocket was irrigated with copious gentamicin solution.  The pacemaker was then placed into the pocket.  The pocket was then closed in 3 layers with 2.0 Vicryl suture for the 3.0 Vicryl suture subcutaneous and subcuticular layers.  Steri-  Strips and a sterile dressing were then applied.  EBL<60m.  There were no early apparent complications.   CONCLUSIONS:  1. Successful implantation of a [employer] Assurity MRI dual-chamber pacemaker for symptomatic bradycardia  2. No early apparent complications.       WAllegra Lai MD 01/15/2021 5:28 PM   ECHOCARDIOGRAM LIMITED  Result Date: 01/15/2021    ECHOCARDIOGRAM LIMITED REPORT   Patient Name:   MSTEPHNEY RUZICHDate of Exam: 01/15/2021 Medical Rec #:  [id number]    Height:       62.0 in Accession #:    [id number]  Weight:       89.7 lb Date of Birth:  [birth year]    BSA:          1.360 m Patient Age:    40 years      BP:           119/44 mmHg Patient Gender: F             HR:           30 bpm. Exam Location:  Inpatient Procedure: Limited Echo, Cardiac Doppler and Color Doppler Indications:    Post TAVR evaluation Z95.2  History:        Patient has prior history of Echocardiogram examinations, most                 recent 02/26/2020. CAD and Previous Myocardial Infarction, Prior                 CABG, Carotid Disease, Aortic Valve Disease, Arrythmias:Atrial                 Fibrillation and LBBB, Signs/Symptoms:Hypotension, Murmur and                 Syncope; Risk Factors:Hypertension and Dyslipidemia. GERD.                 Aortic Valve: 23 mm Sapien prosthetic, stented (TAVR) valve is                 present in the aortic position. Procedure Date: 01/03/2017.  Sonographer:    Darlina Sicilian RDCS Referring Phys: 66 RHONDA G BARRETT  Sonographer Comments: Externally paced at 30 bpm. Limited Apical and Subcostal views due to external pacing. IMPRESSIONS  1. Left ventricular ejection fraction, by estimation, is 55 to 60%. The left ventricle has normal function. There is mild left ventricular hypertrophy.  2. Right ventricular systolic function is mildly reduced. The right ventricular size is normal.  3. The mitral valve is degenerative. Mild to moderate mitral valve regurgitation. Severe mitral annular calcification.  4. Tricuspid valve regurgitation is  mild to moderate.  5. Unable to assess TAVR valve hemodynamically due to limited acoustic windows for imaging. Valve is grossly normal in appearance with trivial central prosthetic AI. No rocking or dehiscence.. The aortic valve has been repaired/replaced. Aortic valve regurgitation is trivial. There is a 23 mm Sapien prosthetic (TAVR) valve present in the aortic position. Procedure Date: 01/03/2017. FINDINGS  Left Ventricle: Left ventricular ejection fraction, by estimation, is 55 to 60%. The left ventricle has normal function. There is mild left ventricular hypertrophy. Right Ventricle: The right ventricular size is normal. Right ventricular systolic function is mildly reduced. Mitral Valve: The mitral valve is degenerative in appearance. There is moderate calcification of the mitral valve leaflet(s). Severe mitral annular calcification. Mild to moderate mitral valve regurgitation. Tricuspid Valve: The tricuspid valve is grossly normal. Tricuspid valve regurgitation is mild to moderate. Aortic Valve: Unable to assess TAVR valve hemodynamically due to limited acoustic windows for imaging. Valve is grossly normal in appearance with trivial central prosthetic AI. No rocking or dehiscence. The aortic valve has been repaired/replaced. Aortic  valve regurgitation is trivial. There is a 23 mm Sapien prosthetic, stented (TAVR) valve present in the aortic position. Procedure Date: 01/03/2017. Pulmonic Valve: The pulmonic valve was grossly normal. Pulmonic valve regurgitation is mild to moderate. LEFT VENTRICLE PLAX 2D LVIDd:         3.20 cm LVIDs:         2.17  cm LV PW:         0.90 cm LV IVS:        1.10 cm LVOT diam:     2.20 cm LVOT Area:     3.80 cm  LEFT ATRIUM         Index LA diam:    2.70 cm 1.99 cm/m  PULMONIC VALVE PV Vmax:          1.97 m/s PV Peak grad:     15.5 mmHg PR End Diast Vel: 7.84 msec  TRICUSPID VALVE TR Peak grad:   23.8 mmHg TR Vmax:        244.00 cm/s  SHUNTS Systemic Diam: 2.20 cm Cherlynn Kaiser MD Electronically signed by Cherlynn Kaiser MD Signature Date/Time: 01/15/2021/11:27:15 AM    Final    US Abdomen Limited RUQ (LIVER/GB)  Result Date: 01/17/2021 CLINICAL DATA:  History of abnormal CT. History of choledocholithiasis and cholelithiasis EXAM: ULTRASOUND ABDOMEN LIMITED RIGHT UPPER QUADRANT COMPARISON:  January 16, 2021 FINDINGS: Gallbladder: Limited assessment due to inability to optimally position patient. There are multiple cholelithiasis. Bladder wall thickness measures within normal limits at 2 mm. No pericholecystic fluid visualized. Patient with altered mental status; no sonographic Murphy sign noted by sonographer, but evaluation is limited due to patient mentation. Common bile duct: Diameter: Dilated at least 10 mm. Known choledocholithiasis are not well visualized. Liver: Limited assessment of the liver due to inability to optimally position patient. No focal lesion identified. Mildly increased parenchymal echogenicity. Portal vein is patent on color Doppler imaging with normal direction of blood flow towards the liver. Other: Partial visualization an exophytic RIGHT renal cyst, better evaluated on CT IMPRESSION: 1. Cholelithiasis with dilation of the common bile duct, as seen on CT. Known choledocholithiasis are not well visualized sonographically. No definitive sonographic evidence of acute cholecystitis, but evaluation is limited secondary to patient inability to cooperate with exam. If persistent clinical concern for acute cholelithiasis or obstructing choledocholithiasis, HIDA scan would be useful in delineating cystic duct and common bile duct patency. 2. Mildly increased hepatic echogenicity as can be seen in hepatic steatosis. Electronically Signed   By: Valentino Saxon M.D.   On: 01/17/2021 10:03    Cardiac Studies   ECHO: 02/26/2020  1. Left ventricular ejection fraction, by estimation, is 55 to 60%. The  left ventricle has normal function. The left ventricle has  no regional  wall motion abnormalities. There is mild asymmetric left ventricular  hypertrophy of the basal-septal segment.  Left ventricular diastolic function could not be evaluated.   2. Right ventricular systolic function is normal. The right ventricular  size is normal. There is mildly elevated pulmonary artery systolic  pressure. The estimated right ventricular systolic pressure is [id number] mmHg.   3. The mitral valve is degenerative. Mild mitral valve regurgitation. No evidence of mitral stenosis.   4. 23 mm S3 TAVR. V max 2.1 m/s, MG 8.4 mmHG, AVA 1.88 cm2, DI 0.60. No regurgitation or paravalvular leak. Normal prosthesis. The aortic valve has been repaired/replaced. Aortic valve regurgitation is not visualized. There is a 23 mm Sapien prosthetic  (TAVR) valve present in the aortic position. Procedure Date: 01/03/2017.   5. The inferior vena cava is normal in size with greater than 50%  respiratory variability, suggesting right atrial pressure of 3 mmHg.   Comparison(s): No significant change from prior study. Stable EF. Stable TAVR gradients.    Cardiac cath: 10/27/2016 Ost Cx to Prox Cx lesion, 50 %stenosed. Ost 2nd Mrg  to 2nd Mrg lesion, 10 %stenosed. SVG graft was visualized by angiography and is normal in caliber. Mid RCA to Dist RCA lesion, 100 %stenosed. SVG graft was visualized by angiography and is normal in caliber. SVG graft was visualized by angiography and is normal in caliber. Mid LAD lesion, 100 %stenosed. 1st Diag lesion, 50 %stenosed. LIMA graft was visualized by non-selective angiography and is small. Dist LAD lesion, 40 %stenosed.   1. Severe triple vessel CAD s/p 4V CABG with 3/4 patent bypass grafts 2. The mid LAD is chronically occluded. The vein graft to the mid LAD is patent 3. The proximal Circumflex has moderate stenosis. The mid Circumflex stent is patent. The vein graft to the second OM is patent 4. The mid RCA is chronically occluded. The vein graft to  the distal RCA is patent 5. The LIMA to the Diagonal is atretic. The Diagonal branch as moderate non-obstructive disease.  6. Severe aortic stenosis (mean gradient 41.5 mmHg, peak to peak gradient 41 mmHg, AVA 0.76cm2).    Recommendations: Will continue planning for TAVR. She will be referred to see the CT surgeons on our TAVR team and will need to have her pre op testing arranged   Patient Profile     87 y.o. female hx of CAD s/p prior PCI and 2006 CABG, PAF s/p 2006 MAZE, Severe AS s/p 2018 TAVR, prior hx of CVA and CAS s/p R CEA, HTN and HLD, prior history of 2nd HB and LBBB who presented for re-evaluation in the ED for second evaluation for SOB and weakness.  Had syncope associated with 6+s pauses.   Assessment & Plan    PAF with pauses  - s/p DC PPM - CHADVASC of 7; on back on eliquis  Labile BP - suspect this is related to intravascular volume depletion Off pressors CAD s/p CABG  HTN and HLD Prior CVA and CAS s/p CEA - No angina or evidence of MI, on AC as above  - DC home  imdur 15 mg PO daily -Continue rosuvastatin   Severe AS s/p TAVR - no sx at this time  Severe Malnutrition & Debility - diet in plan - getting work up for FFT with [facility] colleagues (CT appears largely unremarkable for etiology)  Constipation - on linzess  Hx of GI bleed, abdominal pain - TRH following; appreciate recs  Code Status:  Full; decision with patient and daugther  DVT PPX eliquis  Diet:Thin  Labs Ordered (including phos; baseline prior to feeding)  Full Code Suspect challenging Dispo   For questions or updates, please contact [facility] Please consult [email or web address] for contact info under Cardiology/STEMI.      Signed, Werner Lean, MD  01/17/2021, 10:07 AM

## 2021-01-17 NOTE — Progress Notes (Addendum)
Consult NOTE    [name]  [id number] DOB: [birth year] DOA: 01/14/2021 PCP: Denita Lung, MD   Chief Complaint  Patient presents with   Loss of Consciousness   Brief Narrative:  87 year old female with history of CAD status post CABG, status post TAVR, [name]. fib presents to the ER for the second time in last 24 hours.  Had come to the ER last night for shortness of breath work-up was unremarkable was discharged home.  This morning patient had 2 syncopal episode once while in the bed and second 1 while on the table eating breakfast.  Lasted for few seconds and regained consciousness.  Did not complain of any chest pain.  Was brought to the ER.  Was found to have [name]. fib with slow responses and 6-second pauses.  Cardiology was notified.  Hospitalist was consulted for patient having poor appetite early satiety failure to thrive.  Labs show hemoglobin 11.7 high sensitive troponin was 16 and 19.  Metabolic panel largely unremarkable.  Assessment & Plan:   Active Problems:   Hx of CABG 2006 with [facility]   Atrial fibrillation with slow ventricular response (HCC)   Sinus pause   Syncope   Anemia   Weakness   Protein-calorie malnutrition, severe  [name]. fib with slow ventricular response and pauses  Hypotension Off pressors EP planning for pacemaker - s/p pacemaker implantation 8/26 Per cardiology who is primary - eliquis being resumed  Failure to thrive  Early Satiety  Early satiety for the past several weeks - months Will plan for CT chest, abdomen, pelvis twith multible cholodocholithiasis with mild dilation of the CBD, suggestion of gastric varices (gastritis?) RUQ [place] with cholelithiasis - no evidence of acute cholecystitsi, increased echogenicity Will continue to monitor, labs do no suggest obstruction - chronic findings of choledocholithiasis TSH wnl, cortisol.  Iron, folate, B12, ferritin wnl.  Vitamin C, b1, zinc  (wnl)ordered by RD, appreciate dietitian assistance  Will need  continued follow up outpatient   Anemia with history of GI bleed in December 2021 suspected to be likely from diverticular.  Follow CBC.  History of CAD status post CABG  hx Severe AS s/p TAVR  Hx CVA and CAS s/p CEA Noted Continue rosuvastatin  Hypokalemia  improved  DVT prophylaxis: SCD Code Status: full Family Communication daughter at bedside 8/27 Disposition:  Per primary team, cardiology  Procedures:  Echo IMPRESSIONS     1. Left ventricular ejection fraction, by estimation, is 55 to 60%. The  left ventricle has normal function. There is mild left ventricular  hypertrophy.   2. Right ventricular systolic function is mildly reduced. The right  ventricular size is normal.   3. The mitral valve is degenerative. Mild to moderate mitral valve  regurgitation. Severe mitral annular calcification.   4. Tricuspid valve regurgitation is mild to moderate.   5. Unable to assess TAVR valve hemodynamically due to limited acoustic  windows for imaging. Valve is grossly normal in appearance with trivial  central prosthetic AI. No rocking or dehiscence.. The aortic valve has  been repaired/replaced. Aortic valve  regurgitation is trivial. There is [name] 23 mm Sapien prosthetic (TAVR) valve  present in the aortic position. Procedure Date: 01/03/2017.   Antimicrobials:  Anti-infectives (From admission, onward)    Start     Dose/Rate Route Frequency Ordered Stop   01/15/21 2230  ceFAZolin (ANCEF) IVPB 1 g/50 mL premix        1 g 100 mL/hr over 30 Minutes Intravenous Every 6  hours 01/15/21 1823 01/16/21 1049   01/15/21 1600  gentamicin (GARAMYCIN) 80 mg in sodium chloride 0.9 % 500 mL irrigation        80 mg Irrigation To [facility]Stay [facility] 01/15/21 1131 01/15/21 1708   01/15/21 1600  ceFAZolin (ANCEF) IVPB 2g/100 mL premix        2 g 200 mL/hr over 30 Minutes Intravenous To ShortStay [facility] 01/15/21 1131 01/15/21 1655          Subjective: No new  complaints  Objective: Vitals:   01/17/21 0344 01/17/21 0555 01/17/21 0914 01/17/21 1440  BP: (!) 147/91 (!) 148/59 95/61 (!) 114/57  Pulse: 78 84 85 70  Resp: '17 19  20  '$ Temp: 97.7 F (36.5 C)   98 F (36.7 C)  TempSrc: Oral   Axillary  SpO2: 98% 100%  98%  Weight: 40.6 kg     Height:        Intake/Output Summary (Last 24 hours) at 01/17/2021 1543 Last data filed at 01/16/2021 1600 Gross per 24 hour  Intake 500 ml  Output --  Net 500 ml   Filed Weights   01/15/21 0450 01/16/21 0500 01/17/21 0344  Weight: 40.7 kg 39.6 kg 40.6 kg    Examination:  General: No acute distress. Cardiovascular: RR. Lungs: unlabored Abdomen: Soft, nontender, nondistended Neurological: Alert and oriented 3. Moves all extremities 4 with equal strength. Cranial nerves II through XII grossly intact. Skin: Warm and dry. No rashes or lesions. Extremities: No clubbing or cyanosis. No edema.   Data Reviewed: I have personally reviewed following labs and imaging studies  CBC: Recent Labs  Lab 01/13/21 2142 01/14/21 1643 01/14/21 2040 01/16/21 0234 01/17/21 0437  WBC 4.5 6.4 8.5 8.3 8.5  NEUTROABS  --  5.4  --   --   --   HGB 12.1 11.7* 12.8 10.9* 11.8*  HCT 36.3 37.4 38.9 33.5* 35.6*  MCV 91.9 93.3 90.7 90.8 90.8  PLT 186 187 180 140* 148*    Basic Metabolic Panel: Recent Labs  Lab 01/13/21 2142 01/14/21 1643 01/15/21 0607 01/15/21 1213 01/16/21 0234 01/17/21 0437  NA 136 140 139  --  135 136  K 3.6 4.0 2.8* 3.2* 4.3 3.8  CL 99 104 102  --  105 98  CO2 '28 25 25  '$ --  21* 26  GLUCOSE 133* 153* 114*  --  83 100*  BUN '9 8 8  '$ --  9 9  CREATININE 0.53 0.54 0.51  --  0.63 0.55  CALCIUM 9.5 9.6 9.8  --  9.2 9.7  MG  --   --  2.1  --  1.8 1.9  PHOS  --   --   --   --  2.5 2.6    GFR: Estimated Creatinine Clearance: 30.6 mL/min (by C-G formula based on SCr of 0.55 mg/dL).  Liver Function Tests: Recent Labs  Lab 01/15/21 0607 01/16/21 2125  AST 18 23  ALT 15 8  ALKPHOS 41  39  BILITOT 1.1 1.0  PROT 7.1 5.7*  ALBUMIN 4.0 3.3*    CBG: Recent Labs  Lab 01/14/21 2027  GLUCAP 142*     Recent Results (from the past 240 hour(s))  Resp Panel by RT-PCR (Flu [name]&B, Covid) Nasopharyngeal Swab     Status: None   Collection Time: 01/14/21  5:03 PM   Specimen: Nasopharyngeal Swab; Nasopharyngeal(NP) swabs in vial transport medium  Result Value Ref Range Status   SARS Coronavirus 2 by RT PCR NEGATIVE NEGATIVE Final  Comment: (NOTE) SARS-CoV-2 target nucleic acids are NOT DETECTED.  The SARS-CoV-2 RNA is generally detectable in upper respiratory specimens during the acute phase of infection. The lowest concentration of SARS-CoV-2 viral copies this assay can detect is 138 copies/mL. Shonia Skilling negative result does not preclude SARS-Cov-2 infection and should not be used as the sole basis for treatment or other patient management decisions. Zorianna Taliaferro negative result may occur with  improper specimen collection/handling, submission of specimen other than nasopharyngeal swab, presence of viral mutation(s) within the areas targeted by this assay, and inadequate number of viral copies(<138 copies/mL). Murdis Flitton negative result must be combined with clinical observations, patient history, and epidemiological information. The expected result is Negative.  Fact Sheet for Patients:  EntrepreneurPulse.com.au  Fact Sheet for Healthcare Providers:  IncredibleEmployment.be  This test is no t yet approved or cleared by the [place] FDA and  has been authorized for detection and/or diagnosis of SARS-CoV-2 by FDA under an Emergency Use Authorization (EUA). This EUA will remain  in effect (meaning this test can be used) for the duration of the COVID-19 declaration under Section 564(b)(1) of the Act, 21 U.S.C.section 360bbb-3(b)(1), unless the authorization is terminated  or revoked sooner.       Influenza Ahmad Vanwey by PCR NEGATIVE NEGATIVE Final   Influenza B  by PCR NEGATIVE NEGATIVE Final    Comment: (NOTE) The Xpert Xpress SARS-CoV-2/FLU/RSV plus assay is intended as an aid in the diagnosis of influenza from Nasopharyngeal swab specimens and should not be used as Samyak Sackmann sole basis for treatment. Nasal washings and aspirates are unacceptable for Xpert Xpress SARS-CoV-2/FLU/RSV testing.  Fact Sheet for Patients: EntrepreneurPulse.com.au  Fact Sheet for Healthcare Providers: IncredibleEmployment.be  This test is not yet approved or cleared by the [place] FDA and has been authorized for detection and/or diagnosis of SARS-CoV-2 by FDA under an Emergency Use Authorization (EUA). This EUA will remain in effect (meaning this test can be used) for the duration of the COVID-19 declaration under Section 564(b)(1) of the Act, 21 U.S.C. section 360bbb-3(b)(1), unless the authorization is terminated or revoked.  Performed at [facility], [place] [address], [place], [place] [address]   SARS CORONAVIRUS 2 (TAT 6-24 HRS) Nasopharyngeal Nasopharyngeal Swab     Status: None   Collection Time: 01/14/21  6:53 PM   Specimen: Nasopharyngeal Swab  Result Value Ref Range Status   SARS Coronavirus 2 NEGATIVE NEGATIVE Final    Comment: (NOTE) SARS-CoV-2 target nucleic acids are NOT DETECTED.  The SARS-CoV-2 RNA is generally detectable in upper and lower respiratory specimens during the acute phase of infection. Negative results do not preclude SARS-CoV-2 infection, do not rule out co-infections with other pathogens, and should not be used as the sole basis for treatment or other patient management decisions. Negative results must be combined with clinical observations, patient history, and epidemiological information. The expected result is Negative.  Fact Sheet for Patients: SugarRoll.be  Fact Sheet for Healthcare Providers: https://www.woods-mathews.com/  This test is  not yet approved or cleared by the [place] FDA and  has been authorized for detection and/or diagnosis of SARS-CoV-2 by FDA under an Emergency Use Authorization (EUA). This EUA will remain  in effect (meaning this test can be used) for the duration of the COVID-19 declaration under Se ction 564(b)(1) of the Act, 21 U.S.C. section 360bbb-3(b)(1), unless the authorization is terminated or revoked sooner.  Performed at [facility], [place] [address], [place], [place] [address]   MRSA Next Gen by  PCR, Nasal     Status: None   Collection Time: 01/14/21  8:30 PM   Specimen: Nasal Mucosa; Nasal Swab  Result Value Ref Range Status   MRSA by PCR Next Gen NOT DETECTED NOT DETECTED Final    Comment: (NOTE) The GeneXpert MRSA Assay (FDA approved for NASAL specimens only), is one component of Clide Remmers comprehensive MRSA colonization surveillance program. It is not intended to diagnose MRSA infection nor to guide or monitor treatment for MRSA infections. Test performance is not FDA approved in patients less than 9 years old. Performed at [facility], [place] [address], [place], [place] [address]   Surgical PCR screen     Status: None   Collection Time: 01/15/21 11:45 AM   Specimen: Nasal Mucosa; Nasal Swab  Result Value Ref Range Status   MRSA, PCR NEGATIVE NEGATIVE Final   Staphylococcus aureus NEGATIVE NEGATIVE Final    Comment: (NOTE) The Xpert SA Assay (FDA approved for NASAL specimens in patients 70 years of age and older), is one component of Keilin Gamboa comprehensive surveillance program. It is not intended to diagnose infection nor to guide or monitor treatment. Performed at [facility], [place] [address], [place], [place] [address]          Radiology Studies: DG Chest 2 View  Result Date: 01/16/2021 CLINICAL DATA:  Pacemaker placement. EXAM: CHEST - 2 VIEW COMPARISON:  January 14, 2021. FINDINGS: The heart size and mediastinal contours are within normal limits. Both lungs  are clear. Status post coronary bypass graft and transcatheter aortic valve repair. Interval placement of left-sided pacemaker with leads in grossly good position. Pneumothorax is noted. Minimal right pleural effusion may be present. The visualized skeletal structures are unremarkable. IMPRESSION: Interval placement of left-sided pacemaker. Minimal right pleural effusion. Aortic Atherosclerosis (ICD10-I70.0). Electronically Signed   By: Marijo Conception M.D.   On: 01/16/2021 08:52   CT CHEST ABDOMEN PELVIS W CONTRAST  Result Date: 01/16/2021 CLINICAL DATA:  failure to thrive, early satiety EXAM: CT CHEST, ABDOMEN, AND PELVIS WITH CONTRAST TECHNIQUE: Multidetector CT imaging of the chest, abdomen and pelvis was performed following the standard protocol during bolus administration of intravenous contrast. CONTRAST:  90m OMNIPAQUE IOHEXOL 350 MG/ML SOLN COMPARISON:  April 22, 2020, November 01, 2016, July 25, 2003. FINDINGS: CT CHEST FINDINGS Cardiovascular: Heart is normal in size. Status post TAVR. Status post CABG. Atherosclerotic calcifications of the aorta and coronary arteries. No pericardial effusion. Mediastinum/Nodes: Multiple subcentimeter thyroid nodules, for which no specific follow-up is recommended. No mediastinal adenopathy. Lungs/Pleura: No pleural effusion or pneumothorax. Irregular partially calcified RIGHT apical scarring, grossly similar since 2005. Irregular 7 x 2 mm LEFT apical nodular opacity is unchanged since 2005, consistent with [place] benign etiology (series 4, image 31). Scattered endobronchial debris. Scattered calcified pulmonary nodules. 2 mm RIGHT lower lobe pulmonary nodule, stable since 2018, consistent with Brylee Mcgreal benign etiology (series 4, image 100). Musculoskeletal: In the LEFT chest wall, there is subcutaneous air and Kamare Caspers small amount of non organized fluid as well as LEFT inferior breast superficial edema. Streak artifact from pacemaker limits evaluation. This is most consistent with  recent pacemaker insertion. CT ABDOMEN PELVIS FINDINGS Hepatobiliary: Revisualization of multiple gallstones within the gallbladder. There are multiple high density foci within the common bile duct, similar in comparison to prior. Common bile duct remains mildly dilated at 10 mm. No intrahepatic biliary ductal dilation. Coarse calcification of the LEFT liver, consistent with sequela of remote prior trauma or granulomatous infection. Pancreas:  Unchanged mild prominence of the pancreatic duct. No peripancreatic fat stranding. Spleen: Innumerable tiny hypodense lesions of the spleen, nonspecific. Adrenals/Urinary Tract: Adrenal glands are unchanged mildly prominent in appearance. No hydronephrosis. Exophytic RIGHT renal cyst. Subcentimeter hypodense lesions are too small to accurately characterize. Bladder is unremarkable. Stomach/Bowel: No evidence of bowel obstruction. There are multiple varices in the stomach wall with early hyperenhancement of the prominent stomach folds. Diverticulosis. No evidence of appendicitis. Vascular/Lymphatic: Atherosclerotic calcifications of the nonaneurysmal aorta. Mild ectasia of the infrarenal abdominal aorta, unchanged in comparison to prior (no follow-up is recommended.). Reproductive: Status post hysterectomy. No adnexal masses. Other: No free air or free fluid.  Fat containing inguinal hernias. Musculoskeletal: Thoracolumbar scoliosis with marked levocurvature at the thoracolumbar junction. IMPRESSION: 1. Revisualization of multiple choledocholithiasis with mild dilation of the common bile duct. Recommend correlation with liver function tests. 2. There is early mucosal hyperenhancement of the smoothly thickened gastric folds with suggestion of underlying gastric varices. Findings are nonspecific and could reflect gastritis in the appropriate clinical setting. 3. Postsurgical changes of the LEFT chest wall adjacent to the pacemaker. Aortic Atherosclerosis (ICD10-I70.0).  Electronically Signed   By: Valentino Saxon M.D.   On: 01/16/2021 19:48   EP PPM/ICD IMPLANT  Result Date: 01/15/2021 SURGEON:  Allegra Lai, MD   PREPROCEDURE DIAGNOSIS:  atrial fibrillation, heart block   POSTPROCEDURE DIAGNOSIS:  atrial fibrillation, heart block    PROCEDURES:  1. Pacemaker implantation.   INTRODUCTION:  LEOMA BLAUSER is Jaimee Corum 87 y.o. female with Mindy Behnken history of bradycardia who presents today for pacemaker implantation.  The patient reports intermittent episodes of dizziness over the past few months.  No reversible causes have been identified.  The patient therefore presents today for pacemaker implantation.   DESCRIPTION OF PROCEDURE:  Informed written consent was obtained, and  the patient was brought to the electrophysiology lab in Kellis Topete fasting state.  The patient required no sedation for the procedure today.  The patients left chest was prepped and draped in the usual sterile fashion by the EP lab staff. The skin overlying the left deltopectoral region was infiltrated with lidocaine for local analgesia.  Marlayna Bannister 4-cm incision was made over the left deltopectoral region.  Kjirsten Bloodgood left subcutaneous pacemaker pocket was fashioned using Monserat Prestigiacomo combination of sharp and blunt dissection. Electrocautery was required to assure hemostasis.  RA/RV Lead Placement: The left axillary vein was therefore cannulated.  Through the left axillary vein, Brayden Betters [employer] model Tendril MRI [id number] (serial number  [id number]) right atrial lead and an [employer] model Tendril MRI [id number] (serial number  [id number]) right ventricular lead were advanced with fluoroscopic visualization into the right atrial appendage and right ventricular apex positions respectively.  Initial atrial lead P- waves measured 3.4 mV with impedance of 430 ohms in atrial fibrillation.  Right ventricular lead was dependent with an impedance of 650 ohms and Joud Pettinato threshold of 0.5 V at 0.5 msec.  Both leads were secured to the pectoralis fascia using #2-0 silk over  the suture sleeves. Device Placement:  The leads were then connected to an [place] MRI  model [id number] (serial number  [id number] ) pacemaker.  The pocket was irrigated with copious gentamicin solution.  The pacemaker was then placed into the pocket.  The pocket was then closed in 3 layers with 2.0 Vicryl suture for the 3.0 Vicryl suture subcutaneous and subcuticular layers.  Steri-  Strips and Kolby Schara sterile dressing were then applied. EBL<34m.  There were no early apparent complications.  CONCLUSIONS:  1. Successful implantation of Azyiah Bo [employer] Medical Assurity MRI dual-chamber pacemaker for symptomatic bradycardia  2. No early apparent complications.       Will Curt Bears, MD 01/15/2021 5:28 PM   US Abdomen Limited RUQ (LIVER/GB)  Result Date: 01/17/2021 CLINICAL DATA:  History of abnormal CT. History of choledocholithiasis and cholelithiasis EXAM: ULTRASOUND ABDOMEN LIMITED RIGHT UPPER QUADRANT COMPARISON:  January 16, 2021 FINDINGS: Gallbladder: Limited assessment due to inability to optimally position patient. There are multiple cholelithiasis. Bladder wall thickness measures within normal limits at 2 mm. No pericholecystic fluid visualized. Patient with altered mental status; no sonographic Murphy sign noted by sonographer, but evaluation is limited due to patient mentation. Common bile duct: Diameter: Dilated at least 10 mm. Known choledocholithiasis are not well visualized. Liver: Limited assessment of the liver due to inability to optimally position patient. No focal lesion identified. Mildly increased parenchymal echogenicity. Portal vein is patent on color Doppler imaging with normal direction of blood flow towards the liver. Other: Partial visualization an exophytic RIGHT renal cyst, better evaluated on CT IMPRESSION: 1. Cholelithiasis with dilation of the common bile duct, as seen on CT. Known choledocholithiasis are not well visualized sonographically. No definitive sonographic evidence of acute  cholecystitis, but evaluation is limited secondary to patient inability to cooperate with exam. If persistent clinical concern for acute cholelithiasis or obstructing choledocholithiasis, HIDA scan would be useful in delineating cystic duct and common bile duct patency. 2. Mildly increased hepatic echogenicity as can be seen in hepatic steatosis. Electronically Signed   By: Valentino Saxon M.D.   On: 01/17/2021 10:03        Scheduled Meds:  apixaban  2.5 mg Oral BID   budesonide  0.5 mg Inhalation BID   Chlorhexidine Gluconate Cloth  6 each Topical Daily   cholecalciferol  1,000 Units Oral Daily   docusate sodium  100 mg Oral Daily   feeding supplement  237 mL Oral BID BM   fluticasone  2 spray Each Nare Daily   guaiFENesin  600 mg Oral BID   linaclotide  72 mcg Oral Q breakfast   loratadine  10 mg Oral Daily   metoprolol tartrate  25 mg Oral BID   pantoprazole  40 mg Oral BID   rosuvastatin  40 mg Oral Daily   sodium chloride flush  3 mL Intravenous Q12H   thiamine injection  100 mg Intravenous Daily   Continuous Infusions:  sodium chloride Stopped (01/15/21 1230)     LOS: 3 days    Time spent: over 30 min    Fayrene Helper, MD [facility]   To contact the attending provider between 7A-7P or the covering provider during after hours 7P-7A, please log into the web site [email or web address] and access using universal [place] password for that web site. If you do not have the password, please call the hospital operator.  01/17/2021, 3:43 PM

## 2021-01-17 NOTE — TOC Initial Note (Signed)
Transition of Care [facility]) - Initial/Assessment Note    Patient Details  Name: [name] MRN: [id number] Date of Birth: 23-Apr-1929  Transition of Care [facility]) CM/SW Contact:    Bary Castilla, LCSW Phone Number: [phone] 01/17/2021, 12:45 PM  Clinical Narrative:                  CSW met with patient and pt's daughter [name] to discuss PT recommendation of a SNF. Patient and [name] was aware of recommendation and in agreement with going to a ST SNF. CSW discussed the SNF process.CSW provided patient and [name] with medicare.gov rating list.  Patient gave CSW permission to fax referrals out to local facilities.CSW answered questions about the SNF process and the next steps in the process.  CSW inquired about additional resources that may be needed for food and [name] informed CSW that she was receiving food stamps as of now therefore they were fine.   Patient has been vaccinated however not boostered.  TOC team will continue to assist with discharge planning needs.   Expected Discharge Plan: Skilled Nursing Facility Barriers to Discharge: SNF Pending bed offer, Continued Medical Work up   Patient Goals and CMS Choice Patient states their goals for this hospitalization and ongoing recovery are:: To be able to walk on my own CMS Medicare.gov Compare Post Acute Care list provided to:: Patient Choice offered to / list presented to : Patient  Expected Discharge Plan and Services Expected Discharge Plan: [place] arrangements for the past 2 months: Single Family Home                                      Prior Living Arrangements/Services Living arrangements for the past 2 months: Single Family Home Lives with:: Self, Adult Children Patient language and need for interpreter reviewed:: Yes          Care giver support system in place?: Yes (comment)      Activities of Daily Living      Permission Sought/Granted Permission sought  to share information with : Family Supports Permission granted to share information with : Yes, Release of Information Signed  Share Information with NAME: [name]  Permission granted to share info w AGENCY: SNFs  Permission granted to share info w Relationship: Daughter  Permission granted to share info w Contact Information: [phone]  Emotional Assessment Appearance:: Appears stated age Attitude/Demeanor/Rapport: Engaged Affect (typically observed): Accepting, Adaptable Orientation: : Oriented to Self, Oriented to Place, Oriented to  Time, Oriented to Situation      Admission diagnosis:  Atrial fibrillation with slow ventricular response (HCC) [I48.91] Sinus pause [I45.5] Syncope, unspecified syncope type [R55] Patient Active Problem List   Diagnosis Date Noted   Protein-calorie malnutrition, severe 01/16/2021   Atrial fibrillation with slow ventricular response ([place]) 01/14/2021   Sinus pause 01/14/2021   Syncope 01/14/2021   Anemia 01/14/2021   Weakness 01/14/2021   GI bleed 04/22/2020   Presbycusis of both ears 11/13/2019   Vertigo 03/27/2017   Arthritis 03/27/2017   Stroke ([place])    Status post transcatheter aortic valve replacement (TAVR) using bioprosthesis 01/03/2017   Chronic apical periodontitis 12/28/2016   Retained dental roots 12/28/2016   Bilateral maxillary lateral exostoses 78/24/2353   Diastolic dysfunction    Chronic stable angina (HCC)    Tricuspid regurgitation    Pulmonary regurgitation  Left shoulder pain    Back pain-similar to pt's angina 12/01/2015   Carotid artery disease- s/p RCE 12/01/2015   Bowen's disease 03/16/2015   Dependent edema 10/30/2014   History of allergy to aspirin 06/02/2014   PAF (paroxysmal atrial fibrillation) (HCC)    Gastroesophageal reflux disease without esophagitis 12/05/2011   Hx of CABG 2006 with Maze 07/21/2011   Mitral regurgitation 07/21/2011   Allergic rhinitis 10/05/2010   Hyperlipidemia 22/24/1146    Uncomplicated asthma 43/14/2767   PCP:  Denita Lung, MD Pharmacy:   [facility], [place] [address] Phone: [phone] Fax: [phone]  [facility] [place], [place] - [address] [place] [address] Phone: [phone] Fax: [place], [place] - [address] AT [place] [address] [place] [address] Phone: [phone] Fax: [phone]     Social Determinants of Health (SDOH) Interventions    Readmission Risk Interventions No flowsheet data found.

## 2021-01-18 DIAGNOSIS — I495 Sick sinus syndrome: Secondary | ICD-10-CM | POA: Diagnosis not present

## 2021-01-18 DIAGNOSIS — R110 Nausea: Secondary | ICD-10-CM

## 2021-01-18 LAB — PHOSPHORUS: Phosphorus: 2.8 mg/dL (ref 2.5–4.6)

## 2021-01-18 LAB — HEPATIC FUNCTION PANEL
ALT: <5 U/L (ref 0–44)
AST: 18 U/L (ref 15–41)
Albumin: 3.2 g/dL — ABNORMAL LOW (ref 3.5–5.0)
Alkaline Phosphatase: 38 U/L (ref 38–126)
Bilirubin, Direct: <0.1 mg/dL (ref 0.0–0.2)
Total Bilirubin: 0.6 mg/dL (ref 0.3–1.2)
Total Protein: 6.1 g/dL — ABNORMAL LOW (ref 6.5–8.1)

## 2021-01-18 LAB — CBC
HCT: 36.2 % (ref 36.0–46.0)
Hemoglobin: 12.1 g/dL (ref 12.0–15.0)
MCH: 30.1 pg (ref 26.0–34.0)
MCHC: 33.4 g/dL (ref 30.0–36.0)
MCV: 90 fL (ref 80.0–100.0)
Platelets: 185 10*3/uL (ref 150–400)
RBC: 4.02 MIL/uL (ref 3.87–5.11)
RDW: 13.6 % (ref 11.5–15.5)
WBC: 9 10*3/uL (ref 4.0–10.5)
nRBC: 0 % (ref 0.0–0.2)

## 2021-01-18 LAB — BASIC METABOLIC PANEL
Anion gap: 7 (ref 5–15)
BUN: 10 mg/dL (ref 8–23)
CO2: 28 mmol/L (ref 22–32)
Calcium: 9.5 mg/dL (ref 8.9–10.3)
Chloride: 101 mmol/L (ref 98–111)
Creatinine, Ser: 0.57 mg/dL (ref 0.44–1.00)
GFR, Estimated: >60 mL/min (ref >60)
Glucose, Bld: 120 mg/dL — ABNORMAL HIGH (ref 70–99)
Potassium: 3.7 mmol/L (ref 3.5–5.1)
Sodium: 136 mmol/L (ref 135–145)

## 2021-01-18 LAB — MAGNESIUM: Magnesium: 1.8 mg/dL (ref 1.7–2.4)

## 2021-01-18 LAB — SARS CORONAVIRUS 2 (TAT 6-24 HRS): SARS Coronavirus 2: NEGATIVE

## 2021-01-18 LAB — VITAMIN B1: Vitamin B1 (Thiamine): 115 nmol/L (ref 66.5–200.0)

## 2021-01-18 NOTE — Care Management Important Message (Signed)
Important Message  Patient Details  Name: [name] MRN: [id number] Date of Birth: 04-Jun-1928   Medicare Important Message Given:  Yes     Shelda Altes 01/18/2021, 11:58 AM

## 2021-01-18 NOTE — Progress Notes (Signed)
Cardiology Progress Note  Patient ID: [name] MRN: [id number] DOB: 08-21-1928 Date of Encounter: 01/18/2021  Primary Cardiologist: Lauree Chandler, MD  Subjective   Chief Complaint: Weakness/fatigue  HPI: Pacemaker working well.  Has chronic gallstones.  Gastritis mention on CT.  Still struggling with nausea.  ROS:  All other ROS reviewed and negative. Pertinent positives noted in the HPI.     Inpatient Medications  Scheduled Meds:  apixaban  2.5 mg Oral BID   budesonide  0.5 mg Inhalation BID   Chlorhexidine Gluconate Cloth  6 each Topical Daily   cholecalciferol  1,000 Units Oral Daily   docusate sodium  100 mg Oral Daily   feeding supplement  237 mL Oral BID BM   fluticasone  2 spray Each Nare Daily   guaiFENesin  600 mg Oral BID   linaclotide  72 mcg Oral Q breakfast   loratadine  10 mg Oral Daily   metoprolol tartrate  25 mg Oral BID   pantoprazole  40 mg Oral BID   rosuvastatin  40 mg Oral Daily   sodium chloride flush  3 mL Intravenous Q12H   thiamine injection  100 mg Intravenous Daily   Continuous Infusions:  sodium chloride Stopped (01/15/21 1230)   PRN Meds: acetaminophen, albuterol, ALPRAZolam, diazepam, guaiFENesin, nitroGLYCERIN, ondansetron (ZOFRAN) IV, polyethylene glycol, zolpidem   Vital Signs   Vitals:   01/17/21 1440 01/17/21 1947 01/18/21 0344 01/18/21 0819  BP: (!) 114/57 (!) 113/47    Pulse: 70   70  Resp: '20 18 19 18  '$ Temp: 98 F (36.7 C) (!) 97.5 F (36.4 C) (!) 97.5 F (36.4 C)   TempSrc: Axillary Oral Oral   SpO2: 98%   98%  Weight:   40.1 kg   Height:        Intake/Output Summary (Last 24 hours) at 01/18/2021 1152 Last data filed at 01/18/2021 0854 Gross per 24 hour  Intake 240 ml  Output 400 ml  Net -160 ml   Last 3 Weights 01/18/2021 01/17/2021 01/16/2021  Weight (lbs) 88 lb 6.5 oz 89 lb 8.1 oz 87 lb 4.8 oz  Weight (kg) 40.1 kg 40.6 kg 39.6 kg      Telemetry  Overnight telemetry shows atrial fibrillation with  intermittent V pacing, which I personally reviewed.    Physical Exam   Vitals:   01/17/21 1440 01/17/21 1947 01/18/21 0344 01/18/21 0819  BP: (!) 114/57 (!) 113/47    Pulse: 70   70  Resp: '20 18 19 18  '$ Temp: 98 F (36.7 C) (!) 97.5 F (36.4 C) (!) 97.5 F (36.4 C)   TempSrc: Axillary Oral Oral   SpO2: 98%   98%  Weight:   40.1 kg   Height:        Intake/Output Summary (Last 24 hours) at 01/18/2021 1152 Last data filed at 01/18/2021 0854 Gross per 24 hour  Intake 240 ml  Output 400 ml  Net -160 ml    Last 3 Weights 01/18/2021 01/17/2021 01/16/2021  Weight (lbs) 88 lb 6.5 oz 89 lb 8.1 oz 87 lb 4.8 oz  Weight (kg) 40.1 kg 40.6 kg 39.6 kg    Body mass index is 16.17 kg/m.   General: Cachexia noted, frail Head: Atraumatic, normal size  Eyes: PEERLA, EOMI  Neck: Supple, no JVD Endocrine: No thryomegaly Cardiac: Normal S1, S2; irregular rhythm, 2 out of 6 systolic ejection murmur Lungs: Clear to auscultation bilaterally, no wheezing, rhonchi or rales  Abd: Soft, nontender, no hepatomegaly  Ext: No edema, pulses 2+ Musculoskeletal: No deformities, BUE and BLE strength normal and equal Skin: Warm and dry, no rashes   Neuro: Alert and oriented to person, place, time, and situation, CNII-XII grossly intact, no focal deficits  Psych: Normal mood and affect   Labs  High Sensitivity Troponin:   Recent Labs  Lab 01/14/21 1643 01/14/21 1843  TROPONINIHS 16 19*     Cardiac EnzymesNo results for input(s): TROPONINI in the last 168 hours. No results for input(s): TROPIPOC in the last 168 hours.  Chemistry Recent Labs  Lab 01/15/21 [phone] 01/15/21 1213 01/16/21 0234 01/16/21 2125 01/17/21 0437 01/18/21 0113  NA 139  --  135  --  136 136  K 2.8*   < > 4.3  --  3.8 3.7  CL 102  --  105  --  98 101  CO2 25  --  21*  --  26 28  GLUCOSE 114*  --  83  --  100* 120*  BUN 8  --  9  --  9 10  CREATININE 0.51  --  0.63  --  0.55 0.57  CALCIUM 9.8  --  9.2  --  9.7 9.5  PROT 7.1  --    --  5.7*  --   --   ALBUMIN 4.0  --   --  3.3*  --   --   AST 18  --   --  23  --   --   ALT 15  --   --  8  --   --   ALKPHOS 41  --   --  39  --   --   BILITOT 1.1  --   --  1.0  --   --   GFRNONAA >60  --  >60  --  >60 >60  ANIONGAP 12  --  9  --  12 7   < > = values in this interval not displayed.    Hematology Recent Labs  Lab 01/16/21 0234 01/17/21 0437 01/18/21 0113  WBC 8.3 8.5 9.0  RBC 3.69* 3.92 4.02  HGB 10.9* 11.8* 12.1  HCT 33.5* 35.6* 36.2  MCV 90.8 90.8 90.0  MCH 29.5 30.1 30.1  MCHC 32.5 33.1 33.4  RDW 13.6 13.5 13.6  PLT 140* 148* 185   BNPNo results for input(s): BNP, PROBNP in the last 168 hours.  DDimer No results for input(s): DDIMER in the last 168 hours.   Radiology  CT CHEST ABDOMEN PELVIS W CONTRAST  Result Date: 01/16/2021 CLINICAL DATA:  failure to thrive, early satiety EXAM: CT CHEST, ABDOMEN, AND PELVIS WITH CONTRAST TECHNIQUE: Multidetector CT imaging of the chest, abdomen and pelvis was performed following the standard protocol during bolus administration of intravenous contrast. CONTRAST:  41m OMNIPAQUE IOHEXOL 350 MG/ML SOLN COMPARISON:  April 22, 2020, November 01, 2016, July 25, 2003. FINDINGS: CT CHEST FINDINGS Cardiovascular: Heart is normal in size. Status post TAVR. Status post CABG. Atherosclerotic calcifications of the aorta and coronary arteries. No pericardial effusion. Mediastinum/Nodes: Multiple subcentimeter thyroid nodules, for which no specific follow-up is recommended. No mediastinal adenopathy. Lungs/Pleura: No pleural effusion or pneumothorax. Irregular partially calcified RIGHT apical scarring, grossly similar since 2005. Irregular 7 x 2 mm LEFT apical nodular opacity is unchanged since 2005, consistent with a benign etiology (series 4, image 31). Scattered endobronchial debris. Scattered calcified pulmonary nodules. 2 mm RIGHT lower lobe pulmonary nodule, stable since 2018, consistent with a benign etiology (series 4, image 100).  Musculoskeletal: In the LEFT chest wall, there is subcutaneous air and a small amount of non organized fluid as well as LEFT inferior breast superficial edema. Streak artifact from pacemaker limits evaluation. This is most consistent with recent pacemaker insertion. CT ABDOMEN PELVIS FINDINGS Hepatobiliary: Revisualization of multiple gallstones within the gallbladder. There are multiple high density foci within the common bile duct, similar in comparison to prior. Common bile duct remains mildly dilated at 10 mm. No intrahepatic biliary ductal dilation. Coarse calcification of the LEFT liver, consistent with sequela of remote prior trauma or granulomatous infection. Pancreas: Unchanged mild prominence of the pancreatic duct. No peripancreatic fat stranding. Spleen: Innumerable tiny hypodense lesions of the spleen, nonspecific. Adrenals/Urinary Tract: Adrenal glands are unchanged mildly prominent in appearance. No hydronephrosis. Exophytic RIGHT renal cyst. Subcentimeter hypodense lesions are too small to accurately characterize. Bladder is unremarkable. Stomach/Bowel: No evidence of bowel obstruction. There are multiple varices in the stomach wall with early hyperenhancement of the prominent stomach folds. Diverticulosis. No evidence of appendicitis. Vascular/Lymphatic: Atherosclerotic calcifications of the nonaneurysmal aorta. Mild ectasia of the infrarenal abdominal aorta, unchanged in comparison to prior (no follow-up is recommended.). Reproductive: Status post hysterectomy. No adnexal masses. Other: No free air or free fluid.  Fat containing inguinal hernias. Musculoskeletal: Thoracolumbar scoliosis with marked levocurvature at the thoracolumbar junction. IMPRESSION: 1. Revisualization of multiple choledocholithiasis with mild dilation of the common bile duct. Recommend correlation with liver function tests. 2. There is early mucosal hyperenhancement of the smoothly thickened gastric folds with suggestion of  underlying gastric varices. Findings are nonspecific and could reflect gastritis in the appropriate clinical setting. 3. Postsurgical changes of the LEFT chest wall adjacent to the pacemaker. Aortic Atherosclerosis (ICD10-I70.0). Electronically Signed   By: Valentino Saxon M.D.   On: 01/16/2021 19:48   US Abdomen Limited RUQ (LIVER/GB)  Result Date: 01/17/2021 CLINICAL DATA:  History of abnormal CT. History of choledocholithiasis and cholelithiasis EXAM: ULTRASOUND ABDOMEN LIMITED RIGHT UPPER QUADRANT COMPARISON:  January 16, 2021 FINDINGS: Gallbladder: Limited assessment due to inability to optimally position patient. There are multiple cholelithiasis. Bladder wall thickness measures within normal limits at 2 mm. No pericholecystic fluid visualized. Patient with altered mental status; no sonographic Murphy sign noted by sonographer, but evaluation is limited due to patient mentation. Common bile duct: Diameter: Dilated at least 10 mm. Known choledocholithiasis are not well visualized. Liver: Limited assessment of the liver due to inability to optimally position patient. No focal lesion identified. Mildly increased parenchymal echogenicity. Portal vein is patent on color Doppler imaging with normal direction of blood flow towards the liver. Other: Partial visualization an exophytic RIGHT renal cyst, better evaluated on CT IMPRESSION: 1. Cholelithiasis with dilation of the common bile duct, as seen on CT. Known choledocholithiasis are not well visualized sonographically. No definitive sonographic evidence of acute cholecystitis, but evaluation is limited secondary to patient inability to cooperate with exam. If persistent clinical concern for acute cholelithiasis or obstructing choledocholithiasis, HIDA scan would be useful in delineating cystic duct and common bile duct patency. 2. Mildly increased hepatic echogenicity as can be seen in hepatic steatosis. Electronically Signed   By: Valentino Saxon M.D.    On: 01/17/2021 10:03    Cardiac Studies  TTE 01/15/2021  1. Left ventricular ejection fraction, by estimation, is 55 to 60%. The  left ventricle has normal function. There is mild left ventricular  hypertrophy.   2. Right ventricular systolic function is mildly reduced. The right  ventricular size is normal.   3.  The mitral valve is degenerative. Mild to moderate mitral valve  regurgitation. Severe mitral annular calcification.   4. Tricuspid valve regurgitation is mild to moderate.   5. Unable to assess TAVR valve hemodynamically due to limited acoustic  windows for imaging. Valve is grossly normal in appearance with trivial  central prosthetic AI. No rocking or dehiscence.. The aortic valve has  been repaired/replaced. Aortic valve  regurgitation is trivial. There is a 23 mm Sapien prosthetic (TAVR) valve  present in the aortic position. Procedure Date: 01/03/2017.   Patient Profile  [name] is a 87 y.o. female with history of CAD status post CABG, atrial fibrillation status post maze, prior stroke status post right CEA, severe aortic stenosis status post TAVR who was admitted on 01/14/2021 with syncope and A. fib with SVR.  She was found to have frequent sinus pauses concerning for sick sinus syndrome.  She is status post dual-chamber pacemaker implantation on 01/15/2021.  Assessment & Plan   Sick sinus syndrome/atrial fibrillation with SVR/sinus pauses/syncope -Admitted with syncope.  Was in A. fib with SVR.  Was unstable.  Underwent pacemaker implantation. -Rhythm is stable now.  Remains in A. fib.  Intermittent pacing noted. -She is on metoprolol tartrate 25 twice daily.  She is on Eliquis 2.5 mg daily. -Pacemaker site looks clean and dry without any issues.  2.  CAD status post CABG -No symptoms of angina.  Continue Crestor 40 mg daily.  Not on aspirin as she is on Eliquis. -Continue beta-blocker.  3.  Severe aortic stenosis status post TAVR -No issues with her  valve.  4. HTN -continue current medications  5.  Severe protein malnutrition/deconditioning/early satiety -Work-up per hospital medicine.  We appreciate their assistance. -Cholelithiasis noted with CBD dilation. Appears chronic per discussion with hospital medicine.  -Gastritis noted.  Symptoms of early satiety could be related to this.  They will discuss this with GI. -Awaiting placement in SNF  FEN -no IVF -diet: thin -code: full -dvt ppx: eliquis  For questions or updates, please contact [place] Please consult [email or web address] for contact info under   Time Spent with Patient: I have spent a total of 35 minutes with patient reviewing hospital notes, telemetry, EKGs, labs and examining the patient as well as establishing an assessment and plan that was discussed with the patient.  > 50% of time was spent in direct patient care.    Signed, Addison Naegeli. Audie Box, MD, [place]  01/18/2021 11:52 AM

## 2021-01-18 NOTE — TOC Progression Note (Addendum)
Transition of Care [facility]) - Progression Note    Patient Details  Name: KETIA SCHURING MRN: [id number] Date of Birth: 05/28/1928  Transition of Care [facility]) CM/SW [place], [place] Phone Number: 01/18/2021, 11:09 AM  Clinical Narrative:     Update- Patient and patients daughter chose SNF placement at [facility]. CSW spoke with [place] with [facility] who confirmed patients SNF bed offer. Patient has SN[facility] bed at [facility] when medically ready. Covid requested.  CSW provided patients daughter with SNF bed offers. Patients daughter is going to discuss SNF bed offers with patient and give CSW call back with SNF choice. CSW will continue to follow and assist with dc planning needs.  Expected Discharge Plan: [place] Barriers to Discharge: SNF Pending bed offer, Continued Medical Work up  Expected Discharge Plan and Services Expected Discharge Plan: [place] arrangements for the past 2 months: Single Family Home                                       Social Determinants of Health (SDOH) Interventions    Readmission Risk Interventions No flowsheet data found.

## 2021-01-18 NOTE — Progress Notes (Signed)
Consult NOTE    [name]  [id number] DOB: 10-06-1928 DOA: 01/14/2021 PCP: Denita Lung, MD   Chief Complaint  Patient presents with   Loss of Consciousness   Brief Narrative:  87 year old female with history of CAD status post CABG, status post TAVR, [name]. fib presents to the ER for the second time in last 24 hours.  Had come to the ER last night for shortness of breath work-up was unremarkable was discharged home.  This morning patient had 2 syncopal episode once while in the bed and second 1 while on the table eating breakfast.  Lasted for few seconds and regained consciousness.  Did not complain of any chest pain.  Was brought to the ER.  Was found to have [name]. fib with slow responses and 6-second pauses.  Cardiology was notified.  Hospitalist was consulted for patient having poor appetite early satiety failure to thrive.  Labs show hemoglobin 11.7 high sensitive troponin was 16 and 19.  Metabolic panel largely unremarkable.  Assessment & Plan:   Principal Problem:   Sick sinus syndrome ([place]) Active Problems:   Hx of CABG 2006 with [facility]   Atrial fibrillation with slow ventricular response (HCC)   Syncope   Anemia   Weakness   Protein-calorie malnutrition, severe   Nausea  [name]. fib with slow ventricular response and pauses  Hypotension s/p pacemaker implantation 8/26.  Off pressors and now on medical floor. Per cardiology who is primary - eliquis being resumed  Failure to thrive  Early Satiety  Early satiety for the past several weeks - months Will plan for CT chest, abdomen, pelvis twith multible cholodocholithiasis with mild dilation of the CBD, suggestion of gastric varices (gastritis?) RUQ [place] with cholelithiasis - no evidence of acute cholecystitsi, increased echogenicity Will continue to monitor, labs do no suggest obstruction - chronic findings of choledocholithiasis -> discussed with GI who did not think any intervention indicated at this point. TSH wnl, cortisol.   Iron, folate, B12, ferritin wnl.  Vitamin C, b1, zinc  (wnl)ordered by RD, appreciate dietitian assistance  Will need continued follow up outpatient   Anemia with history of GI bleed in December 2021 suspected to be likely from diverticular.  Follow CBC.  History of CAD status post CABG  hx Severe AS s/p TAVR  Hx CVA and CAS s/p CEA Noted Continue rosuvastatin  Hypokalemia  improved  DVT prophylaxis: SCD Code Status: full Family Communication daughter at bedside 8/27 Disposition:  Per primary team, cardiology  Procedures:  Echo IMPRESSIONS     1. Left ventricular ejection fraction, by estimation, is 55 to 60%. The  left ventricle has normal function. There is mild left ventricular  hypertrophy.   2. Right ventricular systolic function is mildly reduced. The right  ventricular size is normal.   3. The mitral valve is degenerative. Mild to moderate mitral valve  regurgitation. Severe mitral annular calcification.   4. Tricuspid valve regurgitation is mild to moderate.   5. Unable to assess TAVR valve hemodynamically due to limited acoustic  windows for imaging. Valve is grossly normal in appearance with trivial  central prosthetic AI. No rocking or dehiscence.. The aortic valve has  been repaired/replaced. Aortic valve  regurgitation is trivial. There is [name] 23 mm Sapien prosthetic (TAVR) valve  present in the aortic position. Procedure Date: 01/03/2017.   Antimicrobials:  Anti-infectives (From admission, onward)    Start     Dose/Rate Route Frequency Ordered Stop   01/15/21 2230  ceFAZolin (ANCEF)  IVPB 1 g/50 mL premix        1 g 100 mL/hr over 30 Minutes Intravenous Every 6 hours 01/15/21 1823 01/16/21 1049   01/15/21 1600  gentamicin (GARAMYCIN) 80 mg in sodium chloride 0.9 % 500 mL irrigation        80 mg Irrigation To [facility] 01/15/21 1131 01/15/21 1708   01/15/21 1600  ceFAZolin (ANCEF) IVPB 2g/100 mL premix        2 g 200 mL/hr over 30 Minutes  Intravenous To [facility] 01/15/21 1131 01/15/21 1655          Subjective: No new complaints - initially getting bath'd - so left and returned later  Objective: Vitals:   01/17/21 1947 01/18/21 0344 01/18/21 0819 01/18/21 1330  BP: (!) 113/47   (!) 107/50  Pulse:   70 67  Resp: '18 19 18 18  '$ Temp: (!) 97.5 F (36.4 C) (!) 97.5 F (36.4 C)  98.5 F (36.9 C)  TempSrc: Oral Oral  Oral  SpO2:   98% 99%  Weight:  40.1 kg    Height:        Intake/Output Summary (Last 24 hours) at 01/18/2021 1427 Last data filed at 01/18/2021 0854 Gross per 24 hour  Intake 240 ml  Output 400 ml  Net -160 ml   Filed Weights   01/16/21 0500 01/17/21 0344 01/18/21 0344  Weight: 39.6 kg 40.6 kg 40.1 kg    Examination:  General: No acute distress. cachetic Cardiovascular: RRR Lungs: unlanored Abdomen: Soft, nontender, nondistended Neurological: Alert and oriented 3. Moves all extremities 4 . Cranial nerves II through XII grossly intact. Skin: Warm and dry. No rashes or lesions. Extremities: No clubbing or cyanosis. No edema.  Data Reviewed: I have personally reviewed following labs and imaging studies  CBC: Recent Labs  Lab 01/14/21 1643 01/14/21 2040 01/16/21 0234 01/17/21 0437 01/18/21 0113  WBC 6.4 8.5 8.3 8.5 9.0  NEUTROABS 5.4  --   --   --   --   HGB 11.7* 12.8 10.9* 11.8* 12.1  HCT 37.4 38.9 33.5* 35.6* 36.2  MCV 93.3 90.7 90.8 90.8 90.0  PLT 187 180 140* 148* [id number]    Basic Metabolic Panel: Recent Labs  Lab 01/14/21 1643 01/15/21 0607 01/15/21 1213 01/16/21 0234 01/17/21 0437 01/18/21 0113  NA 140 139  --  135 136 136  K 4.0 2.8* 3.2* 4.3 3.8 3.7  CL 104 102  --  105 98 101  CO2 25 25  --  21* 26 28  GLUCOSE 153* 114*  --  83 100* 120*  BUN 8 8  --  '9 9 10  '$ CREATININE 0.54 0.51  --  0.63 0.55 0.57  CALCIUM 9.6 9.8  --  9.2 9.7 9.5  MG  --  2.1  --  1.8 1.9 1.8  PHOS  --   --   --  2.5 2.6 2.8    GFR: Estimated Creatinine Clearance: 30.2 mL/min  (by C-G formula based on SCr of 0.57 mg/dL).  Liver Function Tests: Recent Labs  Lab 01/15/21 0607 01/16/21 2125 01/18/21 0113  AST '18 23 18  '$ ALT 15 8 <5  ALKPHOS 41 39 38  BILITOT 1.1 1.0 0.6  PROT 7.1 5.7* 6.1*  ALBUMIN 4.0 3.3* 3.2*    CBG: Recent Labs  Lab 01/14/21 2027  GLUCAP 142*     Recent Results (from the past 240 hour(s))  Resp Panel by RT-PCR (Flu Rayvon Brandvold&B, Covid) Nasopharyngeal Swab  Status: None   Collection Time: 01/14/21  5:03 PM   Specimen: Nasopharyngeal Swab; Nasopharyngeal(NP) swabs in vial transport medium  Result Value Ref Range Status   SARS Coronavirus 2 by RT PCR NEGATIVE NEGATIVE Final    Comment: (NOTE) SARS-CoV-2 target nucleic acids are NOT DETECTED.  The SARS-CoV-2 RNA is generally detectable in upper respiratory specimens during the acute phase of infection. The lowest concentration of SARS-CoV-2 viral copies this assay can detect is 138 copies/mL. Amyre Segundo negative result does not preclude SARS-Cov-2 infection and should not be used as the sole basis for treatment or other patient management decisions. Amber Guthridge negative result may occur with  improper specimen collection/handling, submission of specimen other than nasopharyngeal swab, presence of viral mutation(s) within the areas targeted by this assay, and inadequate number of viral copies(<138 copies/mL). Annamae Shivley negative result must be combined with clinical observations, patient history, and epidemiological information. The expected result is Negative.  Fact Sheet for Patients:  EntrepreneurPulse.com.au  Fact Sheet for Healthcare Providers:  IncredibleEmployment.be  This test is no t yet approved or cleared by the [place] FDA and  has been authorized for detection and/or diagnosis of SARS-CoV-2 by FDA under an Emergency Use Authorization (EUA). This EUA will remain  in effect (meaning this test can be used) for the duration of the COVID-19 declaration  under Section 564(b)(1) of the Act, 21 U.S.C.section 360bbb-3(b)(1), unless the authorization is terminated  or revoked sooner.       Influenza Keliyah Spillman by PCR NEGATIVE NEGATIVE Final   Influenza B by PCR NEGATIVE NEGATIVE Final    Comment: (NOTE) The Xpert Xpress SARS-CoV-2/FLU/RSV plus assay is intended as an aid in the diagnosis of influenza from Nasopharyngeal swab specimens and should not be used as Gareld Obrecht sole basis for treatment. Nasal washings and aspirates are unacceptable for Xpert Xpress SARS-CoV-2/FLU/RSV testing.  Fact Sheet for Patients: EntrepreneurPulse.com.au  Fact Sheet for Healthcare Providers: IncredibleEmployment.be  This test is not yet approved or cleared by the [place] FDA and has been authorized for detection and/or diagnosis of SARS-CoV-2 by FDA under an Emergency Use Authorization (EUA). This EUA will remain in effect (meaning this test can be used) for the duration of the COVID-19 declaration under Section 564(b)(1) of the Act, 21 U.S.C. section 360bbb-3(b)(1), unless the authorization is terminated or revoked.  Performed at [facility], [place] [address], [place], [place] [address]   SARS CORONAVIRUS 2 (TAT 6-24 HRS) Nasopharyngeal Nasopharyngeal Swab     Status: None   Collection Time: 01/14/21  6:53 PM   Specimen: Nasopharyngeal Swab  Result Value Ref Range Status   SARS Coronavirus 2 NEGATIVE NEGATIVE Final    Comment: (NOTE) SARS-CoV-2 target nucleic acids are NOT DETECTED.  The SARS-CoV-2 RNA is generally detectable in upper and lower respiratory specimens during the acute phase of infection. Negative results do not preclude SARS-CoV-2 infection, do not rule out co-infections with other pathogens, and should not be used as the sole basis for treatment or other patient management decisions. Negative results must be combined with clinical observations, patient history, and epidemiological information. The  expected result is Negative.  Fact Sheet for Patients: SugarRoll.be  Fact Sheet for Healthcare Providers: https://www.woods-mathews.com/  This test is not yet approved or cleared by the [place] FDA and  has been authorized for detection and/or diagnosis of SARS-CoV-2 by FDA under an Emergency Use Authorization (EUA). This EUA will remain  in effect (meaning this test can be used) for the duration of the  COVID-19 declaration under Se ction 564(b)(1) of the Act, 21 U.S.C. section 360bbb-3(b)(1), unless the authorization is terminated or revoked sooner.  Performed at [facility], [place] [address] [place], [place] [address]   MRSA Next Gen by PCR, Nasal     Status: None   Collection Time: 01/14/21  8:30 PM   Specimen: Nasal Mucosa; Nasal Swab  Result Value Ref Range Status   MRSA by PCR Next Gen NOT DETECTED NOT DETECTED Final    Comment: (NOTE) The GeneXpert MRSA Assay (FDA approved for NASAL specimens only), is one component of Cledis Sohn comprehensive MRSA colonization surveillance program. It is not intended to diagnose MRSA infection nor to guide or monitor treatment for MRSA infections. Test performance is not FDA approved in patients less than 73 years old. Performed at [facility], [place] [address], [place], [place] [address]   Surgical PCR screen     Status: None   Collection Time: 01/15/21 11:45 AM   Specimen: Nasal Mucosa; Nasal Swab  Result Value Ref Range Status   MRSA, PCR NEGATIVE NEGATIVE Final   Staphylococcus aureus NEGATIVE NEGATIVE Final    Comment: (NOTE) The Xpert SA Assay (FDA approved for NASAL specimens in patients 103 years of age and older), is one component of Javana Schey comprehensive surveillance program. It is not intended to diagnose infection nor to guide or monitor treatment. Performed at [facility], [place] [address] [place], [place] [address]          Radiology Studies: CT CHEST ABDOMEN  PELVIS W CONTRAST  Result Date: 01/16/2021 CLINICAL DATA:  failure to thrive, early satiety EXAM: CT CHEST, ABDOMEN, AND PELVIS WITH CONTRAST TECHNIQUE: Multidetector CT imaging of the chest, abdomen and pelvis was performed following the standard protocol during bolus administration of intravenous contrast. CONTRAST:  32m OMNIPAQUE IOHEXOL 350 MG/ML SOLN COMPARISON:  April 22, 2020, November 01, 2016, July 25, 2003. FINDINGS: CT CHEST FINDINGS Cardiovascular: Heart is normal in size. Status post TAVR. Status post CABG. Atherosclerotic calcifications of the aorta and coronary arteries. No pericardial effusion. Mediastinum/Nodes: Multiple subcentimeter thyroid nodules, for which no specific follow-up is recommended. No mediastinal adenopathy. Lungs/Pleura: No pleural effusion or pneumothorax. Irregular partially calcified RIGHT apical scarring, grossly similar since 2005. Irregular 7 x 2 mm LEFT apical nodular opacity is unchanged since 2005, consistent with Jadie Allington benign etiology (series 4, image 31). Scattered endobronchial debris. Scattered calcified pulmonary nodules. 2 mm RIGHT lower lobe pulmonary nodule, stable since 2018, consistent with Xayvier Vallez benign etiology (series 4, image 100). Musculoskeletal: In the LEFT chest wall, there is subcutaneous air and Zaven Klemens small amount of non organized fluid as well as LEFT inferior breast superficial edema. Streak artifact from pacemaker limits evaluation. This is most consistent with recent pacemaker insertion. CT ABDOMEN PELVIS FINDINGS Hepatobiliary: Revisualization of multiple gallstones within the gallbladder. There are multiple high density foci within the common bile duct, similar in comparison to prior. Common bile duct remains mildly dilated at 10 mm. No intrahepatic biliary ductal dilation. Coarse calcification of the LEFT liver, consistent with sequela of remote prior trauma or granulomatous infection. Pancreas: Unchanged mild prominence of the pancreatic duct. No  peripancreatic fat stranding. Spleen: Innumerable tiny hypodense lesions of the spleen, nonspecific. Adrenals/Urinary Tract: Adrenal glands are unchanged mildly prominent in appearance. No hydronephrosis. Exophytic RIGHT renal cyst. Subcentimeter hypodense lesions are too small to accurately characterize. Bladder is unremarkable. Stomach/Bowel: No evidence of bowel obstruction. There are multiple varices in the stomach wall with early  hyperenhancement of the prominent stomach folds. Diverticulosis. No evidence of appendicitis. Vascular/Lymphatic: Atherosclerotic calcifications of the nonaneurysmal aorta. Mild ectasia of the infrarenal abdominal aorta, unchanged in comparison to prior (no follow-up is recommended.). Reproductive: Status post hysterectomy. No adnexal masses. Other: No free air or free fluid.  Fat containing inguinal hernias. Musculoskeletal: Thoracolumbar scoliosis with marked levocurvature at the thoracolumbar junction. IMPRESSION: 1. Revisualization of multiple choledocholithiasis with mild dilation of the common bile duct. Recommend correlation with liver function tests. 2. There is early mucosal hyperenhancement of the smoothly thickened gastric folds with suggestion of underlying gastric varices. Findings are nonspecific and could reflect gastritis in the appropriate clinical setting. 3. Postsurgical changes of the LEFT chest wall adjacent to the pacemaker. Aortic Atherosclerosis (ICD10-I70.0). Electronically Signed   By: Valentino Saxon M.D.   On: 01/16/2021 19:48   US Abdomen Limited RUQ (LIVER/GB)  Result Date: 01/17/2021 CLINICAL DATA:  History of abnormal CT. History of choledocholithiasis and cholelithiasis EXAM: ULTRASOUND ABDOMEN LIMITED RIGHT UPPER QUADRANT COMPARISON:  January 16, 2021 FINDINGS: Gallbladder: Limited assessment due to inability to optimally position patient. There are multiple cholelithiasis. Bladder wall thickness measures within normal limits at 2 mm. No  pericholecystic fluid visualized. Patient with altered mental status; no sonographic Murphy sign noted by sonographer, but evaluation is limited due to patient mentation. Common bile duct: Diameter: Dilated at least 10 mm. Known choledocholithiasis are not well visualized. Liver: Limited assessment of the liver due to inability to optimally position patient. No focal lesion identified. Mildly increased parenchymal echogenicity. Portal vein is patent on color Doppler imaging with normal direction of blood flow towards the liver. Other: Partial visualization an exophytic RIGHT renal cyst, better evaluated on CT IMPRESSION: 1. Cholelithiasis with dilation of the common bile duct, as seen on CT. Known choledocholithiasis are not well visualized sonographically. No definitive sonographic evidence of acute cholecystitis, but evaluation is limited secondary to patient inability to cooperate with exam. If persistent clinical concern for acute cholelithiasis or obstructing choledocholithiasis, HIDA scan would be useful in delineating cystic duct and common bile duct patency. 2. Mildly increased hepatic echogenicity as can be seen in hepatic steatosis. Electronically Signed   By: Valentino Saxon M.D.   On: 01/17/2021 10:03        Scheduled Meds:  apixaban  2.5 mg Oral BID   budesonide  0.5 mg Inhalation BID   Chlorhexidine Gluconate Cloth  6 each Topical Daily   cholecalciferol  1,000 Units Oral Daily   docusate sodium  100 mg Oral Daily   feeding supplement  237 mL Oral BID BM   fluticasone  2 spray Each Nare Daily   guaiFENesin  600 mg Oral BID   linaclotide  72 mcg Oral Q breakfast   loratadine  10 mg Oral Daily   metoprolol tartrate  25 mg Oral BID   pantoprazole  40 mg Oral BID   rosuvastatin  40 mg Oral Daily   sodium chloride flush  3 mL Intravenous Q12H   thiamine injection  100 mg Intravenous Daily   Continuous Infusions:  sodium chloride Stopped (01/15/21 1230)     LOS: 4 days     Time spent: over 30 min    Fayrene Helper, MD [facility]   To contact the attending provider between 7A-7P or the covering provider during after hours 7P-7A, please log into the web site [email or web address] and access using universal [place] password for that web site. If you do not have the password, please call the  hospital operator.  01/18/2021, 2:27 PM

## 2021-01-19 DIAGNOSIS — R6881 Early satiety: Secondary | ICD-10-CM | POA: Diagnosis not present

## 2021-01-19 DIAGNOSIS — D6869 Other thrombophilia: Secondary | ICD-10-CM | POA: Diagnosis not present

## 2021-01-19 DIAGNOSIS — J069 Acute upper respiratory infection, unspecified: Secondary | ICD-10-CM | POA: Diagnosis not present

## 2021-01-19 DIAGNOSIS — R627 Adult failure to thrive: Secondary | ICD-10-CM | POA: Diagnosis not present

## 2021-01-19 DIAGNOSIS — Z20822 Contact with and (suspected) exposure to covid-19: Secondary | ICD-10-CM | POA: Diagnosis not present

## 2021-01-19 DIAGNOSIS — Z951 Presence of aortocoronary bypass graft: Secondary | ICD-10-CM | POA: Diagnosis not present

## 2021-01-19 DIAGNOSIS — R0789 Other chest pain: Secondary | ICD-10-CM | POA: Diagnosis not present

## 2021-01-19 DIAGNOSIS — Z87891 Personal history of nicotine dependence: Secondary | ICD-10-CM | POA: Diagnosis not present

## 2021-01-19 DIAGNOSIS — R1310 Dysphagia, unspecified: Secondary | ICD-10-CM | POA: Diagnosis not present

## 2021-01-19 DIAGNOSIS — I4891 Unspecified atrial fibrillation: Secondary | ICD-10-CM | POA: Diagnosis not present

## 2021-01-19 DIAGNOSIS — R079 Chest pain, unspecified: Secondary | ICD-10-CM | POA: Diagnosis not present

## 2021-01-19 DIAGNOSIS — R2689 Other abnormalities of gait and mobility: Secondary | ICD-10-CM | POA: Diagnosis not present

## 2021-01-19 DIAGNOSIS — Z952 Presence of prosthetic heart valve: Secondary | ICD-10-CM | POA: Diagnosis not present

## 2021-01-19 DIAGNOSIS — Z8249 Family history of ischemic heart disease and other diseases of the circulatory system: Secondary | ICD-10-CM | POA: Diagnosis not present

## 2021-01-19 DIAGNOSIS — Z7401 Bed confinement status: Secondary | ICD-10-CM | POA: Diagnosis not present

## 2021-01-19 DIAGNOSIS — R1312 Dysphagia, oropharyngeal phase: Secondary | ICD-10-CM | POA: Diagnosis not present

## 2021-01-19 DIAGNOSIS — I495 Sick sinus syndrome: Secondary | ICD-10-CM | POA: Diagnosis not present

## 2021-01-19 DIAGNOSIS — I10 Essential (primary) hypertension: Secondary | ICD-10-CM | POA: Diagnosis not present

## 2021-01-19 DIAGNOSIS — I2510 Atherosclerotic heart disease of native coronary artery without angina pectoris: Secondary | ICD-10-CM | POA: Diagnosis not present

## 2021-01-19 DIAGNOSIS — K5909 Other constipation: Secondary | ICD-10-CM | POA: Diagnosis not present

## 2021-01-19 DIAGNOSIS — Z8673 Personal history of transient ischemic attack (TIA), and cerebral infarction without residual deficits: Secondary | ICD-10-CM | POA: Diagnosis not present

## 2021-01-19 DIAGNOSIS — R55 Syncope and collapse: Secondary | ICD-10-CM | POA: Diagnosis not present

## 2021-01-19 DIAGNOSIS — Z79899 Other long term (current) drug therapy: Secondary | ICD-10-CM | POA: Diagnosis not present

## 2021-01-19 DIAGNOSIS — R2681 Unsteadiness on feet: Secondary | ICD-10-CM | POA: Diagnosis not present

## 2021-01-19 DIAGNOSIS — I4819 Other persistent atrial fibrillation: Secondary | ICD-10-CM | POA: Diagnosis not present

## 2021-01-19 DIAGNOSIS — E785 Hyperlipidemia, unspecified: Secondary | ICD-10-CM | POA: Diagnosis not present

## 2021-01-19 DIAGNOSIS — M6281 Muscle weakness (generalized): Secondary | ICD-10-CM | POA: Diagnosis not present

## 2021-01-19 DIAGNOSIS — I69391 Dysphagia following cerebral infarction: Secondary | ICD-10-CM | POA: Diagnosis not present

## 2021-01-19 DIAGNOSIS — Z7901 Long term (current) use of anticoagulants: Secondary | ICD-10-CM | POA: Diagnosis not present

## 2021-01-19 DIAGNOSIS — R41841 Cognitive communication deficit: Secondary | ICD-10-CM | POA: Diagnosis not present

## 2021-01-19 DIAGNOSIS — I350 Nonrheumatic aortic (valve) stenosis: Secondary | ICD-10-CM | POA: Diagnosis not present

## 2021-01-19 DIAGNOSIS — K219 Gastro-esophageal reflux disease without esophagitis: Secondary | ICD-10-CM | POA: Diagnosis not present

## 2021-01-19 DIAGNOSIS — R279 Unspecified lack of coordination: Secondary | ICD-10-CM | POA: Diagnosis not present

## 2021-01-19 DIAGNOSIS — Z03818 Encounter for observation for suspected exposure to other biological agents ruled out: Secondary | ICD-10-CM | POA: Diagnosis not present

## 2021-01-19 DIAGNOSIS — K8020 Calculus of gallbladder without cholecystitis without obstruction: Secondary | ICD-10-CM | POA: Diagnosis not present

## 2021-01-19 DIAGNOSIS — J45909 Unspecified asthma, uncomplicated: Secondary | ICD-10-CM | POA: Diagnosis not present

## 2021-01-19 DIAGNOSIS — R634 Abnormal weight loss: Secondary | ICD-10-CM | POA: Diagnosis not present

## 2021-01-19 DIAGNOSIS — Z8719 Personal history of other diseases of the digestive system: Secondary | ICD-10-CM | POA: Diagnosis not present

## 2021-01-19 DIAGNOSIS — E43 Unspecified severe protein-calorie malnutrition: Secondary | ICD-10-CM | POA: Diagnosis not present

## 2021-01-19 DIAGNOSIS — K8050 Calculus of bile duct without cholangitis or cholecystitis without obstruction: Secondary | ICD-10-CM | POA: Diagnosis not present

## 2021-01-19 MED ORDER — METOPROLOL TARTRATE 25 MG PO TABS: 25.0000 mg | ORAL_TABLET | Freq: Two times a day (BID) | ORAL | 3 refills | Status: DC

## 2021-01-19 NOTE — Progress Notes (Signed)
Contacted [facility] and report has been given.

## 2021-01-19 NOTE — Discharge Summary (Signed)
Discharge Summary    Patient ID: [name] MRN: [id number]; DOB: 04-27-1929  Admit date: 01/14/2021 Discharge date: 01/19/2021  PCP:  Denita Lung, MD   [facility] Providers Cardiologist:  Lauree Chandler, MD  Electrophysiologist:  Constance Haw, MD  {  Discharge Diagnoses    Principal Problem:   Sick sinus syndrome [facility]) Active Problems:   Hx of CABG 2006 with Maze   Atrial fibrillation with slow ventricular response (HCC)   Syncope   Anemia   Weakness   Protein-calorie malnutrition, severe   Nausea  Sinus pause ? Gastritis   Diagnostic Studies/Procedures    Echo 01/15/2021 1. Left ventricular ejection fraction, by estimation, is 55 to 60%. The  left ventricle has normal function. There is mild left ventricular  hypertrophy.   2. Right ventricular systolic function is mildly reduced. The right  ventricular size is normal.   3. The mitral valve is degenerative. Mild to moderate mitral valve  regurgitation. Severe mitral annular calcification.   4. Tricuspid valve regurgitation is mild to moderate.   5. Unable to assess TAVR valve hemodynamically due to limited acoustic  windows for imaging. Valve is grossly normal in appearance with trivial  central prosthetic AI. No rocking or dehiscence.. The aortic valve has  been repaired/replaced. Aortic valve  regurgitation is trivial. There is a 23 mm Sapien prosthetic (TAVR) valve  present in the aortic position. Procedure Date: 01/03/2017.   PROCEDURES: 01/15/21  1. Pacemaker implantation.  DESCRIPTION OF PROCEDURE:  Informed written consent was obtained, and  the patient was brought to the electrophysiology lab in a fasting state.  The patient required no sedation for the procedure today.  The patients left chest was prepped and draped in the usual sterile fashion by the EP lab staff. The skin overlying the left deltopectoral region was infiltrated with lidocaine for local analgesia.  A 4-cm incision was  made over the left deltopectoral region.  A left subcutaneous pacemaker pocket was fashioned using a combination of sharp and blunt dissection. Electrocautery was required to assure hemostasis.     RA/RV Lead Placement: The left axillary vein was therefore cannulated.  Through the left axillary vein, a Abbot Medical model Tendril MRI [id number] (serial number  [id number]) right atrial lead and an Abbott Medical model Tendril MRI [id number] (serial number  [id number]) right ventricular lead were advanced with fluoroscopic visualization into the right atrial appendage and right ventricular apex positions respectively.  Initial atrial lead P- waves measured 3.4 mV with impedance of 430 ohms in atrial fibrillation.  Right ventricular lead was dependent with an impedance of 650 ohms and a threshold of 0.5 V at 0.5 msec.  Both leads were secured to the pectoralis fascia using #2-0 silk over the suture sleeves.    Device Placement:  The leads were then connected to an [place] MRI  model [id number] (serial number  [id number] ) pacemaker.  The pocket was irrigated with copious gentamicin solution.  The pacemaker was then placed into the pocket.  The pocket was then closed in 3 layers with 2.0 Vicryl suture for the 3.0 Vicryl suture subcutaneous and subcuticular layers.  Steri-  Strips and a sterile dressing were then applied. EBL<56m.  There were no early apparent complications.     CONCLUSIONS:   1. Successful implantation of a St Jude Medical Assurity MRI dual-chamber pacemaker for symptomatic bradycardia  2. No early apparent complications.    History of Present Illness     Lori Kueny  [name] is a 87 y.o. female with with a hx of diverticular GIB 04/2020, CABG w/ MAZE 2006, 3/4 grafts patent cath 2018, Afib on Eliquis, CVA, R-CEA, TAVR 2018, HTN, HLD, 2nd deg AVB, LBBB presented with syncope.   [name] lives with her daughter.  Recently losing weight with poor appetite and early satiety.  Syncope episode  while going to eat a little and lost consciousness at the kitchen table for about 10 to 15 seconds.  She fall forward.  Witnessed by daughter and brought to ER by EMS.  Patient had another witnessed syncope while in ER when staff was trying to hook to monitor. LOC for few seconds. Noted bradycardic in 30s. Placed on [place].    Hospital Course     Consultants: EP and IM   Sick sinus syndrome Atrial fibrillation with slow ventricular rate Sinus pause Syncope S/p pacemaker implantation  Patient was noted in atrial fibrillation with slow ventricular rate on admission. Placed on external pacing pads with back up rate of 40 and dopamine 5 mcg running. HR improved.  Metoprolol was held (last dose 8/25).  It was find out that patient has 2 weeks history of unusual shortness of breath with dizzy spell.  Her Eliquis was held and treated with heparin.  Preserved LV function.  Patient was seen by Dr. Curt Bears and underwent placement of central dual-chamber pacemaker.  Device functioning appropriately.  Eliquis restarted.  Her hypotension improved with pacing.  Telemetry showed V pacing persistently and tolerating Lopressor 25 mg twice daily.  6. CAD s/p CABG -No anginal symptoms.  Not on aspirin due to need of anticoagulation.  Continue Crestor and Toprol.  7.  Severe aortic stenosis s/p TAVR -No issue  8.  Severe protein malnutrition/deconditioning/early satiety/gastritis -The patient was followed by internal medicine team.  Appreciated recommendations and assistance. - Ct of chest abdominal pelvis with multiple choledocholithiasis with mild dilation of the common bile duct. Possible gastritis.  Abdominal ultrasound without acute evidence of cholecystitis.  Per IM discussion with GI>> no intervention needed at this point.  Patient will follow-up with PCP in outpatient setting.  Patient has seen by dietitian.  Pending vitamin C level  Did the patient have an acute coronary syndrome (MI, NSTEMI, STEMI,  etc) this admission?:  No                               Did the patient have a percutaneous coronary intervention (stent / angioplasty)?:  No.    Discharge Vitals Blood pressure (!) 127/57, pulse 72, temperature 98.3 F (36.8 C), temperature source Oral, resp. rate 16, height '5\' 2"'$  (1.575 m), weight 40.8 kg, SpO2 100 %.  Filed Weights   01/17/21 0344 01/18/21 0344 01/19/21 0630  Weight: 40.6 kg 40.1 kg 40.8 kg   Physical Exam Constitutional:      Comments: Thin frail elderly female in no acute distress  HENT:     Head: Normocephalic.  Eyes:     Pupils: Pupils are equal, round, and reactive to light.  Cardiovascular:     Rate and Rhythm: Normal rate and regular rhythm.  Pulmonary:     Effort: Pulmonary effort is normal.     Breath sounds: Normal breath sounds.  Abdominal:     General: Abdomen is flat. Bowel sounds are normal.     Palpations: Abdomen is soft.  Musculoskeletal:     Cervical back: Normal range of motion and neck supple.  Skin:    General: Skin is warm and dry.  Neurological:     General: No focal deficit present.     Mental Status: She is oriented to person, place, and time.  Psychiatric:        Mood and Affect: Mood normal.        Behavior: Behavior normal.    Labs & Radiologic Studies    CBC Recent Labs    01/17/21 0437 01/18/21 0113  WBC 8.5 9.0  HGB 11.8* 12.1  HCT 35.6* 36.2  MCV 90.8 90.0  PLT 148* [id number]   Basic Metabolic Panel Recent Labs    01/17/21 0437 01/18/21 0113  NA 136 136  K 3.8 3.7  CL 98 101  CO2 26 28  GLUCOSE 100* 120*  BUN 9 10  CREATININE 0.55 0.57  CALCIUM 9.7 9.5  MG 1.9 1.8  PHOS 2.6 2.8   Liver Function Tests Recent Labs    01/16/21 2125 01/18/21 0113  AST 23 18  ALT 8 <5  ALKPHOS 39 38  BILITOT 1.0 0.6  PROT 5.7* 6.1*  ALBUMIN 3.3* 3.2*   High Sensitivity Troponin:   Recent Labs  Lab 01/14/21 1643 01/14/21 1843  TROPONINIHS 16 19*   _____________  DG Chest 2 View  Result Date:  01/16/2021 CLINICAL DATA:  Pacemaker placement. EXAM: CHEST - 2 VIEW COMPARISON:  January 14, 2021. FINDINGS: The heart size and mediastinal contours are within normal limits. Both lungs are clear. Status post coronary bypass graft and transcatheter aortic valve repair. Interval placement of left-sided pacemaker with leads in grossly good position. Pneumothorax is noted. Minimal right pleural effusion may be present. The visualized skeletal structures are unremarkable. IMPRESSION: Interval placement of left-sided pacemaker. Minimal right pleural effusion. Aortic Atherosclerosis (ICD10-I70.0). Electronically Signed   By: Marijo Conception M.D.   On: 01/16/2021 08:52   CT CHEST ABDOMEN PELVIS W CONTRAST  Result Date: 01/16/2021 CLINICAL DATA:  failure to thrive, early satiety EXAM: CT CHEST, ABDOMEN, AND PELVIS WITH CONTRAST TECHNIQUE: Multidetector CT imaging of the chest, abdomen and pelvis was performed following the standard protocol during bolus administration of intravenous contrast. CONTRAST:  38m OMNIPAQUE IOHEXOL 350 MG/ML SOLN COMPARISON:  April 22, 2020, November 01, 2016, July 25, 2003. FINDINGS: CT CHEST FINDINGS Cardiovascular: Heart is normal in size. Status post TAVR. Status post CABG. Atherosclerotic calcifications of the aorta and coronary arteries. No pericardial effusion. Mediastinum/Nodes: Multiple subcentimeter thyroid nodules, for which no specific follow-up is recommended. No mediastinal adenopathy. Lungs/Pleura: No pleural effusion or pneumothorax. Irregular partially calcified RIGHT apical scarring, grossly similar since 2005. Irregular 7 x 2 mm LEFT apical nodular opacity is unchanged since 2005, consistent with a benign etiology (series 4, image 31). Scattered endobronchial debris. Scattered calcified pulmonary nodules. 2 mm RIGHT lower lobe pulmonary nodule, stable since 2018, consistent with a benign etiology (series 4, image 100). Musculoskeletal: In the LEFT chest wall, there is  subcutaneous air and a small amount of non organized fluid as well as LEFT inferior breast superficial edema. Streak artifact from pacemaker limits evaluation. This is most consistent with recent pacemaker insertion. CT ABDOMEN PELVIS FINDINGS Hepatobiliary: Revisualization of multiple gallstones within the gallbladder. There are multiple high density foci within the common bile duct, similar in comparison to prior. Common bile duct remains mildly dilated at 10 mm. No intrahepatic biliary ductal dilation. Coarse calcification of the LEFT liver, consistent with sequela of remote prior trauma or granulomatous infection. Pancreas: Unchanged mild prominence of the pancreatic duct.  No peripancreatic fat stranding. Spleen: Innumerable tiny hypodense lesions of the spleen, nonspecific. Adrenals/Urinary Tract: Adrenal glands are unchanged mildly prominent in appearance. No hydronephrosis. Exophytic RIGHT renal cyst. Subcentimeter hypodense lesions are too small to accurately characterize. Bladder is unremarkable. Stomach/Bowel: No evidence of bowel obstruction. There are multiple varices in the stomach wall with early hyperenhancement of the prominent stomach folds. Diverticulosis. No evidence of appendicitis. Vascular/Lymphatic: Atherosclerotic calcifications of the nonaneurysmal aorta. Mild ectasia of the infrarenal abdominal aorta, unchanged in comparison to prior (no follow-up is recommended.). Reproductive: Status post hysterectomy. No adnexal masses. Other: No free air or free fluid.  Fat containing inguinal hernias. Musculoskeletal: Thoracolumbar scoliosis with marked levocurvature at the thoracolumbar junction. IMPRESSION: 1. Revisualization of multiple choledocholithiasis with mild dilation of the common bile duct. Recommend correlation with liver function tests. 2. There is early mucosal hyperenhancement of the smoothly thickened gastric folds with suggestion of underlying gastric varices. Findings are nonspecific  and could reflect gastritis in the appropriate clinical setting. 3. Postsurgical changes of the LEFT chest wall adjacent to the pacemaker. Aortic Atherosclerosis (ICD10-I70.0). Electronically Signed   By: Valentino Saxon M.D.   On: 01/16/2021 19:48   EP PPM/ICD IMPLANT  Result Date: 01/15/2021 SURGEON:  Allegra Lai, MD   PREPROCEDURE DIAGNOSIS:  atrial fibrillation, heart block   POSTPROCEDURE DIAGNOSIS:  atrial fibrillation, heart block    PROCEDURES:  1. Pacemaker implantation.   INTRODUCTION:  [name] is a 87 y.o. female with a history of bradycardia who presents today for pacemaker implantation.  The patient reports intermittent episodes of dizziness over the past few months.  No reversible causes have been identified.  The patient therefore presents today for pacemaker implantation.   DESCRIPTION OF PROCEDURE:  Informed written consent was obtained, and  the patient was brought to the electrophysiology lab in a fasting state.  The patient required no sedation for the procedure today.  The patients left chest was prepped and draped in the usual sterile fashion by the EP lab staff. The skin overlying the left deltopectoral region was infiltrated with lidocaine for local analgesia.  A 4-cm incision was made over the left deltopectoral region.  A left subcutaneous pacemaker pocket was fashioned using a combination of sharp and blunt dissection. Electrocautery was required to assure hemostasis.  RA/RV Lead Placement: The left axillary vein was therefore cannulated.  Through the left axillary vein, a Abbot Medical model Tendril MRI [id number] (serial number  [id number]) right atrial lead and an Abbott Medical model Tendril MRI [id number] (serial number  [id number]) right ventricular lead were advanced with fluoroscopic visualization into the right atrial appendage and right ventricular apex positions respectively.  Initial atrial lead P- waves measured 3.4 mV with impedance of 430 ohms in atrial fibrillation.   Right ventricular lead was dependent with an impedance of 650 ohms and a threshold of 0.5 V at 0.5 msec.  Both leads were secured to the pectoralis fascia using #2-0 silk over the suture sleeves. Device Placement:  The leads were then connected to an [employer] MRI  model [id number] (serial number  [id number] ) pacemaker.  The pocket was irrigated with copious gentamicin solution.  The pacemaker was then placed into the pocket.  The pocket was then closed in 3 layers with 2.0 Vicryl suture for the 3.0 Vicryl suture subcutaneous and subcuticular layers.  Steri-  Strips and a sterile dressing were then applied. EBL<77m.  There were no early apparent complications.   CONCLUSIONS:  1. Successful implantation of  a [employer] Assurity MRI dual-chamber pacemaker for symptomatic bradycardia  2. No early apparent complications.       Allegra Lai, MD 01/15/2021 5:28 PM   DG Chest Port 1 View  Result Date: 01/14/2021 CLINICAL DATA:  Atrial fibrillation and syncope EXAM: PORTABLE CHEST 1 VIEW COMPARISON:  01/13/2021 FINDINGS: Numerous leads and wires project over the chest. Prior median sternotomy. CABG. Midline trachea. Normal heart size. Atherosclerosis in the transverse aorta. Possible trace right pleural fluid. Right costophrenic angle partially excluded. No pneumothorax. No congestive failure. Right lung base calcified granuloma. No lobar consolidation. IMPRESSION: No acute cardiopulmonary disease. Aortic Atherosclerosis (ICD10-I70.0). Possible trace right pleural fluid. Electronically Signed   By: Abigail Miyamoto M.D.   On: 01/14/2021 17:42   DG Chest Port 1 View  Result Date: 01/13/2021 CLINICAL DATA:  Headache and nausea this morning. Anxious and short of breath throughout the day. EXAM: PORTABLE CHEST 1 VIEW COMPARISON:  04/23/2020 FINDINGS: Postoperative changes in the mediastinum. Normal heart size and pulmonary vascularity. Cardiac valve prosthesis. Emphysematous changes in the lungs. Fibrosis in  the upper lungs. Peribronchial thickening suggesting bronchitic change. Calcified granuloma in the right base. Mild blunting of the right costophrenic angle is unchanged since prior study, likely pleural thickening. Calcification of the aorta. Thoracolumbar scoliosis with degenerative change. IMPRESSION: Emphysematous and chronic bronchitic changes in the lungs. Fibrosis in the apices. Right pleural thickening. No active pulmonary disease. Electronically Signed   By: Lucienne Capers M.D.   On: 01/13/2021 21:55   ECHOCARDIOGRAM LIMITED  Result Date: 01/15/2021    ECHOCARDIOGRAM LIMITED REPORT   Patient Name:   [name] Date of Exam: 01/15/2021 Medical Rec #:  [id number]     Height:       62.0 in Accession #:    [id number]    Weight:       89.7 lb Date of Birth:  09-25-1928    BSA:          1.360 m Patient Age:    19 years      BP:           119/44 mmHg Patient Gender: F             HR:           30 bpm. Exam Location:  Inpatient Procedure: Limited Echo, Cardiac Doppler and Color Doppler Indications:    Post TAVR evaluation Z95.2  History:        Patient has prior history of Echocardiogram examinations, most                 recent 02/26/2020. CAD and Previous Myocardial Infarction, Prior                 CABG, Carotid Disease, Aortic Valve Disease, Arrythmias:Atrial                 Fibrillation and LBBB, Signs/Symptoms:Hypotension, Murmur and                 Syncope; Risk Factors:Hypertension and Dyslipidemia. GERD.                 Aortic Valve: 23 mm Sapien prosthetic, stented (TAVR) valve is                 present in the aortic position. Procedure Date: 01/03/2017.  Sonographer:    Darlina Sicilian RDCS Referring Phys: 60 RHONDA G BARRETT  Sonographer Comments: Externally paced at 30 bpm. Limited Apical and Subcostal views due  to external pacing. IMPRESSIONS  1. Left ventricular ejection fraction, by estimation, is 55 to 60%. The left ventricle has normal function. There is mild left ventricular hypertrophy.   2. Right ventricular systolic function is mildly reduced. The right ventricular size is normal.  3. The mitral valve is degenerative. Mild to moderate mitral valve regurgitation. Severe mitral annular calcification.  4. Tricuspid valve regurgitation is mild to moderate.  5. Unable to assess TAVR valve hemodynamically due to limited acoustic windows for imaging. Valve is grossly normal in appearance with trivial central prosthetic AI. No rocking or dehiscence.. The aortic valve has been repaired/replaced. Aortic valve regurgitation is trivial. There is a 23 mm Sapien prosthetic (TAVR) valve present in the aortic position. Procedure Date: 01/03/2017. FINDINGS  Left Ventricle: Left ventricular ejection fraction, by estimation, is 55 to 60%. The left ventricle has normal function. There is mild left ventricular hypertrophy. Right Ventricle: The right ventricular size is normal. Right ventricular systolic function is mildly reduced. Mitral Valve: The mitral valve is degenerative in appearance. There is moderate calcification of the mitral valve leaflet(s). Severe mitral annular calcification. Mild to moderate mitral valve regurgitation. Tricuspid Valve: The tricuspid valve is grossly normal. Tricuspid valve regurgitation is mild to moderate. Aortic Valve: Unable to assess TAVR valve hemodynamically due to limited acoustic windows for imaging. Valve is grossly normal in appearance with trivial central prosthetic AI. No rocking or dehiscence. The aortic valve has been repaired/replaced. Aortic  valve regurgitation is trivial. There is a 23 mm Sapien prosthetic, stented (TAVR) valve present in the aortic position. Procedure Date: 01/03/2017. Pulmonic Valve: The pulmonic valve was grossly normal. Pulmonic valve regurgitation is mild to moderate. LEFT VENTRICLE PLAX 2D LVIDd:         3.20 cm LVIDs:         2.17 cm LV PW:         0.90 cm LV IVS:        1.10 cm LVOT diam:     2.20 cm LVOT Area:     3.80 cm  LEFT ATRIUM          Index LA diam:    2.70 cm 1.99 cm/m  PULMONIC VALVE PV Vmax:          1.97 m/s PV Peak grad:     15.5 mmHg PR End Diast Vel: 7.84 msec  TRICUSPID VALVE TR Peak grad:   23.8 mmHg TR Vmax:        244.00 cm/s  SHUNTS Systemic Diam: 2.20 cm Cherlynn Kaiser MD Electronically signed by Cherlynn Kaiser MD Signature Date/Time: 01/15/2021/11:27:15 AM    Final    [place] EKG SITE RITE  Result Date: 01/15/2021 If Site Rite image not attached, placement could not be confirmed due to current cardiac rhythm.  US Abdomen Limited RUQ (LIVER/GB)  Result Date: 01/17/2021 CLINICAL DATA:  History of abnormal CT. History of choledocholithiasis and cholelithiasis EXAM: ULTRASOUND ABDOMEN LIMITED RIGHT UPPER QUADRANT COMPARISON:  January 16, 2021 FINDINGS: Gallbladder: Limited assessment due to inability to optimally position patient. There are multiple cholelithiasis. Bladder wall thickness measures within normal limits at 2 mm. No pericholecystic fluid visualized. Patient with altered mental status; no sonographic Murphy sign noted by sonographer, but evaluation is limited due to patient mentation. Common bile duct: Diameter: Dilated at least 10 mm. Known choledocholithiasis are not well visualized. Liver: Limited assessment of the liver due to inability to optimally position patient. No focal lesion identified. Mildly increased parenchymal echogenicity. Portal vein is patent on  color Doppler imaging with normal direction of blood flow towards the liver. Other: Partial visualization an exophytic RIGHT renal cyst, better evaluated on CT IMPRESSION: 1. Cholelithiasis with dilation of the common bile duct, as seen on CT. Known choledocholithiasis are not well visualized sonographically. No definitive sonographic evidence of acute cholecystitis, but evaluation is limited secondary to patient inability to cooperate with exam. If persistent clinical concern for acute cholelithiasis or obstructing choledocholithiasis, HIDA scan would be  useful in delineating cystic duct and common bile duct patency. 2. Mildly increased hepatic echogenicity as can be seen in hepatic steatosis. Electronically Signed   By: Valentino Saxon M.D.   On: 01/17/2021 10:03   Disposition   Pt is being discharged home today in good condition.  Follow-up Plans & Appointments     Follow-up Information     [place] [facility] Follow up.   Specialty: Cardiology Why: 01/28/21 @ 9:20AM, wound check visit Contact information: [address] [place] [phone]        Constance Haw, MD Follow up.   Specialty: Cardiology Why: 04/20/21 @ 2:15PM Contact information: [address] [place] [address] [phone]         Denita Lung, MD. Schedule an appointment as soon as possible for a visit in 1 week(s).   Specialty: Family Medicine Why: for hospital follow up Contact information: [place] [address] [phone]         Deberah Pelton, NP Follow up on 02/19/2021.   Specialty: Cardiology Why: 10:45 for hospital follow up wtih Dr. Camillia Herter PA/NP. Please arrive 15 minutes early Contact information: [place] [address] [phone]                Discharge Instructions     Diet - low sodium heart healthy   Complete by: As directed    Increase activity slowly   Complete by: As directed        Discharge Medications   Allergies as of 01/19/2021       Reactions   Other Other (See Comments)   NO ACIDIC, TART< OR SPICY FOODS- DEVELOPS REFLUX OFTEN!! PATIENT HAS TROUBLE SWALLOWING TABLETS!!   Bactrim [sulfamethoxazole-trimethoprim] Other (See Comments)   "makes her feel funny" or "unsteady"   Amitriptyline Hcl Rash   Aspirin Hives, Swelling, Rash, Other (See Comments)   Body became swollen   Tape Other (See Comments)   SKIN IS VERY THIN- WILL TEAR EASILY!!   Zetia [ezetimibe] Rash         Medication List     STOP taking these medications    isosorbide dinitrate 30 MG tablet Commonly known as: ISORDIL   triamcinolone cream 0.1 % Commonly known as: KENALOG       TAKE these medications    acetaminophen 650 MG CR tablet Commonly known as: TYLENOL Take 1,300 mg by mouth 2 (two) times daily as needed for pain.   diazepam 2 MG tablet Commonly known as: Valium Take 1 tablet (2 mg total) by mouth every 6 (six) hours as needed for anxiety.   docusate sodium 100 MG capsule Commonly known as: COLACE Take 1 capsule (100 mg total) by mouth daily.   Eliquis 2.5 MG Tabs tablet Generic drug: apixaban TAKE 1 TABLET(2.5 MG) BY MOUTH TWICE DAILY What changed: See the new instructions.   Ensure Plus Liqd Take 237 mLs by mouth 2 (two) times daily between meals.  Flovent HFA 220 MCG/ACT inhaler Generic drug: fluticasone INHALE 2 PUFFS INTO THE LUNGS TWICE DAILY   fluticasone 50 MCG/ACT nasal spray Commonly known as: FLONASE SHAKE LIQUID AND USE 2 SPRAYS IN EACH NOSTRIL EVERY DAY   guaiFENesin 100 MG/5ML liquid Commonly known as: ROBITUSSIN Take 100 mg by mouth 2 (two) times daily as needed for cough or congestion. Reported on 12/08/2015   guaiFENesin 600 MG 12 hr tablet Commonly known as: MUCINEX Take 1 tablet (600 mg total) by mouth 2 (two) times daily.   Linzess 72 MCG capsule Generic drug: linaclotide Take 72 mcg by mouth daily.   loratadine 10 MG tablet Commonly known as: CLARITIN Take 10 mg by mouth daily.   metoprolol tartrate 25 MG tablet Commonly known as: LOPRESSOR Take 1 tablet (25 mg total) by mouth 2 (two) times daily. What changed: how much to take   nitroGLYCERIN 0.4 MG SL tablet Commonly known as: NITROSTAT DISSOLVE 1 TABLET UNDER TONGUE EVERY 5 MINUTES AS NEEDED FOR CHEST PAIN What changed: See the new instructions.   pantoprazole 40 MG tablet Commonly known as: PROTONIX TAKE 1 TABLET BY MOUTH TWICE DAILY   polyethylene glycol  powder 17 GM/SCOOP powder Commonly known as: MiraLax Take 17 g by mouth 2 (two) times daily as needed for moderate constipation.   rosuvastatin 40 MG tablet Commonly known as: CRESTOR TAKE 1 TABLET BY MOUTH EVERY DAY   Ventolin HFA 108 (90 Base) MCG/ACT inhaler Generic drug: albuterol INHALE 2 PUFFS BY MOUTH EVERY 6 HOURS AS NEEDED FOR WHEEZING OR SHORTNESS OF BREATH What changed: See the new instructions.   Vitamin D-3 25 MCG (1000 UT) Caps Take 1,000 Units by mouth daily.           Outstanding Labs/Studies   None  Duration of Discharge Encounter   Greater than 30 minutes including physician time.  Signed, Leanor Kail, PA 01/19/2021, 11:16 AM

## 2021-01-19 NOTE — TOC Transition Note (Signed)
Transition of Care [facility]) - CM/SW Discharge Note   Patient Details  Name: [name] MRN: [id number] Date of Birth: November 11, 1928  Transition of Care [facility]) CM/SW Contact:  Trula Ore, [place] Phone Number: 01/19/2021, 1:11 PM   Clinical Narrative:     Patient will DC to: [facility]   Anticipated DC date: 01/19/21  Family notified: [name]   Transport by: [name]  ?  Per MD patient ready for DC to [facility] . RN, patient, patient's family, and facility notified of DC. Discharge Summary sent to facility. RN given number for report tele#[phone] RM#[phone]. DC packet on chart. Ambulance transport requested for patient.  CSW signing off.   Final next level of care: Skilled Nursing Facility Barriers to Discharge: No Barriers Identified   Patient Goals and CMS Choice Patient states their goals for this hospitalization and ongoing recovery are:: to go to SNF CMS Medicare.gov Compare Post Acute Care list provided to:: Patient Represenative (must comment) (patient and patients daughter) Choice offered to / list presented to : Patient, Adult Children  Discharge Placement              Patient chooses bed at: [facility] Patient to be transferred to facility by: [facility] Name of family member notified: [name] Patient and family notified of of transfer: 01/19/21  Discharge Plan and Services                                     Social Determinants of Health (SDOH) Interventions     Readmission Risk Interventions No flowsheet data found.

## 2021-01-19 NOTE — NC FL2 (Signed)
[place] LEVEL OF CARE SCREENING TOOL     IDENTIFICATION  Patient Name: [name] Birthdate: 11-13-1928 Sex: female Admission Date (Current Location): 01/14/2021  [facility] and [place] Number:  Herbalist and Address:  [facility] [place], [place] [address] [place], [place] [address]      Provider Number: [id number]  Attending Physician Name and Address:  Werner Lean,*  Relative Name and Phone Number:  [name] [phone] [place]: Hospital Recommended Level of Care: [place] Prior Approval Number:    Date Approved/Denied:   PASRR Number: [id number]  Discharge Plan: SNF    Current Diagnoses: Patient Active Problem List   Diagnosis Date Noted   Nausea 01/18/2021   Protein-calorie malnutrition, severe 01/16/2021   Atrial fibrillation with slow ventricular response ([place]) 01/14/2021   Sick sinus syndrome ([place]) 01/14/2021   Syncope 01/14/2021   Anemia 01/14/2021   Weakness 01/14/2021   GI bleed 04/22/2020   Presbycusis of both ears 11/13/2019   Vertigo 03/27/2017   Arthritis 03/27/2017   Stroke ([place])    Status post transcatheter aortic valve replacement (TAVR) using bioprosthesis 01/03/2017   Chronic apical periodontitis 12/28/2016   Retained dental roots 12/28/2016   Bilateral maxillary lateral exostoses [id number]   Diastolic dysfunction    Chronic stable angina (HCC)    Tricuspid regurgitation    Pulmonary regurgitation    Left shoulder pain    Back pain-similar to pt's angina 12/01/2015   Carotid artery disease- s/p RCE 12/01/2015   Bowen's disease 03/16/2015   Dependent edema 10/30/2014   History of allergy to aspirin 06/02/2014   PAF (paroxysmal atrial fibrillation) (HCC)    Gastroesophageal reflux disease without esophagitis 12/05/2011   Hx of CABG 2006 with Maze 07/21/2011   Mitral regurgitation 07/21/2011   Allergic rhinitis 10/05/2010   Hyperlipidemia [id number]    Uncomplicated asthma [id number]    Orientation RESPIRATION BLADDER Height & Weight     Self, Time, Situation, Place  Normal Continent, External catheter Weight: 89 lb 15.2 oz (40.8 kg) Height:  '5\' 2"'$  (157.5 cm)  BEHAVIORAL SYMPTOMS/MOOD NEUROLOGICAL BOWEL NUTRITION STATUS      Continent Diet (See discharge summary)  AMBULATORY STATUS COMMUNICATION OF NEEDS Skin   Extensive Assist Verbally Other (Comment) (Extravasation and ecchymosis left arm, Extravation right anterior upper arm)                       Personal Care Assistance Level of Assistance  Bathing, Feeding, Dressing Bathing Assistance: Limited assistance Feeding assistance: Limited assistance Dressing Assistance: Limited assistance     Functional Limitations Info  Sight, Hearing, Speech Sight Info: Impaired Hearing Info: Impaired Speech Info: Adequate    SPECIAL CARE FACTORS FREQUENCY  PT (By licensed PT), OT (By licensed OT)     PT Frequency: 5X per week OT Frequency: 5x per week            Contractures Contractures Info: Not present    Additional Factors Info  Code Status, Allergies Code Status Info: Full Allergies Info: Other, Bactrim (Sulfamethoxazole-trimethoprim), Amitriptyline Hcl, Aspirin, Tape, Zetia (Ezetimibe)           Current Medications (01/19/2021):  This is the current hospital active medication list Current Facility-Administered Medications  Medication Dose Route Frequency Provider Last Rate Last Admin   0.9 %  sodium chloride infusion  250 mL Intravenous Continuous Werner Lean, MD   Stopped at 01/15/21  1230   acetaminophen (TYLENOL) tablet 650 mg  650 mg Oral Q4H PRN Barrett, Rhonda G, PA-C   650 mg at 01/18/21 1711   albuterol (PROVENTIL) (2.5 MG/3ML) 0.083% nebulizer solution 2.5 mg  2.5 mg Inhalation Q6H PRN Chandrasekhar, Mahesh A, MD       ALPRAZolam Duanne Moron) tablet 0.25 mg  0.25 mg Oral BID PRN Barrett, Evelene Croon, PA-C       apixaban (ELIQUIS) tablet 2.5 mg  2.5  mg Oral BID Camnitz, Will Hassell Done, MD   2.5 mg at 01/19/21 0924   budesonide (PULMICORT) nebulizer solution 0.5 mg  0.5 mg Inhalation BID Rudean Haskell A, MD   0.5 mg at 01/19/21 [id number]   cholecalciferol (VITAMIN D3) tablet 1,000 Units  1,000 Units Oral Daily Barrett, Evelene Croon, PA-C   1,000 Units at 01/19/21 [id number]   diazepam (VALIUM) tablet 2 mg  2 mg Oral Q6H PRN Barrett, Evelene Croon, PA-C       docusate sodium (COLACE) capsule 100 mg  100 mg Oral Daily Barrett, Rhonda G, PA-C   100 mg at 01/19/21 [id number]   feeding supplement (ENSURE ENLIVE / ENSURE PLUS) liquid 237 mL  237 mL Oral BID BM Barrett, Rhonda G, PA-C   237 mL at 01/19/21 0930   fluticasone (FLONASE) 50 MCG/ACT nasal spray 2 spray  2 spray Each Nare Daily Barrett, Rhonda G, PA-C   2 spray at 01/19/21 0930   guaiFENesin (MUCINEX) 12 hr tablet 600 mg  600 mg Oral BID Barrett, Rhonda G, PA-C   600 mg at 01/19/21 [id number]   guaiFENesin (ROBITUSSIN) 100 MG/5ML solution 100 mg  100 mg Oral BID PRN Barrett, Evelene Croon, PA-C       linaclotide (LINZESS) capsule 72 mcg  72 mcg Oral Q breakfast Barrett, Evelene Croon, PA-C   72 mcg at 01/18/21 0827   loratadine (CLARITIN) tablet 10 mg  10 mg Oral Daily Barrett, Rhonda G, PA-C   10 mg at 01/19/21 [id number]   metoprolol tartrate (LOPRESSOR) tablet 25 mg  25 mg Oral BID Constance Haw, MD   25 mg at 01/19/21 0925   nitroGLYCERIN (NITROSTAT) SL tablet 0.4 mg  0.4 mg Sublingual Q5 Min x 3 PRN Barrett, Rhonda G, PA-C       ondansetron (ZOFRAN) injection 4 mg  4 mg Intravenous Q6H PRN Barrett, Rhonda G, PA-C   4 mg at 01/14/21 1946   pantoprazole (PROTONIX) EC tablet 40 mg  40 mg Oral BID Barrett, Rhonda G, PA-C   40 mg at 01/19/21 0925   polyethylene glycol (MIRALAX / GLYCOLAX) packet 17 g  17 g Oral BID PRN Chandrasekhar, Mahesh A, MD       rosuvastatin (CRESTOR) tablet 40 mg  40 mg Oral Daily Barrett, Rhonda G, PA-C   40 mg at 01/19/21 [id number]   sodium chloride flush (NS) 0.9 % injection 3 mL  3 mL Intravenous Q12H  Baldwin Jamaica, PA-C   3 mL at 01/19/21 0930   thiamine (B-1) injection 100 mg  100 mg Intravenous Daily Elodia Florence., MD   100 mg at 01/19/21 [id number]   zolpidem (AMBIEN) tablet 5 mg  5 mg Oral QHS PRN Barrett, Evelene Croon, PA-C   5 mg at 01/19/21 0045     Discharge Medications: Please see discharge summary for a list of discharge medications.  Relevant Imaging Results:  Relevant Lab Results:   Additional Information SSN# [id number] Pt has been vaccinated however not boostered  Trula Ore,  LCSWA

## 2021-01-19 NOTE — Consult Note (Signed)
   [facility] Inpatient Consult   01/19/2021  [name] April 27, 1929 [id number]  [place] Organization [ACO] Patient: Medicare CMS DCE  Patient will be referred for follow by [facility] RN with traditional Medicare for any known or needs for transitional care needs for returning to post facility care or complex disease management.  For questions or referrals, please contact:   Natividad Brood, RN BSN [facility] Liaison  [phone] business mobile phone Toll free office [phone]  Fax number: [phone] [email or web address]

## 2021-01-19 NOTE — Progress Notes (Addendum)
Consult NOTE    [name]  [id number] DOB: 1929/05/22 DOA: 01/14/2021 PCP: Denita Lung, MD   Chief Complaint  Patient presents with   Loss of Consciousness   Brief Narrative:  87 year old female with history of CAD status post CABG, status post TAVR, [name]. fib presents to the ER for the second time in last 24 hours.  Had come to the ER last night for shortness of breath work-up was unremarkable was discharged home.  This morning patient had 2 syncopal episode once while in the bed and second 1 while on the table eating breakfast.  Lasted for few seconds and regained consciousness.  Did not complain of any chest pain.  Was brought to the ER.  Was found to have [name]. fib with slow responses and 6-second pauses.  Cardiology was notified.  Hospitalist was consulted for patient having poor appetite early satiety failure to thrive.  Labs show hemoglobin 11.7 high sensitive troponin was 16 and 19.  Metabolic panel largely unremarkable.  Assessment & Plan:   Principal Problem:   Sick sinus syndrome ([place]) Active Problems:   Hx of CABG 2006 with Maze   Atrial fibrillation with slow ventricular response (HCC)   Syncope   Anemia   Weakness   Protein-calorie malnutrition, severe   Nausea  Plan for discharge when bed ready to SNF.  Ok from medical standpoint.  She needs continued follow up outpatient for her failure to thrive and early satiety as noted below.  [name]. fib with slow ventricular response and pauses  Hypotension s/p pacemaker implantation 8/26.  Off pressors and now on medical floor. Per cardiology who is primary - eliquis being resumed  Failure to thrive  Early Satiety  Early satiety for the past several weeks - months Will plan for CT chest, abdomen, pelvis with multible cholodocholithiasis with mild dilation of the CBD, suggestion of gastric varices (gastritis?), fat containing inguinal hernias RUQ [place] with cholelithiasis - no evidence of acute cholecystitsi, increased  echogenicity Will continue to monitor, labs do no suggest obstruction - chronic findings of choledocholithiasis -> discussed with GI who did not think any intervention indicated at this point. She's on PPI for gastritis  TSH wnl, cortisol.  Iron, folate, B12, ferritin wnl.  Vitamin C (pending at time of discharge), b1 (wnl), zinc  (wnl)ordered by RD, appreciate dietitian assistance  Will need continued follow up outpatient - follow up with GI for findings on CT (varices vs gastritis and choledocholithiasis), follow up with PCP for her FTT/early satiety  Anemia with history of GI bleed in December 2021 suspected to be likely from diverticular.  Follow CBC - stable.  History of CAD status post CABG  hx Severe AS s/p TAVR  Hx CVA and CAS s/p CEA Noted, per cardiology Continue rosuvastatin  Hypokalemia  improved  DVT prophylaxis: SCD Code Status: full Family Communication daughter at bedside 8/30 Disposition:  Per primary team, cardiology  Procedures:  Echo IMPRESSIONS     1. Left ventricular ejection fraction, by estimation, is 55 to 60%. The  left ventricle has normal function. There is mild left ventricular  hypertrophy.   2. Right ventricular systolic function is mildly reduced. The right  ventricular size is normal.   3. The mitral valve is degenerative. Mild to moderate mitral valve  regurgitation. Severe mitral annular calcification.   4. Tricuspid valve regurgitation is mild to moderate.   5. Unable to assess TAVR valve hemodynamically due to limited acoustic  windows for imaging. Valve is  grossly normal in appearance with trivial  central prosthetic AI. No rocking or dehiscence.. The aortic valve has  been repaired/replaced. Aortic valve  regurgitation is trivial. There is [name] 23 mm Sapien prosthetic (TAVR) valve  present in the aortic position. Procedure Date: 01/03/2017.   Antimicrobials:  Anti-infectives (From admission, onward)    Start     Dose/Rate Route  Frequency Ordered Stop   01/15/21 2230  ceFAZolin (ANCEF) IVPB 1 g/50 mL premix        1 g 100 mL/hr over 30 Minutes Intravenous Every 6 hours 01/15/21 1823 01/16/21 1049   01/15/21 1600  gentamicin (GARAMYCIN) 80 mg in sodium chloride 0.9 % 500 mL irrigation        80 mg Irrigation To [facility] 01/15/21 1131 01/15/21 1708   01/15/21 1600  ceFAZolin (ANCEF) IVPB 2g/100 mL premix        2 g 200 mL/hr over 30 Minutes Intravenous To ShortStay [facility] 01/15/21 1131 01/15/21 1655          Subjective: No new complaints  Objective: Vitals:   01/19/21 0630 01/19/21 0823 01/19/21 0906 01/19/21 0925  BP:  130/61  (!) 127/57  Pulse:    72  Resp:  16    Temp:  98.3 F (36.8 C)    TempSrc:  Oral    SpO2:   100%   Weight: 40.8 kg     Height:        Intake/Output Summary (Last 24 hours) at 01/19/2021 1055 Last data filed at 01/19/2021 0919 Gross per 24 hour  Intake 120 ml  Output 900 ml  Net -780 ml   Filed Weights   01/17/21 0344 01/18/21 0344 01/19/21 0630  Weight: 40.6 kg 40.1 kg 40.8 kg    Examination:  General: No acute distress. cachetic Cardiovascular: Heart sounds show [name] regular rate, and rhythm Lungs: Clear to auscultation bilaterally Abdomen: Soft, nontender, nondistended  Neurological: Alert and oriented 3. Moves all extremities 4 . Cranial nerves II through XII grossly intact. Skin: Warm and dry. No rashes or lesions. Extremities: No clubbing or cyanosis. No edema.  Data Reviewed: I have personally reviewed following labs and imaging studies  CBC: Recent Labs  Lab 01/14/21 1643 01/14/21 2040 01/16/21 0234 01/17/21 0437 01/18/21 0113  WBC 6.4 8.5 8.3 8.5 9.0  NEUTROABS 5.4  --   --   --   --   HGB 11.7* 12.8 10.9* 11.8* 12.1  HCT 37.4 38.9 33.5* 35.6* 36.2  MCV 93.3 90.7 90.8 90.8 90.0  PLT 187 180 140* 148* [id number]    Basic Metabolic Panel: Recent Labs  Lab 01/14/21 1643 01/15/21 0607 01/15/21 1213 01/16/21 0234 01/17/21 0437  01/18/21 0113  NA 140 139  --  135 136 136  K 4.0 2.8* 3.2* 4.3 3.8 3.7  CL 104 102  --  105 98 101  CO2 25 25  --  21* 26 28  GLUCOSE 153* 114*  --  83 100* 120*  BUN 8 8  --  '9 9 10  '$ CREATININE 0.54 0.51  --  0.63 0.55 0.57  CALCIUM 9.6 9.8  --  9.2 9.7 9.5  MG  --  2.1  --  1.8 1.9 1.8  PHOS  --   --   --  2.5 2.6 2.8    GFR: Estimated Creatinine Clearance: 30.7 mL/min (by C-G formula based on SCr of 0.57 mg/dL).  Liver Function Tests: Recent Labs  Lab 01/15/21 0607 01/16/21 2125 01/18/21 0113  AST  $'18 23 18  'O$ ALT 15 8 <5  ALKPHOS 41 39 38  BILITOT 1.1 1.0 0.6  PROT 7.1 5.7* 6.1*  ALBUMIN 4.0 3.3* 3.2*    CBG: Recent Labs  Lab 01/14/21 2027  GLUCAP 142*     Recent Results (from the past 240 hour(s))  Resp Panel by RT-PCR (Flu [place] Mallozzi&B, Covid) Nasopharyngeal Swab     Status: None   Collection Time: 01/14/21  5:03 PM   Specimen: Nasopharyngeal Swab; Nasopharyngeal(NP) swabs in vial transport medium  Result Value Ref Range Status   SARS Coronavirus 2 by RT PCR NEGATIVE NEGATIVE Final    Comment: (NOTE) SARS-CoV-2 target nucleic acids are NOT DETECTED.  The SARS-CoV-2 RNA is generally detectable in upper respiratory specimens during the acute phase of infection. The lowest concentration of SARS-CoV-2 viral copies this assay can detect is 138 copies/mL. [name] negative result does not preclude SARS-Cov-2 infection and should not be used as the sole basis for treatment or other patient management decisions. [name] negative result may occur with  improper specimen collection/handling, submission of specimen other than nasopharyngeal swab, presence of viral mutation(s) within the areas targeted by this assay, and inadequate number of viral copies(<138 copies/mL). [name] negative result must be combined with clinical observations, patient history, and epidemiological information. The expected result is Negative.  Fact Sheet for Patients:   EntrepreneurPulse.com.au  Fact Sheet for Healthcare Providers:  IncredibleEmployment.be  This test is no t yet approved or cleared by the [place] FDA and  has been authorized for detection and/or diagnosis of SARS-CoV-2 by FDA under an Emergency Use Authorization (EUA). This EUA will remain  in effect (meaning this test can be used) for the duration of the COVID-19 declaration under Section 564(b)(1) of the Act, 21 U.S.C.section 360bbb-3(b)(1), unless the authorization is terminated  or revoked sooner.       Influenza [name] by PCR NEGATIVE NEGATIVE Final   Influenza B by PCR NEGATIVE NEGATIVE Final    Comment: (NOTE) The Xpert Xpress SARS-CoV-2/FLU/RSV plus assay is intended as an aid in the diagnosis of influenza from Nasopharyngeal swab specimens and should not be used as [name] sole basis for treatment. Nasal washings and aspirates are unacceptable for Xpert Xpress SARS-CoV-2/FLU/RSV testing.  Fact Sheet for Patients: EntrepreneurPulse.com.au  Fact Sheet for Healthcare Providers: IncredibleEmployment.be  This test is not yet approved or cleared by the [place] FDA and has been authorized for detection and/or diagnosis of SARS-CoV-2 by FDA under an Emergency Use Authorization (EUA). This EUA will remain in effect (meaning this test can be used) for the duration of the COVID-19 declaration under Section 564(b)(1) of the Act, 21 U.S.C. section 360bbb-3(b)(1), unless the authorization is terminated or revoked.  Performed at [facility], [place] [address], [place], [place] [address]   SARS CORONAVIRUS 2 (TAT 6-24 HRS) Nasopharyngeal Nasopharyngeal Swab     Status: None   Collection Time: 01/14/21  6:53 PM   Specimen: Nasopharyngeal Swab  Result Value Ref Range Status   SARS Coronavirus 2 NEGATIVE NEGATIVE Final    Comment: (NOTE) SARS-CoV-2 target nucleic acids are NOT DETECTED.  The  SARS-CoV-2 RNA is generally detectable in upper and lower respiratory specimens during the acute phase of infection. Negative results do not preclude SARS-CoV-2 infection, do not rule out co-infections with other pathogens, and should not be used as the sole basis for treatment or other patient management decisions. Negative results must be combined with clinical observations, patient history, and epidemiological information. The expected result  is Negative.  Fact Sheet for Patients: SugarRoll.be  Fact Sheet for Healthcare Providers: https://www.woods-mathews.com/  This test is not yet approved or cleared by the [place] FDA and  has been authorized for detection and/or diagnosis of SARS-CoV-2 by FDA under an Emergency Use Authorization (EUA). This EUA will remain  in effect (meaning this test can be used) for the duration of the COVID-19 declaration under Se ction 564(b)(1) of the Act, 21 U.S.C. section 360bbb-3(b)(1), unless the authorization is terminated or revoked sooner.  Performed at [facility], [place] [address], [place], [place] [address]   MRSA Next Gen by PCR, Nasal     Status: None   Collection Time: 01/14/21  8:30 PM   Specimen: Nasal Mucosa; Nasal Swab  Result Value Ref Range Status   MRSA by PCR Next Gen NOT DETECTED NOT DETECTED Final    Comment: (NOTE) The GeneXpert MRSA Assay (FDA approved for NASAL specimens only), is one component of [name] comprehensive MRSA colonization surveillance program. It is not intended to diagnose MRSA infection nor to guide or monitor treatment for MRSA infections. Test performance is not FDA approved in patients less than 85 years old. Performed at [facility], [place] [address], [place], [place] [address]   Surgical PCR screen     Status: None   Collection Time: 01/15/21 11:45 AM   Specimen: Nasal Mucosa; Nasal Swab  Result Value Ref Range Status   MRSA, PCR NEGATIVE NEGATIVE  Final   Staphylococcus aureus NEGATIVE NEGATIVE Final    Comment: (NOTE) The Xpert SA Assay (FDA approved for NASAL specimens in patients 38 years of age and older), is one component of [name] comprehensive surveillance program. It is not intended to diagnose infection nor to guide or monitor treatment. Performed at [facility], [place] [address], [place], [place] [address]   SARS CORONAVIRUS 2 (TAT 6-24 HRS) Nasopharyngeal Nasopharyngeal Swab     Status: None   Collection Time: 01/18/21  4:07 PM   Specimen: Nasopharyngeal Swab  Result Value Ref Range Status   SARS Coronavirus 2 NEGATIVE NEGATIVE Final    Comment: (NOTE) SARS-CoV-2 target nucleic acids are NOT DETECTED.  The SARS-CoV-2 RNA is generally detectable in upper and lower respiratory specimens during the acute phase of infection. Negative results do not preclude SARS-CoV-2 infection, do not rule out co-infections with other pathogens, and should not be used as the sole basis for treatment or other patient management decisions. Negative results must be combined with clinical observations, patient history, and epidemiological information. The expected result is Negative.  Fact Sheet for Patients: SugarRoll.be  Fact Sheet for Healthcare Providers: https://www.woods-mathews.com/  This test is not yet approved or cleared by the [place] FDA and  has been authorized for detection and/or diagnosis of SARS-CoV-2 by FDA under an Emergency Use Authorization (EUA). This EUA will remain  in effect (meaning this test can be used) for the duration of the COVID-19 declaration under Se ction 564(b)(1) of the Act, 21 U.S.C. section 360bbb-3(b)(1), unless the authorization is terminated or revoked sooner.  Performed at [facility], [place] [address], [place], [place] [address]          Radiology Studies: No results found.      Scheduled Meds:  apixaban  2.5 mg Oral BID    budesonide  0.5 mg Inhalation BID   cholecalciferol  1,000 Units Oral Daily   docusate sodium  100 mg Oral Daily   feeding supplement  237 mL Oral BID BM   fluticasone  2 spray Each Nare Daily   guaiFENesin  600 mg Oral BID   linaclotide  72 mcg Oral Q breakfast   loratadine  10 mg Oral Daily   metoprolol tartrate  25 mg Oral BID   pantoprazole  40 mg Oral BID   rosuvastatin  40 mg Oral Daily   sodium chloride flush  3 mL Intravenous Q12H   thiamine injection  100 mg Intravenous Daily   Continuous Infusions:  sodium chloride Stopped (01/15/21 1230)     LOS: 5 days    Time spent: over 30 min    Fayrene Helper, MD [facility]   To contact the attending provider between 7A-7P or the covering provider during after hours 7P-7A, please log into the web site [email or web address] and access using universal [place] password for that web site. If you do not have the password, please call the hospital operator.  01/19/2021, 10:55 AM

## 2021-01-20 ENCOUNTER — Telehealth: Payer: Self-pay

## 2021-01-20 LAB — VITAMIN C: Vitamin C: <0.1 mg/dL — ABNORMAL LOW (ref 0.4–2.0)

## 2021-01-20 NOTE — Telephone Encounter (Signed)
Transition Care Management Unsuccessful Follow-up Telephone Call  Date of discharge and from where:  [name] 01/19/21  Attempts:  1st Attempt  Reason for unsuccessful TCM follow-up call:  Left voice message

## 2021-01-21 ENCOUNTER — Encounter: Payer: Self-pay | Admitting: Internal Medicine

## 2021-01-21 ENCOUNTER — Non-Acute Institutional Stay (SKILLED_NURSING_FACILITY): Payer: Medicare Other | Admitting: Internal Medicine

## 2021-01-21 DIAGNOSIS — I4891 Unspecified atrial fibrillation: Secondary | ICD-10-CM

## 2021-01-21 DIAGNOSIS — E43 Unspecified severe protein-calorie malnutrition: Secondary | ICD-10-CM

## 2021-01-21 DIAGNOSIS — R55 Syncope and collapse: Secondary | ICD-10-CM

## 2021-01-21 DIAGNOSIS — R634 Abnormal weight loss: Secondary | ICD-10-CM | POA: Diagnosis not present

## 2021-01-21 NOTE — Progress Notes (Signed)
NURSING HOME LOCATION:  [facility] ROOM NUMBER:  [id number]   CODE STATUS:  Full Code  PCP:  Jill Alexanders MD  This is a comprehensive admission note to this SNFperformed on this date less than 30 days from date of admission. Included are preadmission medical/surgical history; reconciled medication list; family history; social history and comprehensive review of systems.  Corrections and additions to the records were documented. Comprehensive physical exam was also performed. Additionally a clinical summary was entered for each active diagnosis pertinent to this admission in the Problem List to enhance continuity of care.  HPI: She was hospitalized 8/25 - 01/19/2021 with syncope and bradycardia.   Apparently she initially presented to the ED the evening of 8/24 because of shortness of breath; evaluation did not reveal a definitive etiology.  She was found to be in A. fib with controlled ventricular rate. She returned home and experienced syncope while sitting at the kitchen table.  She fell forward resulting in a small wound in the area of her glasses. Cardiology consulted for A. fib  associated with  symptomatic pauses of over 6 seconds . She was placed on external pacing pads ;she did validate feeling the pacing without significant discomfort.  DBA was initiated with goal of keeping heart rate greater than 50.  External pacing was continued for any heart rate less than 40. Significant hypertension was documented with systolics ranging from [id number] up to 183. On 8/26 dual-chamber pacemaker was implanted. According to family she recently had been eating poorly, complaining of early satiety.  This has been associated with loss of weight from 111 pounds last September to 89 pounds at the time of admission.  Severe protein/caloric malnutrition with deconditioning was clinically present.  Imaging revealing cholelithiasis with CBD dilation.  This was felt to be chronic rather than  acute.  GI recommended PPI without aggressive intervention at this time.  Symptoms did improve with small meals. She was discharged to the SNF for PT/OT rehab.  Past medical and surgical history: Includes history of aortic stenosis,s/p TAVR ;history of asthma;, COPD/bronchitis; CAD with history of MI; carotid artery disease; diastolic dysfunction; fibromyalgia; GERD; AF S/P MAZE;and history of stroke. In addition to aortic valve replacement she has had coronary angioplasty with stent placement , CEA,and coronary artery bypass grafting.    Social history: Nondrinker, never smoked.  Family history: Family history is noncontributory due to advanced age.   Review of systems: Clinical neurocognitive deficits made validity of responses questionable .  Although she gave the correct date she was unable to tell me why she had been hospitalized.  She initially said that she "could not hardly breathe".  She went on to discuss that her air conditioning system was put in 34 years ago but failed apparently.  She stated that her home was being cooled by 2 window fans which were not adequate.  She also mentioned having the valve procedure last Wednesday.  Her daughter came in later and stated that the aortic valve replacement was in 2018. Her daughter states that she has had occasional dysphagia.  She also stated that her mother had been hospitalized in December 2021 with a rectal bleed for which she received 3 units of packed cells.  Apparently no EGD was done and at that time.  She has had a colonoscopy. The daughter states that she has not been taking the Valium.  Apparently while hospitalized she was on Ambien but this was also discontinued.  Physical exam:  Pertinent or positive findings: She appears frankly cachectic with subcutaneous wasting, limb atrophy, and marked interosseous wasting.  She is hard of hearing.  She speaks in a halting speech pattern.  The lacrimal glands are prominent , more so on the  left than the right.  Breath sounds are decreased.  She has a grade 1/2 systolic murmur.  Second heart sound is accentuated slightly.  Pedal pulses are decreased.  General appearance:  no acute distress, increased work of breathing is present.   Lymphatic: No lymphadenopathy about the head, neck, axilla. Eyes: No conjunctival inflammation or lid edema is present. There is no scleral icterus. Ears:  External ear exam shows no significant lesions or deformities.   Nose:  External nasal examination shows no deformity or inflammation. Nasal mucosa are pink and moist without lesions, exudates Oral exam: Lips and gums are healthy appearing.There is no oropharyngeal erythema or exudate. Neck:  No thyromegaly, masses, tenderness noted.    Heart:  No gallop, click, rub.  Lungs:  without wheezes, rhonchi, rales, rubs. Abdomen: Bowel sounds are normal.  Abdomen is soft and nontender with no organomegaly, hernias, masses. GU: Deferred  Extremities:  No cyanosis, clubbing, edema. Neurologic exam: Balance, Rhomberg, finger to nose testing could not be completed due to clinical state Skin: Warm & dry w/o tenting. No significant lesions or rash.  See clinical summary under each active problem in the Problem List with associated updated therapeutic plan

## 2021-01-21 NOTE — Patient Instructions (Signed)
See assessment and plan under each diagnosis in the problem list and acutely for this visit 

## 2021-01-23 NOTE — Assessment & Plan Note (Signed)
Paced rhythm  

## 2021-01-23 NOTE — Assessment & Plan Note (Signed)
No recurrence post pacer implantation.

## 2021-01-23 NOTE — Assessment & Plan Note (Signed)
Interosseous wasting & limb atrophy present. SNF Nutritionist will assess.

## 2021-01-23 NOTE — Assessment & Plan Note (Signed)
GI evaluation recommended if weight loss persists because of hx GI bleed ; intermittent dysphagia; & clinical cachexia.

## 2021-01-28 ENCOUNTER — Ambulatory Visit (INDEPENDENT_AMBULATORY_CARE_PROVIDER_SITE_OTHER): Payer: Medicare Other

## 2021-01-28 ENCOUNTER — Other Ambulatory Visit: Payer: Self-pay

## 2021-01-28 ENCOUNTER — Telehealth (HOSPITAL_COMMUNITY): Payer: Self-pay | Admitting: Physician Assistant

## 2021-01-28 DIAGNOSIS — I495 Sick sinus syndrome: Secondary | ICD-10-CM

## 2021-01-28 LAB — CUP PACEART INCLINIC DEVICE CHECK
Battery Remaining Longevity: 85 mo
Battery Voltage: 3.05 V
Brady Statistic RA Percent Paced: 0.07 %
Brady Statistic RV Percent Paced: 93 %
Date Time Interrogation Session: 20220908134353
Implantable Lead Implant Date: 20220826
Implantable Lead Implant Date: 20220826
Implantable Lead Location: 753859
Implantable Lead Location: 753860
Implantable Pulse Generator Implant Date: 20220826
Lead Channel Impedance Value: 475 Ohm
Lead Channel Impedance Value: 600 Ohm
Lead Channel Pacing Threshold Amplitude: 0.5 V
Lead Channel Pacing Threshold Amplitude: 0.5 V
Lead Channel Pacing Threshold Pulse Width: 0.5 ms
Lead Channel Pacing Threshold Pulse Width: 0.5 ms
Lead Channel Sensing Intrinsic Amplitude: 12 mV
Lead Channel Sensing Intrinsic Amplitude: 2.1 mV
Lead Channel Setting Pacing Amplitude: 3.5 V
Lead Channel Setting Pacing Amplitude: 3.5 V
Lead Channel Setting Pacing Pulse Width: 0.5 ms
Lead Channel Setting Sensing Sensitivity: 2 mV
Pulse Gen Model: 2272
Pulse Gen Serial Number: 3953466

## 2021-01-28 NOTE — Telephone Encounter (Signed)
Called and left message for patient's daughter, [name] [facility].  Need to schedule an appt with [facility] per Trish Fountain, RN at [facility].  Pt having some AF on her device but appt has to be scheduled with daughter as pt is in SNF.

## 2021-01-28 NOTE — Progress Notes (Signed)
Wound check appointment. Steri-strips removed. Wound without redness or edema. Incision edges approximated, wound well healed. Normal device function. RV Thresholds, sensing, and impedances consistent with implant measurements. Device programmed at 3.5V for extra safety margin until 3 month visit. Histogram distribution appropriate for patient and level of activity. Pt noted in AF, CVR since 8/28. +OAC.   Reviewed with Dr. Curt Bears,  pt referred for AF clinic consult.  No high ventricular rates noted. Patient educated about wound care, arm mobility, lifting restrictions. Patient enrolled in remote monitoring, family educated to plug in monitor upon return to SNF today, next scheduled check 04/20/21.  ROV with Dr. Curt Bears on 04/20/21.

## 2021-01-28 NOTE — Patient Instructions (Signed)

## 2021-01-29 NOTE — Progress Notes (Signed)
Sent a letter due to no answer. [place]

## 2021-02-01 ENCOUNTER — Telehealth: Payer: Self-pay | Admitting: Family Medicine

## 2021-02-01 NOTE — Telephone Encounter (Signed)
Pt's daughter came by and states that [place] told her to take Vitamin c and per [name] pt can't take vitamin c due to acid reflux. She also states that most multi vitamins are too large for her. Do you have a recommendation of one not so large. Please advise [name].

## 2021-02-02 DIAGNOSIS — R1310 Dysphagia, unspecified: Secondary | ICD-10-CM | POA: Diagnosis not present

## 2021-02-02 DIAGNOSIS — R6881 Early satiety: Secondary | ICD-10-CM | POA: Diagnosis not present

## 2021-02-02 DIAGNOSIS — K8020 Calculus of gallbladder without cholecystitis without obstruction: Secondary | ICD-10-CM | POA: Diagnosis not present

## 2021-02-02 DIAGNOSIS — K8050 Calculus of bile duct without cholangitis or cholecystitis without obstruction: Secondary | ICD-10-CM | POA: Diagnosis not present

## 2021-02-02 DIAGNOSIS — R634 Abnormal weight loss: Secondary | ICD-10-CM | POA: Diagnosis not present

## 2021-02-02 DIAGNOSIS — K219 Gastro-esophageal reflux disease without esophagitis: Secondary | ICD-10-CM | POA: Diagnosis not present

## 2021-02-02 DIAGNOSIS — R627 Adult failure to thrive: Secondary | ICD-10-CM | POA: Diagnosis not present

## 2021-02-02 NOTE — Telephone Encounter (Signed)
Lvm for pt daughter to call back K[facility] 

## 2021-02-02 NOTE — Telephone Encounter (Signed)
Called and left 2nd VM for daughter, [facility], to call back to schedule appt.

## 2021-02-03 NOTE — Telephone Encounter (Signed)
Appt made for 02/04/21

## 2021-02-03 NOTE — Telephone Encounter (Signed)
Pt daughter was advised. [facility] 

## 2021-02-04 ENCOUNTER — Other Ambulatory Visit: Payer: Self-pay

## 2021-02-04 ENCOUNTER — Ambulatory Visit (HOSPITAL_COMMUNITY)
Admission: RE | Admit: 2021-02-04 | Discharge: 2021-02-04 | Disposition: A | Discharge status: Home / Self Care | Payer: Medicare Other | Source: Ambulatory Visit | Attending: Physician Assistant | Admitting: Physician Assistant

## 2021-02-04 ENCOUNTER — Encounter (HOSPITAL_COMMUNITY): Payer: Self-pay | Admitting: Physician Assistant

## 2021-02-04 VITALS — BP 114/50 | HR 70 | Ht 62.0 in | Wt 90.0 lb

## 2021-02-04 DIAGNOSIS — I10 Essential (primary) hypertension: Secondary | ICD-10-CM | POA: Diagnosis not present

## 2021-02-04 DIAGNOSIS — E785 Hyperlipidemia, unspecified: Secondary | ICD-10-CM | POA: Diagnosis not present

## 2021-02-04 DIAGNOSIS — Z8673 Personal history of transient ischemic attack (TIA), and cerebral infarction without residual deficits: Secondary | ICD-10-CM | POA: Diagnosis not present

## 2021-02-04 DIAGNOSIS — Z951 Presence of aortocoronary bypass graft: Secondary | ICD-10-CM | POA: Insufficient documentation

## 2021-02-04 DIAGNOSIS — Z952 Presence of prosthetic heart valve: Secondary | ICD-10-CM | POA: Insufficient documentation

## 2021-02-04 DIAGNOSIS — Z79899 Other long term (current) drug therapy: Secondary | ICD-10-CM | POA: Diagnosis not present

## 2021-02-04 DIAGNOSIS — I4819 Other persistent atrial fibrillation: Secondary | ICD-10-CM | POA: Insufficient documentation

## 2021-02-04 DIAGNOSIS — D6869 Other thrombophilia: Secondary | ICD-10-CM | POA: Diagnosis not present

## 2021-02-04 DIAGNOSIS — I350 Nonrheumatic aortic (valve) stenosis: Secondary | ICD-10-CM | POA: Diagnosis not present

## 2021-02-04 DIAGNOSIS — Z87891 Personal history of nicotine dependence: Secondary | ICD-10-CM | POA: Insufficient documentation

## 2021-02-04 DIAGNOSIS — Z7901 Long term (current) use of anticoagulants: Secondary | ICD-10-CM | POA: Diagnosis not present

## 2021-02-04 DIAGNOSIS — Z8249 Family history of ischemic heart disease and other diseases of the circulatory system: Secondary | ICD-10-CM | POA: Diagnosis not present

## 2021-02-04 DIAGNOSIS — I2510 Atherosclerotic heart disease of native coronary artery without angina pectoris: Secondary | ICD-10-CM | POA: Insufficient documentation

## 2021-02-04 NOTE — Progress Notes (Signed)
Primary Care Physician: Denita Lung, MD Primary Cardiologist: Dr Angelena Form  Primary Electrophysiologist: Dr Curt Bears Referring Physician: Dr Gerre Pebbles [name] is a 87 y.o. female with a history of CAD (CABG and MAZE 2006), prior stroke, PVD (R CEA ), VHD severe AS > TAVR (2018), HTN, HLD, sinus node dysfunction s/p PPM, atrial fibrillation who presents for follow up in the [facility]. Patient is on Eliquis for a CHADS2VASC score of 7. Patient had a PPM placed 01/15/21 for symptomatic bradycardia. She was in afib with significant pauses (10 seconds). The device clinic has noted that she has been persistently in afib since. Patient denies any awareness of her arrhythmia. She reports she is feeling stronger and is eager to go home from SNF. She denies any bleeding issues on anticoagulation. Her daughter reports that the patient has not been getting metoprolol due to hypotension.   Today, she denies symptoms of palpitations, chest pain, shortness of breath, orthopnea, PND, lower extremity edema, dizziness, presyncope, syncope, snoring, daytime somnolence, bleeding, or neurologic sequela. The patient is tolerating medications without difficulties and is otherwise without complaint today.    Atrial Fibrillation Risk Factors:  she does not have symptoms or diagnosis of sleep apnea. she does not have a history of rheumatic fever.   she has a BMI of Body mass index is 16.46 kg/m.Marland Kitchen Filed Weights   02/04/21 1507  Weight: 40.8 kg    Family History  Problem Relation Age of Onset   Heart failure Mother    Other Father      Atrial Fibrillation Management history:  Previous antiarrhythmic drugs: none Previous cardioversions: none Previous ablations: MAZE 2006 CHADS2VASC score: 7 Anticoagulation history: Eliquis   Past Medical History:  Diagnosis Date   Anxiety    Aortic Stenosis s/p TAVR    Echo 10/21: EF 55-60, no RWMA, mild asymmetric LVH, normal  RVSF, RVSP 40.2 mmHg, mild MR, s/p TAVR with mean gradient 8.30mHg, no PVL   Arthritis    OSTEO   Aspirin allergy    on Plavix   Asthma    Bronchitis    CAD (coronary artery disease)    a. s/p CABG 2006 with Cox-Maze procedure.   Carotid artery disease ([facility]    a. s/p R CEA.   Chronic stable angina (HCC)    Diastolic dysfunction    Eczema    Fibromyalgia    GERD (gastroesophageal reflux disease)    Heart murmur    Hiatal hernia    Hyperlipidemia    Hypertension    Kyphoscoliosis    Mitral regurgitation    Myocardial infarction ([facility] 2003   Stent to CFX   [place]    [place] pt has h/o hematuria on Eliquis and has since refused anticoagulation   Pneumonia 2015 ?   Pulmonary regurgitation    Skin cancer of arm    Stroke ([facility]    Tricuspid regurgitation    Past Surgical History:  Procedure Laterality Date   ABDOMINAL HYSTERECTOMY  1976   CAROTID ENDARTERECTOMY  2010   CORONARY ANGIOPLASTY WITH STENT PLACEMENT     CORONARY ARTERY BYPASS GRAFT  01/2005   LIMA-D1; SVG-LAD; SVG-OM; SVG-PDA   EYE SURGERY     MULTIPLE EXTRACTIONS WITH ALVEOLOPLASTY  12/28/2016   Extraction of tooth #'s 2- 5,7-10, [id number] with alveoloplasty and maxillary right and left lateral exostoses reductions   MULTIPLE EXTRACTIONS WITH ALVEOLOPLASTY N/A 12/28/2016   Procedure: Extraction of  tooth #'s 2- 5,7-10, 12,13,17-20,and 22-29 with alveoloplasty and maxillary right and left lateral exostoses reductions;  Surgeon: Lenn Cal, DDS;  Location: [place];  Service: Oral Surgery;  Laterality: N/A;   PACEMAKER IMPLANT N/A 01/15/2021   Procedure: PACEMAKER IMPLANT;  Surgeon: Constance Haw, MD;  Location: [place] [facility];  Service: Cardiovascular;  Laterality: N/A;   RIGHT/LEFT HEART CATH AND CORONARY/GRAFT ANGIOGRAPHY N/A 10/27/2016   Procedure: Right/Left Heart Cath and Coronary/Graft Angiography;  Surgeon: Burnell Blanks, MD;  Location: [place] [facility];  Service: Cardiovascular;  Laterality: N/A;   TEE WITHOUT CARDIOVERSION N/A 01/03/2017   Procedure: TRANSESOPHAGEAL ECHOCARDIOGRAM (TEE);  Surgeon: Burnell Blanks, MD;  Location: [place];  Service: Open Heart Surgery;  Laterality: N/A;   TONSILLECTOMY     TRANSCATHETER AORTIC VALVE REPLACEMENT, TRANSFEMORAL N/A 01/03/2017   Procedure: TRANSCATHETER AORTIC VALVE REPLACEMENT, TRANSFEMORAL;  Surgeon: Burnell Blanks, MD;  Location: [place];  Service: Open Heart Surgery;  Laterality: N/A;   TUMOR REMOVAL      Current Outpatient Medications  Medication Sig Dispense Refill   acetaminophen (TYLENOL) 650 MG CR tablet Take 1,300 mg by mouth 2 (two) times daily as needed for pain.      Cholecalciferol (VITAMIN D-3) 25 MCG (1000 UT) CAPS Take 1,000 Units by mouth daily.     diazepam (VALIUM) 2 MG tablet Take 1 tablet (2 mg total) by mouth every 6 (six) hours as needed for anxiety. 30 tablet 1   docusate sodium (COLACE) 100 MG capsule Take 1 capsule (100 mg total) by mouth daily. 60 capsule 0   ELIQUIS 2.5 MG TABS tablet TAKE 1 TABLET(2.5 MG) BY MOUTH TWICE DAILY 60 tablet 8   Ensure Plus (ENSURE PLUS) LIQD Take 237 mLs by mouth 2 (two) times daily between meals.     FLOVENT HFA 220 MCG/ACT inhaler INHALE 2 PUFFS INTO THE LUNGS TWICE DAILY 12 g 5   fluticasone (FLONASE) 50 MCG/ACT nasal spray SHAKE LIQUID AND USE 2 SPRAYS IN EACH NOSTRIL EVERY DAY 48 g 0   guaiFENesin (MUCINEX) 600 MG 12 hr tablet Take 1 tablet (600 mg total) by mouth 2 (two) times daily. 30 tablet 0   guaiFENesin (ROBITUSSIN) 100 MG/5ML liquid Take 100 mg by mouth 2 (two) times daily as needed for cough or congestion. Reported on 12/08/2015     LINZESS 72 MCG capsule Take 72 mcg by mouth daily.     loratadine (CLARITIN) 10 MG tablet Take 10 mg by mouth daily.       metoprolol tartrate (LOPRESSOR) 25 MG tablet Take 1 tablet (25 mg total) by mouth 2 (two) times daily. 180 tablet 3   nitroGLYCERIN (NITROSTAT) 0.4  MG SL tablet DISSOLVE 1 TABLET UNDER TONGUE EVERY 5 MINUTES AS NEEDED FOR CHEST PAIN 75 tablet 2   pantoprazole (PROTONIX) 40 MG tablet TAKE 1 TABLET BY MOUTH TWICE DAILY 180 tablet 0   polyethylene glycol powder (MIRALAX) 17 GM/SCOOP powder Take 17 g by mouth 2 (two) times daily as needed for moderate constipation. 255 g 2   rosuvastatin (CRESTOR) 40 MG tablet TAKE 1 TABLET BY MOUTH EVERY DAY 90 tablet 0   VENTOLIN HFA 108 (90 Base) MCG/ACT inhaler INHALE 2 PUFFS BY MOUTH EVERY 6 HOURS AS NEEDED FOR WHEEZING OR SHORTNESS OF BREATH 18 g 2   No current facility-administered medications for this encounter.    Allergies  Allergen Reactions   Other Other (See Comments)    NO ACIDIC, TART<  OR SPICY FOODS- DEVELOPS REFLUX OFTEN!!  PATIENT HAS TROUBLE SWALLOWING TABLETS!!   Bactrim [Sulfamethoxazole-Trimethoprim] Other (See Comments)    "makes her feel funny" or "unsteady"    Amitriptyline Hcl Rash   Aspirin Hives, Swelling, Rash and Other (See Comments)    Body became swollen   Tape Other (See Comments)    SKIN IS VERY THIN- WILL TEAR EASILY!!   Zetia [Ezetimibe] Rash    Social History   Socioeconomic History   Marital status: Widowed    Spouse name: Not on file   Number of children: 3   Years of education: Not on file   Highest education level: Not on file  Occupational History    Employer: RETIRED  Tobacco Use   Smoking status: Never   Smokeless tobacco: Former    Types: Snuff    Quit date: 05/23/2001   Tobacco comments:    Former Snuff (02/04/2021)  Vaping Use   Vaping Use: Never used  Substance and Sexual Activity   Alcohol use: No   Drug use: No   Sexual activity: Not Currently  Other Topics Concern   Not on file  Social History Narrative   Not on file   Social Determinants of Health   Financial Resource Strain: Not on file  Food Insecurity: Not on file  Transportation Needs: Not on file  Physical Activity: Not on file  Stress: Not on file  Social  Connections: Not on file  Intimate Partner Violence: Not on file     ROS- All systems are reviewed and negative except as per the HPI above.  Physical Exam: Vitals:   02/04/21 1507  BP: (!) 114/50  Pulse: 70  Weight: 40.8 kg  Height: '5\' 2"'$  (1.575 m)    GEN- The patient is a well appearing elderly female, alert and oriented x 3 today.   Head- normocephalic, atraumatic Eyes-  Sclera clear, conjunctiva pink Ears- hearing intact Oropharynx- clear Neck- supple  Lungs- Clear to ausculation bilaterally, normal work of breathing Heart- Regular rate and rhythm, no murmurs, rubs or gallops  GI- soft, NT, ND, + BS Extremities- no clubbing, cyanosis, or edema MS- no significant deformity or atrophy Skin- no rash or lesion Psych- euthymic mood, full affect Neuro- strength and sensation are intact  Wt Readings from Last 3 Encounters:  02/04/21 40.8 kg  01/19/21 40.8 kg  01/13/21 40.8 kg    EKG today demonstrates  V pacing with underlying afib Vent. rate 70 BPM PR interval * ms QRS duration 148 ms QT/QTcB 454/490 ms  Echo 01/15/21 demonstrated  1. Left ventricular ejection fraction, by estimation, is 55 to 60%. The  left ventricle has normal function. There is mild left ventricular  hypertrophy.   2. Right ventricular systolic function is mildly reduced. The right  ventricular size is normal.   3. The mitral valve is degenerative. Mild to moderate mitral valve  regurgitation. Severe mitral annular calcification.   4. Tricuspid valve regurgitation is mild to moderate.   5. Unable to assess TAVR valve hemodynamically due to limited acoustic windows for imaging. Valve is grossly normal in appearance with trivial central prosthetic AI. No rocking or dehiscence.. The aortic valve has been repaired/replaced. Aortic valve regurgitation is trivial. There is a 23 mm Sapien prosthetic (TAVR) valve present in the aortic position. Procedure Date: 01/03/2017.   Epic records are reviewed at  length today  CHA2DS2-VASc Score = 7  The patient's score is based upon: CHF History: 0 HTN History: 1 Diabetes History:  0 Stroke History: 2 Vascular Disease History: 1 Age Score: 2 Gender Score: 1       ASSESSMENT AND PLAN: 1. Persistent Atrial Fibrillation (ICD10:  I48.19) The patient's CHA2DS2-VASc score is 7, indicating a 11.2% annual risk of stroke.   S/p MAZE 2006 Patient V pacing with underlying afib. Unclear duration, last ECG in [facility] 02/11/20. We discussed rate vs rhythm control today. Given patients age, rate control, and paucity of symptoms, will pursue a conservative rate control strategy. Patient and daughter are in agreement with plan. Continue Eliquis 2.5 mg BID Stop metoprolol due to hypotension.   2. Secondary Hypercoagulable State (ICD10:  D68.69) The patient is at significant risk for stroke/thromboembolism based upon her CHA2DS2-VASc Score of 7.  Continue Apixaban (Eliquis).   3. CAD S/p CABG No anginal symptoms.  4. HTN Now hypotensive. Stop BB as above.  5. Severe AS S/p TAVR   Follow up with Coletta Memos NP and Dr Curt Bears as scheduled.    [facility] [address] [place], [place] [address] [phone] 02/04/2021 3:14 PM

## 2021-02-08 ENCOUNTER — Non-Acute Institutional Stay (SKILLED_NURSING_FACILITY): Payer: Medicare Other | Admitting: Adult Health

## 2021-02-08 ENCOUNTER — Encounter: Payer: Self-pay | Admitting: Adult Health

## 2021-02-08 DIAGNOSIS — E43 Unspecified severe protein-calorie malnutrition: Secondary | ICD-10-CM

## 2021-02-08 DIAGNOSIS — I495 Sick sinus syndrome: Secondary | ICD-10-CM

## 2021-02-08 DIAGNOSIS — I4891 Unspecified atrial fibrillation: Secondary | ICD-10-CM | POA: Diagnosis not present

## 2021-02-08 DIAGNOSIS — K5909 Other constipation: Secondary | ICD-10-CM

## 2021-02-08 NOTE — Progress Notes (Signed)
Location:  [facility] Number: 902-I Place of Service:  SNF (31) Provider:  Durenda Age, DNP, FNP-BC  Patient Care Team: Denita Lung, MD as PCP - General (Family Medicine) Burnell Blanks, MD as PCP - Cardiology (Cardiology) Constance Haw, MD as PCP - Electrophysiology (Cardiology)  Extended Emergency Contact Information Primary Emergency Contact: Lorrin Mais Address: [place], [place] Phone: [phone] Relation: Daughter  Code Status:  FULL CODE  Goals of care: Advanced Directive information Advanced Directives 02/08/2021  Does Patient Have a Medical Advance Directive? No  Would patient like information on creating a medical advance directive? No - Patient declined  Pre-existing out of facility DNR order (yellow form or pink MOST form) -     Chief Complaint  Patient presents with   Acute Visit    Short term rehab    HPI:  Pt is a 87 y.o. female seen today for short-term rehabilitation visit. She was admitted to [facility] on 01/19/21 post hospital admission 01/14/2021 to 01/19/2021.  She has a PMH of CAD (CABG and maze 2006), prior stroke, PVD, hypertension and hyperlipidemia. She was recently losing weight with poor appetite and early satiety. She had a syncopal episode while going to eat a little and lost consciousness at that kitchen table for about 10 to 15 seconds.  She fell forward.  It was witnessed by her daughter who took her to the ER by EMS.  She had another syncopal episode while in ER when staff was trying to hook her to monitor.  LOC for a few seconds.  She was noted to be bradycardic in 30s.  She was noted in atrial fibrillation with slow ventricular rate on admission.  Placed on external pacing pads with backup rate of 40 and dopamine 5 mcg running which improved HR.  Metoprolol was held.  It was found out that patient has 2 weeks  history of unusual shortness of breath with dizzy spell.  Eliquis was held and treated with heparin.  Dr. Curt Bears was consulted and underwent placement of central dual-chamber pacemaker on 01/15/2021.  Eliquis was restarted.  Her hypotension improved with pacing.    She was seen in her room today.  Left chest pacemaker site wound healed.  She denies pain.  She complained of constipation.   Past Medical History:  Diagnosis Date   Anxiety    Aortic Stenosis s/p TAVR    Echo 10/21: EF 55-60, no RWMA, mild asymmetric LVH, normal RVSF, RVSP 40.2 mmHg, mild MR, s/p TAVR with mean gradient 8.16mmHg, no PVL   Arthritis    OSTEO   Aspirin allergy    on Plavix   Asthma    Bronchitis    CAD (coronary artery disease)    a. s/p CABG 2006 with Cox-Maze procedure.   Carotid artery disease ([place])    a. s/p R CEA.   Chronic stable angina (HCC)    Diastolic dysfunction    Eczema    Fibromyalgia    GERD (gastroesophageal reflux disease)    Heart murmur    Hiatal hernia    Hyperlipidemia    Hypertension    Kyphoscoliosis    Mitral regurgitation    Myocardial infarction [facility]) 2003   Stent to CFX   PAF (paroxysmal atrial fibrillation) ([place])    a. pt has h/o hematuria on Eliquis and has since refused anticoagulation   Pneumonia  2015 ?   Pulmonary regurgitation    Skin cancer of arm    Stroke [facility])    Tricuspid regurgitation    Past Surgical History:  Procedure Laterality Date   ABDOMINAL HYSTERECTOMY  1976   CAROTID ENDARTERECTOMY  2010   CORONARY ANGIOPLASTY WITH STENT PLACEMENT     CORONARY ARTERY BYPASS GRAFT  01/2005   LIMA-D1; SVG-LAD; SVG-OM; SVG-PDA   EYE SURGERY     MULTIPLE EXTRACTIONS WITH ALVEOLOPLASTY  12/28/2016   Extraction of tooth #'s 2- 5,7-10, 83,38,25-05,LZJ 22-29 with alveoloplasty and maxillary right and left lateral exostoses reductions   MULTIPLE EXTRACTIONS WITH ALVEOLOPLASTY N/A 12/28/2016   Procedure: Extraction of tooth #'s 2- 5,7-10, 12,13,17-20,and 22-29 with  alveoloplasty and maxillary right and left lateral exostoses reductions;  Surgeon: Lenn Cal, DDS;  Location: [place];  Service: Oral Surgery;  Laterality: N/A;   PACEMAKER IMPLANT N/A 01/15/2021   Procedure: PACEMAKER IMPLANT;  Surgeon: Constance Haw, MD;  Location: [place] CV LAB;  Service: Cardiovascular;  Laterality: N/A;   RIGHT/LEFT HEART CATH AND CORONARY/GRAFT ANGIOGRAPHY N/A 10/27/2016   Procedure: Right/Left Heart Cath and Coronary/Graft Angiography;  Surgeon: Burnell Blanks, MD;  Location: [place] [facility] LAB;  Service: Cardiovascular;  Laterality: N/A;   TEE WITHOUT CARDIOVERSION N/A 01/03/2017   Procedure: TRANSESOPHAGEAL ECHOCARDIOGRAM (TEE);  Surgeon: Burnell Blanks, MD;  Location: [name];  Service: Open Heart Surgery;  Laterality: N/A;   TONSILLECTOMY     TRANSCATHETER AORTIC VALVE REPLACEMENT, TRANSFEMORAL N/A 01/03/2017   Procedure: TRANSCATHETER AORTIC VALVE REPLACEMENT, TRANSFEMORAL;  Surgeon: Burnell Blanks, MD;  Location: [place];  Service: Open Heart Surgery;  Laterality: N/A;   TUMOR REMOVAL      Allergies  Allergen Reactions   Other Other (See Comments)    NO ACIDIC, TART< OR SPICY FOODS- DEVELOPS REFLUX OFTEN!!  PATIENT HAS TROUBLE SWALLOWING TABLETS!!   Bactrim [Sulfamethoxazole-Trimethoprim] Other (See Comments)    "makes her feel funny" or "unsteady"    Amitriptyline Hcl Rash   Aspirin Hives, Swelling, Rash and Other (See Comments)    Body became swollen   Tape Other (See Comments)    SKIN IS VERY THIN- WILL TEAR EASILY!!   Zetia [Ezetimibe] Rash    Outpatient Encounter Medications as of 02/08/2021  Medication Sig   acetaminophen (TYLENOL) 650 MG CR tablet Take 1,300 mg by mouth 2 (two) times daily as needed for pain.    bisacodyl (DULCOLAX) 10 MG suppository Place 10 mg rectally as needed for moderate constipation.   Cholecalciferol (VITAMIN D-3) 25 MCG (1000 UT) CAPS Take 1,000 Units by mouth daily.   docusate sodium  (COLACE) 100 MG capsule Take 1 capsule (100 mg total) by mouth daily.   ELIQUIS 2.5 MG TABS tablet TAKE 1 TABLET(2.5 MG) BY MOUTH TWICE DAILY   Ensure Plus (ENSURE PLUS) LIQD Take 237 mLs by mouth 2 (two) times daily between meals.   FLOVENT HFA 220 MCG/ACT inhaler INHALE 2 PUFFS INTO THE LUNGS TWICE DAILY   fluticasone (FLONASE) 50 MCG/ACT nasal spray SHAKE LIQUID AND USE 2 SPRAYS IN EACH NOSTRIL EVERY DAY   guaiFENesin (MUCINEX) 600 MG 12 hr tablet Take 1 tablet (600 mg total) by mouth 2 (two) times daily.   guaiFENesin (ROBITUSSIN) 100 MG/5ML liquid Take 100 mg by mouth 2 (two) times daily as needed for cough or congestion. Reported on 12/08/2015   LINZESS 72 MCG capsule Take 72 mcg by mouth daily.   loratadine (CLARITIN) 10 MG tablet Take 10  mg by mouth daily.     Magnesium Hydroxide (MILK OF MAGNESIA PO) Take 30 mLs by mouth as needed.   nitroGLYCERIN (NITROSTAT) 0.4 MG SL tablet DISSOLVE 1 TABLET UNDER TONGUE EVERY 5 MINUTES AS NEEDED FOR CHEST PAIN   OVER THE COUNTER MEDICATION Take 1 Bottle by mouth daily. Magic Cup.   pantoprazole (PROTONIX) 40 MG tablet TAKE 1 TABLET BY MOUTH TWICE DAILY   polyethylene glycol powder (MIRALAX) 17 GM/SCOOP powder Take 17 g by mouth 2 (two) times daily as needed for moderate constipation.   rosuvastatin (CRESTOR) 40 MG tablet TAKE 1 TABLET BY MOUTH EVERY DAY   Sodium Phosphates (RA SALINE ENEMA RE) Place rectally as needed.   VENTOLIN HFA 108 (90 Base) MCG/ACT inhaler INHALE 2 PUFFS BY MOUTH EVERY 6 HOURS AS NEEDED FOR WHEEZING OR SHORTNESS OF BREATH   [DISCONTINUED] diazepam (VALIUM) 2 MG tablet Take 1 tablet (2 mg total) by mouth every 6 (six) hours as needed for anxiety.   No facility-administered encounter medications on file as of 02/08/2021.    Review of Systems  GENERAL: No change in appetite, no fatigue, no weight changes, no fever or chills  MOUTH and THROAT: Denies oral discomfort, gingival pain or bleeding RESPIRATORY: no cough, SOB, DOE,  wheezing, hemoptysis CARDIAC: No chest pain, edema or palpitations GI: + Constipation GU: Denies dysuria, frequency, hematuria, incontinence, or discharge NEUROLOGICAL: Denies dizziness, syncope, numbness, or headache PSYCHIATRIC: Denies feelings of depression or anxiety. No report of hallucinations, insomnia, paranoia, or agitation   Immunization History  Administered Date(s) Administered   Influenza Split 03/28/2012, 04/28/2015   Influenza Whole 02/23/2009   Influenza, High Dose Seasonal PF 03/14/2013, 03/03/2017, 03/16/2018, 02/18/2019   Influenza-Unspecified 03/16/2018   Pneumococcal Conjugate-13 06/14/2013   Pneumococcal Polysaccharide-23 12/22/2008   Tdap 10/11/2006, 07/14/2017, 11/09/2018   Zoster Recombinat (Shingrix) 09/10/2017, 12/12/2017   Zoster, Live 01/01/2009   Pertinent  Health Maintenance Due  Topic Date Due   DEXA SCAN  Never done   INFLUENZA VACCINE  12/21/2020   Fall Risk  01/06/2021 11/13/2019 11/02/2018 03/21/2018 03/27/2017  Falls in the past year? 0 1 0 No No  Comment - - - - -  Number falls in past yr: 0 0 - - -  Comment - - - - -  Injury with Fall? 0 0 - - -  Risk for fall due to : No Fall Risks - - - History of fall(s)  Follow up Falls evaluation completed - - - -     Vitals:   02/08/21 1351  BP: (!) 97/51  Pulse: 70  Resp: 19  Temp: (!) 97.5 F (36.4 C)  Weight: 89 lb 9.6 oz (40.6 kg)  Height: 5\' 2"  (1.575 m)   Body mass index is 16.39 kg/m.  Physical Exam  GENERAL APPEARANCE:  In no acute distress.  SKIN:  Skin is warm and dry.  MOUTH and THROAT: Lips are without lesions. Oral mucosa is moist and without lesions.  RESPIRATORY: Breathing is even & unlabored, BS CTAB CARDIAC: RRR, no murmur,no extra heart sounds, no edema.  Left chest pacemaker GI: Abdomen soft, normal BS, no masses, no tenderness EXTREMITIES: Able to move x4 extremities NEUROLOGICAL: There is no tremor. Speech is clear. Alert and oriented X 3. PSYCHIATRIC:  Affect and  behavior are appropriate  Labs reviewed: Recent Labs    01/16/21 0234 01/17/21 0437 01/18/21 0113  NA 135 136 136  K 4.3 3.8 3.7  CL 105 98 101  CO2 21* 26 28  GLUCOSE 83 100* 120*  BUN 9 9 10   CREATININE 0.63 0.55 0.57  CALCIUM 9.2 9.7 9.5  MG 1.8 1.9 1.8  PHOS 2.5 2.6 2.8   Recent Labs    01/15/21 0607 01/16/21 2125 01/18/21 0113  AST 18 23 18   ALT 15 8 <5  ALKPHOS 41 39 38  BILITOT 1.1 1.0 0.6  PROT 7.1 5.7* 6.1*  ALBUMIN 4.0 3.3* 3.2*   Recent Labs    04/22/20 1736 04/22/20 1746 01/14/21 1643 01/14/21 2040 01/16/21 0234 01/17/21 0437 01/18/21 0113  WBC 11.9*   < > 6.4   < > 8.3 8.5 9.0  NEUTROABS 9.6*  --  5.4  --   --   --   --   HGB 11.1*   < > 11.7*   < > 10.9* 11.8* 12.1  HCT 33.7*   < > 37.4   < > 33.5* 35.6* 36.2  MCV 91.3   < > 93.3   < > 90.8 90.8 90.0  PLT PLATELET CLUMPS NOTED ON SMEAR, UNABLE TO ESTIMATE   < > 187   < > 140* 148* 185   < > = values in this interval not displayed.   Lab Results  Component Value Date   TSH 2.243 01/14/2021   Lab Results  Component Value Date   HGBA1C 5.9 (H) 01/14/2021   Lab Results  Component Value Date   CHOL 151 11/13/2019   HDL 77 11/13/2019   LDLCALC 58 11/13/2019   TRIG 84 11/13/2019   CHOLHDL 2.0 11/13/2019    Significant Diagnostic Results in last 30 days:  DG Chest 2 View  Result Date: 01/16/2021 CLINICAL DATA:  Pacemaker placement. EXAM: CHEST - 2 VIEW COMPARISON:  January 14, 2021. FINDINGS: The heart size and mediastinal contours are within normal limits. Both lungs are clear. Status post coronary bypass graft and transcatheter aortic valve repair. Interval placement of left-sided pacemaker with leads in grossly good position. Pneumothorax is noted. Minimal right pleural effusion may be present. The visualized skeletal structures are unremarkable. IMPRESSION: Interval placement of left-sided pacemaker. Minimal right pleural effusion. Aortic Atherosclerosis (ICD10-I70.0). Electronically Signed    By: Marijo Conception M.D.   On: 01/16/2021 08:52   CT CHEST ABDOMEN PELVIS W CONTRAST  Result Date: 01/16/2021 CLINICAL DATA:  failure to thrive, early satiety EXAM: CT CHEST, ABDOMEN, AND PELVIS WITH CONTRAST TECHNIQUE: Multidetector CT imaging of the chest, abdomen and pelvis was performed following the standard protocol during bolus administration of intravenous contrast. CONTRAST:  69mL OMNIPAQUE IOHEXOL 350 MG/ML SOLN COMPARISON:  April 22, 2020, November 01, 2016, July 25, 2003. FINDINGS: CT CHEST FINDINGS Cardiovascular: Heart is normal in size. Status post TAVR. Status post CABG. Atherosclerotic calcifications of the aorta and coronary arteries. No pericardial effusion. Mediastinum/Nodes: Multiple subcentimeter thyroid nodules, for which no specific follow-up is recommended. No mediastinal adenopathy. Lungs/Pleura: No pleural effusion or pneumothorax. Irregular partially calcified RIGHT apical scarring, grossly similar since 2005. Irregular 7 x 2 mm LEFT apical nodular opacity is unchanged since 2005, consistent with a benign etiology (series 4, image 31). Scattered endobronchial debris. Scattered calcified pulmonary nodules. 2 mm RIGHT lower lobe pulmonary nodule, stable since 2018, consistent with a benign etiology (series 4, image 100). Musculoskeletal: In the LEFT chest wall, there is subcutaneous air and a small amount of non organized fluid as well as LEFT inferior breast superficial edema. Streak artifact from pacemaker limits evaluation. This is most consistent with recent pacemaker insertion. CT ABDOMEN PELVIS FINDINGS Hepatobiliary:  Revisualization of multiple gallstones within the gallbladder. There are multiple high density foci within the common bile duct, similar in comparison to prior. Common bile duct remains mildly dilated at 10 mm. No intrahepatic biliary ductal dilation. Coarse calcification of the LEFT liver, consistent with sequela of remote prior trauma or granulomatous infection.  Pancreas: Unchanged mild prominence of the pancreatic duct. No peripancreatic fat stranding. Spleen: Innumerable tiny hypodense lesions of the spleen, nonspecific. Adrenals/Urinary Tract: Adrenal glands are unchanged mildly prominent in appearance. No hydronephrosis. Exophytic RIGHT renal cyst. Subcentimeter hypodense lesions are too small to accurately characterize. Bladder is unremarkable. Stomach/Bowel: No evidence of bowel obstruction. There are multiple varices in the stomach wall with early hyperenhancement of the prominent stomach folds. Diverticulosis. No evidence of appendicitis. Vascular/Lymphatic: Atherosclerotic calcifications of the nonaneurysmal aorta. Mild ectasia of the infrarenal abdominal aorta, unchanged in comparison to prior (no follow-up is recommended.). Reproductive: Status post hysterectomy. No adnexal masses. Other: No free air or free fluid.  Fat containing inguinal hernias. Musculoskeletal: Thoracolumbar scoliosis with marked levocurvature at the thoracolumbar junction. IMPRESSION: 1. Revisualization of multiple choledocholithiasis with mild dilation of the common bile duct. Recommend correlation with liver function tests. 2. There is early mucosal hyperenhancement of the smoothly thickened gastric folds with suggestion of underlying gastric varices. Findings are nonspecific and could reflect gastritis in the appropriate clinical setting. 3. Postsurgical changes of the LEFT chest wall adjacent to the pacemaker. Aortic Atherosclerosis (ICD10-I70.0). Electronically Signed   By: Valentino Saxon M.D.   On: 01/16/2021 19:48   EP PPM/ICD IMPLANT  Result Date: 01/15/2021 SURGEON:  Allegra Lai, MD   PREPROCEDURE DIAGNOSIS:  atrial fibrillation, heart block   POSTPROCEDURE DIAGNOSIS:  atrial fibrillation, heart block    PROCEDURES:  1. Pacemaker implantation.   INTRODUCTION:  HANAA PAYES is a 87 y.o. female with a history of bradycardia who presents today for pacemaker implantation.  The  patient reports intermittent episodes of dizziness over the past few months.  No reversible causes have been identified.  The patient therefore presents today for pacemaker implantation.   DESCRIPTION OF PROCEDURE:  Informed written consent was obtained, and  the patient was brought to the electrophysiology lab in a fasting state.  The patient required no sedation for the procedure today.  The patients left chest was prepped and draped in the usual sterile fashion by the EP lab staff. The skin overlying the left deltopectoral region was infiltrated with lidocaine for local analgesia.  A 4-cm incision was made over the left deltopectoral region.  A left subcutaneous pacemaker pocket was fashioned using a combination of sharp and blunt dissection. Electrocautery was required to assure hemostasis.  RA/RV Lead Placement: The left axillary vein was therefore cannulated.  Through the left axillary vein, a [employer] model Tendril MRI [id number] (serial number  [id number]) right atrial lead and an [employer] model Tendril MRI [id number] (serial number  [id number]) right ventricular lead were advanced with fluoroscopic visualization into the right atrial appendage and right ventricular apex positions respectively.  Initial atrial lead P- waves measured 3.4 mV with impedance of 430 ohms in atrial fibrillation.  Right ventricular lead was dependent with an impedance of 650 ohms and a threshold of 0.5 V at 0.5 msec.  Both leads were secured to the pectoralis fascia using #2-0 silk over the suture sleeves. Device Placement:  The leads were then connected to an Webster MRI  model [id number] (serial number  [id number] ) pacemaker.  The pocket was irrigated with  copious gentamicin solution.  The pacemaker was then placed into the pocket.  The pocket was then closed in 3 layers with 2.0 Vicryl suture for the 3.0 Vicryl suture subcutaneous and subcuticular layers.  Steri-  Strips and a sterile dressing were then applied.  EBL<57ml.  There were no early apparent complications.   CONCLUSIONS:  1. Successful implantation of a [employer] Assurity MRI dual-chamber pacemaker for symptomatic bradycardia  2. No early apparent complications.       Allegra Lai, MD 01/15/2021 5:28 PM   DG Chest Port 1 View  Result Date: 01/14/2021 CLINICAL DATA:  Atrial fibrillation and syncope EXAM: PORTABLE CHEST 1 VIEW COMPARISON:  01/13/2021 FINDINGS: Numerous leads and wires project over the chest. Prior median sternotomy. CABG. Midline trachea. Normal heart size. Atherosclerosis in the transverse aorta. Possible trace right pleural fluid. Right costophrenic angle partially excluded. No pneumothorax. No congestive failure. Right lung base calcified granuloma. No lobar consolidation. IMPRESSION: No acute cardiopulmonary disease. Aortic Atherosclerosis (ICD10-I70.0). Possible trace right pleural fluid. Electronically Signed   By: Abigail Miyamoto M.D.   On: 01/14/2021 17:42   DG Chest Port 1 View  Result Date: 01/13/2021 CLINICAL DATA:  Headache and nausea this morning. Anxious and short of breath throughout the day. EXAM: PORTABLE CHEST 1 VIEW COMPARISON:  04/23/2020 FINDINGS: Postoperative changes in the mediastinum. Normal heart size and pulmonary vascularity. Cardiac valve prosthesis. Emphysematous changes in the lungs. Fibrosis in the upper lungs. Peribronchial thickening suggesting bronchitic change. Calcified granuloma in the right base. Mild blunting of the right costophrenic angle is unchanged since prior study, likely pleural thickening. Calcification of the aorta. Thoracolumbar scoliosis with degenerative change. IMPRESSION: Emphysematous and chronic bronchitic changes in the lungs. Fibrosis in the apices. Right pleural thickening. No active pulmonary disease. Electronically Signed   By: Lucienne Capers M.D.   On: 01/13/2021 21:55   CUP PACEART INCLINIC DEVICE CHECK  Result Date: 01/28/2021 Wound check appointment. Steri-strips  removed. Wound without redness or edema. Incision edges approximated, wound well healed. Normal device function. RV Thresholds, sensing, and impedances consistent with implant measurements. Device programmed at 3.5V for extra safety margin until 3 month visit. Histogram distribution appropriate for patient and level of activity. Pt noted in AF, CVR since 8/28. +OAC.   Reviewed with Dr. Curt Bears,  pt referred for AF clinic consult.  No high ventricular rates noted. Patient educated about wound care, arm mobility, lifting restrictions. Patient enrolled in remote monitoring, family educated to plug in monitor upon return to SNF today, next scheduled check 04/20/21.  ROV with Dr. Curt Bears on 04/20/21.Trena Platt, BSN, RN  ECHOCARDIOGRAM LIMITED  Result Date: 01/15/2021    ECHOCARDIOGRAM LIMITED REPORT   Patient Name:   ARDELLE HALIBURTON Date of Exam: 01/15/2021 Medical Rec #:  [id number]     Height:       62.0 in Accession #:    [id number]    Weight:       89.7 lb Date of Birth:  [birth year]    BSA:          1.360 m Patient Age:    87 years      BP:           119/44 mmHg Patient Gender: F             HR:           30 bpm. Exam Location:  Inpatient Procedure: Limited Echo, Cardiac Doppler and Color Doppler Indications:    Post TAVR evaluation  Z95.2  History:        Patient has prior history of Echocardiogram examinations, most                 recent 02/26/2020. CAD and Previous Myocardial Infarction, Prior                 CABG, Carotid Disease, Aortic Valve Disease, Arrythmias:Atrial                 Fibrillation and LBBB, Signs/Symptoms:Hypotension, Murmur and                 Syncope; Risk Factors:Hypertension and Dyslipidemia. GERD.                 Aortic Valve: 23 mm Sapien prosthetic, stented (TAVR) valve is                 present in the aortic position. Procedure Date: 01/03/2017.  Sonographer:    Darlina Sicilian RDCS Referring Phys: 15 RHONDA G BARRETT  Sonographer Comments: Externally paced at 30 bpm. Limited Apical  and Subcostal views due to external pacing. IMPRESSIONS  1. Left ventricular ejection fraction, by estimation, is 55 to 60%. The left ventricle has normal function. There is mild left ventricular hypertrophy.  2. Right ventricular systolic function is mildly reduced. The right ventricular size is normal.  3. The mitral valve is degenerative. Mild to moderate mitral valve regurgitation. Severe mitral annular calcification.  4. Tricuspid valve regurgitation is mild to moderate.  5. Unable to assess TAVR valve hemodynamically due to limited acoustic windows for imaging. Valve is grossly normal in appearance with trivial central prosthetic AI. No rocking or dehiscence.. The aortic valve has been repaired/replaced. Aortic valve regurgitation is trivial. There is a 23 mm Sapien prosthetic (TAVR) valve present in the aortic position. Procedure Date: 01/03/2017. FINDINGS  Left Ventricle: Left ventricular ejection fraction, by estimation, is 55 to 60%. The left ventricle has normal function. There is mild left ventricular hypertrophy. Right Ventricle: The right ventricular size is normal. Right ventricular systolic function is mildly reduced. Mitral Valve: The mitral valve is degenerative in appearance. There is moderate calcification of the mitral valve leaflet(s). Severe mitral annular calcification. Mild to moderate mitral valve regurgitation. Tricuspid Valve: The tricuspid valve is grossly normal. Tricuspid valve regurgitation is mild to moderate. Aortic Valve: Unable to assess TAVR valve hemodynamically due to limited acoustic windows for imaging. Valve is grossly normal in appearance with trivial central prosthetic AI. No rocking or dehiscence. The aortic valve has been repaired/replaced. Aortic  valve regurgitation is trivial. There is a 23 mm Sapien prosthetic, stented (TAVR) valve present in the aortic position. Procedure Date: 01/03/2017. Pulmonic Valve: The pulmonic valve was grossly normal. Pulmonic valve  regurgitation is mild to moderate. LEFT VENTRICLE PLAX 2D LVIDd:         3.20 cm LVIDs:         2.17 cm LV PW:         0.90 cm LV IVS:        1.10 cm LVOT diam:     2.20 cm LVOT Area:     3.80 cm  LEFT ATRIUM         Index LA diam:    2.70 cm 1.99 cm/m  PULMONIC VALVE PV Vmax:          1.97 m/s PV Peak grad:     15.5 mmHg PR End Diast Vel: 7.84 msec  TRICUSPID VALVE TR Peak grad:  23.8 mmHg TR Vmax:        244.00 cm/s  SHUNTS Systemic Diam: 2.20 cm Cherlynn Kaiser MD Electronically signed by Cherlynn Kaiser MD Signature Date/Time: 01/15/2021/11:27:15 AM    Final    [place] EKG SITE RITE  Result Date: 01/15/2021 If Site Rite image not attached, placement could not be confirmed due to current cardiac rhythm.  US Abdomen Limited RUQ (LIVER/GB)  Result Date: 01/17/2021 CLINICAL DATA:  History of abnormal CT. History of choledocholithiasis and cholelithiasis EXAM: ULTRASOUND ABDOMEN LIMITED RIGHT UPPER QUADRANT COMPARISON:  January 16, 2021 FINDINGS: Gallbladder: Limited assessment due to inability to optimally position patient. There are multiple cholelithiasis. Bladder wall thickness measures within normal limits at 2 mm. No pericholecystic fluid visualized. Patient with altered mental status; no sonographic Murphy sign noted by sonographer, but evaluation is limited due to patient mentation. Common bile duct: Diameter: Dilated at least 10 mm. Known choledocholithiasis are not well visualized. Liver: Limited assessment of the liver due to inability to optimally position patient. No focal lesion identified. Mildly increased parenchymal echogenicity. Portal vein is patent on color Doppler imaging with normal direction of blood flow towards the liver. Other: Partial visualization an exophytic RIGHT renal cyst, better evaluated on CT IMPRESSION: 1. Cholelithiasis with dilation of the common bile duct, as seen on CT. Known choledocholithiasis are not well visualized sonographically. No definitive sonographic evidence of  acute cholecystitis, but evaluation is limited secondary to patient inability to cooperate with exam. If persistent clinical concern for acute cholelithiasis or obstructing choledocholithiasis, HIDA scan would be useful in delineating cystic duct and common bile duct patency. 2. Mildly increased hepatic echogenicity as can be seen in hepatic steatosis. Electronically Signed   By: Valentino Saxon M.D.   On: 01/17/2021 10:03    Assessment/Plan  1. Sick sinus syndrome (HCC) -  S/P placement of central dual-chamber pacemaker on 01/15/2021  2. Protein-calorie malnutrition, severe Wt Readings from Last 3 Encounters:  02/08/21 89 lb 9.6 oz (40.6 kg)  02/04/21 90 lb (40.8 kg)  01/19/21 89 lb 15.2 oz (40.8 kg)   -Continue Magic cup supplementation  3. Atrial fibrillation with slow ventricular response (HCC) -Rate controlled, continue Eliquis  4. Chronic constipation -  will start on Senna-S 8.6/50 mg 3 times twice a day (when they have been daily, discontinue Colace -Continue MiraLAX 17 g daily as needed and Linzess 72 mcg daily     Family/ staff Communication:   Discussed plan of care with resident and charge nurse.  Labs/tests ordered: None  Goals of care:   Short-term care   Durenda Age, DNP, MSN, FNP-BC [facility] [phone] (Monday-Friday 8:00 a.m. - 5:00 p.m.) [phone] (after hours)

## 2021-02-10 ENCOUNTER — Non-Acute Institutional Stay (SKILLED_NURSING_FACILITY): Payer: Medicare Other | Admitting: Adult Health

## 2021-02-10 ENCOUNTER — Encounter: Payer: Self-pay | Admitting: Adult Health

## 2021-02-10 DIAGNOSIS — K5909 Other constipation: Secondary | ICD-10-CM

## 2021-02-10 DIAGNOSIS — I495 Sick sinus syndrome: Secondary | ICD-10-CM | POA: Diagnosis not present

## 2021-02-10 DIAGNOSIS — Z8719 Personal history of other diseases of the digestive system: Secondary | ICD-10-CM

## 2021-02-10 DIAGNOSIS — I4891 Unspecified atrial fibrillation: Secondary | ICD-10-CM | POA: Diagnosis not present

## 2021-02-10 DIAGNOSIS — E43 Unspecified severe protein-calorie malnutrition: Secondary | ICD-10-CM | POA: Diagnosis not present

## 2021-02-10 DIAGNOSIS — J45909 Unspecified asthma, uncomplicated: Secondary | ICD-10-CM | POA: Diagnosis not present

## 2021-02-10 MED ORDER — SENNOSIDES-DOCUSATE SODIUM 8.6-50 MG PO TABS: 2.0000 | ORAL_TABLET | Freq: Every day | ORAL | 0 refills | Status: DC

## 2021-02-10 MED ORDER — NITROGLYCERIN 0.4 MG SL SUBL: SUBLINGUAL_TABLET | SUBLINGUAL | 0 refills | Status: DC

## 2021-02-10 MED ORDER — FLUTICASONE PROPIONATE HFA 220 MCG/ACT IN AERO: 2.0000 | INHALATION_SPRAY | Freq: Two times a day (BID) | RESPIRATORY_TRACT | 0 refills | Status: DC

## 2021-02-10 MED ORDER — ROSUVASTATIN CALCIUM 40 MG PO TABS: 40.0000 mg | ORAL_TABLET | Freq: Every day | ORAL | 0 refills | Status: DC

## 2021-02-10 MED ORDER — LINZESS 72 MCG PO CAPS: 72.0000 ug | ORAL_CAPSULE | Freq: Every day | ORAL | 0 refills | Status: DC

## 2021-02-10 MED ORDER — POLYETHYLENE GLYCOL 3350 17 GM/SCOOP PO POWD: 17.0000 g | Freq: Two times a day (BID) | ORAL | 0 refills | Status: DC | PRN

## 2021-02-10 MED ORDER — PANTOPRAZOLE SODIUM 40 MG PO TBEC: 40.0000 mg | DELAYED_RELEASE_TABLET | Freq: Two times a day (BID) | ORAL | 0 refills | Status: DC

## 2021-02-10 MED ORDER — FLUTICASONE PROPIONATE 50 MCG/ACT NA SUSP: NASAL | 0 refills | Status: DC

## 2021-02-10 MED ORDER — APIXABAN 2.5 MG PO TABS: ORAL_TABLET | ORAL | 8 refills | Status: DC

## 2021-02-10 MED ORDER — LORATADINE 10 MG PO TABS: 10.0000 mg | ORAL_TABLET | Freq: Every day | ORAL | 0 refills | Status: DC

## 2021-02-10 MED ORDER — VENTOLIN HFA 108 (90 BASE) MCG/ACT IN AERS: INHALATION_SPRAY | RESPIRATORY_TRACT | 0 refills | Status: DC

## 2021-02-10 NOTE — Progress Notes (Signed)
Location:  [place] Room Number: [place] of Service:  SNF (31) Provider:  Durenda Age, DNP, FNP-BC  Patient Care Team: Denita Lung, MD as PCP - General (Family Medicine) Burnell Blanks, MD as PCP - Cardiology (Cardiology) Constance Haw, MD as PCP - Electrophysiology (Cardiology)  Extended Emergency Contact Information Primary Emergency Contact: Lorrin Mais Address: [address], [place] Phone: [phone] Relation: Daughter  Code Status:   Full code  Goals of care: Advanced Directive information Advanced Directives 02/08/2021  Does Patient Have a Medical Advance Directive? No  Would patient like information on creating a medical advance directive? No - Patient declined  Pre-existing out of facility DNR order (yellow form or pink MOST form) -     Chief Complaint  Patient presents with   Discharge Note    For discharge home on 02/11/21 with Home health PT and OT    HPI:  Pt is a 87 y.o. female who is for discharge home 02/11/2022 with home health PT and OT.  She was admitted to [facility] on 01/19/2021 post hospital admission 8/25 to 01/19/2021.  He has a PMH of CAD (CABG and maze 2006), prior stroke, PVD, hypertension and hyperlipidemia.  She was recently losing weight with poor appetite and early satiety.  He had a syncopal episode while to eat eating a little and lost consciousness at the kitchen table for about 10 to 15 seconds.  He fell forward.  It was witnessed by her daughter who took her to the ER by EMS.  She had another syncopal episode while in the ER when staff was trying to poop her to the monitor.  LOC for a few seconds.  She was noted to be bradycardic in the 30s.  She was noted in atrial fibrillation with slow ventricular rates on admission.  Placed on external pacing pads with backup rate of 40 and dopamine 5 mcg running which improved  HR.  Metoprolol was held.  It was found out that patient had 2 weeks history of unusual shortness of breath with dizzy spell.  Eliquis was held and treated with heparin.  Dr. Curt Bears was consulted and underwent placement of central dual-chamber pacemaker on 01/15/2021.  Eliquis was restarted.  Her hypotension improved with pacing.  Sh e followed up to the heart clinic on 02/04/2021.  Metoprolol was discontinued due to hypotension.  Patient was admitted to this facility for short-term rehabilitation after the patient's recent hospitalization.  Patient has completed SNF rehabilitation and therapy has cleared the patient for discharge.   Past Medical History:  Diagnosis Date   Anxiety    Aortic Stenosis s/p TAVR    Echo 10/21: EF 55-60, no RWMA, mild asymmetric LVH, normal RVSF, RVSP 40.2 mmHg, mild MR, s/p TAVR with mean gradient 8.50mmHg, no PVL   Arthritis    OSTEO   Aspirin allergy    on Plavix   Asthma    Bronchitis    CAD (coronary artery disease)    a. s/p CABG 2006 with Cox-Maze procedure.   Carotid artery disease ([place])    a. s/p R CEA.   Chronic stable angina (HCC)    Diastolic dysfunction    Eczema    Fibromyalgia    GERD (gastroesophageal reflux disease)    Heart murmur    Hiatal hernia    Hyperlipidemia    Hypertension  Kyphoscoliosis    Mitral regurgitation    Myocardial infarction [facility]) 2003   Stent to CFX   PAF (paroxysmal atrial fibrillation) ([place])    a. pt has h/o hematuria on Eliquis and has since refused anticoagulation   Pneumonia 2015 ?   Pulmonary regurgitation    Skin cancer of arm    Stroke [facility])    Tricuspid regurgitation    Past Surgical History:  Procedure Laterality Date   ABDOMINAL HYSTERECTOMY  1976   CAROTID ENDARTERECTOMY  2010   CORONARY ANGIOPLASTY WITH STENT PLACEMENT     CORONARY ARTERY BYPASS GRAFT  01/2005   LIMA-D1; SVG-LAD; SVG-OM; SVG-PDA   EYE SURGERY     MULTIPLE EXTRACTIONS WITH ALVEOLOPLASTY  12/28/2016   Extraction of tooth  #'s 2- 5,7-10, 47,65,46-50,PTW 22-29 with alveoloplasty and maxillary right and left lateral exostoses reductions   MULTIPLE EXTRACTIONS WITH ALVEOLOPLASTY N/A 12/28/2016   Procedure: Extraction of tooth #'s 2- 5,7-10, 12,13,17-20,and 22-29 with alveoloplasty and maxillary right and left lateral exostoses reductions;  Surgeon: Lenn Cal, DDS;  Location: [place];  Service: Oral Surgery;  Laterality: N/A;   PACEMAKER IMPLANT N/A 01/15/2021   Procedure: PACEMAKER IMPLANT;  Surgeon: Constance Haw, MD;  Location: [place] CV LAB;  Service: Cardiovascular;  Laterality: N/A;   RIGHT/LEFT HEART CATH AND CORONARY/GRAFT ANGIOGRAPHY N/A 10/27/2016   Procedure: Right/Left Heart Cath and Coronary/Graft Angiography;  Surgeon: Burnell Blanks, MD;  Location: [place] CV LAB;  Service: Cardiovascular;  Laterality: N/A;   TEE WITHOUT CARDIOVERSION N/A 01/03/2017   Procedure: TRANSESOPHAGEAL ECHOCARDIOGRAM (TEE);  Surgeon: Burnell Blanks, MD;  Location: [facility];  Service: Open Heart Surgery;  Laterality: N/A;   TONSILLECTOMY     TRANSCATHETER AORTIC VALVE REPLACEMENT, TRANSFEMORAL N/A 01/03/2017   Procedure: TRANSCATHETER AORTIC VALVE REPLACEMENT, TRANSFEMORAL;  Surgeon: Burnell Blanks, MD;  Location: [place];  Service: Open Heart Surgery;  Laterality: N/A;   TUMOR REMOVAL      Allergies  Allergen Reactions   Other Other (See Comments)    NO ACIDIC, TART< OR SPICY FOODS- DEVELOPS REFLUX OFTEN!!  PATIENT HAS TROUBLE SWALLOWING TABLETS!!   Bactrim [Sulfamethoxazole-Trimethoprim] Other (See Comments)    "makes her feel funny" or "unsteady"    Amitriptyline Hcl Rash   Aspirin Hives, Swelling, Rash and Other (See Comments)    Body became swollen   Tape Other (See Comments)    SKIN IS VERY THIN- WILL TEAR EASILY!!   Zetia [Ezetimibe] Rash    Outpatient Encounter Medications as of 02/10/2021  Medication Sig   acetaminophen (TYLENOL) 650 MG CR tablet Take 1,300 mg by mouth 2  (two) times daily as needed for pain.    bisacodyl (DULCOLAX) 10 MG suppository Place 10 mg rectally as needed for moderate constipation.   Cholecalciferol (VITAMIN D-3) 25 MCG (1000 UT) CAPS Take 1,000 Units by mouth daily.   docusate sodium (COLACE) 100 MG capsule Take 1 capsule (100 mg total) by mouth daily.   ELIQUIS 2.5 MG TABS tablet TAKE 1 TABLET(2.5 MG) BY MOUTH TWICE DAILY   Ensure Plus (ENSURE PLUS) LIQD Take 237 mLs by mouth 2 (two) times daily between meals.   FLOVENT HFA 220 MCG/ACT inhaler INHALE 2 PUFFS INTO THE LUNGS TWICE DAILY   fluticasone (FLONASE) 50 MCG/ACT nasal spray SHAKE LIQUID AND USE 2 SPRAYS IN EACH NOSTRIL EVERY DAY   guaiFENesin (MUCINEX) 600 MG 12 hr tablet Take 1 tablet (600 mg total) by mouth 2 (two) times daily.   Rolan Lipa  72 MCG capsule Take 72 mcg by mouth daily.   loratadine (CLARITIN) 10 MG tablet Take 10 mg by mouth daily.     Magnesium Hydroxide (MILK OF MAGNESIA PO) Take 30 mLs by mouth as needed.   nitroGLYCERIN (NITROSTAT) 0.4 MG SL tablet DISSOLVE 1 TABLET UNDER TONGUE EVERY 5 MINUTES AS NEEDED FOR CHEST PAIN   OVER THE COUNTER MEDICATION Take 1 Bottle by mouth daily. Magic Cup.   pantoprazole (PROTONIX) 40 MG tablet TAKE 1 TABLET BY MOUTH TWICE DAILY   polyethylene glycol powder (MIRALAX) 17 GM/SCOOP powder Take 17 g by mouth 2 (two) times daily as needed for moderate constipation.   rosuvastatin (CRESTOR) 40 MG tablet TAKE 1 TABLET BY MOUTH EVERY DAY   Sodium Phosphates (RA SALINE ENEMA RE) Place rectally as needed.   VENTOLIN HFA 108 (90 Base) MCG/ACT inhaler INHALE 2 PUFFS BY MOUTH EVERY 6 HOURS AS NEEDED FOR WHEEZING OR SHORTNESS OF BREATH   [DISCONTINUED] guaiFENesin (ROBITUSSIN) 100 MG/5ML liquid Take 100 mg by mouth 2 (two) times daily as needed for cough or congestion. Reported on 12/08/2015   No facility-administered encounter medications on file as of 02/10/2021.    Review of Systems  GENERAL: No change in appetite, no fatigue, no weight  changes, no fever, chills or weakness MOUTH and THROAT: Denies oral discomfort, gingival pain or bleeding, RESPIRATORY: no cough, SOB, DOE, wheezing, hemoptysis CARDIAC: No chest pain, edema or palpitations GI: No abdominal pain, diarrhea, constipation, heart burn, nausea or vomiting GU: Denies dysuria, frequency, hematuria, incontinence, or discharge NEUROLOGICAL: Denies dizziness, syncope, numbness, or headache PSYCHIATRIC: Denies feelings of depression or anxiety. No report of hallucinations, insomnia, paranoia, or agitation   Immunization History  Administered Date(s) Administered   Influenza Split 03/28/2012, 04/28/2015   Influenza Whole 02/23/2009   Influenza, High Dose Seasonal PF 03/14/2013, 03/03/2017, 03/16/2018, 02/18/2019   Influenza-Unspecified 03/16/2018   Pneumococcal Conjugate-13 06/14/2013   Pneumococcal Polysaccharide-23 12/22/2008   Tdap 10/11/2006, 07/14/2017, 11/09/2018   Zoster Recombinat (Shingrix) 09/10/2017, 12/12/2017   Zoster, Live 01/01/2009   Pertinent  Health Maintenance Due  Topic Date Due   DEXA SCAN  Never done   INFLUENZA VACCINE  12/21/2020   Fall Risk  01/06/2021 11/13/2019 11/02/2018 03/21/2018 03/27/2017  Falls in the past year? 0 1 0 No No  Comment - - - - -  Number falls in past yr: 0 0 - - -  Comment - - - - -  Injury with Fall? 0 0 - - -  Risk for fall due to : No Fall Risks - - - History of fall(s)  Follow up Falls evaluation completed - - - -     Vitals:   02/10/21 1615  BP: 121/63  Pulse: 72  Resp: 17  Temp: 97.7 F (36.5 C)  Weight: 90 lb (40.8 kg)  Height: 5\' 2"  (1.575 m)   Body mass index is 16.46 kg/m.  Physical Exam  GENERAL APPEARANCE: In no acute distress.  Cachectic. SKIN:  Skin is warm and dry.  MOUTH and THROAT: Lips are without lesions. Oral mucosa is moist and without lesions.  RESPIRATORY: Breathing is even & unlabored, BS CTAB CARDIAC: RRR, no murmur,no extra heart sounds, no edema.  Left chest  pacemaker GI: Abdomen soft, normal BS, no masses, no tenderness, NEUROLOGICAL: There is no tremor. Speech is clear. Alert and oriented X 3. PSYCHIATRIC:  Affect and behavior are appropriate  Labs reviewed: Recent Labs    01/16/21 0234 01/17/21 0437 01/18/21 0113  NA  135 136 136  K 4.3 3.8 3.7  CL 105 98 101  CO2 21* 26 28  GLUCOSE 83 100* 120*  BUN 9 9 10   CREATININE 0.63 0.55 0.57  CALCIUM 9.2 9.7 9.5  MG 1.8 1.9 1.8  PHOS 2.5 2.6 2.8   Recent Labs    01/15/21 0607 01/16/21 2125 01/18/21 0113  AST 18 23 18   ALT 15 8 <5  ALKPHOS 41 39 38  BILITOT 1.1 1.0 0.6  PROT 7.1 5.7* 6.1*  ALBUMIN 4.0 3.3* 3.2*   Recent Labs    04/22/20 1736 04/22/20 1746 01/14/21 1643 01/14/21 2040 01/16/21 0234 01/17/21 0437 01/18/21 0113  WBC 11.9*   < > 6.4   < > 8.3 8.5 9.0  NEUTROABS 9.6*  --  5.4  --   --   --   --   HGB 11.1*   < > 11.7*   < > 10.9* 11.8* 12.1  HCT 33.7*   < > 37.4   < > 33.5* 35.6* 36.2  MCV 91.3   < > 93.3   < > 90.8 90.8 90.0  PLT PLATELET CLUMPS NOTED ON SMEAR, UNABLE TO ESTIMATE   < > 187   < > 140* 148* 185   < > = values in this interval not displayed.   Lab Results  Component Value Date   TSH 2.243 01/14/2021   Lab Results  Component Value Date   HGBA1C 5.9 (H) 01/14/2021   Lab Results  Component Value Date   CHOL 151 11/13/2019   HDL 77 11/13/2019   LDLCALC 58 11/13/2019   TRIG 84 11/13/2019   CHOLHDL 2.0 11/13/2019    Significant Diagnostic Results in last 30 days:  DG Chest 2 View  Result Date: 01/16/2021 CLINICAL DATA:  Pacemaker placement. EXAM: CHEST - 2 VIEW COMPARISON:  January 14, 2021. FINDINGS: The heart size and mediastinal contours are within normal limits. Both lungs are clear. Status post coronary bypass graft and transcatheter aortic valve repair. Interval placement of left-sided pacemaker with leads in grossly good position. Pneumothorax is noted. Minimal right pleural effusion may be present. The visualized skeletal  structures are unremarkable. IMPRESSION: Interval placement of left-sided pacemaker. Minimal right pleural effusion. Aortic Atherosclerosis (ICD10-I70.0). Electronically Signed   By: Marijo Conception M.D.   On: 01/16/2021 08:52   CT CHEST ABDOMEN PELVIS W CONTRAST  Result Date: 01/16/2021 CLINICAL DATA:  failure to thrive, early satiety EXAM: CT CHEST, ABDOMEN, AND PELVIS WITH CONTRAST TECHNIQUE: Multidetector CT imaging of the chest, abdomen and pelvis was performed following the standard protocol during bolus administration of intravenous contrast. CONTRAST:  47mL OMNIPAQUE IOHEXOL 350 MG/ML SOLN COMPARISON:  April 22, 2020, November 01, 2016, July 25, 2003. FINDINGS: CT CHEST FINDINGS Cardiovascular: Heart is normal in size. Status post TAVR. Status post CABG. Atherosclerotic calcifications of the aorta and coronary arteries. No pericardial effusion. Mediastinum/Nodes: Multiple subcentimeter thyroid nodules, for which no specific follow-up is recommended. No mediastinal adenopathy. Lungs/Pleura: No pleural effusion or pneumothorax. Irregular partially calcified RIGHT apical scarring, grossly similar since 2005. Irregular 7 x 2 mm LEFT apical nodular opacity is unchanged since 2005, consistent with a benign etiology (series 4, image 31). Scattered endobronchial debris. Scattered calcified pulmonary nodules. 2 mm RIGHT lower lobe pulmonary nodule, stable since 2018, consistent with a benign etiology (series 4, image 100). Musculoskeletal: In the LEFT chest wall, there is subcutaneous air and a small amount of non organized fluid as well as LEFT inferior breast superficial edema.  Streak artifact from pacemaker limits evaluation. This is most consistent with recent pacemaker insertion. CT ABDOMEN PELVIS FINDINGS Hepatobiliary: Revisualization of multiple gallstones within the gallbladder. There are multiple high density foci within the common bile duct, similar in comparison to prior. Common bile duct remains mildly  dilated at 10 mm. No intrahepatic biliary ductal dilation. Coarse calcification of the LEFT liver, consistent with sequela of remote prior trauma or granulomatous infection. Pancreas: Unchanged mild prominence of the pancreatic duct. No peripancreatic fat stranding. Spleen: Innumerable tiny hypodense lesions of the spleen, nonspecific. Adrenals/Urinary Tract: Adrenal glands are unchanged mildly prominent in appearance. No hydronephrosis. Exophytic RIGHT renal cyst. Subcentimeter hypodense lesions are too small to accurately characterize. Bladder is unremarkable. Stomach/Bowel: No evidence of bowel obstruction. There are multiple varices in the stomach wall with early hyperenhancement of the prominent stomach folds. Diverticulosis. No evidence of appendicitis. Vascular/Lymphatic: Atherosclerotic calcifications of the nonaneurysmal aorta. Mild ectasia of the infrarenal abdominal aorta, unchanged in comparison to prior (no follow-up is recommended.). Reproductive: Status post hysterectomy. No adnexal masses. Other: No free air or free fluid.  Fat containing inguinal hernias. Musculoskeletal: Thoracolumbar scoliosis with marked levocurvature at the thoracolumbar junction. IMPRESSION: 1. Revisualization of multiple choledocholithiasis with mild dilation of the common bile duct. Recommend correlation with liver function tests. 2. There is early mucosal hyperenhancement of the smoothly thickened gastric folds with suggestion of underlying gastric varices. Findings are nonspecific and could reflect gastritis in the appropriate clinical setting. 3. Postsurgical changes of the LEFT chest wall adjacent to the pacemaker. Aortic Atherosclerosis (ICD10-I70.0). Electronically Signed   By: Valentino Saxon M.D.   On: 01/16/2021 19:48   EP PPM/ICD IMPLANT  Result Date: 01/15/2021 SURGEON:  Allegra Lai, MD   PREPROCEDURE DIAGNOSIS:  atrial fibrillation, heart block   POSTPROCEDURE DIAGNOSIS:  atrial fibrillation, heart block     PROCEDURES:  1. Pacemaker implantation.   INTRODUCTION:  CATHLENE GARDELLA is a 87 y.o. female with a history of bradycardia who presents today for pacemaker implantation.  The patient reports intermittent episodes of dizziness over the past few months.  No reversible causes have been identified.  The patient therefore presents today for pacemaker implantation.   DESCRIPTION OF PROCEDURE:  Informed written consent was obtained, and  the patient was brought to the electrophysiology lab in a fasting state.  The patient required no sedation for the procedure today.  The patients left chest was prepped and draped in the usual sterile fashion by the EP lab staff. The skin overlying the left deltopectoral region was infiltrated with lidocaine for local analgesia.  A 4-cm incision was made over the left deltopectoral region.  A left subcutaneous pacemaker pocket was fashioned using a combination of sharp and blunt dissection. Electrocautery was required to assure hemostasis.  RA/RV Lead Placement: The left axillary vein was therefore cannulated.  Through the left axillary vein, a [employer] model Tendril MRI [id number] (serial number  [id number]) right atrial lead and an [employer] model Tendril MRI [id number] (serial number  [id number]) right ventricular lead were advanced with fluoroscopic visualization into the right atrial appendage and right ventricular apex positions respectively.  Initial atrial lead P- waves measured 3.4 mV with impedance of 430 ohms in atrial fibrillation.  Right ventricular lead was dependent with an impedance of 650 ohms and a threshold of 0.5 V at 0.5 msec.  Both leads were secured to the pectoralis fascia using #2-0 silk over the suture sleeves. Device Placement:  The leads were then connected to an  [employer] Assurity MRI  model [id number] (serial number  [id number] ) pacemaker.  The pocket was irrigated with copious gentamicin solution.  The pacemaker was then placed into the pocket.  The pocket  was then closed in 3 layers with 2.0 Vicryl suture for the 3.0 Vicryl suture subcutaneous and subcuticular layers.  Steri-  Strips and a sterile dressing were then applied. EBL<40ml.  There were no early apparent complications.   CONCLUSIONS:  1. Successful implantation of a [employer] Assurity MRI dual-chamber pacemaker for symptomatic bradycardia  2. No early apparent complications.       Allegra Lai, MD 01/15/2021 5:28 PM   DG Chest Port 1 View  Result Date: 01/14/2021 CLINICAL DATA:  Atrial fibrillation and syncope EXAM: PORTABLE CHEST 1 VIEW COMPARISON:  01/13/2021 FINDINGS: Numerous leads and wires project over the chest. Prior median sternotomy. CABG. Midline trachea. Normal heart size. Atherosclerosis in the transverse aorta. Possible trace right pleural fluid. Right costophrenic angle partially excluded. No pneumothorax. No congestive failure. Right lung base calcified granuloma. No lobar consolidation. IMPRESSION: No acute cardiopulmonary disease. Aortic Atherosclerosis (ICD10-I70.0). Possible trace right pleural fluid. Electronically Signed   By: Abigail Miyamoto M.D.   On: 01/14/2021 17:42   DG Chest Port 1 View  Result Date: 01/13/2021 CLINICAL DATA:  Headache and nausea this morning. Anxious and short of breath throughout the day. EXAM: PORTABLE CHEST 1 VIEW COMPARISON:  04/23/2020 FINDINGS: Postoperative changes in the mediastinum. Normal heart size and pulmonary vascularity. Cardiac valve prosthesis. Emphysematous changes in the lungs. Fibrosis in the upper lungs. Peribronchial thickening suggesting bronchitic change. Calcified granuloma in the right base. Mild blunting of the right costophrenic angle is unchanged since prior study, likely pleural thickening. Calcification of the aorta. Thoracolumbar scoliosis with degenerative change. IMPRESSION: Emphysematous and chronic bronchitic changes in the lungs. Fibrosis in the apices. Right pleural thickening. No active pulmonary disease.  Electronically Signed   By: Lucienne Capers M.D.   On: 01/13/2021 21:55   CUP PACEART INCLINIC DEVICE CHECK  Result Date: 01/28/2021 Wound check appointment. Steri-strips removed. Wound without redness or edema. Incision edges approximated, wound well healed. Normal device function. RV Thresholds, sensing, and impedances consistent with implant measurements. Device programmed at 3.5V for extra safety margin until 3 month visit. Histogram distribution appropriate for patient and level of activity. Pt noted in AF, CVR since 8/28. +OAC.   Reviewed with Dr. Curt Bears,  pt referred for AF clinic consult.  No high ventricular rates noted. Patient educated about wound care, arm mobility, lifting restrictions. Patient enrolled in remote monitoring, family educated to plug in monitor upon return to SNF today, next scheduled check 04/20/21.  ROV with Dr. Curt Bears on 04/20/21.Trena Platt, BSN, RN  ECHOCARDIOGRAM LIMITED  Result Date: 01/15/2021    ECHOCARDIOGRAM LIMITED REPORT   Patient Name:   [name] Date of Exam: 01/15/2021 Medical Rec #:  [id number]     Height:       62.0 in Accession #:    [id number]    Weight:       89.7 lb Date of Birth:  1929/04/04    BSA:          1.360 m Patient Age:    43 years      BP:           119/44 mmHg Patient Gender: F             HR:           30 bpm.  Exam Location:  Inpatient Procedure: Limited Echo, Cardiac Doppler and Color Doppler Indications:    Post TAVR evaluation Z95.2  History:        Patient has prior history of Echocardiogram examinations, most                 recent 02/26/2020. CAD and Previous Myocardial Infarction, Prior                 CABG, Carotid Disease, Aortic Valve Disease, Arrythmias:Atrial                 Fibrillation and LBBB, Signs/Symptoms:Hypotension, Murmur and                 Syncope; Risk Factors:Hypertension and Dyslipidemia. GERD.                 Aortic Valve: 23 mm Sapien prosthetic, stented (TAVR) valve is                 present in the aortic  position. Procedure Date: 01/03/2017.  Sonographer:    Darlina Sicilian RDCS Referring Phys: 57 RHONDA G BARRETT  Sonographer Comments: Externally paced at 30 bpm. Limited Apical and Subcostal views due to external pacing. IMPRESSIONS  1. Left ventricular ejection fraction, by estimation, is 55 to 60%. The left ventricle has normal function. There is mild left ventricular hypertrophy.  2. Right ventricular systolic function is mildly reduced. The right ventricular size is normal.  3. The mitral valve is degenerative. Mild to moderate mitral valve regurgitation. Severe mitral annular calcification.  4. Tricuspid valve regurgitation is mild to moderate.  5. Unable to assess TAVR valve hemodynamically due to limited acoustic windows for imaging. Valve is grossly normal in appearance with trivial central prosthetic AI. No rocking or dehiscence.. The aortic valve has been repaired/replaced. Aortic valve regurgitation is trivial. There is a 23 mm Sapien prosthetic (TAVR) valve present in the aortic position. Procedure Date: 01/03/2017. FINDINGS  Left Ventricle: Left ventricular ejection fraction, by estimation, is 55 to 60%. The left ventricle has normal function. There is mild left ventricular hypertrophy. Right Ventricle: The right ventricular size is normal. Right ventricular systolic function is mildly reduced. Mitral Valve: The mitral valve is degenerative in appearance. There is moderate calcification of the mitral valve leaflet(s). Severe mitral annular calcification. Mild to moderate mitral valve regurgitation. Tricuspid Valve: The tricuspid valve is grossly normal. Tricuspid valve regurgitation is mild to moderate. Aortic Valve: Unable to assess TAVR valve hemodynamically due to limited acoustic windows for imaging. Valve is grossly normal in appearance with trivial central prosthetic AI. No rocking or dehiscence. The aortic valve has been repaired/replaced. Aortic  valve regurgitation is trivial. There is a 23  mm Sapien prosthetic, stented (TAVR) valve present in the aortic position. Procedure Date: 01/03/2017. Pulmonic Valve: The pulmonic valve was grossly normal. Pulmonic valve regurgitation is mild to moderate. LEFT VENTRICLE PLAX 2D LVIDd:         3.20 cm LVIDs:         2.17 cm LV PW:         0.90 cm LV IVS:        1.10 cm LVOT diam:     2.20 cm LVOT Area:     3.80 cm  LEFT ATRIUM         Index LA diam:    2.70 cm 1.99 cm/m  PULMONIC VALVE PV Vmax:          1.97 m/s PV Peak grad:  15.5 mmHg PR End Diast Vel: 7.84 msec  TRICUSPID VALVE TR Peak grad:   23.8 mmHg TR Vmax:        244.00 cm/s  SHUNTS Systemic Diam: 2.20 cm Cherlynn Kaiser MD Electronically signed by Cherlynn Kaiser MD Signature Date/Time: 01/15/2021/11:27:15 AM    Final    [place] EKG SITE RITE  Result Date: 01/15/2021 If Site Rite image not attached, placement could not be confirmed due to current cardiac rhythm.  US Abdomen Limited RUQ (LIVER/GB)  Result Date: 01/17/2021 CLINICAL DATA:  History of abnormal CT. History of choledocholithiasis and cholelithiasis EXAM: ULTRASOUND ABDOMEN LIMITED RIGHT UPPER QUADRANT COMPARISON:  January 16, 2021 FINDINGS: Gallbladder: Limited assessment due to inability to optimally position patient. There are multiple cholelithiasis. Bladder wall thickness measures within normal limits at 2 mm. No pericholecystic fluid visualized. Patient with altered mental status; no sonographic Murphy sign noted by sonographer, but evaluation is limited due to patient mentation. Common bile duct: Diameter: Dilated at least 10 mm. Known choledocholithiasis are not well visualized. Liver: Limited assessment of the liver due to inability to optimally position patient. No focal lesion identified. Mildly increased parenchymal echogenicity. Portal vein is patent on color Doppler imaging with normal direction of blood flow towards the liver. Other: Partial visualization an exophytic RIGHT renal cyst, better evaluated on CT IMPRESSION: 1.  Cholelithiasis with dilation of the common bile duct, as seen on CT. Known choledocholithiasis are not well visualized sonographically. No definitive sonographic evidence of acute cholecystitis, but evaluation is limited secondary to patient inability to cooperate with exam. If persistent clinical concern for acute cholelithiasis or obstructing choledocholithiasis, HIDA scan would be useful in delineating cystic duct and common bile duct patency. 2. Mildly increased hepatic echogenicity as can be seen in hepatic steatosis. Electronically Signed   By: Valentino Saxon M.D.   On: 01/17/2021 10:03    Assessment/Plan  1. Sick sinus syndrome (HCC) -  S/P placement of central dual-chamber pacemaker on 01/15/2021 - rosuvastatin (CRESTOR) 40 MG tablet; Take 1 tablet (40 mg total) by mouth daily.  Dispense: 30 tablet; Refill: 0  2. Atrial fibrillation with slow ventricular response (HCC) -  Metoprolol was discontinued due to hypotension - apixaban (ELIQUIS) 2.5 MG TABS tablet; TAKE 1 TABLET(2.5 MG) BY MOUTH TWICE DAILY  Dispense: 60 tablet; Refill: 8  3. Protein-calorie malnutrition, severe -   Continue Ensure 237 mL twice a day  4. Chronic constipation - senna-docusate (SENNA PLUS) 8.6-50 MG tablet; Take 2 tablets by mouth daily.  Dispense: 60 tablet; Refill: 0 - polyethylene glycol powder (MIRALAX) 17 GM/SCOOP powder; Take 17 g by mouth 2 (two) times daily as needed for moderate constipation.  Dispense: 255 g; Refill: 0 - LINZESS 72 MCG capsule; Take 1 capsule (72 mcg total) by mouth daily.  Dispense: 30 capsule; Refill: 0  5. Asthma, unspecified asthma severity, unspecified whether complicated, unspecified whether persistent - fluticasone (FLOVENT HFA) 220 MCG/ACT inhaler; Inhale 2 puffs into the lungs 2 (two) times daily.  Dispense: 12 g; Refill: 0 - fluticasone (FLONASE) 50 MCG/ACT nasal spray; SHAKE LIQUID AND USE 2 SPRAYS IN EACH NOSTRIL EVERY DAY  Dispense: 48 g; Refill: 0 - loratadine  (CLARITIN) 10 MG tablet; Take 1 tablet (10 mg total) by mouth daily.  Dispense: 30 tablet; Refill: 0 - VENTOLIN HFA 108 (90 Base) MCG/ACT inhaler; Administer 2 puffs into the lungs every 6 hours as needed for wheezing and shortness of breath  Dispense: 18 g; Refill: 0  6. History of GI  bleed - pantoprazole (PROTONIX) 40 MG tablet; Take 1 tablet (40 mg total) by mouth 2 (two) times daily.  Dispense: 60 tablet; Refill: 0     I have filled out patient's discharge paperwork and e-prescribed medications.  Patient will have home health PT and OT.  DME provided:  [employer]  Total discharge time: Greater than 30 minutes Greater than 50% was spent in counseling and coordination of care.   Discharge time involved coordination of the discharge process with social worker, nursing staff and therapy department. Medical justification for home health services/DME verified.    Durenda Age, DNP, MSN, FNP-BC [facility] [phone] (Monday-Friday 8:00 a.m. - 5:00 p.m.) [phone] (after hours)

## 2021-02-15 ENCOUNTER — Telehealth: Payer: Self-pay

## 2021-02-15 DIAGNOSIS — I441 Atrioventricular block, second degree: Secondary | ICD-10-CM | POA: Diagnosis not present

## 2021-02-15 DIAGNOSIS — I10 Essential (primary) hypertension: Secondary | ICD-10-CM | POA: Diagnosis not present

## 2021-02-15 DIAGNOSIS — M1990 Unspecified osteoarthritis, unspecified site: Secondary | ICD-10-CM | POA: Diagnosis not present

## 2021-02-15 DIAGNOSIS — Z950 Presence of cardiac pacemaker: Secondary | ICD-10-CM | POA: Diagnosis not present

## 2021-02-15 DIAGNOSIS — R627 Adult failure to thrive: Secondary | ICD-10-CM | POA: Diagnosis not present

## 2021-02-15 DIAGNOSIS — E785 Hyperlipidemia, unspecified: Secondary | ICD-10-CM | POA: Diagnosis not present

## 2021-02-15 DIAGNOSIS — Z955 Presence of coronary angioplasty implant and graft: Secondary | ICD-10-CM | POA: Diagnosis not present

## 2021-02-15 DIAGNOSIS — F419 Anxiety disorder, unspecified: Secondary | ICD-10-CM | POA: Diagnosis not present

## 2021-02-15 DIAGNOSIS — Z681 Body mass index (BMI) 19 or less, adult: Secondary | ICD-10-CM | POA: Diagnosis not present

## 2021-02-15 DIAGNOSIS — Z951 Presence of aortocoronary bypass graft: Secondary | ICD-10-CM | POA: Diagnosis not present

## 2021-02-15 DIAGNOSIS — I4891 Unspecified atrial fibrillation: Secondary | ICD-10-CM | POA: Diagnosis not present

## 2021-02-15 DIAGNOSIS — K219 Gastro-esophageal reflux disease without esophagitis: Secondary | ICD-10-CM | POA: Diagnosis not present

## 2021-02-15 DIAGNOSIS — J45909 Unspecified asthma, uncomplicated: Secondary | ICD-10-CM | POA: Diagnosis not present

## 2021-02-15 DIAGNOSIS — Z8673 Personal history of transient ischemic attack (TIA), and cerebral infarction without residual deficits: Secondary | ICD-10-CM | POA: Diagnosis not present

## 2021-02-15 DIAGNOSIS — Z7901 Long term (current) use of anticoagulants: Secondary | ICD-10-CM | POA: Diagnosis not present

## 2021-02-15 DIAGNOSIS — I447 Left bundle-branch block, unspecified: Secondary | ICD-10-CM | POA: Diagnosis not present

## 2021-02-15 DIAGNOSIS — Z952 Presence of prosthetic heart valve: Secondary | ICD-10-CM | POA: Diagnosis not present

## 2021-02-15 DIAGNOSIS — I2510 Atherosclerotic heart disease of native coronary artery without angina pectoris: Secondary | ICD-10-CM | POA: Diagnosis not present

## 2021-02-15 DIAGNOSIS — E43 Unspecified severe protein-calorie malnutrition: Secondary | ICD-10-CM | POA: Diagnosis not present

## 2021-02-15 DIAGNOSIS — Z7951 Long term (current) use of inhaled steroids: Secondary | ICD-10-CM | POA: Diagnosis not present

## 2021-02-15 NOTE — Telephone Encounter (Signed)
[name] from home health is requesting verbal order O/T 2 week 3 and 1 week 6. Please advise [place].

## 2021-02-16 ENCOUNTER — Ambulatory Visit (HOSPITAL_BASED_OUTPATIENT_CLINIC_OR_DEPARTMENT_OTHER): Payer: Medicare Other | Admitting: Family

## 2021-02-16 ENCOUNTER — Other Ambulatory Visit: Payer: Self-pay | Admitting: Gastroenterology

## 2021-02-16 NOTE — Progress Notes (Deleted)
Cardiology Office Note:    Date:  02/16/2021   ID:  GEORGANNA [name], DOB 1928-11-23, MRN [id number]  PCP:  Denita Lung, MD   [facility] Providers Cardiologist:  Lauree Chandler, MD Electrophysiologist:  [name], MD { Click to update primary MD,subspecialty MD or APP then REFRESH:1}    Referring MD: Denita Lung, MD   Follow-up for sick sinus syndrome status post PPM 01/15/2021  History of Present Illness:    [name] is a 87 y.o. female with a hx of coronary artery disease status post CABG, atrial fibrillation status post Maze procedure, prior CVA status post right CEA, severe aortic stenosis status post TAVR.    She presented to the emergency department on 01/14/2021 with syncope and atrial fibrillation.  She was found to have frequent sinus pauses which were concerning for sick sinus syndrome.  Her LVEF improved to normal after pacemaker implant.  She was disc charged on Eliquis and metoprolol succinate 25 mg twice daily.  She presents to the clinic today for follow-up evaluation states***  *** denies chest pain, shortness of breath, lower extremity edema, fatigue, palpitations, melena, hematuria, hemoptysis, diaphoresis, weakness, presyncope, syncope, orthopnea, and PND.   Past Medical History:  Diagnosis Date   Anxiety    Aortic Stenosis s/p TAVR    Echo 10/21: EF 55-60, no RWMA, mild asymmetric LVH, normal RVSF, RVSP 40.2 mmHg, mild MR, s/p TAVR with mean gradient 8.75mmHg, no PVL   Arthritis    OSTEO   Aspirin allergy    on Plavix   Asthma    Bronchitis    CAD (coronary artery disease)    a. s/p CABG 2006 with Cox-Maze procedure.   Carotid artery disease ([place])    a. s/p R CEA.   Chronic stable angina (HCC)    Diastolic dysfunction    Eczema    Fibromyalgia    GERD (gastroesophageal reflux disease)    Heart murmur    Hiatal hernia    Hyperlipidemia    Hypertension    Kyphoscoliosis    Mitral regurgitation    Myocardial  infarction [facility]) 2003   Stent to CFX   PAF (paroxysmal atrial fibrillation) ([place])    a. pt has h/o hematuria on Eliquis and has since refused anticoagulation   Pneumonia 2015 ?   Pulmonary regurgitation    Skin cancer of arm    Stroke [facility])    Tricuspid regurgitation     Past Surgical History:  Procedure Laterality Date   ABDOMINAL HYSTERECTOMY  1976   CAROTID ENDARTERECTOMY  2010   CORONARY ANGIOPLASTY WITH STENT PLACEMENT     CORONARY ARTERY BYPASS GRAFT  01/2005   LIMA-D1; SVG-LAD; SVG-OM; SVG-PDA   EYE SURGERY     MULTIPLE EXTRACTIONS WITH ALVEOLOPLASTY  12/28/2016   Extraction of tooth #'s 2- 5,7-10, 25,63,89-37,DSK 22-29 with alveoloplasty and maxillary right and left lateral exostoses reductions   MULTIPLE EXTRACTIONS WITH ALVEOLOPLASTY N/A 12/28/2016   Procedure: Extraction of tooth #'s 2- 5,7-10, 12,13,17-20,and 22-29 with alveoloplasty and maxillary right and left lateral exostoses reductions;  Surgeon: Lenn Cal, DDS;  Location: [place];  Service: Oral Surgery;  Laterality: N/A;   PACEMAKER IMPLANT N/A 01/15/2021   Procedure: PACEMAKER IMPLANT;  Surgeon: Constance Haw, MD;  Location: [place] CV [facility];  Service: Cardiovascular;  Laterality: N/A;   RIGHT/LEFT HEART CATH AND CORONARY/GRAFT ANGIOGRAPHY N/A 10/27/2016   Procedure: Right/Left Heart Cath and Coronary/Graft Angiography;  Surgeon: Burnell Blanks, MD;  Location: [place] CV [facility];  Service: Cardiovascular;  Laterality: N/A;   TEE WITHOUT CARDIOVERSION N/A 01/03/2017   Procedure: TRANSESOPHAGEAL ECHOCARDIOGRAM (TEE);  Surgeon: Burnell Blanks, MD;  Location: [place];  Service: Open Heart Surgery;  Laterality: N/A;   TONSILLECTOMY     TRANSCATHETER AORTIC VALVE REPLACEMENT, TRANSFEMORAL N/A 01/03/2017   Procedure: TRANSCATHETER AORTIC VALVE REPLACEMENT, TRANSFEMORAL;  Surgeon: Burnell Blanks, MD;  Location: [place];  Service: Open Heart Surgery;  Laterality: N/A;   TUMOR REMOVAL       Current Medications: No outpatient medications have been marked as taking for the 02/19/21 encounter (Appointment) with Deberah Pelton, NP.     Allergies:   Other, Bactrim [sulfamethoxazole-trimethoprim], Amitriptyline hcl, Aspirin, Tape, and Zetia [ezetimibe]   Social History   Socioeconomic History   Marital status: Widowed    Spouse name: Not on file   Number of children: 3   Years of education: Not on file   Highest education level: Not on file  Occupational History    Employer: RETIRED  Tobacco Use   Smoking status: Never   Smokeless tobacco: Former    Types: Snuff    Quit date: 05/23/2001   Tobacco comments:    Former Snuff (02/04/2021)  Vaping Use   Vaping Use: Never used  Substance and Sexual Activity   Alcohol use: No   Drug use: No   Sexual activity: Not Currently  Other Topics Concern   Not on file  Social History Narrative   Not on file   Social Determinants of Health   Financial Resource Strain: Not on file  Food Insecurity: Not on file  Transportation Needs: Not on file  Physical Activity: Not on file  Stress: Not on file  Social Connections: Not on file     Family History: The patient's ***family history includes Heart failure in her mother; Other in her father.  ROS:   Please see the history of present illness.    *** All other systems reviewed and are negative.   Risk Assessment/Calculations:   {Does this patient have ATRIAL FIBRILLATION?:[phone]}       Physical Exam:    VS:  There were no vitals taken for this visit.    Wt Readings from Last 3 Encounters:  02/10/21 90 lb (40.8 kg)  02/08/21 89 lb 9.6 oz (40.6 kg)  02/04/21 90 lb (40.8 kg)     GEN: *** Well nourished, well developed in no acute distress HEENT: Normal NECK: No JVD; No carotid bruits LYMPHATICS: No lymphadenopathy CARDIAC: ***RRR, no murmurs, rubs, gallops RESPIRATORY:  Clear to auscultation without rales, wheezing or rhonchi  ABDOMEN: Soft, non-tender,  non-distended MUSCULOSKELETAL:  No edema; No deformity  SKIN: Warm and dry NEUROLOGIC:  Alert and oriented x 3 PSYCHIATRIC:  Normal affect    EKGs/Labs/Other Studies Reviewed:    The following studies were reviewed today:  Echocardiogram 01/15/2021 Sonographer Comments: Externally paced at 30 bpm. Limited Apical and  Subcostal views due to external pacing.  IMPRESSIONS     1. Left ventricular ejection fraction, by estimation, is 55 to 60%. The  left ventricle has normal function. There is mild left ventricular  hypertrophy.   2. Right ventricular systolic function is mildly reduced. The right  ventricular size is normal.   3. The mitral valve is degenerative. Mild to moderate mitral valve  regurgitation. Severe mitral annular calcification.   4. Tricuspid valve regurgitation is mild to moderate.   5. Unable to assess TAVR valve hemodynamically due  to limited acoustic  windows for imaging. Valve is grossly normal in appearance with trivial  central prosthetic AI. No rocking or dehiscence.. The aortic valve has  been repaired/replaced. Aortic valve  regurgitation is trivial. There is a 23 mm Sapien prosthetic (TAVR) valve  present in the aortic position. Procedure Date: 01/03/2017.     EKG:  EKG is *** ordered today.  The ekg ordered today demonstrates ***  Recent Labs: 01/14/2021: TSH 2.243 01/18/2021: ALT <5; BUN 10; Creatinine, Ser 0.57; Hemoglobin 12.1; Magnesium 1.8; Platelets 185; Potassium 3.7; Sodium 136  Recent Lipid Panel    Component Value Date/Time   CHOL 151 11/13/2019 1529   TRIG 84 11/13/2019 1529   HDL 77 11/13/2019 1529   CHOLHDL 2.0 11/13/2019 1529   CHOLHDL 2.0 08/21/2015 0001   VLDL 17 08/21/2015 0001   LDLCALC 58 11/13/2019 1529    ASSESSMENT & PLAN    Sick sinus syndrome/atrial fibrillation-no further episodes of syncope.  Underwent dual-chamber pacemaker implantation on 01/15/2021.  Follow-up echo showed normal LV function.  Reports compliance  with apixaban and denies bleeding issues. Continue apixaban, metoprolol Heart healthy low-sodium diet.   Increase physical activity as tolerated  Coronary artery disease-denies episodes of arm neck back or chest discomfort.  Status post CABG. Continue nitroglycerin, rosuvastatin, metoprolol Heart healthy low-sodium diet-salty 6 given Increase physical activity as tolerated  Essential hypertension-BP today***.  Well-controlled at home. Continue metoprolol Heart healthy low-sodium diet-salty 6 given Increase physical activity as tolerated  Malnutrition-noted to have severe protein malnutrition and deconditioning during hospitalization.  Started on PPI per GI during admission.  Her symptoms improved with eating small meals. Continues to eat frequent small meals at home. Follows with PCP  Disposition: Follow-up with Dr. Angelena Form or APP in 3-4 months.   {Are you ordering a CV Procedure (e.g. stress test, cath, DCCV, TEE, etc)?   Press F2        :[id number]}    Medication Adjustments/Labs and Tests Ordered: Current medicines are reviewed at length with the patient today.  Concerns regarding medicines are outlined above.  No orders of the defined types were placed in this encounter.  No orders of the defined types were placed in this encounter.   There are no Patient Instructions on file for this visit.   Signed, Deberah Pelton, NP  02/16/2021 7:54 AM      Notice: This dictation was prepared with Dragon dictation along with smaller phrase technology. Any transcriptional errors that result from this process are unintentional and may not be corrected upon review.  I spent***minutes examining this patient, reviewing medications, and using patient centered shared decision making involving her cardiac care.  Prior to her visit I spent greater than 20 minutes reviewing her past medical history,  medications, and prior cardiac tests.

## 2021-02-16 NOTE — Telephone Encounter (Signed)
[name] was advised. [place]

## 2021-02-17 ENCOUNTER — Telehealth: Payer: Self-pay | Admitting: Family Medicine

## 2021-02-17 ENCOUNTER — Telehealth: Payer: Self-pay

## 2021-02-17 DIAGNOSIS — E43 Unspecified severe protein-calorie malnutrition: Secondary | ICD-10-CM | POA: Diagnosis not present

## 2021-02-17 DIAGNOSIS — I4891 Unspecified atrial fibrillation: Secondary | ICD-10-CM | POA: Diagnosis not present

## 2021-02-17 DIAGNOSIS — R627 Adult failure to thrive: Secondary | ICD-10-CM | POA: Diagnosis not present

## 2021-02-17 DIAGNOSIS — J45909 Unspecified asthma, uncomplicated: Secondary | ICD-10-CM | POA: Diagnosis not present

## 2021-02-17 DIAGNOSIS — Z681 Body mass index (BMI) 19 or less, adult: Secondary | ICD-10-CM | POA: Diagnosis not present

## 2021-02-17 DIAGNOSIS — I2510 Atherosclerotic heart disease of native coronary artery without angina pectoris: Secondary | ICD-10-CM | POA: Diagnosis not present

## 2021-02-17 NOTE — Telephone Encounter (Signed)
   [facility] Pre-operative Risk Assessment    Patient Name: MARYJANE BENEDICT  DOB: May 21, 1929 MRN: [id number]  HEARTCARE STAFF:  - IMPORTANT!!!!!! Under Visit Info/Reason for Call, type in Other and utilize the format Clearance MM/DD/YY or Clearance TBD. Do not use dashes or single digits. - Please review there is not already an duplicate clearance open for this procedure. - If request is for dental extraction, please clarify the # of teeth to be extracted. - If the patient is currently at the dentist's office, call Pre-Op Callback Staff (MA/nurse) to input urgent request.  - If the patient is not currently in the dentist office, please route to the Pre-Op pool.  Request for surgical clearance:  What type of surgery is being performed? EGD  When is this surgery scheduled? 03/19/21  What type of clearance is required (medical clearance vs. Pharmacy clearance to hold med vs. Both)? PHARMACY  Are there any medications that need to be held prior to surgery and how long? ELIQUIS?  Practice name and name of physician performing surgery? [facility]; DR HUNG  What is the office phone number? [phone]   7.   What is the office fax number? [phone]  8.   Anesthesia type (None, local, MAC, general) ? PROPOFOL   Jacinta Shoe 02/17/2021, 9:49 AM  _________________________________________________________________   (provider comments below)

## 2021-02-17 NOTE — Telephone Encounter (Signed)
Therapist from [facility] called and wants to do strength training with pt for 1 time a week for 4 weeks. Mitzi Hansen can be reached at [phone]

## 2021-02-18 ENCOUNTER — Telehealth: Payer: Self-pay | Admitting: *Deleted

## 2021-02-18 ENCOUNTER — Encounter: Payer: Self-pay | Admitting: Family Medicine

## 2021-02-18 ENCOUNTER — Other Ambulatory Visit: Payer: Self-pay

## 2021-02-18 ENCOUNTER — Ambulatory Visit (INDEPENDENT_AMBULATORY_CARE_PROVIDER_SITE_OTHER): Payer: Medicare Other | Admitting: Family Medicine

## 2021-02-18 VITALS — BP 122/60 | HR 69 | Temp 98.5°F | Wt 87.4 lb

## 2021-02-18 DIAGNOSIS — R634 Abnormal weight loss: Secondary | ICD-10-CM

## 2021-02-18 DIAGNOSIS — R627 Adult failure to thrive: Secondary | ICD-10-CM | POA: Diagnosis not present

## 2021-02-18 DIAGNOSIS — Z23 Encounter for immunization: Secondary | ICD-10-CM

## 2021-02-18 DIAGNOSIS — I25119 Atherosclerotic heart disease of native coronary artery with unspecified angina pectoris: Secondary | ICD-10-CM | POA: Diagnosis not present

## 2021-02-18 NOTE — Telephone Encounter (Signed)
Patient with diagnosis of afib on Eliquis for anticoagulation.    Procedure: EGD Date of procedure: 03/19/21  CHA2DS2-VASc Score = 7   This indicates a 11.2% annual risk of stroke. The patient's score is based upon: CHF History: 0 HTN History: 1 Diabetes History: 0 Stroke History: 2 Vascular Disease History: 1 Age Score: 2 Gender Score: 1      CrCl 29.8 ml/min (rounding scr up to 0.8 and using actual body weight due to being under weight)  Per office protocol, patient can hold Eliquis for 1 day prior to procedure.    If longer hold is needed, will need MD input.

## 2021-02-18 NOTE — Progress Notes (Signed)
   Subjective:    Patient ID: [name], female    DOB: 07/25/1928, 87 y.o.   MRN: [id number]  HPI She is here for recheck.  She recently was released from rehab and center to help build up her strength but according to her both she and her daughter she does not think that her strength is improved much.  She is still having trouble eating.  She is being followed by GI for this and apparently has an endoscopy scheduled in the near future.  Geritol was recommended to help get vitamins in and also as an appetite stimulant but she is reluctant to have anything with alcohol.  She otherwise has no particular concerns or complaints.   Review of Systems     Objective:   Physical Exam Alert and in no distress otherwise not examined       Assessment & Plan:   Failure to thrive in adult  Need for influenza vaccination - Plan: Flu Vaccine QUAD High Dose(Fluad)  Need for COVID-19 vaccine - Plan: Moderna Covid-19 Vaccine Bivalent Booster  Unexplained weight loss Her immunizations were updated.  Encouraged eating anything that she can tolerate and did recommend trying Geritol again explained that it could hopefully help stimulate her appetite.

## 2021-02-18 NOTE — Telephone Encounter (Signed)
Pt returning your call, not sure if she can make tomorrows appt but would ike to know if she Is able to do it remotely... please advise

## 2021-02-19 ENCOUNTER — Ambulatory Visit (HOSPITAL_BASED_OUTPATIENT_CLINIC_OR_DEPARTMENT_OTHER): Payer: Medicare Other | Admitting: General Practice

## 2021-02-22 DIAGNOSIS — Z681 Body mass index (BMI) 19 or less, adult: Secondary | ICD-10-CM | POA: Diagnosis not present

## 2021-02-22 DIAGNOSIS — R627 Adult failure to thrive: Secondary | ICD-10-CM | POA: Diagnosis not present

## 2021-02-22 DIAGNOSIS — I4891 Unspecified atrial fibrillation: Secondary | ICD-10-CM | POA: Diagnosis not present

## 2021-02-22 DIAGNOSIS — J45909 Unspecified asthma, uncomplicated: Secondary | ICD-10-CM | POA: Diagnosis not present

## 2021-02-22 DIAGNOSIS — I2510 Atherosclerotic heart disease of native coronary artery without angina pectoris: Secondary | ICD-10-CM | POA: Diagnosis not present

## 2021-02-22 DIAGNOSIS — E43 Unspecified severe protein-calorie malnutrition: Secondary | ICD-10-CM | POA: Diagnosis not present

## 2021-02-22 NOTE — Progress Notes (Signed)
Cardiology Office Note:    Date:  02/24/2021   ID:  [name], DOB [birth year], MRN [id number]  PCP:  Denita Lung, MD   [facility] Providers Cardiologist:  Lauree Chandler, MD Electrophysiologist:  [name], MD      Referring MD: Denita Lung, MD   Follow-up for sick sinus syndrome status post [facility] 01/15/2021  History of Present Illness:    [name] is a 87 y.o. female with a hx of coronary artery disease status post CABG, atrial fibrillation status post Maze procedure, prior CVA status post right CEA, severe aortic stenosis status post TAVR.    She presented to the emergency department on 01/14/2021 with syncope and atrial fibrillation.  She was found to have frequent sinus pauses which were concerning for sick sinus syndrome.  Her LVEF improved to normal after pacemaker implant.  She was discharged on Eliquis and metoprolol succinate 25 mg twice daily.  She presents to the clinic today for follow-up evaluation states she feels well.  She presents with her daughter.  She states that she occasionally feels brief left-sided chest discomfort when she is laying down.  She occasionally uses sublingual nitroglycerin which alleviates the pain.  She reports this is ongoing and unchanged.  She denies exertional chest discomfort.  Her pacemaker site is well-healed.  She is limited in her physical activity due to leg weakness.  She continues to do physical therapy.  She denies bleeding issues.  We reviewed the recommendations for her Eliquis hold.  Her daughter expresses understanding.  I will have her continue to increase her physical activity as tolerated, continue heart healthy low-sodium diet, and follow-up in 3 to 4 months.  Today she denies chest pain, shortness of breath, lower extremity edema, fatigue, palpitations, melena, hematuria, hemoptysis, diaphoresis, weakness, presyncope, syncope, orthopnea, and PND.  Past Medical History:  Diagnosis Date    Anxiety    Aortic Stenosis s/p TAVR    Echo 10/21: EF 55-60, no RWMA, mild asymmetric LVH, normal RVSF, RVSP 40.2 mmHg, mild MR, s/p TAVR with mean gradient 8.67mmHg, no PVL   Arthritis    OSTEO   Aspirin allergy    on Plavix   Asthma    Bronchitis    CAD (coronary artery disease)    a. s/p CABG 2006 with Cox-Maze procedure.   Carotid artery disease ([place])    a. s/p R CEA.   Chronic stable angina (HCC)    Diastolic dysfunction    Eczema    Fibromyalgia    GERD (gastroesophageal reflux disease)    Heart murmur    Hiatal hernia    Hyperlipidemia    Hypertension    Kyphoscoliosis    Mitral regurgitation    Myocardial infarction [facility]) 2003   Stent to CFX   PAF (paroxysmal atrial fibrillation) ([place])    a. pt has h/o hematuria on Eliquis and has since refused anticoagulation   Pneumonia 2015 ?   Pulmonary regurgitation    Skin cancer of arm    Stroke [facility])    Tricuspid regurgitation     Past Surgical History:  Procedure Laterality Date   ABDOMINAL HYSTERECTOMY  1976   CAROTID ENDARTERECTOMY  2010   CORONARY ANGIOPLASTY WITH STENT PLACEMENT     CORONARY ARTERY BYPASS GRAFT  01/2005   LIMA-D1; SVG-LAD; SVG-OM; SVG-PDA   EYE SURGERY     MULTIPLE EXTRACTIONS WITH ALVEOLOPLASTY  12/28/2016   Extraction of tooth #'s 2- 5,7-10, 05,39,76-73,ALP 22-29 with alveoloplasty  and maxillary right and left lateral exostoses reductions   MULTIPLE EXTRACTIONS WITH ALVEOLOPLASTY N/A 12/28/2016   Procedure: Extraction of tooth #'s 2- 5,7-10, 12,13,17-20,and 22-29 with alveoloplasty and maxillary right and left lateral exostoses reductions;  Surgeon: Lenn Cal, DDS;  Location: [place];  Service: Oral Surgery;  Laterality: N/A;   PACEMAKER IMPLANT N/A 01/15/2021   Procedure: PACEMAKER IMPLANT;  Surgeon: Constance Haw, MD;  Location: [place] CV LAB;  Service: Cardiovascular;  Laterality: N/A;   RIGHT/LEFT HEART CATH AND CORONARY/GRAFT ANGIOGRAPHY N/A 10/27/2016   Procedure: Right/Left  Heart Cath and Coronary/Graft Angiography;  Surgeon: Burnell Blanks, MD;  Location: [place] CV LAB;  Service: Cardiovascular;  Laterality: N/A;   TEE WITHOUT CARDIOVERSION N/A 01/03/2017   Procedure: TRANSESOPHAGEAL ECHOCARDIOGRAM (TEE);  Surgeon: Burnell Blanks, MD;  Location: [place];  Service: Open Heart Surgery;  Laterality: N/A;   TONSILLECTOMY     TRANSCATHETER AORTIC VALVE REPLACEMENT, TRANSFEMORAL N/A 01/03/2017   Procedure: TRANSCATHETER AORTIC VALVE REPLACEMENT, TRANSFEMORAL;  Surgeon: Burnell Blanks, MD;  Location: [place];  Service: Open Heart Surgery;  Laterality: N/A;   TUMOR REMOVAL      Current Medications: No outpatient medications have been marked as taking for the 02/24/21 encounter (Office Visit) with Deberah Pelton, NP.     Allergies:   Other, Bactrim [sulfamethoxazole-trimethoprim], Amitriptyline hcl, Aspirin, Tape, and Zetia [ezetimibe]   Social History   Socioeconomic History   Marital status: Widowed    Spouse name: Not on file   Number of children: 3   Years of education: Not on file   Highest education level: Not on file  Occupational History    Employer: RETIRED  Tobacco Use   Smoking status: Never   Smokeless tobacco: Former    Types: Snuff    Quit date: 05/23/2001   Tobacco comments:    Former Snuff (02/04/2021)  Vaping Use   Vaping Use: Never used  Substance and Sexual Activity   Alcohol use: No   Drug use: No   Sexual activity: Not Currently  Other Topics Concern   Not on file  Social History Narrative   Not on file   Social Determinants of Health   Financial Resource Strain: Not on file  Food Insecurity: Not on file  Transportation Needs: Not on file  Physical Activity: Not on file  Stress: Not on file  Social Connections: Not on file     Family History: The patient's family history includes Heart failure in her mother; Other in her father.  ROS:   Please see the history of present illness.     All  other systems reviewed and are negative.   Risk Assessment/Calculations:           Physical Exam:    VS:  BP 117/63   Pulse 70   Ht 5\' 1"  (1.549 m)   Wt 89 lb (40.4 kg)   SpO2 99%   BMI 16.82 kg/m     Wt Readings from Last 3 Encounters:  02/24/21 89 lb (40.4 kg)  02/18/21 87 lb 6.4 oz (39.6 kg)  02/10/21 90 lb (40.8 kg)     GEN:  Well nourished, well developed in no acute distress HEENT: Normal NECK: No JVD; No carotid bruits LYMPHATICS: No lymphadenopathy CARDIAC: V-paced rhythm, no murmurs, rubs, gallops RESPIRATORY:  Clear to auscultation without rales, wheezing or rhonchi  ABDOMEN: Soft, non-tender, non-distended MUSCULOSKELETAL:  No edema; No deformity  SKIN: Warm and dry NEUROLOGIC:  Alert and  oriented x 3 PSYCHIATRIC:  Normal affect    EKGs/Labs/Other Studies Reviewed:    The following studies were reviewed today:  Echocardiogram 01/15/2021 Sonographer Comments: Externally paced at 30 bpm. Limited Apical and  Subcostal views due to external pacing.  IMPRESSIONS     1. Left ventricular ejection fraction, by estimation, is 55 to 60%. The  left ventricle has normal function. There is mild left ventricular  hypertrophy.   2. Right ventricular systolic function is mildly reduced. The right  ventricular size is normal.   3. The mitral valve is degenerative. Mild to moderate mitral valve  regurgitation. Severe mitral annular calcification.   4. Tricuspid valve regurgitation is mild to moderate.   5. Unable to assess TAVR valve hemodynamically due to limited acoustic  windows for imaging. Valve is grossly normal in appearance with trivial  central prosthetic AI. No rocking or dehiscence.. The aortic valve has  been repaired/replaced. Aortic valve  regurgitation is trivial. There is a 23 mm Sapien prosthetic (TAVR) valve  present in the aortic position. Procedure Date: 01/03/2017.   EKG:  EKG is  ordered today.  The ekg ordered today demonstrates V-paced  rhythm 70bpm  Recent Labs: 01/14/2021: TSH 2.243 01/18/2021: ALT <5; BUN 10; Creatinine, Ser 0.57; Hemoglobin 12.1; Magnesium 1.8; Platelets 185; Potassium 3.7; Sodium 136  Recent Lipid Panel    Component Value Date/Time   CHOL 151 11/13/2019 1529   TRIG 84 11/13/2019 1529   HDL 77 11/13/2019 1529   CHOLHDL 2.0 11/13/2019 1529   CHOLHDL 2.0 08/21/2015 0001   VLDL 17 08/21/2015 0001   LDLCALC 58 11/13/2019 1529    ASSESSMENT & PLAN    Sick sinus syndrome/atrial fibrillation-no further episodes of syncope.  Underwent dual-chamber pacemaker implantation on 01/15/2021.  Follow-up echo showed normal LV function.  Reports compliance with apixaban and denies bleeding issues. Continue apixaban, metoprolol Heart healthy low-sodium diet.   Increase physical activity as tolerated  Coronary artery disease-denies episodes of arm neck back or chest discomfort.  Status post CABG. Continue nitroglycerin, rosuvastatin, metoprolol Heart healthy low-sodium diet-salty 6 given Increase physical activity as tolerated  Essential hypertension-BP today 117/63.  Well-controlled at home. Continue metoprolol Heart healthy low-sodium diet-salty 6 given Increase physical activity as tolerated  Malnutrition-noted to have severe protein malnutrition and deconditioning during hospitalization.  Started on PPI per GI during admission.  Her symptoms improved with eating small meals. Continues to eat frequent small meals at home. Follows with PCP  Preoperative cardiac evaluation-[facility]; Dr. Benson Norway     Primary Cardiologist: Lauree Chandler, MD  Chart reviewed as part of pre-operative protocol coverage. Given past medical history and time since last visit, based on ACC/AHA guidelines, SUZETTE FLAGLER would be at acceptable risk for the planned procedure without further cardiovascular testing.   Patient with diagnosis of afib on Eliquis for anticoagulation.     Procedure: EGD Date of  procedure: 03/19/21   CHA2DS2-VASc Score = 7   This indicates a 11.2% annual risk of stroke. The patient's score is based upon: CHF History: 0 HTN History: 1 Diabetes History: 0 Stroke History: 2 Vascular Disease History: 1 Age Score: 2 Gender Score: 1       CrCl 29.8 ml/min (rounding scr up to 0.8 and using actual body weight due to being under weight)   Per office protocol, patient can hold Eliquis for 1 day prior to procedure.    Patient was advised that if she develops new symptoms prior to surgery to contact  our office to arrange a follow-up appointment.  She verbalized understanding.  I will route this recommendation to the requesting party via Epic fax function and remove from pre-op pool.  Please call with questions.    Disposition: Follow-up with Dr. Angelena Form or APP in 3-4 months.        Medication Adjustments/Labs and Tests Ordered: Current medicines are reviewed at length with the patient today.  Concerns regarding medicines are outlined above.  No orders of the defined types were placed in this encounter.  No orders of the defined types were placed in this encounter.   There are no Patient Instructions on file for this visit.   Signed, Deberah Pelton, NP  02/24/2021 2:12 PM      Notice: This dictation was prepared with Dragon dictation along with smaller phrase technology. Any transcriptional errors that result from this process are unintentional and may not be corrected upon review.  I spent 14 minutes examining this patient, reviewing medications, and using patient centered shared decision making involving her cardiac care.  Prior to her visit I spent greater than 20 minutes reviewing her past medical history,  medications, and prior cardiac tests.

## 2021-02-24 ENCOUNTER — Encounter (HOSPITAL_BASED_OUTPATIENT_CLINIC_OR_DEPARTMENT_OTHER): Payer: Self-pay | Admitting: General Practice

## 2021-02-24 ENCOUNTER — Ambulatory Visit (INDEPENDENT_AMBULATORY_CARE_PROVIDER_SITE_OTHER): Payer: Medicare Other | Admitting: General Practice

## 2021-02-24 ENCOUNTER — Other Ambulatory Visit: Payer: Self-pay

## 2021-02-24 VITALS — BP 117/63 | HR 70 | Ht 61.0 in | Wt 89.0 lb

## 2021-02-24 DIAGNOSIS — Z01810 Encounter for preprocedural cardiovascular examination: Secondary | ICD-10-CM | POA: Diagnosis not present

## 2021-02-24 DIAGNOSIS — I25119 Atherosclerotic heart disease of native coronary artery with unspecified angina pectoris: Secondary | ICD-10-CM | POA: Diagnosis not present

## 2021-02-24 DIAGNOSIS — I495 Sick sinus syndrome: Secondary | ICD-10-CM | POA: Diagnosis not present

## 2021-02-24 DIAGNOSIS — I10 Essential (primary) hypertension: Secondary | ICD-10-CM

## 2021-02-24 DIAGNOSIS — I480 Paroxysmal atrial fibrillation: Secondary | ICD-10-CM | POA: Diagnosis not present

## 2021-02-24 DIAGNOSIS — E43 Unspecified severe protein-calorie malnutrition: Secondary | ICD-10-CM | POA: Diagnosis not present

## 2021-02-24 NOTE — Patient Instructions (Signed)
Medication Instructions:  Your physician recommends that you continue on your current medications as directed. Please refer to the Current Medication list given to you today.  OK to hold Eliquis 1 day prior to EGD.   *If you need a refill on your cardiac medications before your next appointment, please call your pharmacy*  Follow-Up: At [facility], you and your health needs are our priority.  As part of our continuing mission to provide you with exceptional heart care, we have created designated Provider Care Teams.  These Care Teams include your primary Cardiologist (physician) and Advanced Practice Providers (APPs -  Physician Assistants and Nurse Practitioners) who all work together to provide you with the care you need, when you need it.  We recommend signing up for the patient portal called "MyChart".  Sign up information is provided on this After Visit Summary.  MyChart is used to connect with patients for Virtual Visits (Telemedicine).  Patients are able to view lab/test results, encounter notes, upcoming appointments, etc.  Non-urgent messages can be sent to your provider as well.   To learn more about what you can do with MyChart, go to [email or web address].    Your next appointment:   3-4 month(s)  The format for your next appointment:   In Person  Provider:   You may see Lauree Chandler, MD or one of the following Advanced Practice Providers on your designated Care Team:   Melina Copa, PA-C Ermalinda Barrios, PA-C   Other Instructions Coletta Memos, NP with send his note to the Provider doing your EGD to let them know you have been cleared.

## 2021-02-26 DIAGNOSIS — E43 Unspecified severe protein-calorie malnutrition: Secondary | ICD-10-CM | POA: Diagnosis not present

## 2021-02-26 DIAGNOSIS — I4891 Unspecified atrial fibrillation: Secondary | ICD-10-CM | POA: Diagnosis not present

## 2021-02-26 DIAGNOSIS — R627 Adult failure to thrive: Secondary | ICD-10-CM | POA: Diagnosis not present

## 2021-02-26 DIAGNOSIS — J45909 Unspecified asthma, uncomplicated: Secondary | ICD-10-CM | POA: Diagnosis not present

## 2021-02-26 DIAGNOSIS — Z681 Body mass index (BMI) 19 or less, adult: Secondary | ICD-10-CM | POA: Diagnosis not present

## 2021-02-26 DIAGNOSIS — I2510 Atherosclerotic heart disease of native coronary artery without angina pectoris: Secondary | ICD-10-CM | POA: Diagnosis not present

## 2021-02-27 DIAGNOSIS — I4891 Unspecified atrial fibrillation: Secondary | ICD-10-CM | POA: Diagnosis not present

## 2021-02-27 DIAGNOSIS — E43 Unspecified severe protein-calorie malnutrition: Secondary | ICD-10-CM | POA: Diagnosis not present

## 2021-02-27 DIAGNOSIS — J45909 Unspecified asthma, uncomplicated: Secondary | ICD-10-CM | POA: Diagnosis not present

## 2021-02-27 DIAGNOSIS — Z681 Body mass index (BMI) 19 or less, adult: Secondary | ICD-10-CM | POA: Diagnosis not present

## 2021-02-27 DIAGNOSIS — I2510 Atherosclerotic heart disease of native coronary artery without angina pectoris: Secondary | ICD-10-CM | POA: Diagnosis not present

## 2021-02-27 DIAGNOSIS — R627 Adult failure to thrive: Secondary | ICD-10-CM | POA: Diagnosis not present

## 2021-03-02 DIAGNOSIS — Z681 Body mass index (BMI) 19 or less, adult: Secondary | ICD-10-CM | POA: Diagnosis not present

## 2021-03-02 DIAGNOSIS — R627 Adult failure to thrive: Secondary | ICD-10-CM | POA: Diagnosis not present

## 2021-03-02 DIAGNOSIS — J45909 Unspecified asthma, uncomplicated: Secondary | ICD-10-CM | POA: Diagnosis not present

## 2021-03-02 DIAGNOSIS — I2510 Atherosclerotic heart disease of native coronary artery without angina pectoris: Secondary | ICD-10-CM | POA: Diagnosis not present

## 2021-03-02 DIAGNOSIS — E43 Unspecified severe protein-calorie malnutrition: Secondary | ICD-10-CM | POA: Diagnosis not present

## 2021-03-02 DIAGNOSIS — I4891 Unspecified atrial fibrillation: Secondary | ICD-10-CM | POA: Diagnosis not present

## 2021-03-04 DIAGNOSIS — R627 Adult failure to thrive: Secondary | ICD-10-CM | POA: Diagnosis not present

## 2021-03-04 DIAGNOSIS — I2510 Atherosclerotic heart disease of native coronary artery without angina pectoris: Secondary | ICD-10-CM | POA: Diagnosis not present

## 2021-03-04 DIAGNOSIS — E43 Unspecified severe protein-calorie malnutrition: Secondary | ICD-10-CM | POA: Diagnosis not present

## 2021-03-04 DIAGNOSIS — J45909 Unspecified asthma, uncomplicated: Secondary | ICD-10-CM | POA: Diagnosis not present

## 2021-03-04 DIAGNOSIS — I4891 Unspecified atrial fibrillation: Secondary | ICD-10-CM | POA: Diagnosis not present

## 2021-03-04 DIAGNOSIS — Z681 Body mass index (BMI) 19 or less, adult: Secondary | ICD-10-CM | POA: Diagnosis not present

## 2021-03-06 DIAGNOSIS — Z681 Body mass index (BMI) 19 or less, adult: Secondary | ICD-10-CM | POA: Diagnosis not present

## 2021-03-06 DIAGNOSIS — I4891 Unspecified atrial fibrillation: Secondary | ICD-10-CM | POA: Diagnosis not present

## 2021-03-06 DIAGNOSIS — J45909 Unspecified asthma, uncomplicated: Secondary | ICD-10-CM | POA: Diagnosis not present

## 2021-03-06 DIAGNOSIS — E43 Unspecified severe protein-calorie malnutrition: Secondary | ICD-10-CM | POA: Diagnosis not present

## 2021-03-06 DIAGNOSIS — R627 Adult failure to thrive: Secondary | ICD-10-CM | POA: Diagnosis not present

## 2021-03-06 DIAGNOSIS — I2510 Atherosclerotic heart disease of native coronary artery without angina pectoris: Secondary | ICD-10-CM | POA: Diagnosis not present

## 2021-03-09 DIAGNOSIS — J45909 Unspecified asthma, uncomplicated: Secondary | ICD-10-CM | POA: Diagnosis not present

## 2021-03-09 DIAGNOSIS — Z681 Body mass index (BMI) 19 or less, adult: Secondary | ICD-10-CM | POA: Diagnosis not present

## 2021-03-09 DIAGNOSIS — R627 Adult failure to thrive: Secondary | ICD-10-CM | POA: Diagnosis not present

## 2021-03-09 DIAGNOSIS — E43 Unspecified severe protein-calorie malnutrition: Secondary | ICD-10-CM | POA: Diagnosis not present

## 2021-03-09 DIAGNOSIS — I4891 Unspecified atrial fibrillation: Secondary | ICD-10-CM | POA: Diagnosis not present

## 2021-03-09 DIAGNOSIS — I2510 Atherosclerotic heart disease of native coronary artery without angina pectoris: Secondary | ICD-10-CM | POA: Diagnosis not present

## 2021-03-10 ENCOUNTER — Encounter (HOSPITAL_COMMUNITY): Payer: Self-pay | Admitting: Gastroenterology

## 2021-03-11 ENCOUNTER — Telehealth: Payer: Self-pay

## 2021-03-11 DIAGNOSIS — Z681 Body mass index (BMI) 19 or less, adult: Secondary | ICD-10-CM | POA: Diagnosis not present

## 2021-03-11 DIAGNOSIS — E43 Unspecified severe protein-calorie malnutrition: Secondary | ICD-10-CM | POA: Diagnosis not present

## 2021-03-11 DIAGNOSIS — J45909 Unspecified asthma, uncomplicated: Secondary | ICD-10-CM | POA: Diagnosis not present

## 2021-03-11 DIAGNOSIS — I2510 Atherosclerotic heart disease of native coronary artery without angina pectoris: Secondary | ICD-10-CM | POA: Diagnosis not present

## 2021-03-11 DIAGNOSIS — R627 Adult failure to thrive: Secondary | ICD-10-CM | POA: Diagnosis not present

## 2021-03-11 DIAGNOSIS — I4891 Unspecified atrial fibrillation: Secondary | ICD-10-CM | POA: Diagnosis not present

## 2021-03-11 NOTE — Telephone Encounter (Signed)
Pt daughter called to find out when she need to have another covid booster.  Pt had bivalent  02/18/21 and natalie was advised pt will not need another for at least six months for that date. [place]

## 2021-03-17 DIAGNOSIS — I2510 Atherosclerotic heart disease of native coronary artery without angina pectoris: Secondary | ICD-10-CM | POA: Diagnosis not present

## 2021-03-17 DIAGNOSIS — K219 Gastro-esophageal reflux disease without esophagitis: Secondary | ICD-10-CM | POA: Diagnosis not present

## 2021-03-17 DIAGNOSIS — F419 Anxiety disorder, unspecified: Secondary | ICD-10-CM | POA: Diagnosis not present

## 2021-03-17 DIAGNOSIS — Z7901 Long term (current) use of anticoagulants: Secondary | ICD-10-CM | POA: Diagnosis not present

## 2021-03-17 DIAGNOSIS — J45909 Unspecified asthma, uncomplicated: Secondary | ICD-10-CM | POA: Diagnosis not present

## 2021-03-17 DIAGNOSIS — E43 Unspecified severe protein-calorie malnutrition: Secondary | ICD-10-CM | POA: Diagnosis not present

## 2021-03-17 DIAGNOSIS — E785 Hyperlipidemia, unspecified: Secondary | ICD-10-CM | POA: Diagnosis not present

## 2021-03-17 DIAGNOSIS — I4891 Unspecified atrial fibrillation: Secondary | ICD-10-CM | POA: Diagnosis not present

## 2021-03-17 DIAGNOSIS — Z8673 Personal history of transient ischemic attack (TIA), and cerebral infarction without residual deficits: Secondary | ICD-10-CM | POA: Diagnosis not present

## 2021-03-17 DIAGNOSIS — I447 Left bundle-branch block, unspecified: Secondary | ICD-10-CM | POA: Diagnosis not present

## 2021-03-17 DIAGNOSIS — I441 Atrioventricular block, second degree: Secondary | ICD-10-CM | POA: Diagnosis not present

## 2021-03-17 DIAGNOSIS — Z7951 Long term (current) use of inhaled steroids: Secondary | ICD-10-CM | POA: Diagnosis not present

## 2021-03-17 DIAGNOSIS — M1990 Unspecified osteoarthritis, unspecified site: Secondary | ICD-10-CM | POA: Diagnosis not present

## 2021-03-17 DIAGNOSIS — Z952 Presence of prosthetic heart valve: Secondary | ICD-10-CM | POA: Diagnosis not present

## 2021-03-17 DIAGNOSIS — I10 Essential (primary) hypertension: Secondary | ICD-10-CM | POA: Diagnosis not present

## 2021-03-17 DIAGNOSIS — Z955 Presence of coronary angioplasty implant and graft: Secondary | ICD-10-CM | POA: Diagnosis not present

## 2021-03-17 DIAGNOSIS — Z950 Presence of cardiac pacemaker: Secondary | ICD-10-CM | POA: Diagnosis not present

## 2021-03-17 DIAGNOSIS — R627 Adult failure to thrive: Secondary | ICD-10-CM | POA: Diagnosis not present

## 2021-03-17 DIAGNOSIS — Z951 Presence of aortocoronary bypass graft: Secondary | ICD-10-CM | POA: Diagnosis not present

## 2021-03-17 DIAGNOSIS — Z681 Body mass index (BMI) 19 or less, adult: Secondary | ICD-10-CM | POA: Diagnosis not present

## 2021-03-19 ENCOUNTER — Ambulatory Visit (HOSPITAL_COMMUNITY)
Admission: RE | Admit: 2021-03-19 | Discharge: 2021-03-19 | Disposition: A | Discharge status: Home / Self Care | Payer: Medicare Other | Source: Ambulatory Visit | Attending: Gastroenterology | Admitting: Gastroenterology

## 2021-03-19 ENCOUNTER — Encounter (HOSPITAL_COMMUNITY)
Admission: RE | Disposition: A | Discharge status: Home / Self Care | Payer: Self-pay | Source: Ambulatory Visit | Attending: Gastroenterology

## 2021-03-19 ENCOUNTER — Other Ambulatory Visit: Payer: Self-pay

## 2021-03-19 ENCOUNTER — Ambulatory Visit (HOSPITAL_COMMUNITY): Payer: Medicare Other | Admitting: Anesthesiology

## 2021-03-19 DIAGNOSIS — R634 Abnormal weight loss: Secondary | ICD-10-CM | POA: Diagnosis not present

## 2021-03-19 DIAGNOSIS — Z951 Presence of aortocoronary bypass graft: Secondary | ICD-10-CM | POA: Diagnosis not present

## 2021-03-19 DIAGNOSIS — R6881 Early satiety: Secondary | ICD-10-CM | POA: Diagnosis not present

## 2021-03-19 DIAGNOSIS — Z888 Allergy status to other drugs, medicaments and biological substances status: Secondary | ICD-10-CM | POA: Diagnosis not present

## 2021-03-19 DIAGNOSIS — Z886 Allergy status to analgesic agent status: Secondary | ICD-10-CM | POA: Insufficient documentation

## 2021-03-19 DIAGNOSIS — Z955 Presence of coronary angioplasty implant and graft: Secondary | ICD-10-CM | POA: Insufficient documentation

## 2021-03-19 DIAGNOSIS — Z881 Allergy status to other antibiotic agents status: Secondary | ICD-10-CM | POA: Insufficient documentation

## 2021-03-19 DIAGNOSIS — Z681 Body mass index (BMI) 19 or less, adult: Secondary | ICD-10-CM | POA: Insufficient documentation

## 2021-03-19 DIAGNOSIS — R1310 Dysphagia, unspecified: Secondary | ICD-10-CM | POA: Insufficient documentation

## 2021-03-19 DIAGNOSIS — Z950 Presence of cardiac pacemaker: Secondary | ICD-10-CM | POA: Insufficient documentation

## 2021-03-19 DIAGNOSIS — E785 Hyperlipidemia, unspecified: Secondary | ICD-10-CM | POA: Diagnosis not present

## 2021-03-19 DIAGNOSIS — Z91048 Other nonmedicinal substance allergy status: Secondary | ICD-10-CM | POA: Insufficient documentation

## 2021-03-19 DIAGNOSIS — Z85828 Personal history of other malignant neoplasm of skin: Secondary | ICD-10-CM | POA: Diagnosis not present

## 2021-03-19 DIAGNOSIS — I480 Paroxysmal atrial fibrillation: Secondary | ICD-10-CM | POA: Diagnosis not present

## 2021-03-19 DIAGNOSIS — K219 Gastro-esophageal reflux disease without esophagitis: Secondary | ICD-10-CM | POA: Diagnosis not present

## 2021-03-19 DIAGNOSIS — Z952 Presence of prosthetic heart valve: Secondary | ICD-10-CM | POA: Diagnosis not present

## 2021-03-19 DIAGNOSIS — Z8673 Personal history of transient ischemic attack (TIA), and cerebral infarction without residual deficits: Secondary | ICD-10-CM | POA: Diagnosis not present

## 2021-03-19 HISTORY — PX: SAVORY DILATION: SHX5439

## 2021-03-19 HISTORY — PX: ESOPHAGOGASTRODUODENOSCOPY (EGD) WITH PROPOFOL: SHX5813

## 2021-03-19 SURGERY — ESOPHAGOGASTRODUODENOSCOPY (EGD) WITH PROPOFOL
Anesthesia: General

## 2021-03-19 MED ORDER — PROPOFOL 10 MG/ML IV BOLUS
INTRAVENOUS | Status: DC | PRN
  Administered 2021-03-19: 300 ug/kg/min via INTRAVENOUS

## 2021-03-19 MED ORDER — SODIUM CHLORIDE 0.9 % IV SOLN: INTRAVENOUS | Status: DC

## 2021-03-19 MED ORDER — LACTATED RINGERS IV SOLN: INTRAVENOUS | Status: DC | PRN

## 2021-03-19 SURGICAL SUPPLY — 15 items

## 2021-03-19 NOTE — Anesthesia Procedure Notes (Signed)
Procedure Name: MAC Date/Time: 03/19/2021 10:55 AM Performed by: Lissa Morales, CRNA Pre-anesthesia Checklist: Patient identified, Emergency Drugs available, Suction available and Patient being monitored Patient Re-evaluated:Patient Re-evaluated prior to induction Oxygen Delivery Method: Simple face mask Preoxygenation: Pre-oxygenation with 100% oxygen Placement Confirmation: positive ETCO2

## 2021-03-19 NOTE — Op Note (Signed)
[facility] Patient Name: [name] Procedure Date: 03/19/2021 MRN: [id number] Attending MD: Carol Ada , MD Date of Birth: 26-Apr-1929 CSN: [id number] Age: 87 Admit Type: Outpatient Procedure:                Upper GI endoscopy Indications:              Dysphagia Providers:                Carol Ada, MD, Jaci Carrel, RN, Tyna Jaksch Technician Referring MD:              Medicines:                 Complications:            No immediate complications. Estimated Blood Loss:     Estimated blood loss: none. Estimated blood loss:                            none. Procedure:                Pre-Anesthesia Assessment:                           - Prior to the procedure, a History and Physical                            was performed, and patient medications and                            allergies were reviewed. The patient's tolerance of                            previous anesthesia was also reviewed. The risks                            and benefits of the procedure and the sedation                            options and risks were discussed with the patient.                            All questions were answered, and informed consent                            was obtained. Prior Anticoagulants: The patient has                            taken Eliquis (apixaban), last dose was 2 days                            prior to procedure. ASA Grade Assessment: III - A                            patient with severe systemic disease.  After                            reviewing the risks and benefits, the patient was                            deemed in satisfactory condition to undergo the                            procedure.                           - Sedation was administered by an anesthesia                            professional. Deep sedation was attained.                           After obtaining informed consent, the endoscope was                             passed under direct vision. Throughout the                            procedure, the patient's blood pressure, pulse, and                            oxygen saturations were monitored continuously. The                            GIF-H190 ([id number]) Olympus endoscope was introduced                            through the mouth, and advanced to the second part                            of duodenum. The upper GI endoscopy was                            accomplished without difficulty. The patient                            tolerated the procedure well. Scope In: Scope Out: Findings:      No endoscopic abnormality was evident in the esophagus to explain the       patient's complaint of dysphagia. It was decided, however, to proceed       with dilation of the entire esophagus. A guidewire was placed and the       scope was withdrawn. Dilation was performed with a Savary dilator with       no resistance at 18 mm. The dilation site was examined following       endoscope reinsertion and showed no change.      The stomach was normal.      The examined duodenum was normal.      The reported isolated gastric varices were not identified during this  examination. Gastritis was no found. There was no obvious evidence of       portal HTN gastropathy or esophageal varices with the examination. Impression:               - No endoscopic esophageal abnormality to explain                            patient's dysphagia. Esophagus dilated. Dilated.                           - Normal stomach.                           - Normal examined duodenum.                           - No specimens collected. Moderate Sedation:      Not Applicable - Patient had care per Anesthesia. Recommendation:           - Patient has a contact number available for                            emergencies. The signs and symptoms of potential                            delayed complications were discussed with  the                            patient. Return to normal activities tomorrow.                            Written discharge instructions were provided to the                            patient.                           - Resume previous diet.                           - Continue present medications.                           - Return to GI clinic in 4 weeks with Dr. Collene Mares.                           - Resume Eliquis. Procedure Code(s):        --- Professional ---                           [phone], Esophagogastroduodenoscopy, flexible,                            transoral; with insertion of guide wire followed by                            passage of dilator(s) through esophagus  over guide                            wire Diagnosis Code(s):        --- Professional ---                           R13.10, Dysphagia, unspecified CPT copyright 2019 American Medical Association. All rights reserved. The codes documented in this report are preliminary and upon coder review may  be revised to meet current compliance requirements. Carol Ada, MD Carol Ada, MD 03/19/2021 11:16:46 AM This report has been signed electronically. Number of Addenda: 0

## 2021-03-19 NOTE — Discharge Instructions (Signed)

## 2021-03-19 NOTE — Anesthesia Preprocedure Evaluation (Addendum)
Anesthesia Evaluation  Patient identified by MRN, date of birth, ID band Patient awake    Reviewed: Allergy & Precautions, NPO status , Patient's Chart, lab work & pertinent test results  Airway Mallampati: II  TM Distance: >3 FB Neck ROM: Full    Dental  (+) Edentulous Upper, Edentulous Lower, Dental Advisory Given   Pulmonary asthma , pneumonia,    breath sounds clear to auscultation       Cardiovascular hypertension, Pt. on medications and Pt. on home beta blockers + angina + CAD, + Past MI, + CABG and + Peripheral Vascular Disease  + dysrhythmias Atrial Fibrillation + pacemaker + Valvular Problems/Murmurs AS and MR  Rhythm:Regular + Systolic murmurs Echo 12/4164 1. Left ventricular ejection fraction, by estimation, is 55 to 60%. The left ventricle has normal function. There is mild left ventricular hypertrophy.  2. Right ventricular systolic function is mildly reduced. The right ventricular size is normal.  3. The mitral valve is degenerative. Mild to moderate mitral valve regurgitation. Severe mitral annular calcification.  4. Tricuspid valve regurgitation is mild to moderate.  5. Unable to assess TAVR valve hemodynamically due to limited acoustic windows for imaging. Valve is grossly normal in appearance with trivial central prosthetic AI. No rocking or dehiscence. The aortic valve has been repaired/replaced. Aortic valve regurgitation is trivial. There is a 23 mm Sapien prosthetic (TAVR) valve present in the aortic position. Procedure Date: 01/03/2017.   Pacemaker implant 12/2020 For symptomatic bradycardia, <1% AP, 93% VP   Neuro/Psych PSYCHIATRIC DISORDERS Anxiety CVA    GI/Hepatic Neg liver ROS, hiatal hernia, GERD  ,  Endo/Other  negative endocrine ROS  Renal/GU negative Renal ROS     Musculoskeletal  (+) Arthritis , Fibromyalgia -  Abdominal (+) - obese,   Peds  Hematology  (+) anemia ,   Anesthesia Other  Findings   Reproductive/Obstetrics                          Anesthesia Physical Anesthesia Plan  ASA: 4  Anesthesia Plan: MAC   Post-op Pain Management:    Induction: Intravenous  PONV Risk Score and Plan: 2 and Propofol infusion, TIVA and Treatment may vary due to age or medical condition  Airway Management Planned: Natural Airway  Additional Equipment:   Intra-op Plan:   Post-operative Plan:   Informed Consent: I have reviewed the patients History and Physical, chart, labs and discussed the procedure including the risks, benefits and alternatives for the proposed anesthesia with the patient or authorized representative who has indicated his/her understanding and acceptance.     Dental advisory given  Plan Discussed with: CRNA  Anesthesia Plan Comments: (Took nitro last night. Does not know how often she is taking in. )      Anesthesia Quick Evaluation

## 2021-03-19 NOTE — H&P (Signed)
[name] HPI: This 87 year old white female presents to the office for a hospital follow up. She is accompanied by her daughter [name]. She was in [facility] 8/25 - 01/19/2021 for syncope, bradycardia and shortness of breath. She had a pacemaker placed on 01/15/2021 in the cardiac rehabilitation center at [facility] at the present time. She has an RUQ abdominal ultrasound done on 01/17/2021 that revealed cholelithiasis, mildly increased hepatic echogenicity consistent with hepatic steatosis.  She had a CT scan of the abdomen pelvis done on 01/16/2021 that revealed multiple choledocholithiasis within the dilated common bile duct and early mucosal hyperenhancement of the gastric fold suggestive of underlying varices versus gastritis along with diverticulosis. She continues to have difficulty swallowing solids and liquids.  She was diagnosed with a presbyesophagus and small hiatal hernia in 2013 when a barium swallow was done. She takes Protonix 40 mg BID. She has problems with chronic constipation and can go 2-3 days without a BM. There is no obvious blood or mucus in the stool. She has deceased appetite and she has lost 20 pounds since her last visit here at our office. She weighed 145 pounds in 2012.  Her daughter is very concerned about her abnormal weight loss and early satiety as she cannot eat more than 1 or 2 bites of food at a time. She denies having any complaints of abdominal pain, nausea, vomiting, dysphagia or odynophagia. She denies having a family history of colon cancer, celiac sprue or IBD.  Last colonoscopy was in 2009 when internal and external hemorrhoids were noted along with a few early sigmoid diverticula and hyperplastic polyps were removed from the cecum.  Past Medical History:  Diagnosis Date   Anxiety    Aortic Stenosis s/p TAVR    Echo 10/21: EF 55-60, no RWMA, mild asymmetric LVH, normal RVSF, RVSP 40.2 mmHg, mild MR, s/p TAVR with mean gradient 8.39mmHg, no PVL   Arthritis     OSTEO   Aspirin allergy    on Plavix   Asthma    Bronchitis    CAD (coronary artery disease)    a. s/p CABG 2006 with Cox-Maze procedure.   Carotid artery disease ([place])    a. s/p R CEA.   Chronic stable angina (HCC)    Diastolic dysfunction    Eczema    Fibromyalgia    GERD (gastroesophageal reflux disease)    Heart murmur    Hiatal hernia    Hyperlipidemia    Hypertension    Kyphoscoliosis    Mitral regurgitation    Myocardial infarction [facility]) 2003   Stent to CFX   PAF (paroxysmal atrial fibrillation) ([place])    a. pt has h/o hematuria on Eliquis and has since refused anticoagulation   Pneumonia 2015 ?   Pulmonary regurgitation    Skin cancer of arm    Stroke [facility])    Tricuspid regurgitation     Past Surgical History:  Procedure Laterality Date   ABDOMINAL HYSTERECTOMY  1976   CAROTID ENDARTERECTOMY  2010   CORONARY ANGIOPLASTY WITH STENT PLACEMENT     CORONARY ARTERY BYPASS GRAFT  01/2005   LIMA-D1; SVG-LAD; SVG-OM; SVG-PDA   EYE SURGERY     MULTIPLE EXTRACTIONS WITH ALVEOLOPLASTY  12/28/2016   Extraction of tooth #'s 2- 5,7-10, 73,53,29-92,EQA 22-29 with alveoloplasty and maxillary right and left lateral exostoses reductions   MULTIPLE EXTRACTIONS WITH ALVEOLOPLASTY N/A 12/28/2016   Procedure: Extraction of tooth #'s 2- 5,7-10, 83,41,96-22,WLN 22-29 with alveoloplasty and maxillary right and  left lateral exostoses reductions;  Surgeon: Lenn Cal, DDS;  Location: [place];  Service: Oral Surgery;  Laterality: N/A;   PACEMAKER IMPLANT N/A 01/15/2021   Procedure: PACEMAKER IMPLANT;  Surgeon: Constance Haw, MD;  Location: [place] [facility];  Service: Cardiovascular;  Laterality: N/A;   RIGHT/LEFT HEART CATH AND CORONARY/GRAFT ANGIOGRAPHY N/A 10/27/2016   Procedure: Right/Left Heart Cath and Coronary/Graft Angiography;  Surgeon: Burnell Blanks, MD;  Location: [place] [facility];  Service: Cardiovascular;  Laterality: N/A;   TEE WITHOUT CARDIOVERSION N/A  01/03/2017   Procedure: TRANSESOPHAGEAL ECHOCARDIOGRAM (TEE);  Surgeon: Burnell Blanks, MD;  Location: [place];  Service: Open Heart Surgery;  Laterality: N/A;   TONSILLECTOMY     TRANSCATHETER AORTIC VALVE REPLACEMENT, TRANSFEMORAL N/A 01/03/2017   Procedure: TRANSCATHETER AORTIC VALVE REPLACEMENT, TRANSFEMORAL;  Surgeon: Burnell Blanks, MD;  Location: [place];  Service: Open Heart Surgery;  Laterality: N/A;   TUMOR REMOVAL      Family History  Problem Relation Age of Onset   Heart failure Mother    Other Father     Social History:  reports that she has never smoked. She quit smokeless tobacco use about 19 years ago.  Her smokeless tobacco use included snuff. She reports that she does not drink alcohol and does not use drugs.  Allergies:  Allergies  Allergen Reactions   Other Other (See Comments)    NO ACIDIC, TART< OR SPICY FOODS- DEVELOPS REFLUX OFTEN!!  PATIENT HAS TROUBLE SWALLOWING TABLETS!!   Bactrim [Sulfamethoxazole-Trimethoprim] Other (See Comments)    "makes her feel funny" or "unsteady"    Amitriptyline Hcl Rash   Aspirin Hives, Swelling, Rash and Other (See Comments)    Body became swollen   Tape Other (See Comments)    SKIN IS VERY THIN- WILL TEAR EASILY!!   Zetia [Ezetimibe] Rash    Medications: Scheduled: Continuous:  sodium chloride      No results found for this or any previous visit (from the past 24 hour(s)).   No results found.  ROS:  As stated above in the HPI otherwise negative.  Blood pressure (!) 157/68, pulse 69, temperature 98 F (36.7 C), temperature source Oral, resp. rate 12, height 5\' 1"  (1.549 m), weight 39.9 kg, SpO2 99 %.    PE: Gen: NAD, Alert and Oriented HEENT:  [place]/AT, EOMI Neck: Supple, no LAD Lungs: CTA Bilaterally CV: RRR without M/G/R ABD: Soft, NTND, +BS Ext: No C/C/E  Assessment/Plan: 1) Dysphagia - EGD with possible dilation.  Muaaz Brau D 03/19/2021, 10:53 AM

## 2021-03-19 NOTE — Transfer of Care (Signed)
Immediate Anesthesia Transfer of Care Note  Patient: [name]  Procedure(s) Performed: ESOPHAGOGASTRODUODENOSCOPY (EGD) WITH PROPOFOL SAVORY DILATION  Patient Location: PACU  Anesthesia Type:MAC  Level of Consciousness: awake and patient cooperative  Airway & Oxygen Therapy: Patient Spontanous Breathing and Patient connected to face mask oxygen  Post-op Assessment: Report given to RN, Post -op Vital signs reviewed and stable and Patient moving all extremities X 4  Post vital signs: stable  Last Vitals:  Vitals Value Taken Time  BP 145/45 03/19/21 1120  Temp    Pulse 70 03/19/21 1121  Resp 13 03/19/21 1121  SpO2 89 % 03/19/21 1121  Vitals shown include unvalidated device data.  Last Pain:  Vitals:   03/19/21 1022  TempSrc: Oral  PainSc: 0-No pain         Complications: No notable events documented.

## 2021-03-21 NOTE — Anesthesia Postprocedure Evaluation (Signed)
Anesthesia Post Note  Patient: [name]  Procedure(s) Performed: ESOPHAGOGASTRODUODENOSCOPY (EGD) WITH PROPOFOL SAVORY DILATION     Patient location during evaluation: Endoscopy Anesthesia Type: General Level of consciousness: awake and alert Pain management: pain level controlled Vital Signs Assessment: post-procedure vital signs reviewed and stable Respiratory status: spontaneous breathing Cardiovascular status: stable Anesthetic complications: no   No notable events documented.  Last Vitals:  Vitals:   03/19/21 1140 03/19/21 1151  BP: (!) 199/75 (!) 185/69  Pulse: 70 70  Resp: 18 (!) 24  Temp:    SpO2: 97% 98%    Last Pain:  Vitals:   03/20/21 1404  TempSrc:   PainSc: 0-No pain                 Nolon Nations

## 2021-03-22 ENCOUNTER — Encounter (HOSPITAL_COMMUNITY): Payer: Self-pay | Admitting: Gastroenterology

## 2021-03-23 DIAGNOSIS — E43 Unspecified severe protein-calorie malnutrition: Secondary | ICD-10-CM | POA: Diagnosis not present

## 2021-03-23 DIAGNOSIS — I2510 Atherosclerotic heart disease of native coronary artery without angina pectoris: Secondary | ICD-10-CM | POA: Diagnosis not present

## 2021-03-23 DIAGNOSIS — I4891 Unspecified atrial fibrillation: Secondary | ICD-10-CM | POA: Diagnosis not present

## 2021-03-23 DIAGNOSIS — Z681 Body mass index (BMI) 19 or less, adult: Secondary | ICD-10-CM | POA: Diagnosis not present

## 2021-03-23 DIAGNOSIS — J45909 Unspecified asthma, uncomplicated: Secondary | ICD-10-CM | POA: Diagnosis not present

## 2021-03-23 DIAGNOSIS — R627 Adult failure to thrive: Secondary | ICD-10-CM | POA: Diagnosis not present

## 2021-03-31 DIAGNOSIS — R627 Adult failure to thrive: Secondary | ICD-10-CM | POA: Diagnosis not present

## 2021-03-31 DIAGNOSIS — I2510 Atherosclerotic heart disease of native coronary artery without angina pectoris: Secondary | ICD-10-CM | POA: Diagnosis not present

## 2021-03-31 DIAGNOSIS — E43 Unspecified severe protein-calorie malnutrition: Secondary | ICD-10-CM | POA: Diagnosis not present

## 2021-03-31 DIAGNOSIS — I4891 Unspecified atrial fibrillation: Secondary | ICD-10-CM | POA: Diagnosis not present

## 2021-03-31 DIAGNOSIS — Z681 Body mass index (BMI) 19 or less, adult: Secondary | ICD-10-CM | POA: Diagnosis not present

## 2021-03-31 DIAGNOSIS — J45909 Unspecified asthma, uncomplicated: Secondary | ICD-10-CM | POA: Diagnosis not present

## 2021-04-06 DIAGNOSIS — I4891 Unspecified atrial fibrillation: Secondary | ICD-10-CM | POA: Diagnosis not present

## 2021-04-06 DIAGNOSIS — Z681 Body mass index (BMI) 19 or less, adult: Secondary | ICD-10-CM | POA: Diagnosis not present

## 2021-04-06 DIAGNOSIS — E43 Unspecified severe protein-calorie malnutrition: Secondary | ICD-10-CM | POA: Diagnosis not present

## 2021-04-06 DIAGNOSIS — I2510 Atherosclerotic heart disease of native coronary artery without angina pectoris: Secondary | ICD-10-CM | POA: Diagnosis not present

## 2021-04-06 DIAGNOSIS — R627 Adult failure to thrive: Secondary | ICD-10-CM | POA: Diagnosis not present

## 2021-04-06 DIAGNOSIS — J45909 Unspecified asthma, uncomplicated: Secondary | ICD-10-CM | POA: Diagnosis not present

## 2021-04-09 ENCOUNTER — Ambulatory Visit (INDEPENDENT_AMBULATORY_CARE_PROVIDER_SITE_OTHER): Payer: Medicare Other

## 2021-04-09 VITALS — BP 117/55 | HR 70 | Temp 98.3°F | Ht 60.0 in | Wt 89.0 lb

## 2021-04-09 DIAGNOSIS — Z0000 Encounter for general adult medical examination without abnormal findings: Secondary | ICD-10-CM | POA: Diagnosis not present

## 2021-04-09 NOTE — Patient Instructions (Signed)
[name] , Thank you for taking time to come for your Medicare Wellness Visit. I appreciate your ongoing commitment to your health goals. Please review the following plan we discussed and let me know if I can assist you in the future.   Screening recommendations/referrals: Colonoscopy: not required Mammogram: not required Bone Density: not required Recommended yearly ophthalmology/optometry visit for glaucoma screening and checkup Recommended yearly dental visit for hygiene and checkup  Vaccinations: Influenza vaccine: completed 02/18/2021 Pneumococcal vaccine: completed 06/14/2013 Tdap vaccine: completed 11/09/2018, due 11/08/2028 Shingles vaccine: completed   Covid-19: 02/18/2021, 10/11/2019, 09/20/2019  Advanced directives: Please bring a copy of your POA (Power of Attorney) and/or Living Will to your next appointment.   Conditions/risks identified: none  Next appointment: Follow up in one year for your annual wellness visit    Preventive Care 65 Years and Older, Female Preventive care refers to lifestyle choices and visits with your health care provider that can promote health and wellness. What does preventive care include? A yearly physical exam. This is also called an annual well check. Dental exams once or twice a year. Routine eye exams. Ask your health care provider how often you should have your eyes checked. Personal lifestyle choices, including: Daily care of your teeth and gums. Regular physical activity. Eating a healthy diet. Avoiding tobacco and drug use. Limiting alcohol use. Practicing safe sex. Taking low-dose aspirin every day. Taking vitamin and mineral supplements as recommended by your health care provider. What happens during an annual well check? The services and screenings done by your health care provider during your annual well check will depend on your age, overall health, lifestyle risk factors, and family history of disease. Counseling  Your health  care provider may ask you questions about your: Alcohol use. Tobacco use. Drug use. Emotional well-being. Home and relationship well-being. Sexual activity. Eating habits. History of falls. Memory and ability to understand (cognition). Work and work Statistician. Reproductive health. Screening  You may have the following tests or measurements: Height, weight, and BMI. Blood pressure. Lipid and cholesterol levels. These may be checked every 5 years, or more frequently if you are over 73 years old. Skin check. Lung cancer screening. You may have this screening every year starting at age 52 if you have a 30-pack-year history of smoking and currently smoke or have quit within the past 15 years. Fecal occult blood test (FOBT) of the stool. You may have this test every year starting at age 5. Flexible sigmoidoscopy or colonoscopy. You may have a sigmoidoscopy every 5 years or a colonoscopy every 10 years starting at age 64. Hepatitis C blood test. Hepatitis B blood test. Sexually transmitted disease (STD) testing. Diabetes screening. This is done by checking your blood sugar (glucose) after you have not eaten for a while (fasting). You may have this done every 1-3 years. Bone density scan. This is done to screen for osteoporosis. You may have this done starting at age 55. Mammogram. This may be done every 1-2 years. Talk to your health care provider about how often you should have regular mammograms. Talk with your health care provider about your test results, treatment options, and if necessary, the need for more tests. Vaccines  Your health care provider may recommend certain vaccines, such as: Influenza vaccine. This is recommended every year. Tetanus, diphtheria, and acellular pertussis (Tdap, Td) vaccine. You may need a Td booster every 10 years. Zoster vaccine. You may need this after age 26. Pneumococcal 13-valent conjugate (PCV13) vaccine. One dose  is recommended after age  65. Pneumococcal polysaccharide (PPSV23) vaccine. One dose is recommended after age 7. Talk to your health care provider about which screenings and vaccines you need and how often you need them. This information is not intended to replace advice given to you by your health care provider. Make sure you discuss any questions you have with your health care provider. Document Released: 06/05/2015 Document Revised: 01/27/2016 Document Reviewed: 03/10/2015 Elsevier Interactive Patient Education  2017 [place] Prevention in the Home Falls can cause injuries. They can happen to people of all ages. There are many things you can do to make your home safe and to help prevent falls. What can I do on the outside of my home? Regularly fix the edges of walkways and driveways and fix any cracks. Remove anything that might make you trip as you walk through a door, such as a raised step or threshold. Trim any bushes or trees on the path to your home. Use bright outdoor lighting. Clear any walking paths of anything that might make someone trip, such as rocks or tools. Regularly check to see if handrails are loose or broken. Make sure that both sides of any steps have handrails. Any raised decks and porches should have guardrails on the edges. Have any leaves, snow, or ice cleared regularly. Use sand or salt on walking paths during winter. Clean up any spills in your garage right away. This includes oil or grease spills. What can I do in the bathroom? Use night lights. Install grab bars by the toilet and in the tub and shower. Do not use towel bars as grab bars. Use non-skid mats or decals in the tub or shower. If you need to sit down in the shower, use a plastic, non-slip stool. Keep the floor dry. Clean up any water that spills on the floor as soon as it happens. Remove soap buildup in the tub or shower regularly. Attach bath mats securely with double-sided non-slip rug tape. Do not have throw  rugs and other things on the floor that can make you trip. What can I do in the bedroom? Use night lights. Make sure that you have a light by your bed that is easy to reach. Do not use any sheets or blankets that are too big for your bed. They should not hang down onto the floor. Have a firm chair that has side arms. You can use this for support while you get dressed. Do not have throw rugs and other things on the floor that can make you trip. What can I do in the kitchen? Clean up any spills right away. Avoid walking on wet floors. Keep items that you use a lot in easy-to-reach places. If you need to reach something above you, use a strong step stool that has a grab bar. Keep electrical cords out of the way. Do not use floor polish or wax that makes floors slippery. If you must use wax, use non-skid floor wax. Do not have throw rugs and other things on the floor that can make you trip. What can I do with my stairs? Do not leave any items on the stairs. Make sure that there are handrails on both sides of the stairs and use them. Fix handrails that are broken or loose. Make sure that handrails are as long as the stairways. Check any carpeting to make sure that it is firmly attached to the stairs. Fix any carpet that is loose or worn. Avoid  having throw rugs at the top or bottom of the stairs. If you do have throw rugs, attach them to the floor with carpet tape. Make sure that you have a light switch at the top of the stairs and the bottom of the stairs. If you do not have them, ask someone to add them for you. What else can I do to help prevent falls? Wear shoes that: Do not have high heels. Have rubber bottoms. Are comfortable and fit you well. Are closed at the toe. Do not wear sandals. If you use a stepladder: Make sure that it is fully opened. Do not climb a closed stepladder. Make sure that both sides of the stepladder are locked into place. Ask someone to hold it for you, if  possible. Clearly mark and make sure that you can see: Any grab bars or handrails. First and last steps. Where the edge of each step is. Use tools that help you move around (mobility aids) if they are needed. These include: Canes. Walkers. Scooters. Crutches. Turn on the lights when you go into a dark area. Replace any light bulbs as soon as they burn out. Set up your furniture so you have a clear path. Avoid moving your furniture around. If any of your floors are uneven, fix them. If there are any pets around you, be aware of where they are. Review your medicines with your doctor. Some medicines can make you feel dizzy. This can increase your chance of falling. Ask your doctor what other things that you can do to help prevent falls. This information is not intended to replace advice given to you by your health care provider. Make sure you discuss any questions you have with your health care provider. Document Released: 03/05/2009 Document Revised: 10/15/2015 Document Reviewed: 06/13/2014 Elsevier Interactive Patient Education  2017 [employer].

## 2021-04-09 NOTE — Progress Notes (Signed)
I connected with [name] today by telephone and verified that I am speaking with the correct person using two identifiers. Location patient: home Location provider: work Persons participating in the virtual visit: Sairah Knobloch, Mahogany Torrance (daughter), Glenna Durand LPN.   I discussed the limitations, risks, security and privacy concerns of performing an evaluation and management service by telephone and the availability of in person appointments. I also discussed with the patient that there may be a patient responsible charge related to this service. The patient expressed understanding and verbally consented to this telephonic visit.    Interactive audio and video telecommunications were attempted between this provider and patient, however failed, due to patient having technical difficulties OR patient did not have access to video capability.  We continued and completed visit with audio only.     Vital signs may be patient reported or missing.  Subjective:   [name] is a 87 y.o. female who presents for Medicare Annual (Subsequent) preventive examination.  Review of Systems     Cardiac Risk Factors include: advanced age (>62men, >55 women);dyslipidemia     Objective:    Today's Vitals   04/09/21 1542 04/09/21 1544  BP: (!) 117/55   Pulse: 70   Temp: 98.3 F (36.8 C)   Weight: 89 lb (40.4 kg)   Height: 5' (1.524 m)   PainSc:  5    Body mass index is 17.38 kg/m.  Advanced Directives 04/09/2021 03/19/2021 02/08/2021 01/14/2021 01/13/2021 11/13/2019 11/02/2018  Does Patient Have a Medical Advance Directive? Yes No No No No No No  Type of Paramedic of [place];Living will - - - - - -  Copy of [place] in Chart? Yes - validated most recent copy scanned in chart (See row information) - - - - - -  Would patient like information on creating a medical advance directive? - No - Patient declined;No - Guardian declined No - Patient  declined No - Patient declined - No - Patient declined Yes (MAU/Ambulatory/Procedural Areas - Information given)  Pre-existing out of facility DNR order (yellow form or pink MOST form) - - - - - - -    Current Medications (verified) Outpatient Encounter Medications as of 04/09/2021  Medication Sig   acetaminophen (TYLENOL) 650 MG CR tablet Take 1,300 mg by mouth 2 (two) times daily as needed for pain.    apixaban (ELIQUIS) 2.5 MG TABS tablet TAKE 1 TABLET(2.5 MG) BY MOUTH TWICE DAILY   Cholecalciferol (VITAMIN D-3) 25 MCG (1000 UT) CAPS Take 1,000 Units by mouth daily.   fluticasone (FLONASE) 50 MCG/ACT nasal spray SHAKE LIQUID AND USE 2 SPRAYS IN EACH NOSTRIL EVERY DAY   fluticasone (FLOVENT HFA) 220 MCG/ACT inhaler Inhale 2 puffs into the lungs 2 (two) times daily.   guaiFENesin (MUCINEX) 600 MG 12 hr tablet Take 1 tablet (600 mg total) by mouth 2 (two) times daily.   LINZESS 72 MCG capsule Take 1 capsule (72 mcg total) by mouth daily.   loratadine (CLARITIN) 10 MG tablet Take 1 tablet (10 mg total) by mouth daily.   nitroGLYCERIN (NITROSTAT) 0.4 MG SL tablet Take 1 tablet sublingually every 5 minutes as needed for chest pain, up to 3 doses   pantoprazole (PROTONIX) 40 MG tablet Take 1 tablet (40 mg total) by mouth 2 (two) times daily.   rosuvastatin (CRESTOR) 40 MG tablet Take 1 tablet (40 mg total) by mouth daily.   senna-docusate (SENNA PLUS) 8.6-50 MG tablet Take 2 tablets  by mouth daily.   VENTOLIN HFA 108 (90 Base) MCG/ACT inhaler Administer 2 puffs into the lungs every 6 hours as needed for wheezing and shortness of breath   Wheat Dextrin (BENEFIBER PO) Take 1 Scoop by mouth daily as needed (fiber).   Ensure (ENSURE) Take 237 mLs by mouth daily. (Patient not taking: Reported on 04/09/2021)   polyethylene glycol powder (MIRALAX) 17 GM/SCOOP powder Take 17 g by mouth 2 (two) times daily as needed for moderate constipation. (Patient not taking: Reported on 03/12/2021)   No  facility-administered encounter medications on file as of 04/09/2021.    Allergies (verified) Other, Bactrim [sulfamethoxazole-trimethoprim], Amitriptyline hcl, Aspirin, Tape, and Zetia [ezetimibe]   History: Past Medical History:  Diagnosis Date   Anxiety    Aortic Stenosis s/p TAVR    Echo 10/21: EF 55-60, no RWMA, mild asymmetric LVH, normal RVSF, RVSP 40.2 mmHg, mild MR, s/p TAVR with mean gradient 8.5mmHg, no PVL   Arthritis    OSTEO   Aspirin allergy    on Plavix   Asthma    Bronchitis    CAD (coronary artery disease)    a. s/p CABG 2006 with Cox-Maze procedure.   Carotid artery disease ([place])    a. s/p R CEA.   Chronic stable angina (HCC)    Diastolic dysfunction    Eczema    Fibromyalgia    GERD (gastroesophageal reflux disease)    Heart murmur    Hiatal hernia    Hyperlipidemia    Hypertension    Kyphoscoliosis    Mitral regurgitation    Myocardial infarction [facility]) 2003   Stent to CFX   PAF (paroxysmal atrial fibrillation) ([place])    a. pt has h/o hematuria on Eliquis and has since refused anticoagulation   Pneumonia 2015 ?   Pulmonary regurgitation    Skin cancer of arm    Stroke [facility])    Tricuspid regurgitation    Past Surgical History:  Procedure Laterality Date   ABDOMINAL HYSTERECTOMY  1976   CAROTID ENDARTERECTOMY  2010   CORONARY ANGIOPLASTY WITH STENT PLACEMENT     CORONARY ARTERY BYPASS GRAFT  01/2005   LIMA-D1; SVG-LAD; SVG-OM; SVG-PDA   ESOPHAGOGASTRODUODENOSCOPY (EGD) WITH PROPOFOL N/A 03/19/2021   Procedure: ESOPHAGOGASTRODUODENOSCOPY (EGD) WITH PROPOFOL;  Surgeon: Carol Ada, MD;  Location: [facility];  Service: Endoscopy;  Laterality: N/A;   EYE SURGERY     MULTIPLE EXTRACTIONS WITH ALVEOLOPLASTY  12/28/2016   Extraction of tooth #'s 2- 5,7-10, 12,13,17-20,and 22-29 with alveoloplasty and maxillary right and left lateral exostoses reductions   MULTIPLE EXTRACTIONS WITH ALVEOLOPLASTY N/A 12/28/2016   Procedure: Extraction of tooth #'s 2-  5,7-10, 12,13,17-20,and 22-29 with alveoloplasty and maxillary right and left lateral exostoses reductions;  Surgeon: Lenn Cal, DDS;  Location: [place];  Service: Oral Surgery;  Laterality: N/A;   PACEMAKER IMPLANT N/A 01/15/2021   Procedure: PACEMAKER IMPLANT;  Surgeon: Constance Haw, MD;  Location: [place] [facility];  Service: Cardiovascular;  Laterality: N/A;   RIGHT/LEFT HEART CATH AND CORONARY/GRAFT ANGIOGRAPHY N/A 10/27/2016   Procedure: Right/Left Heart Cath and Coronary/Graft Angiography;  Surgeon: Burnell Blanks, MD;  Location: [place] [facility];  Service: Cardiovascular;  Laterality: N/A;   SAVORY DILATION N/A 03/19/2021   Procedure: SAVORY DILATION;  Surgeon: Carol Ada, MD;  Location[facility];  Service: Endoscopy;  Laterality: N/A;   TEE WITHOUT CARDIOVERSION N/A 01/03/2017   Procedure: TRANSESOPHAGEAL ECHOCARDIOGRAM (TEE);  Surgeon: Burnell Blanks, MD;  Location: [place];  Service: Open Heart Surgery;  Laterality: N/A;   TONSILLECTOMY     TRANSCATHETER AORTIC VALVE REPLACEMENT, TRANSFEMORAL N/A 01/03/2017   Procedure: TRANSCATHETER AORTIC VALVE REPLACEMENT, TRANSFEMORAL;  Surgeon: Burnell Blanks, MD;  Location: [place];  Service: Open Heart Surgery;  Laterality: N/A;   TUMOR REMOVAL     Family History  Problem Relation Age of Onset   Heart failure Mother    Other Father    Social History   Socioeconomic History   Marital status: Widowed    Spouse name: Not on file   Number of children: 3   Years of education: Not on file   Highest education level: Not on file  Occupational History    Employer: RETIRED  Tobacco Use   Smoking status: Never   Smokeless tobacco: Former    Types: Snuff    Quit date: 05/23/2001   Tobacco comments:    Former Snuff (02/04/2021)  Vaping Use   Vaping Use: Never used  Substance and Sexual Activity   Alcohol use: No   Drug use: No   Sexual activity: Not Currently  Other Topics Concern   Not on file   Social History Narrative   Not on file   Social Determinants of Health   Financial Resource Strain: Low Risk    Difficulty of Paying Living Expenses: Not hard at all  Food Insecurity: No Food Insecurity   Worried About Charity fundraiser in the Last Year: Never true   [place] in the Last Year: Never true  Transportation Needs: No Transportation Needs   Lack of Transportation (Medical): No   Lack of Transportation (Non-Medical): No  Physical Activity: Insufficiently Active   Days of Exercise per Week: 2 days   Minutes of Exercise per Session: 10 min  Stress: No Stress Concern Present   Feeling of Stress : Not at all  Social Connections: Not on file    Tobacco Counseling Counseling given: Not Answered Tobacco comments: Former Snuff (02/04/2021)   Clinical Intake:  Pre-visit preparation completed: Yes  Pain : 0-10 Pain Score: 5  Pain Type: Chronic pain Pain Location: Finger (Comment which one) Pain Orientation: Right, Left Pain Descriptors / Indicators: Aching Pain Onset: More than a month ago Pain Frequency: Constant     Nutritional Status: BMI <19  Underweight Nutritional Risks: None Diabetes: No  How often do you need to have someone help you when you read instructions, pamphlets, or other written materials from your doctor or pharmacy?: 1 - Never  Diabetic? no  Interpreter Needed?: No  Information entered by :: NAllen LPN   Activities of Daily Living In your present state of health, do you have any difficulty performing the following activities: 04/09/2021 01/17/2021  Hearing? Tempie Donning  Vision? N Y  Difficulty concentrating or making decisions? N N  Walking or climbing stairs? Y Y  Dressing or bathing? N Y  Doing errands, shopping? [place] and eating ? Y -  Using the Toilet? N -  In the past six months, have you accidently leaked urine? N -  Do you have problems with loss of bowel control? N -  Managing your  Medications? Y -  Managing your Finances? Y -  Housekeeping or managing your Housekeeping? Y -  Some recent data might be hidden    Patient Care Team: Denita Lung, MD as PCP - General (Family Medicine) Burnell Blanks, MD as PCP - Cardiology (Cardiology)  Constance Haw, MD as PCP - Electrophysiology (Cardiology)  Indicate any recent Medical Services you may have received from other than [facility] providers in the past year (date may be approximate).     Assessment:   This is a routine wellness examination for [facility].  Hearing/Vision screen Vision Screening - Comments:: No regular eye exams, Dr. Gershon Crane  Dietary issues and exercise activities discussed: Current Exercise Habits: Home exercise routine, Type of exercise: stretching, Time (Minutes): 10, Frequency (Times/Week): 2, Weekly Exercise (Minutes/Week): 20   Goals Addressed             This Visit's Progress    Patient Stated       04/09/2021, wants to be able to walk       Depression Screen PHQ 2/9 Scores 04/09/2021 02/18/2021 11/13/2019 11/02/2018 03/21/2018 03/27/2017 08/21/2015  PHQ - 2 Score 0 0 0 0 0 0 0    Fall Risk Fall Risk  04/09/2021 01/06/2021 11/13/2019 11/02/2018 03/21/2018  Falls in the past year? 1 0 1 0 No  Comment slid out of wheelchair - - - -  Number falls in past yr: 0 0 0 - -  Comment - - - - -  Injury with Fall? 0 0 0 - -  Risk for fall due to : Impaired balance/gait;Impaired mobility;Medication side effect No Fall Risks - - -  Follow up Falls evaluation completed;Education provided;Falls prevention discussed Falls evaluation completed - - -    FALL RISK PREVENTION PERTAINING TO THE HOME:  Any stairs in or around the home? Yes  If so, are there any without handrails? No  Home free of loose throw rugs in walkways, pet beds, electrical cords, etc? Yes  Adequate lighting in your home to reduce risk of falls? Yes   ASSISTIVE DEVICES UTILIZED TO PREVENT FALLS:  Life alert? Yes  Use  of a cane, walker or w/c? Yes  Grab bars in the bathroom? Yes  Shower chair or bench in shower? No  Elevated toilet seat or a handicapped toilet? No   TIMED UP AND GO:  Was the test performed? No .      Cognitive Function:     6CIT Screen 04/09/2021  What Year? 0 points  What month? 0 points  What time? 0 points  Count back from 20 0 points  Months in reverse 0 points  Repeat phrase 0 points  Total Score 0    Immunizations Immunization History  Administered Date(s) Administered   Fluad Quad(high Dose 65+) 02/18/2021   Influenza Split 03/28/2012, 04/28/2015   Influenza Whole 02/23/2009   Influenza, High Dose Seasonal PF 03/14/2013, 03/03/2017, 03/16/2018, 02/18/2019   Influenza-Unspecified 03/16/2018   Moderna Covid-19 Vaccine Bivalent Booster 84yrs & up 02/18/2021   PFIZER(Purple Top)SARS-COV-2 Vaccination 09/20/2019, 10/11/2019   Pneumococcal Conjugate-13 06/14/2013   Pneumococcal Polysaccharide-23 12/22/2008   Tdap 10/11/2006, 07/14/2017, 11/09/2018   Zoster Recombinat (Shingrix) 09/10/2017, 12/12/2017   Zoster, Live 01/01/2009    TDAP status: Up to date  Flu Vaccine status: Up to date  Pneumococcal vaccine status: Up to date  Covid-19 vaccine status: Completed vaccines  Qualifies for Shingles Vaccine? Yes   Zostavax completed Yes   Shingrix Completed?: Yes  Screening Tests Health Maintenance  Topic Date Due   COVID-19 Vaccine (3 - Pfizer risk series) 02/18/2021   TETANUS/TDAP  11/08/2028   Pneumonia Vaccine 37+ Years old  Completed   INFLUENZA VACCINE  Completed   Zoster Vaccines- Shingrix  Completed   HPV VACCINES  Aged Out  DEXA SCAN  Discontinued    Health Maintenance  Health Maintenance Due  Topic Date Due   COVID-19 Vaccine (3 - Pfizer risk series) 02/18/2021    Colorectal cancer screening: No longer required.   Mammogram status: No longer required due to age.  Bone Density status: n/a  Lung Cancer Screening: (Low Dose CT Chest  recommended if Age 65-80 years, 30 pack-year currently smoking OR have quit w/in 15years.) does not qualify.   Lung Cancer Screening Referral: no  Additional Screening:  Hepatitis C Screening: does not qualify;   Vision Screening: Recommended annual ophthalmology exams for early detection of glaucoma and other disorders of the eye. Is the patient up to date with their annual eye exam?  No  Who is the provider or what is the name of the office in which the patient attends annual eye exams? Dr. Gershon Crane If pt is not established with a provider, would they like to be referred to a provider to establish care? No .   Dental Screening: Recommended annual dental exams for proper oral hygiene  Community Resource Referral / Chronic Care Management: CRR required this visit?  No   CCM required this visit?  No      Plan:     I have personally reviewed and noted the following in the patient's chart:   Medical and social history Use of alcohol, tobacco or illicit drugs  Current medications and supplements including opioid prescriptions.  Functional ability and status Nutritional status Physical activity Advanced directives List of other physicians Hospitalizations, surgeries, and ER visits in previous 12 months Vitals Screenings to include cognitive, depression, and falls Referrals and appointments  In addition, I have reviewed and discussed with patient certain preventive protocols, quality metrics, and best practice recommendations. A written personalized care plan for preventive services as well as general preventive health recommendations were provided to patient.     Kellie Simmering, LPN   [id number]   Nurse Notes: none

## 2021-04-10 ENCOUNTER — Other Ambulatory Visit: Payer: Self-pay | Admitting: Family Medicine

## 2021-04-10 DIAGNOSIS — Z8719 Personal history of other diseases of the digestive system: Secondary | ICD-10-CM

## 2021-04-13 DIAGNOSIS — I2510 Atherosclerotic heart disease of native coronary artery without angina pectoris: Secondary | ICD-10-CM | POA: Diagnosis not present

## 2021-04-13 DIAGNOSIS — I4891 Unspecified atrial fibrillation: Secondary | ICD-10-CM | POA: Diagnosis not present

## 2021-04-13 DIAGNOSIS — R627 Adult failure to thrive: Secondary | ICD-10-CM | POA: Diagnosis not present

## 2021-04-13 DIAGNOSIS — Z681 Body mass index (BMI) 19 or less, adult: Secondary | ICD-10-CM | POA: Diagnosis not present

## 2021-04-13 DIAGNOSIS — J45909 Unspecified asthma, uncomplicated: Secondary | ICD-10-CM | POA: Diagnosis not present

## 2021-04-13 DIAGNOSIS — E43 Unspecified severe protein-calorie malnutrition: Secondary | ICD-10-CM | POA: Diagnosis not present

## 2021-04-20 ENCOUNTER — Ambulatory Visit (INDEPENDENT_AMBULATORY_CARE_PROVIDER_SITE_OTHER): Payer: Medicare Other

## 2021-04-20 ENCOUNTER — Encounter: Payer: Self-pay | Admitting: Cardiology

## 2021-04-20 ENCOUNTER — Ambulatory Visit (INDEPENDENT_AMBULATORY_CARE_PROVIDER_SITE_OTHER): Payer: Medicare Other | Admitting: Cardiology

## 2021-04-20 ENCOUNTER — Other Ambulatory Visit: Payer: Self-pay

## 2021-04-20 DIAGNOSIS — I495 Sick sinus syndrome: Secondary | ICD-10-CM

## 2021-04-20 DIAGNOSIS — I25119 Atherosclerotic heart disease of native coronary artery with unspecified angina pectoris: Secondary | ICD-10-CM

## 2021-04-20 LAB — CUP PACEART REMOTE DEVICE CHECK
Battery Remaining Longevity: 70 mo
Battery Remaining Percentage: 95.5 %
Battery Voltage: 2.99 V
Brady Statistic AP VP Percent: 14 %
Brady Statistic AP VS Percent: 1 %
Brady Statistic AS VP Percent: 86 %
Brady Statistic AS VS Percent: 1 %
Brady Statistic RA Percent Paced: 1 %
Brady Statistic RV Percent Paced: 96 %
Date Time Interrogation Session: 20221129020014
Implantable Lead Implant Date: 20220826
Implantable Lead Implant Date: 20220826
Implantable Lead Location: 753859
Implantable Lead Location: 753860
Implantable Pulse Generator Implant Date: 20220826
Lead Channel Impedance Value: 440 Ohm
Lead Channel Impedance Value: 540 Ohm
Lead Channel Pacing Threshold Amplitude: 0.5 V
Lead Channel Pacing Threshold Pulse Width: 0.5 ms
Lead Channel Sensing Intrinsic Amplitude: 12 mV
Lead Channel Sensing Intrinsic Amplitude: 4.3 mV
Lead Channel Setting Pacing Amplitude: 3.5 V
Lead Channel Setting Pacing Amplitude: 3.5 V
Lead Channel Setting Pacing Pulse Width: 0.5 ms
Lead Channel Setting Sensing Sensitivity: 2 mV
Pulse Gen Model: 2272
Pulse Gen Serial Number: 3953466

## 2021-04-20 LAB — CUP PACEART INCLINIC DEVICE CHECK
Battery Remaining Longevity: 110 mo
Battery Voltage: 2.99 V
Brady Statistic RA Percent Paced: 0.95 %
Brady Statistic RV Percent Paced: 96 %
Date Time Interrogation Session: 20221129144719
Implantable Lead Implant Date: 20220826
Implantable Lead Implant Date: 20220826
Implantable Lead Location: 753859
Implantable Lead Location: 753860
Implantable Pulse Generator Implant Date: 20220826
Lead Channel Impedance Value: 462.5 Ohm
Lead Channel Impedance Value: 550 Ohm
Lead Channel Pacing Threshold Amplitude: 0.75 V
Lead Channel Pacing Threshold Pulse Width: 0.5 ms
Lead Channel Sensing Intrinsic Amplitude: 12 mV
Lead Channel Sensing Intrinsic Amplitude: 2.1 mV
Lead Channel Setting Pacing Amplitude: 2 V
Lead Channel Setting Pacing Amplitude: 2.5 V
Lead Channel Setting Pacing Pulse Width: 0.5 ms
Lead Channel Setting Sensing Sensitivity: 2 mV
Pulse Gen Model: 2272
Pulse Gen Serial Number: 3953466

## 2021-04-20 NOTE — Progress Notes (Signed)
Electrophysiology Office Note   Date:  04/20/2021   ID:  [name], [name] [birth year], MRN [id number]  PCP:  Denita Lung, MD  Cardiologist:  Angelena Form Primary Electrophysiologist: Anasophia Pecor Meredith Leeds, MD    Chief Complaint  Patient presents with   Follow-up      History of Present Illness: [name] is a 87 y.o. female who is being seen today for the evaluation of atrial fibrillation at the request of Denita Lung, MD. Presenting today for electrophysiology evaluation.   She has a history significant for coronary artery disease status post CABG with maze in 2006, atrial fibrillation, prior stroke, PVD, aortic stenosis status post TAVR 2018, hypertension, hyperlipidemia.  She presented to the hospital with episodes of syncope.  She was noted to have significant pauses.  She is now status post [place] dual-chamber pacemaker implanted 01/15/2021.  Today, denies symptoms of palpitations, chest pain, shortness of breath, orthopnea, PND, lower extremity edema, claudication, dizziness, presyncope, syncope, bleeding, or neurologic sequela. The patient is tolerating medications without difficulties.  Since her pacemaker was implanted she has done well.  She has had no chest pain or shortness of breath.  Is able to do all of her daily activities without restriction.  She is overall happy with her care.   Past Medical History:  Diagnosis Date   Anxiety    Aortic Stenosis s/p TAVR    Echo 10/21: EF 55-60, no RWMA, mild asymmetric LVH, normal RVSF, RVSP 40.2 mmHg, mild MR, s/p TAVR with mean gradient 8.106mmHg, no PVL   Arthritis    OSTEO   Aspirin allergy    on Plavix   Asthma    Bronchitis    CAD (coronary artery disease)    a. s/p CABG 2006 with Cox-Maze procedure.   Carotid artery disease ([place])    a. s/p R CEA.   Chronic stable angina (HCC)    Diastolic dysfunction    Eczema    Fibromyalgia    GERD (gastroesophageal reflux disease)    Heart murmur    Hiatal hernia     Hyperlipidemia    Hypertension    Kyphoscoliosis    Mitral regurgitation    Myocardial infarction [facility]) 2003   Stent to CFX   PAF (paroxysmal atrial fibrillation) ([place])    a. pt has h/o hematuria on Eliquis and has since refused anticoagulation   Pneumonia 2015 ?   Pulmonary regurgitation    Skin cancer of arm    Stroke [facility])    Tricuspid regurgitation    Past Surgical History:  Procedure Laterality Date   ABDOMINAL HYSTERECTOMY  1976   CAROTID ENDARTERECTOMY  2010   CORONARY ANGIOPLASTY WITH STENT PLACEMENT     CORONARY ARTERY BYPASS GRAFT  01/2005   LIMA-D1; SVG-LAD; SVG-OM; SVG-PDA   ESOPHAGOGASTRODUODENOSCOPY (EGD) WITH PROPOFOL N/A 03/19/2021   Procedure: ESOPHAGOGASTRODUODENOSCOPY (EGD) WITH PROPOFOL;  Surgeon: Carol Ada, MD;  [facility];  Service: Endoscopy;  Laterality: N/A;   EYE SURGERY     MULTIPLE EXTRACTIONS WITH ALVEOLOPLASTY  12/28/2016   Extraction of tooth #'s 2- 5,7-10, 12,13,17-20,and 22-29 with alveoloplasty and maxillary right and left lateral exostoses reductions   MULTIPLE EXTRACTIONS WITH ALVEOLOPLASTY N/A 12/28/2016   Procedure: Extraction of tooth #'s 2- 5,7-10, 12,13,17-20,and 22-29 with alveoloplasty and maxillary right and left lateral exostoses reductions;  Surgeon: Lenn Cal, DDS;  Location: [place];  Service: Oral Surgery;  Laterality: N/A;   PACEMAKER IMPLANT N/A 01/15/2021   Procedure: PACEMAKER  IMPLANT;  Surgeon: Constance Haw, MD;  Location: [place] CV LAB;  Service: Cardiovascular;  Laterality: N/A;   RIGHT/LEFT HEART CATH AND CORONARY/GRAFT ANGIOGRAPHY N/A 10/27/2016   Procedure: Right/Left Heart Cath and Coronary/Graft Angiography;  Surgeon: Burnell Blanks, MD;  Location: [place] CV LAB;  Service: Cardiovascular;  Laterality: N/A;   SAVORY DILATION N/A 03/19/2021   Procedure: SAVORY DILATION;  Surgeon: Carol Ada, MD;  [facility];  Service: Endoscopy;  Laterality: N/A;   TEE WITHOUT  CARDIOVERSION N/A 01/03/2017   Procedure: TRANSESOPHAGEAL ECHOCARDIOGRAM (TEE);  Surgeon: Burnell Blanks, MD;  Location: [place];  Service: Open Heart Surgery;  Laterality: N/A;   TONSILLECTOMY     TRANSCATHETER AORTIC VALVE REPLACEMENT, TRANSFEMORAL N/A 01/03/2017   Procedure: TRANSCATHETER AORTIC VALVE REPLACEMENT, TRANSFEMORAL;  Surgeon: Burnell Blanks, MD;  Location: [place];  Service: Open Heart Surgery;  Laterality: N/A;   TUMOR REMOVAL       Current Outpatient Medications  Medication Sig Dispense Refill   acetaminophen (TYLENOL) 650 MG CR tablet Take 1,300 mg by mouth 2 (two) times daily as needed for pain.      apixaban (ELIQUIS) 2.5 MG TABS tablet TAKE 1 TABLET(2.5 MG) BY MOUTH TWICE DAILY 60 tablet 8   Cholecalciferol (VITAMIN D-3) 25 MCG (1000 UT) CAPS Take 1,000 Units by mouth daily.     Ensure (ENSURE) Take 237 mLs by mouth daily.     fluticasone (FLONASE) 50 MCG/ACT nasal spray SHAKE LIQUID AND USE 2 SPRAYS IN EACH NOSTRIL EVERY DAY 48 g 0   fluticasone (FLOVENT HFA) 220 MCG/ACT inhaler Inhale 2 puffs into the lungs 2 (two) times daily. 12 g 0   guaiFENesin (MUCINEX) 600 MG 12 hr tablet Take 1 tablet (600 mg total) by mouth 2 (two) times daily. 30 tablet 0   LINZESS 72 MCG capsule Take 1 capsule (72 mcg total) by mouth daily. 30 capsule 0   loratadine (CLARITIN) 10 MG tablet Take 1 tablet (10 mg total) by mouth daily. 30 tablet 0   nitroGLYCERIN (NITROSTAT) 0.4 MG SL tablet Take 1 tablet sublingually every 5 minutes as needed for chest pain, up to 3 doses 20 tablet 0   pantoprazole (PROTONIX) 40 MG tablet TAKE 1 TABLET BY MOUTH TWICE DAILY 180 tablet 0   polyethylene glycol powder (MIRALAX) 17 GM/SCOOP powder Take 17 g by mouth 2 (two) times daily as needed for moderate constipation. 255 g 0   rosuvastatin (CRESTOR) 40 MG tablet Take 1 tablet (40 mg total) by mouth daily. 30 tablet 0   VENTOLIN HFA 108 (90 Base) MCG/ACT inhaler Administer 2 puffs into the lungs every  6 hours as needed for wheezing and shortness of breath 18 g 0   Wheat Dextrin (BENEFIBER PO) Take 1 Scoop by mouth daily as needed (fiber).     No current facility-administered medications for this visit.    Allergies:   Other, Bactrim [sulfamethoxazole-trimethoprim], Amitriptyline hcl, Aspirin, Tape, and Zetia [ezetimibe]   Social History:  The patient  reports that she has never smoked. She quit smokeless tobacco use about 19 years ago.  Her smokeless tobacco use included snuff. She reports that she does not drink alcohol and does not use drugs.   Family History:  The patient's family history includes Heart failure in her mother; Other in her father.    ROS:  Please see the history of present illness.   Otherwise, review of systems is positive for none.   All other systems are  reviewed and negative.   PHYSICAL EXAM: VS:  BP (!) 100/58   Pulse 70   Ht 5\' 2"  (1.575 m)   Wt 88 lb (39.9 kg)   SpO2 96%   BMI 16.10 kg/m  , BMI Body mass index is 16.1 kg/m. GEN: Well nourished, well developed, in no acute distress  HEENT: normal  Neck: no JVD, carotid bruits, or masses Cardiac: RRR; no murmurs, rubs, or gallops,no edema  Respiratory:  clear to auscultation bilaterally, normal work of breathing GI: soft, nontender, nondistended, + BS MS: no deformity or atrophy  Skin: warm and dry, device site well healed Neuro:  Strength and sensation are intact Psych: euthymic mood, full affect  EKG:  EKG is ordered today. Personal review of the ekg ordered shows AF, V paced  Personal review of the device interrogation today. Results in [place]: 01/14/2021: TSH 2.243 01/18/2021: ALT <5; BUN 10; Creatinine, Ser 0.57; Hemoglobin 12.1; Magnesium 1.8; Platelets 185; Potassium 3.7; Sodium 136    Lipid Panel     Component Value Date/Time   CHOL 151 11/13/2019 1529   TRIG 84 11/13/2019 1529   HDL 77 11/13/2019 1529   CHOLHDL 2.0 11/13/2019 1529   CHOLHDL 2.0 08/21/2015 0001    VLDL 17 08/21/2015 0001   LDLCALC 58 11/13/2019 1529     Wt Readings from Last 3 Encounters:  04/20/21 88 lb (39.9 kg)  04/09/21 89 lb (40.4 kg)  03/19/21 88 lb (39.9 kg)      Other studies Reviewed: Additional studies/ records that were reviewed today include: TTE 02/06/17  Review of the above records today demonstrates:  - Left ventricle: The cavity size was normal. Systolic function was   normal. The estimated ejection fraction was in the range of 60%   to 65%. Wall motion was normal; there were no regional wall   motion abnormalities. The study was not technically sufficient to   allow evaluation of LV diastolic dysfunction due to atrial   fibrillation. - Aortic valve: A TAVR bioprosthetic valve is present (23 mm   Edwards-SAPIEN). There was no regurgitation. Mean gradient (S): 8   mm Hg. Peak gradient (S): 19 mm Hg. - Aortic root: The aortic root was normal in size. - Mitral valve: Calcified annulus. Moderately thickened, moderately   calcified leaflets . There was mild regurgitation. - Left atrium: The atrium was normal in size. - Right ventricle: Systolic function was normal. - Tricuspid valve: There was moderate regurgitation. - Pulmonic valve: There was mild regurgitation. - Pulmonary arteries: Systolic pressure was mildly increased. PA   peak pressure: 38 mm Hg (S). - Inferior vena cava: The vessel was normal in size. - Pericardium, extracardiac: There was no pericardial effusion.  Holter 02/15/17 - personally reviewed Sinus bradycardia with lowest heart rate 48 bpm Premature ventricular contractions Premature atrial contractions Transient second degree AV block.   ASSESSMENT AND PLAN:  1.  Paroxysmal atrial fibrillation: Currently on Eliquis 5 mg twice daily.  CHA2DS2-VASc of 8.  She was having significant pauses and atrial fibrillation with syncope.  Is now status post [place] dual-chamber pacemaker implanted 01/15/2021.  Device functioning appropriately.  No  changes at this time.  2.  Coronary artery disease status post CABG: Currently feeling well.  No current chest pain.  3.  Severe aortic stenosis: Status post TAVR.  Plan per primary cardiology.  Current medicines are reviewed at length with the patient today.   The patient does not have concerns regarding her  medicines.  The following changes were made today:  Diltiazem, start metoprolol  Labs/ tests ordered today include:  Orders Placed This Encounter  Procedures   EKG 12-Lead      Disposition:   FU with Chikita Dogan 3 months  Signed, Jessica Seidman Meredith Leeds, MD  04/20/2021 2:37 PM     [facility] [address] [place] [address] [phone] (office) [phone] (fax)

## 2021-04-20 NOTE — Patient Instructions (Addendum)
Medication Instructions:  Your physician recommends that you continue on your current medications as directed. Please refer to the Current Medication list given to you today.  *If you need a refill on your cardiac medications before your next appointment, please call your pharmacy*   Lab Work: None ordered`     Testing/Procedures: None ordered   Follow-Up: At [facility], you and your health needs are our priority.  As part of our continuing mission to provide you with exceptional heart care, we have created designated Provider Care Teams.  These Care Teams include your primary Cardiologist (physician) and Advanced Practice Providers (APPs -  Physician Assistants and Nurse Practitioners) who all work together to provide you with the care you need, when you need it.  We recommend signing up for the patient portal called "MyChart".  Sign up information is provided on this After Visit Summary.  MyChart is used to connect with patients for Virtual Visits (Telemedicine).  Patients are able to view lab/test results, encounter notes, upcoming appointments, etc.  Non-urgent messages can be sent to your provider as well.   To learn more about what you can do with MyChart, go to [email or web address].    Remote monitoring is used to monitor your Pacemaker or ICD from home. This monitoring reduces the number of office visits required to check your device to one time per year. It allows [place] to keep an eye on the functioning of your device to ensure it is working properly. You are scheduled for a device check from home on 07/20/2021. You may send your transmission at any time that day. If you have a wireless device, the transmission will be sent automatically. After your physician reviews your transmission, you will receive a postcard with your next transmission date.  Your next appointment:   9 month(s)  The format for your next appointment:   In Person  Provider:   Allegra Lai, MD   Thank  you for choosing [facility]!!   Trinidad Curet, RN [phone]

## 2021-04-28 NOTE — Progress Notes (Signed)
Remote pacemaker transmission.   

## 2021-05-08 ENCOUNTER — Other Ambulatory Visit: Payer: Self-pay | Admitting: Adult Health

## 2021-05-08 ENCOUNTER — Other Ambulatory Visit: Payer: Self-pay | Admitting: Family Medicine

## 2021-05-08 DIAGNOSIS — I495 Sick sinus syndrome: Secondary | ICD-10-CM

## 2021-05-10 ENCOUNTER — Other Ambulatory Visit: Payer: Self-pay

## 2021-05-10 ENCOUNTER — Telehealth: Payer: Self-pay | Admitting: Family Medicine

## 2021-05-10 DIAGNOSIS — I495 Sick sinus syndrome: Secondary | ICD-10-CM

## 2021-05-10 MED ORDER — ROSUVASTATIN CALCIUM 40 MG PO TABS: 40.0000 mg | ORAL_TABLET | Freq: Every day | ORAL | 0 refills | Status: DC

## 2021-05-10 NOTE — Telephone Encounter (Signed)
Done KH 

## 2021-05-10 NOTE — Telephone Encounter (Signed)
Pt's daughter called for refills of rosuvastatin. Please send to [facility] on [address].

## 2021-07-08 ENCOUNTER — Other Ambulatory Visit: Payer: Self-pay | Admitting: Family Medicine

## 2021-07-08 DIAGNOSIS — Z8719 Personal history of other diseases of the digestive system: Secondary | ICD-10-CM

## 2021-07-14 ENCOUNTER — Other Ambulatory Visit: Payer: Self-pay | Admitting: Family Medicine

## 2021-07-14 DIAGNOSIS — J45909 Unspecified asthma, uncomplicated: Secondary | ICD-10-CM

## 2021-07-14 NOTE — Telephone Encounter (Signed)
[facility] is requesting to fill pt flovent. Please advise [facility]

## 2021-07-15 NOTE — Telephone Encounter (Signed)
[facility] is requesting to fill pt ventolin inhaler. Please advise [facility]

## 2021-07-15 NOTE — Telephone Encounter (Signed)
Please see previous message. [place]

## 2021-07-20 ENCOUNTER — Ambulatory Visit (INDEPENDENT_AMBULATORY_CARE_PROVIDER_SITE_OTHER): Payer: Medicare Other

## 2021-07-20 DIAGNOSIS — I495 Sick sinus syndrome: Secondary | ICD-10-CM | POA: Diagnosis not present

## 2021-07-20 LAB — CUP PACEART REMOTE DEVICE CHECK
Battery Remaining Longevity: 103 mo
Battery Remaining Percentage: 95.5 %
Battery Voltage: 3.01 V
Brady Statistic AP VP Percent: 14 %
Brady Statistic AP VS Percent: 0 %
Brady Statistic AS VP Percent: 85 %
Brady Statistic AS VS Percent: 1 %
Brady Statistic RA Percent Paced: 1 %
Brady Statistic RV Percent Paced: 99 %
Date Time Interrogation Session: 20230228020015
Implantable Lead Implant Date: 20220826
Implantable Lead Implant Date: 20220826
Implantable Lead Location: 753859
Implantable Lead Location: 753860
Implantable Pulse Generator Implant Date: 20220826
Lead Channel Impedance Value: 440 Ohm
Lead Channel Impedance Value: 510 Ohm
Lead Channel Pacing Threshold Amplitude: 0.75 V
Lead Channel Pacing Threshold Pulse Width: 0.5 ms
Lead Channel Sensing Intrinsic Amplitude: 12 mV
Lead Channel Sensing Intrinsic Amplitude: 3.6 mV
Lead Channel Setting Pacing Amplitude: 2 V
Lead Channel Setting Pacing Amplitude: 2.5 V
Lead Channel Setting Pacing Pulse Width: 0.5 ms
Lead Channel Setting Sensing Sensitivity: 2 mV
Pulse Gen Model: 2272
Pulse Gen Serial Number: 3953466

## 2021-07-27 NOTE — Progress Notes (Signed)
Remote pacemaker transmission.   

## 2021-08-06 DIAGNOSIS — Z20822 Contact with and (suspected) exposure to covid-19: Secondary | ICD-10-CM | POA: Diagnosis not present

## 2021-08-16 ENCOUNTER — Ambulatory Visit (INDEPENDENT_AMBULATORY_CARE_PROVIDER_SITE_OTHER): Payer: Medicare Other | Admitting: Cardiovascular Disease

## 2021-08-16 ENCOUNTER — Encounter: Payer: Self-pay | Admitting: Cardiovascular Disease

## 2021-08-16 ENCOUNTER — Other Ambulatory Visit: Payer: Self-pay

## 2021-08-16 ENCOUNTER — Ambulatory Visit: Payer: Medicare Other | Admitting: Cardiovascular Disease

## 2021-08-16 VITALS — BP 108/70 | HR 70 | Ht 62.0 in | Wt 85.0 lb

## 2021-08-16 DIAGNOSIS — I25119 Atherosclerotic heart disease of native coronary artery with unspecified angina pectoris: Secondary | ICD-10-CM

## 2021-08-16 DIAGNOSIS — I495 Sick sinus syndrome: Secondary | ICD-10-CM

## 2021-08-16 DIAGNOSIS — I350 Nonrheumatic aortic (valve) stenosis: Secondary | ICD-10-CM

## 2021-08-16 DIAGNOSIS — I4891 Unspecified atrial fibrillation: Secondary | ICD-10-CM

## 2021-08-16 DIAGNOSIS — I480 Paroxysmal atrial fibrillation: Secondary | ICD-10-CM | POA: Diagnosis not present

## 2021-08-16 DIAGNOSIS — I6523 Occlusion and stenosis of bilateral carotid arteries: Secondary | ICD-10-CM | POA: Diagnosis not present

## 2021-08-16 MED ORDER — APIXABAN 2.5 MG PO TABS: ORAL_TABLET | ORAL | 5 refills | Status: DC

## 2021-08-16 NOTE — Progress Notes (Signed)
? ?Chief Complaint  ?Patient presents with  ? Follow-up  ?  CAD  ? ?History of Present Illness: 88 yo female with history of CAD s/p CABG, atrial fib s/p MAZE procedure, CVA, carotid artery disease s/p right CEA, fibromyalgia, GERD, mild to moderate MR, severe AS s/p TAVR and sick sinus syndrome s/p pacemaker who is here for cardiac follow up. She underwent CABG in 2006 with MAZE procedure. She was found to have severe AS by echo in July 2017 along with moderate MR. She initially refused treatment but underwent TAVR August 2018 with placement of a 23 mm Edwards Sapien 3 valve from the right femoral artery. She is known to have atrial fibrillation and has been on Eliquis. Cardiac monitor in 2018 in setting of dizziness showed pauses and possible second degree AV block. She was seen in the EP clinic by Dr. Lennie Odor and Cardizem was changed to Lopressor. Cardiac cath 10/27/16 with 3/4 patent bypass grafts. The LIMA to the Diagonal was atretic but vein grafts were open to the LAD, Circumflex and distal RCA.   She is known to have moderate carotid disease by last dopplers in June 2018. Echo August 2019 with LVEF=60-65%. Normally functioning bioprosthetic AVR. Mild mitral regurgitation. She was admitted with syncope and atrial fibrillation in August 2022 and found to have sick sinus syndrome. A pacemaker was placed. Echo August 2022 with LVEF=55-60%, mild to moderate MR. Normally functioning bioprosthetic AVR. ? ?She is here today for follow up. The patient denies any chest pain, dyspnea, palpitations, lower extremity edema, orthopnea, PND, dizziness, near syncope or syncope.  ? ?Primary Care Physician: Denita Lung, MD ? ?Past Medical History:  ?Diagnosis Date  ? Anxiety   ? Aortic Stenosis s/p TAVR   ? Echo 10/21: EF 55-60, no RWMA, mild asymmetric LVH, normal RVSF, RVSP 40.2 mmHg, mild MR, s/p TAVR with mean gradient 8.61mHg, no PVL  ? Arthritis   ? OSTEO  ? Aspirin allergy   ? on Plavix  ? Asthma   ? Bronchitis   ?  CAD (coronary artery disease)   ? a. s/p CABG 2006 with Cox-Maze procedure.  ? Carotid artery disease ([place]   ? a. s/p R CEA.  ? Chronic stable angina (HCC)   ? Diastolic dysfunction   ? Eczema   ? Fibromyalgia   ? GERD (gastroesophageal reflux disease)   ? Heart murmur   ? Hiatal hernia   ? Hyperlipidemia   ? Hypertension   ? Kyphoscoliosis   ? Mitral regurgitation   ? [facility]ocardial infarction ([facility]  ? Stent to CFX  ? PAF ([place]mal [place] fibrillation) [place]   ? a. pt has h/o hematuria on Eliquis and has since refused anticoagulation  ? Pneumonia 2015 ?  ? Pulmonary regurgitation   ? Skin cancer of arm   ? Stroke ([facility]   ? Tricuspid regurgitation   ? ? ?Past Surgical History:  ?Procedure Laterality Date  ? ABDOMINAL HYSTERECTOMY  1976  ? CAROTID ENDARTERECTOMY  2010  ? CORONARY ANGIOPLASTY WITH STENT PLACEMENT    ? CORONARY ARTERY BYPASS GRAFT  01/2005  ? LIMA-D1; SVG-LAD; SVG-OM; SVG-PDA  ? ESOPHAGOGASTRODUODENOSCOPY (EGD) WITH PROPOFOL N/A 03/19/2021  ? Procedure: ESOPHAGOGASTRODUODENOSCOPY (EGD) WITH PROPOFOL;  Surgeon: HCarol Ada MD;  [facility];  Service: Endoscopy;  Laterality: N/A;  ? EYE SURGERY    ? MULTIPLE EXTRACTIONS WITH ALVEOLOPLASTY  12/28/2016  ? Extraction of tooth #'s 2- 5,7-10, 12,13,17-20,and 22-29 with alveoloplasty and maxillary right and left lateral  exostoses reductions  ? MULTIPLE EXTRACTIONS WITH ALVEOLOPLASTY N/A 12/28/2016  ? Procedure: Extraction of tooth #'s 2- 5,7-10, 12,13,17-20,and 22-29 with alveoloplasty and maxillary right and left lateral exostoses reductions;  Surgeon: Lenn Cal, DDS;  Location: [place];  Service: Oral Surgery;  Laterality: N/A;  ? PACEMAKER IMPLANT N/A 01/15/2021  ? Procedure: PACEMAKER IMPLANT;  Surgeon: Constance Haw, MD;  Location: [place] CV LAB;  Service: Cardiovascular;  Laterality: N/A;  ? RIGHT/LEFT HEART CATH AND CORONARY/GRAFT ANGIOGRAPHY N/A 10/27/2016  ? Procedure: Right/Left Heart Cath and Coronary/Graft  Angiography;  Surgeon: Burnell Blanks, MD;  Location: [place] CV LAB;  Service: Cardiovascular;  Laterality: N/A;  ? SAVORY DILATION N/A 03/19/2021  ? Procedure: SAVORY DILATION;  Surgeon: Carol Ada, MD;  Location: [facility];  Service: Endoscopy;  Laterality: N/A;  ? TEE WITHOUT CARDIOVERSION N/A 01/03/2017  ? Procedure: TRANSESOPHAGEAL ECHOCARDIOGRAM (TEE);  Surgeon: Burnell Blanks, MD;  Location: [place];  Service: Open Heart Surgery;  Laterality: N/A;  ? TONSILLECTOMY    ? TRANSCATHETER AORTIC VALVE REPLACEMENT, TRANSFEMORAL N/A 01/03/2017  ? Procedure: TRANSCATHETER AORTIC VALVE REPLACEMENT, TRANSFEMORAL;  Surgeon: Burnell Blanks, MD;  Location: [place];  Service: Open Heart Surgery;  Laterality: N/A;  ? TUMOR REMOVAL    ? ? ?Current Outpatient Medications  ?Medication Sig Dispense Refill  ? acetaminophen (TYLENOL) 650 MG CR tablet Take 1,300 mg by mouth 2 (two) times daily as needed for pain.     ? Cholecalciferol (VITAMIN D-3) 25 MCG (1000 UT) CAPS Take 1,000 Units by mouth daily.    ? Ensure (ENSURE) Take 237 mLs by mouth daily.    ? fluticasone (FLONASE) 50 MCG/ACT nasal spray SHAKE LIQUID AND USE 2 SPRAYS IN EACH NOSTRIL EVERY DAY 48 g 0  ? fluticasone (FLOVENT HFA) 220 MCG/ACT inhaler INHALE 2 PUFFS INTO THE LUNGS TWICE DAILY 12 g 0  ? guaiFENesin (MUCINEX) 600 MG 12 hr tablet Take 1 tablet (600 mg total) by mouth 2 (two) times daily. 30 tablet 0  ? LINZESS 72 MCG capsule Take 1 capsule (72 mcg total) by mouth daily. 30 capsule 0  ? loratadine (CLARITIN) 10 MG tablet Take 1 tablet (10 mg total) by mouth daily. 30 tablet 0  ? nitroGLYCERIN (NITROSTAT) 0.4 MG SL tablet Take 1 tablet sublingually every 5 minutes as needed for chest pain, up to 3 doses 20 tablet 0  ? pantoprazole (PROTONIX) 40 MG tablet TAKE 1 TABLET BY MOUTH TWICE DAILY 180 tablet 1  ? polyethylene glycol powder (MIRALAX) 17 GM/SCOOP powder Take 17 g by mouth 2 (two) times daily as needed for moderate  constipation. 255 g 0  ? rosuvastatin (CRESTOR) 40 MG tablet Take 1 tablet (40 mg total) by mouth daily. 90 tablet 0  ? VENTOLIN HFA 108 (90 Base) MCG/ACT inhaler INHALE 2 PUFFS INTO THE LUNGS EVERY 6 HOURS AS NEEDED FOR WHEEZING OR SHORTNESS OF BREATH 18 g 0  ? Wheat Dextrin (BENEFIBER PO) Take 1 Scoop by mouth daily as needed (fiber).    ? apixaban (ELIQUIS) 2.5 MG TABS tablet TAKE 1 TABLET(2.5 MG) BY MOUTH TWICE DAILY 60 tablet 5  ? ?No current facility-administered medications for this visit.  ? ? ?Allergies  ?Allergen Reactions  ? Other Other (See Comments)  ?  NO ACIDIC, TART< OR SPICY FOODS- DEVELOPS REFLUX OFTEN!! ? ?PATIENT HAS TROUBLE SWALLOWING TABLETS!!  ? Bactrim [Sulfamethoxazole-Trimethoprim] Other (See Comments)  ?  "makes her feel funny" or "unsteady" ?  ? Amitriptyline  Hcl Rash  ? Aspirin Hives, Swelling, Rash and Other (See Comments)  ?  Body became swollen  ? Tape Other (See Comments)  ?  SKIN IS VERY THIN- WILL TEAR EASILY!!  ? Zetia [Ezetimibe] Rash  ? ? ?Social History  ? ?Socioeconomic History  ? Marital status: Widowed  ?  Spouse name: Not on file  ? Number of children: 3  ? Years of education: Not on file  ? Highest education level: Not on file  ?Occupational History  ?  Employer: RETIRED  ?Tobacco Use  ? Smoking status: Never  ? Smokeless tobacco: Former  ?  Types: Snuff  ?  Quit date: 05/23/2001  ? Tobacco comments:  ?  Former Snuff (02/04/2021)  ?Vaping Use  ? Vaping Use: Never used  ?Substance and Sexual Activity  ? Alcohol use: No  ? Drug use: No  ? Sexual activity: Not Currently  ?Other Topics Concern  ? Not on file  ?Social History Narrative  ? Not on file  ? ?Social Determinants of Health  ? ?Financial Resource Strain: Low Risk   ? Difficulty of Paying Living Expenses: Not hard at all  ?Food Insecurity: No Food Insecurity  ? Worried About Charity fundraiser in the Last Year: Never true  ? Ran Out of Food in the Last Year: Never true  ?Transportation Needs: No Transportation Needs  ?  Lack of Transportation (Medical): No  ? Lack of Transportation (Non-Medical): No  ?Physical Activity: Insufficiently Active  ? Days of Exercise per Week: 2 days  ? Minutes of Exercise per Session: 10 min  ?Stress: No St

## 2021-08-16 NOTE — Patient Instructions (Signed)
Medication Instructions:  ?No changes ?*If you need a refill on your cardiac medications before your next appointment, please call your pharmacy* ? ? ?Lab Work: ?none ?If you have labs (blood work) drawn today and your tests are completely normal, you will receive your results only by: ?MyChart Message (if you have MyChart) OR ?A paper copy in the mail ?If you have any lab test that is abnormal or we need to change your treatment, we will call you to review the results. ? ? ?Testing/Procedures: ?none ? ? ?Follow-Up: ?At [facility], you and your health needs are our priority.  As part of our continuing mission to provide you with exceptional heart care, we have created designated Provider Care Teams.  These Care Teams include your primary Cardiologist (physician) and Advanced Practice Providers (APPs -  Physician Assistants and Nurse Practitioners) who all work together to provide you with the care you need, when you need it. ? ?We recommend signing up for the patient portal called "MyChart".  Sign up information is provided on this After Visit Summary.  MyChart is used to connect with patients for Virtual Visits (Telemedicine).  Patients are able to view lab/test results, encounter notes, upcoming appointments, etc.  Non-urgent messages can be sent to your provider as well.   ?To learn more about what you can do with MyChart, go to [email or web address].   ? ?Your next appointment:   ?12 month(s) ? ?The format for your next appointment:   ?In Person ? ?Provider:   ?Christopher McAlhany, MD   ? ? ?Other Instructions ?  ?

## 2021-08-23 DIAGNOSIS — Z20822 Contact with and (suspected) exposure to covid-19: Secondary | ICD-10-CM | POA: Diagnosis not present

## 2021-08-26 ENCOUNTER — Other Ambulatory Visit: Payer: Self-pay | Admitting: Family Medicine

## 2021-08-26 DIAGNOSIS — I495 Sick sinus syndrome: Secondary | ICD-10-CM

## 2021-08-27 DIAGNOSIS — Z20822 Contact with and (suspected) exposure to covid-19: Secondary | ICD-10-CM | POA: Diagnosis not present

## 2021-09-20 DIAGNOSIS — Z20822 Contact with and (suspected) exposure to covid-19: Secondary | ICD-10-CM | POA: Diagnosis not present

## 2021-10-06 IMAGING — CT CT CTA ABD/PEL W/CM AND/OR W/O CM
2 of 16 series · 11 of 46 positions shown, 13 images · IV contrast (APPLIED)
Comparison: CT dated 11/01/2016.
COMPARISON: CT dated 11/01/2016.

Addendum:
CLINICAL DATA: 89-year-old female with hypotension. Rectal bleed.

EXAM:
CTA ABDOMEN AND PELVIS WITHOUT AND WITH CONTRAST
TECHNIQUE: Multidetector CT imaging of the abdomen and pelvis was performed
using the standard protocol during bolus administration of
intravenous contrast. Multiplanar reconstructed images and MIPs were
obtained and reviewed to evaluate the vascular anatomy.
CONTRAST:  75mL OMNIPAQUE IOHEXOL 350 MG/ML SOLN

[Series 15: cor · coronal · 0.78mm/px · 1 of 108 slices shown]
[im 54/108  soft-tissue]
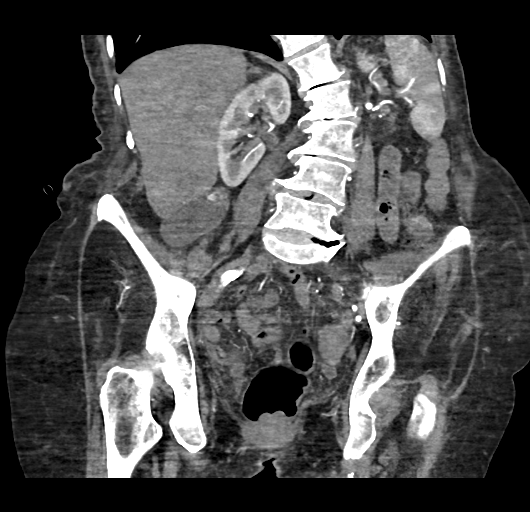

[Series 20: venous thins · axial · portal-venous · 0.79mm/px · z∈[+692,+975]mm · 10 of 867 slices shown, 12 images]
[im 79/867  soft-tissue]
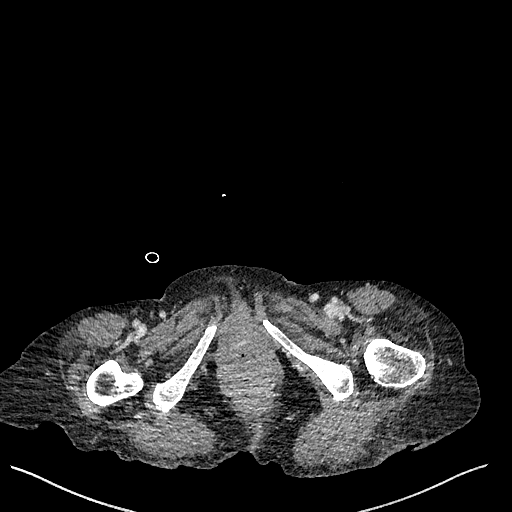
[im 79/867  bone]
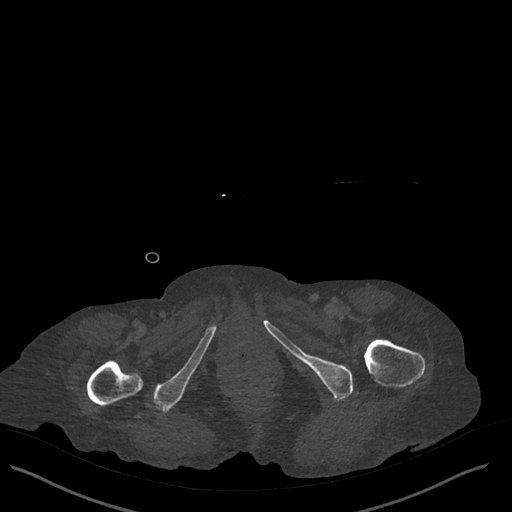
[im 158/867  soft-tissue]
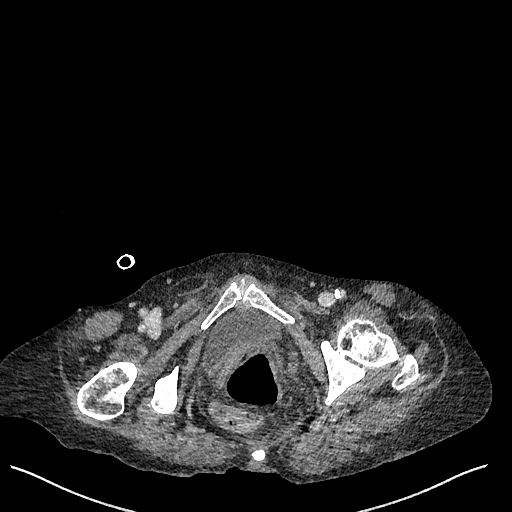
[im 237/867  soft-tissue]
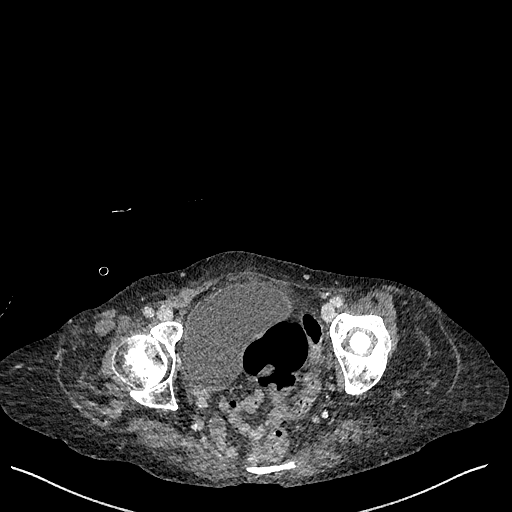
[im 315/867  soft-tissue]
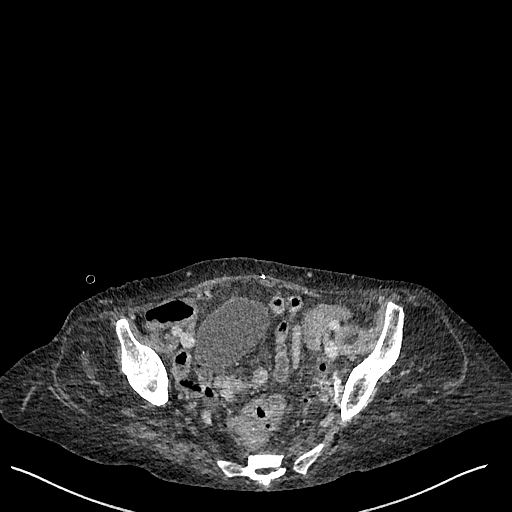
[im 394/867  soft-tissue]
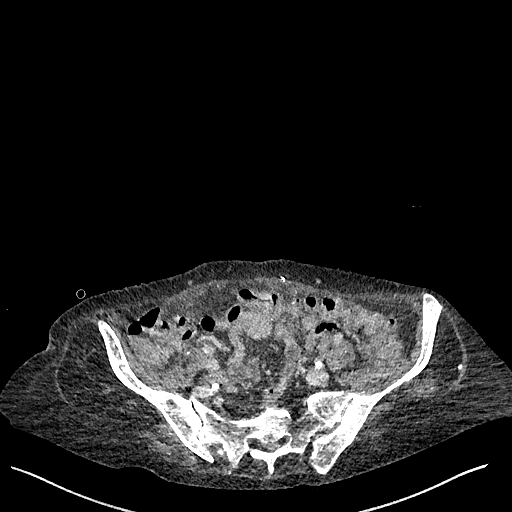
[im 473/867  soft-tissue]
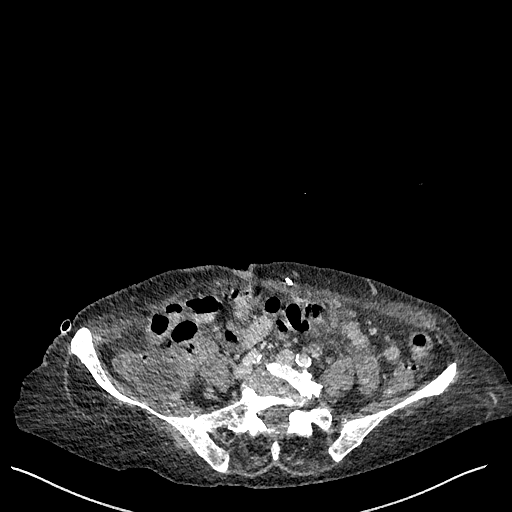
[im 552/867  soft-tissue]
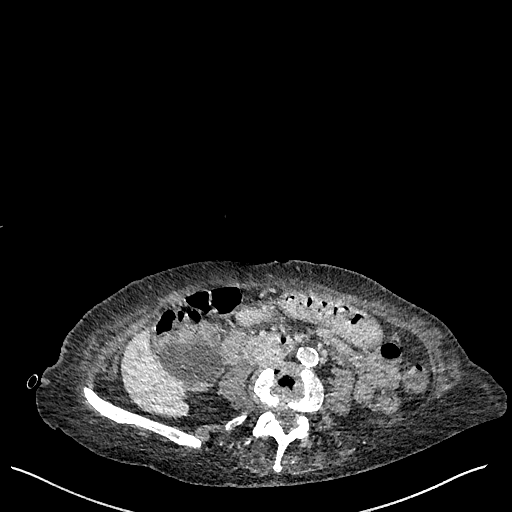
[im 630/867  soft-tissue]
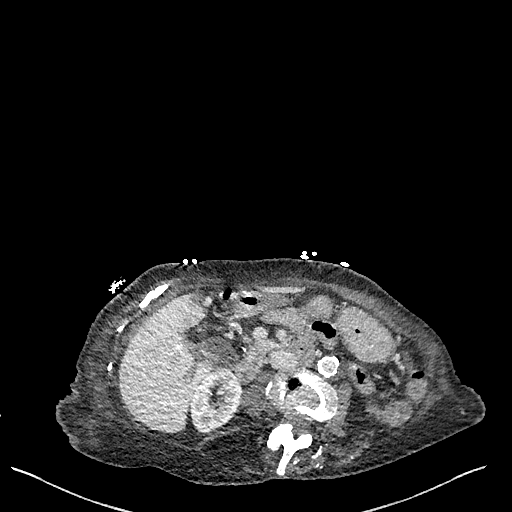
[im 709/867  soft-tissue]
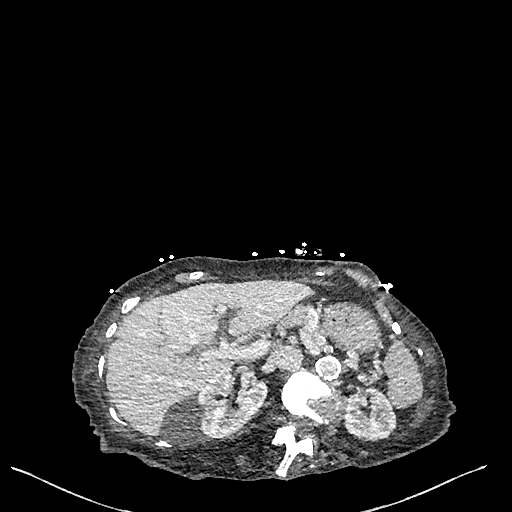
[im 709/867  bone]
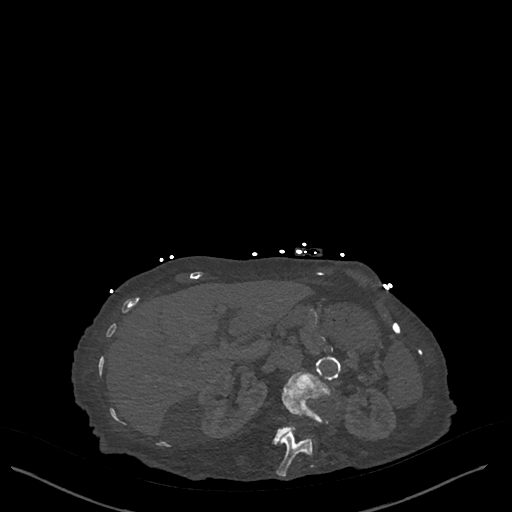
[im 788/867  soft-tissue]
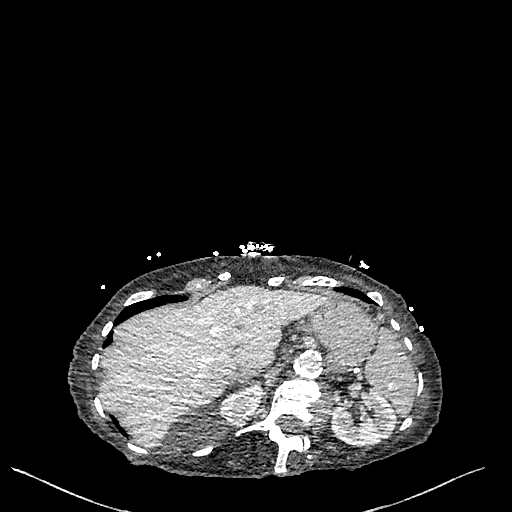

[11 of 46 positions shown; findings below may reference images not displayed]

FINDINGS: VASCULAR

Aorta: Advanced atherosclerotic calcification of the abdominal
aorta. The aorta is ectatic measuring up to 2.3 cm in diameter. No
aneurysmal dilatation or dissection. No periaortic fluid collection
or edema.

Celiac: The celiac axis and its major branches are patent.

SMA: The SMA is patent.

Renals: Atherosclerotic calcification of the origins of the renal
arteries. The renal arteries are patent.

IMA: The IMA is patent.

Inflow: Advanced atherosclerotic calcification of the iliac
arteries. No aneurysmal dilatation or dissection.

Proximal Outflow: Atherosclerotic calcification. No acute findings.

Veins: The IVC is unremarkable. The SMV, splenic vein, and main
portal vein are patent. No portal venous gas.

Review of the MIP images confirms the above findings.

NON-VASCULAR

Lower chest: The visualized lung bases are clear. There is
eventration of the left hemidiaphragm.

No intra-abdominal free air or free fluid.

Hepatobiliary: The liver is unremarkable. No intrahepatic biliary
dilatation. The gallbladder is distended. There are multiple stones
within the gallbladder. No pericholecystic fluid or evidence of
acute cholecystitis. Multiple small stones noted in the common bile
duct with combined length of approximately 1 cm (62/15).

Pancreas: Unremarkable. No pancreatic ductal dilatation or
surrounding inflammatory changes.

Spleen: Normal in size without focal abnormality.

Adrenals/Urinary Tract: The adrenal glands unremarkable. There is a
3 mm nonobstructing right renal inferior pole calculus. No
hydronephrosis. There is no hydronephrosis or nephrolithiasis on the
left. There is a 5 cm right renal upper pole cyst. The visualized
ureters and urinary bladder appear unremarkable.

Stomach/Bowel: There is sigmoid diverticulosis with segmental
muscular hypertrophy. There is perisigmoid stranding consistent with
acute diverticulitis. No diverticular abscess or perforation. There
is contrast within the lumen of the proximal transverse colon
(73/12, 73/19, and 42/15) not present on precontrast images
concerning for active GI bleed. Evaluation of the bowel however is
limited due to respiratory motion artifact. There is no bowel
obstruction. The appendix is not visualized with certainty. No
inflammatory changes identified in the right lower quadrant.

Lymphatic: No adenopathy.

Reproductive: Hysterectomy.

Other: None

Musculoskeletal: Osteopenia with scoliosis and degenerative changes
of the spine. No acute osseous pathology.
IMPRESSION: 1. Findings concerning for active GI bleed involving the proximal
transverse colon. Clinical correlation is recommended. A GI bleed
nuclear scan may provide additional information.
2. Sigmoid diverticulitis. No diverticular abscess or perforation.
3. Cholelithiasis and choledocholithiasis. No evidence of acute
cholecystitis.
4. A 3 mm nonobstructing right renal inferior pole calculus. No
hydronephrosis.

ADDENDUM:
Please note the high density content seen in the proximal transverse
colon on arterial phase does not appear to pool on the delayed
venous phase and may represent an artifact. A GI bleed nuclear scan
may provide better evaluation.

There is long segment thickening of the distal descending, and
sigmoid colon which may be related to muscular hypertrophy secondary
to chronic inflammation. There is however some perisigmoid stranding
and probable mild edema of the distal colonic wall which may
represent mild colitis or diverticulitis. Clinical correlation is
recommended. No abscess or perforation.

*** End of Addendum ***
FINDINGS: VASCULAR

Aorta: Advanced atherosclerotic calcification of the abdominal
aorta. The aorta is ectatic measuring up to 2.3 cm in diameter. No
aneurysmal dilatation or dissection. No periaortic fluid collection
or edema.

Celiac: The celiac axis and its major branches are patent.

SMA: The SMA is patent.

Renals: Atherosclerotic calcification of the origins of the renal
arteries. The renal arteries are patent.

IMA: The IMA is patent.

Inflow: Advanced atherosclerotic calcification of the iliac
arteries. No aneurysmal dilatation or dissection.

Proximal Outflow: Atherosclerotic calcification. No acute findings.

Veins: The IVC is unremarkable. The SMV, splenic vein, and main
portal vein are patent. No portal venous gas.

Review of the MIP images confirms the above findings.

NON-VASCULAR

Lower chest: The visualized lung bases are clear. There is
eventration of the left hemidiaphragm.

No intra-abdominal free air or free fluid.

Hepatobiliary: The liver is unremarkable. No intrahepatic biliary
dilatation. The gallbladder is distended. There are multiple stones
within the gallbladder. No pericholecystic fluid or evidence of
acute cholecystitis. Multiple small stones noted in the common bile
duct with combined length of approximately 1 cm (62/15).

Pancreas: Unremarkable. No pancreatic ductal dilatation or
surrounding inflammatory changes.

Spleen: Normal in size without focal abnormality.

Adrenals/Urinary Tract: The adrenal glands unremarkable. There is a
3 mm nonobstructing right renal inferior pole calculus. No
hydronephrosis. There is no hydronephrosis or nephrolithiasis on the
left. There is a 5 cm right renal upper pole cyst. The visualized
ureters and urinary bladder appear unremarkable.

Stomach/Bowel: There is sigmoid diverticulosis with segmental
muscular hypertrophy. There is perisigmoid stranding consistent with
acute diverticulitis. No diverticular abscess or perforation. There
is contrast within the lumen of the proximal transverse colon
(73/12, 73/19, and 42/15) not present on precontrast images
concerning for active GI bleed. Evaluation of the bowel however is
limited due to respiratory motion artifact. There is no bowel
obstruction. The appendix is not visualized with certainty. No
inflammatory changes identified in the right lower quadrant.

Lymphatic: No adenopathy.

Reproductive: Hysterectomy.

Other: None

Musculoskeletal: Osteopenia with scoliosis and degenerative changes
of the spine. No acute osseous pathology.
IMPRESSION: 1. Findings concerning for active GI bleed involving the proximal
transverse colon. Clinical correlation is recommended. A GI bleed
nuclear scan may provide additional information.
2. Sigmoid diverticulitis. No diverticular abscess or perforation.
3. Cholelithiasis and choledocholithiasis. No evidence of acute
cholecystitis.
4. A 3 mm nonobstructing right renal inferior pole calculus. No
hydronephrosis.

## 2021-10-07 ENCOUNTER — Ambulatory Visit (INDEPENDENT_AMBULATORY_CARE_PROVIDER_SITE_OTHER): Payer: Medicare Other | Admitting: Family Medicine

## 2021-10-07 VITALS — BP 110/68 | HR 73 | Temp 98.6°F | Wt 75.2 lb

## 2021-10-07 DIAGNOSIS — I700 Atherosclerosis of aorta: Secondary | ICD-10-CM | POA: Diagnosis not present

## 2021-10-07 DIAGNOSIS — K8080 Other cholelithiasis without obstruction: Secondary | ICD-10-CM | POA: Diagnosis not present

## 2021-10-07 DIAGNOSIS — E43 Unspecified severe protein-calorie malnutrition: Secondary | ICD-10-CM | POA: Diagnosis not present

## 2021-10-07 DIAGNOSIS — I6523 Occlusion and stenosis of bilateral carotid arteries: Secondary | ICD-10-CM

## 2021-10-07 DIAGNOSIS — R634 Abnormal weight loss: Secondary | ICD-10-CM | POA: Diagnosis not present

## 2021-10-07 NOTE — Progress Notes (Signed)
   Subjective:    Patient ID: [name], female    DOB: 02-19-1929, 88 y.o.   MRN: [id number]  HPI She is here with her daughter.  Daughter states that over the last 3 weeks she has noted increased difficulty with nausea as well as anorexia.  She has apparently only vomited once.  Review of the record indicates she has had difficulty with her weight for quite some time.  Previous scanning did show evidence of cholelithiasis as well as aortic atherosclerosis. She also uses [employer].  She states that there is no particular food that causes difficulty.  The daughter also mentioned an abdominal hernia that tends to wax and wane especially when she eats.  Review of Systems     Objective:   Physical Exam Alert and quite thin appearing.  Throat is clear.  Neck is supple without adenopathy.  Systolic murmur is heard.  Lungs are clear to auscultation.  Abdominal exam shows no masses with normal bowel sounds. Previous blood work and CT scan from 2022 was evaluated.      Assessment & Plan:  Unexplained weight loss - Plan: CBC with Differential/Platelet, Comprehensive metabolic panel, Ambulatory referral to Gastroenterology  Protein-calorie malnutrition, severe - Plan: CBC with Differential/Platelet, Comprehensive metabolic panel, Ambulatory referral to Gastroenterology  Biliary calculus of other site without obstruction  Aortic atherosclerosis ([place]) It is difficult to figure out what exactly is going on with this lady but she has lost a significant amount of weight.  I will go ahead and refer her back to GI to get their input into this.  It does not sound like gallbladder disease but certainly a possibility.

## 2021-10-10 LAB — COMPREHENSIVE METABOLIC PANEL
ALT: 21 IU/L (ref 0–32)
AST: 29 IU/L (ref 0–40)
Albumin/Globulin Ratio: 1.8 (ref 1.2–2.2)
Albumin: 4.1 g/dL (ref 3.5–4.6)
Alkaline Phosphatase: 116 IU/L (ref 44–121)
BUN/Creatinine Ratio: 13 (ref 12–28)
BUN: 7 mg/dL — ABNORMAL LOW (ref 10–36)
Bilirubin Total: 0.5 mg/dL (ref 0.0–1.2)
CO2: 24 mmol/L (ref 20–29)
Calcium: 9.4 mg/dL (ref 8.7–10.3)
Chloride: 95 mmol/L — ABNORMAL LOW (ref 96–106)
Creatinine, Ser: 0.52 mg/dL — ABNORMAL LOW (ref 0.57–1.00)
Globulin, Total: 2.3 g/dL (ref 1.5–4.5)
Glucose: 107 mg/dL — ABNORMAL HIGH (ref 70–99)
Potassium: 2.9 mmol/L — ABNORMAL LOW (ref 3.5–5.2)
Sodium: 140 mmol/L (ref 134–144)
Total Protein: 6.4 g/dL (ref 6.0–8.5)
eGFR: 88 mL/min/{1.73_m2} (ref >59)

## 2021-10-10 LAB — CBC WITH DIFFERENTIAL/PLATELET
Basophils Absolute: 0 10*3/uL (ref 0.0–0.2)
Basos: 1 %
EOS (ABSOLUTE): 0.1 10*3/uL (ref 0.0–0.4)
Eos: 1 %
Hematocrit: 38.9 % (ref 34.0–46.6)
Hemoglobin: 13.2 g/dL (ref 11.1–15.9)
Immature Grans (Abs): 0 10*3/uL (ref 0.0–0.1)
Immature Granulocytes: 0 %
Lymphocytes Absolute: 1 10*3/uL (ref 0.7–3.1)
Lymphs: 15 %
MCH: 29.1 pg (ref 26.6–33.0)
MCHC: 33.9 g/dL (ref 31.5–35.7)
MCV: 86 fL (ref 79–97)
Monocytes Absolute: 0.5 10*3/uL (ref 0.1–0.9)
Monocytes: 8 %
Neutrophils Absolute: 4.9 10*3/uL (ref 1.4–7.0)
Neutrophils: 75 %
Platelets: 204 10*3/uL (ref 150–450)
RBC: 4.54 x10E6/uL (ref 3.77–5.28)
RDW: 13.2 % (ref 11.7–15.4)
WBC: 6.5 10*3/uL (ref 3.4–10.8)

## 2021-10-19 ENCOUNTER — Ambulatory Visit (INDEPENDENT_AMBULATORY_CARE_PROVIDER_SITE_OTHER): Payer: Medicare Other

## 2021-10-19 DIAGNOSIS — I495 Sick sinus syndrome: Secondary | ICD-10-CM

## 2021-10-19 LAB — CUP PACEART REMOTE DEVICE CHECK
Battery Remaining Longevity: 98 mo
Battery Remaining Percentage: 94 %
Battery Voltage: 3.01 V
Brady Statistic AP VP Percent: 15 %
Brady Statistic AP VS Percent: 1 %
Brady Statistic AS VP Percent: 84 %
Brady Statistic AS VS Percent: 1 %
Brady Statistic RA Percent Paced: 1 %
Brady Statistic RV Percent Paced: 99 %
Date Time Interrogation Session: 20230530020014
Implantable Lead Implant Date: 20220826
Implantable Lead Implant Date: 20220826
Implantable Lead Location: 753859
Implantable Lead Location: 753860
Implantable Pulse Generator Implant Date: 20220826
Lead Channel Impedance Value: 430 Ohm
Lead Channel Impedance Value: 490 Ohm
Lead Channel Pacing Threshold Amplitude: 0.75 V
Lead Channel Pacing Threshold Pulse Width: 0.5 ms
Lead Channel Sensing Intrinsic Amplitude: 12 mV
Lead Channel Sensing Intrinsic Amplitude: 2.9 mV
Lead Channel Setting Pacing Amplitude: 2 V
Lead Channel Setting Pacing Amplitude: 2.5 V
Lead Channel Setting Pacing Pulse Width: 0.5 ms
Lead Channel Setting Sensing Sensitivity: 2 mV
Pulse Gen Model: 2272
Pulse Gen Serial Number: 3953466

## 2021-10-29 ENCOUNTER — Other Ambulatory Visit: Payer: Self-pay | Admitting: Cardiovascular Disease

## 2021-11-03 NOTE — Progress Notes (Signed)
Remote pacemaker transmission.   

## 2021-11-09 DIAGNOSIS — K8020 Calculus of gallbladder without cholecystitis without obstruction: Secondary | ICD-10-CM | POA: Diagnosis not present

## 2021-11-09 DIAGNOSIS — R627 Adult failure to thrive: Secondary | ICD-10-CM | POA: Diagnosis not present

## 2021-11-09 DIAGNOSIS — K5904 Chronic idiopathic constipation: Secondary | ICD-10-CM | POA: Diagnosis not present

## 2021-11-09 DIAGNOSIS — K8050 Calculus of bile duct without cholangitis or cholecystitis without obstruction: Secondary | ICD-10-CM | POA: Diagnosis not present

## 2021-11-09 DIAGNOSIS — K449 Diaphragmatic hernia without obstruction or gangrene: Secondary | ICD-10-CM | POA: Diagnosis not present

## 2021-11-09 DIAGNOSIS — K5730 Diverticulosis of large intestine without perforation or abscess without bleeding: Secondary | ICD-10-CM | POA: Diagnosis not present

## 2021-11-09 DIAGNOSIS — K219 Gastro-esophageal reflux disease without esophagitis: Secondary | ICD-10-CM | POA: Diagnosis not present

## 2021-11-09 DIAGNOSIS — R634 Abnormal weight loss: Secondary | ICD-10-CM | POA: Diagnosis not present

## 2021-11-09 DIAGNOSIS — Z86010 Personal history of colonic polyps: Secondary | ICD-10-CM | POA: Diagnosis not present

## 2021-12-03 ENCOUNTER — Telehealth: Payer: Self-pay | Admitting: Family Medicine

## 2021-12-03 NOTE — Telephone Encounter (Signed)
[name] states [name] wants to make some changes to her living Will but they will not let her until her doctor writes a letter to confirm she is competent enough to make the change.

## 2021-12-06 ENCOUNTER — Telehealth: Payer: Self-pay

## 2021-12-06 NOTE — Telephone Encounter (Signed)
Pt lawyer advised due to her age she will need a note to state that she is competent. Pease advise . [place]

## 2021-12-06 NOTE — Telephone Encounter (Signed)
Pts daughter wanted me to let you know that she returned your call.

## 2021-12-06 NOTE — Telephone Encounter (Signed)
Lvm for pt daughter to call back. [place]

## 2021-12-07 ENCOUNTER — Other Ambulatory Visit: Payer: Self-pay | Admitting: Family Medicine

## 2021-12-07 ENCOUNTER — Encounter: Payer: Self-pay | Admitting: Family Medicine

## 2021-12-07 DIAGNOSIS — I495 Sick sinus syndrome: Secondary | ICD-10-CM

## 2021-12-07 NOTE — Telephone Encounter (Signed)
Letter typed and called daughter and advised ready for pick up

## 2021-12-25 ENCOUNTER — Encounter (HOSPITAL_COMMUNITY): Payer: Self-pay | Admitting: Emergency Medicine

## 2021-12-25 ENCOUNTER — Emergency Department (HOSPITAL_COMMUNITY): Payer: Medicare Other

## 2021-12-25 ENCOUNTER — Other Ambulatory Visit: Payer: Self-pay

## 2021-12-25 ENCOUNTER — Inpatient Hospital Stay (HOSPITAL_COMMUNITY)
Admission: EM | Admit: 2021-12-25 | Discharge: 2021-12-30 | DRG: 444 | Disposition: A | Discharge status: Home Health | Payer: Medicare Other | Attending: Internal Medicine | Admitting: Internal Medicine

## 2021-12-25 DIAGNOSIS — R509 Fever, unspecified: Secondary | ICD-10-CM | POA: Diagnosis not present

## 2021-12-25 DIAGNOSIS — E876 Hypokalemia: Secondary | ICD-10-CM | POA: Diagnosis present

## 2021-12-25 DIAGNOSIS — I4510 Unspecified right bundle-branch block: Secondary | ICD-10-CM | POA: Diagnosis present

## 2021-12-25 DIAGNOSIS — I4891 Unspecified atrial fibrillation: Secondary | ICD-10-CM | POA: Diagnosis not present

## 2021-12-25 DIAGNOSIS — Z8673 Personal history of transient ischemic attack (TIA), and cerebral infarction without residual deficits: Secondary | ICD-10-CM

## 2021-12-25 DIAGNOSIS — I4819 Other persistent atrial fibrillation: Secondary | ICD-10-CM | POA: Diagnosis present

## 2021-12-25 DIAGNOSIS — I495 Sick sinus syndrome: Secondary | ICD-10-CM | POA: Diagnosis present

## 2021-12-25 DIAGNOSIS — R54 Age-related physical debility: Secondary | ICD-10-CM | POA: Diagnosis present

## 2021-12-25 DIAGNOSIS — R932 Abnormal findings on diagnostic imaging of liver and biliary tract: Secondary | ICD-10-CM | POA: Diagnosis not present

## 2021-12-25 DIAGNOSIS — F419 Anxiety disorder, unspecified: Secondary | ICD-10-CM | POA: Diagnosis not present

## 2021-12-25 DIAGNOSIS — Z8249 Family history of ischemic heart disease and other diseases of the circulatory system: Secondary | ICD-10-CM

## 2021-12-25 DIAGNOSIS — K8050 Calculus of bile duct without cholangitis or cholecystitis without obstruction: Secondary | ICD-10-CM | POA: Diagnosis not present

## 2021-12-25 DIAGNOSIS — K219 Gastro-esophageal reflux disease without esophagitis: Secondary | ICD-10-CM | POA: Diagnosis present

## 2021-12-25 DIAGNOSIS — I25118 Atherosclerotic heart disease of native coronary artery with other forms of angina pectoris: Secondary | ICD-10-CM | POA: Diagnosis not present

## 2021-12-25 DIAGNOSIS — Z66 Do not resuscitate: Secondary | ICD-10-CM | POA: Diagnosis not present

## 2021-12-25 DIAGNOSIS — R627 Adult failure to thrive: Secondary | ICD-10-CM | POA: Diagnosis not present

## 2021-12-25 DIAGNOSIS — K851 Biliary acute pancreatitis without necrosis or infection: Secondary | ICD-10-CM | POA: Diagnosis present

## 2021-12-25 DIAGNOSIS — R7989 Other specified abnormal findings of blood chemistry: Secondary | ICD-10-CM | POA: Diagnosis present

## 2021-12-25 DIAGNOSIS — K8062 Calculus of gallbladder and bile duct with acute cholecystitis without obstruction: Secondary | ICD-10-CM | POA: Diagnosis present

## 2021-12-25 DIAGNOSIS — K8000 Calculus of gallbladder with acute cholecystitis without obstruction: Secondary | ICD-10-CM

## 2021-12-25 DIAGNOSIS — Z86010 Personal history of colonic polyps: Secondary | ICD-10-CM

## 2021-12-25 DIAGNOSIS — I2510 Atherosclerotic heart disease of native coronary artery without angina pectoris: Secondary | ICD-10-CM | POA: Diagnosis present

## 2021-12-25 DIAGNOSIS — M549 Dorsalgia, unspecified: Secondary | ICD-10-CM | POA: Diagnosis not present

## 2021-12-25 DIAGNOSIS — I350 Nonrheumatic aortic (valve) stenosis: Secondary | ICD-10-CM | POA: Diagnosis not present

## 2021-12-25 DIAGNOSIS — Z85828 Personal history of other malignant neoplasm of skin: Secondary | ICD-10-CM

## 2021-12-25 DIAGNOSIS — E43 Unspecified severe protein-calorie malnutrition: Secondary | ICD-10-CM | POA: Diagnosis present

## 2021-12-25 DIAGNOSIS — Z515 Encounter for palliative care: Secondary | ICD-10-CM | POA: Diagnosis not present

## 2021-12-25 DIAGNOSIS — I248 Other forms of acute ischemic heart disease: Secondary | ICD-10-CM | POA: Diagnosis present

## 2021-12-25 DIAGNOSIS — R1013 Epigastric pain: Secondary | ICD-10-CM | POA: Diagnosis not present

## 2021-12-25 DIAGNOSIS — R531 Weakness: Secondary | ICD-10-CM | POA: Diagnosis not present

## 2021-12-25 DIAGNOSIS — Z955 Presence of coronary angioplasty implant and graft: Secondary | ICD-10-CM

## 2021-12-25 DIAGNOSIS — Z681 Body mass index (BMI) 19 or less, adult: Secondary | ICD-10-CM

## 2021-12-25 DIAGNOSIS — I110 Hypertensive heart disease with heart failure: Secondary | ICD-10-CM | POA: Diagnosis present

## 2021-12-25 DIAGNOSIS — E785 Hyperlipidemia, unspecified: Secondary | ICD-10-CM | POA: Diagnosis present

## 2021-12-25 DIAGNOSIS — Z952 Presence of prosthetic heart valve: Secondary | ICD-10-CM | POA: Diagnosis not present

## 2021-12-25 DIAGNOSIS — R110 Nausea: Secondary | ICD-10-CM | POA: Diagnosis not present

## 2021-12-25 DIAGNOSIS — Z888 Allergy status to other drugs, medicaments and biological substances status: Secondary | ICD-10-CM

## 2021-12-25 DIAGNOSIS — K810 Acute cholecystitis: Secondary | ICD-10-CM | POA: Diagnosis present

## 2021-12-25 DIAGNOSIS — Z01810 Encounter for preprocedural cardiovascular examination: Secondary | ICD-10-CM | POA: Diagnosis not present

## 2021-12-25 DIAGNOSIS — J45909 Unspecified asthma, uncomplicated: Secondary | ICD-10-CM | POA: Diagnosis present

## 2021-12-25 DIAGNOSIS — Z7401 Bed confinement status: Secondary | ICD-10-CM | POA: Diagnosis not present

## 2021-12-25 DIAGNOSIS — R1084 Generalized abdominal pain: Secondary | ICD-10-CM | POA: Diagnosis not present

## 2021-12-25 DIAGNOSIS — Z953 Presence of xenogenic heart valve: Secondary | ICD-10-CM

## 2021-12-25 DIAGNOSIS — I480 Paroxysmal atrial fibrillation: Secondary | ICD-10-CM | POA: Diagnosis not present

## 2021-12-25 DIAGNOSIS — Z01818 Encounter for other preprocedural examination: Secondary | ICD-10-CM | POA: Diagnosis not present

## 2021-12-25 DIAGNOSIS — Z993 Dependence on wheelchair: Secondary | ICD-10-CM

## 2021-12-25 DIAGNOSIS — Z951 Presence of aortocoronary bypass graft: Secondary | ICD-10-CM | POA: Diagnosis not present

## 2021-12-25 DIAGNOSIS — I081 Rheumatic disorders of both mitral and tricuspid valves: Secondary | ICD-10-CM | POA: Diagnosis present

## 2021-12-25 DIAGNOSIS — R1310 Dysphagia, unspecified: Secondary | ICD-10-CM | POA: Diagnosis present

## 2021-12-25 DIAGNOSIS — I252 Old myocardial infarction: Secondary | ICD-10-CM

## 2021-12-25 DIAGNOSIS — Z20822 Contact with and (suspected) exposure to covid-19: Secondary | ICD-10-CM | POA: Diagnosis present

## 2021-12-25 DIAGNOSIS — R0602 Shortness of breath: Secondary | ICD-10-CM | POA: Diagnosis not present

## 2021-12-25 DIAGNOSIS — M1990 Unspecified osteoarthritis, unspecified site: Secondary | ICD-10-CM | POA: Diagnosis present

## 2021-12-25 DIAGNOSIS — M797 Fibromyalgia: Secondary | ICD-10-CM | POA: Diagnosis present

## 2021-12-25 DIAGNOSIS — R197 Diarrhea, unspecified: Secondary | ICD-10-CM | POA: Diagnosis not present

## 2021-12-25 DIAGNOSIS — I10 Essential (primary) hypertension: Secondary | ICD-10-CM | POA: Diagnosis not present

## 2021-12-25 DIAGNOSIS — Z7901 Long term (current) use of anticoagulants: Secondary | ICD-10-CM

## 2021-12-25 DIAGNOSIS — Z79899 Other long term (current) drug therapy: Secondary | ICD-10-CM

## 2021-12-25 DIAGNOSIS — R1111 Vomiting without nausea: Secondary | ICD-10-CM | POA: Diagnosis not present

## 2021-12-25 DIAGNOSIS — I340 Nonrheumatic mitral (valve) insufficiency: Secondary | ICD-10-CM | POA: Diagnosis not present

## 2021-12-25 DIAGNOSIS — I959 Hypotension, unspecified: Secondary | ICD-10-CM | POA: Diagnosis not present

## 2021-12-25 DIAGNOSIS — R112 Nausea with vomiting, unspecified: Principal | ICD-10-CM

## 2021-12-25 DIAGNOSIS — Z950 Presence of cardiac pacemaker: Secondary | ICD-10-CM

## 2021-12-25 DIAGNOSIS — I5032 Chronic diastolic (congestive) heart failure: Secondary | ICD-10-CM | POA: Diagnosis present

## 2021-12-25 DIAGNOSIS — R42 Dizziness and giddiness: Secondary | ICD-10-CM | POA: Diagnosis not present

## 2021-12-25 DIAGNOSIS — Z8679 Personal history of other diseases of the circulatory system: Secondary | ICD-10-CM

## 2021-12-25 DIAGNOSIS — K5909 Other constipation: Secondary | ICD-10-CM | POA: Diagnosis present

## 2021-12-25 DIAGNOSIS — Z886 Allergy status to analgesic agent status: Secondary | ICD-10-CM

## 2021-12-25 DIAGNOSIS — Z882 Allergy status to sulfonamides status: Secondary | ICD-10-CM

## 2021-12-25 DIAGNOSIS — Z90710 Acquired absence of both cervix and uterus: Secondary | ICD-10-CM

## 2021-12-25 DIAGNOSIS — R778 Other specified abnormalities of plasma proteins: Secondary | ICD-10-CM | POA: Diagnosis present

## 2021-12-25 DIAGNOSIS — K8020 Calculus of gallbladder without cholecystitis without obstruction: Secondary | ICD-10-CM | POA: Diagnosis not present

## 2021-12-25 DIAGNOSIS — I361 Nonrheumatic tricuspid (valve) insufficiency: Secondary | ICD-10-CM | POA: Diagnosis not present

## 2021-12-25 DIAGNOSIS — K838 Other specified diseases of biliary tract: Secondary | ICD-10-CM | POA: Diagnosis not present

## 2021-12-25 LAB — CBC WITH DIFFERENTIAL/PLATELET
Abs Immature Granulocytes: 0.05 10*3/uL (ref 0.00–0.07)
Basophils Absolute: 0 10*3/uL (ref 0.0–0.1)
Basophils Relative: 0 %
Eosinophils Absolute: 0 10*3/uL (ref 0.0–0.5)
Eosinophils Relative: 0 %
HCT: 39.2 % (ref 36.0–46.0)
Hemoglobin: 12.7 g/dL (ref 12.0–15.0)
Immature Granulocytes: 0 %
Lymphocytes Relative: 5 %
Lymphs Abs: 0.6 10*3/uL — ABNORMAL LOW (ref 0.7–4.0)
MCH: 30.5 pg (ref 26.0–34.0)
MCHC: 32.4 g/dL (ref 30.0–36.0)
MCV: 94 fL (ref 80.0–100.0)
Monocytes Absolute: 0.4 10*3/uL (ref 0.1–1.0)
Monocytes Relative: 3 %
Neutro Abs: 11.2 10*3/uL — ABNORMAL HIGH (ref 1.7–7.7)
Neutrophils Relative %: 92 %
Platelets: 152 10*3/uL (ref 150–400)
RBC: 4.17 MIL/uL (ref 3.87–5.11)
RDW: 14.6 % (ref 11.5–15.5)
WBC: 12.3 10*3/uL — ABNORMAL HIGH (ref 4.0–10.5)
nRBC: 0 % (ref 0.0–0.2)

## 2021-12-25 LAB — COMPREHENSIVE METABOLIC PANEL
ALT: 18 U/L (ref 0–44)
AST: 40 U/L (ref 15–41)
Albumin: 3.9 g/dL (ref 3.5–5.0)
Alkaline Phosphatase: 41 U/L (ref 38–126)
Anion gap: 7 (ref 5–15)
BUN: 11 mg/dL (ref 8–23)
CO2: 27 mmol/L (ref 22–32)
Calcium: 9.1 mg/dL (ref 8.9–10.3)
Chloride: 106 mmol/L (ref 98–111)
Creatinine, Ser: 0.54 mg/dL (ref 0.44–1.00)
GFR, Estimated: >60 mL/min (ref >60)
Glucose, Bld: 121 mg/dL — ABNORMAL HIGH (ref 70–99)
Potassium: 3.4 mmol/L — ABNORMAL LOW (ref 3.5–5.1)
Sodium: 140 mmol/L (ref 135–145)
Total Bilirubin: 1 mg/dL (ref 0.3–1.2)
Total Protein: 6.6 g/dL (ref 6.5–8.1)

## 2021-12-25 LAB — I-STAT CHEM 8, ED
BUN: 12 mg/dL (ref 8–23)
Calcium, Ion: 1.2 mmol/L (ref 1.15–1.40)
Chloride: 103 mmol/L (ref 98–111)
Creatinine, Ser: 0.3 mg/dL — ABNORMAL LOW (ref 0.44–1.00)
Glucose, Bld: 112 mg/dL — ABNORMAL HIGH (ref 70–99)
HCT: 39 % (ref 36.0–46.0)
Hemoglobin: 13.3 g/dL (ref 12.0–15.0)
Potassium: 3.3 mmol/L — ABNORMAL LOW (ref 3.5–5.1)
Sodium: 140 mmol/L (ref 135–145)
TCO2: 25 mmol/L (ref 22–32)

## 2021-12-25 LAB — TROPONIN I (HIGH SENSITIVITY): Troponin I (High Sensitivity): 19 ng/L — ABNORMAL HIGH (ref <18)

## 2021-12-25 LAB — LIPASE, BLOOD: Lipase: 3580 U/L — ABNORMAL HIGH (ref 11–51)

## 2021-12-25 MED ORDER — ONDANSETRON 4 MG PO TBDP: ORAL_TABLET | ORAL | 0 refills | Status: DC

## 2021-12-25 MED ORDER — SODIUM CHLORIDE 0.9 % IV BOLUS
500.0000 mL | Freq: Once | INTRAVENOUS | Status: AC
  Administered 2021-12-25: 500 mL via INTRAVENOUS

## 2021-12-25 MED ORDER — FAMOTIDINE IN NACL 20-0.9 MG/50ML-% IV SOLN
20.0000 mg | Freq: Once | INTRAVENOUS | Status: AC
  Administered 2021-12-25: 20 mg via INTRAVENOUS
  Filled 2021-12-25: qty 50

## 2021-12-25 NOTE — ED Triage Notes (Signed)
Patient arrived via [facility] with complaints of upper quadrant abdominal pain with nausea and loose stool, onset x1 day. Patient alerts and oriented x4 at arrival to ED. Patient given 321m normal saline and '4mg'$  zofran en-route.  Per EMS: BP-124/62 HR-86 SpO-96% on RA CBG-128

## 2021-12-25 NOTE — ED Provider Notes (Signed)
[facility] EMERGENCY DEPARTMENT Provider Note   CSN: [id number] Arrival date & time: 12/25/21  2135     History  Chief Complaint  Patient presents with   Abdominal Pain    N/Loose stool    GENEVIE ELMAN is a 88 y.o. female history of CABG, A-fib on Eliquis, here presenting abdominal pain and vomiting.  Patient states that she has been vomiting today.  Also several episodes of loose stools and abdominal cramps.  Patient denies any chest pain.  Patient was given Zofran prior to arrival by EMS and felt better  The history is provided by the patient.       Home Medications Prior to Admission medications   Medication Sig Start Date End Date Taking? Authorizing Provider  acetaminophen (TYLENOL) 650 MG CR tablet Take 1,300 mg by mouth 2 (two) times daily as needed for pain.     [provider]  apixaban (ELIQUIS) 2.5 MG TABS tablet TAKE 1 TABLET(2.5 MG) BY MOUTH TWICE DAILY 08/16/21   Burnell Blanks, MD  Cholecalciferol (VITAMIN D-3) 25 MCG (1000 UT) CAPS Take 1,000 Units by mouth daily.    [provider]  Ensure (ENSURE) Take 237 mLs by mouth daily.    [provider]  fluticasone (FLONASE) 50 MCG/ACT nasal spray SHAKE LIQUID AND USE 2 SPRAYS IN EACH NOSTRIL EVERY DAY 02/10/21   Medina-Vargas, Monina C, NP  fluticasone (FLOVENT HFA) 220 MCG/ACT inhaler INHALE 2 PUFFS INTO THE LUNGS TWICE DAILY 07/14/21   Denita Lung, MD  guaiFENesin (MUCINEX) 600 MG 12 hr tablet Take 1 tablet (600 mg total) by mouth 2 (two) times daily. 05/25/14   Regalado, Belkys A, MD  LINZESS 72 MCG capsule Take 1 capsule (72 mcg total) by mouth daily. 02/10/21   Medina-Vargas, Monina C, NP  loratadine (CLARITIN) 10 MG tablet Take 1 tablet (10 mg total) by mouth daily. 02/10/21   Medina-Vargas, Monina C, NP  nitroGLYCERIN (NITROSTAT) 0.4 MG SL tablet DISSOLVE 1 TABLET UNDER THE TONGUE EVERY 5 MINUTES AS NEEDED FOR CHEST PAIN 11/01/21   Burnell Blanks, MD   pantoprazole (PROTONIX) 40 MG tablet TAKE 1 TABLET BY MOUTH TWICE DAILY 07/08/21   Denita Lung, MD  polyethylene glycol powder (MIRALAX) 17 GM/SCOOP powder Take 17 g by mouth 2 (two) times daily as needed for moderate constipation. Patient not taking: Reported on 10/07/2021 02/10/21   Medina-Vargas, Monina C, NP  rosuvastatin (CRESTOR) 40 MG tablet TAKE 1 TABLET(40 MG) BY MOUTH DAILY 12/07/21   Denita Lung, MD  VENTOLIN HFA 108 [phone] Base) MCG/ACT inhaler INHALE 2 PUFFS INTO THE LUNGS EVERY 6 HOURS AS NEEDED FOR WHEEZING OR SHORTNESS OF BREATH 07/15/21   Denita Lung, MD  Wheat Dextrin (BENEFIBER PO) Take 1 Scoop by mouth daily as needed (fiber).    [provider]      Allergies    Other, Bactrim [sulfamethoxazole-trimethoprim], Amitriptyline hcl, Aspirin, Tape, and Zetia [ezetimibe]    Review of Systems   Review of Systems  Gastrointestinal:  Positive for abdominal pain and vomiting.  All other systems reviewed and are negative.   Physical Exam Updated Vital Signs BP (!) 156/71 (BP Location: Right Arm)   Pulse 94   Temp 98.6 F (37 C) (Oral)   Resp 16   SpO2 96%  Physical Exam Vitals and nursing note reviewed.  Constitutional:      Appearance: She is well-developed.  HENT:     Head: Normocephalic.  Comments: Slightly dehydrated Eyes:     Extraocular Movements: Extraocular movements intact.  Cardiovascular:     Rate and Rhythm: Normal rate and regular rhythm.     Heart sounds: Normal heart sounds.  Pulmonary:     Effort: Pulmonary effort is normal.     Breath sounds: Normal breath sounds.  Abdominal:     General: Abdomen is flat.     Comments: Mild epigastric tenderness  Skin:    General: Skin is warm.     Capillary Refill: Capillary refill takes less than 2 seconds.  Neurological:     General: No focal deficit present.     Mental Status: She is alert and oriented to person, place, and time.  Psychiatric:        Mood and Affect: Mood normal.         Behavior: Behavior normal.     ED Results / Procedures / Treatments   Labs (all labs ordered are listed, but only abnormal results are displayed) Labs Reviewed  CBC WITH DIFFERENTIAL/PLATELET - Abnormal; Notable for the following components:      Result Value   WBC 12.3 (*)    Neutro Abs 11.2 (*)    Lymphs Abs 0.6 (*)    All other components within normal limits  I-STAT CHEM 8, ED - Abnormal; Notable for the following components:   Potassium 3.3 (*)    Creatinine, Ser 0.30 (*)    Glucose, Bld 112 (*)    All other components within normal limits  COMPREHENSIVE METABOLIC PANEL  LIPASE, BLOOD  TROPONIN I (HIGH SENSITIVITY)    EKG None  Radiology No results found.  Procedures Procedures    Medications Ordered in ED Medications  famotidine (PEPCID) IVPB 20 mg premix (20 mg Intravenous New Bag/Given 12/25/21 2214)  sodium chloride 0.9 % bolus 500 mL (500 mLs Intravenous New Bag/Given 12/25/21 2214)    ED Course/ Medical Decision Making/ A&P                           Medical Decision Making SHRITHA BRESEE is a 88 y.o. female here with abdominal pain and vomiting and diarrhea. Likely viral gastroenteritis.  However consider small bowel obstruction as well.  Plan to get CBC and CMP and lipase and CT abdomen pelvis.  Will hydrate and reassess.  11:30 PM I reviewed patient's labs and they were unremarkable.  CT abdomen pelvis is pending.  Signed out to Dr. Leonette Monarch in the ED.  Dissipate discharge home with Zofran if CT unremarkable  Amount and/or Complexity of Data Reviewed Labs: ordered. Decision-making details documented in ED Course. Radiology: ordered. ECG/medicine tests: ordered.  Risk Prescription drug management.    Final Clinical Impression(s) / ED Diagnoses Final diagnoses:  None    Rx / DC Orders ED Discharge Orders     None         Drenda Freeze, MD 12/25/21 2334

## 2021-12-26 ENCOUNTER — Emergency Department (HOSPITAL_COMMUNITY): Payer: Medicare Other

## 2021-12-26 DIAGNOSIS — I4510 Unspecified right bundle-branch block: Secondary | ICD-10-CM | POA: Diagnosis present

## 2021-12-26 DIAGNOSIS — I4891 Unspecified atrial fibrillation: Secondary | ICD-10-CM | POA: Diagnosis not present

## 2021-12-26 DIAGNOSIS — K8020 Calculus of gallbladder without cholecystitis without obstruction: Secondary | ICD-10-CM | POA: Diagnosis not present

## 2021-12-26 DIAGNOSIS — Z952 Presence of prosthetic heart valve: Secondary | ICD-10-CM | POA: Diagnosis not present

## 2021-12-26 DIAGNOSIS — I2510 Atherosclerotic heart disease of native coronary artery without angina pectoris: Secondary | ICD-10-CM | POA: Diagnosis present

## 2021-12-26 DIAGNOSIS — I480 Paroxysmal atrial fibrillation: Secondary | ICD-10-CM | POA: Diagnosis not present

## 2021-12-26 DIAGNOSIS — I361 Nonrheumatic tricuspid (valve) insufficiency: Secondary | ICD-10-CM | POA: Diagnosis not present

## 2021-12-26 DIAGNOSIS — R509 Fever, unspecified: Secondary | ICD-10-CM | POA: Diagnosis not present

## 2021-12-26 DIAGNOSIS — R42 Dizziness and giddiness: Secondary | ICD-10-CM | POA: Diagnosis not present

## 2021-12-26 DIAGNOSIS — K851 Biliary acute pancreatitis without necrosis or infection: Secondary | ICD-10-CM

## 2021-12-26 DIAGNOSIS — R1013 Epigastric pain: Secondary | ICD-10-CM | POA: Diagnosis not present

## 2021-12-26 DIAGNOSIS — I25118 Atherosclerotic heart disease of native coronary artery with other forms of angina pectoris: Secondary | ICD-10-CM | POA: Diagnosis not present

## 2021-12-26 DIAGNOSIS — R531 Weakness: Secondary | ICD-10-CM | POA: Diagnosis not present

## 2021-12-26 DIAGNOSIS — M549 Dorsalgia, unspecified: Secondary | ICD-10-CM | POA: Diagnosis not present

## 2021-12-26 DIAGNOSIS — I5032 Chronic diastolic (congestive) heart failure: Secondary | ICD-10-CM | POA: Diagnosis present

## 2021-12-26 DIAGNOSIS — K8050 Calculus of bile duct without cholangitis or cholecystitis without obstruction: Secondary | ICD-10-CM | POA: Diagnosis not present

## 2021-12-26 DIAGNOSIS — F419 Anxiety disorder, unspecified: Secondary | ICD-10-CM | POA: Diagnosis not present

## 2021-12-26 DIAGNOSIS — Z953 Presence of xenogenic heart valve: Secondary | ICD-10-CM | POA: Diagnosis not present

## 2021-12-26 DIAGNOSIS — Z7401 Bed confinement status: Secondary | ICD-10-CM | POA: Diagnosis not present

## 2021-12-26 DIAGNOSIS — K838 Other specified diseases of biliary tract: Secondary | ICD-10-CM | POA: Diagnosis not present

## 2021-12-26 DIAGNOSIS — R1310 Dysphagia, unspecified: Secondary | ICD-10-CM | POA: Diagnosis present

## 2021-12-26 DIAGNOSIS — Z01810 Encounter for preprocedural cardiovascular examination: Secondary | ICD-10-CM | POA: Diagnosis not present

## 2021-12-26 DIAGNOSIS — I340 Nonrheumatic mitral (valve) insufficiency: Secondary | ICD-10-CM | POA: Diagnosis not present

## 2021-12-26 DIAGNOSIS — I4819 Other persistent atrial fibrillation: Secondary | ICD-10-CM | POA: Diagnosis present

## 2021-12-26 DIAGNOSIS — Z8673 Personal history of transient ischemic attack (TIA), and cerebral infarction without residual deficits: Secondary | ICD-10-CM | POA: Diagnosis not present

## 2021-12-26 DIAGNOSIS — K5909 Other constipation: Secondary | ICD-10-CM | POA: Diagnosis present

## 2021-12-26 DIAGNOSIS — K810 Acute cholecystitis: Secondary | ICD-10-CM | POA: Diagnosis present

## 2021-12-26 DIAGNOSIS — K8062 Calculus of gallbladder and bile duct with acute cholecystitis without obstruction: Secondary | ICD-10-CM | POA: Diagnosis present

## 2021-12-26 DIAGNOSIS — E43 Unspecified severe protein-calorie malnutrition: Secondary | ICD-10-CM | POA: Diagnosis present

## 2021-12-26 DIAGNOSIS — R932 Abnormal findings on diagnostic imaging of liver and biliary tract: Secondary | ICD-10-CM | POA: Diagnosis not present

## 2021-12-26 DIAGNOSIS — K219 Gastro-esophageal reflux disease without esophagitis: Secondary | ICD-10-CM | POA: Diagnosis present

## 2021-12-26 DIAGNOSIS — I350 Nonrheumatic aortic (valve) stenosis: Secondary | ICD-10-CM | POA: Diagnosis not present

## 2021-12-26 DIAGNOSIS — E876 Hypokalemia: Secondary | ICD-10-CM | POA: Diagnosis present

## 2021-12-26 DIAGNOSIS — R1084 Generalized abdominal pain: Secondary | ICD-10-CM | POA: Diagnosis not present

## 2021-12-26 DIAGNOSIS — R627 Adult failure to thrive: Secondary | ICD-10-CM | POA: Diagnosis not present

## 2021-12-26 DIAGNOSIS — I081 Rheumatic disorders of both mitral and tricuspid valves: Secondary | ICD-10-CM | POA: Diagnosis present

## 2021-12-26 DIAGNOSIS — Z01818 Encounter for other preprocedural examination: Secondary | ICD-10-CM | POA: Diagnosis not present

## 2021-12-26 DIAGNOSIS — Z66 Do not resuscitate: Secondary | ICD-10-CM | POA: Diagnosis not present

## 2021-12-26 DIAGNOSIS — I248 Other forms of acute ischemic heart disease: Secondary | ICD-10-CM | POA: Diagnosis present

## 2021-12-26 DIAGNOSIS — Z20822 Contact with and (suspected) exposure to covid-19: Secondary | ICD-10-CM | POA: Diagnosis present

## 2021-12-26 DIAGNOSIS — Z951 Presence of aortocoronary bypass graft: Secondary | ICD-10-CM | POA: Diagnosis not present

## 2021-12-26 DIAGNOSIS — J45909 Unspecified asthma, uncomplicated: Secondary | ICD-10-CM | POA: Diagnosis present

## 2021-12-26 DIAGNOSIS — E785 Hyperlipidemia, unspecified: Secondary | ICD-10-CM | POA: Diagnosis present

## 2021-12-26 DIAGNOSIS — I495 Sick sinus syndrome: Secondary | ICD-10-CM | POA: Diagnosis present

## 2021-12-26 DIAGNOSIS — Z515 Encounter for palliative care: Secondary | ICD-10-CM | POA: Diagnosis not present

## 2021-12-26 DIAGNOSIS — I110 Hypertensive heart disease with heart failure: Secondary | ICD-10-CM | POA: Diagnosis present

## 2021-12-26 DIAGNOSIS — Z681 Body mass index (BMI) 19 or less, adult: Secondary | ICD-10-CM | POA: Diagnosis not present

## 2021-12-26 DIAGNOSIS — R54 Age-related physical debility: Secondary | ICD-10-CM | POA: Diagnosis present

## 2021-12-26 LAB — COMPREHENSIVE METABOLIC PANEL
ALT: 37 U/L (ref 0–44)
AST: 63 U/L — ABNORMAL HIGH (ref 15–41)
Albumin: 3.4 g/dL — ABNORMAL LOW (ref 3.5–5.0)
Alkaline Phosphatase: 37 U/L — ABNORMAL LOW (ref 38–126)
Anion gap: 7 (ref 5–15)
BUN: 10 mg/dL (ref 8–23)
CO2: 23 mmol/L (ref 22–32)
Calcium: 8.6 mg/dL — ABNORMAL LOW (ref 8.9–10.3)
Chloride: 108 mmol/L (ref 98–111)
Creatinine, Ser: 0.51 mg/dL (ref 0.44–1.00)
GFR, Estimated: >60 mL/min (ref >60)
Glucose, Bld: 110 mg/dL — ABNORMAL HIGH (ref 70–99)
Potassium: 3.5 mmol/L (ref 3.5–5.1)
Sodium: 138 mmol/L (ref 135–145)
Total Bilirubin: 0.9 mg/dL (ref 0.3–1.2)
Total Protein: 5.7 g/dL — ABNORMAL LOW (ref 6.5–8.1)

## 2021-12-26 LAB — URINALYSIS, ROUTINE W REFLEX MICROSCOPIC
Bilirubin Urine: NEGATIVE
Glucose, UA: NEGATIVE mg/dL
Ketones, ur: NEGATIVE mg/dL
Leukocytes,Ua: NEGATIVE
Nitrite: NEGATIVE
Protein, ur: 30 mg/dL — AB
Specific Gravity, Urine: 1.038 — ABNORMAL HIGH (ref 1.005–1.030)
pH: 5 (ref 5.0–8.0)

## 2021-12-26 LAB — BRAIN NATRIURETIC PEPTIDE: B Natriuretic Peptide: 291.9 pg/mL — ABNORMAL HIGH (ref 0.0–100.0)

## 2021-12-26 LAB — GLUCOSE, CAPILLARY: Glucose-Capillary: 91 mg/dL (ref 70–99)

## 2021-12-26 LAB — LIPASE, BLOOD: Lipase: 1593 U/L — ABNORMAL HIGH (ref 11–51)

## 2021-12-26 LAB — TROPONIN I (HIGH SENSITIVITY)
Troponin I (High Sensitivity): 23 ng/L — ABNORMAL HIGH (ref <18)
Troponin I (High Sensitivity): 54 ng/L — ABNORMAL HIGH (ref <18)
Troponin I (High Sensitivity): 53 ng/L — ABNORMAL HIGH (ref <18)

## 2021-12-26 LAB — CBC
HCT: 37.8 % (ref 36.0–46.0)
Hemoglobin: 12.2 g/dL (ref 12.0–15.0)
MCH: 29.9 pg (ref 26.0–34.0)
MCHC: 32.3 g/dL (ref 30.0–36.0)
MCV: 92.6 fL (ref 80.0–100.0)
Platelets: 150 10*3/uL (ref 150–400)
RBC: 4.08 MIL/uL (ref 3.87–5.11)
RDW: 14.8 % (ref 11.5–15.5)
WBC: 13.4 10*3/uL — ABNORMAL HIGH (ref 4.0–10.5)
nRBC: 0 % (ref 0.0–0.2)

## 2021-12-26 MED ORDER — ONDANSETRON HCL 4 MG PO TABS: 4.0000 mg | ORAL_TABLET | Freq: Four times a day (QID) | ORAL | Status: DC | PRN

## 2021-12-26 MED ORDER — IOHEXOL 300 MG/ML  SOLN
100.0000 mL | Freq: Once | INTRAMUSCULAR | Status: AC | PRN
  Administered 2021-12-26: 100 mL via INTRAVENOUS

## 2021-12-26 MED ORDER — LACTATED RINGERS IV SOLN: INTRAVENOUS | Status: DC

## 2021-12-26 MED ORDER — ALBUTEROL SULFATE (2.5 MG/3ML) 0.083% IN NEBU: 2.5000 mg | INHALATION_SOLUTION | RESPIRATORY_TRACT | Status: DC | PRN

## 2021-12-26 MED ORDER — FENTANYL CITRATE PF 50 MCG/ML IJ SOSY: 12.5000 ug | PREFILLED_SYRINGE | INTRAMUSCULAR | Status: DC | PRN

## 2021-12-26 MED ORDER — OXYCODONE HCL 5 MG PO TABS: 5.0000 mg | ORAL_TABLET | ORAL | Status: DC | PRN

## 2021-12-26 MED ORDER — ACETAMINOPHEN 325 MG PO TABS: 650.0000 mg | ORAL_TABLET | Freq: Four times a day (QID) | ORAL | Status: DC | PRN

## 2021-12-26 MED ORDER — ACETAMINOPHEN 650 MG RE SUPP
650.0000 mg | Freq: Four times a day (QID) | RECTAL | Status: DC | PRN
  Administered 2021-12-26: 650 mg via RECTAL
  Filled 2021-12-26: qty 1

## 2021-12-26 MED ORDER — PIPERACILLIN-TAZOBACTAM 3.375 G IVPB 30 MIN
3.3750 g | Freq: Once | INTRAVENOUS | Status: AC
  Administered 2021-12-26: 3.375 g via INTRAVENOUS
  Filled 2021-12-26: qty 50

## 2021-12-26 MED ORDER — ONDANSETRON HCL 4 MG/2ML IJ SOLN: 4.0000 mg | Freq: Four times a day (QID) | INTRAMUSCULAR | Status: DC | PRN

## 2021-12-26 MED ORDER — PANTOPRAZOLE SODIUM 40 MG IV SOLR
40.0000 mg | INTRAVENOUS | Status: DC
  Administered 2021-12-26 – 2021-12-29 (×4): 40 mg via INTRAVENOUS
  Filled 2021-12-26 (×4): qty 10

## 2021-12-26 MED ORDER — PIPERACILLIN-TAZOBACTAM 3.375 G IVPB
3.3750 g | Freq: Three times a day (TID) | INTRAVENOUS | Status: DC
  Administered 2021-12-26 – 2021-12-29 (×10): 3.375 g via INTRAVENOUS
  Filled 2021-12-26 (×10): qty 50

## 2021-12-26 NOTE — Consult Note (Addendum)
CC/Reason for consult: Biliary pancreatitis   HPI: [name] is an 88 y.o. female with hx of HTN, HLD, CHF, GERD, afib s/p MAZE, CVA, CAD, AR/AS whom presented to hospital yesterday with abdominal pain that she says is severe. Nausea and dry heaves. Radiates to her right back. Has been present for upwards of a week per her daughter whom is at bedside and resides with her. No fever/chills. Some loose stool. Never had this before  Past Medical History:  Diagnosis Date   Anxiety    Aortic Stenosis s/p TAVR    Echo 10/21: EF 55-60, no RWMA, mild asymmetric LVH, normal RVSF, RVSP 40.2 mmHg, mild MR, s/p TAVR with mean gradient 8.28mHg, no PVL   Arthritis    OSTEO   Aspirin allergy    on Plavix   Asthma    Bronchitis    CAD (coronary artery disease)    a. s/p CABG 2006 with Cox-Maze procedure.   Carotid artery disease ([facility]    a. s/p R CEA.   Chronic stable angina (HCC)    Diastolic dysfunction    Eczema    Fibromyalgia    GERD (gastroesophageal reflux disease)    Heart murmur    Hiatal hernia    Hyperlipidemia    Hypertension    Kyphoscoliosis    Mitral regurgitation    Myocardial infarction ([facility] 2003   Stent to CFX   [place]oxysmal [place]) [place]    a. pt has h/o hematuria on Eliquis and has since refused anticoagulation   Pneumonia 2015 ?   Pulmonary regurgitation    Skin cancer of arm    Stroke ([facility]    Tricuspid regurgitation     Past Surgical History:  Procedure Laterality Date   ABDOMINAL HYSTERECTOMY  1976   CAROTID ENDARTERECTOMY  2010   CORONARY ANGIOPLASTY WITH STENT PLACEMENT     CORONARY ARTERY BYPASS GRAFT  01/2005   LIMA-D1; SVG-LAD; SVG-OM; SVG-PDA   ESOPHAGOGASTRODUODENOSCOPY (EGD) WITH PROPOFOL N/A 03/19/2021   Procedure: ESOPHAGOGASTRODUODENOSCOPY (EGD) WITH PROPOFOL;  Surgeon: HCarol Ada MD;  [facility];  Service: Endoscopy;  Laterality: N/A;   EYE SURGERY     MULTIPLE EXTRACTIONS WITH ALVEOLOPLASTY  12/28/2016    Extraction of tooth #'s 2- 5,7-10, 12,13,17-20,and 22-29 with alveoloplasty and maxillary right and left lateral exostoses reductions   MULTIPLE EXTRACTIONS WITH ALVEOLOPLASTY N/A 12/28/2016   Procedure: Extraction of tooth #'s 2- 5,7-10, 12,13,17-20,and 22-29 with alveoloplasty and maxillary right and left lateral exostoses reductions;  Surgeon: KLenn Cal DDS;  [place] [facility] [place]  Service: Oral Surgery;  Laterality: N/A;   PACEMAKER IMPLANT N/A 01/15/2021   Procedure: PACEMAKER IMPLANT;  Surgeon: CConstance Haw MD;  [facility];  Service: Cardiovascular;  Laterality: N/A;   RIGHT/LEFT HEART CATH AND CORONARY/GRAFT ANGIOGRAPHY N/A 10/27/2016   Procedure: Right/Left Heart Cath and Coronary/Graft Angiography;  Surgeon: MBurnell Blanks MD;  [facility];  Service: Cardiovascular;  Laterality: N/A;   SAVORY DILATION N/A 03/19/2021   Procedure: SAVORY DILATION;  Surgeon: HCarol Ada MD;  [facility];  Service: Endoscopy;  Laterality: N/A;   TEE WITHOUT CARDIOVERSION N/A 01/03/2017   Procedure: TRANSESOPHAGEAL ECHOCARDIOGRAM (TEE);  Surgeon: MBurnell Blanks MD;  [place] [facility]  Service: Open Heart Surgery;  Laterality: N/A;   TONSILLECTOMY     TRANSCATHETER AORTIC VALVE REPLACEMENT, TRANSFEMORAL N/A 01/03/2017   Procedure: TRANSCATHETER AORTIC VALVE REPLACEMENT, TRANSFEMORAL;  Surgeon: MBurnell Blanks MD;  Location:  [place] [facility];  Service: Open Heart Surgery;  Laterality: N/A;   TUMOR REMOVAL      Family History  Problem Relation Age of Onset   Heart failure Mother    Other Father     Social:  reports that she has never smoked. She quit smokeless tobacco use about 20 years ago.  Her smokeless tobacco use included snuff. She reports that she does not drink alcohol and does not use drugs.  Allergies:  Allergies  Allergen Reactions   Other Other (See Comments)    NO ACIDIC, TART< OR SPICY FOODS- DEVELOPS REFLUX  OFTEN!!  PATIENT HAS TROUBLE SWALLOWING TABLETS!!   Bactrim [Sulfamethoxazole-Trimethoprim] Other (See Comments)    "makes her feel funny" or "unsteady"    Amitriptyline Hcl Rash   Aspirin Hives, Swelling, Rash and Other (See Comments)    Body became swollen   Tape Other (See Comments)    SKIN IS VERY THIN- WILL TEAR EASILY!!   Zetia [Ezetimibe] Rash    Medications: I have reviewed the patient's current medications.  Results for orders placed or performed during the hospital encounter of 12/25/21 (from the past 48 hour(s))  CBC with Differential     Status: Abnormal   Collection Time: 12/25/21 10:02 PM  Result Value Ref Range   WBC 12.3 (H) 4.0 - 10.5 K/uL   RBC 4.17 3.87 - 5.11 MIL/uL   Hemoglobin 12.7 12.0 - 15.0 g/dL   HCT 39.2 36.0 - 46.0 %   MCV 94.0 80.0 - 100.0 fL   MCH 30.5 26.0 - 34.0 pg   MCHC 32.4 30.0 - 36.0 g/dL   RDW 14.6 11.5 - 15.5 %   Platelets 152 150 - 400 K/uL   nRBC 0.0 0.0 - 0.2 %   Neutrophils Relative % 92 %   Neutro Abs 11.2 (H) 1.7 - 7.7 K/uL   Lymphocytes Relative 5 %   Lymphs Abs 0.6 (L) 0.7 - 4.0 K/uL   Monocytes Relative 3 %   Monocytes Absolute 0.4 0.1 - 1.0 K/uL   Eosinophils Relative 0 %   Eosinophils Absolute 0.0 0.0 - 0.5 K/uL   Basophils Relative 0 %   Basophils Absolute 0.0 0.0 - 0.1 K/uL   Immature Granulocytes 0 %   Abs Immature Granulocytes 0.05 0.00 - 0.07 K/uL    Comment: Performed at [facility], [address], [place], [place] [address]  Comprehensive metabolic panel     Status: Abnormal   Collection Time: 12/25/21 10:02 PM  Result Value Ref Range   Sodium 140 135 - 145 mmol/L   Potassium 3.4 (L) 3.5 - 5.1 mmol/L   Chloride 106 98 - 111 mmol/L   CO2 27 22 - 32 mmol/L   Glucose, Bld 121 (H) 70 - 99 mg/dL    Comment: Glucose reference range applies only to samples taken after fasting for at least 8 hours.   BUN 11 8 - 23 mg/dL   Creatinine, Ser 0.54 0.44 - 1.00 mg/dL   Calcium 9.1 8.9 - 10.3 mg/dL   Total Protein  6.6 6.5 - 8.1 g/dL   Albumin 3.9 3.5 - 5.0 g/dL   AST 40 15 - 41 U/L   ALT 18 0 - 44 U/L   Alkaline Phosphatase 41 38 - 126 U/L   Total Bilirubin 1.0 0.3 - 1.2 mg/dL   GFR, Estimated >60 >60 mL/min    Comment: (NOTE) Calculated using the CKD-EPI Creatinine Equation (2021)    Anion gap 7 5 -  15    Comment: Performed at [facility], [place] [address] [place], [place] [address]  Troponin I (High Sensitivity)     Status: Abnormal   Collection Time: 12/25/21 10:02 PM  Result Value Ref Range   Troponin I (High Sensitivity) 19 (H) <18 ng/L    Comment: (NOTE) Elevated high sensitivity troponin I (hsTnI) values and significant  changes across serial measurements may suggest ACS but many other  chronic and acute conditions are known to elevate hsTnI results.  Refer to the "Links" section for chest pain algorithms and additional  guidance. Performed at [facility], [place] [address], [place], [place] [address]   Lipase, blood     Status: Abnormal   Collection Time: 12/25/21 10:02 PM  Result Value Ref Range   Lipase 3,580 (H) 11 - 51 U/L    Comment: RESULTS CONFIRMED BY MANUAL DILUTION Performed at [facility], [place] [address] [place], [place] [address]   I-stat chem 8, ED (not at [facility] or [facility])     Status: Abnormal   Collection Time: 12/25/21 10:14 PM  Result Value Ref Range   Sodium 140 135 - 145 mmol/L   Potassium 3.3 (L) 3.5 - 5.1 mmol/L   Chloride 103 98 - 111 mmol/L   BUN 12 8 - 23 mg/dL   Creatinine, Ser 0.30 (L) 0.44 - 1.00 mg/dL   Glucose, Bld 112 (H) 70 - 99 mg/dL    Comment: Glucose reference range applies only to samples taken after fasting for at least 8 hours.   Calcium, Ion 1.20 1.15 - 1.40 mmol/L   TCO2 25 22 - 32 mmol/L   Hemoglobin 13.3 12.0 - 15.0 g/dL   HCT 39.0 36.0 - 46.0 %  Troponin I (High Sensitivity)     Status: Abnormal   Collection Time: 12/26/21 12:40 AM  Result Value Ref Range   Troponin I (High Sensitivity) 23 (H) <18 ng/L    Comment:  (NOTE) Elevated high sensitivity troponin I (hsTnI) values and significant  changes across serial measurements may suggest ACS but many other  chronic and acute conditions are known to elevate hsTnI results.  Refer to the "Links" section for chest pain algorithms and additional  guidance. Performed at [facility], [place] [address], [place], [place] [address]   Brain natriuretic peptide     Status: Abnormal   Collection Time: 12/26/21  4:43 AM  Result Value Ref Range   B Natriuretic Peptide 291.9 (H) 0.0 - 100.0 pg/mL    Comment: Performed at [place] [address], [place], [place] [address]  Troponin I (High Sensitivity)     Status: Abnormal   Collection Time: 12/26/21  4:43 AM  Result Value Ref Range   Troponin I (High Sensitivity) 54 (H) <18 ng/L    Comment: DELTA CHECK NOTED RESULT CALLED TO, READ BACK BY AND VERIFIED WITH Alyson Locket, RN, [phone], 12/26/21, ADEDOKUNE (NOTE) Elevated high sensitivity troponin I (hsTnI) values and significant  changes across serial measurements may suggest ACS but many other  chronic and acute conditions are known to elevate hsTnI results.  Refer to the "Links" section for chest pain algorithms and additional  guidance. Performed at [facility], [place] [address] [place], [place] [address]     US Abdomen Limited RUQ (LIVER/GB)  Result Date: 12/26/2021 CLINICAL DATA:  Epigastric abdominal pain. EXAM: ULTRASOUND ABDOMEN LIMITED RIGHT UPPER QUADRANT COMPARISON:  Abdominopelvic CT earlier today demonstrating choledocholithiasis. FINDINGS:  Gallbladder: The gallbladder is prominently distended. The gallstones on prior CT are not well demonstrated on the current exam. Gallbladder wall thickening at 5 mm. Positive sonographic Murphy sign noted by sonographer. Common bile duct: Diameter: 13 mm. The common bile duct stones on CT are not well seen on the current exam. Liver: Mild central intrahepatic biliary ductal dilatation. No focal liver  lesion. Portal vein is patent on color Doppler imaging with normal direction of blood flow towards the liver. Other: Small amount of right upper quadrant free fluid. IMPRESSION: 1. Distended gallbladder with wall thickening and positive sonographic Murphy sign. The gallstones on CT earlier today are not well seen on the current exam. 2. Biliary dilatation, choledocholithiasis on CT not well demonstrated on the current exam. Electronically Signed   By: Keith Rake M.D.   On: 12/26/2021 02:18   CT ABDOMEN PELVIS W CONTRAST  Result Date: 12/26/2021 CLINICAL DATA:  Abdominal pain and vomiting. EXAM: CT ABDOMEN AND PELVIS WITH CONTRAST TECHNIQUE: Multidetector CT imaging of the abdomen and pelvis was performed using the standard protocol following bolus administration of intravenous contrast. RADIATION DOSE REDUCTION: This exam was performed according to the departmental dose-optimization program which includes automated exposure control, adjustment of the mA and/or kV according to patient size and/or use of iterative reconstruction technique. CONTRAST:  142m OMNIPAQUE IOHEXOL 300 MG/ML  SOLN COMPARISON:  01/16/2021. FINDINGS: Lower chest: The heart is enlarged and pacemaker leads are noted in the heart. There are small bilateral pleural effusions. Hepatobiliary: No focal liver abnormality. Intrahepatic and extrahepatic biliary ductal dilatation is noted. The common bile duct measures 1 cm. Multiple stones are present in the distal common bile duct measuring up to 6 mm. Stones are present in the gallbladder. Pancreas: Unremarkable. No pancreatic ductal dilatation or surrounding inflammatory changes. Spleen: The spleen is normal in size. Scattered hypodensities are noted, possibly representing cysts or hemangiomas. Adrenals/Urinary Tract: No adrenal nodule or mass. The kidneys enhance symmetrically. Renal cortical scarring is noted bilaterally. A stable exophytic cyst is noted in the upper pole the right kidney.  No renal calculus or hydronephrosis. The bladder is unremarkable. Stomach/Bowel: No bowel obstruction, free air, or pneumatosis. Gastric wall thickening is noted. Scattered diverticula are present along the colon without evidence of diverticulitis. The appendix is unremarkable. Mild bowel wall thickening is noted in the jejunum in the pelvis. Vascular/Lymphatic: Aortic atherosclerosis. No enlarged abdominal or pelvic lymph nodes. Reproductive: Status post hysterectomy. No adnexal masses. Other: Mesenteric edema is noted with free fluid at the gallbladder fossa, perihepatic space, and pelvis. Musculoskeletal: Sternotomy wires are present over the midline. Degenerative changes are noted in the thoracolumbar spine. No acute osseous abnormality. IMPRESSION: 1. Increased intrahepatic and extrahepatic biliary ductal dilatation. There is increased distention of the common bile duct measuring 1 cm with choledocholithiasis. Cholelithiasis is also noted. Free fluid is noted at the gallbladder fossa and perihepatic space posteriorly. Correlate clinically to exclude superimposed acute cholecystitis. 2. Jejunal bowel wall thickening in the pelvis, possible infectious or inflammatory enteritis. 3. Diffuse mesenteric edema and a small amount of free fluid in the pelvis. 4. Small bilateral pleural effusions. 5. Gastric wall thickening, possible gastritis. 6. Aortic atherosclerosis. 7. Remaining ancillary findings as detailed above. Electronically Signed   By: LBrett FairyM.D.   On: 12/26/2021 00:54   DG Chest Port 1 View  Result Date: 12/25/2021 CLINICAL DATA:  Shortness of breath. EXAM: PORTABLE CHEST 1 VIEW COMPARISON:  01/16/2021. FINDINGS: The heart size and mediastinal contours are stable. There  is atherosclerotic calcification of the aorta. A dual lead pacemaker is present over the left chest. There is blunting of the right costophrenic angle which is unchanged from the prior exam and may represent atelectasis or  scarring. The left lung is clear. No pneumothorax. There is evidence of prior cardiothoracic surgery with TAVR stent. IMPRESSION: 1. Blunting of the right costophrenic angle, possibly representing atelectasis or scarring is compared with previous exams. 2. Otherwise stable chest. Electronically Signed   By: Brett Fairy M.D.   On: 12/25/2021 23:40    ROS - all of the below systems have been reviewed with the patient and positives are indicated with bold text General: chills, fever or night sweats Eyes: blurry vision or double vision ENT: epistaxis or sore throat Allergy/Immunology: itchy/watery eyes or nasal congestion Hematologic/Lymphatic: bleeding problems, blood clots or swollen lymph nodes Endocrine: temperature intolerance or unexpected weight changes Breast: new or changing breast lumps or nipple discharge Resp: cough, shortness of breath, or wheezing CV: chest pain or dyspnea on exertion GI: as per HPI GU: dysuria, trouble voiding, or hematuria MSK: joint pain or joint stiffness Neuro: TIA or stroke symptoms Derm: pruritus and skin lesion changes Psych: anxiety and depression  PE Blood pressure 119/66, pulse 70, temperature 98.8 F (37.1 C), temperature source Oral, resp. rate (!) 25, weight 41 kg, SpO2 94 %. Constitutional: NAD; conversant; no deformities Eyes: Moist conjunctiva; no lid lag; anicteric; PERRL Neck: Trachea midline; no thyromegaly Lungs: Normal respiratory effort; no tactile fremitus CV: RRR; no palpable thrills; no pitting edema GI: Abd soft, mildly ttp in MEG and RUQ; no palpable hepatosplenomegaly; nondistended MSK: Normal range of motion of extremities; no clubbing/cyanosis Psychiatric: Appropriate affect; alert and oriented x3 Lymphatic: No palpable cervical or axillary lymphadenopathy  Results for orders placed or performed during the hospital encounter of 12/25/21 (from the past 48 hour(s))  CBC with Differential     Status: Abnormal   Collection  Time: 12/25/21 10:02 PM  Result Value Ref Range   WBC 12.3 (H) 4.0 - 10.5 K/uL   RBC 4.17 3.87 - 5.11 MIL/uL   Hemoglobin 12.7 12.0 - 15.0 g/dL   HCT 39.2 36.0 - 46.0 %   MCV 94.0 80.0 - 100.0 fL   MCH 30.5 26.0 - 34.0 pg   MCHC 32.4 30.0 - 36.0 g/dL   RDW 14.6 11.5 - 15.5 %   Platelets 152 150 - 400 K/uL   nRBC 0.0 0.0 - 0.2 %   Neutrophils Relative % 92 %   Neutro Abs 11.2 (H) 1.7 - 7.7 K/uL   Lymphocytes Relative 5 %   Lymphs Abs 0.6 (L) 0.7 - 4.0 K/uL   Monocytes Relative 3 %   Monocytes Absolute 0.4 0.1 - 1.0 K/uL   Eosinophils Relative 0 %   Eosinophils Absolute 0.0 0.0 - 0.5 K/uL   Basophils Relative 0 %   Basophils Absolute 0.0 0.0 - 0.1 K/uL   Immature Granulocytes 0 %   Abs Immature Granulocytes 0.05 0.00 - 0.07 K/uL    Comment: Performed at [facility], [address], [place], [place] [address]  Comprehensive metabolic panel     Status: Abnormal   Collection Time: 12/25/21 10:02 PM  Result Value Ref Range   Sodium 140 135 - 145 mmol/L   Potassium 3.4 (L) 3.5 - 5.1 mmol/L   Chloride 106 98 - 111 mmol/L   CO2 27 22 - 32 mmol/L   Glucose, Bld 121 (H) 70 - 99 mg/dL  Comment: Glucose reference range applies only to samples taken after fasting for at least 8 hours.   BUN 11 8 - 23 mg/dL   Creatinine, Ser 0.54 0.44 - 1.00 mg/dL   Calcium 9.1 8.9 - 10.3 mg/dL   Total Protein 6.6 6.5 - 8.1 g/dL   Albumin 3.9 3.5 - 5.0 g/dL   AST 40 15 - 41 U/L   ALT 18 0 - 44 U/L   Alkaline Phosphatase 41 38 - 126 U/L   Total Bilirubin 1.0 0.3 - 1.2 mg/dL   GFR, Estimated >60 >60 mL/min    Comment: (NOTE) Calculated using the CKD-EPI Creatinine Equation (2021)    Anion gap 7 5 - 15    Comment: Performed at [facility] [address], [place], [place] [address]  Troponin I (High Sensitivity)     Status: Abnormal   Collection Time: 12/25/21 10:02 PM  Result Value Ref Range   Troponin I (High Sensitivity) 19 (H) <18 ng/L    Comment: (NOTE) Elevated high  sensitivity troponin I (hsTnI) values and significant  changes across serial measurements may suggest ACS but many other  chronic and acute conditions are known to elevate hsTnI results.  Refer to the "Links" section for chest pain algorithms and additional  guidance. Performed at [facility], [place] [address] [place], [place] [address]   Lipase, blood     Status: Abnormal   Collection Time: 12/25/21 10:02 PM  Result Value Ref Range   Lipase 3,580 (H) 11 - 51 U/L    Comment: RESULTS CONFIRMED BY MANUAL DILUTION Performed at [facility], [place] [address] [place], [place] [address]   I-stat chem 8, ED (not at [facility] or [facility])     Status: Abnormal   Collection Time: 12/25/21 10:14 PM  Result Value Ref Range   Sodium 140 135 - 145 mmol/L   Potassium 3.3 (L) 3.5 - 5.1 mmol/L   Chloride 103 98 - 111 mmol/L   BUN 12 8 - 23 mg/dL   Creatinine, Ser 0.30 (L) 0.44 - 1.00 mg/dL   Glucose, Bld 112 (H) 70 - 99 mg/dL    Comment: Glucose reference range applies only to samples taken after fasting for at least 8 hours.   Calcium, Ion 1.20 1.15 - 1.40 mmol/L   TCO2 25 22 - 32 mmol/L   Hemoglobin 13.3 12.0 - 15.0 g/dL   HCT 39.0 36.0 - 46.0 %  Troponin I (High Sensitivity)     Status: Abnormal   Collection Time: 12/26/21 12:40 AM  Result Value Ref Range   Troponin I (High Sensitivity) 23 (H) <18 ng/L    Comment: (NOTE) Elevated high sensitivity troponin I (hsTnI) values and significant  changes across serial measurements may suggest ACS but many other  chronic and acute conditions are known to elevate hsTnI results.  Refer to the "Links" section for chest pain algorithms and additional  guidance. Performed at [facility], [place] [address] [place], [place] [address]   Brain natriuretic peptide     Status: Abnormal   Collection Time: 12/26/21  4:43 AM  Result Value Ref Range   B Natriuretic Peptide 291.9 (H) 0.0 - 100.0 pg/mL    Comment: Performed at [facility] [address], [place], [place] [address]  Troponin I (High Sensitivity)     Status: Abnormal   Collection Time: 12/26/21  4:43 AM  Result Value Ref Range   Troponin I (High Sensitivity)  54 (H) <18 ng/L    Comment: DELTA CHECK NOTED RESULT CALLED TO, READ BACK BY AND VERIFIED WITH Alyson Locket, RN, [phone], 12/26/21, ADEDOKUNE (NOTE) Elevated high sensitivity troponin I (hsTnI) values and significant  changes across serial measurements may suggest ACS but many other  chronic and acute conditions are known to elevate hsTnI results.  Refer to the "Links" section for chest pain algorithms and additional  guidance. Performed at [facility], [place] [address], [place], [place] [address]     US Abdomen Limited RUQ (LIVER/GB)  Result Date: 12/26/2021 CLINICAL DATA:  Epigastric abdominal pain. EXAM: ULTRASOUND ABDOMEN LIMITED RIGHT UPPER QUADRANT COMPARISON:  Abdominopelvic CT earlier today demonstrating choledocholithiasis. FINDINGS: Gallbladder: The gallbladder is prominently distended. The gallstones on prior CT are not well demonstrated on the current exam. Gallbladder wall thickening at 5 mm. Positive sonographic Murphy sign noted by sonographer. Common bile duct: Diameter: 13 mm. The common bile duct stones on CT are not well seen on the current exam. Liver: Mild central intrahepatic biliary ductal dilatation. No focal liver lesion. Portal vein is patent on color Doppler imaging with normal direction of blood flow towards the liver. Other: Small amount of right upper quadrant free fluid. IMPRESSION: 1. Distended gallbladder with wall thickening and positive sonographic Murphy sign. The gallstones on CT earlier today are not well seen on the current exam. 2. Biliary dilatation, choledocholithiasis on CT not well demonstrated on the current exam. Electronically Signed   By: Keith Rake M.D.   On: 12/26/2021 02:18   CT ABDOMEN PELVIS W CONTRAST  Result Date: 12/26/2021 CLINICAL DATA:  Abdominal pain and  vomiting. EXAM: CT ABDOMEN AND PELVIS WITH CONTRAST TECHNIQUE: Multidetector CT imaging of the abdomen and pelvis was performed using the standard protocol following bolus administration of intravenous contrast. RADIATION DOSE REDUCTION: This exam was performed according to the departmental dose-optimization program which includes automated exposure control, adjustment of the mA and/or kV according to patient size and/or use of iterative reconstruction technique. CONTRAST:  168m OMNIPAQUE IOHEXOL 300 MG/ML  SOLN COMPARISON:  01/16/2021. FINDINGS: Lower chest: The heart is enlarged and pacemaker leads are noted in the heart. There are small bilateral pleural effusions. Hepatobiliary: No focal liver abnormality. Intrahepatic and extrahepatic biliary ductal dilatation is noted. The common bile duct measures 1 cm. Multiple stones are present in the distal common bile duct measuring up to 6 mm. Stones are present in the gallbladder. Pancreas: Unremarkable. No pancreatic ductal dilatation or surrounding inflammatory changes. Spleen: The spleen is normal in size. Scattered hypodensities are noted, possibly representing cysts or hemangiomas. Adrenals/Urinary Tract: No adrenal nodule or mass. The kidneys enhance symmetrically. Renal cortical scarring is noted bilaterally. A stable exophytic cyst is noted in the upper pole the right kidney. No renal calculus or hydronephrosis. The bladder is unremarkable. Stomach/Bowel: No bowel obstruction, free air, or pneumatosis. Gastric wall thickening is noted. Scattered diverticula are present along the colon without evidence of diverticulitis. The appendix is unremarkable. Mild bowel wall thickening is noted in the jejunum in the pelvis. Vascular/Lymphatic: Aortic atherosclerosis. No enlarged abdominal or pelvic lymph nodes. Reproductive: Status post hysterectomy. No adnexal masses. Other: Mesenteric edema is noted with free fluid at the gallbladder fossa, perihepatic space, and  pelvis. Musculoskeletal: Sternotomy wires are present over the midline. Degenerative changes are noted in the thoracolumbar spine. No acute osseous abnormality. IMPRESSION: 1. Increased intrahepatic and extrahepatic biliary ductal dilatation. There is increased distention of the common bile duct measuring 1 cm with choledocholithiasis.  Cholelithiasis is also noted. Free fluid is noted at the gallbladder fossa and perihepatic space posteriorly. Correlate clinically to exclude superimposed acute cholecystitis. 2. Jejunal bowel wall thickening in the pelvis, possible infectious or inflammatory enteritis. 3. Diffuse mesenteric edema and a small amount of free fluid in the pelvis. 4. Small bilateral pleural effusions. 5. Gastric wall thickening, possible gastritis. 6. Aortic atherosclerosis. 7. Remaining ancillary findings as detailed above. Electronically Signed   By: Brett Fairy M.D.   On: 12/26/2021 00:54   DG Chest Port 1 View  Result Date: 12/25/2021 CLINICAL DATA:  Shortness of breath. EXAM: PORTABLE CHEST 1 VIEW COMPARISON:  01/16/2021. FINDINGS: The heart size and mediastinal contours are stable. There is atherosclerotic calcification of the aorta. A dual lead pacemaker is present over the left chest. There is blunting of the right costophrenic angle which is unchanged from the prior exam and may represent atelectasis or scarring. The left lung is clear. No pneumothorax. There is evidence of prior cardiothoracic surgery with TAVR stent. IMPRESSION: 1. Blunting of the right costophrenic angle, possibly representing atelectasis or scarring is compared with previous exams. 2. Otherwise stable chest. Electronically Signed   By: Brett Fairy M.D.   On: 12/25/2021 23:40    I have personally reviewed the relevant CBC, CMP, lipase, RUQ [place], CT A/P  A/P: [name] is an 88 y.o. female with HTN, HLD, CHF, GERD, afib s/p MAZE, CVA, CAD, AR/AS - here with presumed biliary pancreatitis, evident  choledocholithiasis on CT, and possible although less likely cholecystitis  -Suspect wall thickening is related to peripancreatic inflammation but would cover empirically with IV abx at present  -Cards evaluation for clearance -GI consult for ercp -We will follow with you, no surgery planned today, unclear if eventual surgical candidate; will need cardiology to weigh in and rediscuss with her and her family in coming days -We will follow with you  Nadeen Landau, MD [facility], [place]

## 2021-12-26 NOTE — H&P (Addendum)
History and Physical    [name]:[id number] DOB: 11-22-1928 DOA: 12/25/2021  PCP: Lori Lung, MD  Patient coming from: home  I have personally briefly reviewed patient's old medical records in [place]  Chief Complaint: abdominal pain n/v  HPI: [name] is a 88 y.o. female with medical history significant of  CAD s/p CABG, atrial fib s/p MAZE procedure, CVA, carotid artery disease s/p right CEA, fibromyalgia, GERD, mild to moderate MR, severe AS s/p TAVR and sick sinus syndrome s/p pacemaker, diastolic dysfunction,HTN, HLD Asthma who presents with abdominal pain and nausea and dry heaving. Per daughter who give history mother started with back pain on right side around one week ago and today noted to have severe abdominal pain dry heaves as well as diarrhea. Patient currently states she feels improved s/p treatment with pain medications. She notes no fever or chills, no chest pain  or sob.  She notes he abdomen is sore but no significant pain.   ED Course:  Afeb, bp 156/71 , hr 95, rr 21  sat 98% on ra  Wbc :12.3, hgb 12.7,  plt 152, pnm 92 Na : 140, K 3.4, cr 0.54, ast 40, alt18, alkphos 41,  T bili1 Lipase 3580 XHB:ZJIRCVELFY: 1. Blunting of the right costophrenic angle, possibly representing atelectasis or scarring is compared with previous exams. 2. Otherwise stable chest.   CT abd/pelvis   IMPRESSION: 1. Increased intrahepatic and extrahepatic biliary ductal dilatation. There is increased distention of the common bile duct measuring 1 cm with choledocholithiasis. Cholelithiasis is also noted. Free fluid is noted at the gallbladder fossa and perihepatic space posteriorly. Correlate clinically to exclude superimposed acute cholecystitis. 2. Jejunal bowel wall thickening in the pelvis, possible infectious or inflammatory enteritis. 3. Diffuse mesenteric edema and a small amount of free fluid in the pelvis. 4. Small bilateral pleural effusions. 5.  Gastric wall thickening, possible gastritis. 6. Aortic atherosclerosis. Tx ns 500cc, pepcid,zosyn CE 19,23 Review of Systems: As per HPI otherwise 10 point review of systems negative.   Past Medical History:  Diagnosis Date   Anxiety    Aortic Stenosis s/p TAVR    Echo 10/21: EF 55-60, no RWMA, mild asymmetric LVH, normal RVSF, RVSP 40.2 mmHg, mild MR, s/p TAVR with mean gradient 8.60mHg, no PVL   Arthritis    OSTEO   Aspirin allergy    on Plavix   Asthma    Bronchitis    CAD (coronary artery disease)    a. s/p CABG 2006 with Cox-Maze procedure.   [place]. s/p R CEA.   Chronic stable angina (HCC)    Diastolic dysfunction    Eczema    Fibromyalgia    GERD (gastroesophageal reflux disease)    Heart murmur    Hiatal hernia    Hyperlipidemia    Hypertension    Kyphoscoliosis    Mitral regurgitation    Myocardial infarction ([facility] 2003   Stent to CFX   PA[place] ([place]oxysmal [place] fibrillation) ([place]    a. pt has h/o hematuria on Eliquis and has since refused anticoagulation   Pneumonia 2015 ?   Pulmonary regurgitation    Skin cancer of arm    Stroke ([facility]    Tricuspid regurgitation     Past Surgical History:  Procedure Laterality Date   ABDOMINAL HYSTERECTOMY  1976   CAROTID ENDARTERECTOMY  2010   CORONARY ANGIOPLASTY WITH STENT PLACEMENT     CORONARY ARTERY BYPASS GRAFT  01/2005   LIMA-D1; SVG-LAD; SVG-OM; SVG-PDA   ESOPHAGOGASTRODUODENOSCOPY (EGD) WITH PROPOFOL N/A 03/19/2021   Procedure: ESOPHAGOGASTRODUODENOSCOPY (EGD) WITH PROPOFOL;  Surgeon: Carol Ada, MD;  Location: [facility];  Service: Endoscopy;  Laterality: N/A;   EYE SURGERY     MULTIPLE EXTRACTIONS WITH ALVEOLOPLASTY  12/28/2016   Extraction of tooth #'s 2- 5,7-10, 12,13,17-20,and 22-29 with alveoloplasty and maxillary right and left lateral exostoses reductions   MULTIPLE EXTRACTIONS WITH ALVEOLOPLASTY N/A 12/28/2016   Procedure: Extraction of tooth #'s 2- 5,7-10,  12,13,17-20,and 22-29 with alveoloplasty and maxillary right and left lateral exostoses reductions;  Surgeon: Lenn Cal, DDS;  Location: [place];  Service: Oral Surgery;  Laterality: N/A;   PACEMAKER IMPLANT N/A 01/15/2021   Procedure: PACEMAKER IMPLANT;  Surgeon: Constance Haw, MD;  Location: [place] [facility];  Service: Cardiovascular;  Laterality: N/A;   RIGHT/LEFT HEART CATH AND CORONARY/GRAFT ANGIOGRAPHY N/A 10/27/2016   Procedure: Right/Left Heart Cath and Coronary/Graft Angiography;  Surgeon: Burnell Blanks, MD;  Location: [place] [facility];  Service: Cardiovascular;  Laterality: N/A;   SAVORY DILATION N/A 03/19/2021   Procedure: SAVORY DILATION;  Surgeon: Carol Ada, MD;  Location: [facility];  Service: Endoscopy;  Laterality: N/A;   TEE WITHOUT CARDIOVERSION N/A 01/03/2017   Procedure: TRANSESOPHAGEAL ECHOCARDIOGRAM (TEE);  Surgeon: Burnell Blanks, MD;  Location: [place];  Service: Open Heart Surgery;  Laterality: N/A;   TONSILLECTOMY     TRANSCATHETER AORTIC VALVE REPLACEMENT, TRANSFEMORAL N/A 01/03/2017   Procedure: TRANSCATHETER AORTIC VALVE REPLACEMENT, TRANSFEMORAL;  Surgeon: Burnell Blanks, MD;  Location: [place];  Service: Open Heart Surgery;  Laterality: N/A;   TUMOR REMOVAL       reports that she has never smoked. She quit smokeless tobacco use about 20 years ago.  Her smokeless tobacco use included snuff. She reports that she does not drink alcohol and does not use drugs.  Allergies  Allergen Reactions   Other Other (See Comments)    NO ACIDIC, TART< OR SPICY FOODS- DEVELOPS REFLUX OFTEN!!  PATIENT HAS TROUBLE SWALLOWING TABLETS!!   Bactrim [Sulfamethoxazole-Trimethoprim] Other (See Comments)    "makes her feel funny" or "unsteady"    Amitriptyline Hcl Rash   Aspirin Hives, Swelling, Rash and Other (See Comments)    Body became swollen   Tape Other (See Comments)    SKIN IS VERY THIN- WILL TEAR EASILY!!   Zetia [Ezetimibe] Rash     Family History  Problem Relation Age of Onset   Heart failure Mother    Other Father     Prior to Admission medications   Medication Sig Start Date End Date Taking? Authorizing Provider  ondansetron (ZOFRAN-ODT) 4 MG disintegrating tablet '4mg'$  ODT q4 hours prn nausea/vomit 12/25/21  Yes Drenda Freeze, MD  acetaminophen (TYLENOL) 650 MG CR tablet Take 1,300 mg by mouth 2 (two) times daily as needed for pain.     [provider]  apixaban (ELIQUIS) 2.5 MG TABS tablet TAKE 1 TABLET(2.5 MG) BY MOUTH TWICE DAILY 08/16/21   Burnell Blanks, MD  Cholecalciferol (VITAMIN D-3) 25 MCG (1000 UT) CAPS Take 1,000 Units by mouth daily.    [provider]  Ensure (ENSURE) Take 237 mLs by mouth daily.    [provider]  fluticasone (FLONASE) 50 MCG/ACT nasal spray SHAKE LIQUID AND USE 2 SPRAYS IN EACH NOSTRIL EVERY DAY 02/10/21   Medina-Vargas, Monina C, NP  fluticasone (FLOVENT HFA) 220 MCG/ACT inhaler INHALE 2 PUFFS INTO THE LUNGS TWICE DAILY  07/14/21   Lori Lung, MD  guaiFENesin (MUCINEX) 600 MG 12 hr tablet Take 1 tablet (600 mg total) by mouth 2 (two) times daily. 05/25/14   Regalado, Belkys A, MD  LINZESS 72 MCG capsule Take 1 capsule (72 mcg total) by mouth daily. 02/10/21   Medina-Vargas, Monina C, NP  loratadine (CLARITIN) 10 MG tablet Take 1 tablet (10 mg total) by mouth daily. 02/10/21   Medina-Vargas, Monina C, NP  nitroGLYCERIN (NITROSTAT) 0.4 MG SL tablet DISSOLVE 1 TABLET UNDER THE TONGUE EVERY 5 MINUTES AS NEEDED FOR CHEST PAIN 11/01/21   Burnell Blanks, MD  pantoprazole (PROTONIX) 40 MG tablet TAKE 1 TABLET BY MOUTH TWICE DAILY 07/08/21   Lori Lung, MD  polyethylene glycol powder (MIRALAX) 17 GM/SCOOP powder Take 17 g by mouth 2 (two) times daily as needed for moderate constipation. Patient not taking: Reported on 10/07/2021 02/10/21   Medina-Vargas, Monina C, NP  rosuvastatin (CRESTOR) 40 MG tablet TAKE 1 TABLET(40 MG) BY MOUTH DAILY  12/07/21   Lori Lung, MD  VENTOLIN HFA 108 [phone] Base) MCG/ACT inhaler INHALE 2 PUFFS INTO THE LUNGS EVERY 6 HOURS AS NEEDED FOR WHEEZING OR SHORTNESS OF BREATH 07/15/21   Lori Lung, MD  Wheat Dextrin (BENEFIBER PO) Take 1 Scoop by mouth daily as needed (fiber).    [provider]    Physical Exam: Vitals:   12/26/21 0200 12/26/21 0230 12/26/21 0300 12/26/21 0311  BP: (!) 100/44 (!) 130/45 (!) 126/46   Pulse: 69 68 70   Resp:   20   Temp:    99.3 F (37.4 C)  TempSrc:    Oral  SpO2: 96% 98% 97%      Vitals:   12/26/21 0200 12/26/21 0230 12/26/21 0300 12/26/21 0311  BP: (!) 100/44 (!) 130/45 (!) 126/46   Pulse: 69 68 70   Resp:   20   Temp:    99.3 F (37.4 C)  TempSrc:    Oral  SpO2: 96% 98% 97%   Constitutional: NAD, calm, comfortable Eyes: PERRL, lids and conjunctivae normal ENMT: Mucous membranes are dry Posterior pharynx clear of any exudate or lesions.Normal dentition.  Neck: normal, supple, no masses, no thyromegaly Respiratory: clear to auscultation bilaterally, no wheezing, no crackles. Normal respiratory effort. No accessory muscle use.  Cardiovascular: Regular rate and rhythm, no murmurs / rubs / gallops. No extremity edema. 2+ pedal pulses. No carotid bruits.  Abdomen:flat  not distended  mild epigastric right upper tenderness, no masses palpated. No hepatosplenomegaly. Bowel sounds positive.  Musculoskeletal: no clubbing / cyanosis. No joint deformity upper and lower extremities. Good ROM, no contractures. Normal muscle tone.  Skin: no rashes, lesions, ulcers. No induration Neurologic: CN 2-12 grossly intact. Sensation intact, Strength 5/5 in all 4.  Psychiatric: Normal judgment and insight. Alert and oriented . Normal mood.    Labs on Admission: I have personally reviewed following labs and imaging studies  CBC: Recent Labs  Lab 12/25/21 2202 12/25/21 2214  WBC 12.3*  --   NEUTROABS 11.2*  --   HGB 12.7 13.3  HCT 39.2 39.0  MCV 94.0  --    PLT 152  --    Basic Metabolic Panel: Recent Labs  Lab 12/25/21 2202 12/25/21 2214  NA 140 140  K 3.4* 3.3*  CL 106 103  CO2 27  --   GLUCOSE 121* 112*  BUN 11 12  CREATININE 0.54 0.30*  CALCIUM 9.1  --    GFR: CrCl  cannot be calculated (Unknown ideal weight.). Liver Function Tests: Recent Labs  Lab 12/25/21 2202  AST 40  ALT 18  ALKPHOS 41  BILITOT 1.0  PROT 6.6  ALBUMIN 3.9   Recent Labs  Lab 12/25/21 2202  LIPASE 3,580*   No results for input(s): "AMMONIA" in the last 168 hours. Coagulation Profile: No results for input(s): "INR", "PROTIME" in the last 168 hours. Cardiac Enzymes: No results for input(s): "CKTOTAL", "CKMB", "CKMBINDEX", "TROPONINI" in the last 168 hours. BNP (last 3 results) No results for input(s): "PROBNP" in the last 8760 hours. HbA1C: No results for input(s): "HGBA1C" in the last 72 hours. CBG: No results for input(s): "GLUCAP" in the last 168 hours. Lipid Profile: No results for input(s): "CHOL", "HDL", "LDLCALC", "TRIG", "CHOLHDL", "LDLDIRECT" in the last 72 hours. Thyroid Function Tests: No results for input(s): "TSH", "T4TOTAL", "FREET4", "T3FREE", "THYROIDAB" in the last 72 hours. Anemia Panel: No results for input(s): "VITAMINB12", "FOLATE", "FERRITIN", "TIBC", "IRON", "RETICCTPCT" in the last 72 hours. Urine analysis:    Component Value Date/Time   COLORURINE STRAW (A) 12/26/2016 1425   APPEARANCEUR HAZY (A) 12/26/2016 1425   LABSPEC 1.010 01/08/2019 1526   PHURINE 5.0 12/26/2016 1425   GLUCOSEU NEGATIVE 12/26/2016 1425   HGBUR NEGATIVE 12/26/2016 1425   BILIRUBINUR negative 01/08/2019 1526   BILIRUBINUR n 12/05/2011 1627   KETONESUR negative 01/08/2019 1526   KETONESUR NEGATIVE 12/26/2016 1425   PROTEINUR negative 01/08/2019 1526   PROTEINUR NEGATIVE 12/26/2016 1425   UROBILINOGEN 0.2 05/19/2014 1601   NITRITE Negative 01/08/2019 1526   NITRITE NEGATIVE 12/26/2016 1425   LEUKOCYTESUR Negative 01/08/2019 1526     Radiological Exams on Admission: US Abdomen Limited RUQ (LIVER/GB)  Result Date: 12/26/2021 CLINICAL DATA:  Epigastric abdominal pain. EXAM: ULTRASOUND ABDOMEN LIMITED RIGHT UPPER QUADRANT COMPARISON:  Abdominopelvic CT earlier today demonstrating choledocholithiasis. FINDINGS: Gallbladder: The gallbladder is prominently distended. The gallstones on prior CT are not well demonstrated on the current exam. Gallbladder wall thickening at 5 mm. Positive sonographic Murphy sign noted by sonographer. Common bile duct: Diameter: 13 mm. The common bile duct stones on CT are not well seen on the current exam. Liver: Mild central intrahepatic biliary ductal dilatation. No focal liver lesion. Portal vein is patent on color Doppler imaging with normal direction of blood flow towards the liver. Other: Small amount of right upper quadrant free fluid. IMPRESSION: 1. Distended gallbladder with wall thickening and positive sonographic Murphy sign. The gallstones on CT earlier today are not well seen on the current exam. 2. Biliary dilatation, choledocholithiasis on CT not well demonstrated on the current exam. Electronically Signed   By: Keith Rake M.D.   On: 12/26/2021 02:18   CT ABDOMEN PELVIS W CONTRAST  Result Date: 12/26/2021 CLINICAL DATA:  Abdominal pain and vomiting. EXAM: CT ABDOMEN AND PELVIS WITH CONTRAST TECHNIQUE: Multidetector CT imaging of the abdomen and pelvis was performed using the standard protocol following bolus administration of intravenous contrast. RADIATION DOSE REDUCTION: This exam was performed according to the departmental dose-optimization program which includes automated exposure control, adjustment of the mA and/or kV according to patient size and/or use of iterative reconstruction technique. CONTRAST:  120m OMNIPAQUE IOHEXOL 300 MG/ML  SOLN COMPARISON:  01/16/2021. FINDINGS: Lower chest: The heart is enlarged and pacemaker leads are noted in the heart. There are small bilateral  pleural effusions. Hepatobiliary: No focal liver abnormality. Intrahepatic and extrahepatic biliary ductal dilatation is noted. The common bile duct measures 1 cm. Multiple stones are present in the distal  common bile duct measuring up to 6 mm. Stones are present in the gallbladder. Pancreas: Unremarkable. No pancreatic ductal dilatation or surrounding inflammatory changes. Spleen: The spleen is normal in size. Scattered hypodensities are noted, possibly representing cysts or hemangiomas. Adrenals/Urinary Tract: No adrenal nodule or mass. The kidneys enhance symmetrically. Renal cortical scarring is noted bilaterally. A stable exophytic cyst is noted in the upper pole the right kidney. No renal calculus or hydronephrosis. The bladder is unremarkable. Stomach/Bowel: No bowel obstruction, free air, or pneumatosis. Gastric wall thickening is noted. Scattered diverticula are present along the colon without evidence of diverticulitis. The appendix is unremarkable. Mild bowel wall thickening is noted in the jejunum in the pelvis. Vascular/Lymphatic: Aortic atherosclerosis. No enlarged abdominal or pelvic lymph nodes. Reproductive: Status post hysterectomy. No adnexal masses. Other: Mesenteric edema is noted with free fluid at the gallbladder fossa, perihepatic space, and pelvis. Musculoskeletal: Sternotomy wires are present over the midline. Degenerative changes are noted in the thoracolumbar spine. No acute osseous abnormality. IMPRESSION: 1. Increased intrahepatic and extrahepatic biliary ductal dilatation. There is increased distention of the common bile duct measuring 1 cm with choledocholithiasis. Cholelithiasis is also noted. Free fluid is noted at the gallbladder fossa and perihepatic space posteriorly. Correlate clinically to exclude superimposed acute cholecystitis. 2. Jejunal bowel wall thickening in the pelvis, possible infectious or inflammatory enteritis. 3. Diffuse mesenteric edema and a small amount of free  fluid in the pelvis. 4. Small bilateral pleural effusions. 5. Gastric wall thickening, possible gastritis. 6. Aortic atherosclerosis. 7. Remaining ancillary findings as detailed above. Electronically Signed   By: Brett Fairy M.D.   On: 12/26/2021 00:54   DG Chest Port 1 View  Result Date: 12/25/2021 CLINICAL DATA:  Shortness of breath. EXAM: PORTABLE CHEST 1 VIEW COMPARISON:  01/16/2021. FINDINGS: The heart size and mediastinal contours are stable. There is atherosclerotic calcification of the aorta. A dual lead pacemaker is present over the left chest. There is blunting of the right costophrenic angle which is unchanged from the prior exam and may represent atelectasis or scarring. The left [name] is clear. No pneumothorax. There is evidence of prior cardiothoracic surgery with TAVR stent. IMPRESSION: 1. Blunting of the right costophrenic angle, possibly representing atelectasis or scarring is compared with previous exams. 2. Otherwise stable chest. Electronically Signed   By: Brett Fairy M.D.   On: 12/25/2021 23:40    EKG: Independently reviewed. Afib, rbbb  Assessment/Plan   Acute Cholecystitis -admit to progressive care  - start on zosyn  - npo  - supportive care with pain medication antiemetic - follow surgery rec in am , Dr Annye English consulted  -per EDP - not surgical candidate - place IR consult as well for possible cholecystostomy tube     Acute Gallstone Pancreatits -CBD measuring 1 cm with choledocholithiasis -npo  - ivfs  - gi and surgery consult for am  -gi -Coleen Kennedy epic msg - strict I/o  -lfts  normal currently    CAD s/p CABG -continue statin , asa , hold eliquis -noted elevated CE  -thought to be related to demand  - continue to monitor    Atrial fibrillation  - s/p MAZE procedure -hold AC due to planned procedure   Asthma  -continue inhalers once med rec completed  -no current  exacerbation   CVA -resume secondary ppx when able   Carotid  artery disease s/p right CEA   Fibromyalgia -resume home regimen one patient tolerating po    GERD -ppi iv  Mild to moderate MR  Severe AS s/p TAVR   Sick sinus syndrome  -s/p pacemaker  Mild Diastolic dysfunction -ef  60-65% -strict I/o   HTN -stable resume as able    HLD -resume statin as able   FEN Electrolytes stable replete prn   DVT prophylaxis: scd Code Status: full Family Communication: dtr atbedside Disposition Plan: patient  expected to be admitted greater than 2 midnights  Consults called: surgery  White MD,  Admission status: progressive care    Clance Boll MD [facility]   If 7PM-7AM, please contact night-coverage [email or web address] Password [email or web address]  12/26/2021, 3:45 AM

## 2021-12-26 NOTE — Progress Notes (Signed)
PROGRESS NOTE    [name]  NOB:[id number] DOB: 1929-05-13 DOA: 12/25/2021 PCP: Denita Lung, MD    Brief Narrative:   [name] is a 88 y.o. female with past medical history significant for CAD s/p CABG, paroxysmal atrial fibrillation s/p MAZE procedure, CAS s/p CEA, fibromyalgia, GERD, moderate MR, severe AS s/p TAVR, SSS s/p PPM, chronic diastolic congestive heart failure, essential hypertension, hyperlipidemia, asthma who presented to [facility] ED on 8/5 with progressive abdominal pain associated with nausea.  Daughter present who assists with HPI, reports started having back pain on her right side roughly 1 week ago that progressed to severe abdominal pain with associated nausea and dry heaves.  Denies fever/chills, no chest pain or shortness of breath.    In the ED, temperature 98.6 F, HR 95, RR 21, BP 156/71, SPO2 98% on room air.  Sodium 140, potassium 3.4, chloride 106, CO2 27, glucose 121, BUN 11, creatinine 0.54, AST 40, ALT 18, total bilirubin 1.0.  Lipase 43,580.  WBC 12.3, hemoglobin 12.7, platelets 152.  High sensitive troponin 19>23.  Chest x-ray with blunting the right costophrenic angle consistent with atelectasis versus scarring otherwise no other acute cardiopulmonary disease process.  CT abdomen/pelvis with contrast with increased intrahepatic/extrahepatic biliary ductal dilation, increased distention of the common bile duct measuring 1 cm with choledocholithiasis, free fluid gallbladder fossa and perihepatic space posteriorly, jejunal bowel wall thickening consistent with infectious versus inflammatory enteritis, diffuse mesenteric edema and small amount of free fluid in the pelvis, gastric wall thickening, aortic atherosclerosis.  EDP consulted general surgery, Dr. Vale Haven who recommended initiation of antibiotics and medicine admission.  TRH consulted for admission for acute cholecystitis, choledocholithiasis with also concerns of gallstone pancreatitis.  Assessment & Plan:    Acute cholecystitis Choledocholithiasis Gallstone pancreatitis Patient presenting with 1 week of progressive right-sided abdominal pain associate with nausea.  On admission, notably with leukocytosis and CT abdomen/pelvis with contrast with increased intrahepatic/extrahepatic biliary ductal dilation, increased distention of the common bile duct measuring 1 cm with choledocholithiasis, free fluid gallbladder fossa and perihepatic space. Ultrasound right upper quadrant with distended gallbladder with gallbladder wall thickening and positive sonographic Murphy sign. --General surgery and gastroenterology following, appreciate assistance --IR consulted but holding on percutaneous intervention until further evaluation by surgery/GI --Zosyn --IVF w/ LR at 6m/hr --N.p.o. --CMP, lipase daily --Supportive care, antiemetics, pain control --Further per GI/general surgery  Elevated troponin hs troponin 19>23>54>53; plateaued.  Suspect elevation likely secondary to type II demand ischemia in the setting of infection as above.  EKG with atrial fibrillation, rate 99.  On telemetry notably V paced. --Continue monitor on telemetry  CAD s/p CABG --Holding  statin while n.p.o.  Paroxysmal atrial fibrillation s/p MAZE Follows with electrophysiology outpatient. --Currently holding home Eliquis --Monitor on telemetry  CAS s/p CEA --Holding statin as above  GERD --Protonix 40 mg IV q24h while n.p.o.  Hx Severe AS s/p TAVR TTE 01/15/2021 with noted 23 mm SAPIEN prosthetic valve in the aortic position, unable to assess valve hemodynamically due to limited acoustic windows. --Outpatient follow-up with cardiology  Sick sinus syndrome s/p PPM -- Continue monitor on telemetry  Chronic diastolic congestive heart failure Essential hypertension TTE 12/2020 with LVEF 55-60%, LV normal function, mild LVH, RV systolic function mildly reduced, mild/moderate MR, moderate TR, TAVR noted.  Currently not on  antihypertensive therapy outpatient. --Continue monitor BP closely in the setting of infection as above  Hyperlipidemia --Holding statin  Asthma --Albuterol neb as needed  Severe protein calorie malnutrition  Body mass index is 16.53 kg/m. Significant fat loss and muscle wasting noted on physical exam. --Consult dietitian once able to transition to diet   DVT prophylaxis: SCDs Start: 12/26/21 0428    Code Status: Full Code Family Communication: Updated daughter present at bedside this morning  Disposition Plan:  Level of care: Progressive Status is: Inpatient Remains inpatient appropriate because: Continue IV antibiotics, awaiting general surgery/GI recommendations in regard to need for surgical intervention/ERCP.  May need IR percutaneous cholecystostomy but awaiting other specialty recommendations    Consultants:  General surgery, Dr. Horris Latino GI IR  Procedures:  None  Antimicrobials:  Zosyn 8/5>>   Subjective: Patient seen examined bedside, resting comfortably.  Lying in bed.  Daughter present.  States her abdominal pain is slightly improved; although fever this morning of 101.1.  Seen by general surgery, Dr. Dema Severin currently holding off surgical intervention until GI evaluation as patient may need ERCP given concerns for choledocholithiasis.  Evaluated by IR, holding on any percutaneous treatment until further evaluation by GI and further recommendations from general surgery.  No other specific complaints or concerns at this time.  Denies headache, no dizziness, no chest pain, no palpitations, no current nausea/vomiting, no diarrhea, no cough/congestion, no subjective fever, no chills/night sweats, no focal weakness, no paresthesias.  No acute events overnight per nursing staff.  Objective: Vitals:   12/26/21 0505 12/26/21 0530 12/26/21 0725 12/26/21 0922  BP:  119/66 (!) 108/50 (!) 106/58  Pulse:  70 91 94  Resp:  (!) 25 (!) 28 16  Temp: 98.9 F (37.2 C) 98.8  F (37.1 C) (!) 101.1 F (38.4 C) 99.9 F (37.7 C)  TempSrc: Oral Oral Oral Oral  SpO2:  94% 94% 92%  Weight:        Intake/Output Summary (Last 24 hours) at 12/26/2021 1102 Last data filed at 12/26/2021 0248 Gross per 24 hour  Intake 1096 ml  Output --  Net 1096 ml   Filed Weights   12/26/21 0500  Weight: 41 kg    Examination:  Physical Exam: GEN: NAD, alert and oriented x 3, elderly/thin/cachectic in appearance with significant muscle wasting/fat loss appreciated HEENT: NCAT, PERRL, EOMI, sclera clear, MMM PULM: CTAB w/o wheezes/crackles, normal respiratory effort, on room air CV: RRR w/o M/G/R GI: abd soft, mild right upper quadrant tenderness to palpation, abdomen nondistended, NABS, no R/G/M MSK: no peripheral edema, moves all extremities independently NEURO: CN II-XII intact, no focal deficits, sensation to light touch intact PSYCH: normal mood/affect Integumentary: dry/intact, no rashes or wounds    Data Reviewed: I have personally reviewed following labs and imaging studies  CBC: Recent Labs  Lab 12/25/21 2202 12/25/21 2214  WBC 12.3*  --   NEUTROABS 11.2*  --   HGB 12.7 13.3  HCT 39.2 39.0  MCV 94.0  --   PLT 152  --    Basic Metabolic Panel: Recent Labs  Lab 12/25/21 2202 12/25/21 2214  NA 140 140  K 3.4* 3.3*  CL 106 103  CO2 27  --   GLUCOSE 121* 112*  BUN 11 12  CREATININE 0.54 0.30*  CALCIUM 9.1  --    GFR: Estimated Creatinine Clearance: 30.3 mL/min (A) (by C-G formula based on SCr of 0.3 mg/dL (L)). Liver Function Tests: Recent Labs  Lab 12/25/21 2202  AST 40  ALT 18  ALKPHOS 41  BILITOT 1.0  PROT 6.6  ALBUMIN 3.9   Recent Labs  Lab 12/25/21 2202  LIPASE 3,580*   No results  for input(s): "AMMONIA" in the last 168 hours. Coagulation Profile: No results for input(s): "INR", "PROTIME" in the last 168 hours. Cardiac Enzymes: No results for input(s): "CKTOTAL", "CKMB", "CKMBINDEX", "TROPONINI" in the last 168 hours. BNP  (last 3 results) No results for input(s): "PROBNP" in the last 8760 hours. HbA1C: No results for input(s): "HGBA1C" in the last 72 hours. CBG: Recent Labs  Lab 12/26/21 0933  GLUCAP 91   Lipid Profile: No results for input(s): "CHOL", "HDL", "LDLCALC", "TRIG", "CHOLHDL", "LDLDIRECT" in the last 72 hours. Thyroid Function Tests: No results for input(s): "TSH", "T4TOTAL", "FREET4", "T3FREE", "THYROIDAB" in the last 72 hours. Anemia Panel: No results for input(s): "VITAMINB12", "FOLATE", "FERRITIN", "TIBC", "IRON", "RETICCTPCT" in the last 72 hours. Sepsis Labs: No results for input(s): "PROCALCITON", "LATICACIDVEN" in the last 168 hours.  No results found for this or any previous visit (from the past 240 hour(s)).       Radiology Studies: US Abdomen Limited RUQ (LIVER/GB)  Result Date: 12/26/2021 CLINICAL DATA:  Epigastric abdominal pain. EXAM: ULTRASOUND ABDOMEN LIMITED RIGHT UPPER QUADRANT COMPARISON:  Abdominopelvic CT earlier today demonstrating choledocholithiasis. FINDINGS: Gallbladder: The gallbladder is prominently distended. The gallstones on prior CT are not well demonstrated on the current exam. Gallbladder wall thickening at 5 mm. Positive sonographic Murphy sign noted by sonographer. Common bile duct: Diameter: 13 mm. The common bile duct stones on CT are not well seen on the current exam. Liver: Mild central intrahepatic biliary ductal dilatation. No focal liver lesion. Portal vein is patent on color Doppler imaging with normal direction of blood flow towards the liver. Other: Small amount of right upper quadrant free fluid. IMPRESSION: 1. Distended gallbladder with wall thickening and positive sonographic Murphy sign. The gallstones on CT earlier today are not well seen on the current exam. 2. Biliary dilatation, choledocholithiasis on CT not well demonstrated on the current exam. Electronically Signed   By: Keith Rake M.D.   On: 12/26/2021 02:18   CT ABDOMEN PELVIS W  CONTRAST  Result Date: 12/26/2021 CLINICAL DATA:  Abdominal pain and vomiting. EXAM: CT ABDOMEN AND PELVIS WITH CONTRAST TECHNIQUE: Multidetector CT imaging of the abdomen and pelvis was performed using the standard protocol following bolus administration of intravenous contrast. RADIATION DOSE REDUCTION: This exam was performed according to the departmental dose-optimization program which includes automated exposure control, adjustment of the mA and/or kV according to patient size and/or use of iterative reconstruction technique. CONTRAST:  179m OMNIPAQUE IOHEXOL 300 MG/ML  SOLN COMPARISON:  01/16/2021. FINDINGS: Lower chest: The heart is enlarged and pacemaker leads are noted in the heart. There are small bilateral pleural effusions. Hepatobiliary: No focal liver abnormality. Intrahepatic and extrahepatic biliary ductal dilatation is noted. The common bile duct measures 1 cm. Multiple stones are present in the distal common bile duct measuring up to 6 mm. Stones are present in the gallbladder. Pancreas: Unremarkable. No pancreatic ductal dilatation or surrounding inflammatory changes. Spleen: The spleen is normal in size. Scattered hypodensities are noted, possibly representing cysts or hemangiomas. Adrenals/Urinary Tract: No adrenal nodule or mass. The kidneys enhance symmetrically. Renal cortical scarring is noted bilaterally. A stable exophytic cyst is noted in the upper pole the right kidney. No renal calculus or hydronephrosis. The bladder is unremarkable. Stomach/Bowel: No bowel obstruction, free air, or pneumatosis. Gastric wall thickening is noted. Scattered diverticula are present along the colon without evidence of diverticulitis. The appendix is unremarkable. Mild bowel wall thickening is noted in the jejunum in the pelvis. Vascular/Lymphatic: Aortic atherosclerosis. No enlarged  abdominal or pelvic lymph nodes. Reproductive: Status post hysterectomy. No adnexal masses. Other: Mesenteric edema is noted  with free fluid at the gallbladder fossa, perihepatic space, and pelvis. Musculoskeletal: Sternotomy wires are present over the midline. Degenerative changes are noted in the thoracolumbar spine. No acute osseous abnormality. IMPRESSION: 1. Increased intrahepatic and extrahepatic biliary ductal dilatation. There is increased distention of the common bile duct measuring 1 cm with choledocholithiasis. Cholelithiasis is also noted. Free fluid is noted at the gallbladder fossa and perihepatic space posteriorly. Correlate clinically to exclude superimposed acute cholecystitis. 2. Jejunal bowel wall thickening in the pelvis, possible infectious or inflammatory enteritis. 3. Diffuse mesenteric edema and a small amount of free fluid in the pelvis. 4. Small bilateral pleural effusions. 5. Gastric wall thickening, possible gastritis. 6. Aortic atherosclerosis. 7. Remaining ancillary findings as detailed above. Electronically Signed   By: Brett Fairy M.D.   On: 12/26/2021 00:54   DG Chest Port 1 View  Result Date: 12/25/2021 CLINICAL DATA:  Shortness of breath. EXAM: PORTABLE CHEST 1 VIEW COMPARISON:  01/16/2021. FINDINGS: The heart size and mediastinal contours are stable. There is atherosclerotic calcification of the aorta. A dual lead pacemaker is present over the left chest. There is blunting of the right costophrenic angle which is unchanged from the prior exam and may represent atelectasis or scarring. The left lung is clear. No pneumothorax. There is evidence of prior cardiothoracic surgery with TAVR stent. IMPRESSION: 1. Blunting of the right costophrenic angle, possibly representing atelectasis or scarring is compared with previous exams. 2. Otherwise stable chest. Electronically Signed   By: Brett Fairy M.D.   On: 12/25/2021 23:40        Scheduled Meds:  pantoprazole (PROTONIX) IV  40 mg Intravenous Q24H   Continuous Infusions:  lactated ringers 150 mL/hr at 12/26/21 0525     LOS: 0 days     Time spent: 56 minutes spent on chart review, discussion with nursing staff, consultants, updating family and interview/physical exam; more than 50% of that time was spent in counseling and/or coordination of care.    Chizaram Latino J [place] DO [facility] Available via Epic secure chat 7am-7pm After these hours, please refer to coverage provider listed on [email or web address] 12/26/2021, 11:02 AM

## 2021-12-26 NOTE — Progress Notes (Signed)
  Request seen for percutaneous cholecystostomy.  Images and chart reviewed by Dr. Serafina Royals.  It appears she may have gallstone pancreatitis with secondary pericholecystic inflammatory changes.  Recommend see how she responds to antibiotics and agree with ERCP as planned by Dr. Dema Severin before considering perc chole.  Nollan Muldrow S Soila Printup PA-C 12/26/2021 10:06 AM

## 2021-12-26 NOTE — Consult Note (Addendum)
Referring Provider: Dr. Myles [name] Primary Care Physician:  Lori Lung, MD Primary Gastroenterologist: Dr. Juanita [name]  Reason for Consultation: Gallstone pancreatitis, choledocholithiasis [facility] covering for Dr. Collene [name] and [name]  HPI: [name] is a 88 y.o. female with a past medical history of anxiety, fibromyalgia, hypertension, hyperlipidemia, CAD s/p CABG 2006, atrial fibrillation s/p MAZE procedure 2006, mild to moderate MR, aortic stenosis S/P TAVR, sick sinus syndrome, s/p pacemaker placement 2022, atrial fibrillation on Eliquis, CVA, carotid artery disease s/p R CEA, GERD, chronic constipation and colon polyps.  She developed nausea with severe upper abdominal pain which progressively worsened over the past week and was severe yesterday.  She was transported to [facility] via EMS on 12/25/2021. Labs in the ED showed a WBC count of 12.3.  Hemoglobin 12.7.  Platelet 152.  Sodium 140.  Potassium 3.4.  BUN 11.  Creatinine 0.54.  Total bili 1.0.  Alk phos 41.  AST 40.  ALT 18.  Lipase 3580.  CTAP with contrast showed a normal pancreas, intrahepatic and extrahepatic biliary ductal dilatation with distention of the CBD with choledocholithiasis, cholelithiasis and likely acute cholecystitis, jejunal bowel wall thickening (possibly infectious versus inflammatory enteritis) and diffuse mesenteric edema and a small amount of free fluid in the pelvis, gastric wall thickening and small bilateral pleural effusions.  A GI consult was requested regarding gallstone pancreatitis with choledocholithiasis.  She is alert and conversant though speech is a little difficult to understand due to lack of dentition.  Daughter [name] is at the bedside who assists with obtaining her mother's history.  The patient developed upper abdominal pain which radiated to the right mid to lower back which started approximately 1 week ago which progressively worsened over the past few days.  She  had nausea but could not vomit.  She has intermittent dysphagia and infrequent heartburn.  She takes PPI twice daily.  She underwent an EGD by Dr. Benson [name] 02/2021 which was normal and her esophagus was empirically dilated.  She has chronic constipation and takes Linzess daily.  She passed 2 large brown bowel movements yesterday.  No rectal bleeding or black stools.  Last colonoscopy was in 2009, 2 benign polyps were removed.  She has a significant history of heart disease and A-fib as noted above her last dose of Eliquis was around 7 PM on 12/25/2021.  She has chronic angina, she takes sublingual NTG approximately once weekly with relief.  Her most recent echo 02/2021 showed LVEF 55 to 60%, mild to moderate mitral valve and tricuspid valve regurgitation.  No chest pain at this time.  PAST GI PROCEDURES:  EGD 03/19/2021 by Dr. Benson [name]. - No endoscopic esophageal abnormality to explain patient's dysphagia. Esophagus dilated. Dilated. - Normal stomach. - Normal examined duodenum. - No specimens collected.  Colonoscopy 12/28/2007 by Dr. Collene [name]: 2 cecal polyp Early sigmoid diverticulosis Internal and external hemorrhoids  ECHO 03/19/2021: IMPRESSIONS Left ventricular ejection fraction, by estimation, is 55 to 60%. The left ventricle has normal function. There is mild left ventricular hypertrophy. 1. Right ventricular systolic function is mildly reduced. The right ventricular size is normal. 2. The mitral valve is degenerative. Mild to moderate mitral valve regurgitation. Severe mitral annular calcification. 3. 4. Tricuspid valve regurgitation is mild to moderate. Unable to assess TAVR valve hemodynamically due to limited acoustic windows for imaging. Valve is grossly normal in appearance with trivial central prosthetic AI. No rocking or dehiscence.. The aortic valve has been repaired/replaced. Aortic valve regurgitation is trivial.  There is a 23 mm Sapien prosthetic (TAVR) valve present in the aortic  position. Procedure Date: 01/03/2017.   Past Medical History:  Diagnosis Date   Anxiety    Aortic Stenosis s/p TAVR    Echo 10/21: EF 55-60, no RWMA, mild asymmetric LVH, normal RVSF, RVSP 40.2 mmHg, mild MR, s/p TAVR with mean gradient 8.76mHg, no PVL   Arthritis    OSTEO   Aspirin allergy    on Plavix   Asthma    Bronchitis    CAD (coronary artery disease)    a. s/p CABG 2006 with Cox-Maze procedure.   [place]. s/p R CEA.   Chronic stable angina (HCC)    Diastolic dysfunction    Eczema    Fibromyalgia    GERD (gastroesophageal reflux disease)    Heart murmur    Hiatal hernia    Hyperlipidemia    Hypertension    Kyphoscoliosis    Mitral regurgitation    Myocardial infarction ([facility] 2003   Stent to CFX   PAF (paroxysmal atrial fibrillation) ([place]    a. pt has h/o hematuria on Eliquis and has since refused anticoagulation   Pneumonia 2015 ?   Pulmonary regurgitation    Skin cancer of arm    Stroke ([facility]    Tricuspid regurgitation     Past Surgical History:  Procedure Laterality Date   ABDOMINAL HYSTERECTOMY  1976   CAROTID ENDARTERECTOMY  2010   CORONARY ANGIOPLASTY WITH STENT PLACEMENT     CORONARY ARTERY BYPASS GRAFT  01/2005   LIMA-D1; SVG-LAD; SVG-OM; SVG-PDA   ESOPHAGOGASTRODUODENOSCOPY (EGD) WITH PROPOFOL N/A 03/19/2021   Procedure: ESOPHAGOGASTRODUODENOSCOPY (EGD) WITH PROPOFOL;  Surgeon: HCarol Ada MD;  Location: [facility];  Service: Endoscopy;  Laterality: N/A;   EYE SURGERY     MULTIPLE EXTRACTIONS WITH ALVEOLOPLASTY  12/28/2016   Extraction of tooth #'s 2- 5,7-10, 12,13,17-20,and 22-29 with alveoloplasty and maxillary right and left lateral exostoses reductions   MULTIPLE EXTRACTIONS WITH ALVEOLOPLASTY N/A 12/28/2016   Procedure: Extraction of tooth #'s 2- 5,7-10, 12,13,17-20,and 22-29 with alveoloplasty and maxillary right and left lateral exostoses reductions;  Surgeon: KLenn Cal DDS;  [place] [facility]  Service:  Oral Surgery;  Laterality: N/A;   PACEMAKER IMPLANT N/A 01/15/2021   Procedure: PACEMAKER IMPLANT;  Surgeon: CConstance Haw MD;  [facility];  Service: Cardiovascular;  Laterality: N/A;   RIGHT/LEFT HEART CATH AND CORONARY/GRAFT ANGIOGRAPHY N/A 10/27/2016   Procedure: Right/Left Heart Cath and Coronary/Graft Angiography;  Surgeon: MBurnell Blanks MD;  [facility];  Service: Cardiovascular;  Laterality: N/A;   SAVORY DILATION N/A 03/19/2021   Procedure: SAVORY DILATION;  Surgeon: HCarol Ada MD;  [facility];  Service: Endoscopy;  Laterality: N/A;   TEE WITHOUT CARDIOVERSION N/A 01/03/2017   Procedure: TRANSESOPHAGEAL ECHOCARDIOGRAM (TEE);  Surgeon: MBurnell Blanks MD;  [place]  Service: Open Heart Surgery;  Laterality: N/A;   TONSILLECTOMY     TRANSCATHETER AORTIC VALVE REPLACEMENT, TRANSFEMORAL N/A 01/03/2017   Procedure: TRANSCATHETER AORTIC VALVE REPLACEMENT, TRANSFEMORAL;  Surgeon: MBurnell Blanks MD;  [place] [facility]  Service: Open Heart Surgery;  Laterality: N/A;   TUMOR REMOVAL      Prior to Admission medications   Medication Sig Start Date End Date Taking? Authorizing Provider  ondansetron (ZOFRAN-ODT) 4 MG disintegrating tablet 470mODT q4 hours prn nausea/vomit 12/25/21  Yes YaDrenda FreezeMD  acetaminophen (TYLENOL) 650 MG CR tablet  Take 1,300 mg by mouth 2 (two) times daily as needed for pain.     [provider]  apixaban (ELIQUIS) 2.5 MG TABS tablet TAKE 1 TABLET(2.5 MG) BY MOUTH TWICE DAILY 08/16/21   Burnell Blanks, MD  Cholecalciferol (VITAMIN D-3) 25 MCG (1000 UT) CAPS Take 1,000 Units by mouth daily.    [provider]  Ensure (ENSURE) Take 237 mLs by mouth daily.    [provider]  fluticasone (FLONASE) 50 MCG/ACT nasal spray SHAKE LIQUID AND USE 2 SPRAYS IN EACH NOSTRIL EVERY DAY 02/10/21   Medina-Vargas, Monina C, NP  fluticasone (FLOVENT HFA) 220  MCG/ACT inhaler INHALE 2 PUFFS INTO THE LUNGS TWICE DAILY 07/14/21   Lori Lung, MD  guaiFENesin (MUCINEX) 600 MG 12 hr tablet Take 1 tablet (600 mg total) by mouth 2 (two) times daily. 05/25/14   Regalado, Belkys A, MD  LINZESS 72 MCG capsule Take 1 capsule (72 mcg total) by mouth daily. 02/10/21   Medina-Vargas, Monina C, NP  loratadine (CLARITIN) 10 MG tablet Take 1 tablet (10 mg total) by mouth daily. 02/10/21   Medina-Vargas, Monina C, NP  nitroGLYCERIN (NITROSTAT) 0.4 MG SL tablet DISSOLVE 1 TABLET UNDER THE TONGUE EVERY 5 MINUTES AS NEEDED FOR CHEST PAIN 11/01/21   Burnell Blanks, MD  pantoprazole (PROTONIX) 40 MG tablet TAKE 1 TABLET BY MOUTH TWICE DAILY 07/08/21   Lori Lung, MD  polyethylene glycol powder (MIRALAX) 17 GM/SCOOP powder Take 17 g by mouth 2 (two) times daily as needed for moderate constipation. Patient not taking: Reported on 10/07/2021 02/10/21   Medina-Vargas, Monina C, NP  rosuvastatin (CRESTOR) 40 MG tablet TAKE 1 TABLET(40 MG) BY MOUTH DAILY 12/07/21   Lori Lung, MD  VENTOLIN HFA [phone] Base) MCG/ACT inhaler INHALE 2 PUFFS INTO THE LUNGS EVERY 6 HOURS AS NEEDED FOR WHEEZING OR SHORTNESS OF BREATH 07/15/21   Lori Lung, MD  Wheat Dextrin (BENEFIBER PO) Take 1 Scoop by mouth daily as needed (fiber).    [provider]    Current Facility-Administered Medications  Medication Dose Route Frequency Provider Last Rate Last Admin   acetaminophen (TYLENOL) tablet 650 mg  650 mg Oral Q6H PRN Clance Boll, MD       Or   acetaminophen (TYLENOL) suppository 650 mg  650 mg Rectal Q6H PRN Clance Boll, MD       albuterol (PROVENTIL) (2.5 MG/3ML) 0.083% nebulizer solution 2.5 mg  2.5 mg Nebulization Q2H PRN [name] A, MD       fentaNYL (SUBLIMAZE) injection 12.5-50 mcg  12.5-50 mcg Intravenous Q2H PRN Clance Boll, MD       lactated ringers infusion   Intravenous Continuous Clance Boll, MD 150 mL/hr at 12/26/21 0525  New Bag at 12/26/21 0525   ondansetron (ZOFRAN) tablet 4 mg  4 mg Oral Q6H PRN Clance Boll, MD       Or   ondansetron [facility]) injection 4 mg  4 mg Intravenous Q6H PRN Clance Boll, MD       oxyCODONE (Oxy IR/ROXICODONE) immediate release tablet 5 mg  5 mg Oral Q4H PRN Clance Boll, MD       pantoprazole (PROTONIX) injection 40 mg  40 mg Intravenous Q24H Clance Boll, MD        Allergies as of 12/25/2021 - Review Complete 12/25/2021  Allergen Reaction Noted   Other Other (See Comments) 04/22/2020   Bactrim [sulfamethoxazole-trimethoprim] Other (See Comments)  02/07/2014   Amitriptyline hcl Rash    Aspirin Hives, Swelling, Rash, and Other (See Comments) 10/23/2007   Tape Other (See Comments) 04/22/2020   Zetia [ezetimibe] Rash 10/05/2010    Family History  Problem Relation Age of Onset   Heart failure Mother    Other Father     Social History   Socioeconomic History   Marital status: Widowed    Spouse name: Not on file   Number of children: 3   Years of education: Not on file   Highest education level: Not on file  Occupational History    Employer: RETIRED  Tobacco Use   Smoking status: Never   Smokeless tobacco: Former    Types: Snuff    Quit date: 05/23/2001   Tobacco comments:    Former Snuff (02/04/2021)  Vaping Use   Vaping Use: Never used  Substance and Sexual Activity   Alcohol use: No   Drug use: No   Sexual activity: Not Currently  Other Topics Concern   Not on file  Social History Narrative   Not on file   Social Determinants of Health   Financial Resource Strain: Low Risk  (04/09/2021)   Overall Financial Resource Strain (CARDIA)    Difficulty of Paying Living Expenses: Not hard at all  Food Insecurity: No Food Insecurity (04/09/2021)   Hunger Vital Sign    Worried About Running Out of Food in the Last Year: Never true    [place] in the Last Year: Never true  Transportation Needs: No Transportation Needs  (04/09/2021)   PRAPARE - Hydrologist (Medical): No    Lack of Transportation (Non-Medical): No  Physical Activity: Insufficiently Active (04/09/2021)   Exercise Vital Sign    Days of Exercise per Week: 2 days    Minutes of Exercise per Session: 10 min  Stress: No Stress Concern Present (04/09/2021)   [place]    Feeling of Stress : Not at all  Social Connections: Not on file  Intimate Partner Violence: Not on file    Review of Systems: Gen: Denies fever, sweats or chills. + weight loss.  CV: See HPI.   Resp: + Intermittent SOB. No hemoptysis.  GI: See HPI GU : + Frequent urination. MS: + Generalized weakness. Derm: Denies rash, itchiness, skin lesions or unhealing ulcers. Psych: + Anxiety. Heme: Denies easy bruising, bleeding. Neuro:  Denies headaches, dizziness or paresthesias. Endo:  Denies any problems with DM, thyroid or adrenal function.  Physical Exam: Vital signs in last 24 hours: Temp:  [98.6 F (37 C)-99.3 F (37.4 C)] 98.8 F (37.1 C) (08/06 0530) Pulse Rate:  [68-99] 70 (08/06 0530) Resp:  [16-36] 25 (08/06 0530) BP: (100-156)/(43-82) 119/66 (08/06 0530) SpO2:  [93 %-98 %] 94 % (08/06 0530) Weight:  [41 kg] 41 kg (08/06 0500) Last BM Date :  (PTA) General:  Alert cachectic appearing 88 year old female in no acute distress. Head:  Normocephalic and atraumatic. Eyes:  No scleral icterus. Conjunctiva pink. Ears:  Normal auditory acuity. Nose:  No deformity, discharge or lesions. Mouth: Absent dentition.  No ulcers or lesions.  Neck:  Supple. No lymphadenopathy or thyromegaly.  Lungs: Breath sounds clear, diminished throughout. Heart: Rate and rhythm, systolic murmur Abdomen: Flat, nontender.  Positive bowel sounds to all 4 quadrants.  No palpable mass. Rectal: Deferred. Musculoskeletal:  Symmetrical without gross deformities.  Pulses:  Normal pulses  noted. Extremities:  Without clubbing  or edema. Neurologic:  Alert and  oriented x 4.  Moves all extremities. Skin:  Intact without significant lesions or rashes. Psych:  Alert and cooperative. Normal mood and affect.  Intake/Output from previous day: 08/05 0701 - 08/06 0700 In: 1096 [IV Piggyback:1096] Out: -  Intake/Output this shift: No intake/output data recorded.  Lab Results: Recent Labs    12/25/21 2202 12/25/21 2214  WBC 12.3*  --   HGB 12.7 13.3  HCT 39.2 39.0  PLT 152  --    BMET Recent Labs    12/25/21 2202 12/25/21 2214  NA 140 140  K 3.4* 3.3*  CL 106 103  CO2 27  --   GLUCOSE 121* 112*  BUN 11 12  CREATININE 0.54 0.30*  CALCIUM 9.1  --    LFT Recent Labs    12/25/21 2202  PROT 6.6  ALBUMIN 3.9  AST 40  ALT 18  ALKPHOS 41  BILITOT 1.0   PT/INR No results for input(s): "LABPROT", "INR" in the last 72 hours. Hepatitis Panel No results for input(s): "HEPBSAG", "HCVAB", "HEPAIGM", "HEPBIGM" in the last 72 hours.    Studies/Results: US Abdomen Limited RUQ (LIVER/GB)  Result Date: 12/26/2021 CLINICAL DATA:  Epigastric abdominal pain. EXAM: ULTRASOUND ABDOMEN LIMITED RIGHT UPPER QUADRANT COMPARISON:  Abdominopelvic CT earlier today demonstrating choledocholithiasis. FINDINGS: Gallbladder: The gallbladder is prominently distended. The gallstones on prior CT are not well demonstrated on the current exam. Gallbladder wall thickening at 5 mm. Positive sonographic Murphy sign noted by sonographer. Common bile duct: Diameter: 13 mm. The common bile duct stones on CT are not well seen on the current exam. Liver: Mild central intrahepatic biliary ductal dilatation. No focal liver lesion. Portal vein is patent on color Doppler imaging with normal direction of blood flow towards the liver. Other: Small amount of right upper quadrant free fluid. IMPRESSION: 1. Distended gallbladder with wall thickening and positive sonographic Murphy sign. The gallstones on CT  earlier today are not well seen on the current exam. 2. Biliary dilatation, choledocholithiasis on CT not well demonstrated on the current exam. Electronically Signed   By: Keith Rake M.D.   On: 12/26/2021 02:18   CT ABDOMEN PELVIS W CONTRAST  Result Date: 12/26/2021 CLINICAL DATA:  Abdominal pain and vomiting. EXAM: CT ABDOMEN AND PELVIS WITH CONTRAST TECHNIQUE: Multidetector CT imaging of the abdomen and pelvis was performed using the standard protocol following bolus administration of intravenous contrast. RADIATION DOSE REDUCTION: This exam was performed according to the departmental dose-optimization program which includes automated exposure control, adjustment of the mA and/or kV according to patient size and/or use of iterative reconstruction technique. CONTRAST:  164m OMNIPAQUE IOHEXOL 300 MG/ML  SOLN COMPARISON:  01/16/2021. FINDINGS: Lower chest: The heart is enlarged and pacemaker leads are noted in the heart. There are small bilateral pleural effusions. Hepatobiliary: No focal liver abnormality. Intrahepatic and extrahepatic biliary ductal dilatation is noted. The common bile duct measures 1 cm. Multiple stones are present in the distal common bile duct measuring up to 6 mm. Stones are present in the gallbladder. Pancreas: Unremarkable. No pancreatic ductal dilatation or surrounding inflammatory changes. Spleen: The spleen is normal in size. Scattered hypodensities are noted, possibly representing cysts or hemangiomas. Adrenals/Urinary Tract: No adrenal nodule or mass. The kidneys enhance symmetrically. Renal cortical scarring is noted bilaterally. A stable exophytic cyst is noted in the upper pole the right kidney. No renal calculus or hydronephrosis. The bladder is unremarkable. Stomach/Bowel: No bowel obstruction, free air, or pneumatosis. Gastric wall  thickening is noted. Scattered diverticula are present along the colon without evidence of diverticulitis. The appendix is unremarkable. Mild  bowel wall thickening is noted in the jejunum in the pelvis. Vascular/Lymphatic: Aortic atherosclerosis. No enlarged abdominal or pelvic lymph nodes. Reproductive: Status post hysterectomy. No adnexal masses. Other: Mesenteric edema is noted with free fluid at the gallbladder fossa, perihepatic space, and pelvis. Musculoskeletal: Sternotomy wires are present over the midline. Degenerative changes are noted in the thoracolumbar spine. No acute osseous abnormality. IMPRESSION: 1. Increased intrahepatic and extrahepatic biliary ductal dilatation. There is increased distention of the common bile duct measuring 1 cm with choledocholithiasis. Cholelithiasis is also noted. Free fluid is noted at the gallbladder fossa and perihepatic space posteriorly. Correlate clinically to exclude superimposed acute cholecystitis. 2. Jejunal bowel wall thickening in the pelvis, possible infectious or inflammatory enteritis. 3. Diffuse mesenteric edema and a small amount of free fluid in the pelvis. 4. Small bilateral pleural effusions. 5. Gastric wall thickening, possible gastritis. 6. Aortic atherosclerosis. 7. Remaining ancillary findings as detailed above. Electronically Signed   By: Brett Fairy M.D.   On: 12/26/2021 00:54   DG Chest Port 1 View  Result Date: 12/25/2021 CLINICAL DATA:  Shortness of breath. EXAM: PORTABLE CHEST 1 VIEW COMPARISON:  01/16/2021. FINDINGS: The heart size and mediastinal contours are stable. There is atherosclerotic calcification of the aorta. A dual lead pacemaker is present over the left chest. There is blunting of the right costophrenic angle which is unchanged from the prior exam and may represent atelectasis or scarring. The left [name] is clear. No pneumothorax. There is evidence of prior cardiothoracic surgery with TAVR stent. IMPRESSION: 1. Blunting of the right costophrenic angle, possibly representing atelectasis or scarring is compared with previous exams. 2. Otherwise stable chest.  Electronically Signed   By: Brett Fairy M.D.   On: 12/25/2021 23:40    IMPRESSION/PLAN:  50) 88 year old female with abdominal pain, nausea without vomiting x 7 days with gallstone pancreatitis and choledocholithiasis. Normal LFTs. Lipase 3,580.  WBC 12.3.  Afebrile.  At risk for ascending cholangitis. -Await CBC, CMP and lipase level to be drawn at 12pm -NPO -IVFs per the medical service/cardiology -Agree with Zosyn IV -IR consult for possible perc cholecystostomy per the hospitalist, IR recommended seeing how she responds to antibiotics prior to considering perc cholecystostomy. -General surgery consult, not sure patient is a surgical candidate. Await cardiology recommendations. -Eventual ERCP by Dr. Benson [name] -CBC, hepatic panel, BMP and lipase level in a.m. -Zofran 4 mg p.o. or IV every 6 hours as needed -Pain management per the hospitalist -Await further recommendations per Dr. Lyndel Safe  2) GERD, dysphagia. CTAP showed gastric wall thickening. Normal EGD 02/2021.  -PPI IV bid  3) Atrial fibrillation.  Last dose of Eliquis was 7 PM on 8/5. -Continue to hold Eliquis  4) Significant cardiac history including CAD s/p CABG 2006, atrial fibrillation s/p MAZE procedure 2006, mild to moderate MR, aortic stenosis S/P TAVR, sick sinus syndrome, s/p pacemaker placement 2022. Echo August 2022 with LVEF=55-60%, mild to moderate MR. Normally functioning bioprosthetic AVR. -Consider repeat ECHO  5) Chronic constipation, on Linzess. Passed 2 large solid and loose stools yesterday. CTAP showed possible enteritis. Likely due to [place].   6) History of CVA s/p  R CEA   Noralyn Pick  12/26/2021, 11:33AM     Attending physician's note   I have taken history, reviewed the chart and examined the patient. I performed a substantive portion of this encounter, including complete performance of  at least one of the key components, in conjunction with the APP. I agree with the Advanced Practitioner's  note, impression and recommendations.   Acute biliary pancreatitis-clinically improving. Lipase trending down.  No ascending cholangitis Choledocholithiasis on CT. Not a candidate for MRCP d/t pacemaker A-fib/CVA on Eliquis (last dose 8/5 7PM) GERD with dysphagia s/p dil Dr [name] 02/2021 Multiple comorbidities as above.  Plan: -[place] for clears. (Abdo pain has resolved, no N/V).  -Trend CBC, CMP, lipase -Hold Eliquis. If heparin is indicated medically, can start (med svc to determine) -ERCP (timing to be determined by Dr. Benson [name]).  It appears that she may have passed the inciting stone.  Just to be on the safer side, would keep her n.p.o. after midnight. -D/W daughter    Carmell Austria, MD [facility] [phone]

## 2021-12-26 NOTE — ED Provider Notes (Signed)
I assumed care of this patient.  Please see previous provider note for further details of Hx, PE.  Briefly patient is a 88 y.o. female who presented abdominal pain and diarrhea.  Initial work-up notable for leukocytosis.  Still pending lipase and CT scan.  Lipase elevated greater than 3500. CT scan revealed evidence of choledocholithiasis with acute cholecystitis possible enteritis. Right upper quadrant ultrasound ordered to better characterize and confirm acute cholecystitis.  No obvious choledocholithiasis.  Patient's LFTs were normal.  I discussed case with Dr. Dema Severin from general surgery who recommended antibiotic and medicine admission.  They will see patient in the morning for further recommendations.  Spoke with Dr. Marcello Moores from hospitalist service who agreed to admit patient for further work-up and management      Ameer Sanden, Grayce Sessions, MD 12/26/21 [phone]

## 2021-12-26 NOTE — Progress Notes (Signed)
Date and time results received: 12/26/21 0445  Test: Troponin Critical Value: 93  Name of Provider Notified: A. Zierle-Ghosh  , MD  Orders Received? Or Actions Taken?: Actions Taken: Paged MD @  0530, no response by 7am  Will make oncoming nurse aware

## 2021-12-26 NOTE — Progress Notes (Signed)
Pharmacy Antibiotic Note  [name] is a 88 y.o. female admitted on 12/25/2021 with  Chancy Milroy Pancreatitis/ Choledocholithiasis .  Pharmacy has been consulted for Zosyn (piperacillin-tazobactam) dosing.  Patient received 1x dose of Zosyn 3.375g in ED on 8/6 at 01:40.   WBC 12.3, Tmax 101.1 SCr 0.3 CT Abdomen (8/6): distention in common bile duct w/ choledocholithiasis US Abdomen (8/6): distended gallbladder w/ wall thickening and positive sonographic Murphy sign  Plan: Start Zosyn 3.375g IV q8h (4 hour infusion). Monitor daily WBC, temp, Scr, and clinical s/sx of infection F/u plan for ERCP  Weight: 41 kg (90 lb 6.2 oz)  Temp (24hrs), Avg:99.4 F (37.4 C), Min:98.6 F (37 C), Max:101.1 F (38.4 C)  Recent Labs  Lab 12/25/21 2202 12/25/21 2214  WBC 12.3*  --   CREATININE 0.54 0.30*    Estimated Creatinine Clearance: 30.3 mL/min (A) (by C-G formula based on SCr of 0.3 mg/dL (L)).    Allergies  Allergen Reactions   Other Other (See Comments)    NO ACIDIC, TART< OR SPICY FOODS- DEVELOPS REFLUX OFTEN!!  PATIENT HAS TROUBLE SWALLOWING TABLETS!!   Bactrim [Sulfamethoxazole-Trimethoprim] Other (See Comments)    "makes her feel funny" or "unsteady"    Amitriptyline Hcl Rash   Aspirin Hives, Swelling, Rash and Other (See Comments)    Body became swollen   Tape Other (See Comments)    SKIN IS VERY THIN- WILL TEAR EASILY!!   Zetia [Ezetimibe] Rash    Antimicrobials this admission: Zosyn 8/6 >>   Dose adjustments this admission: N/A  Microbiology results: None  Thank you for allowing pharmacy to be a part of this patient's care.  Kaleen Mask 12/26/2021 11:38 AM

## 2021-12-27 ENCOUNTER — Other Ambulatory Visit (HOSPITAL_COMMUNITY): Payer: Medicare Other

## 2021-12-27 DIAGNOSIS — Z01810 Encounter for preprocedural cardiovascular examination: Secondary | ICD-10-CM

## 2021-12-27 DIAGNOSIS — Z952 Presence of prosthetic heart valve: Secondary | ICD-10-CM

## 2021-12-27 DIAGNOSIS — I25118 Atherosclerotic heart disease of native coronary artery with other forms of angina pectoris: Secondary | ICD-10-CM | POA: Diagnosis not present

## 2021-12-27 DIAGNOSIS — I350 Nonrheumatic aortic (valve) stenosis: Secondary | ICD-10-CM

## 2021-12-27 DIAGNOSIS — K810 Acute cholecystitis: Secondary | ICD-10-CM | POA: Diagnosis not present

## 2021-12-27 LAB — CBC
HCT: 33.4 % — ABNORMAL LOW (ref 36.0–46.0)
Hemoglobin: 11.2 g/dL — ABNORMAL LOW (ref 12.0–15.0)
MCH: 31 pg (ref 26.0–34.0)
MCHC: 33.5 g/dL (ref 30.0–36.0)
MCV: 92.5 fL (ref 80.0–100.0)
Platelets: 109 10*3/uL — ABNORMAL LOW (ref 150–400)
RBC: 3.61 MIL/uL — ABNORMAL LOW (ref 3.87–5.11)
RDW: 14.9 % (ref 11.5–15.5)
WBC: 7.6 10*3/uL (ref 4.0–10.5)
nRBC: 0 % (ref 0.0–0.2)

## 2021-12-27 LAB — COMPREHENSIVE METABOLIC PANEL
ALT: 25 U/L (ref 0–44)
AST: 28 U/L (ref 15–41)
Albumin: 2.9 g/dL — ABNORMAL LOW (ref 3.5–5.0)
Alkaline Phosphatase: 33 U/L — ABNORMAL LOW (ref 38–126)
Anion gap: 9 (ref 5–15)
BUN: 10 mg/dL (ref 8–23)
CO2: 22 mmol/L (ref 22–32)
Calcium: 8.7 mg/dL — ABNORMAL LOW (ref 8.9–10.3)
Chloride: 107 mmol/L (ref 98–111)
Creatinine, Ser: 0.51 mg/dL (ref 0.44–1.00)
GFR, Estimated: >60 mL/min (ref >60)
Glucose, Bld: 84 mg/dL (ref 70–99)
Potassium: 3.1 mmol/L — ABNORMAL LOW (ref 3.5–5.1)
Sodium: 138 mmol/L (ref 135–145)
Total Bilirubin: 1.1 mg/dL (ref 0.3–1.2)
Total Protein: 5.2 g/dL — ABNORMAL LOW (ref 6.5–8.1)

## 2021-12-27 LAB — LIPASE, BLOOD: Lipase: 292 U/L — ABNORMAL HIGH (ref 11–51)

## 2021-12-27 MED ORDER — POTASSIUM CHLORIDE 10 MEQ/100ML IV SOLN
10.0000 meq | INTRAVENOUS | Status: AC
  Administered 2021-12-27 (×6): 10 meq via INTRAVENOUS
  Filled 2021-12-27 (×6): qty 100

## 2021-12-27 MED ORDER — HALOPERIDOL LACTATE 5 MG/ML IJ SOLN
1.0000 mg | Freq: Four times a day (QID) | INTRAMUSCULAR | Status: DC | PRN
  Administered 2021-12-27: 1 mg via INTRAVENOUS
  Filled 2021-12-27: qty 1

## 2021-12-27 MED ORDER — LIP MEDEX EX OINT
TOPICAL_OINTMENT | CUTANEOUS | Status: DC | PRN
  Administered 2021-12-27: 75 via TOPICAL
  Filled 2021-12-27: qty 7

## 2021-12-27 MED ORDER — NITROGLYCERIN 0.4 MG SL SUBL
0.4000 mg | SUBLINGUAL_TABLET | SUBLINGUAL | Status: DC | PRN
  Administered 2021-12-27: 0.4 mg via SUBLINGUAL
  Filled 2021-12-27: qty 1

## 2021-12-27 NOTE — Progress Notes (Addendum)
PROGRESS NOTE    [name]  BJS:[id number] DOB: 1929-01-12 DOA: 12/25/2021 PCP: Denita Lung, MD    Brief Narrative:   [name] is a 88 y.o. female with past medical history significant for CAD s/p CABG, paroxysmal atrial fibrillation s/p MAZE procedure, CAS s/p CEA, fibromyalgia, GERD, moderate MR, severe AS s/p TAVR, SSS s/p PPM, chronic diastolic congestive heart failure, essential hypertension, hyperlipidemia, asthma who presented to [facility] ED on 8/5 with progressive abdominal pain associated with nausea.  Daughter present who assists with HPI, reports started having back pain on her right side roughly 1 week ago that progressed to severe abdominal pain with associated nausea and dry heaves.  Denies fever/chills, no chest pain or shortness of breath.    In the ED, temperature 98.6 F, HR 95, RR 21, BP 156/71, SPO2 98% on room air.  Sodium 140, potassium 3.4, chloride 106, CO2 27, glucose 121, BUN 11, creatinine 0.54, AST 40, ALT 18, total bilirubin 1.0.  Lipase 43,580.  WBC 12.3, hemoglobin 12.7, platelets 152.  High sensitive troponin 19>23.  Chest x-ray with blunting the right costophrenic angle consistent with atelectasis versus scarring otherwise no other acute cardiopulmonary disease process.  CT abdomen/pelvis with contrast with increased intrahepatic/extrahepatic biliary ductal dilation, increased distention of the common bile duct measuring 1 cm with choledocholithiasis, free fluid gallbladder fossa and perihepatic space posteriorly, jejunal bowel wall thickening consistent with infectious versus inflammatory enteritis, diffuse mesenteric edema and small amount of free fluid in the pelvis, gastric wall thickening, aortic atherosclerosis.  EDP consulted general surgery, Dr. Vale Haven who recommended initiation of antibiotics and medicine admission.  TRH consulted for admission for acute cholecystitis, choledocholithiasis with also concerns of gallstone pancreatitis.  Assessment & Plan:    Acute cholecystitis Choledocholithiasis Gallstone pancreatitis Patient presenting with 1 week of progressive right-sided abdominal pain associate with nausea.  On admission, notably with leukocytosis and CT abdomen/pelvis with contrast with increased intrahepatic/extrahepatic biliary ductal dilation, increased distention of the common bile duct measuring 1 cm with choledocholithiasis, free fluid gallbladder fossa and perihepatic space. Ultrasound right upper quadrant with distended gallbladder with gallbladder wall thickening and positive sonographic Murphy sign. --General surgery and gastroenterology following, appreciate assistance --Lipase [phone] --IR consulted but holding on percutaneous intervention until further evaluation by surgery/GI --Zosyn --IVF w/ LR at 49m/hr --CMP, lipase daily --Supportive care, antiemetics, pain control --Clear liquid diet, n.p.o. after midnight for planned ERCP tomorrow --Further per GI/general surgery  Elevated troponin hs troponin 19>23>54>53; plateaued.  Suspect elevation likely secondary to type II demand ischemia in the setting of infection as above.  EKG with atrial fibrillation, rate 99.  On telemetry notably V paced. --Continue monitor on telemetry  Hypokalemia Potassium 3.1, will replete. --Repeat electrolytes in a.m. to include magnesium  CAD s/p CABG --Holding  statin while n.p.o. --Cardiology consulted for preoperative evaluation as requested by general surgery, Dr. WDema Severin  Paroxysmal atrial fibrillation s/p MAZE Follows with electrophysiology outpatient. --Currently holding home Eliquis --Monitor on telemetry  CAS s/p CEA --Holding statin as above  GERD --Protonix 40 mg IV q24h while n.p.o.  Hx Severe AS s/p TAVR TTE 01/15/2021 with noted 23 mm SAPIEN prosthetic valve in the aortic position, unable to assess valve hemodynamically due to limited acoustic windows. --Outpatient follow-up with cardiology  Sick sinus syndrome  s/p PPM --Continue monitor on telemetry  Chronic diastolic congestive heart failure Essential hypertension TTE 12/2020 with LVEF 55-60%, LV normal function, mild LVH, RV systolic function mildly reduced, mild/moderate MR, moderate TR,  TAVR noted.  Currently not on antihypertensive therapy outpatient. --Continue monitor BP closely in the setting of infection as above  Hyperlipidemia --Holding statin  Asthma --Albuterol neb as needed  Severe protein calorie malnutrition Body mass index is 16.69 kg/m. Significant fat loss and muscle wasting noted on physical exam. --Consult dietitian once able to transition to diet   DVT prophylaxis: SCDs Start: 12/26/21 0428    Code Status: Full Code Family Communication: Updated daughter present at bedside this morning  Disposition Plan:  Level of care: Progressive Status is: Inpatient Remains inpatient appropriate because: Continue IV antibiotics, GI planning ERCP tomorrow.  Await further recommendations from general surgery.      Consultants:  General surgery, Dr. Horris Latino GI IR Cardiology - Per request from general surgery for preoperative clearance  Procedures:  None  Antimicrobials:  Zosyn 8/5>>   Subjective: Patient seen examined bedside, resting comfortably.  Lying in bed.  Daughter present.  Reports her abdominal pain remains improved.  Has been afebrile with resolution of leukocytosis.  Remains on IV antibiotics.  Plan ERCP by GI tomorrow. No other specific complaints or concerns at this time.  Denies headache, no dizziness, no chest pain, no palpitations, no current nausea/vomiting, no diarrhea, no cough/congestion, no subjective fever, no chills/night sweats, no focal weakness, no paresthesias.  No acute events overnight per nursing staff.  Objective: Vitals:   12/26/21 1923 12/26/21 2302 12/27/21 0302 12/27/21 0813  BP: (!) 105/53 128/68 (!) 121/47   Pulse: 70 70 70   Resp: 20 19 (!) 22   Temp: 98.6 F (37 C) 99 F  (37.2 C) 99 F (37.2 C) 99.5 F (37.5 C)  TempSrc: Oral Oral Oral Oral  SpO2: 94% 93% 93%   Weight:   41.4 kg     Intake/Output Summary (Last 24 hours) at 12/27/2021 1053 Last data filed at 12/26/2021 1800 Gross per 24 hour  Intake 1229.78 ml  Output --  Net 1229.78 ml   Filed Weights   12/26/21 0500 12/27/21 0302  Weight: 41 kg 41.4 kg    Examination:  Physical Exam: GEN: NAD, alert and oriented x 3, elderly/thin/cachectic in appearance with significant muscle wasting/fat loss appreciated HEENT: NCAT, PERRL, EOMI, sclera clear, MMM PULM: CTAB w/o wheezes/crackles, normal respiratory effort, on room air CV: RRR w/o M/G/R GI: abd soft, slight right upper quadrant tenderness to palpation, abdomen nondistended, NABS, no R/G/M MSK: no peripheral edema, moves all extremities independently NEURO: CN II-XII intact, no focal deficits, sensation to light touch intact PSYCH: normal mood/affect Integumentary: dry/intact, no rashes or wounds    Data Reviewed: I have personally reviewed following labs and imaging studies  CBC: Recent Labs  Lab 12/25/21 2202 12/25/21 2214 12/26/21 0652 12/27/21 0221  WBC 12.3*  --  13.4* 7.6  NEUTROABS 11.2*  --   --   --   HGB 12.7 13.3 12.2 11.2*  HCT 39.2 39.0 37.8 33.4*  MCV 94.0  --  92.6 92.5  PLT 152  --  150 725*   Basic Metabolic Panel: Recent Labs  Lab 12/25/21 2202 12/25/21 2214 12/26/21 0652 12/27/21 0221  NA 140 140 138 138  K 3.4* 3.3* 3.5 3.1*  CL 106 103 108 107  CO2 27  --  23 22  GLUCOSE 121* 112* 110* 84  BUN '11 12 10 10  '$ CREATININE 0.54 0.30* 0.51 0.51  CALCIUM 9.1  --  8.6* 8.7*   GFR: Estimated Creatinine Clearance: 30.5 mL/min (by C-G formula based on SCr of  0.51 mg/dL). Liver Function Tests: Recent Labs  Lab 12/25/21 2202 12/26/21 0652 12/27/21 0221  AST 40 63* 28  ALT 18 37 25  ALKPHOS 41 37* 33*  BILITOT 1.0 0.9 1.1  PROT 6.6 5.7* 5.2*  ALBUMIN 3.9 3.4* 2.9*   Recent Labs  Lab 12/25/21 2202  12/26/21 0652 12/27/21 0221  LIPASE 3,580* 1,593* 292*   No results for input(s): "AMMONIA" in the last 168 hours. Coagulation Profile: No results for input(s): "INR", "PROTIME" in the last 168 hours. Cardiac Enzymes: No results for input(s): "CKTOTAL", "CKMB", "CKMBINDEX", "TROPONINI" in the last 168 hours. BNP (last 3 results) No results for input(s): "PROBNP" in the last 8760 hours. HbA1C: No results for input(s): "HGBA1C" in the last 72 hours. CBG: Recent Labs  Lab 12/26/21 0933  GLUCAP 91   Lipid Profile: No results for input(s): "CHOL", "HDL", "LDLCALC", "TRIG", "CHOLHDL", "LDLDIRECT" in the last 72 hours. Thyroid Function Tests: No results for input(s): "TSH", "T4TOTAL", "FREET4", "T3FREE", "THYROIDAB" in the last 72 hours. Anemia Panel: No results for input(s): "VITAMINB12", "FOLATE", "FERRITIN", "TIBC", "IRON", "RETICCTPCT" in the last 72 hours. Sepsis Labs: No results for input(s): "PROCALCITON", "LATICACIDVEN" in the last 168 hours.  No results found for this or any previous visit (from the past 240 hour(s)).       Radiology Studies: US Abdomen Limited RUQ (LIVER/GB)  Result Date: 12/26/2021 CLINICAL DATA:  Epigastric abdominal pain. EXAM: ULTRASOUND ABDOMEN LIMITED RIGHT UPPER QUADRANT COMPARISON:  Abdominopelvic CT earlier today demonstrating choledocholithiasis. FINDINGS: Gallbladder: The gallbladder is prominently distended. The gallstones on prior CT are not well demonstrated on the current exam. Gallbladder wall thickening at 5 mm. Positive sonographic Murphy sign noted by sonographer. Common bile duct: Diameter: 13 mm. The common bile duct stones on CT are not well seen on the current exam. Liver: Mild central intrahepatic biliary ductal dilatation. No focal liver lesion. Portal vein is patent on color Doppler imaging with normal direction of blood flow towards the liver. Other: Small amount of right upper quadrant free fluid. IMPRESSION: 1. Distended  gallbladder with wall thickening and positive sonographic Murphy sign. The gallstones on CT earlier today are not well seen on the current exam. 2. Biliary dilatation, choledocholithiasis on CT not well demonstrated on the current exam. Electronically Signed   By: Keith Rake M.D.   On: 12/26/2021 02:18   CT ABDOMEN PELVIS W CONTRAST  Result Date: 12/26/2021 CLINICAL DATA:  Abdominal pain and vomiting. EXAM: CT ABDOMEN AND PELVIS WITH CONTRAST TECHNIQUE: Multidetector CT imaging of the abdomen and pelvis was performed using the standard protocol following bolus administration of intravenous contrast. RADIATION DOSE REDUCTION: This exam was performed according to the departmental dose-optimization program which includes automated exposure control, adjustment of the mA and/or kV according to patient size and/or use of iterative reconstruction technique. CONTRAST:  152m OMNIPAQUE IOHEXOL 300 MG/ML  SOLN COMPARISON:  01/16/2021. FINDINGS: Lower chest: The heart is enlarged and pacemaker leads are noted in the heart. There are small bilateral pleural effusions. Hepatobiliary: No focal liver abnormality. Intrahepatic and extrahepatic biliary ductal dilatation is noted. The common bile duct measures 1 cm. Multiple stones are present in the distal common bile duct measuring up to 6 mm. Stones are present in the gallbladder. Pancreas: Unremarkable. No pancreatic ductal dilatation or surrounding inflammatory changes. Spleen: The spleen is normal in size. Scattered hypodensities are noted, possibly representing cysts or hemangiomas. Adrenals/Urinary Tract: No adrenal nodule or mass. The kidneys enhance symmetrically. Renal cortical scarring is noted bilaterally. A  stable exophytic cyst is noted in the upper pole the right kidney. No renal calculus or hydronephrosis. The bladder is unremarkable. Stomach/Bowel: No bowel obstruction, free air, or pneumatosis. Gastric wall thickening is noted. Scattered diverticula are  present along the colon without evidence of diverticulitis. The appendix is unremarkable. Mild bowel wall thickening is noted in the jejunum in the pelvis. Vascular/Lymphatic: Aortic atherosclerosis. No enlarged abdominal or pelvic lymph nodes. Reproductive: Status post hysterectomy. No adnexal masses. Other: Mesenteric edema is noted with free fluid at the gallbladder fossa, perihepatic space, and pelvis. Musculoskeletal: Sternotomy wires are present over the midline. Degenerative changes are noted in the thoracolumbar spine. No acute osseous abnormality. IMPRESSION: 1. Increased intrahepatic and extrahepatic biliary ductal dilatation. There is increased distention of the common bile duct measuring 1 cm with choledocholithiasis. Cholelithiasis is also noted. Free fluid is noted at the gallbladder fossa and perihepatic space posteriorly. Correlate clinically to exclude superimposed acute cholecystitis. 2. Jejunal bowel wall thickening in the pelvis, possible infectious or inflammatory enteritis. 3. Diffuse mesenteric edema and a small amount of free fluid in the pelvis. 4. Small bilateral pleural effusions. 5. Gastric wall thickening, possible gastritis. 6. Aortic atherosclerosis. 7. Remaining ancillary findings as detailed above. Electronically Signed   By: Brett Fairy M.D.   On: 12/26/2021 00:54   DG Chest Port 1 View  Result Date: 12/25/2021 CLINICAL DATA:  Shortness of breath. EXAM: PORTABLE CHEST 1 VIEW COMPARISON:  01/16/2021. FINDINGS: The heart size and mediastinal contours are stable. There is atherosclerotic calcification of the aorta. A dual lead pacemaker is present over the left chest. There is blunting of the right costophrenic angle which is unchanged from the prior exam and may represent atelectasis or scarring. The left lung is clear. No pneumothorax. There is evidence of prior cardiothoracic surgery with TAVR stent. IMPRESSION: 1. Blunting of the right costophrenic angle, possibly representing  atelectasis or scarring is compared with previous exams. 2. Otherwise stable chest. Electronically Signed   By: Brett Fairy M.D.   On: 12/25/2021 23:40        Scheduled Meds:  pantoprazole (PROTONIX) IV  40 mg Intravenous Q24H   Continuous Infusions:  lactated ringers 75 mL/hr at 12/27/21 0353   piperacillin-tazobactam (ZOSYN)  IV 3.375 g (12/27/21 0354)   potassium chloride 10 mEq (12/27/21 0825)     LOS: 1 day    Time spent: 49 minutes spent on chart review, discussion with nursing staff, consultants, updating family and interview/physical exam; more than 50% of that time was spent in counseling and/or coordination of care.    Solange Emry J [place] DO [facility] Available via Epic secure chat 7am-7pm After these hours, please refer to coverage provider listed on [email or web address] 12/27/2021, 10:53 AM

## 2021-12-27 NOTE — Progress Notes (Signed)
Subjective: [name] is a 88 year old white female with multiple medical problems including history of CAD status post CABG, paroxysmal atrial fibrillation status post Maze procedure, fibromyalgia, GERD, moderate MR, severe AS status post TAVR, chronic diastolic CHF, hypertension hyperlipidemia and asthma who was admitted for acute abdominal pain was found to have elevated lipase level consistent with gallstone pancreatitis.  She has a history of choledocholithiasis but her LFTs have always been within normal limits and therefore considering her age and comorbidities ERCP had not attempted in the past.  Presently she feels better and abdominal pain has resolved.  She denies have any nausea or vomiting.  She is awaiting an ERCP tomorrow.  Objective: Vital signs in last 24 hours: Temp:  [98.6 F (37 C)-99.5 F (37.5 C)] 99.5 F (37.5 C) (08/07 0813) Pulse Rate:  [70-82] 70 (08/07 0302) Resp:  [19-25] 22 (08/07 0302) BP: (100-128)/(47-68) 121/47 (08/07 0302) SpO2:  [91 %-94 %] 93 % (08/07 0302) Weight:  [41.4 kg] 41.4 kg (08/07 0302) Last BM Date : 12/25/21  Intake/Output from previous day: 08/06 0701 - 08/07 0700 In: 1229.8 [I.V.:1179.8; IV Piggyback:50] Out: -  Intake/Output this shift: No intake/output data recorded.  General appearance: alert, cooperative, appears stated age, fatigued, no distress, and pale Resp: clear to auscultation bilaterally Cardio: regular rate and rhythm, S1, S2 normal, no murmur, click, rub or gallop GI: soft, non-tender; bowel sounds normal; no masses,  no organomegaly Extremities: extremities normal, atraumatic, no cyanosis or edema  Lab Results: Recent Labs    12/25/21 2202 12/25/21 2214 12/26/21 0652 12/27/21 0221  WBC 12.3*  --  13.4* 7.6  HGB 12.7 13.3 12.2 11.2*  HCT 39.2 39.0 37.8 33.4*  PLT 152  --  150 109*   BMET Recent Labs    12/25/21 2202 12/25/21 2214 12/26/21 0652 12/27/21 0221  NA 140 140 138 138  K 3.4* 3.3* 3.5 3.1*  CL  106 103 108 107  CO2 27  --  23 22  GLUCOSE 121* 112* 110* 84  BUN '11 12 10 10  '$ CREATININE 0.54 0.30* 0.51 0.51  CALCIUM 9.1  --  8.6* 8.7*   LFT Recent Labs    12/27/21 0221  PROT 5.2*  ALBUMIN 2.9*  AST 28  ALT 25  ALKPHOS 33*  BILITOT 1.1   PT/INR No results for input(s): "LABPROT", "INR" in the last 72 hours. Hepatitis Panel No results for input(s): "HEPBSAG", "HCVAB", "HEPAIGM", "HEPBIGM" in the last 72 hours. C-Diff No results for input(s): "CDIFFTOX" in the last 72 hours. No results for input(s): "CDIFFPCR" in the last 72 hours. Fecal Lactopherrin No results for input(s): "FECLLACTOFRN" in the last 72 hours.  Studies/Results: US Abdomen Limited RUQ (LIVER/GB)  Result Date: 12/26/2021 CLINICAL DATA:  Epigastric abdominal pain. EXAM: ULTRASOUND ABDOMEN LIMITED RIGHT UPPER QUADRANT COMPARISON:  Abdominopelvic CT earlier today demonstrating choledocholithiasis. FINDINGS: Gallbladder: The gallbladder is prominently distended. The gallstones on prior CT are not well demonstrated on the current exam. Gallbladder wall thickening at 5 mm. Positive sonographic Murphy sign noted by sonographer. Common bile duct: Diameter: 13 mm. The common bile duct stones on CT are not well seen on the current exam. Liver: Mild central intrahepatic biliary ductal dilatation. No focal liver lesion. Portal vein is patent on color Doppler imaging with normal direction of blood flow towards the liver. Other: Small amount of right upper quadrant free fluid. IMPRESSION: 1. Distended gallbladder with wall thickening and positive sonographic Murphy sign. The gallstones on CT earlier today are not  well seen on the current exam. 2. Biliary dilatation, choledocholithiasis on CT not well demonstrated on the current exam. Electronically Signed   By: Keith Rake M.D.   On: 12/26/2021 02:18   CT ABDOMEN PELVIS W CONTRAST  Result Date: 12/26/2021 CLINICAL DATA:  Abdominal pain and vomiting. EXAM: CT ABDOMEN AND  PELVIS WITH CONTRAST TECHNIQUE: Multidetector CT imaging of the abdomen and pelvis was performed using the standard protocol following bolus administration of intravenous contrast. RADIATION DOSE REDUCTION: This exam was performed according to the departmental dose-optimization program which includes automated exposure control, adjustment of the mA and/or kV according to patient size and/or use of iterative reconstruction technique. CONTRAST:  141m OMNIPAQUE IOHEXOL 300 MG/ML  SOLN COMPARISON:  01/16/2021. FINDINGS: Lower chest: The heart is enlarged and pacemaker leads are noted in the heart. There are small bilateral pleural effusions. Hepatobiliary: No focal liver abnormality. Intrahepatic and extrahepatic biliary ductal dilatation is noted. The common bile duct measures 1 cm. Multiple stones are present in the distal common bile duct measuring up to 6 mm. Stones are present in the gallbladder. Pancreas: Unremarkable. No pancreatic ductal dilatation or surrounding inflammatory changes. Spleen: The spleen is normal in size. Scattered hypodensities are noted, possibly representing cysts or hemangiomas. Adrenals/Urinary Tract: No adrenal nodule or mass. The kidneys enhance symmetrically. Renal cortical scarring is noted bilaterally. A stable exophytic cyst is noted in the upper pole the right kidney. No renal calculus or hydronephrosis. The bladder is unremarkable. Stomach/Bowel: No bowel obstruction, free air, or pneumatosis. Gastric wall thickening is noted. Scattered diverticula are present along the colon without evidence of diverticulitis. The appendix is unremarkable. Mild bowel wall thickening is noted in the jejunum in the pelvis. Vascular/Lymphatic: Aortic atherosclerosis. No enlarged abdominal or pelvic lymph nodes. Reproductive: Status post hysterectomy. No adnexal masses. Other: Mesenteric edema is noted with free fluid at the gallbladder fossa, perihepatic space, and pelvis. Musculoskeletal: Sternotomy  wires are present over the midline. Degenerative changes are noted in the thoracolumbar spine. No acute osseous abnormality. IMPRESSION: 1. Increased intrahepatic and extrahepatic biliary ductal dilatation. There is increased distention of the common bile duct measuring 1 cm with choledocholithiasis. Cholelithiasis is also noted. Free fluid is noted at the gallbladder fossa and perihepatic space posteriorly. Correlate clinically to exclude superimposed acute cholecystitis. 2. Jejunal bowel wall thickening in the pelvis, possible infectious or inflammatory enteritis. 3. Diffuse mesenteric edema and a small amount of free fluid in the pelvis. 4. Small bilateral pleural effusions. 5. Gastric wall thickening, possible gastritis. 6. Aortic atherosclerosis. 7. Remaining ancillary findings as detailed above. Electronically Signed   By: LBrett FairyM.D.   On: 12/26/2021 00:54   DG Chest Port 1 View  Result Date: 12/25/2021 CLINICAL DATA:  Shortness of breath. EXAM: PORTABLE CHEST 1 VIEW COMPARISON:  01/16/2021. FINDINGS: The heart size and mediastinal contours are stable. There is atherosclerotic calcification of the aorta. A dual lead pacemaker is present over the left chest. There is blunting of the right costophrenic angle which is unchanged from the prior exam and may represent atelectasis or scarring. The left lung is clear. No pneumothorax. There is evidence of prior cardiothoracic surgery with TAVR stent. IMPRESSION: 1. Blunting of the right costophrenic angle, possibly representing atelectasis or scarring is compared with previous exams. 2. Otherwise stable chest. Electronically Signed   By: LBrett FairyM.D.   On: 12/25/2021 23:40    Medications: I have reviewed the patient's current medications. Prior to Admission:  Medications Prior to Admission  Medication Sig Dispense Refill Last Dose   acetaminophen (TYLENOL) 650 MG CR tablet Take 650-1,300 mg by mouth 2 (two) times daily as needed for pain.    12/24/2021   apixaban (ELIQUIS) 2.5 MG TABS tablet TAKE 1 TABLET(2.5 MG) BY MOUTH TWICE DAILY (Patient taking differently: Take 2.5 mg by mouth 2 (two) times daily.) 60 tablet 5 12/25/2021 at 1900   Carboxymethylcellulose Sodium (EYE DROPS OP) Apply 2 drops to eye daily as needed (gritty eyes).   Past Week   Cholecalciferol (VITAMIN D-3) 25 MCG (1000 UT) CAPS Take 1,000 Units by mouth daily.   12/25/2021   docusate sodium (COLACE) 100 MG capsule Take 100 mg by mouth daily.   12/25/2021   Ensure (ENSURE) Take 237 mLs by mouth every other day.   12/24/2021   fluticasone (FLONASE) 50 MCG/ACT nasal spray SHAKE LIQUID AND USE 2 SPRAYS IN EACH NOSTRIL EVERY DAY (Patient taking differently: Place 2 sprays into both nostrils daily.) 48 g 0 Past Week   fluticasone (FLOVENT HFA) 220 MCG/ACT inhaler INHALE 2 PUFFS INTO THE LUNGS TWICE DAILY (Patient taking differently: Inhale 2 puffs into the lungs daily in the afternoon.) 12 g 0 12/24/2021   guaiFENesin (MUCINEX) 600 MG 12 hr tablet Take 1 tablet (600 mg total) by mouth 2 (two) times daily. 30 tablet 0 12/25/2021   LINZESS 72 MCG capsule Take 1 capsule (72 mcg total) by mouth daily. 30 capsule 0 12/25/2021   loratadine (CLARITIN) 10 MG tablet Take 1 tablet (10 mg total) by mouth daily. 30 tablet 0 12/25/2021   nitroGLYCERIN (NITROSTAT) 0.4 MG SL tablet DISSOLVE 1 TABLET UNDER THE TONGUE EVERY 5 MINUTES AS NEEDED FOR CHEST PAIN (Patient taking differently: Place 0.4 mg under the tongue every 5 (five) minutes as needed for chest pain.) 75 tablet 2 Past Week   OVER THE COUNTER MEDICATION Take by mouth daily as needed (cough). Guaifenesin liquid PO 1 sip by mouth daily as needed for cough   12/24/2021   pantoprazole (PROTONIX) 40 MG tablet TAKE 1 TABLET BY MOUTH TWICE DAILY (Patient taking differently: Take 40 mg by mouth 2 (two) times daily.) 180 tablet 1 12/25/2021   rosuvastatin (CRESTOR) 40 MG tablet TAKE 1 TABLET(40 MG) BY MOUTH DAILY (Patient taking differently: Take 40 mg by  mouth at bedtime.) 90 tablet 0 12/25/2021   VENTOLIN HFA 108 (90 Base) MCG/ACT inhaler INHALE 2 PUFFS INTO THE LUNGS EVERY 6 HOURS AS NEEDED FOR WHEEZING OR SHORTNESS OF BREATH (Patient taking differently: Inhale 1-2 puffs into the lungs as needed for shortness of breath or wheezing.) 18 g 0 12/25/2021   Wheat Dextrin (BENEFIBER PO) Take 1 Scoop by mouth daily as needed (fiber).   Past Month   polyethylene glycol powder (MIRALAX) 17 GM/SCOOP powder Take 17 g by mouth 2 (two) times daily as needed for moderate constipation. (Patient not taking: Reported on 10/07/2021) 255 g 0 Not Taking   Scheduled:  pantoprazole (PROTONIX) IV  40 mg Intravenous Q24H   Continuous:  lactated ringers 75 mL/hr at 12/27/21 0353   piperacillin-tazobactam (ZOSYN)  IV 3.375 g (12/27/21 1150)   Assessment/Plan: 1) Acute cholecystitis, choledocholithiasis, gallstone pancreatitis-plan to proceed with a ERCP tomorrow. 2) Elevated troponins probably secondary to demand ischemia. 3) CAD status post CABG. 4) Paroxysmal atrial fibrillation status post Maze procedure. 5) Carotid artery stenosis status post carotid endarterectomy. 6) History of severe aortic stenosis status post TAVR. 7) Chronic diastolic congestive heart failure/Hypertension/SSS s/p PPM. 8) GERD.  LOS: 1 day  Juanita Craver 12/27/2021, 11:56 AM

## 2021-12-27 NOTE — Consult Note (Addendum)
Cardiology Consultation:   Patient ID: [name] MRN: [id number]; DOB: 29-Apr-1929  Admit date: 12/25/2021 Date of Consult: 12/27/2021  PCP:  Lori Lung, MD   [name] Providers Cardiologist:  Lori Chandler, MD  Electrophysiologist:  Lori Meredith Leeds, MD    Patient Profile:   [name] is a 88 y.o. female with a hx of CAD s/p CABG, paroxysmal atrial fibrillation s/p MAZE, CAS s/p CEA, fibromyalgia, GERD, moderate MR, severe AS s/p TAVR, sss s/p PPM, chronic diastolic CHF, HTN, HLD, asthma who is being seen 12/27/2021 for the evaluation of preoperative evaluation at the request of [name].  History of Present Illness:   [name] is a 88 year old female with above medical history who is followed by [name] and [name].  Per chart review, patient has a long history of coronary artery disease and atrial fibrillation.  Underwent CABG with LIMA-D1, SVG-LAD, SGV-OM, SVG-PDA in 01/2005. Also underwent MAZE procedure at that time.  Reportedly underwent a carotid endarterectomy in 2010  Later, in 10/2016, patient was noted to have progression of aortic stenosis.  For further evaluation, patient had a Right/Left Heart catheterization that showed severe triple-vessel CAD s/p 4V CABG, 3/4 bypass grafts were patent ([place] to diagonal was atretic.  Also showed severe aortic stenosis.  Patient was referred to the TAVR team, underwent TAVR on 01/03/2017.  Follow-up echocardiogram after TAVR on 02/06/2017 showed EF 60-65%, no regional wall motion abnormalities, TAVR prosthetic valve present without regurgitation, mild MR, moderate TR.   It was noted that patient developed LBBB after TAVR.  Wore a 48-hour Holter monitor on 02/10/2017 that showed episodes of sinus bradycardia, transient heart block.  Patient was referred to EP for further evaluation.  EP recommended stopping diltiazem and transitioning to low-dose metoprolol.   Patient was admitted to the [name] in 12/2020 after  patient had a syncopal episode.  Patient was noted to have early satiety, weight loss, deconditioning, possible failure to thrive.  Despite these issues, patient continued to be full code.  Noted that she had multiple 6+ second pauses while in A-fib.  Underwent echocardiogram on 01/15/2021 that showed EF 55-60%, mild LVH, mildly reduced RV systolic function, mild-moderate MR, mild-moderate TR, normally functioning bioprosthetic AVR.  Underwent pacemaker implantation on 01/15/2021 with a [facility] pacemaker.  Patient was last seen by cardiology for an outpatient follow-up appointment on 08/16/2021.  At that time, patient denies any chest pain, dyspnea, palpitations, lower extremity edema, orthopnea, dizziness, near-syncope/syncope.  Patient presented to the ED on 8/5 complaining of upper quadrant abdominal pain, nausea, diarrhea for the past day. Labs in the ED showed WBC 12.3, hgb 12.7, platelets 152, Na 140, K 3.4, creatinine 0.54, lipase 3580. hsTn 19>>23>>54>>53. BNP elevated to 291.9. CXR showed blunting of the right costophrenic angle. CT abdomen pelvis showed increased intrahepatic/extrahepatic biliary ductal dilation, increased distention of the common bile duct measuring 1 cm with choledocholithiasis, free fluid gallbladder fossa and perihepatic space.  Right upper quadrant ultrasound showed distended gallbladder, gallbladder wall thickening, positive sonographic Murphy sign.  Patient was admitted to the Hospitalist Service for treatment of presumed biliary pancreatitis, choledocholithiasis, possible cholecystitis. General surgery was consulted, requested cardiology evaluation prior to surgical discussion with family. IR was consulted, appears patient may have gallstone pancreatitis with secondary pericholecystic inflammatory changes. IR recommends seeing how patient responds to Abx and ERCP prior to considering perc chole. GI planning ERCP today, suspect she has already passed the inciting stone.   On  interview, patient  reports that she has been in a wheelchair for the past 2 years due to weakness in her legs. She is able to mostly push herself around her home, but she is very inactive. Reports occasional SOB. Unsure if it is from her CHF, chronic bronchitis, or asthma. Has sob while wheeling herself around her home, but also while at rest. Does have orthopnea and sleeps on 2 pillows per night. Has infrequent episodes of chest pain that resolve with nitro. Denies dizziness, syncope/presyncope.    Past Medical History:  Diagnosis Date   Anxiety    Aortic Stenosis s/p TAVR    Echo 10/21: EF 55-60, no RWMA, mild asymmetric LVH, normal RVSF, RVSP 40.2 mmHg, mild MR, s/p TAVR with mean gradient 8.107mHg, no PVL   Arthritis    OSTEO   Aspirin allergy    on Plavix   Asthma    Bronchitis    CAD (coronary artery disease)    a. s/p CABG 2006 with Cox-Maze procedure.   Car[place]id artery [place] ([place]    a. s/p R CEA.   Chronic stable angina (HCC)    Diastolic dysfunction    Eczema    Fibromyalgia    GERD (gastroesophageal reflux disease)    Heart murmur    Hiatal hernia    Hyperlipidemia    Hypertension    Kyphoscoliosis    Mitral regurgitation    Myocardial infarction ([facility] 2003   Stent to CFX   PAF (paroxysmal atrial fibrillation) ([place]    a. pt has h/o hematuria on Eliquis and has since refused anticoagulation   Pneumonia 2015 ?   Pulmonary regurgitation    Skin cancer of arm    Stroke ([facility]    Tricuspid regurgitation     Past Surgical History:  Procedure Laterality Date   ABDOMINAL HYSTERECTOMY  1976   CAROTID ENDARTERECTOMY  2010   CORONARY ANGIOPLASTY WITH STENT PLACEMENT     CORONARY ARTERY BYPASS GRAFT  01/2005   LIMA-D1; SVG-LAD; SVG-OM; SVG-PDA   ESOPHAGOGASTRODUODENOSCOPY (EGD) WITH PROPOFOL N/A 03/19/2021   Procedure: ESOPHAGOGASTRODUODENOSCOPY (EGD) WITH PROPOFOL;  Surgeon: HCarol Ada MD;  [facility];  Service: Endoscopy;  Laterality: N/A;   EYE  SURGERY     MULTIPLE EXTRACTIONS WITH ALVEOLOPLASTY  12/28/2016   Extraction of tooth #'s 2- 5,7-10, 12,13,17-20,and 22-29 with alveoloplasty and maxillary right and left lateral exostoses reductions   MULTIPLE EXTRACTIONS WITH ALVEOLOPLASTY N/A 12/28/2016   Procedure: Extraction of tooth #'s 2- 5,7-10, 12,13,17-20,and 22-29 with alveoloplasty and maxillary right and left lateral exostoses reductions;  Surgeon: KLenn Cal DDS;  [place] [facility]  Service: Oral Surgery;  Laterality: N/A;   PACEMAKER IMPLANT N/A 01/15/2021   Procedure: PACEMAKER IMPLANT;  Surgeon: CConstance Haw MD;  [place];  Service: Cardiovascular;  Laterality: N/A;   RIGHT/LEFT HEART CATH AND CORONARY/GRAFT ANGIOGRAPHY N/A 10/27/2016   Procedure: Right/Left Heart Cath and Coronary/Graft Angiography;  Surgeon: MBurnell Blanks MD;  [facility];  Service: Cardiovascular;  Laterality: N/A;   SAVORY DILATION N/A 03/19/2021   Procedure: SAVORY DILATION;  Surgeon: HCarol Ada MD;  [facility];  Service: Endoscopy;  Laterality: N/A;   TEE WITHOUT CARDIOVERSION N/A 01/03/2017   Procedure: TRANSESOPHAGEAL ECHOCARDIOGRAM (TEE);  Surgeon: MBurnell Blanks MD;  [place] [facility]  Service: Open Heart Surgery;  Laterality: N/A;   TONSILLECTOMY     TRANSCATHETER AORTIC VALVE REPLACEMENT, TRANSFEMORAL N/A 01/03/2017   Procedure: TRANSCATHETER AORTIC VALVE REPLACEMENT, TRANSFEMORAL;  Surgeon:  Burnell Blanks, MD;  Location: [place];  Service: Open Heart Surgery;  Laterality: N/A;   TUMOR REMOVAL       Home Medications:  Prior to Admission medications   Medication Sig Start Date End Date Taking? Authorizing Provider  acetaminophen (TYLENOL) 650 MG CR tablet Take 650-1,300 mg by mouth 2 (two) times daily as needed for pain.   Yes [provider]  apixaban (ELIQUIS) 2.5 MG TABS tablet TAKE 1 TABLET(2.5 MG) BY MOUTH TWICE DAILY Patient taking differently:  Take 2.5 mg by mouth 2 (two) times daily. 08/16/21  Yes Burnell Blanks, MD  Carboxymethylcellulose Sodium (EYE DROPS OP) Apply 2 drops to eye daily as needed (gritty eyes).   Yes [provider]  Cholecalciferol (VITAMIN D-3) 25 MCG (1000 UT) CAPS Take 1,000 Units by mouth daily.   Yes [provider]  docusate sodium (COLACE) 100 MG capsule Take 100 mg by mouth daily.   Yes [provider]  Ensure (ENSURE) Take 237 mLs by mouth every other day.   Yes [provider]  fluticasone (FLONASE) 50 MCG/ACT nasal spray SHAKE LIQUID AND USE 2 SPRAYS IN EACH NOSTRIL EVERY DAY Patient taking differently: Place 2 sprays into both nostrils daily. 02/10/21  Yes Medina-Vargas, Monina C, NP  fluticasone (FLOVENT HFA) 220 MCG/ACT inhaler INHALE 2 PUFFS INTO THE LUNGS TWICE DAILY Patient taking differently: Inhale 2 puffs into the lungs daily in the afternoon. 07/14/21  Yes Lori Lung, MD  guaiFENesin (MUCINEX) 600 MG 12 hr tablet Take 1 tablet (600 mg total) by mouth 2 (two) times daily. 05/25/14  Yes Regalado, Belkys A, MD  LINZESS 72 MCG capsule Take 1 capsule (72 mcg total) by mouth daily. 02/10/21  Yes Medina-Vargas, Monina C, NP  loratadine (CLARITIN) 10 MG tablet Take 1 tablet (10 mg total) by mouth daily. 02/10/21  Yes Medina-Vargas, Monina C, NP  nitroGLYCERIN (NITROSTAT) 0.4 MG SL tablet DISSOLVE 1 TABLET UNDER THE TONGUE EVERY 5 MINUTES AS NEEDED FOR CHEST PAIN Patient taking differently: Place 0.4 mg under the tongue every 5 (five) minutes as needed for chest pain. 11/01/21  Yes Burnell Blanks, MD  ondansetron (ZOFRAN-ODT) 4 MG disintegrating tablet '4mg'$  ODT q4 hours prn nausea/vomit 12/25/21  Yes Drenda Freeze, MD  OVER THE COUNTER MEDICATION Take by mouth daily as needed (cough). Guaifenesin liquid PO 1 sip by mouth daily as needed for cough   Yes [provider]  pantoprazole (PROTONIX) 40 MG tablet TAKE 1 TABLET BY MOUTH TWICE  DAILY Patient taking differently: Take 40 mg by mouth 2 (two) times daily. 07/08/21  Yes Lori Lung, MD  rosuvastatin (CRESTOR) 40 MG tablet TAKE 1 TABLET(40 MG) BY MOUTH DAILY Patient taking differently: Take 40 mg by mouth at bedtime. 12/07/21  Yes Lori Lung, MD  VENTOLIN HFA 108 (90 Base) MCG/ACT inhaler INHALE 2 PUFFS INTO THE LUNGS EVERY 6 HOURS AS NEEDED FOR WHEEZING OR SHORTNESS OF BREATH Patient taking differently: Inhale 1-2 puffs into the lungs as needed for shortness of breath or wheezing. 07/15/21  Yes Lori Lung, MD  Wheat Dextrin (BENEFIBER PO) Take 1 Scoop by mouth daily as needed (fiber).   Yes [provider]  polyethylene glycol powder (MIRALAX) 17 GM/SCOOP powder Take 17 g by mouth 2 (two) times daily as needed for moderate constipation. Patient not taking: Reported on 10/07/2021 02/10/21   Medina-Vargas, Senaida Lange, NP    Inpatient Medications: Scheduled Meds:  pantoprazole (PROTONIX) IV  40  mg Intravenous Q24H   Continuous Infusions:  lactated ringers 75 mL/hr at 12/27/21 0353   piperacillin-tazobactam (ZOSYN)  IV 3.375 g (12/27/21 1150)   potassium chloride 10 mEq (12/27/21 1138)   PRN Meds: acetaminophen **OR** acetaminophen, albuterol, fentaNYL (SUBLIMAZE) injection, ondansetron **OR** ondansetron (ZOFRAN) IV, oxyCODONE  Allergies:    Allergies  Allergen Reactions   Other Other (See Comments)    NO ACIDIC, TART< OR SPICY FOODS- DEVELOPS REFLUX OFTEN!!  PATIENT HAS TROUBLE SWALLOWING TABLETS!!   Bactrim [Sulfamethoxazole-Trimethoprim] Other (See Comments)    "makes her feel funny" or "unsteady"    Amitriptyline Hcl Rash   Aspirin Hives, Swelling, Rash and Other (See Comments)    Body became swollen   Tape Other (See Comments)    SKIN IS VERY THIN- Lori TEAR EASILY!!   Zetia [Ezetimibe] Rash    Social History:   Social History   Socioeconomic History   Marital status: Widowed    Spouse name: Not on file   Number of children: 3    Years of education: Not on file   Highest education level: Not on file  Occupational History    Employer: RETIRED  Tobacco Use   Smoking status: Never   Smokeless tobacco: Former    Types: Snuff    Quit date: 05/23/2001   Tobacco comments:    Former Snuff (02/04/2021)  Vaping Use   Vaping Use: Never used  Substance and Sexual Activity   Alcohol use: No   Drug use: No   Sexual activity: Not Currently  Other Topics Concern   Not on file  Social History Narrative   Not on file   Social Determinants of Health   Financial Resource Strain: Low Risk  (04/09/2021)   Overall Financial Resource Strain (CARDIA)    Difficulty of Paying Living Expenses: Not hard at all  Food Insecurity: No Food Insecurity (04/09/2021)   Hunger Vital Sign    Worried About Running Out of Food in the Last Year: Never true    [place] in the Last Year: Never true  Transportation Needs: No Transportation Needs (04/09/2021)   PRAPARE - Hydrologist (Medical): No    Lack of Transportation (Non-Medical): No  Physical Activity: Insufficiently Active (04/09/2021)   Exercise Vital Sign    Days of Exercise per Week: 2 days    Minutes of Exercise per Session: 10 min  Stress: No Stress Concern Present (04/09/2021)   [place]    Feeling of Stress : Not at all  Social Connections: Not on file  Intimate Partner Violence: Not on file    Family History:    Family History  Problem Relation Age of Onset   Heart failure Mother    Other Father      ROS:  Please see the history of present illness.   All other ROS reviewed and negative.     Physical Exam/Data:   Vitals:   12/26/21 1923 12/26/21 2302 12/27/21 0302 12/27/21 0813  BP: (!) 105/53 128/68 (!) 121/47   Pulse: 70 70 70   Resp: 20 19 (!) 22   Temp: 98.6 F (37 C) 99 F (37.2 C) 99 F (37.2 C) 99.5 F (37.5 C)  TempSrc: Oral Oral Oral Oral   SpO2: 94% 93% 93%   Weight:   41.4 kg     Intake/Output Summary (Last 24 hours) at 12/27/2021 1154 Last data filed at 12/26/2021 1800 Gross per  24 hour  Intake 1229.78 ml  Output --  Net 1229.78 ml      12/27/2021    3:02 AM 12/26/2021    5:00 AM 10/07/2021    3:16 PM  Last 3 Weights  Weight (lbs) 91 lb 4.3 oz 90 lb 6.2 oz 75 lb 3.2 oz  Weight (kg) 41.4 kg 41 kg 34.11 kg     Body mass index is 16.69 kg/m.  General:  Frail, chronically ill appearing female in no acute distress  HEENT: normal Neck: no JVD Vascular: Radial pulses 2+ bilaterally Cardiac:  normal S1, S2; RRR; grade 1/6 systolic murmur at upper sternal border Lungs:  clear to auscultation bilaterally, no wheezing, rhonchi or rales  Abd: soft, nontender, no hepatomegaly  Ext: no edema Musculoskeletal:  No deformities, BUE and BLE strength normal and equal Skin: warm and dry  Neuro:  CNs 2-12 intact, no focal abnormalities noted Psych:  Normal affect   EKG:  The EKG was personally reviewed and demonstrates:  Atrial fibrillation, V-paced  Telemetry:  Telemetry was personally reviewed and demonstrates:  Predominantly V paced, occasionally A-V paced   Relevant CV Studies:   Laboratory Data:  High Sensitivity Troponin:   Recent Labs  Lab 12/25/21 2202 12/26/21 0040 12/26/21 0443 12/26/21 0652  TROPONINIHS 19* 23* 54* 53*     Chemistry Recent Labs  Lab 12/25/21 2202 12/25/21 2214 12/26/21 0652 12/27/21 0221  NA 140 140 138 138  K 3.4* 3.3* 3.5 3.1*  CL 106 103 108 107  CO2 27  --  23 22  GLUCOSE 121* 112* 110* 84  BUN '11 12 10 10  '$ CREATININE 0.54 0.30* 0.51 0.51  CALCIUM 9.1  --  8.6* 8.7*  GFRNONAA >60  --  >60 >60  ANIONGAP 7  --  7 9    Recent Labs  Lab 12/25/21 2202 12/26/21 0652 12/27/21 0221  PROT 6.6 5.7* 5.2*  ALBUMIN 3.9 3.4* 2.9*  AST 40 63* 28  ALT 18 37 25  ALKPHOS 41 37* 33*  BILITOT 1.0 0.9 1.1   Lipids No results for input(s): "CHOL", "TRIG", "HDL", "LABVLDL", "LDLCALC",  "CHOLHDL" in the last 168 hours.  Hematology Recent Labs  Lab 12/25/21 2202 12/25/21 2214 12/26/21 0652 12/27/21 0221  WBC 12.3*  --  13.4* 7.6  RBC 4.17  --  4.08 3.61*  HGB 12.7 13.3 12.2 11.2*  HCT 39.2 39.0 37.8 33.4*  MCV 94.0  --  92.6 92.5  MCH 30.5  --  29.9 31.0  MCHC 32.4  --  32.3 33.5  RDW 14.6  --  14.8 14.9  PLT 152  --  150 109*   Thyroid No results for input(s): "TSH", "FREET4" in the last 168 hours.  BNP Recent Labs  Lab 12/26/21 0443  BNP 291.9*    DDimer No results for input(s): "DDIMER" in the last 168 hours.   Radiology/Studies:  US Abdomen Limited RUQ (LIVER/GB)  Result Date: 12/26/2021 CLINICAL DATA:  Epigastric abdominal pain. EXAM: ULTRASOUND ABDOMEN LIMITED RIGHT UPPER QUADRANT COMPARISON:  Abdominopelvic CT earlier today demonstrating choledocholithiasis. FINDINGS: Gallbladder: The gallbladder is prominently distended. The gallstones on prior CT are not well demonstrated on the current exam. Gallbladder wall thickening at 5 mm. Positive sonographic Murphy sign noted by sonographer. Common bile duct: Diameter: 13 mm. The common bile duct stones on CT are not well seen on the current exam. Liver: Mild central intrahepatic biliary ductal dilatation. No focal liver lesion. Portal vein is patent on color Doppler imaging  with normal direction of blood flow towards the liver. Other: Small amount of right upper quadrant free fluid. IMPRESSION: 1. Distended gallbladder with wall thickening and positive sonographic Murphy sign. The gallstones on CT earlier today are not well seen on the current exam. 2. Biliary dilatation, choledocholithiasis on CT not well demonstrated on the current exam. Electronically Signed   By: Keith Rake M.D.   On: 12/26/2021 02:18   CT ABDOMEN PELVIS W CONTRAST  Result Date: 12/26/2021 CLINICAL DATA:  Abdominal pain and vomiting. EXAM: CT ABDOMEN AND PELVIS WITH CONTRAST TECHNIQUE: Multidetector CT imaging of the abdomen and pelvis was  performed using the standard protocol following bolus administration of intravenous contrast. RADIATION DOSE REDUCTION: This exam was performed according to the departmental dose-optimization program which includes automated exposure control, adjustment of the mA and/or kV according to patient size and/or use of iterative reconstruction technique. CONTRAST:  152m OMNIPAQUE IOHEXOL 300 MG/ML  SOLN COMPARISON:  01/16/2021. FINDINGS: Lower chest: The heart is enlarged and pacemaker leads are noted in the heart. There are small bilateral pleural effusions. Hepatobiliary: No focal liver abnormality. Intrahepatic and extrahepatic biliary ductal dilatation is noted. The common bile duct measures 1 cm. Multiple stones are present in the distal common bile duct measuring up to 6 mm. Stones are present in the gallbladder. Pancreas: Unremarkable. No pancreatic ductal dilatation or surrounding inflammatory changes. Spleen: The spleen is normal in size. Scattered hypodensities are noted, possibly representing cysts or hemangiomas. Adrenals/Urinary Tract: No adrenal nodule or mass. The kidneys enhance symmetrically. Renal cortical scarring is noted bilaterally. A stable exophytic cyst is noted in the upper pole the right kidney. No renal calculus or hydronephrosis. The bladder is unremarkable. Stomach/Bowel: No bowel obstruction, free air, or pneumatosis. Gastric wall thickening is noted. Scattered diverticula are present along the colon without evidence of diverticulitis. The appendix is unremarkable. Mild bowel wall thickening is noted in the jejunum in the pelvis. Vascular/Lymphatic: Aortic atherosclerosis. No enlarged abdominal or pelvic lymph nodes. Reproductive: Status post hysterectomy. No adnexal masses. Other: Mesenteric edema is noted with free fluid at the gallbladder fossa, perihepatic space, and pelvis. Musculoskeletal: Sternotomy wires are present over the midline. Degenerative changes are noted in the  thoracolumbar spine. No acute osseous abnormality. IMPRESSION: 1. Increased intrahepatic and extrahepatic biliary ductal dilatation. There is increased distention of the common bile duct measuring 1 cm with choledocholithiasis. Cholelithiasis is also noted. Free fluid is noted at the gallbladder fossa and perihepatic space posteriorly. Correlate clinically to exclude superimposed acute cholecystitis. 2. Jejunal bowel wall thickening in the pelvis, possible infectious or inflammatory enteritis. 3. Diffuse mesenteric edema and a small amount of free fluid in the pelvis. 4. Small bilateral pleural effusions. 5. Gastric wall thickening, possible gastritis. 6. Aortic atherosclerosis. 7. Remaining ancillary findings as detailed above. Electronically Signed   By: LBrett FairyM.D.   On: 12/26/2021 00:54   DG Chest Port 1 View  Result Date: 12/25/2021 CLINICAL DATA:  Shortness of breath. EXAM: PORTABLE CHEST 1 VIEW COMPARISON:  01/16/2021. FINDINGS: The heart size and mediastinal contours are stable. There is atherosclerotic calcification of the aorta. A dual lead pacemaker is present over the left chest. There is blunting of the right costophrenic angle which is unchanged from the prior exam and may represent atelectasis or scarring. The left [name] is clear. No pneumothorax. There is evidence of prior cardiothoracic surgery with TAVR stent. IMPRESSION: 1. Blunting of the right costophrenic angle, possibly representing atelectasis or scarring is compared with previous exams.  2. Otherwise stable chest. Electronically Signed   By: Brett Fairy M.D.   On: 12/25/2021 23:40     Assessment and Plan:   Preoperative Evaluation  - Per revised cardiac index, patient is a class IV risk (due to elevated risk surgery, history of ischemic heart disease, history of CHF, history of stroke). This inditated a 15.0% thirty-day risk of death, MI, or cardiac arrest. - This risk index does not include patient's advanced age (88 years  old) or her frailty/severe protein calorie malnutrition (BMI 16/7). Both of these factors also significantly increase her risk of perioperative complications and poor outcomes  - Patient is wheelchair bound-- difficult to assess her activity levels. Can push herself around her home, but she admits to being very inactive  - Consider repeating echocardiogram as she has not had one for the past year, but I do not think any cardiology interventions Lori mitigate her risks. Lori discuss with MD   CAD s/p CABG - Patient underwent CABG x4 in 2006. Most recent heart catheterization in 2018 showed severed triple vessel CAD s/p 4V CABG with 3/4 patent bypass grafts (LIMA to Diag was atretic)  - Not on ASA due to use of DOAC  - Not on BB due to history of SSS - On crestor, held as patient is NPO   Paroxysmal Atrial Fibrillation s/p MAZE 2006 - Eliquis 2.5 mg BID held prior to procedures  - CHADS-VASc 8, consider IV heparin  - Per telemetry, patient is predominantly in a v-paced rhythm   Sick Sinus Syndrome S/p ppm 2022  - Patient had a [employer] PPM in 12/2020  - Last device check in 09/2021 showed normal device function   Chronic diastolic heart failure  - Most recent echocardiogram from 01/15/2021 showed EF 55-60%, mild LVH, mildly reduced RV systolic function, mild-moderate MR, mild-moderate TR, normally functioning bioprosthetic AVR  - Appears euvolemic on exam, does have some orthopnea  - Consider echo as above   Severe AS s/p TAVR 2018 - Most recent echo from 01/15/2021 showed normally functioning bioprosthetic AVR   Carotid artery stenosis - Reportedly had R carotid endarterectomy in 2010 - Most recent carotid artery duplex in 2021 showed 1-39% stenosis in the right ICA, 1-39% stenosis in the left ICA      Risk Assessment/Risk Scores:   New York Heart Association (NYHA) Functional Class NYHA Class II  CHA2DS2-VASc Score = 8  This indicates a 10.8% annual risk of stroke. The patient's  score is based upon: CHF History: 1 HTN History: 1 Diabetes History: 0 Stroke History: 2 Vascular Disease History: 1 Age Score: 2 Gender Score: 1        For questions or updates, please contact [place] Please consult [email or web address] for contact info under    Signed, Margie Billet, PA-C  12/27/2021 11:54 AM  Patient seen and examined and agree with Vikki Ports, PA-C as detailed above.  In brief, the patient is a 88 year old female with history of CAD s/p CABG, paroxysmal atrial fibrillation s/p MAZE, CAS s/p CEA, fibromyalgia, GERD, moderate MR, severe AS s/p TAVR, sss s/p PPM, chronic diastolic CHF, HTN, HLD, and asthma who presented with RUQ pain, nausea and diarrhea found to have gallstone pancreatitis. Cardiology is now consulted for pre-operative evaluation.  Patient with extensive cardiac history including known CAD s/p CABG with LIMA-D1 (now atretic on cath in 2018), SVG-LAD, SGV-OM, SVG-PDA in 01/2005, severe AS s/p TAVR in 2018, and sick sinus syndrome s/p PPM placement  in 12/2020. She is currently minimally mobile and wheelchair bound. She has SOB with exertion and occasional at rest. She does have episodes of chest pain that are relatively infrequent and resolve with nitro. She is notably frail on exam with muscle wasting.  Overall, patient is high risk for any surgical intervention due to age, frailty and medical comorbidities. She sounds like she has chronic stable angina that is unchanged. Lori check TTE to assess EF and TAVR to help medically optimize going forward. No plans for stress testing at this time. Even if TTE grossly abnormal, I am not sure she is a great cath candidate given significant frailty and advanced age and would likely opt for medical therapy.   GEN: Frail, thin, elderly female sitting up in bed Neck: No JVD Cardiac: RRR, 2/6 systolic murmur Respiratory: Clear to auscultation bilaterally. GI: Soft, nontender, non-distended  MS: Thin. No  edema; No deformity. Neuro:  Nonfocal  Psych: Normal affect    Plan: -Patient is high risk for surgery given advanced age, comorbid conditions, and frailty -Check TTE -Even if TTE abnormal, likely not a great cath candidate due to frailty and comorbidities as above and would favor continued medical management -Apixaban being held pending ERCP and possible plans for surgery -Management of gallstone pancreatitis per primary, surgery and GI  Gwyndolyn Kaufman, MD

## 2021-12-27 NOTE — Social Work (Signed)
  Transition of Care [facility]) Screening Note   Patient Details  Name: [name] Date of Birth: 09/15/1928   Transition of Care [facility]) CM/SW Contact:    Vinie Sill, LCSW Phone Number: 12/27/2021, 10:05 AM    Transition of Care Department [facility]) has reviewed patient and no TOC needs have been identified at this time. We will continue to monitor patient advancement through interdisciplinary progression rounds. If new patient transition needs arise, please place a TOC consult.

## 2021-12-27 NOTE — H&P (View-Only) (Signed)
Subjective: [name] is a 88 year old white female with multiple medical problems including history of CAD status post CABG, paroxysmal atrial fibrillation status post Maze procedure, fibromyalgia, GERD, moderate MR, severe AS status post TAVR, chronic diastolic CHF, hypertension hyperlipidemia and asthma who was admitted for acute abdominal pain was found to have elevated lipase level consistent with gallstone pancreatitis.  She has a history of choledocholithiasis but her LFTs have always been within normal limits and therefore considering her age and comorbidities ERCP had not attempted in the past.  Presently she feels better and abdominal pain has resolved.  She denies have any nausea or vomiting.  She is awaiting an ERCP tomorrow.  Objective: Vital signs in last 24 hours: Temp:  [98.6 F (37 C)-99.5 F (37.5 C)] 99.5 F (37.5 C) (08/07 0813) Pulse Rate:  [70-82] 70 (08/07 0302) Resp:  [19-25] 22 (08/07 0302) BP: (100-128)/(47-68) 121/47 (08/07 0302) SpO2:  [91 %-94 %] 93 % (08/07 0302) Weight:  [41.4 kg] 41.4 kg (08/07 0302) Last BM Date : 12/25/21  Intake/Output from previous day: 08/06 0701 - 08/07 0700 In: 1229.8 [I.V.:1179.8; IV Piggyback:50] Out: -  Intake/Output this shift: No intake/output data recorded.  General appearance: alert, cooperative, appears stated age, fatigued, no distress, and pale Resp: clear to auscultation bilaterally Cardio: regular rate and rhythm, S1, S2 normal, no murmur, click, rub or gallop GI: soft, non-tender; bowel sounds normal; no masses,  no organomegaly Extremities: extremities normal, atraumatic, no cyanosis or edema  Lab Results: Recent Labs    12/25/21 2202 12/25/21 2214 12/26/21 0652 12/27/21 0221  WBC 12.3*  --  13.4* 7.6  HGB 12.7 13.3 12.2 11.2*  HCT 39.2 39.0 37.8 33.4*  PLT 152  --  150 109*   BMET Recent Labs    12/25/21 2202 12/25/21 2214 12/26/21 0652 12/27/21 0221  NA 140 140 138 138  K 3.4* 3.3* 3.5 3.1*  CL  106 103 108 107  CO2 27  --  23 22  GLUCOSE 121* 112* 110* 84  BUN '11 12 10 10  '$ CREATININE 0.54 0.30* 0.51 0.51  CALCIUM 9.1  --  8.6* 8.7*   LFT Recent Labs    12/27/21 0221  PROT 5.2*  ALBUMIN 2.9*  AST 28  ALT 25  ALKPHOS 33*  BILITOT 1.1   PT/INR No results for input(s): "LABPROT", "INR" in the last 72 hours. Hepatitis Panel No results for input(s): "HEPBSAG", "HCVAB", "HEPAIGM", "HEPBIGM" in the last 72 hours. C-Diff No results for input(s): "CDIFFTOX" in the last 72 hours. No results for input(s): "CDIFFPCR" in the last 72 hours. Fecal Lactopherrin No results for input(s): "FECLLACTOFRN" in the last 72 hours.  Studies/Results: US Abdomen Limited RUQ (LIVER/GB)  Result Date: 12/26/2021 CLINICAL DATA:  Epigastric abdominal pain. EXAM: ULTRASOUND ABDOMEN LIMITED RIGHT UPPER QUADRANT COMPARISON:  Abdominopelvic CT earlier today demonstrating choledocholithiasis. FINDINGS: Gallbladder: The gallbladder is prominently distended. The gallstones on prior CT are not well demonstrated on the current exam. Gallbladder wall thickening at 5 mm. Positive sonographic Murphy sign noted by sonographer. Common bile duct: Diameter: 13 mm. The common bile duct stones on CT are not well seen on the current exam. Liver: Mild central intrahepatic biliary ductal dilatation. No focal liver lesion. Portal vein is patent on color Doppler imaging with normal direction of blood flow towards the liver. Other: Small amount of right upper quadrant free fluid. IMPRESSION: 1. Distended gallbladder with wall thickening and positive sonographic Murphy sign. The gallstones on CT earlier today are not  well seen on the current exam. 2. Biliary dilatation, choledocholithiasis on CT not well demonstrated on the current exam. Electronically Signed   By: Keith Rake M.D.   On: 12/26/2021 02:18   CT ABDOMEN PELVIS W CONTRAST  Result Date: 12/26/2021 CLINICAL DATA:  Abdominal pain and vomiting. EXAM: CT ABDOMEN AND  PELVIS WITH CONTRAST TECHNIQUE: Multidetector CT imaging of the abdomen and pelvis was performed using the standard protocol following bolus administration of intravenous contrast. RADIATION DOSE REDUCTION: This exam was performed according to the departmental dose-optimization program which includes automated exposure control, adjustment of the mA and/or kV according to patient size and/or use of iterative reconstruction technique. CONTRAST:  158m OMNIPAQUE IOHEXOL 300 MG/ML  SOLN COMPARISON:  01/16/2021. FINDINGS: Lower chest: The heart is enlarged and pacemaker leads are noted in the heart. There are small bilateral pleural effusions. Hepatobiliary: No focal liver abnormality. Intrahepatic and extrahepatic biliary ductal dilatation is noted. The common bile duct measures 1 cm. Multiple stones are present in the distal common bile duct measuring up to 6 mm. Stones are present in the gallbladder. Pancreas: Unremarkable. No pancreatic ductal dilatation or surrounding inflammatory changes. Spleen: The spleen is normal in size. Scattered hypodensities are noted, possibly representing cysts or hemangiomas. Adrenals/Urinary Tract: No adrenal nodule or mass. The kidneys enhance symmetrically. Renal cortical scarring is noted bilaterally. A stable exophytic cyst is noted in the upper pole the right kidney. No renal calculus or hydronephrosis. The bladder is unremarkable. Stomach/Bowel: No bowel obstruction, free air, or pneumatosis. Gastric wall thickening is noted. Scattered diverticula are present along the colon without evidence of diverticulitis. The appendix is unremarkable. Mild bowel wall thickening is noted in the jejunum in the pelvis. Vascular/Lymphatic: Aortic atherosclerosis. No enlarged abdominal or pelvic lymph nodes. Reproductive: Status post hysterectomy. No adnexal masses. Other: Mesenteric edema is noted with free fluid at the gallbladder fossa, perihepatic space, and pelvis. Musculoskeletal: Sternotomy  wires are present over the midline. Degenerative changes are noted in the thoracolumbar spine. No acute osseous abnormality. IMPRESSION: 1. Increased intrahepatic and extrahepatic biliary ductal dilatation. There is increased distention of the common bile duct measuring 1 cm with choledocholithiasis. Cholelithiasis is also noted. Free fluid is noted at the gallbladder fossa and perihepatic space posteriorly. Correlate clinically to exclude superimposed acute cholecystitis. 2. Jejunal bowel wall thickening in the pelvis, possible infectious or inflammatory enteritis. 3. Diffuse mesenteric edema and a small amount of free fluid in the pelvis. 4. Small bilateral pleural effusions. 5. Gastric wall thickening, possible gastritis. 6. Aortic atherosclerosis. 7. Remaining ancillary findings as detailed above. Electronically Signed   By: LBrett FairyM.D.   On: 12/26/2021 00:54   DG Chest Port 1 View  Result Date: 12/25/2021 CLINICAL DATA:  Shortness of breath. EXAM: PORTABLE CHEST 1 VIEW COMPARISON:  01/16/2021. FINDINGS: The heart size and mediastinal contours are stable. There is atherosclerotic calcification of the aorta. A dual lead pacemaker is present over the left chest. There is blunting of the right costophrenic angle which is unchanged from the prior exam and may represent atelectasis or scarring. The left lung is clear. No pneumothorax. There is evidence of prior cardiothoracic surgery with TAVR stent. IMPRESSION: 1. Blunting of the right costophrenic angle, possibly representing atelectasis or scarring is compared with previous exams. 2. Otherwise stable chest. Electronically Signed   By: LBrett FairyM.D.   On: 12/25/2021 23:40    Medications: I have reviewed the patient's current medications. Prior to Admission:  Medications Prior to Admission  Medication Sig Dispense Refill Last Dose   acetaminophen (TYLENOL) 650 MG CR tablet Take 650-1,300 mg by mouth 2 (two) times daily as needed for pain.    12/24/2021   apixaban (ELIQUIS) 2.5 MG TABS tablet TAKE 1 TABLET(2.5 MG) BY MOUTH TWICE DAILY (Patient taking differently: Take 2.5 mg by mouth 2 (two) times daily.) 60 tablet 5 12/25/2021 at 1900   Carboxymethylcellulose Sodium (EYE DROPS OP) Apply 2 drops to eye daily as needed (gritty eyes).   Past Week   Cholecalciferol (VITAMIN D-3) 25 MCG (1000 UT) CAPS Take 1,000 Units by mouth daily.   12/25/2021   docusate sodium (COLACE) 100 MG capsule Take 100 mg by mouth daily.   12/25/2021   Ensure (ENSURE) Take 237 mLs by mouth every other day.   12/24/2021   fluticasone (FLONASE) 50 MCG/ACT nasal spray SHAKE LIQUID AND USE 2 SPRAYS IN EACH NOSTRIL EVERY DAY (Patient taking differently: Place 2 sprays into both nostrils daily.) 48 g 0 Past Week   fluticasone (FLOVENT HFA) 220 MCG/ACT inhaler INHALE 2 PUFFS INTO THE LUNGS TWICE DAILY (Patient taking differently: Inhale 2 puffs into the lungs daily in the afternoon.) 12 g 0 12/24/2021   guaiFENesin (MUCINEX) 600 MG 12 hr tablet Take 1 tablet (600 mg total) by mouth 2 (two) times daily. 30 tablet 0 12/25/2021   LINZESS 72 MCG capsule Take 1 capsule (72 mcg total) by mouth daily. 30 capsule 0 12/25/2021   loratadine (CLARITIN) 10 MG tablet Take 1 tablet (10 mg total) by mouth daily. 30 tablet 0 12/25/2021   nitroGLYCERIN (NITROSTAT) 0.4 MG SL tablet DISSOLVE 1 TABLET UNDER THE TONGUE EVERY 5 MINUTES AS NEEDED FOR CHEST PAIN (Patient taking differently: Place 0.4 mg under the tongue every 5 (five) minutes as needed for chest pain.) 75 tablet 2 Past Week   OVER THE COUNTER MEDICATION Take by mouth daily as needed (cough). Guaifenesin liquid PO 1 sip by mouth daily as needed for cough   12/24/2021   pantoprazole (PROTONIX) 40 MG tablet TAKE 1 TABLET BY MOUTH TWICE DAILY (Patient taking differently: Take 40 mg by mouth 2 (two) times daily.) 180 tablet 1 12/25/2021   rosuvastatin (CRESTOR) 40 MG tablet TAKE 1 TABLET(40 MG) BY MOUTH DAILY (Patient taking differently: Take 40 mg by  mouth at bedtime.) 90 tablet 0 12/25/2021   VENTOLIN HFA 108 (90 Base) MCG/ACT inhaler INHALE 2 PUFFS INTO THE LUNGS EVERY 6 HOURS AS NEEDED FOR WHEEZING OR SHORTNESS OF BREATH (Patient taking differently: Inhale 1-2 puffs into the lungs as needed for shortness of breath or wheezing.) 18 g 0 12/25/2021   Wheat Dextrin (BENEFIBER PO) Take 1 Scoop by mouth daily as needed (fiber).   Past Month   polyethylene glycol powder (MIRALAX) 17 GM/SCOOP powder Take 17 g by mouth 2 (two) times daily as needed for moderate constipation. (Patient not taking: Reported on 10/07/2021) 255 g 0 Not Taking   Scheduled:  pantoprazole (PROTONIX) IV  40 mg Intravenous Q24H   Continuous:  lactated ringers 75 mL/hr at 12/27/21 0353   piperacillin-tazobactam (ZOSYN)  IV 3.375 g (12/27/21 1150)   Assessment/Plan: 1) Acute cholecystitis, choledocholithiasis, gallstone pancreatitis-plan to proceed with a ERCP tomorrow. 2) Elevated troponins probably secondary to demand ischemia. 3) CAD status post CABG. 4) Paroxysmal atrial fibrillation status post Maze procedure. 5) Carotid artery stenosis status post carotid endarterectomy. 6) History of severe aortic stenosis status post TAVR. 7) Chronic diastolic congestive heart failure/Hypertension/SSS s/p PPM. 8) GERD.  LOS: 1 day  Juanita Craver 12/27/2021, 11:56 AM

## 2021-12-27 NOTE — Progress Notes (Signed)
Subjective: CC: Seen with attending. Upper abdominal pain improved. No n/v. On cld now. Last BM 2 days ago.   Objective: Vital signs in last 24 hours: Temp:  [98.6 F (37 C)-99.5 F (37.5 C)] 99.5 F (37.5 C) (08/07 0813) Pulse Rate:  [70-86] 70 (08/07 0302) Resp:  [19-25] 22 (08/07 0302) BP: (95-128)/(47-68) 121/47 (08/07 0302) SpO2:  [91 %-94 %] 93 % (08/07 0302) Weight:  [41.4 kg] 41.4 kg (08/07 0302) Last BM Date : 12/25/21  Intake/Output from previous day: 08/06 0701 - 08/07 0700 In: 1229.8 [I.V.:1179.8; IV Piggyback:50] Out: -  Intake/Output this shift: No intake/output data recorded.  PE: Gen:  Alert, NAD, pleasant Abd: Soft, ND, some upper abdominal ttp greatest in the epigastrium and ruq, +BS. There is a R inguinal hernia appears to reduce but spontaneously recurs - she reports this is chronic  Lab Results:  Recent Labs    12/26/21 0652 12/27/21 0221  WBC 13.4* 7.6  HGB 12.2 11.2*  HCT 37.8 33.4*  PLT 150 109*   BMET Recent Labs    12/26/21 0652 12/27/21 0221  NA 138 138  K 3.5 3.1*  CL 108 107  CO2 23 22  GLUCOSE 110* 84  BUN 10 10  CREATININE 0.51 0.51  CALCIUM 8.6* 8.7*   PT/INR No results for input(s): "LABPROT", "INR" in the last 72 hours. CMP     Component Value Date/Time   NA 138 12/27/2021 0221   NA 140 10/07/2021 1559   K 3.1 (L) 12/27/2021 0221   CL 107 12/27/2021 0221   CO2 22 12/27/2021 0221   GLUCOSE 84 12/27/2021 0221   BUN 10 12/27/2021 0221   BUN 7 (L) 10/07/2021 1559   CREATININE 0.51 12/27/2021 0221   CREATININE 0.41 (L) 08/21/2015 0001   CALCIUM 8.7 (L) 12/27/2021 0221   PROT 5.2 (L) 12/27/2021 0221   PROT 6.4 10/07/2021 1559   ALBUMIN 2.9 (L) 12/27/2021 0221   ALBUMIN 4.1 10/07/2021 1559   AST 28 12/27/2021 0221   ALT 25 12/27/2021 0221   ALKPHOS 33 (L) 12/27/2021 0221   BILITOT 1.1 12/27/2021 0221   BILITOT 0.5 10/07/2021 1559   GFRNONAA >60 12/27/2021 0221   GFRAA 100 11/13/2019 1529   Lipase      Component Value Date/Time   LIPASE 292 (H) 12/27/2021 0221    Studies/Results: US Abdomen Limited RUQ (LIVER/GB)  Result Date: 12/26/2021 CLINICAL DATA:  Epigastric abdominal pain. EXAM: ULTRASOUND ABDOMEN LIMITED RIGHT UPPER QUADRANT COMPARISON:  Abdominopelvic CT earlier today demonstrating choledocholithiasis. FINDINGS: Gallbladder: The gallbladder is prominently distended. The gallstones on prior CT are not well demonstrated on the current exam. Gallbladder wall thickening at 5 mm. Positive sonographic Murphy sign noted by sonographer. Common bile duct: Diameter: 13 mm. The common bile duct stones on CT are not well seen on the current exam. Liver: Mild central intrahepatic biliary ductal dilatation. No focal liver lesion. Portal vein is patent on color Doppler imaging with normal direction of blood flow towards the liver. Other: Small amount of right upper quadrant free fluid. IMPRESSION: 1. Distended gallbladder with wall thickening and positive sonographic Murphy sign. The gallstones on CT earlier today are not well seen on the current exam. 2. Biliary dilatation, choledocholithiasis on CT not well demonstrated on the current exam. Electronically Signed   By: Keith Rake M.D.   On: 12/26/2021 02:18   CT ABDOMEN PELVIS W CONTRAST  Result Date: 12/26/2021 CLINICAL DATA:  Abdominal pain and vomiting.  EXAM: CT ABDOMEN AND PELVIS WITH CONTRAST TECHNIQUE: Multidetector CT imaging of the abdomen and pelvis was performed using the standard protocol following bolus administration of intravenous contrast. RADIATION DOSE REDUCTION: This exam was performed according to the departmental dose-optimization program which includes automated exposure control, adjustment of the mA and/or kV according to patient size and/or use of iterative reconstruction technique. CONTRAST:  180m OMNIPAQUE IOHEXOL 300 MG/ML  SOLN COMPARISON:  01/16/2021. FINDINGS: Lower chest: The heart is enlarged and pacemaker leads are  noted in the heart. There are small bilateral pleural effusions. Hepatobiliary: No focal liver abnormality. Intrahepatic and extrahepatic biliary ductal dilatation is noted. The common bile duct measures 1 cm. Multiple stones are present in the distal common bile duct measuring up to 6 mm. Stones are present in the gallbladder. Pancreas: Unremarkable. No pancreatic ductal dilatation or surrounding inflammatory changes. Spleen: The spleen is normal in size. Scattered hypodensities are noted, possibly representing cysts or hemangiomas. Adrenals/Urinary Tract: No adrenal nodule or mass. The kidneys enhance symmetrically. Renal cortical scarring is noted bilaterally. A stable exophytic cyst is noted in the upper pole the right kidney. No renal calculus or hydronephrosis. The bladder is unremarkable. Stomach/Bowel: No bowel obstruction, free air, or pneumatosis. Gastric wall thickening is noted. Scattered diverticula are present along the colon without evidence of diverticulitis. The appendix is unremarkable. Mild bowel wall thickening is noted in the jejunum in the pelvis. Vascular/Lymphatic: Aortic atherosclerosis. No enlarged abdominal or pelvic lymph nodes. Reproductive: Status post hysterectomy. No adnexal masses. Other: Mesenteric edema is noted with free fluid at the gallbladder fossa, perihepatic space, and pelvis. Musculoskeletal: Sternotomy wires are present over the midline. Degenerative changes are noted in the thoracolumbar spine. No acute osseous abnormality. IMPRESSION: 1. Increased intrahepatic and extrahepatic biliary ductal dilatation. There is increased distention of the common bile duct measuring 1 cm with choledocholithiasis. Cholelithiasis is also noted. Free fluid is noted at the gallbladder fossa and perihepatic space posteriorly. Correlate clinically to exclude superimposed acute cholecystitis. 2. Jejunal bowel wall thickening in the pelvis, possible infectious or inflammatory enteritis. 3.  Diffuse mesenteric edema and a small amount of free fluid in the pelvis. 4. Small bilateral pleural effusions. 5. Gastric wall thickening, possible gastritis. 6. Aortic atherosclerosis. 7. Remaining ancillary findings as detailed above. Electronically Signed   By: LBrett FairyM.D.   On: 12/26/2021 00:54   DG Chest Port 1 View  Result Date: 12/25/2021 CLINICAL DATA:  Shortness of breath. EXAM: PORTABLE CHEST 1 VIEW COMPARISON:  01/16/2021. FINDINGS: The heart size and mediastinal contours are stable. There is atherosclerotic calcification of the aorta. A dual lead pacemaker is present over the left chest. There is blunting of the right costophrenic angle which is unchanged from the prior exam and may represent atelectasis or scarring. The left lung is clear. No pneumothorax. There is evidence of prior cardiothoracic surgery with TAVR stent. IMPRESSION: 1. Blunting of the right costophrenic angle, possibly representing atelectasis or scarring is compared with previous exams. 2. Otherwise stable chest. Electronically Signed   By: LBrett FairyM.D.   On: 12/25/2021 23:40    Anti-infectives: Anti-infectives (From admission, onward)    Start     Dose/Rate Route Frequency Ordered Stop   12/26/21 1200  piperacillin-tazobactam (ZOSYN) IVPB 3.375 g        3.375 g 12.5 mL/hr over 240 Minutes Intravenous Every 8 hours 12/26/21 1133     12/26/21 0130  piperacillin-tazobactam (ZOSYN) IVPB 3.375 g        3.375  g 100 mL/hr over 30 Minutes Intravenous  Once 12/26/21 0127 12/26/21 0247        Assessment/Plan Biliary Pancreatitis - Await timing of ERCP by GI - Suspect wall thickening is related to peripancreatic inflammation but would cover empirically with IV abx at present - Lipase and LFT's downtrending.  - Cards consult for risk stratification  - Continue to trend labs  FEN - Per GI VTE - SCDs, okay for chem ppx from our standpoint ID - Zosyn  Hx CHF HTN SSS s/p PPM Hx AS s/p TAVR GERD CAD  s/p CABG  CAS s/p R carotid endarterectomy     LOS: 1 day    Jillyn Ledger , [facility] Surgery 12/27/2021, 10:48 AM Please see Amion for pager number during day hours 7:00am-4:30pm

## 2021-12-28 ENCOUNTER — Inpatient Hospital Stay (HOSPITAL_COMMUNITY): Payer: Medicare Other

## 2021-12-28 ENCOUNTER — Inpatient Hospital Stay (HOSPITAL_COMMUNITY): Payer: Medicare Other | Admitting: Anesthesiology

## 2021-12-28 ENCOUNTER — Encounter (HOSPITAL_COMMUNITY)
Admission: EM | Disposition: A | Discharge status: Home Health | Payer: Self-pay | Source: Home / Self Care | Attending: Internal Medicine

## 2021-12-28 ENCOUNTER — Encounter (HOSPITAL_COMMUNITY): Payer: Self-pay | Admitting: Internal Medicine

## 2021-12-28 DIAGNOSIS — I361 Nonrheumatic tricuspid (valve) insufficiency: Secondary | ICD-10-CM | POA: Diagnosis not present

## 2021-12-28 DIAGNOSIS — F419 Anxiety disorder, unspecified: Secondary | ICD-10-CM

## 2021-12-28 DIAGNOSIS — I4891 Unspecified atrial fibrillation: Secondary | ICD-10-CM | POA: Diagnosis not present

## 2021-12-28 DIAGNOSIS — Z01818 Encounter for other preprocedural examination: Secondary | ICD-10-CM

## 2021-12-28 DIAGNOSIS — K8050 Calculus of bile duct without cholangitis or cholecystitis without obstruction: Secondary | ICD-10-CM | POA: Diagnosis not present

## 2021-12-28 DIAGNOSIS — Z8673 Personal history of transient ischemic attack (TIA), and cerebral infarction without residual deficits: Secondary | ICD-10-CM | POA: Diagnosis not present

## 2021-12-28 DIAGNOSIS — K810 Acute cholecystitis: Secondary | ICD-10-CM | POA: Diagnosis not present

## 2021-12-28 DIAGNOSIS — I340 Nonrheumatic mitral (valve) insufficiency: Secondary | ICD-10-CM | POA: Diagnosis not present

## 2021-12-28 HISTORY — PX: ERCP: SHX5425

## 2021-12-28 HISTORY — PX: SPHINCTEROTOMY: SHX5544

## 2021-12-28 HISTORY — PX: REMOVAL OF STONES: SHX5545

## 2021-12-28 LAB — GLUCOSE, CAPILLARY
Glucose-Capillary: 35 mg/dL — CL (ref 70–99)
Glucose-Capillary: 147 mg/dL — ABNORMAL HIGH (ref 70–99)
Glucose-Capillary: 85 mg/dL (ref 70–99)
Glucose-Capillary: 82 mg/dL (ref 70–99)

## 2021-12-28 LAB — COMPREHENSIVE METABOLIC PANEL
ALT: 19 U/L (ref 0–44)
AST: 21 U/L (ref 15–41)
Albumin: 2.9 g/dL — ABNORMAL LOW (ref 3.5–5.0)
Alkaline Phosphatase: 32 U/L — ABNORMAL LOW (ref 38–126)
Anion gap: 15 (ref 5–15)
BUN: 11 mg/dL (ref 8–23)
CO2: 18 mmol/L — ABNORMAL LOW (ref 22–32)
Calcium: 9 mg/dL (ref 8.9–10.3)
Chloride: 107 mmol/L (ref 98–111)
Creatinine, Ser: 0.66 mg/dL (ref 0.44–1.00)
GFR, Estimated: >60 mL/min (ref >60)
Glucose, Bld: 50 mg/dL — ABNORMAL LOW (ref 70–99)
Potassium: 3.6 mmol/L (ref 3.5–5.1)
Sodium: 140 mmol/L (ref 135–145)
Total Bilirubin: 1.4 mg/dL — ABNORMAL HIGH (ref 0.3–1.2)
Total Protein: 5.3 g/dL — ABNORMAL LOW (ref 6.5–8.1)

## 2021-12-28 LAB — ECHOCARDIOGRAM COMPLETE
AR max vel: 3.5 cm2
AV Area VTI: 3.54 cm2
AV Area mean vel: 3.31 cm2
AV Mean grad: 7.7 mmHg
AV Peak grad: 15.3 mmHg
Ao pk vel: 1.96 m/s
Area-P 1/2: 3.21 cm2
Calc EF: 52.1 %
MV M vel: 5.79 m/s
MV Peak grad: 134.1 mmHg
MV VTI: 3.47 cm2
Radius: 0.4 cm
S' Lateral: 2.7 cm
Single Plane A2C EF: 56.6 %
Single Plane A4C EF: 52.3 %
Weight: 1502.66 oz

## 2021-12-28 LAB — LIPASE, BLOOD: Lipase: 86 U/L — ABNORMAL HIGH (ref 11–51)

## 2021-12-28 LAB — CBC
HCT: 36.7 % (ref 36.0–46.0)
Hemoglobin: 11.9 g/dL — ABNORMAL LOW (ref 12.0–15.0)
MCH: 30 pg (ref 26.0–34.0)
MCHC: 32.4 g/dL (ref 30.0–36.0)
MCV: 92.4 fL (ref 80.0–100.0)
Platelets: 132 10*3/uL — ABNORMAL LOW (ref 150–400)
RBC: 3.97 MIL/uL (ref 3.87–5.11)
RDW: 14.6 % (ref 11.5–15.5)
WBC: 8.6 10*3/uL (ref 4.0–10.5)
nRBC: 0 % (ref 0.0–0.2)

## 2021-12-28 LAB — MAGNESIUM: Magnesium: 1.8 mg/dL (ref 1.7–2.4)

## 2021-12-28 SURGERY — ERCP, WITH INTERVENTION IF INDICATED
Anesthesia: General

## 2021-12-28 MED ORDER — SODIUM CHLORIDE 0.9 % IV SOLN
INTRAVENOUS | Status: DC | PRN
  Administered 2021-12-28: 20 mL

## 2021-12-28 MED ORDER — INDOMETHACIN 50 MG RE SUPP
RECTAL | Status: AC
  Filled 2021-12-28: qty 2

## 2021-12-28 MED ORDER — SUGAMMADEX SODIUM 200 MG/2ML IV SOLN
INTRAVENOUS | Status: DC | PRN
  Administered 2021-12-28: 200 mg via INTRAVENOUS

## 2021-12-28 MED ORDER — LIDOCAINE 2% (20 MG/ML) 5 ML SYRINGE
INTRAMUSCULAR | Status: DC | PRN
  Administered 2021-12-28: 50 mg via INTRAVENOUS

## 2021-12-28 MED ORDER — ROCURONIUM BROMIDE 10 MG/ML (PF) SYRINGE
PREFILLED_SYRINGE | INTRAVENOUS | Status: DC | PRN
  Administered 2021-12-28: 40 mg via INTRAVENOUS

## 2021-12-28 MED ORDER — DEXAMETHASONE SODIUM PHOSPHATE 10 MG/ML IJ SOLN
INTRAMUSCULAR | Status: DC | PRN
  Administered 2021-12-28: 8 mg via INTRAVENOUS

## 2021-12-28 MED ORDER — GLUCAGON HCL RDNA (DIAGNOSTIC) 1 MG IJ SOLR
INTRAMUSCULAR | Status: AC
  Filled 2021-12-28: qty 1

## 2021-12-28 MED ORDER — PHENYLEPHRINE 80 MCG/ML (10ML) SYRINGE FOR IV PUSH (FOR BLOOD PRESSURE SUPPORT)
PREFILLED_SYRINGE | INTRAVENOUS | Status: DC | PRN
  Administered 2021-12-28: 160 ug via INTRAVENOUS

## 2021-12-28 MED ORDER — KCL IN DEXTROSE-NACL 20-5-0.9 MEQ/L-%-% IV SOLN
INTRAVENOUS | Status: DC
  Filled 2021-12-28 (×2): qty 1000

## 2021-12-28 MED ORDER — PHENYLEPHRINE HCL-NACL 20-0.9 MG/250ML-% IV SOLN
INTRAVENOUS | Status: DC | PRN
  Administered 2021-12-28: 50 ug/min via INTRAVENOUS

## 2021-12-28 MED ORDER — FENTANYL CITRATE (PF) 100 MCG/2ML IJ SOLN
INTRAMUSCULAR | Status: DC | PRN
  Administered 2021-12-28: 50 ug via INTRAVENOUS

## 2021-12-28 MED ORDER — ONDANSETRON HCL 4 MG/2ML IJ SOLN
INTRAMUSCULAR | Status: DC | PRN
  Administered 2021-12-28: 4 mg via INTRAVENOUS

## 2021-12-28 MED ORDER — DICLOFENAC SUPPOSITORY 100 MG
RECTAL | Status: AC
  Filled 2021-12-28: qty 1

## 2021-12-28 MED ORDER — PROPOFOL 10 MG/ML IV BOLUS
INTRAVENOUS | Status: DC | PRN
  Administered 2021-12-28: 80 mg via INTRAVENOUS

## 2021-12-28 MED ORDER — DEXTROSE 50 % IV SOLN
INTRAVENOUS | Status: AC
  Filled 2021-12-28: qty 50

## 2021-12-28 MED ORDER — LACTATED RINGERS IV SOLN: INTRAVENOUS | Status: DC

## 2021-12-28 MED ORDER — SODIUM CHLORIDE 0.9 % IV SOLN: INTRAVENOUS | Status: DC

## 2021-12-28 NOTE — Progress Notes (Signed)
Day of Surgery  Subjective: CC: Denies abd pain. No n/v. No BM   Objective: Vital signs in last 24 hours: Temp:  [97.8 F (36.6 C)-99.6 F (37.6 C)] 98 F (36.7 C) (08/08 0742) Pulse Rate:  [69-70] 69 (08/08 0742) Resp:  [16-33] 33 (08/08 0742) BP: (114-152)/(47-112) 114/55 (08/08 0742) SpO2:  [93 %-96 %] 93 % (08/08 0742) Weight:  [42.6 kg] 42.6 kg (08/08 0306) Last BM Date : 12/24/21  Intake/Output from previous day: 08/07 0701 - 08/08 0700 In: 115  Out: 1100 [Urine:1100] Intake/Output this shift: No intake/output data recorded.  PE: Gen:  Alert, NAD, pleasant Abd: Soft, ND, overall nontender, +BS. There is a R inguinal hernia appears to reduce but spontaneously recurs - she reports this is chronic Psych: A&Ox4  Lab Results:  Recent Labs    12/27/21 0221 12/28/21 0307  WBC 7.6 8.6  HGB 11.2* 11.9*  HCT 33.4* 36.7  PLT 109* 132*   BMET Recent Labs    12/27/21 0221 12/28/21 0307  NA 138 140  K 3.1* 3.6  CL 107 107  CO2 22 18*  GLUCOSE 84 50*  BUN 10 11  CREATININE 0.51 0.66  CALCIUM 8.7* 9.0   PT/INR No results for input(s): "LABPROT", "INR" in the last 72 hours. CMP     Component Value Date/Time   NA 140 12/28/2021 0307   NA 140 10/07/2021 1559   K 3.6 12/28/2021 0307   CL 107 12/28/2021 0307   CO2 18 (L) 12/28/2021 0307   GLUCOSE 50 (L) 12/28/2021 0307   BUN 11 12/28/2021 0307   BUN 7 (L) 10/07/2021 1559   CREATININE 0.66 12/28/2021 0307   CREATININE 0.41 (L) 08/21/2015 0001   CALCIUM 9.0 12/28/2021 0307   PROT 5.3 (L) 12/28/2021 0307   PROT 6.4 10/07/2021 1559   ALBUMIN 2.9 (L) 12/28/2021 0307   ALBUMIN 4.1 10/07/2021 1559   AST 21 12/28/2021 0307   ALT 19 12/28/2021 0307   ALKPHOS 32 (L) 12/28/2021 0307   BILITOT 1.4 (H) 12/28/2021 0307   BILITOT 0.5 10/07/2021 1559   GFRNONAA >60 12/28/2021 0307   GFRAA 100 11/13/2019 1529   Lipase     Component Value Date/Time   LIPASE 86 (H) 12/28/2021 0307    Studies/Results: No  results found.  Anti-infectives: Anti-infectives (From admission, onward)    Start     Dose/Rate Route Frequency Ordered Stop   12/26/21 1200  piperacillin-tazobactam (ZOSYN) IVPB 3.375 g        3.375 g 12.5 mL/hr over 240 Minutes Intravenous Every 8 hours 12/26/21 1133     12/26/21 0130  piperacillin-tazobactam (ZOSYN) IVPB 3.375 g        3.375 g 100 mL/hr over 30 Minutes Intravenous  Once 12/26/21 0127 12/26/21 0247        Assessment/Plan Biliary Pancreatitis - ERCP today by GI - Suspect wall thickening is related to peripancreatic inflammation but would cover empirically with IV abx at present - Lipase and LFT's downtrending.  - Cards consult for risk stratification 8/7 -- she has a 15% 30 day risk death, MI, or cardiac arrest after surgery. which does not account for her age or malnutrition.  - will follow results of ERCP. If she does not have signs of cholecystitis with diet advancement after the procedure then will likely recommend non-operative management and observation, accepting the risk of recurrent issues related to symptomatic cholelithiasis, given her significant perioperative cardiac risks as outlined above.  FEN - Per GI  VTE - SCDs, okay for chem ppx from our standpoint ID - Zosyn  Hx CHF HTN SSS s/p PPM Hx AS s/p TAVR GERD CAD s/p CABG  CAS s/p R carotid endarterectomy     LOS: 2 days    Jill Alexanders , [facility] 12/28/2021, 10:01 AM Please see Amion for pager number during day hours 7:00am-4:30pm

## 2021-12-28 NOTE — Progress Notes (Signed)
  Echocardiogram 2D Echocardiogram has been performed.  [name] 12/28/2021, 10:12 AM

## 2021-12-28 NOTE — Transfer of Care (Signed)
Immediate Anesthesia Transfer of Care Note  Patient: [name]  Procedure(s) Performed: ENDOSCOPIC RETROGRADE CHOLANGIOPANCREATOGRAPHY (ERCP) SPHINCTEROTOMY REMOVAL OF STONES  Patient Location: Endoscopy Unit  Anesthesia Type:General  Level of Consciousness: awake, alert  and oriented  Airway & Oxygen Therapy: Patient Spontanous Breathing and Patient connected to nasal cannula oxygen  Post-op Assessment: Report given to RN and Post -op Vital signs reviewed and stable  Post vital signs: Reviewed and stable  Last Vitals:  Vitals Value Taken Time  BP 166/63 12/28/21 1343  Temp 36.3 C 12/28/21 1343  Pulse 75 12/28/21 1347  Resp 23 12/28/21 1347  SpO2 97 % 12/28/21 1347  Vitals shown include unvalidated device data.  Last Pain:  Vitals:   12/28/21 1343  TempSrc: Temporal  PainSc: Asleep         Complications: No notable events documented.

## 2021-12-28 NOTE — Progress Notes (Signed)
Pt off unit for ERCP. Report called, pre-procedure checklist, complete, and consent signed. Pt's daughter accompanied her off the unit.  Justice Rocher, RN

## 2021-12-28 NOTE — Anesthesia Postprocedure Evaluation (Signed)
Anesthesia Post Note  Patient: [name]  Procedure(s) Performed: ENDOSCOPIC RETROGRADE CHOLANGIOPANCREATOGRAPHY (ERCP) SPHINCTEROTOMY REMOVAL OF STONES     Patient location during evaluation: PACU Anesthesia Type: General Level of consciousness: awake and alert Pain management: pain level controlled Vital Signs Assessment: post-procedure vital signs reviewed and stable Respiratory status: spontaneous breathing, nonlabored ventilation, respiratory function stable and patient connected to nasal cannula oxygen Cardiovascular status: blood pressure returned to baseline and stable Postop Assessment: no apparent nausea or vomiting Anesthetic complications: no   No notable events documented.  Last Vitals:  Vitals:   12/28/21 1343 12/28/21 1353  BP: (!) 166/63 (!) 156/61  Pulse: 84 69  Resp: (!) 30 (!) 21  Temp: (!) 36.3 C   SpO2: 99% 96%    Last Pain:  Vitals:   12/28/21 1353  TempSrc:   PainSc: 0-No pain                 Marcellas Marchant

## 2021-12-28 NOTE — Progress Notes (Signed)
PROGRESS NOTE    [name]  LXB:[id number] DOB: Sep 16, 1928 DOA: 12/25/2021 PCP: Denita Lung, MD    Brief Narrative:   [name] is a 88 y.o. female with past medical history significant for CAD s/p CABG, paroxysmal atrial fibrillation s/p MAZE procedure, CAS s/p CEA, fibromyalgia, GERD, moderate MR, severe AS s/p TAVR, SSS s/p PPM, chronic diastolic congestive heart failure, essential hypertension, hyperlipidemia, asthma who presented to [facility] ED on 8/5 with progressive abdominal pain associated with nausea.  Daughter present who assists with HPI, reports started having back pain on her right side roughly 1 week ago that progressed to severe abdominal pain with associated nausea and dry heaves.  Denies fever/chills, no chest pain or shortness of breath.    In the ED, temperature 98.6 F, HR 95, RR 21, BP 156/71, SPO2 98% on room air.  Sodium 140, potassium 3.4, chloride 106, CO2 27, glucose 121, BUN 11, creatinine 0.54, AST 40, ALT 18, total bilirubin 1.0.  Lipase 43,580.  WBC 12.3, hemoglobin 12.7, platelets 152.  High sensitive troponin 19>23.  Chest x-ray with blunting the right costophrenic angle consistent with atelectasis versus scarring otherwise no other acute cardiopulmonary disease process.  CT abdomen/pelvis with contrast with increased intrahepatic/extrahepatic biliary ductal dilation, increased distention of the common bile duct measuring 1 cm with choledocholithiasis, free fluid gallbladder fossa and perihepatic space posteriorly, jejunal bowel wall thickening consistent with infectious versus inflammatory enteritis, diffuse mesenteric edema and small amount of free fluid in the pelvis, gastric wall thickening, aortic atherosclerosis.  EDP consulted general surgery, Dr. Vale Haven who recommended initiation of antibiotics and medicine admission.  TRH consulted for admission for acute cholecystitis, choledocholithiasis with also concerns of gallstone pancreatitis.  Assessment & Plan:    Acute cholecystitis Choledocholithiasis Gallstone pancreatitis Patient presenting with 1 week of progressive right-sided abdominal pain associate with nausea.  On admission, notably with leukocytosis and CT abdomen/pelvis with contrast with increased intrahepatic/extrahepatic biliary ductal dilation, increased distention of the common bile duct measuring 1 cm with choledocholithiasis, free fluid gallbladder fossa and perihepatic space. Ultrasound right upper quadrant with distended gallbladder with gallbladder wall thickening and positive sonographic Murphy sign. --General surgery and gastroenterology following, appreciate assistance --Lipase [phone] --Tbili 1.0>0.9>1.1>1.4 --IR consulted but holding on percutaneous intervention until further evaluation by surgery/GI --Zosyn --IVF w/ D5NS at 88m/hr --CMP, lipase daily --Supportive care, antiemetics, pain control --NPO; ERCP today --Further per GI/general surgery  Elevated troponin hs troponin 19>23>54>53; plateaued.  Suspect elevation likely secondary to type II demand ischemia in the setting of infection as above.  EKG with atrial fibrillation, rate 99.  On telemetry notably V paced. --Continue monitor on telemetry --Cardiology consulted; recommended TTE planned for today  Hypokalemia Potassium 3.1, will replete. --Repeat electrolytes in a.m. to include magnesium  CAD s/p CABG --Holding  statin while n.p.o. --Cardiology consulted for preoperative evaluation as requested by general surgery, Dr. WDema Severin  Paroxysmal atrial fibrillation s/p MAZE Follows with electrophysiology outpatient. --Currently holding home Eliquis --Monitor on telemetry  CAS s/p CEA --Holding statin as above  GERD --Protonix 40 mg IV q24h while n.p.o.  Hx Severe AS s/p TAVR TTE 01/15/2021 with noted 23 mm SAPIEN prosthetic valve in the aortic position, unable to assess valve hemodynamically due to limited acoustic windows. --Outpatient follow-up  with cardiology  Sick sinus syndrome s/p PPM --Continue monitor on telemetry  Chronic diastolic congestive heart failure Essential hypertension TTE 12/2020 with LVEF 55-60%, LV normal function, mild LVH, RV systolic function mildly reduced, mild/moderate MR,  moderate TR, TAVR noted.  Currently not on antihypertensive therapy outpatient. --Continue monitor BP closely in the setting of infection as above  Hyperlipidemia --Holding statin  Asthma --Albuterol neb as needed  Severe protein calorie malnutrition Body mass index is 17.18 kg/m. Significant fat loss and muscle wasting noted on physical exam. --Consult dietitian once able to transition to diet   DVT prophylaxis: SCDs Start: 12/26/21 0428    Code Status: Full Code Family Communication: Updated daughter present at bedside this morning  Disposition Plan:  Level of care: Progressive Status is: Inpatient Remains inpatient appropriate because: Continue IV antibiotics, GI planning ERCP today.  Await further recommendations from general surgery.      Consultants:  General surgery, Dr. Horris Latino GI IR Cardiology - Per request from general surgery for preoperative clearance  Procedures:  TTE: Pending  Antimicrobials:  Zosyn 8/5>>   Subjective: Patient seen examined bedside, resting comfortably.  Lying in bed.  Daughter present.  No specific complaints this morning.  Currently getting cleaned up by nursing staff.  Remains on IV antibiotics.  GI planning ERCP today.  Labs notable for slightly increased total bilirubin today.  Abdominal pain continues to be much improved since initial presentation. No other specific complaints or concerns at this time.  Denies headache, no dizziness, no chest pain, no palpitations, no current nausea/vomiting, no diarrhea, no cough/congestion, no subjective fever, no chills/night sweats, no focal weakness, no paresthesias.  No acute events overnight per nursing staff.  Objective: Vitals:    12/28/21 0306 12/28/21 0719 12/28/21 0742 12/28/21 1130  BP: (!) 144/64 (!) 152/64 (!) 114/55 (!) 111/96  Pulse: 70 70 69 70  Resp: 19 (!) 23 (!) 33 (!) 22  Temp: 99.1 F (37.3 C) 98.4 F (36.9 C) 98 F (36.7 C) 98.8 F (37.1 C)  TempSrc: Oral Axillary Oral Oral  SpO2: 94% 96% 93% 96%  Weight: 42.6 kg       Intake/Output Summary (Last 24 hours) at 12/28/2021 1141 Last data filed at 12/27/2021 2321 Gross per 24 hour  Intake 15 ml  Output 1100 ml  Net -1085 ml   Filed Weights   12/26/21 0500 12/27/21 0302 12/28/21 0306  Weight: 41 kg 41.4 kg 42.6 kg    Examination:  Physical Exam: GEN: NAD, alert and oriented x 3, elderly/thin/cachectic in appearance with significant muscle wasting/fat loss appreciated HEENT: NCAT, PERRL, EOMI, sclera clear, MMM PULM: CTAB w/o wheezes/crackles, normal respiratory effort, on room air CV: RRR w/o M/G/R GI: abd soft, slight right upper quadrant tenderness to palpation, abdomen nondistended, NABS, no R/G/M MSK: no peripheral edema, moves all extremities independently NEURO: CN II-XII intact, no focal deficits, sensation to light touch intact PSYCH: normal mood/affect Integumentary: dry/intact, no rashes or wounds    Data Reviewed: I have personally reviewed following labs and imaging studies  CBC: Recent Labs  Lab 12/25/21 2202 12/25/21 2214 12/26/21 0652 12/27/21 0221 12/28/21 0307  WBC 12.3*  --  13.4* 7.6 8.6  NEUTROABS 11.2*  --   --   --   --   HGB 12.7 13.3 12.2 11.2* 11.9*  HCT 39.2 39.0 37.8 33.4* 36.7  MCV 94.0  --  92.6 92.5 92.4  PLT 152  --  150 109* 644*   Basic Metabolic Panel: Recent Labs  Lab 12/25/21 2202 12/25/21 2214 12/26/21 0652 12/27/21 0221 12/28/21 0307  NA 140 140 138 138 140  K 3.4* 3.3* 3.5 3.1* 3.6  CL 106 103 108 107 107  CO2 27  --  23 22 18*  GLUCOSE 121* 112* 110* 84 50*  BUN '11 12 10 10 11  '$ CREATININE 0.54 0.30* 0.51 0.51 0.66  CALCIUM 9.1  --  8.6* 8.7* 9.0  MG  --   --   --   --   1.8   GFR: Estimated Creatinine Clearance: 31.4 mL/min (by C-G formula based on SCr of 0.66 mg/dL). Liver Function Tests: Recent Labs  Lab 12/25/21 2202 12/26/21 0652 12/27/21 0221 12/28/21 0307  AST 40 63* 28 21  ALT 18 37 25 19  ALKPHOS 41 37* 33* 32*  BILITOT 1.0 0.9 1.1 1.4*  PROT 6.6 5.7* 5.2* 5.3*  ALBUMIN 3.9 3.4* 2.9* 2.9*   Recent Labs  Lab 12/25/21 2202 12/26/21 0652 12/27/21 0221 12/28/21 0307  LIPASE 3,580* 1,593* 292* 86*   No results for input(s): "AMMONIA" in the last 168 hours. Coagulation Profile: No results for input(s): "INR", "PROTIME" in the last 168 hours. Cardiac Enzymes: No results for input(s): "CKTOTAL", "CKMB", "CKMBINDEX", "TROPONINI" in the last 168 hours. BNP (last 3 results) No results for input(s): "PROBNP" in the last 8760 hours. HbA1C: No results for input(s): "HGBA1C" in the last 72 hours. CBG: Recent Labs  Lab 12/26/21 0933 12/28/21 0730 12/28/21 0832  GLUCAP 91 35* 147*   Lipid Profile: No results for input(s): "CHOL", "HDL", "LDLCALC", "TRIG", "CHOLHDL", "LDLDIRECT" in the last 72 hours. Thyroid Function Tests: No results for input(s): "TSH", "T4TOTAL", "FREET4", "T3FREE", "THYROIDAB" in the last 72 hours. Anemia Panel: No results for input(s): "VITAMINB12", "FOLATE", "FERRITIN", "TIBC", "IRON", "RETICCTPCT" in the last 72 hours. Sepsis Labs: No results for input(s): "PROCALCITON", "LATICACIDVEN" in the last 168 hours.  No results found for this or any previous visit (from the past 240 hour(s)).       Radiology Studies: No results found.      Scheduled Meds:  pantoprazole (PROTONIX) IV  40 mg Intravenous Q24H   Continuous Infusions:  dextrose 5 % and 0.9 % NaCl with KCl 20 mEq/L 50 mL/hr at 12/28/21 0830   piperacillin-tazobactam (ZOSYN)  IV 3.375 g (12/28/21 1135)     LOS: 2 days    Time spent: 49 minutes spent on chart review, discussion with nursing staff, consultants, updating family and  interview/physical exam; more than 50% of that time was spent in counseling and/or coordination of care.    Gryffin Altice J [place] DO [facility] Available via Epic secure chat 7am-7pm After these hours, please refer to coverage provider listed on [email or web address] 12/28/2021, 11:41 AM

## 2021-12-28 NOTE — Anesthesia Preprocedure Evaluation (Signed)
Anesthesia Evaluation  Patient identified by MRN, date of birth, ID band Patient awake    Reviewed: Allergy & Precautions, NPO status , Patient's Chart, lab work & pertinent test results  Airway Mallampati: II  TM Distance: >3 FB Neck ROM: Full    Dental  (+) Edentulous Upper, Edentulous Lower, Dental Advisory Given   Pulmonary asthma , pneumonia,    breath sounds clear to auscultation       Cardiovascular hypertension, Pt. on medications and Pt. on home beta blockers + angina + CAD, + Past MI, + CABG and + Peripheral Vascular Disease  + dysrhythmias Atrial Fibrillation + pacemaker + Valvular Problems/Murmurs AS and MR  Rhythm:Regular  Echo 12/2020 1. Left ventricular ejection fraction, by estimation, is 55 to 60%. The left ventricle has normal function. There is mild left ventricular hypertrophy.  2. Right ventricular systolic function is mildly reduced. The right ventricular size is normal.  3. The mitral valve is degenerative. Mild to moderate mitral valve regurgitation. Severe mitral annular calcification.  4. Tricuspid valve regurgitation is mild to moderate.  5. Unable to assess TAVR valve hemodynamically due to limited acoustic windows for imaging. Valve is grossly normal in appearance with trivial central prosthetic AI. No rocking or dehiscence. The aortic valve has been repaired/replaced. Aortic valve regurgitation is trivial. There is a 23 mm Sapien prosthetic (TAVR) valve present in the aortic position. Procedure Date: 01/03/2017.   Pacemaker implant 12/2020 For symptomatic bradycardia, <1% AP, 93% VP   Neuro/Psych PSYCHIATRIC DISORDERS Anxiety CVA    GI/Hepatic Neg liver ROS, hiatal hernia, GERD  Medicated,  Endo/Other  negative endocrine ROS  Renal/GU negative Renal ROS     Musculoskeletal  (+) Arthritis , Fibromyalgia -  Abdominal (+) - obese,   Peds  Hematology  (+) Blood dyscrasia, anemia ,    Anesthesia Other Findings   Reproductive/Obstetrics                             Anesthesia Physical  Anesthesia Plan  ASA: 4  Anesthesia Plan: General   Post-op Pain Management:    Induction: Intravenous  PONV Risk Score and Plan: 3 and Treatment may vary due to age or medical condition, Ondansetron and Dexamethasone  Airway Management Planned: Oral ETT  Additional Equipment: None  Intra-op Plan:   Post-operative Plan: Extubation in OR  Informed Consent: I have reviewed the patients History and Physical, chart, labs and discussed the procedure including the risks, benefits and alternatives for the proposed anesthesia with the patient or authorized representative who has indicated his/her understanding and acceptance.     Dental advisory given  Plan Discussed with: CRNA and Anesthesiologist  Anesthesia Plan Comments: (HPI: [name] is a 88 y.o. female with medical history significant of  CAD s/p CABG, atrial fib s/p MAZE procedure, CVA, carotid artery disease s/p right CEA, fibromyalgia, GERD, mild to moderate MR, severe AS s/p TAVR and sick sinus syndrome s/p pacemaker, diastolic dysfunction,HTN, HLD Asthma who presents with abdominal pain and nausea and dry heaving.   )        Anesthesia Quick Evaluation

## 2021-12-28 NOTE — Op Note (Signed)
[facility] Patient Name: [name] Procedure Date : 12/28/2021 MRN: [id number] Attending MD: Carol Ada , MD Date of Birth: 18-Oct-1928 CSN: [id number] Age: 88 Admit Type: Inpatient Procedure:                ERCP Indications:              Common bile duct stone(s) Providers:                Carol Ada, MD, Glori Bickers, RN, [facility]                            Technician, Technician Referring MD:              Medicines:                General Anesthesia Complications:            No immediate complications. Estimated Blood Loss:     Estimated blood loss: none. Procedure:                Pre-Anesthesia Assessment:                           - Prior to the procedure, a History and Physical                            was performed, and patient medications and                            allergies were reviewed. The patient's tolerance of                            previous anesthesia was also reviewed. The risks                            and benefits of the procedure and the sedation                            options and risks were discussed with the patient.                            All questions were answered, and informed consent                            was obtained. Prior Anticoagulants: The patient has                            taken no previous anticoagulant or antiplatelet                            agents. ASA Grade Assessment: III - A patient with                            severe systemic disease. After reviewing the risks  and benefits, the patient was deemed in                            satisfactory condition to undergo the procedure.                           - Sedation was administered by an anesthesia                            professional. General anesthesia was attained.                           After obtaining informed consent, the scope was                            passed under direct vision. Throughout the                             procedure, the patient's blood pressure, pulse, and                            oxygen saturations were monitored continuously. The                            [id number]-Q190V ([id number] Olympus duodenoscope was                            introduced through the mouth, and used to inject                            contrast into and used to inject contrast into the                            bile duct. The ERCP was accomplished without                            difficulty. The patient tolerated the procedure                            well. Scope In: Scope Out: Findings:      The major papilla was normal. The bile duct was deeply cannulated with       the short-nosed traction sphincterotome. Contrast was injected. I       personally interpreted the bile duct images. There was brisk flow of       contrast through the ducts. Image quality was excellent. Contrast       extended to the entire biliary tree. The lower third of the main bile       duct and middle third of the main bile duct contained filling defect(s)       thought to be a stone. A short 0.035 inch Soft Jagwire was passed into       the biliary tree. A 10 mm biliary sphincterotomy was made with a       traction (standard) sphincterotome using ERBE electrocautery. There was  no post-sphincterotomy bleeding. The biliary tree was swept with a 15 mm       balloon starting at the bifurcation. Three stones were removed. No       stones remained.      Cannulation was achieved during the first attempt. The guidewire was       secured in the right intrahepatic ducts. Contrast injection showed that       the proximal CBD was dilated up to 12-15 mm. Some filling defects were       noted in the mid and distal CBD. A 1 cm sphincterotomy was created and       the CBD was swept multiple times. Approximately 3 black stones were       extracted. The final occlusion cholangiogram was negative for any       retained  stones. Impression:               - The major papilla appeared normal.                           - A filling defect consistent with a stone was seen                            on the cholangiogram.                           - Choledocholithiasis was found. Complete removal                            was accomplished by biliary sphincterotomy and                            balloon extraction.                           - A biliary sphincterotomy was performed.                           - The biliary tree was swept. Recommendation:           - Return patient to hospital ward for ongoing care.                           - Resume regular diet.                           - If stable tomorrow she can be discharged home.                           - Signing off. Procedure Code(s):        --- Professional ---                           [phone], Endoscopic retrograde                            cholangiopancreatography (ERCP); with removal of  calculi/debris from biliary/pancreatic duct(s)                           [phone], Endoscopic retrograde                            cholangiopancreatography (ERCP); with                            sphincterotomy/papillotomy                           [phone], Endoscopic catheterization of the biliary                            ductal system, radiological supervision and                            interpretation Diagnosis Code(s):        --- Professional ---                           K80.50, Calculus of bile duct without cholangitis                            or cholecystitis without obstruction                           R93.2, Abnormal findings on diagnostic imaging of                            liver and biliary tract CPT copyright 2019 [employer]. All rights reserved. The codes documented in this report are preliminary and upon coder review may  be revised to meet current compliance requirements. Carol Ada, MD Carol Ada, MD 12/28/2021 1:32:42 PM This report has been signed electronically. Number of Addenda: 0

## 2021-12-28 NOTE — Progress Notes (Signed)
Pt back on unit. Pt's daughter at bedside. Pt alert & orientedx4, vitals WNL, CBG 83.   Justice Rocher, RN

## 2021-12-28 NOTE — Anesthesia Procedure Notes (Signed)
Procedure Name: Intubation Date/Time: 12/28/2021 12:47 PM  Performed by: Mariea Clonts, CRNAPre-anesthesia Checklist: Patient identified, Emergency Drugs available, Suction available and Patient being monitored Patient Re-evaluated:Patient Re-evaluated prior to induction Oxygen Delivery Method: Circle System Utilized Preoxygenation: Pre-oxygenation with 100% oxygen Induction Type: IV induction Ventilation: Mask ventilation without difficulty Laryngoscope Size: Mac and 3 Grade View: Grade I Tube type: Oral Tube size: 7.0 mm Number of attempts: 1 Airway Equipment and Method: Stylet and Oral airway Placement Confirmation: ETT inserted through vocal cords under direct vision, positive ETCO2 and breath sounds checked- equal and bilateral Tube secured with: Tape Dental Injury: Teeth and Oropharynx as per pre-operative assessment

## 2021-12-28 NOTE — Interval H&P Note (Signed)
History and Physical Interval Note:  12/28/2021 12:31 PM  [name]  has presented today for surgery, with the diagnosis of Choledocholithiasis.  The various methods of treatment have been discussed with the patient and family. After consideration of risks, benefits and other options for treatment, the patient has consented to  Procedure(s): ENDOSCOPIC RETROGRADE CHOLANGIOPANCREATOGRAPHY (ERCP) (N/A) as a surgical intervention.  The patient's history has been reviewed, patient examined, no change in status, stable for surgery.  I have reviewed the patient's chart and labs.  Questions were answered to the patient's satisfaction.     Pernell Lenoir D

## 2021-12-29 ENCOUNTER — Encounter (HOSPITAL_COMMUNITY): Payer: Self-pay | Admitting: Gastroenterology

## 2021-12-29 DIAGNOSIS — K810 Acute cholecystitis: Secondary | ICD-10-CM | POA: Diagnosis not present

## 2021-12-29 LAB — COMPREHENSIVE METABOLIC PANEL
ALT: 16 U/L (ref 0–44)
AST: 15 U/L (ref 15–41)
Albumin: 2.6 g/dL — ABNORMAL LOW (ref 3.5–5.0)
Alkaline Phosphatase: 30 U/L — ABNORMAL LOW (ref 38–126)
Anion gap: 9 (ref 5–15)
BUN: 7 mg/dL — ABNORMAL LOW (ref 8–23)
CO2: 24 mmol/L (ref 22–32)
Calcium: 8.5 mg/dL — ABNORMAL LOW (ref 8.9–10.3)
Chloride: 106 mmol/L (ref 98–111)
Creatinine, Ser: 0.49 mg/dL (ref 0.44–1.00)
GFR, Estimated: >60 mL/min (ref >60)
Glucose, Bld: 236 mg/dL — ABNORMAL HIGH (ref 70–99)
Potassium: 3.8 mmol/L (ref 3.5–5.1)
Sodium: 139 mmol/L (ref 135–145)
Total Bilirubin: 0.4 mg/dL (ref 0.3–1.2)
Total Protein: 5.2 g/dL — ABNORMAL LOW (ref 6.5–8.1)

## 2021-12-29 LAB — CBC
HCT: 34.3 % — ABNORMAL LOW (ref 36.0–46.0)
Hemoglobin: 11.7 g/dL — ABNORMAL LOW (ref 12.0–15.0)
MCH: 30.8 pg (ref 26.0–34.0)
MCHC: 34.1 g/dL (ref 30.0–36.0)
MCV: 90.3 fL (ref 80.0–100.0)
Platelets: 142 10*3/uL — ABNORMAL LOW (ref 150–400)
RBC: 3.8 MIL/uL — ABNORMAL LOW (ref 3.87–5.11)
RDW: 14.6 % (ref 11.5–15.5)
WBC: 6.5 10*3/uL (ref 4.0–10.5)
nRBC: 0 % (ref 0.0–0.2)

## 2021-12-29 LAB — GLUCOSE, CAPILLARY: Glucose-Capillary: 153 mg/dL — ABNORMAL HIGH (ref 70–99)

## 2021-12-29 LAB — LIPASE, BLOOD: Lipase: 36 U/L (ref 11–51)

## 2021-12-29 MED ORDER — PANTOPRAZOLE SODIUM 40 MG PO TBEC
40.0000 mg | DELAYED_RELEASE_TABLET | Freq: Every day | ORAL | Status: DC
  Administered 2021-12-30: 40 mg via ORAL
  Filled 2021-12-29: qty 1

## 2021-12-29 MED ORDER — ENSURE ENLIVE PO LIQD
237.0000 mL | Freq: Two times a day (BID) | ORAL | Status: DC
  Administered 2021-12-29 – 2021-12-30 (×2): 237 mL via ORAL

## 2021-12-29 NOTE — Progress Notes (Signed)
Progress Note Patient: [name] WCH:[id number] DOB: [birth year] DOA: 12/25/2021  DOS: the patient was seen and examined on 12/29/2021  Brief hospital course: PMH of CAD SP CABG, PAF SP maze procedure, CS SP CVA, GERD, moderate MR, severe AS SP TAVR, SSS SP PPM, HTN, HLD, fibromyalgia. Present to the hospital with numbers of abdominal pain.  Found to have acute pancreatitis secondary to gallstone along with Coley cystitis and choledocholithiasis. GI and general surgery were consulted. Underwent ERCP with stone removal. Patient currently too high risk for surgical intervention.  Cardiology was also consulted. Currently being managed conservatively. Assessment and Plan: Acute cholecystitis Choledocholithiasis Gallstone pancreatitis Presents with complaints of abdominal pain. Found to have lipase level more than 3000. General surgery and GI consulted. CT scan shows evidence of biliary ductal dilation as well as choledocholithiasis. Underwent ERCP with removal of the stone. Per GI recommend to hold Eliquis for 3 to 5 days post ERCP. Patient was on IV Zosyn throughout the hospitalization. General surgery no indication for antibiotic. Even though the patient actually has severe protein calorie malnutrition recommend to avoid fat in diet therefore patient will be on heart healthy diet.   Elevated troponin hs troponin 19>23>54>53; plateaued.  Suspect elevation likely secondary to type II demand ischemia in the setting of infection as above.  EKG with atrial fibrillation, rate 99.  On telemetry notably V paced. Cardiology consulted; recommended TTE Palliative care consult.   Hypokalemia Monitor.   CAD s/p CABG Hold statin. Outpatient cardiology consultation. high risk for the surgery   Paroxysmal atrial fibrillation s/p MAZE Follows with electrophysiology outpatient. --Currently holding home Eliquis   CAS s/p CEA --Holding statin as above   GERD Continue PPI.   Hx Severe AS s/p  TAVR TTE 01/15/2021 with noted 23 mm SAPIEN prosthetic valve in the aortic position, unable to assess valve hemodynamically due to limited acoustic windows. --Outpatient follow-up with cardiology   Sick sinus syndrome s/p PPM --Continue monitor on telemetry   Chronic diastolic congestive heart failure Essential hypertension TTE 12/2020 with LVEF 55-60%, LV normal function, mild LVH, RV systolic function mildly reduced, mild/moderate MR, moderate TR, TAVR noted.  Currently not on antihypertensive therapy outpatient. --Continue monitor BP closely in the setting of infection as above   Hyperlipidemia --Holding statin   Asthma --Albuterol neb as needed   Severe protein calorie malnutrition Body mass index is 17.18 kg/m. Significant fat loss and muscle wasting noted on physical exam.  Subjective: No nausea no vomiting no fever no chills.  Still has some abdominal pain in the right upper quadrant area.  Passing gas.  No BM.  Physical Exam: Vitals:   12/29/21 0500 12/29/21 0754 12/29/21 1147 12/29/21 1452  BP:  133/75 (!) 122/96 (!) 106/57  Pulse:  70 70 72  Resp:  14 (!) 25 17  Temp:  99 F (37.2 C) 98.7 F (37.1 C) 98 F (36.7 C)  TempSrc:  Oral Oral Oral  SpO2:  91% 92% 92%  Weight: 43.3 kg     Height:       General: Appear in mild distress; no visible Abnormal Neck Mass Or lumps, Conjunctiva normal Cardiovascular: S1 and S2 Present, aortic systolic  Murmur, Respiratory: good respiratory effort, Bilateral Air entry present and faint Crackles, no wheezes Abdomen: Bowel Sound present, right-sided tenderness Extremities: no Pedal edema Neurology: alert and oriented to time, place, and person  Gait not checked due to patient safety concerns   Data Reviewed: I have Reviewed nursing notes, Vitals, and  Lab results since pt's last encounter. Pertinent lab results CBC and BMP I have ordered test including CBC and BMP I have discussed pt's care plan and test results with general  surgery.   Family Communication: Daughter at bedside  Disposition: Status is: Inpatient Remains inpatient appropriate because: Monitor for diet tolerance.  Pain improvement.  Palliative care consult.  Author: Berle Mull, MD 12/29/2021 7:17 PM  Please look on [email or web address] to find out who is on call.

## 2021-12-29 NOTE — Progress Notes (Signed)
1 Day Post-Op  Subjective: CC: Doing well. Upper abdominal pain resolved. Tolerating regular diet without n/v.   Objective: Vital signs in last 24 hours: Temp:  [97.4 F (36.3 C)-99 F (37.2 C)] 99 F (37.2 C) (08/09 0754) Pulse Rate:  [69-84] 70 (08/09 0754) Resp:  [14-30] 14 (08/09 0754) BP: (111-171)/(57-96) 133/75 (08/09 0754) SpO2:  [90 %-99 %] 91 % (08/09 0754) Weight:  [42.6 kg-43.3 kg] 43.3 kg (08/09 0500) Last BM Date : 12/24/21  Intake/Output from previous day: 08/08 0701 - 08/09 0700 In: 170.1 [I.V.:170.1] Out: 2 [Blood:2] Intake/Output this shift: No intake/output data recorded.  PE: Gen:  Frail, elderly female in NAD, pleasant Abd: Soft, ND, NT, +BS. There is a R inguinal hernia appears to reduce but spontaneously recurs - she reports this is chronic Psych: A&Ox3   Lab Results:  Recent Labs    12/28/21 0307 12/29/21 0238  WBC 8.6 6.5  HGB 11.9* 11.7*  HCT 36.7 34.3*  PLT 132* 142*   BMET Recent Labs    12/28/21 0307 12/29/21 0238  NA 140 139  K 3.6 3.8  CL 107 106  CO2 18* 24  GLUCOSE 50* 236*  BUN 11 7*  CREATININE 0.66 0.49  CALCIUM 9.0 8.5*   PT/INR No results for input(s): "LABPROT", "INR" in the last 72 hours. CMP     Component Value Date/Time   NA 139 12/29/2021 0238   NA 140 10/07/2021 1559   K 3.8 12/29/2021 0238   CL 106 12/29/2021 0238   CO2 24 12/29/2021 0238   GLUCOSE 236 (H) 12/29/2021 0238   BUN 7 (L) 12/29/2021 0238   BUN 7 (L) 10/07/2021 1559   CREATININE 0.49 12/29/2021 0238   CREATININE 0.41 (L) 08/21/2015 0001   CALCIUM 8.5 (L) 12/29/2021 0238   PROT 5.2 (L) 12/29/2021 0238   PROT 6.4 10/07/2021 1559   ALBUMIN 2.6 (L) 12/29/2021 0238   ALBUMIN 4.1 10/07/2021 1559   AST 15 12/29/2021 0238   ALT 16 12/29/2021 0238   ALKPHOS 30 (L) 12/29/2021 0238   BILITOT 0.4 12/29/2021 0238   BILITOT 0.5 10/07/2021 1559   GFRNONAA >60 12/29/2021 0238   GFRAA 100 11/13/2019 1529   Lipase     Component Value  Date/Time   LIPASE 36 12/29/2021 0238    Studies/Results: DG ERCP  Result Date: 12/28/2021 CLINICAL DATA:  88-year-old female with history of abdominal pain, choledocholithiasis. EXAM: ERCP TECHNIQUE: Multiple spot images obtained with the fluoroscopic device and submitted for interpretation post-procedure. FLUOROSCOPY TIME:  Fluoroscopy Time:  2 minute, 54 seconds Radiation Exposure Index (if provided by the fluoroscopic device): 23.3 Number of Acquired Spot Images: 5 COMPARISON:  12/26/2021 FINDINGS: Fluoroscopic images demonstrate retrograde cannulation of the common bile duct the endoscopic approach. Cholangiogram demonstrates moderate dilation of the intra and extrahepatic bile ducts. Balloon sweep was performed. Final image demonstrates slight decompression common bile duct. IMPRESSION: ERCP with cholangiogram demonstrating moderate intra and extrahepatic biliary duct dilation. Balloon sweep was performed. These images were submitted for radiologic interpretation only. Please see the procedural report for the full procedural details, amount of contrast, and the fluoroscopy time utilized. Electronically Signed   By: Ruthann Cancer M.D.   On: 12/28/2021 13:51   ECHOCARDIOGRAM COMPLETE  Result Date: 12/28/2021    ECHOCARDIOGRAM REPORT   Patient Name:   MELICIA ESQUEDA Date of Exam: 12/28/2021 Medical Rec #:  [id number]     Height:       62.0 in Accession #:  [id number]    Weight:       93.9 lb Date of Birth:  January 06, 1929    BSA:          1.386 m Patient Age:    58 years      BP:           114/55 mmHg Patient Gender: F             HR:           70 bpm. Exam Location:  Inpatient Procedure: 2D Echo, Cardiac Doppler and Color Doppler Indications:    Z01.818 Encounter for other preprocedural examination  History:        Patient has prior history of Echocardiogram examinations, most                 recent 01/15/2021. Abnormal ECG and Pacemaker, Stroke, Aortic                 Valve Disease and Mitral Valve Disease,  Arrythmias:Atrial                 Fibrillation and LBBB; Signs/Symptoms:Syncope. Aortic stenosis.                 Aortic Valve: 23 mm Sapien prosthetic, stented (TAVR) valve is                 present in the aortic position. Procedure Date: 01/03/2017.  Sonographer:    Roseanna Rainbow RDCS Referring Phys: Freada Bergeron  Sonographer Comments: Technically difficult study due to poor echo windows and suboptimal apical window. Difficult study due to extremely thin habitus. Attempted to turn. IMPRESSIONS  1. Left ventricular ejection fraction, by estimation, is 40 to 45%. The left ventricle has mildly decreased function. The left ventricle demonstrates regional wall motion abnormalities (see scoring diagram/findings for description). Left ventricular diastolic function could not be evaluated. There is severe hypokinesis of the left ventricular, basal-mid inferolateral wall.  2. Right ventricular systolic function is normal. The right ventricular size is normal. There is moderately elevated pulmonary artery systolic pressure. The estimated right ventricular systolic pressure is 97.6 mmHg.  3. Left atrial size was mildly dilated.  4. Right atrial size was mildly dilated.  5. Multiple jets of mitral insufficiency are seen. The dominant jet is eccentric and posteriorly directed. A centrally directed jet is also seen. The cumulative degree of regurgitation is probably severe. The mitral valve is myxomatous. Severe mitral valve regurgitation. There is holosystolic prolapse of both leaflets of the mitral valve. The mean mitral valve gradient is 4.0 mmHg.  6. The tricuspid valve is myxomatous. Tricuspid valve regurgitation is moderate.  7. The aortic valve has been repaired/replaced. Aortic valve regurgitation is trivial. There is a 23 mm Sapien prosthetic (TAVR) valve present in the aortic position. Procedure Date: 01/03/2017. Aortic valve mean gradient measures 7.7 mmHg. Aortic valve Vmax measures 1.96 m/s. Aortic valve  acceleration time measures 50 msec.  8. Pulmonic valve regurgitation is moderate.  9. The inferior vena cava is dilated in size with <50% respiratory variability, suggesting right atrial pressure of 15 mmHg. Comparison(s): The left ventricular function is worsened. The left ventricular wall motion abnormality is new. Mitral regurgitation is worse (may have been previously underestimated). FINDINGS  Left Ventricle: Left ventricular ejection fraction, by estimation, is 40 to 45%. The left ventricle has mildly decreased function. The left ventricle demonstrates regional wall motion abnormalities. Severe hypokinesis of the left ventricular, basal-mid inferolateral wall. The left ventricular internal cavity  size was normal in size. There is no left ventricular hypertrophy. Abnormal (paradoxical) septal motion, consistent with RV pacemaker. Left ventricular diastolic function could not be evaluated due  to atrial fibrillation. Left ventricular diastolic function could not be evaluated.  LV Wall Scoring: The posterior wall is hypokinetic. Right Ventricle: The right ventricular size is normal. No increase in right ventricular wall thickness. Right ventricular systolic function is normal. There is moderately elevated pulmonary artery systolic pressure. The tricuspid regurgitant velocity is 3.11 m/s, and with an assumed right atrial pressure of 15 mmHg, the estimated right ventricular systolic pressure is 93.8 mmHg. Left Atrium: Left atrial size was mildly dilated. Right Atrium: Right atrial size was mildly dilated. Pericardium: There is no evidence of pericardial effusion. Mitral Valve: Multiple jets of mitral insufficiency are seen. The dominant jet is eccentric and posteriorly directed. A centrally directed jet is also seen. The cumulative degree of regurgitation is probably severe. The mitral valve is myxomatous. There is holosystolic prolapse of both leaflets of the mitral valve. There is moderate thickening of the  mitral valve leaflet(s). There is mild calcification of the mitral valve leaflet(s). Severe mitral valve regurgitation. MV peak gradient, 13.2 mmHg. The mean mitral valve gradient is 4.0 mmHg with average heart rate of 90 bpm. Tricuspid Valve: The tricuspid valve is myxomatous. Tricuspid valve regurgitation is moderate. Aortic Valve: The aortic valve has been repaired/replaced. Aortic valve regurgitation is trivial. Aortic valve mean gradient measures 7.7 mmHg. Aortic valve peak gradient measures 15.3 mmHg. Aortic valve area, by VTI measures 3.54 cm. There is a 23 mm Sapien prosthetic, stented (TAVR) valve present in the aortic position. Procedure Date: 01/03/2017. Pulmonic Valve: The pulmonic valve was grossly normal. Pulmonic valve regurgitation is moderate. Aorta: The aortic root and ascending aorta are structurally normal, with no evidence of dilitation. Venous: The inferior vena cava is dilated in size with less than 50% respiratory variability, suggesting right atrial pressure of 15 mmHg. IAS/Shunts: No atrial level shunt detected by color flow Doppler. Additional Comments: A device lead is visualized in the right atrium and right ventricle.  LEFT VENTRICLE PLAX 2D LVIDd:         3.30 cm     Diastology LVIDs:         2.70 cm     LV e' medial:    3.23 cm/s LV PW:         0.90 cm     LV E/e' medial:  54.5 LV IVS:        1.00 cm     LV e' lateral:   3.32 cm/s LVOT diam:     2.30 cm     LV E/e' lateral: 53.0 LV SV:         132 LV SV Index:   95 LVOT Area:     4.15 cm  LV Volumes (MOD) LV vol d, MOD A2C: 55.5 ml LV vol d, MOD A4C: 44.7 ml LV vol s, MOD A2C: 24.1 ml LV vol s, MOD A4C: 21.3 ml LV SV MOD A2C:     31.4 ml LV SV MOD A4C:     44.7 ml LV SV MOD BP:      28.0 ml IVC IVC diam: 2.00 cm LEFT ATRIUM             Index        RIGHT ATRIUM           Index LA Vol (A2C):   26.4 ml 19.04 ml/m  RA  Area:     10.30 cm LA Vol (A4C):   32.2 ml 23.23 ml/m  RA Volume:   23.90 ml  17.24 ml/m LA Biplane Vol: 31.1 ml  22.43 ml/m  AORTIC VALVE                     PULMONIC VALVE AV Area (Vmax):    3.50 cm      PR End Diast Vel: 2.37 msec AV Area (Vmean):   3.31 cm AV Area (VTI):     3.54 cm AV Vmax:           195.67 cm/s AV Vmean:          128.000 cm/s AV VTI:            0.372 m AV Peak Grad:      15.3 mmHg AV Mean Grad:      7.7 mmHg LVOT Vmax:         165.00 cm/s LVOT Vmean:        102.000 cm/s LVOT VTI:          0.317 m LVOT/AV VTI ratio: 0.85  AORTA Ao Root diam: 3.20 cm Ao Asc diam:  2.70 cm MITRAL VALVE                  TRICUSPID VALVE MV Area (PHT): 3.21 cm       TR Peak grad:   38.7 mmHg MV Area VTI:   3.47 cm       TR Vmax:        311.00 cm/s MV Peak grad:  13.2 mmHg MV Mean grad:  4.0 mmHg       SHUNTS MV Vmax:       1.82 m/s       Systemic VTI:  0.32 m MV Vmean:      88.5 cm/s      Systemic Diam: 2.30 cm MV Decel Time: 236 msec MR Peak grad:    134.1 mmHg MR Mean grad:    91.0 mmHg MR Vmax:         579.00 cm/s MR Vmean:        449.0 cm/s MR PISA:         1.01 cm MR PISA Eff ROA: 7 mm MR PISA Radius:  0.40 cm MV E velocity: 176.00 cm/s Dani Gobble Croitoru MD Electronically signed by Sanda Klein MD Signature Date/Time: 12/28/2021/11:53:05 AM    Final     Anti-infectives: Anti-infectives (From admission, onward)    Start     Dose/Rate Route Frequency Ordered Stop   12/26/21 1200  piperacillin-tazobactam (ZOSYN) IVPB 3.375 g        3.375 g 12.5 mL/hr over 240 Minutes Intravenous Every 8 hours 12/26/21 1133     12/26/21 0130  piperacillin-tazobactam (ZOSYN) IVPB 3.375 g        3.375 g 100 mL/hr over 30 Minutes Intravenous  Once 12/26/21 0127 12/26/21 0247        Assessment/Plan Biliary Pancreatitis - S/p ERCP 8/8 by GI with complete removal accomplished by biliary sphincterotomy, balloon extraction and biliary sphincterotomy. Lipase and LFT's non-elevated.  - Suspect wall thickening was related to peripancreatic inflammation. She has been covered with abx while inpatient. Her pain has  improved/resolved, NT to her upper abdomen on exam, she is tolerating a diet without n/v, wbc wnl and afebrile/HDS. I do not think she needs any further abx at this time. Cards consulted for risk stratification 8/7 -- she has a  15% 30 day risk death, MI, or cardiac arrest after surgery (risk index did not include the patients advanced age or her frailty/severe protein calorie malnutrition). We discussed non-operative management and observation, accepting the risk of recurrent issues such as recurrent Choledocholithiasis, Pancreatitis, Symptomatic Cholelithiasis, Acute Cholecystitis and associated complication from above. She is in agreement with plan for non-operative and observation. Okay for d/c from our standpoint with strict return/call back precautions   FEN - HH VTE - SCDs, okay for chem ppx from our standpoint ID - Zosyn 8/6 >> okay to d/c from our standpoint   Hx CHF HTN SSS s/p PPM Hx AS s/p TAVR GERD CAD s/p CABG  CAS s/p R carotid endarterectomy    LOS: 3 days    Jillyn Ledger , [facility] 12/29/2021, 10:43 AM Please see Amion for pager number during day hours 7:00am-4:30pm

## 2021-12-29 NOTE — Hospital Course (Signed)
PMH of CAD SP CABG, PAF SP maze procedure, CS SP CVA, GERD, moderate MR, severe AS SP TAVR, SSS SP PPM, HTN, HLD, fibromyalgia. Present to the hospital with numbers of abdominal pain.  Found to have acute pancreatitis secondary to gallstone along with Coley cystitis and choledocholithiasis. GI and general surgery were consulted. Underwent ERCP with stone removal. Patient currently too high risk for surgical intervention.  Cardiology was also consulted. Currently being managed conservatively.

## 2021-12-29 NOTE — Care Management Important Message (Signed)
Important Message  Patient Details  Name: [name] MRN: [id number] Date of Birth: 01/08/1929   Medicare Important Message Given:  Yes     Hannah Beat 12/29/2021, 11:19 AM

## 2021-12-30 DIAGNOSIS — K810 Acute cholecystitis: Secondary | ICD-10-CM | POA: Diagnosis not present

## 2021-12-30 DIAGNOSIS — R627 Adult failure to thrive: Secondary | ICD-10-CM

## 2021-12-30 DIAGNOSIS — Z515 Encounter for palliative care: Secondary | ICD-10-CM

## 2021-12-30 DIAGNOSIS — E43 Unspecified severe protein-calorie malnutrition: Secondary | ICD-10-CM

## 2021-12-30 DIAGNOSIS — Z66 Do not resuscitate: Secondary | ICD-10-CM

## 2021-12-30 DIAGNOSIS — R531 Weakness: Secondary | ICD-10-CM

## 2021-12-30 LAB — CBC
HCT: 38.2 % (ref 36.0–46.0)
Hemoglobin: 12.6 g/dL (ref 12.0–15.0)
MCH: 30.4 pg (ref 26.0–34.0)
MCHC: 33 g/dL (ref 30.0–36.0)
MCV: 92.3 fL (ref 80.0–100.0)
Platelets: 150 10*3/uL (ref 150–400)
RBC: 4.14 MIL/uL (ref 3.87–5.11)
RDW: 14.6 % (ref 11.5–15.5)
WBC: 6.6 10*3/uL (ref 4.0–10.5)
nRBC: 0 % (ref 0.0–0.2)

## 2021-12-30 LAB — COMPREHENSIVE METABOLIC PANEL
ALT: 17 U/L (ref 0–44)
AST: 17 U/L (ref 15–41)
Albumin: 2.7 g/dL — ABNORMAL LOW (ref 3.5–5.0)
Alkaline Phosphatase: 32 U/L — ABNORMAL LOW (ref 38–126)
Anion gap: 5 (ref 5–15)
BUN: 7 mg/dL — ABNORMAL LOW (ref 8–23)
CO2: 28 mmol/L (ref 22–32)
Calcium: 8.9 mg/dL (ref 8.9–10.3)
Chloride: 107 mmol/L (ref 98–111)
Creatinine, Ser: 0.44 mg/dL (ref 0.44–1.00)
GFR, Estimated: >60 mL/min (ref >60)
Glucose, Bld: 112 mg/dL — ABNORMAL HIGH (ref 70–99)
Potassium: 3.6 mmol/L (ref 3.5–5.1)
Sodium: 140 mmol/L (ref 135–145)
Total Bilirubin: 0.3 mg/dL (ref 0.3–1.2)
Total Protein: 5.4 g/dL — ABNORMAL LOW (ref 6.5–8.1)

## 2021-12-30 LAB — GLUCOSE, CAPILLARY: Glucose-Capillary: 96 mg/dL (ref 70–99)

## 2021-12-30 MED ORDER — OXYCODONE HCL 5 MG PO TABS: 2.5000 mg | ORAL_TABLET | Freq: Two times a day (BID) | ORAL | 0 refills | Status: DC | PRN

## 2021-12-30 MED ORDER — APIXABAN 2.5 MG PO TABS: 2.5000 mg | ORAL_TABLET | Freq: Two times a day (BID) | ORAL | Status: DC

## 2021-12-30 MED ORDER — ROSUVASTATIN CALCIUM 20 MG PO TABS: 20.0000 mg | ORAL_TABLET | Freq: Every day | ORAL | 0 refills | Status: DC

## 2021-12-30 NOTE — Evaluation (Signed)
Physical Therapy Evaluation Patient Details Name: [name] MRN: [id number] DOB: 06-Aug-1928 Today's Date: 12/30/2021  History of Present Illness  88 y.o. female s/p ERCP.  Medical history significant of  CAD s/p CABG, atrial fib s/p MAZE procedure, CVA, carotid artery disease s/p right CEA, fibromyalgia, GERD, mild to moderate due to Choledocholithiasis. MR, severe AS s/p TAVR and sick sinus syndrome s/p pacemaker, diastolic dysfunction,HTN, HLD Asthma with medical history significant of  CAD s/p CABG, atrial fib s/p MAZE procedure, CVA, carotid artery disease s/p right CEA, fibromyalgia, GERD, mild to moderate MR, severe AS s/p TAVR and sick sinus syndrome s/p pacemaker, diastolic dysfunction,HTN, HLD Asthma.   Clinical Impression  Pt in bed upon arrival of PT, agreeable to evaluation at this time. Prior to admission the pt was mobilizing with use of transport chair in the home, but was able to transfer from bed-WC with supervision from daughter and able to complete 2 stairs to enter home with rail and supervision from daughter. The pt now presents with limitations in functional mobility, strength, power, stability, and endurance due to above dx, and will continue to benefit from skilled PT to address these deficits. The pt required minA of 1-2 to manage small lateral steps to recliner from bed with use of HHA, and minA of 1 to manage ~5 ft forwards ambulation with RW. The pt demos improved stability with RW, but remains limited by kyphosis and chronic deconditioning. I am most concerned about her ability to manage the 2 steps to enter the house, and therefore much of the session was spent discussing safe technique with the daughter regarding use of rail, gait belt, chair for seated rest, and support. The pt's daughter reports no other family or assist to manage stairs. Will continue to follow acutely to progress independence with transfers and stair management. Will need HHPT to progress towards prior  level of independence with mobility.        Recommendations for follow up therapy are one component of a multi-disciplinary discharge planning process, led by the attending physician.  Recommendations may be updated based on patient status, additional functional criteria and insurance authorization.  Follow Up Recommendations Home health PT      Assistance Recommended at Discharge Frequent or constant Supervision/Assistance  Patient can return home with the following  A lot of help with walking and/or transfers;A little help with bathing/dressing/bathroom;Assistance with cooking/housework;Assistance with feeding;Direct supervision/assist for medications management;Direct supervision/assist for financial management;Assist for transportation;Help with stairs or ramp for entrance    Equipment Recommendations None recommended by PT  Recommendations for Other Services       Functional Status Assessment Patient has had a recent decline in their functional status and demonstrates the ability to make significant improvements in function in a reasonable and predictable amount of time.     Precautions / Restrictions Precautions Precautions: Fall Precaution Comments: HOH, WC bound for 2 years Restrictions Weight Bearing Restrictions: No      Mobility  Bed Mobility Overal bed mobility: Needs Assistance Bed Mobility: Supine to Sit     Supine to sit: Min assist     General bed mobility comments: minA to reach EOB, pt then able to scoot    Transfers Overall transfer level: Needs assistance Equipment used: 2 person hand held assist, Rolling walker (2 wheels) Transfers: Sit to/from Stand, Bed to chair/wheelchair/BSC Sit to Stand: Min assist, +2 safety/equipment Stand pivot transfers: Min assist         General transfer comment: minA of  1-2 to complete. pt states she can step to recliner (3 ft) but then with significant anxiety and needed chair moved closer for pivot. unable to  achieve full stand, daughter reports this is normal. minA to rise and steady    Ambulation/Gait Ambulation/Gait assistance: Min assist Gait Distance (Feet): 5 Feet Assistive device: Rolling walker (2 wheels) Gait Pattern/deviations: Step-to pattern, Decreased stride length, Shuffle, Trunk flexed Gait velocity: decreased Gait velocity interpretation: <1.31 ft/sec, indicative of household ambulator   General Gait Details: pt with small shuffling steps with significant trunk flexion and miNA to steady limited endurance to 5 ft      Balance Overall balance assessment: Needs assistance   Sitting balance-[name] Scale: Fair     Standing balance support: Bilateral upper extremity supported, During functional activity Standing balance-[name] Scale: Poor Standing balance comment: dependent on BUE support and miNA                             Pertinent Vitals/Pain Pain Assessment Pain Assessment: Faces Faces Pain Scale: Hurts a little bit Pain Location: generalized Pain Descriptors / Indicators: Discomfort Pain Intervention(s): Limited activity within patient's tolerance, Monitored during session    Home Living Family/patient expects to be discharged to:: Private residence Living Arrangements: Children Available Help at Discharge: Family Type of Home: House Home Access: Stairs to enter Entrance Stairs-Rails: Can reach both;Right;Left Entrance Stairs-Number of Steps: 2   Home Layout: One level Home Equipment: Conservation officer, nature (2 wheels);Cane - single point;Wheelchair - manual;BSC/3in1;Other (comment) (transport chair able to fit into bed room) Additional Comments: pt doesn't go into the bathroom, she uses her BSC and does sponge baths. Daughter helps wtih bathing and provides S for stairs. Pt uses transport chair when out of the house    Prior Function Prior Level of Function : Needs assist       Physical Assist : Mobility (physical);ADLs (physical) Mobility (physical):  Transfers;Gait;Stairs ADLs (physical): Bathing;Dressing Mobility Comments: transfers with supervision from bed-WC. uses transport chair in the house. can climb stairs with assist from daughter ADLs Comments: assist with ADL as needed; uses briefs     Hand Dominance   Dominant Hand: Right    Extremity/Trunk Assessment   Upper Extremity Assessment Upper Extremity Assessment: Defer to OT evaluation;Generalized weakness    Lower Extremity Assessment Lower Extremity Assessment: Generalized weakness    Cervical / Trunk Assessment Cervical / Trunk Assessment: Kyphotic;Other exceptions Cervical / Trunk Exceptions: significant kyphosis. pt standing with ~90 deg trunk flexion  Communication   Communication: HOH  Cognition Arousal/Alertness: Awake/alert Behavior During Therapy: WFL for tasks assessed/performed Overall Cognitive Status:  (most likely at baseline; tangential)                                 General Comments: limited by [facility] at times, no hx of dementia but both pt and daughter report forgetfulness is normal. The pt is likely close to baseline. poor awareness of safety and need for additional assist after surgery        General Comments General comments (skin integrity, edema, etc.): VSS on RA        Assessment/Plan    PT Assessment Patient needs continued PT services  PT Problem List Decreased strength;Decreased range of motion;Decreased activity tolerance;Decreased balance;Decreased mobility;Decreased coordination;Decreased cognition;Decreased safety awareness;Pain       PT Treatment Interventions DME instruction;Gait training;Stair training;Functional mobility training;Therapeutic activities;Therapeutic exercise;Balance  training;Patient/family education    PT Goals (Current goals can be found in the Care Plan section)  Acute Rehab PT Goals Patient Stated Goal: return home PT Goal Formulation: With patient Time For Goal Achievement:  01/13/22 Potential to Achieve Goals: Good    Frequency Min 3X/week     Co-evaluation PT/OT/SLP Co-Evaluation/Treatment: Yes Reason for Co-Treatment: Complexity of the patient's impairments (multi-system involvement);Necessary to address cognition/behavior during functional activity;For patient/therapist safety;To address functional/ADL transfers PT goals addressed during session: Mobility/safety with mobility;Balance;Proper use of DME;Strengthening/ROM OT goals addressed during session: ADL's and self-care;Strengthening/ROM       AM-PAC PT "6 Clicks" Mobility  Outcome Measure Help needed turning from your back to your side while in a flat bed without using bedrails?: A Little Help needed moving from lying on your back to sitting on the side of a flat bed without using bedrails?: A Little Help needed moving to and from a bed to a chair (including a wheelchair)?: A Little Help needed standing up from a chair using your arms (e.g., wheelchair or bedside chair)?: A Little Help needed to walk in hospital room?: Total (unable to ambulate > 5 ft) Help needed climbing 3-5 steps with a railing? : A Lot 6 Click Score: 15    End of Session Equipment Utilized During Treatment: Gait belt Activity Tolerance: Patient tolerated treatment well;Patient limited by fatigue Patient left: in chair;with call bell/phone within reach;with chair alarm set;with family/visitor present Nurse Communication: Mobility status PT Visit Diagnosis: Other abnormalities of gait and mobility (R26.89);Muscle weakness (generalized) (M62.81);History of falling (Z91.81)    Time: 5909-3112 PT Time Calculation (min) (ACUTE ONLY): 29 min   Charges:   PT Evaluation $PT Eval Moderate Complexity: 1 Mod          [place] Carbo, PT, DPT   Acute Rehabilitation Department  Sandra Cockayne 12/30/2021, 12:20 PM

## 2021-12-30 NOTE — Consult Note (Signed)
Consultation Note Date: 12/30/2021   Patient Name: [name]  DOB: [birth year]  MRN: [id number]  Age / Sex: 88 y.o., female  PCP: Denita Lung, MD Referring Physician: Lavina Hamman, MD  Reason for Consultation: Establishing goals of care and Psychosocial/spiritual support  HPI/Patient Profile: 88 y.o. female   admitted on 12/25/2021 with  PMH of CAD SP CABG, PAF SP maze procedure, CS SP CVA, GERD, moderate MR, severe AS SP TAVR, SSS SP PPM, HTN, HLD, fibromyalgia.  Present to the hospital with numbers of abdominal pain.  Found to have acute pancreatitis secondary to gallstone along with Coley cystitis and choledocholithiasis.  GI and general surgery were consulted. Underwent ERCP with stone removal.  Patient currently too high risk for surgical intervention.  Cardiology was also consulted.  Patient is progressing well,      Plan is for discharge home with daughter.    Clinical Assessment and Goals of Care:  This NP Wadie Lessen reviewed medical records, received report from team, assessed the patient and then meet at the patient's bedside along with her daughter to discuss diagnosis, prognosis, GOC, EOL wishes disposition and options.   Concept of Palliative Care was introduced as specialized medical care for people and their families living with serious illness.  If focuses on providing relief from the symptoms and stress of a serious illness.  The goal is to improve quality of life for both the patient and the family. Values and goals of care important to patient and family were attempted to be elicited.  Created space and opportunity for patient  and family to explore thoughts and feelings regarding current medical situation.  Both patient and daughter verbalized an understanding the patient continues to "weaken".  The most important thing for the patient is to be at home.   Patient lives at home  and her daughter is her main caregiver  Education offered on home health services versus home hospice services    A  discussion was had today regarding advanced directives.  Concepts specific to code status, artifical feeding and hydration, continued IV antibiotics and rehospitalization was had.  The difference between a aggressive medical intervention path  and a palliative comfort care path for this patient at this time was had.     MOST form completed.    Priority of care is comfort and dignity   Natural trajectory and expectations at EOL were discussed.  Questions and concerns addressed.  Patient  encouraged to call with questions or concerns.     PMT will continue to support holistically.     No documented H POA, patient's daughter [name] is patient's main support person and caregiver and decision maker in the event the patient cannot make her own decisions for herself.  Education offered on the importance of continued conversation between patient and daughter and their caregivers ensuring that decisions are made in the patient's best interest and are patient centered in nature..      SUMMARY OF RECOMMENDATIONS    Code Status/Advance  Care Planning: DNR-documented today   Palliative Prophylaxis:  Frequent Pain Assessment  Additional Recommendations (Limitations, Scope, Preferences): Avoid Hospitalization  Psycho-social/Spiritual:  Desire for further Chaplaincy support:no Additional Recommendations: Education on Hospice  Prognosis:  Unable to determine  Discharge Planning: Home with Home Health      Primary Diagnoses: Present on Admission:  Acute cholecystitis   I have reviewed the medical record, interviewed the patient and family, and examined the patient. The following aspects are pertinent.  Past Medical History:  Diagnosis Date   Anxiety    Aortic Stenosis s/p TAVR    Echo 10/21: EF 55-60, no RWMA, mild asymmetric LVH, normal RVSF, RVSP 40.2 mmHg,  mild MR, s/p TAVR with mean gradient 8.110mHg, no PVL   Arthritis    OSTEO   Aspirin allergy    on Plavix   Asthma    Bronchitis    CAD (coronary artery disease)    a. s/p CABG 2006 with Cox-Maze procedure.   [place]    [place]. s/p R CEA.   Chronic stable angina (HCC)    Diastolic dysfunction    Eczema    Fibromyalgia    GERD (gastroesophageal reflux disease)    Heart murmur    Hiatal hernia    Hyperlipidemia    Hypertension    Kyphoscoliosis    Mitral regurgitation    Myocardial infarction ([facility] 2003   Stent to CFX   PA[place]oxysmal [place]) [place]    [place]. pt has h/o hematuria on Eliquis and has since refused anticoagulation   Pneumonia 2015 ?   Pulmonary regurgitation    Skin cancer of arm    Stroke ([facility]    Tricuspid regurgitation    Social History   Socioeconomic History   Marital status: Widowed    Spouse name: Not on file   Number of children: 3   Years of education: Not on file   Highest education level: Not on file  Occupational History    Employer: RETIRED  Tobacco Use   Smoking status: Never   Smokeless tobacco: Former    Types: Snuff    Quit date: 05/23/2001   Tobacco comments:    Former Snuff (02/04/2021)  Vaping Use   Vaping Use: Never used  Substance and Sexual Activity   Alcohol use: No   Drug use: No   Sexual activity: Not Currently  Other Topics Concern   Not on file  Social History Narrative   Not on file   Social Determinants of Health   Financial Resource Strain: Low Risk  (04/09/2021)   Overall Financial Resource Strain (CARDIA)    Difficulty of Paying Living Expenses: Not hard at all  Food Insecurity: No Food Insecurity (04/09/2021)   Hunger Vital Sign    Worried About Running Out of Food in the Last Year: Never true    [place] the Last Year: Never true  Transportation Needs: No Transportation Needs (04/09/2021)   PRAPARE - THydrologist(Medical): No    Lack of  Transportation (Non-Medical): No  Physical Activity: Insufficiently Active (04/09/2021)   Exercise Vital Sign    Days of Exercise per Week: 2 days    Minutes of Exercise per Session: 10 min  Stress: No Stress Concern Present (04/09/2021)   [place]   Feeling of Stress : Not at all  Social Connections: Not on file   Family History  Problem  Relation Age of Onset   Heart failure Mother    Other Father    Scheduled Meds:  feeding supplement  237 mL Oral BID BM   pantoprazole  40 mg Oral Daily   Continuous Infusions: PRN Meds:.acetaminophen **OR** acetaminophen, albuterol, fentaNYL (SUBLIMAZE) injection, haloperidol lactate, lip balm, nitroGLYCERIN, ondansetron **OR** ondansetron (ZOFRAN) IV, oxyCODONE Medications Prior to Admission:  Prior to Admission medications   Medication Sig Start Date End Date Taking? Authorizing Provider  acetaminophen (TYLENOL) 650 MG CR tablet Take 650-1,300 mg by mouth 2 (two) times daily as needed for pain.   Yes [provider]  Carboxymethylcellulose Sodium (EYE DROPS OP) Apply 2 drops to eye daily as needed (gritty eyes).   Yes [provider]  Cholecalciferol (VITAMIN D-3) 25 MCG (1000 UT) CAPS Take 1,000 Units by mouth daily.   Yes [provider]  docusate sodium (COLACE) 100 MG capsule Take 100 mg by mouth daily.   Yes [provider]  Ensure (ENSURE) Take 237 mLs by mouth every other day.   Yes [provider]  fluticasone (FLONASE) 50 MCG/ACT nasal spray SHAKE LIQUID AND USE 2 SPRAYS IN EACH NOSTRIL EVERY DAY Patient taking differently: Place 2 sprays into both nostrils daily. 02/10/21  Yes Medina-Vargas, Monina C, NP  fluticasone (FLOVENT HFA) 220 MCG/ACT inhaler INHALE 2 PUFFS INTO THE LUNGS TWICE DAILY Patient taking differently: Inhale 2 puffs into the lungs daily in the afternoon. 07/14/21  Yes Denita Lung, MD  guaiFENesin  (MUCINEX) 600 MG 12 hr tablet Take 1 tablet (600 mg total) by mouth 2 (two) times daily. 05/25/14  Yes Regalado, Belkys A, MD  LINZESS 72 MCG capsule Take 1 capsule (72 mcg total) by mouth daily. 02/10/21  Yes Medina-Vargas, Monina C, NP  loratadine (CLARITIN) 10 MG tablet Take 1 tablet (10 mg total) by mouth daily. 02/10/21  Yes Medina-Vargas, Monina C, NP  nitroGLYCERIN (NITROSTAT) 0.4 MG SL tablet DISSOLVE 1 TABLET UNDER THE TONGUE EVERY 5 MINUTES AS NEEDED FOR CHEST PAIN Patient taking differently: Place 0.4 mg under the tongue every 5 (five) minutes as needed for chest pain. 11/01/21  Yes Burnell Blanks, MD  OVER THE COUNTER MEDICATION Take by mouth daily as needed (cough). Guaifenesin liquid PO 1 sip by mouth daily as needed for cough   Yes [provider]  pantoprazole (PROTONIX) 40 MG tablet TAKE 1 TABLET BY MOUTH TWICE DAILY Patient taking differently: Take 40 mg by mouth 2 (two) times daily. 07/08/21  Yes Denita Lung, MD  VENTOLIN HFA 108 (90 Base) MCG/ACT inhaler INHALE 2 PUFFS INTO THE LUNGS EVERY 6 HOURS AS NEEDED FOR WHEEZING OR SHORTNESS OF BREATH Patient taking differently: Inhale 1-2 puffs into the lungs as needed for shortness of breath or wheezing. 07/15/21  Yes Denita Lung, MD  Wheat Dextrin (BENEFIBER PO) Take 1 Scoop by mouth daily as needed (fiber).   Yes [provider]  apixaban (ELIQUIS) 2.5 MG TABS tablet Take 1 tablet (2.5 mg total) by mouth 2 (two) times daily. 01/03/22   Lavina Hamman, MD  oxyCODONE (OXY IR/ROXICODONE) 5 MG immediate release tablet Take 0.5 tablets (2.5 mg total) by mouth every 12 (twelve) hours as needed for moderate pain or severe pain. 12/30/21   Lavina Hamman, MD  polyethylene glycol powder (MIRALAX) 17 GM/SCOOP powder Take 17 g by mouth 2 (two) times daily as needed for moderate constipation. Patient not taking: Reported on 10/07/2021 02/10/21   Medina-Vargas, Senaida Lange, NP  rosuvastatin (CRESTOR) 20 MG tablet Take 1  tablet (20 mg total) by mouth daily. 12/30/21   Lavina Hamman, MD   Allergies  Allergen Reactions   Other Other (See Comments)    NO ACIDIC, TART< OR SPICY FOODS- DEVELOPS REFLUX OFTEN!!  PATIENT HAS TROUBLE SWALLOWING TABLETS!!   Bactrim [Sulfamethoxazole-Trimethoprim] Other (See Comments)    "makes her feel funny" or "unsteady"    Amitriptyline Hcl Rash   Aspirin Hives, Swelling, Rash and Other (See Comments)    Body became swollen   Tape Other (See Comments)    SKIN IS VERY THIN- WILL TEAR EASILY!!   Zetia [Ezetimibe] Rash   Review of Systems  Physical Exam  Vital Signs: BP 132/61 (BP Location: Right Arm)   Pulse 70   Temp 98.5 F (36.9 C) (Oral)   Resp 17   Ht '5\' 2"'$  (1.575 m)   Wt 43.1 kg   SpO2 94%   BMI 17.38 kg/m  Pain Scale: 0-10   Pain Score: 0-No pain   SpO2: SpO2: 94 % O2 Device:SpO2: 94 % O2 Flow Rate: .O2 Flow Rate (L/min): 4 L/min  IO: Intake/output summary:  Intake/Output Summary (Last 24 hours) at 12/30/2021 1235 Last data filed at 12/30/2021 1001 Gross per 24 hour  Intake 550 ml  Output 2250 ml  Net -1700 ml    LBM: Last BM Date : 12/24/21 Baseline Weight: Weight: 41 kg Most recent weight: Weight: 43.1 kg     Palliative Assessment/Data:      Signed by: Wadie Lessen, NP   Please contact Palliative Medicine Team phone at [phone] for questions and concerns.  For individual provider: See Shea Evans

## 2021-12-30 NOTE — Evaluation (Signed)
Occupational Therapy Evaluation Patient Details Name: [name] MRN: [id number] DOB: 11-10-1928 Today's Date: 12/30/2021   History of Present Illness 88 y.o. female s/p ERCP.  Medical history significant of  CAD s/p CABG, atrial fib s/p MAZE procedure, CVA, carotid artery disease s/p right CEA, fibromyalgia, GERD, mild to moderate due to Choledocholithiasis. MR, severe AS s/p TAVR and sick sinus syndrome s/p pacemaker, diastolic dysfunction,HTN, HLD Asthma with medical history significant of  CAD s/p CABG, atrial fib s/p MAZE procedure, CVA, carotid artery disease s/p right CEA, fibromyalgia, GERD, mild to moderate MR, severe AS s/p TAVR and sick sinus syndrome s/p pacemaker, diastolic dysfunction,HTN, HLD Asthma   Clinical Impression   PTA pt lives at home with her daughter who  assists with mobility and ADL tasks as needed. Pt primarily uses her transport chair for mobility. Discussed need for assistance to get her Mom into the house - recommend non-emergent ambulance transport to reduce risk of falls. Recommend direct assistance with all mobility and ADL tasks and HHOT VSS RA. Will follow.      Recommendations for follow up therapy are one component of a multi-disciplinary discharge planning process, led by the attending physician.  Recommendations may be updated based on patient status, additional functional criteria and insurance authorization.   Follow Up Recommendations  Home health OT    Assistance Recommended at Discharge Frequent or constant Supervision/Assistance  Patient can return home with the following A lot of help with walking and/or transfers;A lot of help with bathing/dressing/bathroom;Assistance with cooking/housework;Direct supervision/assist for medications management;Direct supervision/assist for financial management;Assist for transportation;Help with stairs or ramp for entrance    Functional Status Assessment  Patient has had a recent decline in their functional  status and demonstrates the ability to make significant improvements in function in a reasonable and predictable amount of time.  Equipment Recommendations  None recommended by OT    Recommendations for Other Services       Precautions / Restrictions Precautions Precautions: Fall      Mobility Bed Mobility Overal bed mobility: Needs Assistance Bed Mobility: Supine to Sit     Supine to sit: Min assist          Transfers Overall transfer level: Needs assistance   Transfers: Sit to/from Stand, Bed to chair/wheelchair/BSC Sit to Stand: Min assist, +2 safety/equipment Stand pivot transfers: Min assist                Balance Overall balance assessment: Needs assistance   Sitting balance-[name] Scale: Fair       Standing balance-[name] Scale: Poor                             ADL either performed or assessed with clinical judgement   ADL Overall ADL's : Needs assistance/impaired     Grooming: Set up   Upper Body Bathing: Minimal assistance   Lower Body Bathing: Moderate assistance   Upper Body Dressing : Minimal assistance   Lower Body Dressing: Moderate assistance;Sit to/from stand   Toilet Transfer: Minimal assistance;+2 for safety/equipment   Toileting- Clothing Manipulation and Hygiene: Moderate assistance       Functional mobility during ADLs: Minimal assistance;+2 for safety/equipment;Rolling walker (2 wheels) General ADL Comments: does better with RW; daughter can assist with ADL tasks     Vision         Perception     Praxis      Pertinent Vitals/Pain Pain Assessment Pain Assessment:  Faces Faces Pain Scale: Hurts a little bit Pain Location: generalized Pain Descriptors / Indicators: Discomfort Pain Intervention(s): Limited activity within patient's tolerance     Hand Dominance Right   Extremity/Trunk Assessment Upper Extremity Assessment Upper Extremity Assessment: Generalized weakness   Lower Extremity  Assessment Lower Extremity Assessment: Defer to PT evaluation   Cervical / Trunk Assessment Cervical / Trunk Assessment: Kyphotic   Communication Communication Communication: HOH   Cognition Arousal/Alertness: Awake/alert Behavior During Therapy: WFL for tasks assessed/performed Overall Cognitive Status:  (most likely at baseline; tangential)                                       General Comments       Exercises     Shoulder Instructions      Home Living Family/patient expects to be discharged to:: Private residence Living Arrangements: Children Available Help at Discharge: Family Type of Home: House Home Access: Stairs to enter Technical brewer of Steps: 2 Entrance Stairs-Rails: Can reach both;Right;Left Home Layout: One level     Bathroom Shower/Tub: Teacher, early years/pre: Standard Bathroom Accessibility: No   Home Equipment: Conservation officer, nature (2 wheels);Cane - single point;Wheelchair - manual;BSC/3in1;Other (comment) (transport chair able to fit into bed room)          Prior Functioning/Environment Prior Level of Function : Needs assist       Physical Assist : Mobility (physical);ADLs (physical) uses her feet to propel her transport chair; daughter assits as needed; usually able to go up steps with assistance   ADLs (physical): Bathing;Dressing Mobility Comments: assist with ADL as needed; uses briefs; at baseline uses 3in1 by her bed and daughters assists with transfer as needed; does not use her bathroom          OT Problem List: Decreased strength;Decreased range of motion;Decreased activity tolerance;Impaired balance (sitting and/or standing);Decreased safety awareness;Decreased knowledge of use of DME or AE;Cardiopulmonary status limiting activity;Pain      OT Treatment/Interventions: Self-care/ADL training;Therapeutic exercise;Energy conservation;DME and/or AE instruction;Therapeutic activities;Cognitive  remediation/compensation;Patient/family education;Balance training    OT Goals(Current goals can be found in the care plan section) Acute Rehab OT Goals Patient Stated Goal: to return home with daughter OT Goal Formulation: With patient/family Time For Goal Achievement: 01/13/22 Potential to Achieve Goals: Good  OT Frequency: Min 2X/week    Co-evaluation PT/OT/SLP Co-Evaluation/Treatment: Yes Reason for Co-Treatment: For patient/therapist safety   OT goals addressed during session: ADL's and self-care;Strengthening/ROM      AM-PAC OT "6 Clicks" Daily Activity     Outcome Measure Help from another person eating meals?: A Little Help from another person taking care of personal grooming?: A Little Help from another person toileting, which includes using toliet, bedpan, or urinal?: A Lot Help from another person bathing (including washing, rinsing, drying)?: A Lot Help from another person to put on and taking off regular upper body clothing?: A Little Help from another person to put on and taking off regular lower body clothing?: A Lot 6 Click Score: 15   End of Session Equipment Utilized During Treatment: Rolling walker (2 wheels) Nurse Communication: Mobility status;Other (comment) (DC needs)  Activity Tolerance: Patient tolerated treatment well Patient left: in chair;with call bell/phone within reach;with chair alarm set;with family/visitor present  OT Visit Diagnosis: Unsteadiness on feet (R26.81);Other abnormalities of gait and mobility (R26.89);Muscle weakness (generalized) (M62.81);Other symptoms and signs involving cognitive function;Pain Pain - part of  body:  (generalized)                Time: 5391-2258 OT Time Calculation (min): 29 min Charges:  OT General Charges $OT Visit: 1 Visit OT Evaluation $OT Eval Moderate Complexity: [place], OT/L   Acute OT Clinical Specialist Acute Rehabilitation Services Pager [phone] Office [phone]    [facility] 12/30/2021, 11:21 AM

## 2021-12-30 NOTE — Progress Notes (Signed)
[facility]) Hospital Liaison Note  Notified by TOC manager of patient/family request for [facility] palliative services at home after discharge.   [facility] hospital liaison will follow patient for discharge disposition.   Please call with any hospice or outpatient palliative care related questions.   Thank you for the opportunity to participate in this patient's care.   Shanita Wicker, LCSW [facility] Hospital Liaison [phone]  

## 2021-12-30 NOTE — TOC Transition Note (Signed)
Transition of Care [facility]) - CM/SW Discharge Note   Patient Details  Name: [name] MRN: [id number] Date of Birth: [birth year]  [facility] of [facility]) CM/SW Contact:  Angelita Ingles, RN Phone Number:[phone]  12/30/2021, 3:48 PM   Clinical Narrative:    Transportation set up via [facility] . Discharge packet including DNR and most form is at the nurses station  in the chart. No other needs noted at this time. TOC will sign off     Barriers to Discharge: No Barriers Identified   Patient Goals and CMS Choice Patient states their goals for this hospitalization and ongoing recovery are:: Wants to go home CMS Medicare.gov Compare Post Acute Care list provided to:: Patient Choice offered to / list presented to : Patient, Adult Children (daughter at bedside)  Discharge Placement                       Discharge Plan and Services In-house Referral: NA Discharge Planning Services: CM Consult Post Acute Care Choice: Home Health          DME Arranged: N/A         HH Arranged: PT, OT   Date HH Agency Contacted: 12/30/21 Time [place]: 1202 Representative spoke with at [place]: [place] ([place]) Interventions     Readmission Risk Interventions    12/30/2021   11:52 AM  Readmission Risk Prevention Plan  Post Dischage Appt Complete  Medication Screening Complete  Transportation Screening Complete

## 2021-12-30 NOTE — TOC Initial Note (Addendum)
Transition of Care [facility]) - Initial/Assessment Note    Patient Details  Name: [name] MRN: [id number] Date of Birth: [birth year]  Transition of Care [facility]) CM/SW Contact:    Angelita Ingles, RN Phone Number:[phone]  12/30/2021, 12:06 PM  Clinical Narrative:                 [facility] consulted for patient with Home health needs. CM at bedside to offer choice per medicare.gov list. Patient and daughter states that patient has had home health on several occasions but they have no preference for the home health agency. [facility] referral has been sent to [facility]. Referral accepted by [facility]. Patient is requesting non emergent transport home due to difficulty with ambulation and inability to safely climb stairs to enter the home.   1400 patient and daughter agreeable to outpatient palliative to follow. No preference for referral other than they live here in [place]. Outpatient palliative referral has been called to [facility] with [facility].  No other needs noted at this time.    Expected Discharge Plan: [place] Barriers to Discharge: No Barriers Identified   Patient Goals and CMS Choice Patient states their goals for this hospitalization and ongoing recovery are:: Wants to go home CMS Medicare.gov Compare Post Acute Care list provided to:: Patient Choice offered to / list presented to : Patient, Adult Children (daughter at bedside)  Expected Discharge Plan and Services Expected Discharge Plan: [place] In-house Referral: NA Discharge Planning Services: CM Consult Post Acute Care Choice: [place] arrangements for the past 2 months: Single Family Home                 DME Arranged: N/A         HH Arranged: PT, OT   Date HH Agency Contacted: 12/30/21 Time HH Agency Contacted: 1202 Representative spoke with at [place]: Tommi Rumps  Prior Living Arrangements/Services Living arrangements for the past 2 months: [place] Lives  with:: Adult Children Patient language and need for interpreter reviewed:: Yes Do you feel safe going back to the place where you live?: Yes      Need for Family Participation in Patient Care: Yes (Comment) Care giver support system in place?: Yes (comment) Current home services:  (n/a) Criminal Activity/Legal Involvement Pertinent to Current Situation/Hospitalization: No - Comment as needed  Activities of Daily Living      Permission Sought/Granted Permission sought to share information with : Family Supports    Share Information with NAME: Keiry Kowal     Permission granted to share info w Relationship: daughter     Emotional Assessment Appearance:: Appears stated age Attitude/Demeanor/Rapport: Gracious Affect (typically observed): Pleasant, Quiet Orientation: : Oriented to Self, Oriented to Place, Oriented to  Time, Oriented to Situation Alcohol / Substance Use: Not Applicable Psych Involvement: No (comment)  Admission diagnosis:  Acute cholecystitis [K81.0] Nausea vomiting and diarrhea [R11.2, R19.7] Acute cholecystitis due to biliary calculus [K80.00] Acute biliary pancreatitis without infection or necrosis [K85.10] Patient Active Problem List   Diagnosis Date Noted   Acute cholecystitis 12/26/2021   Secondary hypercoagulable state ([place]) 02/04/2021   Unexplained weight loss 01/21/2021   Nausea 01/18/2021   Protein-calorie malnutrition, severe 01/16/2021   Persistent atrial fibrillation ([place]) 01/14/2021   Sick sinus syndrome ([place]) 01/14/2021   Syncope 01/14/2021   Anemia 01/14/2021   Weakness 01/14/2021   GI bleed 04/22/2020   Presbycusis of both ears 11/13/2019   Vertigo 03/27/2017  Arthritis 03/27/2017   Stroke [facility])    Status post transcatheter aortic valve replacement (TAVR) using bioprosthesis 01/03/2017   Chronic apical periodontitis 12/28/2016   Retained dental roots 12/28/2016   Bilateral maxillary lateral exostoses 98/42/1031   Diastolic  dysfunction    Chronic stable angina (HCC)    Tricuspid regurgitation    Pulmonary regurgitation    Left shoulder pain    Back pain-similar to pt's angina 12/01/2015   Carotid artery disease- s/p RCE 12/01/2015   Bowen's disease 03/16/2015   Dependent edema 10/30/2014   History of allergy to aspirin 06/02/2014   PAF (paroxysmal atrial fibrillation) (HCC)    Gastroesophageal reflux disease without esophagitis 12/05/2011   Hx of CABG 2006 with Maze 07/21/2011   Mitral regurgitation 07/21/2011   Allergic rhinitis 10/05/2010   Hyperlipidemia 28/03/8866   Uncomplicated asthma 73/73/6681   PCP:  Denita Lung, MD Pharmacy:   [facility] [address] [place] [address] Phone: [phone] Fax: [phone]  [facility] [place], [place] [address] [place] [address] Phone: [phone] Fax: [place] - [address] AT [address] [place] [address] Phone: [phone] Fax: [phone]     Social Determinants of Health (SDOH) Interventions    Readmission Risk Interventions    12/30/2021   11:52 AM  Readmission Risk Prevention Plan  Post Dischage Appt Complete  Medication Screening Complete  Transportation Screening Complete

## 2021-12-31 ENCOUNTER — Telehealth: Payer: Self-pay | Admitting: Hospice

## 2021-12-31 ENCOUNTER — Telehealth: Payer: Self-pay

## 2021-12-31 ENCOUNTER — Other Ambulatory Visit: Payer: Self-pay | Admitting: *Deleted

## 2021-12-31 NOTE — Telephone Encounter (Signed)
Transition Care Management Unsuccessful Follow-up Telephone Call  Date of discharge and from where:  12/30/21 [name]  Attempts:  1st Attempt  Reason for unsuccessful TCM follow-up call:  Left voice message

## 2021-12-31 NOTE — Discharge Summary (Signed)
Physician Discharge Summary   Patient: [name] MRN: [id number] DOB: Oct 09, 1928  Admit date:     12/25/2021  Discharge date: 12/30/2021  Discharge Physician: Berle Mull  PCP: Denita Lung, MD  Recommendations at discharge: Follow-up with PCP.  Follow-up with palliative care.  Discharge Diagnoses: Principal Problem:   Acute cholecystitis Active Problems:   Hyperlipidemia   Hx of CABG 2006 with Maze   Gastroesophageal reflux disease without esophagitis   Status post transcatheter aortic valve replacement (TAVR) using bioprosthesis   Hypokalemia   Persistent atrial fibrillation (HCC)   Sick sinus syndrome (HCC)   Protein-calorie malnutrition, severe   Elevated troponin   Hospital Course: PMH of CAD SP CABG, PAF SP maze procedure, CS SP CVA, GERD, moderate MR, severe AS SP TAVR, SSS SP PPM, HTN, HLD, fibromyalgia. Present to the hospital with numbers of abdominal pain.  Found to have acute pancreatitis secondary to gallstone along with Coley cystitis and choledocholithiasis. GI and general surgery were consulted. Underwent ERCP with stone removal. Patient currently too high risk for surgical intervention.  Cardiology was also consulted. managed conservatively.  Assessment and Plan: Acute cholecystitis Choledocholithiasis Gallstone pancreatitis Presents with complaints of abdominal pain. Found to have lipase level more than 3000. General surgery and GI consulted. CT scan shows evidence of biliary ductal dilation as well as choledocholithiasis. Underwent ERCP with removal of the stone. Per GI recommend to hold Eliquis for 3 to 5 days post ERCP. Patient was on IV Zosyn throughout the hospitalization. General surgery no indication for antibiotic. Even though the patient actually has severe protein calorie malnutrition recommend to avoid fat in diet therefore patient will be on heart healthy diet.   Elevated troponin hs troponin 19>23>54>53; plateaued.  Suspect  elevation likely secondary to type II demand ischemia in the setting of infection as above.  EKG with atrial fibrillation, rate 99.  On telemetry notably V paced. Cardiology consulted; TTE shows 40 to 45% EF with severe hypokinesis of left ventricular basal mid inferolateral wall severe MR. Palliative care consult.   Hypokalemia Resolved   CAD s/p CABG Appreciate cardiology consultation. high risk for the surgery   Paroxysmal atrial fibrillation s/p MAZE Follows with electrophysiology outpatient. Resume Eliquis.   CAS s/p CEA Resume statin   GERD Continue PPI.   Hx Severe AS s/p TAVR TTE 01/15/2021 with noted 23 mm SAPIEN prosthetic valve in the aortic position, unable to assess valve hemodynamically due to limited acoustic windows. --Outpatient follow-up with cardiology   Sick sinus syndrome s/p PPM --Continue monitor on telemetry   Chronic diastolic congestive heart failure Essential hypertension TTE 12/2020 with LVEF 55-60%, LV normal function, mild LVH, RV systolic function mildly reduced, mild/moderate MR, moderate TR, TAVR noted.  Currently not on antihypertensive therapy outpatient. --Continue monitor BP closely in the setting of infection as above   Hyperlipidemia --Holding statin   Asthma --Albuterol neb as needed   Severe protein calorie malnutrition Body mass index is 17.18 kg/m. Significant fat loss and muscle wasting noted on physical exam. Continue supplement on discharge.  Goals of care conversation. Palliative care was consulted. Goals of care was changed from full code to DNR. Referral to [facility] for palliative care services (not hospice) was placed.  Consultants: Cardiology, general surgery, palliative care, GI Procedures performed:  Echocardiogram  DISCHARGE MEDICATION: Allergies as of 12/30/2021       Reactions   Other Other (See Comments)   NO ACIDIC, TART< OR SPICY FOODS- DEVELOPS REFLUX OFTEN!! PATIENT HAS TROUBLE  SWALLOWING TABLETS!!    Bactrim [sulfamethoxazole-trimethoprim] Other (See Comments)   "makes her feel funny" or "unsteady"   Amitriptyline Hcl Rash   Aspirin Hives, Swelling, Rash, Other (See Comments)   Body became swollen   Tape Other (See Comments)   SKIN IS VERY THIN- WILL TEAR EASILY!!   Zetia [ezetimibe] Rash        Medication List     STOP taking these medications    fluticasone 50 MCG/ACT nasal spray Commonly known as: FLONASE   guaiFENesin 600 MG 12 hr tablet Commonly known as: MUCINEX       TAKE these medications    acetaminophen 650 MG CR tablet Commonly known as: TYLENOL Take 650-1,300 mg by mouth 2 (two) times daily as needed for pain.   apixaban 2.5 MG Tabs tablet Commonly known as: Eliquis Take 1 tablet (2.5 mg total) by mouth 2 (two) times daily. Start taking on: January 03, 2022 What changed:  how much to take how to take this when to take this additional instructions These instructions start on January 03, 2022. If you are unsure what to do until then, ask your doctor or other care provider.   BENEFIBER PO Take 1 Scoop by mouth daily as needed (fiber).   docusate sodium 100 MG capsule Commonly known as: COLACE Take 100 mg by mouth daily.   Ensure Take 237 mLs by mouth every other day.   EYE DROPS OP Apply 2 drops to eye daily as needed (gritty eyes).   fluticasone 220 MCG/ACT inhaler Commonly known as: FLOVENT HFA INHALE 2 PUFFS INTO THE LUNGS TWICE DAILY What changed: when to take this   Linzess 72 MCG capsule Generic drug: linaclotide Take 1 capsule (72 mcg total) by mouth daily.   loratadine 10 MG tablet Commonly known as: CLARITIN Take 1 tablet (10 mg total) by mouth daily.   nitroGLYCERIN 0.4 MG SL tablet Commonly known as: NITROSTAT DISSOLVE 1 TABLET UNDER THE TONGUE EVERY 5 MINUTES AS NEEDED FOR CHEST PAIN What changed:  how much to take how to take this when to take this reasons to take this additional instructions   OVER THE COUNTER  MEDICATION Take by mouth daily as needed (cough). Guaifenesin liquid PO 1 sip by mouth daily as needed for cough   oxyCODONE 5 MG immediate release tablet Commonly known as: Oxy IR/ROXICODONE Take 0.5 tablets (2.5 mg total) by mouth every 12 (twelve) hours as needed for moderate pain or severe pain.   pantoprazole 40 MG tablet Commonly known as: PROTONIX TAKE 1 TABLET BY MOUTH TWICE DAILY   polyethylene glycol powder 17 GM/SCOOP powder Commonly known as: MiraLax Take 17 g by mouth 2 (two) times daily as needed for moderate constipation.   rosuvastatin 20 MG tablet Commonly known as: CRESTOR Take 1 tablet (20 mg total) by mouth daily. What changed:  medication strength See the new instructions.   Ventolin HFA 108 (90 Base) MCG/ACT inhaler Generic drug: albuterol INHALE 2 PUFFS INTO THE LUNGS EVERY 6 HOURS AS NEEDED FOR WHEEZING OR SHORTNESS OF BREATH What changed:  how much to take how to take this when to take this reasons to take this additional instructions   Vitamin D-3 25 MCG (1000 UT) Caps Take 1,000 Units by mouth daily.        Follow-up Information     Denita Lung, MD. Schedule an appointment as soon as possible for a visit in 2 week(s).   Specialty: Family Medicine Contact information: [place]  [place] [address] [phone]         Burnell Blanks, MD .   Specialty: Cardiology Contact information: [place] [place] [address] [phone]         Care, [facility] Follow up.   Specialty: Home Health Services Why: Your home health has been set up [place]. The office will call you with start of service information. If you have any questions please call the number listed above. Contact information: [address] [place] [address] [phone]         [facility] Follow up.   Why: [facility] will follow for outpatient palliative care. The office will  call you with start of service information. If you have any questions or concerns please call the number listed above. Contact information: [place] [phone]               Disposition: Home Diet recommendation: Cardiac diet  Discharge Exam: Vitals:   12/30/21 0259 12/30/21 0500 12/30/21 0754 12/30/21 1127  BP: (!) 149/86  (!) 144/71 132/61  Pulse: 70  71 70  Resp: 18  (!) 22 17  Temp: 98.3 F (36.8 C)  98.6 F (37 C) 98.5 F (36.9 C)  TempSrc: Oral  Oral Oral  SpO2: 93%  92% 94%  Weight:  43.1 kg    Height:       General: Appear in mild distress; no visible Abnormal Neck Mass Or lumps, Conjunctiva normal Cardiovascular: S1 and S2 Present, aortic systolic  Murmur, Respiratory: good respiratory effort, Bilateral Air entry present and CTA, no Crackles, no wheezes Abdomen: Bowel Sound present, Non tender  Extremities: no Pedal edema Neurology: alert and oriented to time, place, and person Gait not checked due to patient safety concerns Filed Weights   12/28/21 1210 12/29/21 0500 12/30/21 0500  Weight: 42.6 kg 43.3 kg 43.1 kg   Condition at discharge: stable  The results of significant diagnostics from this hospitalization (including imaging, microbiology, ancillary and laboratory) are listed below for reference.   Imaging Studies: DG ERCP  Result Date: 12/28/2021 CLINICAL DATA:  72-year-old female with history of abdominal pain, choledocholithiasis. EXAM: ERCP TECHNIQUE: Multiple spot images obtained with the fluoroscopic device and submitted for interpretation post-procedure. FLUOROSCOPY TIME:  Fluoroscopy Time:  2 minute, 54 seconds Radiation Exposure Index (if provided by the fluoroscopic device): 23.3 Number of Acquired Spot Images: 5 COMPARISON:  12/26/2021 FINDINGS: Fluoroscopic images demonstrate retrograde cannulation of the common bile duct the endoscopic approach. Cholangiogram demonstrates moderate dilation of the intra  and extrahepatic bile ducts. Balloon sweep was performed. Final image demonstrates slight decompression common bile duct. IMPRESSION: ERCP with cholangiogram demonstrating moderate intra and extrahepatic biliary duct dilation. Balloon sweep was performed. These images were submitted for radiologic interpretation only. Please see the procedural report for the full procedural details, amount of contrast, and the fluoroscopy time utilized. Electronically Signed   By: Ruthann Cancer M.D.   On: 12/28/2021 13:51   ECHOCARDIOGRAM COMPLETE  Result Date: 12/28/2021    ECHOCARDIOGRAM REPORT   Patient Name:   LORIBETH KATICH Date of Exam: 12/28/2021 Medical Rec #:  [id number]     Height:       62.0 in Accession #:    [id number]    Weight:       93.9 lb Date of Birth:  1928-08-03    BSA:          1.386 m Patient  Age:    88 years      BP:           114/55 mmHg Patient Gender: F             HR:           70 bpm. Exam Location:  Inpatient Procedure: 2D Echo, Cardiac Doppler and Color Doppler Indications:    Z01.818 Encounter for other preprocedural examination  History:        Patient has prior history of Echocardiogram examinations, most                 recent 01/15/2021. Abnormal ECG and Pacemaker, Stroke, Aortic                 Valve Disease and Mitral Valve Disease, Arrythmias:Atrial                 Fibrillation and LBBB; Signs/Symptoms:Syncope. Aortic stenosis.                 Aortic Valve: 23 mm Sapien prosthetic, stented (TAVR) valve is                 present in the aortic position. Procedure Date: 01/03/2017.  Sonographer:    Roseanna Rainbow RDCS Referring Phys: Freada Bergeron  Sonographer Comments: Technically difficult study due to poor echo windows and suboptimal apical window. Difficult study due to extremely thin habitus. Attempted to turn. IMPRESSIONS  1. Left ventricular ejection fraction, by estimation, is 40 to 45%. The left ventricle has mildly decreased function. The left ventricle demonstrates regional wall motion  abnormalities (see scoring diagram/findings for description). Left ventricular diastolic function could not be evaluated. There is severe hypokinesis of the left ventricular, basal-mid inferolateral wall.  2. Right ventricular systolic function is normal. The right ventricular size is normal. There is moderately elevated pulmonary artery systolic pressure. The estimated right ventricular systolic pressure is 67.3 mmHg.  3. Left atrial size was mildly dilated.  4. Right atrial size was mildly dilated.  5. Multiple jets of mitral insufficiency are seen. The dominant jet is eccentric and posteriorly directed. A centrally directed jet is also seen. The cumulative degree of regurgitation is probably severe. The mitral valve is myxomatous. Severe mitral valve regurgitation. There is holosystolic prolapse of both leaflets of the mitral valve. The mean mitral valve gradient is 4.0 mmHg.  6. The tricuspid valve is myxomatous. Tricuspid valve regurgitation is moderate.  7. The aortic valve has been repaired/replaced. Aortic valve regurgitation is trivial. There is a 23 mm Sapien prosthetic (TAVR) valve present in the aortic position. Procedure Date: 01/03/2017. Aortic valve mean gradient measures 7.7 mmHg. Aortic valve Vmax measures 1.96 m/s. Aortic valve acceleration time measures 50 msec.  8. Pulmonic valve regurgitation is moderate.  9. The inferior vena cava is dilated in size with <50% respiratory variability, suggesting right atrial pressure of 15 mmHg. Comparison(s): The left ventricular function is worsened. The left ventricular wall motion abnormality is new. Mitral regurgitation is worse (may have been previously underestimated). FINDINGS  Left Ventricle: Left ventricular ejection fraction, by estimation, is 40 to 45%. The left ventricle has mildly decreased function. The left ventricle demonstrates regional wall motion abnormalities. Severe hypokinesis of the left ventricular, basal-mid inferolateral wall. The left  ventricular internal cavity size was normal in size. There is no left ventricular hypertrophy. Abnormal (paradoxical) septal motion, consistent with RV pacemaker. Left ventricular diastolic function could not be evaluated due  to atrial fibrillation. Left ventricular  diastolic function could not be evaluated.  LV Wall Scoring: The posterior wall is hypokinetic. Right Ventricle: The right ventricular size is normal. No increase in right ventricular wall thickness. Right ventricular systolic function is normal. There is moderately elevated pulmonary artery systolic pressure. The tricuspid regurgitant velocity is 3.11 m/s, and with an assumed right atrial pressure of 15 mmHg, the estimated right ventricular systolic pressure is 53.6 mmHg. Left Atrium: Left atrial size was mildly dilated. Right Atrium: Right atrial size was mildly dilated. Pericardium: There is no evidence of pericardial effusion. Mitral Valve: Multiple jets of mitral insufficiency are seen. The dominant jet is eccentric and posteriorly directed. A centrally directed jet is also seen. The cumulative degree of regurgitation is probably severe. The mitral valve is myxomatous. There is holosystolic prolapse of both leaflets of the mitral valve. There is moderate thickening of the mitral valve leaflet(s). There is mild calcification of the mitral valve leaflet(s). Severe mitral valve regurgitation. MV peak gradient, 13.2 mmHg. The mean mitral valve gradient is 4.0 mmHg with average heart rate of 90 bpm. Tricuspid Valve: The tricuspid valve is myxomatous. Tricuspid valve regurgitation is moderate. Aortic Valve: The aortic valve has been repaired/replaced. Aortic valve regurgitation is trivial. Aortic valve mean gradient measures 7.7 mmHg. Aortic valve peak gradient measures 15.3 mmHg. Aortic valve area, by VTI measures 3.54 cm. There is a 23 mm Sapien prosthetic, stented (TAVR) valve present in the aortic position. Procedure Date: 01/03/2017. Pulmonic Valve:  The pulmonic valve was grossly normal. Pulmonic valve regurgitation is moderate. Aorta: The aortic root and ascending aorta are structurally normal, with no evidence of dilitation. Venous: The inferior vena cava is dilated in size with less than 50% respiratory variability, suggesting right atrial pressure of 15 mmHg. IAS/Shunts: No atrial level shunt detected by color flow Doppler. Additional Comments: A device lead is visualized in the right atrium and right ventricle.  LEFT VENTRICLE PLAX 2D LVIDd:         3.30 cm     Diastology LVIDs:         2.70 cm     LV e' medial:    3.23 cm/s LV PW:         0.90 cm     LV E/e' medial:  54.5 LV IVS:        1.00 cm     LV e' lateral:   3.32 cm/s LVOT diam:     2.30 cm     LV E/e' lateral: 53.0 LV SV:         132 LV SV Index:   95 LVOT Area:     4.15 cm  LV Volumes (MOD) LV vol d, MOD A2C: 55.5 ml LV vol d, MOD A4C: 44.7 ml LV vol s, MOD A2C: 24.1 ml LV vol s, MOD A4C: 21.3 ml LV SV MOD A2C:     31.4 ml LV SV MOD A4C:     44.7 ml LV SV MOD BP:      28.0 ml IVC IVC diam: 2.00 cm LEFT ATRIUM             Index        RIGHT ATRIUM           Index LA Vol (A2C):   26.4 ml 19.04 ml/m  RA Area:     10.30 cm LA Vol (A4C):   32.2 ml 23.23 ml/m  RA Volume:   23.90 ml  17.24 ml/m LA Biplane Vol: 31.1 ml 22.43 ml/m  AORTIC VALVE                     PULMONIC VALVE AV Area (Vmax):    3.50 cm      PR End Diast Vel: 2.37 msec AV Area (Vmean):   3.31 cm AV Area (VTI):     3.54 cm AV Vmax:           195.67 cm/s AV Vmean:          128.000 cm/s AV VTI:            0.372 m AV Peak Grad:      15.3 mmHg AV Mean Grad:      7.7 mmHg LVOT Vmax:         165.00 cm/s LVOT Vmean:        102.000 cm/s LVOT VTI:          0.317 m LVOT/AV VTI ratio: 0.85  AORTA Ao Root diam: 3.20 cm Ao Asc diam:  2.70 cm MITRAL VALVE                  TRICUSPID VALVE MV Area (PHT): 3.21 cm       TR Peak grad:   38.7 mmHg MV Area VTI:   3.47 cm       TR Vmax:        311.00 cm/s MV Peak grad:  13.2 mmHg MV Mean grad:  4.0  mmHg       SHUNTS MV Vmax:       1.82 m/s       Systemic VTI:  0.32 m MV Vmean:      88.5 cm/s      Systemic Diam: 2.30 cm MV Decel Time: 236 msec MR Peak grad:    134.1 mmHg MR Mean grad:    91.0 mmHg MR Vmax:         579.00 cm/s MR Vmean:        449.0 cm/s MR PISA:         1.01 cm MR PISA Eff ROA: 7 mm MR PISA Radius:  0.40 cm MV E velocity: 176.00 cm/s Dani Gobble Croitoru MD Electronically signed by Sanda Klein MD Signature Date/Time: 12/28/2021/11:53:05 AM    Final    US Abdomen Limited RUQ (LIVER/GB)  Result Date: 12/26/2021 CLINICAL DATA:  Epigastric abdominal pain. EXAM: ULTRASOUND ABDOMEN LIMITED RIGHT UPPER QUADRANT COMPARISON:  Abdominopelvic CT earlier today demonstrating choledocholithiasis. FINDINGS: Gallbladder: The gallbladder is prominently distended. The gallstones on prior CT are not well demonstrated on the current exam. Gallbladder wall thickening at 5 mm. Positive sonographic Murphy sign noted by sonographer. Common bile duct: Diameter: 13 mm. The common bile duct stones on CT are not well seen on the current exam. Liver: Mild central intrahepatic biliary ductal dilatation. No focal liver lesion. Portal vein is patent on color Doppler imaging with normal direction of blood flow towards the liver. Other: Small amount of right upper quadrant free fluid. IMPRESSION: 1. Distended gallbladder with wall thickening and positive sonographic Murphy sign. The gallstones on CT earlier today are not well seen on the current exam. 2. Biliary dilatation, choledocholithiasis on CT not well demonstrated on the current exam. Electronically Signed   By: Keith Rake M.D.   On: 12/26/2021 02:18   CT ABDOMEN PELVIS W CONTRAST  Result Date: 12/26/2021 CLINICAL DATA:  Abdominal pain and vomiting. EXAM: CT ABDOMEN AND PELVIS WITH CONTRAST TECHNIQUE: Multidetector CT imaging of the abdomen and pelvis was performed using  the standard protocol following bolus administration of intravenous contrast. RADIATION DOSE  REDUCTION: This exam was performed according to the departmental dose-optimization program which includes automated exposure control, adjustment of the mA and/or kV according to patient size and/or use of iterative reconstruction technique. CONTRAST:  174m OMNIPAQUE IOHEXOL 300 MG/ML  SOLN COMPARISON:  01/16/2021. FINDINGS: Lower chest: The heart is enlarged and pacemaker leads are noted in the heart. There are small bilateral pleural effusions. Hepatobiliary: No focal liver abnormality. Intrahepatic and extrahepatic biliary ductal dilatation is noted. The common bile duct measures 1 cm. Multiple stones are present in the distal common bile duct measuring up to 6 mm. Stones are present in the gallbladder. Pancreas: Unremarkable. No pancreatic ductal dilatation or surrounding inflammatory changes. Spleen: The spleen is normal in size. Scattered hypodensities are noted, possibly representing cysts or hemangiomas. Adrenals/Urinary Tract: No adrenal nodule or mass. The kidneys enhance symmetrically. Renal cortical scarring is noted bilaterally. A stable exophytic cyst is noted in the upper pole the right kidney. No renal calculus or hydronephrosis. The bladder is unremarkable. Stomach/Bowel: No bowel obstruction, free air, or pneumatosis. Gastric wall thickening is noted. Scattered diverticula are present along the colon without evidence of diverticulitis. The appendix is unremarkable. Mild bowel wall thickening is noted in the jejunum in the pelvis. Vascular/Lymphatic: Aortic atherosclerosis. No enlarged abdominal or pelvic lymph nodes. Reproductive: Status post hysterectomy. No adnexal masses. Other: Mesenteric edema is noted with free fluid at the gallbladder fossa, perihepatic space, and pelvis. Musculoskeletal: Sternotomy wires are present over the midline. Degenerative changes are noted in the thoracolumbar spine. No acute osseous abnormality. IMPRESSION: 1. Increased intrahepatic and extrahepatic biliary ductal  dilatation. There is increased distention of the common bile duct measuring 1 cm with choledocholithiasis. Cholelithiasis is also noted. Free fluid is noted at the gallbladder fossa and perihepatic space posteriorly. Correlate clinically to exclude superimposed acute cholecystitis. 2. Jejunal bowel wall thickening in the pelvis, possible infectious or inflammatory enteritis. 3. Diffuse mesenteric edema and a small amount of free fluid in the pelvis. 4. Small bilateral pleural effusions. 5. Gastric wall thickening, possible gastritis. 6. Aortic atherosclerosis. 7. Remaining ancillary findings as detailed above. Electronically Signed   By: LBrett FairyM.D.   On: 12/26/2021 00:54   DG Chest Port 1 View  Result Date: 12/25/2021 CLINICAL DATA:  Shortness of breath. EXAM: PORTABLE CHEST 1 VIEW COMPARISON:  01/16/2021. FINDINGS: The heart size and mediastinal contours are stable. There is atherosclerotic calcification of the aorta. A dual lead pacemaker is present over the left chest. There is blunting of the right costophrenic angle which is unchanged from the prior exam and may represent atelectasis or scarring. The left lung is clear. No pneumothorax. There is evidence of prior cardiothoracic surgery with TAVR stent. IMPRESSION: 1. Blunting of the right costophrenic angle, possibly representing atelectasis or scarring is compared with previous exams. 2. Otherwise stable chest. Electronically Signed   By: LBrett FairyM.D.   On: 12/25/2021 23:40    Microbiology: Results for orders placed or performed during the hospital encounter of 01/14/21  Resp Panel by RT-PCR (Flu A&B, Covid) Nasopharyngeal Swab     Status: None   Collection Time: 01/14/21  5:03 PM   Specimen: Nasopharyngeal Swab; Nasopharyngeal(NP) swabs in vial transport medium  Result Value Ref Range Status   SARS Coronavirus 2 by RT PCR NEGATIVE NEGATIVE Final    Comment: (NOTE) SARS-CoV-2 target nucleic acids are NOT DETECTED.  The SARS-CoV-2  RNA is generally detectable in upper  respiratory specimens during the acute phase of infection. The lowest concentration of SARS-CoV-2 viral copies this assay can detect is 138 copies/mL. A negative result does not preclude SARS-Cov-2 infection and should not be used as the sole basis for treatment or other patient management decisions. A negative result may occur with  improper specimen collection/handling, submission of specimen other than nasopharyngeal swab, presence of viral mutation(s) within the areas targeted by this assay, and inadequate number of viral copies(<138 copies/mL). A negative result must be combined with clinical observations, patient history, and epidemiological information. The expected result is Negative.  Fact Sheet for Patients:  EntrepreneurPulse.com.au  Fact Sheet for Healthcare Providers:  IncredibleEmployment.be  This test is no t yet approved or cleared by the [place] FDA and  has been authorized for detection and/or diagnosis of SARS-CoV-2 by FDA under an Emergency Use Authorization (EUA). This EUA will remain  in effect (meaning this test can be used) for the duration of the COVID-19 declaration under Section 564(b)(1) of the Act, 21 U.S.C.section 360bbb-3(b)(1), unless the authorization is terminated  or revoked sooner.       Influenza A by PCR NEGATIVE NEGATIVE Final   Influenza B by PCR NEGATIVE NEGATIVE Final    Comment: (NOTE) The Xpert Xpress SARS-CoV-2/FLU/RSV plus assay is intended as an aid in the diagnosis of influenza from Nasopharyngeal swab specimens and should not be used as a sole basis for treatment. Nasal washings and aspirates are unacceptable for Xpert Xpress SARS-CoV-2/FLU/RSV testing.  Fact Sheet for Patients: EntrepreneurPulse.com.au  Fact Sheet for Healthcare Providers: IncredibleEmployment.be  This test is not yet approved or cleared by the  [place] FDA and has been authorized for detection and/or diagnosis of SARS-CoV-2 by FDA under an Emergency Use Authorization (EUA). This EUA will remain in effect (meaning this test can be used) for the duration of the COVID-19 declaration under Section 564(b)(1) of the Act, 21 U.S.C. section 360bbb-3(b)(1), unless the authorization is terminated or revoked.  Performed at [facility], [place] [address], [place], [place] [address]   SARS CORONAVIRUS 2 (TAT 6-24 HRS) Nasopharyngeal Nasopharyngeal Swab     Status: None   Collection Time: 01/14/21  6:53 PM   Specimen: Nasopharyngeal Swab  Result Value Ref Range Status   SARS Coronavirus 2 NEGATIVE NEGATIVE Final    Comment: (NOTE) SARS-CoV-2 target nucleic acids are NOT DETECTED.  The SARS-CoV-2 RNA is generally detectable in upper and lower respiratory specimens during the acute phase of infection. Negative results do not preclude SARS-CoV-2 infection, do not rule out co-infections with other pathogens, and should not be used as the sole basis for treatment or other patient management decisions. Negative results must be combined with clinical observations, patient history, and epidemiological information. The expected result is Negative.  Fact Sheet for Patients: SugarRoll.be  Fact Sheet for Healthcare Providers: https://www.woods-mathews.com/  This test is not yet approved or cleared by the [place] FDA and  has been authorized for detection and/or diagnosis of SARS-CoV-2 by FDA under an Emergency Use Authorization (EUA). This EUA will remain  in effect (meaning this test can be used) for the duration of the COVID-19 declaration under Se ction 564(b)(1) of the Act, 21 U.S.C. section 360bbb-3(b)(1), unless the authorization is terminated or revoked sooner.  Performed at [facility], [place] [address], [place], [place] [address]   MRSA Next Gen by PCR, Nasal      Status: None   Collection Time: 01/14/21  8:30 PM  Specimen: Nasal Mucosa; Nasal Swab  Result Value Ref Range Status   MRSA by PCR Next Gen NOT DETECTED NOT DETECTED Final    Comment: (NOTE) The GeneXpert MRSA Assay (FDA approved for NASAL specimens only), is one component of a comprehensive MRSA colonization surveillance program. It is not intended to diagnose MRSA infection nor to guide or monitor treatment for MRSA infections. Test performance is not FDA approved in patients less than 18 years old. Performed at [facility], [place] [address] [place], [place] [address]   Surgical PCR screen     Status: None   Collection Time: 01/15/21 11:45 AM   Specimen: Nasal Mucosa; Nasal Swab  Result Value Ref Range Status   MRSA, PCR NEGATIVE NEGATIVE Final   Staphylococcus aureus NEGATIVE NEGATIVE Final    Comment: (NOTE) The Xpert SA Assay (FDA approved for NASAL specimens in patients 72 years of age and older), is one component of a comprehensive surveillance program. It is not intended to diagnose infection nor to guide or monitor treatment. Performed at [facility], [place] [address], [place], [place] [address]   SARS CORONAVIRUS 2 (TAT 6-24 HRS) Nasopharyngeal Nasopharyngeal Swab     Status: None   Collection Time: 01/18/21  4:07 PM   Specimen: Nasopharyngeal Swab  Result Value Ref Range Status   SARS Coronavirus 2 NEGATIVE NEGATIVE Final    Comment: (NOTE) SARS-CoV-2 target nucleic acids are NOT DETECTED.  The SARS-CoV-2 RNA is generally detectable in upper and lower respiratory specimens during the acute phase of infection. Negative results do not preclude SARS-CoV-2 infection, do not rule out co-infections with other pathogens, and should not be used as the sole basis for treatment or other patient management decisions. Negative results must be combined with clinical observations, patient history, and epidemiological information. The expected result is  Negative.  Fact Sheet for Patients: SugarRoll.be  Fact Sheet for Healthcare Providers: https://www.woods-mathews.com/  This test is not yet approved or cleared by the [place] FDA and  has been authorized for detection and/or diagnosis of SARS-CoV-2 by FDA under an Emergency Use Authorization (EUA). This EUA will remain  in effect (meaning this test can be used) for the duration of the COVID-19 declaration under Se ction 564(b)(1) of the Act, 21 U.S.C. section 360bbb-3(b)(1), unless the authorization is terminated or revoked sooner.  Performed at [facility], [place] [address], [place], [place] [address]     Labs: CBC: Recent Labs  Lab 12/25/21 2202 12/25/21 2214 12/26/21 1497 12/27/21 0221 12/28/21 0307 12/29/21 0238 12/30/21 0254  WBC 12.3*  --  13.4* 7.6 8.6 6.5 6.6  NEUTROABS 11.2*  --   --   --   --   --   --   HGB 12.7   < > 12.2 11.2* 11.9* 11.7* 12.6  HCT 39.2   < > 37.8 33.4* 36.7 34.3* 38.2  MCV 94.0  --  92.6 92.5 92.4 90.3 92.3  PLT 152  --  150 109* 132* 142* 150   < > = values in this interval not displayed.   Basic Metabolic Panel: Recent Labs  Lab 12/26/21 0652 12/27/21 0221 12/28/21 0307 12/29/21 0238 12/30/21 0254  NA 138 138 140 139 140  K 3.5 3.1* 3.6 3.8 3.6  CL 108 107 107 106 107  CO2 23 22 18* 24 28  GLUCOSE 110* 84 50* 236* 112*  BUN '10 10 11 '$ 7* 7*  CREATININE 0.51 0.51 0.66 0.49 0.44  CALCIUM 8.6*  8.7* 9.0 8.5* 8.9  MG  --   --  1.8  --   --    Liver Function Tests: Recent Labs  Lab 12/26/21 0652 12/27/21 0221 12/28/21 0307 12/29/21 0238 12/30/21 0254  AST 63* '28 21 15 17  '$ ALT 37 '25 19 16 17  '$ ALKPHOS 37* 33* 32* 30* 32*  BILITOT 0.9 1.1 1.4* 0.4 0.3  PROT 5.7* 5.2* 5.3* 5.2* 5.4*  ALBUMIN 3.4* 2.9* 2.9* 2.6* 2.7*   CBG: Recent Labs  Lab 12/28/21 0832 12/28/21 1225 12/28/21 1429 12/29/21 0752 12/30/21 0759  GLUCAP 147* 85 82 153* 96    Discharge time spent:  greater than 30 minutes.  Signed: Berle Mull, MD [facility] 12/30/2021

## 2021-12-31 NOTE — Patient Outreach (Signed)
  Care Coordination [facility] Note Transition Care Management Follow-up Telephone Call Date of discharge and from where: [facility] [phone] How have you been since you were released from the hospital? Not feeling so well Any questions or concerns? No  Items Reviewed: Did the pt receive and understand the discharge instructions provided? Yes  Medications obtained and verified? Yes  Other? No  Any new allergies since your discharge? No  Dietary orders reviewed? No Do you have support at home? Yes   Home Care and Equipment/Supplies: Were home health services ordered? yes If so, what is the name of the agency? [facility] Has the agency set up a time to come to the patient's home? no Were any new equipment or medical supplies ordered?  No What is the name of the medical supply agency? N  Were you able to get the supplies/equipment? not applicable Do you have any questions related to the use of the equipment or supplies? No  Functional Questionnaire: (I = Independent and D = Dependent) ADLs: d  Bathing/Dressing- d  Meal Prep- d  Eating- I  Maintaining continence- D  Transferring/Ambulation- I  Managing Meds- D  Follow up appointments reviewed:  PCP Hospital f/u appt confirmed? No  Patient daughter refused assistance with scheduling appt. She wanted to do herself [facility] f/u appt confirmed? No   Are transportation arrangements needed? No  If their condition worsens, is the pt aware to call PCP or go to the Emergency Dept.? Yes Was the patient provided with contact information for the PCP's office or ED? Yes Was to pt encouraged to call back with questions or concerns? Yes  SDOH assessments and interventions completed:   Yes  Care Coordination Interventions Activated:  N   Care Coordination Interventions:   N     Encounter Outcome:  Pt. Visit Completed    [facility] [phone]

## 2021-12-31 NOTE — Telephone Encounter (Signed)
Attempted to contact patient's daughter [name] to schedule Palliative Consult, left VM requesting a return call to schedule.

## 2022-01-03 DIAGNOSIS — R7989 Other specified abnormal findings of blood chemistry: Secondary | ICD-10-CM | POA: Diagnosis present

## 2022-01-03 DIAGNOSIS — R778 Other specified abnormalities of plasma proteins: Secondary | ICD-10-CM | POA: Diagnosis present

## 2022-01-04 ENCOUNTER — Telehealth: Payer: Self-pay

## 2022-01-04 NOTE — Telephone Encounter (Signed)
Attempted to contact patient's daughter [name] to schedule a Palliative Care consult appointment. No answer left a voicemail to return call.

## 2022-01-05 ENCOUNTER — Telehealth: Payer: Self-pay | Admitting: Family Medicine

## 2022-01-05 ENCOUNTER — Telehealth: Payer: Self-pay

## 2022-01-05 DIAGNOSIS — K219 Gastro-esophageal reflux disease without esophagitis: Secondary | ICD-10-CM | POA: Diagnosis not present

## 2022-01-05 DIAGNOSIS — Z7901 Long term (current) use of anticoagulants: Secondary | ICD-10-CM | POA: Diagnosis not present

## 2022-01-05 DIAGNOSIS — I252 Old myocardial infarction: Secondary | ICD-10-CM | POA: Diagnosis not present

## 2022-01-05 DIAGNOSIS — I4819 Other persistent atrial fibrillation: Secondary | ICD-10-CM | POA: Diagnosis not present

## 2022-01-05 DIAGNOSIS — I088 Other rheumatic multiple valve diseases: Secondary | ICD-10-CM | POA: Diagnosis not present

## 2022-01-05 DIAGNOSIS — I700 Atherosclerosis of aorta: Secondary | ICD-10-CM | POA: Diagnosis not present

## 2022-01-05 DIAGNOSIS — Z951 Presence of aortocoronary bypass graft: Secondary | ICD-10-CM | POA: Diagnosis not present

## 2022-01-05 DIAGNOSIS — Z950 Presence of cardiac pacemaker: Secondary | ICD-10-CM | POA: Diagnosis not present

## 2022-01-05 DIAGNOSIS — M797 Fibromyalgia: Secondary | ICD-10-CM | POA: Diagnosis not present

## 2022-01-05 DIAGNOSIS — R29898 Other symptoms and signs involving the musculoskeletal system: Secondary | ICD-10-CM | POA: Diagnosis not present

## 2022-01-05 DIAGNOSIS — M419 Scoliosis, unspecified: Secondary | ICD-10-CM | POA: Diagnosis not present

## 2022-01-05 DIAGNOSIS — I69398 Other sequelae of cerebral infarction: Secondary | ICD-10-CM | POA: Diagnosis not present

## 2022-01-05 DIAGNOSIS — I495 Sick sinus syndrome: Secondary | ICD-10-CM | POA: Diagnosis not present

## 2022-01-05 DIAGNOSIS — K8062 Calculus of gallbladder and bile duct with acute cholecystitis without obstruction: Secondary | ICD-10-CM | POA: Diagnosis not present

## 2022-01-05 DIAGNOSIS — I25118 Atherosclerotic heart disease of native coronary artery with other forms of angina pectoris: Secondary | ICD-10-CM | POA: Diagnosis not present

## 2022-01-05 DIAGNOSIS — E785 Hyperlipidemia, unspecified: Secondary | ICD-10-CM | POA: Diagnosis not present

## 2022-01-05 DIAGNOSIS — K851 Biliary acute pancreatitis without necrosis or infection: Secondary | ICD-10-CM | POA: Diagnosis not present

## 2022-01-05 DIAGNOSIS — I110 Hypertensive heart disease with heart failure: Secondary | ICD-10-CM | POA: Diagnosis not present

## 2022-01-05 DIAGNOSIS — J45909 Unspecified asthma, uncomplicated: Secondary | ICD-10-CM | POA: Diagnosis not present

## 2022-01-05 DIAGNOSIS — E43 Unspecified severe protein-calorie malnutrition: Secondary | ICD-10-CM | POA: Diagnosis not present

## 2022-01-05 DIAGNOSIS — I5032 Chronic diastolic (congestive) heart failure: Secondary | ICD-10-CM | POA: Diagnosis not present

## 2022-01-05 DIAGNOSIS — M1990 Unspecified osteoarthritis, unspecified site: Secondary | ICD-10-CM | POA: Diagnosis not present

## 2022-01-05 DIAGNOSIS — Z7951 Long term (current) use of inhaled steroids: Secondary | ICD-10-CM | POA: Diagnosis not present

## 2022-01-05 DIAGNOSIS — Z952 Presence of prosthetic heart valve: Secondary | ICD-10-CM | POA: Diagnosis not present

## 2022-01-05 DIAGNOSIS — F419 Anxiety disorder, unspecified: Secondary | ICD-10-CM | POA: Diagnosis not present

## 2022-01-05 NOTE — Telephone Encounter (Signed)
Spoke with patient's daughter [name] and scheduled a telephonic Palliative Consult for 01/10/22 @ 3 PM per request.  Consent obtained; updated Netsmart, Team List and Epic.

## 2022-01-05 NOTE — Telephone Encounter (Signed)
Attempted to contact patient's daughter [name] to schedule a Palliative Care consult appointment. No answer left a message to return call.

## 2022-01-05 NOTE — Telephone Encounter (Signed)
Received a call from [place] with [facility]. He saw pt today for PT initial assessment. He is requesting PT 1 time a week for 8 weeks. Also states that pt would benefit from OT and home health nursing. States that pt does have some swelling in both feet and ankles. Clair Gulling can be reached at [phone].

## 2022-01-07 ENCOUNTER — Telehealth: Payer: Self-pay

## 2022-01-07 NOTE — Telephone Encounter (Signed)
OT with [facility] home health called wanting to let you know they can not start OT until next week per pts. Daughter they did not want to start this week. [phone].

## 2022-01-10 ENCOUNTER — Other Ambulatory Visit: Payer: Medicare Other | Admitting: Family Medicine

## 2022-01-10 DIAGNOSIS — K8042 Calculus of bile duct with acute cholecystitis without obstruction: Secondary | ICD-10-CM

## 2022-01-10 DIAGNOSIS — E43 Unspecified severe protein-calorie malnutrition: Secondary | ICD-10-CM | POA: Diagnosis not present

## 2022-01-10 NOTE — Progress Notes (Signed)
[facility] Consult Note Telephone: [phone]  Fax: [phone]   Date of encounter: 01/10/22 3:02 PM PATIENT NAME: [name] [address] [place] [address]   [phone] (home)  DOB: 30-Nov-1928 MRN: [id number] PRIMARY CARE PROVIDER:    Denita Lung, MD,  [place] [address] [phone]  REFERRING PROVIDER:   Denita Lung, [place],  [place] [address] [phone]  RESPONSIBLE PARTY:    Contact Information     Name Relation Home Work [place] Daughter [phone]  [phone]     I connected with  [name] and her daughter [name] on 01/26/22 by a video enabled telemedicine application and verified that I am speaking with the correct person using two identifiers.   I discussed the limitations of evaluation and management by telemedicine. The patient expressed understanding and agreed to proceed.    Palliative Care was asked to follow this patient by consultation request of  Denita Lung, MD to address advance care planning and complex medical decision making. This is the initial visit.          ASSESSMENT, SYMPTOM MANAGEMENT AND PLAN / RECOMMENDATIONS:   Choledocholithiasis with acute pancreatitis Stable, asymptomatic with improved lipase following sphincterotomy with ERCP Encourage pt to eat low fat diet Call for recurrent pain, nausea, vomiting or fever.   Protein Calorie Malnutrition Continue to try to get in 3 Ensures daily. Provide meds with applesauce or pudding to increase caloric intake Small frequent meals every 2-3 hours while awake to improve intake and tolerability  Follow up Palliative Care Visit: Palliative care will continue to follow for complex medical decision making, advance care planning, and clarification of goals. Return 4 weeks or prn.    This visit was coded based on medical decision making  (MDM).  PPS: 40%  HOSPICE ELIGIBILITY/DIAGNOSIS: TBD  Chief Complaint:  [name] received a referral to follow up with patient for chronic disease management in setting of chronic valvular heart disease and atrial fibrillation.   HISTORY OF PRESENT ILLNESS:  [name] is a 88 y.o. year old female with recent acute gallstone pancreatitis.  She has MVP and all 4 heart valvular disease, s/p TAVR . She has mod pulmonary hypertension, reduced EF, atrial fibrillation, hx of stroke, asthma, GERD, chronic apical periodontitis, Bowen's disease, HLD, OA,  ASA allergy, pacemaker, vertigo, protein calorie malnutrition, weakness and syncope.  During most recent admission her lipase was elevated at 3580. She does have some heart disease and heart failure with 2 pillow orthopnea and has to have cold air blowing to breathe well. She had echo 01/15/21 with EF 50-55% and no wall motion abnormality.  She has air hunger but sats are good.  2d echo 12/28/21:  EF 40-45%, wall motion abnormality, severe mitral insufficiency and MVP of 2 leaflets. severe hypokinesis of the left ventricular, basal-mid inferolateral wall.  She has RVSP elevated at 53.7 mm HG.  They are recommending 2-3 Ensures daily and usually has 1 daily.  No abd pain, nausea/vomiting.  Takes Linzess when not going anywhere and takes Colace.  Eating better low fat diet.  No falls. Has trouble with getting WC out the door.  Has help with bathing (pan bathing).    History obtained from review of EMR, discussion with family and/or [name].   12/26/21 CT abdomen and pelvis with contrast: FINDINGS: Lower chest: The heart is enlarged and pacemaker leads  are noted in the heart. There are small bilateral pleural effusions.   Hepatobiliary: No focal liver abnormality. Intrahepatic and extrahepatic biliary ductal dilatation is noted. The common bile duct measures 1 cm. Multiple stones are present in the distal common bile duct  measuring up to 6 mm. Stones are present in the gallbladder.   Pancreas: Unremarkable. No pancreatic ductal dilatation or surrounding inflammatory changes.   Spleen: The spleen is normal in size. Scattered hypodensities are noted, possibly representing cysts or hemangiomas.   Adrenals/Urinary Tract: No adrenal nodule or mass. The kidneys enhance symmetrically. Renal cortical scarring is noted bilaterally. A stable exophytic cyst is noted in the upper pole the right kidney. No renal calculus or hydronephrosis. The bladder is unremarkable.   Stomach/Bowel: No bowel obstruction, free air, or pneumatosis. Gastric wall thickening is noted. Scattered diverticula are present along the colon without evidence of diverticulitis. The appendix is unremarkable. Mild bowel wall thickening is noted in the jejunum in the pelvis.   Vascular/Lymphatic: Aortic atherosclerosis. No enlarged abdominal or pelvic lymph nodes.   Reproductive: Status post hysterectomy. No adnexal masses.   Other: Mesenteric edema is noted with free fluid at the gallbladder fossa, perihepatic space, and pelvis.   Musculoskeletal: Sternotomy wires are present over the midline. Degenerative changes are noted in the thoracolumbar spine. No acute osseous abnormality.   IMPRESSION: 1. Increased intrahepatic and extrahepatic biliary ductal dilatation. There is increased distention of the common bile duct measuring 1 cm with choledocholithiasis. Cholelithiasis is also noted. Free fluid is noted at the gallbladder fossa and perihepatic space posteriorly. Correlate clinically to exclude superimposed acute cholecystitis. 2. Jejunal bowel wall thickening in the pelvis, possible infectious or inflammatory enteritis. 3. Diffuse mesenteric edema and a small amount of free fluid in the pelvis. 4. Small bilateral pleural effusions. 5. Gastric wall thickening, possible gastritis. 6. Aortic atherosclerosis. 7. Remaining ancillary  findings as detailed above.  12/30/21 CMP remarkable for elevated glucose 112, low BUN 7, alk phos 32, albumin 2.7. LFTs normal at 17 each. CBC unremarkable, WBC 6.6, HGB 12.6, PLT 150  I reviewed available labs, medications, imaging, studies and related documents from the EMR.  Records reviewed and summarized above.   ROS General: NAD Cardiovascular: denies chest pain, denies DOE Pulmonary: denies cough, denies increased SOB Abdomen: endorses fair appetite, constipation, endorses continence of bowel GU: denies dysuria, endorses continence of urine MSK:  endorses generalized weakness, no falls reported Skin: denies rashes or wounds Neurological: denies pain, denies insomnia Psych: Endorses positive mood Heme/lymph/immuno: denies bruises, abnormal bleeding  Physical Exam: limited to audible only Current and past weights: Last weight noted was 75 lbs 3.2 ounces as of 10/07/21 CV:  able to speak in complete sentences without having to stop to breathe Pulmonary: No cough or audible wheezing Neuro:   no noted cognitive impairment Psych: non-anxious affect, A and O x 3   CURRENT PROBLEM LIST:  Patient Active Problem List   Diagnosis Date Noted   Elevated troponin 01/03/2022   Acute cholecystitis 12/26/2021   Secondary hypercoagulable state ([facility]) 02/04/2021   Unexplained weight loss 01/21/2021   Nausea 01/18/2021   Protein-calorie malnutrition, severe 01/16/2021   Persistent atrial fibrillation ([facility]) 01/14/2021   Sick sinus syndrome ([facility]) 01/14/2021   Syncope 01/14/2021   Anemia 01/14/2021   Weakness 01/14/2021   GI bleed 04/22/2020   Hypokalemia    Presbycusis of both ears 11/13/2019   Vertigo 03/27/2017   Arthritis 03/27/2017   Stroke ([facility])    Status post transcatheter  aortic valve replacement (TAVR) using bioprosthesis 01/03/2017   Chronic apical periodontitis 12/28/2016   Retained dental roots 12/28/2016   Bilateral maxillary lateral exostoses 63/84/6659   Diastolic  dysfunction    Chronic stable angina (HCC)    Tricuspid regurgitation    Pulmonary regurgitation    Left shoulder pain    Back pain-similar to pt's angina 12/01/2015   Carotid artery disease- s/p RCE 12/01/2015   Bowen's disease 03/16/2015   Dependent edema 10/30/2014   History of allergy to aspirin 06/02/2014   PAF (paroxysmal atrial fibrillation) (HCC)    Gastroesophageal reflux disease without esophagitis 12/05/2011   Hx of CABG 2006 with Maze 07/21/2011   Mitral regurgitation 07/21/2011   Allergic rhinitis 10/05/2010   Hyperlipidemia 93/57/0177   Uncomplicated asthma 93/90/3009   PAST MEDICAL HISTORY:  Active Ambulatory Problems    Diagnosis Date Noted   Uncomplicated asthma 23/30/0762   Allergic rhinitis 10/05/2010   Hyperlipidemia 10/05/2010   Hx of CABG 2006 with Maze 07/21/2011   Mitral regurgitation 07/21/2011   Gastroesophageal reflux disease without esophagitis 12/05/2011   PAF (paroxysmal atrial fibrillation) ([facility])    History of allergy to aspirin 06/02/2014   Dependent edema 10/30/2014   Bowen's disease 03/16/2015   Back pain-similar to pt's angina 12/01/2015   Carotid artery disease- s/p RCE 12/01/2015   Left shoulder pain    Diastolic dysfunction    Chronic stable angina (HCC)    Tricuspid regurgitation    Pulmonary regurgitation    Chronic apical periodontitis 12/28/2016   Retained dental roots 12/28/2016   Bilateral maxillary lateral exostoses 12/28/2016   Status post transcatheter aortic valve replacement (TAVR) using bioprosthesis 01/03/2017   Stroke ([facility])    Vertigo 03/27/2017   Arthritis 03/27/2017   Presbycusis of both ears 11/13/2019   GI bleed 04/22/2020   Hypokalemia    Persistent atrial fibrillation ([facility]) 01/14/2021   Sick sinus syndrome ([facility]) 01/14/2021   Syncope 01/14/2021   Anemia 01/14/2021   Weakness 01/14/2021   Protein-calorie malnutrition, severe 01/16/2021   Nausea 01/18/2021   Unexplained weight loss 01/21/2021   Secondary  hypercoagulable state ([facility]) 02/04/2021   Acute cholecystitis 12/26/2021   Elevated troponin 01/03/2022   Resolved Ambulatory Problems    Diagnosis Date Noted   DYSPNEA 10/19/2007   Cough 10/19/2007   Community acquired pneumonia 05/19/2014   Chest pain 10/23/2014   Pain in the chest    Aortic stenosis    SOB (shortness of breath)    Severe aortic stenosis    CAD (coronary artery disease)    Dental caries 12/28/2016   Chronic periodontitis 12/28/2016   Loose, teeth 12/28/2016   Gingival bleeding 12/28/2016   Hypotension    AKI (acute kidney injury) ([facility])    Hematochezia    Shock circulatory ([facility])    Past Medical History:  Diagnosis Date   Anxiety    Aspirin allergy    Asthma    Bronchitis    Eczema    Fibromyalgia    GERD (gastroesophageal reflux disease)    Heart murmur    Hiatal hernia    Hypertension    Kyphoscoliosis    Myocardial infarction ([facility]) 2003   Pneumonia 2015 ?   Skin cancer of arm    SOCIAL HX:  Social History   Tobacco Use   Smoking status: Never   Smokeless tobacco: Former    Types: Snuff    Quit date: 05/23/2001   Tobacco comments:    Former Snuff (02/04/2021)  Substance Use Topics  Alcohol use: No   FAMILY HX:  Family History  Problem Relation Age of Onset   Heart failure Mother    Other Father        Preferred Pharmacy: ALLERGIES:  Allergies  Allergen Reactions   Other Other (See Comments)    NO ACIDIC, TART< OR SPICY FOODS- DEVELOPS REFLUX OFTEN!!  PATIENT HAS TROUBLE SWALLOWING TABLETS!!   Bactrim [Sulfamethoxazole-Trimethoprim] Other (See Comments)    "makes her feel funny" or "unsteady"    Amitriptyline Hcl Rash   Aspirin Hives, Swelling, Rash and Other (See Comments)    Body became swollen   Tape Other (See Comments)    SKIN IS VERY THIN- WILL TEAR EASILY!!   Zetia [Ezetimibe] Rash     PERTINENT MEDICATIONS:  Outpatient Encounter Medications as of 01/10/2022  Medication Sig   acetaminophen (TYLENOL) 650 MG CR  tablet Take 650-1,300 mg by mouth 2 (two) times daily as needed for pain.   apixaban (ELIQUIS) 2.5 MG TABS tablet Take 1 tablet (2.5 mg total) by mouth 2 (two) times daily.   Carboxymethylcellulose Sodium (EYE DROPS OP) Apply 2 drops to eye daily as needed (gritty eyes).   Cholecalciferol (VITAMIN D-3) 25 MCG (1000 UT) CAPS Take 1,000 Units by mouth daily.   docusate sodium (COLACE) 100 MG capsule Take 100 mg by mouth daily.   Ensure (ENSURE) Take 237 mLs by mouth every other day.   fluticasone (FLOVENT HFA) 220 MCG/ACT inhaler INHALE 2 PUFFS INTO THE LUNGS TWICE DAILY (Patient taking differently: Inhale 2 puffs into the lungs daily in the afternoon.)   LINZESS 72 MCG capsule Take 1 capsule (72 mcg total) by mouth daily.   loratadine (CLARITIN) 10 MG tablet Take 1 tablet (10 mg total) by mouth daily.   nitroGLYCERIN (NITROSTAT) 0.4 MG SL tablet DISSOLVE 1 TABLET UNDER THE TONGUE EVERY 5 MINUTES AS NEEDED FOR CHEST PAIN (Patient taking differently: Place 0.4 mg under the tongue every 5 (five) minutes as needed for chest pain.)   OVER THE COUNTER MEDICATION Take by mouth daily as needed (cough). Guaifenesin liquid PO 1 sip by mouth daily as needed for cough   oxyCODONE (OXY IR/ROXICODONE) 5 MG immediate release tablet Take 0.5 tablets (2.5 mg total) by mouth every 12 (twelve) hours as needed for moderate pain or severe pain.   pantoprazole (PROTONIX) 40 MG tablet TAKE 1 TABLET BY MOUTH TWICE DAILY (Patient taking differently: Take 40 mg by mouth 2 (two) times daily.)   polyethylene glycol powder (MIRALAX) 17 GM/SCOOP powder Take 17 g by mouth 2 (two) times daily as needed for moderate constipation. (Patient not taking: Reported on 10/07/2021)   rosuvastatin (CRESTOR) 20 MG tablet Take 1 tablet (20 mg total) by mouth daily.   VENTOLIN HFA 108 (90 Base) MCG/ACT inhaler INHALE 2 PUFFS INTO THE LUNGS EVERY 6 HOURS AS NEEDED FOR WHEEZING OR SHORTNESS OF BREATH (Patient taking differently: Inhale 1-2 puffs  into the lungs as needed for shortness of breath or wheezing.)   Wheat Dextrin (BENEFIBER PO) Take 1 Scoop by mouth daily as needed (fiber).   No facility-administered encounter medications on file as of 01/10/2022.      Advance Care Planning/Goals of Care: Goals include to maximize quality of life and symptom management.  Identification  of a healthcare agent-daughter [name] Review of an advance directive document-MOST Decision not to resuscitate or to de-escalate disease focused treatments due to poor prognosis. CODE STATUS: MOST as of 12/30/21: DNR/DNI with limited additional intervention Antibiotics if indicated  IV fluids on a case by case, time limited basis No feeding tube.   I spent 46 minutes 27 sec providing this consultation providing Palliative Care counseling on goals of care.    Thank you for the opportunity to participate in the care of Ms. Fredrick.  The palliative care team will continue to follow. Please call our office at [phone] if we can be of additional assistance.   Marijo Conception, FNP-C  COVID-19 PATIENT SCREENING TOOL Asked and negative response unless otherwise noted:  Have you had symptoms of covid, tested positive or been in contact with someone with symptoms/positive test in the past 5-10 days?

## 2022-01-11 DIAGNOSIS — R29898 Other symptoms and signs involving the musculoskeletal system: Secondary | ICD-10-CM | POA: Diagnosis not present

## 2022-01-11 DIAGNOSIS — I5032 Chronic diastolic (congestive) heart failure: Secondary | ICD-10-CM | POA: Diagnosis not present

## 2022-01-11 DIAGNOSIS — I69398 Other sequelae of cerebral infarction: Secondary | ICD-10-CM | POA: Diagnosis not present

## 2022-01-11 DIAGNOSIS — K8062 Calculus of gallbladder and bile duct with acute cholecystitis without obstruction: Secondary | ICD-10-CM | POA: Diagnosis not present

## 2022-01-11 DIAGNOSIS — I110 Hypertensive heart disease with heart failure: Secondary | ICD-10-CM | POA: Diagnosis not present

## 2022-01-11 DIAGNOSIS — K851 Biliary acute pancreatitis without necrosis or infection: Secondary | ICD-10-CM | POA: Diagnosis not present

## 2022-01-12 ENCOUNTER — Telehealth: Payer: Self-pay

## 2022-01-12 DIAGNOSIS — K8062 Calculus of gallbladder and bile duct with acute cholecystitis without obstruction: Secondary | ICD-10-CM | POA: Diagnosis not present

## 2022-01-12 DIAGNOSIS — I69398 Other sequelae of cerebral infarction: Secondary | ICD-10-CM | POA: Diagnosis not present

## 2022-01-12 DIAGNOSIS — I110 Hypertensive heart disease with heart failure: Secondary | ICD-10-CM | POA: Diagnosis not present

## 2022-01-12 DIAGNOSIS — R29898 Other symptoms and signs involving the musculoskeletal system: Secondary | ICD-10-CM | POA: Diagnosis not present

## 2022-01-12 DIAGNOSIS — K851 Biliary acute pancreatitis without necrosis or infection: Secondary | ICD-10-CM | POA: Diagnosis not present

## 2022-01-12 DIAGNOSIS — I5032 Chronic diastolic (congestive) heart failure: Secondary | ICD-10-CM | POA: Diagnosis not present

## 2022-01-12 NOTE — Telephone Encounter (Signed)
Physical Therapist with [facility] called for a verbal order for Social worker evaluation and community resources. He can be reached at [phone].

## 2022-01-12 NOTE — Telephone Encounter (Signed)
April a nurse with [facility] home health called for verbal orders for skilled nursing once a week for 5 weeks. [phone].

## 2022-01-14 ENCOUNTER — Telehealth: Payer: Self-pay | Admitting: Cardiovascular Disease

## 2022-01-14 NOTE — Telephone Encounter (Signed)
Pt c/o Shortness Of Breath: STAT if SOB developed within the last 24 hours or pt is noticeably SOB on the phone  1. Are you currently SOB (can you hear that pt is SOB on the phone)? SOB  2. How long have you been experiencing SOB?  12/25/21  3. Are you SOB when sitting or when up moving around? Both; real weak  4. Are you currently experiencing any other symptoms? Lightheadedness,

## 2022-01-14 NOTE — Telephone Encounter (Signed)
Patient's daughter called in with concerns patient is having increased SOB and weakness. She was 12/26/21- 12/30/21 for acute pancreatitis secondary to gallstone along with Coley cystitis and choledocholithiasis. Underwent ERCP with stone removal. She is scheduled with APP for 01/18/22 for evaluation.

## 2022-01-15 NOTE — Progress Notes (Unsigned)
Cardiology Office Note:    Date:  01/18/2022   ID:  [name], DOB 1928-08-22, MRN [id number]  PCP:  Denita Lung, MD   [facility] Providers Cardiologist:  Lauree Chandler, MD Electrophysiologist:  [name], MD     Referring MD: Denita Lung, MD   Chief Complaint: shortness of breath  History of Present Illness:    [name] is a pleasant 88 y.o. female with a hx of CAD s/p CABG, atrial fibs s/p Maze procedure, CVA, carotid artery disease s/p right CEA, fibromyalgia, GERD, mild to moderate MR, severe AS s/p TAVR and sick sinus syndrome s/p pacemaker.  She underwent CABG in 2006 with Maze procedure.  Was found to have severe AS by echo in July 2017 along with moderate MR.  She initially refused treatment but underwent TAVR August 2018 with placement of a 23 mm Edwards SAPIEN 3 valve from the right femoral artery.  History of atrial fibrillation on Eliquis.  Cardiac monitor in 2018 in setting of dizziness showed pauses and possible second-degree AV block.  She was seen in the EP clinic by Dr. Curt Bears and Cardizem was changed to Lopressor.  Cardiac cath 10/27/2016 with 3/4 patent bypass grafts.  The LIMA to the diagonal was atretic but vein grafts were open to the LAD, circumflex and distal RCA.  She is known to have moderate carotid disease by last Dopplers June 2018. Echo 12/2017 with LVEF 60 to 65%, normally functioning bioprosthetic AVR, mild MR.  Admission with syncope and atrial fibrillation August 2022, found to have sick sinus syndrome.  Pacemaker was implanted.  Echo at that time revealed LVEF 55 to 60%, mild to moderate MR, normally functioning bioprosthetic AVR.   Admission 8/6 - 12/30/2021 for acute pancreatitis 2/2 gallstone along with cholecystitis and cholelithiasis for which she underwent ERCP with stone removal.  She was last seen in our office on 08/16/2021 by Dr. Angelena Form.  There were no changes to her treatment plan and she was advised to  return in 12 months for follow-up.  Her daughter called our office 01/14/2022 with concerns for increased SOB and weakness.  She was scheduled for appointment today.  Today, she is here with her daughter for evaluation of SOB. She is wheelchair-bound and seldomly ambulates at home. Central air is not working in their home but daughter insists patient is not overheated. Feels most comfortable when she is sitting in front of the [facility] unit. Gets up several times during the night to sit in this location. Has [facility] home health for palliative care. Symptoms started prior to recent hospitalization, likely in February or March 2023.  She denies pain from recent hospitalization for cholecystitis and ERCP. No chest pain, presyncope, syncope, or PND.  It is unclear whether or not she suffers from orthopnea because she has difficulty sleeping.  Past Medical History:  Diagnosis Date   Anxiety    Aortic Stenosis s/p TAVR    Echo 10/21: EF 55-60, no RWMA, mild asymmetric LVH, normal RVSF, RVSP 40.2 mmHg, mild MR, s/p TAVR with mean gradient 8.55mHg, no PVL   Arthritis    OSTEO   Aspirin allergy    on Plavix   Asthma    Bronchitis    CAD (coronary artery disease)    a. s/p CABG 2006 with Cox-Maze procedure.   Carotid artery disease ([place]    a. s/p R CEA.   Chronic stable angina (HCC)    Diastolic dysfunction    Eczema  Fibromyalgia    GERD (gastroesophageal reflux disease)    Heart murmur    Hiatal hernia    Hyperlipidemia    Hypertension    Kyphoscoliosis    Mitral regurgitation    Myocardial infarction [facility]) 2003   Stent to CFX   PAF (paroxysmal atrial fibrillation) ([place])    a. pt has h/o hematuria on Eliquis and has since refused anticoagulation   Pneumonia 2015 ?   Pulmonary regurgitation    Skin cancer of arm    Stroke [facility])    Tricuspid regurgitation     Past Surgical History:  Procedure Laterality Date   ABDOMINAL HYSTERECTOMY  1976   CAROTID ENDARTERECTOMY  2010   CORONARY  ANGIOPLASTY WITH STENT PLACEMENT     CORONARY ARTERY BYPASS GRAFT  01/2005   LIMA-D1; SVG-LAD; SVG-OM; SVG-PDA   ERCP N/A 12/28/2021   Procedure: ENDOSCOPIC RETROGRADE CHOLANGIOPANCREATOGRAPHY (ERCP);  Surgeon: Carol Ada, MD;  Location: [place];  Service: Gastroenterology;  Laterality: N/A;   ESOPHAGOGASTRODUODENOSCOPY (EGD) WITH PROPOFOL N/A 03/19/2021   Procedure: ESOPHAGOGASTRODUODENOSCOPY (EGD) WITH PROPOFOL;  Surgeon: Carol Ada, MD;  Location: [facility];  Service: Endoscopy;  Laterality: N/A;   EYE SURGERY     MULTIPLE EXTRACTIONS WITH ALVEOLOPLASTY  12/28/2016   Extraction of tooth #'s 2- 5,7-10, 12,13,17-20,and 22-29 with alveoloplasty and maxillary right and left lateral exostoses reductions   MULTIPLE EXTRACTIONS WITH ALVEOLOPLASTY N/A 12/28/2016   Procedure: Extraction of tooth #'s 2- 5,7-10, 12,13,17-20,and 22-29 with alveoloplasty and maxillary right and left lateral exostoses reductions;  Surgeon: Lenn Cal, DDS;  Location: [place];  Service: Oral Surgery;  Laterality: N/A;   PACEMAKER IMPLANT N/A 01/15/2021   Procedure: PACEMAKER IMPLANT;  Surgeon: Constance Haw, MD;  Location: [place] [facility];  Service: Cardiovascular;  Laterality: N/A;   REMOVAL OF STONES  12/28/2021   Procedure: REMOVAL OF STONES;  Surgeon: Carol Ada, MD;  Location: [place];  Service: Gastroenterology;;   RIGHT/LEFT HEART CATH AND CORONARY/GRAFT ANGIOGRAPHY N/A 10/27/2016   Procedure: Right/Left Heart Cath and Coronary/Graft Angiography;  Surgeon: Burnell Blanks, MD;  Location: [place] [facility];  Service: Cardiovascular;  Laterality: N/A;   SAVORY DILATION N/A 03/19/2021   Procedure: SAVORY DILATION;  Surgeon: Carol Ada, MD;  [facility];  Service: Endoscopy;  Laterality: N/A;   SPHINCTEROTOMY  12/28/2021   Procedure: SPHINCTEROTOMY;  Surgeon: Carol Ada, MD;  Location: [place];  Service: Gastroenterology;;   TEE WITHOUT CARDIOVERSION N/A  01/03/2017   Procedure: TRANSESOPHAGEAL ECHOCARDIOGRAM (TEE);  Surgeon: Burnell Blanks, MD;  Location: [place];  Service: Open Heart Surgery;  Laterality: N/A;   TONSILLECTOMY     TRANSCATHETER AORTIC VALVE REPLACEMENT, TRANSFEMORAL N/A 01/03/2017   Procedure: TRANSCATHETER AORTIC VALVE REPLACEMENT, TRANSFEMORAL;  Surgeon: Burnell Blanks, MD;  Location: [place];  Service: Open Heart Surgery;  Laterality: N/A;   TUMOR REMOVAL      Current Medications: Current Meds  Medication Sig   acetaminophen (TYLENOL) 650 MG CR tablet Take 650-1,300 mg by mouth 2 (two) times daily as needed for pain.   apixaban (ELIQUIS) 2.5 MG TABS tablet Take 1 tablet (2.5 mg total) by mouth 2 (two) times daily.   Carboxymethylcellulose Sodium (EYE DROPS OP) Apply 2 drops to eye daily as needed (gritty eyes).   Cholecalciferol (VITAMIN D-3) 25 MCG (1000 UT) CAPS Take 1,000 Units by mouth daily.   docusate sodium (COLACE) 100 MG capsule Take 100 mg by mouth daily.   Ensure (ENSURE) Take 237 mLs  by mouth every other day.   fluticasone (FLOVENT HFA) 220 MCG/ACT inhaler INHALE 2 PUFFS INTO THE LUNGS TWICE DAILY   LINZESS 72 MCG capsule Take 1 capsule (72 mcg total) by mouth daily.   loratadine (CLARITIN) 10 MG tablet Take 1 tablet (10 mg total) by mouth daily.   nitroGLYCERIN (NITROSTAT) 0.4 MG SL tablet DISSOLVE 1 TABLET UNDER THE TONGUE EVERY 5 MINUTES AS NEEDED FOR CHEST PAIN   OVER THE COUNTER MEDICATION Take by mouth daily as needed (cough). Guaifenesin liquid PO 1 sip by mouth daily as needed for cough   pantoprazole (PROTONIX) 40 MG tablet TAKE 1 TABLET BY MOUTH TWICE DAILY   polyethylene glycol powder (MIRALAX) 17 GM/SCOOP powder Take 17 g by mouth 2 (two) times daily as needed for moderate constipation.   rosuvastatin (CRESTOR) 20 MG tablet Take 1 tablet (20 mg total) by mouth daily.   VENTOLIN HFA 108 (90 Base) MCG/ACT inhaler INHALE 2 PUFFS INTO THE LUNGS EVERY 6 HOURS AS NEEDED FOR WHEEZING OR  SHORTNESS OF BREATH   Wheat Dextrin (BENEFIBER PO) Take 1 Scoop by mouth daily as needed (fiber).     Allergies:   Other, Bactrim [sulfamethoxazole-trimethoprim], Amitriptyline hcl, Aspirin, Tape, and Zetia [ezetimibe]   Social History   Socioeconomic History   Marital status: Widowed    Spouse name: Not on file   Number of children: 3   Years of education: Not on file   Highest education level: Not on file  Occupational History    Employer: RETIRED  Tobacco Use   Smoking status: Never   Smokeless tobacco: Former    Types: Snuff    Quit date: 05/23/2001   Tobacco comments:    Former Snuff (02/04/2021)  Vaping Use   Vaping Use: Never used  Substance and Sexual Activity   Alcohol use: No   Drug use: No   Sexual activity: Not Currently  Other Topics Concern   Not on file  Social History Narrative   Not on file   Social Determinants of Health   Financial Resource Strain: Low Risk  (04/09/2021)   Overall Financial Resource Strain (CARDIA)    Difficulty of Paying Living Expenses: Not hard at all  Food Insecurity: No Food Insecurity (04/09/2021)   Hunger Vital Sign    Worried About Running Out of Food in the Last Year: Never true    [place] in the Last Year: Never true  Transportation Needs: No Transportation Needs (12/31/2021)   PRAPARE - Hydrologist (Medical): No    Lack of Transportation (Non-Medical): No  Physical Activity: Insufficiently Active (04/09/2021)   Exercise Vital Sign    Days of Exercise per Week: 2 days    Minutes of Exercise per Session: 10 min  Stress: No Stress Concern Present (04/09/2021)   [place]    Feeling of Stress : Not at all  Social Connections: Not on file     Family History: The patient's family history includes Heart failure in her mother; Other in her father.  ROS:   Please see the history of present illness.    + shortness of  breath All other systems reviewed and are negative.  Labs/Other Studies Reviewed:    The following studies were reviewed today:    Recent Labs: 12/26/2021: B Natriuretic Peptide 291.9 12/28/2021: Magnesium 1.8 12/30/2021: ALT 17; BUN 7; Creatinine, Ser 0.44; Hemoglobin 12.6; Platelets 150; Potassium 3.6; Sodium 140  Recent Lipid Panel    Component Value Date/Time   CHOL 151 11/13/2019 1529   TRIG 84 11/13/2019 1529   HDL 77 11/13/2019 1529   CHOLHDL 2.0 11/13/2019 1529   CHOLHDL 2.0 08/21/2015 0001   VLDL 17 08/21/2015 0001   LDLCALC 58 11/13/2019 1529     Risk Assessment/Calculations:    CHA2DS2-VASc Score = 8  {This indicates a 10.8% annual risk of stroke. The patient's score is based upon: CHF History: 1 HTN History: 1 Diabetes History: 0 Stroke History: 2 Vascular Disease History: 1 Age Score: 2 Gender Score: 1      Physical Exam:    VS:  BP (!) 120/40   Pulse 69   Ht '5\' 2"'$  (1.575 m)   Wt 79 lb (35.8 kg)   SpO2 97%   BMI 14.45 kg/m     Wt Readings from Last 3 Encounters:  01/18/22 79 lb (35.8 kg)  12/30/21 95 lb 0.3 oz (43.1 kg)  10/07/21 75 lb 3.2 oz (34.1 kg)     GEN: Frail, chronically ill appearing in no acute distress HEENT: Normal NECK: No JVD; No carotid bruits CARDIAC: Irregular RR, no murmurs, rubs, gallops RESPIRATORY:  Clear to auscultation without rales, wheezing or rhonchi  ABDOMEN: Soft, non-tender, non-distended MUSCULOSKELETAL:  No edema; No deformity. 2+ pedal pulses, equal bilaterally SKIN: Warm and dry NEUROLOGIC:  Alert and oriented to person, thought she was in the hospital PSYCHIATRIC:  Pleasantly demented  EKG:  EKG is not ordered today.     Diagnoses:    1. Severe aortic stenosis   2. Protein-calorie malnutrition, severe   3. Sick sinus syndrome (HCC)   4. Persistent atrial fibrillation ([place])   5. S/P TAVR (transcatheter aortic valve replacement)   6. Shortness of breath    Assessment and Plan:     Shortness of  breath: Primary complaint is shortness of breath. Gets up multiple times during the night to sit in front of the air conditioner, however she denies that she is hot. Unclear as to why this improves her symptoms. No evidence of volume overload on exam. Complex patient with multiple co-morbidities including malnutrition and dementia. Recent finding of severe MR on echo, history of TAVR, persistent a fib with high burden on device check, CAD all likely contributing to SOB. Will discuss next steps with Dr. Angelena Form, primary cardiologist.  Encouraged daughter to check with home health agency for availability/coverage of medical equipment as hospital bed may be more comfortable for her.  CAD s/p CABG: Most recent cath 10/2016 with severe triple-vessel CAD s/p four-vessel CABG with 3/4 patent bypass grafts. Mid LAD chronically occluded, vein graft to mid LAD patent.  Proximal Cx with moderate stenosis, stent to Cx patent, vein graft to second OM is patent.  Mid RCA chronically occluded.  Vein graft to distal RCA patent.  LIMA to diagonal is attracted. Diagonal branch has moderate nonobstructive disease. She underwent TAVR 12/2016. She denies chest pain. Has SOB persistent for about 6 months. Do not favor further ischemic evaluation at this time.  Severe AS s/p TAVR: Normal valve function on echo 8/8 with mean gradient 7.7 mmHg, trivial aortic insufficiency.   A Fib on chronic anticoagulation: A fib burden 94% on recent remote device check. Discussed that this may be a cause of increased SOB. Rate is well-controlled. No bleeding concerns on Eliquis.  No evidence of volume overload on exam.  Daughter thought that pacemaker would prevent further A-fib. Advised her of the purpose of the  PPM. Will continue with rate control and anticoagulation at this time.   Mitral valve regurgitation: Worsening valve function on recent echo. 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 Holosystolic prolapse of both  leaflets of the valve. She is symptomatic with SOB. Do not feel that she is a candidate for intervention due to frailty, comorbidities  Hypertension: BP is soft.  She also has occasional dizziness.  No medication changes today.  PPM implant: Continue routine remote device checks. Needs follow-up with Dr. Curt Bears as scheduled.  Disposition: Will discuss management with primary cardiologist, Dr. Angelena Form  Medication Adjustments/Labs and Tests Ordered: Current medicines are reviewed at length with the patient today.  Concerns regarding medicines are outlined above.  No orders of the defined types were placed in this encounter.  No orders of the defined types were placed in this encounter.   Patient Instructions  Medication Instructions:   Your physician recommends that you continue on your current medications as directed. Please refer to the Current Medication list given to you today.   *If you need a refill on your cardiac medications before your next appointment, please call your pharmacy*   Lab Work:  None ordered.  If you have labs (blood work) drawn today and your tests are completely normal, you will receive your results only by: [name] (if you have MyChart) OR A paper copy in the mail If you have any lab test that is abnormal or we need to change your treatment, we will call you to review the results.   Testing/Procedures:  None ordered.   Follow-Up: At [facility], you and your health needs are our priority.  As part of our continuing mission to provide you with exceptional heart care, we have created designated Provider Care Teams.  These Care Teams include your primary Cardiologist (physician) and Advanced Practice Providers (APPs -  Physician Assistants and Nurse Practitioners) who all work together to provide you with the care you need, when you need it.  We recommend signing up for the patient portal called "MyChart".  Sign up information is  provided on this After Visit Summary.  MyChart is used to connect with patients for Virtual Visits (Telemedicine).  Patients are able to view lab/test results, encounter notes, upcoming appointments, etc.  Non-urgent messages can be sent to your provider as well.   To learn more about what you can do with MyChart, go to [email or web address].    Your next appointment:   6 month(s)  The format for your next appointment:   In Person  Provider:   Lauree Chandler, MD     Other Instructions  The office will call you with appointment for Dr. Curt Bears.   Important Information About Sugar         Signed, Emmaline Life, NP  01/18/2022 5:41 PM    [place]

## 2022-01-17 DIAGNOSIS — I69398 Other sequelae of cerebral infarction: Secondary | ICD-10-CM | POA: Diagnosis not present

## 2022-01-17 DIAGNOSIS — R29898 Other symptoms and signs involving the musculoskeletal system: Secondary | ICD-10-CM | POA: Diagnosis not present

## 2022-01-17 DIAGNOSIS — K851 Biliary acute pancreatitis without necrosis or infection: Secondary | ICD-10-CM | POA: Diagnosis not present

## 2022-01-17 DIAGNOSIS — K8062 Calculus of gallbladder and bile duct with acute cholecystitis without obstruction: Secondary | ICD-10-CM | POA: Diagnosis not present

## 2022-01-17 DIAGNOSIS — I5032 Chronic diastolic (congestive) heart failure: Secondary | ICD-10-CM | POA: Diagnosis not present

## 2022-01-17 DIAGNOSIS — I110 Hypertensive heart disease with heart failure: Secondary | ICD-10-CM | POA: Diagnosis not present

## 2022-01-18 ENCOUNTER — Ambulatory Visit (INDEPENDENT_AMBULATORY_CARE_PROVIDER_SITE_OTHER): Payer: Medicare Other

## 2022-01-18 ENCOUNTER — Ambulatory Visit: Payer: Medicare Other | Attending: Nurse Practitioner | Admitting: Nurse Practitioner

## 2022-01-18 ENCOUNTER — Telehealth: Payer: Self-pay | Admitting: Cardiovascular Disease

## 2022-01-18 ENCOUNTER — Inpatient Hospital Stay: Payer: Medicare Other | Admitting: Family Medicine

## 2022-01-18 ENCOUNTER — Encounter: Payer: Self-pay | Admitting: Nurse Practitioner

## 2022-01-18 VITALS — BP 120/40 | HR 69 | Ht 62.0 in | Wt 79.0 lb

## 2022-01-18 DIAGNOSIS — I495 Sick sinus syndrome: Secondary | ICD-10-CM

## 2022-01-18 DIAGNOSIS — Z952 Presence of prosthetic heart valve: Secondary | ICD-10-CM | POA: Diagnosis not present

## 2022-01-18 DIAGNOSIS — I350 Nonrheumatic aortic (valve) stenosis: Secondary | ICD-10-CM | POA: Diagnosis not present

## 2022-01-18 DIAGNOSIS — E43 Unspecified severe protein-calorie malnutrition: Secondary | ICD-10-CM | POA: Diagnosis not present

## 2022-01-18 DIAGNOSIS — R0602 Shortness of breath: Secondary | ICD-10-CM | POA: Diagnosis not present

## 2022-01-18 DIAGNOSIS — I4819 Other persistent atrial fibrillation: Secondary | ICD-10-CM | POA: Diagnosis not present

## 2022-01-18 LAB — CUP PACEART REMOTE DEVICE CHECK
Battery Remaining Longevity: 95 mo
Battery Remaining Percentage: 91 %
Battery Voltage: 2.99 V
Brady Statistic AP VP Percent: 16 %
Brady Statistic AP VS Percent: 1 %
Brady Statistic AS VP Percent: 82 %
Brady Statistic AS VS Percent: 1 %
Brady Statistic RA Percent Paced: 1 %
Brady Statistic RV Percent Paced: 99 %
Date Time Interrogation Session: 20230829020014
Implantable Lead Implant Date: 20220826
Implantable Lead Implant Date: 20220826
Implantable Lead Location: 753859
Implantable Lead Location: 753860
Implantable Pulse Generator Implant Date: 20220826
Lead Channel Impedance Value: 430 Ohm
Lead Channel Impedance Value: 490 Ohm
Lead Channel Pacing Threshold Amplitude: 0.75 V
Lead Channel Pacing Threshold Pulse Width: 0.5 ms
Lead Channel Sensing Intrinsic Amplitude: 12 mV
Lead Channel Sensing Intrinsic Amplitude: 2.6 mV
Lead Channel Setting Pacing Amplitude: 2 V
Lead Channel Setting Pacing Amplitude: 2.5 V
Lead Channel Setting Pacing Pulse Width: 0.5 ms
Lead Channel Setting Sensing Sensitivity: 2 mV
Pulse Gen Model: 2272
Pulse Gen Serial Number: 3953466

## 2022-01-18 NOTE — Telephone Encounter (Signed)
I spoke with [name] and let him know patient has hospital f/u today at 2:20 pm  and all her issues can be addressed at that time. He was agreeable with this.

## 2022-01-18 NOTE — Telephone Encounter (Signed)
Physical therapist calling to report - oxygen is 93-98 and has intermitted SOB from visit today.  He would like a call back to get clarification on if the patient is okay to lay on left side. Pt states she was not okay to lay on left side and he wants to make sure this is the case.

## 2022-01-18 NOTE — Patient Instructions (Signed)
Medication Instructions:   Your physician recommends that you continue on your current medications as directed. Please refer to the Current Medication list given to you today.   *If you need a refill on your cardiac medications before your next appointment, please call your pharmacy*   Lab Work:  None ordered.  If you have labs (blood work) drawn today and your tests are completely normal, you will receive your results only by: [place] (if you have MyChart) OR A paper copy in the mail If you have any lab test that is abnormal or we need to change your treatment, we will call you to review the results.   Testing/Procedures:  None ordered.   Follow-Up: At [facility], you and your health needs are our priority.  As part of our continuing mission to provide you with exceptional heart care, we have created designated Provider Care Teams.  These Care Teams include your primary Cardiologist (physician) and Advanced Practice Providers (APPs -  Physician Assistants and Nurse Practitioners) who all work together to provide you with the care you need, when you need it.  We recommend signing up for the patient portal called "MyChart".  Sign up information is provided on this After Visit Summary.  MyChart is used to connect with patients for Virtual Visits (Telemedicine).  Patients are able to view lab/test results, encounter notes, upcoming appointments, etc.  Non-urgent messages can be sent to your provider as well.   To learn more about what you can do with MyChart, go to [email or web address].    Your next appointment:   6 month(s)  The format for your next appointment:   In Person  Provider:   Lauree Chandler, MD     Other Instructions  The office will call you with appointment for Dr. Curt Bears.   Important Information About Sugar

## 2022-01-19 DIAGNOSIS — I5032 Chronic diastolic (congestive) heart failure: Secondary | ICD-10-CM | POA: Diagnosis not present

## 2022-01-19 DIAGNOSIS — I110 Hypertensive heart disease with heart failure: Secondary | ICD-10-CM | POA: Diagnosis not present

## 2022-01-19 DIAGNOSIS — K851 Biliary acute pancreatitis without necrosis or infection: Secondary | ICD-10-CM | POA: Diagnosis not present

## 2022-01-19 DIAGNOSIS — K8062 Calculus of gallbladder and bile duct with acute cholecystitis without obstruction: Secondary | ICD-10-CM | POA: Diagnosis not present

## 2022-01-19 DIAGNOSIS — I69398 Other sequelae of cerebral infarction: Secondary | ICD-10-CM | POA: Diagnosis not present

## 2022-01-19 DIAGNOSIS — R29898 Other symptoms and signs involving the musculoskeletal system: Secondary | ICD-10-CM | POA: Diagnosis not present

## 2022-01-25 DIAGNOSIS — I69398 Other sequelae of cerebral infarction: Secondary | ICD-10-CM | POA: Diagnosis not present

## 2022-01-25 DIAGNOSIS — K851 Biliary acute pancreatitis without necrosis or infection: Secondary | ICD-10-CM | POA: Diagnosis not present

## 2022-01-25 DIAGNOSIS — I5032 Chronic diastolic (congestive) heart failure: Secondary | ICD-10-CM | POA: Diagnosis not present

## 2022-01-25 DIAGNOSIS — R29898 Other symptoms and signs involving the musculoskeletal system: Secondary | ICD-10-CM | POA: Diagnosis not present

## 2022-01-25 DIAGNOSIS — I110 Hypertensive heart disease with heart failure: Secondary | ICD-10-CM | POA: Diagnosis not present

## 2022-01-25 DIAGNOSIS — K8062 Calculus of gallbladder and bile duct with acute cholecystitis without obstruction: Secondary | ICD-10-CM | POA: Diagnosis not present

## 2022-01-26 ENCOUNTER — Telehealth: Payer: Self-pay

## 2022-01-26 ENCOUNTER — Encounter: Payer: Self-pay | Admitting: Family Medicine

## 2022-01-26 ENCOUNTER — Ambulatory Visit (INDEPENDENT_AMBULATORY_CARE_PROVIDER_SITE_OTHER): Payer: Medicare Other | Admitting: Family Medicine

## 2022-01-26 VITALS — BP 90/46 | HR 70 | Temp 98.6°F | Wt 79.0 lb

## 2022-01-26 DIAGNOSIS — Z515 Encounter for palliative care: Secondary | ICD-10-CM

## 2022-01-26 DIAGNOSIS — I69398 Other sequelae of cerebral infarction: Secondary | ICD-10-CM | POA: Diagnosis not present

## 2022-01-26 DIAGNOSIS — I6523 Occlusion and stenosis of bilateral carotid arteries: Secondary | ICD-10-CM

## 2022-01-26 DIAGNOSIS — E43 Unspecified severe protein-calorie malnutrition: Secondary | ICD-10-CM | POA: Diagnosis not present

## 2022-01-26 DIAGNOSIS — K851 Biliary acute pancreatitis without necrosis or infection: Secondary | ICD-10-CM | POA: Diagnosis not present

## 2022-01-26 DIAGNOSIS — R29898 Other symptoms and signs involving the musculoskeletal system: Secondary | ICD-10-CM | POA: Diagnosis not present

## 2022-01-26 DIAGNOSIS — Z8719 Personal history of other diseases of the digestive system: Secondary | ICD-10-CM

## 2022-01-26 DIAGNOSIS — K8062 Calculus of gallbladder and bile duct with acute cholecystitis without obstruction: Secondary | ICD-10-CM | POA: Diagnosis not present

## 2022-01-26 DIAGNOSIS — I495 Sick sinus syndrome: Secondary | ICD-10-CM | POA: Diagnosis not present

## 2022-01-26 DIAGNOSIS — I110 Hypertensive heart disease with heart failure: Secondary | ICD-10-CM | POA: Diagnosis not present

## 2022-01-26 DIAGNOSIS — K8042 Calculus of bile duct with acute cholecystitis without obstruction: Secondary | ICD-10-CM | POA: Insufficient documentation

## 2022-01-26 DIAGNOSIS — I5032 Chronic diastolic (congestive) heart failure: Secondary | ICD-10-CM | POA: Diagnosis not present

## 2022-01-26 MED ORDER — PANTOPRAZOLE SODIUM 40 MG PO TBEC: 40.0000 mg | DELAYED_RELEASE_TABLET | Freq: Two times a day (BID) | ORAL | 3 refills | Status: DC

## 2022-01-26 MED ORDER — ROSUVASTATIN CALCIUM 20 MG PO TABS: 20.0000 mg | ORAL_TABLET | Freq: Every day | ORAL | 0 refills | Status: DC

## 2022-01-26 NOTE — Progress Notes (Signed)
   Subjective:    Patient ID: [name], female    DOB: 1928/06/06, 88 y.o.   MRN: [id number]  HPI She is here for follow-up visit.  She was admitted to the hospital in early August for treatment of nausea and vomiting and found to have cholelithiasis.  She subsequently had an endoscopic procedure with removal of the stones.  Since then she has done fairly well having no abdominal pain nausea or vomiting.  While in the hospital she was referred to palliative care and I have contacted her.  She is also being cared for by [facility].  Her daughter who is her primary care provider.  She recently saw cardiology because of difficulty with shortness of breath but at this time they are not adding any medications.  Her daughter states that the shortness of breath is keeping her awake at night.   Review of Systems     Objective:   Physical Exam Alert and in no distress.  Breathing pattern appears normal.  Pulse ox is in the normal range.  Cardiac exam shows irregular rhythm without murmurs or gallops.  Lungs are clear to auscultation. The hospitalization labs x-rays and endoscopy was reviewed.      Assessment & Plan:  History of cholecystitis  Protein-calorie malnutrition, severe  Sick sinus syndrome ([place])  Palliative care patient At this point she seems to be doing fairly well having no more difficulty with abdominal pain.  She has seen cardiology and plans to see them again in the near future.  The daughter and [name] are uncertain as to what palliative care is doing and I recommended that they call and find out what their plans are in regard to working with them and with [facility].

## 2022-01-26 NOTE — Telephone Encounter (Signed)
Pt. Daughter said at check out they forgot to show you her toe on her lt. Foot. She said she gets it taking off usually because it gets infected and it is looking infected now. They wanted to know if you could possibly call in an anti biotic for her toe or a cream for it. She also needs her Pantoprazole and Rosuvastatin to [facility] on [address].

## 2022-01-26 NOTE — Addendum Note (Signed)
Addended by: Denita Lung on: 01/26/2022 05:20 PM   Modules accepted: Orders

## 2022-01-28 ENCOUNTER — Telehealth: Payer: Self-pay | Admitting: Cardiology

## 2022-01-28 NOTE — Telephone Encounter (Signed)
Called patient's daughter, DPR, back about message. Patient's daughter stated patient has been having SOB and having to set in front of the window [facility] unit to help her. Patient's Pulse Ox shows 96%. Patient's daughter is wanting to get in sooner with Dr. Curt Bears. She thinks it could be the patient's A. FIB causing her SOB. Informed her that it could be anything causing her SOB, with patient's history. She does not think patient needs to go to the ED. Informed her if patient is having SOB all the time she needs to go to the ED, or at least urgent care. Patient's daughter stated she would like to see our office next week. Since patient has been seen in A. FIB clinic will see if they have any openings. Will also see if patient can get in to see Dr. Curt Bears earlier.

## 2022-01-28 NOTE — Telephone Encounter (Signed)
Pt c/o Shortness Of Breath: STAT if SOB developed within the last 24 hours or pt is noticeably SOB on the phone  1. Are you currently SOB (can you hear that pt is SOB on the phone)? no  2. How long have you been experiencing SOB? months  3. Are you SOB when sitting or when up moving around? both  4. Are you currently experiencing any other symptoms?  Daughter calling in because is having a difficult time breathing. She sits in front the air condition to help her breath. Calling to see what can be done

## 2022-01-31 MED ORDER — DOXYCYCLINE HYCLATE 100 MG PO TABS: 100.0000 mg | ORAL_TABLET | Freq: Two times a day (BID) | ORAL | 0 refills | Status: DC

## 2022-01-31 NOTE — Addendum Note (Signed)
Addended by: Denita Lung on: 01/31/2022 12:17 PM   Modules accepted: Orders

## 2022-01-31 NOTE — Telephone Encounter (Signed)
Per Dr. Angelena Form, Pt was just seen recently by Christen Bame for similar symptoms and had no volume overload on exam. The patient and her daughter are very anxious at baseline. If there is no new swelling, I doubt that this is related to CHF. I am rounding this week and not in the office. Can we touch base with her today and see how she is feeling and if needed add her onto a DOD schedule this week?   Patient has appointment with A. FIB clinic on Wednesday. Informed patient of Dr. Camillia Herter message. Informed her that he did not think her SOB is related to her CHF. Patient's daughter stated through out the day the patient's legs would swell, then after sleeping in the morning they are back down. Encouraged them to keep their appointment with A. FIB clinic and call back with any other questions.

## 2022-01-31 NOTE — Telephone Encounter (Signed)
After her last encounter she did have a question about her toe being infected.  A picture was sent which did show some slight erythema.  This could be a cellulitis and I will therefore treat this with doxycycline.

## 2022-02-02 ENCOUNTER — Encounter (HOSPITAL_COMMUNITY): Payer: Self-pay | Admitting: Physician Assistant

## 2022-02-02 ENCOUNTER — Ambulatory Visit (HOSPITAL_COMMUNITY)
Admission: RE | Admit: 2022-02-02 | Discharge: 2022-02-02 | Disposition: A | Discharge status: Home / Self Care | Payer: Medicare Other | Source: Ambulatory Visit | Attending: Physician Assistant | Admitting: Physician Assistant

## 2022-02-02 VITALS — BP 148/70 | HR 92 | Ht 62.0 in | Wt 80.4 lb

## 2022-02-02 DIAGNOSIS — Z951 Presence of aortocoronary bypass graft: Secondary | ICD-10-CM | POA: Insufficient documentation

## 2022-02-02 DIAGNOSIS — D6869 Other thrombophilia: Secondary | ICD-10-CM | POA: Diagnosis not present

## 2022-02-02 DIAGNOSIS — I110 Hypertensive heart disease with heart failure: Secondary | ICD-10-CM | POA: Insufficient documentation

## 2022-02-02 DIAGNOSIS — I25118 Atherosclerotic heart disease of native coronary artery with other forms of angina pectoris: Secondary | ICD-10-CM | POA: Insufficient documentation

## 2022-02-02 DIAGNOSIS — I4819 Other persistent atrial fibrillation: Secondary | ICD-10-CM | POA: Insufficient documentation

## 2022-02-02 DIAGNOSIS — Z8673 Personal history of transient ischemic attack (TIA), and cerebral infarction without residual deficits: Secondary | ICD-10-CM | POA: Diagnosis not present

## 2022-02-02 DIAGNOSIS — Z8249 Family history of ischemic heart disease and other diseases of the circulatory system: Secondary | ICD-10-CM | POA: Insufficient documentation

## 2022-02-02 DIAGNOSIS — Z87891 Personal history of nicotine dependence: Secondary | ICD-10-CM | POA: Insufficient documentation

## 2022-02-02 DIAGNOSIS — Z7901 Long term (current) use of anticoagulants: Secondary | ICD-10-CM | POA: Diagnosis not present

## 2022-02-02 DIAGNOSIS — I5020 Unspecified systolic (congestive) heart failure: Secondary | ICD-10-CM | POA: Diagnosis not present

## 2022-02-02 NOTE — Progress Notes (Signed)
Primary Care Physician: Denita Lung, MD Primary Cardiologist: Dr Angelena Form  Primary Electrophysiologist: Dr Curt Bears Referring Physician: Dr Gerre Pebbles [name] is a 88 y.o. female with a history of CAD (CABG and MAZE 2006), prior stroke, PVD (R CEA ), VHD severe AS > TAVR (2018), HTN, HLD, sinus node dysfunction s/p PPM, HFrEF, atrial fibrillation who presents for follow up in the [facility]. Patient is on Eliquis for a CHADS2VASC score of 7. Patient had a PPM placed 01/15/21 for symptomatic bradycardia. She was in afib with significant pauses (10 seconds). Seen at this office 02/04/21 and a conservative rate control strategy was pursued. She has been essentially in persistent afib ever since.   On follow up today, patient and her daughter report that she has been more SOB recently. She sits in front of the air conditioning as this seems to be the only thing that helps. Last echo showed decrease in EF 40-45% with severe MR. No bleeding issues on anticoagulation.   Today, she denies symptoms of palpitations, chest pain, orthopnea, PND, lower extremity edema, dizziness, presyncope, syncope, snoring, daytime somnolence, bleeding, or neurologic sequela. The patient is tolerating medications without difficulties and is otherwise without complaint today.    Atrial Fibrillation Risk Factors:  she does not have symptoms or diagnosis of sleep apnea. she does not have a history of rheumatic fever.   she has a BMI of Body mass index is 14.71 kg/m.Marland Kitchen Filed Weights   02/02/22 1348  Weight: 36.5 kg    Family History  Problem Relation Age of Onset   Heart failure Mother    Other Father      Atrial Fibrillation Management history:  Previous antiarrhythmic drugs: none Previous cardioversions: none Previous ablations: MAZE 2006 CHADS2VASC score: 8 Anticoagulation history: Eliquis   Past Medical History:  Diagnosis Date   Anxiety    Aortic Stenosis s/p  TAVR    Echo 10/21: EF 55-60, no RWMA, mild asymmetric LVH, normal RVSF, RVSP 40.2 mmHg, mild MR, s/p TAVR with mean gradient 8.73mHg, no PVL   Arthritis    OSTEO   Aspirin allergy    on Plavix   Asthma    Bronchitis    CAD (coronary artery disease)    a. s/p CABG 2006 with Cox-Maze procedure.   [place]id [place] ([place]    a. s/p R CEA.   Chronic stable angina (HCC)    Diastolic dysfunction    Eczema    Fibromyalgia    GERD (gastroesophageal reflux disease)    Heart murmur    Hiatal hernia    Hyperlipidemia    Hypertension    Kyphoscoliosis    Mitral regurgitation    Myocardial infarction ([facility] 2003   Stent to CFX   PAF ([place]oxysmal atrial fibrillation) ([place]    a. pt has h/o hematuria on Eliquis and has since refused anticoagulation   Pneumonia 2015 ?   Pulmonary regurgitation    Skin cancer of arm    Stroke ([facility]    Tricuspid regurgitation    Past Surgical History:  Procedure Laterality Date   ABDOMINAL HYSTERECTOMY  1976   CAROTID ENDARTERECTOMY  2010   CORONARY ANGIOPLASTY WITH STENT PLACEMENT     CORONARY ARTERY BYPASS GRAFT  01/2005   LIMA-D1; SVG-LAD; SVG-OM; SVG-PDA   ERCP N/A 12/28/2021   Procedure: ENDOSCOPIC RETROGRADE CHOLANGIOPANCREATOGRAPHY (ERCP);  Surgeon: HCarol Ada MD;  [place]  Service: Gastroenterology;  Laterality: N/A;   ESOPHAGOGASTRODUODENOSCOPY (EGD)  WITH PROPOFOL N/A 03/19/2021   Procedure: ESOPHAGOGASTRODUODENOSCOPY (EGD) WITH PROPOFOL;  Surgeon: Carol Ada, MD;  [facility];  Service: Endoscopy;  Laterality: N/A;   EYE SURGERY     MULTIPLE EXTRACTIONS WITH ALVEOLOPLASTY  12/28/2016   Extraction of tooth #'s 2- 5,7-10, 12,13,17-20,and 22-29 with alveoloplasty and maxillary right and left lateral exostoses reductions   MULTIPLE EXTRACTIONS WITH ALVEOLOPLASTY N/A 12/28/2016   Procedure: Extraction of tooth #'s 2- 5,7-10, 12,13,17-20,and 22-29 with alveoloplasty and maxillary right and left lateral exostoses  reductions;  Surgeon: Lenn Cal, DDS;  Location: [place];  Service: Oral Surgery;  Laterality: N/A;   PACEMAKER IMPLANT N/A 01/15/2021   Procedure: PACEMAKER IMPLANT;  Surgeon: Constance Haw, MD;  Location: [place] [facility];  Service: Cardiovascular;  Laterality: N/A;   REMOVAL OF STONES  12/28/2021   Procedure: REMOVAL OF STONES;  Surgeon: Carol Ada, MD;  Location: [place];  Service: Gastroenterology;;   RIGHT/LEFT HEART CATH AND CORONARY/GRAFT ANGIOGRAPHY N/A 10/27/2016   Procedure: Right/Left Heart Cath and Coronary/Graft Angiography;  Surgeon: Burnell Blanks, MD;  Location: [place] [facility];  Service: Cardiovascular;  Laterality: N/A;   SAVORY DILATION N/A 03/19/2021   Procedure: SAVORY DILATION;  Surgeon: Carol Ada, MD;  [facility];  Service: Endoscopy;  Laterality: N/A;   SPHINCTEROTOMY  12/28/2021   Procedure: SPHINCTEROTOMY;  Surgeon: Carol Ada, MD;  Location: [place];  Service: Gastroenterology;;   TEE WITHOUT CARDIOVERSION N/A 01/03/2017   Procedure: TRANSESOPHAGEAL ECHOCARDIOGRAM (TEE);  Surgeon: Burnell Blanks, MD;  Location: [place];  Service: Open Heart Surgery;  Laterality: N/A;   TONSILLECTOMY     TRANSCATHETER AORTIC VALVE REPLACEMENT, TRANSFEMORAL N/A 01/03/2017   Procedure: TRANSCATHETER AORTIC VALVE REPLACEMENT, TRANSFEMORAL;  Surgeon: Burnell Blanks, MD;  Location: [place];  Service: Open Heart Surgery;  Laterality: N/A;   TUMOR REMOVAL      Current Outpatient Medications  Medication Sig Dispense Refill   acetaminophen (TYLENOL) 650 MG CR tablet Take 650-1,300 mg by mouth 2 (two) times daily as needed for pain.     apixaban (ELIQUIS) 2.5 MG TABS tablet Take 1 tablet (2.5 mg total) by mouth 2 (two) times daily.     Carboxymethylcellulose Sodium (EYE DROPS OP) Apply 2 drops to eye daily as needed (gritty eyes).     Cholecalciferol (VITAMIN D-3) 25 MCG (1000 UT) CAPS Take 1,000 Units by mouth daily.      docusate sodium (COLACE) 100 MG capsule Take 100 mg by mouth daily.     doxycycline (VIBRA-TABS) 100 MG tablet Take 1 tablet (100 mg total) by mouth 2 (two) times daily. 14 tablet 0   Ensure (ENSURE) Take 237 mLs by mouth every other day.     fluticasone (FLOVENT HFA) 220 MCG/ACT inhaler INHALE 2 PUFFS INTO THE LUNGS TWICE DAILY 12 g 0   LINZESS 72 MCG capsule Take 1 capsule (72 mcg total) by mouth daily. 30 capsule 0   loratadine (CLARITIN) 10 MG tablet Take 1 tablet (10 mg total) by mouth daily. 30 tablet 0   nitroGLYCERIN (NITROSTAT) 0.4 MG SL tablet DISSOLVE 1 TABLET UNDER THE TONGUE EVERY 5 MINUTES AS NEEDED FOR CHEST PAIN 75 tablet 2   OVER THE COUNTER MEDICATION Take by mouth daily as needed (cough). Guaifenesin liquid PO 1 sip by mouth daily as needed for cough     oxyCODONE (OXY IR/ROXICODONE) 5 MG immediate release tablet Take 0.5 tablets (2.5 mg total) by mouth every 12 (twelve) hours as needed  for moderate pain or severe pain. 5 tablet 0   pantoprazole (PROTONIX) 40 MG tablet Take 1 tablet (40 mg total) by mouth 2 (two) times daily. 180 tablet 3   polyethylene glycol powder (MIRALAX) 17 GM/SCOOP powder Take 17 g by mouth 2 (two) times daily as needed for moderate constipation. 255 g 0   rosuvastatin (CRESTOR) 20 MG tablet Take 1 tablet (20 mg total) by mouth daily. 30 tablet 0   VENTOLIN HFA 108 (90 Base) MCG/ACT inhaler INHALE 2 PUFFS INTO THE LUNGS EVERY 6 HOURS AS NEEDED FOR WHEEZING OR SHORTNESS OF BREATH 18 g 0   Wheat Dextrin (BENEFIBER PO) Take 1 Scoop by mouth daily as needed (fiber).     No current facility-administered medications for this encounter.    Allergies  Allergen Reactions   Other Other (See Comments)    NO ACIDIC, TART< OR SPICY FOODS- DEVELOPS REFLUX OFTEN!!  PATIENT HAS TROUBLE SWALLOWING TABLETS!!   Bactrim [Sulfamethoxazole-Trimethoprim] Other (See Comments)    "makes her feel funny" or "unsteady"    Amitriptyline Hcl Rash   Aspirin Hives, Swelling,  Rash and Other (See Comments)    Body became swollen   Tape Other (See Comments)    SKIN IS VERY THIN- WILL TEAR EASILY!!   Zetia [Ezetimibe] Rash    Social History   Socioeconomic History   Marital status: Widowed    Spouse name: Not on file   Number of children: 3   Years of education: Not on file   Highest education level: Not on file  Occupational History    Employer: RETIRED  Tobacco Use   Smoking status: Never   Smokeless tobacco: Former    Types: Snuff    Quit date: 05/23/2001   Tobacco comments:    Former Snuff (02/04/2021)  Vaping Use   Vaping Use: Never used  Substance and Sexual Activity   Alcohol use: No   Drug use: No   Sexual activity: Not Currently  Other Topics Concern   Not on file  Social History Narrative   Not on file   Social Determinants of Health   Financial Resource Strain: Low Risk  (04/09/2021)   Overall Financial Resource Strain (CARDIA)    Difficulty of Paying Living Expenses: Not hard at all  Food Insecurity: No Food Insecurity (04/09/2021)   Hunger Vital Sign    Worried About Running Out of Food in the Last Year: Never true    [place] in the Last Year: Never true  Transportation Needs: No Transportation Needs (12/31/2021)   PRAPARE - Hydrologist (Medical): No    Lack of Transportation (Non-Medical): No  Physical Activity: Insufficiently Active (04/09/2021)   Exercise Vital Sign    Days of Exercise per Week: 2 days    Minutes of Exercise per Session: 10 min  Stress: No Stress Concern Present (04/09/2021)   [place]    Feeling of Stress : Not at all  Social Connections: Not on file  Intimate Partner Violence: Not on file     ROS- All systems are reviewed and negative except as per the HPI above.  Physical Exam: Vitals:   02/02/22 1348  BP: (!) 148/70  Pulse: 92  Weight: 36.5 kg  Height: '5\' 2"'$  (1.575 m)    GEN- The  patient is a cachectic appearing elderly female, alert and oriented x 3 today.   HEENT-head normocephalic, atraumatic, sclera clear, conjunctiva pink,  hearing intact, trachea midline. Lungs- Clear to ausculation bilaterally, normal work of breathing Heart- Regular rate and rhythm, no rubs or gallops, 2/6 systolic murmur  GI- soft, NT, ND, + BS Extremities- no clubbing, cyanosis, or edema MS- no significant deformity or atrophy Skin- no rash or lesion Psych- euthymic mood, full affect Neuro- strength and sensation are intact   Wt Readings from Last 3 Encounters:  02/02/22 36.5 kg  01/26/22 35.8 kg  01/18/22 35.8 kg    EKG today demonstrates  V pacing with underlying afib Vent. rate 92 BPM PR interval * ms QRS duration 148 ms QT/QTcB 430/531 ms  Echo 12/28/21 demonstrated   1. Left ventricular ejection fraction, by estimation, is 40 to 45%. The  left ventricle has mildly decreased function. The left ventricle  demonstrates regional wall motion abnormalities (see scoring  diagram/findings for description). Left ventricular diastolic function could not be evaluated. There is severe hypokinesis of the left ventricular, basal-mid inferolateral wall.   2. Right ventricular systolic function is normal. The right ventricular  size is normal. There is moderately elevated pulmonary artery systolic  pressure. The estimated right ventricular systolic pressure is 42.3 mmHg.   3. Left atrial size was mildly dilated.   4. Right atrial size was mildly dilated.   5. Multiple jets of mitral insufficiency are seen. The dominant jet is  eccentric and posteriorly directed. A centrally directed jet is also seen.  The cumulative degree of regurgitation is probably severe. The mitral  valve is myxomatous. Severe mitral  valve regurgitation. There is holosystolic prolapse of both leaflets of  the mitral valve. The mean mitral valve gradient is 4.0 mmHg.   6. The tricuspid valve is myxomatous. Tricuspid  valve regurgitation is  moderate.   7. The aortic valve has been repaired/replaced. Aortic valve  regurgitation is trivial. There is a 23 mm Sapien prosthetic (TAVR) valve  present in the aortic position. Procedure Date: 01/03/2017. Aortic valve  mean gradient measures 7.7 mmHg. Aortic valve  Vmax measures 1.96 m/s. Aortic valve acceleration time measures 50 msec.   8. Pulmonic valve regurgitation is moderate.   9. The inferior vena cava is dilated in size with <50% respiratory  variability, suggesting right atrial pressure of 15 mmHg.   Comparison(s): The left ventricular function is worsened. The left  ventricular wall motion abnormality is new. Mitral regurgitation is worse  (may have been previously underestimated).  Epic records are reviewed at length today  CHA2DS2-VASc Score = 8  The patient's score is based upon: CHF History: 1 HTN History: 1 Diabetes History: 0 Stroke History: 2 Vascular Disease History: 1 Age Score: 2 Gender Score: 1       ASSESSMENT AND PLAN: 1. Persistent Atrial Fibrillation (ICD10:  I48.19) The patient's CHA2DS2-VASc score is 8, indicating a 10.8% annual risk of stroke.   S/p MAZE 2006 Unclear duration, last ECG in SR 02/11/20. Also unclear how much afib is contributing to her present symptoms. We discussed a trial of SR including AAD + DCCV. She is very frail with malnutrition, the risks may not outweigh benefits of DCCV. Information provided today and questions answered. They plan to discuss rate vs rhythm control again with Dr Curt Bears.  Continue Eliquis 2.5 mg BID  2. Secondary Hypercoagulable State (ICD10:  D68.69) The patient is at significant risk for stroke/thromboembolism based upon her CHA2DS2-VASc Score of 8.  Continue Apixaban (Eliquis).   3. CAD S/p CABG Chronic stable angina.  4. HTN Stable, no changes today.  5.  Valvular heart disease Severe AS, s/p TAVR MVP with severe MR, unlikely surgical candidate.   6. HFrEF EF  40-45% Fluid status appears stable.  7. SOB Likely multifactorial with reduced EF, CAD, severe MR, afib, malnutrition, deconditioning. Plans as above.    Follow up with Dr Curt Bears as scheduled.    [facility] [address] [place], [place] [address] [phone] 02/02/2022 2:06 PM

## 2022-02-03 DIAGNOSIS — K851 Biliary acute pancreatitis without necrosis or infection: Secondary | ICD-10-CM | POA: Diagnosis not present

## 2022-02-03 DIAGNOSIS — K8062 Calculus of gallbladder and bile duct with acute cholecystitis without obstruction: Secondary | ICD-10-CM | POA: Diagnosis not present

## 2022-02-03 DIAGNOSIS — I5032 Chronic diastolic (congestive) heart failure: Secondary | ICD-10-CM | POA: Diagnosis not present

## 2022-02-03 DIAGNOSIS — I110 Hypertensive heart disease with heart failure: Secondary | ICD-10-CM | POA: Diagnosis not present

## 2022-02-03 DIAGNOSIS — I69398 Other sequelae of cerebral infarction: Secondary | ICD-10-CM | POA: Diagnosis not present

## 2022-02-03 DIAGNOSIS — R29898 Other symptoms and signs involving the musculoskeletal system: Secondary | ICD-10-CM | POA: Diagnosis not present

## 2022-02-04 DIAGNOSIS — I69398 Other sequelae of cerebral infarction: Secondary | ICD-10-CM | POA: Diagnosis not present

## 2022-02-04 DIAGNOSIS — Z7901 Long term (current) use of anticoagulants: Secondary | ICD-10-CM | POA: Diagnosis not present

## 2022-02-04 DIAGNOSIS — Z951 Presence of aortocoronary bypass graft: Secondary | ICD-10-CM | POA: Diagnosis not present

## 2022-02-04 DIAGNOSIS — Z7951 Long term (current) use of inhaled steroids: Secondary | ICD-10-CM | POA: Diagnosis not present

## 2022-02-04 DIAGNOSIS — E43 Unspecified severe protein-calorie malnutrition: Secondary | ICD-10-CM | POA: Diagnosis not present

## 2022-02-04 DIAGNOSIS — K8062 Calculus of gallbladder and bile duct with acute cholecystitis without obstruction: Secondary | ICD-10-CM | POA: Diagnosis not present

## 2022-02-04 DIAGNOSIS — M797 Fibromyalgia: Secondary | ICD-10-CM | POA: Diagnosis not present

## 2022-02-04 DIAGNOSIS — I700 Atherosclerosis of aorta: Secondary | ICD-10-CM | POA: Diagnosis not present

## 2022-02-04 DIAGNOSIS — I252 Old myocardial infarction: Secondary | ICD-10-CM | POA: Diagnosis not present

## 2022-02-04 DIAGNOSIS — I088 Other rheumatic multiple valve diseases: Secondary | ICD-10-CM | POA: Diagnosis not present

## 2022-02-04 DIAGNOSIS — I25118 Atherosclerotic heart disease of native coronary artery with other forms of angina pectoris: Secondary | ICD-10-CM | POA: Diagnosis not present

## 2022-02-04 DIAGNOSIS — K851 Biliary acute pancreatitis without necrosis or infection: Secondary | ICD-10-CM | POA: Diagnosis not present

## 2022-02-04 DIAGNOSIS — M1990 Unspecified osteoarthritis, unspecified site: Secondary | ICD-10-CM | POA: Diagnosis not present

## 2022-02-04 DIAGNOSIS — J45909 Unspecified asthma, uncomplicated: Secondary | ICD-10-CM | POA: Diagnosis not present

## 2022-02-04 DIAGNOSIS — M419 Scoliosis, unspecified: Secondary | ICD-10-CM | POA: Diagnosis not present

## 2022-02-04 DIAGNOSIS — I4819 Other persistent atrial fibrillation: Secondary | ICD-10-CM | POA: Diagnosis not present

## 2022-02-04 DIAGNOSIS — R29898 Other symptoms and signs involving the musculoskeletal system: Secondary | ICD-10-CM | POA: Diagnosis not present

## 2022-02-04 DIAGNOSIS — Z950 Presence of cardiac pacemaker: Secondary | ICD-10-CM | POA: Diagnosis not present

## 2022-02-04 DIAGNOSIS — E785 Hyperlipidemia, unspecified: Secondary | ICD-10-CM | POA: Diagnosis not present

## 2022-02-04 DIAGNOSIS — F419 Anxiety disorder, unspecified: Secondary | ICD-10-CM | POA: Diagnosis not present

## 2022-02-04 DIAGNOSIS — I495 Sick sinus syndrome: Secondary | ICD-10-CM | POA: Diagnosis not present

## 2022-02-04 DIAGNOSIS — I5032 Chronic diastolic (congestive) heart failure: Secondary | ICD-10-CM | POA: Diagnosis not present

## 2022-02-04 DIAGNOSIS — K219 Gastro-esophageal reflux disease without esophagitis: Secondary | ICD-10-CM | POA: Diagnosis not present

## 2022-02-04 DIAGNOSIS — I110 Hypertensive heart disease with heart failure: Secondary | ICD-10-CM | POA: Diagnosis not present

## 2022-02-04 DIAGNOSIS — Z952 Presence of prosthetic heart valve: Secondary | ICD-10-CM | POA: Diagnosis not present

## 2022-02-09 DIAGNOSIS — K851 Biliary acute pancreatitis without necrosis or infection: Secondary | ICD-10-CM | POA: Diagnosis not present

## 2022-02-09 DIAGNOSIS — I5032 Chronic diastolic (congestive) heart failure: Secondary | ICD-10-CM | POA: Diagnosis not present

## 2022-02-09 DIAGNOSIS — I69398 Other sequelae of cerebral infarction: Secondary | ICD-10-CM | POA: Diagnosis not present

## 2022-02-09 DIAGNOSIS — I110 Hypertensive heart disease with heart failure: Secondary | ICD-10-CM | POA: Diagnosis not present

## 2022-02-09 DIAGNOSIS — R29898 Other symptoms and signs involving the musculoskeletal system: Secondary | ICD-10-CM | POA: Diagnosis not present

## 2022-02-09 DIAGNOSIS — K8062 Calculus of gallbladder and bile duct with acute cholecystitis without obstruction: Secondary | ICD-10-CM | POA: Diagnosis not present

## 2022-02-11 NOTE — Progress Notes (Signed)
Remote pacemaker transmission.   

## 2022-02-15 ENCOUNTER — Telehealth: Payer: Self-pay | Admitting: Family Medicine

## 2022-02-15 DIAGNOSIS — K8062 Calculus of gallbladder and bile duct with acute cholecystitis without obstruction: Secondary | ICD-10-CM | POA: Diagnosis not present

## 2022-02-15 DIAGNOSIS — I5032 Chronic diastolic (congestive) heart failure: Secondary | ICD-10-CM | POA: Diagnosis not present

## 2022-02-15 DIAGNOSIS — K851 Biliary acute pancreatitis without necrosis or infection: Secondary | ICD-10-CM | POA: Diagnosis not present

## 2022-02-15 DIAGNOSIS — I110 Hypertensive heart disease with heart failure: Secondary | ICD-10-CM | POA: Diagnosis not present

## 2022-02-15 DIAGNOSIS — I69398 Other sequelae of cerebral infarction: Secondary | ICD-10-CM | POA: Diagnosis not present

## 2022-02-15 DIAGNOSIS — R29898 Other symptoms and signs involving the musculoskeletal system: Secondary | ICD-10-CM | POA: Diagnosis not present

## 2022-02-15 NOTE — Telephone Encounter (Signed)
Herbert Deaner with [facility] - physical therapist called and states pt heart rate 98 bpm, ususally upper 70-80.  No symptoms. Also, pt completed the antibiotic about 8 days ago, for her left great toe and it is now mildly worse and the redness is spreading.  Please advise.

## 2022-02-17 ENCOUNTER — Observation Stay (HOSPITAL_COMMUNITY)
Admission: EM | Admit: 2022-02-17 | Discharge: 2022-02-19 | Disposition: A | Discharge status: Home Health | Payer: Medicare Other | Attending: Internal Medicine | Admitting: Internal Medicine

## 2022-02-17 ENCOUNTER — Encounter (HOSPITAL_COMMUNITY): Payer: Self-pay | Admitting: Emergency Medicine

## 2022-02-17 ENCOUNTER — Other Ambulatory Visit: Payer: Self-pay

## 2022-02-17 DIAGNOSIS — Z7902 Long term (current) use of antithrombotics/antiplatelets: Secondary | ICD-10-CM | POA: Diagnosis not present

## 2022-02-17 DIAGNOSIS — Z79899 Other long term (current) drug therapy: Secondary | ICD-10-CM | POA: Insufficient documentation

## 2022-02-17 DIAGNOSIS — Z7189 Other specified counseling: Secondary | ICD-10-CM | POA: Insufficient documentation

## 2022-02-17 DIAGNOSIS — I5022 Chronic systolic (congestive) heart failure: Secondary | ICD-10-CM | POA: Diagnosis present

## 2022-02-17 DIAGNOSIS — Z66 Do not resuscitate: Secondary | ICD-10-CM | POA: Diagnosis not present

## 2022-02-17 DIAGNOSIS — Z951 Presence of aortocoronary bypass graft: Secondary | ICD-10-CM | POA: Diagnosis not present

## 2022-02-17 DIAGNOSIS — Z954 Presence of other heart-valve replacement: Secondary | ICD-10-CM | POA: Insufficient documentation

## 2022-02-17 DIAGNOSIS — Z950 Presence of cardiac pacemaker: Secondary | ICD-10-CM | POA: Insufficient documentation

## 2022-02-17 DIAGNOSIS — R1310 Dysphagia, unspecified: Secondary | ICD-10-CM | POA: Diagnosis not present

## 2022-02-17 DIAGNOSIS — I214 Non-ST elevation (NSTEMI) myocardial infarction: Secondary | ICD-10-CM | POA: Diagnosis not present

## 2022-02-17 DIAGNOSIS — I2510 Atherosclerotic heart disease of native coronary artery without angina pectoris: Secondary | ICD-10-CM | POA: Diagnosis not present

## 2022-02-17 DIAGNOSIS — I5042 Chronic combined systolic (congestive) and diastolic (congestive) heart failure: Secondary | ICD-10-CM | POA: Diagnosis not present

## 2022-02-17 DIAGNOSIS — R457 State of emotional shock and stress, unspecified: Secondary | ICD-10-CM | POA: Diagnosis not present

## 2022-02-17 DIAGNOSIS — J45909 Unspecified asthma, uncomplicated: Secondary | ICD-10-CM | POA: Diagnosis not present

## 2022-02-17 DIAGNOSIS — I480 Paroxysmal atrial fibrillation: Secondary | ICD-10-CM | POA: Diagnosis not present

## 2022-02-17 DIAGNOSIS — Z7901 Long term (current) use of anticoagulants: Secondary | ICD-10-CM | POA: Diagnosis not present

## 2022-02-17 DIAGNOSIS — Z8673 Personal history of transient ischemic attack (TIA), and cerebral infarction without residual deficits: Secondary | ICD-10-CM | POA: Insufficient documentation

## 2022-02-17 DIAGNOSIS — I10 Essential (primary) hypertension: Secondary | ICD-10-CM | POA: Diagnosis not present

## 2022-02-17 DIAGNOSIS — I110 Hypertensive heart disease with heart failure: Secondary | ICD-10-CM | POA: Diagnosis not present

## 2022-02-17 DIAGNOSIS — Z85828 Personal history of other malignant neoplasm of skin: Secondary | ICD-10-CM | POA: Diagnosis not present

## 2022-02-17 DIAGNOSIS — J9601 Acute respiratory failure with hypoxia: Principal | ICD-10-CM | POA: Insufficient documentation

## 2022-02-17 DIAGNOSIS — E785 Hyperlipidemia, unspecified: Secondary | ICD-10-CM | POA: Diagnosis present

## 2022-02-17 DIAGNOSIS — Z87891 Personal history of nicotine dependence: Secondary | ICD-10-CM | POA: Diagnosis not present

## 2022-02-17 DIAGNOSIS — R0600 Dyspnea, unspecified: Secondary | ICD-10-CM | POA: Diagnosis not present

## 2022-02-17 DIAGNOSIS — I25810 Atherosclerosis of coronary artery bypass graft(s) without angina pectoris: Secondary | ICD-10-CM | POA: Diagnosis present

## 2022-02-17 DIAGNOSIS — R0602 Shortness of breath: Secondary | ICD-10-CM | POA: Diagnosis present

## 2022-02-17 DIAGNOSIS — R7989 Other specified abnormal findings of blood chemistry: Secondary | ICD-10-CM | POA: Diagnosis present

## 2022-02-17 DIAGNOSIS — J90 Pleural effusion, not elsewhere classified: Secondary | ICD-10-CM | POA: Diagnosis not present

## 2022-02-17 DIAGNOSIS — R778 Other specified abnormalities of plasma proteins: Secondary | ICD-10-CM | POA: Insufficient documentation

## 2022-02-17 DIAGNOSIS — L089 Local infection of the skin and subcutaneous tissue, unspecified: Secondary | ICD-10-CM

## 2022-02-17 DIAGNOSIS — F419 Anxiety disorder, unspecified: Secondary | ICD-10-CM | POA: Diagnosis not present

## 2022-02-17 DIAGNOSIS — F411 Generalized anxiety disorder: Secondary | ICD-10-CM

## 2022-02-17 DIAGNOSIS — E876 Hypokalemia: Secondary | ICD-10-CM | POA: Diagnosis not present

## 2022-02-17 NOTE — ED Provider Triage Note (Signed)
Emergency Medicine Provider Triage Evaluation Note  [name] , a 88 y.o. female  was evaluated in triage.  Pt complains of SOB that began today.  Denies chest pain.  No cough, fever, sick contacts.  Hx AFIB on eliquis.    Review of Systems  Positive: SOB Negative: fever  Physical Exam  BP 114/88   Pulse (!) 111   Temp 98.3 F (36.8 C)   Resp 18   SpO2 94%   Gen:   Awake, no distress   Resp:  Normal effort  MSK:   Moves extremities without difficulty  Other:    Medical Decision Making  Medically screening exam initiated at 11:49 PM.  Appropriate orders placed.  [name] was informed that the remainder of the evaluation will be completed by another provider, this initial triage assessment does not replace that evaluation, and the importance of remaining in the ED until their evaluation is complete.  SOB.  EKG, labs, CXR.   Larene Pickett, PA-C 02/18/22 0000

## 2022-02-17 NOTE — ED Triage Notes (Signed)
Patient arrived with EMS from home reports SOB today , denies chest pain , no cough or fever .

## 2022-02-17 NOTE — Telephone Encounter (Signed)
Pt daughter was advised of referral to podiatry . [place]

## 2022-02-18 ENCOUNTER — Emergency Department (HOSPITAL_COMMUNITY): Payer: Medicare Other

## 2022-02-18 ENCOUNTER — Encounter (HOSPITAL_COMMUNITY): Payer: Self-pay | Admitting: Internal Medicine

## 2022-02-18 DIAGNOSIS — R0600 Dyspnea, unspecified: Secondary | ICD-10-CM | POA: Diagnosis not present

## 2022-02-18 DIAGNOSIS — R778 Other specified abnormalities of plasma proteins: Secondary | ICD-10-CM | POA: Diagnosis not present

## 2022-02-18 DIAGNOSIS — J90 Pleural effusion, not elsewhere classified: Secondary | ICD-10-CM | POA: Diagnosis not present

## 2022-02-18 DIAGNOSIS — Z952 Presence of prosthetic heart valve: Secondary | ICD-10-CM

## 2022-02-18 DIAGNOSIS — R0602 Shortness of breath: Secondary | ICD-10-CM | POA: Diagnosis not present

## 2022-02-18 DIAGNOSIS — Z950 Presence of cardiac pacemaker: Secondary | ICD-10-CM

## 2022-02-18 DIAGNOSIS — I5022 Chronic systolic (congestive) heart failure: Secondary | ICD-10-CM | POA: Diagnosis present

## 2022-02-18 DIAGNOSIS — I340 Nonrheumatic mitral (valve) insufficiency: Secondary | ICD-10-CM

## 2022-02-18 DIAGNOSIS — J9601 Acute respiratory failure with hypoxia: Secondary | ICD-10-CM | POA: Diagnosis not present

## 2022-02-18 DIAGNOSIS — Z66 Do not resuscitate: Secondary | ICD-10-CM | POA: Diagnosis present

## 2022-02-18 LAB — CBC WITH DIFFERENTIAL/PLATELET
Abs Immature Granulocytes: 0.03 10*3/uL (ref 0.00–0.07)
Basophils Absolute: 0 10*3/uL (ref 0.0–0.1)
Basophils Relative: 1 %
Eosinophils Absolute: 0.1 10*3/uL (ref 0.0–0.5)
Eosinophils Relative: 1 %
HCT: 41.4 % (ref 36.0–46.0)
Hemoglobin: 13.6 g/dL (ref 12.0–15.0)
Immature Granulocytes: 0 %
Lymphocytes Relative: 17 %
Lymphs Abs: 1.3 10*3/uL (ref 0.7–4.0)
MCH: 30.5 pg (ref 26.0–34.0)
MCHC: 32.9 g/dL (ref 30.0–36.0)
MCV: 92.8 fL (ref 80.0–100.0)
Monocytes Absolute: 0.4 10*3/uL (ref 0.1–1.0)
Monocytes Relative: 5 %
Neutro Abs: 6 10*3/uL (ref 1.7–7.7)
Neutrophils Relative %: 76 %
Platelets: 166 10*3/uL (ref 150–400)
RBC: 4.46 MIL/uL (ref 3.87–5.11)
RDW: 13.2 % (ref 11.5–15.5)
WBC: 7.9 10*3/uL (ref 4.0–10.5)
nRBC: 0 % (ref 0.0–0.2)

## 2022-02-18 LAB — COMPREHENSIVE METABOLIC PANEL
ALT: 14 U/L (ref 0–44)
AST: 23 U/L (ref 15–41)
Albumin: 4 g/dL (ref 3.5–5.0)
Alkaline Phosphatase: 36 U/L — ABNORMAL LOW (ref 38–126)
Anion gap: 9 (ref 5–15)
BUN: 7 mg/dL — ABNORMAL LOW (ref 8–23)
CO2: 28 mmol/L (ref 22–32)
Calcium: 9.4 mg/dL (ref 8.9–10.3)
Chloride: 101 mmol/L (ref 98–111)
Creatinine, Ser: 0.54 mg/dL (ref 0.44–1.00)
GFR, Estimated: >60 mL/min (ref >60)
Glucose, Bld: 118 mg/dL — ABNORMAL HIGH (ref 70–99)
Potassium: 3.4 mmol/L — ABNORMAL LOW (ref 3.5–5.1)
Sodium: 138 mmol/L (ref 135–145)
Total Bilirubin: 0.5 mg/dL (ref 0.3–1.2)
Total Protein: 6.6 g/dL (ref 6.5–8.1)

## 2022-02-18 LAB — TROPONIN I (HIGH SENSITIVITY)
Troponin I (High Sensitivity): 46 ng/L — ABNORMAL HIGH (ref <18)
Troponin I (High Sensitivity): 170 ng/L (ref <18)
Troponin I (High Sensitivity): 247 ng/L (ref <18)
Troponin I (High Sensitivity): 194 ng/L (ref <18)

## 2022-02-18 LAB — BRAIN NATRIURETIC PEPTIDE: B Natriuretic Peptide: 185.7 pg/mL — ABNORMAL HIGH (ref 0.0–100.0)

## 2022-02-18 MED ORDER — POLYETHYLENE GLYCOL 3350 17 G PO PACK: 17.0000 g | PACK | Freq: Every day | ORAL | Status: DC | PRN

## 2022-02-18 MED ORDER — BUDESONIDE 0.25 MG/2ML IN SUSP
0.2500 mg | Freq: Two times a day (BID) | RESPIRATORY_TRACT | Status: DC
  Administered 2022-02-18 (×2): 0.25 mg via RESPIRATORY_TRACT
  Filled 2022-02-18 (×3): qty 2

## 2022-02-18 MED ORDER — PANTOPRAZOLE SODIUM 40 MG PO TBEC
40.0000 mg | DELAYED_RELEASE_TABLET | Freq: Two times a day (BID) | ORAL | Status: DC
  Administered 2022-02-18 – 2022-02-19 (×3): 40 mg via ORAL
  Filled 2022-02-18 (×3): qty 1

## 2022-02-18 MED ORDER — FLUTICASONE PROPIONATE HFA 220 MCG/ACT IN AERO: 2.0000 | INHALATION_SPRAY | Freq: Two times a day (BID) | RESPIRATORY_TRACT | Status: DC

## 2022-02-18 MED ORDER — LORATADINE 10 MG PO TABS
10.0000 mg | ORAL_TABLET | Freq: Every day | ORAL | Status: DC
  Administered 2022-02-18 – 2022-02-19 (×2): 10 mg via ORAL
  Filled 2022-02-18 (×2): qty 1

## 2022-02-18 MED ORDER — BISACODYL 5 MG PO TBEC: 5.0000 mg | DELAYED_RELEASE_TABLET | Freq: Every day | ORAL | Status: DC | PRN

## 2022-02-18 MED ORDER — ALBUTEROL SULFATE (2.5 MG/3ML) 0.083% IN NEBU
2.5000 mg | INHALATION_SOLUTION | Freq: Four times a day (QID) | RESPIRATORY_TRACT | Status: DC | PRN
  Filled 2022-02-18: qty 3

## 2022-02-18 MED ORDER — LORAZEPAM 1 MG PO TABS
0.5000 mg | ORAL_TABLET | Freq: Once | ORAL | Status: AC
  Administered 2022-02-18: 0.5 mg via ORAL
  Filled 2022-02-18: qty 1

## 2022-02-18 MED ORDER — ACETAMINOPHEN 325 MG PO TABS
650.0000 mg | ORAL_TABLET | Freq: Four times a day (QID) | ORAL | Status: DC | PRN
  Administered 2022-02-19: 325 mg via ORAL
  Filled 2022-02-18: qty 2

## 2022-02-18 MED ORDER — ACETAMINOPHEN 650 MG RE SUPP: 650.0000 mg | Freq: Four times a day (QID) | RECTAL | Status: DC | PRN

## 2022-02-18 MED ORDER — SODIUM CHLORIDE 0.9% FLUSH
3.0000 mL | Freq: Two times a day (BID) | INTRAVENOUS | Status: DC
  Administered 2022-02-18 – 2022-02-19 (×3): 3 mL via INTRAVENOUS

## 2022-02-18 MED ORDER — HYDRALAZINE HCL 20 MG/ML IJ SOLN: 5.0000 mg | INTRAMUSCULAR | Status: DC | PRN

## 2022-02-18 MED ORDER — ROSUVASTATIN CALCIUM 20 MG PO TABS
20.0000 mg | ORAL_TABLET | Freq: Every day | ORAL | Status: DC
  Administered 2022-02-18 – 2022-02-19 (×2): 20 mg via ORAL
  Filled 2022-02-18 (×2): qty 1

## 2022-02-18 MED ORDER — LINACLOTIDE 72 MCG PO CAPS
72.0000 ug | ORAL_CAPSULE | Freq: Every day | ORAL | Status: DC
  Administered 2022-02-18 – 2022-02-19 (×2): 72 ug via ORAL
  Filled 2022-02-18 (×3): qty 1

## 2022-02-18 MED ORDER — ONDANSETRON HCL 4 MG PO TABS: 4.0000 mg | ORAL_TABLET | Freq: Four times a day (QID) | ORAL | Status: DC | PRN

## 2022-02-18 MED ORDER — APIXABAN 2.5 MG PO TABS
2.5000 mg | ORAL_TABLET | Freq: Two times a day (BID) | ORAL | Status: DC
  Administered 2022-02-18 – 2022-02-19 (×3): 2.5 mg via ORAL
  Filled 2022-02-18 (×3): qty 1

## 2022-02-18 MED ORDER — ONDANSETRON HCL 4 MG/2ML IJ SOLN: 4.0000 mg | Freq: Four times a day (QID) | INTRAMUSCULAR | Status: DC | PRN

## 2022-02-18 MED ORDER — DOCUSATE SODIUM 100 MG PO CAPS
100.0000 mg | ORAL_CAPSULE | Freq: Two times a day (BID) | ORAL | Status: DC
  Administered 2022-02-18 – 2022-02-19 (×3): 100 mg via ORAL
  Filled 2022-02-18 (×3): qty 1

## 2022-02-18 NOTE — Evaluation (Signed)
Clinical/Bedside Swallow Evaluation Patient Details  Name: GENEVIEVE ARBAUGH MRN: [id number] Date of Birth: 10-19-1928  Today's Date: 02/18/2022 Time: SLP Start Time (ACUTE ONLY): 54 SLP Stop Time (ACUTE ONLY): 1254 SLP Time Calculation (min) (ACUTE ONLY): 7 min  Past Medical History:  Past Medical History:  Diagnosis Date   Anxiety    Aortic Stenosis s/p TAVR    Echo 10/21: EF 55-60, no RWMA, mild asymmetric LVH, normal RVSF, RVSP 40.2 mmHg, mild MR, s/p TAVR with mean gradient 8.1mHg, no PVL   Arthritis    OSTEO   Aspirin allergy    on Plavix   Asthma    CAD (coronary artery disease)    a. s/p CABG 2006 with Cox-Maze procedure.   Carotid artery disease ([place]    a. s/p R CEA.   Diastolic dysfunction    Eczema    Fibromyalgia    GERD (gastroesophageal reflux disease)    Hiatal hernia    Hyperlipidemia    Hypertension    Kyphoscoliosis    Mitral regurgitation    PAF (paroxysmal atrial fibrillation) ([place]    a. pt has h/o hematuria on Eliquis and has since refused anticoagulation   Pneumonia 2015 ?   Pulmonary regurgitation    Skin cancer of arm    [facility]    Tricuspid regurgitation    Past Surgical History:  Past Surgical History:  Procedure Laterality Date   ABDOMINAL HYSTERECTOMY  1976   CAROTID ENDARTERECTOMY  2010   CORONARY ANGIOPLASTY WITH STENT PLACEMENT     CORONARY ARTERY BYPASS GRAFT  01/2005   LIMA-D1; SVG-LAD; SVG-OM; SVG-PDA   ERCP N/A 12/28/2021   Procedure: ENDOSCOPIC RETROGRADE CHOLANGIOPANCREATOGRAPHY (ERCP);  Surgeon: HCarol Ada MD;  [place]  Service: Gastroenterology;  Laterality: N/A;   ESOPHAGOGASTRODUODENOSCOPY (EGD) WITH PROPOFOL N/A 03/19/2021   Procedure: ESOPHAGOGASTRODUODENOSCOPY (EGD) WITH PROPOFOL;  Surgeon: HCarol Ada MD;  [facility];  Service: Endoscopy;  Laterality: N/A;   EYE SURGERY     MULTIPLE EXTRACTIONS WITH ALVEOLOPLASTY  12/28/2016   Extraction of tooth #'s 2- 5,7-10, 12,13,17-20,and  22-29 with alveoloplasty and maxillary right and left lateral exostoses reductions   MULTIPLE EXTRACTIONS WITH ALVEOLOPLASTY N/A 12/28/2016   Procedure: Extraction of tooth #'s 2- 5,7-10, 12,13,17-20,and 22-29 with alveoloplasty and maxillary right and left lateral exostoses reductions;  Surgeon: KLenn Cal DDS;  [place]  Service: Oral Surgery;  Laterality: N/A;   PACEMAKER IMPLANT N/A 01/15/2021   Procedure: PACEMAKER IMPLANT;  Surgeon: CConstance Haw MD;  [place];  Service: Cardiovascular;  Laterality: N/A;   REMOVAL OF STONES  12/28/2021   Procedure: REMOVAL OF STONES;  Surgeon: HCarol Ada MD;  [place]  Service: Gastroenterology;;   RIGHT/LEFT HEART CATH AND CORONARY/GRAFT ANGIOGRAPHY N/A 10/27/2016   Procedure: Right/Left Heart Cath and Coronary/Graft Angiography;  Surgeon: MBurnell Blanks MD;  [place];  Service: Cardiovascular;  Laterality: N/A;   SAVORY DILATION N/A 03/19/2021   Procedure: SAVORY DILATION;  Surgeon: HCarol Ada MD;  [facility];  Service: Endoscopy;  Laterality: N/A;   SPHINCTEROTOMY  12/28/2021   Procedure: SPHINCTEROTOMY;  Surgeon: HCarol Ada MD;  [place]  Service: Gastroenterology;;   TEE WITHOUT CARDIOVERSION N/A 01/03/2017   Procedure: TRANSESOPHAGEAL ECHOCARDIOGRAM (TEE);  Surgeon: MBurnell Blanks MD;  [place]  Service: Open Heart Surgery;  Laterality: N/A;   TONSILLECTOMY     TRANSCATHETER AORTIC VALVE REPLACEMENT, TRANSFEMORAL N/A 01/03/2017  Procedure: TRANSCATHETER AORTIC VALVE REPLACEMENT, TRANSFEMORAL;  Surgeon: Burnell Blanks, MD;  Location: [place];  Service: Open Heart Surgery;  Laterality: N/A;   TUMOR REMOVAL     HPI:  CINDA HARA is a 88 y.o. female who presented with SOB.  Her daughter reports SOB.  She has had that for some time  She was sitting in front of the window [facility] unit trying to get air but she got  more and more SOB.  No CP.  She is not normally on O2.  She has chronic bronchitis and asthma.  She does get a little choked sometimes with eating/drinking.  Her daughter doesn't think that happened yesterday but she was unable to swallow some of her chicken and spit it out. Pt with medical history significant of CAD s/p CABG; pacemaker placement; chronic diastolic CHF; HTN; HLD; and afib on Eliquis    Assessment / Plan / Recommendation  Clinical Impression  Pt presents with grossly normal swallow function as assessed clinically.  Pt and family report that pt gets strangled with food some times.  Pt voiced this specifically related to solid graham cracker during today's assessment.  Despite this, there were no clinical s/s of aspiration noted with any consistencies today including graham cracker.  Daughter denies hx pna.  Pt's chest imaging is clear.    Recommend regular texture diet with thin liquid, avoiding foods which are known to be difficult.  SLP Visit Diagnosis: Dysphagia, unspecified (R13.10)    Aspiration Risk  Mild aspiration risk    Diet Recommendation Regular;Thin liquid   Liquid Administration via: Cup;Straw Medication Administration: Whole meds with liquid Compensations: Slow rate;Small sips/bites Postural Changes: Seated upright at 90 degrees    Other  Recommendations Oral Care Recommendations: Oral care BID    Recommendations for follow up therapy are one component of a multi-disciplinary discharge planning process, led by the attending physician.  Recommendations may be updated based on patient status, additional functional criteria and insurance authorization.  Follow up Recommendations No SLP follow up      Assistance Recommended at Discharge None  Functional Status Assessment Patient has not had a recent decline in their functional status  Frequency and Duration  (N/A)          Prognosis Prognosis for Safe Diet Advancement:  (N/A)      Swallow Study    General Date of Onset: 02/18/22 HPI: TAYGAN CONNELL is a 88 y.o. female who presented with SOB.  Her daughter reports SOB.  She has had that for some time  She was sitting in front of the window [facility] unit trying to get air but she got more and more SOB.  No CP.  She is not normally on O2.  She has chronic bronchitis and asthma.  She does get a little choked sometimes with eating/drinking.  Her daughter doesn't think that happened yesterday but she was unable to swallow some of her chicken and spit it out. Pt with medical history significant of CAD s/p CABG; pacemaker placement; chronic diastolic CHF; HTN; HLD; and afib on Eliquis Type of Study: Bedside Swallow Evaluation Previous Swallow Assessment: none Diet Prior to this Study: NPO Respiratory Status: Nasal cannula History of Recent Intubation: No Behavior/Cognition: Alert;Cooperative;Pleasant mood Oral Cavity Assessment: Within Functional Limits Oral Care Completed by SLP: No Oral Cavity - Dentition: Dentures, top;Adequate natural dentition Vision: Functional for self-feeding Patient Positioning: Upright in bed Baseline Vocal Quality: Normal    Oral/Motor/Sensory Function Overall Oral Motor/Sensory Function: Mild  impairment Facial ROM: Within Functional Limits Facial Symmetry: Within Functional Limits Lingual ROM: Reduced right;Reduced left Lingual Symmetry: Within Functional Limits Lingual Strength: Reduced Velum: Within Functional Limits Mandible: Within Functional Limits   Ice Chips Ice chips: Not tested   Thin Liquid Thin Liquid: Within functional limits Presentation: Straw    Nectar Thick Nectar Thick Liquid: Not tested   Honey Thick Honey Thick Liquid: Not tested   Puree Puree: Within functional limits Presentation: Spoon   Solid     Solid: Within functional limits Presentation:  (SLP fed)      Celedonio Savage, [place], [place] Office: [phone] 02/18/2022,1:18 PM

## 2022-02-18 NOTE — ED Provider Notes (Signed)
[facility] EMERGENCY DEPARTMENT Provider Note   CSN: [id number] Arrival date & time: 02/17/22  2341     History  Chief Complaint  Patient presents with   Shortness of [place] is a 88 y.o. female.  The history is provided by the patient and a relative.  Shortness of Breath Associated symptoms: cough   Associated symptoms: no chest pain and no fever    Patient presents for shortness of breath.  Patient has multiple comorbidities and daughter reports patient has frequent episodes of shortness of breath.  She reports this is been ongoing for months.  Tonight patient appeared to be gasping for breath more than usual.  There is no chest pain.  No fevers.  She has a chronic nonproductive cough that is unchanged. Patient has been medication compliant Daughter reports patient appears anxious at home.  Patient is sedentary at baseline, so is unclear if it is worse with exertion No lower extremity edema is reported   Patient recently admitted for cholecystitis/choledocholithiasis and underwent ERCP with removal No new abdominal pain Home Medications Prior to Admission medications   Medication Sig Start Date End Date Taking? Authorizing Provider  acetaminophen (TYLENOL) 650 MG CR tablet Take 650-1,300 mg by mouth 2 (two) times daily as needed for pain.    [provider]  apixaban (ELIQUIS) 2.5 MG TABS tablet Take 1 tablet (2.5 mg total) by mouth 2 (two) times daily. 01/03/22   Lavina Hamman, MD  Carboxymethylcellulose Sodium (EYE DROPS OP) Apply 2 drops to eye daily as needed (gritty eyes).    [provider]  Cholecalciferol (VITAMIN D-3) 25 MCG (1000 UT) CAPS Take 1,000 Units by mouth daily.    [provider]  docusate sodium (COLACE) 100 MG capsule Take 100 mg by mouth daily.    [provider]  doxycycline (VIBRA-TABS) 100 MG tablet Take 1 tablet (100 mg total) by mouth 2 (two) times daily. 01/31/22   Denita Lung, MD  Ensure (ENSURE) Take 237 mLs by mouth every other day.    [provider]  fluticasone (FLOVENT HFA) 220 MCG/ACT inhaler INHALE 2 PUFFS INTO THE LUNGS TWICE DAILY 07/14/21   Denita Lung, MD  LINZESS 72 MCG capsule Take 1 capsule (72 mcg total) by mouth daily. 02/10/21   Medina-Vargas, Monina C, NP  loratadine (CLARITIN) 10 MG tablet Take 1 tablet (10 mg total) by mouth daily. 02/10/21   Medina-Vargas, Monina C, NP  nitroGLYCERIN (NITROSTAT) 0.4 MG SL tablet DISSOLVE 1 TABLET UNDER THE TONGUE EVERY 5 MINUTES AS NEEDED FOR CHEST PAIN 11/01/21   Burnell Blanks, MD  OVER THE COUNTER MEDICATION Take by mouth daily as needed (cough). Guaifenesin liquid PO 1 sip by mouth daily as needed for cough    [provider]  oxyCODONE (OXY IR/ROXICODONE) 5 MG immediate release tablet Take 0.5 tablets (2.5 mg total) by mouth every 12 (twelve) hours as needed for moderate pain or severe pain. 12/30/21   Lavina Hamman, MD  pantoprazole (PROTONIX) 40 MG tablet Take 1 tablet (40 mg total) by mouth 2 (two) times daily. 01/26/22   Denita Lung, MD  polyethylene glycol powder (MIRALAX) 17 GM/SCOOP powder Take 17 g by mouth 2 (two) times daily as needed for moderate constipation. 02/10/21   Medina-Vargas, Monina C, NP  rosuvastatin (CRESTOR) 20 MG tablet Take 1 tablet (20 mg total) by mouth daily. 01/26/22   Denita Lung, MD  Enid Cutter  HFA 108 (90 Base) MCG/ACT inhaler INHALE 2 PUFFS INTO THE LUNGS EVERY 6 HOURS AS NEEDED FOR WHEEZING OR SHORTNESS OF BREATH 07/15/21   Denita Lung, MD  Wheat Dextrin (BENEFIBER PO) Take 1 Scoop by mouth daily as needed (fiber).    [provider]      Allergies    Other, Bactrim [sulfamethoxazole-trimethoprim], Amitriptyline hcl, Aspirin, Tape, and Zetia [ezetimibe]    Review of Systems   Review of Systems  Constitutional:  Negative for fever.  Respiratory:  Positive for cough and shortness of breath.   Cardiovascular:  Negative for  chest pain and leg swelling.  Psychiatric/Behavioral:  The patient is nervous/anxious.     Physical Exam Updated Vital Signs BP 113/74   Pulse (!) 109   Temp 98.1 F (36.7 C) (Oral)   Resp (!) 25   SpO2 97%  Physical Exam CONSTITUTIONAL: Elderly, frail, cachectic, anxious HEAD: Normocephalic/atraumatic EYES: EOMI/PERRL ENMT: Mucous membranes moist NECK: supple no meningeal signs SPINE/BACK:entire spine nontender CV: S1/S2 noted, tachycardic LUNGS: Lungs are clear to auscultation bilaterally, tachypneic ABDOMEN: soft, nontender NEURO: Pt is awake/alert, moves all extremitiesx4.  No facial droop.  Patient with stuttering speech when she becomes very anxious EXTREMITIES: pulses normal/equal, full ROM, no lower extremity edema SKIN: warm, color normal PSYCH: Anxious  ED Results / Procedures / Treatments   Labs (all labs ordered are listed, but only abnormal results are displayed) Labs Reviewed  COMPREHENSIVE METABOLIC PANEL - Abnormal; Notable for the following components:      Result Value   Potassium 3.4 (*)    Glucose, Bld 118 (*)    BUN 7 (*)    Alkaline Phosphatase 36 (*)    All other components within normal limits  BRAIN NATRIURETIC PEPTIDE - Abnormal; Notable for the following components:   B Natriuretic Peptide 185.7 (*)    All other components within normal limits  TROPONIN I (HIGH SENSITIVITY) - Abnormal; Notable for the following components:   Troponin I (High Sensitivity) 46 (*)    All other components within normal limits  TROPONIN I (HIGH SENSITIVITY) - Abnormal; Notable for the following components:   Troponin I (High Sensitivity) 170 (*)    All other components within normal limits  TROPONIN I (HIGH SENSITIVITY) - Abnormal; Notable for the following components:   Troponin I (High Sensitivity) 247 (*)    All other components within normal limits  CBC WITH DIFFERENTIAL/PLATELET  TROPONIN I (HIGH SENSITIVITY)    EKG EKG  Interpretation  Date/Time:  Thursday February 17 2022 23:51:11 EDT Ventricular Rate:  105 PR Interval:    QRS Duration: 148 QT Interval:  424 QTC Calculation: 560 R Axis:   -88 Text Interpretation: Ventricular-paced rhythm Abnormal ECG No significant change since last tracing Confirmed by Ripley Fraise [phone]) on 02/18/2022 4:03:13 AM  Radiology DG Chest 2 View  Result Date: 02/18/2022 CLINICAL DATA:  Dyspnea EXAM: CHEST - 2 VIEW COMPARISON:  Radiograph 12/25/2021 FINDINGS: Hyperinflation. No focal consolidation, pleural effusion, or pneumothorax. No acute osseous abnormality. Small bilateral pleural effusions or pleural thickening. Stable cardiomediastinal silhouette. Aortic atherosclerotic calcification. Sternotomy. TAVR. Left chest wall pacemaker. No acute osseous abnormality. IMPRESSION: No active cardiopulmonary disease. Electronically Signed   By: Placido Sou M.D.   On: 02/18/2022 00:37    Procedures .Critical Care  Performed by: Ripley Fraise, MD Authorized by: Ripley Fraise, MD   Critical care provider statement:    Critical care time (minutes):  40   Critical care start time:  02/18/2022  5:50 AM   Critical care end time:  02/18/2022 6:30 AM   Critical care time was exclusive of:  Separately billable procedures and treating other patients   Critical care was necessary to treat or prevent imminent or life-threatening deterioration of the following conditions:  Cardiac failure   Critical care was time spent personally by me on the following activities:  Discussions with consultants, examination of patient, evaluation of patient's response to treatment, ordering and review of radiographic studies, ordering and review of laboratory studies, ordering and performing treatments and interventions, pulse oximetry and re-evaluation of patient's condition   I assumed direction of critical care for this patient from another provider in my specialty: no     Care discussed with:  admitting provider       Medications Ordered in ED Medications  apixaban (ELIQUIS) tablet 2.5 mg (has no administration in time range)  rosuvastatin (CRESTOR) tablet 20 mg (has no administration in time range)  LORazepam (ATIVAN) tablet 0.5 mg (0.5 mg Oral Given 02/18/22 0449)    ED Course/ Medical Decision Making/ A&P Clinical Course as of 02/18/22 0633  Fri Feb 18, 2022  0444 Patient presents with shortness of breath.  This is likely multifactorial.  Daughter reports has been intermittent for months.  Patient has multiple comorbidities including pulmonary hypertension, CHF, atrial fibrillation, CAD and previous history of aortic stenosis s/p TAVR.  All these could be contributing to her symptoms as well as anxiety.  Patient appears significantly anxious on my exam and is stuttering her speech with rapid speech [DW]  [phone] Daughter request medications for anxiety.  She reports several years ago she did tolerate small dose of Valium.  We will give small dose of Ativan here.  Patient does have an elevated troponin though with this is been seen previously.  We will check a third troponin.  Of note patient is a DNR [DW]  [phone] Unfortunately troponins have started to trend upward.  Patient has never had any chest pain.  EKG reveals a ventricular paced rhythm with underlying atrial fibrillation.  Her heart rate has been in the 90s to 100s.  She appeared severely anxious, but did improve with Ativan. [DW]  0630 Her extensive cardiac comorbidities and her age, patient will be admitted for further monitoring.  I did consult cardiology fellow Dr. Harrell Gave, who will inform the day cardiology team to see the patient, but she would benefit from hospitalist admission [DW]  0631 Discussed with Dr. Marlowe Sax for admission [DW]  [phone] On review of records, patient is not on any rate control agents likely due to previous history of bradycardia that required pacemaker.  Her heart rate is not typically in the  100s which could be contributing to the troponin leak [DW]  0631 Patient is already on anticoagulation, less suspicion for PE [DW]    Clinical Course User Index [DW] Ripley Fraise, MD                           Medical Decision Making Risk Prescription drug management. Decision regarding hospitalization.   This patient presents to the ED for concern of shortness of breath, this involves an extensive number of treatment options, and is a complaint that carries with it a high risk of complications and morbidity.  The differential diagnosis includes but is not limited to Acute coronary syndrome, pneumonia, acute pulmonary edema, pneumothorax, acute anemia, pulmonary embolism, anxiety  Comorbidities that complicate the patient evaluation: Patient's presentation is  complicated by their history of CAD, CHF, pulmonary hypertension, atrial fibrillation  Social Determinants of Health: Patient's  impaired mobility and limited finances   increases the complexity of managing their presentation  Additional history obtained: Additional history obtained from family Records reviewed previous admission documents  Lab Tests: I Ordered, and personally interpreted labs.  The pertinent results include: Elevated troponin  Imaging Studies ordered: I ordered imaging studies including X-ray chest   I independently visualized and interpreted imaging which showed no acute findings I agree with the radiologist interpretation  Cardiac Monitoring: The patient was maintained on a cardiac monitor.  I personally viewed and interpreted the cardiac monitor which showed an underlying rhythm of: Paced rhythm  Medicines ordered and prescription drug management: I ordered medication including Ativan for anxiety Reevaluation of the patient after these medicines showed that the patient    improved   Critical Interventions:  admission for non-stemi  Consultations Obtained: I requested consultation with the  admitting physician [facility] hospitalist and consultant cardiology , and discussed  findings as well as pertinent plan - they recommend: admit to medical service  Reevaluation: After the interventions noted above, I reevaluated the patient and found that they have :improved  Complexity of problems addressed: Patient's presentation is most consistent with  acute presentation with potential threat to life or bodily function  Disposition: After consideration of the diagnostic results and the patient's response to treatment,  I feel that the patent would benefit from admission   .           Final Clinical Impression(s) / ED Diagnoses Final diagnoses:  NSTEMI (non-ST elevated myocardial infarction) [facility])  Anxiety state    Rx / DC Orders ED Discharge Orders     None         Ripley Fraise, MD 02/18/22 [phone]

## 2022-02-18 NOTE — Consult Note (Addendum)
Cardiology Consultation   Patient ID: [name] MRN: [id number]; DOB: 1929/01/27  Admit date: 02/17/2022 Date of Consult: 02/18/2022  PCP:  Lori Lung, MD   [place] [facility] Cardiologist:  Lori Chandler, MD  Electrophysiologist:  Lori Meredith Leeds, MD  {   Patient Profile:   [name] is a 88 y.o. female with a hx of CAD s/p CABG, paroxysmal atrial fibrillation s/p MAZE, CAS s/p CEA, fibromyalgia, GERD, moderate MR, severe AS s/p TAVR, sss s/p PPM, chronic diastolic Chf, HTN, HLD, asthma who is being seen 02/18/2022 for the evaluation of chest pain, elevated troponin at the request of [name] .  History of Present Illness:   [name] is a 88 year old female with above medical history who is followed by Dr. Angelena [name], Dr. Curt [name].  Per chart review, patient has a long history of CAD and atrial fibrillation.  Underwent CABGx4 with LIMA-D1, SVG-LAD, SVG-OM, SVG-PDA in 01/2005.  Also underwent Maze procedure at that time.  Later had a carotid endarterectomy in 2010  Later, in 10/2016, echocardiogram showed that patient had progression of aortic stenosis.  For further evaluation, patient had R/L heart catheterization that showed severe triple-vessel CAD s/p 4V CABG.  3/4 bypass grafts were patent, LIMA to diagonal was atretic.  Also showed severe aortic valve stenosis.  Patient was referred to the TAVR team, underwent TAVR on 01/03/2017.  Follow-up echocardiogram after TAVR on 02/06/2017 showed EF 60-65%, no regional wall motion abnormalities, TAVR prosthetic valve present without regurgitation, mild MR, moderate TR.  Following TAVR, patient had developed a LBBB.  Wore a 48-hour Holter monitor on 02/10/2017 that showed episodes of sinus bradycardia, transient heart block.  Patient was referred to EP, stopped diltiazem.   Patient was admitted to the hospital in 12/2020 after patient had a syncopal episode.  Patient noted to have having early satiety, weight loss,  deconditioning, failure to thrive.  Despite these issues, patient continued to be full code.  Noted that she had multiple 6+ second pauses on telemetry while in atrial fibrillation.  Underwent echocardiogram on 01/15/2021 that showed EF 55-60%, mild LVH, mildly reduced RV systolic function, mild-moderate MR, mild-moderate TR, normally functioning bioprosthetic AVR.  Underwent pacemaker implantation on 01/15/2021 with [facility] pacemaker.  Patient was seen by cardiology on 12/27/2021 for preoperative evaluation prior to intervention for gallstone pancreatitis.  Echocardiogram on 12/28/2021 showed EF 40-45% with wall motion abnormalities, normal RV systolic function, moderately elevated pulmonary artery systolic pressure, severe mitral valve regurgitation, moderate tricuspid valve regurgitation, moderate pulm Onyx valve regurgitation, aortic valve s/p prosthetic TAVR valve repair with trivial MR. Due to patient's age, frailty, she was not a good cath candidate and we managed medically.  Patient presented to the ER on 9/28 complaining of shortness of breath.  Denies having chest pain. Labs in the ED showed Na 138, K 3.4, creatinine 0.54, wbc 7.9, hemoglobin 13.6, platelets 166. hsTn 46>>170>>247. BNP 185.7.   CXR showed no active cardiopulmonary disease. EKG showed sinus rhythm with LBBB.   Past Medical History:  Diagnosis Date   Anxiety    Aortic Stenosis s/p TAVR    Echo 10/21: EF 55-60, no RWMA, mild asymmetric LVH, normal RVSF, RVSP 40.2 mmHg, mild MR, s/p TAVR with mean gradient 8.37mHg, no PVL   Arthritis    OSTEO   Aspirin allergy    on Plavix   Asthma    CAD (coronary artery disease)    a. s/p CABG 2006 with Cox-Maze procedure.  Carotid artery disease ([place])    a. s/p R CEA.   Diastolic dysfunction    Eczema    Fibromyalgia    GERD (gastroesophageal reflux disease)    Hiatal hernia    Hyperlipidemia    Hypertension    Kyphoscoliosis    Mitral regurgitation    PAF (paroxysmal atrial  fibrillation) ([place])    a. pt has h/o hematuria on Eliquis and has since refused anticoagulation   Pneumonia 2015 ?   Pulmonary regurgitation    Skin cancer of arm    Stroke [facility])    Tricuspid regurgitation     Past Surgical History:  Procedure Laterality Date   ABDOMINAL HYSTERECTOMY  1976   CAROTID ENDARTERECTOMY  2010   CORONARY ANGIOPLASTY WITH STENT PLACEMENT     CORONARY ARTERY BYPASS GRAFT  01/2005   LIMA-D1; SVG-LAD; SVG-OM; SVG-PDA   ERCP N/A 12/28/2021   Procedure: ENDOSCOPIC RETROGRADE CHOLANGIOPANCREATOGRAPHY (ERCP);  Surgeon: Lori Ada, MD;  Location: [place];  Service: Gastroenterology;  Laterality: N/A;   ESOPHAGOGASTRODUODENOSCOPY (EGD) WITH PROPOFOL N/A 03/19/2021   Procedure: ESOPHAGOGASTRODUODENOSCOPY (EGD) WITH PROPOFOL;  Surgeon: Lori Ada, MD;  Location: [facility];  Service: Endoscopy;  Laterality: N/A;   EYE SURGERY     MULTIPLE EXTRACTIONS WITH ALVEOLOPLASTY  12/28/2016   Extraction of tooth #'s 2- 5,7-10, 12,13,17-20,and 22-29 with alveoloplasty and maxillary right and left lateral exostoses reductions   MULTIPLE EXTRACTIONS WITH ALVEOLOPLASTY N/A 12/28/2016   Procedure: Extraction of tooth #'s 2- 5,7-10, 12,13,17-20,and 22-29 with alveoloplasty and maxillary right and left lateral exostoses reductions;  Surgeon: [name], DDS;  Location: [place];  Service: Oral Surgery;  Laterality: N/A;   PACEMAKER IMPLANT N/A 01/15/2021   Procedure: PACEMAKER IMPLANT;  Surgeon: Lori Haw, MD;  Location: [place] [facility];  Service: Cardiovascular;  Laterality: N/A;   REMOVAL OF STONES  12/28/2021   Procedure: REMOVAL OF STONES;  Surgeon: Lori Ada, MD;  Location: [place];  Service: Gastroenterology;;   RIGHT/LEFT HEART CATH AND CORONARY/GRAFT ANGIOGRAPHY N/A 10/27/2016   Procedure: Right/Left Heart Cath and Coronary/Graft Angiography;  Surgeon: Lori Blanks, MD;  Location: [place] [facility];  Service: Cardiovascular;  Laterality:  N/A;   SAVORY DILATION N/A 03/19/2021   Procedure: SAVORY DILATION;  Surgeon: Lori Ada, MD;  [facility];  Service: Endoscopy;  Laterality: N/A;   SPHINCTEROTOMY  12/28/2021   Procedure: SPHINCTEROTOMY;  Surgeon: Lori Ada, MD;  Location: [place];  Service: Gastroenterology;;   TEE WITHOUT CARDIOVERSION N/A 01/03/2017   Procedure: TRANSESOPHAGEAL ECHOCARDIOGRAM (TEE);  Surgeon: Lori Blanks, MD;  Location: [place];  Service: Open Heart Surgery;  Laterality: N/A;   TONSILLECTOMY     TRANSCATHETER AORTIC VALVE REPLACEMENT, TRANSFEMORAL N/A 01/03/2017   Procedure: TRANSCATHETER AORTIC VALVE REPLACEMENT, TRANSFEMORAL;  Surgeon: Lori Blanks, MD;  Location: [place];  Service: Open Heart Surgery;  Laterality: N/A;   TUMOR REMOVAL       Home Medications:  Prior to Admission medications   Medication Sig Start Date End Date Taking? Authorizing Provider  acetaminophen (TYLENOL) 650 MG CR tablet Take 650-1,300 mg by mouth 2 (two) times daily as needed for pain.   Yes [provider]  apixaban (ELIQUIS) 2.5 MG TABS tablet Take 1 tablet (2.5 mg total) by mouth 2 (two) times daily. 01/03/22  Yes Lavina Hamman, MD  Carboxymethylcellulose Sodium (EYE DROPS OP) Apply 2 drops to eye daily as needed (gritty eyes).   Yes [provider]  Cholecalciferol (VITAMIN  D-3) 25 MCG (1000 UT) CAPS Take 1,000 Units by mouth daily.   Yes [provider]  docusate sodium (COLACE) 100 MG capsule Take 100 mg by mouth daily.   Yes [provider]  Ensure (ENSURE) Take 237 mLs by mouth every other day.   Yes [provider]  fluticasone (FLOVENT HFA) 220 MCG/ACT inhaler INHALE 2 PUFFS INTO THE LUNGS TWICE DAILY Patient taking differently: Inhale 2 puffs into the lungs 2 (two) times daily. 07/14/21  Yes Lori Lung, MD  LINZESS 72 MCG capsule Take 1 capsule (72 mcg total) by mouth daily. 02/10/21  Yes Medina-Vargas, Monina C, NP  loratadine  (CLARITIN) 10 MG tablet Take 1 tablet (10 mg total) by mouth daily. 02/10/21  Yes Medina-Vargas, Monina C, NP  nitroGLYCERIN (NITROSTAT) 0.4 MG SL tablet DISSOLVE 1 TABLET UNDER THE TONGUE EVERY 5 MINUTES AS NEEDED FOR CHEST PAIN 11/01/21  Yes Lori Blanks, MD  OVER THE COUNTER MEDICATION Take by mouth daily as needed (cough). Guaifenesin liquid PO 1 sip by mouth daily as needed for cough   Yes [provider]  pantoprazole (PROTONIX) 40 MG tablet Take 1 tablet (40 mg total) by mouth 2 (two) times daily. 01/26/22  Yes Lori Lung, MD  rosuvastatin (CRESTOR) 20 MG tablet Take 1 tablet (20 mg total) by mouth daily. 01/26/22  Yes Lori Lung, MD  VENTOLIN HFA 108 (90 Base) MCG/ACT inhaler INHALE 2 PUFFS INTO THE LUNGS EVERY 6 HOURS AS NEEDED FOR WHEEZING OR SHORTNESS OF BREATH Patient taking differently: Inhale 2 puffs into the lungs every 6 (six) hours as needed for wheezing or shortness of breath. 07/15/21  Yes Lori Lung, MD  Wheat Dextrin (BENEFIBER PO) Take 1 Scoop by mouth daily as needed (fiber).   Yes [provider]    Inpatient Medications: Scheduled Meds:  apixaban  2.5 mg Oral BID   budesonide (PULMICORT) nebulizer solution  0.25 mg Nebulization BID   docusate sodium  100 mg Oral BID   linaclotide  72 mcg Oral Daily   loratadine  10 mg Oral Daily   pantoprazole  40 mg Oral BID   rosuvastatin  20 mg Oral Daily   sodium chloride flush  3 mL Intravenous Q12H   Continuous Infusions:  PRN Meds: acetaminophen **OR** acetaminophen, albuterol, bisacodyl, hydrALAZINE, ondansetron **OR** ondansetron (ZOFRAN) IV, polyethylene glycol  Allergies:    Allergies  Allergen Reactions   Other Other (See Comments)    NO ACIDIC, TART< OR SPICY FOODS- DEVELOPS REFLUX OFTEN!!  PATIENT HAS TROUBLE SWALLOWING TABLETS!!   Bactrim [Sulfamethoxazole-Trimethoprim] Other (See Comments)    "makes her feel funny" or "unsteady"    Amitriptyline Hcl Rash   Aspirin  Hives, Swelling, Rash and Other (See Comments)    Body became swollen   Tape Other (See Comments)    SKIN IS VERY THIN- Lori TEAR EASILY!!   Zetia [Ezetimibe] Rash    Social History:   Social History   Socioeconomic History   Marital status: Widowed    Spouse name: Not on file   Number of children: 3   Years of education: Not on file   Highest education level: Not on file  Occupational History    Employer: RETIRED  Tobacco Use   Smoking status: Never   Smokeless tobacco: Former    Types: Snuff    Quit date: 05/23/2001   Tobacco comments:    Former Snuff (02/04/2021)  Vaping Use   Vaping Use: Never used  Substance and Sexual Activity   Alcohol use: No   Drug use: No   Sexual activity: Not Currently  Other Topics Concern   Not on file  Social History Narrative   Not on file   Social Determinants of Health   Financial Resource Strain: Low Risk  (04/09/2021)   Overall Financial Resource Strain (CARDIA)    Difficulty of Paying Living Expenses: Not hard at all  Food Insecurity: No Food Insecurity (04/09/2021)   Hunger Vital Sign    Worried About Running Out of Food in the Last Year: Never true    Ran Out of Food in the Last Year: Never true  Transportation Needs: No Transportation Needs (12/31/2021)   PRAPARE - Hydrologist (Medical): No    Lack of Transportation (Non-Medical): No  Physical Activity: Insufficiently Active (04/09/2021)   Exercise Vital Sign    Days of Exercise per Week: 2 days    Minutes of Exercise per Session: 10 min  Stress: No Stress Concern Present (04/09/2021)   [place]    Feeling of Stress : Not at all  Social Connections: Not on file  Intimate Partner Violence: Not on file    Family History:    Family History  Problem Relation Age of Onset   Heart failure Mother    Other Father      ROS:  Please see the history of present illness.   All  other ROS reviewed and negative.     Physical Exam/Data:   Vitals:   02/18/22 0600 02/18/22 0615 02/18/22 0630 02/18/22 0645  BP: (!) 126/59 111/71 124/71 118/74  Pulse: 97 97 (!) 109 94  Resp: 18 (!) 29 19 (!) 24  Temp:      TempSrc:      SpO2: 96% 97% 90% 100%   No intake or output data in the 24 hours ending 02/18/22 0900    02/02/2022    1:48 PM 01/26/2022    4:07 PM 01/18/2022    2:01 PM  Last 3 Weights  Weight (lbs) 80 lb 6.4 oz 79 lb 79 lb  Weight (kg) 36.469 kg 35.834 kg 35.834 kg     There is no height or weight on file to calculate BMI.  General:  Frail, chronically ill female. Laying flat in the bed in no acute distress  HEENT: normal Neck: no JVD Vascular: Radial pulses 2+ bilaterally Cardiac:  normal S1, S2; RRR; grade 2/6 systolic murmur present  Lungs:  crackles in bilateral [name] bases, otherwise clear to auscultation bilaterally. Normal WOB on [place] Abd: soft, nontender, no hepatomegaly  Ext: no edema Musculoskeletal:  No deformities, BUE and BLE strength normal and equal Skin: warm and dry  Neuro:  CNs 2-12 intact, no focal abnormalities noted Psych:  Normal affect   EKG:  The EKG was personally reviewed and demonstrates:  Sinus rhythm, LBBB Telemetry:  Telemetry was personally reviewed and demonstrates:  Sinus rhythm, occasionally paced, HR in 70s-80s  Relevant CV Studies:   Laboratory Data:  High Sensitivity Troponin:   Recent Labs  Lab 02/18/22 0015 02/18/22 0212 02/18/22 0450  TROPONINIHS 46* 170* 247*     Chemistry Recent Labs  Lab 02/18/22 0015  NA 138  K 3.4*  CL 101  CO2 28  GLUCOSE 118*  BUN 7*  CREATININE 0.54  CALCIUM 9.4  GFRNONAA >60  ANIONGAP 9    Recent Labs  Lab 02/18/22 0015  PROT 6.6  ALBUMIN 4.0  AST 23  ALT 14  ALKPHOS 36*  BILITOT 0.5   Lipids No results for input(s): "CHOL", "TRIG", "HDL", "LABVLDL", "LDLCALC", "CHOLHDL" in the last 168 hours.  Hematology Recent Labs  Lab 02/18/22 0015  WBC 7.9  RBC  4.46  HGB 13.6  HCT 41.4  MCV 92.8  MCH 30.5  MCHC 32.9  RDW 13.2  PLT 166   Thyroid No results for input(s): "TSH", "FREET4" in the last 168 hours.  BNP Recent Labs  Lab 02/18/22 0015  BNP 185.7*    DDimer No results for input(s): "DDIMER" in the last 168 hours.   Radiology/Studies:  DG Chest 2 View  Result Date: 02/18/2022 CLINICAL DATA:  Dyspnea EXAM: CHEST - 2 VIEW COMPARISON:  Radiograph 12/25/2021 FINDINGS: Hyperinflation. No focal consolidation, pleural effusion, or pneumothorax. No acute osseous abnormality. Small bilateral pleural effusions or pleural thickening. Stable cardiomediastinal silhouette. Aortic atherosclerotic calcification. Sternotomy. TAVR. Left chest wall pacemaker. No acute osseous abnormality. IMPRESSION: No active cardiopulmonary disease. Electronically Signed   By: Placido Sou M.D.   On: 02/18/2022 00:37     Assessment and Plan:   CAD s/p CABGx4 in 2006 Troponin Elevation  - Patient underwent CABG x4 in 2006. Most recent heart catheterization in 2018 showed severed triple vessel CAD s/p 4V CABG with 3/4 patent bypass grafts (LIMA to Diag was atretic) - Patient denies any chest pain  - hsTn 46>>170>>247 - EKG with LBBB (old) - Patient is not a candidate for cardiac catheterization given advanced age, frailty, DNR status. Lori continue to manage CAD medically  -  Continue crestor 20 mg daily  - Not on ASA as she is on DOAC, has aspirin allergy   Severe Mitral Valve Regurgitation  Chronic Systolic CHF  - Echocardiogram from 12/2021 showed EF 40-45% with regional wall motion abnormalities, normal RV systolic function, severe mitral valve regurgitation, moderate tricuspid valve regurgitation  - Unfortunately, patient is not a candidate for surgical repair or replacement of mitral valve - CXR without active cardiopulmonary disease. Euvolemic on exam. BNP 185.7  - BP soft, GDMT is limited  - Consider starting low dose lasix. Currently euvolemic on  exam, but daughter reports patient has occasional ankle edema.   Persistent Atrial Fibrillation  SSS s/p PPM  - HR well controlled per telemetry  - Continue eliquis 2.5 mg BID  - Device check from 8/29 showed persistent atrial fibrillation, rates paced. Lead parameters and battery status stable. Has an appointment to see EP on 10/4.   AS s/p TAVR - Echocardiogram from 8/23 showed trivial aortic valve regurgitation, AV mean gradient of 7.7 mmHg, Vmax 1.96 m/s  Dyspnea  - Patient presents complaining of SOB. Has been an ongoing complaint for a few weeks/months  - Suspect this is multifactorial. Patient has a history of asthma, chronic bronchitis, CHF, CAD, severe moderate mitral valve regurgitation. Daughter also notes that patient has been having anxiety recently that could be contributing.  - As mentioned above, patient is not a candidate for mitral valve intervention. Also not a candidate for cardiac catheterization.   Risk Assessment/Risk Scores:  { New York Heart Association (NYHA) Functional Class NYHA Class III  CHA2DS2-VASc Score = 8   This indicates a 10.8% annual risk of stroke. The patient's score is based upon: CHF History: 1 HTN History: 1 Diabetes History: 0 Stroke History: 2 Vascular Disease History: 1 Age Score: 2 Gender Score: 1       For questions or updates, please contact [facility] Please consult [email or web address] for contact info under    Signed, Margie Billet, PA-C  02/18/2022 9:00 AM  Patient examined chart reviewed Discussed care with patient, daughter and PA Exam with thin frail white female No volume overload Not tachypnic Lungs clear SEM through TAVR valve apical MR murmur PPM under left clavicle abdomen benign no edema.  Troponin elevation with no chest pain ECG intermittent pacing with history of LBBB CXR shows NAD BNP is low Some dyspnea may be related to moderate to severe MR. Review of her echo shows the MV is calcified/thick with  restricted motion and not a candidate for mitral clip Her grafts were patent on cath in 2018 and she is not having chest pain. She can be d/c home from our standpoint No further cardiac w/u needed  Jenkins Rouge MD [facility]

## 2022-02-18 NOTE — H&P (Signed)
History and Physical    Patient: [name] LFY:[id number] DOB: 04/23/1929 DOA: 02/17/2022 DOS: the patient was seen and examined on 02/18/2022 PCP: Denita Lung, MD  Patient coming from: Home - lives with daughter; Donald Prose: Daughter, [phone]   Chief Complaint: SOB  HPI: [name] is a 88 y.o. female with medical history significant of CAD s/p CABG; pacemaker placement; chronic diastolic CHF; HTN; HLD; and afib on Eliquis presenting with SOB.  Her daughter reports SOB.  She has had that for some time  She was sitting in front of the window [facility] unit trying to get air but she got more and more SOB.  No CP.  She is not normally on O2.  She has chronic bronchitis and asthma; she coughs and spits up mucus periodically but it has bene ok lately.  She does get a little choked sometimes with eating/drinking.  Her daughter doesn't think that happened yesterday but she was unable to swallow some of her chicken and spit it out.  She became very agitated at the conclusion of the visit, insistent that she really wants to go home to her house today.    ER Course:  Carryover, per Dr. Marlowe Sax:  88 year old female with complaints of shortness of breath and severe anxiety.  Not hypoxic but placed on supplemental oxygen for comfort.  Troponin trending up from 40s >> 200s.  Not having chest pain.  In A-fib but rate controlled.  Chest x-ray negative.  Cardiology will consult in the morning.     Review of Systems: As mentioned in the history of present illness. All other systems reviewed and are negative. Past Medical History:  Diagnosis Date   Anxiety    Aortic Stenosis s/p TAVR    Echo 10/21: EF 55-60, no RWMA, mild asymmetric LVH, normal RVSF, RVSP 40.2 mmHg, mild MR, s/p TAVR with mean gradient 8.36mHg, no PVL   Arthritis    OSTEO   Aspirin allergy    on Plavix   Asthma    CAD (coronary artery disease)    a. s/p CABG 2006 with Cox-Maze procedure.   Carotid artery [place]    [place]. s/p  R CEA.   Diastolic dysfunction    Eczema    Fibromyalgia    GERD (gastroesophageal reflux disease)    Hiatal hernia    Hyperlipidemia    Hypertension    Kyphoscoliosis    Mitral regurgitation    PA[place]ox[place]mal [place] fibrillation) [place]    a. pt has h/o hematuria on Eliquis and has since refused anticoagulation   Pneumonia 2015 ?   Pulmonary regurgitation    Skin cancer of arm    Stroke ([facility]    Tricuspid regurgitation    Past Surgical History:  Procedure Laterality Date   ABDOMINAL HYSTERECTOMY  1976   CAROTID ENDARTERECTOMY  2010   CORONARY ANGIOPLASTY WITH STENT PLACEMENT     CORONARY ARTERY BYPASS GRAFT  01/2005   LIMA-D1; SVG-LAD; SVG-OM; SVG-PDA   ERCP N/A 12/28/2021   Procedure: ENDOSCOPIC RETROGRADE CHOLANGIOPANCREATOGRAPHY (ERCP);  Surgeon: HCarol Ada MD;  [place]  Service: Gastroenterology;  Laterality: N/A;   ESOPHAGOGASTRODUODENOSCOPY (EGD) WITH PROPOFOL N/A 03/19/2021   Procedure: ESOPHAGOGASTRODUODENOSCOPY (EGD) WITH PROPOFOL;  Surgeon: HCarol Ada MD;  Location: [facility];  Service: Endoscopy;  Laterality: N/A;   EYE SURGERY     MULTIPLE EXTRACTIONS WITH ALVEOLOPLASTY  12/28/2016   Extraction of tooth #'s 2- 5,7-10, 12,13,17-20,and 22-29 with alveoloplasty and maxillary right and left lateral exostoses  reductions   MULTIPLE EXTRACTIONS WITH ALVEOLOPLASTY N/A 12/28/2016   Procedure: Extraction of tooth #'s 2- 5,7-10, 12,13,17-20,and 22-29 with alveoloplasty and maxillary right and left lateral exostoses reductions;  Surgeon: Lenn Cal, DDS;  Location: [place];  Service: Oral Surgery;  Laterality: N/A;   PACEMAKER IMPLANT N/A 01/15/2021   Procedure: PACEMAKER IMPLANT;  Surgeon: Constance Haw, MD;  Location: [place] [facility];  Service: Cardiovascular;  Laterality: N/A;   REMOVAL OF STONES  12/28/2021   Procedure: REMOVAL OF STONES;  Surgeon: Carol Ada, MD;  Location: [place];  Service: Gastroenterology;;   RIGHT/LEFT HEART  CATH AND CORONARY/GRAFT ANGIOGRAPHY N/A 10/27/2016   Procedure: Right/Left Heart Cath and Coronary/Graft Angiography;  Surgeon: Burnell Blanks, MD;  Location: [place] CV [facility];  Service: Cardiovascular;  Laterality: N/A;   SAVORY DILATION N/A 03/19/2021   Procedure: SAVORY DILATION;  Surgeon: Carol Ada, MD;  Location: [facility];  Service: Endoscopy;  Laterality: N/A;   SPHINCTEROTOMY  12/28/2021   Procedure: SPHINCTEROTOMY;  Surgeon: Carol Ada, MD;  Location: [place];  Service: Gastroenterology;;   TEE WITHOUT CARDIOVERSION N/A 01/03/2017   Procedure: TRANSESOPHAGEAL ECHOCARDIOGRAM (TEE);  Surgeon: Burnell Blanks, MD;  Location: [place];  Service: Open Heart Surgery;  Laterality: N/A;   TONSILLECTOMY     TRANSCATHETER AORTIC VALVE REPLACEMENT, TRANSFEMORAL N/A 01/03/2017   Procedure: TRANSCATHETER AORTIC VALVE REPLACEMENT, TRANSFEMORAL;  Surgeon: Burnell Blanks, MD;  Location: [place];  Service: Open Heart Surgery;  Laterality: N/A;   TUMOR REMOVAL     Social History:  reports that she has never smoked. She quit smokeless tobacco use about 20 years ago.  Her smokeless tobacco use included snuff. She reports that she does not drink alcohol and does not use drugs.  Allergies  Allergen Reactions   Other Other (See Comments)    NO ACIDIC, TART< OR SPICY FOODS- DEVELOPS REFLUX OFTEN!!  PATIENT HAS TROUBLE SWALLOWING TABLETS!!   Bactrim [Sulfamethoxazole-Trimethoprim] Other (See Comments)    "makes her feel funny" or "unsteady"    Amitriptyline Hcl Rash   Aspirin Hives, Swelling, Rash and Other (See Comments)    Body became swollen   Tape Other (See Comments)    SKIN IS VERY THIN- WILL TEAR EASILY!!   Zetia [Ezetimibe] Rash    Family History  Problem Relation Age of Onset   Heart failure Mother    Other Father     Prior to Admission medications   Medication Sig Start Date End Date Taking? Authorizing Provider  acetaminophen (TYLENOL) 650 MG CR  tablet Take 650-1,300 mg by mouth 2 (two) times daily as needed for pain.   Yes [provider]  apixaban (ELIQUIS) 2.5 MG TABS tablet Take 1 tablet (2.5 mg total) by mouth 2 (two) times daily. 01/03/22  Yes Lavina Hamman, MD  Carboxymethylcellulose Sodium (EYE DROPS OP) Apply 2 drops to eye daily as needed (gritty eyes).   Yes [provider]  Cholecalciferol (VITAMIN D-3) 25 MCG (1000 UT) CAPS Take 1,000 Units by mouth daily.   Yes [provider]  docusate sodium (COLACE) 100 MG capsule Take 100 mg by mouth daily.   Yes [provider]  Ensure (ENSURE) Take 237 mLs by mouth every other day.   Yes [provider]  fluticasone (FLOVENT HFA) 220 MCG/ACT inhaler INHALE 2 PUFFS INTO THE LUNGS TWICE DAILY Patient taking differently: Inhale 2 puffs into the lungs 2 (two) times daily. 07/14/21  Yes Denita Lung, MD  Rolan Lipa  72 MCG capsule Take 1 capsule (72 mcg total) by mouth daily. 02/10/21  Yes Medina-Vargas, Monina C, NP  loratadine (CLARITIN) 10 MG tablet Take 1 tablet (10 mg total) by mouth daily. 02/10/21  Yes Medina-Vargas, Monina C, NP  nitroGLYCERIN (NITROSTAT) 0.4 MG SL tablet DISSOLVE 1 TABLET UNDER THE TONGUE EVERY 5 MINUTES AS NEEDED FOR CHEST PAIN 11/01/21  Yes Burnell Blanks, MD  OVER THE COUNTER MEDICATION Take by mouth daily as needed (cough). Guaifenesin liquid PO 1 sip by mouth daily as needed for cough   Yes [provider]  pantoprazole (PROTONIX) 40 MG tablet Take 1 tablet (40 mg total) by mouth 2 (two) times daily. 01/26/22  Yes Denita Lung, MD  rosuvastatin (CRESTOR) 20 MG tablet Take 1 tablet (20 mg total) by mouth daily. 01/26/22  Yes Denita Lung, MD  VENTOLIN HFA 108 (90 Base) MCG/ACT inhaler INHALE 2 PUFFS INTO THE LUNGS EVERY 6 HOURS AS NEEDED FOR WHEEZING OR SHORTNESS OF BREATH Patient taking differently: Inhale 2 puffs into the lungs every 6 (six) hours as needed for wheezing or shortness of breath. 07/15/21   Yes Denita Lung, MD  Wheat Dextrin (BENEFIBER PO) Take 1 Scoop by mouth daily as needed (fiber).   Yes [provider]  doxycycline (VIBRA-TABS) 100 MG tablet Take 1 tablet (100 mg total) by mouth 2 (two) times daily. Patient not taking: Reported on 02/18/2022 01/31/22   Denita Lung, MD    Physical Exam: Vitals:   02/18/22 0630 02/18/22 0645 02/18/22 0900 02/18/22 0907  BP: 124/71 118/74 115/67   Pulse: (!) 109 94 77   Resp: 19 (!) 24 (!) 24   Temp:    98.7 F (37.1 C)  TempSrc:    Oral  SpO2: 90% 100% 100%    General:  Appears calm and comfortable and is in NAD Eyes:   EOMI, normal lids, iris ENT:  grossly normal hearing, lips & tongue, mmm Neck:  no LAD, masses or thyromegaly Cardiovascular:  RRR, 7-7/8 systolic murmur, no r/g. No LE edema.  Respiratory:   CTA bilaterally with no wheezes/rales/rhonchi.  Normal respiratory effort, on Point O2. Abdomen:  soft, NT, ND Skin:  no rash or induration seen on limited exam Musculoskeletal:  grossly normal tone BUE/BLE, good ROM, no bony abnormality Psychiatric:  blunted and periodically agitated mood and affect, speech fluent and appropriate, AOx2 Neurologic:  CN 2-12 grossly intact, moves all extremities in coordinated fashion   Radiological Exams on Admission: Independently reviewed - see discussion in A/P where applicable  DG Chest 2 View  Result Date: 02/18/2022 CLINICAL DATA:  Dyspnea EXAM: CHEST - 2 VIEW COMPARISON:  Radiograph 12/25/2021 FINDINGS: Hyperinflation. No focal consolidation, pleural effusion, or pneumothorax. No acute osseous abnormality. Small bilateral pleural effusions or pleural thickening. Stable cardiomediastinal silhouette. Aortic atherosclerotic calcification. Sternotomy. TAVR. Left chest wall pacemaker. No acute osseous abnormality. IMPRESSION: No active cardiopulmonary disease. Electronically Signed   By: Placido Sou M.D.   On: 02/18/2022 00:37    EKG: Independently reviewed.  Ventricular  paced rhythm with rate 105; NSCSLT.  Repeat EKG this AM appears to look similar to/better than last night's.   Labs on Admission: I have personally reviewed the available labs and imaging studies at the time of the admission.  Pertinent labs:    Glucose 118 BNP 185.7 HS troponin 46, 170, 247 Normal CBC   Assessment and Plan: Principal Problem:   Acute respiratory failure with hypoxia (HCC) Active Problems:  Dyslipidemia   PAF (paroxysmal atrial fibrillation) (HCC)   CAD (coronary artery disease) of artery bypass graft   Elevated troponin   Chronic systolic CHF (congestive heart failure) (HCC)   DNR (do not resuscitate)    Acute respiratory failure with hypoxia -Patient without underlying known respiratory disease presenting with SOB (has episodic SOB) and hypoxia to 90% -No other clear respiratory symptoms -Negative CXR -She is on Eliquis so PE is pretty unlikely -Uptrending troponin, concerning for ACS in the setting of known CAD -Will observe on telemetry -Cardiology consult -Wean O2 as tolerated -Her episodes of CP may be anginal equivalent -The other consideration is periodic aspiration, as her daughter does report some concern about this issue; for now will keep NPO and ask ST to see her  Troponin elevation, known CAD -s/p CABG -Repeat cath in 2018 showed severe triple vessel disease with 3/4 patent grafts -Uptrending troponin but no clear CP -Will continue to trend troponin until it peaks -Cardiology consult -Medical management, as she is not a catheterization/intervention candidate -Continue Eliquis (has ASA allergy)  Chronic systolic CHF -8/8 echo with EF 40-45% and severe hypokinesis -Minimally elevated BNP, no edema on CXR -Doubt acute CHF exacerbation at this time  HTN -She does not appear to be taking medications for this issue at this time  -She may benefit from addition of low-dose BB  HLD -Continue rosuvastatin  Afib -Rate controlled without  medication -Has pacemaker -Continue Eliquis  DNR -I have discussed code status with the patient and her daughter; the patient would not desire resuscitation and would prefer to die a natural death should that situation arise. -She will need a gold out of facility DNR form at the time of discharge   [place] -She has been seeing palliative care at home -The patient became agitated in the ER and was demanding to go home; her daughter reports that overnight she was insistent to come to the ER -Ongoing [place] conversations will be important -Continue outpatient palliative care for now   Advance Care Planning:   Code Status: DNR   Consults: Cardiology; ST  DVT Prophylaxis: Eliquis  Family Communication: Daughter was present throughout evaluation  Severity of Illness: The appropriate patient status for this patient is OBSERVATION. Observation status is judged to be reasonable and necessary in order to provide the required intensity of service to ensure the patient's safety. The patient's presenting symptoms, physical exam findings, and initial radiographic and laboratory data in the context of their medical condition is felt to place them at decreased risk for further clinical deterioration. Furthermore, it is anticipated that the patient will be medically stable for discharge from the hospital within 2 midnights of admission.   Author: Karmen Bongo, MD 02/18/2022 1:03 PM  For on call review [email or web address].

## 2022-02-18 NOTE — ED Notes (Signed)
Pt in room resting. NAD chest rising and falling.  

## 2022-02-18 NOTE — ED Notes (Addendum)
Charge nurse notified on patient's elevated Troponin , will move to next available bed. Marland Kitchen

## 2022-02-18 NOTE — ED Notes (Signed)
Patient awake and alert this morning, no s/s of distress, she is sitting up in bed talking with family, will continue to monitor.

## 2022-02-19 DIAGNOSIS — E785 Hyperlipidemia, unspecified: Secondary | ICD-10-CM | POA: Diagnosis not present

## 2022-02-19 DIAGNOSIS — I5022 Chronic systolic (congestive) heart failure: Secondary | ICD-10-CM

## 2022-02-19 DIAGNOSIS — I480 Paroxysmal atrial fibrillation: Secondary | ICD-10-CM

## 2022-02-19 DIAGNOSIS — I25708 Atherosclerosis of coronary artery bypass graft(s), unspecified, with other forms of angina pectoris: Secondary | ICD-10-CM

## 2022-02-19 DIAGNOSIS — R778 Other specified abnormalities of plasma proteins: Secondary | ICD-10-CM | POA: Diagnosis not present

## 2022-02-19 DIAGNOSIS — J9601 Acute respiratory failure with hypoxia: Secondary | ICD-10-CM | POA: Diagnosis not present

## 2022-02-19 MED ORDER — ORAL CARE MOUTH RINSE: 15.0000 mL | OROMUCOSAL | Status: DC | PRN

## 2022-02-19 NOTE — Progress Notes (Signed)
Order to discharge pt home.  Discharge instructions/AVS given to daughter and patient and reviewed - education provided as needed.  Advised to call PCP and/or come back to the hospital if there are any problems. Daughter and pt verbalized understanding.     Daughter requesting for pt to discharge after she eats dinner.  Food tray arrived at 16:15.

## 2022-02-19 NOTE — Progress Notes (Signed)
Rounding Note    Patient Name: [name] Date of Encounter: 02/19/2022  [place] Cardiologist: Lauree Chandler, MD   Subjective   Sleeping comfortably. States breathing is improved. No chest pain.  Trop peaked at 247 now downtrending to 194 HD stable and on RA  Inpatient Medications    Scheduled Meds:  apixaban  2.5 mg Oral BID   budesonide (PULMICORT) nebulizer solution  0.25 mg Nebulization BID   docusate sodium  100 mg Oral BID   linaclotide  72 mcg Oral Daily   loratadine  10 mg Oral Daily   pantoprazole  40 mg Oral BID   rosuvastatin  20 mg Oral Daily   sodium chloride flush  3 mL Intravenous Q12H   Continuous Infusions:  PRN Meds: acetaminophen **OR** acetaminophen, albuterol, bisacodyl, hydrALAZINE, ondansetron **OR** ondansetron (ZOFRAN) IV, polyethylene glycol   Vital Signs    Vitals:   02/18/22 2215 02/19/22 0012 02/19/22 0039 02/19/22 0454  BP:  (!) 139/91 139/74 (!) 101/54  Pulse:  (!) 102 100 86  Resp:  20  19  Temp: 98.2 F (36.8 C) 98.3 F (36.8 C) 98.3 F (36.8 C) (!) 97.3 F (36.3 C)  TempSrc: Oral Oral Oral Axillary  SpO2:  99% 98% 100%  Weight:  36.2 kg    Height:  '5\' 2"'$  (1.575 m)     No intake or output data in the 24 hours ending 02/19/22 0634    02/19/2022   12:12 AM 02/02/2022    1:48 PM 01/26/2022    4:07 PM  Last 3 Weights  Weight (lbs) 79 lb 12.9 oz 80 lb 6.4 oz 79 lb  Weight (kg) 36.2 kg 36.469 kg 35.834 kg      Telemetry    V-paced - Personally Reviewed  ECG    SR with LBBB; occasional v-pacing - Personally Reviewed  Physical Exam   GEN: Elderly female, frail appearing NAD Neck: No JVD Cardiac: RRR, 2/6 systolic murmur Respiratory: Diminished at bases GI: Soft, nontender, non-distended  MS: Thin, warm, no edema Neuro:  Nonfocal  Psych: Normal affect   Labs    High Sensitivity Troponin:   Recent Labs  Lab 02/18/22 0015 02/18/22 0212 02/18/22 0450 02/18/22 1325  TROPONINIHS 46* 170*  247* 194*     Chemistry Recent Labs  Lab 02/18/22 0015  NA 138  K 3.4*  CL 101  CO2 28  GLUCOSE 118*  BUN 7*  CREATININE 0.54  CALCIUM 9.4  PROT 6.6  ALBUMIN 4.0  AST 23  ALT 14  ALKPHOS 36*  BILITOT 0.5  GFRNONAA >60  ANIONGAP 9    Lipids No results for input(s): "CHOL", "TRIG", "HDL", "LABVLDL", "LDLCALC", "CHOLHDL" in the last 168 hours.  Hematology Recent Labs  Lab 02/18/22 0015  WBC 7.9  RBC 4.46  HGB 13.6  HCT 41.4  MCV 92.8  MCH 30.5  MCHC 32.9  RDW 13.2  PLT 166   Thyroid No results for input(s): "TSH", "FREET4" in the last 168 hours.  BNP Recent Labs  Lab 02/18/22 0015  BNP 185.7*    DDimer No results for input(s): "DDIMER" in the last 168 hours.   Radiology    DG Chest 2 View  Result Date: 02/18/2022 CLINICAL DATA:  Dyspnea EXAM: CHEST - 2 VIEW COMPARISON:  Radiograph 12/25/2021 FINDINGS: Hyperinflation. No focal consolidation, pleural effusion, or pneumothorax. No acute osseous abnormality. Small bilateral pleural effusions or pleural thickening. Stable cardiomediastinal silhouette. Aortic atherosclerotic calcification. Sternotomy. TAVR. Left chest wall  pacemaker. No acute osseous abnormality. IMPRESSION: No active cardiopulmonary disease. Electronically Signed   By: Placido Sou M.D.   On: 02/18/2022 00:37    Cardiac Studies   TTE 12/28/21: IMPRESSIONS     1. Left ventricular ejection fraction, by estimation, is 40 to 45%. The  left ventricle has mildly decreased function. The left ventricle  demonstrates regional wall motion abnormalities (see scoring  diagram/findings for description). Left ventricular  diastolic function could not be evaluated. There is severe hypokinesis of  the left ventricular, basal-mid inferolateral wall.   2. Right ventricular systolic function is normal. The right ventricular  size is normal. There is moderately elevated pulmonary artery systolic  pressure. The estimated right ventricular systolic pressure is  46.2 mmHg.   3. Left atrial size was mildly dilated.   4. Right atrial size was mildly dilated.   5. Multiple jets of mitral insufficiency are seen. The dominant jet is  eccentric and posteriorly directed. A centrally directed jet is also seen.  The cumulative degree of regurgitation is probably severe. The mitral  valve is myxomatous. Severe mitral  valve regurgitation. There is holosystolic prolapse of both leaflets of  the mitral valve. The mean mitral valve gradient is 4.0 mmHg.   6. The tricuspid valve is myxomatous. Tricuspid valve regurgitation is  moderate.   7. The aortic valve has been repaired/replaced. Aortic valve  regurgitation is trivial. There is a 23 mm Sapien prosthetic (TAVR) valve  present in the aortic position. Procedure Date: 01/03/2017. Aortic valve  mean gradient measures 7.7 mmHg. Aortic valve  Vmax measures 1.96 m/s. Aortic valve acceleration time measures 50 msec.   8. Pulmonic valve regurgitation is moderate.   9. The inferior vena cava is dilated in size with <50% respiratory  variability, suggesting right atrial pressure of 15 mmHg.   Comparison(s): The left ventricular function is worsened. The left  ventricular wall motion abnormality is new. Mitral regurgitation is worse  (may have been previously underestimated).   Patient Profile     88 y.o. female with history of CAD s/p CABG, paroxysmal atrial fibrillation s/p MAZE, CAS s/p CEA, fibromyalgia, GERD, moderate MR, severe AS s/p TAVR, sss s/p PPM, chronic diastolic heart failure, HTN, HLD, asthma who presented to the ER with SOB found to have elevated troponin 46>>247 for which Cardiology was consulted.   Assessment & Plan    #CAD s/p CABG x4 in 2006 #Elevated troponin: #SOB: Patient with history of CABG in 2006 with most recent [place]  in 2018 revealing severe native vessel disease with 3/4 patent bypasses (LIMA to diag was atretic). Presented to the ER with SOB found to have trop 46>247. Denies any  current chest pain and breathing has improved this AM. Given advanced age, comorbidities and DNR status, the decision was made to proceed with medical management. -Not on ASA due to allergy; on apixaban for Afib -Continue crestor '20mg'$  daily  #Chronic Systolic HF: TTE 70/3500 with  EF 40-45%, normal RV systolic function, severe mitral valve regurgitation, moderate tricuspid valve regurgitation. On medical management for underlying CAD and MR given advanced age and comorbidities as above. Currently euvolemic on exam. -Euvolemic not on diuretics  #Persistent Afib: #SSS s/p PPM: V-paced on the monitor this morning. On apixaban for [facility]. Not on nodal agents and HR controlled.  -Continue apixaban 2.'5mg'$  BID  #Severe AS s/p TAVR: Last TTE 12/2021 with well functioning TAVR valve with trivial AI. Vmax 1.96, mean gradient 7.7.  #Severe MR: Not a candidate for  advanced therapies. Continuing with medical management.  Cardiology will sign-off. Please feel free to call with questions or concerns.   For questions or updates, please contact [place] Please consult [email or web address] for contact info under        Signed, Freada Bergeron, MD  02/19/2022, 6:34 AM

## 2022-02-19 NOTE — Plan of Care (Signed)

## 2022-02-19 NOTE — TOC Initial Note (Signed)
Transition of Care [facility]) - Initial/Assessment Note    Patient Details  Name: [name] MRN: [id number] Date of Birth: 1928-07-18  Transition of Care [facility]) CM/SW Contact:    Carles Collet, RN Phone Number: 02/19/2022, 2:38 PM  Clinical Narrative:                  Patient active w Alvis Lemmings for [facility] services. Spoke w PT after assessment. He requested that Alvis Lemmings assess the daughter, primary caretaker, for handling assistance, and assess current DME for any updates to best service the patient after DC. [facility] notified of needs.  Please place [facility] OT PT orders for DC. TOC will continue to follow.   Expected Discharge Plan: [place] Barriers to Discharge: No Barriers Identified   Patient Goals and CMS Choice Patient states their goals for this hospitalization and ongoing recovery are:: to go home CMS Medicare.gov Compare Post Acute Care list provided to:: Patient Choice offered to / list presented to : Patient  Expected Discharge Plan and Services Expected Discharge Plan: [place]   Discharge Planning Services: CM Consult Post Acute Care Choice: Home Health                             HH Arranged: PT, OT [facility] Agency: [place] Date [place]: 02/19/22 Time HH Agency Contacted: [id number] Representative spoke with at [place]: Tommi Rumps  Prior Living Arrangements/Services                       Activities of Daily Living      Permission Sought/Granted                  Emotional Assessment              Admission diagnosis:  SOB (shortness of breath) [R06.02] Anxiety state [F41.1] NSTEMI (non-ST elevated myocardial infarction) ([place]) [I21.4] Patient Active Problem List   Diagnosis Date Noted   Acute respiratory failure with hypoxia ([place]) 01/03/4817   Chronic systolic CHF (congestive heart failure) ([place]) 02/18/2022   DNR (do not resuscitate) 02/18/2022   Choledocholithiasis with acute cholecystitis  01/26/2022   Elevated troponin 01/03/2022   Secondary hypercoagulable state ([place]) 02/04/2021   Unexplained weight loss 01/21/2021   Protein-calorie malnutrition, severe 01/16/2021   Persistent atrial fibrillation ([place]) 01/14/2021   Sick sinus syndrome ([place]) 01/14/2021   Syncope 01/14/2021   Anemia 01/14/2021   Weakness 01/14/2021   GI bleed 04/22/2020   Hypokalemia    Presbycusis of both ears 11/13/2019   Vertigo 03/27/2017   Arthritis 03/27/2017   Stroke ([place])    Status post transcatheter aortic valve replacement (TAVR) using bioprosthesis 01/03/2017   Chronic apical periodontitis 12/28/2016   Retained dental roots 12/28/2016   Bilateral maxillary lateral exostoses 56/31/4970   Diastolic dysfunction    Chronic stable angina (HCC)    CAD (coronary artery disease) of artery bypass graft    Tricuspid regurgitation    Pulmonary regurgitation    Left shoulder pain    Back pain-similar to pt's angina 12/01/2015   Carotid artery disease- s/p RCE 12/01/2015   Bowen's disease 03/16/2015   Dependent edema 10/30/2014   History of allergy to aspirin 06/02/2014   PAF (paroxysmal atrial fibrillation) ([place])    Gastroesophageal reflux disease without esophagitis 12/05/2011   Hx of CABG 2006 with Maze 07/21/2011   Mitral regurgitation 07/21/2011  Allergic rhinitis 10/05/2010   Dyslipidemia 46/21/9471   Uncomplicated asthma 25/27/1292   PCP:  Denita Lung, MD Pharmacy:   [facility] [address], [place] [address] Phone: [phone] Fax: [phone]  [facility] [place], [place] - [place] [address] Phone: [phone] Fax: [place], [place] [address] [place] [address] Phone: [phone] Fax: [phone]     Social  Determinants of Health (SDOH) Interventions    Readmission Risk Interventions    12/30/2021   11:52 AM  Readmission Risk Prevention Plan  Post Dischage Appt Complete  Medication Screening Complete  Transportation Screening Complete

## 2022-02-19 NOTE — Care Management Obs Status (Signed)
[place] NOTIFICATION   Patient Details  Name: [name] MRN: [id number] Date of Birth: 09-08-1928   Medicare Observation Status Notification Given:  Yes    Carles Collet, RN 02/19/2022, 3:43 PM

## 2022-02-19 NOTE — Evaluation (Signed)
Physical Therapy Evaluation Patient Details Name: [name] MRN: [id number] DOB: 08/28/1928 Today's Date: 02/19/2022  History of Present Illness  Pt is a 88 y/o female admitted with progressively worsening SOB.  Working dx is acute respiratory failure with hypoxia.    Medical history significant of  CAD s/p CABG, atrial fib s/p MAZE procedure, CVA, carotid artery disease s/p right CEA, fibromyalgia, GERD, mild to moderate due to Choledocholithiasis. MR, severe AS s/p TAVR and sick sinus syndrome s/p pacemaker, diastolic dysfunction,HTN, HLD Asthma with medical history significant of  CAD s/p CABG, atrial fib s/p MAZE procedure, CVA, carotid artery disease s/p right CEA, fibromyalgia, GERD, mild to moderate MR, severe AS s/p TAVR and sick sinus syndrome s/p pacemaker, diastolic dysfunction,HTN, HLD Asthma  Clinical Impression  Pt is likely slowly moving through new level of baseline functioning, but should be safe at home with daughter's assist with some handling skill education and an updated assessment of DME and home equipment setup from HHPT. There are no further acute PT needs.  Will sign off at this time.        Recommendations for follow up therapy are one component of a multi-disciplinary discharge planning process, led by the attending physician.  Recommendations may be updated based on patient status, additional functional criteria and insurance authorization.  Follow Up Recommendations Home health PT      Assistance Recommended at Discharge Frequent or constant Supervision/Assistance  Patient can return home with the following  A little help with walking and/or transfers;A little help with bathing/dressing/bathroom;Assistance with cooking/housework;Direct supervision/assist for medications management;Direct supervision/assist for financial management;Assist for transportation;Help with stairs or ramp for entrance    Equipment Recommendations Other (comment) (needs DME assessment  and upday from [place] services.  Is it time for hospital bed? lift chair beneficial?  Update w/c-transport chair and gel cushion?)  Recommendations for Other Services       Functional Status Assessment Patient has had a recent decline in their functional status and demonstrates the ability to make significant improvements in function in a reasonable and predictable amount of time.     Precautions / Restrictions Precautions Precautions: Fall      Mobility  Bed Mobility Overal bed mobility: Needs Assistance Bed Mobility: Rolling, Sidelying to Sit, Sit to Supine Rolling: Mod assist Sidelying to sit: Mod assist   Sit to supine: Min assist, Mod assist   General bed mobility comments: Pt has very little mass, but needs moderate assist up via L elbow.  Educated dtr on use of incontinence padding to assist roll, scooting and pivoting.    Transfers Overall transfer level: Needs assistance Equipment used: Rolling walker (2 wheels), None Transfers: Sit to/from Stand Sit to Stand: Mod assist           General transfer comment: cues for hand placement, cues for dtr on better handling techniques.  generally light moderate assist.    Ambulation/Gait Ambulation/Gait assistance: Mod assist, +2 safety/equipment Gait Distance (Feet): 4 Feet (forward and back with rollator assist) Assistive device: Rollator (4 wheels) Gait Pattern/deviations: Step-to pattern, Step-through pattern       General Gait Details: unsteady weak uncoordinated gait over very short distance, but enough to relate to daughter that pt does need to be transfer only until/if she becomes a little stronger.  Stairs            Wheelchair Mobility    Modified Rankin (Stroke Patients Only)       Balance Overall balance assessment: Needs assistance Sitting-balance  support: Single extremity supported, Bilateral upper extremity supported, Feet supported Sitting balance-[name] Scale: Fair Sitting balance -  Comments: pt slumped forward, but able to maintain for time to prep for stand. Postural control: Other (comment) (forward) Standing balance support: Bilateral upper extremity supported Standing balance-[name] Scale: Poor Standing balance comment: relinat on external support                             Pertinent Vitals/Pain Pain Assessment Pain Assessment: Faces Faces Pain Scale: No hurt Pain Intervention(s): Monitored during session    Home Living Family/patient expects to be discharged to:: Private residence Living Arrangements: Children Available Help at Discharge: Family Type of Home: House Home Access: Stairs to enter Entrance Stairs-Rails: Can reach both;Right;Left Entrance Stairs-Number of Steps: 2   Home Layout: One level Home Equipment: Conservation officer, nature (2 wheels);Cane - single point;BSC/3in1;Other (comment);Transport chair Additional Comments: pt doesn't go into the bathroom, she uses her BSC and does sponge baths. Daughter helps wtih bathing and provides S for stairs. Pt uses transport chair when out of the house    Prior Function Prior Level of Function : Needs assist             Mobility Comments: transfers with supervision from bed-WC. uses transport chair in the house. can climb stairs with assist from daughter ADLs Comments: assist with ADL as needed; uses briefs     Hand Dominance   Dominant Hand: Right    Extremity/Trunk Assessment   Upper Extremity Assessment Upper Extremity Assessment: Generalized weakness    Lower Extremity Assessment Lower Extremity Assessment: Generalized weakness       Communication   Communication: HOH  Cognition Arousal/Alertness: Awake/alert Behavior During Therapy: WFL for tasks assessed/performed Overall Cognitive Status: Within Functional Limits for tasks assessed (slow processing, tangential)                                          General Comments General comments (skin integrity,  edema, etc.): vss.  Pt's sats remained in the mid 90's on RA during the limited amount of mobility that we accomplished today on eval, which corresponds to her usual level of activity.    Exercises     Assessment/Plan    PT Assessment All further PT needs can be met in the next venue of care  PT Problem List Decreased strength;Decreased activity tolerance;Decreased balance;Decreased mobility;Decreased knowledge of use of DME;Cardiopulmonary status limiting activity       PT Treatment Interventions      PT Goals (Current goals can be found in the Care Plan section)  Acute Rehab PT Goals Patient Stated Goal: home, Pt's dtr needs some handling skill education and DME assessment from home evaluation with HHPT PT Goal Formulation: All assessment and education complete, DC therapy    Frequency       Co-evaluation               AM-PAC PT "6 Clicks" Mobility  Outcome Measure Help needed turning from your back to your side while in a flat bed without using bedrails?: A Lot Help needed moving from lying on your back to sitting on the side of a flat bed without using bedrails?: A Lot Help needed moving to and from a bed to a chair (including a wheelchair)?: A Lot Help needed standing up from a chair using your  arms (e.g., wheelchair or bedside chair)?: A Lot Help needed to walk in hospital room?: Total Help needed climbing 3-5 steps with a railing? : A Lot 6 Click Score: 11    End of Session   Activity Tolerance: Patient tolerated treatment well;Patient limited by fatigue Patient left: in bed;with call bell/phone within reach;with family/visitor present Nurse Communication: Mobility status PT Visit Diagnosis: Other abnormalities of gait and mobility (R26.89);Muscle weakness (generalized) (M62.81);Difficulty in walking, not elsewhere classified (R26.2)    Time: 4324-6997 PT Time Calculation (min) (ACUTE ONLY): 48 min   Charges:   PT Evaluation $PT Eval Moderate Complexity: 1  Mod PT Treatments $Therapeutic Activity: 8-22 mins $Self Care/Home Management: 8-22        02/19/2022  Ginger Carne., PT Acute Rehabilitation Services [phone]  (office)  Tessie Fass Rolena Knutson 02/19/2022, 4:55 PM

## 2022-02-19 NOTE — Discharge Summary (Signed)
Physician Discharge Summary  [name]:[id number] DOB: Mar 04, 1929 DOA: 02/17/2022  PCP: Denita Lung, MD  Admit date: 02/17/2022 Discharge date: 02/19/2022  Admitted From: Home Disposition: Home  Recommendations for Outpatient Follow-up:  Follow up with PCP in 1-2 weeks Repeat BMP on follow-up visit Follow-up with cardiology as a scheduled on Wednesday Follow-up with PCP for anxiety issues  Home Health: PT/OT Equipment/Devices: None Discharge Condition: Stable CODE STATUS: DNR Diet recommendation: Cardiac healthy diet  Brief/Interim Summary:  [name] is a 88 y.o. female with medical history significant of CAD s/p CABG; pacemaker placement; chronic diastolic CHF; HTN; HLD; and afib on Eliquis presenting with SOB.    Upon arrival to ED: Patient was not hypoxic but placed on supplemental oxygen for comfort.  Troponin trended up from 40-200s.  Chest x-ray negative.  Cardiology consulted.  Patient admitted for further evaluation and management.  Acute hypoxemic respiratory failure: No respiratory cause.  Chest x-ray negative.  She weaned off of oxygen to room air.  Remained afebrile with no leukocytosis. -Evaluated by PT-did not qualified for home oxygen  Coronary artery disease status post CABG x4 in 2006 Elevated troponin Denies of breath: -Cardiology consulted.  Patient denied any chest pain.  Given advanced age and multiple comorbidities and DNR status the decision was made to proceed with medical management. -Cardiology signed off.  Continued Crestor and Eliquis  Chronic systolic congestive heart failure: -TTE 12/2021 with  EF 40-45%, normal RV systolic function, severe mitral valve regurgitation, moderate tricuspid valve regurgitation. On medical management for underlying CAD and MR given advanced age and comorbidities as above. -Remained euvolemic not on diuretics  Hypertension: Remained stable -Not on any medications at home  Hyperlipidemia: Continued  rosuvastatin  Persistent A-fib Sick sinus syndrome status post pacemaker placement: -Monitored on telemetry.  Continued Eliquis.  Not on rate control medicine.  Severe AA status post TAVR -Reviewed Echo from 8/23  Severe MR: -Not a candidate for advanced treatment.  Continued medical management  Anxiety: -As per patient's daughter patient probably have anxiety issues -Recomended to follow-up with PCP outpatient   Discharge Diagnoses:  Acute hypoxemic respiratory failure Elevated troponin Chronic systolic CHF Hypertension Hyperlipidemia Persistent A-fib Sick sinus syndrome status post pacemaker placement Severe AS s/p TAVR Severe MR Coronary artery disease status post CABG x4 in 2006 Hypokalemia  Discharge Instructions  Discharge Instructions     Diet - low sodium heart healthy   Complete by: As directed    Increase activity slowly   Complete by: As directed    No dressing needed   Complete by: As directed       Allergies as of 02/19/2022       Reactions   Other Other (See Comments)   NO ACIDIC, TART< OR SPICY FOODS- DEVELOPS REFLUX OFTEN!! PATIENT HAS TROUBLE SWALLOWING TABLETS!!   Bactrim [sulfamethoxazole-trimethoprim] Other (See Comments)   "makes her feel funny" or "unsteady"   Amitriptyline Hcl Rash   Aspirin Hives, Swelling, Rash, Other (See Comments)   Body became swollen   Tape Other (See Comments)   SKIN IS VERY THIN- WILL TEAR EASILY!!   Zetia [ezetimibe] Rash        Medication List     TAKE these medications    acetaminophen 650 MG CR tablet Commonly known as: TYLENOL Take 650-1,300 mg by mouth 2 (two) times daily as needed for pain.   apixaban 2.5 MG Tabs tablet Commonly known as: Eliquis Take 1 tablet (2.5 mg total) by mouth 2 (  two) times daily.   BENEFIBER PO Take 1 Scoop by mouth daily as needed (fiber).   docusate sodium 100 MG capsule Commonly known as: COLACE Take 100 mg by mouth daily.   Ensure Take 237 mLs by mouth  every other day.   EYE DROPS OP Apply 2 drops to eye daily as needed (gritty eyes).   fluticasone 220 MCG/ACT inhaler Commonly known as: FLOVENT HFA INHALE 2 PUFFS INTO THE LUNGS TWICE DAILY   Linzess 72 MCG capsule Generic drug: linaclotide Take 1 capsule (72 mcg total) by mouth daily.   loratadine 10 MG tablet Commonly known as: CLARITIN Take 1 tablet (10 mg total) by mouth daily.   nitroGLYCERIN 0.4 MG SL tablet Commonly known as: NITROSTAT DISSOLVE 1 TABLET UNDER THE TONGUE EVERY 5 MINUTES AS NEEDED FOR CHEST PAIN   OVER THE COUNTER MEDICATION Take by mouth daily as needed (cough). Guaifenesin liquid PO 1 sip by mouth daily as needed for cough   pantoprazole 40 MG tablet Commonly known as: PROTONIX Take 1 tablet (40 mg total) by mouth 2 (two) times daily.   rosuvastatin 20 MG tablet Commonly known as: CRESTOR Take 1 tablet (20 mg total) by mouth daily.   Ventolin HFA 108 (90 Base) MCG/ACT inhaler Generic drug: albuterol INHALE 2 PUFFS INTO THE LUNGS EVERY 6 HOURS AS NEEDED FOR WHEEZING OR SHORTNESS OF BREATH What changed:  how much to take how to take this when to take this reasons to take this additional instructions   Vitamin D-3 25 MCG (1000 UT) Caps Take 1,000 Units by mouth daily.               Discharge Care Instructions  (From admission, onward)           Start     Ordered   02/19/22 0000  No dressing needed        02/19/22 1500            Follow-up Information     [facility], [facility] Follow up.   Specialty: Home Health Services Why: for resumption of [facility] services Contact information: [address] [place] [address] [phone]                Allergies  Allergen Reactions   Other Other (See Comments)    NO ACIDIC, TART< OR SPICY FOODS- DEVELOPS REFLUX OFTEN!!  PATIENT HAS TROUBLE SWALLOWING TABLETS!!   Bactrim [Sulfamethoxazole-Trimethoprim] Other (See Comments)    "makes her feel funny" or  "unsteady"    Amitriptyline Hcl Rash   Aspirin Hives, Swelling, Rash and Other (See Comments)    Body became swollen   Tape Other (See Comments)    SKIN IS VERY THIN- WILL TEAR EASILY!!   Zetia [Ezetimibe] Rash    Consultations: Cardiology   Procedures/Studies: DG Chest 2 View  Result Date: 02/18/2022 CLINICAL DATA:  Dyspnea EXAM: CHEST - 2 VIEW COMPARISON:  Radiograph 12/25/2021 FINDINGS: Hyperinflation. No focal consolidation, pleural effusion, or pneumothorax. No acute osseous abnormality. Small bilateral pleural effusions or pleural thickening. Stable cardiomediastinal silhouette. Aortic atherosclerotic calcification. Sternotomy. TAVR. Left chest wall pacemaker. No acute osseous abnormality. IMPRESSION: No active cardiopulmonary disease. Electronically Signed   By: Placido Sou M.D.   On: 02/18/2022 00:37      Subjective: Patient seen and examined.  Daughter at the bedside.  No chest pain.  No acute events overnight.  Discussed plan of care with patient's daughter.  She is comfortable for the discharge.  Patient  has follow-up appointment with cardiology on Wednesday.  I encouraged her to follow-up with PCP on Monday to discuss anxiety issues.  Discharge Exam: Vitals:   02/19/22 1059 02/19/22 1215  BP:  (!) 102/57  Pulse: 80 85  Resp:  19  Temp:  (!) 97.4 F (36.3 C)  SpO2: 93% 96%   Vitals:   02/19/22 0715 02/19/22 1050 02/19/22 1059 02/19/22 1215  BP: (!) 114/55   (!) 102/57  Pulse: 84 73 80 85  Resp: 19   19  Temp: (!) 97.3 F (36.3 C)   (!) 97.4 F (36.3 C)  TempSrc: Oral   Oral  SpO2: 100% 98% 93% 96%  Weight:      Height:        General: Pt is alert, awake, not in acute distress, on room air Cardiovascular: RRR, S1/S2 +, no rubs, no gallops Respiratory: CTA bilaterally, no wheezing, no rhonchi Abdominal: Soft, NT, ND, bowel sounds + Extremities: no edema, no cyanosis    The results of significant diagnostics from this hospitalization (including  imaging, microbiology, ancillary and laboratory) are listed below for reference.     Microbiology: No results found for this or any previous visit (from the past 240 hour(s)).   Labs: BNP (last 3 results) Recent Labs    12/26/21 0443 02/18/22 0015  BNP 291.9* 035.0*   Basic Metabolic Panel: Recent Labs  Lab 02/18/22 0015  NA 138  K 3.4*  CL 101  CO2 28  GLUCOSE 118*  BUN 7*  CREATININE 0.54  CALCIUM 9.4   Liver Function Tests: Recent Labs  Lab 02/18/22 0015  AST 23  ALT 14  ALKPHOS 36*  BILITOT 0.5  PROT 6.6  ALBUMIN 4.0   No results for input(s): "LIPASE", "AMYLASE" in the last 168 hours. No results for input(s): "AMMONIA" in the last 168 hours. CBC: Recent Labs  Lab 02/18/22 0015  WBC 7.9  NEUTROABS 6.0  HGB 13.6  HCT 41.4  MCV 92.8  PLT 166   Cardiac Enzymes: No results for input(s): "CKTOTAL", "CKMB", "CKMBINDEX", "TROPONINI" in the last 168 hours. BNP: Invalid input(s): "POCBNP" CBG: No results for input(s): "GLUCAP" in the last 168 hours. D-Dimer No results for input(s): "DDIMER" in the last 72 hours. Hgb A1c No results for input(s): "HGBA1C" in the last 72 hours. Lipid Profile No results for input(s): "CHOL", "HDL", "LDLCALC", "TRIG", "CHOLHDL", "LDLDIRECT" in the last 72 hours. Thyroid function studies No results for input(s): "TSH", "T4TOTAL", "T3FREE", "THYROIDAB" in the last 72 hours.  Invalid input(s): "FREET3" Anemia work up No results for input(s): "VITAMINB12", "FOLATE", "FERRITIN", "TIBC", "IRON", "RETICCTPCT" in the last 72 hours. Urinalysis    Component Value Date/Time   COLORURINE YELLOW 12/26/2021 2044   APPEARANCEUR CLOUDY (A) 12/26/2021 2044   LABSPEC 1.038 (H) 12/26/2021 2044   LABSPEC 1.010 01/08/2019 1526   PHURINE 5.0 12/26/2021 2044   GLUCOSEU NEGATIVE 12/26/2021 2044   HGBUR SMALL (A) 12/26/2021 2044   BILIRUBINUR NEGATIVE 12/26/2021 2044   BILIRUBINUR negative 01/08/2019 1526   BILIRUBINUR n 12/05/2011 [place] 12/26/2021 2044   PROTEINUR 30 (A) 12/26/2021 2044   UROBILINOGEN 0.2 05/19/2014 1601   NITRITE NEGATIVE 12/26/2021 2044   LEUKOCYTESUR NEGATIVE 12/26/2021 2044   Sepsis Labs Recent Labs  Lab 02/18/22 0015  WBC 7.9   Microbiology No results found for this or any previous visit (from the past 240 hour(s)).   Time coordinating discharge: Over 30 minutes  SIGNED:   Mckinley Jewel,  MD  [facility] 02/19/2022, 3:00 PM Pager   If 7PM-7AM, please contact night-coverage [email or web address]

## 2022-02-21 ENCOUNTER — Telehealth: Payer: Self-pay | Admitting: Family Medicine

## 2022-02-21 DIAGNOSIS — I69398 Other sequelae of cerebral infarction: Secondary | ICD-10-CM | POA: Diagnosis not present

## 2022-02-21 DIAGNOSIS — I5032 Chronic diastolic (congestive) heart failure: Secondary | ICD-10-CM | POA: Diagnosis not present

## 2022-02-21 DIAGNOSIS — K8062 Calculus of gallbladder and bile duct with acute cholecystitis without obstruction: Secondary | ICD-10-CM | POA: Diagnosis not present

## 2022-02-21 DIAGNOSIS — I110 Hypertensive heart disease with heart failure: Secondary | ICD-10-CM | POA: Diagnosis not present

## 2022-02-21 DIAGNOSIS — R29898 Other symptoms and signs involving the musculoskeletal system: Secondary | ICD-10-CM | POA: Diagnosis not present

## 2022-02-21 DIAGNOSIS — K851 Biliary acute pancreatitis without necrosis or infection: Secondary | ICD-10-CM | POA: Diagnosis not present

## 2022-02-21 NOTE — Telephone Encounter (Signed)
Physical therapist Clair Gulling called and wanted you to know [name] was in the hospital last week for shortness of breath and she was seen today by physical therapy and she had no symptoms, BP was 116/58 and she will be discharged next week. She also has an appointment next Wednesday with a specialist in cardiology. He provided a call back number [phone].

## 2022-02-23 ENCOUNTER — Ambulatory Visit: Payer: Medicare Other | Attending: Cardiology | Admitting: Cardiology

## 2022-02-23 ENCOUNTER — Encounter: Payer: Self-pay | Admitting: Cardiology

## 2022-02-23 VITALS — BP 102/52 | HR 90 | Ht 62.0 in

## 2022-02-23 DIAGNOSIS — I6523 Occlusion and stenosis of bilateral carotid arteries: Secondary | ICD-10-CM

## 2022-02-23 DIAGNOSIS — I4819 Other persistent atrial fibrillation: Secondary | ICD-10-CM | POA: Insufficient documentation

## 2022-02-23 DIAGNOSIS — D6869 Other thrombophilia: Secondary | ICD-10-CM | POA: Diagnosis not present

## 2022-02-23 MED ORDER — AMIODARONE HCL 200 MG PO TABS: 200.0000 mg | ORAL_TABLET | Freq: Every day | ORAL | 3 refills | Status: DC

## 2022-02-23 NOTE — Progress Notes (Signed)
Electrophysiology Office Note   Date:  02/23/2022   ID:  Lori, [name] [birth year], MRN [id number]  PCP:  Denita Lung, MD  Cardiologist:  Angelena Form Primary Electrophysiologist: Darletta Noblett Meredith Leeds, MD    No chief complaint on file.     History of Present Illness: [name] is a 88 y.o. female who is being seen today for the evaluation of atrial fibrillation at the request of Denita Lung, MD. Presenting today for electrophysiology evaluation.   She has a history significant for coronary artery disease status post CABG with maze in 2006, atrial fibrillation, prior stroke, PVD, aortic stenosis post TAVR in 2018, hypertension, hyperlipidemia.  She presented to the hospital with episode of syncope and was found to have significant pauses.  She is status post [facility] Jude dual-chamber pacemaker implanted 01/15/2021.  Today, denies symptoms of palpitations, chest pain, orthopnea, PND, lower extremity edema, claudication, dizziness, presyncope, syncope, bleeding, or neurologic sequela. The patient is tolerating medications without difficulties.  Main complaint today is shortness of breath.  She is short of breath during most of the day.  She does have atrial fibrillation, but she is in sinus rhythm today.  She gets quite anxious when she is short of breath, feeling like she Butch Otterson pass out. .  Past Medical History:  Diagnosis Date   Anxiety    Aortic Stenosis s/p TAVR    Echo 10/21: EF 55-60, no RWMA, mild asymmetric LVH, normal RVSF, RVSP 40.2 mmHg, mild MR, s/p TAVR with mean gradient 8.77mHg, no PVL   Arthritis    OSTEO   Aspirin allergy    on Plavix   Asthma    CAD (coronary artery disease)    a. s/p CABG 2006 with Cox-Maze procedure.   Carotid artery disease ([place]    a. s/p R CEA.   Diastolic dysfunction    Eczema    Fibromyalgia    GERD (gastroesophageal reflux disease)    Hiatal hernia    Hyperlipidemia    Hypertension    Kyphoscoliosis    Mitral regurgitation     [place]ys[place]    a. pt has h/o hematuria on Eliquis and has since refused anticoagulation   Pneumonia 2015 ?   Pulmonary regurgitation    Skin cancer of arm    Stroke ([facility]    Tricuspid regurgitation    Past Surgical History:  Procedure Laterality Date   ABDOMINAL HYSTERECTOMY  1976   CAROTID ENDARTERECTOMY  2010   CORONARY ANGIOPLASTY WITH STENT PLACEMENT     CORONARY ARTERY BYPASS GRAFT  01/2005   LIMA-D1; SVG-LAD; SVG-OM; SVG-PDA   ERCP N/A 12/28/2021   Procedure: ENDOSCOPIC RETROGRADE CHOLANGIOPANCREATOGRAPHY (ERCP);  Surgeon: HCarol Ada MD;  [place]  Service: Gastroenterology;  Laterality: N/A;   ESOPHAGOGASTRODUODENOSCOPY (EGD) WITH PROPOFOL N/A 03/19/2021   Procedure: ESOPHAGOGASTRODUODENOSCOPY (EGD) WITH PROPOFOL;  Surgeon: HCarol Ada MD;  [facility];  Service: Endoscopy;  Laterality: N/A;   EYE SURGERY     MULTIPLE EXTRACTIONS WITH ALVEOLOPLASTY  12/28/2016   Extraction of tooth #'s 2- 5,7-10, 12,13,17-20,and 22-29 with alveoloplasty and maxillary right and left lateral exostoses reductions   MULTIPLE EXTRACTIONS WITH ALVEOLOPLASTY N/A 12/28/2016   Procedure: Extraction of tooth #'s 2- 5,7-10, 12,13,17-20,and 22-29 with alveoloplasty and maxillary right and left lateral exostoses reductions;  Surgeon: KLenn Cal DDS;  [place]  Service: Oral Surgery;  Laterality: N/A;   PACEMAKER IMPLANT N/A 01/15/2021   Procedure: PACEMAKER IMPLANT;  Surgeon: Constance Haw, MD;  Location: [place] [facility];  Service: Cardiovascular;  Laterality: N/A;   REMOVAL OF STONES  12/28/2021   Procedure: REMOVAL OF STONES;  Surgeon: Carol Ada, MD;  Location: [place];  Service: Gastroenterology;;   RIGHT/LEFT HEART CATH AND CORONARY/GRAFT ANGIOGRAPHY N/A 10/27/2016   Procedure: Right/Left Heart Cath and Coronary/Graft Angiography;  Surgeon: Burnell Blanks, MD;  Location: [place] [facility];  Service:  Cardiovascular;  Laterality: N/A;   SAVORY DILATION N/A 03/19/2021   Procedure: SAVORY DILATION;  Surgeon: Carol Ada, MD;  [facility];  Service: Endoscopy;  Laterality: N/A;   SPHINCTEROTOMY  12/28/2021   Procedure: SPHINCTEROTOMY;  Surgeon: Carol Ada, MD;  Location: [place];  Service: Gastroenterology;;   TEE WITHOUT CARDIOVERSION N/A 01/03/2017   Procedure: TRANSESOPHAGEAL ECHOCARDIOGRAM (TEE);  Surgeon: Burnell Blanks, MD;  Location: [place];  Service: Open Heart Surgery;  Laterality: N/A;   TONSILLECTOMY     TRANSCATHETER AORTIC VALVE REPLACEMENT, TRANSFEMORAL N/A 01/03/2017   Procedure: TRANSCATHETER AORTIC VALVE REPLACEMENT, TRANSFEMORAL;  Surgeon: Burnell Blanks, MD;  Location: [place];  Service: Open Heart Surgery;  Laterality: N/A;   TUMOR REMOVAL       Current Outpatient Medications  Medication Sig Dispense Refill   acetaminophen (TYLENOL) 650 MG CR tablet Take 650-1,300 mg by mouth 2 (two) times daily as needed for pain.     amiodarone (PACERONE) 200 MG tablet Take 1 tablet (200 mg total) by mouth daily. 90 tablet 3   apixaban (ELIQUIS) 2.5 MG TABS tablet Take 1 tablet (2.5 mg total) by mouth 2 (two) times daily.     Carboxymethylcellulose Sodium (EYE DROPS OP) Apply 2 drops to eye daily as needed (gritty eyes).     Cholecalciferol (VITAMIN D-3) 25 MCG (1000 UT) CAPS Take 1,000 Units by mouth daily.     docusate sodium (COLACE) 100 MG capsule Take 100 mg by mouth daily.     Ensure (ENSURE) Take 237 mLs by mouth every other day.     fluticasone (FLOVENT HFA) 220 MCG/ACT inhaler INHALE 2 PUFFS INTO THE LUNGS TWICE DAILY 12 g 0   LINZESS 72 MCG capsule Take 1 capsule (72 mcg total) by mouth daily. 30 capsule 0   loratadine (CLARITIN) 10 MG tablet Take 1 tablet (10 mg total) by mouth daily. 30 tablet 0   nitroGLYCERIN (NITROSTAT) 0.4 MG SL tablet DISSOLVE 1 TABLET UNDER THE TONGUE EVERY 5 MINUTES AS NEEDED FOR CHEST PAIN 75 tablet 2   OVER THE  COUNTER MEDICATION Take by mouth daily as needed (cough). Guaifenesin liquid PO 1 sip by mouth daily as needed for cough     pantoprazole (PROTONIX) 40 MG tablet Take 1 tablet (40 mg total) by mouth 2 (two) times daily. 180 tablet 3   rosuvastatin (CRESTOR) 20 MG tablet Take 1 tablet (20 mg total) by mouth daily. 30 tablet 0   VENTOLIN HFA 108 (90 Base) MCG/ACT inhaler INHALE 2 PUFFS INTO THE LUNGS EVERY 6 HOURS AS NEEDED FOR WHEEZING OR SHORTNESS OF BREATH 18 g 0   Wheat Dextrin (BENEFIBER PO) Take 1 Scoop by mouth daily as needed (fiber).     No current facility-administered medications for this visit.    Allergies:   Other, Bactrim [sulfamethoxazole-trimethoprim], Amitriptyline hcl, Aspirin, Tape, and Zetia [ezetimibe]   Social History:  The patient  reports that she has never smoked. She quit smokeless tobacco use about 20 years ago.  Her smokeless tobacco use included snuff. She reports  that she does not drink alcohol and does not use drugs.   Family History:  The patient's family history includes Heart failure in her mother; Other in her father.   ROS:  Please see the history of present illness.   Otherwise, review of systems is positive for none.   All other systems are reviewed and negative.   PHYSICAL EXAM: VS:  BP (!) 102/52   Pulse 90   Ht '5\' 2"'$  (1.575 m)   SpO2 97%   BMI 14.60 kg/m  , BMI Body mass index is 14.6 kg/m. GEN: Well nourished, well developed, in no acute distress  HEENT: normal  Neck: no JVD, carotid bruits, or masses Cardiac: RRR; no murmurs, rubs, or gallops,no edema  Respiratory:  clear to auscultation bilaterally, normal work of breathing GI: soft, nontender, nondistended, + BS MS: no deformity or atrophy  Skin: warm and dry, device site well healed Neuro:  Strength and sensation are intact Psych: euthymic mood, full affect  EKG:  EKG is not ordered today. Personal review of the ekg ordered 02/18/22 shows atrial fibrillation, ventricular  paced  Personal review of the device interrogation today. Results in [place]: 12/28/2021: Magnesium 1.8 02/18/2022: ALT 14; B Natriuretic Peptide 185.7; BUN 7; Creatinine, Ser 0.54; Hemoglobin 13.6; Platelets 166; Potassium 3.4; Sodium 138    Lipid Panel     Component Value Date/Time   CHOL 151 11/13/2019 1529   TRIG 84 11/13/2019 1529   HDL 77 11/13/2019 1529   CHOLHDL 2.0 11/13/2019 1529   CHOLHDL 2.0 08/21/2015 0001   VLDL 17 08/21/2015 0001   LDLCALC 58 11/13/2019 1529     Wt Readings from Last 3 Encounters:  02/19/22 79 lb 12.9 oz (36.2 kg)  02/02/22 80 lb 6.4 oz (36.5 kg)  01/26/22 79 lb (35.8 kg)      Other studies Reviewed: Additional studies/ records that were reviewed today include: TTE 02/06/17  Review of the above records today demonstrates:  - Left ventricle: The cavity size was normal. Systolic function was   normal. The estimated ejection fraction was in the range of 60%   to 65%. Wall motion was normal; there were no regional wall   motion abnormalities. The study was not technically sufficient to   allow evaluation of LV diastolic dysfunction due to atrial   fibrillation. - Aortic valve: A TAVR bioprosthetic valve is present (23 mm   Edwards-SAPIEN). There was no regurgitation. Mean gradient (S): 8   mm Hg. Peak gradient (S): 19 mm Hg. - Aortic root: The aortic root was normal in size. - Mitral valve: Calcified annulus. Moderately thickened, moderately   calcified leaflets . There was mild regurgitation. - Left atrium: The atrium was normal in size. - Right ventricle: Systolic function was normal. - Tricuspid valve: There was moderate regurgitation. - Pulmonic valve: There was mild regurgitation. - Pulmonary arteries: Systolic pressure was mildly increased. PA   peak pressure: 38 mm Hg (S). - Inferior vena cava: The vessel was normal in size. - Pericardium, extracardiac: There was no pericardial effusion.  Holter 02/15/17 - personally  reviewed Sinus bradycardia with lowest heart rate 48 bpm Premature ventricular contractions Premature atrial contractions Transient second degree AV block.   ASSESSMENT AND PLAN:  1.  Persistent atrial fibrillation: Currently on Eliquis 5 mg twice daily.  CHA2DS2-VASc of 8.  Was having significant pauses and syncope.  Is status post [facility] Jude dual-chamber pacemaker implanted 01/15/2021.  Device functioning appropriately.  Her shortness of  breath could be due to her atrial fibrillation.  We Mckaela Howley start her on amiodarone 200 mg daily.  2.  Coronary artery disease: Status post CABG.  No current chest pain.  3.  Severe aortic stenosis: Status post TAVR.  Plan per primary cardiology.  4.  Secondary hypercoagulable state: Currently on Eliquis for atrial fibrillation as above.   Current medicines are reviewed at length with the patient today.   The patient does not have concerns regarding her medicines.  The following changes were made today: Start amiodarone  Labs/ tests ordered today include:  No orders of the defined types were placed in this encounter.     Disposition:   FU 3 months  Signed, Shahmeer Bunn Meredith Leeds, MD  02/23/2022 4:27 PM     [address] [place] [address] [phone] (office) [phone] (fax)

## 2022-02-23 NOTE — Patient Instructions (Signed)
Medication Instructions:  Your physician has recommended you make the following change in your medication:  1) START taking amiodarone 200 mg daily *If you need a refill on your cardiac medications before your next appointment, please call your pharmacy*  Follow-Up: At [facility], you and your health needs are our priority.  As part of our continuing mission to provide you with exceptional heart care, we have created designated Provider Care Teams.  These Care Teams include your primary Cardiologist (physician) and Advanced Practice Providers (APPs -  Physician Assistants and Nurse Practitioners) who all work together to provide you with the care you need, when you need it.  Your next appointment:   3 month(s)  The format for your next appointment:   In Person  Provider:   You will see one of the following Advanced Practice Providers on your designated Care Team:   Tommye Standard, [place] [facility] [place], [place]

## 2022-03-03 DIAGNOSIS — K851 Biliary acute pancreatitis without necrosis or infection: Secondary | ICD-10-CM | POA: Diagnosis not present

## 2022-03-03 DIAGNOSIS — I69398 Other sequelae of cerebral infarction: Secondary | ICD-10-CM | POA: Diagnosis not present

## 2022-03-03 DIAGNOSIS — K8062 Calculus of gallbladder and bile duct with acute cholecystitis without obstruction: Secondary | ICD-10-CM | POA: Diagnosis not present

## 2022-03-03 DIAGNOSIS — R29898 Other symptoms and signs involving the musculoskeletal system: Secondary | ICD-10-CM | POA: Diagnosis not present

## 2022-03-03 DIAGNOSIS — I110 Hypertensive heart disease with heart failure: Secondary | ICD-10-CM | POA: Diagnosis not present

## 2022-03-03 DIAGNOSIS — I5032 Chronic diastolic (congestive) heart failure: Secondary | ICD-10-CM | POA: Diagnosis not present

## 2022-03-04 ENCOUNTER — Telehealth: Payer: Self-pay | Admitting: Family Medicine

## 2022-03-04 NOTE — Telephone Encounter (Signed)
Jim with [facility] called and states that pt is being discharged. He states that per pt she has unresolved issues.  Pt states that she has intermittent shortness of breathe, was referred to Cardio but possibly anxiety.  Left great has redness distally and pain 7 out of 10. Will not see Dr. Amalia Hailey any more.   Chronic Hernia that flairs up twice a week usually after a big meal. States it gets to be the size of a softball and causes 7 ouit of 10 pain. Pt states nobody will do anything about it.   Clair Gulling can be reached at [phone].

## 2022-03-07 ENCOUNTER — Ambulatory Visit: Payer: Medicare Other | Admitting: Podiatry

## 2022-04-18 ENCOUNTER — Other Ambulatory Visit: Payer: Self-pay | Admitting: Family Medicine

## 2022-04-18 DIAGNOSIS — J45909 Unspecified asthma, uncomplicated: Secondary | ICD-10-CM

## 2022-04-18 DIAGNOSIS — I495 Sick sinus syndrome: Secondary | ICD-10-CM

## 2022-04-19 ENCOUNTER — Ambulatory Visit (INDEPENDENT_AMBULATORY_CARE_PROVIDER_SITE_OTHER): Payer: Medicare Other

## 2022-04-19 ENCOUNTER — Other Ambulatory Visit: Payer: Self-pay | Admitting: Family Medicine

## 2022-04-19 DIAGNOSIS — I495 Sick sinus syndrome: Secondary | ICD-10-CM

## 2022-04-19 NOTE — Telephone Encounter (Signed)
Is this okay to refill? 

## 2022-04-20 LAB — CUP PACEART REMOTE DEVICE CHECK
Battery Remaining Longevity: 91 mo
Battery Remaining Percentage: 88 %
Battery Voltage: 3.01 V
Brady Statistic AP VP Percent: 18 %
Brady Statistic AP VS Percent: 1 %
Brady Statistic AS VP Percent: 82 %
Brady Statistic AS VS Percent: 1 %
Brady Statistic RA Percent Paced: 4.3 %
Brady Statistic RV Percent Paced: 99 %
Date Time Interrogation Session: 20231128020023
Implantable Lead Connection Status: 753985
Implantable Lead Connection Status: 753985
Implantable Lead Implant Date: 20220826
Implantable Lead Implant Date: 20220826
Implantable Lead Location: 753859
Implantable Lead Location: 753860
Implantable Pulse Generator Implant Date: 20220826
Lead Channel Impedance Value: 430 Ohm
Lead Channel Impedance Value: 480 Ohm
Lead Channel Pacing Threshold Amplitude: 1 V
Lead Channel Pacing Threshold Amplitude: 1 V
Lead Channel Pacing Threshold Pulse Width: 0.5 ms
Lead Channel Pacing Threshold Pulse Width: 0.5 ms
Lead Channel Sensing Intrinsic Amplitude: 12 mV
Lead Channel Sensing Intrinsic Amplitude: 3 mV
Lead Channel Setting Pacing Amplitude: 2 V
Lead Channel Setting Pacing Amplitude: 2.5 V
Lead Channel Setting Pacing Pulse Width: 0.5 ms
Lead Channel Setting Sensing Sensitivity: 2 mV
Pulse Gen Model: 2272
Pulse Gen Serial Number: 3953466

## 2022-04-22 ENCOUNTER — Ambulatory Visit (INDEPENDENT_AMBULATORY_CARE_PROVIDER_SITE_OTHER): Payer: Medicare Other

## 2022-04-22 VITALS — Ht 61.0 in | Wt 84.0 lb

## 2022-04-22 DIAGNOSIS — Z0000 Encounter for general adult medical examination without abnormal findings: Secondary | ICD-10-CM

## 2022-04-22 NOTE — Patient Instructions (Signed)
[name] , Thank you for taking time to come for your Medicare Wellness Visit. I appreciate your ongoing commitment to your health goals. Please review the following plan we discussed and let me know if I can assist you in the future.   Screening recommendations/referrals: Colonoscopy: not required Mammogram: not required Bone Density: not required Recommended yearly ophthalmology/optometry visit for glaucoma screening and checkup Recommended yearly dental visit for hygiene and checkup  Vaccinations: Influenza vaccine: due Pneumococcal vaccine: completed 06/14/2013 Tdap vaccine: completed 11/09/2018, due 11/08/2028 Shingles vaccine: completed   Covid-19: 02/18/2021, 10/11/2019, 09/20/2019  Advanced directives: copy in chart  Conditions/risks identified: none  Next appointment: Follow up in one year for your annual wellness visit    Preventive Care 86 Years and Older, Female Preventive care refers to lifestyle choices and visits with your health care provider that can promote health and wellness. What does preventive care include? A yearly physical exam. This is also called an annual well check. Dental exams once or twice a year. Routine eye exams. Ask your health care provider how often you should have your eyes checked. Personal lifestyle choices, including: Daily care of your teeth and gums. Regular physical activity. Eating a healthy diet. Avoiding tobacco and drug use. Limiting alcohol use. Practicing safe sex. Taking low-dose aspirin every day. Taking vitamin and mineral supplements as recommended by your health care provider. What happens during an annual well check? The services and screenings done by your health care provider during your annual well check will depend on your age, overall health, lifestyle risk factors, and family history of disease. Counseling  Your health care provider may ask you questions about your: Alcohol use. Tobacco use. Drug use. Emotional  well-being. Home and relationship well-being. Sexual activity. Eating habits. History of falls. Memory and ability to understand (cognition). Work and work Statistician. Reproductive health. Screening  You may have the following tests or measurements: Height, weight, and BMI. Blood pressure. Lipid and cholesterol levels. These may be checked every 5 years, or more frequently if you are over 86 years old. Skin check. Lung cancer screening. You may have this screening every year starting at age 60 if you have a 30-pack-year history of smoking and currently smoke or have quit within the past 15 years. Fecal occult blood test (FOBT) of the stool. You may have this test every year starting at age 75. Flexible sigmoidoscopy or colonoscopy. You may have a sigmoidoscopy every 5 years or a colonoscopy every 10 years starting at age 30. Hepatitis C blood test. Hepatitis B blood test. Sexually transmitted disease (STD) testing. Diabetes screening. This is done by checking your blood sugar (glucose) after you have not eaten for a while (fasting). You may have this done every 1-3 years. Bone density scan. This is done to screen for osteoporosis. You may have this done starting at age 86. Mammogram. This may be done every 1-2 years. Talk to your health care provider about how often you should have regular mammograms. Talk with your health care provider about your test results, treatment options, and if necessary, the need for more tests. Vaccines  Your health care provider may recommend certain vaccines, such as: Influenza vaccine. This is recommended every year. Tetanus, diphtheria, and acellular pertussis (Tdap, Td) vaccine. You may need a Td booster every 10 years. Zoster vaccine. You may need this after age 86. Pneumococcal 13-valent conjugate (PCV13) vaccine. One dose is recommended after age 18. Pneumococcal polysaccharide (PPSV23) vaccine. One dose is recommended after age 86.  Talk to your  health care provider about which screenings and vaccines you need and how often you need them. This information is not intended to replace advice given to you by your health care provider. Make sure you discuss any questions you have with your health care provider. Document Released: 06/05/2015 Document Revised: 01/27/2016 Document Reviewed: 03/10/2015 Elsevier Interactive Patient Education  2017 [employer] Prevention in the Home Falls can cause injuries. They can happen to people of all ages. There are many things you can do to make your home safe and to help prevent falls. What can I do on the outside of my home? Regularly fix the edges of walkways and driveways and fix any cracks. Remove anything that might make you trip as you walk through a door, such as a raised step or threshold. Trim any bushes or trees on the path to your home. Use bright outdoor lighting. Clear any walking paths of anything that might make someone trip, such as rocks or tools. Regularly check to see if handrails are loose or broken. Make sure that both sides of any steps have handrails. Any raised decks and porches should have guardrails on the edges. Have any leaves, snow, or ice cleared regularly. Use sand or salt on walking paths during winter. Clean up any spills in your garage right away. This includes oil or grease spills. What can I do in the bathroom? Use night lights. Install grab bars by the toilet and in the tub and shower. Do not use towel bars as grab bars. Use non-skid mats or decals in the tub or shower. If you need to sit down in the shower, use a plastic, non-slip stool. Keep the floor dry. Clean up any water that spills on the floor as soon as it happens. Remove soap buildup in the tub or shower regularly. Attach bath mats securely with double-sided non-slip rug tape. Do not have throw rugs and other things on the floor that can make you trip. What can I do in the bedroom? Use night  lights. Make sure that you have a light by your bed that is easy to reach. Do not use any sheets or blankets that are too big for your bed. They should not hang down onto the floor. Have a firm chair that has side arms. You can use this for support while you get dressed. Do not have throw rugs and other things on the floor that can make you trip. What can I do in the kitchen? Clean up any spills right away. Avoid walking on wet floors. Keep items that you use a lot in easy-to-reach places. If you need to reach something above you, use a strong step stool that has a grab bar. Keep electrical cords out of the way. Do not use floor polish or wax that makes floors slippery. If you must use wax, use non-skid floor wax. Do not have throw rugs and other things on the floor that can make you trip. What can I do with my stairs? Do not leave any items on the stairs. Make sure that there are handrails on both sides of the stairs and use them. Fix handrails that are broken or loose. Make sure that handrails are as long as the stairways. Check any carpeting to make sure that it is firmly attached to the stairs. Fix any carpet that is loose or worn. Avoid having throw rugs at the top or bottom of the stairs. If you do have throw  rugs, attach them to the floor with carpet tape. Make sure that you have a light switch at the top of the stairs and the bottom of the stairs. If you do not have them, ask someone to add them for you. What else can I do to help prevent falls? Wear shoes that: Do not have high heels. Have rubber bottoms. Are comfortable and fit you well. Are closed at the toe. Do not wear sandals. If you use a stepladder: Make sure that it is fully opened. Do not climb a closed stepladder. Make sure that both sides of the stepladder are locked into place. Ask someone to hold it for you, if possible. Clearly mark and make sure that you can see: Any grab bars or handrails. First and last  steps. Where the edge of each step is. Use tools that help you move around (mobility aids) if they are needed. These include: Canes. Walkers. Scooters. Crutches. Turn on the lights when you go into a dark area. Replace any light bulbs as soon as they burn out. Set up your furniture so you have a clear path. Avoid moving your furniture around. If any of your floors are uneven, fix them. If there are any pets around you, be aware of where they are. Review your medicines with your doctor. Some medicines can make you feel dizzy. This can increase your chance of falling. Ask your doctor what other things that you can do to help prevent falls. This information is not intended to replace advice given to you by your health care provider. Make sure you discuss any questions you have with your health care provider. Document Released: 03/05/2009 Document Revised: 10/15/2015 Document Reviewed: 06/13/2014 Elsevier Interactive Patient Education  2017 [employer].

## 2022-04-22 NOTE — Progress Notes (Signed)
I connected with [name] today by telephone and verified that I am speaking with the correct person using two identifiers. Location patient: home Location provider: work Persons participating in the virtual visit: Girtrude Enslin, Coty Student (daughter), Glenna Durand LPN.   I discussed the limitations, risks, security and privacy concerns of performing an evaluation and management service by telephone and the availability of in person appointments. I also discussed with the patient that there may be a patient responsible charge related to this service. The patient expressed understanding and verbally consented to this telephonic visit.    Interactive audio and video telecommunications were attempted between this provider and patient, however failed, due to patient having technical difficulties OR patient did not have access to video capability.  We continued and completed visit with audio only.     Vital signs may be patient reported or missing.  Subjective:   [name] is a 88 y.o. female who presents for Medicare Annual (Subsequent) preventive examination.  Review of Systems     Cardiac Risk Factors include: advanced age (>88mn, >>16women);dyslipidemia     Objective:    Today's Vitals   04/22/22 1542  Weight: 84 lb (38.1 kg)  Height: '5\' 1"'$  (1.549 m)   Body mass index is 15.87 kg/m.     04/22/2022    3:50 PM 02/17/2022   11:46 PM 12/28/2021   12:03 PM 04/09/2021    3:54 PM 03/19/2021   10:21 AM 02/08/2021    2:05 PM 01/14/2021    4:43 PM  Advanced Directives  Does Patient Have a Medical Advance Directive? Yes No No Yes No No No  Type of Advance Directive Out of facility DNR (pink MOST or yellow form)   [place]     [place]?    Yes - validated most recent copy scanned in chart (See row information)     Would patient like information on creating a medical advance directive?     No - Patient  declined;No - Guardian declined No - Patient declined No - Patient declined    Current Medications (verified) Outpatient Encounter Medications as of 04/22/2022  Medication Sig   acetaminophen (TYLENOL) 650 MG CR tablet Take 650-1,300 mg by mouth 2 (two) times daily as needed for pain.   amiodarone (PACERONE) 200 MG tablet Take 1 tablet (200 mg total) by mouth daily.   apixaban (ELIQUIS) 2.5 MG TABS tablet Take 1 tablet (2.5 mg total) by mouth 2 (two) times daily.   Carboxymethylcellulose Sodium (EYE DROPS OP) Apply 2 drops to eye daily as needed (gritty eyes).   Cholecalciferol (VITAMIN D-3) 25 MCG (1000 UT) CAPS Take 1,000 Units by mouth daily.   docusate sodium (COLACE) 100 MG capsule Take 100 mg by mouth daily.   Ensure (ENSURE) Take 237 mLs by mouth every other day.   FLOVENT HFA 220 MCG/ACT inhaler INHALE 2 PUFFS INTO THE LUNGS TWICE DAILY   LINZESS 72 MCG capsule Take 1 capsule (72 mcg total) by mouth daily.   loratadine (CLARITIN) 10 MG tablet Take 1 tablet (10 mg total) by mouth daily.   nitroGLYCERIN (NITROSTAT) 0.4 MG SL tablet DISSOLVE 1 TABLET UNDER THE TONGUE EVERY 5 MINUTES AS NEEDED FOR CHEST PAIN   OVER THE COUNTER MEDICATION Take by mouth daily as needed (cough). Guaifenesin liquid PO 1 sip by mouth daily as needed for cough   pantoprazole (PROTONIX) 40 MG tablet Take 1 tablet (40 mg  total) by mouth 2 (two) times daily.   rosuvastatin (CRESTOR) 20 MG tablet TAKE 1 TABLET(20 MG) BY MOUTH DAILY   VENTOLIN HFA 108 (90 Base) MCG/ACT inhaler INHALE 2 PUFFS INTO THE LUNGS EVERY 6 HOURS AS NEEDED FOR WHEEZING OR SHORTNESS OF BREATH   Wheat Dextrin (BENEFIBER PO) Take 1 Scoop by mouth daily as needed (fiber).   No facility-administered encounter medications on file as of 04/22/2022.    Allergies (verified) Other, Bactrim [sulfamethoxazole-trimethoprim], Amitriptyline hcl, Aspirin, Tape, and Zetia [ezetimibe]   History: Past Medical History:  Diagnosis Date   Anxiety    Aortic  Stenosis s/p TAVR    Echo 10/21: EF 55-60, no RWMA, mild asymmetric LVH, normal RVSF, RVSP 40.2 mmHg, mild MR, s/p TAVR with mean gradient 8.74mHg, no PVL   Arthritis    OSTEO   Aspirin allergy    on Plavix   Asthma    CAD (coronary artery disease)    a. s/p CABG 2006 with Cox-Maze procedure.   Carotid artery disease ([place]    a. s/p R CEA.   Diastolic dysfunction    Eczema    Fibromyalgia    GERD (gastroesophageal reflux disease)    Hiatal hernia    Hyperlipidemia    Hypertension    Kyphoscoliosis    Mitral regurgitation    PAF (paroxysmal atrial fibrillation) ([place]    a. pt has h/o hematuria on Eliquis and has since refused anticoagulation   Pneumonia 2015 ?   Pulmonary regurgitation    Skin cancer of arm    Stroke ([facility]    Tricuspid regurgitation    Past Surgical History:  Procedure Laterality Date   ABDOMINAL HYSTERECTOMY  1976   CAROTID ENDARTERECTOMY  2010   CORONARY ANGIOPLASTY WITH STENT PLACEMENT     CORONARY ARTERY BYPASS GRAFT  01/2005   LIMA-D1; SVG-LAD; SVG-OM; SVG-PDA   ERCP N/A 12/28/2021   Procedure: ENDOSCOPIC RETROGRADE CHOLANGIOPANCREATOGRAPHY (ERCP);  Surgeon: HCarol Ada MD;  [place]  Service: Gastroenterology;  Laterality: N/A;   ESOPHAGOGASTRODUODENOSCOPY (EGD) WITH PROPOFOL N/A 03/19/2021   Procedure: ESOPHAGOGASTRODUODENOSCOPY (EGD) WITH PROPOFOL;  Surgeon: HCarol Ada MD;  [facility];  Service: Endoscopy;  Laterality: N/A;   EYE SURGERY     MULTIPLE EXTRACTIONS WITH ALVEOLOPLASTY  12/28/2016   Extraction of tooth #'s 2- 5,7-10, 12,13,17-20,and 22-29 with alveoloplasty and maxillary right and left lateral exostoses reductions   MULTIPLE EXTRACTIONS WITH ALVEOLOPLASTY N/A 12/28/2016   Procedure: Extraction of tooth #'s 2- 5,7-10, 12,13,17-20,and 22-29 with alveoloplasty and maxillary right and left lateral exostoses reductions;  Surgeon: KLenn Cal DDS;  [place]  Service: Oral Surgery;  Laterality: N/A;    PACEMAKER IMPLANT N/A 01/15/2021   Procedure: PACEMAKER IMPLANT;  Surgeon: CConstance Haw MD;  [place];  Service: Cardiovascular;  Laterality: N/A;   REMOVAL OF STONES  12/28/2021   Procedure: REMOVAL OF STONES;  Surgeon: HCarol Ada MD;  [place]  Service: Gastroenterology;;   RIGHT/LEFT HEART CATH AND CORONARY/GRAFT ANGIOGRAPHY N/A 10/27/2016   Procedure: Right/Left Heart Cath and Coronary/Graft Angiography;  Surgeon: MBurnell Blanks MD;  [place] [facility];  Service: Cardiovascular;  Laterality: N/A;   SAVORY DILATION N/A 03/19/2021   Procedure: SAVORY DILATION;  Surgeon: HCarol Ada MD;  [facility];  Service: Endoscopy;  Laterality: N/A;   SPHINCTEROTOMY  12/28/2021   Procedure: SPHINCTEROTOMY;  Surgeon: HCarol Ada MD;  [place]  Service: Gastroenterology;;   TEE WITHOUT CARDIOVERSION  N/A 01/03/2017   Procedure: TRANSESOPHAGEAL ECHOCARDIOGRAM (TEE);  Surgeon: Burnell Blanks, MD;  Location[place];  Service: Open Heart Surgery;  Laterality: N/A;   TONSILLECTOMY     TRANSCATHETER AORTIC VALVE REPLACEMENT, TRANSFEMORAL N/A 01/03/2017   Procedure: TRANSCATHETER AORTIC VALVE REPLACEMENT, TRANSFEMORAL;  Surgeon: Burnell Blanks, MD;  Location: [place];  Service: Open Heart Surgery;  Laterality: N/A;   TUMOR REMOVAL     Family History  Problem Relation Age of Onset   Heart failure Mother    Other Father    Social History   Socioeconomic History   Marital status: Widowed    Spouse name: Not on file   Number of children: 3   Years of education: Not on file   Highest education level: Not on file  Occupational History    Employer: RETIRED  Tobacco Use   Smoking status: Never   Smokeless tobacco: Former    Types: Snuff    Quit date: 05/23/2001   Tobacco comments:    Former Snuff (02/04/2021)  Vaping Use   Vaping Use: Never used  Substance and Sexual Activity   Alcohol use: No    Drug use: No   Sexual activity: Not Currently  Other Topics Concern   Not on file  Social History Narrative   Not on file   Social Determinants of Health   Financial Resource Strain: Low Risk  (04/22/2022)   Overall Financial Resource Strain (CARDIA)    Difficulty of Paying Living Expenses: Not hard at all  Food Insecurity: No Food Insecurity (04/22/2022)   Hunger Vital Sign    Worried About Running Out of Food in the Last Year: Never true    [place] in the Last Year: Never true  Transportation Needs: No Transportation Needs (04/22/2022)   PRAPARE - Hydrologist (Medical): No    Lack of Transportation (Non-Medical): No  Physical Activity: Inactive (04/22/2022)   Exercise Vital Sign    Days of Exercise per Week: 0 days    Minutes of Exercise per Session: 0 min  Stress: No Stress Concern Present (04/22/2022)   [place]    Feeling of Stress : Not at all  Social Connections: Not on file    Tobacco Counseling Counseling given: Not Answered Tobacco comments: Former Snuff (02/04/2021)   Clinical Intake:  Pre-visit preparation completed: Yes  Pain : No/denies pain     Nutritional Status: BMI <19  Underweight Nutritional Risks: None Diabetes: No  How often do you need to have someone help you when you read instructions, pamphlets, or other written materials from your doctor or pharmacy?: 1 - Never  Diabetic? no  Interpreter Needed?: No  Information entered by :: NAllen LPN   Activities of Daily Living    04/22/2022    3:54 PM  In your present state of health, do you have any difficulty performing the following activities:  Hearing? 1  Vision? 0  Difficulty concentrating or making decisions? 0  Walking or climbing stairs? 1  Dressing or bathing? 0  Doing errands, shopping? 1  Preparing Food and eating ? Y  Using the Toilet? Y  In the past six months, have you  accidently leaked urine? Y  Do you have problems with loss of bowel control? N  Managing your Medications? N  Managing your Finances? N  Housekeeping or managing your Housekeeping? N    Patient Care Team: Denita Lung,  MD as PCP - General (Family Medicine) Burnell Blanks, MD as PCP - Cardiology (Cardiology) Constance Haw, MD as PCP - Electrophysiology (Cardiology)  Indicate any recent Medical Services you may have received from other than [facility] providers in the past year (date may be approximate).     Assessment:   This is a routine wellness examination for [facility].  Hearing/Vision screen Vision Screening - Comments:: No regular eye exams  Dietary issues and exercise activities discussed: Current Exercise Habits: The patient does not participate in regular exercise at present   Goals Addressed             This Visit's Progress    Patient Stated       04/22/2022, no goals       Depression Screen    04/22/2022    3:52 PM 04/09/2021    3:56 PM 02/18/2021   11:29 AM 11/13/2019    2:33 PM 11/02/2018    9:45 AM 03/21/2018    8:15 AM 03/27/2017    2:17 PM  PHQ 2/9 Scores  PHQ - 2 Score 0 0 0 0 0 0 0  PHQ- 9 Score 3          Fall Risk    04/22/2022    3:51 PM 04/09/2021    3:55 PM 01/06/2021    2:58 PM 11/13/2019    2:32 PM 11/02/2018    9:44 AM  Fall Risk   Falls in the past year? 0 1 0 1 0  Comment  slid out of wheelchair     Number falls in past yr: 0 0 0 0   Injury with Fall? 0 0 0 0   Risk for fall due to : Impaired balance/gait;Impaired mobility;Medication side effect Impaired balance/gait;Impaired mobility;Medication side effect No Fall Risks    Follow up Falls prevention discussed;Education provided;Falls evaluation completed Falls evaluation completed;Education provided;Falls prevention discussed Falls evaluation completed      FALL RISK PREVENTION PERTAINING TO THE HOME:  Any stairs in or around the home? Yes  If so, are there any without  handrails? No  Home free of loose throw rugs in walkways, pet beds, electrical cords, etc? Yes  Adequate lighting in your home to reduce risk of falls? Yes   ASSISTIVE DEVICES UTILIZED TO PREVENT FALLS:  Life alert? No  Use of a cane, walker or w/c? Yes  Grab bars in the bathroom?  Does pan wash up Shower chair or bench in shower?  Does pan wash up Elevated toilet seat or a handicapped toilet? Yes   TIMED UP AND GO:  Was the test performed? No .      Cognitive Function:        04/22/2022    3:57 PM 04/09/2021    4:02 PM  6CIT Screen  What Year? 4 points 0 points  What month? 0 points 0 points  What time? 0 points 0 points  Count back from 20 4 points 0 points  Months in reverse 0 points 0 points  Repeat phrase 10 points 0 points  Total Score 18 points 0 points    Immunizations Immunization History  Administered Date(s) Administered   Fluad Quad(high Dose 65+) 02/18/2021   Influenza Split 03/28/2012, 04/28/2015   Influenza Whole 02/23/2009   Influenza, High Dose Seasonal PF 03/14/2013, 03/03/2017, 03/16/2018, 02/18/2019   Influenza-Unspecified 03/16/2018   Moderna Covid-19 Vaccine Bivalent Booster 91yr & up 02/18/2021   PFIZER(Purple Top)SARS-COV-2 Vaccination 09/20/2019, 10/11/2019   Pneumococcal Conjugate-13 06/14/2013  Pneumococcal Polysaccharide-23 12/22/2008   Tdap 10/11/2006, 07/14/2017, 11/09/2018   Zoster Recombinat (Shingrix) 09/10/2017, 12/12/2017   Zoster, Live 01/01/2009    TDAP status: Up to date  Flu Vaccine status: Due, Education has been provided regarding the importance of this vaccine. Advised may receive this vaccine at local pharmacy or Health Dept. Aware to provide a copy of the vaccination record if obtained from local pharmacy or Health Dept. Verbalized acceptance and understanding.  Pneumococcal vaccine status: Up to date  Covid-19 vaccine status: Completed vaccines  Qualifies for Shingles Vaccine? Yes   Zostavax completed Yes    Shingrix Completed?: Yes  Screening Tests Health Maintenance  Topic Date Due   INFLUENZA VACCINE  12/21/2021   COVID-19 Vaccine (4 - 2023-24 season) 01/21/2022   DTaP/Tdap/Td (4 - Td or Tdap) 11/08/2028   Pneumonia Vaccine 58+ Years old  Completed   Zoster Vaccines- Shingrix  Completed   HPV VACCINES  Aged Out   DEXA SCAN  Discontinued    Health Maintenance  Health Maintenance Due  Topic Date Due   INFLUENZA VACCINE  12/21/2021   COVID-19 Vaccine (4 - 2023-24 season) 01/21/2022    Colorectal cancer screening: No longer required.   Mammogram status: No longer required due to age.  Bone Density status: n/a  Lung Cancer Screening: (Low Dose CT Chest recommended if Age 64-80 years, 30 pack-year currently smoking OR have quit w/in 15years.) does not qualify.   Lung Cancer Screening Referral: no  Additional Screening:  Hepatitis C Screening: does not qualify;   Vision Screening: Recommended annual ophthalmology exams for early detection of glaucoma and other disorders of the eye. Is the patient up to date with their annual eye exam?  No  Who is the provider or what is the name of the office in which the patient attends annual eye exams? none If pt is not established with a provider, would they like to be referred to a provider to establish care? No .   Dental Screening: Recommended annual dental exams for proper oral hygiene  Community Resource Referral / Chronic Care Management: CRR required this visit?  No   CCM required this visit?  No      Plan:     I have personally reviewed and noted the following in the patient's chart:   Medical and social history Use of alcohol, tobacco or illicit drugs  Current medications and supplements including opioid prescriptions. Patient is not currently taking opioid prescriptions. Functional ability and status Nutritional status Physical activity Advanced directives List of other physicians Hospitalizations, surgeries, and  ER visits in previous 12 months Vitals Screenings to include cognitive, depression, and falls Referrals and appointments  In addition, I have reviewed and discussed with patient certain preventive protocols, quality metrics, and best practice recommendations. A written personalized care plan for preventive services as well as general preventive health recommendations were provided to patient.     Kellie Simmering, LPN   03/0/0923   Nurse Notes: none  Due to this being a virtual visit, the after visit summary with patients personalized plan was offered to patient via mail or my-chart. to pick up at office at next visit

## 2022-04-24 ENCOUNTER — Encounter (HOSPITAL_COMMUNITY): Payer: Self-pay | Admitting: *Deleted

## 2022-04-24 ENCOUNTER — Emergency Department (HOSPITAL_COMMUNITY)
Admission: EM | Admit: 2022-04-24 | Discharge: 2022-04-25 | Disposition: A | Discharge status: Home / Self Care | Payer: Medicare Other | Attending: Emergency Medicine | Admitting: Emergency Medicine

## 2022-04-24 ENCOUNTER — Other Ambulatory Visit: Payer: Self-pay

## 2022-04-24 DIAGNOSIS — Z7901 Long term (current) use of anticoagulants: Secondary | ICD-10-CM | POA: Insufficient documentation

## 2022-04-24 DIAGNOSIS — L03116 Cellulitis of left lower limb: Secondary | ICD-10-CM

## 2022-04-24 DIAGNOSIS — L03032 Cellulitis of left toe: Secondary | ICD-10-CM | POA: Diagnosis not present

## 2022-04-24 DIAGNOSIS — M79675 Pain in left toe(s): Secondary | ICD-10-CM | POA: Diagnosis present

## 2022-04-24 DIAGNOSIS — B351 Tinea unguium: Secondary | ICD-10-CM

## 2022-04-24 NOTE — ED Provider Triage Note (Signed)
Emergency Medicine Provider Triage Evaluation Note  [name] , a 88 y.o. female  was evaluated in triage.  Pt complains of ingrown toenail. Hx of same.   Review of Systems  Positive: Ingrown toenail Negative: fever  Physical Exam  BP (!) 159/47 (BP Location: Right Arm)   Pulse 62   Temp 98.8 F (37.1 C)   Resp 16   Ht '5\' 1"'$  (1.549 m)   Wt 38.1 kg   SpO2 98%   BMI 15.87 kg/m  Gen:   Awake, no distress   Resp:  Normal effort  MSK:   Moves extremities without difficulty  Other:  Left great toe nail ingrown distally with area of erythema and mild induration. No red tracking. No open wounds  Medical Decision Making  Medically screening exam initiated at 3:53 PM.  Appropriate orders placed.  [name] was informed that the remainder of the evaluation will be completed by another provider, this initial triage assessment does not replace that evaluation, and the importance of remaining in the ED until their evaluation is complete.     Kateri Plummer, PA-C 04/24/22 1554

## 2022-04-24 NOTE — ED Triage Notes (Signed)
The pt has an ingrown toenali lt great toe this comes and goes  she thinks the toe is worse

## 2022-04-25 ENCOUNTER — Telehealth: Payer: Self-pay

## 2022-04-25 ENCOUNTER — Telehealth: Payer: Self-pay | Admitting: Podiatry

## 2022-04-25 MED ORDER — CEPHALEXIN 500 MG PO CAPS: 500.0000 mg | ORAL_CAPSULE | Freq: Four times a day (QID) | ORAL | 0 refills | Status: DC

## 2022-04-25 MED ORDER — CEPHALEXIN 250 MG PO CAPS
1000.0000 mg | ORAL_CAPSULE | Freq: Once | ORAL | Status: AC
  Administered 2022-04-25: 1000 mg via ORAL
  Filled 2022-04-25: qty 4

## 2022-04-25 NOTE — Telephone Encounter (Signed)
Left message on voicemail for patient to call back to schedule follow up appt with Dr Amalia Hailey

## 2022-04-25 NOTE — ED Notes (Signed)
ED Provider at bedside. 

## 2022-04-25 NOTE — Telephone Encounter (Signed)
Transition Care Management Unsuccessful Follow-up Telephone Call  Date of discharge and from where:  [name] 04/25/22  Attempts:  1st Attempt  Reason for unsuccessful TCM follow-up call:  Left voice message

## 2022-04-25 NOTE — ED Provider Notes (Signed)
[facility] EMERGENCY DEPARTMENT Provider Note   CSN: [id number] Arrival date & time: 04/24/22  1436     History  Chief Complaint  Patient presents with   Nail Problem    [name] is a 88 y.o. female.  88 year old female who presents with her daughter secondary to toe pain.  She states she had ingrown toenail in her left great toe multiple times in the past.  She has had multiple toenail extractions and is frustrated that it has come back again.  She wears tight fitting narrow flat shoes.  No fevers, rapid spread or other associated symptoms.        Home Medications Prior to Admission medications   Medication Sig Start Date End Date Taking? Authorizing Provider  acetaminophen (TYLENOL) 650 MG CR tablet Take 650-1,300 mg by mouth 2 (two) times daily as needed for pain.    [provider]  amiodarone (PACERONE) 200 MG tablet Take 1 tablet (200 mg total) by mouth daily. 02/23/22   Camnitz, Ocie Doyne, MD  apixaban (ELIQUIS) 2.5 MG TABS tablet Take 1 tablet (2.5 mg total) by mouth 2 (two) times daily. 01/03/22   Lavina Hamman, MD  Carboxymethylcellulose Sodium (EYE DROPS OP) Apply 2 drops to eye daily as needed (gritty eyes).    [provider]  Cholecalciferol (VITAMIN D-3) 25 MCG (1000 UT) CAPS Take 1,000 Units by mouth daily.    [provider]  docusate sodium (COLACE) 100 MG capsule Take 100 mg by mouth daily.    [provider]  Ensure (ENSURE) Take 237 mLs by mouth every other day.    [provider]  FLOVENT HFA 220 MCG/ACT inhaler INHALE 2 PUFFS INTO THE LUNGS TWICE DAILY 04/19/22   Denita Lung, MD  LINZESS 72 MCG capsule Take 1 capsule (72 mcg total) by mouth daily. 02/10/21   Medina-Vargas, Monina C, NP  loratadine (CLARITIN) 10 MG tablet Take 1 tablet (10 mg total) by mouth daily. 02/10/21   Medina-Vargas, Monina C, NP  nitroGLYCERIN (NITROSTAT) 0.4 MG SL tablet DISSOLVE 1 TABLET UNDER THE TONGUE EVERY  5 MINUTES AS NEEDED FOR CHEST PAIN 11/01/21   Burnell Blanks, MD  OVER THE COUNTER MEDICATION Take by mouth daily as needed (cough). Guaifenesin liquid PO 1 sip by mouth daily as needed for cough    [provider]  pantoprazole (PROTONIX) 40 MG tablet Take 1 tablet (40 mg total) by mouth 2 (two) times daily. 01/26/22   Denita Lung, MD  rosuvastatin (CRESTOR) 20 MG tablet TAKE 1 TABLET(20 MG) BY MOUTH DAILY 04/19/22   Denita Lung, MD  VENTOLIN HFA 108 [phone] Base) MCG/ACT inhaler INHALE 2 PUFFS INTO THE LUNGS EVERY 6 HOURS AS NEEDED FOR WHEEZING OR SHORTNESS OF BREATH 07/15/21   Denita Lung, MD  Wheat Dextrin (BENEFIBER PO) Take 1 Scoop by mouth daily as needed (fiber).    [provider]      Allergies    Other, Bactrim [sulfamethoxazole-trimethoprim], Amitriptyline hcl, Aspirin, Tape, and Zetia [ezetimibe]    Review of Systems   Review of Systems  Physical Exam Updated Vital Signs BP (!) 145/71 (BP Location: Right Arm)   Pulse 68   Temp 98.8 F (37.1 C) (Oral)   Resp 18   Ht '5\' 1"'$  (1.549 m)   Wt 38.1 kg   SpO2 95%   BMI 15.87 kg/m  Physical Exam Vitals and nursing note reviewed.  Constitutional:  Appearance: She is well-developed.  HENT:     Head: Normocephalic and atraumatic.     Mouth/Throat:     Mouth: Mucous membranes are moist.     Pharynx: Oropharynx is clear.  Eyes:     Pupils: Pupils are equal, round, and reactive to light.  Cardiovascular:     Rate and Rhythm: Normal rate and regular rhythm.  Pulmonary:     Effort: No respiratory distress.     Breath sounds: No stridor.  Abdominal:     General: Abdomen is flat. There is no distension.  Musculoskeletal:        General: Normal range of motion.     Cervical back: Normal range of motion.  Skin:    General: Skin is warm and dry.     Comments: Likely onychomycosis with associated distal cellulitis.  No abscess, deep infection.  Low suspicion for bony infection.  Not clear  whether she actually has an ingrown toenail or not but her toenail is quite deformed.  Neurological:     General: No focal deficit present.     Mental Status: She is alert.     ED Results / Procedures / Treatments   Labs (all labs ordered are listed, but only abnormal results are displayed) Labs Reviewed - No data to display  EKG None  Radiology No results found.  Procedures Procedures    Medications Ordered in ED Medications  cephALEXin (KEFLEX) capsule 1,000 mg (has no administration in time range)    ED Course/ Medical Decision Making/ A&P                           Medical Decision Making Risk Prescription drug management.   Patient definitely has cellulitis of her toe we will start antibiotics for that.  She also has likely fungal infection of her toenail will need antifungal for that but ultimately really needs podiatry follow-up for better fitting shoes and definitive management of her infection.  It is unclear whether she has an ingrown toenail at this time.  No indication for imaging or labs.  Final Clinical Impression(s) / ED Diagnoses Final diagnoses:  None    Rx / DC Orders ED Discharge Orders     None         [place] Joshi, Corene Cornea, MD 04/25/22 [phone]

## 2022-04-26 ENCOUNTER — Telehealth: Payer: Self-pay

## 2022-04-26 NOTE — Telephone Encounter (Signed)
Transition Care Management Unsuccessful Follow-up Telephone Call  Date of discharge and from [name]  Attempts:  2nd Attempt  Reason for unsuccessful TCM follow-up call:  Left voice message

## 2022-04-27 ENCOUNTER — Telehealth: Payer: Self-pay

## 2022-04-27 NOTE — Telephone Encounter (Signed)
Transition Care Management Unsuccessful Follow-up Telephone Call  Date of discharge and from [name] 04[facility]25[facility]22  Attempts:  3rd Attempt  Reason for unsuccessful TCM follow-up call:  Left voice message Letter will be mailed to the pt.

## 2022-05-18 NOTE — Progress Notes (Signed)
Remote pacemaker transmission.   

## 2022-05-20 ENCOUNTER — Other Ambulatory Visit: Payer: Self-pay

## 2022-05-20 DIAGNOSIS — I4891 Unspecified atrial fibrillation: Secondary | ICD-10-CM

## 2022-05-20 MED ORDER — APIXABAN 2.5 MG PO TABS: 2.5000 mg | ORAL_TABLET | Freq: Two times a day (BID) | ORAL | 5 refills | Status: DC

## 2022-05-20 NOTE — Telephone Encounter (Signed)
Prescription refill request for Eliquis received. Indication:afib Last office visit:10/23 Scr:0.5 Age: 88 Weight:38.1  kg  Prescription refilled

## 2022-05-27 ENCOUNTER — Telehealth: Payer: Self-pay | Admitting: Family Medicine

## 2022-05-27 DIAGNOSIS — I5022 Chronic systolic (congestive) heart failure: Secondary | ICD-10-CM

## 2022-05-27 NOTE — Telephone Encounter (Signed)
TCF pt's daughter to ask about the Palliative program stating she was told that the Palliative Program was trying to coordinate with pt's PCP but never heard back.  She had received a letter stating that if pt's acute illness progressed that she could contact Manufacturing engineer for a reassessment. Explained that the program had been reorganized to get to those patients most in need.  She states that her mother has maintained her weight within 4-5 lbs.  Advised that if she began sleeping more, eating/drinking consistently less with 10% weight loss, heart failure with resting CP or dyspnea that pt may qualify for Hospice care to assist with symptom management.  Encouraged her to call again if she exhibited those symptoms to allow for Hospice services to support both her and the patient.  She states she has difficulty understanding because she gets different answers from different providers about how to best care for the patient and what is wrong with her.  Encouraged her to voice her confusion with providers in order to better understand what was being done and why.  Damaris Hippo FNP-C

## 2022-05-29 ENCOUNTER — Other Ambulatory Visit: Payer: Self-pay | Admitting: Family Medicine

## 2022-05-29 DIAGNOSIS — I495 Sick sinus syndrome: Secondary | ICD-10-CM

## 2022-05-29 DIAGNOSIS — J45909 Unspecified asthma, uncomplicated: Secondary | ICD-10-CM

## 2022-05-30 ENCOUNTER — Other Ambulatory Visit: Payer: Self-pay | Admitting: Family Medicine

## 2022-05-30 DIAGNOSIS — I495 Sick sinus syndrome: Secondary | ICD-10-CM

## 2022-06-04 NOTE — Progress Notes (Deleted)
Cardiology Office Note Date:  06/04/2022  Patient ID:  [name], [name] [birth year], MRN [id number] PCP:  Denita Lung, MD  Cardiologist:  Lauree Chandler, MD Electrophysiologist: Constance Haw, MD  ***refresh   Chief Complaint: ***  History of Present Illness: [name] is a 89 y.o. female with PMH notable for CAD s/p CABG and Maze, persistent Afib, CVA, Aortic stenosis s/p TAVR, VHD, HTN, SND s/p PM; seen today for [name], MD for routine electrophysiology followup. Since last being seen in our clinic the patient reports doing ***.   She saw Dr. Curt Bears 02/2022 c/o SOB, anxious and presyncope w SOB.  she denies chest pain, palpitations, dyspnea, PND, orthopnea, nausea, vomiting, dizziness, syncope, edema, weight gain, or early satiety.    *** SOB burden *** af symptoms - palpitations *** bleeding concerns   Device Information: St.J dual-chamber PPM, imp 12/2020; dx SND  AAD History: Amiodarone, started 02/2022  Past Medical History:  Diagnosis Date   Anxiety    Aortic Stenosis s/p TAVR    Echo 10/21: EF 55-60, no RWMA, mild asymmetric LVH, normal RVSF, RVSP 40.2 mmHg, mild MR, s/p TAVR with mean gradient 8.66mHg, no PVL   Arthritis    OSTEO   Aspirin allergy    on Plavix   Asthma    CAD (coronary artery disease)    a. s/p CABG 2006 with Cox-Maze procedure.   Carotid artery disease ([place]    a. s/p R CEA.   Diastolic dysfunction    Eczema    Fibromyalgia    GERD (gastroesophageal reflux disease)    Hiatal hernia    Hyperlipidemia    Hypertension    Kyphoscoliosis    Mitral regurgitation    PAF ([place]ysmal [place] fibrillation) ([place]    a. pt has h/o hematuria on Eliquis and has since refused anticoagulation   Pneumonia 2015 ?   Pulmonary regurgitation    Skin cancer of arm    Stroke ([facility]    Tricuspid regurgitation     Past Surgical History:  Procedure Laterality Date   ABDOMINAL HYSTERECTOMY  1976   CAROTID ENDARTERECTOMY   2010   CORONARY ANGIOPLASTY WITH STENT PLACEMENT     CORONARY ARTERY BYPASS GRAFT  01/2005   LIMA-D1; SVG-LAD; SVG-OM; SVG-PDA   ERCP N/A 12/28/2021   Procedure: ENDOSCOPIC RETROGRADE CHOLANGIOPANCREATOGRAPHY (ERCP);  Surgeon: HCarol Ada MD;  [place]  Service: Gastroenterology;  Laterality: N/A;   ESOPHAGOGASTRODUODENOSCOPY (EGD) WITH PROPOFOL N/A 03/19/2021   Procedure: ESOPHAGOGASTRODUODENOSCOPY (EGD) WITH PROPOFOL;  Surgeon: HCarol Ada MD;  [facility];  Service: Endoscopy;  Laterality: N/A;   EYE SURGERY     MULTIPLE EXTRACTIONS WITH ALVEOLOPLASTY  12/28/2016   Extraction of tooth #'s 2- 5,7-10, 12,13,17-20,and 22-29 with alveoloplasty and maxillary right and left lateral exostoses reductions   MULTIPLE EXTRACTIONS WITH ALVEOLOPLASTY N/A 12/28/2016   Procedure: Extraction of tooth #'s 2- 5,7-10, 12,13,17-20,and 22-29 with alveoloplasty and maxillary right and left lateral exostoses reductions;  Surgeon: KLenn Cal DDS;  [place] [facility]  Service: Oral Surgery;  Laterality: N/A;   PACEMAKER IMPLANT N/A 01/15/2021   Procedure: PACEMAKER IMPLANT;  Surgeon: CConstance Haw MD;  [place] [facility];  Service: Cardiovascular;  Laterality: N/A;   REMOVAL OF STONES  12/28/2021   Procedure: REMOVAL OF STONES;  Surgeon: HCarol Ada MD;  [place] [facility]  Service: Gastroenterology;;   RIGHT/LEFT HEART CATH AND CORONARY/GRAFT ANGIOGRAPHY N/A 10/27/2016   Procedure: Right/Left Heart Cath  and Coronary/Graft Angiography;  Surgeon: Burnell Blanks, MD;  Location: [place] [facility];  Service: Cardiovascular;  Laterality: N/A;   SAVORY DILATION N/A 03/19/2021   Procedure: SAVORY DILATION;  Surgeon: Carol Ada, MD;  Location: [facility];  Service: Endoscopy;  Laterality: N/A;   SPHINCTEROTOMY  12/28/2021   Procedure: SPHINCTEROTOMY;  Surgeon: Carol Ada, MD;  Location: [place];  Service: Gastroenterology;;   TEE WITHOUT  CARDIOVERSION N/A 01/03/2017   Procedure: TRANSESOPHAGEAL ECHOCARDIOGRAM (TEE);  Surgeon: Burnell Blanks, MD;  Location: [place];  Service: Open Heart Surgery;  Laterality: N/A;   TONSILLECTOMY     TRANSCATHETER AORTIC VALVE REPLACEMENT, TRANSFEMORAL N/A 01/03/2017   Procedure: TRANSCATHETER AORTIC VALVE REPLACEMENT, TRANSFEMORAL;  Surgeon: Burnell Blanks, MD;  Location: [place];  Service: Open Heart Surgery;  Laterality: N/A;   TUMOR REMOVAL      Current Outpatient Medications  Medication Instructions   acetaminophen (TYLENOL) 650-1,300 mg, Oral, 2 times daily PRN   amiodarone (PACERONE) 200 mg, Oral, Daily   apixaban (ELIQUIS) 2.5 mg, Oral, 2 times daily   Carboxymethylcellulose Sodium (EYE DROPS OP) 2 drops, Ophthalmic, Daily PRN   cephALEXin (KEFLEX) 500 mg, Oral, 4 times daily   docusate sodium (COLACE) 100 mg, Oral, Daily   Ensure (ENSURE) 237 mLs, Oral, Every other day   FLOVENT HFA 220 MCG/ACT inhaler 2 puffs, Inhalation, 2 times daily   Linzess 72 mcg, Oral, Daily   loratadine (CLARITIN) 10 mg, Oral, Daily,     nitroGLYCERIN (NITROSTAT) 0.4 MG SL tablet DISSOLVE 1 TABLET UNDER THE TONGUE EVERY 5 MINUTES AS NEEDED FOR CHEST PAIN   OVER THE COUNTER MEDICATION Oral, Daily PRN, Guaifenesin liquid PO<BR>1 sip by mouth daily as needed for cough   pantoprazole (PROTONIX) 40 mg, Oral, 2 times daily   rosuvastatin (CRESTOR) 20 MG tablet TAKE 1 TABLET(20 MG) BY MOUTH DAILY   VENTOLIN HFA 108 (90 Base) MCG/ACT inhaler INHALE 2 PUFFS INTO THE LUNGS EVERY 6 HOURS AS NEEDED FOR WHEEZING OR SHORTNESS OF BREATH   Vitamin D-3 1,000 Units, Oral, Daily   Wheat Dextrin (BENEFIBER PO) 1 Scoop, Oral, Daily PRN      Social History:  The patient  reports that she has never smoked. She quit smokeless tobacco use about 21 years ago.  Her smokeless tobacco use included snuff. She reports that she does not drink alcohol and does not use drugs.   Family History:  *** include only if  pertinent The patient's family history includes Heart failure in her mother; Other in her father.***  ROS:  Please see the history of present illness. All other systems are reviewed and otherwise negative.   PHYSICAL EXAM: *** VS:  There were no vitals taken for this visit. BMI: There is no height or weight on file to calculate BMI.  GEN- The patient is well appearing, alert and oriented x 3 today.   HEENT: normocephalic, atraumatic; sclera clear, conjunctiva pink; hearing intact; oropharynx clear; neck supple, no JVP Lungs- Clear to ausculation bilaterally, normal work of breathing.  No wheezes, rales, rhonchi Heart- {Blank single:19197::"Regular","Irregularly irregular"} rate and rhythm, no murmurs, rubs or gallops, PMI not laterally displaced GI- soft, non-tender, non-distended, bowel sounds present, no hepatosplenomegaly Extremities- {EDEMA LEVEL:28147::"No"} peripheral edema. no clubbing or cyanosis; DP/PT/radial pulses 2+ bilaterally MS- no significant deformity or atrophy Skin- warm and dry, no rash or lesion, *** device pocket well-healed Psych- euthymic mood, full affect Neuro- strength and sensation are intact   Device interrogation done today and  reviewed by myself:  Battery *** Lead thresholds, impedence, sensing stable *** *** episodes *** changes made today  EKG {ACTION; IS/IS GI:[id number] ordered. Personal review of EKG from {Blank single:19197::"today","***"} shows:  ***  Recent Labs: 12/28/2021: Magnesium 1.8 02/18/2022: ALT 14; B Natriuretic Peptide 185.7; BUN 7; Creatinine, Ser 0.54; Hemoglobin 13.6; Platelets 166; Potassium 3.4; Sodium 138  No results found for requested labs within last 365 days.   CrCl cannot be calculated (Patient's most recent lab result is older than the maximum 21 days allowed.).   Wt Readings from Last 3 Encounters:  04/24/22 83 lb 15.9 oz (38.1 kg)  04/22/22 84 lb (38.1 kg)  02/19/22 79 lb 12.9 oz (36.2 kg)     Additional studies  reviewed include: Previous EP, cardiology notes.   TTE 12/28/21  1. Left ventricular ejection fraction, by estimation, is 40 to 45%. The left ventricle has mildly decreased function. The left ventricle demonstrates regional wall motion abnormalities (see scoring diagram/findings for description). Left ventricular diastolic function could not be evaluated. There is severe hypokinesis of the left ventricular, basal-mid inferolateral wall.   2. Right ventricular systolic function is normal. The right ventricular size is normal. There is moderately elevated pulmonary artery systolic pressure. The estimated right ventricular systolic pressure is [id number] mmHg.   3. Left atrial size was mildly dilated.   4. Right atrial size was mildly dilated.   5. Multiple jets of mitral insufficiency are seen. The dominant jet is eccentric and posteriorly directed. A centrally directed jet is also seen. The cumulative degree of regurgitation is probably severe. The mitral valve is myxomatous. Severe mitral valve regurgitation. There is holosystolic prolapse of both leaflets of the mitral valve. The mean mitral valve gradient is 4.0 mmHg.   6. The tricuspid valve is myxomatous. Tricuspid valve regurgitation is  moderate.   7. The aortic valve has been repaired/replaced. Aortic valve regurgitation is trivial. There is a 23 mm Sapien prosthetic (TAVR) valve present in the aortic position. Procedure Date: 01/03/2017. Aortic valve mean gradient measures 7.7 mmHg. Aortic valve Vmax measures 1.96 m/s. Aortic valve acceleration time measures 50 msec.   8. Pulmonic valve regurgitation is moderate.   9. The inferior vena cava is dilated in size with <50% respiratory variability, suggesting right atrial pressure of 15 mmHg.    ASSESSMENT AND PLAN:  #) persistent Afib #) Hypercoag d/t AFib  CHA2DS2-VASc Score = 8 [CHF History: 1, HTN History: 1, Diabetes History: 0, Stroke History: 2, Vascular Disease History: 1, Age Score: 2, Gender  Score: 1].  Therefore, the patient's annual risk of stroke is 10.8 %.    OAC - eliquis 2.87m BID, dose appropriately reduced  {Confirm score is correct.  If not, click here to update score.  REFRESH note.  :1}     #) SND s/p PPM    Current medicines are reviewed at length with the patient today.   The patient {ACTIONS; HAS/DOES NOT HAVE:19233} concerns regarding her medicines.  The following changes were made today:  {NONE DEFAULTED:18576}  Labs/ tests ordered today include: *** No orders of the defined types were placed in this encounter.    Disposition: Follow up with {EPMDS:28135} in {EPFOLLOW UP:28173}   Signed, SMamie Levers NP  06/04/22 5:31 PM   [facility] [address]  [phone](office)  [phone](fax)

## 2022-06-06 ENCOUNTER — Encounter: Payer: Medicare Other | Admitting: Physician Assistant

## 2022-06-06 DIAGNOSIS — I495 Sick sinus syndrome: Secondary | ICD-10-CM

## 2022-06-06 DIAGNOSIS — I4819 Other persistent atrial fibrillation: Secondary | ICD-10-CM

## 2022-06-06 DIAGNOSIS — I5022 Chronic systolic (congestive) heart failure: Secondary | ICD-10-CM

## 2022-06-06 DIAGNOSIS — Z950 Presence of cardiac pacemaker: Secondary | ICD-10-CM

## 2022-06-21 ENCOUNTER — Other Ambulatory Visit: Payer: Self-pay | Admitting: Family Medicine

## 2022-06-21 DIAGNOSIS — I495 Sick sinus syndrome: Secondary | ICD-10-CM

## 2022-06-24 ENCOUNTER — Encounter: Payer: Medicare Other | Admitting: Physician Assistant

## 2022-06-24 NOTE — Progress Notes (Deleted)
Cardiology Office Note Date:  06/24/2022  Patient ID:  [name], [name] 04/01/1929, MRN [id number] PCP:  Denita Lung, MD  Cardiologist:  Dr. Angelena Form Electrophysiologist: Dr. Curt Bears  ***refresh   Chief Complaint: *** 3 mo  History of Present Illness: [name] is a 89 y.o. female with history of CAD (CABG 2006), AFib (h/o MAZE, 2006), stroke, PVD (s/p right CEA), fibromyalgia, VHD (s/p TAVR 2018), SSSx w/PPM.  She saw Elwyn Reach, NP 12/2021 with increased SOB, her a/c was not working, not sleeping well. , not found by exam to be volume OL, likely multifactorial with severe MR, increased Afib burden.  Not felt to need updated ischemic eval.  Not felt a candidate for MV intervention 2/2 frailty, co morbidities.  Noted malnutrition, ? [facility], Planned to reviw with Dr. Angelena Form  She saw the AFib clinic 02/02/22, with AFib and SOB, discussed attempt to rhythm control with DCCCV/AAD, they planned to see Dr. Curt Bears.  Admitted 02/17/22 w/SOB, not found hypoxic, HS [place] abnormal cardiology consulted no CP.  Given advanced age and multiple comorbidities and DNR status the decision was made to proceed with medical management.  LVEF 40-45%, severe MR, to be medically managed, felt to be euvolemic, not on diuretics Discharged 02/19/22  She saw Dr. Curt Bears 02/23/22, again c/o SOB, she was in [place] this day.  SOB makes her very anxious and lightheaded.  Started on amiodarone to try and minimize her AFib buden that may be contributing to her SOB.   *** SOB? *** amio labs *** burden *** eliquis, dose, labs, bleeding *** gen cards, clip? *** volume *** meds, CM, CAD   Device information Abbott dual chamber PPM implanted    AAD hx Amiodarone started Oct 2023   Past Medical History:  Diagnosis Date   Anxiety    Aortic Stenosis s/p TAVR    Echo 10/21: EF 55-60, no RWMA, mild asymmetric LVH, normal RVSF, RVSP 40.2 mmHg, mild MR, s/p TAVR with mean gradient 8.50mHg, no  PVL   Arthritis    OSTEO   Aspirin allergy    on Plavix   Asthma    CAD (coronary artery disease)    a. s/p CABG 2006 with Cox-Maze procedure.   Carotid artery disease ([facility]    a. s/p R CEA.   Diastolic dysfunction    Eczema    Fibromyalgia    GERD (gastroesophageal reflux disease)    Hiatal hernia    Hyperlipidemia    Hypertension    Kyphoscoliosis    Mitral regurgitation    [place]    a. pt has h/o hematuria on Eliquis and has since refused anticoagulation   Pneumonia 2015 ?   Pulmonary regurgitation    Skin cancer of arm    Stroke ([facility]    Tricuspid regurgitation     Past Surgical History:  Procedure Laterality Date   ABDOMINAL HYSTERECTOMY  1976   CAROTID ENDARTERECTOMY  2010   CORONARY ANGIOPLASTY WITH STENT PLACEMENT     CORONARY ARTERY BYPASS GRAFT  01/2005   LIMA-D1; SVG-LAD; SVG-OM; SVG-PDA   ERCP N/A 12/28/2021   Procedure: ENDOSCOPIC RETROGRADE CHOLANGIOPANCREATOGRAPHY (ERCP);  Surgeon: HCarol Ada MD;  [facility]  Service: Gastroenterology;  Laterality: N/A;   ESOPHAGOGASTRODUODENOSCOPY (EGD) WITH PROPOFOL N/A 03/19/2021   Procedure: ESOPHAGOGASTRODUODENOSCOPY (EGD) WITH PROPOFOL;  Surgeon: HCarol Ada MD;  [facility]: [facility];  Service: Endoscopy;  Laterality: N/A;   EYE SURGERY  MULTIPLE EXTRACTIONS WITH ALVEOLOPLASTY  12/28/2016   Extraction of tooth #'s 2- 5,7-10, 12,13,17-20,and 22-29 with alveoloplasty and maxillary right and left lateral exostoses reductions   MULTIPLE EXTRACTIONS WITH ALVEOLOPLASTY N/A 12/28/2016   Procedure: Extraction of tooth #'s 2- 5,7-10, 12,13,17-20,and 22-29 with alveoloplasty and maxillary right and left lateral exostoses reductions;  Surgeon: Lenn Cal, DDS;  Location: [place];  Service: Oral Surgery;  Laterality: N/A;   PACEMAKER IMPLANT N/A 01/15/2021   Procedure: PACEMAKER IMPLANT;  Surgeon: Constance Haw, MD;  Location: [place] [facility];  Service:  Cardiovascular;  Laterality: N/A;   REMOVAL OF STONES  12/28/2021   Procedure: REMOVAL OF STONES;  Surgeon: Carol Ada, MD;  Location: [place];  Service: Gastroenterology;;   RIGHT/LEFT HEART CATH AND CORONARY/GRAFT ANGIOGRAPHY N/A 10/27/2016   Procedure: Right/Left Heart Cath and Coronary/Graft Angiography;  Surgeon: Burnell Blanks, MD;  Location: [place] [facility];  Service: Cardiovascular;  Laterality: N/A;   SAVORY DILATION N/A 03/19/2021   Procedure: SAVORY DILATION;  Surgeon: Carol Ada, MD;  Location: [facility];  Service: Endoscopy;  Laterality: N/A;   SPHINCTEROTOMY  12/28/2021   Procedure: SPHINCTEROTOMY;  Surgeon: Carol Ada, MD;  Location: [place];  Service: Gastroenterology;;   TEE WITHOUT CARDIOVERSION N/A 01/03/2017   Procedure: TRANSESOPHAGEAL ECHOCARDIOGRAM (TEE);  Surgeon: Burnell Blanks, MD;  Location: [facility];  Service: Open Heart Surgery;  Laterality: N/A;   TONSILLECTOMY     TRANSCATHETER AORTIC VALVE REPLACEMENT, TRANSFEMORAL N/A 01/03/2017   Procedure: TRANSCATHETER AORTIC VALVE REPLACEMENT, TRANSFEMORAL;  Surgeon: Burnell Blanks, MD;  Location: [place];  Service: Open Heart Surgery;  Laterality: N/A;   TUMOR REMOVAL      Current Outpatient Medications  Medication Sig Dispense Refill   acetaminophen (TYLENOL) 650 MG CR tablet Take 650-1,300 mg by mouth 2 (two) times daily as needed for pain.     amiodarone (PACERONE) 200 MG tablet Take 1 tablet (200 mg total) by mouth daily. 90 tablet 3   apixaban (ELIQUIS) 2.5 MG TABS tablet Take 1 tablet (2.5 mg total) by mouth 2 (two) times daily. 60 tablet 5   Carboxymethylcellulose Sodium (EYE DROPS OP) Apply 2 drops to eye daily as needed (gritty eyes).     cephALEXin (KEFLEX) 500 MG capsule Take 1 capsule (500 mg total) by mouth 4 (four) times daily. 28 capsule 0   Cholecalciferol (VITAMIN D-3) 25 MCG (1000 UT) CAPS Take 1,000 Units by mouth daily.     docusate sodium (COLACE) 100 MG capsule  Take 100 mg by mouth daily.     Ensure (ENSURE) Take 237 mLs by mouth every other day.     FLOVENT HFA 220 MCG/ACT inhaler INHALE 2 PUFFS INTO THE LUNGS TWICE DAILY 12 g 0   LINZESS 72 MCG capsule Take 1 capsule (72 mcg total) by mouth daily. 30 capsule 0   loratadine (CLARITIN) 10 MG tablet Take 1 tablet (10 mg total) by mouth daily. 30 tablet 0   nitroGLYCERIN (NITROSTAT) 0.4 MG SL tablet DISSOLVE 1 TABLET UNDER THE TONGUE EVERY 5 MINUTES AS NEEDED FOR CHEST PAIN 75 tablet 2   OVER THE COUNTER MEDICATION Take by mouth daily as needed (cough). Guaifenesin liquid PO 1 sip by mouth daily as needed for cough     pantoprazole (PROTONIX) 40 MG tablet Take 1 tablet (40 mg total) by mouth 2 (two) times daily. 180 tablet 3   rosuvastatin (CRESTOR) 20 MG tablet TAKE 1 TABLET(20 MG) BY MOUTH DAILY 30 tablet 0  VENTOLIN HFA 108 (90 Base) MCG/ACT inhaler INHALE 2 PUFFS INTO THE LUNGS EVERY 6 HOURS AS NEEDED FOR WHEEZING OR SHORTNESS OF BREATH 18 g 0   Wheat Dextrin (BENEFIBER PO) Take 1 Scoop by mouth daily as needed (fiber).     No current facility-administered medications for this visit.    Allergies:   Other, Bactrim [sulfamethoxazole-trimethoprim], Amitriptyline hcl, Aspirin, Tape, and Zetia [ezetimibe]   Social History:  The patient  reports that she has never smoked. She quit smokeless tobacco use about 21 years ago.  Her smokeless tobacco use included snuff. She reports that she does not drink alcohol and does not use drugs.   Family History:  The patient's family history includes Heart failure in her mother; Other in her father.  ROS:  Please see the history of present illness.    All other systems are reviewed and otherwise negative.   PHYSICAL EXAM:  VS:  There were no vitals taken for this visit. BMI: There is no height or weight on file to calculate BMI. Well nourished, well developed, in no acute distress HEENT: normocephalic, atraumatic Neck: no JVD, carotid bruits or  masses Cardiac:  *** RRR; no significant murmurs, no rubs, or gallops Lungs:  *** CTA b/l, no wheezing, rhonchi or rales Abd: soft, nontender MS: no deformity or *** atrophy Ext: *** no edema Skin: warm and dry, no rash Neuro:  No gross deficits appreciated Psych: euthymic mood, full affect  *** PPM site is stable, no tethering or discomfort   EKG:  Done today and reviewed by myself shows  ***  Device interrogation done today and reviewed by myself:  ***   12/28/21: TTE 1. Left ventricular ejection fraction, by estimation, is 40 to 45%. The  left ventricle has mildly decreased function. The left ventricle  demonstrates regional wall motion abnormalities (see scoring  diagram/findings for description). Left ventricular  diastolic function could not be evaluated. There is severe hypokinesis of  the left ventricular, basal-mid inferolateral wall.   2. Right ventricular systolic function is normal. The right ventricular  size is normal. There is moderately elevated pulmonary artery systolic  pressure. The estimated right ventricular systolic pressure is [id number] mmHg.   3. Left atrial size was mildly dilated.   4. Right atrial size was mildly dilated.   5. Multiple jets of mitral insufficiency are seen. The dominant jet is  eccentric and posteriorly directed. A centrally directed jet is also seen.  The cumulative degree of regurgitation is probably severe. The mitral  valve is myxomatous. Severe mitral  valve regurgitation. There is holosystolic prolapse of both leaflets of  the mitral valve. The mean mitral valve gradient is 4.0 mmHg.   6. The tricuspid valve is myxomatous. Tricuspid valve regurgitation is  moderate.   7. The aortic valve has been repaired/replaced. Aortic valve  regurgitation is trivial. There is a 23 mm Sapien prosthetic (TAVR) valve  present in the aortic position. Procedure Date: 01/03/2017. Aortic valve  mean gradient measures 7.7 mmHg. Aortic valve  Vmax  measures 1.96 m/s. Aortic valve acceleration time measures 50 msec.   8. Pulmonic valve regurgitation is moderate.   9. The inferior vena cava is dilated in size with <50% respiratory  variability, suggesting right atrial pressure of 15 mmHg.   Comparison(s): The left ventricular function is worsened. The left  ventricular wall motion abnormality is new. Mitral regurgitation is worse  (may have been previously underestimated).    TTE 02/06/17  - Left ventricle: The cavity  size was normal. Systolic function was   normal. The estimated ejection fraction was in the range of 60%   to 65%. Wall motion was normal; there were no regional wall   motion abnormalities. The study was not technically sufficient to   allow evaluation of LV diastolic dysfunction due to atrial   fibrillation. - Aortic valve: A TAVR bioprosthetic valve is present (23 mm   Edwards-SAPIEN). There was no regurgitation. Mean gradient (S): 8   mm Hg. Peak gradient (S): 19 mm Hg. - Aortic root: The aortic root was normal in size. - Mitral valve: Calcified annulus. Moderately thickened, moderately   calcified leaflets . There was mild regurgitation. - Left atrium: The atrium was normal in size. - Right ventricle: Systolic function was normal. - Tricuspid valve: There was moderate regurgitation. - Pulmonic valve: There was mild regurgitation. - Pulmonary arteries: Systolic pressure was mildly increased. PA   peak pressure: 38 mm Hg (S). - Inferior vena cava: The vessel was normal in size. - Pericardium, extracardiac: There was no pericardial effusion.   10/27/2016: LHC Ost Cx to Prox Cx lesion, 50 %stenosed. Ost 2nd Mrg to 2nd Mrg lesion, 10 %stenosed. SVG graft was visualized by angiography and is normal in caliber. Mid RCA to Dist RCA lesion, 100 %stenosed. SVG graft was visualized by angiography and is normal in caliber. SVG graft was visualized by angiography and is normal in caliber. Mid LAD lesion, 100  %stenosed. 1st Diag lesion, 50 %stenosed. LIMA graft was visualized by non-selective angiography and is small. Dist LAD lesion, 40 %stenosed.   1. Severe triple vessel CAD s/p 4V CABG with 3/4 patent bypass grafts 2. The mid LAD is chronically occluded. The vein graft to the mid LAD is patent 3. The proximal Circumflex has moderate stenosis. The mid Circumflex stent is patent. The vein graft to the second OM is patent 4. The mid RCA is chronically occluded. The vein graft to the distal RCA is patent 5. The LIMA to the Diagonal is atretic. The Diagonal branch as moderate non-obstructive disease.  6. Severe aortic stenosis (mean gradient 41.5 mmHg, peak to peak gradient 41 mmHg, AVA 0.76cm2).    Recommendations: Will continue planning for TAVR. She will be referred to see the CT surgeons on our TAVR team and will need to have her pre op testing arranged    Holter 02/15/17  Sinus bradycardia with lowest heart rate 48 bpm Premature ventricular contractions Premature atrial contractions Transient second degree AV block.   Recent Labs: 12/28/2021: Magnesium 1.8 02/18/2022: ALT 14; B Natriuretic Peptide 185.7; BUN 7; Creatinine, Ser 0.54; Hemoglobin 13.6; Platelets 166; Potassium 3.4; Sodium 138  No results found for requested labs within last 365 days.   CrCl cannot be calculated (Patient's most recent lab result is older than the maximum 21 days allowed.).   Wt Readings from Last 3 Encounters:  04/24/22 83 lb 15.9 oz (38.1 kg)  04/22/22 84 lb (38.1 kg)  02/19/22 79 lb 12.9 oz (36.2 kg)     Other studies reviewed: Additional studies/records reviewed today include: summarized above  ASSESSMENT AND PLAN:  PPM ***  Persistent AFib CHA2DS2Vasc is 7, on Eliquis, *** appropriately dosed ***% burden *** amiodarone  Secondary hypercoagulable state ***  CAD ***  C/w dr. Angelena Form and team  VHD H/o TAVR Known severe MD Not felt an interventional candidate C/w Dr.  McAhlany/team  6.  CM Probably mixed ischemic/nonischemic 40-45% ***   Disposition: F/u with ***  Current medicines are reviewed  at length with the patient today.  The patient did not have any concerns regarding medicines.  Lori Night, PA-C 06/24/2022 7:29 AM     [address] [place] [address] [phone] (office)  [phone] (fax)

## 2022-07-04 ENCOUNTER — Ambulatory Visit: Payer: Medicare Other | Attending: Cardiovascular Disease | Admitting: Cardiovascular Disease

## 2022-07-04 ENCOUNTER — Encounter: Payer: Self-pay | Admitting: Cardiovascular Disease

## 2022-07-04 VITALS — BP 142/60 | HR 63 | Ht 62.0 in | Wt 89.4 lb

## 2022-07-04 DIAGNOSIS — I480 Paroxysmal atrial fibrillation: Secondary | ICD-10-CM | POA: Diagnosis not present

## 2022-07-04 DIAGNOSIS — I350 Nonrheumatic aortic (valve) stenosis: Secondary | ICD-10-CM

## 2022-07-04 DIAGNOSIS — I25119 Atherosclerotic heart disease of native coronary artery with unspecified angina pectoris: Secondary | ICD-10-CM | POA: Insufficient documentation

## 2022-07-04 DIAGNOSIS — I6523 Occlusion and stenosis of bilateral carotid arteries: Secondary | ICD-10-CM | POA: Insufficient documentation

## 2022-07-04 DIAGNOSIS — I495 Sick sinus syndrome: Secondary | ICD-10-CM

## 2022-07-04 NOTE — Progress Notes (Signed)
Chief Complaint  Patient presents with   Follow-up    CAD, aortic and mitral valve disease   History of Present Illness: 89 yo female with history of CAD s/p CABG, atrial fib s/p MAZE procedure, CVA, carotid artery disease s/p right CEA, fibromyalgia, GERD, mild to moderate MR, severe AS s/p TAVR and sick sinus syndrome s/p pacemaker who is here for cardiac follow up. She underwent CABG in 2006 with MAZE procedure. She was found to have severe AS by echo in July 2017 along with moderate MR. She initially refused treatment but underwent TAVR August 2018 with placement of a 23 mm Edwards Sapien 3 valve from the right femoral artery. She is known to have atrial fibrillation and has been on Eliquis. Cardiac monitor in 2018 in setting of dizziness showed pauses and possible second degree AV block. She was seen in the EP clinic by Dr. Lennie Odor and Cardizem was changed to Lopressor. Cardiac cath 10/27/16 with 3/4 patent bypass grafts. The LIMA to the Diagonal was atretic but vein grafts were open to the LAD, Circumflex and distal RCA.   She is known to have moderate carotid disease by last dopplers in June 2018. Echo August 2019 with LVEF=60-65%. Normally functioning bioprosthetic AVR. Mild mitral regurgitation. She was admitted with syncope and atrial fibrillation in August 2022 and found to have sick sinus syndrome. A pacemaker was placed. Echo August 2022 with LVEF=55-60%, mild to moderate MR. Normally functioning bioprosthetic AVR. She was seen in our office in August 2023 with SOB and reported not having air conditioning in her house. Echo August 2023 with LVEF=40-45% with severe mitral valve regurgitation. Given her malnourished state, dementia, overall poor functional state and advanced age, she has not been felt to be a good candidate for any further invasive cardiac procedures.   She is here today for follow up. The patient denies any chest pain, dyspnea, palpitations, lower extremity edema, orthopnea,  PND, dizziness, near syncope or syncope.   Primary Care Physician: Denita Lung, MD  Past Medical History:  Diagnosis Date   Anxiety    Aortic Stenosis s/p TAVR    Echo 10/21: EF 55-60, no RWMA, mild asymmetric LVH, normal RVSF, RVSP 40.2 mmHg, mild MR, s/p TAVR with mean gradient 8.70mHg, no PVL   Arthritis    OSTEO   Aspirin allergy    on Plavix   Asthma    CAD (coronary artery disease)    a. s/p CABG 2006 with Cox-Maze procedure.   Carotid artery disease ([facility]    a. s/p R CEA.   Diastolic dysfunction    Eczema    Fibromyalgia    GERD (gastroesophageal reflux disease)    Hiatal hernia    Hyperlipidemia    Hypertension    Kyphoscoliosis    Mitral regurgitation    [place]    a. pt has h/o hematuria on Eliquis and has since refused anticoagulation   Pneumonia 2015 ?   Pulmonary regurgitation    Skin cancer of arm    Stroke ([facility]    Tricuspid regurgitation     Past Surgical History:  Procedure Laterality Date   ABDOMINAL HYSTERECTOMY  1976   CAROTID ENDARTERECTOMY  2010   CORONARY ANGIOPLASTY WITH STENT PLACEMENT     CORONARY ARTERY BYPASS GRAFT  01/2005   LIMA-D1; SVG-LAD; SVG-OM; SVG-PDA   ERCP N/A 12/28/2021   Procedure: ENDOSCOPIC RETROGRADE CHOLANGIOPANCREATOGRAPHY (ERCP);  Surgeon: HCarol Ada MD;  [place]  Service: Gastroenterology;  Laterality: N/A;   ESOPHAGOGASTRODUODENOSCOPY (EGD) WITH PROPOFOL N/A 03/19/2021   Procedure: ESOPHAGOGASTRODUODENOSCOPY (EGD) WITH PROPOFOL;  Surgeon: Carol Ada, MD;  Location: [facility];  Service: Endoscopy;  Laterality: N/A;   EYE SURGERY     MULTIPLE EXTRACTIONS WITH ALVEOLOPLASTY  12/28/2016   Extraction of tooth #'s 2- 5,7-10, 12,13,17-20,and 22-29 with alveoloplasty and maxillary right and left lateral exostoses reductions   MULTIPLE EXTRACTIONS WITH ALVEOLOPLASTY N/A 12/28/2016   Procedure: Extraction of tooth #'s 2- 5,7-10, 12,13,17-20,and 22-29 with alveoloplasty  and maxillary right and left lateral exostoses reductions;  Surgeon: Lenn Cal, DDS;  Location: [place];  Service: Oral Surgery;  Laterality: N/A;   PACEMAKER IMPLANT N/A 01/15/2021   Procedure: PACEMAKER IMPLANT;  Surgeon: Constance Haw, MD;  Location: [place] [facility];  Service: Cardiovascular;  Laterality: N/A;   REMOVAL OF STONES  12/28/2021   Procedure: REMOVAL OF STONES;  Surgeon: Carol Ada, MD;  Location: [place];  Service: Gastroenterology;;   RIGHT/LEFT HEART CATH AND CORONARY/GRAFT ANGIOGRAPHY N/A 10/27/2016   Procedure: Right/Left Heart Cath and Coronary/Graft Angiography;  Surgeon: Burnell Blanks, MD;  Location: [place] [facility];  Service: Cardiovascular;  Laterality: N/A;   SAVORY DILATION N/A 03/19/2021   Procedure: SAVORY DILATION;  Surgeon: Carol Ada, MD;  Location: [facility];  Service: Endoscopy;  Laterality: N/A;   SPHINCTEROTOMY  12/28/2021   Procedure: SPHINCTEROTOMY;  Surgeon: Carol Ada, MD;  Location: [place];  Service: Gastroenterology;;   TEE WITHOUT CARDIOVERSION N/A 01/03/2017   Procedure: TRANSESOPHAGEAL ECHOCARDIOGRAM (TEE);  Surgeon: Burnell Blanks, MD;  Location: [place];  Service: Open Heart Surgery;  Laterality: N/A;   TONSILLECTOMY     TRANSCATHETER AORTIC VALVE REPLACEMENT, TRANSFEMORAL N/A 01/03/2017   Procedure: TRANSCATHETER AORTIC VALVE REPLACEMENT, TRANSFEMORAL;  Surgeon: Burnell Blanks, MD;  Location: [place];  Service: Open Heart Surgery;  Laterality: N/A;   TUMOR REMOVAL      Current Outpatient Medications  Medication Sig Dispense Refill   acetaminophen (TYLENOL) 650 MG CR tablet Take 650-1,300 mg by mouth 2 (two) times daily as needed for pain.     amiodarone (PACERONE) 200 MG tablet Take 1 tablet (200 mg total) by mouth daily. 90 tablet 3   apixaban (ELIQUIS) 2.5 MG TABS tablet Take 1 tablet (2.5 mg total) by mouth 2 (two) times daily. 60 tablet 5   Carboxymethylcellulose Sodium (EYE DROPS OP)  Apply 2 drops to eye daily as needed (gritty eyes).     Cholecalciferol (VITAMIN D-3) 25 MCG (1000 UT) CAPS Take 1,000 Units by mouth daily.     docusate sodium (COLACE) 100 MG capsule Take 100 mg by mouth daily.     Ensure (ENSURE) Take 237 mLs by mouth every other day.     FLOVENT HFA 220 MCG/ACT inhaler INHALE 2 PUFFS INTO THE LUNGS TWICE DAILY 12 g 0   LINZESS 72 MCG capsule Take 1 capsule (72 mcg total) by mouth daily. 30 capsule 0   loratadine (CLARITIN) 10 MG tablet Take 1 tablet (10 mg total) by mouth daily. 30 tablet 0   nitroGLYCERIN (NITROSTAT) 0.4 MG SL tablet DISSOLVE 1 TABLET UNDER THE TONGUE EVERY 5 MINUTES AS NEEDED FOR CHEST PAIN 75 tablet 2   OVER THE COUNTER MEDICATION Take by mouth daily as needed (cough). Guaifenesin liquid PO 1 sip by mouth daily as needed for cough     pantoprazole (PROTONIX) 40 MG tablet Take 1 tablet (40 mg total) by mouth 2 (two) times daily. 180 tablet  3   rosuvastatin (CRESTOR) 20 MG tablet TAKE 1 TABLET(20 MG) BY MOUTH DAILY 30 tablet 0   VENTOLIN HFA 108 (90 Base) MCG/ACT inhaler INHALE 2 PUFFS INTO THE LUNGS EVERY 6 HOURS AS NEEDED FOR WHEEZING OR SHORTNESS OF BREATH 18 g 0   Wheat Dextrin (BENEFIBER PO) Take 1 Scoop by mouth daily as needed (fiber).     No current facility-administered medications for this visit.    Allergies  Allergen Reactions   Other Other (See Comments)    NO ACIDIC, TART< OR SPICY FOODS- DEVELOPS REFLUX OFTEN!!  PATIENT HAS TROUBLE SWALLOWING TABLETS!!   Bactrim [Sulfamethoxazole-Trimethoprim] Other (See Comments)    "makes her feel funny" or "unsteady"    Amitriptyline Hcl Rash   Aspirin Hives, Swelling, Rash and Other (See Comments)    Body became swollen   Tape Other (See Comments)    SKIN IS VERY THIN- WILL TEAR EASILY!!   Zetia [Ezetimibe] Rash    Social History   Socioeconomic History   Marital status: Widowed    Spouse name: Not on file   Number of children: 3   Years of education: Not on file    Highest education level: Not on file  Occupational History    Employer: RETIRED  Tobacco Use   Smoking status: Never   Smokeless tobacco: Former    Types: Snuff    Quit date: 05/23/2001   Tobacco comments:    Former Snuff (02/04/2021)  Vaping Use   Vaping Use: Never used  Substance and Sexual Activity   Alcohol use: No   Drug use: No   Sexual activity: Not Currently  Other Topics Concern   Not on file  Social History Narrative   Not on file   Social Determinants of Health   Financial Resource Strain: Low Risk  (04/22/2022)   Overall Financial Resource Strain (CARDIA)    Difficulty of Paying Living Expenses: Not hard at all  Food Insecurity: No Food Insecurity (04/22/2022)   Hunger Vital Sign    Worried About Running Out of Food in the Last Year: Never true    [place] in the Last Year: Never true  Transportation Needs: No Transportation Needs (04/22/2022)   PRAPARE - Hydrologist (Medical): No    Lack of Transportation (Non-Medical): No  Physical Activity: Inactive (04/22/2022)   Exercise Vital Sign    Days of Exercise per Week: 0 days    Minutes of Exercise per Session: 0 min  Stress: No Stress Concern Present (04/22/2022)   [place]    Feeling of Stress : Not at all  Social Connections: Not on file  Intimate Partner Violence: Not on file    Family History  Problem Relation Age of Onset   Heart failure Mother    Other Father     Review of Systems:  As stated in the HPI and otherwise negative.   BP (!) 142/60   Pulse 63   Ht 5' 2"$  (1.575 m)   Wt 40.6 kg   SpO2 97%   BMI 16.35 kg/m   Physical Examination: General: Well developed, well nourished, NAD  HEENT: OP clear, mucus membranes moist  SKIN: warm, dry. No rashes. Neuro: No focal deficits  Musculoskeletal: Muscle strength 5/5 all ext  Psychiatric: Mood and affect normal  Neck: No JVD, no carotid bruits,  no thyromegaly, no lymphadenopathy.  Lungs:Clear bilaterally, no wheezes, rhonci, crackles Cardiovascular: Regular rate  and rhythm. Systolic murmur.  Abdomen:Soft. Bowel sounds present. Non-tender.  Extremities: No lower extremity edema. Pulses are 2 + in the bilateral DP/PT.  Echo August 2023: 1. Left ventricular ejection fraction, by estimation, is 40 to 45%. The  left ventricle has mildly decreased function. The left ventricle  demonstrates regional wall motion abnormalities (see scoring  diagram/findings for description). Left ventricular  diastolic function could not be evaluated. There is severe hypokinesis of  the left ventricular, basal-mid inferolateral wall.   2. Right ventricular systolic function is normal. The right ventricular  size is normal. There is moderately elevated pulmonary artery systolic  pressure. The estimated right ventricular systolic pressure is [id number] mmHg.   3. Left atrial size was mildly dilated.   4. Right atrial size was mildly dilated.   5. Multiple jets of mitral insufficiency are seen. The dominant jet is  eccentric and posteriorly directed. A centrally directed jet is also seen.  The cumulative degree of regurgitation is probably severe. The mitral  valve is myxomatous. Severe mitral  valve regurgitation. There is holosystolic prolapse of both leaflets of  the mitral valve. The mean mitral valve gradient is 4.0 mmHg.   6. The tricuspid valve is myxomatous. Tricuspid valve regurgitation is  moderate.   7. The aortic valve has been repaired/replaced. Aortic valve  regurgitation is trivial. There is a 23 mm Sapien prosthetic (TAVR) valve  present in the aortic position. Procedure Date: 01/03/2017. Aortic valve  mean gradient measures 7.7 mmHg. Aortic valve  Vmax measures 1.96 m/s. Aortic valve acceleration time measures 50 msec.   8. Pulmonic valve regurgitation is moderate.   9. The inferior vena cava is dilated in size with <50% respiratory   variability, suggesting right atrial pressure of 15 mmHg.   EKG:  EKG is  not ordered today. The ekg ordered today demonstrates   Recent Labs: 12/28/2021: Magnesium 1.8 02/18/2022: ALT 14; B Natriuretic Peptide 185.7; BUN 7; Creatinine, Ser 0.54; Hemoglobin 13.6; Platelets 166; Potassium 3.4; Sodium 138   Lipid Panel    Component Value Date/Time   CHOL 151 11/13/2019 1529   TRIG 84 11/13/2019 1529   HDL 77 11/13/2019 1529   CHOLHDL 2.0 11/13/2019 1529   CHOLHDL 2.0 08/21/2015 0001   VLDL 17 08/21/2015 0001   LDLCALC 58 11/13/2019 1529     Wt Readings from Last 3 Encounters:  07/04/22 40.6 kg  04/24/22 38.1 kg  04/22/22 38.1 kg    Assessment and Plan:   1. Severe aortic valve stenosis: She had TAVR in August 2018. Valve working well by echo in August 2023. Continue Eliquis due to prior LAA thrombus with atrial fib. She continues to use SBE prophylaxis as indicated.     2. Carotid artery disease: Mild to moderate bilateral disease by dopplers June 2020.  Will not repeat given advanced age.   3. Mitral regurgitation: Moderate to severe by echo in August 2023. Given her malnourished state, dementia, overall poor functional state and advanced age, she has not been felt to be a good candidate for any further invasive cardiac procedures.   4. CAD with stable angina: CAD stable by cath in June 2018. She is not having chest pain suggestive of angina. Continue statin. She is no longer on Imdur or the beta blocker. She is not on ASA since she is on Eliquis.        5. Atrial fibrillation,paroxysmal: Sinus today. Will continue beta blocker and Eliquis. .     6. Sick sinus syndrome:  Pacemaker in place.    Labs/ tests ordered today include:  No orders of the defined types were placed in this encounter.  Disposition:   F/U with me in 12 months.   Signed, Lauree Chandler, MD 07/04/2022 3:31 PM    [facility] [address], [place], [place]  [address] Phone:  [phone]; Fax: [phone]

## 2022-07-04 NOTE — Patient Instructions (Signed)
Medication Instructions:  No changes *If you need a refill on your cardiac medications before your next appointment, please call your pharmacy*   Lab Work: none If you have labs (blood work) drawn today and your tests are completely normal, you will receive your results only by: [place] (if you have MyChart) OR A paper copy in the mail If you have any lab test that is abnormal or we need to change your treatment, we will call you to review the results.   Testing/Procedures: none   Follow-Up: At [facility], you and your health needs are our priority.  As part of our continuing mission to provide you with exceptional heart care, we have created designated Provider Care Teams.  These Care Teams include your primary Cardiologist (physician) and Advanced Practice Providers (APPs -  Physician Assistants and Nurse Practitioners) who all work together to provide you with the care you need, when you need it.  We recommend signing up for the patient portal called "MyChart".  Sign up information is provided on this After Visit Summary.  MyChart is used to connect with patients for Virtual Visits (Telemedicine).  Patients are able to view lab/test results, encounter notes, upcoming appointments, etc.  Non-urgent messages can be sent to your provider as well.   To learn more about what you can do with MyChart, go to [email or web address].    Your next appointment:   6 month(s)  Provider:   Lauree Chandler, MD

## 2022-07-19 ENCOUNTER — Ambulatory Visit: Payer: Medicare Other

## 2022-07-19 DIAGNOSIS — I495 Sick sinus syndrome: Secondary | ICD-10-CM | POA: Diagnosis not present

## 2022-07-19 LAB — CUP PACEART REMOTE DEVICE CHECK
Battery Remaining Longevity: 86 mo
Battery Remaining Percentage: 85 %
Battery Voltage: 3.01 V
Brady Statistic AP VP Percent: 24 %
Brady Statistic AP VS Percent: 1 %
Brady Statistic AS VP Percent: 75 %
Brady Statistic AS VS Percent: 1 %
Brady Statistic RA Percent Paced: 9.7 %
Brady Statistic RV Percent Paced: 99 %
Date Time Interrogation Session: 20240227020011
Implantable Lead Connection Status: 753985
Implantable Lead Connection Status: 753985
Implantable Lead Implant Date: 20220826
Implantable Lead Implant Date: 20220826
Implantable Lead Location: 753859
Implantable Lead Location: 753860
Implantable Pulse Generator Implant Date: 20220826
Lead Channel Impedance Value: 410 Ohm
Lead Channel Impedance Value: 460 Ohm
Lead Channel Pacing Threshold Amplitude: 1 V
Lead Channel Pacing Threshold Amplitude: 1 V
Lead Channel Pacing Threshold Pulse Width: 0.5 ms
Lead Channel Pacing Threshold Pulse Width: 0.5 ms
Lead Channel Sensing Intrinsic Amplitude: 12 mV
Lead Channel Sensing Intrinsic Amplitude: 2.5 mV
Lead Channel Setting Pacing Amplitude: 2 V
Lead Channel Setting Pacing Amplitude: 2.5 V
Lead Channel Setting Pacing Pulse Width: 0.5 ms
Lead Channel Setting Sensing Sensitivity: 2 mV
Pulse Gen Model: 2272
Pulse Gen Serial Number: 3953466

## 2022-07-21 ENCOUNTER — Encounter: Payer: Medicare Other | Admitting: Physician Assistant

## 2022-07-23 ENCOUNTER — Other Ambulatory Visit: Payer: Self-pay | Admitting: Family Medicine

## 2022-07-23 DIAGNOSIS — I495 Sick sinus syndrome: Secondary | ICD-10-CM

## 2022-07-31 NOTE — Progress Notes (Deleted)
Cardiology Office Note Date:  07/31/2022  Patient ID:  [name], [name] 28-May-1928, MRN [id number] PCP:  Denita Lung, MD  Cardiologist:  Dr. Angelena Form Electrophysiologist: Dr. Curt Bears  ***refresh   Chief Complaint: *** 3 mo  History of Present Illness: [name] is a 89 y.o. female with history of CAD (CABG w/MAZE 2006), stroke, PVD, VHD (TAVR 2018), HTN, HLD, AFib w/hx of LAA thrombus, symptomatic pauses > PPM  She saw Dr. Curt Bears 02/23/22, generally ok, though reported times of SOB that were very anxiety provoking, associated with feeling near faint. Started on amiodarone with the possibility her AFib was etiology of her SOB.  She saw Dr. Angelena Form, discussed echo done 2/2 SOB, with LVEF 40-45%, severe MR, though with advanced age, malnourished state, dementia and overall poor functional state not felt to be a good candidate for additional cardiac procedures.  Planned for annual visit  *** amio labs *** any better? *** eliquis, labs, dose, bleeding *** burden  Device information Abbott dual chamber PPM implanted 01/15/21  AAD Hx Amiodarone started Oct 2023   Past Medical History:  Diagnosis Date   Anxiety    Aortic Stenosis s/p TAVR    Echo 10/21: EF 55-60, no RWMA, mild asymmetric LVH, normal RVSF, RVSP 40.2 mmHg, mild MR, s/p TAVR with mean gradient 8.65mHg, no PVL   Arthritis    OSTEO   Aspirin allergy    on Plavix   Asthma    CAD (coronary artery disease)    a. s/p CABG 2006 with Cox-Maze procedure.   [place]id [place]    [place]. s/p R CEA.   Diastolic dysfunction    Eczema    Fibromyalgia    GERD (gastroesophageal reflux disease)    Hiatal hernia    Hyperlipidemia    Hypertension    Kyphoscoliosis    Mitral regurgitation    [place]    a. pt has h/o hematuria on Eliquis and has since refused anticoagulation   Pneumonia 2015 ?   Pulmonary regurgitation    Skin cancer of arm    [facility]     Tricuspid regurgitation     Past Surgical History:  Procedure Laterality Date   ABDOMINAL HYSTERECTOMY  1976   CAROTID ENDARTERECTOMY  2010   CORONARY ANGIOPLASTY WITH STENT PLACEMENT     CORONARY ARTERY BYPASS GRAFT  01/2005   LIMA-D1; SVG-LAD; SVG-OM; SVG-PDA   ERCP N/A 12/28/2021   Procedure: ENDOSCOPIC RETROGRADE CHOLANGIOPANCREATOGRAPHY (ERCP);  Surgeon: HCarol Ada MD;  [place]  Service: Gastroenterology;  Laterality: N/A;   ESOPHAGOGASTRODUODENOSCOPY (EGD) WITH PROPOFOL N/A 03/19/2021   Procedure: ESOPHAGOGASTRODUODENOSCOPY (EGD) WITH PROPOFOL;  Surgeon: HCarol Ada MD;  [facility];  Service: Endoscopy;  Laterality: N/A;   EYE SURGERY     MULTIPLE EXTRACTIONS WITH ALVEOLOPLASTY  12/28/2016   Extraction of tooth #'s 2- 5,7-10, 12,13,17-20,and 22-29 with alveoloplasty and maxillary right and left lateral exostoses reductions   MULTIPLE EXTRACTIONS WITH ALVEOLOPLASTY N/A 12/28/2016   Procedure: Extraction of tooth #'s 2- 5,7-10, 12,13,17-20,and 22-29 with alveoloplasty and maxillary right and left lateral exostoses reductions;  Surgeon: KLenn Cal DDS;  [place]  Service: Oral Surgery;  Laterality: N/A;   PACEMAKER IMPLANT N/A 01/15/2021   Procedure: PACEMAKER IMPLANT;  Surgeon: CConstance Haw MD;  [facility];  Service: Cardiovascular;  Laterality: N/A;   REMOVAL OF STONES  12/28/2021   Procedure: REMOVAL OF STONES;  Surgeon:  Carol Ada, MD;  Location: [place];  Service: Gastroenterology;;   RIGHT/LEFT HEART CATH AND CORONARY/GRAFT ANGIOGRAPHY N/A 10/27/2016   Procedure: Right/Left Heart Cath and Coronary/Graft Angiography;  Surgeon: Burnell Blanks, MD;  Location: [place] [facility];  Service: Cardiovascular;  Laterality: N/A;   SAVORY DILATION N/A 03/19/2021   Procedure: SAVORY DILATION;  Surgeon: Carol Ada, MD;  [facility];  Service: Endoscopy;  Laterality: N/A;   SPHINCTEROTOMY   12/28/2021   Procedure: SPHINCTEROTOMY;  Surgeon: Carol Ada, MD;  Location: [place];  Service: Gastroenterology;;   TEE WITHOUT CARDIOVERSION N/A 01/03/2017   Procedure: TRANSESOPHAGEAL ECHOCARDIOGRAM (TEE);  Surgeon: Burnell Blanks, MD;  Location: [place];  Service: Open Heart Surgery;  Laterality: N/A;   TONSILLECTOMY     TRANSCATHETER AORTIC VALVE REPLACEMENT, TRANSFEMORAL N/A 01/03/2017   Procedure: TRANSCATHETER AORTIC VALVE REPLACEMENT, TRANSFEMORAL;  Surgeon: Burnell Blanks, MD;  Location: [place];  Service: Open Heart Surgery;  Laterality: N/A;   TUMOR REMOVAL      Current Outpatient Medications  Medication Sig Dispense Refill   acetaminophen (TYLENOL) 650 MG CR tablet Take 650-1,300 mg by mouth 2 (two) times daily as needed for pain.     amiodarone (PACERONE) 200 MG tablet Take 1 tablet (200 mg total) by mouth daily. 90 tablet 3   apixaban (ELIQUIS) 2.5 MG TABS tablet Take 1 tablet (2.5 mg total) by mouth 2 (two) times daily. 60 tablet 5   Carboxymethylcellulose Sodium (EYE DROPS OP) Apply 2 drops to eye daily as needed (gritty eyes).     Cholecalciferol (VITAMIN D-3) 25 MCG (1000 UT) CAPS Take 1,000 Units by mouth daily.     docusate sodium (COLACE) 100 MG capsule Take 100 mg by mouth daily.     Ensure (ENSURE) Take 237 mLs by mouth every other day.     FLOVENT HFA 220 MCG/ACT inhaler INHALE 2 PUFFS INTO THE LUNGS TWICE DAILY 12 g 0   LINZESS 72 MCG capsule Take 1 capsule (72 mcg total) by mouth daily. 30 capsule 0   loratadine (CLARITIN) 10 MG tablet Take 1 tablet (10 mg total) by mouth daily. 30 tablet 0   nitroGLYCERIN (NITROSTAT) 0.4 MG SL tablet DISSOLVE 1 TABLET UNDER THE TONGUE EVERY 5 MINUTES AS NEEDED FOR CHEST PAIN 75 tablet 2   OVER THE COUNTER MEDICATION Take by mouth daily as needed (cough). Guaifenesin liquid PO 1 sip by mouth daily as needed for cough     pantoprazole (PROTONIX) 40 MG tablet Take 1 tablet (40 mg total) by mouth 2 (two) times daily.  180 tablet 3   rosuvastatin (CRESTOR) 20 MG tablet TAKE 1 TABLET(20 MG) BY MOUTH DAILY 30 tablet 2   VENTOLIN HFA 108 (90 Base) MCG/ACT inhaler INHALE 2 PUFFS INTO THE LUNGS EVERY 6 HOURS AS NEEDED FOR WHEEZING OR SHORTNESS OF BREATH 18 g 0   Wheat Dextrin (BENEFIBER PO) Take 1 Scoop by mouth daily as needed (fiber).     No current facility-administered medications for this visit.    Allergies:   Other, Bactrim [sulfamethoxazole-trimethoprim], Amitriptyline hcl, Aspirin, Tape, and Zetia [ezetimibe]   Social History:  The patient  reports that she has never smoked. She quit smokeless tobacco use about 21 years ago.  Her smokeless tobacco use included snuff. She reports that she does not drink alcohol and does not use drugs.   Family History:  The patient's family history includes Heart failure in her mother; Other in her father.  ROS:  Please see  the history of present illness.    All other systems are reviewed and otherwise negative.   PHYSICAL EXAM:  VS:  There were no vitals taken for this visit. BMI: There is no height or weight on file to calculate BMI. Well nourished, well developed, in no acute distress HEENT: normocephalic, atraumatic Neck: no JVD, carotid bruits or masses Cardiac:  *** RRR; no significant murmurs, no rubs, or gallops Lungs:  *** CTA b/l, no wheezing, rhonchi or rales Abd: soft, nontender MS: no deformity or *** atrophy Ext: *** no edema Skin: warm and dry, no rash Neuro:  No gross deficits appreciated Psych: euthymic mood, full affect  *** PPM site is stable, no tethering or discomfort   EKG:  not done today  Device interrogation done today and reviewed by myself:  ***   12/28/21: TTE 1. Left ventricular ejection fraction, by estimation, is 40 to 45%. The  left ventricle has mildly decreased function. The left ventricle  demonstrates regional wall motion abnormalities (see scoring  diagram/findings for description). Left ventricular  diastolic  function could not be evaluated. There is severe hypokinesis of  the left ventricular, basal-mid inferolateral wall.   2. Right ventricular systolic function is normal. The right ventricular  size is normal. There is moderately elevated pulmonary artery systolic  pressure. The estimated right ventricular systolic pressure is [id number] mmHg.   3. Left atrial size was mildly dilated.   4. Right atrial size was mildly dilated.   5. Multiple jets of mitral insufficiency are seen. The dominant jet is  eccentric and posteriorly directed. A centrally directed jet is also seen.  The cumulative degree of regurgitation is probably severe. The mitral  valve is myxomatous. Severe mitral  valve regurgitation. There is holosystolic prolapse of both leaflets of  the mitral valve. The mean mitral valve gradient is 4.0 mmHg.   6. The tricuspid valve is myxomatous. Tricuspid valve regurgitation is  moderate.   7. The aortic valve has been repaired/replaced. Aortic valve  regurgitation is trivial. There is a 23 mm Sapien prosthetic (TAVR) valve  present in the aortic position. Procedure Date: 01/03/2017. Aortic valve  mean gradient measures 7.7 mmHg. Aortic valve  Vmax measures 1.96 m/s. Aortic valve acceleration time measures 50 msec.   8. Pulmonic valve regurgitation is moderate.   9. The inferior vena cava is dilated in size with <50% respiratory  variability, suggesting right atrial pressure of 15 mmHg.   Comparison(s): The left ventricular function is worsened. The left  ventricular wall motion abnormality is new. Mitral regurgitation is worse  (may have been previously underestimated).   Recent Labs: 12/28/2021: Magnesium 1.8 02/18/2022: ALT 14; B Natriuretic Peptide 185.7; BUN 7; Creatinine, Ser 0.54; Hemoglobin 13.6; Platelets 166; Potassium 3.4; Sodium 138  No results found for requested labs within last 365 days.   CrCl cannot be calculated (Patient's most recent lab result is older than the maximum  21 days allowed.).   Wt Readings from Last 3 Encounters:  07/04/22 89 lb 6.4 oz (40.6 kg)  04/24/22 83 lb 15.9 oz (38.1 kg)  04/22/22 84 lb (38.1 kg)     Other studies reviewed: Additional studies/records reviewed today include: summarized above  ASSESSMENT AND PLAN:  PPM ***  Persistent AFib CHA2DS2Vasc is 7, on Eliquis, *** appropriately dosed *** % burden *** amiodarone  CAD ***  VHD Hx of TAVR Now w/severe MR, not felt candidate for further procedures. Likely the primary cause of her SOB  Secondary hypercoagulable state  Disposition: F/u with ***  Current medicines are reviewed at length with the patient today.  The patient did not have any concerns regarding medicines.  Venetia Night, PA-C 07/31/2022 3:55 PM     [place] [address] [place] [address] [phone] (office)  [phone] (fax)

## 2022-08-02 ENCOUNTER — Ambulatory Visit: Payer: Medicare Other | Admitting: Physician Assistant

## 2022-08-12 ENCOUNTER — Encounter: Payer: Self-pay | Admitting: Nurse Practitioner

## 2022-08-12 ENCOUNTER — Ambulatory Visit (INDEPENDENT_AMBULATORY_CARE_PROVIDER_SITE_OTHER): Payer: Medicare Other | Admitting: Nurse Practitioner

## 2022-08-12 VITALS — BP 112/70 | HR 62 | Wt 93.2 lb

## 2022-08-12 DIAGNOSIS — L03818 Cellulitis of other sites: Secondary | ICD-10-CM

## 2022-08-12 DIAGNOSIS — L03032 Cellulitis of left toe: Secondary | ICD-10-CM | POA: Diagnosis not present

## 2022-08-12 DIAGNOSIS — R609 Edema, unspecified: Secondary | ICD-10-CM

## 2022-08-12 MED ORDER — DOXYCYCLINE HYCLATE 100 MG PO TABS: 100.0000 mg | ORAL_TABLET | Freq: Two times a day (BID) | ORAL | 0 refills | Status: DC

## 2022-08-12 NOTE — Progress Notes (Signed)
Orma Render, DNP, AGNP-c [facility] [address] [place], [place] [address] [phone]  Subjective:   [name] is a 89 y.o. female presents to day for evaluation of:  [name] presents today with chief complaints of a recurrent infected toenail on her left foot. She describes the affected area as swollen, sore, and throbbing, particularly at night. She reports a history of arthritis affecting her bones and joints.  The patient has undergone toenail removal twice at [facility] on [address], but the toenail has regrown each time. During a hospitalization for gallbladder and pancreatitis issues last year, she was diagnosed with cellulitis of the toe and treated with antibiotics. symptoms resolved following treatment.   [name] has a history of an aortic valve issue and expresses concern regarding the risk of infection. Additionally, she has a long-standing deformity in her finger, which has persisted for approximately 35 years, and she is seeking potential treatment options for this condition.  [name] experiences difficulty breathing and uses a wedge to lay on, though it is not sufficiently high. She previously consulted with Dr. Duwayne Heck, a pulmonary specialist, who is no longer practicing in the area.   PMH, Medications, and Allergies reviewed and updated in chart as appropriate.   ROS negative except for what is listed in HPI. Objective:  BP 112/70   Pulse 62   Wt 93 lb 3.2 oz (42.3 kg)   SpO2 99%   BMI 17.05 kg/m  Physical Exam Vitals and nursing note reviewed.  Constitutional:      Appearance: Normal appearance. She is obese.  HENT:     Head: Normocephalic.  Eyes:     Pupils: Pupils are equal, round, and reactive to light.  Cardiovascular:     Rate and Rhythm: Normal rate and regular rhythm.     Pulses: Normal pulses.     Heart sounds: Normal heart sounds.  Pulmonary:     Effort: Pulmonary effort is normal. No respiratory distress.      Breath sounds: Normal breath sounds. No wheezing, rhonchi or rales.  Musculoskeletal:        General: Swelling present.     Cervical back: Normal range of motion.     Right lower leg: Edema present.     Left lower leg: Edema present.     Comments: Mild edema and erythema noted to the left great toe on the medial side. There is no evidence of active infection at this time, however, pictures presented from the previous night due show significant erythema present.   Lymphadenopathy:     Cervical: No cervical adenopathy.  Skin:    General: Skin is warm and dry.     Capillary Refill: Capillary refill takes less than 2 seconds.  Neurological:     General: No focal deficit present.     Mental Status: She is alert and oriented to person, place, and time.  Psychiatric:        Mood and Affect: Mood normal.           Assessment & Plan:   Problem List Items Addressed This Visit     Dependent edema    The patient is currently experiencing lower extremity edema and has an underlying heart condition. On examination there is no evidence of fluid volume overload present. I suspect that the symptoms are related to dependent edema given that the patient is not elevating her feet or wearing compression stockings.  Plan: - Continue with the ongoing monitoring of fluid  retention and heart condition. - Recommend that the patient elevate their legs when feasible to alleviate symptoms. - Contemplate a referral to a cardiologist should the symptoms exhibit any exacerbation.      Cellulitis - Primary    Mild erythema and edema present to the left great toe- images from the previous night show that it does appear to worsen significantly in the evening. The patient is to begin treatment for left foot cellulitis and a recurrent ingrown toenail. We could also consider acute gout, but there is no history of this and given the improvement this morning, this is less likely.  Plan: - Prescribe doxycycline 100 mg  PO BID for a duration of 5 days. - Advise the patient to soak the affected foot in warm salt water with Epsom salts once or twice daily. - Arrange a referral to an alternative podiatrist for further evaluation and potential toenail removal. - Schedule a reevaluation following the completion of the antibiotic course.      Paronychia of great toe of left foot   Relevant Orders   Ambulatory referral to [place] E Lori Klausner, DNP, AGNP-c 08/22/2022  7:49 AM    History, Medications, Surgery, SDOH, and Family History reviewed and updated as appropriate.

## 2022-08-18 ENCOUNTER — Telehealth: Payer: Self-pay

## 2022-08-18 ENCOUNTER — Encounter: Payer: Self-pay | Admitting: Cardiology

## 2022-08-18 ENCOUNTER — Ambulatory Visit: Payer: Medicare Other | Attending: Physician Assistant | Admitting: Cardiology

## 2022-08-18 VITALS — BP 139/62 | HR 62 | Ht 62.0 in | Wt 92.0 lb

## 2022-08-18 DIAGNOSIS — I25708 Atherosclerosis of coronary artery bypass graft(s), unspecified, with other forms of angina pectoris: Secondary | ICD-10-CM | POA: Diagnosis not present

## 2022-08-18 DIAGNOSIS — J4489 Other specified chronic obstructive pulmonary disease: Secondary | ICD-10-CM | POA: Insufficient documentation

## 2022-08-18 DIAGNOSIS — I495 Sick sinus syndrome: Secondary | ICD-10-CM | POA: Insufficient documentation

## 2022-08-18 DIAGNOSIS — D6869 Other thrombophilia: Secondary | ICD-10-CM

## 2022-08-18 DIAGNOSIS — I4819 Other persistent atrial fibrillation: Secondary | ICD-10-CM | POA: Insufficient documentation

## 2022-08-18 DIAGNOSIS — L03818 Cellulitis of other sites: Secondary | ICD-10-CM

## 2022-08-18 MED ORDER — SULFAMETHOXAZOLE-TRIMETHOPRIM 800-160 MG PO TABS: 1.0000 | ORAL_TABLET | Freq: Two times a day (BID) | ORAL | 0 refills | Status: DC

## 2022-08-18 NOTE — Telephone Encounter (Signed)
It is very possible the infection requires MRSA coverage and the recent course was not strong enough. Our options are Bactrim or Doxycycline plus Amoxicillin. Her allergies show an intolerance to Bactrim as it causes her to "feel funny". I would prefer this regimen given the recent use of doxycycline and general effectiveness of the medication.  Given that the medication is an intolerance, I will send this in to the pharmacy for her to use over the weekend. Will include instructions to notify oncall if she has unusual symptoms.

## 2022-08-18 NOTE — Telephone Encounter (Signed)
[name] called and advised the doxycycline did not help Camera much. She still has throbbing in her toe. Asked what else can they do to help her until she can get in for an appt with podiatry.

## 2022-08-18 NOTE — Patient Instructions (Addendum)
Medication Instructions:   Your physician recommends that you continue on your current medications as directed. Please refer to the Current Medication list given to you today.   *If you need a refill on your cardiac medications before your next appointment, please call your pharmacy*   Lab Work:   CMET CBC  LFT AND TSH TODAY     If you have labs (blood work) drawn today and your tests are completely normal, you will receive your results only by: [place] (if you have MyChart) OR A paper copy in the mail If you have any lab test that is abnormal or we need to change your treatment, we will call you to review the results.   Testing/Procedures: NONE ORDERED  TODAY   Follow-Up At [facility], you and your health needs are our priority.  As part of our continuing mission to provide you with exceptional heart care, we have created designated Provider Care Teams.  These Care Teams include your primary Cardiologist (physician) and Advanced Practice Providers (APPs -  Physician Assistants and Nurse Practitioners) who all work together to provide you with the care you need, when you need it.  We recommend signing up for the patient portal called "MyChart".  Sign up information is provided on this After Visit Summary.  MyChart is used to connect with patients for Virtual Visits (Telemedicine).  Patients are able to view lab/test results, encounter notes, upcoming appointments, etc.  Non-urgent messages can be sent to your provider as well.   To learn more about what you can do with MyChart, go to [email or web address].    Your next appointment:  You have been referred to PULMONARY FOR CHRONIC BRONCHITIS   6 month(s)  Provider:   You may see [name], MD or one of the following Advanced Practice Providers on your designated Care Team:   Tommye Standard, [place] Legrand Como "Jonni Sanger" Chalmers Cater, [place]    Other Instructions

## 2022-08-18 NOTE — Progress Notes (Signed)
Cardiology Office Note Date:  08/18/2022  Patient ID:  [name], [name] Oct 13, 1928, MRN [id number] PCP:  Denita Lung, MD  Cardiologist:  Lauree Chandler, MD Electrophysiologist: Constance Haw, MD   Chief Complaint: 18mon follow-up  History of Present Illness: [name] is a 89 y.o. female with PMH notable for CAD s/p CABG w Maze, persistent Afib, CVA, PVC, AS s/p TAVR (2018), HTN, HLD, SND s/p PPM, severe MR; seen today for [name], MD for routine electrophysiology followup.  Remote device interrogation 12/2021 revealed increased AF burden (94%). Had previously been pursuing a rate-control only strategy She was seen in AF clinic by PA Fenton to discuss AAD + DCCV, she had been having more SOB. Patient and daughter wanted to discuss again with Dr. Curt Bears.  She last saw Dr. Curt Bears 02/2022, was having SOB during most of the day, gets anxious when SOB. In SR by EKG. He started amiodarone 200mg  daily out of concern that SOB was related to increased afib burden. 03/2022 and 06/2022 remote interrogations showed significantly decreased AF burden.  Patient reports continued SOB, sleeping on 4-5 pillows. Less SOB when sitting upright, more symptomatic when trying to lay down. She has noticed no improvement in SOB since starting amiodarone and being in SR. She has asthma, bronchitis, and allergies and thinks these are causing her SOB. She used to see pulm, but has not seen anyone in many years. She has had many urgent health problems lately (cellulitis of toe), and isn't sure she's specifically addressed SOB with PCP.  Taking eliquis BID, no bleeding concerns.   she denies chest pain, palpitations, orthopnea, nausea, vomiting, dizziness, syncope, edema, weight gain, or early satiety.    Patient's daughter joined her for appt. Patient is very [place], daughter assisted with visit.   Device Information: St. Jude dual chamber PPM, imp 12/2020; dx Afib, 2nd degree AV  block  AAD History: Amiodarone, started 02/2022  Past Medical History:  Diagnosis Date   Anxiety    Aortic Stenosis s/p TAVR    Echo 10/21: EF 55-60, no RWMA, mild asymmetric LVH, normal RVSF, RVSP 40.2 mmHg, mild MR, s/p TAVR with mean gradient 8.93mmHg, no PVL   Arthritis    OSTEO   Aspirin allergy    on Plavix   Asthma    CAD (coronary artery disease)    a. s/p CABG 2006 with Cox-Maze procedure.   Carotid artery disease ([place])    a. s/p R CEA.   Diastolic dysfunction    Eczema    Fibromyalgia    GERD (gastroesophageal reflux disease)    Hiatal hernia    Hyperlipidemia    Hypertension    Kyphoscoliosis    Mitral regurgitation    PAF (paroxysmal atrial fibrillation) ([place])    a. pt has h/o hematuria on Eliquis and has since refused anticoagulation   Pneumonia 2015 ?   Pulmonary regurgitation    Skin cancer of arm    Stroke [facility])    Tricuspid regurgitation     Past Surgical History:  Procedure Laterality Date   ABDOMINAL HYSTERECTOMY  1976   CAROTID ENDARTERECTOMY  2010   CORONARY ANGIOPLASTY WITH STENT PLACEMENT     CORONARY ARTERY BYPASS GRAFT  01/2005   LIMA-D1; SVG-LAD; SVG-OM; SVG-PDA   ERCP N/A 12/28/2021   Procedure: ENDOSCOPIC RETROGRADE CHOLANGIOPANCREATOGRAPHY (ERCP);  Surgeon: Carol Ada, MD;  Location: [place];  Service: Gastroenterology;  Laterality: N/A;   ESOPHAGOGASTRODUODENOSCOPY (EGD) WITH PROPOFOL N/A 03/19/2021   Procedure:  ESOPHAGOGASTRODUODENOSCOPY (EGD) WITH PROPOFOL;  Surgeon: Carol Ada, MD;  Location: [facility];  Service: Endoscopy;  Laterality: N/A;   EYE SURGERY     MULTIPLE EXTRACTIONS WITH ALVEOLOPLASTY  12/28/2016   Extraction of tooth #'s 2- 5,7-10, 12,13,17-20,and 22-29 with alveoloplasty and maxillary right and left lateral exostoses reductions   MULTIPLE EXTRACTIONS WITH ALVEOLOPLASTY N/A 12/28/2016   Procedure: Extraction of tooth #'s 2- 5,7-10, 12,13,17-20,and 22-29 with alveoloplasty and maxillary right and left lateral  exostoses reductions;  Surgeon: Lenn Cal, DDS;  Location: [place];  Service: Oral Surgery;  Laterality: N/A;   PACEMAKER IMPLANT N/A 01/15/2021   Procedure: PACEMAKER IMPLANT;  Surgeon: Constance Haw, MD;  Location: [place] [facility];  Service: Cardiovascular;  Laterality: N/A;   REMOVAL OF STONES  12/28/2021   Procedure: REMOVAL OF STONES;  Surgeon: Carol Ada, MD;  Location: [place];  Service: Gastroenterology;;   RIGHT/LEFT HEART CATH AND CORONARY/GRAFT ANGIOGRAPHY N/A 10/27/2016   Procedure: Right/Left Heart Cath and Coronary/Graft Angiography;  Surgeon: Burnell Blanks, MD;  Location: [place] [facility];  Service: Cardiovascular;  Laterality: N/A;   SAVORY DILATION N/A 03/19/2021   Procedure: SAVORY DILATION;  Surgeon: Carol Ada, MD;  Location: [facility];  Service: Endoscopy;  Laterality: N/A;   SPHINCTEROTOMY  12/28/2021   Procedure: SPHINCTEROTOMY;  Surgeon: Carol Ada, MD;  Location: [place];  Service: Gastroenterology;;   TEE WITHOUT CARDIOVERSION N/A 01/03/2017   Procedure: TRANSESOPHAGEAL ECHOCARDIOGRAM (TEE);  Surgeon: Burnell Blanks, MD;  Location: [place];  Service: Open Heart Surgery;  Laterality: N/A;   TONSILLECTOMY     TRANSCATHETER AORTIC VALVE REPLACEMENT, TRANSFEMORAL N/A 01/03/2017   Procedure: TRANSCATHETER AORTIC VALVE REPLACEMENT, TRANSFEMORAL;  Surgeon: Burnell Blanks, MD;  Location: [place];  Service: Open Heart Surgery;  Laterality: N/A;   TUMOR REMOVAL      Current Outpatient Medications  Medication Instructions   acetaminophen (TYLENOL) 650-1,300 mg, Oral, 2 times daily PRN   amiodarone (PACERONE) 200 mg, Oral, Daily   apixaban (ELIQUIS) 2.5 mg, Oral, 2 times daily   Carboxymethylcellulose Sodium (EYE DROPS OP) 2 drops, Ophthalmic, Daily PRN   docusate sodium (COLACE) 100 mg, Oral, Daily   doxycycline (VIBRA-TABS) 100 mg, Oral, 2 times daily   Ensure (ENSURE) 237 mLs, Oral, Every other day   FLOVENT HFA 220  MCG/ACT inhaler 2 puffs, Inhalation, 2 times daily   Linzess 72 mcg, Oral, Daily   loratadine (CLARITIN) 10 mg, Oral, Daily,     nitroGLYCERIN (NITROSTAT) 0.4 MG SL tablet DISSOLVE 1 TABLET UNDER THE TONGUE EVERY 5 MINUTES AS NEEDED FOR CHEST PAIN   OVER THE COUNTER MEDICATION Oral, Daily PRN, Guaifenesin liquid PO<BR>1 sip by mouth daily as needed for cough   pantoprazole (PROTONIX) 40 mg, Oral, 2 times daily   rosuvastatin (CRESTOR) 20 MG tablet TAKE 1 TABLET(20 MG) BY MOUTH DAILY   VENTOLIN HFA 108 (90 Base) MCG/ACT inhaler INHALE 2 PUFFS INTO THE LUNGS EVERY 6 HOURS AS NEEDED FOR WHEEZING OR SHORTNESS OF BREATH   Vitamin D-3 1,000 Units, Oral, Daily   Wheat Dextrin (BENEFIBER PO) 1 Scoop, Oral, Daily PRN      Social History:  The patient  reports that she has never smoked. She quit smokeless tobacco use about 21 years ago.  Her smokeless tobacco use included snuff. She reports that she does not drink alcohol and does not use drugs.   Family History:  The patient's family history includes Heart failure in her mother; Other in her  father.  ROS:  Please see the history of present illness. All other systems are reviewed and otherwise negative.   PHYSICAL EXAM:  VS:  BP 139/62 (BP Location: Left Arm, Patient Position: Sitting, Cuff Size: Normal)   Pulse 62   Ht 5\' 2"  (1.575 m)   Wt 92 lb (41.7 kg)   BMI 16.83 kg/m  BMI: Body mass index is 16.83 kg/m.  GEN- The patient is chronically-ill appearing, frail, alert and oriented x 3 today.   HEENT: normocephalic, atraumatic; sclera clear, conjunctiva pink; hearing intact; oropharynx clear; neck supple, no JVP Lungs- Clear to ausculation bilaterally, normal work of breathing.  No wheezes, rales, rhonchi Heart- Regular rate and rhythm, no murmurs, rubs or gallops, PMI not laterally displaced GI- soft, non-tender, non-distended, bowel sounds present, no hepatosplenomegaly Extremities- No peripheral edema. no clubbing or cyanosis; DP/PT/radial  pulses 2+ bilaterally MS- no significant deformity or atrophy Skin- warm and dry, no rash or lesion, device pocket well-healed Psych- euthymic mood, full affect Neuro- strength and sensation are intact   Device interrogation done today and reviewed by myself:  Battery good Lead thresholds, impedence, sensing grossly stable Dependent, no R-wave sensing at 30bpm in VVI No episodes Atrial threshold adjusted to maintain 2:1 safety margin No further changes made today  EKG is not ordered.    Recent Labs: 12/28/2021: Magnesium 1.8 02/18/2022: ALT 14; B Natriuretic Peptide 185.7; BUN 7; Creatinine, Ser 0.54; Hemoglobin 13.6; Platelets 166; Potassium 3.4; Sodium 138  No results found for requested labs within last 365 days.   CrCl cannot be calculated (Patient's most recent lab result is older than the maximum 21 days allowed.).   Wt Readings from Last 3 Encounters:  08/18/22 92 lb (41.7 kg)  08/12/22 93 lb 3.2 oz (42.3 kg)  07/04/22 89 lb 6.4 oz (40.6 kg)     Additional studies reviewed include: Previous EP, cardiology notes.   TTE, 12/28/21  1. Left ventricular ejection fraction, by estimation, is 40 to 45%. The left ventricle has mildly decreased function. The left ventricle demonstrates regional wall motion abnormalities (see scoring diagram/findings for description). Left ventricular diastolic function could not be evaluated. There is severe hypokinesis of the left ventricular, basal-mid inferolateral wall.   2. Right ventricular systolic function is normal. The right ventricular size is normal. There is moderately elevated pulmonary artery systolic pressure. The estimated right ventricular systolic pressure is [id number] mmHg.   3. Left atrial size was mildly dilated.   4. Right atrial size was mildly dilated.   5. Multiple jets of mitral insufficiency are seen. The dominant jet is eccentric and posteriorly directed. A centrally directed jet is also seen. The cumulative degree of  regurgitation is probably severe. The mitral valve is myxomatous. Severe mitral valve regurgitation. There is holosystolic prolapse of both leaflets of the mitral valve. The mean mitral valve gradient is 4.0 mmHg.   6. The tricuspid valve is myxomatous. Tricuspid valve regurgitation is moderate.   7. The aortic valve has been repaired/replaced. Aortic valve regurgitation is trivial. There is a 23 mm Sapien prosthetic (TAVR) valve present in the aortic position. Procedure Date: 01/03/2017. Aortic valve mean gradient measures 7.7 mmHg. Aortic valve Vmax measures 1.96 m/s. Aortic valve acceleration time measures 50 msec.   8. Pulmonic valve regurgitation is moderate.   9. The inferior vena cava is dilated in size with <50% respiratory variability, suggesting right atrial pressure of 15 mmHg.   Comparison(s): The left ventricular function is worsened. The left ventricular wall motion  abnormality is new. Mitral regurgitation is worse (may have been previously underestimated).   ASSESSMENT AND PLAN:  #) parox AFib Much improved AF burden since starting amiodarone Long discussion with patient and daughter regarding whether to continue amiodarone given no SOB improvement. At this time, they would like to continue amiodarone to maintain SR Will continue 200mg  daily Update amio labs today CHA2DS2-VASc Score = 8 [CHF History: 1, HTN History: 1, Diabetes History: 0, Stroke History: 2, Vascular Disease History: 1, Age Score: 2, Gender Score: 1].  Therefore, the patient's annual risk of stroke is 10.8 %.    [place] - 2.5mg  eliquis BID, appropriately dosed reduced for age, weight  #) SND s/p St. Jude PPM Device functioning well See paceart for details  #) CAD No ischemic symptoms  #) SOB #) Severe MR Stable, but no improvement in SOB since maintaining NSR Recommended patient discuss with PCP Referral to pulm to re-establish care for asthma, bronchitis eval Severe MR is also possible cause, but no  intervention planned given patient's frailty    Current medicines are reviewed at length with the patient today.   The patient does not have concerns regarding her medicines.  The following changes were made today:  none  Labs/ tests ordered today include:  Orders Placed This Encounter  Procedures   Hepatic function panel   TSH   Comprehensive metabolic panel   CBC   TSH   T4, free   Ambulatory referral to Pulmonology     Disposition: Follow up with Dr. Curt Bears or APP in 6 months   Signed, Mamie Levers, NP  08/18/22  4:27 PM  Electrophysiology [facility]

## 2022-08-19 LAB — COMPREHENSIVE METABOLIC PANEL
ALT: 13 IU/L (ref 0–32)
AST: 23 IU/L (ref 0–40)
Albumin/Globulin Ratio: 2.3 — ABNORMAL HIGH (ref 1.2–2.2)
Albumin: 4.4 g/dL (ref 3.6–4.6)
Alkaline Phosphatase: 45 IU/L (ref 44–121)
BUN/Creatinine Ratio: 21 (ref 12–28)
BUN: 13 mg/dL (ref 10–36)
Bilirubin Total: 0.5 mg/dL (ref 0.0–1.2)
CO2: 26 mmol/L (ref 20–29)
Calcium: 9.5 mg/dL (ref 8.7–10.3)
Chloride: 99 mmol/L (ref 96–106)
Creatinine, Ser: 0.61 mg/dL (ref 0.57–1.00)
Globulin, Total: 1.9 g/dL (ref 1.5–4.5)
Glucose: 106 mg/dL — ABNORMAL HIGH (ref 70–99)
Potassium: 4.2 mmol/L (ref 3.5–5.2)
Sodium: 140 mmol/L (ref 134–144)
Total Protein: 6.3 g/dL (ref 6.0–8.5)
eGFR: 84 mL/min/{1.73_m2} (ref >59)

## 2022-08-19 LAB — CBC
Hematocrit: 38.9 % (ref 34.0–46.6)
Hemoglobin: 13 g/dL (ref 11.1–15.9)
MCH: 31 pg (ref 26.6–33.0)
MCHC: 33.4 g/dL (ref 31.5–35.7)
MCV: 93 fL (ref 79–97)
Platelets: 180 10*3/uL (ref 150–450)
RBC: 4.19 x10E6/uL (ref 3.77–5.28)
RDW: 13 % (ref 11.7–15.4)
WBC: 8.5 10*3/uL (ref 3.4–10.8)

## 2022-08-19 LAB — HEPATIC FUNCTION PANEL: Bilirubin, Direct: 0.17 mg/dL (ref 0.00–0.40)

## 2022-08-19 LAB — T4, FREE: Free T4: 1.94 ng/dL — ABNORMAL HIGH (ref 0.82–1.77)

## 2022-08-19 LAB — TSH: TSH: 2.79 u[IU]/mL (ref 0.450–4.500)

## 2022-08-21 DIAGNOSIS — Z7951 Long term (current) use of inhaled steroids: Secondary | ICD-10-CM | POA: Diagnosis not present

## 2022-08-21 DIAGNOSIS — Z950 Presence of cardiac pacemaker: Secondary | ICD-10-CM | POA: Insufficient documentation

## 2022-08-21 DIAGNOSIS — I110 Hypertensive heart disease with heart failure: Secondary | ICD-10-CM | POA: Insufficient documentation

## 2022-08-21 DIAGNOSIS — Z7901 Long term (current) use of anticoagulants: Secondary | ICD-10-CM | POA: Diagnosis not present

## 2022-08-21 DIAGNOSIS — Z955 Presence of coronary angioplasty implant and graft: Secondary | ICD-10-CM | POA: Diagnosis not present

## 2022-08-21 DIAGNOSIS — I509 Heart failure, unspecified: Secondary | ICD-10-CM | POA: Insufficient documentation

## 2022-08-21 DIAGNOSIS — R0602 Shortness of breath: Secondary | ICD-10-CM | POA: Diagnosis not present

## 2022-08-21 DIAGNOSIS — J45909 Unspecified asthma, uncomplicated: Secondary | ICD-10-CM | POA: Diagnosis not present

## 2022-08-21 DIAGNOSIS — R0789 Other chest pain: Secondary | ICD-10-CM | POA: Diagnosis not present

## 2022-08-21 DIAGNOSIS — I2510 Atherosclerotic heart disease of native coronary artery without angina pectoris: Secondary | ICD-10-CM | POA: Diagnosis not present

## 2022-08-22 ENCOUNTER — Other Ambulatory Visit: Payer: Self-pay

## 2022-08-22 ENCOUNTER — Emergency Department (HOSPITAL_COMMUNITY)
Admission: EM | Admit: 2022-08-22 | Discharge: 2022-08-22 | Disposition: A | Discharge status: Home / Self Care | Payer: Medicare Other | Attending: Emergency Medicine | Admitting: Emergency Medicine

## 2022-08-22 ENCOUNTER — Emergency Department (HOSPITAL_COMMUNITY): Payer: Medicare Other

## 2022-08-22 DIAGNOSIS — R0602 Shortness of breath: Secondary | ICD-10-CM | POA: Diagnosis not present

## 2022-08-22 DIAGNOSIS — L03032 Cellulitis of left toe: Secondary | ICD-10-CM | POA: Insufficient documentation

## 2022-08-22 DIAGNOSIS — L0390 Cellulitis, unspecified: Secondary | ICD-10-CM | POA: Insufficient documentation

## 2022-08-22 DIAGNOSIS — R0789 Other chest pain: Secondary | ICD-10-CM | POA: Diagnosis not present

## 2022-08-22 LAB — BASIC METABOLIC PANEL
Anion gap: 10 (ref 5–15)
BUN: 10 mg/dL (ref 8–23)
CO2: 26 mmol/L (ref 22–32)
Calcium: 9.4 mg/dL (ref 8.9–10.3)
Chloride: 100 mmol/L (ref 98–111)
Creatinine, Ser: 0.59 mg/dL (ref 0.44–1.00)
GFR, Estimated: >60 mL/min (ref >60)
Glucose, Bld: 112 mg/dL — ABNORMAL HIGH (ref 70–99)
Potassium: 4.1 mmol/L (ref 3.5–5.1)
Sodium: 136 mmol/L (ref 135–145)

## 2022-08-22 LAB — TROPONIN I (HIGH SENSITIVITY)
Troponin I (High Sensitivity): 43 ng/L — ABNORMAL HIGH (ref <18)
Troponin I (High Sensitivity): 50 ng/L — ABNORMAL HIGH (ref <18)

## 2022-08-22 LAB — CBC
HCT: 40.8 % (ref 36.0–46.0)
Hemoglobin: 13 g/dL (ref 12.0–15.0)
MCH: 30.8 pg (ref 26.0–34.0)
MCHC: 31.9 g/dL (ref 30.0–36.0)
MCV: 96.7 fL (ref 80.0–100.0)
Platelets: 175 10*3/uL (ref 150–400)
RBC: 4.22 MIL/uL (ref 3.87–5.11)
RDW: 13.9 % (ref 11.5–15.5)
WBC: 6.5 10*3/uL (ref 4.0–10.5)
nRBC: 0 % (ref 0.0–0.2)

## 2022-08-22 LAB — PROTIME-INR
INR: 1.2 (ref 0.8–1.2)
Prothrombin Time: 15.1 seconds (ref 11.4–15.2)

## 2022-08-22 NOTE — ED Notes (Signed)
Pt able to use bedside commode with assistance.

## 2022-08-22 NOTE — Assessment & Plan Note (Signed)
Mild erythema and edema present to the left great toe- images from the previous night show that it does appear to worsen significantly in the evening. The patient is to begin treatment for left foot cellulitis and a recurrent ingrown toenail. We could also consider acute gout, but there is no history of this and given the improvement this morning, this is less likely.  Plan: - Prescribe doxycycline 100 mg PO BID for a duration of 5 days. - Advise the patient to soak the affected foot in warm salt water with Epsom salts once or twice daily. - Arrange a referral to an alternative podiatrist for further evaluation and potential toenail removal. - Schedule a reevaluation following the completion of the antibiotic course.

## 2022-08-22 NOTE — Discharge Instructions (Signed)
Please follow-up with your primary care provider and cardiologist. Return the emergency room for any new or concerning symptoms.

## 2022-08-22 NOTE — ED Provider Notes (Addendum)
[name] Provider Note   CSN: [id number] Arrival date & time: 08/21/22  2359     History  No chief complaint on file.   [name] is a 89 y.o. female.  HPI Patient is 89 year old female with past medical history significant for frequent episodes of shortness of breath, with history of CAD status post CABG, paroxysmal A-fib status post maze and pacemaker, fibromyalgia, reflux, moderate MR, severe AS, patient is status post TAVR, has chronic CHF, HTN, HLD, asthma  She is present emergency room today with complaints of shortness of breath.  She states that she started feeling somewhat short of breath at 10 PM last night. Her daughter is with her and indicates that pt does experience these sx often.  Pt states currently she feels well. Maybe slightly more SOB than usual. No hemoptysis, CP or LH/fatigue. She denies any leg swelling.   She states that she has had heart attacks before and that this does not feel the way she has felt when she has had heart attacks.  She denies any hemoptysis, she is on DOAC for her A-fib.  She is also on amiodarone.  She states she takes her medications regularly and does not have any missed doses of medication recently.      Home Medications Prior to Admission medications   Medication Sig Start Date End Date Taking? Authorizing Provider  acetaminophen (TYLENOL) 650 MG CR tablet Take 650-1,300 mg by mouth 2 (two) times daily as needed for pain.    [provider]  amiodarone (PACERONE) 200 MG tablet Take 1 tablet (200 mg total) by mouth daily. 02/23/22   Camnitz, Ocie Doyne, MD  apixaban (ELIQUIS) 2.5 MG TABS tablet Take 1 tablet (2.5 mg total) by mouth 2 (two) times daily. 05/20/22   Burnell Blanks, MD  Carboxymethylcellulose Sodium (EYE DROPS OP) Apply 2 drops to eye daily as needed (gritty eyes).    [provider]  Cholecalciferol (VITAMIN D-3) 25 MCG (1000 UT) CAPS Take 1,000  Units by mouth daily.    [provider]  docusate sodium (COLACE) 100 MG capsule Take 100 mg by mouth daily.    [provider]  Ensure (ENSURE) Take 237 mLs by mouth every other day.    [provider]  FLOVENT HFA 220 MCG/ACT inhaler INHALE 2 PUFFS INTO THE LUNGS TWICE DAILY 05/30/22   Denita Lung, MD  LINZESS 72 MCG capsule Take 1 capsule (72 mcg total) by mouth daily. 02/10/21   Medina-Vargas, Monina C, NP  loratadine (CLARITIN) 10 MG tablet Take 1 tablet (10 mg total) by mouth daily. 02/10/21   Medina-Vargas, Monina C, NP  nitroGLYCERIN (NITROSTAT) 0.4 MG SL tablet DISSOLVE 1 TABLET UNDER THE TONGUE EVERY 5 MINUTES AS NEEDED FOR CHEST PAIN 11/01/21   Burnell Blanks, MD  OVER THE COUNTER MEDICATION Take by mouth daily as needed (cough). Guaifenesin liquid PO 1 sip by mouth daily as needed for cough    [provider]  pantoprazole (PROTONIX) 40 MG tablet Take 1 tablet (40 mg total) by mouth 2 (two) times daily. 01/26/22   Denita Lung, MD  rosuvastatin (CRESTOR) 20 MG tablet TAKE 1 TABLET(20 MG) BY MOUTH DAILY 07/25/22   Denita Lung, MD  sulfamethoxazole-trimethoprim (BACTRIM DS) 800-160 MG tablet Take 1 tablet by mouth 2 (two) times daily. If you are not able to tolerate the medication, please let us know. 08/18/22   Orma Render, NP  VENTOLIN HFA 108 (90 Base) MCG/ACT inhaler INHALE 2 PUFFS INTO THE LUNGS EVERY 6 HOURS AS NEEDED FOR WHEEZING OR SHORTNESS OF BREATH 07/15/21   Denita Lung, MD  Wheat Dextrin (BENEFIBER PO) Take 1 Scoop by mouth daily as needed (fiber).    [provider]      Allergies    Other, Bactrim [sulfamethoxazole-trimethoprim], Amitriptyline hcl, Aspirin, Tape, and Zetia [ezetimibe]    Review of Systems   Review of Systems  Physical Exam Updated Vital Signs BP (!) 157/70 (BP Location: Right Arm)   Pulse 69   Temp (!) 97.4 F (36.3 C)   Resp 18   SpO2 97%  Physical Exam Vitals and nursing note  reviewed.  Constitutional:      General: She is not in acute distress.    Comments: Pleasant, 89 year old female, cachectic, barrel chested, speaking in full sentences, extraordinarily hard of hearing  HENT:     Head: Normocephalic and atraumatic.     Nose: Nose normal.     Mouth/Throat:     Mouth: Mucous membranes are moist.  Eyes:     General: No scleral icterus. Cardiovascular:     Rate and Rhythm: Normal rate and regular rhythm.     Pulses: Normal pulses.     Heart sounds: Normal heart sounds.  Pulmonary:     Effort: Pulmonary effort is normal. No respiratory distress.     Breath sounds: Normal breath sounds. No wheezing.  Abdominal:     Palpations: Abdomen is soft.     Tenderness: There is no abdominal tenderness. There is no guarding or rebound.  Musculoskeletal:     Cervical back: Normal range of motion.     Right lower leg: No edema.     Left lower leg: No edema.     Comments: No lower extremity edema or calf tenderness  Skin:    General: Skin is warm and dry.     Capillary Refill: Capillary refill takes less than 2 seconds.  Neurological:     Mental Status: She is alert. Mental status is at baseline.  Psychiatric:        Mood and Affect: Mood normal.        Behavior: Behavior normal.     ED Results / Procedures / Treatments   Labs (all labs ordered are listed, but only abnormal results are displayed) Labs Reviewed  BASIC METABOLIC PANEL - Abnormal; Notable for the following components:      Result Value   Glucose, Bld 112 (*)    All other components within normal limits  TROPONIN I (HIGH SENSITIVITY) - Abnormal; Notable for the following components:   Troponin I (High Sensitivity) 43 (*)    All other components within normal limits  PROTIME-INR  CBC  TROPONIN I (HIGH SENSITIVITY)    EKG EKG Interpretation  Date/Time:  Monday August 22 2022 00:15:08 EDT Ventricular Rate:  65 PR Interval:    QRS Duration: 150 QT Interval:  470 QTC Calculation: 488 R  Axis:   -87 Text Interpretation: Ventricular-paced rhythm Abnormal ECG When compared with ECG of 18-Feb-2022 08:03, PREVIOUS ECG IS PRESENT Confirmed by Addison Lank [phone]) on 08/22/2022 1:18:22 AM  Radiology DG Chest 2 View  Result Date: 08/22/2022 CLINICAL DATA:  Show shortness of breath, chest tightness EXAM: CHEST - 2 VIEW COMPARISON:  Is 02/18/2022 FINDINGS: Hyperinflation. Left pacer remains in place, unchanged. Prior CABG and aortic valve repair. Heart and mediastinal contours are within normal limits. No focal opacities or effusions.  No acute bony abnormality. IMPRESSION: Hyperinflation.  No active cardiopulmonary disease. Electronically Signed   By: Rolm Baptise M.D.   On: 08/22/2022 00:47    Procedures Procedures    Medications Ordered in ED Medications - No data to display  ED Course/ Medical Decision Making/ A&P Clinical Course as of 08/22/22 0246  Mon Aug 22, 2022  0159 Troponin I (High Sensitivity)(!): 43 Initial 43 (last visit 36>>170) [WF]    Clinical Course User Index [WF] Tedd Sias, [place]                             Medical Decision Making Amount and/or Complexity of Data Reviewed Labs: ordered. Decision-making details documented in ED Course. Radiology: ordered.   This patient presents to the ED for concern of SOB, this involves a number of treatment options, and is a complaint that carries with it a high risk of complications and morbidity. A differential diagnosis was considered for the patient's symptoms which is discussed below:   The causes for shortness of breath include but are not limited to Cardiac (AHF, pericardial effusion and tamponade, arrhythmias, ischemia, etc) Respiratory (COPD, asthma, pneumonia, pneumothorax, primary pulmonary hypertension, PE/VQ mismatch) Hematological (anemia) Neuromuscular (ALS, Guillain-Barr, etc)    Co morbidities: Discussed in HPI   Brief History:  Patient is 89 year old female with past medical history  significant for frequent episodes of shortness of breath, with history of CAD status post CABG, paroxysmal A-fib status post maze and pacemaker, fibromyalgia, reflux, moderate MR, severe AS, patient is status post TAVR, has chronic CHF, HTN, HLD, asthma  She is present emergency room today with complaints of shortness of breath.  She states that she started feeling somewhat short of breath at 10 PM last night. Her daughter is with her and indicates that pt does experience these sx often.  Pt states currently she feels well. Maybe slightly more SOB than usual. No hemoptysis, CP or LH/fatigue. She denies any leg swelling.   She states that she has had heart attacks before and that this does not feel the way she has felt when she has had heart attacks.  She denies any hemoptysis, she is on DOAC for her A-fib.  She is also on amiodarone.  She states she takes her medications regularly and does not have any missed doses of medication recently.    EMR reviewed including pt PMHx, past surgical history and past visits to ER.   See HPI for more details   Lab Tests:   I ordered and independently interpreted labs. Labs notable for Trop 43>>50 BMP NML CBC NML INR NML (Pt is on DOAC)  Imaging Studies:  NAD. I personally reviewed all imaging studies and no acute abnormality found. I agree with radiology interpretation. CXR NML  I reviewed patient's prior cardiac workups including 12/28/2021 when she had an echocardiogram that showed a 40-45% LV dysfunction that is mild.  Mildly decreased function with some wall motion abnormalities.  Cardiac Monitoring:  The patient was maintained on a cardiac monitor.  I personally viewed and interpreted the cardiac monitored which showed an underlying rhythm of: paced ventricular EKG non-ischemic   Medicines ordered:    Critical Interventions:     Consults/Attending Physician   I discussed this case with my attending physician who cosigned this note  including patient's presenting symptoms, physical exam, and planned diagnostics and interventions. Attending physician stated agreement with plan or made changes to plan which were  implemented.   Attending physician assessed patient at bedside.     Reevaluation:  After the interventions noted above I re-evaluated patient and found that they have :improved Pt states no sx currently at all. She feels at her baseline.   Social Determinants of Health:      Problem List / ED Course:  Shortness of breath earlier today.  Seems to be feeling well here in the emergency department.  Chest x-ray without infiltrate or acute finding no pneumonia or pneumothorax.  Exam is normal, vital signs are normal here with no hypoxia tachycardia or hypotension.  She is overall nontoxic-appearing afebrile and pleasant.  Troponin x 2 without significant delta mildly elevated but likely not far from her baseline.  As she feels completely at her baseline currently and has reassuring workup will discharge home with follow-up with cardiology outpatient and return precautions to the emergency department. I specifically doubt PE as patient is on Eliquis for A-fib and is not having any chest pain or abnormal vital signs.  I doubt pneumonia with her normal chest x-ray and lack of new cough or productive sputum or fever.  Arrhythmia and anxiety could certainly be causing some of her symptoms.  After reviewing her cardiology notes it does appear that there is a mention of a heavy PAC burden. Patient is agreeable to this plan to discharge home.  Return precautions discussed   Dispostion:  After consideration of the diagnostic results and the patients response to treatment, I feel that the patent would benefit from outpatient follow-up.   Final Clinical Impression(s) / ED Diagnoses Final diagnoses:  Shortness of breath    Rx / DC Orders ED Discharge Orders     None         Tedd Sias, [place] 08/22/22 0448     Tedd Sias, PA 08/22/22 0449    Maudie Flakes, MD 08/22/22 [phone]

## 2022-08-22 NOTE — ED Triage Notes (Signed)
Patient reports SOB with chest tightness and occasional dry cough today .

## 2022-08-22 NOTE — Assessment & Plan Note (Signed)
The patient is currently experiencing lower extremity edema and has an underlying heart condition. On examination there is no evidence of fluid volume overload present. I suspect that the symptoms are related to dependent edema given that the patient is not elevating her feet or wearing compression stockings.  Plan: - Continue with the ongoing monitoring of fluid retention and heart condition. - Recommend that the patient elevate their legs when feasible to alleviate symptoms. - Contemplate a referral to a cardiologist should the symptoms exhibit any exacerbation.

## 2022-08-23 ENCOUNTER — Other Ambulatory Visit: Payer: Self-pay | Admitting: Family Medicine

## 2022-08-23 ENCOUNTER — Telehealth: Payer: Self-pay

## 2022-08-23 DIAGNOSIS — J45909 Unspecified asthma, uncomplicated: Secondary | ICD-10-CM

## 2022-08-23 NOTE — Transitions of Care (Post Inpatient/ED Visit) (Signed)
   08/23/2022  Name: [name] MRN: [id number] DOB: 1929/04/07  Today's TOC FU Call Status: Today's TOC FU Call Status:: Successful TOC FU Call Competed TOC FU Call Complete Date: 08/23/22  Transition Care Management Follow-up Telephone Call Date of Discharge: 08/22/22 Discharge Facility: [name]) Type of Discharge: Inpatient Admission Primary Inpatient Discharge Diagnosis:: "SOB" How have you been since you were released from the hospital?: Same (Spoke with [name]-she states patient continues to get SOB at night when trying to sleep. She is using extra King.-taking Flovent BID-but did not like the way Ventolin prn inhaler-'made her get choked up") Any questions or concerns?: Yes Patient Questions/Concerns:: Daughter would like for patient to be referred to pulmonologist for furhter evaluation of her ongoing SOB issues Patient Questions/Concerns Addressed: Notified Provider of Patient Questions/Concerns, Other: (RN CM assisted with scheduling PCP follow up appt for tomorrow -advised daughter to discuss during visit)  Items Reviewed: Did you receive and understand the discharge instructions provided?: Yes Medications obtained and verified?: Yes (Medications Reviewed) (Patient was prescribed Bactrim for cellulitis to toe but listed as an allergy-daughter states pharmacy is contacting provider to get med switched to Doxycycline and will notify her when med ready) Any new allergies since your discharge?: No Dietary orders reviewed?: NA Do you have support at home?: Yes People in Home: child(ren), adult Name of Support/Comfort Primary Source: [place] and Equipment/Supplies: [place] Ordered?: NA Any new equipment or medical supplies ordered?: NA  Functional Questionnaire: Do you need assistance with bathing/showering or dressing?: Yes Do you need assistance with meal preparation?: Yes Do you need assistance with  eating?: No Do you have difficulty maintaining continence: Yes Do you need assistance with getting out of bed/getting out of a chair/moving?: Yes Do you have difficulty managing or taking your medications?: Yes  Follow up appointments reviewed: PCP Follow-up appointment confirmed?: Yes Date of PCP follow-up appointment?: 08/24/22 Follow-up Provider: Dr. [facility] Follow-up appointment confirmed?: NA Do you need transportation to your follow-up appointment?: No Do you understand care options if your condition(s) worsen?: Yes-patient verbalized understanding  SDOH Interventions Today    Flowsheet Row Most Recent Value  SDOH Interventions   Food Insecurity Interventions Intervention Not Indicated  Transportation Interventions Intervention Not Indicated      TOC Interventions Today    Flowsheet Row Most Recent Value  TOC Interventions   TOC Interventions Discussed/Reviewed TOC Interventions Discussed, Arranged PCP follow up within 7 days/Care Guide scheduled      Interventions Today    Flowsheet Row Most Recent Value  General Interventions   General Interventions Discussed/Reviewed General Interventions Discussed, Doctor Visits  Doctor Visits Discussed/Reviewed Doctor Visits Discussed, PCP, Specialist  PCP/Specialist Visits Compliance with follow-up visit  Education Interventions   Education Provided Provided Education  Provided Verbal Education On Nutrition, When to see the doctor  Nutrition Interventions   Nutrition Discussed/Reviewed Nutrition Discussed, Adding fruits and vegetables, Decreasing salt  Pharmacy Interventions   Pharmacy Dicussed/Reviewed Pharmacy Topics Discussed, Medications and their functions  Safety Interventions   Safety Discussed/Reviewed Safety Discussed       Hetty Blend [facility] Care Management Care Management Community Coordinator Direct Phone: [phone] Toll Free: [phone] Fax:  [phone]

## 2022-08-24 ENCOUNTER — Encounter: Payer: Self-pay | Admitting: Family Medicine

## 2022-08-24 ENCOUNTER — Ambulatory Visit (INDEPENDENT_AMBULATORY_CARE_PROVIDER_SITE_OTHER): Payer: Medicare Other | Admitting: Family Medicine

## 2022-08-24 VITALS — BP 128/52 | HR 63 | Temp 98.8°F | Resp 22 | Wt 91.0 lb

## 2022-08-24 DIAGNOSIS — J45909 Unspecified asthma, uncomplicated: Secondary | ICD-10-CM

## 2022-08-24 DIAGNOSIS — L03032 Cellulitis of left toe: Secondary | ICD-10-CM | POA: Diagnosis not present

## 2022-08-24 DIAGNOSIS — Z515 Encounter for palliative care: Secondary | ICD-10-CM

## 2022-08-24 MED ORDER — FLUTICASONE PROPIONATE HFA 220 MCG/ACT IN AERO: 2.0000 | INHALATION_SPRAY | Freq: Two times a day (BID) | RESPIRATORY_TRACT | 5 refills | Status: DC

## 2022-08-24 NOTE — Progress Notes (Signed)
Remote pacemaker transmission.   

## 2022-08-24 NOTE — Progress Notes (Signed)
   Subjective:    Patient ID: [name], female    DOB: [birth year], 89 y.o.   MRN: [id number]  HPI She is here for recheck.  She was recently seen in the emergency room for difficulty with shortness of breath.  The emergency room record was reviewed.  She does have a history of underlying COPD as well as heart disease.  She responded to conservative care in the emergency room.  She does have Ventolin that she uses and has been using the rescue inhaler periodically.  She does tend to have more shortness of breath at night.  Her daughter continues to take good care of her.  She is involved in palliative care.  She also continues to complain of right great toe pain at the very tip.  She has seen podiatry in the past for this.   Review of Systems     Objective:   Physical Exam Alert and in no distress.  Cardiac and lung exam is normal.  Exam of the great toe does show some slight erythema to the very tip of the toe.  It was nontender although she does complain of pain in that area.  She does have evidence of onychomycosis.       Assessment & Plan:  Palliative care patient  Asthma, unspecified asthma severity, unspecified whether complicated, unspecified whether persistent - Plan: fluticasone (FLOVENT HFA) 220 MCG/ACT inhaler  Uncomplicated asthma, unspecified asthma severity, unspecified whether persistent  Paronychia of great toe of left foot - Plan: Ambulatory referral to Podiatry Reviewed use of insulin as well as a rescue inhaler recommending when she has the shortness of breath with to use that first with a slow inhalation and then repeating it.  She will continue on her other medications.  I will then send to podiatry to have them look at her toe to see if more aggressive care needs to be done.  Do not think she needs an antibiotic for her toe.

## 2022-08-25 ENCOUNTER — Telehealth: Payer: Self-pay | Admitting: Family Medicine

## 2022-08-25 MED ORDER — ARNUITY ELLIPTA 100 MCG/ACT IN AEPB: 1.0000 | INHALATION_SPRAY | Freq: Every day | RESPIRATORY_TRACT | 5 refills | Status: DC

## 2022-08-25 NOTE — Telephone Encounter (Signed)
[facility] [employer] sent a fax and Fluticasone HFA 272mcg is NOT covered, preferred alternative is Arnuity Ellipta. Is this okay to switch? Please advise, I can send or if you prefer to send. Thanks.

## 2022-08-25 NOTE — Telephone Encounter (Signed)
Pharmacy states that flovent is not covered by insurance,  The preferred alternative is arnuityelptinhmcg

## 2022-08-26 ENCOUNTER — Telehealth: Payer: Self-pay | Admitting: Internal Medicine

## 2022-08-26 DIAGNOSIS — Z515 Encounter for palliative care: Secondary | ICD-10-CM

## 2022-08-26 DIAGNOSIS — E43 Unspecified severe protein-calorie malnutrition: Secondary | ICD-10-CM

## 2022-08-26 NOTE — Telephone Encounter (Signed)
Pt's daughter called and said she last used [place] home health for services so she is find going back there for home health assesment. [phone]

## 2022-08-29 ENCOUNTER — Telehealth: Payer: Self-pay | Admitting: Family Medicine

## 2022-08-29 NOTE — Telephone Encounter (Signed)
She was taking Flovent aerosol  and you changed to [employer] and it is a powder and she can not take She can't use powder, she gets strangled   She can get the Generic flovent but an appeal has to be done per daughter, she spoke to [employer]  [phone]

## 2022-08-29 NOTE — Telephone Encounter (Signed)
I have put in order.

## 2022-09-02 ENCOUNTER — Telehealth: Payer: Self-pay | Admitting: Family Medicine

## 2022-09-02 NOTE — Telephone Encounter (Signed)
[name] called and states that pulmonary called her and scheduled an appointment for [name] 09/20/22 and she said you all discussed to wait on this so she just wanted to know if [name] should keep the appointment or cancel?

## 2022-09-05 ENCOUNTER — Encounter (HOSPITAL_COMMUNITY): Payer: Self-pay | Admitting: Emergency Medicine

## 2022-09-05 ENCOUNTER — Observation Stay (HOSPITAL_COMMUNITY)
Admission: EM | Admit: 2022-09-05 | Discharge: 2022-09-07 | Disposition: A | Discharge status: Home / Self Care | Payer: Medicare Other | Attending: Internal Medicine | Admitting: Internal Medicine

## 2022-09-05 ENCOUNTER — Other Ambulatory Visit: Payer: Self-pay

## 2022-09-05 DIAGNOSIS — Z952 Presence of prosthetic heart valve: Secondary | ICD-10-CM | POA: Diagnosis not present

## 2022-09-05 DIAGNOSIS — Z79899 Other long term (current) drug therapy: Secondary | ICD-10-CM | POA: Diagnosis not present

## 2022-09-05 DIAGNOSIS — Z85828 Personal history of other malignant neoplasm of skin: Secondary | ICD-10-CM | POA: Diagnosis not present

## 2022-09-05 DIAGNOSIS — I480 Paroxysmal atrial fibrillation: Secondary | ICD-10-CM | POA: Diagnosis not present

## 2022-09-05 DIAGNOSIS — Z950 Presence of cardiac pacemaker: Secondary | ICD-10-CM | POA: Insufficient documentation

## 2022-09-05 DIAGNOSIS — Z8673 Personal history of transient ischemic attack (TIA), and cerebral infarction without residual deficits: Secondary | ICD-10-CM | POA: Diagnosis not present

## 2022-09-05 DIAGNOSIS — R0602 Shortness of breath: Secondary | ICD-10-CM | POA: Diagnosis not present

## 2022-09-05 DIAGNOSIS — Z66 Do not resuscitate: Secondary | ICD-10-CM | POA: Insufficient documentation

## 2022-09-05 DIAGNOSIS — I110 Hypertensive heart disease with heart failure: Secondary | ICD-10-CM | POA: Diagnosis not present

## 2022-09-05 DIAGNOSIS — J45909 Unspecified asthma, uncomplicated: Secondary | ICD-10-CM | POA: Insufficient documentation

## 2022-09-05 DIAGNOSIS — R778 Other specified abnormalities of plasma proteins: Secondary | ICD-10-CM | POA: Diagnosis not present

## 2022-09-05 DIAGNOSIS — I4819 Other persistent atrial fibrillation: Secondary | ICD-10-CM

## 2022-09-05 DIAGNOSIS — I4891 Unspecified atrial fibrillation: Secondary | ICD-10-CM | POA: Diagnosis not present

## 2022-09-05 DIAGNOSIS — I5030 Unspecified diastolic (congestive) heart failure: Secondary | ICD-10-CM | POA: Insufficient documentation

## 2022-09-05 DIAGNOSIS — Z7901 Long term (current) use of anticoagulants: Secondary | ICD-10-CM | POA: Diagnosis not present

## 2022-09-05 DIAGNOSIS — R7989 Other specified abnormal findings of blood chemistry: Secondary | ICD-10-CM | POA: Diagnosis present

## 2022-09-05 DIAGNOSIS — I10 Essential (primary) hypertension: Secondary | ICD-10-CM | POA: Diagnosis not present

## 2022-09-05 DIAGNOSIS — J449 Chronic obstructive pulmonary disease, unspecified: Secondary | ICD-10-CM | POA: Insufficient documentation

## 2022-09-05 NOTE — ED Triage Notes (Signed)
Pt BIB EMS from home for ShOB, resolved while enroute to ED. Hx of CHF and asthma. Denies chest pain. EMS VS: 202/100 HR 80 96% RA CBG 110

## 2022-09-05 NOTE — ED Provider Notes (Signed)
[place] [facility]ERGENCY DEPARTMENT AT [facility] Provider Note   CSN: [id number] Arrival date & time: 09/05/22  2329     History {Add pertinent medical, surgical, social history, OB history to HPI:1} Chief Complaint  Patient presents with   Shortness of Breath    [name] is a 89 y.o. female.  The history is provided by the patient and the EMS personnel.  Shortness of Breath She has history of hypertension, hyperlipidemia, stroke, coronary artery disease, paroxysmal atrial fibrillation anticoagulated on apixaban, aortic stenosis status post TAVR, diastolic heart failure and comes in by ambulance because of shortness of breath.  She denies any chest pain, heaviness, tightness, pressure.  She denies fever or chills.  She denies any cough.  EMS relates that she initially complained of shortness of breath, but during transport stopped complaining of shortness of breath.   Home Medications Prior to Admission medications   Medication Sig Start Date End Date Taking? Authorizing Provider  acetaminophen (TYLENOL) 650 MG CR tablet Take 650-1,300 mg by mouth 2 (two) times daily as needed for pain.    [provider]  amiodarone (PACERONE) 200 MG tablet Take 1 tablet (200 mg total) by mouth daily. 02/23/22   Camnitz, Andree Coss, MD  apixaban (ELIQUIS) 2.5 MG TABS tablet Take 1 tablet (2.5 mg total) by mouth 2 (two) times daily. 05/20/22   Kathleene Hazel, MD  Carboxymethylcellulose Sodium (EYE DROPS OP) Apply 2 drops to eye daily as needed (gritty eyes).    [provider]  Cholecalciferol (VITAMIN D-3) 25 MCG (1000 UT) CAPS Take 1,000 Units by mouth daily.    [provider]  docusate sodium (COLACE) 100 MG capsule Take 100 mg by mouth daily.    [provider]  Ensure (ENSURE) Take 237 mLs by mouth every other day.    [provider]  Fluticasone Furoate (ARNUITY ELLIPTA) 100 MCG/ACT AEPB Inhale 1 Inhalation into the lungs daily.  08/25/22   Ronnald Nian, MD  LINZESS 72 MCG capsule Take 1 capsule (72 mcg total) by mouth daily. 02/10/21   Medina-Vargas, Monina C, NP  loratadine (CLARITIN) 10 MG tablet Take 1 tablet (10 mg total) by mouth daily. 02/10/21   Medina-Vargas, Monina C, NP  nitroGLYCERIN (NITROSTAT) 0.4 MG SL tablet DISSOLVE 1 TABLET UNDER THE TONGUE EVERY 5 MINUTES AS NEEDED FOR CHEST PAIN 11/01/21   Kathleene Hazel, MD  OVER THE COUNTER MEDICATION Take by mouth daily as needed (cough). Guaifenesin liquid PO 1 sip by mouth daily as needed for cough    [provider]  pantoprazole (PROTONIX) 40 MG tablet Take 1 tablet (40 mg total) by mouth 2 (two) times daily. 01/26/22   Ronnald Nian, MD  rosuvastatin (CRESTOR) 20 MG tablet TAKE 1 TABLET(20 MG) BY MOUTH DAILY 07/25/22   Ronnald Nian, MD  sulfamethoxazole-trimethoprim (BACTRIM DS) 800-160 MG tablet Take 1 tablet by mouth 2 (two) times daily. If you are not able to tolerate the medication, please let us know. Patient not taking: Reported on 08/23/2022 08/18/22   Early, Sung Amabile, NP  VENTOLIN HFA 108 (90 Base) MCG/ACT inhaler INHALE 2 PUFFS INTO THE LUNGS EVERY 6 HOURS AS NEEDED FOR WHEEZING OR SHORTNESS OF BREATH 07/15/21   Ronnald Nian, MD  Wheat Dextrin (BENEFIBER PO) Take 1 Scoop by mouth daily as needed (fiber).    [provider]      Allergies    Other, Bactrim [sulfamethoxazole-trimethoprim], Amitriptyline hcl, Aspirin, Tape, and  Zetia [ezetimibe]    Review of Systems   Review of Systems  Respiratory:  Positive for shortness of breath.   All other systems reviewed and are negative.   Physical Exam Updated Vital Signs There were no vitals taken for this visit. Physical Exam Vitals and nursing note reviewed.   89 year old female, resting comfortably and in no acute distress. Vital signs are ***. Oxygen saturation is ***%, which is normal. Head is normocephalic and atraumatic. PERRLA, EOMI. Oropharynx is clear. Neck is  nontender and supple without adenopathy or JVD. Back is nontender and there is no CVA tenderness. Lungs are clear without rales, wheezes, or rhonchi. Chest is nontender. Heart has regular rate and rhythm without murmur. Abdomen is soft, flat, nontender. Extremities have no cyanosis or edema, full range of motion is present. Skin is warm and dry without rash. Neurologic: Mental status is normal, cranial nerves are intact, moves all extremities equally.  ED Results / Procedures / Treatments   Labs (all labs ordered are listed, but only abnormal results are displayed) Labs Reviewed  COMPREHENSIVE METABOLIC PANEL  BRAIN NATRIURETIC PEPTIDE  CBC WITH DIFFERENTIAL/PLATELET  D-DIMER, QUANTITATIVE  TROPONIN I (HIGH SENSITIVITY)    EKG None  Radiology No results found.  Procedures Procedures  {Document cardiac monitor, telemetry assessment procedure when appropriate:1}  Medications Ordered in ED Medications - No data to display  ED Course/ Medical Decision Making/ A&P   {   Click here for ABCD2, HEART and other calculatorsREFRESH Note before signing :1}                          Medical Decision Making Amount and/or Complexity of Data Reviewed Labs: ordered. Radiology: ordered.   Acute dyspnea of uncertain cause.  Clinically, no signs of heart failure.  She is anticoagulated, pulmonary embolism very unlikely.  Consider ACS, pneumonia, COPD exacerbation.  I have ordered workup including electrocardiogram, chest x-ray, CBC, comprehensive metabolic panel, troponin x 2, BNP.  Since she is anticoagulated, I am not pursuing diagnosis of pulmonary embolism, D-dimer is not ordered.  I have reviewed her past records, and she had an ED visit on 08/22/2022 with similar presentation and negative workup.  Echocardiogram on 12/28/2021 showed ejection fraction 40-45% with regional wall motion abnormalities noted, unable to assess diastolic dysfunction because of atrial fibrillation.  Cardiology  office visit on 07/29/2022 states permanent atrial fibrillation with goal of rate control.  {Document critical care time when appropriate:1} {Document review of labs and clinical decision tools ie heart score, Chads2Vasc2 etc:1}  {Document your independent review of radiology images, and any outside records:1} {Document your discussion with family members, caretakers, and with consultants:1} {Document social determinants of health affecting pt's care:1} {Document your decision making why or why not admission, treatments were needed:1} Final Clinical Impression(s) / ED Diagnoses Final diagnoses:  None    Rx / DC Orders ED Discharge Orders     None

## 2022-09-06 ENCOUNTER — Observation Stay (HOSPITAL_BASED_OUTPATIENT_CLINIC_OR_DEPARTMENT_OTHER): Payer: Medicare Other

## 2022-09-06 ENCOUNTER — Emergency Department (HOSPITAL_COMMUNITY): Payer: Medicare Other

## 2022-09-06 ENCOUNTER — Ambulatory Visit: Payer: Medicare Other | Admitting: Podiatry

## 2022-09-06 DIAGNOSIS — R0602 Shortness of breath: Secondary | ICD-10-CM | POA: Diagnosis not present

## 2022-09-06 DIAGNOSIS — R0609 Other forms of dyspnea: Secondary | ICD-10-CM

## 2022-09-06 DIAGNOSIS — I480 Paroxysmal atrial fibrillation: Secondary | ICD-10-CM | POA: Diagnosis not present

## 2022-09-06 DIAGNOSIS — R7989 Other specified abnormal findings of blood chemistry: Secondary | ICD-10-CM | POA: Diagnosis not present

## 2022-09-06 DIAGNOSIS — Z66 Do not resuscitate: Secondary | ICD-10-CM

## 2022-09-06 DIAGNOSIS — R778 Other specified abnormalities of plasma proteins: Secondary | ICD-10-CM | POA: Diagnosis not present

## 2022-09-06 DIAGNOSIS — J449 Chronic obstructive pulmonary disease, unspecified: Secondary | ICD-10-CM

## 2022-09-06 LAB — CBC WITH DIFFERENTIAL/PLATELET
Abs Immature Granulocytes: 0.02 10*3/uL (ref 0.00–0.07)
Basophils Absolute: 0 10*3/uL (ref 0.0–0.1)
Basophils Relative: 1 %
Eosinophils Absolute: 0.1 10*3/uL (ref 0.0–0.5)
Eosinophils Relative: 1 %
HCT: 37.6 % (ref 36.0–46.0)
Hemoglobin: 12.7 g/dL (ref 12.0–15.0)
Immature Granulocytes: 0 %
Lymphocytes Relative: 13 %
Lymphs Abs: 1.1 10*3/uL (ref 0.7–4.0)
MCH: 31.4 pg (ref 26.0–34.0)
MCHC: 33.8 g/dL (ref 30.0–36.0)
MCV: 92.8 fL (ref 80.0–100.0)
Monocytes Absolute: 0.6 10*3/uL (ref 0.1–1.0)
Monocytes Relative: 6 %
Neutro Abs: 7 10*3/uL (ref 1.7–7.7)
Neutrophils Relative %: 79 %
Platelets: 175 10*3/uL (ref 150–400)
RBC: 4.05 MIL/uL (ref 3.87–5.11)
RDW: 13.7 % (ref 11.5–15.5)
WBC: 8.9 10*3/uL (ref 4.0–10.5)
nRBC: 0 % (ref 0.0–0.2)

## 2022-09-06 LAB — ECHOCARDIOGRAM COMPLETE
AR max vel: 1.81 cm2
AV Area VTI: 1.71 cm2
AV Area mean vel: 1.77 cm2
AV Mean grad: 8 mmHg
AV Peak grad: 13.2 mmHg
Ao pk vel: 1.82 m/s
Area-P 1/2: 2.6 cm2
Height: 62 in
MV VTI: 1.71 cm2
S' Lateral: 2.9 cm
Weight: 1455.99 oz

## 2022-09-06 LAB — VITAMIN B12: Vitamin B-12: 233 pg/mL (ref 180–914)

## 2022-09-06 LAB — TROPONIN I (HIGH SENSITIVITY)
Troponin I (High Sensitivity): 27 ng/L — ABNORMAL HIGH (ref <18)
Troponin I (High Sensitivity): 54 ng/L — ABNORMAL HIGH (ref <18)
Troponin I (High Sensitivity): 66 ng/L — ABNORMAL HIGH (ref <18)
Troponin I (High Sensitivity): 79 ng/L — ABNORMAL HIGH (ref <18)
Troponin I (High Sensitivity): 97 ng/L — ABNORMAL HIGH (ref <18)

## 2022-09-06 LAB — COMPREHENSIVE METABOLIC PANEL
ALT: 14 U/L (ref 0–44)
AST: 21 U/L (ref 15–41)
Albumin: 4 g/dL (ref 3.5–5.0)
Alkaline Phosphatase: 34 U/L — ABNORMAL LOW (ref 38–126)
Anion gap: 14 (ref 5–15)
BUN: 9 mg/dL (ref 8–23)
CO2: 26 mmol/L (ref 22–32)
Calcium: 9.2 mg/dL (ref 8.9–10.3)
Chloride: 95 mmol/L — ABNORMAL LOW (ref 98–111)
Creatinine, Ser: 0.65 mg/dL (ref 0.44–1.00)
GFR, Estimated: >60 mL/min (ref >60)
Glucose, Bld: 125 mg/dL — ABNORMAL HIGH (ref 70–99)
Potassium: 3.7 mmol/L (ref 3.5–5.1)
Sodium: 135 mmol/L (ref 135–145)
Total Bilirubin: 0.6 mg/dL (ref 0.3–1.2)
Total Protein: 6.8 g/dL (ref 6.5–8.1)

## 2022-09-06 LAB — IRON AND TIBC
Iron: 53 ug/dL (ref 28–170)
Saturation Ratios: 17 % (ref 10.4–31.8)
TIBC: 304 ug/dL (ref 250–450)
UIBC: 251 ug/dL

## 2022-09-06 LAB — RETICULOCYTES
Immature Retic Fract: 5.4 % (ref 2.3–15.9)
RBC.: 3.99 MIL/uL (ref 3.87–5.11)
Retic Count, Absolute: 45.5 10*3/uL (ref 19.0–186.0)
Retic Ct Pct: 1.1 % (ref 0.4–3.1)

## 2022-09-06 LAB — BRAIN NATRIURETIC PEPTIDE: B Natriuretic Peptide: 170.7 pg/mL — ABNORMAL HIGH (ref 0.0–100.0)

## 2022-09-06 LAB — FERRITIN: Ferritin: 68 ng/mL (ref 11–307)

## 2022-09-06 LAB — FOLATE: Folate: 11.6 ng/mL (ref >5.9)

## 2022-09-06 MED ORDER — LINACLOTIDE 72 MCG PO CAPS
72.0000 ug | ORAL_CAPSULE | Freq: Every day | ORAL | Status: DC
  Administered 2022-09-07: 72 ug via ORAL
  Filled 2022-09-06 (×2): qty 1

## 2022-09-06 MED ORDER — FLUTICASONE PROPIONATE HFA 220 MCG/ACT IN AERO: 2.0000 | INHALATION_SPRAY | Freq: Two times a day (BID) | RESPIRATORY_TRACT | Status: DC

## 2022-09-06 MED ORDER — ROSUVASTATIN CALCIUM 20 MG PO TABS
20.0000 mg | ORAL_TABLET | Freq: Every day | ORAL | Status: DC
  Administered 2022-09-06 – 2022-09-07 (×2): 20 mg via ORAL
  Filled 2022-09-06 (×2): qty 1

## 2022-09-06 MED ORDER — FLUTICASONE PROPIONATE HFA 220 MCG/ACT IN AERO
2.0000 | INHALATION_SPRAY | Freq: Two times a day (BID) | RESPIRATORY_TRACT | Status: DC
  Administered 2022-09-06: 2 via RESPIRATORY_TRACT
  Filled 2022-09-06 (×2): qty 12

## 2022-09-06 MED ORDER — ONDANSETRON HCL 4 MG/2ML IJ SOLN: 4.0000 mg | Freq: Four times a day (QID) | INTRAMUSCULAR | Status: DC | PRN

## 2022-09-06 MED ORDER — PANTOPRAZOLE SODIUM 40 MG PO TBEC
40.0000 mg | DELAYED_RELEASE_TABLET | Freq: Two times a day (BID) | ORAL | Status: DC
  Administered 2022-09-06 – 2022-09-07 (×3): 40 mg via ORAL
  Filled 2022-09-06 (×3): qty 1

## 2022-09-06 MED ORDER — ACETAMINOPHEN 325 MG PO TABS: 650.0000 mg | ORAL_TABLET | Freq: Four times a day (QID) | ORAL | Status: DC | PRN

## 2022-09-06 MED ORDER — LORATADINE 10 MG PO TABS
10.0000 mg | ORAL_TABLET | Freq: Every day | ORAL | Status: DC
  Administered 2022-09-06 – 2022-09-07 (×2): 10 mg via ORAL
  Filled 2022-09-06 (×2): qty 1

## 2022-09-06 MED ORDER — ACETAMINOPHEN 650 MG RE SUPP: 650.0000 mg | Freq: Four times a day (QID) | RECTAL | Status: DC | PRN

## 2022-09-06 MED ORDER — DOCUSATE SODIUM 100 MG PO CAPS
100.0000 mg | ORAL_CAPSULE | Freq: Every day | ORAL | Status: DC
  Administered 2022-09-06 – 2022-09-07 (×2): 100 mg via ORAL
  Filled 2022-09-06 (×2): qty 1

## 2022-09-06 MED ORDER — AMIODARONE HCL 200 MG PO TABS
200.0000 mg | ORAL_TABLET | Freq: Every day | ORAL | Status: DC
  Administered 2022-09-06 – 2022-09-07 (×2): 200 mg via ORAL
  Filled 2022-09-06 (×2): qty 1

## 2022-09-06 MED ORDER — ONDANSETRON HCL 4 MG PO TABS: 4.0000 mg | ORAL_TABLET | Freq: Four times a day (QID) | ORAL | Status: DC | PRN

## 2022-09-06 MED ORDER — APIXABAN 2.5 MG PO TABS
2.5000 mg | ORAL_TABLET | Freq: Two times a day (BID) | ORAL | Status: DC
  Administered 2022-09-06 – 2022-09-07 (×3): 2.5 mg via ORAL
  Filled 2022-09-06 (×3): qty 1

## 2022-09-06 NOTE — Plan of Care (Signed)
  Problem: Education: Goal: Knowledge of General Education information will improve Description: Including pain rating scale, medication(s)/side effects and non-pharmacologic comfort measures Outcome: Progressing   Problem: Health Behavior/Discharge Planning: Goal: Ability to manage health-related needs will improve Outcome: Progressing   Problem: Clinical Measurements: Goal: Respiratory complications will improve Outcome: Progressing   Problem: Clinical Measurements: Goal: Cardiovascular complication will be avoided Outcome: Progressing   Problem: Nutrition: Goal: Adequate nutrition will be maintained Outcome: Progressing   Problem: Pain Managment: Goal: General experience of comfort will improve Outcome: Progressing   Problem: Safety: Goal: Ability to remain free from injury will improve Outcome: Progressing   Problem: Skin Integrity: Goal: Risk for impaired skin integrity will decrease Outcome: Progressing   

## 2022-09-06 NOTE — Plan of Care (Signed)
Patient discussed with Dr. Preston Fleeting and Imogene Burn. She presented with a brief period of dyspnea, no chest pain. Troponins marginal and no higher than all prior values. There was concern about a "delta" per Dr. Preston Fleeting, but I would not consider this a significant rise. She sees Dr. Clifton James and is not felt to be an appropriate candidate for any further invasive therapies/evaluation, and it does not seem that the patient is interested either. No invasive cardiac work-up is indicated. Dr. Imogene Burn in agreement.

## 2022-09-06 NOTE — ED Notes (Signed)
ED TO INPATIENT HANDOFF REPORT  ED Nurse Name and Phone #: Jodie Echevaria [phone]  S Name/Age/Gender [name] 89 y.o. female Room/Bed: 036C/036C  Code Status   Code Status: DNR  Home/SNF/Other Home Patient oriented to: self, place, time, and situation Is this baseline? Yes   Triage Complete: Triage complete  Chief Complaint Elevated troponin I level [R79.89]  Triage Note Pt BIB EMS from home for ShOB, resolved while enroute to ED. Hx of CHF and asthma. Denies chest pain. EMS VS: 202/100 HR 80 96% RA CBG 110    Allergies Allergies  Allergen Reactions   Other Other (See Comments)    NO ACIDIC, TART< OR SPICY FOODS- DEVELOPS REFLUX OFTEN!!  PATIENT HAS TROUBLE SWALLOWING TABLETS!!   Bactrim [Sulfamethoxazole-Trimethoprim] Other (See Comments)    "makes her feel funny" or "unsteady"    Amitriptyline Hcl Rash   Aspirin Hives, Swelling, Rash and Other (See Comments)    Body became swollen   Tape Other (See Comments)    SKIN IS VERY THIN- WILL TEAR EASILY!!   Zetia [Ezetimibe] Rash    Level of Care/Admitting Diagnosis ED Disposition     ED Disposition  Admit   Condition  --   Comment  Hospital Area: [facility] [[id number]]  Level of Care: Telemetry Medical [104]  May place patient in observation at [facility] or [facility] if equivalent level of care is available:: No  Covid Evaluation: Asymptomatic - no recent exposure (last 10 days) testing not required  Diagnosis: Elevated troponin I level [952841]  Admitting Physician: Imogene Burn, ERIC [[id number]]  Attending Physician: Imogene Burn, ERIC [[id number]]          B Medical/Surgery History Past Medical History:  Diagnosis Date   Anxiety    Aortic Stenosis s/p TAVR    Echo 10/21: EF 55-60, no RWMA, mild asymmetric LVH, normal RVSF, RVSP 40.2 mmHg, mild MR, s/p TAVR with mean gradient 8.46mmHg, no PVL   Arthritis    OSTEO   Aspirin allergy    on Plavix   Asthma    CAD (coronary artery disease)    a. s/p CABG 2006  with Cox-Maze procedure.   Carotid artery disease    a. s/p R CEA.   Diastolic dysfunction    Eczema    Fibromyalgia    GERD (gastroesophageal reflux disease)    Hiatal hernia    Hyperlipidemia    Hypertension    Kyphoscoliosis    Mitral regurgitation    PAF (paroxysmal atrial fibrillation)    a. pt has h/o hematuria on Eliquis and has since refused anticoagulation   Pneumonia 2015 ?   Pulmonary regurgitation    Skin cancer of arm    Stroke    Tricuspid regurgitation    Past Surgical History:  Procedure Laterality Date   ABDOMINAL HYSTERECTOMY  1976   CAROTID ENDARTERECTOMY  2010   CORONARY ANGIOPLASTY WITH STENT PLACEMENT     CORONARY ARTERY BYPASS GRAFT  01/2005   LIMA-D1; SVG-LAD; SVG-OM; SVG-PDA   ERCP N/A 12/28/2021   Procedure: ENDOSCOPIC RETROGRADE CHOLANGIOPANCREATOGRAPHY (ERCP);  Surgeon: Jeani Hawking, MD;  Location[facility] ENDOSCOPY;  Service: Gastroenterology;  Laterality: N/A;   ESOPHAGOGASTRODUODENOSCOPY (EGD) WITH PROPOFOL N/A 03/19/2021   Procedure: ESOPHAGOGASTRODUODENOSCOPY (EGD) WITH PROPOFOL;  Surgeon: Jeani Hawking, MD;  Location: [facility] ENDOSCOPY;  Service: Endoscopy;  Laterality: N/A;   EYE SURGERY     MULTIPLE EXTRACTIONS WITH ALVEOLOPLASTY  12/28/2016   Extraction of tooth #'s 2- 5,7-10, 32,44,01-02,VOZ 22-29 with alveoloplasty and maxillary  right and left lateral exostoses reductions   MULTIPLE EXTRACTIONS WITH ALVEOLOPLASTY N/A 12/28/2016   Procedure: Extraction of tooth #'s 2- 5,7-10, 12,13,17-20,and 22-29 with alveoloplasty and maxillary right and left lateral exostoses reductions;  Surgeon: Charlynne Pander, DDS;  Location: [facility] OR;  Service: Oral Surgery;  Laterality: N/A;   PACEMAKER IMPLANT N/A 01/15/2021   Procedure: PACEMAKER IMPLANT;  Surgeon: Regan Lemming, MD;  Location: [facility] INVASIVE CV LAB;  Service: Cardiovascular;  Laterality: N/A;   REMOVAL OF STONES  12/28/2021   Procedure: REMOVAL OF STONES;  Surgeon: Jeani Hawking, MD;  Location: [facility]  ENDOSCOPY;  Service: Gastroenterology;;   RIGHT/LEFT HEART CATH AND CORONARY/GRAFT ANGIOGRAPHY N/A 10/27/2016   Procedure: Right/Left Heart Cath and Coronary/Graft Angiography;  Surgeon: Kathleene Hazel, MD;  Location: [facility] INVASIVE CV LAB;  Service: Cardiovascular;  Laterality: N/A;   SAVORY DILATION N/A 03/19/2021   Procedure: SAVORY DILATION;  Surgeon: Jeani Hawking, MD;  Location: [facility] ENDOSCOPY;  Service: Endoscopy;  Laterality: N/A;   SPHINCTEROTOMY  12/28/2021   Procedure: SPHINCTEROTOMY;  Surgeon: Jeani Hawking, MD;  Location: [facility] ENDOSCOPY;  Service: Gastroenterology;;   TEE WITHOUT CARDIOVERSION N/A 01/03/2017   Procedure: TRANSESOPHAGEAL ECHOCARDIOGRAM (TEE);  Surgeon: Kathleene Hazel, MD;  Location: [facility] OR;  Service: Open Heart Surgery;  Laterality: N/A;   TONSILLECTOMY     TRANSCATHETER AORTIC VALVE REPLACEMENT, TRANSFEMORAL N/A 01/03/2017   Procedure: TRANSCATHETER AORTIC VALVE REPLACEMENT, TRANSFEMORAL;  Surgeon: Kathleene Hazel, MD;  Location: [facility] OR;  Service: Open Heart Surgery;  Laterality: N/A;   TUMOR REMOVAL       A IV Location/Drains/Wounds Patient Lines/Drains/Airways Status     Active Line/Drains/Airways     Name Placement date Placement time Site Days   Peripheral IV 09/05/22 20 G 1" Right Antecubital 09/05/22  2357  Antecubital  1   Pressure Injury 04/22/20 Sacrum Medial Deep Tissue Pressure Injury - Purple or maroon localized area of discolored intact skin or blood-filled blister due to damage of underlying soft tissue from pressure and/or shear. 04/22/20  2230  -- 867   Pressure Injury 02/19/22 Coccyx Medial;Posterior Stage 1 -  Intact skin with non-blanchable redness of a localized area usually over a bony prominence. 02/19/22  0030  -- 199            Intake/Output Last 24 hours No intake or output data in the 24 hours ending 09/06/22 2058  Labs/Imaging Results for orders placed or performed during the hospital encounter of 09/05/22 (from  the past 48 hour(s))  Comprehensive metabolic panel     Status: Abnormal   Collection Time: 09/06/22 12:02 AM  Result Value Ref Range   Sodium 135 135 - 145 mmol/L   Potassium 3.7 3.5 - 5.1 mmol/L   Chloride 95 (L) 98 - 111 mmol/L   CO2 26 22 - 32 mmol/L   Glucose, Bld 125 (H) 70 - 99 mg/dL    Comment: Glucose reference range applies only to samples taken after fasting for at least 8 hours.   BUN 9 8 - 23 mg/dL   Creatinine, Ser 1.61 0.44 - 1.00 mg/dL   Calcium 9.2 8.9 - 09.6 mg/dL   Total Protein 6.8 6.5 - 8.1 g/dL   Albumin 4.0 3.5 - 5.0 g/dL   AST 21 15 - 41 U/L   ALT 14 0 - 44 U/L   Alkaline Phosphatase 34 (L) 38 - 126 U/L   Total Bilirubin 0.6 0.3 - 1.2 mg/dL   GFR, Estimated >04 >54 mL/min  Comment: (NOTE) Calculated using the CKD-EPI Creatinine Equation (2021)    Anion gap 14 5 - 15    Comment: Performed at [facility], [address], [place], [place] [address]  Brain natriuretic peptide     Status: Abnormal   Collection Time: 09/06/22 12:02 AM  Result Value Ref Range   B Natriuretic Peptide 170.7 (H) 0.0 - 100.0 pg/mL    Comment: Performed at [facility], [address], [place], [place] [address]  Troponin I (High Sensitivity)     Status: Abnormal   Collection Time: 09/06/22 12:02 AM  Result Value Ref Range   Troponin I (High Sensitivity) 27 (H) <18 ng/L    Comment: (NOTE) Elevated high sensitivity troponin I (hsTnI) values and significant  changes across serial measurements may suggest ACS but many other  chronic and acute conditions are known to elevate hsTnI results.  Refer to the "Links" section for chest pain algorithms and additional  guidance. Performed at [facility], [address], [place], [place] [address]   CBC with Differential     Status: None   Collection Time: 09/06/22 12:02 AM  Result Value Ref Range   WBC 8.9 4.0 - 10.5 K/uL   RBC 4.05 3.87 - 5.11 MIL/uL   Hemoglobin 12.7 12.0 - 15.0 g/dL   HCT 91.4 78.2 - 95.6 %    MCV 92.8 80.0 - 100.0 fL   MCH 31.4 26.0 - 34.0 pg   MCHC 33.8 30.0 - 36.0 g/dL   RDW 21.3 08.6 - 57.8 %   Platelets 175 150 - 400 K/uL   nRBC 0.0 0.0 - 0.2 %   Neutrophils Relative % 79 %   Neutro Abs 7.0 1.7 - 7.7 K/uL   Lymphocytes Relative 13 %   Lymphs Abs 1.1 0.7 - 4.0 K/uL   Monocytes Relative 6 %   Monocytes Absolute 0.6 0.1 - 1.0 K/uL   Eosinophils Relative 1 %   Eosinophils Absolute 0.1 0.0 - 0.5 K/uL   Basophils Relative 1 %   Basophils Absolute 0.0 0.0 - 0.1 K/uL   Immature Granulocytes 0 %   Abs Immature Granulocytes 0.02 0.00 - 0.07 K/uL    Comment: Performed at [facility] Lab, [address] [place], [place] [address]  Troponin I (High Sensitivity)     Status: Abnormal   Collection Time: 09/06/22  1:49 AM  Result Value Ref Range   Troponin I (High Sensitivity) 54 (H) <18 ng/L    Comment: DELTA CHECK NOTED RESULT CALLED TO, READ BACK BY AND VERIFIED WITH K. BRANCH RN, [phone], 09/06/22, EADEDOKUN (NOTE) Elevated high sensitivity troponin I (hsTnI) values and significant  changes across serial measurements may suggest ACS but many other  chronic and acute conditions are known to elevate hsTnI results.  Refer to the "Links" section for chest pain algorithms and additional  guidance. Performed at [facility], [address], [place], [place] [address]   Troponin I (High Sensitivity)     Status: Abnormal   Collection Time: 09/06/22  3:57 AM  Result Value Ref Range   Troponin I (High Sensitivity) 97 (H) <18 ng/L    Comment: DELTA CHECK NOTED RESULT CALLED TO, READ BACK BY AND VERIFIED WITH K. BRANCH RN, [phone], 09/06/22, EADEDOKUN (NOTE) Elevated high sensitivity troponin I (hsTnI) values and significant  changes across serial measurements may suggest ACS but many other  chronic and acute conditions are known to elevate hsTnI results.  Refer  to the "Links" section for chest pain algorithms and additional  guidance. Performed at [facility], [address], [place], [place] [address]   Folate     Status: None   Collection Time: 09/06/22  3:17 PM  Result Value Ref Range   Folate 11.6 >5.9 ng/mL    Comment: Performed at [facility], [address], [place], [place] [address]  Troponin I (High Sensitivity)     Status: Abnormal   Collection Time: 09/06/22  3:17 PM  Result Value Ref Range   Troponin I (High Sensitivity) 79 (H) <18 ng/L    Comment: (NOTE) Elevated high sensitivity troponin I (hsTnI) values and significant  changes across serial measurements may suggest ACS but many other  chronic and acute conditions are known to elevate hsTnI results.  Refer to the "Links" section for chest pain algorithms and additional  guidance. Performed at [facility] Lab, [address], [place], [place] [address]    ECHOCARDIOGRAM COMPLETE  Result Date: 09/06/2022    ECHOCARDIOGRAM REPORT   Patient Name:   LATOYNA HIRD Date of Exam: 09/06/2022 Medical Rec #:  [id number]     Height:       62.0 in Accession #:    [id number]    Weight:       91.0 lb Date of Birth:  Oct 23, 1928    BSA:          1.368 m Patient Age:    89 years      BP:           130/57 mmHg Patient Gender: F             HR:           62 bpm. Exam Location:  Inpatient Procedure: 2D Echo, Color Doppler and Cardiac Doppler Indications:    Dyspnea R06.00  History:        Patient has prior history of Echocardiogram examinations, most                 recent 12/28/2021. CAD and Angina, Prior CABG, Stroke and COPD,                 Arrythmias:Atrial Fibrillation, Signs/Symptoms:Shortness of                 Breath and Syncope; Risk Factors:Dyslipidemia.                 Aortic Valve: 23 mm Sapien prosthetic, stented (TAVR) valve is                 present in the aortic position. Procedure Date: 11/20/2016.  Sonographer:    [name] Referring Phys: [id number] ERIC CHEN IMPRESSIONS  1. Left ventricular ejection fraction, by estimation, is 45 to 50%. The left ventricle has mildly decreased  function. The left ventricle demonstrates global hypokinesis. Left ventricular diastolic parameters are indeterminate.  2. Right ventricular systolic function is mildly reduced. The right ventricular size is normal. There is normal pulmonary artery systolic pressure. The estimated right ventricular systolic pressure is 29.4 mmHg.  3. Left atrial size was mildly dilated.  4. The mitral valve is abnormal. Mild to moderate mitral valve regurgitation. Mild mitral stenosis. The mean mitral valve gradient is 3.0 mmHg with average heart rate of 63 bpm.  5. Trivial, central aortic regurgitation. The aortic valve has been repaired/replaced. Aortic valve regurgitation is trivial. There is a 23 mm Sapien prosthetic (TAVR) valve present in the  aortic position. Procedure Date: 11/20/2016.  6. Pulmonic valve regurgitation is moderate.  7. The inferior vena cava is normal in size with greater than 50% respiratory variability, suggesting right atrial pressure of 3 mmHg. Comparison(s): Mitral valve regurgitation and RVSP have improved from prior. This study is more technically difficult. FINDINGS  Left Ventricle: Left ventricular ejection fraction, by estimation, is 45 to 50%. The left ventricle has mildly decreased function. The left ventricle demonstrates global hypokinesis. The left ventricular internal cavity size was normal in size. There is  no left ventricular hypertrophy. Left ventricular diastolic parameters are indeterminate. Right Ventricle: The right ventricular size is normal. No increase in right ventricular wall thickness. Right ventricular systolic function is mildly reduced. There is normal pulmonary artery systolic pressure. The tricuspid regurgitant velocity is 2.57 m/s, and with an assumed right atrial pressure of 3 mmHg, the estimated right ventricular systolic pressure is 29.4 mmHg. Left Atrium: Left atrial size was mildly dilated. Right Atrium: Right atrial size was normal in size. Pericardium: There is no  evidence of pericardial effusion. Mitral Valve: 2D MVA 2.73 cm2. The mitral valve is abnormal. Mild to moderate mitral valve regurgitation. Mild mitral valve stenosis. MV peak gradient, 8.6 mmHg. The mean mitral valve gradient is 3.0 mmHg with average heart rate of 63 bpm. Tricuspid Valve: The tricuspid valve is normal in structure. Tricuspid valve regurgitation is trivial. Aortic Valve: Trivial, central aortic regurgitation. The aortic valve has been repaired/replaced. Aortic valve regurgitation is trivial. Aortic valve mean gradient measures 8.0 mmHg. Aortic valve peak gradient measures 13.2 mmHg. Aortic valve area, by VTI measures 1.71 cm. There is a 23 mm Sapien prosthetic, stented (TAVR) valve present in the aortic position. Procedure Date: 11/20/2016. Pulmonic Valve: The pulmonic valve was not well visualized. Pulmonic valve regurgitation is moderate. Aorta: The aortic root is normal in size and structure. Venous: The inferior vena cava is normal in size with greater than 50% respiratory variability, suggesting right atrial pressure of 3 mmHg. IAS/Shunts: No atrial level shunt detected by color flow Doppler.  LEFT VENTRICLE PLAX 2D LVIDd:         3.70 cm   Diastology LVIDs:         2.90 cm   LV e' medial:   3.56 cm/s LV PW:         0.90 cm   LV E/e' medial: 39.6 LV IVS:        0.90 cm LVOT diam:     1.90 cm LV SV:         65 LV SV Index:   48 LVOT Area:     2.84 cm  RIGHT VENTRICLE         IVC TAPSE (M-mode): 1.2 cm  IVC diam: 1.40 cm LEFT ATRIUM           Index        RIGHT ATRIUM           Index LA diam:      3.10 cm 2.27 cm/m   RA Area:     11.90 cm LA Vol (A4C): 46.7 ml 34.14 ml/m  RA Volume:   26.20 ml  19.15 ml/m  AORTIC VALVE                     PULMONIC VALVE AV Area (Vmax):    1.81 cm      PR End Diast Vel: 7.62 msec AV Area (Vmean):   1.77 cm AV Area (VTI):  1.71 cm AV Vmax:           182.00 cm/s AV Vmean:          125.000 cm/s AV VTI:            0.381 m AV Peak Grad:      13.2 mmHg AV Mean  Grad:      8.0 mmHg LVOT Vmax:         116.00 cm/s LVOT Vmean:        78.167 cm/s LVOT VTI:          0.230 m LVOT/AV VTI ratio: 0.60  AORTA Ao Root diam: 2.90 cm MITRAL VALVE                TRICUSPID VALVE MV Area (PHT): 2.60 cm     TR Peak grad:   26.4 mmHg MV Area VTI:   1.71 cm     TR Vmax:        257.00 cm/s MV Peak grad:  8.6 mmHg MV Mean grad:  3.0 mmHg     SHUNTS MV Vmax:       1.47 m/s     Systemic VTI:  0.23 m MV Vmean:      69.6 cm/s    Systemic Diam: 1.90 cm MV Decel Time: 292 msec MV E velocity: 141.00 cm/s MV A velocity: 39.20 cm/s MV E/A ratio:  3.60 Riley Lam MD Electronically signed by Riley Lam MD Signature Date/Time: 09/06/2022/3:10:38 PM    Final    DG Chest 2 View  Result Date: 09/06/2022 CLINICAL DATA:  Shortness of breath EXAM: CHEST - 2 VIEW COMPARISON:  08/22/2022 FINDINGS: The lungs are hyperinflated with diffuse interstitial prominence. No focal airspace consolidation or pulmonary edema. No pleural effusion or pneumothorax. Normal cardiomediastinal contours. Unchanged appearance of remote median sternotomy, prosthetic valve and CABG markers. Left chest wall pacemaker in unchanged position. IMPRESSION: 1. No active cardiopulmonary disease. 2. COPD. Electronically Signed   By: Deatra Robinson M.D.   On: 09/06/2022 00:20    Pending Labs Unresulted Labs (From admission, onward)     Start     Ordered   09/06/22 1517  Vitamin B12  (Anemia Panel (PNL))  Once,   R        09/06/22 1516   09/06/22 1517  Iron and TIBC  (Anemia Panel (PNL))  Once,   R        09/06/22 1516   09/06/22 1517  Ferritin  (Anemia Panel (PNL))  Once,   R        09/06/22 1516   09/06/22 1517  Reticulocytes  (Anemia Panel (PNL))  Once,   R        09/06/22 1516            Vitals/Pain Today's Vitals   09/06/22 1100 09/06/22 1315 09/06/22 1345 09/06/22 1756  BP:   (!) 116/47 (!) 122/54  Pulse:   64 71  Resp:   20 16  Temp:  98.9 F (37.2 C)    TempSrc: Oral Oral    SpO2:   98%  98%  Weight:      Height:      PainSc:        Isolation Precautions No active isolations  Medications Medications  amiodarone (PACERONE) tablet 200 mg (200 mg Oral Given 09/06/22 1525)  rosuvastatin (CRESTOR) tablet 20 mg (20 mg Oral Given 09/06/22 1525)  docusate sodium (COLACE) capsule 100 mg (100 mg Oral Given 09/06/22 1525)  linaclotide (  LINZESS) capsule 72 mcg (has no administration in time range)  pantoprazole (PROTONIX) EC tablet 40 mg (40 mg Oral Given 09/06/22 1525)  apixaban (ELIQUIS) tablet 2.5 mg (2.5 mg Oral Given 09/06/22 1526)  loratadine (CLARITIN) tablet 10 mg (10 mg Oral Given 09/06/22 1526)  acetaminophen (TYLENOL) tablet 650 mg (has no administration in time range)    Or  acetaminophen (TYLENOL) suppository 650 mg (has no administration in time range)  ondansetron (ZOFRAN) tablet 4 mg (has no administration in time range)    Or  ondansetron (ZOFRAN) injection 4 mg (has no administration in time range)  fluticasone (FLOVENT HFA) 220 MCG/ACT inhaler 2 puff (has no administration in time range)    Mobility walks     Focused Assessments Cardiac Assessment Handoff:    Lab Results  Component Value Date   TROPONINI <0.03 12/01/2015   No results found for: "DDIMER" Does the Patient currently have chest pain? No   , Neuro Assessment Handoff:  Swallow screen pass? No          Neuro Assessment:   Neuro Checks:      Has TPA been given? No If patient is a Neuro Trauma and patient is going to OR before floor call report to 4N Charge nurse: [phone] or [phone]  , Pulmonary Assessment Handoff:  Lung sounds:   O2 Device: Room Air      R Recommendations: See Admitting Provider Note  Report given to:   Additional Notes: N/A

## 2022-09-06 NOTE — Consult Note (Signed)
Hospitalist Consultation History and Physical    PALLAS WAHLERT OZD:[id number] DOB: 1928/10/13 DOA: 09/05/2022  DOS: the patient was seen and examined on 09/05/2022  PCP: Ronnald Nian, MD   Patient coming from: Home  I have personally briefly reviewed patient's old medical records in [facility]  CC: SOB HPI: 89 year old female with a history of COPD, paroxysmal atrial fibrillation on Eliquis, history of sick sinus syndrome status post pacemaker, history of aortic stenosis status post TAVR who presents to the ER today with a 2 to 3-day history of shortness of breath.  Patient does not have any air conditioning in her house.  She states that she has been short of breath.  She does not use a fan because it makes her too cold.  She denies any chest pain.  No fever or chills.  She was brought to the ER by her daughter.  She states that she feels better after being in the ER and the air conditioning.  She has been given no medications here in the ER.  She is already on Eliquis.  She was not even given an aspirin by the ambulance.  On arrival, temp 98.8 heart rate 83 blood pressure 178/102.  100% room air sats.  Repeat blood pressure after 4 hours 138/68 satting 99% on room air.  BNP at baseline at 170  Sodium 135, potassium 3.7, BUN of 9, creatinine 0.6  White count 8.9, hemoglobin 12.7, platelets of 175   Initial troponin was 27, repeat of 54.  She was in the ER last week for complaints of shortness of breath.  At that time her initial troponin was 40 3 repeat was 50.  Chest x-ray shows hyperinflated lungs.  No focal airspace consolidation or pulmonary edema.  She has a pacemaker left chest wall.  EKG shows paced rhythm.  Cardiology office note from Dr. Clifton James from July 04, 2022 states that the patient is not a good candidate for any invasive procedures due to poor functional status and advanced age.  [facility] hospitalist consulted.   ED Course: troponin 27, then 54.  CXR  shows emphyema. BNP baseline 170 for her.  Review of Systems:  Review of Systems  Constitutional: Negative.   HENT: Negative.    Eyes: Negative.   Respiratory:  Positive for shortness of breath. Negative for wheezing.   Cardiovascular:  Negative for chest pain, palpitations and leg swelling.  Gastrointestinal: Negative.   Genitourinary: Negative.   Musculoskeletal: Negative.   Skin: Negative.   Neurological: Negative.   Endo/Heme/Allergies: Negative.   Psychiatric/Behavioral: Negative.    All other systems reviewed and are negative.   Past Medical History:  Diagnosis Date   Anxiety    Aortic Stenosis s/p TAVR    Echo 10/21: EF 55-60, no RWMA, mild asymmetric LVH, normal RVSF, RVSP 40.2 mmHg, mild MR, s/p TAVR with mean gradient 8.31mmHg, no PVL   Arthritis    OSTEO   Aspirin allergy    on Plavix   Asthma    CAD (coronary artery disease)    a. s/p CABG 2006 with Cox-Maze procedure.   Carotid artery disease    a. s/p R CEA.   Diastolic dysfunction    Eczema    Fibromyalgia    GERD (gastroesophageal reflux disease)    Hiatal hernia    Hyperlipidemia    Hypertension    Kyphoscoliosis    Mitral regurgitation    PAF (paroxysmal atrial fibrillation)    a. pt has h/o hematuria on  Eliquis and has since refused anticoagulation   Pneumonia 2015 ?   Pulmonary regurgitation    Skin cancer of arm    Stroke    Tricuspid regurgitation     Past Surgical History:  Procedure Laterality Date   ABDOMINAL HYSTERECTOMY  1976   CAROTID ENDARTERECTOMY  2010   CORONARY ANGIOPLASTY WITH STENT PLACEMENT     CORONARY ARTERY BYPASS GRAFT  01/2005   LIMA-D1; SVG-LAD; SVG-OM; SVG-PDA   ERCP N/A 12/28/2021   Procedure: ENDOSCOPIC RETROGRADE CHOLANGIOPANCREATOGRAPHY (ERCP);  Surgeon: Jeani Hawking, MD;  Location: [facility] ENDOSCOPY;  Service: Gastroenterology;  Laterality: N/A;   ESOPHAGOGASTRODUODENOSCOPY (EGD) WITH PROPOFOL N/A 03/19/2021   Procedure: ESOPHAGOGASTRODUODENOSCOPY (EGD) WITH  PROPOFOL;  Surgeon: Jeani Hawking, MD;  Location: [facility] ENDOSCOPY;  Service: Endoscopy;  Laterality: N/A;   EYE SURGERY     MULTIPLE EXTRACTIONS WITH ALVEOLOPLASTY  12/28/2016   Extraction of tooth #'s 2- 5,7-10, 12,13,17-20,and 22-29 with alveoloplasty and maxillary right and left lateral exostoses reductions   MULTIPLE EXTRACTIONS WITH ALVEOLOPLASTY N/A 12/28/2016   Procedure: Extraction of tooth #'s 2- 5,7-10, 12,13,17-20,and 22-29 with alveoloplasty and maxillary right and left lateral exostoses reductions;  Surgeon: Charlynne Pander, DDS;  Location: [facility] OR;  Service: Oral Surgery;  Laterality: N/A;   PACEMAKER IMPLANT N/A 01/15/2021   Procedure: PACEMAKER IMPLANT;  Surgeon: Regan Lemming, MD;  Location: [facility] INVASIVE CV LAB;  Service: Cardiovascular;  Laterality: N/A;   REMOVAL OF STONES  12/28/2021   Procedure: REMOVAL OF STONES;  Surgeon: Jeani Hawking, MD;  Location: [facility] ENDOSCOPY;  Service: Gastroenterology;;   RIGHT/LEFT HEART CATH AND CORONARY/GRAFT ANGIOGRAPHY N/A 10/27/2016   Procedure: Right/Left Heart Cath and Coronary/Graft Angiography;  Surgeon: Kathleene Hazel, MD;  Location: [facility] INVASIVE CV LAB;  Service: Cardiovascular;  Laterality: N/A;   SAVORY DILATION N/A 03/19/2021   Procedure: SAVORY DILATION;  Surgeon: Jeani Hawking, MD;  Location: [facility] ENDOSCOPY;  Service: Endoscopy;  Laterality: N/A;   SPHINCTEROTOMY  12/28/2021   Procedure: SPHINCTEROTOMY;  Surgeon: Jeani Hawking, MD;  Location: [facility] ENDOSCOPY;  Service: Gastroenterology;;   TEE WITHOUT CARDIOVERSION N/A 01/03/2017   Procedure: TRANSESOPHAGEAL ECHOCARDIOGRAM (TEE);  Surgeon: Kathleene Hazel, MD;  Location: [facility] OR;  Service: Open Heart Surgery;  Laterality: N/A;   TONSILLECTOMY     TRANSCATHETER AORTIC VALVE REPLACEMENT, TRANSFEMORAL N/A 01/03/2017   Procedure: TRANSCATHETER AORTIC VALVE REPLACEMENT, TRANSFEMORAL;  Surgeon: Kathleene Hazel, MD;  Location: [facility] OR;  Service: Open Heart Surgery;  Laterality: N/A;    TUMOR REMOVAL       reports that she has never smoked. She quit smokeless tobacco use about 21 years ago.  Her smokeless tobacco use included snuff. She reports that she does not drink alcohol and does not use drugs.  Allergies  Allergen Reactions   Other Other (See Comments)    NO ACIDIC, TART< OR SPICY FOODS- DEVELOPS REFLUX OFTEN!!  PATIENT HAS TROUBLE SWALLOWING TABLETS!!   Bactrim [Sulfamethoxazole-Trimethoprim] Other (See Comments)    "makes her feel funny" or "unsteady"    Amitriptyline Hcl Rash   Aspirin Hives, Swelling, Rash and Other (See Comments)    Body became swollen   Tape Other (See Comments)    SKIN IS VERY THIN- WILL TEAR EASILY!!   Zetia [Ezetimibe] Rash    Family History  Problem Relation Age of Onset   Heart failure Mother    Other Father     Prior to Admission medications   Medication Sig Start Date End Date Taking? Authorizing Provider  acetaminophen (  TYLENOL) 650 MG CR tablet Take 650-1,300 mg by mouth 2 (two) times daily as needed for pain.   Yes [provider]  amiodarone (PACERONE) 200 MG tablet Take 1 tablet (200 mg total) by mouth daily. 02/23/22  Yes Camnitz, Andree Coss, MD  apixaban (ELIQUIS) 2.5 MG TABS tablet Take 1 tablet (2.5 mg total) by mouth 2 (two) times daily. 05/20/22  Yes Kathleene Hazel, MD  Carboxymethylcellulose Sodium (EYE DROPS OP) Apply 2 drops to eye daily as needed (gritty eyes).   Yes [provider]  Cholecalciferol (VITAMIN D-3) 25 MCG (1000 UT) CAPS Take 1,000 Units by mouth daily.   Yes [provider]  docusate sodium (COLACE) 100 MG capsule Take 100 mg by mouth daily.   Yes [provider]  Ensure (ENSURE) Take 237 mLs by mouth every other day.   Yes [provider]  fluticasone (FLOVENT HFA) 220 MCG/ACT inhaler Inhale into the lungs 2 (two) times daily.   Yes [provider]  LINZESS 72 MCG capsule Take 1 capsule (72 mcg total) by mouth daily. 02/10/21  Yes  Medina-Vargas, Monina C, NP  loratadine (CLARITIN) 10 MG tablet Take 1 tablet (10 mg total) by mouth daily. 02/10/21  Yes Medina-Vargas, Monina C, NP  nitroGLYCERIN (NITROSTAT) 0.4 MG SL tablet DISSOLVE 1 TABLET UNDER THE TONGUE EVERY 5 MINUTES AS NEEDED FOR CHEST PAIN 11/01/21  Yes Kathleene Hazel, MD  pantoprazole (PROTONIX) 40 MG tablet Take 1 tablet (40 mg total) by mouth 2 (two) times daily. 01/26/22  Yes Ronnald Nian, MD  rosuvastatin (CRESTOR) 20 MG tablet TAKE 1 TABLET(20 MG) BY MOUTH DAILY Patient taking differently: Take 20 mg by mouth daily. 07/25/22  Yes Ronnald Nian, MD  VENTOLIN HFA 108 (90 Base) MCG/ACT inhaler INHALE 2 PUFFS INTO THE LUNGS EVERY 6 HOURS AS NEEDED FOR WHEEZING OR SHORTNESS OF BREATH Patient taking differently: Inhale 2 puffs into the lungs every 6 (six) hours as needed for wheezing or shortness of breath. 07/15/21  Yes Ronnald Nian, MD  Wheat Dextrin (BENEFIBER PO) Take 1 Scoop by mouth daily as needed (fiber).   Yes [provider]  Fluticasone Furoate (ARNUITY ELLIPTA) 100 MCG/ACT AEPB Inhale 1 Inhalation into the lungs daily. Patient not taking: Reported on 09/06/2022 08/25/22   Ronnald Nian, MD    Physical Exam: Vitals:   09/06/22 0230 09/06/22 0300 09/06/22 0400 09/06/22 0403  BP: (!) 113/55 132/60 138/68   Pulse: 66 68 69   Resp: 17 (!) 23 20   Temp:    98.2 F (36.8 C)  TempSrc:    Oral  SpO2: 99% 99% 99%   Weight:      Height:        Physical Exam Vitals and nursing note reviewed.  Constitutional:      General: She is not in acute distress.    Appearance: She is not toxic-appearing.     Comments: Appears chronically ill and frail  HENT:     Head: Normocephalic and atraumatic.     Nose: Nose normal.  Eyes:     General: No scleral icterus. Cardiovascular:     Rate and Rhythm: Normal rate and regular rhythm.     Pulses: Normal pulses.     Heart sounds: Murmur heard.  Pulmonary:     Effort: Pulmonary effort is normal.  No respiratory distress.     Breath sounds: No wheezing.  Abdominal:     General: Abdomen is flat. Bowel sounds  are normal. There is no distension.     Palpations: Abdomen is soft.  Musculoskeletal:     Right lower leg: No edema.     Left lower leg: No edema.  Skin:    General: Skin is warm and dry.     Capillary Refill: Capillary refill takes less than 2 seconds.  Neurological:     General: No focal deficit present.     Mental Status: She is alert and oriented to person, place, and time.      Labs on Admission: I have personally reviewed following labs and imaging studies  CBC: Recent Labs  Lab 09/06/22 0002  WBC 8.9  NEUTROABS 7.0  HGB 12.7  HCT 37.6  MCV 92.8  PLT 175   Basic Metabolic Panel: Recent Labs  Lab 09/06/22 0002  NA 135  K 3.7  CL 95*  CO2 26  GLUCOSE 125*  BUN 9  CREATININE 0.65  CALCIUM 9.2   GFR: Estimated Creatinine Clearance: 29.9 mL/min (by C-G formula based on SCr of 0.65 mg/dL). Liver Function Tests: Recent Labs  Lab 09/06/22 0002  AST 21  ALT 14  ALKPHOS 34*  BILITOT 0.6  PROT 6.8  ALBUMIN 4.0   Cardiac Enzymes: Recent Labs  Lab 09/06/22 0002 09/06/22 0149  TROPONINIHS 27* 54*   BNP (last 3 results) Recent Labs    12/26/21 0443 02/18/22 0015 09/06/22 0002  BNP 291.9* 185.7* 170.7*      Radiological Exams on Admission: I have personally reviewed images DG Chest 2 View  Result Date: 09/06/2022 CLINICAL DATA:  Shortness of breath EXAM: CHEST - 2 VIEW COMPARISON:  08/22/2022 FINDINGS: The lungs are hyperinflated with diffuse interstitial prominence. No focal airspace consolidation or pulmonary edema. No pleural effusion or pneumothorax. Normal cardiomediastinal contours. Unchanged appearance of remote median sternotomy, prosthetic valve and CABG markers. Left chest wall pacemaker in unchanged position. IMPRESSION: 1. No active cardiopulmonary disease. 2. COPD. Electronically Signed   By: Deatra Robinson M.D.   On:  09/06/2022 00:20    EKG: My personal interpretation of EKG shows: paced rhythm    Assessment/Plan Active Problems:   SOB (shortness of breath)   Elevated troponin    Assessment and Plan: Elevated troponin Patient with a paced rhythm on EKG.  Her initial troponin of of 27 and the second of 54 really is not significant.  Patient actually has no complaints of chest pain.  Aside from a large STEMI, she would not be a candidate for any sort of invasive procedures.  This is already been outlined quite well by Dr. Karie Schwalbe note in February 2024.  SOB (shortness of breath) Likely caused by her chronic COPD and the recent increase in warm weather without the patient having an air conditioner.  This has been a known issue for her since August 2023.  Patient and her daughter both state that they have air conditioners for the rooms but they have not been installed into the windows yet daughter states that the person that was supposed to install these air conditioners into her room recently had surgery.  Daughter states they are not sure when their handyman will be able to install the air conditioners.  Patient states that she feels better sitting in the air conditioning ER room.  She has been given no medications here in the ER.  No DuoNebs, no aspirin, etc.  Patient talks quite a lot.  Room air saturations stay above 96% even while talking.  My recommendations is for patient to  get her window air conditioning units installed.    Code Status: DNR/DNI(Do NOT Intubate). Verified [employer] forms Family Communication: discussed with pt and dtr [name] at bedside  Disposition Plan: awaiting 3rd troponin. Discussed with cards. They do not feel pt need to be admitted and that troponin of  27-->54 is not significant as well Consults called: cardiology    Carollee Herter, DO [facility] 09/06/2022, 4:23 AM

## 2022-09-06 NOTE — Progress Notes (Signed)
No charge progress note.  89 year old female with a history of COPD, paroxysmal atrial fibrillation on Eliquis, history of sick sinus syndrome status post pacemaker, history of aortic stenosis status post TAVR who presents to the ER today with a 2 to 3-day history of shortness of breath.   On arrival, temp 98.8 heart rate 83 blood pressure 178/102.  100% room air sats.  Repeat blood pressure after 4 hours 138/68 satting 99% on room air.  BNP at baseline at 170  Sodium 135, potassium 3.7, BUN of 9, creatinine 0.6  White count 8.9, hemoglobin 12.7, platelets of 175 Troponin 27>>54>>97 Chest x-ray shows hyperinflated lungs.  No focal airspace consolidation or pulmonary edema.  She has a pacemaker left chest wall.   EKG shows paced rhythm.   Cardiology office note from Dr. Clifton James from July 04, 2022 states that the patient is not a good candidate for any invasive procedures due to poor functional status and advanced age.  Echocardiogram was without any acute abnormality, did show EF of 40 to 45% which is chronic.  No new abnormalities.  Patient continued to complain about some shortness of breath but appears quite comfortable.  She was complaining more about nonfunctioning air conditioning at home.  On exam she is a frail elderly lady with clear chest and no peripheral edema.  She would like to spend another night as does not want to go back to that house in the evening and also again talking about her air conditioning.  Discussed with daughter at bedside and we will discharge her tomorrow morning.  Ordered another repeat troponin to see a downward trend.

## 2022-09-06 NOTE — Progress Notes (Signed)
Echocardiogram 2D Echocardiogram has been performed.  [name] 09/06/2022, 2:48 PM

## 2022-09-06 NOTE — Assessment & Plan Note (Signed)
Remains on amiodarone and eliquis.

## 2022-09-06 NOTE — ED Notes (Signed)
Echo at bedside

## 2022-09-06 NOTE — Assessment & Plan Note (Signed)
Continue inhalers

## 2022-09-06 NOTE — Assessment & Plan Note (Addendum)
Observation tele bed.  Patient with a paced rhythm on EKG.  Her initial troponin of of 27, second of 54, 3rd 97.  I still don't think they are really significant.  Patient actually has no complaints of chest pain.  Aside from a large STEMI, she would not be a candidate for any sort of invasive procedures.  This is already been outlined quite well by Dr. Karie Schwalbe note in February 2024.  Check echo. If stable, discharge to home

## 2022-09-06 NOTE — Assessment & Plan Note (Signed)
Verified DNR via [employer].

## 2022-09-06 NOTE — Subjective & Objective (Signed)
CC: SOB HPI: 89 year old female with a history of COPD, paroxysmal atrial fibrillation on Eliquis, history of sick sinus syndrome status post pacemaker, history of aortic stenosis status post TAVR who presents to the ER today with a 2 to 3-day history of shortness of breath.  Patient does not have any air conditioning in her house.  She states that she has been short of breath.  She does not use a fan because it makes her too cold.  She denies any chest pain.  No fever or chills.  She was brought to the ER by her daughter.  She states that she feels better after being in the ER and the air conditioning.  She has been given no medications here in the ER.  She is already on Eliquis.  She was not even given an aspirin by the ambulance.  On arrival, temp 98.8 heart rate 83 blood pressure 178/102.  100% room air sats.  Repeat blood pressure after 4 hours 138/68 satting 99% on room air.  BNP at baseline at 170  Sodium 135, potassium 3.7, BUN of 9, creatinine 0.6  White count 8.9, hemoglobin 12.7, platelets of 175   Initial troponin was 27, repeat of 54.  She was in the ER last week for complaints of shortness of breath.  At that time her initial troponin was 40 3 repeat was 50.  Chest x-ray shows hyperinflated lungs.  No focal airspace consolidation or pulmonary edema.  She has a pacemaker left chest wall.  EKG shows paced rhythm.  Cardiology office note from Dr. Clifton James from July 04, 2022 states that the patient is not a good candidate for any invasive procedures due to poor functional status and advanced age.  [facility] hospitalist consulted.

## 2022-09-06 NOTE — H&P (Signed)
History and Physical    KORENA NASS WUJ:[id number] DOB: 01/26/1929 DOA: 09/05/2022  DOS: the patient was seen and examined on 09/05/2022  PCP: Ronnald Nian, MD   Patient coming from: Home  I have personally briefly reviewed patient's old medical records in [place] [facility]  CC: SOB HPI: 89 year old female with a history of COPD, paroxysmal atrial fibrillation on Eliquis, history of sick sinus syndrome status post pacemaker, history of aortic stenosis status post TAVR who presents to the ER today with a 2 to 3-day history of shortness of breath.  Patient does not have any air conditioning in her house.  She states that she has been short of breath.  She does not use a fan because it makes her too cold.  She denies any chest pain.  No fever or chills.  She was brought to the ER by her daughter.  She states that she feels better after being in the ER and the air conditioning.  She has been given no medications here in the ER.  She is already on Eliquis.  She was not even given an aspirin by the ambulance.  On arrival, temp 98.8 heart rate 83 blood pressure 178/102.  100% room air sats.  Repeat blood pressure after 4 hours 138/68 satting 99% on room air.  BNP at baseline at 170  Sodium 135, potassium 3.7, BUN of 9, creatinine 0.6  White count 8.9, hemoglobin 12.7, platelets of 175   Initial troponin was 27, repeat of 54.  She was in the ER last week for complaints of shortness of breath.  At that time her initial troponin was 40 3 repeat was 50.  Chest x-ray shows hyperinflated lungs.  No focal airspace consolidation or pulmonary edema.  She has a pacemaker left chest wall.  EKG shows paced rhythm.  Cardiology office note from Dr. Clifton James from July 04, 2022 states that the patient is not a good candidate for any invasive procedures due to poor functional status and advanced age.  [facility] hospitalist consulted.   ED Course: troponin 27, 54, 97.    Review of Systems:   Review of Systems  Constitutional: Negative.   HENT: Negative.    Eyes: Negative.   Respiratory:  Positive for shortness of breath.   Cardiovascular:  Negative for chest pain, palpitations, orthopnea and leg swelling.  Gastrointestinal: Negative.   Genitourinary: Negative.   Musculoskeletal: Negative.   Skin: Negative.   Neurological: Negative.   Endo/Heme/Allergies: Negative.   Psychiatric/Behavioral: Negative.    All other systems reviewed and are negative.   Past Medical History:  Diagnosis Date   Anxiety    Aortic Stenosis s/p TAVR    Echo 10/21: EF 55-60, no RWMA, mild asymmetric LVH, normal RVSF, RVSP 40.2 mmHg, mild MR, s/p TAVR with mean gradient 8.65mmHg, no PVL   Arthritis    OSTEO   Aspirin allergy    on Plavix   Asthma    CAD (coronary artery disease)    a. s/p CABG 2006 with Cox-Maze procedure.   Carotid artery disease    a. s/p R CEA.   Diastolic dysfunction    Eczema    Fibromyalgia    GERD (gastroesophageal reflux disease)    Hiatal hernia    Hyperlipidemia    Hypertension    Kyphoscoliosis    Mitral regurgitation    PAF (paroxysmal atrial fibrillation)    a. pt has h/o hematuria on Eliquis and has since refused anticoagulation   Pneumonia 2015 ?  Pulmonary regurgitation    Skin cancer of arm    Stroke    Tricuspid regurgitation     Past Surgical History:  Procedure Laterality Date   ABDOMINAL HYSTERECTOMY  1976   CAROTID ENDARTERECTOMY  2010   CORONARY ANGIOPLASTY WITH STENT PLACEMENT     CORONARY ARTERY BYPASS GRAFT  01/2005   LIMA-D1; SVG-LAD; SVG-OM; SVG-PDA   ERCP N/A 12/28/2021   Procedure: ENDOSCOPIC RETROGRADE CHOLANGIOPANCREATOGRAPHY (ERCP);  Surgeon: Jeani Hawking, MD;  Location: [facility] ENDOSCOPY;  Service: Gastroenterology;  Laterality: N/A;   ESOPHAGOGASTRODUODENOSCOPY (EGD) WITH PROPOFOL N/A 03/19/2021   Procedure: ESOPHAGOGASTRODUODENOSCOPY (EGD) WITH PROPOFOL;  Surgeon: Jeani Hawking, MD;  Location: [facility] ENDOSCOPY;  Service:  Endoscopy;  Laterality: N/A;   EYE SURGERY     MULTIPLE EXTRACTIONS WITH ALVEOLOPLASTY  12/28/2016   Extraction of tooth #'s 2- 5,7-10, 12,13,17-20,and 22-29 with alveoloplasty and maxillary right and left lateral exostoses reductions   MULTIPLE EXTRACTIONS WITH ALVEOLOPLASTY N/A 12/28/2016   Procedure: Extraction of tooth #'s 2- 5,7-10, 12,13,17-20,and 22-29 with alveoloplasty and maxillary right and left lateral exostoses reductions;  Surgeon: Charlynne Pander, DDS;  Location: [facility] OR;  Service: Oral Surgery;  Laterality: N/A;   PACEMAKER IMPLANT N/A 01/15/2021   Procedure: PACEMAKER IMPLANT;  Surgeon: Regan Lemming, MD;  Location: [facility] INVASIVE CV LAB;  Service: Cardiovascular;  Laterality: N/A;   REMOVAL OF STONES  12/28/2021   Procedure: REMOVAL OF STONES;  Surgeon: Jeani Hawking, MD;  Location: [facility] ENDOSCOPY;  Service: Gastroenterology;;   RIGHT/LEFT HEART CATH AND CORONARY/GRAFT ANGIOGRAPHY N/A 10/27/2016   Procedure: Right/Left Heart Cath and Coronary/Graft Angiography;  Surgeon: Kathleene Hazel, MD;  Location: [facility] INVASIVE CV LAB;  Service: Cardiovascular;  Laterality: N/A;   SAVORY DILATION N/A 03/19/2021   Procedure: SAVORY DILATION;  Surgeon: Jeani Hawking, MD;  Location: [facility] ENDOSCOPY;  Service: Endoscopy;  Laterality: N/A;   SPHINCTEROTOMY  12/28/2021   Procedure: SPHINCTEROTOMY;  Surgeon: Jeani Hawking, MD;  Location: [facility] ENDOSCOPY;  Service: Gastroenterology;;   TEE WITHOUT CARDIOVERSION N/A 01/03/2017   Procedure: TRANSESOPHAGEAL ECHOCARDIOGRAM (TEE);  Surgeon: Kathleene Hazel, MD;  Location: [facility] OR;  Service: Open Heart Surgery;  Laterality: N/A;   TONSILLECTOMY     TRANSCATHETER AORTIC VALVE REPLACEMENT, TRANSFEMORAL N/A 01/03/2017   Procedure: TRANSCATHETER AORTIC VALVE REPLACEMENT, TRANSFEMORAL;  Surgeon: Kathleene Hazel, MD;  Location: [facility] OR;  Service: Open Heart Surgery;  Laterality: N/A;   TUMOR REMOVAL       reports that she has never smoked. She quit  smokeless tobacco use about 21 years ago.  Her smokeless tobacco use included snuff. She reports that she does not drink alcohol and does not use drugs.  Allergies  Allergen Reactions   Other Other (See Comments)    NO ACIDIC, TART< OR SPICY FOODS- DEVELOPS REFLUX OFTEN!!  PATIENT HAS TROUBLE SWALLOWING TABLETS!!   Bactrim [Sulfamethoxazole-Trimethoprim] Other (See Comments)    "makes her feel funny" or "unsteady"    Amitriptyline Hcl Rash   Aspirin Hives, Swelling, Rash and Other (See Comments)    Body became swollen   Tape Other (See Comments)    SKIN IS VERY THIN- WILL TEAR EASILY!!   Zetia [Ezetimibe] Rash    Family History  Problem Relation Age of Onset   Heart failure Mother    Other Father     Prior to Admission medications   Medication Sig Start Date End Date Taking? Authorizing Provider  acetaminophen (TYLENOL) 650 MG CR tablet Take 650-1,300 mg by mouth 2 (two) times  daily as needed for pain.   Yes [provider]  amiodarone (PACERONE) 200 MG tablet Take 1 tablet (200 mg total) by mouth daily. 02/23/22  Yes Camnitz, Andree Coss, MD  apixaban (ELIQUIS) 2.5 MG TABS tablet Take 1 tablet (2.5 mg total) by mouth 2 (two) times daily. 05/20/22  Yes Kathleene Hazel, MD  Carboxymethylcellulose Sodium (EYE DROPS OP) Apply 2 drops to eye daily as needed (gritty eyes).   Yes [provider]  Cholecalciferol (VITAMIN D-3) 25 MCG (1000 UT) CAPS Take 1,000 Units by mouth daily.   Yes [provider]  docusate sodium (COLACE) 100 MG capsule Take 100 mg by mouth daily.   Yes [provider]  Ensure (ENSURE) Take 237 mLs by mouth every other day.   Yes [provider]  fluticasone (FLOVENT HFA) 220 MCG/ACT inhaler Inhale into the lungs 2 (two) times daily.   Yes [provider]  LINZESS 72 MCG capsule Take 1 capsule (72 mcg total) by mouth daily. 02/10/21  Yes Medina-Vargas, Monina C, NP  loratadine (CLARITIN) 10 MG tablet Take  1 tablet (10 mg total) by mouth daily. 02/10/21  Yes Medina-Vargas, Monina C, NP  nitroGLYCERIN (NITROSTAT) 0.4 MG SL tablet DISSOLVE 1 TABLET UNDER THE TONGUE EVERY 5 MINUTES AS NEEDED FOR CHEST PAIN 11/01/21  Yes Kathleene Hazel, MD  pantoprazole (PROTONIX) 40 MG tablet Take 1 tablet (40 mg total) by mouth 2 (two) times daily. 01/26/22  Yes Ronnald Nian, MD  rosuvastatin (CRESTOR) 20 MG tablet TAKE 1 TABLET(20 MG) BY MOUTH DAILY Patient taking differently: Take 20 mg by mouth daily. 07/25/22  Yes Ronnald Nian, MD  VENTOLIN HFA 108 (90 Base) MCG/ACT inhaler INHALE 2 PUFFS INTO THE LUNGS EVERY 6 HOURS AS NEEDED FOR WHEEZING OR SHORTNESS OF BREATH Patient taking differently: Inhale 2 puffs into the lungs every 6 (six) hours as needed for wheezing or shortness of breath. 07/15/21  Yes Ronnald Nian, MD  Wheat Dextrin (BENEFIBER PO) Take 1 Scoop by mouth daily as needed (fiber).   Yes [provider]  Fluticasone Furoate (ARNUITY ELLIPTA) 100 MCG/ACT AEPB Inhale 1 Inhalation into the lungs daily. Patient not taking: Reported on 09/06/2022 08/25/22   Ronnald Nian, MD    Physical Exam: Vitals:   09/06/22 0400 09/06/22 0403 09/06/22 0430 09/06/22 0500  BP: 138/68  128/60 124/61  Pulse: 69  66 65  Resp: 20  (!) 23 (!) 25  Temp:  98.2 F (36.8 C)    TempSrc:  Oral    SpO2: 99%  100% 99%  Weight:      Height:        Physical Exam Vitals and nursing note reviewed.  Constitutional:      General: She is not in acute distress.    Appearance: She is not toxic-appearing or diaphoretic.  HENT:     Head: Normocephalic and atraumatic.  Cardiovascular:     Rate and Rhythm: Normal rate and regular rhythm.     Heart sounds: Murmur heard.  Pulmonary:     Effort: Pulmonary effort is normal.  Abdominal:     General: Abdomen is flat. Bowel sounds are normal. There is no distension.  Skin:    General: Skin is warm and dry.     Capillary Refill: Capillary refill takes less than 2  seconds.  Neurological:     General: No focal deficit present.     Mental Status: She is alert. She is disoriented.  Comments: Hard of hearing      Labs on Admission: I have personally reviewed following labs and imaging studies  CBC: Recent Labs  Lab 09/06/22 0002  WBC 8.9  NEUTROABS 7.0  HGB 12.7  HCT 37.6  MCV 92.8  PLT 175   Basic Metabolic Panel: Recent Labs  Lab 09/06/22 0002  NA 135  K 3.7  CL 95*  CO2 26  GLUCOSE 125*  BUN 9  CREATININE 0.65  CALCIUM 9.2   GFR: Estimated Creatinine Clearance: 29.9 mL/min (by C-G formula based on SCr of 0.65 mg/dL). Liver Function Tests: Recent Labs  Lab 09/06/22 0002  AST 21  ALT 14  ALKPHOS 34*  BILITOT 0.6  PROT 6.8  ALBUMIN 4.0   No results for input(s): "LIPASE", "AMYLASE" in the last 168 hours. No results for input(s): "AMMONIA" in the last 168 hours. Coagulation Profile: No results for input(s): "INR", "PROTIME" in the last 168 hours. Cardiac Enzymes: Recent Labs  Lab 09/06/22 0002 09/06/22 0149 09/06/22 0357  TROPONINIHS 27* 54* 97*   BNP (last 3 results) Recent Labs    12/26/21 0443 02/18/22 0015 09/06/22 0002  BNP 291.9* 185.7* 170.7*   HbA1C: No results for input(s): "HGBA1C" in the last 72 hours. CBG: No results for input(s): "GLUCAP" in the last 168 hours. Lipid Profile: No results for input(s): "CHOL", "HDL", "LDLCALC", "TRIG", "CHOLHDL", "LDLDIRECT" in the last 72 hours. Thyroid Function Tests: No results for input(s): "TSH", "T4TOTAL", "FREET4", "T3FREE", "THYROIDAB" in the last 72 hours. Anemia Panel: No results for input(s): "VITAMINB12", "FOLATE", "FERRITIN", "TIBC", "IRON", "RETICCTPCT" in the last 72 hours. Urine analysis:    Component Value Date/Time   COLORURINE YELLOW 12/26/2021 2044   APPEARANCEUR CLOUDY (A) 12/26/2021 2044   LABSPEC 1.038 (H) 12/26/2021 2044   LABSPEC 1.010 01/08/2019 1526   PHURINE 5.0 12/26/2021 2044   GLUCOSEU NEGATIVE 12/26/2021 2044   HGBUR  SMALL (A) 12/26/2021 2044   BILIRUBINUR NEGATIVE 12/26/2021 2044   BILIRUBINUR negative 01/08/2019 1526   BILIRUBINUR n 12/05/2011 1627   KETONESUR NEGATIVE 12/26/2021 2044   PROTEINUR 30 (A) 12/26/2021 2044   UROBILINOGEN 0.2 05/19/2014 1601   NITRITE NEGATIVE 12/26/2021 2044   LEUKOCYTESUR NEGATIVE 12/26/2021 2044    Radiological Exams on Admission: I have personally reviewed images DG Chest 2 View  Result Date: 09/06/2022 CLINICAL DATA:  Shortness of breath EXAM: CHEST - 2 VIEW COMPARISON:  08/22/2022 FINDINGS: The lungs are hyperinflated with diffuse interstitial prominence. No focal airspace consolidation or pulmonary edema. No pleural effusion or pneumothorax. Normal cardiomediastinal contours. Unchanged appearance of remote median sternotomy, prosthetic valve and CABG markers. Left chest wall pacemaker in unchanged position. IMPRESSION: 1. No active cardiopulmonary disease. 2. COPD. Electronically Signed   By: Deatra Robinson M.D.   On: 09/06/2022 00:20    EKG: My personal interpretation of EKG shows: paced rhythm     Assessment/Plan Principal Problem:   Elevated troponin Active Problems:   PAF (paroxysmal atrial fibrillation)   SOB (shortness of breath)   DNR (do not resuscitate)   COPD (chronic obstructive pulmonary disease)    Assessment and Plan: * Elevated troponin Observation tele bed.  Patient with a paced rhythm on EKG.  Her initial troponin of of 27, second of 54, 3rd 97.  I still don't think they are really significant.  Patient actually has no complaints of chest pain.  Aside from a large STEMI, she would not be a candidate for any sort of invasive procedures.  This is already been  outlined quite well by Dr. Karie Schwalbe note in February 2024.  Check echo. If stable, discharge to home  COPD (chronic obstructive pulmonary disease) Continue inhalers.  DNR (do not resuscitate) Verified DNR via [facility].  SOB (shortness of breath) Likely caused by her chronic COPD  and the recent increase in warm weather without the patient having an air conditioner.  This has been a known issue for her since August 2023.  Patient and her daughter both state that they have air conditioners for the rooms but they have not been installed into the windows yet daughter states that the person that was supposed to install these air conditioners into her room recently had surgery.  Daughter states they are not sure when their handyman will be able to install the air conditioners.  Patient states that she feels better sitting in the air conditioning ER room.  She has been given no medications here in the ER.  No DuoNebs, no aspirin, etc.  Patient talks quite a lot.  Room air saturations stay above 96% even while talking.  My recommendations is for patient to get her window air conditioning units installed.  PAF (paroxysmal atrial fibrillation) Remains on amiodarone and eliquis.   DVT prophylaxis: Eliquis Code Status: DNR/DNI(Do NOT Intubate) Family Communication: discussed with pt and dtr  Disposition Plan: return home  Consults called: cards  Admission status: Observation, Telemetry bed   Carollee Herter, DO [facility] 09/06/2022, 5:22 AM

## 2022-09-06 NOTE — Assessment & Plan Note (Addendum)
Likely caused by her chronic COPD and the recent increase in warm weather without the patient having an air conditioner.  This has been a known issue for her since August 2023.  Patient and her daughter both state that they have air conditioners for the rooms but they have not been installed into the windows yet daughter states that the person that was supposed to install these air conditioners into her room recently had surgery.  Daughter states they are not sure when their handyman will be able to install the air conditioners.  Patient states that she feels better sitting in the air conditioning ER room.  She has been given no medications here in the ER.  No DuoNebs, no aspirin, etc.  Patient talks quite a lot.  Room air saturations stay above 96% even while talking.  My recommendations is for patient to get her window air conditioning units installed.

## 2022-09-07 DIAGNOSIS — I959 Hypotension, unspecified: Secondary | ICD-10-CM | POA: Diagnosis not present

## 2022-09-07 DIAGNOSIS — R531 Weakness: Secondary | ICD-10-CM | POA: Diagnosis not present

## 2022-09-07 DIAGNOSIS — Z7401 Bed confinement status: Secondary | ICD-10-CM | POA: Diagnosis not present

## 2022-09-07 DIAGNOSIS — I480 Paroxysmal atrial fibrillation: Secondary | ICD-10-CM | POA: Diagnosis not present

## 2022-09-07 DIAGNOSIS — R778 Other specified abnormalities of plasma proteins: Secondary | ICD-10-CM | POA: Diagnosis not present

## 2022-09-07 DIAGNOSIS — R7989 Other specified abnormal findings of blood chemistry: Secondary | ICD-10-CM | POA: Diagnosis not present

## 2022-09-07 DIAGNOSIS — J449 Chronic obstructive pulmonary disease, unspecified: Secondary | ICD-10-CM | POA: Diagnosis not present

## 2022-09-07 MED ORDER — FUROSEMIDE 20 MG PO TABS: 10.0000 mg | ORAL_TABLET | Freq: Every day | ORAL | Status: DC

## 2022-09-07 MED ORDER — HYDROXYZINE HCL 10 MG PO TABS: 10.0000 mg | ORAL_TABLET | Freq: Three times a day (TID) | ORAL | Status: DC | PRN

## 2022-09-07 MED ORDER — FUROSEMIDE 20 MG PO TABS: 10.0000 mg | ORAL_TABLET | Freq: Every day | ORAL | 0 refills | Status: DC | PRN

## 2022-09-07 MED ORDER — HYDROXYZINE HCL 10 MG PO TABS: 10.0000 mg | ORAL_TABLET | Freq: Three times a day (TID) | ORAL | 0 refills | Status: DC | PRN

## 2022-09-07 NOTE — Care Management (Addendum)
  Transition of Care [facility]) Screening Note   Patient Details  Name: [name] Date of Birth: [birth year]   [facility] of [facility] CM/SW Contact:    Gala Lewandowsky, RN Phone Number: 09/07/2022, 3:51 PM    Transition of Care Department [facility]) has reviewed the patient and no TOC needs have been identified at this time. Patient is from home with daughter. Patient asked for hospital bed; however, daughter wants to see if she can go through PCP for order. She will need to measure the room to see if the DME will fit in the room. Daughter to states whether the patient will transition home via car or ambulance. We will continue to monitor patient advancement through interdisciplinary progression rounds. If new patient transition needs arise, please place a TOC consult.   09-07-22 Plan for home with [facility]- called at 1613. ETA arrival for [facility]- an hour.

## 2022-09-07 NOTE — Plan of Care (Signed)
  Problem: Education: Goal: Knowledge of General Education information will improve Description: Including pain rating scale, medication(s)/side effects and non-pharmacologic comfort measures 09/07/2022 1629 by Herma Carson, RN Outcome: Adequate for Discharge 09/07/2022 0735 by Herma Carson, RN Outcome: Progressing   Problem: Health Behavior/Discharge Planning: Goal: Ability to manage health-related needs will improve 09/07/2022 1629 by Herma Carson, RN Outcome: Adequate for Discharge 09/07/2022 0735 by Herma Carson, RN Outcome: Progressing   Problem: Clinical Measurements: Goal: Ability to maintain clinical measurements within normal limits will improve 09/07/2022 1629 by Herma Carson, RN Outcome: Adequate for Discharge 09/07/2022 0735 by Herma Carson, RN Outcome: Progressing Goal: Will remain free from infection 09/07/2022 1629 by Herma Carson, RN Outcome: Adequate for Discharge 09/07/2022 0735 by Herma Carson, RN Outcome: Progressing Goal: Diagnostic test results will improve 09/07/2022 1629 by Herma Carson, RN Outcome: Adequate for Discharge 09/07/2022 0735 by Herma Carson, RN Outcome: Progressing Goal: Respiratory complications will improve 09/07/2022 1629 by Herma Carson, RN Outcome: Adequate for Discharge 09/07/2022 0735 by Herma Carson, RN Outcome: Progressing Goal: Cardiovascular complication will be avoided 09/07/2022 1629 by Herma Carson, RN Outcome: Adequate for Discharge 09/07/2022 0735 by Herma Carson, RN Outcome: Progressing   Problem: Activity: Goal: Risk for activity intolerance will decrease 09/07/2022 1629 by Herma Carson, RN Outcome: Adequate for Discharge 09/07/2022 0735 by Herma Carson, RN Outcome: Progressing   Problem: Nutrition: Goal: Adequate nutrition will be maintained 09/07/2022 1629 by Herma Carson, RN Outcome: Adequate for Discharge 09/07/2022 0735 by Herma Carson, RN Outcome: Progressing    Problem: Coping: Goal: Level of anxiety will decrease 09/07/2022 1629 by Herma Carson, RN Outcome: Adequate for Discharge 09/07/2022 0735 by Herma Carson, RN Outcome: Progressing   Problem: Elimination: Goal: Will not experience complications related to bowel motility 09/07/2022 1629 by Herma Carson, RN Outcome: Adequate for Discharge 09/07/2022 0735 by Herma Carson, RN Outcome: Progressing Goal: Will not experience complications related to urinary retention 09/07/2022 1629 by Herma Carson, RN Outcome: Adequate for Discharge 09/07/2022 0735 by Herma Carson, RN Outcome: Progressing   Problem: Pain Managment: Goal: General experience of comfort will improve 09/07/2022 1629 by Herma Carson, RN Outcome: Adequate for Discharge 09/07/2022 0735 by Herma Carson, RN Outcome: Progressing   Problem: Safety: Goal: Ability to remain free from injury will improve 09/07/2022 1629 by Herma Carson, RN Outcome: Adequate for Discharge 09/07/2022 0735 by Herma Carson, RN Outcome: Progressing   Problem: Skin Integrity: Goal: Risk for impaired skin integrity will decrease 09/07/2022 1629 by Herma Carson, RN Outcome: Adequate for Discharge 09/07/2022 0735 by Herma Carson, RN Outcome: Progressing

## 2022-09-07 NOTE — Progress Notes (Addendum)
      Thank you for this consult. Chart reviewed. Patient assessed at bedside - she is pleasantly confused and not able to participate in GOC discussion. No family at bedside. I attempted to call daughter Dorene Grebe x 2 with no answer. Secure voicemail left requesting a return call.   PMT will attempt to follow-up tomorrow.     Sherlean Foot, NP-C Palliative Medicine   Please call Palliative Medicine team phone with any questions [phone]. For individual providers please see AMION.   No charge

## 2022-09-07 NOTE — Discharge Summary (Signed)
Physician Discharge Summary   Patient: [name] MRN: [id number] DOB: [birth year]  Admit date:     09/05/2022  Discharge date: 09/07/22  Discharge Physician: Kathlen Mody   PCP: Ronnald Nian, MD   Recommendations at discharge:  Please follow up with PCP in 1 to 2 weeks.  Please follow up with cardiology in 1 to 2 weeks.   Discharge Diagnoses: Principal Problem:   Elevated troponin Active Problems:   PAF (paroxysmal atrial fibrillation)   SOB (shortness of breath)   DNR (do not resuscitate)   COPD (chronic obstructive pulmonary disease)   Hospital Course: 89 year old female with a history of COPD, paroxysmal atrial fibrillation on Eliquis, history of sick sinus syndrome status post pacemaker, history of aortic stenosis status post TAVR who presents to the ER today with a 2 to 3-day history of shortness of breath. Patient does not have any air conditioning in her house. She states that she has been short of breath.  Chest x-ray shows hyperinflated lungs. No focal airspace consolidation or pulmonary edema. She has a pacemaker left chest wall.  EKG shows paced rhythm.   Cardiology office note from Dr. Clifton James from July 04, 2022 states that the patient is not a good candidate for any invasive procedures due to poor functional status and advanced age.  Assessment and Plan: * Elevated troponin Observation tele bed.  Patient with a paced rhythm on EKG.  Her initial troponin of of 27, second of 54, 3rd 97 down to 66. .  I still don't think they are really significant.  Patient actually has no complaints of chest pain.  Aside from a large STEMI, she would not be a candidate for any sort of invasive procedures.  This is already been outlined quite well by Dr. Karie Schwalbe note in February 2024. Echocardiogram done and similar to the one last year except MVR have improved from prior.  She was discharged on 10 mg of lasix if she gains >5lb in a week. Discussed in detail with her daughter  at bedside.   COPD (chronic obstructive pulmonary disease) Continue inhalers.  DNR (do not resuscitate) Verified DNR via [facility].  SOB (shortness of breath) Likely caused by her chronic COPD and the recent increase in warm weather without the patient having an air conditioner.  This has been a known issue for her since August 2023.  Patient and her daughter both state that they have air conditioners for the rooms but they have not been installed into the windows yet daughter states that the person that was supposed to install these air conditioners into her room recently had surgery.  Daughter states they are not sure when their handyman will be able to install the air conditioners.  Patient states that she feels better sitting in the air conditioning ER room.  She has been given no medications here in the ER.  No DuoNebs, no aspirin, etc.   Room air saturations stay above 96% even while talking. Daughter requesting to give medications for her anxiety. She will be started on atarax 10 mg TID PRN.    PAF (paroxysmal atrial fibrillation) Remains on amiodarone and eliquis.         Consultants: Cardiology.  Procedures performed: echo  Disposition: Home Diet recommendation:  Discharge Diet Orders (From admission, onward)     Start     Ordered   09/07/22 0000  Diet - low sodium heart healthy        09/07/22 1609  Cardiac diet DISCHARGE MEDICATION: Allergies as of 09/07/2022       Reactions   Other Other (See Comments)   NO ACIDIC, TART< OR SPICY FOODS- DEVELOPS REFLUX OFTEN!! PATIENT HAS TROUBLE SWALLOWING TABLETS!!   Bactrim [sulfamethoxazole-trimethoprim] Other (See Comments)   "makes her feel funny" or "unsteady"   Amitriptyline Hcl Rash   Aspirin Hives, Swelling, Rash, Other (See Comments)   Body became swollen   Tape Other (See Comments)   SKIN IS VERY THIN- WILL TEAR EASILY!!   Zetia [ezetimibe] Rash        Medication List     STOP taking these  medications    Arnuity Ellipta 100 MCG/ACT Aepb Generic drug: Fluticasone Furoate       TAKE these medications    acetaminophen 650 MG CR tablet Commonly known as: TYLENOL Take 650-1,300 mg by mouth 2 (two) times daily as needed for pain.   amiodarone 200 MG tablet Commonly known as: PACERONE Take 1 tablet (200 mg total) by mouth daily.   apixaban 2.5 MG Tabs tablet Commonly known as: Eliquis Take 1 tablet (2.5 mg total) by mouth 2 (two) times daily.   BENEFIBER PO Take 1 Scoop by mouth daily as needed (fiber).   docusate sodium 100 MG capsule Commonly known as: COLACE Take 100 mg by mouth daily.   Ensure Take 237 mLs by mouth every other day.   EYE DROPS OP Apply 2 drops to eye daily as needed (gritty eyes).   fluticasone 220 MCG/ACT inhaler Commonly known as: FLOVENT HFA Inhale into the lungs 2 (two) times daily.   furosemide 20 MG tablet Commonly known as: LASIX Take 0.5 tablets (10 mg total) by mouth daily as needed for fluid or edema (for weight gain >5 lbs).   hydrOXYzine 10 MG tablet Commonly known as: ATARAX Take 1 tablet (10 mg total) by mouth 3 (three) times daily as needed for anxiety.   Linzess 72 MCG capsule Generic drug: linaclotide Take 1 capsule (72 mcg total) by mouth daily.   loratadine 10 MG tablet Commonly known as: CLARITIN Take 1 tablet (10 mg total) by mouth daily.   nitroGLYCERIN 0.4 MG SL tablet Commonly known as: NITROSTAT DISSOLVE 1 TABLET UNDER THE TONGUE EVERY 5 MINUTES AS NEEDED FOR CHEST PAIN   pantoprazole 40 MG tablet Commonly known as: PROTONIX Take 1 tablet (40 mg total) by mouth 2 (two) times daily.   rosuvastatin 20 MG tablet Commonly known as: CRESTOR TAKE 1 TABLET(20 MG) BY MOUTH DAILY What changed: See the new instructions.   Ventolin HFA 108 (90 Base) MCG/ACT inhaler Generic drug: albuterol INHALE 2 PUFFS INTO THE LUNGS EVERY 6 HOURS AS NEEDED FOR WHEEZING OR SHORTNESS OF BREATH What changed:  how much to  take how to take this when to take this reasons to take this additional instructions   Vitamin D-3 25 MCG (1000 UT) Caps Take 1,000 Units by mouth daily.               Durable Medical Equipment  (From admission, onward)           Start     Ordered   09/07/22 1230  For home use only DME Hospital bed  Once       Question Answer Comment  Length of Need Lifetime   The above medical condition requires: Patient requires the ability to reposition frequently   Head must be elevated greater than: 45 degrees   Bed type Semi-electric  09/07/22 1229            Follow-up Information     Ronnald Nian, MD. Schedule an appointment as soon as possible for a visit in 1 week(s).   Specialty: Family Medicine Contact information: [address] [place] [address] [phone]                Discharge Exam: Filed Weights   09/05/22 2346 09/06/22 2200 09/07/22 0500  Weight: 41.3 kg 40.9 kg 40.9 kg   General exam: Appears calm and comfortable  Respiratory system: Clear to auscultation. Respiratory effort normal. Cardiovascular system: S1 & S2 heard, RRR. No JVD,  Gastrointestinal system: Abdomen is nondistended, soft and nontender.  Central nervous system: Alert and oriented. No focal neurological deficits. Extremities: Symmetric 5 x 5 power. Skin: No rashes, lesions or ulcers Psychiatry: Mood & affect appropriate.    Condition at discharge: fair  The results of significant diagnostics from this hospitalization (including imaging, microbiology, ancillary and laboratory) are listed below for reference.   Imaging Studies: ECHOCARDIOGRAM COMPLETE  Result Date: 09/06/2022    ECHOCARDIOGRAM REPORT   Patient Name:   NATASHIA ROSEMAN Date of Exam: 09/06/2022 Medical Rec #:  [id number]     Height:       62.0 in Accession #:    [id number]    Weight:       91.0 lb Date of Birth:  01-Jun-1928    BSA:          1.368 m Patient Age:    89 years      BP:            130/57 mmHg Patient Gender: F             HR:           62 bpm. Exam Location:  Inpatient Procedure: 2D Echo, Color Doppler and Cardiac Doppler Indications:    Dyspnea R06.00  History:        Patient has prior history of Echocardiogram examinations, most                 recent 12/28/2021. CAD and Angina, Prior CABG, Stroke and COPD,                 Arrythmias:Atrial Fibrillation, Signs/Symptoms:Shortness of                 Breath and Syncope; Risk Factors:Dyslipidemia.                 Aortic Valve: 23 mm Sapien prosthetic, stented (TAVR) valve is                 present in the aortic position. Procedure Date: 11/20/2016.  Sonographer:    [name] Referring Phys: [id number] ERIC CHEN IMPRESSIONS  1. Left ventricular ejection fraction, by estimation, is 45 to 50%. The left ventricle has mildly decreased function. The left ventricle demonstrates global hypokinesis. Left ventricular diastolic parameters are indeterminate.  2. Right ventricular systolic function is mildly reduced. The right ventricular size is normal. There is normal pulmonary artery systolic pressure. The estimated right ventricular systolic pressure is 29.4 mmHg.  3. Left atrial size was mildly dilated.  4. The mitral valve is abnormal. Mild to moderate mitral valve regurgitation. Mild mitral stenosis. The mean mitral valve gradient is 3.0 mmHg with average heart rate of 63 bpm.  5. Trivial, central aortic regurgitation. The aortic valve has been repaired/replaced. Aortic valve regurgitation is trivial. There is a 23  mm Sapien prosthetic (TAVR) valve present in the aortic position. Procedure Date: 11/20/2016.  6. Pulmonic valve regurgitation is moderate.  7. The inferior vena cava is normal in size with greater than 50% respiratory variability, suggesting right atrial pressure of 3 mmHg. Comparison(s): Mitral valve regurgitation and RVSP have improved from prior. This study is more technically difficult. FINDINGS  Left Ventricle: Left ventricular ejection  fraction, by estimation, is 45 to 50%. The left ventricle has mildly decreased function. The left ventricle demonstrates global hypokinesis. The left ventricular internal cavity size was normal in size. There is  no left ventricular hypertrophy. Left ventricular diastolic parameters are indeterminate. Right Ventricle: The right ventricular size is normal. No increase in right ventricular wall thickness. Right ventricular systolic function is mildly reduced. There is normal pulmonary artery systolic pressure. The tricuspid regurgitant velocity is 2.57 m/s, and with an assumed right atrial pressure of 3 mmHg, the estimated right ventricular systolic pressure is 29.4 mmHg. Left Atrium: Left atrial size was mildly dilated. Right Atrium: Right atrial size was normal in size. Pericardium: There is no evidence of pericardial effusion. Mitral Valve: 2D MVA 2.73 cm2. The mitral valve is abnormal. Mild to moderate mitral valve regurgitation. Mild mitral valve stenosis. MV peak gradient, 8.6 mmHg. The mean mitral valve gradient is 3.0 mmHg with average heart rate of 63 bpm. Tricuspid Valve: The tricuspid valve is normal in structure. Tricuspid valve regurgitation is trivial. Aortic Valve: Trivial, central aortic regurgitation. The aortic valve has been repaired/replaced. Aortic valve regurgitation is trivial. Aortic valve mean gradient measures 8.0 mmHg. Aortic valve peak gradient measures 13.2 mmHg. Aortic valve area, by VTI measures 1.71 cm. There is a 23 mm Sapien prosthetic, stented (TAVR) valve present in the aortic position. Procedure Date: 11/20/2016. Pulmonic Valve: The pulmonic valve was not well visualized. Pulmonic valve regurgitation is moderate. Aorta: The aortic root is normal in size and structure. Venous: The inferior vena cava is normal in size with greater than 50% respiratory variability, suggesting right atrial pressure of 3 mmHg. IAS/Shunts: No atrial level shunt detected by color flow Doppler.  LEFT  VENTRICLE PLAX 2D LVIDd:         3.70 cm   Diastology LVIDs:         2.90 cm   LV e' medial:   3.56 cm/s LV PW:         0.90 cm   LV E/e' medial: 39.6 LV IVS:        0.90 cm LVOT diam:     1.90 cm LV SV:         65 LV SV Index:   48 LVOT Area:     2.84 cm  RIGHT VENTRICLE         IVC TAPSE (M-mode): 1.2 cm  IVC diam: 1.40 cm LEFT ATRIUM           Index        RIGHT ATRIUM           Index LA diam:      3.10 cm 2.27 cm/m   RA Area:     11.90 cm LA Vol (A4C): 46.7 ml 34.14 ml/m  RA Volume:   26.20 ml  19.15 ml/m  AORTIC VALVE                     PULMONIC VALVE AV Area (Vmax):    1.81 cm      PR End Diast Vel: 7.62 msec AV Area (Vmean):  1.77 cm AV Area (VTI):     1.71 cm AV Vmax:           182.00 cm/s AV Vmean:          125.000 cm/s AV VTI:            0.381 m AV Peak Grad:      13.2 mmHg AV Mean Grad:      8.0 mmHg LVOT Vmax:         116.00 cm/s LVOT Vmean:        78.167 cm/s LVOT VTI:          0.230 m LVOT/AV VTI ratio: 0.60  AORTA Ao Root diam: 2.90 cm MITRAL VALVE                TRICUSPID VALVE MV Area (PHT): 2.60 cm     TR Peak grad:   26.4 mmHg MV Area VTI:   1.71 cm     TR Vmax:        257.00 cm/s MV Peak grad:  8.6 mmHg MV Mean grad:  3.0 mmHg     SHUNTS MV Vmax:       1.47 m/s     Systemic VTI:  0.23 m MV Vmean:      69.6 cm/s    Systemic Diam: 1.90 cm MV Decel Time: 292 msec MV E velocity: 141.00 cm/s MV A velocity: 39.20 cm/s MV E/A ratio:  3.60 Riley Lam MD Electronically signed by Riley Lam MD Signature Date/Time: 09/06/2022/3:10:38 PM    Final    DG Chest 2 View  Result Date: 09/06/2022 CLINICAL DATA:  Shortness of breath EXAM: CHEST - 2 VIEW COMPARISON:  08/22/2022 FINDINGS: The lungs are hyperinflated with diffuse interstitial prominence. No focal airspace consolidation or pulmonary edema. No pleural effusion or pneumothorax. Normal cardiomediastinal contours. Unchanged appearance of remote median sternotomy, prosthetic valve and CABG markers. Left chest wall pacemaker  in unchanged position. IMPRESSION: 1. No active cardiopulmonary disease. 2. COPD. Electronically Signed   By: Deatra Robinson M.D.   On: 09/06/2022 00:20   DG Chest 2 View  Result Date: 08/22/2022 CLINICAL DATA:  Show shortness of breath, chest tightness EXAM: CHEST - 2 VIEW COMPARISON:  Is 02/18/2022 FINDINGS: Hyperinflation. Left pacer remains in place, unchanged. Prior CABG and aortic valve repair. Heart and mediastinal contours are within normal limits. No focal opacities or effusions. No acute bony abnormality. IMPRESSION: Hyperinflation.  No active cardiopulmonary disease. Electronically Signed   By: Charlett Nose M.D.   On: 08/22/2022 00:47    Microbiology: Results for orders placed or performed during the hospital encounter of 01/14/21  Resp Panel by RT-PCR (Flu A&B, Covid) Nasopharyngeal Swab     Status: None   Collection Time: 01/14/21  5:03 PM   Specimen: Nasopharyngeal Swab; Nasopharyngeal(NP) swabs in vial transport medium  Result Value Ref Range Status   SARS Coronavirus 2 by RT PCR NEGATIVE NEGATIVE Final    Comment: (NOTE) SARS-CoV-2 target nucleic acids are NOT DETECTED.  The SARS-CoV-2 RNA is generally detectable in upper respiratory specimens during the acute phase of infection. The lowest concentration of SARS-CoV-2 viral copies this assay can detect is 138 copies/mL. A negative result does not preclude SARS-Cov-2 infection and should not be used as the sole basis for treatment or other patient management decisions. A negative result may occur with  improper specimen collection/handling, submission of specimen other than nasopharyngeal swab, presence of viral mutation(s) within the areas targeted by this assay, and  inadequate number of viral copies(<138 copies/mL). A negative result must be combined with clinical observations, patient history, and epidemiological information. The expected result is Negative.  Fact Sheet for Patients:   BloggerCourse.com  Fact Sheet for Healthcare Providers:  SeriousBroker.it  This test is no t yet approved or cleared by the [place] FDA and  has been authorized for detection and/or diagnosis of SARS-CoV-2 by FDA under an Emergency Use Authorization (EUA). This EUA will remain  in effect (meaning this test can be used) for the duration of the COVID-19 declaration under Section 564(b)(1) of the Act, 21 U.S.C.section 360bbb-3(b)(1), unless the authorization is terminated  or revoked sooner.       Influenza A by PCR NEGATIVE NEGATIVE Final   Influenza B by PCR NEGATIVE NEGATIVE Final    Comment: (NOTE) The Xpert Xpress SARS-CoV-2/FLU/RSV plus assay is intended as an aid in the diagnosis of influenza from Nasopharyngeal swab specimens and should not be used as a sole basis for treatment. Nasal washings and aspirates are unacceptable for Xpert Xpress SARS-CoV-2/FLU/RSV testing.  Fact Sheet for Patients: BloggerCourse.com  Fact Sheet for Healthcare Providers: SeriousBroker.it  This test is not yet approved or cleared by the [place] FDA and has been authorized for detection and/or diagnosis of SARS-CoV-2 by FDA under an Emergency Use Authorization (EUA). This EUA will remain in effect (meaning this test can be used) for the duration of the COVID-19 declaration under Section 564(b)(1) of the Act, 21 U.S.C. section 360bbb-3(b)(1), unless the authorization is terminated or revoked.  Performed at [facility], [address] [place], [place] [address]   SARS CORONAVIRUS 2 (TAT 6-24 HRS) Nasopharyngeal Nasopharyngeal Swab     Status: None   Collection Time: 01/14/21  6:53 PM   Specimen: Nasopharyngeal Swab  Result Value Ref Range Status   SARS Coronavirus 2 NEGATIVE NEGATIVE Final    Comment: (NOTE) SARS-CoV-2 target nucleic acids are NOT DETECTED.  The  SARS-CoV-2 RNA is generally detectable in upper and lower respiratory specimens during the acute phase of infection. Negative results do not preclude SARS-CoV-2 infection, do not rule out co-infections with other pathogens, and should not be used as the sole basis for treatment or other patient management decisions. Negative results must be combined with clinical observations, patient history, and epidemiological information. The expected result is Negative.  Fact Sheet for Patients: HairSlick.no  Fact Sheet for Healthcare Providers: quierodirigir.com  This test is not yet approved or cleared by the [place] FDA and  has been authorized for detection and/or diagnosis of SARS-CoV-2 by FDA under an Emergency Use Authorization (EUA). This EUA will remain  in effect (meaning this test can be used) for the duration of the COVID-19 declaration under Se ction 564(b)(1) of the Act, 21 U.S.C. section 360bbb-3(b)(1), unless the authorization is terminated or revoked sooner.  Performed at [facility], [address] [place], [place] [address]   MRSA Next Gen by PCR, Nasal     Status: None   Collection Time: 01/14/21  8:30 PM   Specimen: Nasal Mucosa; Nasal Swab  Result Value Ref Range Status   MRSA by PCR Next Gen NOT DETECTED NOT DETECTED Final    Comment: (NOTE) The GeneXpert MRSA Assay (FDA approved for NASAL specimens only), is one component of a comprehensive MRSA colonization surveillance program. It is not intended to diagnose MRSA infection nor to guide or monitor treatment for MRSA infections. Test performance is not FDA approved in patients less than  89 years old. Performed at [facility], [address], [place], [place] [address]   Surgical PCR screen     Status: None   Collection Time: 01/15/21 11:45 AM   Specimen: Nasal Mucosa; Nasal Swab  Result Value Ref Range Status   MRSA, PCR NEGATIVE NEGATIVE  Final   Staphylococcus aureus NEGATIVE NEGATIVE Final    Comment: (NOTE) The Xpert SA Assay (FDA approved for NASAL specimens in patients 73 years of age and older), is one component of a comprehensive surveillance program. It is not intended to diagnose infection nor to guide or monitor treatment. Performed at [facility], [address], [place], [place] [address]   SARS CORONAVIRUS 2 (TAT 6-24 HRS) Nasopharyngeal Nasopharyngeal Swab     Status: None   Collection Time: 01/18/21  4:07 PM   Specimen: Nasopharyngeal Swab  Result Value Ref Range Status   SARS Coronavirus 2 NEGATIVE NEGATIVE Final    Comment: (NOTE) SARS-CoV-2 target nucleic acids are NOT DETECTED.  The SARS-CoV-2 RNA is generally detectable in upper and lower respiratory specimens during the acute phase of infection. Negative results do not preclude SARS-CoV-2 infection, do not rule out co-infections with other pathogens, and should not be used as the sole basis for treatment or other patient management decisions. Negative results must be combined with clinical observations, patient history, and epidemiological information. The expected result is Negative.  Fact Sheet for Patients: HairSlick.no  Fact Sheet for Healthcare Providers: quierodirigir.com  This test is not yet approved or cleared by the [place] FDA and  has been authorized for detection and/or diagnosis of SARS-CoV-2 by FDA under an Emergency Use Authorization (EUA). This EUA will remain  in effect (meaning this test can be used) for the duration of the COVID-19 declaration under Se ction 564(b)(1) of the Act, 21 U.S.C. section 360bbb-3(b)(1), unless the authorization is terminated or revoked sooner.  Performed at [facility], [address], [place], [place] [address]     Labs: CBC: Recent Labs  Lab 09/06/22 0002  WBC 8.9  NEUTROABS 7.0  HGB 12.7  HCT 37.6  MCV  92.8  PLT 175   Basic Metabolic Panel: Recent Labs  Lab 09/06/22 0002  NA 135  K 3.7  CL 95*  CO2 26  GLUCOSE 125*  BUN 9  CREATININE 0.65  CALCIUM 9.2   Liver Function Tests: Recent Labs  Lab 09/06/22 0002  AST 21  ALT 14  ALKPHOS 34*  BILITOT 0.6  PROT 6.8  ALBUMIN 4.0   CBG: No results for input(s): "GLUCAP" in the last 168 hours.  Discharge time spent: 39 minutes.   Signed: Kathlen Mody, MD [facility] 09/07/2022

## 2022-09-07 NOTE — Plan of Care (Signed)

## 2022-09-07 NOTE — Care Management Obs Status (Signed)
MEDICARE OBSERVATION STATUS NOTIFICATION   Patient Details  Name: [name] MRN: [id number] Date of Birth: April 13, 1929   Medicare Observation Status Notification Given:  Yes    Gala Lewandowsky, RN 09/07/2022, 3:57 PM

## 2022-09-09 DIAGNOSIS — I110 Hypertensive heart disease with heart failure: Secondary | ICD-10-CM | POA: Diagnosis not present

## 2022-09-09 DIAGNOSIS — E785 Hyperlipidemia, unspecified: Secondary | ICD-10-CM | POA: Diagnosis not present

## 2022-09-09 DIAGNOSIS — J4489 Other specified chronic obstructive pulmonary disease: Secondary | ICD-10-CM | POA: Diagnosis not present

## 2022-09-09 DIAGNOSIS — Z7951 Long term (current) use of inhaled steroids: Secondary | ICD-10-CM | POA: Diagnosis not present

## 2022-09-09 DIAGNOSIS — I495 Sick sinus syndrome: Secondary | ICD-10-CM | POA: Diagnosis not present

## 2022-09-09 DIAGNOSIS — Z9181 History of falling: Secondary | ICD-10-CM | POA: Diagnosis not present

## 2022-09-09 DIAGNOSIS — I480 Paroxysmal atrial fibrillation: Secondary | ICD-10-CM | POA: Diagnosis not present

## 2022-09-09 DIAGNOSIS — I2510 Atherosclerotic heart disease of native coronary artery without angina pectoris: Secondary | ICD-10-CM | POA: Diagnosis not present

## 2022-09-09 DIAGNOSIS — M1990 Unspecified osteoarthritis, unspecified site: Secondary | ICD-10-CM | POA: Diagnosis not present

## 2022-09-09 DIAGNOSIS — Z7901 Long term (current) use of anticoagulants: Secondary | ICD-10-CM | POA: Diagnosis not present

## 2022-09-09 DIAGNOSIS — Z951 Presence of aortocoronary bypass graft: Secondary | ICD-10-CM | POA: Diagnosis not present

## 2022-09-09 DIAGNOSIS — I509 Heart failure, unspecified: Secondary | ICD-10-CM | POA: Diagnosis not present

## 2022-09-09 DIAGNOSIS — K219 Gastro-esophageal reflux disease without esophagitis: Secondary | ICD-10-CM | POA: Diagnosis not present

## 2022-09-09 DIAGNOSIS — M797 Fibromyalgia: Secondary | ICD-10-CM | POA: Diagnosis not present

## 2022-09-09 DIAGNOSIS — Z952 Presence of prosthetic heart valve: Secondary | ICD-10-CM | POA: Diagnosis not present

## 2022-09-09 DIAGNOSIS — Z950 Presence of cardiac pacemaker: Secondary | ICD-10-CM | POA: Diagnosis not present

## 2022-09-12 ENCOUNTER — Telehealth: Payer: Self-pay | Admitting: Family Medicine

## 2022-09-12 DIAGNOSIS — I509 Heart failure, unspecified: Secondary | ICD-10-CM | POA: Diagnosis not present

## 2022-09-12 DIAGNOSIS — J4489 Other specified chronic obstructive pulmonary disease: Secondary | ICD-10-CM | POA: Diagnosis not present

## 2022-09-12 DIAGNOSIS — I495 Sick sinus syndrome: Secondary | ICD-10-CM | POA: Diagnosis not present

## 2022-09-12 DIAGNOSIS — I2510 Atherosclerotic heart disease of native coronary artery without angina pectoris: Secondary | ICD-10-CM | POA: Diagnosis not present

## 2022-09-12 DIAGNOSIS — I110 Hypertensive heart disease with heart failure: Secondary | ICD-10-CM | POA: Diagnosis not present

## 2022-09-12 DIAGNOSIS — I480 Paroxysmal atrial fibrillation: Secondary | ICD-10-CM | POA: Diagnosis not present

## 2022-09-12 NOTE — Telephone Encounter (Signed)
Jim from [facility] called and is requesting verbal orders  PT 2 times a week for 2 weeks 1 times a week for 6 weeks  Would also like speech therapy    Also has concerns  Would like orders for a new transport wheelchar  Says pt is getting up every 30-90 min to go pee at night and is not sleeping and states he is concerned about that  Also says she is missing a RX ATARAX  HE CAN BE REACHED at [phone]

## 2022-09-13 ENCOUNTER — Telehealth: Payer: Self-pay | Admitting: Family Medicine

## 2022-09-13 NOTE — Telephone Encounter (Signed)
Tried returning call to [facility] Physical Therapist. Left detailed message on his secure voice message system.

## 2022-09-13 NOTE — Telephone Encounter (Signed)
Threasa Alpha PT for [facility] home health called back to clarify his initial requests.  He says that the request for speech therapy was not for communication. Instead, its actually for swallowing. Patient reports an increase in chocking, so the focus of speech therapy is swallowing.   Also, the family says that patient wakes up every 30-90 minutes throughout the night. Patient already has a bedside commode, so she doesn't need another one. Physical therapist Rosanne Ashing wants to know if that is normal for patient to wake up that frequently? Patient is not getting much sleep at night, which causes her to start her day much later.

## 2022-09-13 NOTE — Telephone Encounter (Signed)
April from [facility] called and needs verbal orders to start care this Friday for skill nursing, once a week for 3 weeks.  Also she needs a spacer for her inhaler sent in.  Provided call back number [phone].

## 2022-09-14 ENCOUNTER — Telehealth: Payer: Self-pay | Admitting: Family Medicine

## 2022-09-14 MED ORDER — SPACER/AERO-HOLDING CHAMBERS DEVI: 5 refills | Status: DC

## 2022-09-14 NOTE — Telephone Encounter (Signed)
P.A. FLUTICASONE HFA (FLOVENT) t# [phone] [facility]

## 2022-09-14 NOTE — Telephone Encounter (Signed)
Verbal order given  

## 2022-09-15 DIAGNOSIS — I495 Sick sinus syndrome: Secondary | ICD-10-CM | POA: Diagnosis not present

## 2022-09-15 DIAGNOSIS — I509 Heart failure, unspecified: Secondary | ICD-10-CM | POA: Diagnosis not present

## 2022-09-15 DIAGNOSIS — I480 Paroxysmal atrial fibrillation: Secondary | ICD-10-CM | POA: Diagnosis not present

## 2022-09-15 DIAGNOSIS — J4489 Other specified chronic obstructive pulmonary disease: Secondary | ICD-10-CM | POA: Diagnosis not present

## 2022-09-15 DIAGNOSIS — I2510 Atherosclerotic heart disease of native coronary artery without angina pectoris: Secondary | ICD-10-CM | POA: Diagnosis not present

## 2022-09-15 DIAGNOSIS — I110 Hypertensive heart disease with heart failure: Secondary | ICD-10-CM | POA: Diagnosis not present

## 2022-09-16 DIAGNOSIS — I509 Heart failure, unspecified: Secondary | ICD-10-CM | POA: Diagnosis not present

## 2022-09-16 DIAGNOSIS — I110 Hypertensive heart disease with heart failure: Secondary | ICD-10-CM | POA: Diagnosis not present

## 2022-09-16 DIAGNOSIS — I495 Sick sinus syndrome: Secondary | ICD-10-CM | POA: Diagnosis not present

## 2022-09-16 DIAGNOSIS — J4489 Other specified chronic obstructive pulmonary disease: Secondary | ICD-10-CM | POA: Diagnosis not present

## 2022-09-16 DIAGNOSIS — I480 Paroxysmal atrial fibrillation: Secondary | ICD-10-CM | POA: Diagnosis not present

## 2022-09-16 DIAGNOSIS — I2510 Atherosclerotic heart disease of native coronary artery without angina pectoris: Secondary | ICD-10-CM | POA: Diagnosis not present

## 2022-09-19 DIAGNOSIS — I509 Heart failure, unspecified: Secondary | ICD-10-CM | POA: Diagnosis not present

## 2022-09-19 DIAGNOSIS — I480 Paroxysmal atrial fibrillation: Secondary | ICD-10-CM | POA: Diagnosis not present

## 2022-09-19 DIAGNOSIS — J4489 Other specified chronic obstructive pulmonary disease: Secondary | ICD-10-CM | POA: Diagnosis not present

## 2022-09-19 DIAGNOSIS — I495 Sick sinus syndrome: Secondary | ICD-10-CM | POA: Diagnosis not present

## 2022-09-19 DIAGNOSIS — I2510 Atherosclerotic heart disease of native coronary artery without angina pectoris: Secondary | ICD-10-CM | POA: Diagnosis not present

## 2022-09-19 DIAGNOSIS — I110 Hypertensive heart disease with heart failure: Secondary | ICD-10-CM | POA: Diagnosis not present

## 2022-09-19 NOTE — Telephone Encounter (Signed)
Called [facility] t# [phone] we do not have pts [employer] prescription card so I don't have her ID# & they will not release it to me but was able use Medicare ID# So after 3 phone calls, Flovent Brand is covered they got paid claim, I called pharmacy & Flovent Brand is no longer available, it is discontinued, no longer even made. Pt has tried Arnuity & Budesonide, both powder base which strangles pt.    I called [facility] back & had them do another P.A. for the generic and was approved for year,  called daughter & informed.  Called [facility] & went thru they will order for tomorrow

## 2022-09-20 ENCOUNTER — Institutional Professional Consult (permissible substitution): Payer: Medicare Other | Admitting: Pulmonary Disease

## 2022-09-20 DIAGNOSIS — I480 Paroxysmal atrial fibrillation: Secondary | ICD-10-CM | POA: Diagnosis not present

## 2022-09-20 DIAGNOSIS — I509 Heart failure, unspecified: Secondary | ICD-10-CM | POA: Diagnosis not present

## 2022-09-20 DIAGNOSIS — J4489 Other specified chronic obstructive pulmonary disease: Secondary | ICD-10-CM | POA: Diagnosis not present

## 2022-09-20 DIAGNOSIS — I495 Sick sinus syndrome: Secondary | ICD-10-CM | POA: Diagnosis not present

## 2022-09-20 DIAGNOSIS — I2510 Atherosclerotic heart disease of native coronary artery without angina pectoris: Secondary | ICD-10-CM | POA: Diagnosis not present

## 2022-09-20 DIAGNOSIS — I110 Hypertensive heart disease with heart failure: Secondary | ICD-10-CM | POA: Diagnosis not present

## 2022-09-21 NOTE — Telephone Encounter (Signed)
See telephone call    P.A. for the generic Flovent was approved for year,  called daughter & informed.  Called [facility] & went thru they will order for tomorrow

## 2022-09-22 ENCOUNTER — Telehealth: Payer: Self-pay | Admitting: Family Medicine

## 2022-09-22 DIAGNOSIS — I110 Hypertensive heart disease with heart failure: Secondary | ICD-10-CM | POA: Diagnosis not present

## 2022-09-22 DIAGNOSIS — J4489 Other specified chronic obstructive pulmonary disease: Secondary | ICD-10-CM | POA: Diagnosis not present

## 2022-09-22 DIAGNOSIS — I495 Sick sinus syndrome: Secondary | ICD-10-CM | POA: Diagnosis not present

## 2022-09-22 DIAGNOSIS — I480 Paroxysmal atrial fibrillation: Secondary | ICD-10-CM | POA: Diagnosis not present

## 2022-09-22 DIAGNOSIS — I509 Heart failure, unspecified: Secondary | ICD-10-CM | POA: Diagnosis not present

## 2022-09-22 DIAGNOSIS — I2510 Atherosclerotic heart disease of native coronary artery without angina pectoris: Secondary | ICD-10-CM | POA: Diagnosis not present

## 2022-09-22 NOTE — Telephone Encounter (Signed)
[name] called and states that her mother keeps getting strangled on anything and sometimes nothing. When [facility] PT was out today he suggested Speech Therapy could help with that issue. Her mother would like orders with [facility] for a speech therapist to see if they could help. Pt can be reached at [phone].

## 2022-09-22 NOTE — Telephone Encounter (Signed)
I called Rosanne Ashing from [facility] health at [phone] and gave verbal ok to order speech therapy due to difficulty swallowing.

## 2022-09-29 DIAGNOSIS — I495 Sick sinus syndrome: Secondary | ICD-10-CM | POA: Diagnosis not present

## 2022-09-29 DIAGNOSIS — I509 Heart failure, unspecified: Secondary | ICD-10-CM | POA: Diagnosis not present

## 2022-09-29 DIAGNOSIS — J4489 Other specified chronic obstructive pulmonary disease: Secondary | ICD-10-CM | POA: Diagnosis not present

## 2022-09-29 DIAGNOSIS — I110 Hypertensive heart disease with heart failure: Secondary | ICD-10-CM | POA: Diagnosis not present

## 2022-09-29 DIAGNOSIS — I2510 Atherosclerotic heart disease of native coronary artery without angina pectoris: Secondary | ICD-10-CM | POA: Diagnosis not present

## 2022-09-29 DIAGNOSIS — I480 Paroxysmal atrial fibrillation: Secondary | ICD-10-CM | POA: Diagnosis not present

## 2022-10-03 DIAGNOSIS — I110 Hypertensive heart disease with heart failure: Secondary | ICD-10-CM | POA: Diagnosis not present

## 2022-10-03 DIAGNOSIS — I480 Paroxysmal atrial fibrillation: Secondary | ICD-10-CM | POA: Diagnosis not present

## 2022-10-03 DIAGNOSIS — J4489 Other specified chronic obstructive pulmonary disease: Secondary | ICD-10-CM | POA: Diagnosis not present

## 2022-10-03 DIAGNOSIS — I2510 Atherosclerotic heart disease of native coronary artery without angina pectoris: Secondary | ICD-10-CM | POA: Diagnosis not present

## 2022-10-03 DIAGNOSIS — I495 Sick sinus syndrome: Secondary | ICD-10-CM | POA: Diagnosis not present

## 2022-10-03 DIAGNOSIS — I509 Heart failure, unspecified: Secondary | ICD-10-CM | POA: Diagnosis not present

## 2022-10-06 DIAGNOSIS — J4489 Other specified chronic obstructive pulmonary disease: Secondary | ICD-10-CM | POA: Diagnosis not present

## 2022-10-06 DIAGNOSIS — I2510 Atherosclerotic heart disease of native coronary artery without angina pectoris: Secondary | ICD-10-CM | POA: Diagnosis not present

## 2022-10-06 DIAGNOSIS — I110 Hypertensive heart disease with heart failure: Secondary | ICD-10-CM | POA: Diagnosis not present

## 2022-10-06 DIAGNOSIS — I480 Paroxysmal atrial fibrillation: Secondary | ICD-10-CM | POA: Diagnosis not present

## 2022-10-06 DIAGNOSIS — I509 Heart failure, unspecified: Secondary | ICD-10-CM | POA: Diagnosis not present

## 2022-10-06 DIAGNOSIS — I495 Sick sinus syndrome: Secondary | ICD-10-CM | POA: Diagnosis not present

## 2022-10-09 DIAGNOSIS — Z951 Presence of aortocoronary bypass graft: Secondary | ICD-10-CM | POA: Diagnosis not present

## 2022-10-09 DIAGNOSIS — I495 Sick sinus syndrome: Secondary | ICD-10-CM | POA: Diagnosis not present

## 2022-10-09 DIAGNOSIS — E785 Hyperlipidemia, unspecified: Secondary | ICD-10-CM | POA: Diagnosis not present

## 2022-10-09 DIAGNOSIS — Z9181 History of falling: Secondary | ICD-10-CM | POA: Diagnosis not present

## 2022-10-09 DIAGNOSIS — Z950 Presence of cardiac pacemaker: Secondary | ICD-10-CM | POA: Diagnosis not present

## 2022-10-09 DIAGNOSIS — M1990 Unspecified osteoarthritis, unspecified site: Secondary | ICD-10-CM | POA: Diagnosis not present

## 2022-10-09 DIAGNOSIS — I509 Heart failure, unspecified: Secondary | ICD-10-CM | POA: Diagnosis not present

## 2022-10-09 DIAGNOSIS — I110 Hypertensive heart disease with heart failure: Secondary | ICD-10-CM | POA: Diagnosis not present

## 2022-10-09 DIAGNOSIS — K219 Gastro-esophageal reflux disease without esophagitis: Secondary | ICD-10-CM | POA: Diagnosis not present

## 2022-10-09 DIAGNOSIS — Z7951 Long term (current) use of inhaled steroids: Secondary | ICD-10-CM | POA: Diagnosis not present

## 2022-10-09 DIAGNOSIS — Z7901 Long term (current) use of anticoagulants: Secondary | ICD-10-CM | POA: Diagnosis not present

## 2022-10-09 DIAGNOSIS — M797 Fibromyalgia: Secondary | ICD-10-CM | POA: Diagnosis not present

## 2022-10-09 DIAGNOSIS — I2510 Atherosclerotic heart disease of native coronary artery without angina pectoris: Secondary | ICD-10-CM | POA: Diagnosis not present

## 2022-10-09 DIAGNOSIS — I480 Paroxysmal atrial fibrillation: Secondary | ICD-10-CM | POA: Diagnosis not present

## 2022-10-09 DIAGNOSIS — J4489 Other specified chronic obstructive pulmonary disease: Secondary | ICD-10-CM | POA: Diagnosis not present

## 2022-10-09 DIAGNOSIS — Z952 Presence of prosthetic heart valve: Secondary | ICD-10-CM | POA: Diagnosis not present

## 2022-10-12 ENCOUNTER — Emergency Department (HOSPITAL_COMMUNITY): Payer: Medicare Other

## 2022-10-12 ENCOUNTER — Other Ambulatory Visit: Payer: Self-pay

## 2022-10-12 ENCOUNTER — Encounter (HOSPITAL_COMMUNITY): Payer: Self-pay

## 2022-10-12 ENCOUNTER — Emergency Department (HOSPITAL_COMMUNITY)
Admission: EM | Admit: 2022-10-12 | Discharge: 2022-10-13 | Disposition: A | Discharge status: Home / Self Care | Payer: Medicare Other | Attending: Emergency Medicine | Admitting: Emergency Medicine

## 2022-10-12 DIAGNOSIS — R0602 Shortness of breath: Secondary | ICD-10-CM | POA: Diagnosis not present

## 2022-10-12 DIAGNOSIS — J441 Chronic obstructive pulmonary disease with (acute) exacerbation: Secondary | ICD-10-CM | POA: Insufficient documentation

## 2022-10-12 DIAGNOSIS — I10 Essential (primary) hypertension: Secondary | ICD-10-CM | POA: Diagnosis not present

## 2022-10-12 DIAGNOSIS — I959 Hypotension, unspecified: Secondary | ICD-10-CM | POA: Diagnosis not present

## 2022-10-12 DIAGNOSIS — R0600 Dyspnea, unspecified: Secondary | ICD-10-CM | POA: Diagnosis not present

## 2022-10-12 DIAGNOSIS — Z7901 Long term (current) use of anticoagulants: Secondary | ICD-10-CM | POA: Insufficient documentation

## 2022-10-12 DIAGNOSIS — J90 Pleural effusion, not elsewhere classified: Secondary | ICD-10-CM | POA: Diagnosis not present

## 2022-10-12 LAB — COMPREHENSIVE METABOLIC PANEL
ALT: 15 U/L (ref 0–44)
AST: 20 U/L (ref 15–41)
Albumin: 3.3 g/dL — ABNORMAL LOW (ref 3.5–5.0)
Alkaline Phosphatase: 48 U/L (ref 38–126)
Anion gap: 11 (ref 5–15)
BUN: 5 mg/dL — ABNORMAL LOW (ref 8–23)
CO2: 27 mmol/L (ref 22–32)
Calcium: 8.7 mg/dL — ABNORMAL LOW (ref 8.9–10.3)
Chloride: 96 mmol/L — ABNORMAL LOW (ref 98–111)
Creatinine, Ser: 0.52 mg/dL (ref 0.44–1.00)
GFR, Estimated: >60 mL/min (ref >60)
Glucose, Bld: 104 mg/dL — ABNORMAL HIGH (ref 70–99)
Potassium: 3.6 mmol/L (ref 3.5–5.1)
Sodium: 134 mmol/L — ABNORMAL LOW (ref 135–145)
Total Bilirubin: 0.8 mg/dL (ref 0.3–1.2)
Total Protein: 6.2 g/dL — ABNORMAL LOW (ref 6.5–8.1)

## 2022-10-12 LAB — TROPONIN I (HIGH SENSITIVITY)
Troponin I (High Sensitivity): 18 ng/L — ABNORMAL HIGH (ref <18)
Troponin I (High Sensitivity): 22 ng/L — ABNORMAL HIGH (ref <18)

## 2022-10-12 LAB — CBC
HCT: 35.8 % — ABNORMAL LOW (ref 36.0–46.0)
Hemoglobin: 11.3 g/dL — ABNORMAL LOW (ref 12.0–15.0)
MCH: 29.6 pg (ref 26.0–34.0)
MCHC: 31.6 g/dL (ref 30.0–36.0)
MCV: 93.7 fL (ref 80.0–100.0)
Platelets: 244 10*3/uL (ref 150–400)
RBC: 3.82 MIL/uL — ABNORMAL LOW (ref 3.87–5.11)
RDW: 13.8 % (ref 11.5–15.5)
WBC: 6.7 10*3/uL (ref 4.0–10.5)
nRBC: 0 % (ref 0.0–0.2)

## 2022-10-12 LAB — BRAIN NATRIURETIC PEPTIDE: B Natriuretic Peptide: 134.6 pg/mL — ABNORMAL HIGH (ref 0.0–100.0)

## 2022-10-12 MED ORDER — DOXYCYCLINE HYCLATE 100 MG PO TABS
100.0000 mg | ORAL_TABLET | Freq: Once | ORAL | Status: AC
  Administered 2022-10-12: 100 mg via ORAL
  Filled 2022-10-12: qty 1

## 2022-10-12 MED ORDER — AEROCHAMBER PLUS FLO-VU LARGE MISC
1.0000 | Freq: Once | Status: AC
  Administered 2022-10-12: 1

## 2022-10-12 MED ORDER — DOXYCYCLINE HYCLATE 100 MG PO CAPS: 100.0000 mg | ORAL_CAPSULE | Freq: Two times a day (BID) | ORAL | 0 refills | Status: DC

## 2022-10-12 MED ORDER — IOHEXOL 350 MG/ML SOLN
75.0000 mL | Freq: Once | INTRAVENOUS | Status: AC | PRN
  Administered 2022-10-12: 75 mL via INTRAVENOUS

## 2022-10-12 MED ORDER — ALBUTEROL SULFATE HFA 108 (90 BASE) MCG/ACT IN AERS
2.0000 | INHALATION_SPRAY | RESPIRATORY_TRACT | Status: DC | PRN
  Filled 2022-10-12: qty 6.7

## 2022-10-12 MED ORDER — METHYLPREDNISOLONE SODIUM SUCC 125 MG IJ SOLR
125.0000 mg | Freq: Once | INTRAMUSCULAR | Status: AC
  Administered 2022-10-12: 125 mg via INTRAVENOUS
  Filled 2022-10-12: qty 2

## 2022-10-12 MED ORDER — IPRATROPIUM-ALBUTEROL 0.5-2.5 (3) MG/3ML IN SOLN
3.0000 mL | Freq: Once | RESPIRATORY_TRACT | Status: AC
  Administered 2022-10-12: 3 mL via RESPIRATORY_TRACT
  Filled 2022-10-12: qty 3

## 2022-10-12 MED ORDER — PREDNISONE 20 MG PO TABS: ORAL_TABLET | ORAL | 0 refills | Status: DC

## 2022-10-12 NOTE — ED Triage Notes (Signed)
Shortness of breath for a few weeks to months. No cough, fever/chills. Able to finish sentences with no distress. 97% on RA.

## 2022-10-12 NOTE — Discharge Instructions (Signed)
1.  Start your prednisone tomorrow as prescribed.  You were given a dose of steroids in the emergency department.  Also, start your doxycycline tomorrow morning.  You are given a first dose in the emergency department this evening. 2.  Use the inhaler with spacer as instructed in the emergency department every 4 hours as needed for coughing or wheezing. 3.  Follow-up with your doctor to discuss a home nebulizer machine to assist in administration of your COPD medications. 4.  Return to the emergency department if you develop a fever, chest pain, increasing difficulty breathing or other concerning changes.

## 2022-10-12 NOTE — ED Provider Notes (Signed)
[place] EMERGENCY DEPARTMENT AT [facility] Provider Note   CSN: [id number] Arrival date & time: 10/12/22  1711     History  Chief Complaint  Patient presents with   Shortness of Breath    [name] is a 89 y.o. female.  HPI Patient reports that she gets episodes of shortness of breath.  She reports that she sometimes feels like she just cannot get her breath and starts breathing pretty quickly.  Patient's daughter is here at bedside as well.  They have had increasing frequency of the symptoms over the past few days.  Patient's daughter reports that they were up all night last night with coughing.  She has been coughing up some mucus.  Patient denies she has chest pain with this.  She reports sometimes it feels like stuff gets stuck in her throat and she cannot cough it out.  Patient does have a home albuterol inhaler.  She does not have a spacer or nebulizer machine.    Home Medications Prior to Admission medications   Medication Sig Start Date End Date Taking? Authorizing Provider  doxycycline (VIBRAMYCIN) 100 MG capsule Take 1 capsule (100 mg total) by mouth 2 (two) times daily. 10/12/22  Yes Arby Barrette, MD  predniSONE (DELTASONE) 20 MG tablet 2 tabs po daily x 3 days 10/12/22  Yes Arby Barrette, MD  acetaminophen (TYLENOL) 650 MG CR tablet Take 650-1,300 mg by mouth 2 (two) times daily as needed for pain.    [provider]  amiodarone (PACERONE) 200 MG tablet Take 1 tablet (200 mg total) by mouth daily. 02/23/22   Camnitz, Andree Coss, MD  apixaban (ELIQUIS) 2.5 MG TABS tablet Take 1 tablet (2.5 mg total) by mouth 2 (two) times daily. 05/20/22   Kathleene Hazel, MD  Carboxymethylcellulose Sodium (EYE DROPS OP) Apply 2 drops to eye daily as needed (gritty eyes).    [provider]  Cholecalciferol (VITAMIN D-3) 25 MCG (1000 UT) CAPS Take 1,000 Units by mouth daily.    [provider]  docusate sodium (COLACE) 100 MG capsule  Take 100 mg by mouth daily.    [provider]  Ensure (ENSURE) Take 237 mLs by mouth every other day.    [provider]  fluticasone (FLOVENT HFA) 220 MCG/ACT inhaler Inhale into the lungs 2 (two) times daily.    [provider]  furosemide (LASIX) 20 MG tablet Take 0.5 tablets (10 mg total) by mouth daily as needed for fluid or edema (for weight gain >5 lbs). 09/07/22   Kathlen Mody, MD  hydrOXYzine (ATARAX) 10 MG tablet Take 1 tablet (10 mg total) by mouth 3 (three) times daily as needed for anxiety. 09/07/22   Kathlen Mody, MD  LINZESS 72 MCG capsule Take 1 capsule (72 mcg total) by mouth daily. 02/10/21   Medina-Vargas, Monina C, NP  loratadine (CLARITIN) 10 MG tablet Take 1 tablet (10 mg total) by mouth daily. 02/10/21   Medina-Vargas, Monina C, NP  nitroGLYCERIN (NITROSTAT) 0.4 MG SL tablet DISSOLVE 1 TABLET UNDER THE TONGUE EVERY 5 MINUTES AS NEEDED FOR CHEST PAIN 11/01/21   Kathleene Hazel, MD  pantoprazole (PROTONIX) 40 MG tablet Take 1 tablet (40 mg total) by mouth 2 (two) times daily. 01/26/22   Ronnald Nian, MD  rosuvastatin (CRESTOR) 20 MG tablet TAKE 1 TABLET(20 MG) BY MOUTH DAILY Patient taking differently: Take 20 mg by mouth daily. 07/25/22   Ronnald Nian, MD  Spacer/Aero-Holding [facility] Use as  directed 09/14/22   Ronnald Nian, MD  VENTOLIN HFA 108 (90 Base) MCG/ACT inhaler INHALE 2 PUFFS INTO THE LUNGS EVERY 6 HOURS AS NEEDED FOR WHEEZING OR SHORTNESS OF BREATH Patient taking differently: Inhale 2 puffs into the lungs every 6 (six) hours as needed for wheezing or shortness of breath. 07/15/21   Ronnald Nian, MD  Wheat Dextrin (BENEFIBER PO) Take 1 Scoop by mouth daily as needed (fiber).    [provider]      Allergies    Other, Bactrim [sulfamethoxazole-trimethoprim], Amitriptyline hcl, Aspirin, Tape, and Zetia [ezetimibe]    Review of Systems   Review of Systems  Physical Exam Updated Vital Signs BP (!) 160/62    Pulse 72   Temp 97.7 F (36.5 C) (Oral)   Resp 19   Ht 5\' 2"  (1.575 m)   Wt 40.9 kg   SpO2 98%   BMI 16.49 kg/m  Physical Exam Constitutional:      Comments: Patient is alert.  No acute distress.  Very thin but clinically well in appearance.  HENT:     Mouth/Throat:     Pharynx: Oropharynx is clear.  Eyes:     Extraocular Movements: Extraocular movements intact.  Cardiovascular:     Rate and Rhythm: Normal rate.  Pulmonary:     Effort: Pulmonary effort is normal.     Breath sounds: Normal breath sounds.  Abdominal:     General: There is no distension.     Palpations: Abdomen is soft.     Tenderness: There is no abdominal tenderness.  Musculoskeletal:        General: No swelling or tenderness. Normal range of motion.     Right lower leg: No edema.     Left lower leg: No edema.     Comments: Legs are very thin.  Calves are nontender.  No appreciable pitting edema.  Patient's daughter perceives some swelling but objectively I do not appreciate swelling and there is no pitting.  Skin:    General: Skin is warm and dry.  Neurological:     General: No focal deficit present.     Comments: Patient is alert.  She is situationally appropriate and interactive.  Movements are coordinated purposeful symmetric.  Psychiatric:     Comments: At times mildly anxious but situationally appropriate.     ED Results / Procedures / Treatments   Labs (all labs ordered are listed, but only abnormal results are displayed) Labs Reviewed  COMPREHENSIVE METABOLIC PANEL - Abnormal; Notable for the following components:      Result Value   Sodium 134 (*)    Chloride 96 (*)    Glucose, Bld 104 (*)    BUN 5 (*)    Calcium 8.7 (*)    Total Protein 6.2 (*)    Albumin 3.3 (*)    All other components within normal limits  CBC - Abnormal; Notable for the following components:   RBC 3.82 (*)    Hemoglobin 11.3 (*)    HCT 35.8 (*)    All other components within normal limits  BRAIN NATRIURETIC  PEPTIDE - Abnormal; Notable for the following components:   B Natriuretic Peptide 134.6 (*)    All other components within normal limits  TROPONIN I (HIGH SENSITIVITY) - Abnormal; Notable for the following components:   Troponin I (High Sensitivity) 18 (*)    All other components within normal limits  TROPONIN I (HIGH SENSITIVITY) - Abnormal; Notable for the following components:  Troponin I (High Sensitivity) 22 (*)    All other components within normal limits    EKG None EKG not uploaded to MUSE. Ventricular paced at 64 Radiology CT Angio Chest PE W and/or Wo Contrast  Result Date: 10/12/2022 CLINICAL DATA:  Pulmonary embolism (PE) suspected, high prob, dyspnea EXAM: CT ANGIOGRAPHY CHEST WITH CONTRAST TECHNIQUE: Multidetector CT imaging of the chest was performed using the standard protocol during bolus administration of intravenous contrast. Multiplanar CT image reconstructions and MIPs were obtained to evaluate the vascular anatomy. RADIATION DOSE REDUCTION: This exam was performed according to the departmental dose-optimization program which includes automated exposure control, adjustment of the mA and/or kV according to patient size and/or use of iterative reconstruction technique. CONTRAST:  75mL OMNIPAQUE IOHEXOL 350 MG/ML SOLN COMPARISON:  01/16/2021 FINDINGS: Cardiovascular: There is adequate opacification of the pulmonary arterial tree. No intraluminal filling defect identified to suggest acute pulmonary embolism. Central pulmonary arteries are of normal caliber. Coronary artery bypass grafting and transcatheter aortic valve replacement has been performed. Global cardiac size within normal limits. Left subclavian dual lead pacemaker is seen in place with leads within the right atrium and right ventricle. Extensive atherosclerotic calcifications seen within the thoracic aorta. No aortic aneurysm. Mediastinum/Nodes: 11 mm nodule within the right thyroid lobe is stable since prior  examination, not well characterized on this examination. No pathologic thoracic adenopathy. Esophagus unremarkable. Lungs/Pleura: Biapical pleuroparenchymal scarring. Mean 6 mm pulmonary nodule within the left upper lobe, axial image # 27/7 is stable since prior examination and safely considered benign. Mosaic attenuation of the pulmonary parenchyma is present in keeping with multifocal air trapping likely related to small airways disease. No pneumothorax or pleural effusion. No central obstructing lesion. Upper Abdomen: No acute abnormality. Musculoskeletal: Severe thoracolumbar levoscoliosis. No acute bone abnormality. No lytic or blastic bone lesion. Review of the MIP images confirms the above findings. IMPRESSION: 1. No pulmonary embolism. 2. Mosaic attenuation of the pulmonary parenchyma in keeping with multifocal air trapping likely related to small airways disease. 3. Status post coronary artery bypass grafting and transcatheter aortic valve replacement. 4. Stable 11 mm nodule within the right thyroid lobe, not well characterized on this examination. Not clinically significant; no follow-up imaging recommended (ref: J Am Coll Radiol. 2015 Feb;12(2): 143-50). Aortic Atherosclerosis (ICD10-I70.0). Electronically Signed   By: Helyn Numbers M.D.   On: 10/12/2022 19:55   DG Chest Portable 1 View  Result Date: 10/12/2022 CLINICAL DATA:  Shortness of breath EXAM: PORTABLE CHEST 1 VIEW COMPARISON:  09/06/2022 FINDINGS: Post sternotomy changes and valve prosthesis. Stable left-sided pacing device. Small bilateral effusions. No focal airspace disease. Stable cardiomediastinal silhouette with aortic atherosclerosis. No pneumothorax IMPRESSION: Small bilateral pleural effusions. No focal airspace disease. Electronically Signed   By: Jasmine Pang M.D.   On: 10/12/2022 18:08    Procedures Procedures    Medications Ordered in ED Medications  albuterol (VENTOLIN HFA) 108 (90 Base) MCG/ACT inhaler 2 puff (has no  administration in time range)  doxycycline (VIBRA-TABS) tablet 100 mg (has no administration in time range)  ipratropium-albuterol (DUONEB) 0.5-2.5 (3) MG/3ML nebulizer solution 3 mL (3 mLs Nebulization Given 10/12/22 1849)  iohexol (OMNIPAQUE) 350 MG/ML injection 75 mL (75 mLs Intravenous Contrast Given 10/12/22 1942)  methylPREDNISolone sodium succinate (SOLU-MEDROL) 125 mg/2 mL injection 125 mg (125 mg Intravenous Given 10/12/22 2131)  AeroChamber Plus Flo-Vu Large MISC 1 each (1 each Other Given 10/12/22 2207)    ED Course/ Medical Decision Making/ A&P  Medical Decision Making Amount and/or Complexity of Data Reviewed Labs: ordered. Radiology: ordered.  Risk Prescription drug management.   Patient presents as outlined.  She has had problems with recurrent shortness of breath.  Patient does have history of COPD.  Clinically she is nontoxic and well in appearance with clear mental status.  Will proceed with EKG, chest x-ray, ACS workup.  EKG is paced and consistent with prior tracing 4\15\2020.  Portable chest x-ray reviewed by radiology and visually reviewed by myself shows hyperexpansion of lung fields.  Radiology reports small pleural effusions.  Not significant in appearance at this time.  CT angio reviewed by radiology no PE, air trapping consistent with COPD.  No pleural effusion is observed on CT scan.  Patient got subjective improvement with DuoNeb therapy.  Solu-Medrol 125 administered.  Doxycycline oral dose 100 mg administered.  At this time, with patient otherwise well in appearance, no hypoxia or respiratory distress, PE and MI ruled out, appears stable for continued home management.  Patient has been using an inhaler without spacer.  At this time she has had spacer teaching to help with delivery of albuterol.  I have recommended close follow-up with PCP and discussed possible advantages of a nebulizer machine at home.        Final Clinical  Impression(s) / ED Diagnoses Final diagnoses:  COPD exacerbation (HCC)    Rx / DC Orders ED Discharge Orders          Ordered    predniSONE (DELTASONE) 20 MG tablet        10/12/22 2209    doxycycline (VIBRAMYCIN) 100 MG capsule  2 times daily        10/12/22 2209              Arby Barrette, MD 10/12/22 2224

## 2022-10-12 NOTE — ED Notes (Signed)
Patient transported to CT 

## 2022-10-13 DIAGNOSIS — I495 Sick sinus syndrome: Secondary | ICD-10-CM | POA: Diagnosis not present

## 2022-10-13 DIAGNOSIS — I2510 Atherosclerotic heart disease of native coronary artery without angina pectoris: Secondary | ICD-10-CM | POA: Diagnosis not present

## 2022-10-13 DIAGNOSIS — J4489 Other specified chronic obstructive pulmonary disease: Secondary | ICD-10-CM | POA: Diagnosis not present

## 2022-10-13 DIAGNOSIS — I509 Heart failure, unspecified: Secondary | ICD-10-CM | POA: Diagnosis not present

## 2022-10-13 DIAGNOSIS — I110 Hypertensive heart disease with heart failure: Secondary | ICD-10-CM | POA: Diagnosis not present

## 2022-10-13 DIAGNOSIS — I480 Paroxysmal atrial fibrillation: Secondary | ICD-10-CM | POA: Diagnosis not present

## 2022-10-13 DIAGNOSIS — R531 Weakness: Secondary | ICD-10-CM | POA: Diagnosis not present

## 2022-10-13 DIAGNOSIS — Z7401 Bed confinement status: Secondary | ICD-10-CM | POA: Diagnosis not present

## 2022-10-14 DIAGNOSIS — J4489 Other specified chronic obstructive pulmonary disease: Secondary | ICD-10-CM | POA: Diagnosis not present

## 2022-10-14 DIAGNOSIS — I2510 Atherosclerotic heart disease of native coronary artery without angina pectoris: Secondary | ICD-10-CM | POA: Diagnosis not present

## 2022-10-14 DIAGNOSIS — I110 Hypertensive heart disease with heart failure: Secondary | ICD-10-CM | POA: Diagnosis not present

## 2022-10-14 DIAGNOSIS — I495 Sick sinus syndrome: Secondary | ICD-10-CM | POA: Diagnosis not present

## 2022-10-14 DIAGNOSIS — I480 Paroxysmal atrial fibrillation: Secondary | ICD-10-CM | POA: Diagnosis not present

## 2022-10-14 DIAGNOSIS — I509 Heart failure, unspecified: Secondary | ICD-10-CM | POA: Diagnosis not present

## 2022-10-18 ENCOUNTER — Encounter (HOSPITAL_COMMUNITY): Payer: Self-pay

## 2022-10-18 ENCOUNTER — Inpatient Hospital Stay (HOSPITAL_COMMUNITY)
Admission: EM | Admit: 2022-10-18 | Discharge: 2022-10-21 | DRG: 690 | Disposition: A | Discharge status: Skilled Nursing Facility | Payer: Medicare Other | Attending: Internal Medicine | Admitting: Internal Medicine

## 2022-10-18 ENCOUNTER — Ambulatory Visit: Payer: Medicare Other

## 2022-10-18 ENCOUNTER — Observation Stay (HOSPITAL_COMMUNITY): Payer: Medicare Other

## 2022-10-18 ENCOUNTER — Emergency Department (HOSPITAL_COMMUNITY): Payer: Medicare Other

## 2022-10-18 DIAGNOSIS — I509 Heart failure, unspecified: Secondary | ICD-10-CM | POA: Diagnosis not present

## 2022-10-18 DIAGNOSIS — Z681 Body mass index (BMI) 19 or less, adult: Secondary | ICD-10-CM | POA: Diagnosis not present

## 2022-10-18 DIAGNOSIS — Z87891 Personal history of nicotine dependence: Secondary | ICD-10-CM

## 2022-10-18 DIAGNOSIS — S299XXA Unspecified injury of thorax, initial encounter: Secondary | ICD-10-CM | POA: Diagnosis not present

## 2022-10-18 DIAGNOSIS — I495 Sick sinus syndrome: Secondary | ICD-10-CM | POA: Diagnosis not present

## 2022-10-18 DIAGNOSIS — W010XXA Fall on same level from slipping, tripping and stumbling without subsequent striking against object, initial encounter: Secondary | ICD-10-CM | POA: Diagnosis present

## 2022-10-18 DIAGNOSIS — Z950 Presence of cardiac pacemaker: Secondary | ICD-10-CM

## 2022-10-18 DIAGNOSIS — S59909A Unspecified injury of unspecified elbow, initial encounter: Secondary | ICD-10-CM | POA: Diagnosis not present

## 2022-10-18 DIAGNOSIS — J449 Chronic obstructive pulmonary disease, unspecified: Secondary | ICD-10-CM | POA: Diagnosis present

## 2022-10-18 DIAGNOSIS — S8012XA Contusion of left lower leg, initial encounter: Secondary | ICD-10-CM | POA: Diagnosis present

## 2022-10-18 DIAGNOSIS — Z886 Allergy status to analgesic agent status: Secondary | ICD-10-CM

## 2022-10-18 DIAGNOSIS — I69391 Dysphagia following cerebral infarction: Secondary | ICD-10-CM | POA: Diagnosis not present

## 2022-10-18 DIAGNOSIS — I63511 Cerebral infarction due to unspecified occlusion or stenosis of right middle cerebral artery: Secondary | ICD-10-CM | POA: Diagnosis not present

## 2022-10-18 DIAGNOSIS — R29703 NIHSS score 3: Secondary | ICD-10-CM | POA: Diagnosis present

## 2022-10-18 DIAGNOSIS — Z7901 Long term (current) use of anticoagulants: Secondary | ICD-10-CM

## 2022-10-18 DIAGNOSIS — S0990XA Unspecified injury of head, initial encounter: Secondary | ICD-10-CM | POA: Diagnosis not present

## 2022-10-18 DIAGNOSIS — M25552 Pain in left hip: Secondary | ICD-10-CM | POA: Diagnosis not present

## 2022-10-18 DIAGNOSIS — R9089 Other abnormal findings on diagnostic imaging of central nervous system: Secondary | ICD-10-CM | POA: Diagnosis not present

## 2022-10-18 DIAGNOSIS — R41841 Cognitive communication deficit: Secondary | ICD-10-CM | POA: Diagnosis not present

## 2022-10-18 DIAGNOSIS — Z66 Do not resuscitate: Secondary | ICD-10-CM | POA: Diagnosis present

## 2022-10-18 DIAGNOSIS — Z79899 Other long term (current) drug therapy: Secondary | ICD-10-CM

## 2022-10-18 DIAGNOSIS — N3000 Acute cystitis without hematuria: Secondary | ICD-10-CM

## 2022-10-18 DIAGNOSIS — M797 Fibromyalgia: Secondary | ICD-10-CM | POA: Diagnosis present

## 2022-10-18 DIAGNOSIS — Z955 Presence of coronary angioplasty implant and graft: Secondary | ICD-10-CM

## 2022-10-18 DIAGNOSIS — R2689 Other abnormalities of gait and mobility: Secondary | ICD-10-CM | POA: Diagnosis not present

## 2022-10-18 DIAGNOSIS — R4182 Altered mental status, unspecified: Secondary | ICD-10-CM | POA: Diagnosis not present

## 2022-10-18 DIAGNOSIS — I110 Hypertensive heart disease with heart failure: Secondary | ICD-10-CM | POA: Diagnosis present

## 2022-10-18 DIAGNOSIS — I639 Cerebral infarction, unspecified: Secondary | ICD-10-CM | POA: Diagnosis not present

## 2022-10-18 DIAGNOSIS — Z993 Dependence on wheelchair: Secondary | ICD-10-CM

## 2022-10-18 DIAGNOSIS — I4819 Other persistent atrial fibrillation: Secondary | ICD-10-CM | POA: Diagnosis present

## 2022-10-18 DIAGNOSIS — Z9181 History of falling: Secondary | ICD-10-CM

## 2022-10-18 DIAGNOSIS — M419 Scoliosis, unspecified: Secondary | ICD-10-CM | POA: Diagnosis present

## 2022-10-18 DIAGNOSIS — I081 Rheumatic disorders of both mitral and tricuspid valves: Secondary | ICD-10-CM | POA: Diagnosis present

## 2022-10-18 DIAGNOSIS — Z952 Presence of prosthetic heart valve: Secondary | ICD-10-CM | POA: Diagnosis not present

## 2022-10-18 DIAGNOSIS — E785 Hyperlipidemia, unspecified: Secondary | ICD-10-CM | POA: Diagnosis not present

## 2022-10-18 DIAGNOSIS — Z719 Counseling, unspecified: Secondary | ICD-10-CM | POA: Diagnosis not present

## 2022-10-18 DIAGNOSIS — E876 Hypokalemia: Secondary | ICD-10-CM | POA: Diagnosis not present

## 2022-10-18 DIAGNOSIS — I6523 Occlusion and stenosis of bilateral carotid arteries: Secondary | ICD-10-CM | POA: Diagnosis not present

## 2022-10-18 DIAGNOSIS — G9389 Other specified disorders of brain: Secondary | ICD-10-CM | POA: Diagnosis present

## 2022-10-18 DIAGNOSIS — Z7401 Bed confinement status: Secondary | ICD-10-CM | POA: Diagnosis not present

## 2022-10-18 DIAGNOSIS — Z951 Presence of aortocoronary bypass graft: Secondary | ICD-10-CM

## 2022-10-18 DIAGNOSIS — I10 Essential (primary) hypertension: Secondary | ICD-10-CM | POA: Diagnosis not present

## 2022-10-18 DIAGNOSIS — Y92009 Unspecified place in unspecified non-institutional (private) residence as the place of occurrence of the external cause: Secondary | ICD-10-CM | POA: Diagnosis not present

## 2022-10-18 DIAGNOSIS — I69311 Memory deficit following cerebral infarction: Secondary | ICD-10-CM | POA: Diagnosis not present

## 2022-10-18 DIAGNOSIS — I2510 Atherosclerotic heart disease of native coronary artery without angina pectoris: Secondary | ICD-10-CM | POA: Diagnosis present

## 2022-10-18 DIAGNOSIS — I69828 Other speech and language deficits following other cerebrovascular disease: Secondary | ICD-10-CM | POA: Diagnosis not present

## 2022-10-18 DIAGNOSIS — W19XXXA Unspecified fall, initial encounter: Secondary | ICD-10-CM | POA: Diagnosis not present

## 2022-10-18 DIAGNOSIS — R110 Nausea: Secondary | ICD-10-CM | POA: Diagnosis not present

## 2022-10-18 DIAGNOSIS — B9620 Unspecified Escherichia coli [E. coli] as the cause of diseases classified elsewhere: Secondary | ICD-10-CM | POA: Diagnosis present

## 2022-10-18 DIAGNOSIS — M25512 Pain in left shoulder: Secondary | ICD-10-CM | POA: Diagnosis not present

## 2022-10-18 DIAGNOSIS — L89151 Pressure ulcer of sacral region, stage 1: Secondary | ICD-10-CM | POA: Diagnosis present

## 2022-10-18 DIAGNOSIS — Z741 Need for assistance with personal care: Secondary | ICD-10-CM | POA: Diagnosis not present

## 2022-10-18 DIAGNOSIS — R636 Underweight: Secondary | ICD-10-CM | POA: Diagnosis present

## 2022-10-18 DIAGNOSIS — Z85828 Personal history of other malignant neoplasm of skin: Secondary | ICD-10-CM

## 2022-10-18 DIAGNOSIS — N390 Urinary tract infection, site not specified: Secondary | ICD-10-CM | POA: Diagnosis not present

## 2022-10-18 DIAGNOSIS — Z8673 Personal history of transient ischemic attack (TIA), and cerebral infarction without residual deficits: Secondary | ICD-10-CM

## 2022-10-18 DIAGNOSIS — R8271 Bacteriuria: Secondary | ICD-10-CM | POA: Diagnosis present

## 2022-10-18 DIAGNOSIS — J45909 Unspecified asthma, uncomplicated: Secondary | ICD-10-CM | POA: Diagnosis not present

## 2022-10-18 DIAGNOSIS — I480 Paroxysmal atrial fibrillation: Secondary | ICD-10-CM | POA: Diagnosis not present

## 2022-10-18 DIAGNOSIS — I5032 Chronic diastolic (congestive) heart failure: Secondary | ICD-10-CM | POA: Diagnosis not present

## 2022-10-18 DIAGNOSIS — M79662 Pain in left lower leg: Secondary | ICD-10-CM | POA: Diagnosis not present

## 2022-10-18 DIAGNOSIS — M1990 Unspecified osteoarthritis, unspecified site: Secondary | ICD-10-CM | POA: Diagnosis present

## 2022-10-18 DIAGNOSIS — K219 Gastro-esophageal reflux disease without esophagitis: Secondary | ICD-10-CM | POA: Diagnosis present

## 2022-10-18 DIAGNOSIS — L89156 Pressure-induced deep tissue damage of sacral region: Secondary | ICD-10-CM | POA: Diagnosis not present

## 2022-10-18 DIAGNOSIS — Z8249 Family history of ischemic heart disease and other diseases of the circulatory system: Secondary | ICD-10-CM

## 2022-10-18 DIAGNOSIS — R4781 Slurred speech: Secondary | ICD-10-CM | POA: Diagnosis not present

## 2022-10-18 DIAGNOSIS — J4489 Other specified chronic obstructive pulmonary disease: Secondary | ICD-10-CM | POA: Diagnosis not present

## 2022-10-18 DIAGNOSIS — M6281 Muscle weakness (generalized): Secondary | ICD-10-CM | POA: Diagnosis not present

## 2022-10-18 DIAGNOSIS — G459 Transient cerebral ischemic attack, unspecified: Secondary | ICD-10-CM | POA: Diagnosis not present

## 2022-10-18 DIAGNOSIS — R296 Repeated falls: Secondary | ICD-10-CM | POA: Diagnosis present

## 2022-10-18 DIAGNOSIS — M6259 Muscle wasting and atrophy, not elsewhere classified, multiple sites: Secondary | ICD-10-CM | POA: Diagnosis not present

## 2022-10-18 DIAGNOSIS — Z953 Presence of xenogenic heart valve: Secondary | ICD-10-CM | POA: Diagnosis not present

## 2022-10-18 DIAGNOSIS — S199XXA Unspecified injury of neck, initial encounter: Secondary | ICD-10-CM | POA: Diagnosis not present

## 2022-10-18 DIAGNOSIS — R531 Weakness: Secondary | ICD-10-CM | POA: Diagnosis present

## 2022-10-18 DIAGNOSIS — G928 Other toxic encephalopathy: Secondary | ICD-10-CM | POA: Diagnosis present

## 2022-10-18 DIAGNOSIS — F419 Anxiety disorder, unspecified: Secondary | ICD-10-CM | POA: Diagnosis present

## 2022-10-18 LAB — CBC WITH DIFFERENTIAL/PLATELET
Abs Immature Granulocytes: 0.08 10*3/uL — ABNORMAL HIGH (ref 0.00–0.07)
Basophils Absolute: 0 10*3/uL (ref 0.0–0.1)
Basophils Relative: 0 %
Eosinophils Absolute: 0 10*3/uL (ref 0.0–0.5)
Eosinophils Relative: 0 %
HCT: 37.8 % (ref 36.0–46.0)
Hemoglobin: 12.1 g/dL (ref 12.0–15.0)
Immature Granulocytes: 1 %
Lymphocytes Relative: 3 %
Lymphs Abs: 0.5 10*3/uL — ABNORMAL LOW (ref 0.7–4.0)
MCH: 30.4 pg (ref 26.0–34.0)
MCHC: 32 g/dL (ref 30.0–36.0)
MCV: 95 fL (ref 80.0–100.0)
Monocytes Absolute: 1.2 10*3/uL — ABNORMAL HIGH (ref 0.1–1.0)
Monocytes Relative: 7 %
Neutro Abs: 16 10*3/uL — ABNORMAL HIGH (ref 1.7–7.7)
Neutrophils Relative %: 89 %
Platelets: 208 10*3/uL (ref 150–400)
RBC: 3.98 MIL/uL (ref 3.87–5.11)
RDW: 14 % (ref 11.5–15.5)
WBC: 17.8 10*3/uL — ABNORMAL HIGH (ref 4.0–10.5)
nRBC: 0 % (ref 0.0–0.2)

## 2022-10-18 LAB — COMPREHENSIVE METABOLIC PANEL
ALT: 26 U/L (ref 0–44)
AST: 32 U/L (ref 15–41)
Albumin: 3.2 g/dL — ABNORMAL LOW (ref 3.5–5.0)
Alkaline Phosphatase: 37 U/L — ABNORMAL LOW (ref 38–126)
Anion gap: 13 (ref 5–15)
BUN: 15 mg/dL (ref 8–23)
CO2: 27 mmol/L (ref 22–32)
Calcium: 8.9 mg/dL (ref 8.9–10.3)
Chloride: 95 mmol/L — ABNORMAL LOW (ref 98–111)
Creatinine, Ser: 0.56 mg/dL (ref 0.44–1.00)
GFR, Estimated: >60 mL/min (ref >60)
Glucose, Bld: 157 mg/dL — ABNORMAL HIGH (ref 70–99)
Potassium: 3.2 mmol/L — ABNORMAL LOW (ref 3.5–5.1)
Sodium: 135 mmol/L (ref 135–145)
Total Bilirubin: 0.6 mg/dL (ref 0.3–1.2)
Total Protein: 5.6 g/dL — ABNORMAL LOW (ref 6.5–8.1)

## 2022-10-18 LAB — CUP PACEART REMOTE DEVICE CHECK
Battery Remaining Longevity: 83 mo
Battery Remaining Percentage: 82 %
Battery Voltage: 2.99 V
Brady Statistic AP VP Percent: 31 %
Brady Statistic AP VS Percent: 1 %
Brady Statistic AS VP Percent: 69 %
Brady Statistic AS VS Percent: 1 %
Brady Statistic RA Percent Paced: 16 %
Brady Statistic RV Percent Paced: 99 %
Date Time Interrogation Session: 20240528020013
Implantable Lead Connection Status: 753985
Implantable Lead Connection Status: 753985
Implantable Lead Implant Date: 20220826
Implantable Lead Implant Date: 20220826
Implantable Lead Location: 753859
Implantable Lead Location: 753860
Implantable Pulse Generator Implant Date: 20220826
Lead Channel Impedance Value: 400 Ohm
Lead Channel Impedance Value: 460 Ohm
Lead Channel Pacing Threshold Amplitude: 1 V
Lead Channel Pacing Threshold Amplitude: 1 V
Lead Channel Pacing Threshold Pulse Width: 0.5 ms
Lead Channel Pacing Threshold Pulse Width: 0.6 ms
Lead Channel Sensing Intrinsic Amplitude: 12 mV
Lead Channel Sensing Intrinsic Amplitude: 2.8 mV
Lead Channel Setting Pacing Amplitude: 2 V
Lead Channel Setting Pacing Amplitude: 2.5 V
Lead Channel Setting Pacing Pulse Width: 0.5 ms
Lead Channel Setting Sensing Sensitivity: 2 mV
Pulse Gen Model: 2272
Pulse Gen Serial Number: 3953466

## 2022-10-18 LAB — RETICULOCYTES
Immature Retic Fract: 1.6 % — ABNORMAL LOW (ref 2.3–15.9)
RBC.: 4.07 MIL/uL (ref 3.87–5.11)
Retic Count, Absolute: 53.3 10*3/uL (ref 19.0–186.0)
Retic Ct Pct: 1.3 % (ref 0.4–3.1)

## 2022-10-18 LAB — URINALYSIS, ROUTINE W REFLEX MICROSCOPIC
Bilirubin Urine: NEGATIVE
Glucose, UA: 50 mg/dL — AB
Ketones, ur: NEGATIVE mg/dL
Nitrite: NEGATIVE
Protein, ur: NEGATIVE mg/dL
Specific Gravity, Urine: 1.012 (ref 1.005–1.030)
pH: 5 (ref 5.0–8.0)

## 2022-10-18 LAB — LACTIC ACID, PLASMA: Lactic Acid, Venous: 1.2 mmol/L (ref 0.5–1.9)

## 2022-10-18 LAB — CREATININE, URINE, RANDOM: Creatinine, Urine: 30 mg/dL

## 2022-10-18 LAB — OSMOLALITY, URINE: Osmolality, Ur: 459 mOsm/kg (ref 300–900)

## 2022-10-18 LAB — CK: Total CK: 357 U/L — ABNORMAL HIGH (ref 38–234)

## 2022-10-18 LAB — TSH: TSH: 2.332 u[IU]/mL (ref 0.350–4.500)

## 2022-10-18 LAB — CBG MONITORING, ED: Glucose-Capillary: 182 mg/dL — ABNORMAL HIGH (ref 70–99)

## 2022-10-18 LAB — SODIUM, URINE, RANDOM: Sodium, Ur: 85 mmol/L

## 2022-10-18 MED ORDER — SODIUM CHLORIDE 0.9 % IV SOLN
1.0000 g | INTRAVENOUS | Status: DC
  Administered 2022-10-19 – 2022-10-20 (×2): 1 g via INTRAVENOUS
  Filled 2022-10-18 (×2): qty 10

## 2022-10-18 MED ORDER — SODIUM CHLORIDE 0.9 % IV SOLN
1.0000 g | Freq: Once | INTRAVENOUS | Status: AC
  Administered 2022-10-18: 1 g via INTRAVENOUS
  Filled 2022-10-18: qty 10

## 2022-10-18 MED ORDER — POTASSIUM CHLORIDE 10 MEQ/100ML IV SOLN
10.0000 meq | INTRAVENOUS | Status: DC
  Administered 2022-10-19: 10 meq via INTRAVENOUS
  Filled 2022-10-18 (×2): qty 100

## 2022-10-18 MED ORDER — SODIUM CHLORIDE 0.9 % IV SOLN: INTRAVENOUS | Status: DC

## 2022-10-18 MED ORDER — IOHEXOL 350 MG/ML SOLN
75.0000 mL | Freq: Once | INTRAVENOUS | Status: AC | PRN
  Administered 2022-10-18: 75 mL via INTRAVENOUS

## 2022-10-18 NOTE — ED Provider Notes (Signed)
[facility] AT [facility] Provider Note   CSN: [id number] Arrival date & time: 10/18/22  1732     History  Chief Complaint  Patient presents with   [name] is a 89 y.o. female.  Pt is a 89 yo female with pmhx significant for COPD, GERD, HLD, CVA, skin cancer, afib (not on thinners), CAD, carotid artery disease, aortic stenosis, and fibromyalgia.  Pt was getting up from her bedside commode, lost balance and fell.  Pt has pain to her left shoulder and to left hip.  She was unable to get up by herself.  Pt was given 50 mcg fentanyl IV by EMS.  Pain is better now.  Pt does not think she hit her head.       Home Medications Prior to Admission medications   Medication Sig Start Date End Date Taking? Authorizing Provider  acetaminophen (TYLENOL) 650 MG CR tablet Take 650-1,300 mg by mouth 2 (two) times daily as needed for pain.    [provider]  amiodarone (PACERONE) 200 MG tablet Take 1 tablet (200 mg total) by mouth daily. 02/23/22   Camnitz, Andree Coss, MD  apixaban (ELIQUIS) 2.5 MG TABS tablet Take 1 tablet (2.5 mg total) by mouth 2 (two) times daily. 05/20/22   Kathleene Hazel, MD  Carboxymethylcellulose Sodium (EYE DROPS OP) Apply 2 drops to eye daily as needed (gritty eyes).    [provider]  Cholecalciferol (VITAMIN D-3) 25 MCG (1000 UT) CAPS Take 1,000 Units by mouth daily.    [provider]  docusate sodium (COLACE) 100 MG capsule Take 100 mg by mouth daily.    [provider]  doxycycline (VIBRAMYCIN) 100 MG capsule Take 1 capsule (100 mg total) by mouth 2 (two) times daily. 10/12/22   Arby Barrette, MD  Ensure (ENSURE) Take 237 mLs by mouth every other day.    [provider]  fluticasone (FLOVENT HFA) 220 MCG/ACT inhaler Inhale into the lungs 2 (two) times daily.    [provider]  furosemide (LASIX) 20 MG tablet Take 0.5 tablets (10 mg total) by mouth daily as  needed for fluid or edema (for weight gain >5 lbs). 09/07/22   Kathlen Mody, MD  hydrOXYzine (ATARAX) 10 MG tablet Take 1 tablet (10 mg total) by mouth 3 (three) times daily as needed for anxiety. 09/07/22   Kathlen Mody, MD  LINZESS 72 MCG capsule Take 1 capsule (72 mcg total) by mouth daily. 02/10/21   Medina-Vargas, Monina C, NP  loratadine (CLARITIN) 10 MG tablet Take 1 tablet (10 mg total) by mouth daily. 02/10/21   Medina-Vargas, Monina C, NP  nitroGLYCERIN (NITROSTAT) 0.4 MG SL tablet DISSOLVE 1 TABLET UNDER THE TONGUE EVERY 5 MINUTES AS NEEDED FOR CHEST PAIN 11/01/21   Kathleene Hazel, MD  pantoprazole (PROTONIX) 40 MG tablet Take 1 tablet (40 mg total) by mouth 2 (two) times daily. 01/26/22   Ronnald Nian, MD  predniSONE (DELTASONE) 20 MG tablet 2 tabs po daily x 3 days 10/12/22   Arby Barrette, MD  rosuvastatin (CRESTOR) 20 MG tablet TAKE 1 TABLET(20 MG) BY MOUTH DAILY Patient taking differently: Take 20 mg by mouth daily. 07/25/22   Ronnald Nian, MD  Spacer/Aero-Holding Rudean Curt Use as directed 09/14/22   Ronnald Nian, MD  VENTOLIN HFA 108 [phone] Base) MCG/ACT inhaler INHALE 2 PUFFS INTO THE LUNGS EVERY 6 HOURS AS NEEDED FOR WHEEZING OR SHORTNESS OF BREATH Patient  taking differently: Inhale 2 puffs into the lungs every 6 (six) hours as needed for wheezing or shortness of breath. 07/15/21   Ronnald Nian, MD  Wheat Dextrin (BENEFIBER PO) Take 1 Scoop by mouth daily as needed (fiber).    [provider]      Allergies    Other, Bactrim [sulfamethoxazole-trimethoprim], Amitriptyline hcl, Aspirin, Tape, and Zetia [ezetimibe]    Review of Systems   Review of Systems  Musculoskeletal:        Left hip, left shoulder  All other systems reviewed and are negative.   Physical Exam Updated Vital Signs BP (!) 111/58   Pulse 65   Temp 98 F (36.7 C) (Oral)   Resp 18   SpO2 99%  Physical Exam Vitals and nursing note reviewed.  Constitutional:      Appearance:  Normal appearance. She is underweight.  HENT:     Head: Normocephalic and atraumatic.     Right Ear: External ear normal.     Left Ear: External ear normal.     Nose: Nose normal.     Mouth/Throat:     Mouth: Mucous membranes are moist.     Pharynx: Oropharynx is clear.  Eyes:     Extraocular Movements: Extraocular movements intact.     Pupils: Pupils are equal, round, and reactive to light.  Cardiovascular:     Rate and Rhythm: Normal rate and regular rhythm.     Pulses: Normal pulses.     Heart sounds: Normal heart sounds.  Pulmonary:     Effort: Pulmonary effort is normal.     Breath sounds: Normal breath sounds.  Abdominal:     General: Abdomen is flat. Bowel sounds are normal.     Palpations: Abdomen is soft.  Musculoskeletal:        General: Normal range of motion.       Arms:     Cervical back: Normal range of motion and neck supple.       Legs:     Comments: Bruises to left hip and left lower  Skin:    General: Skin is warm.     Capillary Refill: Capillary refill takes less than 2 seconds.  Neurological:     General: No focal deficit present.     Mental Status: She is alert and oriented to person, place, and time.  Psychiatric:        Mood and Affect: Mood normal.        Behavior: Behavior normal.     ED Results / Procedures / Treatments   Labs (all labs ordered are listed, but only abnormal results are displayed) Labs Reviewed  CBC WITH DIFFERENTIAL/PLATELET - Abnormal; Notable for the following components:      Result Value   WBC 17.8 (*)    Neutro Abs 16.0 (*)    Lymphs Abs 0.5 (*)    Monocytes Absolute 1.2 (*)    Abs Immature Granulocytes 0.08 (*)    All other components within normal limits  COMPREHENSIVE METABOLIC PANEL - Abnormal; Notable for the following components:   Potassium 3.2 (*)    Chloride 95 (*)    Glucose, Bld 157 (*)    Total Protein 5.6 (*)    Albumin 3.2 (*)    Alkaline Phosphatase 37 (*)    All other components within normal  limits  URINALYSIS, ROUTINE W REFLEX MICROSCOPIC - Abnormal; Notable for the following components:   APPearance HAZY (*)    Glucose, UA 50 (*)  Hgb urine dipstick SMALL (*)    Leukocytes,Ua TRACE (*)    Bacteria, UA MANY (*)    All other components within normal limits  CK - Abnormal; Notable for the following components:   Total CK 357 (*)    All other components within normal limits  CBG MONITORING, ED - Abnormal; Notable for the following components:   Glucose-Capillary 182 (*)    All other components within normal limits  URINE CULTURE  CULTURE, BLOOD (ROUTINE X 2)  CULTURE, BLOOD (ROUTINE X 2)  LACTIC ACID, PLASMA  LACTIC ACID, PLASMA  CREATININE, URINE, RANDOM  OSMOLALITY, URINE  OSMOLALITY  TSH  SODIUM, URINE, RANDOM  PROTIME-INR  PREALBUMIN  LACTIC ACID, PLASMA  LACTIC ACID, PLASMA  VITAMIN B12  FOLATE  IRON AND TIBC  FERRITIN  RETICULOCYTES    EKG EKG Interpretation  Date/Time:  Tuesday Oct 18 2022 17:43:18 EDT Ventricular Rate:  70 PR Interval:    QRS Duration: 177 QT Interval:  567 QTC Calculation: 612 R Axis:   -82 Text Interpretation: vent paced rhythm No significant change since last tracing Confirmed by Jacalyn Lefevre [phone]) on 10/18/2022 5:49:09 PM  Radiology CT HEAD WO CONTRAST  Result Date: 10/18/2022 CLINICAL DATA:  Head trauma, minor (Age >= 65y); Neck trauma (Age >= 65y). History of prior strokes EXAM: CT HEAD WITHOUT CONTRAST CT CERVICAL SPINE WITHOUT CONTRAST TECHNIQUE: Multidetector CT imaging of the head and cervical spine was performed following the standard protocol without intravenous contrast. Multiplanar CT image reconstructions of the cervical spine were also generated. RADIATION DOSE REDUCTION: This exam was performed according to the departmental dose-optimization program which includes automated exposure control, adjustment of the mA and/or kV according to patient size and/or use of iterative reconstruction technique. COMPARISON:   CT angiography chest 10/12/2022 FINDINGS: CT HEAD FINDINGS Brain: Cerebral ventricle sizes are concordant with the degree of cerebral volume loss. Patchy and confluent areas of decreased attenuation are noted throughout the deep and periventricular white matter of the cerebral hemispheres bilaterally, compatible with chronic microvascular ischemic disease. Right parietotemporal and occipital lobe loss of gray-white matter differentiation. No parenchymal hemorrhage. No mass lesion. No extra-axial collection. No mass effect or midline shift. No hydrocephalus. Basilar cisterns are patent. Vascular: No hyperdense vessel. Atherosclerotic calcifications are present within the cavernous internal carotid and vertebral arteries. Skull: No acute fracture or focal lesion. Sinuses/Orbits: Paranasal sinuses and mastoid air cells are clear. Right lens replacement. Otherwise the orbits are unremarkable. Other: None. CT CERVICAL SPINE FINDINGS Alignment: Normal. Skull base and vertebrae: Multilevel moderate degenerative changes of the spine. Associated posterior disc osteophyte complex formation at the C3-C4 and C5-C6 levels. Associated multilevel moderate osseous neural foraminal stenosis. No acute fracture. No aggressive appearing focal osseous lesion or focal pathologic process. Soft tissues and spinal canal: No prevertebral fluid or swelling. No visible canal hematoma. Upper chest: Biapical pleural/pulmonary scarring. Emphysematous changes. Stable chronic 8 mm nodule like left upper lobe pulmonary nodule. Other: Severe atherosclerotic plaque of the aortic arch and its branches. Partially visualized cardiac pacemaker leads. IMPRESSION: 1. Likely subacute right parietotemporal and occipital lobe infarction. 2. No acute intracranial hemorrhage. 3. No acute displaced fracture or traumatic listhesis of the cervical spine. 4. Aortic Atherosclerosis (ICD10-I70.0) and Emphysema (ICD10-J43.9). These results were called by telephone at  the time of interpretation on 10/18/2022 at 8:55 pm to provider [facility] , who verbally acknowledged these results. Electronically Signed   By: Tish Frederickson M.D.   On: 10/18/2022 20:56   CT CERVICAL  SPINE WO CONTRAST  Result Date: 10/18/2022 CLINICAL DATA:  Head trauma, minor (Age >= 65y); Neck trauma (Age >= 65y). History of prior strokes EXAM: CT HEAD WITHOUT CONTRAST CT CERVICAL SPINE WITHOUT CONTRAST TECHNIQUE: Multidetector CT imaging of the head and cervical spine was performed following the standard protocol without intravenous contrast. Multiplanar CT image reconstructions of the cervical spine were also generated. RADIATION DOSE REDUCTION: This exam was performed according to the departmental dose-optimization program which includes automated exposure control, adjustment of the mA and/or kV according to patient size and/or use of iterative reconstruction technique. COMPARISON:  CT angiography chest 10/12/2022 FINDINGS: CT HEAD FINDINGS Brain: Cerebral ventricle sizes are concordant with the degree of cerebral volume loss. Patchy and confluent areas of decreased attenuation are noted throughout the deep and periventricular white matter of the cerebral hemispheres bilaterally, compatible with chronic microvascular ischemic disease. Right parietotemporal and occipital lobe loss of gray-white matter differentiation. No parenchymal hemorrhage. No mass lesion. No extra-axial collection. No mass effect or midline shift. No hydrocephalus. Basilar cisterns are patent. Vascular: No hyperdense vessel. Atherosclerotic calcifications are present within the cavernous internal carotid and vertebral arteries. Skull: No acute fracture or focal lesion. Sinuses/Orbits: Paranasal sinuses and mastoid air cells are clear. Right lens replacement. Otherwise the orbits are unremarkable. Other: None. CT CERVICAL SPINE FINDINGS Alignment: Normal. Skull base and vertebrae: Multilevel moderate degenerative changes of the  spine. Associated posterior disc osteophyte complex formation at the C3-C4 and C5-C6 levels. Associated multilevel moderate osseous neural foraminal stenosis. No acute fracture. No aggressive appearing focal osseous lesion or focal pathologic process. Soft tissues and spinal canal: No prevertebral fluid or swelling. No visible canal hematoma. Upper chest: Biapical pleural/pulmonary scarring. Emphysematous changes. Stable chronic 8 mm nodule like left upper lobe pulmonary nodule. Other: Severe atherosclerotic plaque of the aortic arch and its branches. Partially visualized cardiac pacemaker leads. IMPRESSION: 1. Likely subacute right parietotemporal and occipital lobe infarction. 2. No acute intracranial hemorrhage. 3. No acute displaced fracture or traumatic listhesis of the cervical spine. 4. Aortic Atherosclerosis (ICD10-I70.0) and Emphysema (ICD10-J43.9). These results were called by telephone at the time of interpretation on 10/18/2022 at 8:55 pm to provider [facility] , who verbally acknowledged these results. Electronically Signed   By: Tish Frederickson M.D.   On: 10/18/2022 20:56   DG Chest 2 View  Result Date: 10/18/2022 CLINICAL DATA:  fall EXAM: CHEST - 2 VIEW COMPARISON:  Chest x-ray 10/12/2022, ct chest 10/12/22, cxr 08/22/22 FINDINGS: Left chest wall dual lead pacemaker in grossly appropriate position. The heart and mediastinal contours are within normal limits. Status post aortic valve replacement. Surgical changes overlie the mediastinum. Aortic calcification. Right apical pleural/pulmonary scarring. Stable calcification along the right lower lobe. No focal consolidation. No pulmonary edema. No pleural effusion. No pneumothorax. No acute osseous abnormality. IMPRESSION: 1. No active cardiopulmonary disease. 2.  Aortic Atherosclerosis (ICD10-I70.0). Electronically Signed   By: Tish Frederickson M.D.   On: 10/18/2022 18:45   DG Shoulder Left  Result Date: 10/18/2022 CLINICAL DATA:  Recent fall with  left shoulder pain, initial encounter EXAM: LEFT SHOULDER - 2+ VIEW COMPARISON:  None Available. FINDINGS: Mild degenerative changes of the acromioclavicular joint are seen. No acute fracture or dislocation is noted. The underlying bony thorax appears within normal limits. Pacemaker is seen. IMPRESSION: No acute abnormality noted. Electronically Signed   By: Alcide Clever M.D.   On: 10/18/2022 18:44   DG Hip Unilat W or Wo Pelvis 2-3 Views Left  Result Date: 10/18/2022  CLINICAL DATA:  Recent fall with left hip pain, initial encounter EXAM: DG HIP (WITH OR WITHOUT PELVIS) 3V LEFT COMPARISON:  None Available. FINDINGS: Pelvic ring appears intact. No acute fracture or dislocation is noted. No soft tissue abnormality is seen. IMPRESSION: No acute abnormality noted. Electronically Signed   By: Alcide Clever M.D.   On: 10/18/2022 18:38   CUP PACEART REMOTE DEVICE CHECK  Result Date: 10/18/2022 Scheduled remote reviewed. Normal device function.  Known PAF, currently in progress from 5/27, controlled rates Eliquis per PA report Next remote 91 days. LA, CVRS   Procedures Procedures    Medications Ordered in ED Medications  0.9 %  sodium chloride infusion ( Intravenous New Bag/Given 10/18/22 2118)  cefTRIAXone (ROCEPHIN) 1 g in sodium chloride 0.9 % 100 mL IVPB (1 g Intravenous New Bag/Given 10/18/22 2138)  cefTRIAXone (ROCEPHIN) 1 g in sodium chloride 0.9 % 100 mL IVPB (has no administration in time range)  potassium chloride 10 mEq in 100 mL IVPB (has no administration in time range)    ED Course/ Medical Decision Making/ A&P                             Medical Decision Making Amount and/or Complexity of Data Reviewed Labs: ordered. Radiology: ordered.  Risk Prescription drug management. Decision regarding hospitalization.   This patient presents to the ED for concern of fall, this involves an extensive number of treatment options, and is a complaint that carries with it a high risk of  complications and morbidity.  The differential diagnosis includes multiple trauma   Co morbidities that complicate the patient evaluation  COPD, GERD, HLD, CVA, skin cancer, afib (not on thinners), CAD, carotid artery disease, aortic stenosis, and fibromyalgia   Additional history obtained:  Additional history obtained from epic chart review External records from outside source obtained and reviewed including EMS report   Lab Tests:  I Ordered, and personally interpreted labs.  The pertinent results include:  cbc with wbc elevated at 17.8, cmp with mild hypokalemia at 3.2, ck sl elevated at 357, ua + le, many bacteria, 11-20 wbcs   Imaging Studies ordered:  I ordered imaging studies including cxr, left shoulder, left hip, ct head/c-spine  I independently visualized and interpreted imaging which showed  CXR:  No active cardiopulmonary disease.  2.  Aortic Atherosclerosis (ICD10-I70.0).  L shoulder: No acute abnormality noted.  L hip: No acute abnormality noted.  CT head/c-cspine: Likely subacute right parietotemporal and occipital lobe  infarction.  2. No acute intracranial hemorrhage.  3. No acute displaced fracture or traumatic listhesis of the  cervical spine.  4. Aortic Atherosclerosis (ICD10-I70.0) and Emphysema (ICD10-J43.9).    These results were called by telephone at the time of interpretation  on 10/18/2022 at 8:55 pm to provider [facility] , who verbally  acknowledged these results.   I agree with the radiologist interpretation   Cardiac Monitoring:  The patient was maintained on a cardiac monitor.  I personally viewed and interpreted the cardiac monitored which showed an underlying rhythm of: nsr   Medicines ordered and prescription drug management:  I ordered medication including IVFs  for sx Reevaluation of the patient after these medicines showed that the patient improved I have reviewed the patients home medicines and have made adjustments as  needed   Test Considered:  ct   Critical Interventions:  ivfs   Consultations Obtained:  I requested consultation with the neurologist (Dr.  Wilford Corner),  and discussed lab and imaging findings as well as pertinent plan -he recommends admission for stroke work up Pt d/w Dr. Adela Glimpse ([facility]) who will admit   Problem List / ED Course:  Fall:  no fx noted.  I did add on a tib fib xr as bruises have shown up there since she's been here.   Subacute CVA:  pt does not have any recent imaging to compare.  Daughter does not notice any new weakness.  She is not ambulatory.  We can't get a MRI now as she has a pacer. UTI:  pt started on rocephin Hx afib: not taking thinners now   Reevaluation:  After the interventions noted above, I reevaluated the patient and found that they have :improved   Social Determinants of Health:  Lives at home   Dispostion:  After consideration of the diagnostic results and the patients response to treatment, I feel that the patent would benefit from admission.          Final Clinical Impression(s) / ED Diagnoses Final diagnoses:  Fall, initial encounter  Cerebrovascular accident (CVA), unspecified mechanism (HCC)    Rx / DC Orders ED Discharge Orders     None         Jacalyn Lefevre, MD 10/18/22 2202

## 2022-10-18 NOTE — ED Notes (Signed)
Patient transported to CT 

## 2022-10-18 NOTE — H&P (Signed)
[name] NFA:[id number] DOB: 12-02-1928 DOA: 10/18/2022     PCP: Ronnald Nian, MD   Outpatient Specialists:  CARDS: Dr. Verne Carrow, MD  POdiatrist Logan Bores  Patient arrived to ER on 10/18/22 at 1732 Referred by Attending Jacalyn Lefevre, MD   Patient coming from:    home Lives With family    Chief Complaint:   Chief Complaint  Patient presents with   Fall    HPI: [name] is a 89 y.o. female with medical history significant of A.fib on eliquisprior CVA  Sp pacemaker, COPD  Presented with   fall loss of blance Lives at home w doughter  Hx of A.fib prior strokes  She had a mechanicl fall today  Not able to get up  Loss balance CT head showed acute cva    Recently was treated with doxycycline and prednisone taper for Copd exacerbation  She is still taking doxycycline but finished prednsione  Denies significant ETOH intake   Does not smoke   Lab Results  Component Value Date   SARSCOV2NAA NEGATIVE 01/18/2021   SARSCOV2NAA NEGATIVE 01/14/2021   SARSCOV2NAA NEGATIVE 01/14/2021   SARSCOV2NAA NEGATIVE 04/22/2020      Regarding pertinent Chronic problems:     Hyperlipidemia -  on statins Crestor Lipid Panel     Component Value Date/Time   CHOL 151 11/13/2019 1529   TRIG 84 11/13/2019 1529   HDL 77 11/13/2019 1529   CHOLHDL 2.0 11/13/2019 1529   CHOLHDL 2.0 08/21/2015 0001   VLDL 17 08/21/2015 0001   LDLCALC 58 11/13/2019 1529   LABVLDL 16 11/13/2019 1529     HTN on lasix   chronic CHF diastolic/systolic/ combined - last echo  Recent Results (from the past 46962 hour(s))  ECHOCARDIOGRAM COMPLETE   Collection Time: 09/06/22  2:48 PM  Result Value   Weight 1,455.99   Height 62   BP 116/47   S' Lateral 2.90   AR max vel 1.81   AV Area VTI 1.71   AV Mean grad 8.0   AV Peak grad 13.2   Ao pk vel 1.82   Area-P 1/2 2.60   AV Area mean vel 1.77   MV VTI 1.71   Est EF 45 - 50%   Narrative      ECHOCARDIOGRAM REPORT       IMPRESSIONS    1. Left ventricular ejection fraction, by estimation, is 45 to 50%. The left ventricle has mildly decreased function. The left ventricle demonstrates global hypokinesis. Left ventricular diastolic parameters are indeterminate.  2. Right ventricular systolic function is mildly reduced. The right ventricular size is normal. There is normal pulmonary artery systolic pressure. The estimated right ventricular systolic pressure is 29.4 mmHg.  3. Left atrial size was mildly dilated.  4. The mitral valve is abnormal. Mild to moderate mitral valve regurgitation. Mild mitral stenosis. The mean mitral valve gradient is 3.0 mmHg with average heart rate of 63 bpm.  5. Trivial, central aortic regurgitation. The aortic valve has been repaired/replaced. Aortic valve regurgitation is trivial. There is a 23 mm Sapien prosthetic (TAVR) valve present in the aortic position. Procedure Date: 11/20/2016.  6. Pulmonic valve regurgitation is moderate.  7. The inferior vena cava is normal in size with greater than 50% respiratory variability, suggesting right atrial pressure of 3 mmHg.  Comparison(s): Mitral valve regurgitation and RVSP have improved from prior. This study is more technically difficult.            CADs  sp CABG w Maze   - On Aspirin, statin, betablocker, Plavix                 - *followed by cardiology                - last cardiac cath      Aortic valve AS s/p TAVR (2018)  severe MR         COPD - not  followed by pulmonology        Hx of CVA -  with/out residual deficits on Eliquis   A. Fib -  - CHA2DS2 vas score   8      current  on anticoagulation with  Eliquis,            - Rhythm control:   amiodarone,      While in ER:   CT showed possible new CVA neurology was consulted recommended admission for stroke workup UA suspicious for UTI started antibiotics     Lab Orders         Urine Culture         Blood culture (routine x 2)         CBC WITH DIFFERENTIAL          Comprehensive metabolic panel         Urinalysis, Routine w reflex microscopic -Urine, Clean Catch         CK         Lactic acid, plasma         CBG monitoring, ED      CT HEAD . Likely subacute right parietotemporal and occipital lobe infarction. 2. No acute intracranial hemorrhage. 3. No acute displaced fracture or traumatic listhesis of the cervical spine. 4. Aortic Atherosclerosis (ICD10-I70.0) and Emphysema (ICD10-J43.9).     Pelvic films NON acute  CXR -  NON acute    Following Medications were ordered in ER: Medications  0.9 %  sodium chloride infusion ( Intravenous New Bag/Given 10/18/22 2118)  cefTRIAXone (ROCEPHIN) 1 g in sodium chloride 0.9 % 100 mL IVPB (1 g Intravenous New Bag/Given 10/18/22 2138)    _______________________________________________________ ER Provider Called:  NEurology     Dr. Wilford Corner They Recommend admit to medicine       ED Triage Vitals  Enc Vitals Group     BP 10/18/22 1745 (!) 131/57     Pulse Rate 10/18/22 1745 70     Resp 10/18/22 1745 (!) 23     Temp 10/18/22 1747 97.8 F (36.6 C)     Temp Source 10/18/22 1747 Oral     SpO2 10/18/22 1745 98 %     Weight --      Height --      Head Circumference --      Peak Flow --      Pain Score --      Pain Loc --      Pain Edu? --      Excl. in GC? --   TMAX(24)@     _________________________________________ Significant initial  Findings: Abnormal Labs Reviewed  CBC WITH DIFFERENTIAL/PLATELET - Abnormal; Notable for the following components:      Result Value   WBC 17.8 (*)    Neutro Abs 16.0 (*)    Lymphs Abs 0.5 (*)    Monocytes Absolute 1.2 (*)    Abs Immature Granulocytes 0.08 (*)    All other components within normal limits  COMPREHENSIVE METABOLIC PANEL - Abnormal; Notable for the  following components:   Potassium 3.2 (*)    Chloride 95 (*)    Glucose, Bld 157 (*)    Total Protein 5.6 (*)    Albumin 3.2 (*)    Alkaline Phosphatase 37 (*)    All other components within  normal limits  URINALYSIS, ROUTINE W REFLEX MICROSCOPIC - Abnormal; Notable for the following components:   APPearance HAZY (*)    Glucose, UA 50 (*)    Hgb urine dipstick SMALL (*)    Leukocytes,Ua TRACE (*)    Bacteria, UA MANY (*)    All other components within normal limits  CK - Abnormal; Notable for the following components:   Total CK 357 (*)    All other components within normal limits  CBG MONITORING, ED - Abnormal; Notable for the following components:   Glucose-Capillary 182 (*)    All other components within normal limits      _________________________ Troponin ***ordered Cardiac Panel (last 3 results) Recent Labs    10/18/22 1900  CKTOTAL 357*     ECG: Ordered Personally reviewed and interpreted by me showing: HR : 70 Rhythm: vent paced rhythm No significant change since last tracing QTC 613  BNP (last 3 results) Recent Labs    02/18/22 0015 09/06/22 0002 10/12/22 1735  BNP 185.7* 170.7* 134.6*     The recent clinical data is shown below. Vitals:   10/18/22 1945 10/18/22 2000 10/18/22 2015 10/18/22 2134  BP: (!) 99/45 (!) 103/46 (!) 111/58   Pulse: 70 69 65   Resp: (!) 28 19 18    Temp:    98 F (36.7 C)  TempSrc:    Oral  SpO2: 100% 99% 99%       WBC     Component Value Date/Time   WBC 17.8 (H) 10/18/2022 1900   LYMPHSABS 0.5 (L) 10/18/2022 1900   LYMPHSABS 1.0 10/07/2021 1559   MONOABS 1.2 (H) 10/18/2022 1900   EOSABS 0.0 10/18/2022 1900   EOSABS 0.1 10/07/2021 1559   BASOSABS 0.0 10/18/2022 1900   BASOSABS 0.0 10/07/2021 1559    Lactic Acid, Venous    Component Value Date/Time   LATICACIDVEN 1.3 04/22/2020 2027    Procalcitonin *** Ordered      UA  evidence of UTI     Urine analysis:    Component Value Date/Time   COLORURINE YELLOW 10/18/2022 2021   APPEARANCEUR HAZY (A) 10/18/2022 2021   LABSPEC 1.012 10/18/2022 2021   LABSPEC 1.010 01/08/2019 1526   PHURINE 5.0 10/18/2022 2021   GLUCOSEU 50 (A) 10/18/2022 2021    HGBUR SMALL (A) 10/18/2022 2021   BILIRUBINUR NEGATIVE 10/18/2022 2021   BILIRUBINUR negative 01/08/2019 1526   BILIRUBINUR n 12/05/2011 1627   KETONESUR NEGATIVE 10/18/2022 2021   PROTEINUR NEGATIVE 10/18/2022 2021   UROBILINOGEN 0.2 05/19/2014 1601   NITRITE NEGATIVE 10/18/2022 2021   LEUKOCYTESUR TRACE (A) 10/18/2022 2021     ABX started Antibiotics Given (last 72 hours)     Date/Time Action Medication Dose Rate   10/18/22 2138 New Bag/Given   cefTRIAXone (ROCEPHIN) 1 g in sodium chloride 0.9 % 100 mL IVPB 1 g 200 mL/hr       No results found for the last 90 days.   ___________________________________________________ Recent Labs  Lab 10/12/22 1735 10/18/22 1900  NA 134* 135  K 3.6 3.2*  CO2 27 27  GLUCOSE 104* 157*  BUN 5* 15  CREATININE 0.52 0.56  CALCIUM 8.7* 8.9    Cr   stable,  Lab Results  Component Value Date   CREATININE 0.56 10/18/2022   CREATININE 0.52 10/12/2022   CREATININE 0.65 09/06/2022    Recent Labs  Lab 10/12/22 1735 10/18/22 1900  AST 20 32  ALT 15 26  ALKPHOS 48 37*  BILITOT 0.8 0.6  PROT 6.2* 5.6*  ALBUMIN 3.3* 3.2*   Lab Results  Component Value Date   CALCIUM 8.9 10/18/2022   PHOS 2.8 01/18/2021    Plt: Lab Results  Component Value Date   PLT 208 10/18/2022       Recent Labs  Lab 10/12/22 1735 10/18/22 1900  WBC 6.7 17.8*  NEUTROABS  --  16.0*  HGB 11.3* 12.1  HCT 35.8* 37.8  MCV 93.7 95.0  PLT 244 208    HG/HCT   stable,     Component Value Date/Time   HGB 12.1 10/18/2022 1900   HGB 13.0 08/18/2022 1655   HCT 37.8 10/18/2022 1900   HCT 38.9 08/18/2022 1655   MCV 95.0 10/18/2022 1900   MCV 93 08/18/2022 1655    _______________________________________________ Hospitalist was called for admission for new CVA, UTI, fall   The following Work up has been ordered so far:  Orders Placed This Encounter  Procedures   Urine Culture   Blood culture (routine x 2)   DG Chest 2 View   CT HEAD WO CONTRAST    CT CERVICAL SPINE WO CONTRAST   DG Hip Unilat W or Wo Pelvis 2-3 Views Left   DG Shoulder Left   CT ANGIO HEAD NECK W WO CM   CBC WITH DIFFERENTIAL   Comprehensive metabolic panel   Urinalysis, Routine w reflex microscopic -Urine, Clean Catch   CK   Lactic acid, plasma   ED Cardiac monitoring   Initiate Carrier Fluid Protocol   Consult to neurology   Consult to hospitalist   CBG monitoring, ED   EKG 12-Lead     OTHER Significant initial  Findings:  labs showing:     DM  labs:  HbA1C: No results for input(s): "HGBA1C" in the last 8760 hours.     CBG (last 3)  Recent Labs    10/18/22 1752  GLUCAP 182*          Cultures:    Component Value Date/Time   SDES BLOOD LEFT ANTECUBITAL 04/22/2020 2037   SPECREQUEST  04/22/2020 2037    BOTTLES DRAWN AEROBIC AND ANAEROBIC Blood Culture adequate volume   CULT  04/22/2020 2037    NO GROWTH 5 DAYS Performed at [facility], [address], [place], [place] [address]    REPTSTATUS 04/27/2020 FINAL 04/22/2020 2037     Radiological Exams on Admission: CT HEAD WO CONTRAST  Result Date: 10/18/2022 CLINICAL DATA:  Head trauma, minor (Age >= 65y); Neck trauma (Age >= 65y). History of prior strokes EXAM: CT HEAD WITHOUT CONTRAST CT CERVICAL SPINE WITHOUT CONTRAST TECHNIQUE: Multidetector CT imaging of the head and cervical spine was performed following the standard protocol without intravenous contrast. Multiplanar CT image reconstructions of the cervical spine were also generated. RADIATION DOSE REDUCTION: This exam was performed according to the departmental dose-optimization program which includes automated exposure control, adjustment of the mA and/or kV according to patient size and/or use of iterative reconstruction technique. COMPARISON:  CT angiography chest 10/12/2022 FINDINGS: CT HEAD FINDINGS Brain: Cerebral ventricle sizes are concordant with the degree of cerebral volume loss. Patchy and confluent areas of decreased  attenuation are noted throughout the deep and periventricular white matter of  the cerebral hemispheres bilaterally, compatible with chronic microvascular ischemic disease. Right parietotemporal and occipital lobe loss of gray-white matter differentiation. No parenchymal hemorrhage. No mass lesion. No extra-axial collection. No mass effect or midline shift. No hydrocephalus. Basilar cisterns are patent. Vascular: No hyperdense vessel. Atherosclerotic calcifications are present within the cavernous internal carotid and vertebral arteries. Skull: No acute fracture or focal lesion. Sinuses/Orbits: Paranasal sinuses and mastoid air cells are clear. Right lens replacement. Otherwise the orbits are unremarkable. Other: None. CT CERVICAL SPINE FINDINGS Alignment: Normal. Skull base and vertebrae: Multilevel moderate degenerative changes of the spine. Associated posterior disc osteophyte complex formation at the C3-C4 and C5-C6 levels. Associated multilevel moderate osseous neural foraminal stenosis. No acute fracture. No aggressive appearing focal osseous lesion or focal pathologic process. Soft tissues and spinal canal: No prevertebral fluid or swelling. No visible canal hematoma. Upper chest: Biapical pleural/pulmonary scarring. Emphysematous changes. Stable chronic 8 mm nodule like left upper lobe pulmonary nodule. Other: Severe atherosclerotic plaque of the aortic arch and its branches. Partially visualized cardiac pacemaker leads. IMPRESSION: 1. Likely subacute right parietotemporal and occipital lobe infarction. 2. No acute intracranial hemorrhage. 3. No acute displaced fracture or traumatic listhesis of the cervical spine. 4. Aortic Atherosclerosis (ICD10-I70.0) and Emphysema (ICD10-J43.9). These results were called by telephone at the time of interpretation on 10/18/2022 at 8:55 pm to provider [facility] , who verbally acknowledged these results. Electronically Signed   By: Tish Frederickson M.D.   On: 10/18/2022  20:56   CT CERVICAL SPINE WO CONTRAST  Result Date: 10/18/2022 CLINICAL DATA:  Head trauma, minor (Age >= 65y); Neck trauma (Age >= 65y). History of prior strokes EXAM: CT HEAD WITHOUT CONTRAST CT CERVICAL SPINE WITHOUT CONTRAST TECHNIQUE: Multidetector CT imaging of the head and cervical spine was performed following the standard protocol without intravenous contrast. Multiplanar CT image reconstructions of the cervical spine were also generated. RADIATION DOSE REDUCTION: This exam was performed according to the departmental dose-optimization program which includes automated exposure control, adjustment of the mA and/or kV according to patient size and/or use of iterative reconstruction technique. COMPARISON:  CT angiography chest 10/12/2022 FINDINGS: CT HEAD FINDINGS Brain: Cerebral ventricle sizes are concordant with the degree of cerebral volume loss. Patchy and confluent areas of decreased attenuation are noted throughout the deep and periventricular white matter of the cerebral hemispheres bilaterally, compatible with chronic microvascular ischemic disease. Right parietotemporal and occipital lobe loss of gray-white matter differentiation. No parenchymal hemorrhage. No mass lesion. No extra-axial collection. No mass effect or midline shift. No hydrocephalus. Basilar cisterns are patent. Vascular: No hyperdense vessel. Atherosclerotic calcifications are present within the cavernous internal carotid and vertebral arteries. Skull: No acute fracture or focal lesion. Sinuses/Orbits: Paranasal sinuses and mastoid air cells are clear. Right lens replacement. Otherwise the orbits are unremarkable. Other: None. CT CERVICAL SPINE FINDINGS Alignment: Normal. Skull base and vertebrae: Multilevel moderate degenerative changes of the spine. Associated posterior disc osteophyte complex formation at the C3-C4 and C5-C6 levels. Associated multilevel moderate osseous neural foraminal stenosis. No acute fracture. No  aggressive appearing focal osseous lesion or focal pathologic process. Soft tissues and spinal canal: No prevertebral fluid or swelling. No visible canal hematoma. Upper chest: Biapical pleural/pulmonary scarring. Emphysematous changes. Stable chronic 8 mm nodule like left upper lobe pulmonary nodule. Other: Severe atherosclerotic plaque of the aortic arch and its branches. Partially visualized cardiac pacemaker leads. IMPRESSION: 1. Likely subacute right parietotemporal and occipital lobe infarction. 2. No acute intracranial hemorrhage. 3. No acute displaced fracture or  traumatic listhesis of the cervical spine. 4. Aortic Atherosclerosis (ICD10-I70.0) and Emphysema (ICD10-J43.9). These results were called by telephone at the time of interpretation on 10/18/2022 at 8:55 pm to provider [facility] , who verbally acknowledged these results. Electronically Signed   By: Tish Frederickson M.D.   On: 10/18/2022 20:56   DG Chest 2 View  Result Date: 10/18/2022 CLINICAL DATA:  fall EXAM: CHEST - 2 VIEW COMPARISON:  Chest x-ray 10/12/2022, ct chest 10/12/22, cxr 08/22/22 FINDINGS: Left chest wall dual lead pacemaker in grossly appropriate position. The heart and mediastinal contours are within normal limits. Status post aortic valve replacement. Surgical changes overlie the mediastinum. Aortic calcification. Right apical pleural/pulmonary scarring. Stable calcification along the right lower lobe. No focal consolidation. No pulmonary edema. No pleural effusion. No pneumothorax. No acute osseous abnormality. IMPRESSION: 1. No active cardiopulmonary disease. 2.  Aortic Atherosclerosis (ICD10-I70.0). Electronically Signed   By: Tish Frederickson M.D.   On: 10/18/2022 18:45   DG Shoulder Left  Result Date: 10/18/2022 CLINICAL DATA:  Recent fall with left shoulder pain, initial encounter EXAM: LEFT SHOULDER - 2+ VIEW COMPARISON:  None Available. FINDINGS: Mild degenerative changes of the acromioclavicular joint are seen. No  acute fracture or dislocation is noted. The underlying bony thorax appears within normal limits. Pacemaker is seen. IMPRESSION: No acute abnormality noted. Electronically Signed   By: Alcide Clever M.D.   On: 10/18/2022 18:44   DG Hip Unilat W or Wo Pelvis 2-3 Views Left  Result Date: 10/18/2022 CLINICAL DATA:  Recent fall with left hip pain, initial encounter EXAM: DG HIP (WITH OR WITHOUT PELVIS) 3V LEFT COMPARISON:  None Available. FINDINGS: Pelvic ring appears intact. No acute fracture or dislocation is noted. No soft tissue abnormality is seen. IMPRESSION: No acute abnormality noted. Electronically Signed   By: Alcide Clever M.D.   On: 10/18/2022 18:38   CUP PACEART REMOTE DEVICE CHECK  Result Date: 10/18/2022 Scheduled remote reviewed. Normal device function.  Known PAF, currently in progress from 5/27, controlled rates Eliquis per PA report Next remote 91 days. LA, CVRS  _______________________________________________________________________________________________________ Latest  Blood pressure (!) 111/58, pulse 65, temperature 98 F (36.7 C), temperature source Oral, resp. rate 18, SpO2 99 %.   Vitals  labs and radiology finding personally reviewed  Review of Systems:    Pertinent positives include: ***  Constitutional:  No weight loss, night sweats, Fevers, chills, fatigue, weight loss  HEENT:  No headaches, Difficulty swallowing,Tooth/dental problems,Sore throat,  No sneezing, itching, ear ache, nasal congestion, post nasal drip,  Cardio-vascular:  No chest pain, Orthopnea, PND, anasarca, dizziness, palpitations.no Bilateral lower extremity swelling  GI:  No heartburn, indigestion, abdominal pain, nausea, vomiting, diarrhea, change in bowel habits, loss of appetite, melena, blood in stool, hematemesis Resp:  no shortness of breath at rest. No dyspnea on exertion, No excess mucus, no productive cough, No non-productive cough, No coughing up of blood.No change in color of mucus.No  wheezing. Skin:  no rash or lesions. No jaundice GU:  no dysuria, change in color of urine, no urgency or frequency. No straining to urinate.  No flank pain.  Musculoskeletal:  No joint pain or no joint swelling. No decreased range of motion. No back pain.  Psych:  No change in mood or affect. No depression or anxiety. No memory loss.  Neuro: no localizing neurological complaints, no tingling, no weakness, no double vision, no gait abnormality, no slurred speech, no confusion  All systems reviewed and apart from HOPI all are  negative _______________________________________________________________________________________________ Past Medical History:   Past Medical History:  Diagnosis Date   Anxiety    Aortic Stenosis s/p TAVR    Echo 10/21: EF 55-60, no RWMA, mild asymmetric LVH, normal RVSF, RVSP 40.2 mmHg, mild MR, s/p TAVR with mean gradient 8.32mmHg, no PVL   Arthritis    OSTEO   Aspirin allergy    on Plavix   Asthma    CAD (coronary artery disease)    a. s/p CABG 2006 with Cox-Maze procedure.   Carotid artery disease (HCC)    a. s/p R CEA.   Diastolic dysfunction    Eczema    Fibromyalgia    GERD (gastroesophageal reflux disease)    Hiatal hernia    Hyperlipidemia    Hypertension    Kyphoscoliosis    Mitral regurgitation    PAF (paroxysmal atrial fibrillation) (HCC)    a. pt has h/o hematuria on Eliquis and has since refused anticoagulation   Pneumonia 2015 ?   Pulmonary regurgitation    Skin cancer of arm    Stroke [facility])    Tricuspid regurgitation       Past Surgical History:  Procedure Laterality Date   ABDOMINAL HYSTERECTOMY  1976   CAROTID ENDARTERECTOMY  2010   CORONARY ANGIOPLASTY WITH STENT PLACEMENT     CORONARY ARTERY BYPASS GRAFT  01/2005   LIMA-D1; SVG-LAD; SVG-OM; SVG-PDA   ERCP N/A 12/28/2021   Procedure: ENDOSCOPIC RETROGRADE CHOLANGIOPANCREATOGRAPHY (ERCP);  Surgeon: Jeani Hawking, MD;  Location: [facility] ENDOSCOPY;  Service: Gastroenterology;   Laterality: N/A;   ESOPHAGOGASTRODUODENOSCOPY (EGD) WITH PROPOFOL N/A 03/19/2021   Procedure: ESOPHAGOGASTRODUODENOSCOPY (EGD) WITH PROPOFOL;  Surgeon: Jeani Hawking, MD;  Location: [facility] ENDOSCOPY;  Service: Endoscopy;  Laterality: N/A;   EYE SURGERY     MULTIPLE EXTRACTIONS WITH ALVEOLOPLASTY  12/28/2016   Extraction of tooth #'s 2- 5,7-10, 12,13,17-20,and 22-29 with alveoloplasty and maxillary right and left lateral exostoses reductions   MULTIPLE EXTRACTIONS WITH ALVEOLOPLASTY N/A 12/28/2016   Procedure: Extraction of tooth #'s 2- 5,7-10, 12,13,17-20,and 22-29 with alveoloplasty and maxillary right and left lateral exostoses reductions;  Surgeon: Charlynne Pander, DDS;  Location: [facility] OR;  Service: Oral Surgery;  Laterality: N/A;   PACEMAKER IMPLANT N/A 01/15/2021   Procedure: PACEMAKER IMPLANT;  Surgeon: Regan Lemming, MD;  Location: [facility] INVASIVE CV LAB;  Service: Cardiovascular;  Laterality: N/A;   REMOVAL OF STONES  12/28/2021   Procedure: REMOVAL OF STONES;  Surgeon: Jeani Hawking, MD;  Location: [facility] ENDOSCOPY;  Service: Gastroenterology;;   RIGHT/LEFT HEART CATH AND CORONARY/GRAFT ANGIOGRAPHY N/A 10/27/2016   Procedure: Right/Left Heart Cath and Coronary/Graft Angiography;  Surgeon: Kathleene Hazel, MD;  Location: [facility] INVASIVE CV LAB;  Service: Cardiovascular;  Laterality: N/A;   SAVORY DILATION N/A 03/19/2021   Procedure: SAVORY DILATION;  Surgeon: Jeani Hawking, MD;  Location: [facility] ENDOSCOPY;  Service: Endoscopy;  Laterality: N/A;   SPHINCTEROTOMY  12/28/2021   Procedure: SPHINCTEROTOMY;  Surgeon: Jeani Hawking, MD;  Location: [facility] ENDOSCOPY;  Service: Gastroenterology;;   TEE WITHOUT CARDIOVERSION N/A 01/03/2017   Procedure: TRANSESOPHAGEAL ECHOCARDIOGRAM (TEE);  Surgeon: Kathleene Hazel, MD;  Location: [facility] OR;  Service: Open Heart Surgery;  Laterality: N/A;   TONSILLECTOMY     TRANSCATHETER AORTIC VALVE REPLACEMENT, TRANSFEMORAL N/A 01/03/2017   Procedure: TRANSCATHETER AORTIC  VALVE REPLACEMENT, TRANSFEMORAL;  Surgeon: Kathleene Hazel, MD;  Location: [facility] OR;  Service: Open Heart Surgery;  Laterality: N/A;   TUMOR REMOVAL      Social History:  Ambulatory *** independently  cane, walker  wheelchair bound, bed bound     reports that she has never smoked. She quit smokeless tobacco use about 21 years ago.  Her smokeless tobacco use included snuff. She reports that she does not drink alcohol and does not use drugs.     Family History: *** Family History  Problem Relation Age of Onset   Heart failure Mother    Other Father    ______________________________________________________________________________________________ Allergies: Allergies  Allergen Reactions   Other Other (See Comments)    NO ACIDIC, TART< OR SPICY FOODS- DEVELOPS REFLUX OFTEN!!  PATIENT HAS TROUBLE SWALLOWING TABLETS!!   Bactrim [Sulfamethoxazole-Trimethoprim] Other (See Comments)    "makes her feel funny" or "unsteady"    Amitriptyline Hcl Rash   Aspirin Hives, Swelling, Rash and Other (See Comments)    Body became swollen   Tape Other (See Comments)    SKIN IS VERY THIN- WILL TEAR EASILY!!   Zetia [Ezetimibe] Rash     Prior to Admission medications   Medication Sig Start Date End Date Taking? Authorizing Provider  acetaminophen (TYLENOL) 650 MG CR tablet Take 650-1,300 mg by mouth 2 (two) times daily as needed for pain.    [provider]  amiodarone (PACERONE) 200 MG tablet Take 1 tablet (200 mg total) by mouth daily. 02/23/22   Camnitz, Andree Coss, MD  apixaban (ELIQUIS) 2.5 MG TABS tablet Take 1 tablet (2.5 mg total) by mouth 2 (two) times daily. 05/20/22   Kathleene Hazel, MD  Carboxymethylcellulose Sodium (EYE DROPS OP) Apply 2 drops to eye daily as needed (gritty eyes).    [provider]  Cholecalciferol (VITAMIN D-3) 25 MCG (1000 UT) CAPS Take 1,000 Units by mouth daily.    [provider]  docusate sodium (COLACE) 100 MG capsule  Take 100 mg by mouth daily.    [provider]  doxycycline (VIBRAMYCIN) 100 MG capsule Take 1 capsule (100 mg total) by mouth 2 (two) times daily. 10/12/22   Arby Barrette, MD  Ensure (ENSURE) Take 237 mLs by mouth every other day.    [provider]  fluticasone (FLOVENT HFA) 220 MCG/ACT inhaler Inhale into the lungs 2 (two) times daily.    [provider]  furosemide (LASIX) 20 MG tablet Take 0.5 tablets (10 mg total) by mouth daily as needed for fluid or edema (for weight gain >5 lbs). 09/07/22   Kathlen Mody, MD  hydrOXYzine (ATARAX) 10 MG tablet Take 1 tablet (10 mg total) by mouth 3 (three) times daily as needed for anxiety. 09/07/22   Kathlen Mody, MD  LINZESS 72 MCG capsule Take 1 capsule (72 mcg total) by mouth daily. 02/10/21   Medina-Vargas, Monina C, NP  loratadine (CLARITIN) 10 MG tablet Take 1 tablet (10 mg total) by mouth daily. 02/10/21   Medina-Vargas, Monina C, NP  nitroGLYCERIN (NITROSTAT) 0.4 MG SL tablet DISSOLVE 1 TABLET UNDER THE TONGUE EVERY 5 MINUTES AS NEEDED FOR CHEST PAIN 11/01/21   Kathleene Hazel, MD  pantoprazole (PROTONIX) 40 MG tablet Take 1 tablet (40 mg total) by mouth 2 (two) times daily. 01/26/22   Ronnald Nian, MD  predniSONE (DELTASONE) 20 MG tablet 2 tabs po daily x 3 days 10/12/22   Arby Barrette, MD  rosuvastatin (CRESTOR) 20 MG tablet TAKE 1 TABLET(20 MG) BY MOUTH DAILY Patient taking differently: Take 20 mg by mouth daily. 07/25/22   Ronnald Nian, MD  Spacer/Aero-Holding Rudean Curt Use as directed 09/14/22   Ronnald Nian, MD  VENTOLIN HFA 108 (90 Base) MCG/ACT inhaler INHALE 2 PUFFS INTO THE LUNGS EVERY 6 HOURS AS NEEDED FOR WHEEZING OR SHORTNESS OF BREATH Patient taking differently: Inhale 2 puffs into the lungs every 6 (six) hours as needed for wheezing or shortness of breath. 07/15/21   Ronnald Nian, MD  Wheat Dextrin (BENEFIBER PO) Take 1 Scoop by mouth daily as needed (fiber).    [provider]     ___________________________________________________________________________________________________ Physical Exam:    10/18/2022    8:15 PM 10/18/2022    8:00 PM 10/18/2022    7:45 PM  Vitals with BMI  Systolic 111 103 99  Diastolic 58 46 45  Pulse 65 69 70     1. General:  in No ***Acute distress***increased work of breathing ***complaining of severe pain****agitated * Chronically ill *well *cachectic *toxic acutely ill -appearing 2. Psychological: Alert and *** Oriented 3. Head/ENT:   Moist *** Dry Mucous Membranes                          Head Non traumatic, neck supple                          Normal *** Poor Dentition 4. SKIN: normal *** decreased Skin turgor,  Skin clean Dry and intact no rash    5. Heart: Regular rate and rhythm no*** Murmur, no Rub or gallop 6. Lungs: ***Clear to auscultation bilaterally, no wheezes or crackles   7. Abdomen: Soft, ***non-tender, Non distended *** obese ***bowel sounds present 8. Lower extremities: no clubbing, cyanosis, no ***edema 9. Neurologically Grossly intact, moving all 4 extremities equally *** strength 5 out of 5 in all 4 extremities cranial nerves II through XII intact 10. MSK: Normal range of motion    Chart has been reviewed  ______________________________________________________________________________________________  Assessment/Plan  89 y.o. female with medical history significant of A.fib on eliquisprior CVA  Sp pacemaker, COPD   Admitted for CVA, UTI, fall  Present on Admission: **None**     No problem-specific Assessment & Plan notes found for this encounter.    Other plan as per orders.  DVT prophylaxis:  SCD     Code Status:    Code Status: Prior FULL CODE *** DNR/DNI ***comfort care as per patient ***family  I had personally discussed CODE STATUS with patient and family* I had spent *min discussing goals of care and CODE STATUS ACP has been reviewed ***   Family Communication:   Family not at   Bedside  plan of care was discussed on the phone with *** Son, Daughter, Wife, Husband, Sister, Brother , father, mother  Diet    Disposition Plan:   *** likely will need placement for rehabilitation                          Back to current facility when stable                            To home once workup is complete and patient is stable  ***Following barriers for discharge:                            Electrolytes corrected  Anemia corrected                             Pain controlled with PO medications                               Afebrile, white count improving able to transition to PO antibiotics                             Will need to be able to tolerate PO                            Will likely need home health, home O2, set up                           Will need consultants to evaluate patient prior to discharge  ****EXPECT DC tomorrow       Consult Orders  (From admission, onward)           Start     Ordered   10/18/22 2113  Consult to hospitalist  Paged by autumn  Once       Provider:  (Not yet assigned)  Question Answer Comment  Place call to: [facility]   Reason for Consult Admit      10/18/22 2112                              ***Would benefit from PT/OT eval prior to DC  Ordered                   Swallow eval - SLP ordered                   Diabetes care coordinator                   Transition of care consulted                   Nutrition    consulted                  Wound care  consulted                   Palliative care    consulted                   Behavioral health  consulted                    Consults called: ***     Admission status:  ED Disposition     ED Disposition  Admit   Condition  --   Comment  The patient appears reasonably stabilized for admission considering the current resources, flow, and capabilities available in the ED at this time, and I doubt any other [facility] requiring further  screening and/or treatment in the ED prior to admission is  present.           Obs***  ***  inpatient     I Expect 2 midnight stay secondary to severity of patient's current illness need for inpatient interventions justified by the following: ***hemodynamic instability despite optimal treatment (tachycardia *hypotension * tachypnea *hypoxia, hypercapnia) * Severe lab/radiological/exam  abnormalities including:     and extensive comorbidities including: *substance abuse  *Chronic pain *DM2  * CHF * CAD  * COPD/asthma *Morbid Obesity * CKD *dementia *liver disease *history of stroke with residual deficits *  malignancy, * sickle cell disease  History of amputation Chronic anticoagulation  That are currently affecting medical management.   I expect  patient to be hospitalized for 2 midnights requiring inpatient medical care.  Patient is at high risk for adverse outcome (such as loss of life or disability) if not treated.  Indication for inpatient stay as follows:  Severe change from baseline regarding mental status Hemodynamic instability despite maximal medical therapy,  ongoing suicidal ideations,  severe pain requiring acute inpatient management,  inability to maintain oral hydration   persistent chest pain despite medical management Need for operative/procedural  intervention New or worsening hypoxia   Need for IV antibiotics, IV fluids, IV rate controling medications, IV antihypertensives, IV pain medications, IV anticoagulation, need for biPAP    Level of care     tele  For  indefinitely please discontinue once patient no longer qualifies COVID-19 Labs      Burnetta Kohls 10/18/2022, 9:50 PM ***  [facility]     after 2 AM please page floor coverage PA If 7AM-7PM, please contact the day team taking care of the patient using [email or web address]

## 2022-10-18 NOTE — Subjective & Objective (Signed)
Lives at home w doughter  Hx of A.fib prior strokes  She had a mechanicl fall today  Not able to get up  Loss balance CT head showed acute cva

## 2022-10-18 NOTE — ED Notes (Signed)
Patient to CT scan

## 2022-10-18 NOTE — ED Triage Notes (Signed)
Pt BIB [facility] from home d/t a fall. Pt was getting up tp use the bedside commode, but lost balance an fell, landed on L side. Pt was on the floor for about an hour per daughter Did not hit her head. C/o hip pain and L shoulder pain. A&O X4.   50 fentanyl given by EMS

## 2022-10-18 NOTE — Progress Notes (Signed)
   10/18/22 2215  Spiritual Encounters  Type of Visit Initial  Care provided to: Pt and family  Referral source Chaplain assessment  Reason for visit Urgent spiritual support  OnCall Visit Yes   Chaplain visited with patient, Shareeka and her daughter, during their stay at the hospital. Corrie Dandy shared that she fell and she tries hard not to be a burden but this time she just could not do it. Her daughter,[name], said that she went to do a few errands and found her mom down when she got home. Dorene Grebe is her mother's caregiver and enjoys their time together. Dorene Grebe said she called EMS right away and the team was helpful and caring of her mother.  Avon knows she is staying overnight and is looking forward to oatmeal for breakfast in the morning.  She hopes to go home tomorrow if everything is ok. She said she was not hurting anymore.   Valerie Roys [facility]  [phone]

## 2022-10-19 ENCOUNTER — Other Ambulatory Visit: Payer: Self-pay

## 2022-10-19 ENCOUNTER — Other Ambulatory Visit (HOSPITAL_COMMUNITY): Payer: Self-pay

## 2022-10-19 DIAGNOSIS — M25512 Pain in left shoulder: Secondary | ICD-10-CM | POA: Diagnosis present

## 2022-10-19 DIAGNOSIS — W19XXXA Unspecified fall, initial encounter: Secondary | ICD-10-CM | POA: Diagnosis not present

## 2022-10-19 DIAGNOSIS — R4781 Slurred speech: Secondary | ICD-10-CM | POA: Diagnosis not present

## 2022-10-19 DIAGNOSIS — J449 Chronic obstructive pulmonary disease, unspecified: Secondary | ICD-10-CM | POA: Diagnosis present

## 2022-10-19 DIAGNOSIS — I5032 Chronic diastolic (congestive) heart failure: Secondary | ICD-10-CM | POA: Diagnosis present

## 2022-10-19 DIAGNOSIS — Z681 Body mass index (BMI) 19 or less, adult: Secondary | ICD-10-CM | POA: Diagnosis not present

## 2022-10-19 DIAGNOSIS — I639 Cerebral infarction, unspecified: Secondary | ICD-10-CM

## 2022-10-19 DIAGNOSIS — G459 Transient cerebral ischemic attack, unspecified: Secondary | ICD-10-CM | POA: Diagnosis not present

## 2022-10-19 DIAGNOSIS — I2510 Atherosclerotic heart disease of native coronary artery without angina pectoris: Secondary | ICD-10-CM | POA: Diagnosis present

## 2022-10-19 DIAGNOSIS — Z993 Dependence on wheelchair: Secondary | ICD-10-CM | POA: Diagnosis not present

## 2022-10-19 DIAGNOSIS — J45909 Unspecified asthma, uncomplicated: Secondary | ICD-10-CM | POA: Diagnosis not present

## 2022-10-19 DIAGNOSIS — N390 Urinary tract infection, site not specified: Secondary | ICD-10-CM | POA: Diagnosis present

## 2022-10-19 DIAGNOSIS — Z85828 Personal history of other malignant neoplasm of skin: Secondary | ICD-10-CM | POA: Diagnosis not present

## 2022-10-19 DIAGNOSIS — E876 Hypokalemia: Secondary | ICD-10-CM | POA: Diagnosis present

## 2022-10-19 DIAGNOSIS — I4819 Other persistent atrial fibrillation: Secondary | ICD-10-CM | POA: Diagnosis present

## 2022-10-19 DIAGNOSIS — Z886 Allergy status to analgesic agent status: Secondary | ICD-10-CM | POA: Diagnosis not present

## 2022-10-19 DIAGNOSIS — Y92009 Unspecified place in unspecified non-institutional (private) residence as the place of occurrence of the external cause: Secondary | ICD-10-CM

## 2022-10-19 DIAGNOSIS — M6281 Muscle weakness (generalized): Secondary | ICD-10-CM | POA: Diagnosis not present

## 2022-10-19 DIAGNOSIS — M6259 Muscle wasting and atrophy, not elsewhere classified, multiple sites: Secondary | ICD-10-CM | POA: Diagnosis not present

## 2022-10-19 DIAGNOSIS — Z951 Presence of aortocoronary bypass graft: Secondary | ICD-10-CM | POA: Diagnosis not present

## 2022-10-19 DIAGNOSIS — Z952 Presence of prosthetic heart valve: Secondary | ICD-10-CM | POA: Diagnosis not present

## 2022-10-19 DIAGNOSIS — I110 Hypertensive heart disease with heart failure: Secondary | ICD-10-CM | POA: Diagnosis present

## 2022-10-19 DIAGNOSIS — L89151 Pressure ulcer of sacral region, stage 1: Secondary | ICD-10-CM | POA: Diagnosis present

## 2022-10-19 DIAGNOSIS — Z8673 Personal history of transient ischemic attack (TIA), and cerebral infarction without residual deficits: Secondary | ICD-10-CM | POA: Diagnosis not present

## 2022-10-19 DIAGNOSIS — R2689 Other abnormalities of gait and mobility: Secondary | ICD-10-CM | POA: Diagnosis not present

## 2022-10-19 DIAGNOSIS — G9389 Other specified disorders of brain: Secondary | ICD-10-CM | POA: Diagnosis present

## 2022-10-19 DIAGNOSIS — I69311 Memory deficit following cerebral infarction: Secondary | ICD-10-CM | POA: Diagnosis not present

## 2022-10-19 DIAGNOSIS — Z66 Do not resuscitate: Secondary | ICD-10-CM | POA: Diagnosis present

## 2022-10-19 DIAGNOSIS — I69828 Other speech and language deficits following other cerebrovascular disease: Secondary | ICD-10-CM | POA: Diagnosis not present

## 2022-10-19 DIAGNOSIS — M797 Fibromyalgia: Secondary | ICD-10-CM | POA: Diagnosis present

## 2022-10-19 DIAGNOSIS — E785 Hyperlipidemia, unspecified: Secondary | ICD-10-CM | POA: Diagnosis present

## 2022-10-19 DIAGNOSIS — W010XXA Fall on same level from slipping, tripping and stumbling without subsequent striking against object, initial encounter: Secondary | ICD-10-CM | POA: Diagnosis present

## 2022-10-19 DIAGNOSIS — Z7401 Bed confinement status: Secondary | ICD-10-CM | POA: Diagnosis not present

## 2022-10-19 DIAGNOSIS — I63511 Cerebral infarction due to unspecified occlusion or stenosis of right middle cerebral artery: Secondary | ICD-10-CM | POA: Diagnosis present

## 2022-10-19 DIAGNOSIS — N3000 Acute cystitis without hematuria: Secondary | ICD-10-CM | POA: Diagnosis not present

## 2022-10-19 DIAGNOSIS — Z79899 Other long term (current) drug therapy: Secondary | ICD-10-CM | POA: Diagnosis not present

## 2022-10-19 DIAGNOSIS — R41841 Cognitive communication deficit: Secondary | ICD-10-CM | POA: Diagnosis not present

## 2022-10-19 DIAGNOSIS — I69391 Dysphagia following cerebral infarction: Secondary | ICD-10-CM | POA: Diagnosis not present

## 2022-10-19 DIAGNOSIS — R4182 Altered mental status, unspecified: Secondary | ICD-10-CM | POA: Diagnosis not present

## 2022-10-19 DIAGNOSIS — L89156 Pressure-induced deep tissue damage of sacral region: Secondary | ICD-10-CM | POA: Diagnosis present

## 2022-10-19 DIAGNOSIS — Z719 Counseling, unspecified: Secondary | ICD-10-CM | POA: Diagnosis not present

## 2022-10-19 DIAGNOSIS — Z741 Need for assistance with personal care: Secondary | ICD-10-CM | POA: Diagnosis not present

## 2022-10-19 DIAGNOSIS — B9620 Unspecified Escherichia coli [E. coli] as the cause of diseases classified elsewhere: Secondary | ICD-10-CM | POA: Diagnosis present

## 2022-10-19 LAB — URINALYSIS, W/ REFLEX TO CULTURE (INFECTION SUSPECTED)
Bilirubin Urine: NEGATIVE
Glucose, UA: NEGATIVE mg/dL
Ketones, ur: NEGATIVE mg/dL
Nitrite: NEGATIVE
Protein, ur: NEGATIVE mg/dL
Specific Gravity, Urine: 1.015 (ref 1.005–1.030)
pH: 5 (ref 5.0–8.0)

## 2022-10-19 LAB — CBC WITH DIFFERENTIAL/PLATELET
Abs Immature Granulocytes: 0.04 10*3/uL (ref 0.00–0.07)
Basophils Absolute: 0 10*3/uL (ref 0.0–0.1)
Basophils Relative: 0 %
Eosinophils Absolute: 0.1 10*3/uL (ref 0.0–0.5)
Eosinophils Relative: 1 %
HCT: 35.6 % — ABNORMAL LOW (ref 36.0–46.0)
Hemoglobin: 11.7 g/dL — ABNORMAL LOW (ref 12.0–15.0)
Immature Granulocytes: 1 %
Lymphocytes Relative: 8 %
Lymphs Abs: 0.7 10*3/uL (ref 0.7–4.0)
MCH: 30.5 pg (ref 26.0–34.0)
MCHC: 32.9 g/dL (ref 30.0–36.0)
MCV: 93 fL (ref 80.0–100.0)
Monocytes Absolute: 0.7 10*3/uL (ref 0.1–1.0)
Monocytes Relative: 9 %
Neutro Abs: 6.4 10*3/uL (ref 1.7–7.7)
Neutrophils Relative %: 81 %
Platelets: 184 10*3/uL (ref 150–400)
RBC: 3.83 MIL/uL — ABNORMAL LOW (ref 3.87–5.11)
RDW: 14.4 % (ref 11.5–15.5)
WBC: 7.8 10*3/uL (ref 4.0–10.5)
nRBC: 0 % (ref 0.0–0.2)

## 2022-10-19 LAB — LIPID PANEL
Cholesterol: 167 mg/dL (ref 0–200)
HDL: 85 mg/dL (ref >40)
LDL Cholesterol: 66 mg/dL (ref 0–99)
Total CHOL/HDL Ratio: 2 RATIO
Triglycerides: 81 mg/dL (ref <150)
VLDL: 16 mg/dL (ref 0–40)

## 2022-10-19 LAB — PROTIME-INR
INR: 1.2 (ref 0.8–1.2)
Prothrombin Time: 15.4 seconds — ABNORMAL HIGH (ref 11.4–15.2)

## 2022-10-19 LAB — BASIC METABOLIC PANEL
Anion gap: 13 (ref 5–15)
BUN: 9 mg/dL (ref 8–23)
CO2: 25 mmol/L (ref 22–32)
Calcium: 8.9 mg/dL (ref 8.9–10.3)
Chloride: 102 mmol/L (ref 98–111)
Creatinine, Ser: 0.49 mg/dL (ref 0.44–1.00)
GFR, Estimated: >60 mL/min (ref >60)
Glucose, Bld: 97 mg/dL (ref 70–99)
Potassium: 3.8 mmol/L (ref 3.5–5.1)
Sodium: 140 mmol/L (ref 135–145)

## 2022-10-19 LAB — LACTIC ACID, PLASMA: Lactic Acid, Venous: 0.9 mmol/L (ref 0.5–1.9)

## 2022-10-19 LAB — FERRITIN: Ferritin: 110 ng/mL (ref 11–307)

## 2022-10-19 LAB — OSMOLALITY: Osmolality: 280 mOsm/kg (ref 275–295)

## 2022-10-19 LAB — CULTURE, BLOOD (ROUTINE X 2): Special Requests: ADEQUATE

## 2022-10-19 LAB — IRON AND TIBC
Iron: 41 ug/dL (ref 28–170)
Saturation Ratios: 17 % (ref 10.4–31.8)
TIBC: 249 ug/dL — ABNORMAL LOW (ref 250–450)
UIBC: 208 ug/dL

## 2022-10-19 LAB — PREALBUMIN: Prealbumin: 21 mg/dL (ref 18–38)

## 2022-10-19 LAB — GLUCOSE, CAPILLARY
Glucose-Capillary: 96 mg/dL (ref 70–99)
Glucose-Capillary: 84 mg/dL (ref 70–99)

## 2022-10-19 LAB — VITAMIN B12: Vitamin B-12: 312 pg/mL (ref 180–914)

## 2022-10-19 LAB — FOLATE: Folate: 7.3 ng/mL (ref >5.9)

## 2022-10-19 MED ORDER — ACETAMINOPHEN 650 MG RE SUPP: 650.0000 mg | RECTAL | Status: DC | PRN

## 2022-10-19 MED ORDER — HYDROXYZINE HCL 10 MG PO TABS: 10.0000 mg | ORAL_TABLET | Freq: Three times a day (TID) | ORAL | Status: DC | PRN

## 2022-10-19 MED ORDER — POTASSIUM CHLORIDE 10 MEQ/100ML IV SOLN
10.0000 meq | INTRAVENOUS | Status: AC
  Administered 2022-10-19 (×3): 10 meq via INTRAVENOUS
  Filled 2022-10-19 (×2): qty 100

## 2022-10-19 MED ORDER — SODIUM CHLORIDE 0.9 % IV SOLN: INTRAVENOUS | Status: DC

## 2022-10-19 MED ORDER — ACETAMINOPHEN 325 MG PO TABS
650.0000 mg | ORAL_TABLET | ORAL | Status: DC | PRN
  Administered 2022-10-19: 650 mg via ORAL
  Filled 2022-10-19: qty 2

## 2022-10-19 MED ORDER — PANTOPRAZOLE SODIUM 40 MG PO TBEC
40.0000 mg | DELAYED_RELEASE_TABLET | Freq: Two times a day (BID) | ORAL | Status: DC
  Administered 2022-10-19 – 2022-10-21 (×5): 40 mg via ORAL
  Filled 2022-10-19 (×5): qty 1

## 2022-10-19 MED ORDER — APIXABAN 2.5 MG PO TABS
2.5000 mg | ORAL_TABLET | Freq: Two times a day (BID) | ORAL | Status: DC
  Administered 2022-10-19 – 2022-10-21 (×5): 2.5 mg via ORAL
  Filled 2022-10-19 (×5): qty 1

## 2022-10-19 MED ORDER — ROSUVASTATIN CALCIUM 20 MG PO TABS
20.0000 mg | ORAL_TABLET | Freq: Every day | ORAL | Status: DC
  Administered 2022-10-19 – 2022-10-21 (×3): 20 mg via ORAL
  Filled 2022-10-19 (×3): qty 1

## 2022-10-19 MED ORDER — STROKE: EARLY STAGES OF RECOVERY BOOK
Freq: Once | Status: AC
  Filled 2022-10-19: qty 1

## 2022-10-19 MED ORDER — ACETAMINOPHEN 160 MG/5ML PO SOLN: 650.0000 mg | ORAL | Status: DC | PRN

## 2022-10-19 MED ORDER — AMIODARONE HCL 200 MG PO TABS
200.0000 mg | ORAL_TABLET | Freq: Every day | ORAL | Status: DC
  Administered 2022-10-19 – 2022-10-21 (×3): 200 mg via ORAL
  Filled 2022-10-19 (×3): qty 1

## 2022-10-19 NOTE — ED Notes (Signed)
ED TO INPATIENT HANDOFF REPORT  ED Nurse Name and Phone #: Marcello Moores [phone]  S Name/Age/Gender [name] 89 y.o. female Room/Bed: 011C/011C  Code Status   Code Status: DNR  Home/SNF/Other Home Patient oriented to: self, place, and situation Is this baseline? Yes   Triage Complete: Triage complete  Chief Complaint CVA (cerebral vascular accident) [facility]) [I63.9]  Triage Note Pt BIB [facility] from home d/t a fall. Pt was getting up tp use the bedside commode, but lost balance an fell, landed on L side. Pt was on the floor for about an hour per daughter Did not hit her head. C/o hip pain and L shoulder pain. A&O X4.   50 fentanyl given by EMS   Allergies Allergies  Allergen Reactions   Other Other (See Comments)    NO ACIDIC, TART< OR SPICY FOODS- DEVELOPS REFLUX OFTEN!!  PATIENT HAS TROUBLE SWALLOWING TABLETS!!   Bactrim [Sulfamethoxazole-Trimethoprim] Other (See Comments)    "makes her feel funny" or "unsteady"    Amitriptyline Hcl Rash   Aspirin Hives, Swelling, Rash and Other (See Comments)    Body became swollen   Tape Other (See Comments)    SKIN IS VERY THIN- WILL TEAR EASILY!!   Zetia [Ezetimibe] Rash    Level of Care/Admitting Diagnosis ED Disposition     ED Disposition  Admit   Condition  --   Comment  Hospital Area: [facility] [[id number]]  Level of Care: Telemetry Medical [104]  May place patient in observation at [facility] or [facility] if equivalent level of care is available:: No  Covid Evaluation: Asymptomatic - no recent exposure (last 10 days) testing not required  Diagnosis: CVA (cerebral vascular accident) [facility]) [[id number]]  Admitting Physician: Therisa Doyne [[id number]]  Attending Physician: Therisa Doyne [[id number]]          B Medical/Surgery History Past Medical History:  Diagnosis Date   Anxiety    Aortic Stenosis s/p TAVR    Echo 10/21: EF 55-60, no RWMA, mild asymmetric LVH, normal RVSF, RVSP 40.2 mmHg, mild MR,  s/p TAVR with mean gradient 8.63mmHg, no PVL   Arthritis    OSTEO   Aspirin allergy    on Plavix   Asthma    CAD (coronary artery disease)    a. s/p CABG 2006 with Cox-Maze procedure.   Carotid artery disease (HCC)    a. s/p R CEA.   Diastolic dysfunction    Eczema    Fibromyalgia    GERD (gastroesophageal reflux disease)    Hiatal hernia    Hyperlipidemia    Hypertension    Kyphoscoliosis    Mitral regurgitation    PAF (paroxysmal atrial fibrillation) (HCC)    a. pt has h/o hematuria on Eliquis and has since refused anticoagulation   Pneumonia 2015 ?   Pulmonary regurgitation    Skin cancer of arm    Stroke [facility])    Tricuspid regurgitation    Past Surgical History:  Procedure Laterality Date   ABDOMINAL HYSTERECTOMY  1976   CAROTID ENDARTERECTOMY  2010   CORONARY ANGIOPLASTY WITH STENT PLACEMENT     CORONARY ARTERY BYPASS GRAFT  01/2005   LIMA-D1; SVG-LAD; SVG-OM; SVG-PDA   ERCP N/A 12/28/2021   Procedure: ENDOSCOPIC RETROGRADE CHOLANGIOPANCREATOGRAPHY (ERCP);  Surgeon: Jeani Hawking, MD;  Location: [facility] ENDOSCOPY;  Service: Gastroenterology;  Laterality: N/A;   ESOPHAGOGASTRODUODENOSCOPY (EGD) WITH PROPOFOL N/A 03/19/2021   Procedure: ESOPHAGOGASTRODUODENOSCOPY (EGD) WITH PROPOFOL;  Surgeon: Jeani Hawking, MD;  Location: [facility] ENDOSCOPY;  Service:  Endoscopy;  Laterality: N/A;   EYE SURGERY     MULTIPLE EXTRACTIONS WITH ALVEOLOPLASTY  12/28/2016   Extraction of tooth #'s 2- 5,7-10, 12,13,17-20,and 22-29 with alveoloplasty and maxillary right and left lateral exostoses reductions   MULTIPLE EXTRACTIONS WITH ALVEOLOPLASTY N/A 12/28/2016   Procedure: Extraction of tooth #'s 2- 5,7-10, 12,13,17-20,and 22-29 with alveoloplasty and maxillary right and left lateral exostoses reductions;  Surgeon: Charlynne Pander, DDS;  Location: [facility] OR;  Service: Oral Surgery;  Laterality: N/A;   PACEMAKER IMPLANT N/A 01/15/2021   Procedure: PACEMAKER IMPLANT;  Surgeon: Regan Lemming, MD;   Location: [facility] INVASIVE CV LAB;  Service: Cardiovascular;  Laterality: N/A;   REMOVAL OF STONES  12/28/2021   Procedure: REMOVAL OF STONES;  Surgeon: Jeani Hawking, MD;  Location: [facility] ENDOSCOPY;  Service: Gastroenterology;;   RIGHT/LEFT HEART CATH AND CORONARY/GRAFT ANGIOGRAPHY N/A 10/27/2016   Procedure: Right/Left Heart Cath and Coronary/Graft Angiography;  Surgeon: Kathleene Hazel, MD;  Location: [facility] INVASIVE CV LAB;  Service: Cardiovascular;  Laterality: N/A;   SAVORY DILATION N/A 03/19/2021   Procedure: SAVORY DILATION;  Surgeon: Jeani Hawking, MD;  Location: [facility] ENDOSCOPY;  Service: Endoscopy;  Laterality: N/A;   SPHINCTEROTOMY  12/28/2021   Procedure: SPHINCTEROTOMY;  Surgeon: Jeani Hawking, MD;  Location: [facility] ENDOSCOPY;  Service: Gastroenterology;;   TEE WITHOUT CARDIOVERSION N/A 01/03/2017   Procedure: TRANSESOPHAGEAL ECHOCARDIOGRAM (TEE);  Surgeon: Kathleene Hazel, MD;  Location: [facility] OR;  Service: Open Heart Surgery;  Laterality: N/A;   TONSILLECTOMY     TRANSCATHETER AORTIC VALVE REPLACEMENT, TRANSFEMORAL N/A 01/03/2017   Procedure: TRANSCATHETER AORTIC VALVE REPLACEMENT, TRANSFEMORAL;  Surgeon: Kathleene Hazel, MD;  Location: [facility] OR;  Service: Open Heart Surgery;  Laterality: N/A;   TUMOR REMOVAL       A IV Location/Drains/Wounds Patient Lines/Drains/Airways Status     Active Line/Drains/Airways     Name Placement date Placement time Site Days   Peripheral IV 10/12/22 20 G Anterior;Right Forearm 10/12/22  1733  Forearm  7   Pressure Injury 04/22/20 Sacrum Medial Deep Tissue Pressure Injury - Purple or maroon localized area of discolored intact skin or blood-filled blister due to damage of underlying soft tissue from pressure and/or shear. 04/22/20  2230  -- 910   Pressure Injury 02/19/22 Coccyx Medial;Posterior Stage 1 -  Intact skin with non-blanchable redness of a localized area usually over a bony prominence. 02/19/22  0030  -- 242   Wound / Incision (Open or  Dehisced) 10/18/22 Other (Comment) Knee Anterior;Left reddened , bruised area to lateral knee 10/18/22  1930  Knee  1            Intake/Output Last 24 hours  Intake/Output Summary (Last 24 hours) at 10/19/2022 0707 Last data filed at 10/19/2022 1610 Gross per 24 hour  Intake 202.55 ml  Output --  Net 202.55 ml    Labs/Imaging Results for orders placed or performed during the hospital encounter of 10/18/22 (from the past 48 hour(s))  CBG monitoring, ED     Status: Abnormal   Collection Time: 10/18/22  5:52 PM  Result Value Ref Range   Glucose-Capillary 182 (H) 70 - 99 mg/dL    Comment: Glucose reference range applies only to samples taken after fasting for at least 8 hours.  CBC WITH DIFFERENTIAL     Status: Abnormal   Collection Time: 10/18/22  7:00 PM  Result Value Ref Range   WBC 17.8 (H) 4.0 - 10.5 K/uL   RBC 3.98 3.87 - 5.11 MIL/uL  Hemoglobin 12.1 12.0 - 15.0 g/dL   HCT 16.1 09.6 - 04.5 %   MCV 95.0 80.0 - 100.0 fL   MCH 30.4 26.0 - 34.0 pg   MCHC 32.0 30.0 - 36.0 g/dL   RDW 40.9 81.1 - 91.4 %   Platelets 208 150 - 400 K/uL   nRBC 0.0 0.0 - 0.2 %   Neutrophils Relative % 89 %   Neutro Abs 16.0 (H) 1.7 - 7.7 K/uL   Lymphocytes Relative 3 %   Lymphs Abs 0.5 (L) 0.7 - 4.0 K/uL   Monocytes Relative 7 %   Monocytes Absolute 1.2 (H) 0.1 - 1.0 K/uL   Eosinophils Relative 0 %   Eosinophils Absolute 0.0 0.0 - 0.5 K/uL   Basophils Relative 0 %   Basophils Absolute 0.0 0.0 - 0.1 K/uL   Immature Granulocytes 1 %   Abs Immature Granulocytes 0.08 (H) 0.00 - 0.07 K/uL    Comment: Performed at [facility], [address] [place], [place] [address]  Comprehensive metabolic panel     Status: Abnormal   Collection Time: 10/18/22  7:00 PM  Result Value Ref Range   Sodium 135 135 - 145 mmol/L   Potassium 3.2 (L) 3.5 - 5.1 mmol/L   Chloride 95 (L) 98 - 111 mmol/L   CO2 27 22 - 32 mmol/L   Glucose, Bld 157 (H) 70 - 99 mg/dL    Comment: Glucose reference range applies  only to samples taken after fasting for at least 8 hours.   BUN 15 8 - 23 mg/dL   Creatinine, Ser 6.21 0.44 - 1.00 mg/dL   Calcium 8.9 8.9 - 30.8 mg/dL   Total Protein 5.6 (L) 6.5 - 8.1 g/dL   Albumin 3.2 (L) 3.5 - 5.0 g/dL   AST 32 15 - 41 U/L   ALT 26 0 - 44 U/L   Alkaline Phosphatase 37 (L) 38 - 126 U/L   Total Bilirubin 0.6 0.3 - 1.2 mg/dL   GFR, Estimated >65 >78 mL/min    Comment: (NOTE) Calculated using the CKD-EPI Creatinine Equation (2021)    Anion gap 13 5 - 15    Comment: Performed at [facility], [address] [place], [place] [address]  CK     Status: Abnormal   Collection Time: 10/18/22  7:00 PM  Result Value Ref Range   Total CK 357 (H) 38 - 234 U/L    Comment: Performed at [facility], [address] [place], [place] [address]  TSH     Status: None   Collection Time: 10/18/22  7:00 PM  Result Value Ref Range   TSH 2.332 0.350 - 4.500 uIU/mL    Comment: Performed by a 3rd Generation assay with a functional sensitivity of <=0.01 uIU/mL. Performed at [facility], [address], [place], [place] [address]   Reticulocytes     Status: Abnormal   Collection Time: 10/18/22  7:00 PM  Result Value Ref Range   Retic Ct Pct 1.3 0.4 - 3.1 %   RBC. 4.07 3.87 - 5.11 MIL/uL   Retic Count, Absolute 53.3 19.0 - 186.0 K/uL   Immature Retic Fract 1.6 (L) 2.3 - 15.9 %    Comment: Performed at [facility], [address] [place], [place] [address]  Urinalysis, Routine w reflex microscopic -Urine, Clean Catch     Status: Abnormal   Collection Time: 10/18/22  8:21 PM  Result Value Ref Range  Color, Urine YELLOW YELLOW   APPearance HAZY (A) CLEAR   Specific Gravity, Urine 1.012 1.005 - 1.030   pH 5.0 5.0 - 8.0   Glucose, UA 50 (A) NEGATIVE mg/dL   Hgb urine dipstick SMALL (A) NEGATIVE   Bilirubin Urine NEGATIVE NEGATIVE   Ketones, ur NEGATIVE NEGATIVE mg/dL   Protein, ur NEGATIVE NEGATIVE mg/dL   Nitrite NEGATIVE NEGATIVE   Leukocytes,Ua TRACE  (A) NEGATIVE   RBC / HPF 0-5 0 - 5 RBC/hpf   WBC, UA 11-20 0 - 5 WBC/hpf   Bacteria, UA MANY (A) NONE SEEN   Squamous Epithelial / HPF 0-5 0 - 5 /HPF    Comment: Performed at [facility], [address] [place], [place] [address]  Creatinine, urine, random     Status: None   Collection Time: 10/18/22  8:21 PM  Result Value Ref Range   Creatinine, Urine 30 mg/dL    Comment: Performed at [facility], [address], [place], [place] [address]  Osmolality, urine     Status: None   Collection Time: 10/18/22  8:21 PM  Result Value Ref Range   Osmolality, Ur 459 300 - 900 mOsm/kg    Comment: Performed at [facility], [address] [place], [place] [address]  Sodium, urine, random     Status: None   Collection Time: 10/18/22  8:21 PM  Result Value Ref Range   Sodium, Ur 85 mmol/L    Comment: Performed at [facility], [address] [place], [place] [address]  Lactic acid, plasma     Status: None   Collection Time: 10/18/22  9:30 PM  Result Value Ref Range   Lactic Acid, Venous 1.2 0.5 - 1.9 mmol/L    Comment: Performed at [facility], [address], [place], [place] [address]  Lactic acid, plasma     Status: None   Collection Time: 10/18/22 11:35 PM  Result Value Ref Range   Lactic Acid, Venous 0.9 0.5 - 1.9 mmol/L    Comment: Performed at [facility], [address] [place], [place] [address]  Osmolality     Status: None   Collection Time: 10/18/22 11:35 PM  Result Value Ref Range   Osmolality 280 275 - 295 mOsm/kg    Comment: Performed at [facility], [address] [place], [place] [address]  Protime-INR     Status: Abnormal   Collection Time: 10/18/22 11:35 PM  Result Value Ref Range   Prothrombin Time 15.4 (H) 11.4 - 15.2 seconds   INR 1.2 0.8 - 1.2    Comment: (NOTE) INR goal varies based on device and disease states. Performed at [facility], [address] [place], [place] [address]   Vitamin B12      Status: None   Collection Time: 10/18/22 11:35 PM  Result Value Ref Range   Vitamin B-12 312 180 - 914 pg/mL    Comment: (NOTE) This assay is not validated for testing neonatal or myeloproliferative syndrome specimens for Vitamin B12 levels. Performed at [facility], [address], [place], [place] [address]   Folate     Status: None   Collection Time: 10/18/22 11:35 PM  Result Value Ref Range   Folate 7.3 >5.9 ng/mL    Comment: Performed at [facility], [address], [place], [place] [address]  Iron and TIBC     Status: Abnormal   Collection  Time: 10/18/22 11:35 PM  Result Value Ref Range   Iron 41 28 - 170 ug/dL   TIBC 409 (L) 811 - 914 ug/dL   Saturation Ratios 17 10.4 - 31.8 %   UIBC 208 ug/dL    Comment: Performed at [facility], [address] [place], [place] [address]  Ferritin     Status: None   Collection Time: 10/18/22 11:35 PM  Result Value Ref Range   Ferritin 110 11 - 307 ng/mL    Comment: Performed at [facility], [address] [place], [place] [address]  Prealbumin     Status: None   Collection Time: 10/19/22  5:50 AM  Result Value Ref Range   Prealbumin 21 18 - 38 mg/dL    Comment: Performed at [facility], [address] [place], [place] [address]   CT ANGIO HEAD NECK W WO CM  Result Date: 10/19/2022 CLINICAL DATA:  Fall, possible subacute infarct on same day CT head EXAM: CT ANGIOGRAPHY HEAD AND NECK WITH AND WITHOUT CONTRAST TECHNIQUE: Multidetector CT imaging of the head and neck was performed using the standard protocol during bolus administration of intravenous contrast. Multiplanar CT image reconstructions and MIPs were obtained to evaluate the vascular anatomy. Carotid stenosis measurements (when applicable) are obtained utilizing NASCET criteria, using the distal internal carotid diameter as the denominator. RADIATION DOSE REDUCTION: This exam was performed according to the departmental dose-optimization program  which includes automated exposure control, adjustment of the mA and/or kV according to patient size and/or use of iterative reconstruction technique. CONTRAST:  75mL OMNIPAQUE IOHEXOL 350 MG/ML SOLN COMPARISON:  10/18/2022 CT head FINDINGS: CT HEAD FINDINGS Brain: Redemonstrated ill-defined hypodensity in the right posterior MCA territory. Hypodensity is also noted in the left cerebellum (series 5, image 8). No evidence of acute hemorrhage, mass, mass effect, or midline shift. No hydrocephalus or extra-axial fluid collection. Periventricular white matter changes, likely the sequela of chronic small vessel ischemic disease. Vascular: No hyperdense vessel. Atherosclerotic calcifications in the intracranial carotid and vertebral arteries. Skull: Negative for fracture or focal lesion. Sinuses/Orbits: Mucous retention cysts in the maxillary sinuses. Status post bilateral lens replacements. Other: The mastoid air cells are well aerated. CTA NECK FINDINGS Evaluation is somewhat limited by motion artifact. Aortic arch: Standard branching. Imaged portion shows no evidence of aneurysm or dissection. Aortic atherosclerosis, which extends into the arch vessels and causes 80% stenosis at the origin of the left subclavian artery (series 11, image 306). Right carotid system: No evidence of dissection, occlusion, or hemodynamically significant stenosis (greater than 50%), although evaluation of the mid to distal right ICA is significantly motion limited. Left carotid system: No evidence of dissection, occlusion, or hemodynamically significant stenosis (greater than 50%). Vertebral arteries: No evidence of dissection, occlusion, or hemodynamically significant stenosis (greater than 50%). Skeleton: No acute osseous abnormality. Degenerative changes in the cervical spine. Edentulous. Other neck: Hypoenhancing lesions in the thyroid, which measure up to 8 mm, for which no follow-up is currently indicated. (Reference: J Am Coll Radiol.  2015 Feb;12(2): 143-50) Upper chest: No focal pulmonary opacity or pleural effusion. Right greater than left apical pleuroparenchymal scarring. Emphysema. No pleural effusion. Review of the MIP images confirms the above findings CTA HEAD FINDINGS Anterior circulation: Both internal carotid arteries are patent to the termini, with severe stenosis in the right cavernous segment and left supraclinoid segment, moderate stenosis in the right supraclinoid segment, and mild stenosis in the left cavernous segment. A1 segments patent. Normal  anterior communicating artery. Anterior cerebral arteries are patent to their distal aspects without significant stenosis. No M1 stenosis or occlusion. MCA branches perfused to their distal aspects without significant stenosis, although there is somewhat diminished opacification of the posterior MCA branches on the right compared to the left (series 14, image 18). Posterior circulation: Vertebral arteries patent to the vertebrobasilar junction without significant stenosis. Posterior inferior cerebellar arteries patent proximally. Basilar patent to its distal aspect without significant stenosis. Superior cerebellar arteries patent proximally. Patent P1 segments. PCAs perfused to their distal aspects without significant stenosis. The left posterior communicating artery is patent. Venous sinuses: Not well opacified due to phase of contrast. Anatomic variants: None significant. Review of the MIP images confirms the above findings IMPRESSION: 1. Redemonstrated hypodensity in the right posterior MCA territory, which remains concerning for subacute infarct. An MRI is recommended for further evaluation. 2. Hypodensity in the left cerebellum, which could represent an additional subacute infarct. 3. No intracranial large vessel occlusion. Somewhat diminished opacification of the posterior MCA branches on the right compared to the left without focal stenosis. 4. Severe stenosis in the bilateral  cavernous and supraclinoid ICAs, with moderate stenosis in the right supraclinoid segment and mild stenosis in the left cavernous segment. 5. 80% stenosis at the origin the left subclavian artery. No other hemodynamically significant stenosis in the neck, although evaluation of the mid to distal right ICA is limited by motion artifact. 6. Aortic Atherosclerosis (ICD10-I70.0) and Emphysema (ICD10-J43.9). Electronically Signed   By: Wiliam Ke M.D.   On: 10/19/2022 00:19   DG Tibia/Fibula Left  Result Date: 10/18/2022 CLINICAL DATA:  Recent fall with left lower leg pain, initial encounter EXAM: LEFT TIBIA AND FIBULA - 2 VIEW COMPARISON:  None Available. FINDINGS: No acute fracture in the tibia or fibula is noted. No soft tissue abnormality is seen. IMPRESSION: No acute abnormality noted. Electronically Signed   By: Alcide Clever M.D.   On: 10/18/2022 22:28   CT HEAD WO CONTRAST  Result Date: 10/18/2022 CLINICAL DATA:  Head trauma, minor (Age >= 65y); Neck trauma (Age >= 65y). History of prior strokes EXAM: CT HEAD WITHOUT CONTRAST CT CERVICAL SPINE WITHOUT CONTRAST TECHNIQUE: Multidetector CT imaging of the head and cervical spine was performed following the standard protocol without intravenous contrast. Multiplanar CT image reconstructions of the cervical spine were also generated. RADIATION DOSE REDUCTION: This exam was performed according to the departmental dose-optimization program which includes automated exposure control, adjustment of the mA and/or kV according to patient size and/or use of iterative reconstruction technique. COMPARISON:  CT angiography chest 10/12/2022 FINDINGS: CT HEAD FINDINGS Brain: Cerebral ventricle sizes are concordant with the degree of cerebral volume loss. Patchy and confluent areas of decreased attenuation are noted throughout the deep and periventricular white matter of the cerebral hemispheres bilaterally, compatible with chronic microvascular ischemic disease. Right  parietotemporal and occipital lobe loss of gray-white matter differentiation. No parenchymal hemorrhage. No mass lesion. No extra-axial collection. No mass effect or midline shift. No hydrocephalus. Basilar cisterns are patent. Vascular: No hyperdense vessel. Atherosclerotic calcifications are present within the cavernous internal carotid and vertebral arteries. Skull: No acute fracture or focal lesion. Sinuses/Orbits: Paranasal sinuses and mastoid air cells are clear. Right lens replacement. Otherwise the orbits are unremarkable. Other: None. CT CERVICAL SPINE FINDINGS Alignment: Normal. Skull base and vertebrae: Multilevel moderate degenerative changes of the spine. Associated posterior disc osteophyte complex formation at the C3-C4 and C5-C6 levels. Associated multilevel moderate osseous neural foraminal stenosis. No acute fracture.  No aggressive appearing focal osseous lesion or focal pathologic process. Soft tissues and spinal canal: No prevertebral fluid or swelling. No visible canal hematoma. Upper chest: Biapical pleural/pulmonary scarring. Emphysematous changes. Stable chronic 8 mm nodule like left upper lobe pulmonary nodule. Other: Severe atherosclerotic plaque of the aortic arch and its branches. Partially visualized cardiac pacemaker leads. IMPRESSION: 1. Likely subacute right parietotemporal and occipital lobe infarction. 2. No acute intracranial hemorrhage. 3. No acute displaced fracture or traumatic listhesis of the cervical spine. 4. Aortic Atherosclerosis (ICD10-I70.0) and Emphysema (ICD10-J43.9). These results were called by telephone at the time of interpretation on 10/18/2022 at 8:55 pm to provider [facility] , who verbally acknowledged these results. Electronically Signed   By: Tish Frederickson M.D.   On: 10/18/2022 20:56   CT CERVICAL SPINE WO CONTRAST  Result Date: 10/18/2022 CLINICAL DATA:  Head trauma, minor (Age >= 65y); Neck trauma (Age >= 65y). History of prior strokes EXAM: CT  HEAD WITHOUT CONTRAST CT CERVICAL SPINE WITHOUT CONTRAST TECHNIQUE: Multidetector CT imaging of the head and cervical spine was performed following the standard protocol without intravenous contrast. Multiplanar CT image reconstructions of the cervical spine were also generated. RADIATION DOSE REDUCTION: This exam was performed according to the departmental dose-optimization program which includes automated exposure control, adjustment of the mA and/or kV according to patient size and/or use of iterative reconstruction technique. COMPARISON:  CT angiography chest 10/12/2022 FINDINGS: CT HEAD FINDINGS Brain: Cerebral ventricle sizes are concordant with the degree of cerebral volume loss. Patchy and confluent areas of decreased attenuation are noted throughout the deep and periventricular white matter of the cerebral hemispheres bilaterally, compatible with chronic microvascular ischemic disease. Right parietotemporal and occipital lobe loss of gray-white matter differentiation. No parenchymal hemorrhage. No mass lesion. No extra-axial collection. No mass effect or midline shift. No hydrocephalus. Basilar cisterns are patent. Vascular: No hyperdense vessel. Atherosclerotic calcifications are present within the cavernous internal carotid and vertebral arteries. Skull: No acute fracture or focal lesion. Sinuses/Orbits: Paranasal sinuses and mastoid air cells are clear. Right lens replacement. Otherwise the orbits are unremarkable. Other: None. CT CERVICAL SPINE FINDINGS Alignment: Normal. Skull base and vertebrae: Multilevel moderate degenerative changes of the spine. Associated posterior disc osteophyte complex formation at the C3-C4 and C5-C6 levels. Associated multilevel moderate osseous neural foraminal stenosis. No acute fracture. No aggressive appearing focal osseous lesion or focal pathologic process. Soft tissues and spinal canal: No prevertebral fluid or swelling. No visible canal hematoma. Upper chest:  Biapical pleural/pulmonary scarring. Emphysematous changes. Stable chronic 8 mm nodule like left upper lobe pulmonary nodule. Other: Severe atherosclerotic plaque of the aortic arch and its branches. Partially visualized cardiac pacemaker leads. IMPRESSION: 1. Likely subacute right parietotemporal and occipital lobe infarction. 2. No acute intracranial hemorrhage. 3. No acute displaced fracture or traumatic listhesis of the cervical spine. 4. Aortic Atherosclerosis (ICD10-I70.0) and Emphysema (ICD10-J43.9). These results were called by telephone at the time of interpretation on 10/18/2022 at 8:55 pm to provider [facility] , who verbally acknowledged these results. Electronically Signed   By: Tish Frederickson M.D.   On: 10/18/2022 20:56   DG Chest 2 View  Result Date: 10/18/2022 CLINICAL DATA:  fall EXAM: CHEST - 2 VIEW COMPARISON:  Chest x-ray 10/12/2022, ct chest 10/12/22, cxr 08/22/22 FINDINGS: Left chest wall dual lead pacemaker in grossly appropriate position. The heart and mediastinal contours are within normal limits. Status post aortic valve replacement. Surgical changes overlie the mediastinum. Aortic calcification. Right apical pleural/pulmonary scarring. Stable calcification along the  right lower lobe. No focal consolidation. No pulmonary edema. No pleural effusion. No pneumothorax. No acute osseous abnormality. IMPRESSION: 1. No active cardiopulmonary disease. 2.  Aortic Atherosclerosis (ICD10-I70.0). Electronically Signed   By: Tish Frederickson M.D.   On: 10/18/2022 18:45   DG Shoulder Left  Result Date: 10/18/2022 CLINICAL DATA:  Recent fall with left shoulder pain, initial encounter EXAM: LEFT SHOULDER - 2+ VIEW COMPARISON:  None Available. FINDINGS: Mild degenerative changes of the acromioclavicular joint are seen. No acute fracture or dislocation is noted. The underlying bony thorax appears within normal limits. Pacemaker is seen. IMPRESSION: No acute abnormality noted. Electronically Signed    By: Alcide Clever M.D.   On: 10/18/2022 18:44   DG Hip Unilat W or Wo Pelvis 2-3 Views Left  Result Date: 10/18/2022 CLINICAL DATA:  Recent fall with left hip pain, initial encounter EXAM: DG HIP (WITH OR WITHOUT PELVIS) 3V LEFT COMPARISON:  None Available. FINDINGS: Pelvic ring appears intact. No acute fracture or dislocation is noted. No soft tissue abnormality is seen. IMPRESSION: No acute abnormality noted. Electronically Signed   By: Alcide Clever M.D.   On: 10/18/2022 18:38   CUP PACEART REMOTE DEVICE CHECK  Result Date: 10/18/2022 Scheduled remote reviewed. Normal device function.  Known PAF, currently in progress from 5/27, controlled rates Eliquis per PA report Next remote 91 days. LA, CVRS   Pending Labs Unresulted Labs (From admission, onward)     Start     Ordered   10/18/22 2115  Blood culture (routine x 2)  BLOOD CULTURE X 2,   R (with STAT occurrences)      10/18/22 2114   10/18/22 2114  Urine Culture  Once,   URGENT       Question:  Indication  Answer:  Altered mental status (if no other cause identified)   10/18/22 2114   Signed and Held  Lipid panel  (Labs)  Tomorrow morning,   R       Comments: Fasting    Signed and Held   Signed and Held  Hemoglobin A1c  (Labs)  Once,   R       Comments: To assess prior glycemic control    Signed and Held            Vitals/Pain Today's Vitals   10/19/22 0215 10/19/22 0230 10/19/22 0245 10/19/22 0325  BP: (!) 134/50 (!) 130/56 (!) 123/58   Pulse: (!) 43 (!) 42 71   Resp: 17  18   Temp: 98.2 F (36.8 C)     TempSrc: Oral     SpO2: 100% 99% 99%   PainSc:    0-No pain    Isolation Precautions No active isolations  Medications Medications  0.9 %  sodium chloride infusion ( Intravenous New Bag/Given 10/19/22 0548)  cefTRIAXone (ROCEPHIN) 1 g in sodium chloride 0.9 % 100 mL IVPB (has no administration in time range)  potassium chloride 10 mEq in 100 mL IVPB (10 mEq Intravenous New Bag/Given 10/19/22 0552)  cefTRIAXone  (ROCEPHIN) 1 g in sodium chloride 0.9 % 100 mL IVPB (0 g Intravenous Stopped 10/18/22 2220)  iohexol (OMNIPAQUE) 350 MG/ML injection 75 mL (75 mLs Intravenous Contrast Given 10/18/22 2356)    Mobility walks with device     Focused Assessments   R Recommendations: See Admitting Provider Note  Report given to:   Additional Notes:

## 2022-10-19 NOTE — ED Notes (Signed)
Main Pharmacy called to reschedule IV Potassium because patient is requiring a slower rate at 98ml/hr due to burning, Pharmacist to reschedule doses and rate.

## 2022-10-19 NOTE — Consult Note (Signed)
Neurology Consultation  Reason for Consult: Fall, abnormal CT head Referring Physician: Dr. Particia Nearing  CC: Falls, abnormal brain imaging  History is obtained from: Chart, patient, patient's daughter at bedside  HPI: [name] is a 89 y.o. female With a past medical history of aortic stenosis status post TAVR, coronary artery disease status post CABG, carotid artery disease status post right CEA, hypertension, hyperlipidemia, paroxysmal atrial fibrillation on Eliquis, prior strokes with unclear deficits presented to the emergency room for evaluation after a fall.  Complaining of left shoulder pain and some left arm weakness after the fall.  The daughter said that she has had multiple falls over the past few weeks.  She was brought in by EMS because she was not able to get up by herself.  Head CT was done which showed a question of subacute stroke in the right MCA territory which prompted a neurological consultation.  Has a pacemaker-MRI cannot be done till the morning. The patient's daughter does not think that she is any weaker on the left than usual-some of the weakness of the left arm the family has attributed to the pain from after the fall and hitting the shoulder. Urinalysis was also concerning for some bacteriuria Patient has had trouble with her short-term memory for the past few years. She lives at home with the daughter who takes care of her full-time.  On review of systems, daughter reports that she has had trouble with swallowing and is working with speech therapy, she is also had dizziness which she describes as lightheadedness over the past few weeks to months and has had multiple falls.  Daughter reports that the patient has seen [facility] neurology in the past but I cannot find any notes.  She had 1 printout of FLAIR images which had increased T2/flair signal in the right temporoparietal area-unclear date of the scan.  She seems to have think they have seen Dr. Terrace Arabia in the past, but  I have no notes or records from [facility]  Patient is a poor historian.  Daughter also is not able to provide me any solid details on prior strokes and I am unable to locate any outpatient neurology records  LKW: Sometime prior to the fall this afternoon IV thrombolysis given?: no, unclear last known well Premorbid modified Rankin scale (mRS): 3-4   ROS: Full ROS was performed and is negative except as noted in the HPI.   Past Medical History:  Diagnosis Date   Anxiety    Aortic Stenosis s/p TAVR    Echo 10/21: EF 55-60, no RWMA, mild asymmetric LVH, normal RVSF, RVSP 40.2 mmHg, mild MR, s/p TAVR with mean gradient 8.31mmHg, no PVL   Arthritis    OSTEO   Aspirin allergy    on Plavix   Asthma    CAD (coronary artery disease)    a. s/p CABG 2006 with Cox-Maze procedure.   Carotid artery disease (HCC)    a. s/p R CEA.   Diastolic dysfunction    Eczema    Fibromyalgia    GERD (gastroesophageal reflux disease)    Hiatal hernia    Hyperlipidemia    Hypertension    Kyphoscoliosis    Mitral regurgitation    PAF (paroxysmal atrial fibrillation) (HCC)    a. pt has h/o hematuria on Eliquis and has since refused anticoagulation   Pneumonia 2015 ?   Pulmonary regurgitation    Skin cancer of arm    Stroke [facility])    Tricuspid regurgitation  Family History  Problem Relation Age of Onset   Heart failure Mother    Other Father      Social History:   reports that she has never smoked. She quit smokeless tobacco use about 21 years ago.  Her smokeless tobacco use included snuff. She reports that she does not drink alcohol and does not use drugs.  Medications  Current Facility-Administered Medications:    0.9 %  sodium chloride infusion, , Intravenous, Continuous, Jacalyn Lefevre, MD, Last Rate: 125 mL/hr at 10/18/22 2118, New Bag at 10/18/22 2118   cefTRIAXone (ROCEPHIN) 1 g in sodium chloride 0.9 % 100 mL IVPB, 1 g, Intravenous, Q24H, Doutova, Anastassia, MD   potassium chloride 10  mEq in 100 mL IVPB, 10 mEq, Intravenous, Q2H, Doutova, Anastassia, MD  Current Outpatient Medications:    acetaminophen (TYLENOL) 650 MG CR tablet, Take 650-1,300 mg by mouth 2 (two) times daily as needed for pain., Disp: , Rfl:    amiodarone (PACERONE) 200 MG tablet, Take 1 tablet (200 mg total) by mouth daily., Disp: 90 tablet, Rfl: 3   apixaban (ELIQUIS) 2.5 MG TABS tablet, Take 1 tablet (2.5 mg total) by mouth 2 (two) times daily., Disp: 60 tablet, Rfl: 5   Carboxymethylcellulose Sodium (EYE DROPS OP), Apply 2 drops to eye daily as needed (gritty eyes)., Disp: , Rfl:    Cholecalciferol (VITAMIN D-3) 25 MCG (1000 UT) CAPS, Take 1,000 Units by mouth daily., Disp: , Rfl:    docusate sodium (COLACE) 100 MG capsule, Take 100 mg by mouth daily., Disp: , Rfl:    doxycycline (VIBRAMYCIN) 100 MG capsule, Take 1 capsule (100 mg total) by mouth 2 (two) times daily., Disp: 14 capsule, Rfl: 0   Ensure (ENSURE), Take 237 mLs by mouth every other day., Disp: , Rfl:    fluticasone (FLOVENT HFA) 220 MCG/ACT inhaler, Inhale into the lungs 2 (two) times daily., Disp: , Rfl:    hydrOXYzine (ATARAX) 10 MG tablet, Take 1 tablet (10 mg total) by mouth 3 (three) times daily as needed for anxiety., Disp: 30 tablet, Rfl: 0   LINZESS 72 MCG capsule, Take 1 capsule (72 mcg total) by mouth daily., Disp: 30 capsule, Rfl: 0   loratadine (CLARITIN) 10 MG tablet, Take 1 tablet (10 mg total) by mouth daily., Disp: 30 tablet, Rfl: 0   nitroGLYCERIN (NITROSTAT) 0.4 MG SL tablet, DISSOLVE 1 TABLET UNDER THE TONGUE EVERY 5 MINUTES AS NEEDED FOR CHEST PAIN, Disp: 75 tablet, Rfl: 2   pantoprazole (PROTONIX) 40 MG tablet, Take 1 tablet (40 mg total) by mouth 2 (two) times daily., Disp: 180 tablet, Rfl: 3   rosuvastatin (CRESTOR) 20 MG tablet, TAKE 1 TABLET(20 MG) BY MOUTH DAILY (Patient taking differently: Take 20 mg by mouth daily.), Disp: 30 tablet, Rfl: 2   VENTOLIN HFA 108 (90 Base) MCG/ACT inhaler, INHALE 2 PUFFS INTO THE LUNGS  EVERY 6 HOURS AS NEEDED FOR WHEEZING OR SHORTNESS OF BREATH (Patient taking differently: Inhale 2 puffs into the lungs every 6 (six) hours as needed for wheezing or shortness of breath.), Disp: 18 g, Rfl: 0   Wheat Dextrin (BENEFIBER PO), Take 1 Scoop by mouth daily as needed (fiber)., Disp: , Rfl:    furosemide (LASIX) 20 MG tablet, Take 0.5 tablets (10 mg total) by mouth daily as needed for fluid or edema (for weight gain >5 lbs). (Patient not taking: Reported on 10/18/2022), Disp: 30 tablet, Rfl: 0   predniSONE (DELTASONE) 20 MG tablet, 2 tabs po daily x 3  days (Patient not taking: Reported on 10/18/2022), Disp: 6 tablet, Rfl: 0   Spacer/Aero-Holding Chambers DEVI, Use as directed, Disp: 1 each, Rfl: 5   Exam: Current vital signs: BP 129/65   Pulse 69   Temp 98 F (36.7 C) (Oral)   Resp (!) 22   SpO2 98%  Vital signs in last 24 hours: Temp:  [97.8 F (36.6 C)-98 F (36.7 C)] 98 F (36.7 C) (05/28 2134) Pulse Rate:  [42-72] 69 (05/28 2330) Resp:  [18-31] 22 (05/28 2330) BP: (99-142)/(42-74) 129/65 (05/28 2330) SpO2:  [94 %-100 %] 98 % (05/28 2330) General: Awake alert in no distress HEENT: Normocephalic atraumatic Lungs: Clear Abdomen nondistended nontender Extremities: Tender left shoulder. Neurological exam Awake alert oriented to self, the fact that she is in the hospital but could not tell me the correct month.  Was able to tell me her age correctly. Speaks with some stutter and hesitancy-daughter said that is baseline. Mildly dysarthric speech No evidence of aphasia Poor attention concentration Cranial nerves: Pupils equal round react light, extraocular movements intact, visual fields examination is difficult given her poor attention concentration but no gross deficits noted, tongue and palate midline. Motor examination with mild drift in the left upper extremity but she complains of left shoulder pain and there is restriction in the range of motion of the left shoulder.   Lower extremities are both antigravity without drift. Sensation intact to light touch all over Coordination examination with no gross dysmetria NIHSS 1a Level of Conscious.: 0 1b LOC Questions: 1 1c LOC Commands: 0 2 Best Gaze: 0 3 Visual: 0 4 Facial Palsy: 0 5a Motor Arm - left: 1 5b Motor Arm - Right: 0 6a Motor Leg - Left: 0 6b Motor Leg - Right: 0 7 Limb Ataxia: 0 8 Sensory: 0 9 Best Language: 0 10 Dysarthria: 1 11 Extinct. and Inatten.: 0 TOTAL: 3   Labs I have reviewed labs in epic and the results pertinent to this consultation are:  CBC    Component Value Date/Time   WBC 17.8 (H) 10/18/2022 1900   RBC 3.98 10/18/2022 1900   RBC 4.07 10/18/2022 1900   HGB 12.1 10/18/2022 1900   HGB 13.0 08/18/2022 1655   HCT 37.8 10/18/2022 1900   HCT 38.9 08/18/2022 1655   PLT 208 10/18/2022 1900   PLT 180 08/18/2022 1655   MCV 95.0 10/18/2022 1900   MCV 93 08/18/2022 1655   MCH 30.4 10/18/2022 1900   MCHC 32.0 10/18/2022 1900   RDW 14.0 10/18/2022 1900   RDW 13.0 08/18/2022 1655   LYMPHSABS 0.5 (L) 10/18/2022 1900   LYMPHSABS 1.0 10/07/2021 1559   MONOABS 1.2 (H) 10/18/2022 1900   EOSABS 0.0 10/18/2022 1900   EOSABS 0.1 10/07/2021 1559   BASOSABS 0.0 10/18/2022 1900   BASOSABS 0.0 10/07/2021 1559    CMP     Component Value Date/Time   NA 135 10/18/2022 1900   NA 140 08/18/2022 1655   K 3.2 (L) 10/18/2022 1900   CL 95 (L) 10/18/2022 1900   CO2 27 10/18/2022 1900   GLUCOSE 157 (H) 10/18/2022 1900   BUN 15 10/18/2022 1900   BUN 13 08/18/2022 1655   CREATININE 0.56 10/18/2022 1900   CREATININE 0.41 (L) 08/21/2015 0001   CALCIUM 8.9 10/18/2022 1900   PROT 5.6 (L) 10/18/2022 1900   PROT 6.3 08/18/2022 1655   ALBUMIN 3.2 (L) 10/18/2022 1900   ALBUMIN 4.4 08/18/2022 1655   AST 32 10/18/2022 1900   ALT  26 10/18/2022 1900   ALKPHOS 37 (L) 10/18/2022 1900   BILITOT 0.6 10/18/2022 1900   BILITOT 0.5 08/18/2022 1655   GFRNONAA >60 10/18/2022 1900   GFRAA 100  11/13/2019 1529    Lipid Panel     Component Value Date/Time   CHOL 151 11/13/2019 1529   TRIG 84 11/13/2019 1529   HDL 77 11/13/2019 1529   CHOLHDL 2.0 11/13/2019 1529   CHOLHDL 2.0 08/21/2015 0001   VLDL 17 08/21/2015 0001   LDLCALC 58 11/13/2019 1529    Urinalysis reveals bacteriuria  Imaging I have reviewed the images obtained:  CT head, CT angiography head and neck concerning for a subacute right posterior MCA territory infarct with MRI recommended for further evaluation.  There is also a hypodensity in the left cerebellum noted by the radiologist which is reportedly concerning for a subacute infarction.  No intracranial LVO.  Somewhat diminished opacification of the posterior MCA branches on the right compared to left without focal stenosis.  Severe stenosis of bilateral cavernous and supraclinoid ICAs with moderate stenosis of the right supraclinoid segment and mild stenosis of the left cavernous segment.  80% stenosis of the origin of the left subclavian artery.  Aortic atherosclerosis.  Emphysema.  Assessment:  89 year old woman with multiple cerebrovascular risk factors including aortic stenosis status post TAVR, coronary artery disease status post CABG, carotid artery disease status post right CEA, hypertension, hyperlipidemia, paroxysmal atrial fibrillation who according to the daughter is compliant with Eliquis, prior strokes with unclear residual deficits came in for evaluation of a fall and left shoulder pain and weakness.  CT head with concern for subacute right posterior MCA territory infarction.  MRI will have to wait till the morning since he has a pacemaker that cannot be done in the MRI scanner tonight. Neurological consultation for further workup and recommendations. On my examination, she did not have strong left-sided focality although she did have drift in the left upper extremity but her range of motion is also limited by pain in the left shoulder.  She could have  had a stroke given her history of A-fib and other risk factors but the daughter also showed me it one-page printout of a flair image from an unknown time which had FLAIR/T2 hyperintensity in the right MCA territory area-unclear if that is the same as what is seen on CT today or if these findings are new. In any case, an MRI of the brain will help answer that question. Also has cytosis and a possible UTI-could be toxic metabolic encephalopathy leading to falls  Recommendations: Evaluate for stroke-obtain MRI, if positive for stroke then only proceed with full stroke workup Treatment of toxic metabolic derangements per primary team as you are including treatment of the UTI. Further recommendations based on MRI results. Plan was discussed with patient's daughter at bedside as well as Dr. Adela Glimpse over secure chat   -- Milon Dikes, MD Neurologist [facility] Pager: [phone]

## 2022-10-19 NOTE — Assessment & Plan Note (Signed)
Chronic-stable.

## 2022-10-19 NOTE — Assessment & Plan Note (Signed)
Chronic stable continue home medications ?

## 2022-10-19 NOTE — Plan of Care (Signed)
  Problem: Education: Goal: Knowledge of disease or condition will improve Outcome: Progressing Goal: Knowledge of secondary prevention will improve (MUST DOCUMENT ALL) Outcome: Progressing Goal: Knowledge of patient specific risk factors will improve (Mark N/A or DELETE if not current risk factor) Outcome: Progressing   Problem: Ischemic Stroke/TIA Tissue Perfusion: Goal: Complications of ischemic stroke/TIA will be minimized Outcome: Progressing   Problem: Coping: Goal: Will verbalize positive feelings about self Outcome: Progressing Goal: Will identify appropriate support needs Outcome: Progressing   

## 2022-10-19 NOTE — Assessment & Plan Note (Signed)
Will replace and check magnesium level replace electrolytes as needed

## 2022-10-19 NOTE — Assessment & Plan Note (Signed)
Patient currently appears to be slightly on the dry side we will gently rehydrate and follow

## 2022-10-19 NOTE — Evaluation (Signed)
Clinical/Bedside Swallow Evaluation Patient Details  Name: [name] MRN: [id number] Date of Birth: [birth year]  Today's Date: 10/19/2022 Time: SLP Start Time (ACUTE ONLY): 1213 SLP Stop Time (ACUTE ONLY): 1225 SLP Time Calculation (min) (ACUTE ONLY): 12 min  Past Medical History:  Past Medical History:  Diagnosis Date   Anxiety    Aortic Stenosis s/p TAVR    Echo 10/21: EF 55-60, no RWMA, mild asymmetric LVH, normal RVSF, RVSP 40.2 mmHg, mild MR, s/p TAVR with mean gradient 8.80mmHg, no PVL   Arthritis    OSTEO   Aspirin allergy    on Plavix   Asthma    CAD (coronary artery disease)    a. s/p CABG 2006 with Cox-Maze procedure.   Carotid artery disease (HCC)    a. s/p R CEA.   Diastolic dysfunction    Eczema    Fibromyalgia    GERD (gastroesophageal reflux disease)    Hiatal hernia    Hyperlipidemia    Hypertension    Kyphoscoliosis    Mitral regurgitation    PAF (paroxysmal atrial fibrillation) (HCC)    a. pt has h/o hematuria on Eliquis and has since refused anticoagulation   Pneumonia 2015 ?   Pulmonary regurgitation    Skin cancer of arm    Stroke [facility])    Tricuspid regurgitation    Past Surgical History:  Past Surgical History:  Procedure Laterality Date   ABDOMINAL HYSTERECTOMY  1976   CAROTID ENDARTERECTOMY  2010   CORONARY ANGIOPLASTY WITH STENT PLACEMENT     CORONARY ARTERY BYPASS GRAFT  01/2005   LIMA-D1; SVG-LAD; SVG-OM; SVG-PDA   ERCP N/A 12/28/2021   Procedure: ENDOSCOPIC RETROGRADE CHOLANGIOPANCREATOGRAPHY (ERCP);  Surgeon: Jeani Hawking, MD;  Location: [facility] ENDOSCOPY;  Service: Gastroenterology;  Laterality: N/A;   ESOPHAGOGASTRODUODENOSCOPY (EGD) WITH PROPOFOL N/A 03/19/2021   Procedure: ESOPHAGOGASTRODUODENOSCOPY (EGD) WITH PROPOFOL;  Surgeon: Jeani Hawking, MD;  Location: [facility] ENDOSCOPY;  Service: Endoscopy;  Laterality: N/A;   EYE SURGERY     MULTIPLE EXTRACTIONS WITH ALVEOLOPLASTY  12/28/2016   Extraction of tooth #'s 2- 5,7-10, 12,13,17-20,and  22-29 with alveoloplasty and maxillary right and left lateral exostoses reductions   MULTIPLE EXTRACTIONS WITH ALVEOLOPLASTY N/A 12/28/2016   Procedure: Extraction of tooth #'s 2- 5,7-10, 12,13,17-20,and 22-29 with alveoloplasty and maxillary right and left lateral exostoses reductions;  Surgeon: Charlynne Pander, DDS;  Location: [facility] OR;  Service: Oral Surgery;  Laterality: N/A;   PACEMAKER IMPLANT N/A 01/15/2021   Procedure: PACEMAKER IMPLANT;  Surgeon: Regan Lemming, MD;  Location: [facility] INVASIVE CV LAB;  Service: Cardiovascular;  Laterality: N/A;   REMOVAL OF STONES  12/28/2021   Procedure: REMOVAL OF STONES;  Surgeon: Jeani Hawking, MD;  Location: [facility] ENDOSCOPY;  Service: Gastroenterology;;   RIGHT/LEFT HEART CATH AND CORONARY/GRAFT ANGIOGRAPHY N/A 10/27/2016   Procedure: Right/Left Heart Cath and Coronary/Graft Angiography;  Surgeon: Kathleene Hazel, MD;  Location: [facility] INVASIVE CV LAB;  Service: Cardiovascular;  Laterality: N/A;   SAVORY DILATION N/A 03/19/2021   Procedure: SAVORY DILATION;  Surgeon: Jeani Hawking, MD;  Location: [facility] ENDOSCOPY;  Service: Endoscopy;  Laterality: N/A;   SPHINCTEROTOMY  12/28/2021   Procedure: SPHINCTEROTOMY;  Surgeon: Jeani Hawking, MD;  Location: [facility] ENDOSCOPY;  Service: Gastroenterology;;   TEE WITHOUT CARDIOVERSION N/A 01/03/2017   Procedure: TRANSESOPHAGEAL ECHOCARDIOGRAM (TEE);  Surgeon: Kathleene Hazel, MD;  Location: [facility] OR;  Service: Open Heart Surgery;  Laterality: N/A;   TONSILLECTOMY     TRANSCATHETER AORTIC VALVE REPLACEMENT, TRANSFEMORAL N/A 01/03/2017  Procedure: TRANSCATHETER AORTIC VALVE REPLACEMENT, TRANSFEMORAL;  Surgeon: Kathleene Hazel, MD;  Location: [facility] OR;  Service: Open Heart Surgery;  Laterality: N/A;   TUMOR REMOVAL     HPI:  89 y.o. female who presented to the emergency room for evaluation after a fall on the left side. CT head 5/28: "Likely subacute right parietotemporal and occipital lobe  infarction." MRI pending.   Pt with a past medical history of aortic stenosis status post TAVR, coronary artery disease status post CABG, carotid artery disease status post right CEA, hypertension, hyperlipidemia, paroxysmal atrial fibrillation on Eliquis, prior strokes with unclear deficits.    Assessment / Plan / Recommendation  Clinical Impression  Pt presents with concerns for a pharyngoesophageal dysphagia.  Pt has been working with [facility] SLP Frances Furbish) and consumes a modified texture diet.  Daughter reports pt alternates food and liquid and consumes small, moist, and bitesized foods.  Pt does not eat bread any more, except for toast with lots of margarine to make it very moist. RN reported difficulty taking medication today with daughter endorsed with larger pill as well.  Pt has not had instrumental swallow assessment, but was planning for MBSS through home health.  Pt is bothered by her swallowing difficulties and spoke about it at length returning to swallowing conversation during and after cognitive assessment.  Pt tolerated all consistencies trialed today and exhibited good oral clearance of purees and solids; however, given pt's complaints regarding symptoms, recommend further evaluation by MBSS.  Discussed diet preference with pt and family.  Will continue current diet at this time rather than downgrade to minced diet.  MBSS planned for next date.   Recommend mechanical soft solids with thin liquids.  SLP Visit Diagnosis: Dysphagia, unspecified (R13.10)    Aspiration Risk  Mild aspiration risk    Diet Recommendation Dysphagia 3 (Mech soft);Thin liquid   Liquid Administration via: Cup;Straw Medication Administration: Crushed with puree (as needed for larger pills) Supervision: Staff to assist with self feeding Compensations: Slow rate;Small sips/bites;Follow solids with liquid Postural Changes: Seated upright at 90 degrees;Remain upright for at least 30 minutes after po intake    Other  Recommendations Oral Care  Recommendations: Oral care BID    Recommendations for follow up therapy are one component of a multi-disciplinary discharge planning process, led by the attending physician.  Recommendations may be updated based on patient status, additional functional criteria and insurance authorization.  Follow up Recommendations  (pending MBSS results)      Assistance Recommended at Discharge None  Functional Status Assessment  (pending MBSS results)  Frequency and Duration  (pending MBSS results)          Prognosis Prognosis for improved oropharyngeal function:  (pending MBSS results)      Swallow Study   General Date of Onset: 10/17/22 HPI: 89 y.o. female who presented to the emergency room for evaluation after a fall on the left side. CT head 5/28: "Likely subacute right parietotemporal and occipital lobe  infarction." MRI pending.  Pt with a past medical history of aortic stenosis status post TAVR, coronary artery disease status post CABG, carotid artery disease status post right CEA, hypertension, hyperlipidemia, paroxysmal atrial fibrillation on Eliquis, prior strokes with unclear deficits. Type of Study: Bedside Swallow Evaluation Previous Swallow Assessment: BSE Sept 2023 Diet Prior to this Study: Dysphagia 3 (mechanical soft);Thin liquids (Level 0) Temperature Spikes Noted: No History of Recent Intubation: No Behavior/Cognition: Alert;Cooperative;Pleasant mood Oral Cavity Assessment: Within Functional Limits Oral Care Completed by SLP: No  Oral Cavity - Dentition: Dentures, top;Dentures, bottom Self-Feeding Abilities: Needs assist Patient Positioning: Upright in bed Baseline Vocal Quality: Normal Volitional Cough: Weak Volitional Swallow: Able to elicit    Oral/Motor/Sensory Function Overall Oral Motor/Sensory Function: Mild impairment Facial ROM: Within Functional Limits Facial Symmetry: Within Functional Limits Lingual ROM: Within Functional Limits Lingual Symmetry: Within  Functional Limits Lingual Strength: Reduced Velum: Within Functional Limits Mandible: Within Functional Limits   Ice Chips Ice chips: Not tested   Thin Liquid Thin Liquid: Within functional limits Presentation: Straw    Nectar Thick Nectar Thick Liquid: Not tested   Honey Thick Honey Thick Liquid: Not tested   Puree Puree: Within functional limits Presentation: Spoon   Solid     Solid: Within functional limits Presentation: Spoon Other Comments: soft solid      Kerrie Pleasure, MA, CCC-SLP Acute Rehabilitation Services Office: [phone] 10/19/2022,1:05 PM

## 2022-10-19 NOTE — Evaluation (Signed)
Occupational Therapy Evaluation Patient Details Name: [name] MRN: [id number] DOB: [birth year] Today's Date: 10/19/2022   History of Present Illness 89 y.o. female who presented to the emergency room for evaluation after a fall on the left side. CT head 5/28: "Likely subacute right parietotemporal and occipital lobe  infarction." MRI pending.  Pt with a past medical history of aortic stenosis status post TAVR, coronary artery disease status post CABG, carotid artery disease status post right CEA, hypertension, hyperlipidemia, paroxysmal atrial fibrillation on Eliquis, prior strokes with unclear deficits.   Clinical Impression   PTA, pt was living at home with her daughter, pt's daughter was assisting with ADL/IADL. Pt was able to complete toileting with supervision. Pt's daughter reports she occasionally has to leave pt home alone for <1hr and during which pt was able to complete toileting as needed. Pt currently requires minA for sit<>stand transfer, maxA for toileting hygiene and minA for bed mobility. Pt tolerated <45min standing x2 trials before requesting to return to sitting due to fatigue. Due to decline in current level of function, pt would benefit from acute OT to address established goals to facilitate safe D/C to venue listed below. At this time, recommend HHOT follow-up with 24/7 assistance to assist pt with toileting, dressing, medication, meal prep, etc. Will continue to follow acutely.       Recommendations for follow up therapy are one component of a multi-disciplinary discharge planning process, led by the attending physician.  Recommendations may be updated based on patient status, additional functional criteria and insurance authorization.   Assistance Recommended at Discharge Frequent or constant Supervision/Assistance  Patient can return home with the following A little help with walking and/or transfers;A little help with bathing/dressing/bathroom;Assistance with  cooking/housework;Direct supervision/assist for medications management;Direct supervision/assist for financial management;Assist for transportation    Functional Status Assessment  Patient has had a recent decline in their functional status and demonstrates the ability to make significant improvements in function in a reasonable and predictable amount of time.  Equipment Recommendations  None recommended by OT    Recommendations for Other Services       Precautions / Restrictions Precautions Precautions: Fall Restrictions Weight Bearing Restrictions: No      Mobility Bed Mobility Overal bed mobility: Needs Assistance Bed Mobility: Supine to Sit     Supine to sit: Min assist     General bed mobility comments: minA for trunk progression    Transfers Overall transfer level: Needs assistance Equipment used: Rolling walker (2 wheels) Transfers: Sit to/from Stand Sit to Stand: Min assist           General transfer comment: minA to powerup into standing      Balance Overall balance assessment: Needs assistance Sitting-balance support: Single extremity supported, Feet supported Sitting balance-[name] Scale: Fair     Standing balance support: Bilateral upper extremity supported, During functional activity, Reliant on assistive device for balance                               ADL either performed or assessed with clinical judgement   ADL Overall ADL's : Needs assistance/impaired Eating/Feeding: Set up;Sitting   Grooming: Set up;Sitting   Upper Body Bathing: Set up;Sitting   Lower Body Bathing: Minimal assistance;Sit to/from stand   Upper Body Dressing : Set up;Sitting   Lower Body Dressing: Minimal assistance;Sit to/from stand Lower Body Dressing Details (indicate cue type and reason): assist for donning/doffing briefs Toilet Transfer:  Minimal assistance Toilet Transfer Details (indicate cue type and reason): minA for powerup into standing and for  stability Toileting- Clothing Manipulation and Hygiene: Maximal assistance Toileting - Clothing Manipulation Details (indicate cue type and reason): maxA for posterior care in standing, pt was soiled assisted her with hygiene     Functional mobility during ADLs: Minimal assistance;Rolling walker (2 wheels) General ADL Comments: pt limited by decreased endurance, decreased activity tolerance, generalized deconditioning, kyphotic posture and cognitive limitations     Vision Baseline Vision/History: 4 Cataracts Ability to See in Adequate Light: 1 Impaired Patient Visual Report: No change from baseline Additional Comments: difficult to complete due to sequencing, pt declining any recent changes     Perception     Praxis      Pertinent Vitals/Pain Pain Assessment Pain Assessment: Faces Faces Pain Scale: Hurts a little bit Pain Location: L leg and L shoulder Pain Descriptors / Indicators: Sore, Grimacing Pain Intervention(s): Limited activity within patient's tolerance, Monitored during session     Hand Dominance Right   Extremity/Trunk Assessment Upper Extremity Assessment Upper Extremity Assessment: Generalized weakness (3+/5 bilaterally, sensation instact)   Lower Extremity Assessment Lower Extremity Assessment: Defer to PT evaluation   Cervical / Trunk Assessment Cervical / Trunk Assessment: Kyphotic   Communication Communication Communication: HOH   Cognition Arousal/Alertness: Awake/alert Behavior During Therapy: WFL for tasks assessed/performed Overall Cognitive Status: History of cognitive impairments - at baseline                                 General Comments: pt likely at baseline, decreased STM, focused/sustained attention. Pt with difficulty following multistep commands, pt following one step commands ~75% of the time, difficutl to discern HOH vs cognition.     General Comments  vss on RA, pts daughter present throughout session     Exercises     Shoulder Instructions      Home Living Family/patient expects to be discharged to:: Private residence Living Arrangements: Children Available Help at Discharge: Family Type of Home: House Home Access: Ramped entrance     Home Layout: One level     Bathroom Shower/Tub: Chief Strategy Officer: Standard Bathroom Accessibility: No   Home Equipment: Agricultural consultant (2 wheels);Cane - single point;BSC/3in1;Other (comment);Transport chair   Additional Comments: pt does not go into bathroom, uses BSC and dtr assists with sponge bathing  Lives With: Daughter    Prior Functioning/Environment Prior Level of Function : Needs assist       Physical Assist : Mobility (physical);ADLs (physical) Mobility (physical): Transfers;Gait;Stairs ADLs (physical): Bathing;Dressing Mobility Comments: uses transport chair, pt was transferring from [facility] <>chair with supervision. was working with HHPT to ambulate short distances at home ADLs Comments: pt's daughter assists with ADL primarily bathing and dressing, assists with IADL as well        OT Problem List: Decreased activity tolerance;Impaired balance (sitting and/or standing);Decreased cognition;Decreased safety awareness      OT Treatment/Interventions: Self-care/ADL training;Therapeutic exercise;DME and/or AE instruction;Energy conservation;Patient/family education;Balance training    OT Goals(Current goals can be found in the care plan section) Acute Rehab OT Goals Patient Stated Goal: to go home OT Goal Formulation: With patient Time For Goal Achievement: 11/02/22 Potential to Achieve Goals: Good ADL Goals Pt Will Perform Lower Body Dressing: with supervision;sit to/from stand Pt Will Transfer to Toilet: with supervision;stand pivot transfer Additional ADL Goal #1: Pt will demonstrate use of 3 fall prevention strategies  wtih minimal cueing.  OT Frequency: Min 2X/week    Co-evaluation               AM-PAC OT "6 Clicks" Daily Activity     Outcome Measure Help from another person eating meals?: None Help from another person taking care of personal grooming?: A Little Help from another person toileting, which includes using toliet, bedpan, or urinal?: A Lot Help from another person bathing (including washing, rinsing, drying)?: A Lot Help from another person to put on and taking off regular upper body clothing?: A Little Help from another person to put on and taking off regular lower body clothing?: A Little 6 Click Score: 17   End of Session Equipment Utilized During Treatment: Gait belt;Rolling walker (2 wheels) Nurse Communication: Mobility status  Activity Tolerance: Patient tolerated treatment well Patient left: in bed;with call bell/phone within reach;with bed alarm set  OT Visit Diagnosis: Other abnormalities of gait and mobility (R26.89);Muscle weakness (generalized) (M62.81);History of falling (Z91.81);Other symptoms and signs involving cognitive function                Time: 1455-1535 OT Time Calculation (min): 40 min Charges:  OT General Charges $OT Visit: 1 Visit OT Evaluation $OT Eval Moderate Complexity: 1 Mod OT Treatments $Self Care/Home Management : 23-37 mins Rosey Bath [name] Acute Rehabilitation Services Office: [phone]   Rebeca Alert 10/19/2022, 3:50 PM

## 2022-10-19 NOTE — Evaluation (Signed)
Speech Language Pathology Evaluation Patient Details Name: [name] MRN: [id number] DOB: 06/11/1928 Today's Date: 10/19/2022 Time: 4098-1191 SLP Time Calculation (min) (ACUTE ONLY): 21 min  Problem List:  Patient Active Problem List   Diagnosis Date Noted   History of stroke 10/19/2022   UTI (urinary tract infection) 10/19/2022   Fall at home, initial encounter 10/19/2022   CVA (cerebral vascular accident) (HCC) 10/18/2022   COPD (chronic obstructive pulmonary disease) (HCC) 09/06/2022   Paronychia of great toe of left foot 08/22/2022   Chronic systolic CHF (congestive heart failure) (HCC) 02/18/2022   DNR (do not resuscitate) 02/18/2022   Choledocholithiasis with acute cholecystitis 01/26/2022   Elevated troponin 01/03/2022   Secondary hypercoagulable state (HCC) 02/04/2021   Unexplained weight loss 01/21/2021   Protein-calorie malnutrition, severe 01/16/2021   Persistent atrial fibrillation (HCC) 01/14/2021   Sick sinus syndrome (HCC) 01/14/2021   Syncope 01/14/2021   Anemia 01/14/2021   Weakness 01/14/2021   GI bleed 04/22/2020   Hypokalemia    Presbycusis of both ears 11/13/2019   Vertigo 03/27/2017   Arthritis 03/27/2017   Stroke (HCC)    Status post transcatheter aortic valve replacement (TAVR) using bioprosthesis 01/03/2017   Chronic apical periodontitis 12/28/2016   Retained dental roots 12/28/2016   Bilateral maxillary lateral exostoses 12/28/2016   Chronic diastolic CHF (congestive heart failure) (HCC)    Chronic stable angina    CAD (coronary artery disease) of artery bypass graft    Tricuspid regurgitation    Pulmonary regurgitation    Left shoulder pain    SOB (shortness of breath)    Back pain-similar to pt's angina 12/01/2015   Carotid artery disease- s/p RCE 12/01/2015   Bowen's disease 03/16/2015   Dependent edema 10/30/2014   History of allergy to aspirin 06/02/2014   PAF (paroxysmal atrial fibrillation) (HCC)    Gastroesophageal reflux  disease without esophagitis 12/05/2011   Hx of CABG 2006 with Maze 07/21/2011   Mitral regurgitation 07/21/2011   Allergic rhinitis 10/05/2010   Dyslipidemia 10/05/2010   Uncomplicated asthma 10/19/2007   Past Medical History:  Past Medical History:  Diagnosis Date   Anxiety    Aortic Stenosis s/p TAVR    Echo 10/21: EF 55-60, no RWMA, mild asymmetric LVH, normal RVSF, RVSP 40.2 mmHg, mild MR, s/p TAVR with mean gradient 8.38mmHg, no PVL   Arthritis    OSTEO   Aspirin allergy    on Plavix   Asthma    CAD (coronary artery disease)    a. s/p CABG 2006 with Cox-Maze procedure.   Carotid artery disease (HCC)    a. s/p R CEA.   Diastolic dysfunction    Eczema    Fibromyalgia    GERD (gastroesophageal reflux disease)    Hiatal hernia    Hyperlipidemia    Hypertension    Kyphoscoliosis    Mitral regurgitation    PAF (paroxysmal atrial fibrillation) (HCC)    a. pt has h/o hematuria on Eliquis and has since refused anticoagulation   Pneumonia 2015 ?   Pulmonary regurgitation    Skin cancer of arm    Stroke [facility])    Tricuspid regurgitation    Past Surgical History:  Past Surgical History:  Procedure Laterality Date   ABDOMINAL HYSTERECTOMY  1976   CAROTID ENDARTERECTOMY  2010   CORONARY ANGIOPLASTY WITH STENT PLACEMENT     CORONARY ARTERY BYPASS GRAFT  01/2005   LIMA-D1; SVG-LAD; SVG-OM; SVG-PDA   ERCP N/A 12/28/2021   Procedure: ENDOSCOPIC RETROGRADE CHOLANGIOPANCREATOGRAPHY (ERCP);  Surgeon: Jeani Hawking, MD;  Location: [facility] ENDOSCOPY;  Service: Gastroenterology;  Laterality: N/A;   ESOPHAGOGASTRODUODENOSCOPY (EGD) WITH PROPOFOL N/A 03/19/2021   Procedure: ESOPHAGOGASTRODUODENOSCOPY (EGD) WITH PROPOFOL;  Surgeon: Jeani Hawking, MD;  Location: [facility] ENDOSCOPY;  Service: Endoscopy;  Laterality: N/A;   EYE SURGERY     MULTIPLE EXTRACTIONS WITH ALVEOLOPLASTY  12/28/2016   Extraction of tooth #'s 2- 5,7-10, 12,13,17-20,and 22-29 with alveoloplasty and maxillary right and left lateral  exostoses reductions   MULTIPLE EXTRACTIONS WITH ALVEOLOPLASTY N/A 12/28/2016   Procedure: Extraction of tooth #'s 2- 5,7-10, 12,13,17-20,and 22-29 with alveoloplasty and maxillary right and left lateral exostoses reductions;  Surgeon: Charlynne Pander, DDS;  Location: [facility] OR;  Service: Oral Surgery;  Laterality: N/A;   PACEMAKER IMPLANT N/A 01/15/2021   Procedure: PACEMAKER IMPLANT;  Surgeon: Regan Lemming, MD;  Location: [facility] INVASIVE CV LAB;  Service: Cardiovascular;  Laterality: N/A;   REMOVAL OF STONES  12/28/2021   Procedure: REMOVAL OF STONES;  Surgeon: Jeani Hawking, MD;  Location: [facility] ENDOSCOPY;  Service: Gastroenterology;;   RIGHT/LEFT HEART CATH AND CORONARY/GRAFT ANGIOGRAPHY N/A 10/27/2016   Procedure: Right/Left Heart Cath and Coronary/Graft Angiography;  Surgeon: Kathleene Hazel, MD;  Location: [facility] INVASIVE CV LAB;  Service: Cardiovascular;  Laterality: N/A;   SAVORY DILATION N/A 03/19/2021   Procedure: SAVORY DILATION;  Surgeon: Jeani Hawking, MD;  Location: [facility] ENDOSCOPY;  Service: Endoscopy;  Laterality: N/A;   SPHINCTEROTOMY  12/28/2021   Procedure: SPHINCTEROTOMY;  Surgeon: Jeani Hawking, MD;  Location: [facility] ENDOSCOPY;  Service: Gastroenterology;;   TEE WITHOUT CARDIOVERSION N/A 01/03/2017   Procedure: TRANSESOPHAGEAL ECHOCARDIOGRAM (TEE);  Surgeon: Kathleene Hazel, MD;  Location: [facility] OR;  Service: Open Heart Surgery;  Laterality: N/A;   TONSILLECTOMY     TRANSCATHETER AORTIC VALVE REPLACEMENT, TRANSFEMORAL N/A 01/03/2017   Procedure: TRANSCATHETER AORTIC VALVE REPLACEMENT, TRANSFEMORAL;  Surgeon: Kathleene Hazel, MD;  Location: [facility] OR;  Service: Open Heart Surgery;  Laterality: N/A;   TUMOR REMOVAL     HPI:  89 y.o. female who presented to the emergency room for evaluation after a fall on the left side. CT head 5/28: "Likely subacute right parietotemporal and occipital lobe  infarction." MRI pending.  Pt with a past medical history of aortic stenosis status post  TAVR, coronary artery disease status post CABG, carotid artery disease status post right CEA, hypertension, hyperlipidemia, paroxysmal atrial fibrillation on Eliquis, prior strokes with unclear deficits.   Assessment / Plan / Recommendation Clinical Impression  Pt presents with significant neurocogitive impairment.  She was assessed using the SLUMS and scored 6 of 30, suggestive for dementia.  Pt has hx of prior strokes.  She lives with her daugther who reports no significant changes to cognitive function. Pt is HoH and has visual impairments from cataracts, but these did not appear to interfere with her her performance today, outside of occasional need for repetition.  Given that pt appears to be at or near cognitive linguistic baseline, no therapy is recommended at this time.    SLP Assessment  SLP Recommendation/Assessment: Patient does not need any further Speech Lanaguage Pathology Services SLP Visit Diagnosis: Cognitive communication deficit (R41.841)    Recommendations for follow up therapy are one component of a multi-disciplinary discharge planning process, led by the attending physician.  Recommendations may be updated based on patient status, additional functional criteria and insurance authorization.    Follow Up Recommendations  No SLP follow up    Assistance Recommended at Discharge  None  Functional Status Assessment  Patient has not had a recent decline in their functional status  Frequency and Duration   N/A        SLP Evaluation Cognition  Overall Cognitive Status: Impaired/Different from baseline Orientation Level: Oriented to person;Oriented to place;Oriented to time Year: 2024 Day of Week: Incorrect Attention: Focused;Sustained Sustained Attention: Impaired Sustained Attention Impairment: Verbal basic Memory: Impaired Memory Impairment: Decreased short term memory Problem Solving: Impaired Executive Function: Reasoning Reasoning: Impaired        Comprehension  Auditory Comprehension Overall Auditory Comprehension: Appears within functional limits for tasks assessed Conversation: Simple Visual Recognition/Discrimination Discrimination: Not tested Reading Comprehension Reading Status: Not tested    Expression Verbal Expression Overall Verbal Expression: Appears within functional limits for tasks assessed Initiation: No impairment Level of Generative/Spontaneous Verbalization: Sentence Divergent: 25-49% accurate Pragmatics: No impairment   Oral / Sales executive Speech Overall Motor Speech: Appears within functional limits for tasks assessed Respiration: Within functional limits Phonation: Normal Resonance: Within functional limits Articulation: Within functional limitis Intelligibility: Intelligible Motor Planning: Witnin functional limits Motor Speech Errors: Not applicable            Kerrie Pleasure, MA, CCC-SLP Acute Rehabilitation Services Office: [phone] 10/19/2022, 12:57 PM

## 2022-10-19 NOTE — TOC Initial Note (Signed)
Transition of Care [facility]) - Initial/Assessment Note    Patient Details  Name: [name] MRN: [id number] Date of Birth: Mar 01, 1929  Transition of Care [facility]) CM/SW Contact:    Kermit Balo, RN Phone Number: 10/19/2022, 11:45 AM  Clinical Narrative:                 Patient is from home with her daughter. Daughter is with her most of the time and assists with ADL's.  Pt has home health with [facility]. Cory with [facility] aware of her admission. Pt with a fall at home. Awaiting therapy evals.  Daughter also concerned about her swallowing. She does state that ST has been seeing her mother at home. Daughter provides pt her medications and denies any issues.  Daughter provides needed transportation. TOC following.  Expected Discharge Plan: Home w Home Health Services Barriers to Discharge: Continued Medical Work up   Patient Goals and CMS Choice   CMS Medicare.gov Compare Post Acute Care list provided to:: Patient Represenative (must comment) Choice offered to / list presented to : Adult Children      Expected Discharge Plan and Services   Discharge Planning Services: CM Consult Post Acute Care Choice: Home Health Living arrangements for the past 2 months: Single Family Home                           HH Arranged: PT, Speech Therapy HH Agency: [facility] Date [facility] Agency Contacted: 10/19/22   Representative spoke with at [facility] Agency: Kandee Keen  Prior Living Arrangements/Services Living arrangements for the past 2 months: Single Family Home Lives with:: Adult Children Patient language and need for interpreter reviewed:: Yes Do you feel safe going back to the place where you live?: Yes      Need for Family Participation in Patient Care: Yes (Comment) Care giver support system in place?: Yes (comment) Current home services: Home PT, DME ([employer]/ transport chair/ [facility]/ wheelchair/ walker) Criminal Activity/Legal Involvement Pertinent to Current Situation/Hospitalization:  No - Comment as needed  Activities of Daily Living Home Assistive Devices/Equipment: Scales, Environmental consultant (specify type), Wheelchair (Transport chair, ramp) ADL Screening (condition at time of admission) Patient's cognitive ability adequate to safely complete daily activities?: Yes Is the patient deaf or have difficulty hearing?: Yes Does the patient have difficulty seeing, even when wearing glasses/contacts?: Yes Does the patient have difficulty concentrating, remembering, or making decisions?: Yes Patient able to express need for assistance with ADLs?: Yes Does the patient have difficulty dressing or bathing?: Yes Independently performs ADLs?: No Communication: Independent Dressing (OT): Needs assistance Is this a change from baseline?: Pre-admission baseline Grooming: Needs assistance Is this a change from baseline?: Pre-admission baseline Feeding: Independent Bathing: Needs assistance Is this a change from baseline?: Pre-admission baseline Toileting: Needs assistance Is this a change from baseline?: Pre-admission baseline In/Out Bed: Needs assistance Is this a change from baseline?: Pre-admission baseline Walks in Home: Needs assistance Is this a change from baseline?: Pre-admission baseline Does the patient have difficulty walking or climbing stairs?: Yes Weakness of Legs: Both Weakness of Arms/Hands: Both  Permission Sought/Granted                  Emotional Assessment Appearance:: Appears stated age     Orientation: : Oriented to Self, Oriented to Place   Psych Involvement: No (comment)  Admission diagnosis:  CVA (cerebral vascular accident) (HCC) [I63.9] Fall, initial encounter [W19.XXXA] Cerebrovascular accident (CVA), unspecified mechanism (HCC) [I63.9]  Patient Active Problem List   Diagnosis Date Noted   History of stroke 10/19/2022   UTI (urinary tract infection) 10/19/2022   Fall at home, initial encounter 10/19/2022   CVA (cerebral vascular accident)  (HCC) 10/18/2022   COPD (chronic obstructive pulmonary disease) (HCC) 09/06/2022   Paronychia of great toe of left foot 08/22/2022   Chronic systolic CHF (congestive heart failure) (HCC) 02/18/2022   DNR (do not resuscitate) 02/18/2022   Choledocholithiasis with acute cholecystitis 01/26/2022   Elevated troponin 01/03/2022   Secondary hypercoagulable state (HCC) 02/04/2021   Unexplained weight loss 01/21/2021   Protein-calorie malnutrition, severe 01/16/2021   Persistent atrial fibrillation (HCC) 01/14/2021   Sick sinus syndrome (HCC) 01/14/2021   Syncope 01/14/2021   Anemia 01/14/2021   Weakness 01/14/2021   GI bleed 04/22/2020   Hypokalemia    Presbycusis of both ears 11/13/2019   Vertigo 03/27/2017   Arthritis 03/27/2017   Stroke (HCC)    Status post transcatheter aortic valve replacement (TAVR) using bioprosthesis 01/03/2017   Chronic apical periodontitis 12/28/2016   Retained dental roots 12/28/2016   Bilateral maxillary lateral exostoses 12/28/2016   Chronic diastolic CHF (congestive heart failure) (HCC)    Chronic stable angina    CAD (coronary artery disease) of artery bypass graft    Tricuspid regurgitation    Pulmonary regurgitation    Left shoulder pain    SOB (shortness of breath)    Back pain-similar to pt's angina 12/01/2015   Carotid artery disease- s/p RCE 12/01/2015   Bowen's disease 03/16/2015   Dependent edema 10/30/2014   History of allergy to aspirin 06/02/2014   PAF (paroxysmal atrial fibrillation) (HCC)    Gastroesophageal reflux disease without esophagitis 12/05/2011   Hx of CABG 2006 with Maze 07/21/2011   Mitral regurgitation 07/21/2011   Allergic rhinitis 10/05/2010   Dyslipidemia 10/05/2010   Uncomplicated asthma 10/19/2007   PCP:  Ronnald Nian, MD Pharmacy:   [facility] [address] - [place], [place] - [address] [facility] [place] [address] Phone: [phone] Fax: [phone]  [facility]  [facility] [address] - [place], [place] - [address] [facility] [address] [facility] [place] [address] Phone: [phone] Fax: [phone]  [id number] [place], [place] - [address] AT [facility] [address] [facility] [address] [place] [address] Phone: [phone] Fax: [phone]     Social Determinants of Health (SDOH) Social History: SDOH Screenings   Food Insecurity: No Food Insecurity (09/06/2022)  Housing: Low Risk  (09/06/2022)  Transportation Needs: No Transportation Needs (09/06/2022)  Utilities: Not At Risk (09/06/2022)  Depression (PHQ2-9): Low Risk  (04/22/2022)  Financial Resource Strain: Low Risk  (04/22/2022)  Physical Activity: Inactive (04/22/2022)  Stress: No Stress Concern Present (04/22/2022)  Tobacco Use: Medium Risk (10/18/2022)   SDOH Interventions:     Readmission Risk Interventions    12/30/2021   11:52 AM  Readmission Risk Prevention Plan  Post Dischage Appt Complete  Medication Screening Complete  Transportation Screening Complete

## 2022-10-19 NOTE — Progress Notes (Signed)
[facility]                                                                               [name], is a 89 y.o. female, DOB - November 08, 1928, [id number] Admit date - 10/18/2022    Outpatient Primary MD for the patient is Ronnald Nian, MD  LOS - 0  days    Brief summary    89 y.o. female With a past medical history of aortic stenosis status post TAVR, coronary artery disease status post CABG, carotid artery disease status post right CEA, hypertension, hyperlipidemia, paroxysmal atrial fibrillation on Eliquis, prior strokes with unclear deficits presented to the emergency room for evaluation after a fall on the left side.   Assessment & Plan    Assessment and Plan:  Subacute right posterior MCA territory infarct:  Hypodensity in the left cerebellum suspicious for subacute infarction  In the setting of PAF on eliquis.  MRI brain ordered and pending.  Neurology on board and will follow.  LDL is 66.  Continue with Eliquis, crestor 20 mg daily.  SLP/PT/OT evaluations ordered.     Abnormal UA;  Urine cultures pending and pt already started on IV rocephin.    CAD s/p CABG No chest pain.    Carotid artery disease s/p right CEA;  Outpatient follow up with vascular surgery as recommended.    PAF;  Rate controlled with amiodarone and on Eliquis for anti coagulation.     Hypokalemia  Replaced.   Deconditioning / fall on the left side Bruising over the leg, ankle joint and shoulder.  Pain control and therapy evaluations.    RN Pressure Injury Documentation: Pressure Injury 04/22/20 Sacrum Medial Deep Tissue Pressure Injury - Purple or maroon localized area of discolored intact skin or blood-filled blister due to damage of underlying soft tissue from pressure and/or shear. (Active)  04/22/20 2230  Location: Sacrum  Location Orientation: Medial  Staging: Deep Tissue Pressure Injury - Purple or maroon localized area of discolored intact skin or  blood-filled blister due to damage of underlying soft tissue from pressure and/or shear.  Wound Description (Comments):   Present on Admission: Yes     Pressure Injury 02/19/22 Coccyx Medial;Posterior Stage 1 -  Intact skin with non-blanchable redness of a localized area usually over a bony prominence. (Active)  02/19/22 0030  Location: Coccyx  Location Orientation: Medial;Posterior  Staging: Stage 1 -  Intact skin with non-blanchable redness of a localized area usually over a bony prominence.  Wound Description (Comments):   Present on Admission: Yes     Estimated body mass index is 16.49 kg/m as calculated from the following:   Height as of 10/12/22: 5\' 2"  (1.575 m).   Weight as of 10/12/22: 40.9 kg.  Code Status: DNR DVT Prophylaxis:  apixaban (ELIQUIS) tablet 2.5 mg Start: 10/19/22 1200 SCD's Start: 10/19/22 0957 apixaban (ELIQUIS) tablet 2.5 mg   Level of Care: Level of care: Telemetry Medical Family Communication: Updated patient's daughter at bedside.   Disposition Plan:     Remains inpatient appropriate:  fall will need therapy eval and MRI brain.   Procedures:  MRI BRAIN    Consultants:  neurology  Antimicrobials:   Anti-infectives (From admission, onward)    Start     Dose/Rate Route Frequency Ordered Stop   10/19/22 1900  cefTRIAXone (ROCEPHIN) 1 g in sodium chloride 0.9 % 100 mL IVPB        1 g 200 mL/hr over 30 Minutes Intravenous Every 24 hours 10/18/22 2158     10/18/22 2115  cefTRIAXone (ROCEPHIN) 1 g in sodium chloride 0.9 % 100 mL IVPB        1 g 200 mL/hr over 30 Minutes Intravenous  Once 10/18/22 2113 10/18/22 2220        Medications  Scheduled Meds:  amiodarone  200 mg Oral Daily   apixaban  2.5 mg Oral BID   pantoprazole  40 mg Oral BID   rosuvastatin  20 mg Oral Daily   Continuous Infusions:  sodium chloride 75 mL/hr at 10/19/22 1140   cefTRIAXone (ROCEPHIN)  IV     PRN Meds:.acetaminophen **OR** acetaminophen (TYLENOL) oral liquid  160 mg/5 mL **OR** acetaminophen, hydrOXYzine    Subjective:   Pfeiffer Bahar was seen and examined today.  Wants to eat.  Objective:   Vitals:   10/19/22 0245 10/19/22 0700 10/19/22 0715 10/19/22 0848  BP: (!) 123/58 (!) 131/56  (!) 154/78  Pulse: 71 86  70  Resp: 18 19  19   Temp:   98.4 F (36.9 C) 98.3 F (36.8 C)  TempSrc:   Oral Oral  SpO2: 99% 98%  97%    Intake/Output Summary (Last 24 hours) at 10/19/2022 1144 Last data filed at 10/19/2022 0325 Gross per 24 hour  Intake 202.55 ml  Output --  Net 202.55 ml   There were no vitals filed for this visit.   Exam General exam: Appears calm and comfortable  Respiratory system: Clear to auscultation. Respiratory effort normal. Cardiovascular system: S1 & S2 heard, RRR.  Gastrointestinal system: Abdomen is nondistended, soft and nontender.  Central nervous system: Alert and oriented to place and person.  Extremities: no pedal edema cyanosis .  Skin: bruising over the lateral aspect of the leg, elbow joint and left shoulder.  Psychiatry: anxious.     Data Reviewed:  I have personally reviewed following labs and imaging studies   CBC Lab Results  Component Value Date   WBC 7.8 10/19/2022   RBC 3.83 (L) 10/19/2022   HGB 11.7 (L) 10/19/2022   HCT 35.6 (L) 10/19/2022   MCV 93.0 10/19/2022   MCH 30.5 10/19/2022   PLT 184 10/19/2022   MCHC 32.9 10/19/2022   RDW 14.4 10/19/2022   LYMPHSABS 0.7 10/19/2022   MONOABS 0.7 10/19/2022   EOSABS 0.1 10/19/2022   BASOSABS 0.0 10/19/2022     Last metabolic panel Lab Results  Component Value Date   NA 140 10/19/2022   K 3.8 10/19/2022   CL 102 10/19/2022   CO2 25 10/19/2022   BUN 9 10/19/2022   CREATININE 0.49 10/19/2022   GLUCOSE 97 10/19/2022   GFRNONAA >60 10/19/2022   GFRAA 100 11/13/2019   CALCIUM 8.9 10/19/2022   PHOS 2.8 01/18/2021   PROT 5.6 (L) 10/18/2022   ALBUMIN 3.2 (L) 10/18/2022   LABGLOB 1.9 08/18/2022   AGRATIO 2.3 (H) 08/18/2022   BILITOT 0.6  10/18/2022   ALKPHOS 37 (L) 10/18/2022   AST 32 10/18/2022   ALT 26 10/18/2022   ANIONGAP 13 10/19/2022    CBG (last 3)  Recent Labs    10/18/22 1752  GLUCAP 182*      Coagulation  Profile: Recent Labs  Lab 10/18/22 2335  INR 1.2     Radiology Studies: CT ANGIO HEAD NECK W WO CM  Result Date: 10/19/2022 CLINICAL DATA:  Fall, possible subacute infarct on same day CT head EXAM: CT ANGIOGRAPHY HEAD AND NECK WITH AND WITHOUT CONTRAST TECHNIQUE: Multidetector CT imaging of the head and neck was performed using the standard protocol during bolus administration of intravenous contrast. Multiplanar CT image reconstructions and MIPs were obtained to evaluate the vascular anatomy. Carotid stenosis measurements (when applicable) are obtained utilizing NASCET criteria, using the distal internal carotid diameter as the denominator. RADIATION DOSE REDUCTION: This exam was performed according to the departmental dose-optimization program which includes automated exposure control, adjustment of the mA and/or kV according to patient size and/or use of iterative reconstruction technique. CONTRAST:  75mL OMNIPAQUE IOHEXOL 350 MG/ML SOLN COMPARISON:  10/18/2022 CT head FINDINGS: CT HEAD FINDINGS Brain: Redemonstrated ill-defined hypodensity in the right posterior MCA territory. Hypodensity is also noted in the left cerebellum (series 5, image 8). No evidence of acute hemorrhage, mass, mass effect, or midline shift. No hydrocephalus or extra-axial fluid collection. Periventricular white matter changes, likely the sequela of chronic small vessel ischemic disease. Vascular: No hyperdense vessel. Atherosclerotic calcifications in the intracranial carotid and vertebral arteries. Skull: Negative for fracture or focal lesion. Sinuses/Orbits: Mucous retention cysts in the maxillary sinuses. Status post bilateral lens replacements. Other: The mastoid air cells are well aerated. CTA NECK FINDINGS Evaluation is somewhat  limited by motion artifact. Aortic arch: Standard branching. Imaged portion shows no evidence of aneurysm or dissection. Aortic atherosclerosis, which extends into the arch vessels and causes 80% stenosis at the origin of the left subclavian artery (series 11, image 306). Right carotid system: No evidence of dissection, occlusion, or hemodynamically significant stenosis (greater than 50%), although evaluation of the mid to distal right ICA is significantly motion limited. Left carotid system: No evidence of dissection, occlusion, or hemodynamically significant stenosis (greater than 50%). Vertebral arteries: No evidence of dissection, occlusion, or hemodynamically significant stenosis (greater than 50%). Skeleton: No acute osseous abnormality. Degenerative changes in the cervical spine. Edentulous. Other neck: Hypoenhancing lesions in the thyroid, which measure up to 8 mm, for which no follow-up is currently indicated. (Reference: J Am Coll Radiol. 2015 Feb;12(2): 143-50) Upper chest: No focal pulmonary opacity or pleural effusion. Right greater than left apical pleuroparenchymal scarring. Emphysema. No pleural effusion. Review of the MIP images confirms the above findings CTA HEAD FINDINGS Anterior circulation: Both internal carotid arteries are patent to the termini, with severe stenosis in the right cavernous segment and left supraclinoid segment, moderate stenosis in the right supraclinoid segment, and mild stenosis in the left cavernous segment. A1 segments patent. Normal anterior communicating artery. Anterior cerebral arteries are patent to their distal aspects without significant stenosis. No M1 stenosis or occlusion. MCA branches perfused to their distal aspects without significant stenosis, although there is somewhat diminished opacification of the posterior MCA branches on the right compared to the left (series 14, image 18). Posterior circulation: Vertebral arteries patent to the vertebrobasilar junction  without significant stenosis. Posterior inferior cerebellar arteries patent proximally. Basilar patent to its distal aspect without significant stenosis. Superior cerebellar arteries patent proximally. Patent P1 segments. PCAs perfused to their distal aspects without significant stenosis. The left posterior communicating artery is patent. Venous sinuses: Not well opacified due to phase of contrast. Anatomic variants: None significant. Review of the MIP images confirms the above findings IMPRESSION: 1. Redemonstrated hypodensity in the right posterior  MCA territory, which remains concerning for subacute infarct. An MRI is recommended for further evaluation. 2. Hypodensity in the left cerebellum, which could represent an additional subacute infarct. 3. No intracranial large vessel occlusion. Somewhat diminished opacification of the posterior MCA branches on the right compared to the left without focal stenosis. 4. Severe stenosis in the bilateral cavernous and supraclinoid ICAs, with moderate stenosis in the right supraclinoid segment and mild stenosis in the left cavernous segment. 5. 80% stenosis at the origin the left subclavian artery. No other hemodynamically significant stenosis in the neck, although evaluation of the mid to distal right ICA is limited by motion artifact. 6. Aortic Atherosclerosis (ICD10-I70.0) and Emphysema (ICD10-J43.9). Electronically Signed   By: Wiliam Ke M.D.   On: 10/19/2022 00:19   DG Tibia/Fibula Left  Result Date: 10/18/2022 CLINICAL DATA:  Recent fall with left lower leg pain, initial encounter EXAM: LEFT TIBIA AND FIBULA - 2 VIEW COMPARISON:  None Available. FINDINGS: No acute fracture in the tibia or fibula is noted. No soft tissue abnormality is seen. IMPRESSION: No acute abnormality noted. Electronically Signed   By: Alcide Clever M.D.   On: 10/18/2022 22:28   CT HEAD WO CONTRAST  Result Date: 10/18/2022 CLINICAL DATA:  Head trauma, minor (Age >= 65y); Neck trauma (Age  >= 65y). History of prior strokes EXAM: CT HEAD WITHOUT CONTRAST CT CERVICAL SPINE WITHOUT CONTRAST TECHNIQUE: Multidetector CT imaging of the head and cervical spine was performed following the standard protocol without intravenous contrast. Multiplanar CT image reconstructions of the cervical spine were also generated. RADIATION DOSE REDUCTION: This exam was performed according to the departmental dose-optimization program which includes automated exposure control, adjustment of the mA and/or kV according to patient size and/or use of iterative reconstruction technique. COMPARISON:  CT angiography chest 10/12/2022 FINDINGS: CT HEAD FINDINGS Brain: Cerebral ventricle sizes are concordant with the degree of cerebral volume loss. Patchy and confluent areas of decreased attenuation are noted throughout the deep and periventricular white matter of the cerebral hemispheres bilaterally, compatible with chronic microvascular ischemic disease. Right parietotemporal and occipital lobe loss of gray-white matter differentiation. No parenchymal hemorrhage. No mass lesion. No extra-axial collection. No mass effect or midline shift. No hydrocephalus. Basilar cisterns are patent. Vascular: No hyperdense vessel. Atherosclerotic calcifications are present within the cavernous internal carotid and vertebral arteries. Skull: No acute fracture or focal lesion. Sinuses/Orbits: Paranasal sinuses and mastoid air cells are clear. Right lens replacement. Otherwise the orbits are unremarkable. Other: None. CT CERVICAL SPINE FINDINGS Alignment: Normal. Skull base and vertebrae: Multilevel moderate degenerative changes of the spine. Associated posterior disc osteophyte complex formation at the C3-C4 and C5-C6 levels. Associated multilevel moderate osseous neural foraminal stenosis. No acute fracture. No aggressive appearing focal osseous lesion or focal pathologic process. Soft tissues and spinal canal: No prevertebral fluid or swelling. No  visible canal hematoma. Upper chest: Biapical pleural/pulmonary scarring. Emphysematous changes. Stable chronic 8 mm nodule like left upper lobe pulmonary nodule. Other: Severe atherosclerotic plaque of the aortic arch and its branches. Partially visualized cardiac pacemaker leads. IMPRESSION: 1. Likely subacute right parietotemporal and occipital lobe infarction. 2. No acute intracranial hemorrhage. 3. No acute displaced fracture or traumatic listhesis of the cervical spine. 4. Aortic Atherosclerosis (ICD10-I70.0) and Emphysema (ICD10-J43.9). These results were called by telephone at the time of interpretation on 10/18/2022 at 8:55 pm to provider [facility] , who verbally acknowledged these results. Electronically Signed   By: Tish Frederickson M.D.   On: 10/18/2022 20:56  CT CERVICAL SPINE WO CONTRAST  Result Date: 10/18/2022 CLINICAL DATA:  Head trauma, minor (Age >= 65y); Neck trauma (Age >= 65y). History of prior strokes EXAM: CT HEAD WITHOUT CONTRAST CT CERVICAL SPINE WITHOUT CONTRAST TECHNIQUE: Multidetector CT imaging of the head and cervical spine was performed following the standard protocol without intravenous contrast. Multiplanar CT image reconstructions of the cervical spine were also generated. RADIATION DOSE REDUCTION: This exam was performed according to the departmental dose-optimization program which includes automated exposure control, adjustment of the mA and/or kV according to patient size and/or use of iterative reconstruction technique. COMPARISON:  CT angiography chest 10/12/2022 FINDINGS: CT HEAD FINDINGS Brain: Cerebral ventricle sizes are concordant with the degree of cerebral volume loss. Patchy and confluent areas of decreased attenuation are noted throughout the deep and periventricular white matter of the cerebral hemispheres bilaterally, compatible with chronic microvascular ischemic disease. Right parietotemporal and occipital lobe loss of gray-white matter differentiation. No  parenchymal hemorrhage. No mass lesion. No extra-axial collection. No mass effect or midline shift. No hydrocephalus. Basilar cisterns are patent. Vascular: No hyperdense vessel. Atherosclerotic calcifications are present within the cavernous internal carotid and vertebral arteries. Skull: No acute fracture or focal lesion. Sinuses/Orbits: Paranasal sinuses and mastoid air cells are clear. Right lens replacement. Otherwise the orbits are unremarkable. Other: None. CT CERVICAL SPINE FINDINGS Alignment: Normal. Skull base and vertebrae: Multilevel moderate degenerative changes of the spine. Associated posterior disc osteophyte complex formation at the C3-C4 and C5-C6 levels. Associated multilevel moderate osseous neural foraminal stenosis. No acute fracture. No aggressive appearing focal osseous lesion or focal pathologic process. Soft tissues and spinal canal: No prevertebral fluid or swelling. No visible canal hematoma. Upper chest: Biapical pleural/pulmonary scarring. Emphysematous changes. Stable chronic 8 mm nodule like left upper lobe pulmonary nodule. Other: Severe atherosclerotic plaque of the aortic arch and its branches. Partially visualized cardiac pacemaker leads. IMPRESSION: 1. Likely subacute right parietotemporal and occipital lobe infarction. 2. No acute intracranial hemorrhage. 3. No acute displaced fracture or traumatic listhesis of the cervical spine. 4. Aortic Atherosclerosis (ICD10-I70.0) and Emphysema (ICD10-J43.9). These results were called by telephone at the time of interpretation on 10/18/2022 at 8:55 pm to provider [facility] , who verbally acknowledged these results. Electronically Signed   By: Tish Frederickson M.D.   On: 10/18/2022 20:56   DG Chest 2 View  Result Date: 10/18/2022 CLINICAL DATA:  fall EXAM: CHEST - 2 VIEW COMPARISON:  Chest x-ray 10/12/2022, ct chest 10/12/22, cxr 08/22/22 FINDINGS: Left chest wall dual lead pacemaker in grossly appropriate position. The heart and  mediastinal contours are within normal limits. Status post aortic valve replacement. Surgical changes overlie the mediastinum. Aortic calcification. Right apical pleural/pulmonary scarring. Stable calcification along the right lower lobe. No focal consolidation. No pulmonary edema. No pleural effusion. No pneumothorax. No acute osseous abnormality. IMPRESSION: 1. No active cardiopulmonary disease. 2.  Aortic Atherosclerosis (ICD10-I70.0). Electronically Signed   By: Tish Frederickson M.D.   On: 10/18/2022 18:45   DG Shoulder Left  Result Date: 10/18/2022 CLINICAL DATA:  Recent fall with left shoulder pain, initial encounter EXAM: LEFT SHOULDER - 2+ VIEW COMPARISON:  None Available. FINDINGS: Mild degenerative changes of the acromioclavicular joint are seen. No acute fracture or dislocation is noted. The underlying bony thorax appears within normal limits. Pacemaker is seen. IMPRESSION: No acute abnormality noted. Electronically Signed   By: Alcide Clever M.D.   On: 10/18/2022 18:44   DG Hip Unilat W or Wo Pelvis 2-3 Views Left  Result  Date: 10/18/2022 CLINICAL DATA:  Recent fall with left hip pain, initial encounter EXAM: DG HIP (WITH OR WITHOUT PELVIS) 3V LEFT COMPARISON:  None Available. FINDINGS: Pelvic ring appears intact. No acute fracture or dislocation is noted. No soft tissue abnormality is seen. IMPRESSION: No acute abnormality noted. Electronically Signed   By: Alcide Clever M.D.   On: 10/18/2022 18:38   CUP PACEART REMOTE DEVICE CHECK  Result Date: 10/18/2022 Scheduled remote reviewed. Normal device function.  Known PAF, currently in progress from 5/27, controlled rates Eliquis per PA report Next remote 91 days. [place], CVRS      Kathlen Mody M.D. [facility] 10/19/2022, 11:44 AM  Available via Epic secure chat 7am-7pm After 7 pm, please refer to night coverage provider listed on amion.

## 2022-10-19 NOTE — Assessment & Plan Note (Signed)
Will need PT OT assessment prior to discharge  

## 2022-10-19 NOTE — Assessment & Plan Note (Signed)
-   Continue amiodarone and Eliquis 

## 2022-10-19 NOTE — Progress Notes (Signed)
ANTICOAGULATION CONSULT NOTE - Initial Consult  Pharmacy Consult for apixaban Indication: atrial fibrillation  Allergies  Allergen Reactions   Other Other (See Comments)    NO ACIDIC, TART< OR SPICY FOODS- DEVELOPS REFLUX OFTEN!!  PATIENT HAS TROUBLE SWALLOWING TABLETS!!   Bactrim [Sulfamethoxazole-Trimethoprim] Other (See Comments)    "makes her feel funny" or "unsteady"    Amitriptyline Hcl Rash   Aspirin Hives, Swelling, Rash and Other (See Comments)    Body became swollen   Tape Other (See Comments)    SKIN IS VERY THIN- WILL TEAR EASILY!!   Zetia [Ezetimibe] Rash   Vital Signs: Temp: 98 F (36.7 C) (05/28 2134) Temp Source: Oral (05/28 2134) BP: 129/65 (05/28 2330) Pulse Rate: 69 (05/28 2330)  Labs: Recent Labs    10/18/22 1900 10/18/22 2335  HGB 12.1  --   HCT 37.8  --   PLT 208  --   LABPROT  --  15.4*  INR  --  1.2  CREATININE 0.56  --   CKTOTAL 357*  --     Estimated Creatinine Clearance: 29.6 mL/min (by C-G formula based on SCr of 0.56 mg/dL).   Medical History: Past Medical History:  Diagnosis Date   Anxiety    Aortic Stenosis s/p TAVR    Echo 10/21: EF 55-60, no RWMA, mild asymmetric LVH, normal RVSF, RVSP 40.2 mmHg, mild MR, s/p TAVR with mean gradient 8.71mmHg, no PVL   Arthritis    OSTEO   Aspirin allergy    on Plavix   Asthma    CAD (coronary artery disease)    a. s/p CABG 2006 with Cox-Maze procedure.   Carotid artery disease (HCC)    a. s/p R CEA.   Diastolic dysfunction    Eczema    Fibromyalgia    GERD (gastroesophageal reflux disease)    Hiatal hernia    Hyperlipidemia    Hypertension    Kyphoscoliosis    Mitral regurgitation    PAF (paroxysmal atrial fibrillation) (HCC)    a. pt has h/o hematuria on Eliquis and has since refused anticoagulation   Pneumonia 2015 ?   Pulmonary regurgitation    Skin cancer of arm    Stroke [facility])    Tricuspid regurgitation     Assessment: 89yo female presents to ED after fall at home, pt  has h/o stroke and CTA concerning for subacute vs older CVA, for now to continue current medical management while awaiting MRI; pt on Eliquis PTA for Afib, last dose taken 5/27 pm.  Plan:  Continue apixaban 2.5mg  PO BID.  Vernard Gambles, PharmD, BCPS  10/19/2022,1:39 AM

## 2022-10-19 NOTE — Assessment & Plan Note (Signed)
Patient has known history of stroke currently on Eliquis neurology have reviewed imaging feels that current CT is more consistent with old CVA rather than new.  The symptoms could be explained by UTI and dehydration.  Recommend at this time continue Eliquis.  Obtain MRI in the morning when able since patient does have a pacemaker.  If MRI is positive for new CVA may need further adjustment of management will for now we will continue with current.  Of note patient is allergic to aspirin

## 2022-10-19 NOTE — Assessment & Plan Note (Signed)
Chronic stable continue Crestor 20 mg a day patient is not on aspirin because she is allergic if no evidence of CVA would need to restart her oral blood pressure medications

## 2022-10-19 NOTE — TOC Benefit Eligibility Note (Signed)
Patient Product/process development scientist completed.    The patient is currently admitted and upon discharge could be taking Eliquis 5 mg.  The current 30 day co-pay is $11.20.   The patient is insured through [employer]   This test claim was processed through [facility]- copay amounts may vary at other pharmacies due to pharmacy/plan contracts, or as the patient moves through the different stages of their insurance plan.  Roland Earl, CPHT Pharmacy Patient Advocate Specialist [facility] Patient Advocate Team Direct Number: [phone]  Fax: [phone]

## 2022-10-19 NOTE — Assessment & Plan Note (Signed)
Continue Crestor 20 mg a day

## 2022-10-19 NOTE — Assessment & Plan Note (Signed)
-   treat with Rocephin         await results of urine culture and adjust antibiotic coverage as needed  

## 2022-10-20 ENCOUNTER — Inpatient Hospital Stay (HOSPITAL_COMMUNITY): Payer: Medicare Other

## 2022-10-20 DIAGNOSIS — N3000 Acute cystitis without hematuria: Secondary | ICD-10-CM | POA: Diagnosis not present

## 2022-10-20 DIAGNOSIS — I639 Cerebral infarction, unspecified: Secondary | ICD-10-CM | POA: Diagnosis not present

## 2022-10-20 DIAGNOSIS — E876 Hypokalemia: Secondary | ICD-10-CM | POA: Diagnosis not present

## 2022-10-20 DIAGNOSIS — E785 Hyperlipidemia, unspecified: Secondary | ICD-10-CM | POA: Diagnosis not present

## 2022-10-20 LAB — GLUCOSE, CAPILLARY
Glucose-Capillary: 93 mg/dL (ref 70–99)
Glucose-Capillary: 88 mg/dL (ref 70–99)
Glucose-Capillary: 102 mg/dL — ABNORMAL HIGH (ref 70–99)
Glucose-Capillary: 117 mg/dL — ABNORMAL HIGH (ref 70–99)
Glucose-Capillary: 103 mg/dL — ABNORMAL HIGH (ref 70–99)
Glucose-Capillary: 102 mg/dL — ABNORMAL HIGH (ref 70–99)

## 2022-10-20 LAB — URINE CULTURE

## 2022-10-20 LAB — HEMOGLOBIN A1C
Hgb A1c MFr Bld: 6.1 % — ABNORMAL HIGH (ref 4.8–5.6)
Mean Plasma Glucose: 128 mg/dL

## 2022-10-20 MED ORDER — ONDANSETRON HCL 4 MG/2ML IJ SOLN
INTRAMUSCULAR | Status: AC
  Administered 2022-10-20: 4 mg
  Filled 2022-10-20: qty 2

## 2022-10-20 MED ORDER — ENSURE ENLIVE PO LIQD
237.0000 mL | Freq: Two times a day (BID) | ORAL | Status: DC
  Administered 2022-10-20 – 2022-10-21 (×2): 237 mL via ORAL

## 2022-10-20 MED ORDER — ONDANSETRON HCL 4 MG/2ML IJ SOLN: 4.0000 mg | Freq: Four times a day (QID) | INTRAMUSCULAR | Status: DC | PRN

## 2022-10-20 MED ORDER — ADULT MULTIVITAMIN W/MINERALS CH
1.0000 | ORAL_TABLET | Freq: Every day | ORAL | Status: DC
  Administered 2022-10-20 – 2022-10-21 (×2): 1 via ORAL
  Filled 2022-10-20 (×2): qty 1

## 2022-10-20 NOTE — Procedures (Signed)
Modified Barium Swallow Study  Patient Details  Name: [name] MRN: [id number] Date of Birth: 05/11/1929  Today's Date: 10/20/2022  Modified Barium Swallow completed.  Full report located under Chart Review in the Imaging Section.  History of Present Illness 89 y.o. female who presented to the emergency room for evaluation after a fall on the left side. CT head 5/28: "Likely subacute right parietotemporal and occipital lobe  infarction." MRI pending.  Pt with a past medical history of aortic stenosis status post TAVR, coronary artery disease status post CABG, carotid artery disease status post right CEA, hypertension, hyperlipidemia, paroxysmal atrial fibrillation on Eliquis, prior strokes with unclear deficits.   Clinical Impression Pt presents with functional oropharyngeal swallowing.  There was no penetration or aspiration of any consistencies trialed. During bolus hold, there was premature spillage to the pyriforms with pt inititing swallow prior to any penetration occuring. During pill simulation there was brief vallecula stasis of tablet with dry swallow.  This cleared with liquid wash.  There was esophageal stasis of tablet on esophageal sweep.  With additional liquid bolus trials x3 the pill cleared the esophagus.  There is the appereance of possible cervical osteophytes, which did not impede bolus flow, but may contribute to the sensating of pt having difficult getting things down.  Additionally, there appeared to be some calcification around the level of the vocal folds (see image below), which was unrelated to swallow function.  Pt is safe to continue up to regular texture diet with thin liquids.  Recommend continuing HH ST to reinforce strategies. If symptoms persist and are bothersome, consider GI referral for further esophageal assessment.  Pt has no further acute ST needs.  SLP will sign off.   Recommend continuing mechanical soft diet with thin liquids.   Osteophytes and  calicifcations   Factors that may increase risk of adverse event in presence of aspiration Rubye Oaks & Clearance Coots 2021):    Swallow Evaluation Recommendations Recommendations: PO diet PO Diet Recommendation: Dysphagia 3 (Mechanical soft);Thin liquids (Level 0) Liquid Administration via: Cup;Straw Medication Administration: Whole meds with liquid Supervision: Intermittent supervision/cueing for swallowing strategies Swallowing strategies  : Follow solids with liquids;Small bites/sips;Slow rate Postural changes: Position pt fully upright for meals;Stay upright 30-60 min after meals Oral care recommendations: Oral care BID (2x/day) Recommended consults: Consider GI consultation (possibly OP, if syptoms persist and are bothersome)      Kerrie Pleasure, MA, CCC-SLP Acute Rehabilitation Services Office: [phone] 10/20/2022,12:34 PM

## 2022-10-20 NOTE — [place] FL2 (Signed)
[place] MEDICAID FL2 LEVEL OF CARE FORM     IDENTIFICATION  Patient Name: [name] Birthdate: Oct 09, 1928 Sex: female Admission Date (Current Location): 10/18/2022  [facility] and [place] Number:  Producer, television/film/video and Address:  [facility], [address], [place], [place] [address]      Provider Number: [id number]  Attending Physician Name and Address:  Kathlen Mody, MD  Relative Name and Phone Number:       Current Level of Care: Hospital Recommended Level of Care: Skilled Nursing Facility Prior Approval Number:    Date Approved/Denied:   PASRR Number: [id number]  Discharge Plan: SNF    Current Diagnoses: Patient Active Problem List   Diagnosis Date Noted   History of stroke 10/19/2022   UTI (urinary tract infection) 10/19/2022   Fall at home, initial encounter 10/19/2022   Fall 10/19/2022   CVA (cerebral vascular accident) (HCC) 10/18/2022   COPD (chronic obstructive pulmonary disease) (HCC) 09/06/2022   Paronychia of great toe of left foot 08/22/2022   Chronic systolic CHF (congestive heart failure) (HCC) 02/18/2022   DNR (do not resuscitate) 02/18/2022   Choledocholithiasis with acute cholecystitis 01/26/2022   Elevated troponin 01/03/2022   Secondary hypercoagulable state (HCC) 02/04/2021   Unexplained weight loss 01/21/2021   Protein-calorie malnutrition, severe 01/16/2021   Persistent atrial fibrillation (HCC) 01/14/2021   Sick sinus syndrome (HCC) 01/14/2021   Syncope 01/14/2021   Anemia 01/14/2021   Weakness 01/14/2021   GI bleed 04/22/2020   Hypokalemia    Presbycusis of both ears 11/13/2019   Vertigo 03/27/2017   Arthritis 03/27/2017   Stroke (HCC)    Status post transcatheter aortic valve replacement (TAVR) using bioprosthesis 01/03/2017   Chronic apical periodontitis 12/28/2016   Retained dental roots 12/28/2016   Bilateral maxillary lateral exostoses 12/28/2016   Chronic diastolic CHF (congestive heart  failure) (HCC)    Chronic stable angina    CAD (coronary artery disease) of artery bypass graft    Tricuspid regurgitation    Pulmonary regurgitation    Left shoulder pain    SOB (shortness of breath)    Back pain-similar to pt's angina 12/01/2015   Carotid artery disease- s/p RCE 12/01/2015   Bowen's disease 03/16/2015   Dependent edema 10/30/2014   History of allergy to aspirin 06/02/2014   PAF (paroxysmal atrial fibrillation) (HCC)    Gastroesophageal reflux disease without esophagitis 12/05/2011   Hx of CABG 2006 with Maze 07/21/2011   Mitral regurgitation 07/21/2011   Allergic rhinitis 10/05/2010   Dyslipidemia 10/05/2010   Uncomplicated asthma 10/19/2007    Orientation RESPIRATION BLADDER Height & Weight     Self, Place  Normal Incontinent Weight:   Height:     BEHAVIORAL SYMPTOMS/MOOD NEUROLOGICAL BOWEL NUTRITION STATUS      Continent Diet (dysphagia 3 with thin liquids)  AMBULATORY STATUS COMMUNICATION OF NEEDS Skin   Limited Assist Verbally Bruising, Skin abrasions (abrasion to lt knee/ stage 1 to sacrum with foam dressing)                       Personal Care Assistance Level of Assistance  Bathing, Feeding, Dressing Bathing Assistance: Limited assistance Feeding assistance: Limited assistance Dressing Assistance: Limited assistance     Functional Limitations Info  Sight, Hearing, Speech Sight Info: Impaired Hearing Info: Impaired Speech Info: Adequate    SPECIAL CARE FACTORS FREQUENCY  PT (By licensed PT), OT (By licensed OT), Speech therapy     PT Frequency:  5x/wk OT Frequency: 5x/wk     Speech Therapy Frequency: 5x/wk      Contractures Contractures Info: Not present    Additional Factors Info  Code Status, Allergies Code Status Info: DNR Allergies Info: Bactrim (Sulfamethoxazole-trimethoprim), Amitriptyline Hcl, Aspirin, Tape, Zetia (Ezetimibe)           Current Medications (10/20/2022):  This is the current hospital active medication  list Current Facility-Administered Medications  Medication Dose Route Frequency Provider Last Rate Last Admin   0.9 %  sodium chloride infusion   Intravenous Continuous Therisa Doyne, MD 75 mL/hr at 10/19/22 1539 New Bag at 10/19/22 1539   acetaminophen (TYLENOL) tablet 650 mg  650 mg Oral Q4H PRN Therisa Doyne, MD   650 mg at 10/19/22 1147   Or   acetaminophen (TYLENOL) 160 MG/5ML solution 650 mg  650 mg Per Tube Q4H PRN Therisa Doyne, MD       Or   acetaminophen (TYLENOL) suppository 650 mg  650 mg Rectal Q4H PRN Doutova, Anastassia, MD       amiodarone (PACERONE) tablet 200 mg  200 mg Oral Daily Doutova, Anastassia, MD   200 mg at 10/20/22 1006   apixaban (ELIQUIS) tablet 2.5 mg  2.5 mg Oral BID Kathlen Mody, MD   2.5 mg at 10/20/22 1006   cefTRIAXone (ROCEPHIN) 1 g in sodium chloride 0.9 % 100 mL IVPB  1 g Intravenous Q24H Doutova, Anastassia, MD   Stopped at 10/19/22 1942   hydrOXYzine (ATARAX) tablet 10 mg  10 mg Oral TID PRN Therisa Doyne, MD       pantoprazole (PROTONIX) EC tablet 40 mg  40 mg Oral BID Doutova, Anastassia, MD   40 mg at 10/20/22 1006   rosuvastatin (CRESTOR) tablet 20 mg  20 mg Oral Daily Doutova, Anastassia, MD   20 mg at 10/20/22 1006     Discharge Medications: Please see discharge summary for a list of discharge medications.  Relevant Imaging Results:  Relevant Lab Results:   Additional Information SSN# [id number] Pt has been vaccinated however not boostered  Kermit Balo, RN

## 2022-10-20 NOTE — TOC Progression Note (Addendum)
Transition of Care [facility]) - Progression Note    Patient Details  Name: [name] MRN: [id number] Date of Birth: 10-19-1928  Transition of Care [facility] CM/SW Contact  Kermit Balo, RN Phone Number: 10/20/2022, 11:07 AM  Clinical Narrative:    PT recommending SNF rehab. Pt and her daughter are in agreement. Pt has been to [facility] in the past and she would like to attend rehab with them again. CM has sent a message to Weston at [facility].  TOC following.  1243: [facility] has offered her a bed for tomorrow. Cm has updated the MD and will update the pt and daughter.   Expected Discharge Plan: Skilled Nursing Facility Barriers to Discharge: Continued Medical Work up  Expected Discharge Plan and Services   Discharge Planning Services: CM Consult Post Acute Care Choice: Home Health Living arrangements for the past 2 months: Single Family Home                           HH Arranged: PT, Speech Therapy HH Agency: [facility] Date [facility] Agency Contacted: 10/19/22   Representative spoke with at [facility] Agency: Kandee Keen   Social Determinants of Health (SDOH) Interventions SDOH Screenings   Food Insecurity: No Food Insecurity (09/06/2022)  Housing: Low Risk  (09/06/2022)  Transportation Needs: No Transportation Needs (09/06/2022)  Utilities: Not At Risk (09/06/2022)  Depression (PHQ2-9): Low Risk  (04/22/2022)  Financial Resource Strain: Low Risk  (04/22/2022)  Physical Activity: Inactive (04/22/2022)  Stress: No Stress Concern Present (04/22/2022)  Tobacco Use: Medium Risk (10/18/2022)    Readmission Risk Interventions    12/30/2021   11:52 AM  Readmission Risk Prevention Plan  Post Dischage Appt Complete  Medication Screening Complete  Transportation Screening Complete

## 2022-10-20 NOTE — Progress Notes (Signed)
Patient stating that she can not breath. RR 32, O2 sats 95% on room air. 3 liters nasal cannula placed with humidifies oxygen.

## 2022-10-20 NOTE — Plan of Care (Signed)
  Problem: Education: Goal: Knowledge of disease or condition will improve Outcome: Progressing   Problem: Education: Goal: Knowledge of secondary prevention will improve (MUST DOCUMENT ALL) Outcome: Progressing   Problem: Education: Goal: Knowledge of patient specific risk factors will improve (Mark N/A or DELETE if not current risk factor) Outcome: Progressing   Problem: Ischemic Stroke/TIA Tissue Perfusion: Goal: Complications of ischemic stroke/TIA will be minimized Outcome: Progressing   

## 2022-10-20 NOTE — Plan of Care (Signed)
  Problem: Health Behavior/Discharge Planning: Goal: Ability to manage health-related needs will improve Outcome: Progressing   Problem: Clinical Measurements: Goal: Ability to maintain clinical measurements within normal limits will improve Outcome: Progressing   

## 2022-10-20 NOTE — Progress Notes (Signed)
[facility]                                                                               Tabathia Meddings, is a 89 y.o. female, DOB - Nov 29, 1928, ZOX:[id number] Admit date - 10/18/2022    Outpatient Primary MD for the patient is Ronnald Nian, MD  LOS - 1  days    Brief summary    89 y.o. female With a past medical history of aortic stenosis status post TAVR, coronary artery disease status post CABG, carotid artery disease status post right CEA, hypertension, hyperlipidemia, paroxysmal atrial fibrillation on Eliquis, prior strokes with unclear deficits presented to the emergency room for evaluation after a fall on the left side.   Assessment & Plan    Assessment and Plan:  Subacute right posterior MCA territory infarct:  Hypodensity in the left cerebellum suspicious for subacute infarction  In the setting of PAF on eliquis.  MRI brain ordered and pending. Last echocardiogram 08/2022 shows LVEF of 45 to 50%, with global hypokinesis, with indeterminate diastolic parameters. Neurology on board and will follow.  LDL is 66.  Continue with Eliquis, crestor 20 mg daily.  SLP/PT/OT evaluations ordered recommending SNF.     UTI  Urine cultures showing E coli , empirically on IV rocephin.    CAD s/p CABG No chest pain.    Carotid artery disease s/p right CEA;  Outpatient follow up with vascular surgery as recommended.    PAF;  Rate controlled with amiodarone and on Eliquis for anti coagulation.     Hypokalemia  Replaced.   Deconditioning / fall on the left side Bruising over the leg, ankle joint and shoulder.  Pain control and therapy evaluations recommending SNF.    RN Pressure Injury Documentation: Pressure Injury 04/22/20 Sacrum Medial Deep Tissue Pressure Injury - Purple or maroon localized area of discolored intact skin or blood-filled blister due to damage of underlying soft tissue from pressure and/or shear. (Active)  04/22/20 2230  Location:  Sacrum  Location Orientation: Medial  Staging: Deep Tissue Pressure Injury - Purple or maroon localized area of discolored intact skin or blood-filled blister due to damage of underlying soft tissue from pressure and/or shear.  Wound Description (Comments):   Present on Admission: Yes     Pressure Injury 02/19/22 Coccyx Medial;Posterior Stage 1 -  Intact skin with non-blanchable redness of a localized area usually over a bony prominence. (Active)  02/19/22 0030  Location: Coccyx  Location Orientation: Medial;Posterior  Staging: Stage 1 -  Intact skin with non-blanchable redness of a localized area usually over a bony prominence.  Wound Description (Comments):   Present on Admission: Yes     Pressure Injury 10/19/22 Sacrum Stage 1 -  Intact skin with non-blanchable redness of a localized area usually over a bony prominence. non blanchable redness on sacrum (Active)  10/19/22 0835  Location: Sacrum  Location Orientation:   Staging: Stage 1 -  Intact skin with non-blanchable redness of a localized area usually over a bony prominence.  Wound Description (Comments): non blanchable redness on sacrum  Present on Admission: Yes  Dressing Type Foam - Lift dressing to assess site every shift 10/20/22  0715     Estimated body mass index is 16.49 kg/m as calculated from the following:   Height as of 10/12/22: 5\' 2"  (1.575 m).   Weight as of 10/12/22: 40.9 kg.  Code Status: DNR DVT Prophylaxis:  apixaban (ELIQUIS) tablet 2.5 mg Start: 10/19/22 1200 SCD's Start: 10/19/22 0957 apixaban (ELIQUIS) tablet 2.5 mg   Level of Care: Level of care: Telemetry Medical Family Communication: Updated patient's daughter at bedside.   Disposition Plan:     Remains inpatient appropriate:  fall will need therapy eval and MRI brain.   Procedures:  MRI BRAIN    Consultants:   neurology  Antimicrobials:   Anti-infectives (From admission, onward)    Start     Dose/Rate Route Frequency Ordered Stop    10/19/22 1900  cefTRIAXone (ROCEPHIN) 1 g in sodium chloride 0.9 % 100 mL IVPB        1 g 200 mL/hr over 30 Minutes Intravenous Every 24 hours 10/18/22 2158     10/18/22 2115  cefTRIAXone (ROCEPHIN) 1 g in sodium chloride 0.9 % 100 mL IVPB        1 g 200 mL/hr over 30 Minutes Intravenous  Once 10/18/22 2113 10/18/22 2220        Medications  Scheduled Meds:  amiodarone  200 mg Oral Daily   apixaban  2.5 mg Oral BID   feeding supplement  237 mL Oral BID BM   multivitamin with minerals  1 tablet Oral Daily   pantoprazole  40 mg Oral BID   rosuvastatin  20 mg Oral Daily   Continuous Infusions:  sodium chloride 75 mL/hr at 10/19/22 1539   cefTRIAXone (ROCEPHIN)  IV Stopped (10/19/22 1942)   PRN Meds:.acetaminophen **OR** acetaminophen (TYLENOL) oral liquid 160 mg/5 mL **OR** acetaminophen, hydrOXYzine    Subjective:   Tawni Wolper was seen and examined today.  No new complaints.  Objective:   Vitals:   10/20/22 0500 10/20/22 0600 10/20/22 0700 10/20/22 0731  BP:    124/62  Pulse:    (!) 59  Resp: 20 17 19 20   Temp:    98.4 F (36.9 C)  TempSrc:    Oral  SpO2:        Intake/Output Summary (Last 24 hours) at 10/20/2022 1339 Last data filed at 10/20/2022 0701 Gross per 24 hour  Intake 1463.84 ml  Output 1050 ml  Net 413.84 ml    There were no vitals filed for this visit.   Exam General exam: Appears calm and comfortable  Respiratory system: Clear to auscultation. Respiratory effort normal. Cardiovascular system: S1 & S2 heard, RRR.  Gastrointestinal system: Abdomen is nondistended, soft and nontender.  Central nervous system: Alert and oriented. No focal neurological deficits. Extremities: Symmetric 5 x 5 power. Skin: No rashes, l Psychiatry: Mood & affect appropriate.      Data Reviewed:  I have personally reviewed following labs and imaging studies   CBC Lab Results  Component Value Date   WBC 7.8 10/19/2022   RBC 3.83 (L) 10/19/2022   HGB 11.7 (L)  10/19/2022   HCT 35.6 (L) 10/19/2022   MCV 93.0 10/19/2022   MCH 30.5 10/19/2022   PLT 184 10/19/2022   MCHC 32.9 10/19/2022   RDW 14.4 10/19/2022   LYMPHSABS 0.7 10/19/2022   MONOABS 0.7 10/19/2022   EOSABS 0.1 10/19/2022   BASOSABS 0.0 10/19/2022     Last metabolic panel Lab Results  Component Value Date   NA 140 10/19/2022   K 3.8 10/19/2022  CL 102 10/19/2022   CO2 25 10/19/2022   BUN 9 10/19/2022   CREATININE 0.49 10/19/2022   GLUCOSE 97 10/19/2022   GFRNONAA >60 10/19/2022   GFRAA 100 11/13/2019   CALCIUM 8.9 10/19/2022   PHOS 2.8 01/18/2021   PROT 5.6 (L) 10/18/2022   ALBUMIN 3.2 (L) 10/18/2022   LABGLOB 1.9 08/18/2022   AGRATIO 2.3 (H) 08/18/2022   BILITOT 0.6 10/18/2022   ALKPHOS 37 (L) 10/18/2022   AST 32 10/18/2022   ALT 26 10/18/2022   ANIONGAP 13 10/19/2022    CBG (last 3)  Recent Labs    10/19/22 2309 10/20/22 0316 10/20/22 0817  GLUCAP 84 93 88       Coagulation Profile: Recent Labs  Lab 10/18/22 2335  INR 1.2      Radiology Studies: DG Swallowing Func-Speech Pathology  Result Date: 10/20/2022 Table formatting from the original result was not included. Images from the original result were not included. Modified Barium Swallow Study Patient Details Name: NOLANI NORBERTO MRN: [id number] Date of Birth: 01-16-1929 Today's Date: 10/20/2022 HPI/PMH: HPI: 89 y.o. female who presented to the emergency room for evaluation after a fall on the left side. CT head 5/28: "Likely subacute right parietotemporal and occipital lobe  infarction." MRI pending.  Pt with a past medical history of aortic stenosis status post TAVR, coronary artery disease status post CABG, carotid artery disease status post right CEA, hypertension, hyperlipidemia, paroxysmal atrial fibrillation on Eliquis, prior strokes with unclear deficits. Clinical Impression: Clinical Impression: Pt presents with functional oropharyngeal swallowing.  There was no penetration or aspiration of any  consistencies trialed. During bolus hold, there was premature spillage to the pyriforms with pt inititing swallow prior to any penetration occuring. During pill simulation there was brief vallecula stasis of tablet with dry swallow.  This cleared with liquid wash.  There was esophageal stasis of tablet on esophageal sweep.  With additional liquid bolus trials x3 the pill cleared the esophagus.  There is the appereance of possible cervical osteophytes, which did not impede bolus flow, but may contribute to the sensating of pt having difficult getting things down.  Additionally, there appeared to be some calcification around the level of the vocal folds (see image below), which was unrelated to swallow function.  Pt is safe to continue up to regular texture diet with thin liquids.  Recommend continuing HH ST to reinforce strategies. If symptoms persist and are bothersome, consider GI referral for further esophageal assessment.  Pt has no further acute ST needs.  SLP will sign off.   Recommend continuing mechanical soft diet with thin liquids. Possible cervical osteophytes and calcification Factors that may increase risk of adverse event in presence of aspiration Rubye Oaks & Clearance Coots 2021): No data recorded Recommendations/Plan: Swallowing Evaluation Recommendations Swallowing Evaluation Recommendations Recommendations: PO diet PO Diet Recommendation: Dysphagia 3 (Mechanical soft); Thin liquids (Level 0) Liquid Administration via: Cup; Straw Medication Administration: Whole meds with liquid Supervision: Intermittent supervision/cueing for swallowing strategies Swallowing strategies  : Follow solids with liquids; Small bites/sips; Slow rate Postural changes: Position pt fully upright for meals; Stay upright 30-60 min after meals Oral care recommendations: Oral care BID (2x/day) Recommended consults: Consider GI consultation (possibly OP, if syptoms persist and are bothersome) Treatment Plan Treatment Plan Treatment  recommendations: No treatment recommended at this time Follow-up recommendations: Home health SLP Functional status assessment: Patient has not had a recent decline in their functional status. Treatment frequency: -- (N/A) Recommendations Recommendations for follow up therapy are one component  of a multi-disciplinary discharge planning process, led by the attending physician.  Recommendations may be updated based on patient status, additional functional criteria and insurance authorization. Assessment: Orofacial Exam: Orofacial Exam Oral Cavity - Dentition: Dentures, top; Dentures, bottom Orofacial Anatomy: WFL Oral Motor/Sensory Function: -- (see BSE) Anatomy: No data recorded Boluses Administered: Boluses Administered Boluses Administered: Thin liquids (Level 0); Mildly thick liquids (Level 2, nectar thick); Moderately thick liquids (Level 3, honey thick); Puree; Solid  Oral Impairment Domain: Oral Impairment Domain Lip Closure: No labial escape Tongue control during bolus hold: Posterior escape of less than half of bolus Bolus preparation/mastication: Timely and efficient chewing and mashing Bolus transport/lingual motion: Brisk tongue motion Oral residue: Complete oral clearance Location of oral residue : N/A Initiation of pharyngeal swallow : Pyriform sinuses  Pharyngeal Impairment Domain: Pharyngeal Impairment Domain Soft palate elevation: No bolus between soft palate (SP)/pharyngeal wall (PW) Laryngeal elevation: Partial superior movement of thyroid cartilage/partial approximation of arytenoids to epiglottic petiole Anterior hyoid excursion: Partial anterior movement Epiglottic movement: Complete inversion Laryngeal vestibule closure: Complete, no air/contrast in laryngeal vestibule Pharyngeal stripping wave : Present - diminished Pharyngeal contraction (A/P view only): N/A Pharyngoesophageal segment opening: Complete distension and complete duration, no obstruction of flow Tongue base retraction: Trace  column of contrast or air between tongue base and PPW Pharyngeal residue: Trace residue within or on pharyngeal structures Location of pharyngeal residue: Valleculae; Pyriform sinuses  Esophageal Impairment Domain: Esophageal Impairment Domain Esophageal clearance upright position: Esophageal retention Pill: Esophageal Impairment Domain Esophageal clearance upright position: Esophageal retention Penetration/Aspiration Scale Score: Penetration/Aspiration Scale Score 1.  Material does not enter airway: Thin liquids (Level 0); Mildly thick liquids (Level 2, nectar thick); Moderately thick liquids (Level 3, honey thick); Puree; Solid; Pill Compensatory Strategies: Compensatory Strategies Compensatory strategies: Yes Other(comment): Effective Effective Other(comment): Pill (follow with additional liquid boluses)   General Information: No data recorded Diet Prior to this Study: Dysphagia 3 (mechanical soft); Thin liquids (Level 0)   No data recorded  No data recorded  No data recorded  No data recorded Behavior/Cognition: Alert; Cooperative; Pleasant mood No data recorded No data recorded No data recorded Volitional Swallow: Able to elicit No data recorded Goal Planning: Prognosis for improved oropharyngeal function: Good No data recorded No data recorded Patient/Family Stated Goal: c/o difficulty swallowing Consulted and agree with results and recommendations: Patient; Family member/caregiver Pain: Pain Assessment Pain Assessment: Faces Faces Pain Scale: 2 Pain Location: L shoulder Pain Descriptors / Indicators: Sore; Grimacing Pain Intervention(s): Monitored during session End of Session: Start Time:SLP Start Time (ACUTE ONLY): 1213 Stop Time: SLP Stop Time (ACUTE ONLY): 1225 Time Calculation:SLP Time Calculation (min) (ACUTE ONLY): 12 min Charges: SLP Evaluations $ SLP Speech Visit: 1 Visit SLP Evaluations $BSS Swallow: 1 Procedure $MBS Swallow: 1 Procedure $ SLP EVAL LANGUAGE/SOUND PRODUCTION: 1 Procedure SLP visit  diagnosis: SLP Visit Diagnosis: Dysphagia, unspecified (R13.10) Past Medical History: Past Medical History: Diagnosis Date  Anxiety   Aortic Stenosis s/p TAVR   Echo 10/21: EF 55-60, no RWMA, mild asymmetric LVH, normal RVSF, RVSP 40.2 mmHg, mild MR, s/p TAVR with mean gradient 8.57mmHg, no PVL  Arthritis   OSTEO  Aspirin allergy   on Plavix  Asthma   CAD (coronary artery disease)   a. s/p CABG 2006 with Cox-Maze procedure.  Carotid artery disease (HCC)   a. s/p R CEA.  Diastolic dysfunction   Eczema   Fibromyalgia   GERD (gastroesophageal reflux disease)   Hiatal hernia   Hyperlipidemia   Hypertension  Kyphoscoliosis   Mitral regurgitation   PAF (paroxysmal atrial fibrillation) (HCC)   a. pt has h/o hematuria on Eliquis and has since refused anticoagulation  Pneumonia 2015 ?  Pulmonary regurgitation   Skin cancer of arm   Stroke [facility])   Tricuspid regurgitation  Past Surgical History: Past Surgical History: Procedure Laterality Date  ABDOMINAL HYSTERECTOMY  1976  CAROTID ENDARTERECTOMY  2010  CORONARY ANGIOPLASTY WITH STENT PLACEMENT    CORONARY ARTERY BYPASS GRAFT  01/2005  LIMA-D1; SVG-LAD; SVG-OM; SVG-PDA  ERCP N/A 12/28/2021  Procedure: ENDOSCOPIC RETROGRADE CHOLANGIOPANCREATOGRAPHY (ERCP);  Surgeon: Jeani Hawking, MD;  Location: [facility] ENDOSCOPY;  Service: Gastroenterology;  Laterality: N/A;  ESOPHAGOGASTRODUODENOSCOPY (EGD) WITH PROPOFOL N/A 03/19/2021  Procedure: ESOPHAGOGASTRODUODENOSCOPY (EGD) WITH PROPOFOL;  Surgeon: Jeani Hawking, MD;  Location: [facility] ENDOSCOPY;  Service: Endoscopy;  Laterality: N/A;  EYE SURGERY    MULTIPLE EXTRACTIONS WITH ALVEOLOPLASTY  12/28/2016  Extraction of tooth #'s 2- 5,7-10, 12,13,17-20,and 22-29 with alveoloplasty and maxillary right and left lateral exostoses reductions  MULTIPLE EXTRACTIONS WITH ALVEOLOPLASTY N/A 12/28/2016  Procedure: Extraction of tooth #'s 2- 5,7-10, 12,13,17-20,and 22-29 with alveoloplasty and maxillary right and left lateral exostoses reductions;  Surgeon:  Charlynne Pander, DDS;  Location: [facility] OR;  Service: Oral Surgery;  Laterality: N/A;  PACEMAKER IMPLANT N/A 01/15/2021  Procedure: PACEMAKER IMPLANT;  Surgeon: Regan Lemming, MD;  Location: [facility] INVASIVE CV LAB;  Service: Cardiovascular;  Laterality: N/A;  REMOVAL OF STONES  12/28/2021  Procedure: REMOVAL OF STONES;  Surgeon: Jeani Hawking, MD;  Location: [facility] ENDOSCOPY;  Service: Gastroenterology;;  RIGHT/LEFT HEART CATH AND CORONARY/GRAFT ANGIOGRAPHY N/A 10/27/2016  Procedure: Right/Left Heart Cath and Coronary/Graft Angiography;  Surgeon: Kathleene Hazel, MD;  Location: [facility] INVASIVE CV LAB;  Service: Cardiovascular;  Laterality: N/A;  SAVORY DILATION N/A 03/19/2021  Procedure: SAVORY DILATION;  Surgeon: Jeani Hawking, MD;  Location: [facility] ENDOSCOPY;  Service: Endoscopy;  Laterality: N/A;  SPHINCTEROTOMY  12/28/2021  Procedure: SPHINCTEROTOMY;  Surgeon: Jeani Hawking, MD;  Location: [facility] ENDOSCOPY;  Service: Gastroenterology;;  TEE WITHOUT CARDIOVERSION N/A 01/03/2017  Procedure: TRANSESOPHAGEAL ECHOCARDIOGRAM (TEE);  Surgeon: Kathleene Hazel, MD;  Location: [facility] OR;  Service: Open Heart Surgery;  Laterality: N/A;  TONSILLECTOMY    TRANSCATHETER AORTIC VALVE REPLACEMENT, TRANSFEMORAL N/A 01/03/2017  Procedure: TRANSCATHETER AORTIC VALVE REPLACEMENT, TRANSFEMORAL;  Surgeon: Kathleene Hazel, MD;  Location: [facility] OR;  Service: Open Heart Surgery;  Laterality: N/A;  TUMOR REMOVAL   Kerrie Pleasure, MA, CCC-SLP Acute Rehabilitation Services Office: [phone] 10/20/2022, 12:35 PM  CT ANGIO HEAD NECK W WO CM  Result Date: 10/19/2022 CLINICAL DATA:  Fall, possible subacute infarct on same day CT head EXAM: CT ANGIOGRAPHY HEAD AND NECK WITH AND WITHOUT CONTRAST TECHNIQUE: Multidetector CT imaging of the head and neck was performed using the standard protocol during bolus administration of intravenous contrast. Multiplanar CT image reconstructions and MIPs were obtained to evaluate the vascular anatomy. Carotid  stenosis measurements (when applicable) are obtained utilizing NASCET criteria, using the distal internal carotid diameter as the denominator. RADIATION DOSE REDUCTION: This exam was performed according to the departmental dose-optimization program which includes automated exposure control, adjustment of the mA and/or kV according to patient size and/or use of iterative reconstruction technique. CONTRAST:  75mL OMNIPAQUE IOHEXOL 350 MG/ML SOLN COMPARISON:  10/18/2022 CT head FINDINGS: CT HEAD FINDINGS Brain: Redemonstrated ill-defined hypodensity in the right posterior MCA territory. Hypodensity is also noted in the left cerebellum (series 5, image 8). No evidence of acute hemorrhage, mass, mass effect, or midline shift.  No hydrocephalus or extra-axial fluid collection. Periventricular white matter changes, likely the sequela of chronic small vessel ischemic disease. Vascular: No hyperdense vessel. Atherosclerotic calcifications in the intracranial carotid and vertebral arteries. Skull: Negative for fracture or focal lesion. Sinuses/Orbits: Mucous retention cysts in the maxillary sinuses. Status post bilateral lens replacements. Other: The mastoid air cells are well aerated. CTA NECK FINDINGS Evaluation is somewhat limited by motion artifact. Aortic arch: Standard branching. Imaged portion shows no evidence of aneurysm or dissection. Aortic atherosclerosis, which extends into the arch vessels and causes 80% stenosis at the origin of the left subclavian artery (series 11, image 306). Right carotid system: No evidence of dissection, occlusion, or hemodynamically significant stenosis (greater than 50%), although evaluation of the mid to distal right ICA is significantly motion limited. Left carotid system: No evidence of dissection, occlusion, or hemodynamically significant stenosis (greater than 50%). Vertebral arteries: No evidence of dissection, occlusion, or hemodynamically significant stenosis (greater than 50%).  Skeleton: No acute osseous abnormality. Degenerative changes in the cervical spine. Edentulous. Other neck: Hypoenhancing lesions in the thyroid, which measure up to 8 mm, for which no follow-up is currently indicated. (Reference: J Am Coll Radiol. 2015 Feb;12(2): 143-50) Upper chest: No focal pulmonary opacity or pleural effusion. Right greater than left apical pleuroparenchymal scarring. Emphysema. No pleural effusion. Review of the MIP images confirms the above findings CTA HEAD FINDINGS Anterior circulation: Both internal carotid arteries are patent to the termini, with severe stenosis in the right cavernous segment and left supraclinoid segment, moderate stenosis in the right supraclinoid segment, and mild stenosis in the left cavernous segment. A1 segments patent. Normal anterior communicating artery. Anterior cerebral arteries are patent to their distal aspects without significant stenosis. No M1 stenosis or occlusion. MCA branches perfused to their distal aspects without significant stenosis, although there is somewhat diminished opacification of the posterior MCA branches on the right compared to the left (series 14, image 18). Posterior circulation: Vertebral arteries patent to the vertebrobasilar junction without significant stenosis. Posterior inferior cerebellar arteries patent proximally. Basilar patent to its distal aspect without significant stenosis. Superior cerebellar arteries patent proximally. Patent P1 segments. PCAs perfused to their distal aspects without significant stenosis. The left posterior communicating artery is patent. Venous sinuses: Not well opacified due to phase of contrast. Anatomic variants: None significant. Review of the MIP images confirms the above findings IMPRESSION: 1. Redemonstrated hypodensity in the right posterior MCA territory, which remains concerning for subacute infarct. An MRI is recommended for further evaluation. 2. Hypodensity in the left cerebellum, which  could represent an additional subacute infarct. 3. No intracranial large vessel occlusion. Somewhat diminished opacification of the posterior MCA branches on the right compared to the left without focal stenosis. 4. Severe stenosis in the bilateral cavernous and supraclinoid ICAs, with moderate stenosis in the right supraclinoid segment and mild stenosis in the left cavernous segment. 5. 80% stenosis at the origin the left subclavian artery. No other hemodynamically significant stenosis in the neck, although evaluation of the mid to distal right ICA is limited by motion artifact. 6. Aortic Atherosclerosis (ICD10-I70.0) and Emphysema (ICD10-J43.9). Electronically Signed   By: Wiliam Ke M.D.   On: 10/19/2022 00:19   DG Tibia/Fibula Left  Result Date: 10/18/2022 CLINICAL DATA:  Recent fall with left lower leg pain, initial encounter EXAM: LEFT TIBIA AND FIBULA - 2 VIEW COMPARISON:  None Available. FINDINGS: No acute fracture in the tibia or fibula is noted. No soft tissue abnormality is seen. IMPRESSION: No acute abnormality noted.  Electronically Signed   By: Alcide Clever M.D.   On: 10/18/2022 22:28   CT HEAD WO CONTRAST  Result Date: 10/18/2022 CLINICAL DATA:  Head trauma, minor (Age >= 65y); Neck trauma (Age >= 65y). History of prior strokes EXAM: CT HEAD WITHOUT CONTRAST CT CERVICAL SPINE WITHOUT CONTRAST TECHNIQUE: Multidetector CT imaging of the head and cervical spine was performed following the standard protocol without intravenous contrast. Multiplanar CT image reconstructions of the cervical spine were also generated. RADIATION DOSE REDUCTION: This exam was performed according to the departmental dose-optimization program which includes automated exposure control, adjustment of the mA and/or kV according to patient size and/or use of iterative reconstruction technique. COMPARISON:  CT angiography chest 10/12/2022 FINDINGS: CT HEAD FINDINGS Brain: Cerebral ventricle sizes are concordant with the  degree of cerebral volume loss. Patchy and confluent areas of decreased attenuation are noted throughout the deep and periventricular white matter of the cerebral hemispheres bilaterally, compatible with chronic microvascular ischemic disease. Right parietotemporal and occipital lobe loss of gray-white matter differentiation. No parenchymal hemorrhage. No mass lesion. No extra-axial collection. No mass effect or midline shift. No hydrocephalus. Basilar cisterns are patent. Vascular: No hyperdense vessel. Atherosclerotic calcifications are present within the cavernous internal carotid and vertebral arteries. Skull: No acute fracture or focal lesion. Sinuses/Orbits: Paranasal sinuses and mastoid air cells are clear. Right lens replacement. Otherwise the orbits are unremarkable. Other: None. CT CERVICAL SPINE FINDINGS Alignment: Normal. Skull base and vertebrae: Multilevel moderate degenerative changes of the spine. Associated posterior disc osteophyte complex formation at the C3-C4 and C5-C6 levels. Associated multilevel moderate osseous neural foraminal stenosis. No acute fracture. No aggressive appearing focal osseous lesion or focal pathologic process. Soft tissues and spinal canal: No prevertebral fluid or swelling. No visible canal hematoma. Upper chest: Biapical pleural/pulmonary scarring. Emphysematous changes. Stable chronic 8 mm nodule like left upper lobe pulmonary nodule. Other: Severe atherosclerotic plaque of the aortic arch and its branches. Partially visualized cardiac pacemaker leads. IMPRESSION: 1. Likely subacute right parietotemporal and occipital lobe infarction. 2. No acute intracranial hemorrhage. 3. No acute displaced fracture or traumatic listhesis of the cervical spine. 4. Aortic Atherosclerosis (ICD10-I70.0) and Emphysema (ICD10-J43.9). These results were called by telephone at the time of interpretation on 10/18/2022 at 8:55 pm to provider [facility] , who verbally acknowledged these  results. Electronically Signed   By: Tish Frederickson M.D.   On: 10/18/2022 20:56   CT CERVICAL SPINE WO CONTRAST  Result Date: 10/18/2022 CLINICAL DATA:  Head trauma, minor (Age >= 65y); Neck trauma (Age >= 65y). History of prior strokes EXAM: CT HEAD WITHOUT CONTRAST CT CERVICAL SPINE WITHOUT CONTRAST TECHNIQUE: Multidetector CT imaging of the head and cervical spine was performed following the standard protocol without intravenous contrast. Multiplanar CT image reconstructions of the cervical spine were also generated. RADIATION DOSE REDUCTION: This exam was performed according to the departmental dose-optimization program which includes automated exposure control, adjustment of the mA and/or kV according to patient size and/or use of iterative reconstruction technique. COMPARISON:  CT angiography chest 10/12/2022 FINDINGS: CT HEAD FINDINGS Brain: Cerebral ventricle sizes are concordant with the degree of cerebral volume loss. Patchy and confluent areas of decreased attenuation are noted throughout the deep and periventricular white matter of the cerebral hemispheres bilaterally, compatible with chronic microvascular ischemic disease. Right parietotemporal and occipital lobe loss of gray-white matter differentiation. No parenchymal hemorrhage. No mass lesion. No extra-axial collection. No mass effect or midline shift. No hydrocephalus. Basilar cisterns are patent. Vascular: No hyperdense vessel.  Atherosclerotic calcifications are present within the cavernous internal carotid and vertebral arteries. Skull: No acute fracture or focal lesion. Sinuses/Orbits: Paranasal sinuses and mastoid air cells are clear. Right lens replacement. Otherwise the orbits are unremarkable. Other: None. CT CERVICAL SPINE FINDINGS Alignment: Normal. Skull base and vertebrae: Multilevel moderate degenerative changes of the spine. Associated posterior disc osteophyte complex formation at the C3-C4 and C5-C6 levels. Associated multilevel  moderate osseous neural foraminal stenosis. No acute fracture. No aggressive appearing focal osseous lesion or focal pathologic process. Soft tissues and spinal canal: No prevertebral fluid or swelling. No visible canal hematoma. Upper chest: Biapical pleural/pulmonary scarring. Emphysematous changes. Stable chronic 8 mm nodule like left upper lobe pulmonary nodule. Other: Severe atherosclerotic plaque of the aortic arch and its branches. Partially visualized cardiac pacemaker leads. IMPRESSION: 1. Likely subacute right parietotemporal and occipital lobe infarction. 2. No acute intracranial hemorrhage. 3. No acute displaced fracture or traumatic listhesis of the cervical spine. 4. Aortic Atherosclerosis (ICD10-I70.0) and Emphysema (ICD10-J43.9). These results were called by telephone at the time of interpretation on 10/18/2022 at 8:55 pm to provider [facility] , who verbally acknowledged these results. Electronically Signed   By: Tish Frederickson M.D.   On: 10/18/2022 20:56   DG Chest 2 View  Result Date: 10/18/2022 CLINICAL DATA:  fall EXAM: CHEST - 2 VIEW COMPARISON:  Chest x-ray 10/12/2022, ct chest 10/12/22, cxr 08/22/22 FINDINGS: Left chest wall dual lead pacemaker in grossly appropriate position. The heart and mediastinal contours are within normal limits. Status post aortic valve replacement. Surgical changes overlie the mediastinum. Aortic calcification. Right apical pleural/pulmonary scarring. Stable calcification along the right lower lobe. No focal consolidation. No pulmonary edema. No pleural effusion. No pneumothorax. No acute osseous abnormality. IMPRESSION: 1. No active cardiopulmonary disease. 2.  Aortic Atherosclerosis (ICD10-I70.0). Electronically Signed   By: Tish Frederickson M.D.   On: 10/18/2022 18:45   DG Shoulder Left  Result Date: 10/18/2022 CLINICAL DATA:  Recent fall with left shoulder pain, initial encounter EXAM: LEFT SHOULDER - 2+ VIEW COMPARISON:  None Available. FINDINGS: Mild  degenerative changes of the acromioclavicular joint are seen. No acute fracture or dislocation is noted. The underlying bony thorax appears within normal limits. Pacemaker is seen. IMPRESSION: No acute abnormality noted. Electronically Signed   By: Alcide Clever M.D.   On: 10/18/2022 18:44   DG Hip Unilat W or Wo Pelvis 2-3 Views Left  Result Date: 10/18/2022 CLINICAL DATA:  Recent fall with left hip pain, initial encounter EXAM: DG HIP (WITH OR WITHOUT PELVIS) 3V LEFT COMPARISON:  None Available. FINDINGS: Pelvic ring appears intact. No acute fracture or dislocation is noted. No soft tissue abnormality is seen. IMPRESSION: No acute abnormality noted. Electronically Signed   By: Alcide Clever M.D.   On: 10/18/2022 18:38       Kathlen Mody M.D. [facility] 10/20/2022, 1:39 PM  Available via Epic secure chat 7am-7pm After 7 pm, please refer to night coverage provider listed on amion.

## 2022-10-20 NOTE — Progress Notes (Signed)
Initial Nutrition Assessment  DOCUMENTATION CODES:   Not applicable  INTERVENTION:  Obtain height and weight for this admission Continue current diet as recommended by SLP Ensure Enlive po BID, each supplement provides 350 kcal and 20 grams of protein. MVI with minerals daily  NUTRITION DIAGNOSIS:   Increased nutrient needs related to wound healing as evidenced by estimated needs.  GOAL:   Patient will meet greater than or equal to 90% of their needs  MONITOR:   PO intake, Weight trends, Supplement acceptance, Skin  REASON FOR ASSESSMENT:   Other (Comment) (pressure injury)    ASSESSMENT:   Pt with hx of COPD, GERD, HTN, HLD, CAD s/p CABG, and hx CVA presented to ED with pain to her left side after a fall at home. CT done in ED showed an acute CVA.  5/29 - SLP beside evaluation, DYS 3, thin liquids 5/30 - MBS scheduled for this afternoon.  Unable to reach pt on room phone at this time. Reviewed chart and therapies recommending SNF rehab at discharge. Stage 1 pressure injury present on admission and has been evaluated by WOC. No weight and height recorded from this admission, unable to determine if weight loss has occurred. Will add ensure to augment intake and encourage adequate PO.   Noted that SLP evaluated and plans for an MBS today.   Nutritionally Relevant Medications: Scheduled Meds:  pantoprazole  40 mg Oral BID   rosuvastatin  20 mg Oral Daily   Continuous Infusions:  sodium chloride 75 mL/hr at 10/19/22 1539   cefTRIAXone (ROCEPHIN)  IV Stopped (10/19/22 1942)   Labs Reviewed  NUTRITION - FOCUSED PHYSICAL EXAM: Defer to in-person assessment  Diet Order:   Diet Order             DIET DYS 3 Room service appropriate? Yes; Fluid consistency: Thin  Diet effective now                   EDUCATION NEEDS:  Not appropriate for education at this time  Skin:  Skin Assessment: Reviewed RN Assessment Stage 1: sacrum  Last BM:  5/28  Height:    Ht Readings from Last 1 Encounters:  10/12/22 5\' 2"  (1.575 m)    Weight:   Wt Readings from Last 1 Encounters:  10/12/22 40.9 kg    Ideal Body Weight:  50 kg  BMI:  There is no height or weight on file to calculate BMI.  Estimated Nutritional Needs:   Kcal:  1200-1500 kcal/d  Protein:  55-80 g/d  Fluid:  >/=1.5L/d    Greig Castilla, RD, LDN Clinical Dietitian RD pager # available in [facility]  After hours/weekend pager # available in [facility]

## 2022-10-20 NOTE — Consult Note (Signed)
WOC Nurse Consult Note: Reason for Consult:Patient with partial thickness skin loss (abrasion) to left knee with bruising and Stage 1 unblanchable erythema) to coccyx Wound type: pressure, trauma Pressure Injury POA: Yes Measurement:Bedside RN to measure and document on Nursing Flow Sheet with application of next dressing today Wound ZOX:WRUEAVW thickness skin loss to left knee, red, moist with scant serous exudate Drainage (amount, consistency, odor) As noted above Periwound:intact, dry Dressing procedure/placement/frequency: I have provided guidance for Nursing I accordance with our standing skin care order set, using a silicone foam to the coccyx and covering the left knee injury with antimicrobial nonadherent, xeroform and securing with foam. Heels are to be floated, turning and repositioning is in place, but I have added guidance to minimize time in the supine position. A pressure redistribution chair pad is provided for OOB in chair use and is to be sent home with the patient at time of discharge.  WOC nursing team will not follow, but will remain available to this patient, the nursing and medical teams.  Please re-consult if needed.  Thank you for inviting [place] to participate in this patient's Plan of Care.  Ladona Mow, MSN, RN, CNS, GNP, Leda Min, [employer], [employer] phone:  [phone]

## 2022-10-20 NOTE — Progress Notes (Signed)
Occupational Therapy Treatment Patient Details Name: [name] MRN: [id number] DOB: [birth year] Today's Date: 10/20/2022   History of present illness 89 y.o. female who presented 10/18/22 to the emergency room for evaluation after a fall on the left side. CT head 5/28: "Likely subacute right parietotemporal and occipital lobe  infarction." MRI pending.  Pt with a past medical history of aortic stenosis status post TAVR, coronary artery disease status post CABG, carotid artery disease status post right CEA, hypertension, hyperlipidemia, paroxysmal atrial fibrillation on Eliquis, prior strokes with unclear deficits.   OT comments  Pt with incremental progress towards goals though remains limited by L shoulder discomfort and balance deficits. Pt able to progress standing transfers to min guard with RW though Min A needed for taking steps along bedside. Consistent cues for postural correction and attention to task needed. While pt is close to ADL/transfer baseline, daughter with concerns regarding providing physical assistance due to her own health issues. Pt's daughter requesting consideration of low intensity rehab stay at [facility].   Recommendations for follow up therapy are one component of a multi-disciplinary discharge planning process, led by the attending physician.  Recommendations may be updated based on patient status, additional functional criteria and insurance authorization.    Assistance Recommended at Discharge Frequent or constant Supervision/Assistance  Patient can return home with the following  A little help with walking and/or transfers;A little help with bathing/dressing/bathroom;Assistance with cooking/housework;Direct supervision/assist for medications management;Direct supervision/assist for financial management;Assist for transportation   Equipment Recommendations  None recommended by OT    Recommendations for Other Services      Precautions / Restrictions  Precautions Precautions: Fall Restrictions Weight Bearing Restrictions: No       Mobility Bed Mobility Overal bed mobility: Needs Assistance Bed Mobility: Supine to Sit, Sit to Supine     Supine to sit: Min assist, HOB elevated Sit to supine: Supervision   General bed mobility comments: Min A to scoot hips to EOB, able to swing LE back onto bed without assist    Transfers Overall transfer level: Needs assistance Equipment used: Rolling walker (2 wheels) Transfers: Sit to/from Stand Sit to Stand: Min guard, From elevated surface           General transfer comment: increased time/effort, cues for posture. Min A to sidestep at bedside with assist for RW mgmt and frequent cues for posture correction     Balance Overall balance assessment: Needs assistance Sitting-balance support: Single extremity supported, Feet supported Sitting balance-[name] Scale: Fair     Standing balance support: Bilateral upper extremity supported, During functional activity, Reliant on assistive device for balance Standing balance-[name] Scale: Poor Standing balance comment: Reliant on RW                           ADL either performed or assessed with clinical judgement   ADL Overall ADL's : Needs assistance/impaired                                       General ADL Comments: Emphasis on OOB attempts (though shortened with transport arrival for MRI); discussion with pt/family on current functioning vs at home as daughter is requesting SNF    Extremity/Trunk Assessment Upper Extremity Assessment Upper Extremity Assessment: LUE deficits/detail LUE Deficits / Details: reports shoulder pain since fall. difficulty reaching but able to hold to RW, etc  Lower Extremity Assessment Lower Extremity Assessment: Defer to PT evaluation   Cervical / Trunk Assessment Cervical / Trunk Assessment: Kyphotic    Vision   Vision Assessment?: No apparent visual deficits    Perception     Praxis      Cognition Arousal/Alertness: Awake/alert Behavior During Therapy: WFL for tasks assessed/performed Overall Cognitive Status: History of cognitive impairments - at baseline                                 General Comments: pt likely at baseline, decreased STM, focused/sustained attention, often repeating self. Pt with difficulty following multistep commands, pt following one step commands ~75% of the time, difficult to discern HOH vs cognition.        Exercises      Shoulder Instructions       General Comments discussed d/c and DME options but pt and daughter reporting desire for transport chair instead of w/c and in favor of SNF for rehab as daughter is unsure she can manage pt alone at home due to daughter's bad shoulder    Pertinent Vitals/ Pain       Pain Assessment Pain Assessment: Faces Faces Pain Scale: Hurts a little bit Pain Location: L shoulder Pain Descriptors / Indicators: Sore, Grimacing Pain Intervention(s): Monitored during session  Home Living Family/patient expects to be discharged to:: Private residence Living Arrangements: Children Available Help at Discharge: Family Type of Home: House Home Access: Ramped entrance     Home Layout: One level     Bathroom Shower/Tub: Chief Strategy Officer: Standard Bathroom Accessibility: No   Home Equipment: Agricultural consultant (2 wheels);Cane - single point;BSC/3in1;Other (comment);Transport chair   Additional Comments: pt does not go into bathroom, uses BSC and dtr assists with sponge bathing  Lives With: Daughter    Prior Functioning/Environment              Frequency  Min 2X/week        Progress Toward Goals  OT Goals(current goals can now be found in the care plan section)  Progress towards OT goals: Progressing toward goals  Acute Rehab OT Goals Patient Stated Goal: for L shoulder to feel better OT Goal Formulation: With patient Time For  Goal Achievement: 11/02/22 Potential to Achieve Goals: Good ADL Goals Pt Will Perform Lower Body Dressing: with supervision;sit to/from stand Pt Will Transfer to Toilet: with supervision;stand pivot transfer Additional ADL Goal #1: Pt will demonstrate use of 3 fall prevention strategies wtih minimal cueing.  Plan Discharge plan needs to be updated    Co-evaluation                 AM-PAC OT "6 Clicks" Daily Activity     Outcome Measure   Help from another person eating meals?: None Help from another person taking care of personal grooming?: A Little Help from another person toileting, which includes using toliet, bedpan, or urinal?: A Lot Help from another person bathing (including washing, rinsing, drying)?: A Lot Help from another person to put on and taking off regular upper body clothing?: A Little Help from another person to put on and taking off regular lower body clothing?: A Little 6 Click Score: 17    End of Session Equipment Utilized During Treatment: Rolling walker (2 wheels)  OT Visit Diagnosis: Other abnormalities of gait and mobility (R26.89);Muscle weakness (generalized) (M62.81);History of falling (Z91.81);Other symptoms and signs involving cognitive function  Activity Tolerance Patient tolerated treatment well   Patient Left in bed;with call bell/phone within reach;with family/visitor present;Other (comment) (transport staff)   Nurse Communication          Time: [phone] OT Time Calculation (min): 13 min  Charges: OT General Charges $OT Visit: 1 Visit OT Treatments $Therapeutic Activity: 8-22 mins  Bradd Canary, [name] Acute Rehab Services Office: [phone]   Lorre Munroe 10/20/2022, 12:09 PM

## 2022-10-20 NOTE — Evaluation (Signed)
Physical Therapy Evaluation Patient Details Name: [name] MRN: [id number] DOB: 1929/01/24 Today's Date: 10/20/2022  History of Present Illness  89 y.o. female who presented 10/18/22 to the emergency room for evaluation after a fall on the left side. CT head 5/28: "Likely subacute right parietotemporal and occipital lobe  infarction." MRI pending.  Pt with a past medical history of aortic stenosis status post TAVR, coronary artery disease status post CABG, carotid artery disease status post right CEA, hypertension, hyperlipidemia, paroxysmal atrial fibrillation on Eliquis, prior strokes with unclear deficits.   Clinical Impression  Pt presents with condition above and deficits mentioned below, see PT Problem List. PTA, she was minimally ambulatory, working on ambulating with HHPT, but was capable of performing transfers between surfaces at a supervision level. Pt lives with her daughter in a 1-level house with a ramped entrance. Her daughter has a hurt shoulder and can only assist the pt minimally at this time. At this time, pt is requiring minA for transfers and up to modA to take side steps with RW support. She demonstrates deficits in strength, balance, activity tolerance, and power and is at high risk for subsequent falls. Per daughter, she would prefer the pt become stronger and more independent to reduce caregiver burden before return home. Pt could benefit from short-term inpatient rehab, < 3 hours/day. Will continue to follow acutely.     Recommendations for follow up therapy are one component of a multi-disciplinary discharge planning process, led by the attending physician.  Recommendations may be updated based on patient status, additional functional criteria and insurance authorization.  Follow Up Recommendations Can patient physically be transported by private vehicle: Yes     Assistance Recommended at Discharge Frequent or constant Supervision/Assistance  Patient can return home  with the following  A lot of help with walking and/or transfers;A lot of help with bathing/dressing/bathroom;Assistance with cooking/housework;Assist for transportation;Direct supervision/assist for medications management;Direct supervision/assist for financial management    Equipment Recommendations Other (comment) (if going home - new narrow transport chair with brakes (current one is old and breaking))  Recommendations for Other Services       Functional Status Assessment Patient has had a recent decline in their functional status and demonstrates the ability to make significant improvements in function in a reasonable and predictable amount of time.     Precautions / Restrictions Precautions Precautions: Fall Restrictions Weight Bearing Restrictions: No      Mobility  Bed Mobility Overal bed mobility: Needs Assistance Bed Mobility: Supine to Sit, Sit to Supine     Supine to sit: Min assist, HOB elevated Sit to supine: Min assist, HOB elevated   General bed mobility comments: Slow but able to transition legs off EOB and ascend trunk from elevated HOB to sit L EOB with min guard but needed minA to scoot hips to EOB to get feet to floor. MinA to lift legs onto bed with return to supine    Transfers Overall transfer level: Needs assistance Equipment used: Rolling walker (2 wheels) Transfers: Sit to/from Stand Sit to Stand: Min assist, From elevated surface           General transfer comment: EOB elevated to successfully come to stand x3 reps with minA for power up to stand and balance. Pt occasionally needing multiple attempts before being successful due to feet slipping anteriorly    Ambulation/Gait Ambulation/Gait assistance: Min assist, Mod assist Gait Distance (Feet): 2 Feet Assistive device: Rolling walker (2 wheels) Gait Pattern/deviations: Step-to pattern, Decreased step  length - right, Decreased step length - left, Decreased stride length, Shuffle, Trunk  flexed Gait velocity: reduced Gait velocity interpretation: <1.31 ft/sec, indicative of household ambulator   General Gait Details: Pt with a very flexed posture and only taking small, shuffling side steps along EOB with minA for balance and modA to manage the RW.  Stairs            Wheelchair Mobility    Modified Rankin (Stroke Patients Only) Modified Rankin (Stroke Patients Only) Pre-Morbid Rankin Score: Moderately severe disability Modified Rankin: Moderately severe disability     Balance Overall balance assessment: Needs assistance Sitting-balance support: Single extremity supported, Feet supported Sitting balance-[name] Scale: Fair     Standing balance support: Bilateral upper extremity supported, During functional activity, Reliant on assistive device for balance Standing balance-[name] Scale: Poor Standing balance comment: Reliant on RW                             Pertinent Vitals/Pain Pain Assessment Pain Assessment: Faces Faces Pain Scale: Hurts a little bit Pain Location: L leg and L shoulder Pain Descriptors / Indicators: Sore, Grimacing Pain Intervention(s): Limited activity within patient's tolerance, Monitored during session, Repositioned    Home Living Family/patient expects to be discharged to:: Private residence Living Arrangements: Children Available Help at Discharge: Family Type of Home: House Home Access: Ramped entrance       Home Layout: One level Home Equipment: Agricultural consultant (2 wheels);Cane - single point;BSC/3in1;Other (comment);Transport chair Additional Comments: pt does not go into bathroom, uses BSC and dtr assists with sponge bathing    Prior Function Prior Level of Function : Needs assist       Physical Assist : Mobility (physical);ADLs (physical) Mobility (physical): Transfers;Gait;Stairs ADLs (physical): Bathing;Dressing Mobility Comments: uses transport chair, pt was transferring from [facility] <>chair with  supervision. was working with HHPT to ambulate short distances at home ADLs Comments: pt's daughter assists with ADL primarily bathing and dressing, assists with IADL as well     Hand Dominance   Dominant Hand: Right    Extremity/Trunk Assessment   Upper Extremity Assessment Upper Extremity Assessment: Defer to OT evaluation    Lower Extremity Assessment Lower Extremity Assessment: Generalized weakness (fairly symmetrical weakness noted)    Cervical / Trunk Assessment Cervical / Trunk Assessment: Kyphotic  Communication   Communication: HOH  Cognition Arousal/Alertness: Awake/alert Behavior During Therapy: WFL for tasks assessed/performed Overall Cognitive Status: History of cognitive impairments - at baseline                                 General Comments: pt likely at baseline, decreased STM, focused/sustained attention. Pt with difficulty following multistep commands, pt following one step commands ~75% of the time, difficult to discern HOH vs cognition.        General Comments General comments (skin integrity, edema, etc.): discussed d/c and DME options but pt and daughter reporting desire for transport chair instead of w/c and in favor of SNF for rehab as daughter is unsure she can manage pt alone at home due to daughter's bad shoulder    Exercises     Assessment/Plan    PT Assessment Patient needs continued PT services  PT Problem List Decreased strength;Decreased activity tolerance;Decreased balance;Decreased mobility;Decreased cognition;Decreased coordination       PT Treatment Interventions DME instruction;Gait training;Functional mobility training;Therapeutic activities;Therapeutic exercise;Balance training;Neuromuscular re-education;Cognitive remediation;Patient/family education;Wheelchair  mobility training    PT Goals (Current goals can be found in the Care Plan section)  Acute Rehab PT Goals Patient Stated Goal: to get stronger PT Goal  Formulation: With patient/family Time For Goal Achievement: 11/03/22 Potential to Achieve Goals: Good    Frequency Min 3X/week     Co-evaluation               AM-PAC PT "6 Clicks" Mobility  Outcome Measure Help needed turning from your back to your side while in a flat bed without using bedrails?: A Little Help needed moving from lying on your back to sitting on the side of a flat bed without using bedrails?: A Little Help needed moving to and from a bed to a chair (including a wheelchair)?: A Lot Help needed standing up from a chair using your arms (e.g., wheelchair or bedside chair)?: A Little Help needed to walk in hospital room?: Total Help needed climbing 3-5 steps with a railing? : Total 6 Click Score: 13    End of Session Equipment Utilized During Treatment: Gait belt Activity Tolerance: Patient tolerated treatment well Patient left: in bed;with call bell/phone within reach;with bed alarm set;with family/visitor present   PT Visit Diagnosis: Unsteadiness on feet (R26.81);Other abnormalities of gait and mobility (R26.89);Muscle weakness (generalized) (M62.81);History of falling (Z91.81);Difficulty in walking, not elsewhere classified (R26.2)    Time: 4540-9811 PT Time Calculation (min) (ACUTE ONLY): 36 min   Charges:   PT Evaluation $PT Eval Moderate Complexity: 1 Mod PT Treatments $Therapeutic Activity: 8-22 mins        Raymond Gurney, PT, DPT Acute Rehabilitation Services  Office: [phone]   Jewel Baize 10/20/2022, 11:31 AM

## 2022-10-21 ENCOUNTER — Other Ambulatory Visit: Payer: Self-pay

## 2022-10-21 DIAGNOSIS — R279 Unspecified lack of coordination: Secondary | ICD-10-CM | POA: Diagnosis not present

## 2022-10-21 DIAGNOSIS — R2681 Unsteadiness on feet: Secondary | ICD-10-CM | POA: Diagnosis not present

## 2022-10-21 DIAGNOSIS — J449 Chronic obstructive pulmonary disease, unspecified: Secondary | ICD-10-CM | POA: Diagnosis not present

## 2022-10-21 DIAGNOSIS — I480 Paroxysmal atrial fibrillation: Secondary | ICD-10-CM

## 2022-10-21 DIAGNOSIS — I639 Cerebral infarction, unspecified: Secondary | ICD-10-CM | POA: Diagnosis not present

## 2022-10-21 DIAGNOSIS — M6281 Muscle weakness (generalized): Secondary | ICD-10-CM | POA: Diagnosis not present

## 2022-10-21 DIAGNOSIS — Z7401 Bed confinement status: Secondary | ICD-10-CM | POA: Diagnosis not present

## 2022-10-21 DIAGNOSIS — Z741 Need for assistance with personal care: Secondary | ICD-10-CM | POA: Diagnosis not present

## 2022-10-21 DIAGNOSIS — F4320 Adjustment disorder, unspecified: Secondary | ICD-10-CM | POA: Diagnosis not present

## 2022-10-21 DIAGNOSIS — Z9181 History of falling: Secondary | ICD-10-CM | POA: Diagnosis not present

## 2022-10-21 DIAGNOSIS — R4781 Slurred speech: Secondary | ICD-10-CM | POA: Diagnosis not present

## 2022-10-21 DIAGNOSIS — Z8673 Personal history of transient ischemic attack (TIA), and cerebral infarction without residual deficits: Secondary | ICD-10-CM | POA: Diagnosis not present

## 2022-10-21 DIAGNOSIS — I69311 Memory deficit following cerebral infarction: Secondary | ICD-10-CM | POA: Diagnosis not present

## 2022-10-21 DIAGNOSIS — J45909 Unspecified asthma, uncomplicated: Secondary | ICD-10-CM | POA: Diagnosis not present

## 2022-10-21 DIAGNOSIS — I69391 Dysphagia following cerebral infarction: Secondary | ICD-10-CM | POA: Diagnosis not present

## 2022-10-21 DIAGNOSIS — E876 Hypokalemia: Secondary | ICD-10-CM | POA: Diagnosis not present

## 2022-10-21 DIAGNOSIS — I4819 Other persistent atrial fibrillation: Secondary | ICD-10-CM | POA: Diagnosis not present

## 2022-10-21 DIAGNOSIS — Z681 Body mass index (BMI) 19 or less, adult: Secondary | ICD-10-CM | POA: Diagnosis not present

## 2022-10-21 DIAGNOSIS — N3000 Acute cystitis without hematuria: Secondary | ICD-10-CM | POA: Diagnosis not present

## 2022-10-21 DIAGNOSIS — I5022 Chronic systolic (congestive) heart failure: Secondary | ICD-10-CM

## 2022-10-21 DIAGNOSIS — R41841 Cognitive communication deficit: Secondary | ICD-10-CM | POA: Diagnosis not present

## 2022-10-21 DIAGNOSIS — K219 Gastro-esophageal reflux disease without esophagitis: Secondary | ICD-10-CM

## 2022-10-21 DIAGNOSIS — I69828 Other speech and language deficits following other cerebrovascular disease: Secondary | ICD-10-CM | POA: Diagnosis not present

## 2022-10-21 DIAGNOSIS — G459 Transient cerebral ischemic attack, unspecified: Secondary | ICD-10-CM | POA: Diagnosis not present

## 2022-10-21 DIAGNOSIS — Z719 Counseling, unspecified: Secondary | ICD-10-CM | POA: Diagnosis not present

## 2022-10-21 DIAGNOSIS — L24A2 Irritant contact dermatitis due to fecal, urinary or dual incontinence: Secondary | ICD-10-CM | POA: Diagnosis not present

## 2022-10-21 DIAGNOSIS — M6259 Muscle wasting and atrophy, not elsewhere classified, multiple sites: Secondary | ICD-10-CM | POA: Diagnosis not present

## 2022-10-21 DIAGNOSIS — R2689 Other abnormalities of gait and mobility: Secondary | ICD-10-CM | POA: Diagnosis not present

## 2022-10-21 LAB — URINE CULTURE: Culture: >=100000 — AB

## 2022-10-21 LAB — GLUCOSE, CAPILLARY: Glucose-Capillary: 100 mg/dL — ABNORMAL HIGH (ref 70–99)

## 2022-10-21 LAB — CULTURE, BLOOD (ROUTINE X 2): Culture: NO GROWTH

## 2022-10-21 MED ORDER — CEPHALEXIN 500 MG PO CAPS: 500.0000 mg | ORAL_CAPSULE | Freq: Two times a day (BID) | ORAL | 0 refills | Status: AC

## 2022-10-21 MED ORDER — ADULT MULTIVITAMIN W/MINERALS CH: 1.0000 | ORAL_TABLET | Freq: Every day | ORAL | Status: DC

## 2022-10-21 MED ORDER — CEPHALEXIN 500 MG PO CAPS
500.0000 mg | ORAL_CAPSULE | Freq: Two times a day (BID) | ORAL | Status: DC
  Administered 2022-10-21: 500 mg via ORAL
  Filled 2022-10-21: qty 1

## 2022-10-21 NOTE — Care Management Important Message (Signed)
Important Message  Patient Details  Name: GENET FISCHL MRN: [id number] Date of Birth: 1929/04/07   Medicare Important Message Given:  Yes  Patient left prior to IM delivery will mail a copy of the IM to the patient home address.    Joleah Kosak 10/21/2022, 4:07 PM

## 2022-10-21 NOTE — Discharge Summary (Signed)
Physician Discharge Summary   Patient: [name] MRN: [id number] DOB: 1928-08-09  Admit date:     10/18/2022  Discharge date: 10/21/22  Discharge Physician: Kathlen Mody   PCP: Ronnald Nian, MD   Recommendations at discharge:  Please follow up with PCP, in one week.  Please follow up with palliative care services on discharge.   Discharge Diagnoses: Principal Problem:   CVA (cerebral vascular accident) [facility]) Active Problems:   Dyslipidemia   Hx of CABG 2006 with Maze   Chronic diastolic CHF (congestive heart failure) (HCC)   Status post transcatheter aortic valve replacement (TAVR) using bioprosthesis   Hypokalemia   Persistent atrial fibrillation (HCC)   COPD (chronic obstructive pulmonary disease) (HCC)   History of stroke   UTI (urinary tract infection)   Fall at home, initial encounter   [facility] Course: 89 y.o. female With a past medical history of aortic stenosis status post TAVR, coronary artery disease status post CABG, carotid artery disease status post right CEA, hypertension, hyperlipidemia, paroxysmal atrial fibrillation on Eliquis, prior strokes with unclear deficits presented to the emergency room for evaluation after a fall on the left side.   Assessment and Plan:  Subacute right posterior MCA territory infarct:  Hypodensity in the left cerebellum suspicious for subacute infarction  In the setting of PAF on eliquis.  MRI brain  showed chronic encephalomalacia on the right temporo parietal area.  Last echocardiogram 08/2022 shows LVEF of 45 to 50%, with global hypokinesis, with indeterminate diastolic parameters. Neurology on board , recommended no further work up.  LDL is 66.  Continue with Eliquis, crestor 20 mg daily.  SLP/PT/OT evaluations ordered recommending SNF.        UTI  Urine cultures showing E coli , empirically on IV rocephin, transitioned to oral keflex.      CAD s/p CABG No chest pain.      Carotid artery disease s/p  right CEA;  Outpatient follow up with vascular surgery as recommended.      PAF;  Rate controlled with amiodarone and on Eliquis for anti coagulation.       Hypokalemia  Replaced.    Deconditioning / fall on the left side Bruising over the leg, ankle joint and shoulder.  Pain control and therapy evaluations recommending SNF.      RN Pressure Injury Documentation:     Pressure Injury 04/22/20 Sacrum Medial Deep Tissue Pressure Injury - Purple or maroon localized area of discolored intact skin or blood-filled blister due to damage of underlying soft tissue from pressure and/or shear. (Active)  04/22/20 2230  Location: Sacrum  Location Orientation: Medial  Staging: Deep Tissue Pressure Injury - Purple or maroon localized area of discolored intact skin or blood-filled blister due to damage of underlying soft tissue from pressure and/or shear.  Wound Description (Comments):   Present on Admission: Yes     Pressure Injury 02/19/22 Coccyx Medial;Posterior Stage 1 -  Intact skin with non-blanchable redness of a localized area usually over a bony prominence. (Active)  02/19/22 0030  Location: Coccyx  Location Orientation: Medial;Posterior  Staging: Stage 1 -  Intact skin with non-blanchable redness of a localized area usually over a bony prominence.  Wound Description (Comments):   Present on Admission: Yes     Pressure Injury 10/19/22 Sacrum Stage 1 -  Intact skin with non-blanchable redness of a localized area usually over a bony prominence. non blanchable redness on sacrum (Active)  10/19/22 0835  Location: Sacrum  Location Orientation:   Staging: Stage 1 -  Intact skin with non-blanchable redness of a localized area usually over a bony prominence.  Wound Description (Comments): non blanchable redness on sacrum  Present on Admission: Yes  Dressing Type Foam - Lift dressing to assess site every shift 10/20/22 0715        Estimated body mass index is 16.49 kg/m as calculated  from the following:   Height as of 10/12/22: 5\' 2"  (1.575 m).   Weight as of 10/12/22: 40.9 kg.    Consultants: neurology Procedures performed: MRI BRAIN.  Disposition: Skilled nursing facility Diet recommendation:  Cardiac diet DISCHARGE MEDICATION: Allergies as of 10/21/2022       Reactions   Other Other (See Comments)   NO ACIDIC, TART< OR SPICY FOODS- DEVELOPS REFLUX OFTEN!! PATIENT HAS TROUBLE SWALLOWING TABLETS!!   Bactrim [sulfamethoxazole-trimethoprim] Other (See Comments)   "makes her feel funny" or "unsteady"   Amitriptyline Hcl Rash   Aspirin Hives, Swelling, Rash, Other (See Comments)   Body became swollen   Tape Other (See Comments)   SKIN IS VERY THIN- WILL TEAR EASILY!!   Zetia [ezetimibe] Rash        Medication List     STOP taking these medications    doxycycline 100 MG capsule Commonly known as: VIBRAMYCIN   furosemide 20 MG tablet Commonly known as: LASIX   predniSONE 20 MG tablet Commonly known as: DELTASONE       TAKE these medications    acetaminophen 650 MG CR tablet Commonly known as: TYLENOL Take 650-1,300 mg by mouth 2 (two) times daily as needed for pain.   amiodarone 200 MG tablet Commonly known as: PACERONE Take 1 tablet (200 mg total) by mouth daily.   apixaban 2.5 MG Tabs tablet Commonly known as: Eliquis Take 1 tablet (2.5 mg total) by mouth 2 (two) times daily.   BENEFIBER PO Take 1 Scoop by mouth daily as needed (fiber).   cephALEXin 500 MG capsule Commonly known as: KEFLEX Take 1 capsule (500 mg total) by mouth every 12 (twelve) hours for 5 days.   docusate sodium 100 MG capsule Commonly known as: COLACE Take 100 mg by mouth daily.   Ensure Take 237 mLs by mouth every other day.   EYE DROPS OP Apply 2 drops to eye daily as needed (gritty eyes).   fluticasone 220 MCG/ACT inhaler Commonly known as: FLOVENT HFA Inhale into the lungs 2 (two) times daily.   hydrOXYzine 10 MG tablet Commonly known as:  ATARAX Take 1 tablet (10 mg total) by mouth 3 (three) times daily as needed for anxiety.   Linzess 72 MCG capsule Generic drug: linaclotide Take 1 capsule (72 mcg total) by mouth daily.   loratadine 10 MG tablet Commonly known as: CLARITIN Take 1 tablet (10 mg total) by mouth daily.   multivitamin with minerals Tabs tablet Take 1 tablet by mouth daily. Start taking on: October 22, 2022   nitroGLYCERIN 0.4 MG SL tablet Commonly known as: NITROSTAT DISSOLVE 1 TABLET UNDER THE TONGUE EVERY 5 MINUTES AS NEEDED FOR CHEST PAIN   pantoprazole 40 MG tablet Commonly known as: PROTONIX Take 1 tablet (40 mg total) by mouth 2 (two) times daily.   rosuvastatin 20 MG tablet Commonly known as: CRESTOR TAKE 1 TABLET(20 MG) BY MOUTH DAILY What changed: See the new instructions.   Spacer/Aero-Holding [employer] Use as directed   Ventolin HFA 108 (90 Base) MCG/ACT inhaler Generic drug: albuterol INHALE 2 PUFFS INTO THE LUNGS  EVERY 6 HOURS AS NEEDED FOR WHEEZING OR SHORTNESS OF BREATH What changed:  how much to take how to take this when to take this reasons to take this additional instructions   Vitamin D-3 25 MCG (1000 UT) Caps Take 1,000 Units by mouth daily.               Discharge Care Instructions  (From admission, onward)           Start     Ordered   10/21/22 0000  Discharge wound care:       Comments: Wound care to left knee partial thickness skin loss:  Cleanse with NS, pat dry. Apply xeroform [employer] [place] # [phone]), top with silicone foam. Change xeroform daily, may reuse silicone foam for up to 3 days. Change PRN rolling of dressing edges   10/21/22 0919            Contact information for follow-up providers     Care, [facility] Follow up.   Specialty: Home Health Services Why: The home health agency will contact you for the next home visit. Contact information: [address] [place] [address] [phone]         Ronnald Nian, MD. Schedule an appointment as soon as possible for a visit in 1 week(s).   Specialty: Family Medicine Contact information: [address] [place] [address] [phone]              Contact information for after-discharge care     Destination     [facility] Preferred SNF .   Service: Skilled Nursing Contact information: [address] [place] [address] [phone]                    Discharge Exam: There were no vitals filed for this visit. General exam: Appears calm and comfortable  Respiratory system: Clear to auscultation. Respiratory effort normal. Cardiovascular system: S1 & S2 heard, RRR.  Gastrointestinal system: Abdomen is nondistended, soft and nontender.  Central nervous system: Alert and oriented to place and person.  Extremities: Symmetric 5 x 5 power. Skin: No rashes, Psychiatry: CALM.    Condition at discharge: fair  The results of significant diagnostics from this hospitalization (including imaging, microbiology, ancillary and laboratory) are listed below for reference.   Imaging Studies: MR BRAIN WO CONTRAST  Result Date: 10/20/2022 CLINICAL DATA:  Stroke follow-up. Mental status change, unknown cause. EXAM: MRI HEAD WITHOUT CONTRAST TECHNIQUE: Multiplanar, multiecho pulse sequences of the brain and surrounding structures were obtained without intravenous contrast. COMPARISON:  Head CT Oct 18, 2022. FINDINGS: The study is partially degraded by motion. Brain: No acute infarction, hemorrhage, hydrocephalus, extra-axial collection or mass lesion. Large area of encephalomalacia and gliosis with hemosiderin deposit involving the right temporoparietal occipital region, corresponding to the hypodense area described recent CT. Small chronic infarcts are also seen in the left frontal lobe, bilateral centrum semiovale, left corona radiata the and left cerebellar hemisphere. Scattered and confluent  foci of T2 hyperintensity are seen within the white matter of the cerebral hemispheres, nonspecific, most likely related to chronic small vessel ischemia. Mild parenchymal volume loss. Vascular: Normal flow voids. Skull and upper cervical spine: Normal marrow signal. Sinuses/Orbits: Mucous retention cyst in the right maxillary sinus. Right lens surgery. Other: None. IMPRESSION: 1. No acute intracranial abnormality. 2. Large area of encephalomalacia and gliosis with hemosiderin deposit involving the right temporoparietal occipital region, corresponding to the hypodense area described  recent CT. 3. Small chronic infarcts in the left frontal lobe, bilateral centrum semiovale, left corona radiata and left cerebellar hemisphere. 4. Moderate chronic microvascular ischemic changes of the white matter. Electronically Signed   By: Baldemar Lenis M.D.   On: 10/20/2022 14:40   DG Swallowing Func-Speech Pathology  Result Date: 10/20/2022 Table formatting from the original result was not included. Images from the original result were not included. Modified Barium Swallow Study Patient Details Name: [name] MRN: [id number] Date of Birth: 1928-10-03 Today's Date: 10/20/2022 HPI/PMH: HPI: 89 y.o. female who presented to the emergency room for evaluation after a fall on the left side. CT head 5/28: "Likely subacute right parietotemporal and occipital lobe  infarction." MRI pending.  Pt with a past medical history of aortic stenosis status post TAVR, coronary artery disease status post CABG, carotid artery disease status post right CEA, hypertension, hyperlipidemia, paroxysmal atrial fibrillation on Eliquis, prior strokes with unclear deficits. Clinical Impression: Clinical Impression: Pt presents with functional oropharyngeal swallowing.  There was no penetration or aspiration of any consistencies trialed. During bolus hold, there was premature spillage to the pyriforms with pt inititing swallow prior to any  penetration occuring. During pill simulation there was brief vallecula stasis of tablet with dry swallow.  This cleared with liquid wash.  There was esophageal stasis of tablet on esophageal sweep.  With additional liquid bolus trials x3 the pill cleared the esophagus.  There is the appereance of possible cervical osteophytes, which did not impede bolus flow, but may contribute to the sensating of pt having difficult getting things down.  Additionally, there appeared to be some calcification around the level of the vocal folds (see image below), which was unrelated to swallow function.  Pt is safe to continue up to regular texture diet with thin liquids.  Recommend continuing HH ST to reinforce strategies. If symptoms persist and are bothersome, consider GI referral for further esophageal assessment.  Pt has no further acute ST needs.  SLP will sign off.   Recommend continuing mechanical soft diet with thin liquids. Possible cervical osteophytes and calcification Factors that may increase risk of adverse event in presence of aspiration Rubye Oaks & Clearance Coots 2021): No data recorded Recommendations/Plan: Swallowing Evaluation Recommendations Swallowing Evaluation Recommendations Recommendations: PO diet PO Diet Recommendation: Dysphagia 3 (Mechanical soft); Thin liquids (Level 0) Liquid Administration via: Cup; Straw Medication Administration: Whole meds with liquid Supervision: Intermittent supervision/cueing for swallowing strategies Swallowing strategies  : Follow solids with liquids; Small bites/sips; Slow rate Postural changes: Position pt fully upright for meals; Stay upright 30-60 min after meals Oral care recommendations: Oral care BID (2x/day) Recommended consults: Consider GI consultation (possibly OP, if syptoms persist and are bothersome) Treatment Plan Treatment Plan Treatment recommendations: No treatment recommended at this time Follow-up recommendations: Home health SLP Functional status assessment:  Patient has not had a recent decline in their functional status. Treatment frequency: -- (N/A) Recommendations Recommendations for follow up therapy are one component of a multi-disciplinary discharge planning process, led by the attending physician.  Recommendations may be updated based on patient status, additional functional criteria and insurance authorization. Assessment: Orofacial Exam: Orofacial Exam Oral Cavity - Dentition: Dentures, top; Dentures, bottom Orofacial Anatomy: WFL Oral Motor/Sensory Function: -- (see BSE) Anatomy: No data recorded Boluses Administered: Boluses Administered Boluses Administered: Thin liquids (Level 0); Mildly thick liquids (Level 2, nectar thick); Moderately thick liquids (Level 3, honey thick); Puree; Solid  Oral Impairment Domain: Oral Impairment Domain Lip Closure: No labial escape  Tongue control during bolus hold: Posterior escape of less than half of bolus Bolus preparation/mastication: Timely and efficient chewing and mashing Bolus transport/lingual motion: Brisk tongue motion Oral residue: Complete oral clearance Location of oral residue : N/A Initiation of pharyngeal swallow : Pyriform sinuses  Pharyngeal Impairment Domain: Pharyngeal Impairment Domain Soft palate elevation: No bolus between soft palate (SP)/pharyngeal wall (PW) Laryngeal elevation: Partial superior movement of thyroid cartilage/partial approximation of arytenoids to epiglottic petiole Anterior hyoid excursion: Partial anterior movement Epiglottic movement: Complete inversion Laryngeal vestibule closure: Complete, no air/contrast in laryngeal vestibule Pharyngeal stripping wave : Present - diminished Pharyngeal contraction (A/P view only): N/A Pharyngoesophageal segment opening: Complete distension and complete duration, no obstruction of flow Tongue base retraction: Trace column of contrast or air between tongue base and PPW Pharyngeal residue: Trace residue within or on pharyngeal structures Location  of pharyngeal residue: Valleculae; Pyriform sinuses  Esophageal Impairment Domain: Esophageal Impairment Domain Esophageal clearance upright position: Esophageal retention Pill: Esophageal Impairment Domain Esophageal clearance upright position: Esophageal retention Penetration/Aspiration Scale Score: Penetration/Aspiration Scale Score 1.  Material does not enter airway: Thin liquids (Level 0); Mildly thick liquids (Level 2, nectar thick); Moderately thick liquids (Level 3, honey thick); Puree; Solid; Pill Compensatory Strategies: Compensatory Strategies Compensatory strategies: Yes Other(comment): Effective Effective Other(comment): Pill (follow with additional liquid boluses)   General Information: No data recorded Diet Prior to this Study: Dysphagia 3 (mechanical soft); Thin liquids (Level 0)   No data recorded  No data recorded  No data recorded  No data recorded Behavior/Cognition: Alert; Cooperative; Pleasant mood No data recorded No data recorded No data recorded Volitional Swallow: Able to elicit No data recorded Goal Planning: Prognosis for improved oropharyngeal function: Good No data recorded No data recorded Patient/Family Stated Goal: c/o difficulty swallowing Consulted and agree with results and recommendations: Patient; Family member/caregiver Pain: Pain Assessment Pain Assessment: Faces Faces Pain Scale: 2 Pain Location: L shoulder Pain Descriptors / Indicators: Sore; Grimacing Pain Intervention(s): Monitored during session End of Session: Start Time:SLP Start Time (ACUTE ONLY): 1213 Stop Time: SLP Stop Time (ACUTE ONLY): 1225 Time Calculation:SLP Time Calculation (min) (ACUTE ONLY): 12 min Charges: SLP Evaluations $ SLP Speech Visit: 1 Visit SLP Evaluations $BSS Swallow: 1 Procedure $MBS Swallow: 1 Procedure $ SLP EVAL LANGUAGE/SOUND PRODUCTION: 1 Procedure SLP visit diagnosis: SLP Visit Diagnosis: Dysphagia, unspecified (R13.10) Past Medical History: Past Medical History: Diagnosis Date  Anxiety    Aortic Stenosis s/p TAVR   Echo 10/21: EF 55-60, no RWMA, mild asymmetric LVH, normal RVSF, RVSP 40.2 mmHg, mild MR, s/p TAVR with mean gradient 8.65mmHg, no PVL  Arthritis   OSTEO  Aspirin allergy   on Plavix  Asthma   CAD (coronary artery disease)   a. s/p CABG 2006 with Cox-Maze procedure.  Carotid artery disease (HCC)   a. s/p R CEA.  Diastolic dysfunction   Eczema   Fibromyalgia   GERD (gastroesophageal reflux disease)   Hiatal hernia   Hyperlipidemia   Hypertension   Kyphoscoliosis   Mitral regurgitation   PAF (paroxysmal atrial fibrillation) (HCC)   a. pt has h/o hematuria on Eliquis and has since refused anticoagulation  Pneumonia 2015 ?  Pulmonary regurgitation   Skin cancer of arm   Stroke [facility])   Tricuspid regurgitation  Past Surgical History: Past Surgical History: Procedure Laterality Date  ABDOMINAL HYSTERECTOMY  1976  CAROTID ENDARTERECTOMY  2010  CORONARY ANGIOPLASTY WITH STENT PLACEMENT    CORONARY ARTERY BYPASS GRAFT  01/2005  LIMA-D1; SVG-LAD; SVG-OM; SVG-PDA  ERCP N/A 12/28/2021  Procedure: ENDOSCOPIC RETROGRADE CHOLANGIOPANCREATOGRAPHY (ERCP);  Surgeon: Jeani Hawking, MD;  Location: [facility] ENDOSCOPY;  Service: Gastroenterology;  Laterality: N/A;  ESOPHAGOGASTRODUODENOSCOPY (EGD) WITH PROPOFOL N/A 03/19/2021  Procedure: ESOPHAGOGASTRODUODENOSCOPY (EGD) WITH PROPOFOL;  Surgeon: Jeani Hawking, MD;  Location: [facility] ENDOSCOPY;  Service: Endoscopy;  Laterality: N/A;  EYE SURGERY    MULTIPLE EXTRACTIONS WITH ALVEOLOPLASTY  12/28/2016  Extraction of tooth #'s 2- 5,7-10, 12,13,17-20,and 22-29 with alveoloplasty and maxillary right and left lateral exostoses reductions  MULTIPLE EXTRACTIONS WITH ALVEOLOPLASTY N/A 12/28/2016  Procedure: Extraction of tooth #'s 2- 5,7-10, 12,13,17-20,and 22-29 with alveoloplasty and maxillary right and left lateral exostoses reductions;  Surgeon: Charlynne Pander, DDS;  Location: [facility] OR;  Service: Oral Surgery;  Laterality: N/A;  PACEMAKER IMPLANT N/A 01/15/2021  Procedure: PACEMAKER  IMPLANT;  Surgeon: Regan Lemming, MD;  Location: [facility] INVASIVE CV LAB;  Service: Cardiovascular;  Laterality: N/A;  REMOVAL OF STONES  12/28/2021  Procedure: REMOVAL OF STONES;  Surgeon: Jeani Hawking, MD;  Location: [facility] ENDOSCOPY;  Service: Gastroenterology;;  RIGHT/LEFT HEART CATH AND CORONARY/GRAFT ANGIOGRAPHY N/A 10/27/2016  Procedure: Right/Left Heart Cath and Coronary/Graft Angiography;  Surgeon: Kathleene Hazel, MD;  Location: [facility] INVASIVE CV LAB;  Service: Cardiovascular;  Laterality: N/A;  SAVORY DILATION N/A 03/19/2021  Procedure: SAVORY DILATION;  Surgeon: Jeani Hawking, MD;  Location: [facility] ENDOSCOPY;  Service: Endoscopy;  Laterality: N/A;  SPHINCTEROTOMY  12/28/2021  Procedure: SPHINCTEROTOMY;  Surgeon: Jeani Hawking, MD;  Location: [facility] ENDOSCOPY;  Service: Gastroenterology;;  TEE WITHOUT CARDIOVERSION N/A 01/03/2017  Procedure: TRANSESOPHAGEAL ECHOCARDIOGRAM (TEE);  Surgeon: Kathleene Hazel, MD;  Location: [facility] OR;  Service: Open Heart Surgery;  Laterality: N/A;  TONSILLECTOMY    TRANSCATHETER AORTIC VALVE REPLACEMENT, TRANSFEMORAL N/A 01/03/2017  Procedure: TRANSCATHETER AORTIC VALVE REPLACEMENT, TRANSFEMORAL;  Surgeon: Kathleene Hazel, MD;  Location: [facility] OR;  Service: Open Heart Surgery;  Laterality: N/A;  TUMOR REMOVAL   Kerrie Pleasure, MA, CCC-SLP Acute Rehabilitation Services Office: [phone] 10/20/2022, 12:35 PM  CT ANGIO HEAD NECK W WO CM  Result Date: 10/19/2022 CLINICAL DATA:  Fall, possible subacute infarct on same day CT head EXAM: CT ANGIOGRAPHY HEAD AND NECK WITH AND WITHOUT CONTRAST TECHNIQUE: Multidetector CT imaging of the head and neck was performed using the standard protocol during bolus administration of intravenous contrast. Multiplanar CT image reconstructions and MIPs were obtained to evaluate the vascular anatomy. Carotid stenosis measurements (when applicable) are obtained utilizing NASCET criteria, using the distal internal carotid diameter as the  denominator. RADIATION DOSE REDUCTION: This exam was performed according to the departmental dose-optimization program which includes automated exposure control, adjustment of the mA and/or kV according to patient size and/or use of iterative reconstruction technique. CONTRAST:  75mL OMNIPAQUE IOHEXOL 350 MG/ML SOLN COMPARISON:  10/18/2022 CT head FINDINGS: CT HEAD FINDINGS Brain: Redemonstrated ill-defined hypodensity in the right posterior MCA territory. Hypodensity is also noted in the left cerebellum (series 5, image 8). No evidence of acute hemorrhage, mass, mass effect, or midline shift. No hydrocephalus or extra-axial fluid collection. Periventricular white matter changes, likely the sequela of chronic small vessel ischemic disease. Vascular: No hyperdense vessel. Atherosclerotic calcifications in the intracranial carotid and vertebral arteries. Skull: Negative for fracture or focal lesion. Sinuses/Orbits: Mucous retention cysts in the maxillary sinuses. Status post bilateral lens replacements. Other: The mastoid air cells are well aerated. CTA NECK FINDINGS Evaluation is somewhat limited by motion artifact. Aortic arch: Standard branching. Imaged portion shows no evidence of aneurysm or dissection. Aortic atherosclerosis, which extends into  the arch vessels and causes 80% stenosis at the origin of the left subclavian artery (series 11, image 306). Right carotid system: No evidence of dissection, occlusion, or hemodynamically significant stenosis (greater than 50%), although evaluation of the mid to distal right ICA is significantly motion limited. Left carotid system: No evidence of dissection, occlusion, or hemodynamically significant stenosis (greater than 50%). Vertebral arteries: No evidence of dissection, occlusion, or hemodynamically significant stenosis (greater than 50%). Skeleton: No acute osseous abnormality. Degenerative changes in the cervical spine. Edentulous. Other neck: Hypoenhancing lesions  in the thyroid, which measure up to 8 mm, for which no follow-up is currently indicated. (Reference: J Am Coll Radiol. 2015 Feb;12(2): 143-50) Upper chest: No focal pulmonary opacity or pleural effusion. Right greater than left apical pleuroparenchymal scarring. Emphysema. No pleural effusion. Review of the MIP images confirms the above findings CTA HEAD FINDINGS Anterior circulation: Both internal carotid arteries are patent to the termini, with severe stenosis in the right cavernous segment and left supraclinoid segment, moderate stenosis in the right supraclinoid segment, and mild stenosis in the left cavernous segment. A1 segments patent. Normal anterior communicating artery. Anterior cerebral arteries are patent to their distal aspects without significant stenosis. No M1 stenosis or occlusion. MCA branches perfused to their distal aspects without significant stenosis, although there is somewhat diminished opacification of the posterior MCA branches on the right compared to the left (series 14, image 18). Posterior circulation: Vertebral arteries patent to the vertebrobasilar junction without significant stenosis. Posterior inferior cerebellar arteries patent proximally. Basilar patent to its distal aspect without significant stenosis. Superior cerebellar arteries patent proximally. Patent P1 segments. PCAs perfused to their distal aspects without significant stenosis. The left posterior communicating artery is patent. Venous sinuses: Not well opacified due to phase of contrast. Anatomic variants: None significant. Review of the MIP images confirms the above findings IMPRESSION: 1. Redemonstrated hypodensity in the right posterior MCA territory, which remains concerning for subacute infarct. An MRI is recommended for further evaluation. 2. Hypodensity in the left cerebellum, which could represent an additional subacute infarct. 3. No intracranial large vessel occlusion. Somewhat diminished opacification of the  posterior MCA branches on the right compared to the left without focal stenosis. 4. Severe stenosis in the bilateral cavernous and supraclinoid ICAs, with moderate stenosis in the right supraclinoid segment and mild stenosis in the left cavernous segment. 5. 80% stenosis at the origin the left subclavian artery. No other hemodynamically significant stenosis in the neck, although evaluation of the mid to distal right ICA is limited by motion artifact. 6. Aortic Atherosclerosis (ICD10-I70.0) and Emphysema (ICD10-J43.9). Electronically Signed   By: Wiliam Ke M.D.   On: 10/19/2022 00:19   DG Tibia/Fibula Left  Result Date: 10/18/2022 CLINICAL DATA:  Recent fall with left lower leg pain, initial encounter EXAM: LEFT TIBIA AND FIBULA - 2 VIEW COMPARISON:  None Available. FINDINGS: No acute fracture in the tibia or fibula is noted. No soft tissue abnormality is seen. IMPRESSION: No acute abnormality noted. Electronically Signed   By: Alcide Clever M.D.   On: 10/18/2022 22:28   CT HEAD WO CONTRAST  Result Date: 10/18/2022 CLINICAL DATA:  Head trauma, minor (Age >= 65y); Neck trauma (Age >= 65y). History of prior strokes EXAM: CT HEAD WITHOUT CONTRAST CT CERVICAL SPINE WITHOUT CONTRAST TECHNIQUE: Multidetector CT imaging of the head and cervical spine was performed following the standard protocol without intravenous contrast. Multiplanar CT image reconstructions of the cervical spine were also generated. RADIATION DOSE REDUCTION: This exam was  performed according to the departmental dose-optimization program which includes automated exposure control, adjustment of the mA and/or kV according to patient size and/or use of iterative reconstruction technique. COMPARISON:  CT angiography chest 10/12/2022 FINDINGS: CT HEAD FINDINGS Brain: Cerebral ventricle sizes are concordant with the degree of cerebral volume loss. Patchy and confluent areas of decreased attenuation are noted throughout the deep and periventricular  white matter of the cerebral hemispheres bilaterally, compatible with chronic microvascular ischemic disease. Right parietotemporal and occipital lobe loss of gray-white matter differentiation. No parenchymal hemorrhage. No mass lesion. No extra-axial collection. No mass effect or midline shift. No hydrocephalus. Basilar cisterns are patent. Vascular: No hyperdense vessel. Atherosclerotic calcifications are present within the cavernous internal carotid and vertebral arteries. Skull: No acute fracture or focal lesion. Sinuses/Orbits: Paranasal sinuses and mastoid air cells are clear. Right lens replacement. Otherwise the orbits are unremarkable. Other: None. CT CERVICAL SPINE FINDINGS Alignment: Normal. Skull base and vertebrae: Multilevel moderate degenerative changes of the spine. Associated posterior disc osteophyte complex formation at the C3-C4 and C5-C6 levels. Associated multilevel moderate osseous neural foraminal stenosis. No acute fracture. No aggressive appearing focal osseous lesion or focal pathologic process. Soft tissues and spinal canal: No prevertebral fluid or swelling. No visible canal hematoma. Upper chest: Biapical pleural/pulmonary scarring. Emphysematous changes. Stable chronic 8 mm nodule like left upper lobe pulmonary nodule. Other: Severe atherosclerotic plaque of the aortic arch and its branches. Partially visualized cardiac pacemaker leads. IMPRESSION: 1. Likely subacute right parietotemporal and occipital lobe infarction. 2. No acute intracranial hemorrhage. 3. No acute displaced fracture or traumatic listhesis of the cervical spine. 4. Aortic Atherosclerosis (ICD10-I70.0) and Emphysema (ICD10-J43.9). These results were called by telephone at the time of interpretation on 10/18/2022 at 8:55 pm to provider [facility] , who verbally acknowledged these results. Electronically Signed   By: Tish Frederickson M.D.   On: 10/18/2022 20:56   CT CERVICAL SPINE WO CONTRAST  Result Date:  10/18/2022 CLINICAL DATA:  Head trauma, minor (Age >= 65y); Neck trauma (Age >= 65y). History of prior strokes EXAM: CT HEAD WITHOUT CONTRAST CT CERVICAL SPINE WITHOUT CONTRAST TECHNIQUE: Multidetector CT imaging of the head and cervical spine was performed following the standard protocol without intravenous contrast. Multiplanar CT image reconstructions of the cervical spine were also generated. RADIATION DOSE REDUCTION: This exam was performed according to the departmental dose-optimization program which includes automated exposure control, adjustment of the mA and/or kV according to patient size and/or use of iterative reconstruction technique. COMPARISON:  CT angiography chest 10/12/2022 FINDINGS: CT HEAD FINDINGS Brain: Cerebral ventricle sizes are concordant with the degree of cerebral volume loss. Patchy and confluent areas of decreased attenuation are noted throughout the deep and periventricular white matter of the cerebral hemispheres bilaterally, compatible with chronic microvascular ischemic disease. Right parietotemporal and occipital lobe loss of gray-white matter differentiation. No parenchymal hemorrhage. No mass lesion. No extra-axial collection. No mass effect or midline shift. No hydrocephalus. Basilar cisterns are patent. Vascular: No hyperdense vessel. Atherosclerotic calcifications are present within the cavernous internal carotid and vertebral arteries. Skull: No acute fracture or focal lesion. Sinuses/Orbits: Paranasal sinuses and mastoid air cells are clear. Right lens replacement. Otherwise the orbits are unremarkable. Other: None. CT CERVICAL SPINE FINDINGS Alignment: Normal. Skull base and vertebrae: Multilevel moderate degenerative changes of the spine. Associated posterior disc osteophyte complex formation at the C3-C4 and C5-C6 levels. Associated multilevel moderate osseous neural foraminal stenosis. No acute fracture. No aggressive appearing focal osseous lesion or focal pathologic  process.  Soft tissues and spinal canal: No prevertebral fluid or swelling. No visible canal hematoma. Upper chest: Biapical pleural/pulmonary scarring. Emphysematous changes. Stable chronic 8 mm nodule like left upper lobe pulmonary nodule. Other: Severe atherosclerotic plaque of the aortic arch and its branches. Partially visualized cardiac pacemaker leads. IMPRESSION: 1. Likely subacute right parietotemporal and occipital lobe infarction. 2. No acute intracranial hemorrhage. 3. No acute displaced fracture or traumatic listhesis of the cervical spine. 4. Aortic Atherosclerosis (ICD10-I70.0) and Emphysema (ICD10-J43.9). These results were called by telephone at the time of interpretation on 10/18/2022 at 8:55 pm to provider [facility] , who verbally acknowledged these results. Electronically Signed   By: Tish Frederickson M.D.   On: 10/18/2022 20:56   DG Chest 2 View  Result Date: 10/18/2022 CLINICAL DATA:  fall EXAM: CHEST - 2 VIEW COMPARISON:  Chest x-ray 10/12/2022, ct chest 10/12/22, cxr 08/22/22 FINDINGS: Left chest wall dual lead pacemaker in grossly appropriate position. The heart and mediastinal contours are within normal limits. Status post aortic valve replacement. Surgical changes overlie the mediastinum. Aortic calcification. Right apical pleural/pulmonary scarring. Stable calcification along the right lower lobe. No focal consolidation. No pulmonary edema. No pleural effusion. No pneumothorax. No acute osseous abnormality. IMPRESSION: 1. No active cardiopulmonary disease. 2.  Aortic Atherosclerosis (ICD10-I70.0). Electronically Signed   By: Tish Frederickson M.D.   On: 10/18/2022 18:45   DG Shoulder Left  Result Date: 10/18/2022 CLINICAL DATA:  Recent fall with left shoulder pain, initial encounter EXAM: LEFT SHOULDER - 2+ VIEW COMPARISON:  None Available. FINDINGS: Mild degenerative changes of the acromioclavicular joint are seen. No acute fracture or dislocation is noted. The underlying bony  thorax appears within normal limits. Pacemaker is seen. IMPRESSION: No acute abnormality noted. Electronically Signed   By: Alcide Clever M.D.   On: 10/18/2022 18:44   DG Hip Unilat W or Wo Pelvis 2-3 Views Left  Result Date: 10/18/2022 CLINICAL DATA:  Recent fall with left hip pain, initial encounter EXAM: DG HIP (WITH OR WITHOUT PELVIS) 3V LEFT COMPARISON:  None Available. FINDINGS: Pelvic ring appears intact. No acute fracture or dislocation is noted. No soft tissue abnormality is seen. IMPRESSION: No acute abnormality noted. Electronically Signed   By: Alcide Clever M.D.   On: 10/18/2022 18:38   CUP PACEART REMOTE DEVICE CHECK  Result Date: 10/18/2022 Scheduled remote reviewed. Normal device function.  Known PAF, currently in progress from 5/27, controlled rates Eliquis per PA report Next remote 91 days. LA, CVRS  CT Angio Chest PE W and/or Wo Contrast  Result Date: 10/12/2022 CLINICAL DATA:  Pulmonary embolism (PE) suspected, high prob, dyspnea EXAM: CT ANGIOGRAPHY CHEST WITH CONTRAST TECHNIQUE: Multidetector CT imaging of the chest was performed using the standard protocol during bolus administration of intravenous contrast. Multiplanar CT image reconstructions and MIPs were obtained to evaluate the vascular anatomy. RADIATION DOSE REDUCTION: This exam was performed according to the departmental dose-optimization program which includes automated exposure control, adjustment of the mA and/or kV according to patient size and/or use of iterative reconstruction technique. CONTRAST:  75mL OMNIPAQUE IOHEXOL 350 MG/ML SOLN COMPARISON:  01/16/2021 FINDINGS: Cardiovascular: There is adequate opacification of the pulmonary arterial tree. No intraluminal filling defect identified to suggest acute pulmonary embolism. Central pulmonary arteries are of normal caliber. Coronary artery bypass grafting and transcatheter aortic valve replacement has been performed. Global cardiac size within normal limits. Left  subclavian dual lead pacemaker is seen in place with leads within the right atrium and right ventricle. Extensive atherosclerotic calcifications  seen within the thoracic aorta. No aortic aneurysm. Mediastinum/Nodes: 11 mm nodule within the right thyroid lobe is stable since prior examination, not well characterized on this examination. No pathologic thoracic adenopathy. Esophagus unremarkable. Lungs/Pleura: Biapical pleuroparenchymal scarring. Mean 6 mm pulmonary nodule within the left upper lobe, axial image # 27/7 is stable since prior examination and safely considered benign. Mosaic attenuation of the pulmonary parenchyma is present in keeping with multifocal air trapping likely related to small airways disease. No pneumothorax or pleural effusion. No central obstructing lesion. Upper Abdomen: No acute abnormality. Musculoskeletal: Severe thoracolumbar levoscoliosis. No acute bone abnormality. No lytic or blastic bone lesion. Review of the MIP images confirms the above findings. IMPRESSION: 1. No pulmonary embolism. 2. Mosaic attenuation of the pulmonary parenchyma in keeping with multifocal air trapping likely related to small airways disease. 3. Status post coronary artery bypass grafting and transcatheter aortic valve replacement. 4. Stable 11 mm nodule within the right thyroid lobe, not well characterized on this examination. Not clinically significant; no follow-up imaging recommended (ref: J Am Coll Radiol. 2015 Feb;12(2): 143-50). Aortic Atherosclerosis (ICD10-I70.0). Electronically Signed   By: Helyn Numbers M.D.   On: 10/12/2022 19:55   DG Chest Portable 1 View  Result Date: 10/12/2022 CLINICAL DATA:  Shortness of breath EXAM: PORTABLE CHEST 1 VIEW COMPARISON:  09/06/2022 FINDINGS: Post sternotomy changes and valve prosthesis. Stable left-sided pacing device. Small bilateral effusions. No focal airspace disease. Stable cardiomediastinal silhouette with aortic atherosclerosis. No pneumothorax  IMPRESSION: Small bilateral pleural effusions. No focal airspace disease. Electronically Signed   By: Jasmine Pang M.D.   On: 10/12/2022 18:08    Microbiology: Results for orders placed or performed during the hospital encounter of 10/18/22  Urine Culture     Status: Abnormal   Collection Time: 10/18/22  9:14 PM   Specimen: Urine, Clean Catch  Result Value Ref Range Status   Specimen Description URINE, CLEAN CATCH  Final   Special Requests   Final    NONE Performed at [facility], [address] [place], [place] [address]    Culture >=100,000 COLONIES/mL ESCHERICHIA COLI (A)  Final   Report Status 10/21/2022 FINAL  Final   Organism ID, Bacteria ESCHERICHIA COLI (A)  Final      Susceptibility   Escherichia coli - MIC*    AMPICILLIN 4 SENSITIVE Sensitive     CEFAZOLIN <=4 SENSITIVE Sensitive     CEFEPIME <=0.12 SENSITIVE Sensitive     CEFTRIAXONE <=0.25 SENSITIVE Sensitive     CIPROFLOXACIN <=0.25 SENSITIVE Sensitive     GENTAMICIN <=1 SENSITIVE Sensitive     IMIPENEM <=0.25 SENSITIVE Sensitive     NITROFURANTOIN <=16 SENSITIVE Sensitive     TRIMETH/SULFA <=20 SENSITIVE Sensitive     AMPICILLIN/SULBACTAM <=2 SENSITIVE Sensitive     PIP/TAZO <=4 SENSITIVE Sensitive     * >=100,000 COLONIES/mL ESCHERICHIA COLI  Blood culture (routine x 2)     Status: None (Preliminary result)   Collection Time: 10/18/22  9:30 PM   Specimen: BLOOD  Result Value Ref Range Status   Specimen Description BLOOD SITE NOT SPECIFIED  Final   Special Requests   Final    BOTTLES DRAWN AEROBIC AND ANAEROBIC Blood Culture adequate volume   Culture   Final    NO GROWTH 3 DAYS Performed at [facility], [address], [place], [place] [address]    Report Status PENDING  Incomplete  Blood culture (routine x 2)     Status: None (Preliminary  result)   Collection Time: 10/18/22  9:34 PM   Specimen: BLOOD  Result Value Ref Range Status   Specimen Description BLOOD SITE NOT SPECIFIED  Final    Special Requests   Final    BOTTLES DRAWN AEROBIC AND ANAEROBIC Blood Culture results may not be optimal due to an excessive volume of blood received in culture bottles   Culture   Final    NO GROWTH 3 DAYS Performed at [facility], [address], [place], [place] [address]    Report Status PENDING  Incomplete    Labs: CBC: Recent Labs  Lab 10/18/22 1900 10/19/22 0958  WBC 17.8* 7.8  NEUTROABS 16.0* 6.4  HGB 12.1 11.7*  HCT 37.8 35.6*  MCV 95.0 93.0  PLT 208 184   Basic Metabolic Panel: Recent Labs  Lab 10/18/22 1900 10/19/22 0958  NA 135 140  K 3.2* 3.8  CL 95* 102  CO2 27 25  GLUCOSE 157* 97  BUN 15 9  CREATININE 0.56 0.49  CALCIUM 8.9 8.9   Liver Function Tests: Recent Labs  Lab 10/18/22 1900  AST 32  ALT 26  ALKPHOS 37*  BILITOT 0.6  PROT 5.6*  ALBUMIN 3.2*   CBG: Recent Labs  Lab 10/20/22 1417 10/20/22 1605 10/20/22 1939 10/20/22 2314 10/21/22 0324  GLUCAP 102* 117* 103* 102* 100*    Discharge time spent: 39 minutes.   Signed: Kathlen Mody, MD [facility] 10/21/2022

## 2022-10-21 NOTE — Plan of Care (Signed)
MR brain completed and reviewed. Old area of encephalomalacia on the right (this is what was read as probable subacute stroke but is actually now known with MRI to be chronic), no new stroke. Presentation likely consistent with toxic metabolic encephalopathy (? From UTI) - and not a stroke. Recommend management of UTI per primary team. No further inpatient neurology work up needed at this time. Call inpatient neurology team with questions as needed.  -- Milon Dikes, MD Neurologist [facility] Pager: [phone]

## 2022-10-21 NOTE — TOC Transition Note (Signed)
Transition of Care [facility]) - CM/SW Discharge Note   Patient Details  Name: [name] MRN: [id number] Date of Birth: Aug 14, 1928  Transition [facility] Care [facility] CM/SW Contact:  Kermit Balo, RN Phone Number: 10/21/2022, 10:55 AM   Clinical Narrative:    Pt is discharging to SNF: [facility] for rehab this am. Bed verified with Tanae at [facility]. She will transport via [facility]. Daughter at the bedside and in agreement.   Number for report; [phone]   Final next level of care: Skilled Nursing Facility Barriers to Discharge: No Barriers Identified   Patient Goals and CMS Choice CMS Medicare.gov Compare Post Acute Care list provided to:: Patient Represenative (must comment) Choice offered to / list presented to : Adult Children  Discharge Placement PASRR number recieved: 10/20/22 PASRR number recieved: 10/20/22            Patient [facility] bed at: [facility] Patient to be transferred to facility by: [facility] Name of family member notified: daughter at the bedside Patient and family notified of of transfer: 10/21/22  Discharge Plan and Services Additional resources added to the After Visit Summary for     Discharge Planning Services: CM Consult Post Acute Care Choice: Home Health                    HH Arranged: PT, Speech Therapy HH Agency: [facility] Date [facility] Agency Contacted: 10/19/22   Representative spoke with at [facility] Agency: Kandee Keen  Social Determinants of Health (SDOH) Interventions SDOH Screenings   Food Insecurity: No Food Insecurity (09/06/2022)  Housing: Low Risk  (09/06/2022)  Transportation Needs: No Transportation Needs (09/06/2022)  Utilities: Not At Risk (09/06/2022)  Depression (PHQ2-9): Low Risk  (04/22/2022)  Financial Resource Strain: Low Risk  (04/22/2022)  Physical Activity: Inactive (04/22/2022)  Stress: No Stress Concern Present (04/22/2022)  Tobacco Use: Medium Risk (10/18/2022)     Readmission Risk Interventions    12/30/2021    11:52 AM  Readmission Risk Prevention Plan  Post Dischage Appt Complete  Medication Screening Complete  Transportation Screening Complete

## 2022-10-22 LAB — CULTURE, BLOOD (ROUTINE X 2)

## 2022-10-23 LAB — CULTURE, BLOOD (ROUTINE X 2): Culture: NO GROWTH

## 2022-10-26 ENCOUNTER — Telehealth (HOSPITAL_COMMUNITY): Payer: Self-pay | Admitting: *Deleted

## 2022-10-26 DIAGNOSIS — R2689 Other abnormalities of gait and mobility: Secondary | ICD-10-CM | POA: Diagnosis not present

## 2022-10-26 DIAGNOSIS — I480 Paroxysmal atrial fibrillation: Secondary | ICD-10-CM | POA: Diagnosis not present

## 2022-10-26 DIAGNOSIS — Z9181 History of falling: Secondary | ICD-10-CM | POA: Diagnosis not present

## 2022-10-26 DIAGNOSIS — R41841 Cognitive communication deficit: Secondary | ICD-10-CM | POA: Diagnosis not present

## 2022-10-26 DIAGNOSIS — I639 Cerebral infarction, unspecified: Secondary | ICD-10-CM | POA: Diagnosis not present

## 2022-10-26 DIAGNOSIS — R2681 Unsteadiness on feet: Secondary | ICD-10-CM | POA: Diagnosis not present

## 2022-10-26 DIAGNOSIS — R279 Unspecified lack of coordination: Secondary | ICD-10-CM | POA: Diagnosis not present

## 2022-10-26 DIAGNOSIS — J449 Chronic obstructive pulmonary disease, unspecified: Secondary | ICD-10-CM | POA: Diagnosis not present

## 2022-10-26 DIAGNOSIS — M6281 Muscle weakness (generalized): Secondary | ICD-10-CM | POA: Diagnosis not present

## 2022-10-26 NOTE — Telephone Encounter (Signed)
-----   Message from Alcide Clever sent at 10/26/2022  9:35 AM EDT ----- Regarding: appt Called and spoke with the daughter on 10/26/22 and she stated that she wasn't sure when to make the appt, she told me her mom  had fallen and was getting therapy.

## 2022-11-01 DIAGNOSIS — R2689 Other abnormalities of gait and mobility: Secondary | ICD-10-CM | POA: Diagnosis not present

## 2022-11-01 DIAGNOSIS — Z9181 History of falling: Secondary | ICD-10-CM | POA: Diagnosis not present

## 2022-11-01 DIAGNOSIS — R279 Unspecified lack of coordination: Secondary | ICD-10-CM | POA: Diagnosis not present

## 2022-11-01 DIAGNOSIS — R41841 Cognitive communication deficit: Secondary | ICD-10-CM | POA: Diagnosis not present

## 2022-11-01 DIAGNOSIS — J449 Chronic obstructive pulmonary disease, unspecified: Secondary | ICD-10-CM | POA: Diagnosis not present

## 2022-11-01 DIAGNOSIS — I639 Cerebral infarction, unspecified: Secondary | ICD-10-CM | POA: Diagnosis not present

## 2022-11-01 DIAGNOSIS — I480 Paroxysmal atrial fibrillation: Secondary | ICD-10-CM | POA: Diagnosis not present

## 2022-11-01 DIAGNOSIS — M6281 Muscle weakness (generalized): Secondary | ICD-10-CM | POA: Diagnosis not present

## 2022-11-01 DIAGNOSIS — R2681 Unsteadiness on feet: Secondary | ICD-10-CM | POA: Diagnosis not present

## 2022-11-02 ENCOUNTER — Other Ambulatory Visit: Payer: Self-pay | Admitting: Family Medicine

## 2022-11-02 DIAGNOSIS — I495 Sick sinus syndrome: Secondary | ICD-10-CM

## 2022-11-04 ENCOUNTER — Other Ambulatory Visit: Payer: Self-pay | Admitting: *Deleted

## 2022-11-04 DIAGNOSIS — I639 Cerebral infarction, unspecified: Secondary | ICD-10-CM | POA: Diagnosis not present

## 2022-11-04 DIAGNOSIS — R2689 Other abnormalities of gait and mobility: Secondary | ICD-10-CM | POA: Diagnosis not present

## 2022-11-04 DIAGNOSIS — Z9181 History of falling: Secondary | ICD-10-CM | POA: Diagnosis not present

## 2022-11-04 DIAGNOSIS — R2681 Unsteadiness on feet: Secondary | ICD-10-CM | POA: Diagnosis not present

## 2022-11-04 DIAGNOSIS — I480 Paroxysmal atrial fibrillation: Secondary | ICD-10-CM | POA: Diagnosis not present

## 2022-11-04 DIAGNOSIS — J449 Chronic obstructive pulmonary disease, unspecified: Secondary | ICD-10-CM | POA: Diagnosis not present

## 2022-11-04 DIAGNOSIS — R279 Unspecified lack of coordination: Secondary | ICD-10-CM | POA: Diagnosis not present

## 2022-11-04 DIAGNOSIS — R41841 Cognitive communication deficit: Secondary | ICD-10-CM | POA: Diagnosis not present

## 2022-11-04 DIAGNOSIS — M6281 Muscle weakness (generalized): Secondary | ICD-10-CM | POA: Diagnosis not present

## 2022-11-04 NOTE — Patient Outreach (Signed)
Mrs. Bodine resides in [facility] skilled nursing facility.  Screening for potential [facility] care coordination services as a benefit of health plan and primary care provider.  Collaboration with [facility] therapy Production designer, theatre/television/film. Anticipated transition plan is to return home with daughter.   Will continue to follow.   Raiford Noble, MSN, RN,BSN [facility] Post Acute Care Coordinator [phone] (Direct dial)

## 2022-11-07 DIAGNOSIS — L24A2 Irritant contact dermatitis due to fecal, urinary or dual incontinence: Secondary | ICD-10-CM | POA: Diagnosis not present

## 2022-11-09 ENCOUNTER — Telehealth: Payer: Self-pay

## 2022-11-09 DIAGNOSIS — R279 Unspecified lack of coordination: Secondary | ICD-10-CM | POA: Diagnosis not present

## 2022-11-09 DIAGNOSIS — R41841 Cognitive communication deficit: Secondary | ICD-10-CM | POA: Diagnosis not present

## 2022-11-09 DIAGNOSIS — I639 Cerebral infarction, unspecified: Secondary | ICD-10-CM | POA: Diagnosis not present

## 2022-11-09 DIAGNOSIS — R2689 Other abnormalities of gait and mobility: Secondary | ICD-10-CM | POA: Diagnosis not present

## 2022-11-09 DIAGNOSIS — R2681 Unsteadiness on feet: Secondary | ICD-10-CM | POA: Diagnosis not present

## 2022-11-09 DIAGNOSIS — J449 Chronic obstructive pulmonary disease, unspecified: Secondary | ICD-10-CM | POA: Diagnosis not present

## 2022-11-09 DIAGNOSIS — I480 Paroxysmal atrial fibrillation: Secondary | ICD-10-CM | POA: Diagnosis not present

## 2022-11-09 DIAGNOSIS — Z9181 History of falling: Secondary | ICD-10-CM | POA: Diagnosis not present

## 2022-11-09 DIAGNOSIS — M6281 Muscle weakness (generalized): Secondary | ICD-10-CM | POA: Diagnosis not present

## 2022-11-09 NOTE — Progress Notes (Signed)
   Care Guide Note  11/09/2022 Name: [name] MRN: [id number] DOB: Feb 24, 1929  Referred by: Ronnald Nian, MD Reason for referral : Care Coordination (Outreach to schedule with Pharm d )   [name] is a 89 y.o. year old female who is a primary care patient of Ronnald Nian, MD. [name] was referred to the pharmacist for assistance related to DM.    Successful contact was made with the patient to discuss pharmacy services including being ready for the pharmacist to call at least 5 minutes before the scheduled appointment time, to have medication bottles and any blood sugar or blood pressure readings ready for review. The patient agreed to meet with the pharmacist via with the pharmacist via telephone visit on (date/time).  12/14/2022  [name], RMA Care Guide [facility]  [place], [place] [address] Direct Dial: [phone] [name]

## 2022-11-09 NOTE — Progress Notes (Signed)
Remote pacemaker transmission.   

## 2022-11-14 DIAGNOSIS — L24A2 Irritant contact dermatitis due to fecal, urinary or dual incontinence: Secondary | ICD-10-CM | POA: Diagnosis not present

## 2022-11-15 DIAGNOSIS — R41841 Cognitive communication deficit: Secondary | ICD-10-CM | POA: Diagnosis not present

## 2022-11-15 DIAGNOSIS — M6281 Muscle weakness (generalized): Secondary | ICD-10-CM | POA: Diagnosis not present

## 2022-11-15 DIAGNOSIS — J449 Chronic obstructive pulmonary disease, unspecified: Secondary | ICD-10-CM | POA: Diagnosis not present

## 2022-11-15 DIAGNOSIS — I639 Cerebral infarction, unspecified: Secondary | ICD-10-CM | POA: Diagnosis not present

## 2022-11-15 DIAGNOSIS — R2689 Other abnormalities of gait and mobility: Secondary | ICD-10-CM | POA: Diagnosis not present

## 2022-11-15 DIAGNOSIS — R2681 Unsteadiness on feet: Secondary | ICD-10-CM | POA: Diagnosis not present

## 2022-11-15 DIAGNOSIS — R279 Unspecified lack of coordination: Secondary | ICD-10-CM | POA: Diagnosis not present

## 2022-11-15 DIAGNOSIS — Z9181 History of falling: Secondary | ICD-10-CM | POA: Diagnosis not present

## 2022-11-15 DIAGNOSIS — I480 Paroxysmal atrial fibrillation: Secondary | ICD-10-CM | POA: Diagnosis not present

## 2022-11-18 DIAGNOSIS — R2689 Other abnormalities of gait and mobility: Secondary | ICD-10-CM | POA: Diagnosis not present

## 2022-11-18 DIAGNOSIS — R279 Unspecified lack of coordination: Secondary | ICD-10-CM | POA: Diagnosis not present

## 2022-11-18 DIAGNOSIS — J449 Chronic obstructive pulmonary disease, unspecified: Secondary | ICD-10-CM | POA: Diagnosis not present

## 2022-11-18 DIAGNOSIS — I639 Cerebral infarction, unspecified: Secondary | ICD-10-CM | POA: Diagnosis not present

## 2022-11-18 DIAGNOSIS — I480 Paroxysmal atrial fibrillation: Secondary | ICD-10-CM | POA: Diagnosis not present

## 2022-11-18 DIAGNOSIS — Z9181 History of falling: Secondary | ICD-10-CM | POA: Diagnosis not present

## 2022-11-18 DIAGNOSIS — R2681 Unsteadiness on feet: Secondary | ICD-10-CM | POA: Diagnosis not present

## 2022-11-18 DIAGNOSIS — R41841 Cognitive communication deficit: Secondary | ICD-10-CM | POA: Diagnosis not present

## 2022-11-18 DIAGNOSIS — M6281 Muscle weakness (generalized): Secondary | ICD-10-CM | POA: Diagnosis not present

## 2022-11-21 DIAGNOSIS — L24A2 Irritant contact dermatitis due to fecal, urinary or dual incontinence: Secondary | ICD-10-CM | POA: Diagnosis not present

## 2022-11-23 DIAGNOSIS — J449 Chronic obstructive pulmonary disease, unspecified: Secondary | ICD-10-CM | POA: Diagnosis not present

## 2022-11-23 DIAGNOSIS — R2681 Unsteadiness on feet: Secondary | ICD-10-CM | POA: Diagnosis not present

## 2022-11-23 DIAGNOSIS — R41841 Cognitive communication deficit: Secondary | ICD-10-CM | POA: Diagnosis not present

## 2022-11-23 DIAGNOSIS — M6281 Muscle weakness (generalized): Secondary | ICD-10-CM | POA: Diagnosis not present

## 2022-11-23 DIAGNOSIS — R279 Unspecified lack of coordination: Secondary | ICD-10-CM | POA: Diagnosis not present

## 2022-11-23 DIAGNOSIS — I480 Paroxysmal atrial fibrillation: Secondary | ICD-10-CM | POA: Diagnosis not present

## 2022-11-23 DIAGNOSIS — Z9181 History of falling: Secondary | ICD-10-CM | POA: Diagnosis not present

## 2022-11-23 DIAGNOSIS — I639 Cerebral infarction, unspecified: Secondary | ICD-10-CM | POA: Diagnosis not present

## 2022-11-23 DIAGNOSIS — R2689 Other abnormalities of gait and mobility: Secondary | ICD-10-CM | POA: Diagnosis not present

## 2022-11-25 DIAGNOSIS — F4320 Adjustment disorder, unspecified: Secondary | ICD-10-CM | POA: Diagnosis not present

## 2022-11-28 DIAGNOSIS — L24A2 Irritant contact dermatitis due to fecal, urinary or dual incontinence: Secondary | ICD-10-CM | POA: Diagnosis not present

## 2022-11-29 DIAGNOSIS — R2681 Unsteadiness on feet: Secondary | ICD-10-CM | POA: Diagnosis not present

## 2022-11-29 DIAGNOSIS — J449 Chronic obstructive pulmonary disease, unspecified: Secondary | ICD-10-CM | POA: Diagnosis not present

## 2022-11-29 DIAGNOSIS — I480 Paroxysmal atrial fibrillation: Secondary | ICD-10-CM | POA: Diagnosis not present

## 2022-11-29 DIAGNOSIS — Z9181 History of falling: Secondary | ICD-10-CM | POA: Diagnosis not present

## 2022-11-29 DIAGNOSIS — R2689 Other abnormalities of gait and mobility: Secondary | ICD-10-CM | POA: Diagnosis not present

## 2022-11-29 DIAGNOSIS — R279 Unspecified lack of coordination: Secondary | ICD-10-CM | POA: Diagnosis not present

## 2022-11-29 DIAGNOSIS — R41841 Cognitive communication deficit: Secondary | ICD-10-CM | POA: Diagnosis not present

## 2022-11-29 DIAGNOSIS — M6281 Muscle weakness (generalized): Secondary | ICD-10-CM | POA: Diagnosis not present

## 2022-11-29 DIAGNOSIS — I639 Cerebral infarction, unspecified: Secondary | ICD-10-CM | POA: Diagnosis not present

## 2022-12-01 DIAGNOSIS — Z681 Body mass index (BMI) 19 or less, adult: Secondary | ICD-10-CM | POA: Diagnosis not present

## 2022-12-02 ENCOUNTER — Ambulatory Visit: Payer: Medicare Other | Admitting: Cardiovascular Disease

## 2022-12-02 ENCOUNTER — Other Ambulatory Visit: Payer: Self-pay | Admitting: *Deleted

## 2022-12-02 NOTE — Patient Outreach (Signed)
Late entry for 12/01/22  [name] resides in [facility]. Screening for care coordination services as benefit of health plan and PCP.  Facility site visit to [place]. Met with [name] and daughter [name]. [name] reports [name] will return home on Friday, July 12th. Will have [facility]. [name] reports she lives with [name]. States transportation is provided by [facility] Choice. States they are utilizing this service upon SNF discharge. [name] has confusion. Explained care coordination services. [name] is agreeable. Provided brochure and contact information to [place].   [name] (daughter/DPR) is primary contact and she be contacted at [phone]  Will plan to make referral upon SNF discharge.   Lori Noble, MSN, RN,BSN [facility] Post Acute Care Coordinator [phone] (Direct dial)

## 2022-12-05 ENCOUNTER — Other Ambulatory Visit: Payer: Self-pay | Admitting: *Deleted

## 2022-12-05 ENCOUNTER — Telehealth: Payer: Self-pay | Admitting: Family Medicine

## 2022-12-05 DIAGNOSIS — I5032 Chronic diastolic (congestive) heart failure: Secondary | ICD-10-CM

## 2022-12-05 NOTE — Patient Outreach (Signed)
Per [facility] Mrs. Lish discharged from [facility] skilled nursing facility on 12/02/22. Screening for potential [facility] care coordination services as benefit of health plan and Primary Care Provider.  Previously spoke with daughter Dorene Grebe who was agreeable to care coordination services post SNF. Mrs. Butterbaugh will have [facility] home health.   Serenna Deroy (daughter/DPR) is primary contact at [phone].  Will refer for RN care coordination services.   Raiford Noble, MSN, RN,BSN [facility] Post Acute Care Coordinator [phone] (Direct dial)

## 2022-12-05 NOTE — Telephone Encounter (Signed)
Jen from [facility] called to let us know Russell has been released from [facility] and is requesting to receive home health services. Provided call back number [phone]

## 2022-12-06 ENCOUNTER — Telehealth: Payer: Self-pay | Admitting: *Deleted

## 2022-12-06 ENCOUNTER — Telehealth: Payer: Self-pay | Admitting: Family Medicine

## 2022-12-06 DIAGNOSIS — I700 Atherosclerosis of aorta: Secondary | ICD-10-CM | POA: Diagnosis not present

## 2022-12-06 DIAGNOSIS — L89152 Pressure ulcer of sacral region, stage 2: Secondary | ICD-10-CM | POA: Diagnosis not present

## 2022-12-06 DIAGNOSIS — Z7901 Long term (current) use of anticoagulants: Secondary | ICD-10-CM | POA: Diagnosis not present

## 2022-12-06 DIAGNOSIS — R531 Weakness: Secondary | ICD-10-CM | POA: Diagnosis not present

## 2022-12-06 DIAGNOSIS — E041 Nontoxic single thyroid nodule: Secondary | ICD-10-CM | POA: Diagnosis not present

## 2022-12-06 DIAGNOSIS — G9389 Other specified disorders of brain: Secondary | ICD-10-CM | POA: Diagnosis not present

## 2022-12-06 DIAGNOSIS — I480 Paroxysmal atrial fibrillation: Secondary | ICD-10-CM | POA: Diagnosis not present

## 2022-12-06 DIAGNOSIS — Z950 Presence of cardiac pacemaker: Secondary | ICD-10-CM | POA: Diagnosis not present

## 2022-12-06 DIAGNOSIS — J449 Chronic obstructive pulmonary disease, unspecified: Secondary | ICD-10-CM | POA: Diagnosis not present

## 2022-12-06 DIAGNOSIS — Z9181 History of falling: Secondary | ICD-10-CM | POA: Diagnosis not present

## 2022-12-06 DIAGNOSIS — E876 Hypokalemia: Secondary | ICD-10-CM | POA: Diagnosis not present

## 2022-12-06 DIAGNOSIS — I110 Hypertensive heart disease with heart failure: Secondary | ICD-10-CM | POA: Diagnosis not present

## 2022-12-06 DIAGNOSIS — J439 Emphysema, unspecified: Secondary | ICD-10-CM | POA: Diagnosis not present

## 2022-12-06 DIAGNOSIS — Z951 Presence of aortocoronary bypass graft: Secondary | ICD-10-CM | POA: Diagnosis not present

## 2022-12-06 DIAGNOSIS — Z952 Presence of prosthetic heart valve: Secondary | ICD-10-CM | POA: Diagnosis not present

## 2022-12-06 DIAGNOSIS — I5032 Chronic diastolic (congestive) heart failure: Secondary | ICD-10-CM | POA: Diagnosis not present

## 2022-12-06 DIAGNOSIS — Z87440 Personal history of urinary (tract) infections: Secondary | ICD-10-CM | POA: Diagnosis not present

## 2022-12-06 DIAGNOSIS — I2510 Atherosclerotic heart disease of native coronary artery without angina pectoris: Secondary | ICD-10-CM | POA: Diagnosis not present

## 2022-12-06 DIAGNOSIS — I69398 Other sequelae of cerebral infarction: Secondary | ICD-10-CM | POA: Diagnosis not present

## 2022-12-06 DIAGNOSIS — E785 Hyperlipidemia, unspecified: Secondary | ICD-10-CM | POA: Diagnosis not present

## 2022-12-06 NOTE — Telephone Encounter (Signed)
Verbal order given. Frances Furbish will be faxing a plan of care order to our office for Dr. Susann Givens to sign.

## 2022-12-06 NOTE — Telephone Encounter (Signed)
April from [facility] health is requesting verbal orders for skilled nursing once a week for six weeks. Wound care with xerosom and border foam change twice weekly and social work evaluation along with Home Health aid once a week for three weeks.  Provided call back number [phone]

## 2022-12-06 NOTE — Progress Notes (Signed)
  Care Coordination   Note   12/06/2022 Name: [name] MRN: [id number] DOB: 03-18-1929  [name] is a 89 y.o. year old female who sees Ronnald Nian, MD for primary care. I reached out to [name] by phone today to offer care coordination services.  [name] was given information about Care Coordination services today including:   The Care Coordination services include support from the care team which includes your Nurse Coordinator, Clinical Social Worker, or Pharmacist.  The Care Coordination team is here to help remove barriers to the health concerns and goals most important to you. Care Coordination services are voluntary, and the patient may decline or stop services at any time by request to their care team member.   Care Coordination Consent Status: Patient agreed to services and verbal consent obtained.   Follow up plan:  Telephone appointment with care coordination team member scheduled for:  12/20/2022  Encounter Outcome:  Pt. Scheduled from referral   Burman Nieves, [facility] Care Coordination Care Guide Direct Dial: [phone]

## 2022-12-07 DIAGNOSIS — I2510 Atherosclerotic heart disease of native coronary artery without angina pectoris: Secondary | ICD-10-CM | POA: Diagnosis not present

## 2022-12-07 DIAGNOSIS — I5032 Chronic diastolic (congestive) heart failure: Secondary | ICD-10-CM | POA: Diagnosis not present

## 2022-12-07 DIAGNOSIS — I69398 Other sequelae of cerebral infarction: Secondary | ICD-10-CM | POA: Diagnosis not present

## 2022-12-07 DIAGNOSIS — L89152 Pressure ulcer of sacral region, stage 2: Secondary | ICD-10-CM | POA: Diagnosis not present

## 2022-12-07 DIAGNOSIS — R531 Weakness: Secondary | ICD-10-CM | POA: Diagnosis not present

## 2022-12-07 DIAGNOSIS — I110 Hypertensive heart disease with heart failure: Secondary | ICD-10-CM | POA: Diagnosis not present

## 2022-12-07 NOTE — Telephone Encounter (Signed)
Returned call to April and gave verbal orders.

## 2022-12-08 ENCOUNTER — Telehealth: Payer: Self-pay

## 2022-12-08 ENCOUNTER — Encounter: Payer: Self-pay | Admitting: Family Medicine

## 2022-12-08 ENCOUNTER — Telehealth: Payer: Self-pay | Admitting: Family Medicine

## 2022-12-08 ENCOUNTER — Ambulatory Visit: Payer: Medicare Other | Admitting: Family Medicine

## 2022-12-08 VITALS — BP 148/56 | HR 65 | Temp 97.9°F | Resp 14 | Wt 75.0 lb

## 2022-12-08 DIAGNOSIS — L89159 Pressure ulcer of sacral region, unspecified stage: Secondary | ICD-10-CM | POA: Diagnosis not present

## 2022-12-08 DIAGNOSIS — I5022 Chronic systolic (congestive) heart failure: Secondary | ICD-10-CM | POA: Diagnosis not present

## 2022-12-08 DIAGNOSIS — Z515 Encounter for palliative care: Secondary | ICD-10-CM

## 2022-12-08 DIAGNOSIS — R110 Nausea: Secondary | ICD-10-CM | POA: Diagnosis not present

## 2022-12-08 MED ORDER — HYDROXYZINE HCL 10 MG PO TABS
10.0000 mg | ORAL_TABLET | Freq: Three times a day (TID) | ORAL | 0 refills | Status: DC | PRN
Start: 2022-12-08 — End: 2023-01-17

## 2022-12-08 MED ORDER — ONDANSETRON HCL 4 MG PO TABS: 4.0000 mg | ORAL_TABLET | Freq: Three times a day (TID) | ORAL | 2 refills | Status: DC | PRN

## 2022-12-08 NOTE — Progress Notes (Signed)
   Subjective:    Patient ID: [name], female    DOB: [birth year], 89 y.o.   MRN: [id number]  HPI She is here for a follow-up visit.  She is with her daughter.  She was admitted to the hospital May 28 and sent to [facility] rehab on the 28th.  She has been there until approximately week or 2 ago.  She was given a referral to hospice although some question as to the need for that.  The daughter is concerned over the fact that she wants her mother to get better especially in regard to walking.  Further discussion indicates she has not walked in 2 years.  There is also concern about a pressure sore.  Apparently hospice is helping with that.  The main concern today is nausea.  The daughter would also like something to help with sleep and states that the hydroxyzine was useful. She has a previous 6 sensitive cardiac history.  Review of Systems     Objective:    Physical Exam Alert and in no distress.  Cardiac exam shows regular rhythm without murmurs or gallops.  An attempt was made to see the gluteal area although I could not really truly assess it as she was sitting in a wheelchair.       Assessment & Plan:   Nausea - Plan: ondansetron (ZOFRAN) 4 MG tablet, hydrOXYzine (ATARAX) 10 MG tablet  Pressure injury of skin of sacral region, unspecified injury stage I explained to the daughter and [name] the fact that we are not going to be able to make her better but wanted maintain good care for her.  I think the daughter had unrealistic expectations of what can be accomplished.  Recommend continued use of hospice for palliative/hospice care.  I explained that I will continue to work with hospice concerning getting her gluteal area taken care of in general care for her ADLs.  Zofran and Atarax was called in.

## 2022-12-08 NOTE — Telephone Encounter (Signed)
Nurse April with [facility] health called our office at 4:22pm and left the following message on our voice message system:   Someone in [facility] coding department coded patient's chart wrong. [name] needs clarification on which diagnosis to use for patient's starter care after she had the CVA and was seen in the ER. Should they code it as generalized weakness, OR muscle weakness related to the CVA.   April is requesting a call back with this information. Please advise. [phone].

## 2022-12-08 NOTE — Telephone Encounter (Signed)
[name] [phone] PT called PT eval  1 x week for  8 weeks for functional mobility and fall risk reduction  Chronic pain in hands  7 out of 10, patient calls it arthritis

## 2022-12-09 DIAGNOSIS — R531 Weakness: Secondary | ICD-10-CM | POA: Diagnosis not present

## 2022-12-09 DIAGNOSIS — I2510 Atherosclerotic heart disease of native coronary artery without angina pectoris: Secondary | ICD-10-CM | POA: Diagnosis not present

## 2022-12-09 DIAGNOSIS — I69398 Other sequelae of cerebral infarction: Secondary | ICD-10-CM | POA: Diagnosis not present

## 2022-12-09 DIAGNOSIS — I110 Hypertensive heart disease with heart failure: Secondary | ICD-10-CM | POA: Diagnosis not present

## 2022-12-09 DIAGNOSIS — I5032 Chronic diastolic (congestive) heart failure: Secondary | ICD-10-CM | POA: Diagnosis not present

## 2022-12-09 DIAGNOSIS — L89152 Pressure ulcer of sacral region, stage 2: Secondary | ICD-10-CM | POA: Diagnosis not present

## 2022-12-09 NOTE — Telephone Encounter (Signed)
[name]  with [facility] ok per Dr Susann Givens

## 2022-12-12 NOTE — Telephone Encounter (Signed)
Returned call to April and advised her of Dr. Jola Babinski response.

## 2022-12-13 DIAGNOSIS — I110 Hypertensive heart disease with heart failure: Secondary | ICD-10-CM | POA: Diagnosis not present

## 2022-12-13 DIAGNOSIS — I5032 Chronic diastolic (congestive) heart failure: Secondary | ICD-10-CM | POA: Diagnosis not present

## 2022-12-13 DIAGNOSIS — L89152 Pressure ulcer of sacral region, stage 2: Secondary | ICD-10-CM | POA: Diagnosis not present

## 2022-12-13 DIAGNOSIS — I2510 Atherosclerotic heart disease of native coronary artery without angina pectoris: Secondary | ICD-10-CM | POA: Diagnosis not present

## 2022-12-13 DIAGNOSIS — R531 Weakness: Secondary | ICD-10-CM | POA: Diagnosis not present

## 2022-12-13 DIAGNOSIS — I69398 Other sequelae of cerebral infarction: Secondary | ICD-10-CM | POA: Diagnosis not present

## 2022-12-14 ENCOUNTER — Other Ambulatory Visit: Payer: Medicare Other

## 2022-12-14 DIAGNOSIS — I5032 Chronic diastolic (congestive) heart failure: Secondary | ICD-10-CM | POA: Diagnosis not present

## 2022-12-14 DIAGNOSIS — I4891 Unspecified atrial fibrillation: Secondary | ICD-10-CM | POA: Diagnosis not present

## 2022-12-14 DIAGNOSIS — I110 Hypertensive heart disease with heart failure: Secondary | ICD-10-CM | POA: Diagnosis not present

## 2022-12-14 DIAGNOSIS — R531 Weakness: Secondary | ICD-10-CM | POA: Diagnosis not present

## 2022-12-14 DIAGNOSIS — I2510 Atherosclerotic heart disease of native coronary artery without angina pectoris: Secondary | ICD-10-CM | POA: Diagnosis not present

## 2022-12-14 DIAGNOSIS — L89152 Pressure ulcer of sacral region, stage 2: Secondary | ICD-10-CM | POA: Diagnosis not present

## 2022-12-14 DIAGNOSIS — I10 Essential (primary) hypertension: Secondary | ICD-10-CM | POA: Diagnosis not present

## 2022-12-14 DIAGNOSIS — I69398 Other sequelae of cerebral infarction: Secondary | ICD-10-CM | POA: Diagnosis not present

## 2022-12-14 NOTE — Progress Notes (Signed)
   12/14/2022  Patient ID: [name], female   DOB: [birth year], 89 y.o.   MRN: [id number]  S/O Telephone visit to establish care with [facility] Pharm[facility]  Medication Management -Review medication list to assess for accuracy and adherence -Patient does not endorse any affordability or transportation issues that would be a barrier to adherence -She endorses good adherence to medication regimen and fill history reflects this -Verified current pharmacotherapy appropriate to relative dianoses and kidney/liver function4 -Patient has no needs, questions, or concerns at this time  Medication Management -Continue current regimen and regular follow-up with provider(s)  Follow-up:  None scheduled but can as needed per PCP  Lenna Gilford, PharmD, DPLA

## 2022-12-20 ENCOUNTER — Emergency Department (HOSPITAL_COMMUNITY): Payer: Medicare Other

## 2022-12-20 ENCOUNTER — Inpatient Hospital Stay (HOSPITAL_COMMUNITY)
Admission: EM | Admit: 2022-12-20 | Discharge: 2022-12-23 | DRG: 689 | Disposition: A | Discharge status: Home Health | Payer: Medicare Other | Attending: Internal Medicine | Admitting: Internal Medicine

## 2022-12-20 ENCOUNTER — Ambulatory Visit: Payer: Self-pay

## 2022-12-20 ENCOUNTER — Encounter (HOSPITAL_COMMUNITY): Payer: Self-pay

## 2022-12-20 ENCOUNTER — Other Ambulatory Visit: Payer: Self-pay

## 2022-12-20 DIAGNOSIS — I10 Essential (primary) hypertension: Secondary | ICD-10-CM | POA: Diagnosis not present

## 2022-12-20 DIAGNOSIS — I4819 Other persistent atrial fibrillation: Secondary | ICD-10-CM | POA: Diagnosis present

## 2022-12-20 DIAGNOSIS — I69320 Aphasia following cerebral infarction: Secondary | ICD-10-CM

## 2022-12-20 DIAGNOSIS — E878 Other disorders of electrolyte and fluid balance, not elsewhere classified: Secondary | ICD-10-CM | POA: Diagnosis present

## 2022-12-20 DIAGNOSIS — I2510 Atherosclerotic heart disease of native coronary artery without angina pectoris: Secondary | ICD-10-CM | POA: Diagnosis present

## 2022-12-20 DIAGNOSIS — Z951 Presence of aortocoronary bypass graft: Secondary | ICD-10-CM

## 2022-12-20 DIAGNOSIS — N390 Urinary tract infection, site not specified: Secondary | ICD-10-CM | POA: Diagnosis present

## 2022-12-20 DIAGNOSIS — Z955 Presence of coronary angioplasty implant and graft: Secondary | ICD-10-CM

## 2022-12-20 DIAGNOSIS — Z681 Body mass index (BMI) 19 or less, adult: Secondary | ICD-10-CM | POA: Diagnosis not present

## 2022-12-20 DIAGNOSIS — Z66 Do not resuscitate: Secondary | ICD-10-CM | POA: Diagnosis present

## 2022-12-20 DIAGNOSIS — Z888 Allergy status to other drugs, medicaments and biological substances status: Secondary | ICD-10-CM

## 2022-12-20 DIAGNOSIS — Z8249 Family history of ischemic heart disease and other diseases of the circulatory system: Secondary | ICD-10-CM

## 2022-12-20 DIAGNOSIS — Z85828 Personal history of other malignant neoplasm of skin: Secondary | ICD-10-CM

## 2022-12-20 DIAGNOSIS — Z79899 Other long term (current) drug therapy: Secondary | ICD-10-CM

## 2022-12-20 DIAGNOSIS — E43 Unspecified severe protein-calorie malnutrition: Secondary | ICD-10-CM | POA: Diagnosis present

## 2022-12-20 DIAGNOSIS — I110 Hypertensive heart disease with heart failure: Secondary | ICD-10-CM | POA: Diagnosis present

## 2022-12-20 DIAGNOSIS — K219 Gastro-esophageal reflux disease without esophagitis: Secondary | ICD-10-CM | POA: Diagnosis present

## 2022-12-20 DIAGNOSIS — R404 Transient alteration of awareness: Secondary | ICD-10-CM | POA: Diagnosis not present

## 2022-12-20 DIAGNOSIS — Z950 Presence of cardiac pacemaker: Secondary | ICD-10-CM

## 2022-12-20 DIAGNOSIS — R4701 Aphasia: Secondary | ICD-10-CM | POA: Diagnosis not present

## 2022-12-20 DIAGNOSIS — E876 Hypokalemia: Secondary | ICD-10-CM | POA: Diagnosis present

## 2022-12-20 DIAGNOSIS — B9620 Unspecified Escherichia coli [E. coli] as the cause of diseases classified elsewhere: Secondary | ICD-10-CM | POA: Diagnosis present

## 2022-12-20 DIAGNOSIS — Z87440 Personal history of urinary (tract) infections: Secondary | ICD-10-CM

## 2022-12-20 DIAGNOSIS — M1990 Unspecified osteoarthritis, unspecified site: Secondary | ICD-10-CM | POA: Diagnosis present

## 2022-12-20 DIAGNOSIS — I5032 Chronic diastolic (congestive) heart failure: Secondary | ICD-10-CM | POA: Diagnosis present

## 2022-12-20 DIAGNOSIS — E871 Hypo-osmolality and hyponatremia: Secondary | ICD-10-CM | POA: Diagnosis present

## 2022-12-20 DIAGNOSIS — Z952 Presence of prosthetic heart valve: Secondary | ICD-10-CM | POA: Diagnosis not present

## 2022-12-20 DIAGNOSIS — J45909 Unspecified asthma, uncomplicated: Secondary | ICD-10-CM | POA: Diagnosis present

## 2022-12-20 DIAGNOSIS — L89320 Pressure ulcer of left buttock, unstageable: Secondary | ICD-10-CM | POA: Diagnosis present

## 2022-12-20 DIAGNOSIS — R296 Repeated falls: Secondary | ICD-10-CM | POA: Diagnosis present

## 2022-12-20 DIAGNOSIS — G9341 Metabolic encephalopathy: Secondary | ICD-10-CM | POA: Diagnosis not present

## 2022-12-20 DIAGNOSIS — I779 Disorder of arteries and arterioles, unspecified: Secondary | ICD-10-CM | POA: Diagnosis present

## 2022-12-20 DIAGNOSIS — M797 Fibromyalgia: Secondary | ICD-10-CM | POA: Diagnosis present

## 2022-12-20 DIAGNOSIS — F419 Anxiety disorder, unspecified: Secondary | ICD-10-CM | POA: Diagnosis present

## 2022-12-20 DIAGNOSIS — R069 Unspecified abnormalities of breathing: Secondary | ICD-10-CM | POA: Diagnosis not present

## 2022-12-20 DIAGNOSIS — I959 Hypotension, unspecified: Secondary | ICD-10-CM | POA: Diagnosis not present

## 2022-12-20 DIAGNOSIS — I739 Peripheral vascular disease, unspecified: Secondary | ICD-10-CM | POA: Diagnosis present

## 2022-12-20 DIAGNOSIS — F0390 Unspecified dementia without behavioral disturbance: Secondary | ICD-10-CM | POA: Diagnosis present

## 2022-12-20 DIAGNOSIS — N3001 Acute cystitis with hematuria: Principal | ICD-10-CM

## 2022-12-20 DIAGNOSIS — Z91048 Other nonmedicinal substance allergy status: Secondary | ICD-10-CM

## 2022-12-20 DIAGNOSIS — Z881 Allergy status to other antibiotic agents status: Secondary | ICD-10-CM

## 2022-12-20 DIAGNOSIS — Z87891 Personal history of nicotine dependence: Secondary | ICD-10-CM

## 2022-12-20 DIAGNOSIS — R0602 Shortness of breath: Secondary | ICD-10-CM | POA: Diagnosis not present

## 2022-12-20 DIAGNOSIS — E785 Hyperlipidemia, unspecified: Secondary | ICD-10-CM | POA: Diagnosis present

## 2022-12-20 DIAGNOSIS — M419 Scoliosis, unspecified: Secondary | ICD-10-CM | POA: Diagnosis present

## 2022-12-20 DIAGNOSIS — Z7901 Long term (current) use of anticoagulants: Secondary | ICD-10-CM

## 2022-12-20 DIAGNOSIS — Z886 Allergy status to analgesic agent status: Secondary | ICD-10-CM

## 2022-12-20 DIAGNOSIS — Z90710 Acquired absence of both cervix and uterus: Secondary | ICD-10-CM

## 2022-12-20 LAB — CBC WITH DIFFERENTIAL/PLATELET
Abs Immature Granulocytes: 0.02 10*3/uL (ref 0.00–0.07)
Basophils Absolute: 0.1 10*3/uL (ref 0.0–0.1)
Basophils Relative: 1 %
Eosinophils Absolute: 0.1 10*3/uL (ref 0.0–0.5)
Eosinophils Relative: 1 %
HCT: 43.3 % (ref 36.0–46.0)
Hemoglobin: 14 g/dL (ref 12.0–15.0)
Immature Granulocytes: 0 %
Lymphocytes Relative: 20 %
Lymphs Abs: 1.8 10*3/uL (ref 0.7–4.0)
MCH: 29.2 pg (ref 26.0–34.0)
MCHC: 32.3 g/dL (ref 30.0–36.0)
MCV: 90.2 fL (ref 80.0–100.0)
Monocytes Absolute: 0.7 10*3/uL (ref 0.1–1.0)
Monocytes Relative: 8 %
Neutro Abs: 6.2 10*3/uL (ref 1.7–7.7)
Neutrophils Relative %: 70 %
Platelets: 218 10*3/uL (ref 150–400)
RBC: 4.8 MIL/uL (ref 3.87–5.11)
RDW: 13.8 % (ref 11.5–15.5)
WBC: 8.9 10*3/uL (ref 4.0–10.5)
nRBC: 0 % (ref 0.0–0.2)

## 2022-12-20 LAB — COMPREHENSIVE METABOLIC PANEL
ALT: 32 U/L (ref 0–44)
AST: 32 U/L (ref 15–41)
Albumin: 3.8 g/dL (ref 3.5–5.0)
Alkaline Phosphatase: 43 U/L (ref 38–126)
Anion gap: 12 (ref 5–15)
BUN: 5 mg/dL — ABNORMAL LOW (ref 8–23)
CO2: 29 mmol/L (ref 22–32)
Calcium: 9.4 mg/dL (ref 8.9–10.3)
Chloride: 92 mmol/L — ABNORMAL LOW (ref 98–111)
Creatinine, Ser: 0.47 mg/dL (ref 0.44–1.00)
GFR, Estimated: >60 mL/min (ref >60)
Glucose, Bld: 110 mg/dL — ABNORMAL HIGH (ref 70–99)
Potassium: 3 mmol/L — ABNORMAL LOW (ref 3.5–5.1)
Sodium: 133 mmol/L — ABNORMAL LOW (ref 135–145)
Total Bilirubin: 0.8 mg/dL (ref 0.3–1.2)
Total Protein: 6.3 g/dL — ABNORMAL LOW (ref 6.5–8.1)

## 2022-12-20 LAB — I-STAT VENOUS BLOOD GAS, ED
Acid-Base Excess: 8 mmol/L — ABNORMAL HIGH (ref 0.0–2.0)
Bicarbonate: 34.3 mmol/L — ABNORMAL HIGH (ref 20.0–28.0)
Calcium, Ion: 1.14 mmol/L — ABNORMAL LOW (ref 1.15–1.40)
HCT: 43 % (ref 36.0–46.0)
Hemoglobin: 14.6 g/dL (ref 12.0–15.0)
O2 Saturation: 66 %
Potassium: 3.1 mmol/L — ABNORMAL LOW (ref 3.5–5.1)
Sodium: 133 mmol/L — ABNORMAL LOW (ref 135–145)
TCO2: 36 mmol/L — ABNORMAL HIGH (ref 22–32)
pCO2, Ven: 51.5 mmHg (ref 44–60)
pH, Ven: 7.432 — ABNORMAL HIGH (ref 7.25–7.43)
pO2, Ven: 34 mmHg (ref 32–45)

## 2022-12-20 LAB — BRAIN NATRIURETIC PEPTIDE: B Natriuretic Peptide: 146.3 pg/mL — ABNORMAL HIGH (ref 0.0–100.0)

## 2022-12-20 LAB — URINALYSIS, W/ REFLEX TO CULTURE (INFECTION SUSPECTED)
Bilirubin Urine: NEGATIVE
Glucose, UA: NEGATIVE mg/dL
Ketones, ur: NEGATIVE mg/dL
Nitrite: NEGATIVE
Protein, ur: 30 mg/dL — AB
Specific Gravity, Urine: 1.003 — ABNORMAL LOW (ref 1.005–1.030)
WBC, UA: >50 WBC/hpf (ref 0–5)
pH: 6 (ref 5.0–8.0)

## 2022-12-20 LAB — TROPONIN I (HIGH SENSITIVITY)
Troponin I (High Sensitivity): 39 ng/L — ABNORMAL HIGH (ref <18)
Troponin I (High Sensitivity): 33 ng/L — ABNORMAL HIGH (ref <18)

## 2022-12-20 MED ORDER — POLYETHYLENE GLYCOL 3350 17 G PO PACK: 17.0000 g | PACK | Freq: Every day | ORAL | Status: DC | PRN

## 2022-12-20 MED ORDER — ACETAMINOPHEN 650 MG RE SUPP: 650.0000 mg | Freq: Four times a day (QID) | RECTAL | Status: DC | PRN

## 2022-12-20 MED ORDER — ALBUTEROL SULFATE (2.5 MG/3ML) 0.083% IN NEBU: 2.5000 mg | INHALATION_SOLUTION | Freq: Four times a day (QID) | RESPIRATORY_TRACT | Status: DC | PRN

## 2022-12-20 MED ORDER — HEPARIN SODIUM (PORCINE) 5000 UNIT/ML IJ SOLN: 5000.0000 [IU] | Freq: Two times a day (BID) | INTRAMUSCULAR | Status: DC

## 2022-12-20 MED ORDER — AMIODARONE HCL 200 MG PO TABS
200.0000 mg | ORAL_TABLET | Freq: Every day | ORAL | Status: DC
  Administered 2022-12-21 – 2022-12-23 (×3): 200 mg via ORAL
  Filled 2022-12-20 (×3): qty 1

## 2022-12-20 MED ORDER — ACETAMINOPHEN 325 MG PO TABS: 650.0000 mg | ORAL_TABLET | Freq: Four times a day (QID) | ORAL | Status: DC | PRN

## 2022-12-20 MED ORDER — PANTOPRAZOLE SODIUM 40 MG PO TBEC
40.0000 mg | DELAYED_RELEASE_TABLET | Freq: Two times a day (BID) | ORAL | Status: DC
  Administered 2022-12-20 – 2022-12-23 (×6): 40 mg via ORAL
  Filled 2022-12-20 (×6): qty 1

## 2022-12-20 MED ORDER — LACTATED RINGERS IV BOLUS
500.0000 mL | Freq: Once | INTRAVENOUS | Status: AC
  Administered 2022-12-20: 500 mL via INTRAVENOUS

## 2022-12-20 MED ORDER — LORATADINE 10 MG PO TABS
10.0000 mg | ORAL_TABLET | Freq: Every day | ORAL | Status: DC
  Administered 2022-12-21 – 2022-12-23 (×3): 10 mg via ORAL
  Filled 2022-12-20 (×3): qty 1

## 2022-12-20 MED ORDER — DOCUSATE SODIUM 100 MG PO CAPS
100.0000 mg | ORAL_CAPSULE | Freq: Two times a day (BID) | ORAL | Status: DC
  Administered 2022-12-20 – 2022-12-22 (×5): 100 mg via ORAL
  Filled 2022-12-20 (×6): qty 1

## 2022-12-20 MED ORDER — ONDANSETRON HCL 4 MG/2ML IJ SOLN: 4.0000 mg | Freq: Four times a day (QID) | INTRAMUSCULAR | Status: DC | PRN

## 2022-12-20 MED ORDER — APIXABAN 2.5 MG PO TABS
2.5000 mg | ORAL_TABLET | Freq: Two times a day (BID) | ORAL | Status: DC
  Administered 2022-12-20 – 2022-12-23 (×6): 2.5 mg via ORAL
  Filled 2022-12-20 (×6): qty 1

## 2022-12-20 MED ORDER — ENOXAPARIN SODIUM 40 MG/0.4ML IJ SOSY: 40.0000 mg | PREFILLED_SYRINGE | INTRAMUSCULAR | Status: DC

## 2022-12-20 MED ORDER — SODIUM CHLORIDE 0.9 % IV SOLN
2.0000 g | INTRAVENOUS | Status: DC
  Administered 2022-12-21: 2 g via INTRAVENOUS
  Filled 2022-12-20: qty 20

## 2022-12-20 MED ORDER — ALBUTEROL SULFATE HFA 108 (90 BASE) MCG/ACT IN AERS: 2.0000 | INHALATION_SPRAY | Freq: Four times a day (QID) | RESPIRATORY_TRACT | Status: DC | PRN

## 2022-12-20 MED ORDER — BUDESONIDE 0.5 MG/2ML IN SUSP
0.5000 mg | Freq: Two times a day (BID) | RESPIRATORY_TRACT | Status: DC
  Administered 2022-12-20 – 2022-12-23 (×6): 0.5 mg via RESPIRATORY_TRACT
  Filled 2022-12-20 (×6): qty 2

## 2022-12-20 MED ORDER — SODIUM CHLORIDE 0.9 % IV SOLN
2.0000 g | Freq: Once | INTRAVENOUS | Status: AC
  Administered 2022-12-20: 2 g via INTRAVENOUS
  Filled 2022-12-20: qty 20

## 2022-12-20 MED ORDER — LACTATED RINGERS IV SOLN: INTRAVENOUS | Status: DC

## 2022-12-20 MED ORDER — SODIUM CHLORIDE 0.9% FLUSH
3.0000 mL | Freq: Two times a day (BID) | INTRAVENOUS | Status: DC
  Administered 2022-12-20 – 2022-12-23 (×6): 3 mL via INTRAVENOUS

## 2022-12-20 MED ORDER — ROSUVASTATIN CALCIUM 20 MG PO TABS
20.0000 mg | ORAL_TABLET | Freq: Every day | ORAL | Status: DC
  Administered 2022-12-21 – 2022-12-22 (×2): 20 mg via ORAL
  Filled 2022-12-20 (×2): qty 1

## 2022-12-20 MED ORDER — BISACODYL 5 MG PO TBEC: 5.0000 mg | DELAYED_RELEASE_TABLET | Freq: Every day | ORAL | Status: DC | PRN

## 2022-12-20 MED ORDER — HYDRALAZINE HCL 20 MG/ML IJ SOLN: 5.0000 mg | INTRAMUSCULAR | Status: DC | PRN

## 2022-12-20 MED ORDER — LINACLOTIDE 72 MCG PO CAPS
72.0000 ug | ORAL_CAPSULE | Freq: Every day | ORAL | Status: DC
  Administered 2022-12-21 – 2022-12-23 (×3): 72 ug via ORAL
  Filled 2022-12-20 (×3): qty 1

## 2022-12-20 MED ORDER — HYDROXYZINE HCL 10 MG PO TABS
10.0000 mg | ORAL_TABLET | Freq: Three times a day (TID) | ORAL | Status: DC | PRN
  Administered 2022-12-21: 10 mg via ORAL
  Filled 2022-12-20: qty 1

## 2022-12-20 MED ORDER — ONDANSETRON HCL 4 MG PO TABS: 4.0000 mg | ORAL_TABLET | Freq: Four times a day (QID) | ORAL | Status: DC | PRN

## 2022-12-20 MED ORDER — TETRAHYDROZOLINE HCL 0.05 % OP SOLN: 2.0000 [drp] | Freq: Every day | OPHTHALMIC | Status: DC | PRN

## 2022-12-20 NOTE — Progress Notes (Signed)
Dinner tray ordered for pt

## 2022-12-20 NOTE — Progress Notes (Signed)
Pt arrived to 6 north room 31 alert to self. Family at bedside. Open Wound on buttocks with foam dressing applied. Bed in lowest position. Call light in reach. Bed alarm on. Will continue to monitor pt.

## 2022-12-20 NOTE — Patient Outreach (Signed)
  Care Coordination   Chart Review  Visit Note   12/20/2022 Name: LAROYA RENNERT MRN: [id number] DOB: Feb 05, 1929  SHERHONDA MICKIEWICZ is a 89 y.o. year old female who sees Ronnald Nian, MD for primary care. I reviewed patient's chart in preparation to contact patient and noted she is currently at the Emergency Department at [facility].   What matters to the patients health and wellness today?  N/a    Goals Addressed             This Visit's Progress    RN Care Coordination Activities: further follow up needed       Care Coordination Interventions: Reviewed chart in preparation to contact patient/daughter for initial RN CC outreach; noted patient is currently in the [facility] Emergency Department at [facility] for evaluation of acute cystitis with hematuria        SDOH assessments and interventions completed:  No     Care Coordination Interventions:  Yes, provided   Follow up plan:  [facility] Care Guide will outreach to patient's daughter to reschedule initial RN CC outreach     Encounter Outcome:  Pt. Visit Completed

## 2022-12-20 NOTE — ED Triage Notes (Signed)
Pt arrived to ED via EMS from home w/ c/o shob onset 0600. Pt w/ previous CVA w/ aphasia deficit. EMS reports decreased lung sounds and attempted albuterol inhaler, but pt was unable to follow commands to use the inhaler properly. EMS administered 1 duoneb. Pt was 96% on RA. EMS reports when pt starts stuttering and trying to get her words out her O2 will drop to 82-92 from 100% while on neb mask. IV unable to be obtained en route.

## 2022-12-20 NOTE — ED Notes (Addendum)
Pt IV came out, bolus stopped. Daughter at bedside not sure if it got caught on something and was pulled out or what happened. MD notified

## 2022-12-20 NOTE — ED Provider Notes (Addendum)
[facility] [address]  SURGICAL Provider Note  CSN: [id number] Arrival date & time: 12/20/22 7425  Chief Complaint(s) Shortness of Breath  HPI [name] is a 89 y.o. female with PMH aortic stenosis status post TAVR, CAD status post CABG, fibromyalgia, HTN, HLD, paroxysmal A-fib not on anticoagulation, previous CVA who presents emergency room for evaluation of shortness of breath, weakness and altered mental status.  History obtained from patient's daughter who is the patient's primary caretaker who states that symptoms began abruptly at 6 AM this morning where she noticed that the patient was having difficulty walking and complaining of shortness of breath.  EMS found the patient to have diminished lung sounds and administered 1 DuoNeb prior to arrival.  On arrival, patient is alert and answering questions but speech difficulties persist from previous strokes.  Additional history unable to be obtained secondary to patient's underlying dementia and altered mental status   Past Medical History Past Medical History:  Diagnosis Date   Anxiety    Aortic Stenosis s/p TAVR    Echo 10/21: EF 55-60, no RWMA, mild asymmetric LVH, normal RVSF, RVSP 40.2 mmHg, mild MR, s/p TAVR with mean gradient 8.14mmHg, no PVL   Arthritis    OSTEO   Aspirin allergy    on Plavix   Asthma    CAD (coronary artery disease)    a. s/p CABG 2006 with Cox-Maze procedure.   Carotid artery disease (HCC)    a. s/p R CEA.   Diastolic dysfunction    Eczema    Fibromyalgia    GERD (gastroesophageal reflux disease)    Hiatal hernia    Hyperlipidemia    Hypertension    Kyphoscoliosis    Mitral regurgitation    PAF (paroxysmal atrial fibrillation) (HCC)    a. pt has h/o hematuria on Eliquis and has since refused anticoagulation   Pneumonia 2015 ?   Pulmonary regurgitation    Skin cancer of arm    Stroke [facility])    Tricuspid regurgitation    Patient Active Problem List   Diagnosis Date Noted    Acute metabolic encephalopathy 12/20/2022   History of stroke 10/19/2022   UTI (urinary tract infection) 10/19/2022   Fall at home, initial encounter 10/19/2022   Fall 10/19/2022   CVA (cerebral vascular accident) (HCC) 10/18/2022   COPD (chronic obstructive pulmonary disease) (HCC) 09/06/2022   Paronychia of great toe of left foot 08/22/2022   Chronic systolic CHF (congestive heart failure) (HCC) 02/18/2022   DNR (do not resuscitate) 02/18/2022   Choledocholithiasis with acute cholecystitis 01/26/2022   Elevated troponin 01/03/2022   Secondary hypercoagulable state (HCC) 02/04/2021   Unexplained weight loss 01/21/2021   Protein-calorie malnutrition, severe 01/16/2021   Persistent atrial fibrillation (HCC) 01/14/2021   Sick sinus syndrome (HCC) 01/14/2021   Syncope 01/14/2021   Anemia 01/14/2021   Weakness 01/14/2021   GI bleed 04/22/2020   Hypokalemia    Presbycusis of both ears 11/13/2019   Vertigo 03/27/2017   Arthritis 03/27/2017   Stroke (HCC)    Status post transcatheter aortic valve replacement (TAVR) using bioprosthesis 01/03/2017   Chronic apical periodontitis 12/28/2016   Retained dental roots 12/28/2016   Bilateral maxillary lateral exostoses 12/28/2016   Chronic diastolic CHF (congestive heart failure) (HCC)    Chronic stable angina    CAD (coronary artery disease) of artery bypass graft    Tricuspid regurgitation    Pulmonary regurgitation    Left shoulder pain    SOB (shortness of breath)  Back pain-similar to pt's angina 12/01/2015   Carotid artery disease- s/p RCE 12/01/2015   Bowen's disease 03/16/2015   Dependent edema 10/30/2014   History of allergy to aspirin 06/02/2014   PAF (paroxysmal atrial fibrillation) (HCC)    Gastroesophageal reflux disease without esophagitis 12/05/2011   Hx of CABG 2006 with Maze 07/21/2011   Mitral regurgitation 07/21/2011   Allergic rhinitis 10/05/2010   Dyslipidemia 10/05/2010   Uncomplicated asthma 10/19/2007    Home Medication(s) Prior to Admission medications   Medication Sig Start Date End Date Taking? Authorizing Provider  acetaminophen (TYLENOL) 650 MG CR tablet Take 650-1,300 mg by mouth 2 (two) times daily as needed for pain.   Yes [provider]  amiodarone (PACERONE) 200 MG tablet Take 1 tablet (200 mg total) by mouth daily. 02/23/22  Yes Camnitz, Andree Coss, MD  apixaban (ELIQUIS) 2.5 MG TABS tablet Take 1 tablet (2.5 mg total) by mouth 2 (two) times daily. 05/20/22  Yes Kathleene Hazel, MD  Carboxymethylcellulose Sodium (EYE DROPS OP) Apply 2 drops to eye daily as needed (gritty eyes).   Yes [provider]  Cholecalciferol (VITAMIN D-3) 25 MCG (1000 UT) CAPS Take 1,000 Units by mouth daily.   Yes [provider]  docusate sodium (COLACE) 100 MG capsule Take 100 mg by mouth daily.   Yes [provider]  Ensure (ENSURE) Take 237 mLs by mouth daily.   Yes [provider]  fluticasone (FLOVENT HFA) 220 MCG/ACT inhaler Inhale into the lungs 2 (two) times daily.   Yes [provider]  hydrOXYzine (ATARAX) 10 MG tablet Take 1 tablet (10 mg total) by mouth 3 (three) times daily as needed for anxiety. 12/08/22  Yes Ronnald Nian, MD  LINZESS 72 MCG capsule Take 1 capsule (72 mcg total) by mouth daily. 02/10/21  Yes Medina-Vargas, Monina C, NP  loratadine (CLARITIN) 10 MG tablet Take 1 tablet (10 mg total) by mouth daily. 02/10/21  Yes Medina-Vargas, Monina C, NP  Multiple Vitamin (MULTIVITAMIN WITH MINERALS) TABS tablet Take 1 tablet by mouth daily. 10/22/22  Yes Kathlen Mody, MD  nitroGLYCERIN (NITROSTAT) 0.4 MG SL tablet DISSOLVE 1 TABLET UNDER THE TONGUE EVERY 5 MINUTES AS NEEDED FOR CHEST PAIN Patient taking differently: Place 0.4 mg under the tongue See admin instructions. DISSOLVE 1 TABLET UNDER THE TONGUE EVERY 5 MINUTES AS NEEDED FOR CHEST PAIN 11/01/21  Yes Kathleene Hazel, MD  ondansetron (ZOFRAN) 4 MG tablet Take 1 tablet  (4 mg total) by mouth every 8 (eight) hours as needed for nausea or vomiting. 12/08/22  Yes Ronnald Nian, MD  pantoprazole (PROTONIX) 40 MG tablet Take 1 tablet (40 mg total) by mouth 2 (two) times daily. 01/26/22  Yes Ronnald Nian, MD  rosuvastatin (CRESTOR) 20 MG tablet TAKE 1 TABLET(20 MG) BY MOUTH DAILY Patient taking differently: Take 20 mg by mouth daily. 11/03/22  Yes Ronnald Nian, MD  VENTOLIN HFA 108 (90 Base) MCG/ACT inhaler INHALE 2 PUFFS INTO THE LUNGS EVERY 6 HOURS AS NEEDED FOR WHEEZING OR SHORTNESS OF BREATH Patient taking differently: Inhale 2 puffs into the lungs every 6 (six) hours as needed for wheezing or shortness of breath. 07/15/21  Yes Ronnald Nian, MD  Wheat Dextrin (BENEFIBER PO) Take 1 Scoop by mouth daily as needed (fiber).   Yes [provider]  Spacer/Aero-Holding Rudean Curt Use as directed Patient taking differently: 1 each by Other route daily. 09/14/22   Ronnald Nian, MD  Past Surgical History Past Surgical History:  Procedure Laterality Date   ABDOMINAL HYSTERECTOMY  1976   CAROTID ENDARTERECTOMY  2010   CORONARY ANGIOPLASTY WITH STENT PLACEMENT     CORONARY ARTERY BYPASS GRAFT  01/2005   LIMA-D1; SVG-LAD; SVG-OM; SVG-PDA   ERCP N/A 12/28/2021   Procedure: ENDOSCOPIC RETROGRADE CHOLANGIOPANCREATOGRAPHY (ERCP);  Surgeon: Jeani Hawking, MD;  Location: [facility] ENDOSCOPY;  Service: Gastroenterology;  Laterality: N/A;   ESOPHAGOGASTRODUODENOSCOPY (EGD) WITH PROPOFOL N/A 03/19/2021   Procedure: ESOPHAGOGASTRODUODENOSCOPY (EGD) WITH PROPOFOL;  Surgeon: Jeani Hawking, MD;  Location: [facility] ENDOSCOPY;  Service: Endoscopy;  Laterality: N/A;   EYE SURGERY     MULTIPLE EXTRACTIONS WITH ALVEOLOPLASTY  12/28/2016   Extraction of tooth #'s 2- 5,7-10, 12,13,17-20,and 22-29 with alveoloplasty and maxillary right and left lateral exostoses  reductions   MULTIPLE EXTRACTIONS WITH ALVEOLOPLASTY N/A 12/28/2016   Procedure: Extraction of tooth #'s 2- 5,7-10, 12,13,17-20,and 22-29 with alveoloplasty and maxillary right and left lateral exostoses reductions;  Surgeon: Charlynne Pander, DDS;  Location: [facility] OR;  Service: Oral Surgery;  Laterality: N/A;   PACEMAKER IMPLANT N/A 01/15/2021   Procedure: PACEMAKER IMPLANT;  Surgeon: Regan Lemming, MD;  Location: [facility] INVASIVE CV LAB;  Service: Cardiovascular;  Laterality: N/A;   REMOVAL OF STONES  12/28/2021   Procedure: REMOVAL OF STONES;  Surgeon: Jeani Hawking, MD;  Location: [facility] ENDOSCOPY;  Service: Gastroenterology;;   RIGHT/LEFT HEART CATH AND CORONARY/GRAFT ANGIOGRAPHY N/A 10/27/2016   Procedure: Right/Left Heart Cath and Coronary/Graft Angiography;  Surgeon: Kathleene Hazel, MD;  Location: [facility] INVASIVE CV LAB;  Service: Cardiovascular;  Laterality: N/A;   SAVORY DILATION N/A 03/19/2021   Procedure: SAVORY DILATION;  Surgeon: Jeani Hawking, MD;  Location: [facility] ENDOSCOPY;  Service: Endoscopy;  Laterality: N/A;   SPHINCTEROTOMY  12/28/2021   Procedure: SPHINCTEROTOMY;  Surgeon: Jeani Hawking, MD;  Location: [facility] ENDOSCOPY;  Service: Gastroenterology;;   TEE WITHOUT CARDIOVERSION N/A 01/03/2017   Procedure: TRANSESOPHAGEAL ECHOCARDIOGRAM (TEE);  Surgeon: Kathleene Hazel, MD;  Location: [facility] OR;  Service: Open Heart Surgery;  Laterality: N/A;   TONSILLECTOMY     TRANSCATHETER AORTIC VALVE REPLACEMENT, TRANSFEMORAL N/A 01/03/2017   Procedure: TRANSCATHETER AORTIC VALVE REPLACEMENT, TRANSFEMORAL;  Surgeon: Kathleene Hazel, MD;  Location: [facility] OR;  Service: Open Heart Surgery;  Laterality: N/A;   TUMOR REMOVAL     Family History Family History  Problem Relation Age of Onset   Heart failure Mother    Other Father     Social History Social History   Tobacco Use   Smoking status: Never   Smokeless tobacco: Former    Types: Snuff    Quit date: 05/23/2001   Tobacco comments:     Former Snuff (02/04/2021)  Vaping Use   Vaping status: Never Used  Substance Use Topics   Alcohol use: No   Drug use: No   Allergies Other, Bactrim [sulfamethoxazole-trimethoprim], Amitriptyline hcl, Aspirin, Tape, and Zetia [ezetimibe]  Review of Systems Review of Systems  Unable to perform ROS: Mental status change    Physical Exam Vital Signs  I have reviewed the triage vital signs BP (!) 126/55 (BP Location: Right Arm)   Pulse 70   Temp 98 F (36.7 C) (Oral)   Resp 16   Ht 5\' 2"  (1.575 m)   Wt 34 kg   SpO2 100%   BMI 13.71 kg/m   Physical Exam Vitals and nursing note reviewed.  Constitutional:      General: She is not in acute distress.  Appearance: She is well-developed.  HENT:     Head: Normocephalic and atraumatic.  Eyes:     Conjunctiva/sclera: Conjunctivae normal.  Cardiovascular:     Rate and Rhythm: Normal rate and regular rhythm.     Heart sounds: No murmur heard. Pulmonary:     Effort: Pulmonary effort is normal. No respiratory distress.     Breath sounds: Normal breath sounds.  Abdominal:     Palpations: Abdomen is soft.     Tenderness: There is no abdominal tenderness.  Musculoskeletal:        General: No swelling.     Cervical back: Neck supple.  Skin:    General: Skin is warm and dry.     Capillary Refill: Capillary refill takes less than 2 seconds.  Neurological:     Mental Status: She is alert.  Psychiatric:        Mood and Affect: Mood normal.     ED Results and Treatments Labs (all labs ordered are listed, but only abnormal results are displayed) Labs Reviewed  COMPREHENSIVE METABOLIC PANEL - Abnormal; Notable for the following components:      Result Value   Sodium 133 (*)    Potassium 3.0 (*)    Chloride 92 (*)    Glucose, Bld 110 (*)    BUN 5 (*)    Total Protein 6.3 (*)    All other components within normal limits  BRAIN NATRIURETIC PEPTIDE - Abnormal; Notable for the following components:   B Natriuretic Peptide  146.3 (*)    All other components within normal limits  URINALYSIS, W/ REFLEX TO CULTURE (INFECTION SUSPECTED) - Abnormal; Notable for the following components:   APPearance CLOUDY (*)    Specific Gravity, Urine 1.003 (*)    Hgb urine dipstick SMALL (*)    Protein, ur 30 (*)    Leukocytes,Ua LARGE (*)    Bacteria, UA MANY (*)    Non Squamous Epithelial 0-5 (*)    All other components within normal limits  I-STAT VENOUS BLOOD GAS, ED - Abnormal; Notable for the following components:   pH, Ven 7.432 (*)    Bicarbonate 34.3 (*)    TCO2 36 (*)    Acid-Base Excess 8.0 (*)    Sodium 133 (*)    Potassium 3.1 (*)    Calcium, Ion 1.14 (*)    All other components within normal limits  TROPONIN I (HIGH SENSITIVITY) - Abnormal; Notable for the following components:   Troponin I (High Sensitivity) 39 (*)    All other components within normal limits  TROPONIN I (HIGH SENSITIVITY) - Abnormal; Notable for the following components:   Troponin I (High Sensitivity) 33 (*)    All other components within normal limits  URINE CULTURE  CBC WITH DIFFERENTIAL/PLATELET  BASIC METABOLIC PANEL  CBC  Radiology DG Chest Portable 1 View  Result Date: 12/20/2022 CLINICAL DATA:  Shortness of breath.  Aphasia. EXAM: PORTABLE CHEST 1 VIEW COMPARISON:  10/18/2022 FINDINGS: Previous median sternotomy and CABG. Previous TAVR. Dual lead pacemaker in place. The right lung is clear. Chronic markings at the left lung base appears similar to prior studies. No evidence of heart failure or effusion. IMPRESSION: Previous median sternotomy and CABG. Previous TAVR. Dual lead pacemaker. Chronic markings at the left lung base. Electronically Signed   By: Paulina Fusi M.D.   On: 12/20/2022 08:40    Pertinent labs & imaging results that were available during my care of the patient were reviewed by me and  considered in my medical decision making (see MDM for details).  Medications Ordered in ED Medications  cefTRIAXone (ROCEPHIN) 2 g in sodium chloride 0.9 % 100 mL IVPB (has no administration in time range)  sodium chloride flush (NS) 0.9 % injection 3 mL (3 mLs Intravenous Given 12/20/22 1806)  lactated ringers infusion ( Intravenous New Bag/Given 12/20/22 1805)  acetaminophen (TYLENOL) tablet 650 mg (has no administration in time range)    Or  acetaminophen (TYLENOL) suppository 650 mg (has no administration in time range)  docusate sodium (COLACE) capsule 100 mg (100 mg Oral Not Given 12/20/22 1800)  polyethylene glycol (MIRALAX / GLYCOLAX) packet 17 g (has no administration in time range)  bisacodyl (DULCOLAX) EC tablet 5 mg (has no administration in time range)  ondansetron (ZOFRAN) tablet 4 mg (has no administration in time range)    Or  ondansetron (ZOFRAN) injection 4 mg (has no administration in time range)  hydrALAZINE (APRESOLINE) injection 5 mg (has no administration in time range)  heparin injection 5,000 Units (has no administration in time range)  lactated ringers bolus 500 mL (0 mLs Intravenous Stopped 12/20/22 1039)  cefTRIAXone (ROCEPHIN) 2 g in sodium chloride 0.9 % 100 mL IVPB (0 g Intravenous Stopped 12/20/22 1227)                                                                                                                                     Procedures Ultrasound ED Peripheral IV (Provider)  Date/Time: 12/20/2022 7:53 PM  Performed by: Glendora Score, MD Authorized by: Glendora Score, MD   Procedure details:    Indications: multiple failed IV attempts     Skin Prep: isopropyl alcohol     Location:  Right AC   Angiocath:  18 G   Bedside Ultrasound Guided: Yes     Images: not archived     Patient tolerated procedure without complications: Yes     Dressing applied: Yes     (including critical care time)  Medical Decision Making / ED Course   This patient  presents to the ED for concern of altered mental status, dyspnea, this involves an extensive number of treatment options, and is a complaint that carries with it a high risk of  complications and morbidity.  The differential diagnosis includes COPD exacerbation, pneumonia, UTI, electrolyte abnormality, bacteremia, sepsis  MDM: Patient seen emerged part for evaluation of altered mental status and shortness of breath.  Physical exam with no appreciable wheezing or accessory muscle use.  Patient maintaining oxygen saturations on room air without difficulty.  Laboratory evaluation with a mild hypokalemia to 3.0, hypochloremia to 92, hyponatremia to 133, BNP elevated to 146, pH normal at 7.4, high-sensitivity troponin 39 but delta troponin 33.  Chest x-ray with no acute findings.  Urinalysis is concerning for UTI with large leuk esterase, 21-50 red blood cells, greater than 50 white blood cells and many bacteria.  Patient started on ceftriaxone and will require hospital admission for altered mental status and UTI.  Patient admitted.   Additional history obtained: -Additional history obtained from daughter -External records from outside source obtained and reviewed including: Chart review including previous notes, labs, imaging, consultation notes   Lab Tests: -I ordered, reviewed, and interpreted labs.   The pertinent results include:   Labs Reviewed  COMPREHENSIVE METABOLIC PANEL - Abnormal; Notable for the following components:      Result Value   Sodium 133 (*)    Potassium 3.0 (*)    Chloride 92 (*)    Glucose, Bld 110 (*)    BUN 5 (*)    Total Protein 6.3 (*)    All other components within normal limits  BRAIN NATRIURETIC PEPTIDE - Abnormal; Notable for the following components:   B Natriuretic Peptide 146.3 (*)    All other components within normal limits  URINALYSIS, W/ REFLEX TO CULTURE (INFECTION SUSPECTED) - Abnormal; Notable for the following components:   APPearance CLOUDY (*)     Specific Gravity, Urine 1.003 (*)    Hgb urine dipstick SMALL (*)    Protein, ur 30 (*)    Leukocytes,Ua LARGE (*)    Bacteria, UA MANY (*)    Non Squamous Epithelial 0-5 (*)    All other components within normal limits  I-STAT VENOUS BLOOD GAS, ED - Abnormal; Notable for the following components:   pH, Ven 7.432 (*)    Bicarbonate 34.3 (*)    TCO2 36 (*)    Acid-Base Excess 8.0 (*)    Sodium 133 (*)    Potassium 3.1 (*)    Calcium, Ion 1.14 (*)    All other components within normal limits  TROPONIN I (HIGH SENSITIVITY) - Abnormal; Notable for the following components:   Troponin I (High Sensitivity) 39 (*)    All other components within normal limits  TROPONIN I (HIGH SENSITIVITY) - Abnormal; Notable for the following components:   Troponin I (High Sensitivity) 33 (*)    All other components within normal limits  URINE CULTURE  CBC WITH DIFFERENTIAL/PLATELET  BASIC METABOLIC PANEL  CBC      EKG   EKG Interpretation Date/Time:  Tuesday December 20 2022 08:06:48 EDT Ventricular Rate:  96 PR Interval:  220 QRS Duration:  176 QT Interval:  492 QTC Calculation: 645 R Axis:   -89  Text Interpretation: Ventricular-paced rhythm Interpretation limited secondary to artifact Confirmed by Kamdyn Covel ([id number]) on 12/20/2022 8:25:10 AM         Imaging Studies ordered: I ordered imaging studies including chest x-ray I independently visualized and interpreted imaging. I agree with the radiologist interpretation   Medicines ordered and prescription drug management: Meds ordered this encounter  Medications   lactated ringers bolus 500 mL   cefTRIAXone (ROCEPHIN) 2 g  in sodium chloride 0.9 % 100 mL IVPB    Order Specific Question:   Antibiotic Indication:    Answer:   UTI   cefTRIAXone (ROCEPHIN) 2 g in sodium chloride 0.9 % 100 mL IVPB    Order Specific Question:   Antibiotic Indication:    Answer:   UTI   DISCONTD: enoxaparin (LOVENOX) injection 40 mg   sodium chloride  flush (NS) 0.9 % injection 3 mL   lactated ringers infusion   OR Linked Order Group    acetaminophen (TYLENOL) tablet 650 mg    acetaminophen (TYLENOL) suppository 650 mg   docusate sodium (COLACE) capsule 100 mg   polyethylene glycol (MIRALAX / GLYCOLAX) packet 17 g   bisacodyl (DULCOLAX) EC tablet 5 mg   OR Linked Order Group    ondansetron (ZOFRAN) tablet 4 mg    ondansetron (ZOFRAN) injection 4 mg   hydrALAZINE (APRESOLINE) injection 5 mg   heparin injection 5,000 Units    -I have reviewed the patients home medicines and have made adjustments as needed  Critical interventions none    Cardiac Monitoring: The patient was maintained on a cardiac monitor.  I personally viewed and interpreted the cardiac monitored which showed an underlying rhythm of: V paced rhythm  Social Determinants of Health:  Factors impacting patients care include: none   Reevaluation: After the interventions noted above, I reevaluated the patient and found that they have :improved  Co morbidities that complicate the patient evaluation  Past Medical History:  Diagnosis Date   Anxiety    Aortic Stenosis s/p TAVR    Echo 10/21: EF 55-60, no RWMA, mild asymmetric LVH, normal RVSF, RVSP 40.2 mmHg, mild MR, s/p TAVR with mean gradient 8.75mmHg, no PVL   Arthritis    OSTEO   Aspirin allergy    on Plavix   Asthma    CAD (coronary artery disease)    a. s/p CABG 2006 with Cox-Maze procedure.   Carotid artery disease (HCC)    a. s/p R CEA.   Diastolic dysfunction    Eczema    Fibromyalgia    GERD (gastroesophageal reflux disease)    Hiatal hernia    Hyperlipidemia    Hypertension    Kyphoscoliosis    Mitral regurgitation    PAF (paroxysmal atrial fibrillation) (HCC)    a. pt has h/o hematuria on Eliquis and has since refused anticoagulation   Pneumonia 2015 ?   Pulmonary regurgitation    Skin cancer of arm    Stroke [facility])    Tricuspid regurgitation       Dispostion: I considered admission  for this patient, and due to altered mental status and concomitant UTI patient require hospital admission     Final Clinical Impression(s) / ED Diagnoses Final diagnoses:  Acute cystitis with hematuria     @PCDICTATION @    Glendora Score, MD 12/20/22 Cephus Slater, MD 12/20/22 1954

## 2022-12-20 NOTE — ED Notes (Signed)
Attempted IV unsuccessful 

## 2022-12-20 NOTE — Progress Notes (Signed)
[facility] [phone] [facility] Liaison note:  This patient is currently enrolled in [facility] outpatient-based palliative care. Hospital Liaison will continue to follow for discharge disposition. Please call for any outpatient based palliative care related questions or concerns.  Thank you, Thea Gist BSN, RN Hospice hospital liaison  [phone]

## 2022-12-20 NOTE — H&P (Signed)
History and Physical    Patient: [name]:[id number] DOB: 02-27-1929 DOA: 12/20/2022 DOS: the patient was seen and examined on 12/20/2022 PCP: Lori Nian, MD  Patient coming from: Home - lives with daughter; NOK: Daughter, [name], [phone]   Chief Complaint: AMS  HPI: [name] is a 89 y.o. female with medical history significant of TAVR, CAD s/p CABG, carotid disease s/p R CEA, HFpEF, HTN, HLD, afib, and CVA with persistent aphasia presenting with AMS.  Her daughter reports that the last few days she has not been eating a lot, drinking a lot of water, more tired than usual.  She is periodically agitated.  She fell in May, has not been getting up to the bedside commode as well.  She has had home health nursing, daughter is trying to care for her at home.  She has been in [name], talk of transition to hospice.    ER Course:  Elderly patient with AMS, has UTI.  C/o SOB, generalized weakness.  Improving a little in the ER, concern about being able to care for her at home.  Given Duoneb, not hypoxic and no SOB currently.     Review of Systems: unable to review all systems due to the inability of the patient to answer questions. Past Medical History:  Diagnosis Date   Anxiety    Aortic Stenosis s/p TAVR    Echo 10/21: EF 55-60, no RWMA, mild asymmetric LVH, normal RVSF, RVSP 40.2 mmHg, mild MR, s/p TAVR with mean gradient 8.68mmHg, no PVL   Arthritis    OSTEO   Aspirin allergy    on Plavix   Asthma    CAD (coronary artery disease)    a. s/p CABG 2006 with [name] procedure.   Carotid artery disease (HCC)    a. s/p R CEA.   Diastolic dysfunction    Eczema    Fibromyalgia    GERD (gastroesophageal reflux disease)    Hiatal hernia    Hyperlipidemia    Hypertension    Kyphoscoliosis    Mitral regurgitation    PAF (paroxysmal atrial fibrillation) (HCC)    a. pt has h/o hematuria on Eliquis and has since refused anticoagulation   Pneumonia 2015 ?    Pulmonary regurgitation    Skin cancer of arm    Stroke [name])    Tricuspid regurgitation    Past Surgical History:  Procedure Laterality Date   ABDOMINAL HYSTERECTOMY  1976   CAROTID ENDARTERECTOMY  2010   CORONARY ANGIOPLASTY WITH STENT PLACEMENT     CORONARY ARTERY BYPASS GRAFT  01/2005   LIMA-D1; SVG-LAD; SVG-OM; SVG-PDA   ERCP N/A 12/28/2021   Procedure: ENDOSCOPIC RETROGRADE CHOLANGIOPANCREATOGRAPHY (ERCP);  Surgeon: Lori Hawking, MD;  Location: [name];  Service: Gastroenterology;  Laterality: N/A;   ESOPHAGOGASTRODUODENOSCOPY (EGD) WITH PROPOFOL N/A 03/19/2021   Procedure: ESOPHAGOGASTRODUODENOSCOPY (EGD) WITH PROPOFOL;  Surgeon: Lori Hawking, MD;  Location: [name];  Service: [name];  Laterality: N/A;   EYE SURGERY     MULTIPLE EXTRACTIONS WITH ALVEOLOPLASTY  12/28/2016   Extraction of tooth #'s 2- 5,7-10, 12,13,17-20,and 22-29 with alveoloplasty and maxillary right and left lateral exostoses reductions   MULTIPLE EXTRACTIONS WITH ALVEOLOPLASTY N/A 12/28/2016   Procedure: Extraction of tooth #'s 2- 5,7-10, 12,13,17-20,and 22-29 with alveoloplasty and maxillary right and left lateral exostoses reductions;  Surgeon: [name], DDS;  [name];  Service: Oral Surgery;  Laterality: N/A;   PACEMAKER IMPLANT N/A 01/15/2021   Procedure: PACEMAKER IMPLANT;  Surgeon:  Lori Lemming, MD;  [name];  Service: Cardiovascular;  Laterality: N/A;   REMOVAL OF STONES  12/28/2021   Procedure: REMOVAL OF STONES;  Surgeon: Lori Hawking, MD;  [name];  Service: Gastroenterology;;   RIGHT/LEFT HEART CATH AND CORONARY/GRAFT ANGIOGRAPHY N/A 10/27/2016   Procedure: Right/Left Heart Cath and Coronary/Graft Angiography;  Surgeon: Lori Hazel, MD;  [facility]: [name];  Service: Cardiovascular;  Laterality: N/A;   SAVORY DILATION N/A 03/19/2021   Procedure: SAVORY DILATION;  Surgeon: Lori Hawking, MD;  [facility]: [name];  Service: [name];  Laterality: N/A;   SPHINCTEROTOMY  12/28/2021   Procedure: SPHINCTEROTOMY;  Surgeon: Lori Hawking, MD;  [name];  Service: Gastroenterology;;   TEE WITHOUT CARDIOVERSION N/A 01/03/2017   Procedure: TRANSESOPHAGEAL ECHOCARDIOGRAM (TEE);  Surgeon: Lori Hazel, MD;  Location: [name];  Service: Open Heart Surgery;  Laterality: N/A;   TONSILLECTOMY     TRANSCATHETER AORTIC VALVE REPLACEMENT, TRANSFEMORAL N/A 01/03/2017   Procedure: TRANSCATHETER AORTIC VALVE REPLACEMENT, TRANSFEMORAL;  Surgeon: Lori Hazel, MD;  [facility]: [name];  Service: Open Heart Surgery;  Laterality: N/A;   TUMOR REMOVAL     Social History:  reports that she has never smoked. She quit smokeless tobacco use about 21 years ago.  Her smokeless tobacco use included snuff. She reports that she does not drink alcohol and does not use drugs.  Allergies  Allergen Reactions   Other Other (See Comments)    NO ACIDIC, TART< OR SPICY FOODS- DEVELOPS REFLUX OFTEN!!  PATIENT HAS TROUBLE SWALLOWING TABLETS!!   Bactrim [Sulfamethoxazole-Trimethoprim] Other (See Comments)    "makes her feel funny" or "unsteady"    Amitriptyline Hcl Rash   Aspirin Hives, Swelling, Rash and Other (See Comments)    Body became swollen   Tape Other (See Comments)    SKIN IS VERY THIN- WILL TEAR EASILY!!   Zetia [Ezetimibe] Rash    Family History  Problem Relation Age of Onset   Heart failure Mother    Other Father     Prior to Admission medications   Medication Sig Start Date End Date Taking? Authorizing Provider  acetaminophen (TYLENOL) 650 MG CR tablet Take 650-1,300 mg by mouth 2 (two) times daily as needed for pain.    [provider]  amiodarone (PACERONE) 200 MG tablet Take 1 tablet (200 mg total) by mouth daily. 02/23/22   Camnitz, Andree Coss, MD  apixaban (ELIQUIS) 2.5 MG TABS tablet Take 1 tablet (2.5 mg total) by mouth 2 (two) times daily. 05/20/22   Lori Hazel, MD  Carboxymethylcellulose Sodium (EYE DROPS OP) Apply 2 drops to eye daily as needed (gritty eyes).    [provider]  Cholecalciferol (VITAMIN D-3) 25 MCG (1000 UT) CAPS Take 1,000 Units by mouth daily.    [provider]  docusate sodium (COLACE) 100 MG capsule Take 100 mg by mouth daily.    [provider]  Ensure (ENSURE) Take 237 mLs by mouth daily.    [provider]  fluticasone (FLOVENT HFA) 220 MCG/ACT inhaler Inhale into the lungs 2 (two) times daily.    [provider]  hydrOXYzine (ATARAX) 10 MG tablet Take 1 tablet (10 mg total) by mouth 3 (three) times daily as needed for anxiety. 12/08/22   Lori Nian, MD  LINZESS 72 MCG capsule Take 1 capsule (72 mcg total) by mouth daily. 02/10/21   Medina-Vargas, Monina C, NP  loratadine (CLARITIN) 10 MG tablet  Take 1 tablet (10 mg total) by mouth daily. 02/10/21   Medina-Vargas, Monina C, NP  Multiple Vitamin (MULTIVITAMIN WITH MINERALS) TABS tablet Take 1 tablet by mouth daily. 10/22/22   Kathlen Mody, MD  nitroGLYCERIN (NITROSTAT) 0.4 MG SL tablet DISSOLVE 1 TABLET UNDER THE TONGUE EVERY 5 MINUTES AS NEEDED FOR CHEST PAIN 11/01/21   Lori Hazel, MD  ondansetron (ZOFRAN) 4 MG tablet Take 1 tablet (4 mg total) by mouth every 8 (eight) hours as needed for nausea or vomiting. 12/08/22   Lori Nian, MD  pantoprazole (PROTONIX) 40 MG tablet Take 1 tablet (40 mg total) by mouth 2 (two) times daily. 01/26/22   Lori Nian, MD  rosuvastatin (CRESTOR) 20 MG tablet TAKE 1 TABLET(20 MG) BY MOUTH DAILY 11/03/22   Lori Nian, MD  Spacer/[facility] Use as directed 09/14/22   Lori Nian, MD  VENTOLIN HFA 108 [phone] Base) MCG/ACT inhaler INHALE 2 PUFFS INTO THE LUNGS EVERY 6 HOURS AS NEEDED FOR WHEEZING OR SHORTNESS OF BREATH Patient taking differently: Inhale 2 puffs into the lungs every 6 (six) hours as needed for wheezing or shortness of breath. 07/15/21    Lori Nian, MD  Wheat Dextrin (BENEFIBER PO) Take 1 Scoop by mouth daily as needed (fiber).    [provider]    Physical Exam: Vitals:   12/20/22 1245 12/20/22 1315 12/20/22 1400 12/20/22 1648  BP: 116/62 111/61 (!) 115/53 (!) 126/55  Pulse: (!) 47 (!) 53 70 70  Resp: (!) 27 (!) 21 20 16   Temp:    98 F (36.7 C)  TempSrc:    Oral  SpO2: 98% 99% 98% 100%  Weight:      Height:       General:  Appears agitated and very talkative with mostly nonsensical speech Eyes:   EOMI, normal lids, iris ENT:  grossly normal hearing, lips & tongue, mmm Neck:  no LAD, masses or thyromegaly Cardiovascular:  RRR, no m/r/g. No LE edema.  Respiratory:   CTA bilaterally with no wheezes/rales/rhonchi.  Normal respiratory effort. Abdomen:  soft, NT, ND Skin:  no rash or induration seen on limited exam Musculoskeletal:   no bony abnormality Psychiatric:  agitated and confused mood and affect, speech very fluent and nonsensical Neurologic:  CN 2-12 grossly intact, moves all extremities in coordinated fashion    Radiological Exams on Admission: Independently reviewed - see discussion in A/P where applicable  DG Chest Portable 1 View  Result Date: 12/20/2022 CLINICAL DATA:  Shortness of breath.  Aphasia. EXAM: PORTABLE CHEST 1 VIEW COMPARISON:  10/18/2022 FINDINGS: Previous median sternotomy and CABG. Previous TAVR. Dual lead pacemaker in place. The right lung is clear. Chronic markings at the left lung base appears similar to prior studies. No evidence of heart failure or effusion. IMPRESSION: Previous median sternotomy and CABG. Previous TAVR. Dual lead pacemaker. Chronic markings at the left lung base. Electronically Signed   By: Paulina Fusi M.D.   On: 12/20/2022 08:40    EKG: Independently reviewed.  Ventricular-paced with rate 96; prolonged QTC 645; nonspecific ST changes with no evidence of acute ischemia   Labs on Admission: I have personally reviewed the available labs and  imaging studies at the time of the admission.  Pertinent labs:    VBG: 7.432/51.5/34.3 Na++ 133 K+ 3.0 Glucose 110 BNP 146.3 HS troponin 39, 33 Normal CBC UA: small Hgb, large LE, 30 protein, many bacteria, >50 WBC Urine culture pending  Assessment and Plan:  Principal Problem:   Acute metabolic encephalopathy Active Problems:   Carotid artery disease- s/p RCE   Dyslipidemia   Hx of CABG 2006 with [facility]   Persistent atrial fibrillation (HCC)   DNR (do not resuscitate)   UTI (urinary tract infection)    Acute metabolic encephalopathy -Patient presenting with encephalopathy as evidenced by her agitation/confusion -While the patient does have underlying dementia, this is a change compared to her usual baseline mental status -Evaluation thus far unremarkable -Her acute metabolic encephalopathy is suspected to be due to a UTI at this time based on abnormal UA; urine culture is pending. -Will observe for now with IVF hydration and telemetry monitoring -Continue hydroxyzine  UTI -Concern for UTI based on AMS as well as >10 WBC and LE on UA and pyuria on microscopy. -Treatment started with ceftriaxone  CAD/PVD -s/p CABG and CEA -No c/o CP  HFpEF -Appears to be compensated at this time  HLD -Continue rosuvastatin  Afib -Rate controlled with amiodarone -Continue Eliquis  CVA -Persistent aphasia -Daughter reports that speech is close to baseline  DNR -ACP documents present with DNR status noted -Enrolled in palliative, possible transition to hospice in the near future -For now, will pan for outpatient palliative care f/u    Advance Care Planning:   Code Status: DNR   Consults: TOC team, nutrition, PT/OT/SLP  DVT Prophylaxis: Eliquis  Family Communication: None present; I spoke with her daughter by telephone  Severity of Illness: The appropriate patient status for this patient is OBSERVATION. Observation status is judged to be reasonable and necessary in  order to provide the required intensity of service to ensure the patient's safety. The patient's presenting symptoms, physical exam findings, and initial radiographic and laboratory data in the context of their medical condition is felt to place them at decreased risk for further clinical deterioration. Furthermore, it is anticipated that the patient will be medically stable for discharge from the [name] within 2 midnights of admission.   Author: Jonah Blue, MD 12/20/2022 6:29 PM  For on call review [email or web address].

## 2022-12-20 NOTE — ED Notes (Signed)
Pt brief changed and barrier cream applied to small wound on left buttock wound is open. No drainage.

## 2022-12-21 DIAGNOSIS — I2510 Atherosclerotic heart disease of native coronary artery without angina pectoris: Secondary | ICD-10-CM | POA: Diagnosis present

## 2022-12-21 DIAGNOSIS — B9620 Unspecified Escherichia coli [E. coli] as the cause of diseases classified elsewhere: Secondary | ICD-10-CM | POA: Diagnosis present

## 2022-12-21 DIAGNOSIS — Z66 Do not resuscitate: Secondary | ICD-10-CM | POA: Diagnosis present

## 2022-12-21 DIAGNOSIS — N3001 Acute cystitis with hematuria: Secondary | ICD-10-CM | POA: Diagnosis present

## 2022-12-21 DIAGNOSIS — K219 Gastro-esophageal reflux disease without esophagitis: Secondary | ICD-10-CM | POA: Diagnosis present

## 2022-12-21 DIAGNOSIS — F0390 Unspecified dementia without behavioral disturbance: Secondary | ICD-10-CM | POA: Diagnosis present

## 2022-12-21 DIAGNOSIS — E871 Hypo-osmolality and hyponatremia: Secondary | ICD-10-CM | POA: Diagnosis present

## 2022-12-21 DIAGNOSIS — I5032 Chronic diastolic (congestive) heart failure: Secondary | ICD-10-CM | POA: Diagnosis present

## 2022-12-21 DIAGNOSIS — Z952 Presence of prosthetic heart valve: Secondary | ICD-10-CM | POA: Diagnosis not present

## 2022-12-21 DIAGNOSIS — E878 Other disorders of electrolyte and fluid balance, not elsewhere classified: Secondary | ICD-10-CM | POA: Diagnosis present

## 2022-12-21 DIAGNOSIS — F419 Anxiety disorder, unspecified: Secondary | ICD-10-CM | POA: Diagnosis present

## 2022-12-21 DIAGNOSIS — Z681 Body mass index (BMI) 19 or less, adult: Secondary | ICD-10-CM | POA: Diagnosis not present

## 2022-12-21 DIAGNOSIS — I4819 Other persistent atrial fibrillation: Secondary | ICD-10-CM | POA: Diagnosis present

## 2022-12-21 DIAGNOSIS — E876 Hypokalemia: Secondary | ICD-10-CM | POA: Diagnosis present

## 2022-12-21 DIAGNOSIS — J45909 Unspecified asthma, uncomplicated: Secondary | ICD-10-CM | POA: Diagnosis present

## 2022-12-21 DIAGNOSIS — M419 Scoliosis, unspecified: Secondary | ICD-10-CM | POA: Diagnosis present

## 2022-12-21 DIAGNOSIS — I110 Hypertensive heart disease with heart failure: Secondary | ICD-10-CM | POA: Diagnosis present

## 2022-12-21 DIAGNOSIS — G9341 Metabolic encephalopathy: Secondary | ICD-10-CM | POA: Diagnosis present

## 2022-12-21 DIAGNOSIS — I739 Peripheral vascular disease, unspecified: Secondary | ICD-10-CM | POA: Diagnosis present

## 2022-12-21 DIAGNOSIS — E785 Hyperlipidemia, unspecified: Secondary | ICD-10-CM | POA: Diagnosis present

## 2022-12-21 DIAGNOSIS — N390 Urinary tract infection, site not specified: Secondary | ICD-10-CM | POA: Diagnosis present

## 2022-12-21 DIAGNOSIS — L89320 Pressure ulcer of left buttock, unstageable: Secondary | ICD-10-CM | POA: Diagnosis present

## 2022-12-21 DIAGNOSIS — M797 Fibromyalgia: Secondary | ICD-10-CM | POA: Diagnosis present

## 2022-12-21 DIAGNOSIS — I69320 Aphasia following cerebral infarction: Secondary | ICD-10-CM | POA: Diagnosis not present

## 2022-12-21 DIAGNOSIS — E43 Unspecified severe protein-calorie malnutrition: Secondary | ICD-10-CM | POA: Diagnosis present

## 2022-12-21 LAB — MAGNESIUM: Magnesium: 1.8 mg/dL (ref 1.7–2.4)

## 2022-12-21 MED ORDER — POTASSIUM CHLORIDE CRYS ER 20 MEQ PO TBCR
40.0000 meq | EXTENDED_RELEASE_TABLET | ORAL | Status: AC
  Administered 2022-12-21 (×2): 40 meq via ORAL
  Filled 2022-12-21 (×2): qty 2

## 2022-12-21 MED ORDER — ENSURE ENLIVE PO LIQD
237.0000 mL | Freq: Two times a day (BID) | ORAL | Status: DC
  Administered 2022-12-21 – 2022-12-23 (×4): 237 mL via ORAL

## 2022-12-21 MED ORDER — MAGNESIUM SULFATE 2 GM/50ML IV SOLN
2.0000 g | Freq: Once | INTRAVENOUS | Status: AC
  Administered 2022-12-21: 2 g via INTRAVENOUS
  Filled 2022-12-21: qty 50

## 2022-12-21 MED ORDER — ADULT MULTIVITAMIN W/MINERALS CH
1.0000 | ORAL_TABLET | Freq: Every day | ORAL | Status: DC
  Administered 2022-12-21 – 2022-12-23 (×3): 1 via ORAL
  Filled 2022-12-21 (×3): qty 1

## 2022-12-21 NOTE — Evaluation (Signed)
Physical Therapy Evaluation Patient Details Name: [name] MRN: [id number] DOB: May 17, 1929 Today's Date: 12/21/2022  History of Present Illness  89 y.o. female presenting with AM    Medical history significant of  aphasia,m CAD s/p CABG, atrial fib s/p [facility] procedure, CVA, carotid artery disease s/p right CEA, fibromyalgia, GERD, mild to moderate due to Choledocholithiasis. MR, severe AS s/p TAVR and sick sinus syndrome s/p pacemaker, diastolic dysfunction,HTN, HLD Asthma  Clinical Impression  Pt presents with admitting diagnosis above. Co treat with OT. Pt today was able to transfer to chair with +2 Mod A. Per daughter, pt appears to be close to her baseline. Recommend pt resume HHPT upon DC. PT will continue to follow.      If plan is discharge home, recommend the following: A lot of help with walking and/or transfers;A lot of help with bathing/dressing/bathroom;Assistance with cooking/housework;Direct supervision/assist for medications management;Assist for transportation;Help with stairs or ramp for entrance   Can travel by private vehicle        Equipment Recommendations None recommended by PT  Recommendations for Other Services       Functional Status Assessment Patient has had a recent decline in their functional status and demonstrates the ability to make significant improvements in function in a reasonable and predictable amount of time.     Precautions / Restrictions Precautions Precautions: Fall Restrictions Weight Bearing Restrictions: No      Mobility  Bed Mobility Overal bed mobility: Needs Assistance Bed Mobility: Supine to Sit     Supine to sit: Min assist     General bed mobility comments: Pt left sitting in recliner, needing min A to scoot forward to EOB but able to position self in sitting position with min guard to supervision    Transfers Overall transfer level: Needs assistance Equipment used: Rolling walker (2 wheels) Transfers: Sit to/from  Stand, Bed to chair/wheelchair/BSC Sit to Stand: Min assist, +2 physical assistance   Step pivot transfers: Mod assist, +2 physical assistance, +2 safety/equipment       General transfer comment: STSx2 with RW, one STS with HHA min A +2    Ambulation/Gait               General Gait Details: Pt unable  Stairs            Wheelchair Mobility     Tilt Bed    Modified Rankin (Stroke Patients Only)       Balance                                             Pertinent Vitals/Pain Pain Assessment Pain Assessment: No/denies pain    Home Living Family/patient expects to be discharged to:: Private residence Living Arrangements: Children Available Help at Discharge: Family;Available 24 hours/day Type of Home: House Home Access: Ramped entrance (3 thresholds to get in home, not like steps per daughter report.)       Home Layout: One level Home Equipment: Agricultural consultant (2 wheels);Cane - single point;BSC/3in1;Other (comment);Transport chair (gait belt) Additional Comments: pt does not go into bathroom, dtr assists with sponge bathing. Receives HH therapy, gets private transport to get in/out of home    Prior Function Prior Level of Function : Needs assist       Physical Assist : Mobility (physical) Mobility (physical): Transfers   Mobility Comments: uses transport chair, was working with  HHPT to ambulate short distances at home ADLs Comments: pt's daughter assists with ADL primarily bathing and dressing, assists with IADL as well     Hand Dominance   Dominant Hand: Right    Extremity/Trunk Assessment   Upper Extremity Assessment Upper Extremity Assessment: Generalized weakness    Lower Extremity Assessment Lower Extremity Assessment: Generalized weakness    Cervical / Trunk Assessment Cervical / Trunk Assessment: Other exceptions Cervical / Trunk Exceptions: malnurished  Communication   Communication: HOH  Cognition  Arousal/Alertness: Awake/alert Behavior During Therapy: WFL for tasks assessed/performed Overall Cognitive Status: History of cognitive impairments - at baseline                                 General Comments: Pt seems somewhat impulsive, makes gestures to stand without assist. Has a tangential speech pattern at baseline        General Comments General comments (skin integrity, edema, etc.): VSS on RA, daughter present    Exercises     Assessment/Plan    PT Assessment Patient needs continued PT services  PT Problem List Decreased strength;Decreased activity tolerance;Decreased range of motion;Decreased balance;Decreased mobility;Decreased coordination;Decreased cognition;Decreased knowledge of use of DME;Decreased safety awareness;Decreased knowledge of precautions;Pain       PT Treatment Interventions DME instruction;Stair training;Gait training;Functional mobility training;Therapeutic activities;Therapeutic exercise;Balance training;Neuromuscular re-education;Patient/family education    PT Goals (Current goals can be found in the Care Plan section)  Acute Rehab PT Goals Patient Stated Goal: to get better PT Goal Formulation: With patient Time For Goal Achievement: 01/04/23 Potential to Achieve Goals: Fair    Frequency Min 1X/week     Co-evaluation               AM-PAC PT "6 Clicks" Mobility  Outcome Measure Help needed turning from your back to your side while in a flat bed without using bedrails?: A Little Help needed moving from lying on your back to sitting on the side of a flat bed without using bedrails?: A Little Help needed moving to and from a bed to a chair (including a wheelchair)?: A Lot Help needed standing up from a chair using your arms (e.g., wheelchair or bedside chair)?: A Lot Help needed to walk in hospital room?: Total Help needed climbing 3-5 steps with a railing? : Total 6 Click Score: 12    End of Session Equipment Utilized  During Treatment: Gait belt Activity Tolerance: Patient tolerated treatment well Patient left: in chair;with call bell/phone within reach;with chair alarm set;with family/visitor present Nurse Communication: Mobility status PT Visit Diagnosis: Other abnormalities of gait and mobility (R26.89)    Time: 0254-2706 PT Time Calculation (min) (ACUTE ONLY): 32 min   Charges:   PT Evaluation $PT Eval Moderate Complexity: 1 Mod   PT General Charges $$ ACUTE PT VISIT: 1 Visit         Shela Nevin, PT, DPT Acute Rehab Services [phone]   [name] 12/21/2022, 4:38 PM

## 2022-12-21 NOTE — Discharge Instructions (Signed)

## 2022-12-21 NOTE — Progress Notes (Signed)
Initial Nutrition Assessment  DOCUMENTATION CODES:   Underweight, Severe malnutrition in context of chronic illness  INTERVENTION:  Liberalize pt diet to regular due to malnutrition Ensure Enlive po BID, each supplement provides 350 kcal and 20 grams of protein. Magic cup BID with meals, each supplement provides 290 kcal and 9 grams of protein Multivitamin w/ minerals daily Encourage good PO intake   NUTRITION DIAGNOSIS:   Severe Malnutrition related to chronic illness as evidenced by severe muscle depletion, severe fat depletion.  GOAL:   Patient will meet greater than or equal to 90% of their needs  MONITOR:   PO intake, Supplement acceptance, Labs, I & O's  REASON FOR ASSESSMENT:   Consult  (Nutritional Goals)  ASSESSMENT:   89 y.o. female presented to the ED with AMS. PMH includes CAD s/p CABG, HTN, HLD, CVA w/ aphasia, CHF, and GERD. Pt admitted with acute metabolic encephalopathy and UTI.   Met with pt and daughter in room. Reports that pt typically eats 2 meals per day, typically breakfast (eggs, grits, and sausage) and then supper. Drinks ~1/2 an Ensure per day or every other day. Pt prefers bland foods with not a lot of seasonings.  Reports a UBW in the 80's, have been trying to get her to gain weight back to 90# but have not been successful. Per weight history, unsure if current weight is accurate or pulled from previous weight. Added "High Calorie, High Protein Nutrition Therapy" to discharge instructions for family.  Discussed drinking Ensure here and trying [employer], pt and family agreeable.   Medications reviewed and include: Colace, Protonix, IV antibiotics  Labs reviewed: Sodium 135, Potassium 3.0, BUN 5, Creatinine 0.50, Magnesium 1.8   NUTRITION - FOCUSED PHYSICAL EXAM:  Flowsheet Row Most Recent Value  Orbital Region Severe depletion  Upper Arm Region Severe depletion  Thoracic and Lumbar Region Severe depletion  Buccal Region Severe depletion   Temple Region Moderate depletion  Clavicle Bone Region Severe depletion  Clavicle and Acromion Bone Region Severe depletion  Scapular Bone Region Severe depletion  Dorsal Hand Severe depletion  Patellar Region Severe depletion  Anterior Thigh Region Severe depletion  Posterior Calf Region Severe depletion  Edema (RD Assessment) None  Hair Reviewed  Eyes Reviewed  Mouth Reviewed  Skin Reviewed  Nails Reviewed   Diet Order:   Diet Order             Diet regular Room service appropriate? Yes with Assist; Fluid consistency: Thin  Diet effective now                   EDUCATION NEEDS:   Education needs have been addressed  Skin:  Skin Assessment: Reviewed RN Assessment  Last BM:  Unknown  Height:   Ht Readings from Last 1 Encounters:  12/20/22 5\' 2"  (1.575 m)    Weight:   Wt Readings from Last 1 Encounters:  12/20/22 34 kg    Ideal Body Weight:  50 kg  BMI:  Body mass index is 13.71 kg/m.  Estimated Nutritional Needs:  Kcal:  1500-1700 Protein:  75-95 grams Fluid:  >/= 1.5 L   Kirby Crigler RD, LDN Clinical Dietitian See [facility] for contact information.

## 2022-12-21 NOTE — TOC Initial Note (Signed)
Transition of Care [facility]) - Initial/Assessment Note    Patient Details  Name: [name] MRN: [id number] Date of Birth: 12/14/1928  Transition of Care [facility]) CM/SW Contact:    Lawerance Sabal, RN Phone Number: 12/21/2022, 2:27 PM  Clinical Narrative:                  Chart reviewed. Patient from home w daughter.  Patient is active w [facility] services and [facility] for palliative services.  Both agencies aware of admission. TOC will continue to follow. Expected Discharge Plan: Home w Home Health Services Barriers to Discharge: Continued Medical Work up   Patient Goals and CMS Choice            Expected Discharge Plan and Services   Discharge Planning Services: CM Consult   Living arrangements for the past 2 months: Single Family Home                                      Prior Living Arrangements/Services Living arrangements for the past 2 months: Single Family Home Lives with:: Adult Children                   Activities of Daily Living      Permission Sought/Granted                  Emotional Assessment              Admission diagnosis:  Acute cystitis with hematuria [N30.01] Acute metabolic encephalopathy [G93.41] Patient Active Problem List   Diagnosis Date Noted   Acute metabolic encephalopathy 12/20/2022   History of stroke 10/19/2022   UTI (urinary tract infection) 10/19/2022   Fall at home, initial encounter 10/19/2022   Fall 10/19/2022   CVA (cerebral vascular accident) (HCC) 10/18/2022   COPD (chronic obstructive pulmonary disease) (HCC) 09/06/2022   Paronychia of great toe of left foot 08/22/2022   Chronic systolic CHF (congestive heart failure) (HCC) 02/18/2022   DNR (do not resuscitate) 02/18/2022   Choledocholithiasis with acute cholecystitis 01/26/2022   Elevated troponin 01/03/2022   Secondary hypercoagulable state (HCC) 02/04/2021   Unexplained weight loss 01/21/2021   Protein-calorie malnutrition,  severe 01/16/2021   Persistent atrial fibrillation (HCC) 01/14/2021   Sick sinus syndrome (HCC) 01/14/2021   Syncope 01/14/2021   Anemia 01/14/2021   Weakness 01/14/2021   GI bleed 04/22/2020   Hypokalemia    Presbycusis of both ears 11/13/2019   Vertigo 03/27/2017   Arthritis 03/27/2017   Stroke (HCC)    Status post transcatheter aortic valve replacement (TAVR) using bioprosthesis 01/03/2017   Chronic apical periodontitis 12/28/2016   Retained dental roots 12/28/2016   Bilateral maxillary lateral exostoses 12/28/2016   Chronic diastolic CHF (congestive heart failure) (HCC)    Chronic stable angina    CAD (coronary artery disease) of artery bypass graft    Tricuspid regurgitation    Pulmonary regurgitation    Left shoulder pain    SOB (shortness of breath)    Back pain-similar to pt's angina 12/01/2015   Carotid artery disease- s/p RCE 12/01/2015   Bowen's disease 03/16/2015   Dependent edema 10/30/2014   History of allergy to aspirin 06/02/2014   PAF (paroxysmal atrial fibrillation) (HCC)    Gastroesophageal reflux disease without esophagitis 12/05/2011   Hx of CABG 2006 with Maze 07/21/2011   Mitral regurgitation 07/21/2011   Allergic rhinitis 10/05/2010  Dyslipidemia 10/05/2010   Uncomplicated asthma 10/19/2007   PCP:  Ronnald Nian, MD Pharmacy:   [facility] [facility] [address] - [place], [place] - [address] [facility] [address] [facility] [place] [address] Phone: [phone] Fax: [phone]  [facility] [facility] [address] - [place], [place] - [address] [facility] [address] [facility] [place] [address] Phone: [phone] Fax: [phone]  [id number] [place], [place] - [address] AT [facility] [address] [facility] [address] [place] [address] Phone: [phone] Fax: [phone]     Social Determinants of Health (SDOH) Social History: SDOH Screenings   Food Insecurity: No Food Insecurity (09/06/2022)   Housing: Low Risk  (09/06/2022)  Transportation Needs: No Transportation Needs (09/06/2022)  Utilities: Not At Risk (09/06/2022)  Depression (PHQ2-9): Low Risk  (04/22/2022)  Financial Resource Strain: Low Risk  (04/22/2022)  Physical Activity: Inactive (04/22/2022)  Stress: No Stress Concern Present (04/22/2022)  Tobacco Use: Medium Risk (12/20/2022)   SDOH Interventions:     Readmission Risk Interventions    12/30/2021   11:52 AM  Readmission Risk Prevention Plan  Post Dischage Appt Complete  Medication Screening Complete  Transportation Screening Complete

## 2022-12-21 NOTE — Evaluation (Signed)
Occupational Therapy Evaluation Patient Details Name: [name] MRN: [id number] DOB: 1928/07/16 Today's Date: 12/21/2022   History of Present Illness 89 y.o. female presenting with AM    Medical history significant of  aphasia,m CAD s/p CABG, atrial fib s/p MAZE procedure, CVA, carotid artery disease s/p right CEA, fibromyalgia, GERD, mild to moderate due to Choledocholithiasis. MR, severe AS s/p TAVR and sick sinus syndrome s/p pacemaker, diastolic dysfunction,HTN, HLD Asthma   Clinical Impression   Pt admitted for above dx, PTA pt had assist with mobility and ADLs, daughter assisted with pivot transfers. Pt currently presenting with failure to thrive and generalized weakness, she needs Mod A +2 for OOB mobility due to impaired balance, could be min A for stand pivots but needs more assist when trying to get pt to engage in steps. Pt would benefit from continued acute skilled OT services to address deficits and help transition to next level of care. Patient would benefit from post acute Home OT services to help maximize functional independence in natural environment since she has support of daughter and good home resources.       Recommendations for follow up therapy are one component of a multi-disciplinary discharge planning process, led by the attending physician.  Recommendations may be updated based on patient status, additional functional criteria and insurance authorization.   Assistance Recommended at Discharge Frequent or constant Supervision/Assistance  Patient can return home with the following A lot of help with walking and/or transfers;A lot of help with bathing/dressing/bathroom;Direct supervision/assist for medications management;Direct supervision/assist for financial management;Assist for transportation;Assistance with cooking/housework;Help with stairs or ramp for entrance    Functional Status Assessment  Patient has had a recent decline in their functional status and  demonstrates the ability to make significant improvements in function in a reasonable and predictable amount of time.  Equipment Recommendations  None recommended by OT (Pt has rec DME)    Recommendations for Other Services       Precautions / Restrictions Precautions Precautions: Fall Restrictions Weight Bearing Restrictions: No      Mobility Bed Mobility Overal bed mobility: Needs Assistance Bed Mobility: Supine to Sit     Supine to sit: Min assist     General bed mobility comments: Pt left sitting in recliner, needing min A to scoot forward to EOB but able to position self in sitting position with min guard to supervision    Transfers Overall transfer level: Needs assistance Equipment used: Rolling walker (2 wheels) Transfers: Sit to/from Stand, Bed to chair/wheelchair/BSC Sit to Stand: Min assist, +2 physical assistance     Step pivot transfers: Mod assist, +2 physical assistance, +2 safety/equipment     General transfer comment: STSx2 with RW, one STS with HHA min A +2      Balance Overall balance assessment: Needs assistance Sitting-balance support: Feet supported, No upper extremity supported Sitting balance-[name] Scale: Fair     Standing balance support: Bilateral upper extremity supported, During functional activity, Reliant on assistive device for balance Standing balance-[name] Scale: Poor Standing balance comment: Reliant on ext support                           ADL either performed or assessed with clinical judgement   ADL Overall ADL's : Needs assistance/impaired Eating/Feeding: Independent;Bed level   Grooming: Sitting;Min guard   Upper Body Bathing: Sitting;Set up;Min guard   Lower Body Bathing: Sitting/lateral leans;Moderate assistance   Upper Body Dressing : Sitting;Set up;Min guard  Lower Body Dressing: Sitting/lateral leans;Maximal assistance   Toilet Transfer: +2 for physical assistance;Moderate assistance;Stand-pivot    Toileting- Clothing Manipulation and Hygiene: Moderate assistance;Sit to/from stand         General ADL Comments: Pt unable to gather balance and initate true steps despite OT/PT assist     Vision         Perception     Praxis      Pertinent Vitals/Pain Pain Assessment Pain Assessment: No/denies pain     Hand Dominance Right   Extremity/Trunk Assessment Upper Extremity Assessment Upper Extremity Assessment: Generalized weakness   Lower Extremity Assessment Lower Extremity Assessment: Defer to PT evaluation   Cervical / Trunk Assessment Cervical / Trunk Assessment: Other exceptions Cervical / Trunk Exceptions: malnurished   Communication Communication Communication: HOH   Cognition Arousal/Alertness: Awake/alert Behavior During Therapy: WFL for tasks assessed/performed Overall Cognitive Status: History of cognitive impairments - at baseline                                 General Comments: Pt seems somewhat impulsive, makes gestures to stand without assist. Has a tangential speech pattern at baseline     General Comments  VSS on RA, daughter present    Exercises     Shoulder Instructions      Home Living Family/patient expects to be discharged to:: Private residence Living Arrangements: Children Available Help at Discharge: Family;Available 24 hours/day Type of Home: House Home Access: Ramped entrance (3 thresholds to get in home, not like steps per daughter report.)     Home Layout: One level     Bathroom Shower/Tub: Chief Strategy Officer: Standard Bathroom Accessibility: No   Home Equipment: Agricultural consultant (2 wheels);Cane - single point;BSC/3in1;Other (comment);Transport chair (gait belt)   Additional Comments: pt does not go into bathroom, dtr assists with sponge bathing. Receives HH therapy, gets private transport to get in/out of home  Lives With: Daughter (daughter has bad rotator cuff)    Prior  Functioning/Environment Prior Level of Function : Needs assist       Physical Assist : Mobility (physical) Mobility (physical): Transfers   Mobility Comments: uses transport chair, was working with HHPT to ambulate short distances at home ADLs Comments: pt's daughter assists with ADL primarily bathing and dressing, assists with IADL as well        OT Problem List: Impaired balance (sitting and/or standing);Decreased strength      OT Treatment/Interventions: Self-care/ADL training;Therapeutic activities;Therapeutic exercise;Patient/family education;DME and/or AE instruction;Balance training    OT Goals(Current goals can be found in the care plan section) Acute Rehab OT Goals Patient Stated Goal: To go home OT Goal Formulation: With patient/family Time For Goal Achievement: 01/04/23 Potential to Achieve Goals: Good ADL Goals Pt Will Perform Grooming: sitting;with supervision;with set-up Pt Will Perform Lower Body Bathing: sitting/lateral leans;with min guard assist Pt Will Perform Lower Body Dressing: with min guard assist;sitting/lateral leans Pt Will Transfer to Toilet: stand pivot transfer;with min assist  OT Frequency: Min 1X/week    Co-evaluation              AM-PAC OT "6 Clicks" Daily Activity     Outcome Measure Help from another person eating meals?: None Help from another person taking care of personal grooming?: A Little Help from another person toileting, which includes using toliet, bedpan, or urinal?: A Little Help from another person bathing (including washing, rinsing, drying)?: A Lot Help  from another person to put on and taking off regular upper body clothing?: A Little Help from another person to put on and taking off regular lower body clothing?: A Lot 6 Click Score: 17   End of Session Equipment Utilized During Treatment: Gait belt;Rolling walker (2 wheels) Nurse Communication: Mobility status  Activity Tolerance: Patient tolerated treatment  well Patient left: in chair;with call bell/phone within reach;with chair alarm set  OT Visit Diagnosis: Unsteadiness on feet (R26.81);Other abnormalities of gait and mobility (R26.89);Adult, failure to thrive (R62.7)                Time: 6578-4696 OT Time Calculation (min): 36 min Charges:  OT General Charges $OT Visit: 1 Visit OT Evaluation $OT Eval Moderate Complexity: 1 Mod  12/21/2022  AB, [name]  Acute Rehabilitation Services  Office: [phone]   VONCILE DENBOW 12/21/2022, 4:28 PM

## 2022-12-21 NOTE — Progress Notes (Addendum)
[facility] PROGRESS NOTE   [name] ZOX:[id number] DOB: 1929-01-07 DOA: 12/20/2022  PCP: Lori Nian, MD  Brief History/Interval Summary: 89 y.o. female with medical history significant of TAVR, CAD s/p CABG, carotid disease s/p R CEA, HFpEF, HTN, HLD, afib, and CVA with persistent aphasia presenting with AMS.  Her daughter reports that the last few days she has not been eating a lot, drinking a lot of water, more tired than usual.  She is periodically agitated.  She fell in May, has not been getting up to the bedside commode as well.  She has had home health nursing, daughter is trying to care for her at home.     Consultants: None  Procedures: None    Subjective/Interval History: Patient pleasantly confused.  Daughter is at the bedside.  Patient denies any abdominal pain nausea or vomiting.    Assessment/Plan:  Acute metabolic encephalopathy/history of dementia Patient does have underlying dementia.  But she was thought to be more confused than her baseline.  Urine was noted to be abnormal.  Continue treatment for UTI.  Does not have any focal neurological deficits.  She has had falls frequently.  Will involve PT and OT.  Urinary tract infection Continue ceftriaxone.  Follow-up on cultures.  History of coronary artery disease status post CABG/peripheral artery disease/status post CEA Stable.  Hypokalemia Will be repleted.  Magnesium is 1.8.  Chronic diastolic CHF Stable.  Hyperlipidemia Continue statin.  Atrial fibrillation/pacemaker in situ Stable.  Continue Eliquis.  Continue amiodarone  History of stroke with aphasia Stable.  Close to baseline.   DVT Prophylaxis: On apixaban Code Status: DNR Family Communication: Discussed with daughter Disposition Plan: Hopefully return home when improved  Status is: Observation The patient will require care spanning > 2 midnights and should be moved to inpatient because: Altered mental status secondary  to UTI requiring IV antibiotics      Medications: Scheduled:  amiodarone  200 mg Oral Daily   apixaban  2.5 mg Oral BID   budesonide  0.5 mg Nebulization BID   docusate sodium  100 mg Oral BID   linaclotide  72 mcg Oral Daily   loratadine  10 mg Oral Daily   pantoprazole  40 mg Oral BID   rosuvastatin  20 mg Oral QHS   sodium chloride flush  3 mL Intravenous Q12H   Continuous:  cefTRIAXone (ROCEPHIN)  IV 2 g (12/21/22 1132)   lactated ringers 75 mL/hr at 12/21/22 8119   JYN:[id number] **OR** acetaminophen, albuterol, bisacodyl, hydrALAZINE, hydrOXYzine, ondansetron **OR** ondansetron (ZOFRAN) IV, polyethylene glycol, tetrahydrozoline  Antibiotics: Anti-infectives (From admission, onward)    Start     Dose/Rate Route Frequency Ordered Stop   12/21/22 1200  cefTRIAXone (ROCEPHIN) 2 g in sodium chloride 0.9 % 100 mL IVPB        2 g 200 mL/hr over 30 Minutes Intravenous Every 24 hours 12/20/22 1540     12/20/22 1145  cefTRIAXone (ROCEPHIN) 2 g in sodium chloride 0.9 % 100 mL IVPB        2 g 200 mL/hr over 30 Minutes Intravenous  Once 12/20/22 1140 12/20/22 1227       Objective:  Vital Signs  Vitals:   12/20/22 2110 12/20/22 2135 12/21/22 0318 12/21/22 0813  BP:  137/69 139/62 (!) 156/72  Pulse: 70 81 79 77  Resp: 16 16  16   Temp:  98.4 F (36.9 C) 98.3 F (36.8 C) 98.2 F (36.8 C)  TempSrc:  Oral  Oral  SpO2: 100% 97% 98% 97%  Weight:      Height:       No intake or output data in the 24 hours ending 12/21/22 1136 Filed Weights   12/20/22 0818  Weight: 34 kg    General appearance: Awake alert.  In no distress.  Distracted Resp: Clear to auscultation bilaterally.  Normal effort Cardio: S1-S2 is normal regular.  No S3-S4.  No rubs murmurs or bruit GI: Abdomen is soft.  Nontender nondistended.  Bowel sounds are present normal.  No masses organomegaly No obvious focal neural logical deficits.   Lab Results:  Data Reviewed: I have personally reviewed  following labs and reports of the imaging studies  CBC: Recent Labs  Lab 12/20/22 0840 12/20/22 0856 12/21/22 0026  WBC 8.9  --  10.5  NEUTROABS 6.2  --   --   HGB 14.0 14.6 12.2  HCT 43.3 43.0 37.5  MCV 90.2  --  88.9  PLT 218  --  181    Basic Metabolic Panel: Recent Labs  Lab 12/20/22 0840 12/20/22 0856 12/21/22 0022 12/21/22 0026  NA 133* 133*  --  135  K 3.0* 3.1*  --  3.0*  CL 92*  --   --  93*  CO2 29  --   --  30  GLUCOSE 110*  --   --  100*  BUN 5*  --   --  5*  CREATININE 0.47  --   --  0.50  CALCIUM 9.4  --   --  9.1  MG  --   --  1.8  --     GFR: Estimated Creatinine Clearance: 24.6 mL/min (by C-G formula based on SCr of 0.5 mg/dL).  Liver Function Tests: Recent Labs  Lab 12/20/22 0840  AST 32  ALT 32  ALKPHOS 43  BILITOT 0.8  PROT 6.3*  ALBUMIN 3.8     Recent Results (from the past 240 hour(s))  Urine Culture     Status: Abnormal (Preliminary result)   Collection Time: 12/20/22 10:57 AM   Specimen: Urine, Random  Result Value Ref Range Status   Specimen Description URINE, RANDOM  Final   Special Requests NONE Reflexed from [id number]  Final   Culture (A)  Final    >=100,000 COLONIES/mL GRAM NEGATIVE RODS SUSCEPTIBILITIES TO FOLLOW Performed at [facility], [address], [place], [place] [address]    Report Status PENDING  Incomplete      Radiology Studies: DG Chest Portable 1 View  Result Date: 12/20/2022 CLINICAL DATA:  Shortness of breath.  Aphasia. EXAM: PORTABLE CHEST 1 VIEW COMPARISON:  10/18/2022 FINDINGS: Previous median sternotomy and CABG. Previous TAVR. Dual lead pacemaker in place. The right lung is clear. Chronic markings at the left lung base appears similar to prior studies. No evidence of heart failure or effusion. IMPRESSION: Previous median sternotomy and CABG. Previous TAVR. Dual lead pacemaker. Chronic markings at the left lung base. Electronically Signed   By: Paulina Fusi M.D.   On: 12/20/2022 08:40        LOS: 0 days   [name] Rito Ehrlich  [facility] Pager on [email or web address]  12/21/2022, 11:36 AM

## 2022-12-21 NOTE — Evaluation (Addendum)
Speech Language Pathology Evaluation Patient Details Name: [name] MRN: [id number] DOB: [birth year] Today's Date: 12/21/2022 Time: 1415-1445 SLP Time Calculation (min) (ACUTE ONLY): 30 min  Problem List:  Patient Active Problem List   Diagnosis Date Noted   Acute metabolic encephalopathy 12/20/2022   History of stroke 10/19/2022   UTI (urinary tract infection) 10/19/2022   Fall at home, initial encounter 10/19/2022   Fall 10/19/2022   CVA (cerebral vascular accident) (HCC) 10/18/2022   COPD (chronic obstructive pulmonary disease) (HCC) 09/06/2022   Paronychia of great toe of left foot 08/22/2022   Chronic systolic CHF (congestive heart failure) (HCC) 02/18/2022   DNR (do not resuscitate) 02/18/2022   Choledocholithiasis with acute cholecystitis 01/26/2022   Elevated troponin 01/03/2022   Secondary hypercoagulable state (HCC) 02/04/2021   Unexplained weight loss 01/21/2021   Protein-calorie malnutrition, severe 01/16/2021   Persistent atrial fibrillation (HCC) 01/14/2021   Sick sinus syndrome (HCC) 01/14/2021   Syncope 01/14/2021   Anemia 01/14/2021   Weakness 01/14/2021   GI bleed 04/22/2020   Hypokalemia    Presbycusis of both ears 11/13/2019   Vertigo 03/27/2017   Arthritis 03/27/2017   Stroke (HCC)    Status post transcatheter aortic valve replacement (TAVR) using bioprosthesis 01/03/2017   Chronic apical periodontitis 12/28/2016   Retained dental roots 12/28/2016   Bilateral maxillary lateral exostoses 12/28/2016   Chronic diastolic CHF (congestive heart failure) (HCC)    Chronic stable angina    CAD (coronary artery disease) of artery bypass graft    Tricuspid regurgitation    Pulmonary regurgitation    Left shoulder pain    SOB (shortness of breath)    Back pain-similar to pt's angina 12/01/2015   Carotid artery disease- s/p RCE 12/01/2015   Bowen's disease 03/16/2015   Dependent edema 10/30/2014   History of allergy to aspirin 06/02/2014   PAF  (paroxysmal atrial fibrillation) (HCC)    Gastroesophageal reflux disease without esophagitis 12/05/2011   Hx of CABG 2006 with Maze 07/21/2011   Mitral regurgitation 07/21/2011   Allergic rhinitis 10/05/2010   Dyslipidemia 10/05/2010   Uncomplicated asthma 10/19/2007   Past Medical History:  Past Medical History:  Diagnosis Date   Anxiety    Aortic Stenosis s/p TAVR    Echo 10/21: EF 55-60, no RWMA, mild asymmetric LVH, normal RVSF, RVSP 40.2 mmHg, mild MR, s/p TAVR with mean gradient 8.29mmHg, no PVL   Arthritis    OSTEO   Aspirin allergy    on Plavix   Asthma    CAD (coronary artery disease)    a. s/p CABG 2006 with Cox-Maze procedure.   Carotid artery disease (HCC)    a. s/p R CEA.   Diastolic dysfunction    Eczema    Fibromyalgia    GERD (gastroesophageal reflux disease)    Hiatal hernia    Hyperlipidemia    Hypertension    Kyphoscoliosis    Mitral regurgitation    PAF (paroxysmal atrial fibrillation) (HCC)    a. pt has h/o hematuria on Eliquis and has since refused anticoagulation   Pneumonia 2015 ?   Pulmonary regurgitation    Skin cancer of arm    Stroke [facility])    Tricuspid regurgitation    Past Surgical History:  Past Surgical History:  Procedure Laterality Date   ABDOMINAL HYSTERECTOMY  1976   CAROTID ENDARTERECTOMY  2010   CORONARY ANGIOPLASTY WITH STENT PLACEMENT     CORONARY ARTERY BYPASS GRAFT  01/2005   LIMA-D1; SVG-LAD; SVG-OM; SVG-PDA  ERCP N/A 12/28/2021   Procedure: ENDOSCOPIC RETROGRADE CHOLANGIOPANCREATOGRAPHY (ERCP);  Surgeon: Jeani Hawking, MD;  Location: [facility] ENDOSCOPY;  Service: Gastroenterology;  Laterality: N/A;   ESOPHAGOGASTRODUODENOSCOPY (EGD) WITH PROPOFOL N/A 03/19/2021   Procedure: ESOPHAGOGASTRODUODENOSCOPY (EGD) WITH PROPOFOL;  Surgeon: Jeani Hawking, MD;  Location: [facility] ENDOSCOPY;  Service: Endoscopy;  Laterality: N/A;   EYE SURGERY     MULTIPLE EXTRACTIONS WITH ALVEOLOPLASTY  12/28/2016   Extraction of tooth #'s 2- 5,7-10,  12,13,17-20,and 22-29 with alveoloplasty and maxillary right and left lateral exostoses reductions   MULTIPLE EXTRACTIONS WITH ALVEOLOPLASTY N/A 12/28/2016   Procedure: Extraction of tooth #'s 2- 5,7-10, 12,13,17-20,and 22-29 with alveoloplasty and maxillary right and left lateral exostoses reductions;  Surgeon: Charlynne Pander, DDS;  Location: [facility] OR;  Service: Oral Surgery;  Laterality: N/A;   PACEMAKER IMPLANT N/A 01/15/2021   Procedure: PACEMAKER IMPLANT;  Surgeon: Regan Lemming, MD;  Location: [facility] INVASIVE CV LAB;  Service: Cardiovascular;  Laterality: N/A;   REMOVAL OF STONES  12/28/2021   Procedure: REMOVAL OF STONES;  Surgeon: Jeani Hawking, MD;  Location: [facility] ENDOSCOPY;  Service: Gastroenterology;;   RIGHT/LEFT HEART CATH AND CORONARY/GRAFT ANGIOGRAPHY N/A 10/27/2016   Procedure: Right/Left Heart Cath and Coronary/Graft Angiography;  Surgeon: Kathleene Hazel, MD;  Location: [facility] INVASIVE CV LAB;  Service: Cardiovascular;  Laterality: N/A;   SAVORY DILATION N/A 03/19/2021   Procedure: SAVORY DILATION;  Surgeon: Jeani Hawking, MD;  Location: [facility] ENDOSCOPY;  Service: Endoscopy;  Laterality: N/A;   SPHINCTEROTOMY  12/28/2021   Procedure: SPHINCTEROTOMY;  Surgeon: Jeani Hawking, MD;  Location: [facility] ENDOSCOPY;  Service: Gastroenterology;;   TEE WITHOUT CARDIOVERSION N/A 01/03/2017   Procedure: TRANSESOPHAGEAL ECHOCARDIOGRAM (TEE);  Surgeon: Kathleene Hazel, MD;  Location: [facility] OR;  Service: Open Heart Surgery;  Laterality: N/A;   TONSILLECTOMY     TRANSCATHETER AORTIC VALVE REPLACEMENT, TRANSFEMORAL N/A 01/03/2017   Procedure: TRANSCATHETER AORTIC VALVE REPLACEMENT, TRANSFEMORAL;  Surgeon: Kathleene Hazel, MD;  Location: [facility] OR;  Service: Open Heart Surgery;  Laterality: N/A;   TUMOR REMOVAL     HPI:  [name] is a 89 yo female presenting to ED 7/30 with AMS. MRI Brain with no acute intracranial abnormality, but note small chronic infarcts in L frontal lobe, bilateral centrum  semiovale, L corona radiata, and L cerebellar hemisphere. Seen by SLP 10/19/22 with significant neurocognitive impairment evidenced by a score of 6/30 on the SLUMS. At that time, daughter reported pt was near her baseline. PMH includes TAVR, CAD s/p CABG, carotid disease s/p R CEA, HFpEF, HTN, HLD, A-fib, dementia, prior CVA with persistent aphasia   Assessment / Plan / Recommendation Clinical Impression  Pt and her daughter report feeling close to baseline for cognition and speech. Pt lives with her daughter who provides full time care for pt. In May 2024, pt scored a 6/30 on the SLUMS and was reportedly at her baseline. Today, pt scored 12/30 (27 and above considered WFL) and reports no concern with cogntion. Suspect pt's performance is greatly impacted by her prior strokes and underlying dementia. Pt has vision and hearing deficits, which did not seem to affect her performance today. SLP provided education regarding additional SLP f/u if pt and her daughter were interested or noticed acute changes in cogntion, which they politely declined. Since pt is reportedly at her baseline, no further SLP f/u is necessary at this time. Will s/o.    SLP Assessment  SLP Recommendation/Assessment: Patient does not need any further Speech [facility] Pathology Services SLP Visit  Diagnosis: Aphasia (R47.01);Cognitive communication deficit (R41.841)    Recommendations for follow up therapy are one component of a multi-disciplinary discharge planning process, led by the attending physician.  Recommendations may be updated based on patient status, additional functional criteria and insurance authorization.    Follow Up Recommendations  No SLP follow up    Assistance Recommended at Discharge  Frequent or constant Supervision/Assistance  Functional Status Assessment Patient has not had a recent decline in their functional status  Frequency and Duration           SLP Evaluation Cognition  Overall Cognitive Status:  History of cognitive impairments - at baseline       Comprehension  Auditory Comprehension Overall Auditory Comprehension: Impaired at baseline    Expression Expression Primary Mode of Expression: Verbal Verbal Expression Overall Verbal Expression: Impaired at baseline Written Expression Dominant Hand: Right   Oral / Motor  Oral Motor/Sensory Function Overall Oral Motor/Sensory Function: Within functional limits Motor Speech Overall Motor Speech: Impaired at baseline            Gwynneth Aliment, M.A., CF-SLP Speech Language Pathology, Acute Rehabilitation Services  Secure Chat preferred [phone]  12/21/2022, 3:07 PM

## 2022-12-22 DIAGNOSIS — E876 Hypokalemia: Secondary | ICD-10-CM | POA: Diagnosis not present

## 2022-12-22 DIAGNOSIS — G9341 Metabolic encephalopathy: Secondary | ICD-10-CM | POA: Diagnosis not present

## 2022-12-22 DIAGNOSIS — N3001 Acute cystitis with hematuria: Secondary | ICD-10-CM | POA: Diagnosis not present

## 2022-12-22 MED ORDER — MEDIHONEY WOUND/BURN DRESSING EX PSTE
1.0000 | PASTE | Freq: Every day | CUTANEOUS | Status: DC
  Administered 2022-12-22 – 2022-12-23 (×2): 1 via TOPICAL
  Filled 2022-12-22: qty 44

## 2022-12-22 MED ORDER — CEPHALEXIN 500 MG PO CAPS
500.0000 mg | ORAL_CAPSULE | Freq: Three times a day (TID) | ORAL | Status: DC
  Administered 2022-12-22 – 2022-12-23 (×3): 500 mg via ORAL
  Filled 2022-12-22 (×3): qty 1

## 2022-12-22 NOTE — Consult Note (Signed)
WOC Nurse Consult Note: daughter in room says patient has had pressure injuries since being hospitalized in May then going to SNF; daughter plans to take patient home at discharge and states patient has hospital bed with gel overlay  Reason for Consult: sacral pressure injury  Wound type: Unstageable Pressure Injury x 2 L buttock  Pressure Injury POA: Yes Measurement:  Unstageable PI x 2 separate areas more lateral L buttock 1 cm x 0.8 cm 100% yellow slough, distal to this area 0.5 cm x 0.5 cm unstageable PI 100% yellow slough   Drainage (amount, consistency, odor) minimal tan exudate  Periwound: erythema, some moisture associated skin damage with scattered partial thickness skin loss R buttock  Dressing procedure/placement/frequency:  Clean bilateral buttocks/sacrum with soap and water, dry. Apply Medihoney to L buttock unstageable PI wound beds (yellow tissue) and cover with dry gauze, coat remaining area sacrum/buttocks with floor stock (purple top) moisture barrier cream.  Top with ABD pad or silicone foam whichever is preferred.    Patient would benefit from low air loss mattress for pressure redistribution and moisture management.  This area is also very uncomfortable for patient and low air loss mattress will help with comfort.   POC discussed with patients daughter and bedside staff.  WOC team will not follow.  Re-consult if further needs arise.   Thank you,     Priscella Mann MSN, RN-BC, [employer] [phone]

## 2022-12-22 NOTE — Plan of Care (Signed)

## 2022-12-22 NOTE — Progress Notes (Signed)
[facility] PROGRESS NOTE   [name] ZOX:[id number] DOB: [birth year] DOA: 12/20/2022  PCP: Ronnald Nian, MD  Brief History/Interval Summary: 89 y.o. female with medical history significant of TAVR, CAD s/p CABG, carotid disease s/p R CEA, HFpEF, HTN, HLD, afib, and CVA with persistent aphasia presenting with AMS.  Her daughter reports that the last few days she has not been eating a lot, drinking a lot of water, more tired than usual.  She is periodically agitated.  She fell in May, has not been getting up to the bedside commode as well.  She has had home health nursing, daughter is trying to care for her at home.     Consultants: None  Procedures: None    Subjective/Interval History: Patient remains pleasantly confused.  She also has a history of aphasia at baseline which makes communication difficult.  Her daughter is at the bedside.  No new issues reported.  No complaints offered this morning.     Assessment/Plan:  Acute metabolic encephalopathy/history of dementia Patient does have underlying dementia.  But she was thought to be more confused than her baseline.  Urine was noted to be abnormal.  Continue treatment for UTI.  Does not have any focal neurological deficits.  She has had falls frequently.  It is recommended. Mentation appears to be gradually improving.    Urinary tract infection Urine is growing E. coli.  Noted to be pansensitive.  Will change from ceftriaxone to cephalexin.    History of coronary artery disease status post CABG/peripheral artery disease/status post CEA Stable.  Hypokalemia Supplemented.  Normal today.    Chronic diastolic CHF Stable.  Hyperlipidemia Continue statin.  Atrial fibrillation/pacemaker in situ Stable.  Continue Eliquis.  Continue amiodarone  History of stroke with aphasia Stable.  Close to baseline.   DVT Prophylaxis: On apixaban Code Status: DNR Family Communication: Discussed with daughter Disposition  Plan: Hopefully return home when improved       Medications: Scheduled:  amiodarone  200 mg Oral Daily   apixaban  2.5 mg Oral BID   budesonide  0.5 mg Nebulization BID   docusate sodium  100 mg Oral BID   feeding supplement  237 mL Oral BID BM   linaclotide  72 mcg Oral Daily   loratadine  10 mg Oral Daily   multivitamin with minerals  1 tablet Oral Daily   pantoprazole  40 mg Oral BID   rosuvastatin  20 mg Oral QHS   sodium chloride flush  3 mL Intravenous Q12H   Continuous:  cefTRIAXone (ROCEPHIN)  IV Stopped (12/21/22 1230)   lactated ringers 75 mL/hr at 12/21/22 8119   JYN:[id number] **OR** acetaminophen, albuterol, bisacodyl, hydrALAZINE, hydrOXYzine, ondansetron **OR** ondansetron (ZOFRAN) IV, polyethylene glycol, tetrahydrozoline  Antibiotics: Anti-infectives (From admission, onward)    Start     Dose/Rate Route Frequency Ordered Stop   12/21/22 1200  cefTRIAXone (ROCEPHIN) 2 g in sodium chloride 0.9 % 100 mL IVPB        2 g 200 mL/hr over 30 Minutes Intravenous Every 24 hours 12/20/22 1540     12/20/22 1145  cefTRIAXone (ROCEPHIN) 2 g in sodium chloride 0.9 % 100 mL IVPB        2 g 200 mL/hr over 30 Minutes Intravenous  Once 12/20/22 1140 12/20/22 1227       Objective:  Vital Signs  Vitals:   12/21/22 1548 12/21/22 2011 12/21/22 2125 12/22/22 0545  BP: 129/61  126/64 (!) 105/54  Pulse: 70  70  70  Resp: 16     Temp: 98.2 F (36.8 C)  98.2 F (36.8 C) 98.1 F (36.7 C)  TempSrc: Oral  Oral Oral  SpO2: 100% 98% 98% 99%  Weight:      Height:        Intake/Output Summary (Last 24 hours) at 12/22/2022 0933 Last data filed at 12/22/2022 0844 Gross per 24 hour  Intake 1865.15 ml  Output 200 ml  Net 1665.15 ml   Filed Weights   12/20/22 0818  Weight: 34 kg    General appearance: Awake alert.  In no distress.  Distracted Resp: Clear to auscultation bilaterally.  Normal effort Cardio: S1-S2 is normal regular.  No S3-S4.  No rubs murmurs or  bruit GI: Abdomen is soft.  Nontender nondistended.  Bowel sounds are present normal.  No masses organomegaly    Lab Results:  Data Reviewed: I have personally reviewed following labs and reports of the imaging studies  CBC: Recent Labs  Lab 12/20/22 0840 12/20/22 0856 12/21/22 0026 12/22/22 0600  WBC 8.9  --  10.5 6.5  NEUTROABS 6.2  --   --   --   HGB 14.0 14.6 12.2 12.9  HCT 43.3 43.0 37.5 39.1  MCV 90.2  --  88.9 92.2  PLT 218  --  181 164    Basic Metabolic Panel: Recent Labs  Lab 12/20/22 0840 12/20/22 0856 12/21/22 0022 12/21/22 0026 12/22/22 0600  NA 133* 133*  --  135 132*  K 3.0* 3.1*  --  3.0* 4.0  CL 92*  --   --  93* 97*  CO2 29  --   --  30 26  GLUCOSE 110*  --   --  100* 98  BUN 5*  --   --  5* 5*  CREATININE 0.47  --   --  0.50 0.46  CALCIUM 9.4  --   --  9.1 8.7*  MG  --   --  1.8  --   --     GFR: Estimated Creatinine Clearance: 24.6 mL/min (by C-G formula based on SCr of 0.46 mg/dL).  Liver Function Tests: Recent Labs  Lab 12/20/22 0840  AST 32  ALT 32  ALKPHOS 43  BILITOT 0.8  PROT 6.3*  ALBUMIN 3.8     Recent Results (from the past 240 hour(s))  Urine Culture     Status: Abnormal   Collection Time: 12/20/22 10:57 AM   Specimen: Urine, Random  Result Value Ref Range Status   Specimen Description URINE, RANDOM  Final   Special Requests   Final    NONE Reflexed from [id number] Performed at [facility], [address], [place], [place] [address]    Culture >=100,000 COLONIES/mL ESCHERICHIA COLI (A)  Final   Report Status 12/22/2022 FINAL  Final   Organism ID, Bacteria ESCHERICHIA COLI (A)  Final      Susceptibility   Escherichia coli - MIC*    AMPICILLIN <=2 SENSITIVE Sensitive     CEFAZOLIN <=4 SENSITIVE Sensitive     CEFEPIME <=0.12 SENSITIVE Sensitive     CEFTRIAXONE <=0.25 SENSITIVE Sensitive     CIPROFLOXACIN <=0.25 SENSITIVE Sensitive     GENTAMICIN <=1 SENSITIVE Sensitive     IMIPENEM <=0.25 SENSITIVE Sensitive      NITROFURANTOIN <=16 SENSITIVE Sensitive     TRIMETH/SULFA <=20 SENSITIVE Sensitive     AMPICILLIN/SULBACTAM <=2 SENSITIVE Sensitive     PIP/TAZO <=4 SENSITIVE Sensitive     * >=100,000 COLONIES/mL  ESCHERICHIA COLI      Radiology Studies: No results found.     LOS: 1 day   Shanie Mauzy [employer] on [email or web address]  12/22/2022, 9:33 AM

## 2022-12-23 ENCOUNTER — Other Ambulatory Visit (HOSPITAL_COMMUNITY): Payer: Self-pay

## 2022-12-23 DIAGNOSIS — G9341 Metabolic encephalopathy: Secondary | ICD-10-CM | POA: Diagnosis not present

## 2022-12-23 MED ORDER — CEPHALEXIN 500 MG PO CAPS
500.0000 mg | ORAL_CAPSULE | Freq: Three times a day (TID) | ORAL | 0 refills | Status: AC
  Filled 2022-12-23: qty 15, 5d supply, fill #0

## 2022-12-23 NOTE — Discharge Summary (Signed)
[facility]  Physician Discharge Summary   Patient ID: [name] MRN: [id number] DOB/AGE: 87-May-1930 87 y.o.  Admit date: 12/20/2022 Discharge date: 12/23/2022    PCP: Ronnald Nian, MD  DISCHARGE DIAGNOSES:    Acute metabolic encephalopathy   Carotid artery disease- s/p RCE   Dyslipidemia   Hx of CABG 2006 with [facility]   Persistent atrial fibrillation (HCC)   UTI (urinary tract infection)   RECOMMENDATIONS FOR OUTPATIENT FOLLOW UP: Follow-up with PCP in 1 week   Home Health: PT OT Equipment/Devices: None  CODE STATUS: DNR  DISCHARGE CONDITION: fair  Diet recommendation: As before  INITIAL HISTORY: 89 y.o. female with medical history significant of TAVR, CAD s/p CABG, carotid disease s/p R CEA, HFpEF, HTN, HLD, afib, and CVA with persistent aphasia presenting with AMS.  Her daughter reports that the last few days she has not been eating a lot, drinking a lot of water, more tired than usual.  She is periodically agitated.  She fell in May, has not been getting up to the bedside commode as well.  She has had home health nursing, daughter is trying to care for her at home.     HOSPITAL COURSE:   Acute metabolic encephalopathy/history of dementia Patient does have underlying dementia.  But she was thought to be more confused than her baseline.  Urine was noted to be abnormal.  She was treated for UTI with improvement in mentation.  Back to baseline now.     Urinary tract infection Urine is growing E. coli.  Noted to be pansensitive.  Will change from ceftriaxone to cephalexin.     History of coronary artery disease status post CABG/peripheral artery disease/status post CEA Stable.   Hypokalemia Supplemented.      Chronic diastolic CHF Stable.   Hyperlipidemia Continue statin.   Atrial fibrillation/pacemaker in situ Stable.  Continue Eliquis.  Continue amiodarone   History of stroke with aphasia Stable.  Close to baseline.  Pressure injury left  buttock unstageable Pressure Injury 12/22/22 Buttocks Left;Medial Unstageable - Full thickness tissue loss in which the base of the injury is covered by slough (yellow, tan, gray, green or brown) and/or eschar (tan, brown or black) in the wound bed. 2 separate areas 1 cm x  (Active)  12/22/22 1059  Location: Buttocks  Location Orientation: Left;Medial  Staging: Unstageable - Full thickness tissue loss in which the base of the injury is covered by slough (yellow, tan, gray, green or brown) and/or eschar (tan, brown or black) in the wound bed.  Wound Description (Comments): 2 separate areas 1 cm x 0.8 cm and distal to this 0.5 cm x 0.5 cm 100% slough  Present on Admission: Yes    Severe protein calorie malnutrition Nutrition Problem: Severe Malnutrition Etiology: chronic illness Signs/Symptoms: severe muscle depletion, severe fat depletion  Patient is stable.  Okay for discharge home today.  Discussed with daughter.   PERTINENT LABS:  The results of significant diagnostics from this hospitalization (including imaging, microbiology, ancillary and laboratory) are listed below for reference.    Microbiology: Recent Results (from the past 240 hour(s))  Urine Culture     Status: Abnormal   Collection Time: 12/20/22 10:57 AM   Specimen: Urine, Random  Result Value Ref Range Status   Specimen Description URINE, RANDOM  Final   Special Requests   Final    NONE Reflexed from [id number] Performed at [facility], [address] [place], [place] [address]    Culture >=100,000  COLONIES/mL ESCHERICHIA COLI (A)  Final   Report Status 12/22/2022 FINAL  Final   Organism ID, Bacteria ESCHERICHIA COLI (A)  Final      Susceptibility   Escherichia coli - MIC*    AMPICILLIN <=2 SENSITIVE Sensitive     CEFAZOLIN <=4 SENSITIVE Sensitive     CEFEPIME <=0.12 SENSITIVE Sensitive     CEFTRIAXONE <=0.25 SENSITIVE Sensitive     CIPROFLOXACIN <=0.25 SENSITIVE Sensitive     GENTAMICIN <=1 SENSITIVE  Sensitive     IMIPENEM <=0.25 SENSITIVE Sensitive     NITROFURANTOIN <=16 SENSITIVE Sensitive     TRIMETH/SULFA <=20 SENSITIVE Sensitive     AMPICILLIN/SULBACTAM <=2 SENSITIVE Sensitive     PIP/TAZO <=4 SENSITIVE Sensitive     * >=100,000 COLONIES/mL ESCHERICHIA COLI     Labs:   Basic Metabolic Panel: Recent Labs  Lab 12/20/22 0840 12/20/22 0856 12/21/22 0022 12/21/22 0026 12/22/22 0600  NA 133* 133*  --  135 132*  K 3.0* 3.1*  --  3.0* 4.0  CL 92*  --   --  93* 97*  CO2 29  --   --  30 26  GLUCOSE 110*  --   --  100* 98  BUN 5*  --   --  5* 5*  CREATININE 0.47  --   --  0.50 0.46  CALCIUM 9.4  --   --  9.1 8.7*  MG  --   --  1.8  --   --    Liver Function Tests: Recent Labs  Lab 12/20/22 0840  AST 32  ALT 32  ALKPHOS 43  BILITOT 0.8  PROT 6.3*  ALBUMIN 3.8    CBC: Recent Labs  Lab 12/20/22 0840 12/20/22 0856 12/21/22 0026 12/22/22 0600  WBC 8.9  --  10.5 6.5  NEUTROABS 6.2  --   --   --   HGB 14.0 14.6 12.2 12.9  HCT 43.3 43.0 37.5 39.1  MCV 90.2  --  88.9 92.2  PLT 218  --  181 164      IMAGING STUDIES DG Chest Portable 1 View  Result Date: 12/20/2022 CLINICAL DATA:  Shortness of breath.  Aphasia. EXAM: PORTABLE CHEST 1 VIEW COMPARISON:  10/18/2022 FINDINGS: Previous median sternotomy and CABG. Previous TAVR. Dual lead pacemaker in place. The right lung is clear. Chronic markings at the left lung base appears similar to prior studies. No evidence of heart failure or effusion. IMPRESSION: Previous median sternotomy and CABG. Previous TAVR. Dual lead pacemaker. Chronic markings at the left lung base. Electronically Signed   By: Paulina Fusi M.D.   On: 12/20/2022 08:40    DISCHARGE EXAMINATION: Vitals:   12/22/22 2325 12/23/22 0431 12/23/22 0741 12/23/22 0827  BP:  117/66  (!) 95/54  Pulse:  69  70  Resp:    17  Temp:  98 F (36.7 C)  98.5 F (36.9 C)  TempSrc:    Oral  SpO2: 100% 98% 97% 96%  Weight:      Height:       General appearance:  Awake alert.  In no distress Resp: Clear to auscultation bilaterally.  Normal effort Cardio: S1-S2 is normal regular.  No S3-S4.  No rubs murmurs or bruit GI: Abdomen is soft.  Nontender nondistended.  Bowel sounds are present normal.  No masses organomegaly   DISPOSITION: Home with daughter  Discharge Instructions     Call MD for:  difficulty breathing, headache or visual disturbances   Complete by: As directed  Call MD for:  extreme fatigue   Complete by: As directed    Call MD for:  persistant dizziness or light-headedness   Complete by: As directed    Call MD for:  persistant nausea and vomiting   Complete by: As directed    Call MD for:  severe uncontrolled pain   Complete by: As directed    Call MD for:  temperature >100.4   Complete by: As directed    Diet - low sodium heart healthy   Complete by: As directed    Discharge instructions   Complete by: As directed    Please take your medications as prescribed.  Please be sure to follow-up with your primary care provider within 1 week.  You were cared for by a hospitalist during your hospital stay. If you have any questions about your discharge medications or the care you received while you were in the hospital after you are discharged, you can call the unit and asked to speak with the hospitalist on call if the hospitalist that took care of you is not available. Once you are discharged, your primary care physician will handle any further medical issues. Please note that NO REFILLS for any discharge medications will be authorized once you are discharged, as it is imperative that you return to your primary care physician (or establish a relationship with a primary care physician if you do not have one) for your aftercare needs so that they can reassess your need for medications and monitor your lab values. If you do not have a primary care physician, you can call [phone] for a physician referral.   Discharge wound care:   Complete  by: As directed    Wound care  Daily      Comments: Clean bilateral buttocks/sacrum with soap and water, dry. Apply Medihoney to L buttock unstageable PI wound beds (yellow tissue) and cover with dry gauze, coat remaining area sacrum/buttocks with floor stock (purple top) moisture barrier cream.  Top with ABD pad or silicone foam whichever is preferred   Increase activity slowly   Complete by: As directed          Allergies as of 12/23/2022       Reactions   Other Other (See Comments)   NO ACIDIC, TART< OR SPICY FOODS- DEVELOPS REFLUX OFTEN!! PATIENT HAS TROUBLE SWALLOWING TABLETS!!   Bactrim [sulfamethoxazole-trimethoprim] Other (See Comments)   "makes her feel funny" or "unsteady"   Amitriptyline Hcl Rash   Aspirin Hives, Swelling, Rash, Other (See Comments)   Body became swollen   Tape Other (See Comments)   SKIN IS VERY THIN- WILL TEAR EASILY!!   Zetia [ezetimibe] Rash        Medication List     TAKE these medications    acetaminophen 650 MG CR tablet Commonly known as: TYLENOL Take 650-1,300 mg by mouth 2 (two) times daily as needed for pain.   amiodarone 200 MG tablet Commonly known as: PACERONE Take 1 tablet (200 mg total) by mouth daily.   apixaban 2.5 MG Tabs tablet Commonly known as: Eliquis Take 1 tablet (2.5 mg total) by mouth 2 (two) times daily.   BENEFIBER PO Take 1 Scoop by mouth daily as needed (fiber).   cephALEXin 500 MG capsule Commonly known as: KEFLEX Take 1 capsule (500 mg total) by mouth every 8 (eight) hours for 5 days.   docusate sodium 100 MG capsule Commonly known as: COLACE Take 100 mg by mouth daily.   Ensure Take  237 mLs by mouth daily.   EYE DROPS OP Apply 2 drops to eye daily as needed (gritty eyes).   fluticasone 220 MCG/ACT inhaler Commonly known as: FLOVENT HFA Inhale into the lungs 2 (two) times daily.   hydrOXYzine 10 MG tablet Commonly known as: ATARAX Take 1 tablet (10 mg total) by mouth 3 (three) times daily  as needed for anxiety.   Linzess 72 MCG capsule Generic drug: linaclotide Take 1 capsule (72 mcg total) by mouth daily.   loratadine 10 MG tablet Commonly known as: CLARITIN Take 1 tablet (10 mg total) by mouth daily.   multivitamin with minerals Tabs tablet Take 1 tablet by mouth daily.   nitroGLYCERIN 0.4 MG SL tablet Commonly known as: NITROSTAT DISSOLVE 1 TABLET UNDER THE TONGUE EVERY 5 MINUTES AS NEEDED FOR CHEST PAIN What changed:  how much to take how to take this when to take this   ondansetron 4 MG tablet Commonly known as: Zofran Take 1 tablet (4 mg total) by mouth every 8 (eight) hours as needed for nausea or vomiting.   pantoprazole 40 MG tablet Commonly known as: PROTONIX Take 1 tablet (40 mg total) by mouth 2 (two) times daily.   rosuvastatin 20 MG tablet Commonly known as: CRESTOR TAKE 1 TABLET(20 MG) BY MOUTH DAILY What changed: See the new instructions.   Spacer/Aero-Holding [employer] Use as directed What changed:  how much to take how to take this when to take this additional instructions   Ventolin HFA 108 (90 Base) MCG/ACT inhaler Generic drug: albuterol INHALE 2 PUFFS INTO THE LUNGS EVERY 6 HOURS AS NEEDED FOR WHEEZING OR SHORTNESS OF BREATH What changed:  how much to take how to take this when to take this reasons to take this additional instructions   Vitamin D-3 25 MCG (1000 UT) Caps Take 1,000 Units by mouth daily.               Discharge Care Instructions  (From admission, onward)           Start     Ordered   12/23/22 0000  Discharge wound care:       Comments: Wound care  Daily      Comments: Clean bilateral buttocks/sacrum with soap and water, dry. Apply Medihoney to L buttock unstageable PI wound beds (yellow tissue) and cover with dry gauze, coat remaining area sacrum/buttocks with floor stock (purple top) moisture barrier cream.  Top with ABD pad or silicone foam whichever is preferred   12/23/22 0845               Follow-up Information     Ronnald Nian, MD. Schedule an appointment as soon as possible for a visit in 1 week(s).   Specialty: Family Medicine Why: post hospitalization follow up Contact information: [address] [place] [address] [phone]         Care, [facility] Follow up.   Specialty: Home Health Services Contact information: [address] [place] [address] [phone]         [facility] Follow up.   Contact information: [address] [place] [address] [phone]                TOTAL DISCHARGE TIME: 35 minutes  [name] Rito Ehrlich  [facility] Pager on [email or web address]  12/24/2022, 11:44 AM

## 2022-12-23 NOTE — Progress Notes (Signed)
Discharge instructions (including medications) discussed with and copy provided to patient/caregiver. Daughter verbalized understanding and all questions answered. PIV x 1 removed and patient assisted with dressing.

## 2022-12-23 NOTE — Plan of Care (Signed)
  Problem: Education: Goal: Knowledge of General Education information will improve Description: Including pain rating scale, medication(s)/side effects and non-pharmacologic comfort measures 12/23/2022 0616 by Jacklynn Lewis, LPN Outcome: Progressing 12/23/2022 0609 by Jacklynn Lewis, LPN Outcome: Progressing   Problem: Health Behavior/Discharge Planning: Goal: Ability to manage health-related needs will improve 12/23/2022 0616 by Jacklynn Lewis, LPN Outcome: Progressing 12/23/2022 0609 by Jacklynn Lewis, LPN Outcome: Progressing   Problem: Clinical Measurements: Goal: Ability to maintain clinical measurements within normal limits will improve 12/23/2022 0616 by Jacklynn Lewis, LPN Outcome: Progressing 12/23/2022 0609 by Jacklynn Lewis, LPN Outcome: Progressing Goal: Will remain free from infection 12/23/2022 0616 by Jacklynn Lewis, LPN Outcome: Progressing 12/23/2022 0609 by Jacklynn Lewis, LPN Outcome: Progressing Goal: Diagnostic test results will improve 12/23/2022 0616 by Jacklynn Lewis, LPN Outcome: Progressing 12/23/2022 0609 by Jacklynn Lewis, LPN Outcome: Progressing Goal: Respiratory complications will improve 12/23/2022 0616 by Jacklynn Lewis, LPN Outcome: Progressing 12/23/2022 0609 by Jacklynn Lewis, LPN Outcome: Progressing Goal: Cardiovascular complication will be avoided 12/23/2022 0616 by Jacklynn Lewis, LPN Outcome: Progressing 12/23/2022 0609 by Jacklynn Lewis, LPN Outcome: Progressing   Problem: Activity: Goal: Risk for activity intolerance will decrease 12/23/2022 0616 by Jacklynn Lewis, LPN Outcome: Progressing 12/23/2022 0609 by Jacklynn Lewis, LPN Outcome: Progressing   Problem: Nutrition: Goal: Adequate nutrition will be maintained 12/23/2022 0616 by Jacklynn Lewis, LPN Outcome: Progressing 12/23/2022 0609 by Jacklynn Lewis, LPN Outcome: Progressing   Problem: Coping: Goal: Level of anxiety will decrease 12/23/2022 0616 by Jacklynn Lewis, LPN Outcome: Progressing 12/23/2022 0609 by Jacklynn Lewis, LPN Outcome: Progressing   Problem: Elimination: Goal: Will not experience complications related to bowel motility 12/23/2022 0616 by Jacklynn Lewis, LPN Outcome: Progressing 12/23/2022 0609 by Jacklynn Lewis, LPN Outcome: Progressing Goal: Will not experience complications related to urinary retention 12/23/2022 0616 by Jacklynn Lewis, LPN Outcome: Progressing 12/23/2022 0609 by Jacklynn Lewis, LPN Outcome: Progressing   Problem: Pain Managment: Goal: General experience of comfort will improve 12/23/2022 0616 by Jacklynn Lewis, LPN Outcome: Progressing 12/23/2022 0609 by Jacklynn Lewis, LPN Outcome: Progressing   Problem: Safety: Goal: Ability to remain free from injury will improve 12/23/2022 0616 by Jacklynn Lewis, LPN Outcome: Progressing 12/23/2022 0609 by Jacklynn Lewis, LPN Outcome: Progressing   Problem: Skin Integrity: Goal: Risk for impaired skin integrity will decrease 12/23/2022 0616 by Jacklynn Lewis, LPN Outcome: Progressing 12/23/2022 0609 by Jacklynn Lewis, LPN Outcome: Progressing

## 2022-12-23 NOTE — TOC Progression Note (Addendum)
Transition of Care (TOC) - Progression Note   Spoke to patient and daughter at bedside. Patient being discharged today. Confirmed they want to continue home health services with [facility] K[facility]ee Keen aware of discharge) and Physicist, medical for palliative services ( called [place] ,with [facility]).   Daughter will arrange transport home today with [facility]. Daughter asking when to tell [facility] to pick her up. NCM sent secure chat to bedside nurse and MD  Patient Details  Name: [name] MRN: [id number] Date of Birth: May 26, 1928  Transition of [facility]) CM/SW Contact  Karem Farha, Adria Devon, RN Phone Number: 12/23/2022, 9:34 AM  Clinical Narrative:       Expected Discharge Plan: Home w Home Health Services Barriers to Discharge: Continued Medical Work up  Expected Discharge Plan and Services   Discharge Planning Services: CM Consult   Living arrangements for the past 2 months: Single Family Home Expected Discharge Date: 12/23/22                                     Social Determinants of Health (SDOH) Interventions SDOH Screenings   Food Insecurity: No Food Insecurity (09/06/2022)  Housing: Low Risk  (09/06/2022)  Transportation Needs: No Transportation Needs (09/06/2022)  Utilities: Not At Risk (09/06/2022)  Depression (PHQ2-9): Low Risk  (04/22/2022)  Financial Resource Strain: Low Risk  (04/22/2022)  Physical Activity: Inactive (04/22/2022)  Stress: No Stress Concern Present (04/22/2022)  Tobacco Use: Medium Risk (12/20/2022)    Readmission Risk Interventions    12/30/2021   11:52 AM  Readmission Risk Prevention Plan  Post Dischage Appt Complete  Medication Screening Complete  Transportation Screening Complete

## 2022-12-23 NOTE — Plan of Care (Signed)

## 2022-12-26 ENCOUNTER — Telehealth: Payer: Self-pay

## 2022-12-26 DIAGNOSIS — I110 Hypertensive heart disease with heart failure: Secondary | ICD-10-CM | POA: Diagnosis not present

## 2022-12-26 DIAGNOSIS — I5032 Chronic diastolic (congestive) heart failure: Secondary | ICD-10-CM | POA: Diagnosis not present

## 2022-12-26 DIAGNOSIS — L89152 Pressure ulcer of sacral region, stage 2: Secondary | ICD-10-CM | POA: Diagnosis not present

## 2022-12-26 DIAGNOSIS — I69398 Other sequelae of cerebral infarction: Secondary | ICD-10-CM | POA: Diagnosis not present

## 2022-12-26 DIAGNOSIS — R531 Weakness: Secondary | ICD-10-CM | POA: Diagnosis not present

## 2022-12-26 DIAGNOSIS — I2510 Atherosclerotic heart disease of native coronary artery without angina pectoris: Secondary | ICD-10-CM | POA: Diagnosis not present

## 2022-12-26 NOTE — Telephone Encounter (Signed)
April called to update on pt. Pt needs order renewal for Home Health Nursing weekly for 6 weeks. Wound dressing is now medihoney. Needs to be changed daily with either foam or duoderm. Also needs order for pressure prevention mattress. Pt is in a lot of pain in wound area because lack of cushion. Is only taking tylenol and would like to be prescribed something else. Callback number: [phone].

## 2022-12-26 NOTE — Transitions of Care (Post Inpatient/ED Visit) (Signed)
12/26/2022  Name: [name] MRN: [id number] DOB: December 26, 1928  Today's TOC FU Call Status: Today's TOC FU Call Status:: Successful TOC FU Call Completed TOC FU Call Complete Date: 12/26/22 (Call completed with daughter-[name])  Transition Care Management Follow-up Telephone Call Discharge Facility: [facility]) Type of Discharge: Inpatient Admission Primary Inpatient Discharge Diagnosis:: "acute cystitis with hematuria" How have you been since you were released from the hospital?: Better Any questions or concerns?: No  Items Reviewed: Did you receive and understand the discharge instructions provided?: Yes Medications obtained,verified, and reconciled?: Partial Review Completed Reason for Partial Mediation Review: daughter was getting ready to eat-requested brief call-review of meds Any new allergies since your discharge?: No Dietary orders reviewed?: Yes Type of Diet Ordered:: low salt/heart healthy Do you have support at home?: Yes People in Home: child(ren), adult Name of Support/Comfort Primary Source: [name]  Medications Reviewed Today: Medications Reviewed Today     Reviewed by Charlyn Minerva, RN (Registered Nurse) on 12/26/22 at 1155  Med List Status: <None>   Medication Order Taking? Sig Documenting Provider Last Dose Status Informant  acetaminophen (TYLENOL) 650 MG CR tablet [id number]  Take 650-1,300 mg by mouth 2 (two) times daily as needed for pain. [provider]  Active Child, Pharmacy Records           Med Note (WHITE, Elvin So   Fri Feb 18, 2022  7:18 AM)    amiodarone (PACERONE) 200 MG tablet [id number]  Take 1 tablet (200 mg total) by mouth daily. Regan Lemming, MD  Active Child, Pharmacy Records  apixaban [facility]) 2.5 MG TABS tablet [id number]  Take 1 tablet (2.5 mg total) by mouth 2 (two) times daily. Kathleene Hazel, MD  Active Child, Pharmacy Records  Carboxymethylcellulose Sodium (EYE DROPS OP) [id number]  Apply 2  drops to eye daily as needed (gritty eyes). [provider]  Active Child, Pharmacy Records  cephALEXin (KEFLEX) 500 MG capsule [id number] Yes Take 1 capsule (500 mg total) by mouth every 8 (eight) hours for 5 days. Osvaldo Shipper, MD Taking Active   Cholecalciferol (VITAMIN D-3) 25 MCG (1000 UT) CAPS [id number]  Take 1,000 Units by mouth daily. [provider]  Active Child, Pharmacy Records  docusate sodium (COLACE) 100 MG capsule [id number]  Take 100 mg by mouth daily. [provider]  Active Child, Pharmacy Records  Ensure [facility]) [id number]  Take 237 mLs by mouth daily. [provider]  Active Child, Pharmacy Records  fluticasone [facility]) 220 MCG/ACT inhaler [id number]  Inhale into the lungs 2 (two) times daily. [provider]  Active Child, Pharmacy Records  hydrOXYzine (ATARAX) 10 MG tablet [id number]  Take 1 tablet (10 mg total) by mouth 3 (three) times daily as needed for anxiety. Ronnald Nian, MD  Active Child, Pharmacy Records  LINZESS 72 MCG capsule [id number]  Take 1 capsule (72 mcg total) by mouth daily. Medina-Vargas, Margit Banda, NP  Active Child, Pharmacy Records           Med Note Daphine Deutscher, [place]   Tue Jan 18, 2022  2:00 PM)    loratadine (CLARITIN) 10 MG tablet [id number]  Take 1 tablet (10 mg total) by mouth daily. Medina-Vargas, Margit Banda, NP  Active Child, Pharmacy Records  Multiple Vitamin (MULTIVITAMIN WITH MINERALS) TABS tablet [id number]  Take 1 tablet by mouth daily. Kathlen Mody, MD  Active Child, Pharmacy Records  nitroGLYCERIN (NITROSTAT) 0.4 MG SL tablet [id number]  DISSOLVE 1 TABLET UNDER THE TONGUE EVERY 5  MINUTES AS NEEDED FOR CHEST PAIN  Patient taking differently: Place 0.4 mg under the tongue See admin instructions. DISSOLVE 1 TABLET UNDER THE TONGUE EVERY 5 MINUTES AS NEEDED FOR CHEST PAIN   Kathleene Hazel, MD  Active Child, Pharmacy Records  ondansetron [facility]) 4 MG tablet [id number]  Take 1 tablet (4 mg total)  by mouth every 8 (eight) hours as needed for nausea or vomiting. Ronnald Nian, MD  Active Child, Pharmacy Records  pantoprazole (PROTONIX) 40 MG tablet [id number]  Take 1 tablet (40 mg total) by mouth 2 (two) times daily. Ronnald Nian, MD  Active Child, Pharmacy Records  rosuvastatin (CRESTOR) 20 MG tablet [id number]  TAKE 1 TABLET(20 MG) BY MOUTH DAILY  Patient taking differently: Take 20 mg by mouth daily.   Ronnald Nian, MD  Active Child, Pharmacy Records  Spacer/Aero-Holding [place] [id number]  Use as directed  Patient taking differently: 1 each by Other route daily.   Ronnald Nian, MD  Active Child, Pharmacy Records  VENTOLIN HFA 108 [phone]) MCG/ACT inhaler [id number]  INHALE 2 PUFFS INTO THE LUNGS EVERY 6 HOURS AS NEEDED FOR WHEEZING OR SHORTNESS OF BREATH  Patient taking differently: Inhale 2 puffs into the lungs every 6 (six) hours as needed for wheezing or shortness of breath.   Ronnald Nian, MD  Active Child, Pharmacy Records           Med Note [facility], Elvin So   Fri Feb 18, 2022  7:22 AM)    Wheat Dextrin (BENEFIBER PO) [id number]  Take 1 Scoop by mouth daily as needed (fiber). [provider]  Active Child, Pharmacy Records            Home Care and Equipment/Supplies: Were Home Health Services Ordered?: Yes [facility] of [facility] Agency[facility] Has Agency set up a time to come to your home?: Yes First Home Health Visit Date: 12/26/22 (daughter confirms nurse coming today) Any new equipment or medical supplies ordered?: NA  Functional Questionnaire: Do you need assistance with bathing/showering or dressing?: Yes Do you need assistance with meal preparation?: Yes Do you need assistance with eating?: No Do you have difficulty maintaining continence: Yes Do you need assistance with getting out of bed/getting out of a chair/moving?: Yes Do you have difficulty managing or taking your medications?: Yes  Follow up appointments reviewed: PCP Follow-up  appointment confirmed?: Yes Date of PCP follow-up appointment?: 01/04/23 (care guide assisted with making appt-per daughter request-next week) Follow-up Provider: Dr. Susann Givens Specialist [facility] Follow-up appointment confirmed?: NA Do you need transportation to your follow-up appointment?: No (daughter is connected to medical transport agency and uses them) Do you understand care options if your condition(s) worsen?: Yes-patient verbalized understanding  SDOH Interventions Today    Flowsheet Row Most Recent Value  SDOH Interventions   Food Insecurity Interventions Intervention Not Indicated  Transportation Interventions Intervention Not Indicated  [daughter reports she uses medical transport agency to get pt to appts]      TOC Interventions Today    Flowsheet Row Most Recent Value  TOC Interventions   TOC Interventions Discussed/Reviewed TOC Interventions Discussed, Arranged PCP follow up less than 12 days/Care Guide scheduled, S/S of infection, Post discharge activity limitations per provider      Interventions Today    Flowsheet Row Most Recent Value  Chronic Disease   Chronic disease during today's visit Congestive Heart Failure (CHF), Atrial Fibrillation (AFib)  General Interventions   General Interventions Discussed/Reviewed General Interventions Discussed, Referral  to Nurse, Durable Medical Equipment (DME)  [appt scheduled with assigned RN care coordinator]  Durable Medical Equipment (DME) Other  [hosp bed-gel overlay]  Education Interventions   Education Provided Provided Education  Provided Verbal Education On Nutrition, When to see the doctor, Medication  Nutrition Interventions   Nutrition Discussed/Reviewed Nutrition Discussed  Pharmacy Interventions   Pharmacy Dicussed/Reviewed Pharmacy Topics Discussed, Medications and their functions  Safety Interventions   Safety Discussed/Reviewed Safety Discussed, Home Safety  Advanced Directive Interventions   Advanced  Directives Discussed/Reviewed End of Life  End of Life Palliative, Hospice  [daughter states that [facility] will be coming out to assess pt to see which program pt needs to enroll in-palliative care or hospice]       Antionette Fairy, RN,BSN,CCM [facility] Care Management Care Management Community Coordinator Direct Phone: [phone] Toll Free: [phone] Fax: [phone]

## 2022-12-27 NOTE — Telephone Encounter (Signed)
Had to get good callback number....area code was actually ([phone]). Called April and need number to fax order.

## 2022-12-28 DIAGNOSIS — L89152 Pressure ulcer of sacral region, stage 2: Secondary | ICD-10-CM | POA: Diagnosis not present

## 2022-12-28 DIAGNOSIS — I2510 Atherosclerotic heart disease of native coronary artery without angina pectoris: Secondary | ICD-10-CM | POA: Diagnosis not present

## 2022-12-28 DIAGNOSIS — I110 Hypertensive heart disease with heart failure: Secondary | ICD-10-CM | POA: Diagnosis not present

## 2022-12-28 DIAGNOSIS — I5032 Chronic diastolic (congestive) heart failure: Secondary | ICD-10-CM | POA: Diagnosis not present

## 2022-12-28 DIAGNOSIS — I69398 Other sequelae of cerebral infarction: Secondary | ICD-10-CM | POA: Diagnosis not present

## 2022-12-28 DIAGNOSIS — R531 Weakness: Secondary | ICD-10-CM | POA: Diagnosis not present

## 2022-12-29 DIAGNOSIS — R531 Weakness: Secondary | ICD-10-CM | POA: Diagnosis not present

## 2022-12-29 DIAGNOSIS — L89152 Pressure ulcer of sacral region, stage 2: Secondary | ICD-10-CM | POA: Diagnosis not present

## 2022-12-29 DIAGNOSIS — I110 Hypertensive heart disease with heart failure: Secondary | ICD-10-CM | POA: Diagnosis not present

## 2022-12-29 DIAGNOSIS — I2510 Atherosclerotic heart disease of native coronary artery without angina pectoris: Secondary | ICD-10-CM | POA: Diagnosis not present

## 2022-12-29 DIAGNOSIS — I5032 Chronic diastolic (congestive) heart failure: Secondary | ICD-10-CM | POA: Diagnosis not present

## 2022-12-29 DIAGNOSIS — I69398 Other sequelae of cerebral infarction: Secondary | ICD-10-CM | POA: Diagnosis not present

## 2022-12-31 DIAGNOSIS — I10 Essential (primary) hypertension: Secondary | ICD-10-CM | POA: Diagnosis not present

## 2022-12-31 DIAGNOSIS — I4891 Unspecified atrial fibrillation: Secondary | ICD-10-CM | POA: Diagnosis not present

## 2023-01-02 DIAGNOSIS — I2510 Atherosclerotic heart disease of native coronary artery without angina pectoris: Secondary | ICD-10-CM | POA: Diagnosis not present

## 2023-01-02 DIAGNOSIS — I69398 Other sequelae of cerebral infarction: Secondary | ICD-10-CM | POA: Diagnosis not present

## 2023-01-02 DIAGNOSIS — L89152 Pressure ulcer of sacral region, stage 2: Secondary | ICD-10-CM | POA: Diagnosis not present

## 2023-01-02 DIAGNOSIS — I110 Hypertensive heart disease with heart failure: Secondary | ICD-10-CM | POA: Diagnosis not present

## 2023-01-02 DIAGNOSIS — R531 Weakness: Secondary | ICD-10-CM | POA: Diagnosis not present

## 2023-01-02 DIAGNOSIS — I5032 Chronic diastolic (congestive) heart failure: Secondary | ICD-10-CM | POA: Diagnosis not present

## 2023-01-03 DIAGNOSIS — I110 Hypertensive heart disease with heart failure: Secondary | ICD-10-CM | POA: Diagnosis not present

## 2023-01-03 DIAGNOSIS — L89152 Pressure ulcer of sacral region, stage 2: Secondary | ICD-10-CM | POA: Diagnosis not present

## 2023-01-03 DIAGNOSIS — I5032 Chronic diastolic (congestive) heart failure: Secondary | ICD-10-CM | POA: Diagnosis not present

## 2023-01-03 DIAGNOSIS — R531 Weakness: Secondary | ICD-10-CM | POA: Diagnosis not present

## 2023-01-03 DIAGNOSIS — I2510 Atherosclerotic heart disease of native coronary artery without angina pectoris: Secondary | ICD-10-CM | POA: Diagnosis not present

## 2023-01-03 DIAGNOSIS — I69398 Other sequelae of cerebral infarction: Secondary | ICD-10-CM | POA: Diagnosis not present

## 2023-01-04 ENCOUNTER — Encounter: Payer: Self-pay | Admitting: Family Medicine

## 2023-01-04 ENCOUNTER — Ambulatory Visit (INDEPENDENT_AMBULATORY_CARE_PROVIDER_SITE_OTHER): Payer: Medicare Other | Admitting: Family Medicine

## 2023-01-04 ENCOUNTER — Telehealth: Payer: Self-pay | Admitting: Family Medicine

## 2023-01-04 VITALS — BP 114/58 | HR 70 | Temp 98.1°F | Resp 18

## 2023-01-04 DIAGNOSIS — G9341 Metabolic encephalopathy: Secondary | ICD-10-CM | POA: Diagnosis not present

## 2023-01-04 DIAGNOSIS — R627 Adult failure to thrive: Secondary | ICD-10-CM

## 2023-01-04 DIAGNOSIS — N3000 Acute cystitis without hematuria: Secondary | ICD-10-CM | POA: Diagnosis not present

## 2023-01-04 DIAGNOSIS — L89159 Pressure ulcer of sacral region, unspecified stage: Secondary | ICD-10-CM | POA: Diagnosis not present

## 2023-01-04 LAB — POCT URINALYSIS DIP (CLINITEK)
Bilirubin, UA: NEGATIVE
Glucose, UA: NEGATIVE mg/dL
Ketones, POC UA: NEGATIVE mg/dL
Nitrite, UA: NEGATIVE
POC PROTEIN,UA: NEGATIVE
Spec Grav, UA: 1.01 (ref 1.010–1.025)
Urobilinogen, UA: 0.2 E.U./dL
pH, UA: 7 (ref 5.0–8.0)

## 2023-01-04 MED ORDER — CEPHALEXIN 500 MG PO CAPS: 500.0000 mg | ORAL_CAPSULE | Freq: Two times a day (BID) | ORAL | 0 refills | Status: DC

## 2023-01-04 NOTE — Progress Notes (Signed)
   Subjective:    Patient ID: [name], female    DOB: 09-19-1928, 89 y.o.   MRN: [id number]  HPI She is here for posthospitalization follow-up.  She was admitted and treated for acute metabolic encephalopathy as well as a UTI.  She has been home since August 2.  She does have a pressure ulcer in the sacral area that is being cared for by her daughter and by [place].  The daughter would like a referral to hospice.  And I will continue to take care of her while she is in hospice.  Presently according to her daughter she has been fairly stable.  The daughter has concerns of a possible UTI but is vague as to what symptoms make her think that.   Review of Systems     Objective:    Physical Exam Alert and in no distress.  She offers no particular complaints.  An attempt was made to see the sacral area but could not be done.  Photo that her daughter showed did show erythema in that area but no drainage. The hospital record including discharge summary, blood work and notes was reviewed. Urinalysis showed WBCs and microscopic showed TNTC with a good clean-catch.     Assessment & Plan:   Problem List Items Addressed This Visit     Acute metabolic encephalopathy - Primary   Relevant Orders   CBC with Differential/Platelet   Comprehensive metabolic panel   UTI (urinary tract infection)   Relevant Medications   cephALEXin (KEFLEX) 500 MG capsule   Other Relevant Orders   POCT URINALYSIS DIP (CLINITEK) (Completed)   Urine Culture   Other Visit Diagnoses     Pressure injury of skin of sacral region, unspecified injury stage       Failure to thrive in adult       Relevant Orders   Ambulatory referral to Hospice     I will treat with Keflex and do another culture to be on the safe side

## 2023-01-04 NOTE — Telephone Encounter (Signed)
Dee with [facility] [phone] called She would like you to evaluate [facility] for possible Hospice placement and request you put in order of indicated  If she goes into Hospice, would you still be her attending

## 2023-01-04 NOTE — Telephone Encounter (Signed)
Patient was seen in the office today. OK per Dr. Susann Givens. Hospice order placed during office visit. I called Dee with [facility] and informed her of this.

## 2023-01-05 ENCOUNTER — Ambulatory Visit: Payer: Self-pay

## 2023-01-05 DIAGNOSIS — F0394 Unspecified dementia, unspecified severity, with anxiety: Secondary | ICD-10-CM | POA: Diagnosis not present

## 2023-01-05 DIAGNOSIS — K449 Diaphragmatic hernia without obstruction or gangrene: Secondary | ICD-10-CM | POA: Diagnosis not present

## 2023-01-05 DIAGNOSIS — G9389 Other specified disorders of brain: Secondary | ICD-10-CM | POA: Diagnosis not present

## 2023-01-05 DIAGNOSIS — E785 Hyperlipidemia, unspecified: Secondary | ICD-10-CM | POA: Diagnosis not present

## 2023-01-05 DIAGNOSIS — I739 Peripheral vascular disease, unspecified: Secondary | ICD-10-CM | POA: Diagnosis not present

## 2023-01-05 DIAGNOSIS — G9341 Metabolic encephalopathy: Secondary | ICD-10-CM | POA: Diagnosis not present

## 2023-01-05 DIAGNOSIS — M797 Fibromyalgia: Secondary | ICD-10-CM | POA: Diagnosis not present

## 2023-01-05 DIAGNOSIS — I700 Atherosclerosis of aorta: Secondary | ICD-10-CM | POA: Diagnosis not present

## 2023-01-05 DIAGNOSIS — K219 Gastro-esophageal reflux disease without esophagitis: Secondary | ICD-10-CM | POA: Diagnosis not present

## 2023-01-05 DIAGNOSIS — I5032 Chronic diastolic (congestive) heart failure: Secondary | ICD-10-CM | POA: Diagnosis not present

## 2023-01-05 DIAGNOSIS — I2510 Atherosclerotic heart disease of native coronary artery without angina pectoris: Secondary | ICD-10-CM | POA: Diagnosis not present

## 2023-01-05 DIAGNOSIS — M419 Scoliosis, unspecified: Secondary | ICD-10-CM | POA: Diagnosis not present

## 2023-01-05 DIAGNOSIS — M6281 Muscle weakness (generalized): Secondary | ICD-10-CM | POA: Diagnosis not present

## 2023-01-05 DIAGNOSIS — I110 Hypertensive heart disease with heart failure: Secondary | ICD-10-CM | POA: Diagnosis not present

## 2023-01-05 DIAGNOSIS — I4819 Other persistent atrial fibrillation: Secondary | ICD-10-CM | POA: Diagnosis not present

## 2023-01-05 DIAGNOSIS — L89152 Pressure ulcer of sacral region, stage 2: Secondary | ICD-10-CM | POA: Diagnosis not present

## 2023-01-05 DIAGNOSIS — N390 Urinary tract infection, site not specified: Secondary | ICD-10-CM | POA: Diagnosis not present

## 2023-01-05 DIAGNOSIS — F03911 Unspecified dementia, unspecified severity, with agitation: Secondary | ICD-10-CM | POA: Diagnosis not present

## 2023-01-05 DIAGNOSIS — E43 Unspecified severe protein-calorie malnutrition: Secondary | ICD-10-CM | POA: Diagnosis not present

## 2023-01-05 DIAGNOSIS — B9620 Unspecified Escherichia coli [E. coli] as the cause of diseases classified elsewhere: Secondary | ICD-10-CM | POA: Diagnosis not present

## 2023-01-05 DIAGNOSIS — J439 Emphysema, unspecified: Secondary | ICD-10-CM | POA: Diagnosis not present

## 2023-01-05 DIAGNOSIS — I69320 Aphasia following cerebral infarction: Secondary | ICD-10-CM | POA: Diagnosis not present

## 2023-01-05 DIAGNOSIS — I088 Other rheumatic multiple valve diseases: Secondary | ICD-10-CM | POA: Diagnosis not present

## 2023-01-05 DIAGNOSIS — J4489 Other specified chronic obstructive pulmonary disease: Secondary | ICD-10-CM | POA: Diagnosis not present

## 2023-01-05 DIAGNOSIS — I69398 Other sequelae of cerebral infarction: Secondary | ICD-10-CM | POA: Diagnosis not present

## 2023-01-05 LAB — CBC WITH DIFFERENTIAL/PLATELET
Basophils Absolute: 0 10*3/uL (ref 0.0–0.2)
Basos: 1 %
EOS (ABSOLUTE): 0 10*3/uL (ref 0.0–0.4)
Eos: 0 %
Hematocrit: 38.1 % (ref 34.0–46.6)
Hemoglobin: 13 g/dL (ref 11.1–15.9)
Immature Grans (Abs): 0 10*3/uL (ref 0.0–0.1)
Immature Granulocytes: 0 %
Lymphocytes Absolute: 1.2 10*3/uL (ref 0.7–3.1)
Lymphs: 16 %
MCH: 30.1 pg (ref 26.6–33.0)
MCHC: 34.1 g/dL (ref 31.5–35.7)
MCV: 88 fL (ref 79–97)
Monocytes Absolute: 0.5 10*3/uL (ref 0.1–0.9)
Monocytes: 6 %
Neutrophils Absolute: 6 10*3/uL (ref 1.4–7.0)
Neutrophils: 77 %
Platelets: 223 10*3/uL (ref 150–450)
RBC: 4.32 x10E6/uL (ref 3.77–5.28)
RDW: 14 % (ref 11.7–15.4)
WBC: 7.7 10*3/uL (ref 3.4–10.8)

## 2023-01-05 LAB — COMPREHENSIVE METABOLIC PANEL
ALT: 29 IU/L (ref 0–32)
AST: 32 IU/L (ref 0–40)
Albumin: 4 g/dL (ref 3.6–4.6)
Alkaline Phosphatase: 51 IU/L (ref 44–121)
BUN/Creatinine Ratio: 18 (ref 12–28)
BUN: 7 mg/dL — ABNORMAL LOW (ref 10–36)
Bilirubin Total: 0.5 mg/dL (ref 0.0–1.2)
CO2: 27 mmol/L (ref 20–29)
Calcium: 9.3 mg/dL (ref 8.7–10.3)
Chloride: 90 mmol/L — ABNORMAL LOW (ref 96–106)
Creatinine, Ser: 0.4 mg/dL — ABNORMAL LOW (ref 0.57–1.00)
Globulin, Total: 1.8 g/dL (ref 1.5–4.5)
Glucose: 112 mg/dL — ABNORMAL HIGH (ref 70–99)
Potassium: 3.1 mmol/L — ABNORMAL LOW (ref 3.5–5.2)
Sodium: 134 mmol/L (ref 134–144)
Total Protein: 5.8 g/dL — ABNORMAL LOW (ref 6.0–8.5)
eGFR: 93 mL/min/{1.73_m2} (ref >59)

## 2023-01-05 NOTE — Patient Outreach (Signed)
  Care Coordination   Initial Visit Note   01/05/2023 Name: [name] MRN: [id number] DOB: 1928/10/17  [name] is a 89 y.o. year old female who sees Ronnald Nian, MD for primary care. I  reviewed patient's chart today in order to contact patient/daughter for initial nurse care coordination outreach.   What matters to the patients health and wellness today?  N/a    Goals Addressed             This Visit's Progress    COMPLETED: RN Care Coordination Activities: further follow up needed       Care Coordination Interventions: Reviewed chart in preparation to contact patient/daughter for initial RN CC outreach; noted patient completed her post PCP follow up with Dr. Susann Givens on 01/04/23  Determined a referral for Hospice care was placed by Dr. Susann Givens and this referral was sent to [facility]      Interventions Today    Flowsheet Row Most Recent Value  Chronic Disease   Chronic disease during today's visit Other  [failure to thrive]  General Interventions   General Interventions Discussed/Reviewed General Interventions Reviewed, Doctor Visits, Labs  Doctor Visits Discussed/Reviewed Doctor Visits Reviewed, PCP, Specialist  Advanced Directive Interventions   End of Life Hospice         SDOH assessments and interventions completed:  No     Care Coordination Interventions:  Yes, provided   Follow up plan: No further intervention required.   Encounter Outcome:  Pt. Visit Completed

## 2023-01-06 DIAGNOSIS — I69398 Other sequelae of cerebral infarction: Secondary | ICD-10-CM | POA: Diagnosis not present

## 2023-01-06 DIAGNOSIS — M6281 Muscle weakness (generalized): Secondary | ICD-10-CM | POA: Diagnosis not present

## 2023-01-06 DIAGNOSIS — I69320 Aphasia following cerebral infarction: Secondary | ICD-10-CM | POA: Diagnosis not present

## 2023-01-06 DIAGNOSIS — N390 Urinary tract infection, site not specified: Secondary | ICD-10-CM | POA: Diagnosis not present

## 2023-01-06 DIAGNOSIS — B9620 Unspecified Escherichia coli [E. coli] as the cause of diseases classified elsewhere: Secondary | ICD-10-CM | POA: Diagnosis not present

## 2023-01-06 DIAGNOSIS — G9341 Metabolic encephalopathy: Secondary | ICD-10-CM | POA: Diagnosis not present

## 2023-01-06 LAB — URINE CULTURE

## 2023-01-08 DIAGNOSIS — I672 Cerebral atherosclerosis: Secondary | ICD-10-CM | POA: Diagnosis not present

## 2023-01-08 DIAGNOSIS — F01C4 Vascular dementia, severe, with anxiety: Secondary | ICD-10-CM | POA: Diagnosis not present

## 2023-01-09 ENCOUNTER — Telehealth: Payer: Self-pay | Admitting: *Deleted

## 2023-01-09 ENCOUNTER — Telehealth: Payer: Self-pay

## 2023-01-09 DIAGNOSIS — F01C4 Vascular dementia, severe, with anxiety: Secondary | ICD-10-CM | POA: Diagnosis not present

## 2023-01-09 DIAGNOSIS — I672 Cerebral atherosclerosis: Secondary | ICD-10-CM | POA: Diagnosis not present

## 2023-01-09 NOTE — Progress Notes (Signed)
  Care Coordination Note  01/09/2023 Name: [name] MRN: [id number] DOB: 04/04/1929  [name] is a 89 y.o. year old female who is a primary care patient of Ronnald Nian, MD and is actively engaged with the care management team. I reached out to [name] by phone today to assist with re-scheduling an initial visit with the RN Case Manager  Follow up plan: Unsuccessful telephone outreach attempt made. A HIPAA compliant phone message was left for the patient providing contact information and requesting a return call.   [facility]  Care Coordination Care Guide  Direct Dial: [phone]

## 2023-01-09 NOTE — Telephone Encounter (Signed)
Daughter wanted to run a new medication by you. Pt was prescribed a low dosage of liquid morphine. Just wants to confirm and get a second opinion.

## 2023-01-10 DIAGNOSIS — I672 Cerebral atherosclerosis: Secondary | ICD-10-CM | POA: Diagnosis not present

## 2023-01-10 DIAGNOSIS — F01C4 Vascular dementia, severe, with anxiety: Secondary | ICD-10-CM | POA: Diagnosis not present

## 2023-01-10 NOTE — Telephone Encounter (Signed)
Pt notified. No further questions at this time.

## 2023-01-11 DIAGNOSIS — F01C4 Vascular dementia, severe, with anxiety: Secondary | ICD-10-CM | POA: Diagnosis not present

## 2023-01-11 DIAGNOSIS — I672 Cerebral atherosclerosis: Secondary | ICD-10-CM | POA: Diagnosis not present

## 2023-01-12 DIAGNOSIS — I672 Cerebral atherosclerosis: Secondary | ICD-10-CM | POA: Diagnosis not present

## 2023-01-12 DIAGNOSIS — F01C4 Vascular dementia, severe, with anxiety: Secondary | ICD-10-CM | POA: Diagnosis not present

## 2023-01-13 ENCOUNTER — Telehealth: Payer: Self-pay | Admitting: Cardiovascular Disease

## 2023-01-13 DIAGNOSIS — F01C4 Vascular dementia, severe, with anxiety: Secondary | ICD-10-CM | POA: Diagnosis not present

## 2023-01-13 DIAGNOSIS — I672 Cerebral atherosclerosis: Secondary | ICD-10-CM | POA: Diagnosis not present

## 2023-01-13 NOTE — Telephone Encounter (Signed)
Spoke with the patient's daughter and advised that message will be sent to Dr. Clifton James and his nurse to make them aware. She reports that the patient has been in and out of the hospital with multiple falls. She was on palliative care but have decided to transfer her to hospice care.

## 2023-01-13 NOTE — Telephone Encounter (Signed)
New Message:      Daughter said she would like for Dr Gibson Ramp nurse to call her please. She wants them to know patient is under Hospice care at this time.

## 2023-01-14 DIAGNOSIS — I672 Cerebral atherosclerosis: Secondary | ICD-10-CM | POA: Diagnosis not present

## 2023-01-14 DIAGNOSIS — F01C4 Vascular dementia, severe, with anxiety: Secondary | ICD-10-CM | POA: Diagnosis not present

## 2023-01-15 DIAGNOSIS — F01C4 Vascular dementia, severe, with anxiety: Secondary | ICD-10-CM | POA: Diagnosis not present

## 2023-01-15 DIAGNOSIS — I672 Cerebral atherosclerosis: Secondary | ICD-10-CM | POA: Diagnosis not present

## 2023-01-16 ENCOUNTER — Other Ambulatory Visit: Payer: Self-pay | Admitting: Family Medicine

## 2023-01-16 DIAGNOSIS — R110 Nausea: Secondary | ICD-10-CM

## 2023-01-16 DIAGNOSIS — F01C4 Vascular dementia, severe, with anxiety: Secondary | ICD-10-CM | POA: Diagnosis not present

## 2023-01-16 DIAGNOSIS — I672 Cerebral atherosclerosis: Secondary | ICD-10-CM | POA: Diagnosis not present

## 2023-01-16 NOTE — Progress Notes (Signed)
  Care Coordination Note  01/16/2023 Name: [name] MRN: [id number] DOB: [birth year]  [name] is a 89 y.o. year old female who is a primary care patient of Ronnald Nian, MD and is actively engaged with the care management team. I reached out to [name] by phone today to assist with re-scheduling a follow up visit with the RN Case Manager  Follow up plan: Unsuccessful telephone outreach attempt made. A HIPAA compliant phone message was left for the patient providing contact information and requesting a return call.  We have been unable to make contact with the patient for follow up. The care management team is available to follow up with the patient after provider conversation with the patient regarding recommendation for care management engagement and subsequent re-referral to the care management team.   [facility] Coordination Care Guide  Direct Dial: [phone]

## 2023-01-17 ENCOUNTER — Ambulatory Visit: Payer: Medicare Other | Admitting: Cardiology

## 2023-01-17 ENCOUNTER — Ambulatory Visit: Payer: Medicare Other

## 2023-01-17 DIAGNOSIS — I672 Cerebral atherosclerosis: Secondary | ICD-10-CM | POA: Diagnosis not present

## 2023-01-17 DIAGNOSIS — I4819 Other persistent atrial fibrillation: Secondary | ICD-10-CM

## 2023-01-17 DIAGNOSIS — F01C4 Vascular dementia, severe, with anxiety: Secondary | ICD-10-CM | POA: Diagnosis not present

## 2023-01-17 DIAGNOSIS — I495 Sick sinus syndrome: Secondary | ICD-10-CM

## 2023-01-17 LAB — CUP PACEART REMOTE DEVICE CHECK
Battery Remaining Longevity: 78 mo
Battery Remaining Percentage: 79 %
Battery Voltage: 2.99 V
Brady Statistic AP VP Percent: 28 %
Brady Statistic AP VS Percent: 1 %
Brady Statistic AS VP Percent: 72 %
Brady Statistic AS VS Percent: 1 %
Brady Statistic RA Percent Paced: 25 %
Brady Statistic RV Percent Paced: 99 %
Date Time Interrogation Session: 20240827020014
Implantable Lead Connection Status: 753985
Implantable Lead Connection Status: 753985
Implantable Lead Implant Date: 20220826
Implantable Lead Implant Date: 20220826
Implantable Lead Location: 753859
Implantable Lead Location: 753860
Implantable Pulse Generator Implant Date: 20220826
Lead Channel Impedance Value: 410 Ohm
Lead Channel Impedance Value: 480 Ohm
Lead Channel Pacing Threshold Amplitude: 1 V
Lead Channel Pacing Threshold Amplitude: 1 V
Lead Channel Pacing Threshold Pulse Width: 0.5 ms
Lead Channel Pacing Threshold Pulse Width: 0.6 ms
Lead Channel Sensing Intrinsic Amplitude: 12 mV
Lead Channel Sensing Intrinsic Amplitude: 4.7 mV
Lead Channel Setting Pacing Amplitude: 2 V
Lead Channel Setting Pacing Amplitude: 2.5 V
Lead Channel Setting Pacing Pulse Width: 0.5 ms
Lead Channel Setting Sensing Sensitivity: 2 mV
Pulse Gen Model: 2272
Pulse Gen Serial Number: 3953466

## 2023-01-18 DIAGNOSIS — F01C4 Vascular dementia, severe, with anxiety: Secondary | ICD-10-CM | POA: Diagnosis not present

## 2023-01-18 DIAGNOSIS — I672 Cerebral atherosclerosis: Secondary | ICD-10-CM | POA: Diagnosis not present

## 2023-01-19 DIAGNOSIS — F01C4 Vascular dementia, severe, with anxiety: Secondary | ICD-10-CM | POA: Diagnosis not present

## 2023-01-19 DIAGNOSIS — I672 Cerebral atherosclerosis: Secondary | ICD-10-CM | POA: Diagnosis not present

## 2023-01-20 DIAGNOSIS — F01C4 Vascular dementia, severe, with anxiety: Secondary | ICD-10-CM | POA: Diagnosis not present

## 2023-01-20 DIAGNOSIS — I672 Cerebral atherosclerosis: Secondary | ICD-10-CM | POA: Diagnosis not present

## 2023-01-21 DIAGNOSIS — F01C4 Vascular dementia, severe, with anxiety: Secondary | ICD-10-CM | POA: Diagnosis not present

## 2023-01-21 DIAGNOSIS — I672 Cerebral atherosclerosis: Secondary | ICD-10-CM | POA: Diagnosis not present

## 2023-01-22 DIAGNOSIS — F01C4 Vascular dementia, severe, with anxiety: Secondary | ICD-10-CM | POA: Diagnosis not present

## 2023-01-22 DIAGNOSIS — I672 Cerebral atherosclerosis: Secondary | ICD-10-CM | POA: Diagnosis not present

## 2023-01-23 DIAGNOSIS — F01C4 Vascular dementia, severe, with anxiety: Secondary | ICD-10-CM | POA: Diagnosis not present

## 2023-01-23 DIAGNOSIS — I672 Cerebral atherosclerosis: Secondary | ICD-10-CM | POA: Diagnosis not present

## 2023-01-24 DIAGNOSIS — I672 Cerebral atherosclerosis: Secondary | ICD-10-CM | POA: Diagnosis not present

## 2023-01-24 DIAGNOSIS — F01C4 Vascular dementia, severe, with anxiety: Secondary | ICD-10-CM | POA: Diagnosis not present

## 2023-01-25 DIAGNOSIS — I672 Cerebral atherosclerosis: Secondary | ICD-10-CM | POA: Diagnosis not present

## 2023-01-25 DIAGNOSIS — F01C4 Vascular dementia, severe, with anxiety: Secondary | ICD-10-CM | POA: Diagnosis not present

## 2023-01-25 NOTE — Progress Notes (Signed)
Remote pacemaker transmission.   

## 2023-01-26 DIAGNOSIS — F01C4 Vascular dementia, severe, with anxiety: Secondary | ICD-10-CM | POA: Diagnosis not present

## 2023-01-26 DIAGNOSIS — I672 Cerebral atherosclerosis: Secondary | ICD-10-CM | POA: Diagnosis not present

## 2023-01-27 DIAGNOSIS — F01C4 Vascular dementia, severe, with anxiety: Secondary | ICD-10-CM | POA: Diagnosis not present

## 2023-01-27 DIAGNOSIS — I672 Cerebral atherosclerosis: Secondary | ICD-10-CM | POA: Diagnosis not present

## 2023-01-28 DIAGNOSIS — I672 Cerebral atherosclerosis: Secondary | ICD-10-CM | POA: Diagnosis not present

## 2023-01-28 DIAGNOSIS — F01C4 Vascular dementia, severe, with anxiety: Secondary | ICD-10-CM | POA: Diagnosis not present

## 2023-01-29 DIAGNOSIS — I672 Cerebral atherosclerosis: Secondary | ICD-10-CM | POA: Diagnosis not present

## 2023-01-29 DIAGNOSIS — F01C4 Vascular dementia, severe, with anxiety: Secondary | ICD-10-CM | POA: Diagnosis not present

## 2023-01-30 DIAGNOSIS — F01C4 Vascular dementia, severe, with anxiety: Secondary | ICD-10-CM | POA: Diagnosis not present

## 2023-01-30 DIAGNOSIS — I672 Cerebral atherosclerosis: Secondary | ICD-10-CM | POA: Diagnosis not present

## 2023-01-31 DIAGNOSIS — F01C4 Vascular dementia, severe, with anxiety: Secondary | ICD-10-CM | POA: Diagnosis not present

## 2023-01-31 DIAGNOSIS — I672 Cerebral atherosclerosis: Secondary | ICD-10-CM | POA: Diagnosis not present

## 2023-02-01 DIAGNOSIS — F01C4 Vascular dementia, severe, with anxiety: Secondary | ICD-10-CM | POA: Diagnosis not present

## 2023-02-01 DIAGNOSIS — I672 Cerebral atherosclerosis: Secondary | ICD-10-CM | POA: Diagnosis not present

## 2023-02-02 DIAGNOSIS — F01C4 Vascular dementia, severe, with anxiety: Secondary | ICD-10-CM | POA: Diagnosis not present

## 2023-02-02 DIAGNOSIS — I672 Cerebral atherosclerosis: Secondary | ICD-10-CM | POA: Diagnosis not present

## 2023-02-03 DIAGNOSIS — F01C4 Vascular dementia, severe, with anxiety: Secondary | ICD-10-CM | POA: Diagnosis not present

## 2023-02-03 DIAGNOSIS — I672 Cerebral atherosclerosis: Secondary | ICD-10-CM | POA: Diagnosis not present

## 2023-02-04 DIAGNOSIS — F01C4 Vascular dementia, severe, with anxiety: Secondary | ICD-10-CM | POA: Diagnosis not present

## 2023-02-04 DIAGNOSIS — I672 Cerebral atherosclerosis: Secondary | ICD-10-CM | POA: Diagnosis not present

## 2023-02-05 DIAGNOSIS — F01C4 Vascular dementia, severe, with anxiety: Secondary | ICD-10-CM | POA: Diagnosis not present

## 2023-02-05 DIAGNOSIS — I672 Cerebral atherosclerosis: Secondary | ICD-10-CM | POA: Diagnosis not present

## 2023-02-06 DIAGNOSIS — I672 Cerebral atherosclerosis: Secondary | ICD-10-CM | POA: Diagnosis not present

## 2023-02-06 DIAGNOSIS — F01C4 Vascular dementia, severe, with anxiety: Secondary | ICD-10-CM | POA: Diagnosis not present

## 2023-02-07 DIAGNOSIS — I672 Cerebral atherosclerosis: Secondary | ICD-10-CM | POA: Diagnosis not present

## 2023-02-07 DIAGNOSIS — F01C4 Vascular dementia, severe, with anxiety: Secondary | ICD-10-CM | POA: Diagnosis not present

## 2023-02-08 DIAGNOSIS — I672 Cerebral atherosclerosis: Secondary | ICD-10-CM | POA: Diagnosis not present

## 2023-02-08 DIAGNOSIS — F01C4 Vascular dementia, severe, with anxiety: Secondary | ICD-10-CM | POA: Diagnosis not present

## 2023-02-09 DIAGNOSIS — F01C4 Vascular dementia, severe, with anxiety: Secondary | ICD-10-CM | POA: Diagnosis not present

## 2023-02-09 DIAGNOSIS — I672 Cerebral atherosclerosis: Secondary | ICD-10-CM | POA: Diagnosis not present

## 2023-02-10 ENCOUNTER — Other Ambulatory Visit: Payer: Self-pay | Admitting: Cardiovascular Disease

## 2023-02-10 DIAGNOSIS — I672 Cerebral atherosclerosis: Secondary | ICD-10-CM | POA: Diagnosis not present

## 2023-02-10 DIAGNOSIS — F01C4 Vascular dementia, severe, with anxiety: Secondary | ICD-10-CM | POA: Diagnosis not present

## 2023-02-11 DIAGNOSIS — I672 Cerebral atherosclerosis: Secondary | ICD-10-CM | POA: Diagnosis not present

## 2023-02-11 DIAGNOSIS — F01C4 Vascular dementia, severe, with anxiety: Secondary | ICD-10-CM | POA: Diagnosis not present

## 2023-02-12 DIAGNOSIS — F01C4 Vascular dementia, severe, with anxiety: Secondary | ICD-10-CM | POA: Diagnosis not present

## 2023-02-12 DIAGNOSIS — I672 Cerebral atherosclerosis: Secondary | ICD-10-CM | POA: Diagnosis not present

## 2023-02-13 DIAGNOSIS — I672 Cerebral atherosclerosis: Secondary | ICD-10-CM | POA: Diagnosis not present

## 2023-02-13 DIAGNOSIS — F01C4 Vascular dementia, severe, with anxiety: Secondary | ICD-10-CM | POA: Diagnosis not present

## 2023-02-14 DIAGNOSIS — I672 Cerebral atherosclerosis: Secondary | ICD-10-CM | POA: Diagnosis not present

## 2023-02-14 DIAGNOSIS — F01C4 Vascular dementia, severe, with anxiety: Secondary | ICD-10-CM | POA: Diagnosis not present

## 2023-02-15 ENCOUNTER — Ambulatory Visit: Payer: Medicare Other | Admitting: Cardiology

## 2023-02-15 DIAGNOSIS — F01C4 Vascular dementia, severe, with anxiety: Secondary | ICD-10-CM | POA: Diagnosis not present

## 2023-02-15 DIAGNOSIS — I672 Cerebral atherosclerosis: Secondary | ICD-10-CM | POA: Diagnosis not present

## 2023-02-16 DIAGNOSIS — I672 Cerebral atherosclerosis: Secondary | ICD-10-CM | POA: Diagnosis not present

## 2023-02-16 DIAGNOSIS — F01C4 Vascular dementia, severe, with anxiety: Secondary | ICD-10-CM | POA: Diagnosis not present

## 2023-02-17 DIAGNOSIS — F01C4 Vascular dementia, severe, with anxiety: Secondary | ICD-10-CM | POA: Diagnosis not present

## 2023-02-17 DIAGNOSIS — I672 Cerebral atherosclerosis: Secondary | ICD-10-CM | POA: Diagnosis not present

## 2023-02-18 DIAGNOSIS — I672 Cerebral atherosclerosis: Secondary | ICD-10-CM | POA: Diagnosis not present

## 2023-02-18 DIAGNOSIS — F01C4 Vascular dementia, severe, with anxiety: Secondary | ICD-10-CM | POA: Diagnosis not present

## 2023-02-19 DIAGNOSIS — I672 Cerebral atherosclerosis: Secondary | ICD-10-CM | POA: Diagnosis not present

## 2023-02-19 DIAGNOSIS — F01C4 Vascular dementia, severe, with anxiety: Secondary | ICD-10-CM | POA: Diagnosis not present

## 2023-02-20 DIAGNOSIS — F01C4 Vascular dementia, severe, with anxiety: Secondary | ICD-10-CM | POA: Diagnosis not present

## 2023-02-20 DIAGNOSIS — I672 Cerebral atherosclerosis: Secondary | ICD-10-CM | POA: Diagnosis not present

## 2023-02-21 DIAGNOSIS — F01C4 Vascular dementia, severe, with anxiety: Secondary | ICD-10-CM | POA: Diagnosis not present

## 2023-02-21 DIAGNOSIS — I672 Cerebral atherosclerosis: Secondary | ICD-10-CM | POA: Diagnosis not present

## 2023-02-22 DIAGNOSIS — I672 Cerebral atherosclerosis: Secondary | ICD-10-CM | POA: Diagnosis not present

## 2023-02-22 DIAGNOSIS — F01C4 Vascular dementia, severe, with anxiety: Secondary | ICD-10-CM | POA: Diagnosis not present

## 2023-02-23 DIAGNOSIS — I672 Cerebral atherosclerosis: Secondary | ICD-10-CM | POA: Diagnosis not present

## 2023-02-23 DIAGNOSIS — F01C4 Vascular dementia, severe, with anxiety: Secondary | ICD-10-CM | POA: Diagnosis not present

## 2023-02-24 DIAGNOSIS — I672 Cerebral atherosclerosis: Secondary | ICD-10-CM | POA: Diagnosis not present

## 2023-02-24 DIAGNOSIS — F01C4 Vascular dementia, severe, with anxiety: Secondary | ICD-10-CM | POA: Diagnosis not present

## 2023-02-25 DIAGNOSIS — I672 Cerebral atherosclerosis: Secondary | ICD-10-CM | POA: Diagnosis not present

## 2023-02-25 DIAGNOSIS — F01C4 Vascular dementia, severe, with anxiety: Secondary | ICD-10-CM | POA: Diagnosis not present

## 2023-02-26 DIAGNOSIS — I672 Cerebral atherosclerosis: Secondary | ICD-10-CM | POA: Diagnosis not present

## 2023-02-26 DIAGNOSIS — F01C4 Vascular dementia, severe, with anxiety: Secondary | ICD-10-CM | POA: Diagnosis not present

## 2023-02-27 DIAGNOSIS — I672 Cerebral atherosclerosis: Secondary | ICD-10-CM | POA: Diagnosis not present

## 2023-02-27 DIAGNOSIS — F01C4 Vascular dementia, severe, with anxiety: Secondary | ICD-10-CM | POA: Diagnosis not present

## 2023-02-28 DIAGNOSIS — I672 Cerebral atherosclerosis: Secondary | ICD-10-CM | POA: Diagnosis not present

## 2023-02-28 DIAGNOSIS — F01C4 Vascular dementia, severe, with anxiety: Secondary | ICD-10-CM | POA: Diagnosis not present

## 2023-03-01 DIAGNOSIS — F01C4 Vascular dementia, severe, with anxiety: Secondary | ICD-10-CM | POA: Diagnosis not present

## 2023-03-01 DIAGNOSIS — I672 Cerebral atherosclerosis: Secondary | ICD-10-CM | POA: Diagnosis not present

## 2023-03-02 DIAGNOSIS — F01C4 Vascular dementia, severe, with anxiety: Secondary | ICD-10-CM | POA: Diagnosis not present

## 2023-03-02 DIAGNOSIS — I672 Cerebral atherosclerosis: Secondary | ICD-10-CM | POA: Diagnosis not present

## 2023-03-03 DIAGNOSIS — F01C4 Vascular dementia, severe, with anxiety: Secondary | ICD-10-CM | POA: Diagnosis not present

## 2023-03-03 DIAGNOSIS — I672 Cerebral atherosclerosis: Secondary | ICD-10-CM | POA: Diagnosis not present

## 2023-03-07 ENCOUNTER — Telehealth: Payer: Self-pay | Admitting: Family Medicine

## 2023-03-07 NOTE — Telephone Encounter (Signed)
Sympathy card sent 

## 2023-03-09 ENCOUNTER — Telehealth: Payer: Self-pay | Admitting: Family Medicine

## 2023-03-09 NOTE — Telephone Encounter (Signed)
[name] called to let you know, [name] passed away this past Mar 24, 2023 03/10/2023 and that she isn't doing well but is trying.

## 2023-03-14 NOTE — Telephone Encounter (Signed)
[name] (daughter) [phone] no answer, no voice mail.

## 2023-03-24 ENCOUNTER — Telehealth: Payer: Self-pay

## 2023-03-24 NOTE — Telephone Encounter (Signed)
Pt's daughter need some clarity on her cause of death because she is struggling with the loss. States her cause of death was cerebrovascular disease. Wants to know if that was because she had a stroke previously. States her mom went downhill quickly. Asked for you to call her back to shed some light on her death.

## 2023-03-24 DEATH — deceased

## 2023-04-18 ENCOUNTER — Ambulatory Visit: Payer: Medicare Other

## 2023-07-18 ENCOUNTER — Ambulatory Visit: Payer: Medicare Other
